# Patient Record
Sex: Female | Born: 1955 | Race: White | Hispanic: No | Marital: Single | Smoking: Current every day smoker
Health system: Southern US, Community
[De-identification: ages and names within clinical notes are randomized; demographics above are authoritative.]

## PROBLEM LIST (undated history)

## (undated) DIAGNOSIS — E079 Disorder of thyroid, unspecified: Secondary | ICD-10-CM

## (undated) DIAGNOSIS — R42 Dizziness and giddiness: Secondary | ICD-10-CM

## (undated) DIAGNOSIS — N200 Calculus of kidney: Secondary | ICD-10-CM

## (undated) DIAGNOSIS — Z789 Other specified health status: Secondary | ICD-10-CM

## (undated) DIAGNOSIS — F419 Anxiety disorder, unspecified: Secondary | ICD-10-CM

## (undated) HISTORY — DX: Disorder of thyroid, unspecified: E07.9

---

## 1979-03-07 HISTORY — PX: AUGMENTATION MAMMAPLASTY: SUR837

## 2004-03-06 HISTORY — PX: LAPAROSCOPIC TOTAL HYSTERECTOMY: SUR800

## 2005-03-06 HISTORY — PX: BREAST IMPLANT REMOVAL: SUR1101

## 2008-03-06 HISTORY — PX: CHOLECYSTECTOMY: SHX55

## 2017-10-29 ENCOUNTER — Other Ambulatory Visit: Payer: Self-pay | Admitting: Family

## 2017-10-29 DIAGNOSIS — Z1231 Encounter for screening mammogram for malignant neoplasm of breast: Secondary | ICD-10-CM

## 2017-11-15 ENCOUNTER — Ambulatory Visit
Admission: RE | Admit: 2017-11-15 | Discharge: 2017-11-15 | Disposition: A | Discharge status: Home / Self Care | Payer: BLUE CROSS/BLUE SHIELD | Source: Ambulatory Visit | Attending: Family | Admitting: Family

## 2017-11-15 DIAGNOSIS — Z1231 Encounter for screening mammogram for malignant neoplasm of breast: Secondary | ICD-10-CM

## 2017-11-22 ENCOUNTER — Other Ambulatory Visit: Payer: Self-pay | Admitting: Family

## 2017-11-22 DIAGNOSIS — R928 Other abnormal and inconclusive findings on diagnostic imaging of breast: Secondary | ICD-10-CM

## 2017-12-06 ENCOUNTER — Ambulatory Visit
Admission: RE | Admit: 2017-12-06 | Discharge: 2017-12-06 | Disposition: A | Discharge status: Home / Self Care | Payer: BLUE CROSS/BLUE SHIELD | Source: Ambulatory Visit | Attending: Family | Admitting: Family

## 2017-12-06 DIAGNOSIS — R928 Other abnormal and inconclusive findings on diagnostic imaging of breast: Secondary | ICD-10-CM

## 2018-11-03 IMAGING — US US BREAST*L* LIMITED INC AXILLA
1 series · 2 of 2 positions shown · non-contrast
Comparison: Previous exam(s).

CLINICAL DATA: Patient recalled from screening for bilateral
distortion. Note patient had remote history of bilateral silicone
implants which ruptured and were subsequently removed. Patient does
not have any outside prior images for comparison.

EXAM:
DIGITAL DIAGNOSTIC BILATERAL MAMMOGRAM WITH CAD
ULTRASOUND BILATERAL BREAST

[Series 1: us breast*left* limited inc axilla · 0.07mm/px · 2 of 2 slices shown]
[im 1/2]
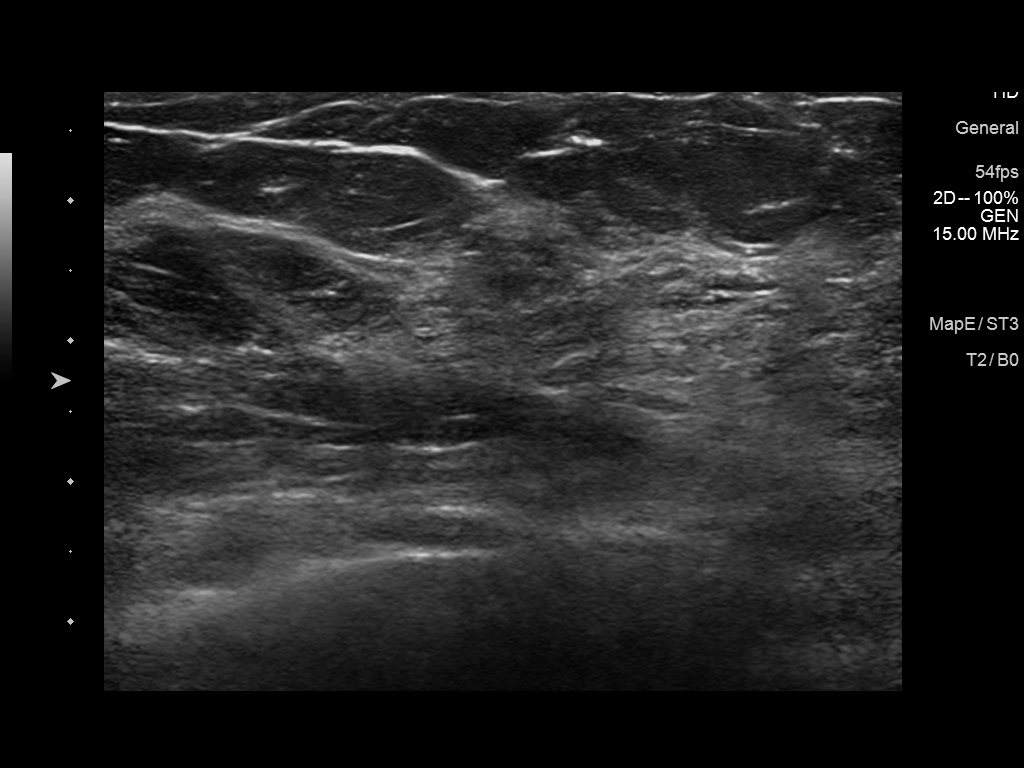
[im 2/2]
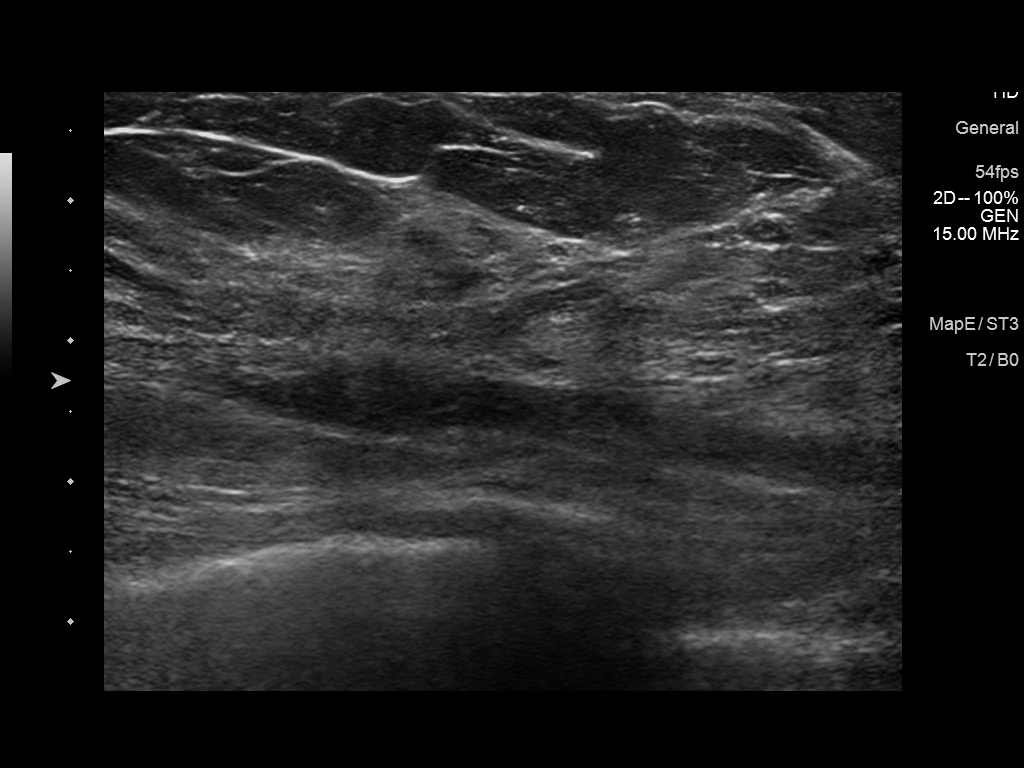

[2 of 2 positions shown; findings below may reference images not displayed]

ACR Breast Density Category c: The breast tissue is heterogeneously
dense, which may obscure small masses.
FINDINGS: Additional imaging demonstrates persistent distortion within the
central posterior right breast. Multiple high-density circumscribed
masses are demonstrated within this focal area of distortion.

Within the central posterior left breast there is persistent
distortion. There are multiple tubular high density masses within
the posterior aspect of the left breast.

Mammographic images were processed with CAD.

Targeted ultrasound is performed, showing distortion within the
right breast 12 o'clock position 2 cm from nipple. There are oval
circumscribed hypoechoic masses located centrally within the area of
distortion. Additionally, there are scattered areas of increased
echogenicity compatible with silicone.

Within the left breast 2 o'clock position there is small amount of
sonographically identified distortion. Throughout the area there is
increased echogenicity compatible with silicone.
IMPRESSION: Complex findings within the right and left breast favored to
represent a combination of postsurgical changes and residual
extracapsular silicone.

RECOMMENDATION:
Given the complexity of the imaging appearance of both the right and
left breast, history of silicone rupture and postsurgical changes,
recommend bilateral diagnostic mammography in 6 months to ensure
stability of these findings. Today's exam will serve as a baseline
and if there are any concerning changes, these can be acted on at
the time the six-month follow-up.

I have discussed the findings and recommendations with the patient.
Results were also provided in writing at the conclusion of the
visit. If applicable, a reminder letter will be sent to the patient
regarding the next appointment.

BI-RADS CATEGORY  3: Probably benign.

## 2018-11-03 IMAGING — US US BREAST*R* LIMITED INC AXILLA
1 series · 2 of 2 positions shown · non-contrast
Comparison: Previous exam(s).

CLINICAL DATA: Patient recalled from screening for bilateral
distortion. Note patient had remote history of bilateral silicone
implants which ruptured and were subsequently removed. Patient does
not have any outside prior images for comparison.

EXAM:
DIGITAL DIAGNOSTIC BILATERAL MAMMOGRAM WITH CAD
ULTRASOUND BILATERAL BREAST

[Series 1: us breast*right* limited inc axilla · 0.09mm/px · 2 of 2 slices shown]
[im 1/2]
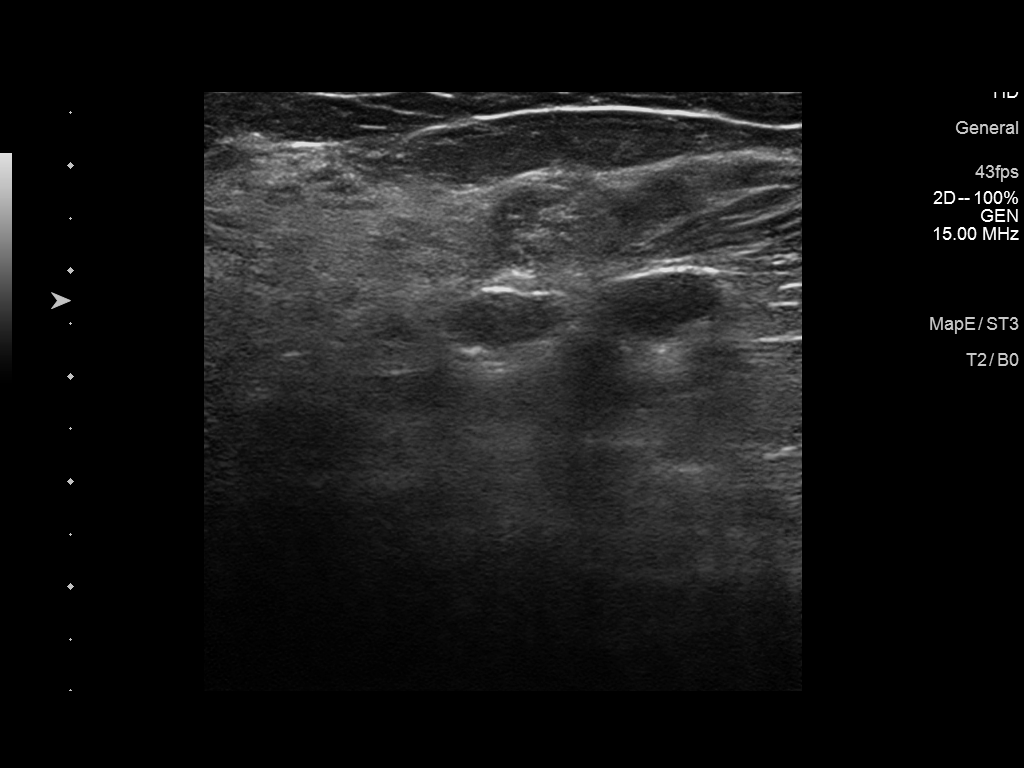
[im 2/2]
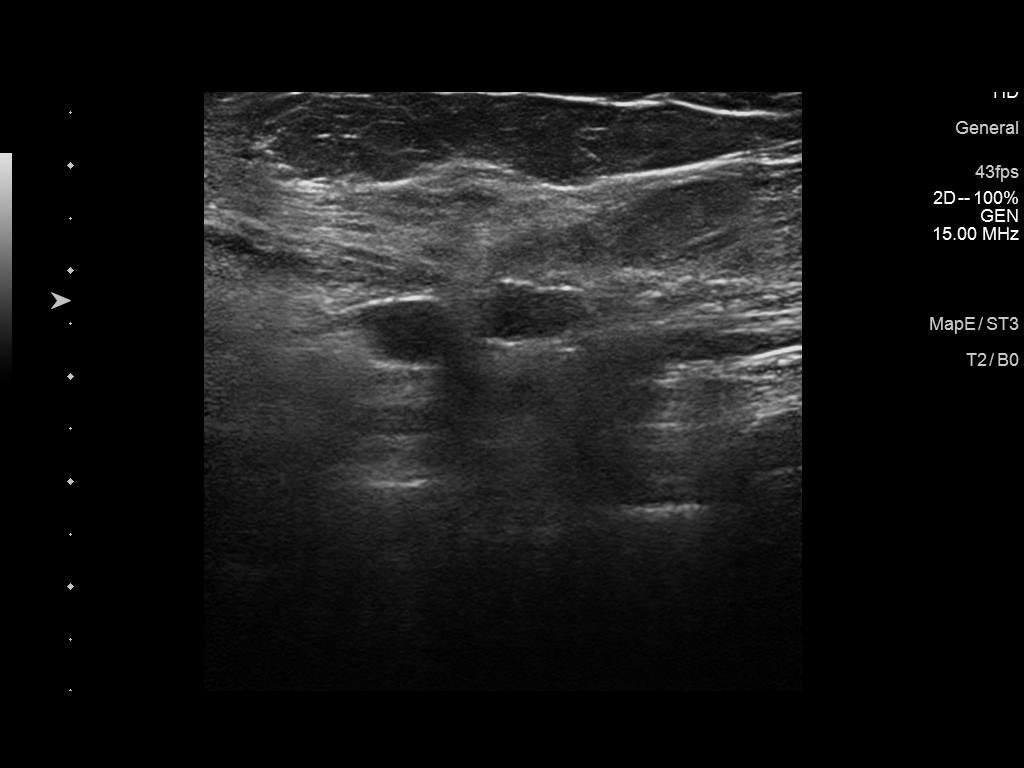

[2 of 2 positions shown; findings below may reference images not displayed]

ACR Breast Density Category c: The breast tissue is heterogeneously
dense, which may obscure small masses.
FINDINGS: Additional imaging demonstrates persistent distortion within the
central posterior right breast. Multiple high-density circumscribed
masses are demonstrated within this focal area of distortion.

Within the central posterior left breast there is persistent
distortion. There are multiple tubular high density masses within
the posterior aspect of the left breast.

Mammographic images were processed with CAD.

Targeted ultrasound is performed, showing distortion within the
right breast 12 o'clock position 2 cm from nipple. There are oval
circumscribed hypoechoic masses located centrally within the area of
distortion. Additionally, there are scattered areas of increased
echogenicity compatible with silicone.

Within the left breast 2 o'clock position there is small amount of
sonographically identified distortion. Throughout the area there is
increased echogenicity compatible with silicone.
IMPRESSION: Complex findings within the right and left breast favored to
represent a combination of postsurgical changes and residual
extracapsular silicone.

RECOMMENDATION:
Given the complexity of the imaging appearance of both the right and
left breast, history of silicone rupture and postsurgical changes,
recommend bilateral diagnostic mammography in 6 months to ensure
stability of these findings. Today's exam will serve as a baseline
and if there are any concerning changes, these can be acted on at
the time the six-month follow-up.

I have discussed the findings and recommendations with the patient.
Results were also provided in writing at the conclusion of the
visit. If applicable, a reminder letter will be sent to the patient
regarding the next appointment.

BI-RADS CATEGORY  3: Probably benign.

## 2019-07-04 ENCOUNTER — Other Ambulatory Visit: Payer: Self-pay | Admitting: Family

## 2019-07-04 DIAGNOSIS — Z1231 Encounter for screening mammogram for malignant neoplasm of breast: Secondary | ICD-10-CM

## 2019-07-04 DIAGNOSIS — R928 Other abnormal and inconclusive findings on diagnostic imaging of breast: Secondary | ICD-10-CM

## 2019-07-04 DIAGNOSIS — N6489 Other specified disorders of breast: Secondary | ICD-10-CM

## 2021-03-11 ENCOUNTER — Ambulatory Visit: Payer: BLUE CROSS/BLUE SHIELD | Admitting: Nurse Practitioner

## 2021-03-17 ENCOUNTER — Telehealth: Payer: Self-pay

## 2021-03-17 NOTE — Telephone Encounter (Signed)
Left vm and sent mychart message to confirm 03/18/21 appointment-Toni

## 2021-03-18 ENCOUNTER — Other Ambulatory Visit: Payer: Self-pay

## 2021-03-18 ENCOUNTER — Ambulatory Visit (INDEPENDENT_AMBULATORY_CARE_PROVIDER_SITE_OTHER): Payer: Medicare Other | Admitting: Physician Assistant

## 2021-03-18 ENCOUNTER — Encounter: Payer: Self-pay | Admitting: Physician Assistant

## 2021-03-18 DIAGNOSIS — G4700 Insomnia, unspecified: Secondary | ICD-10-CM | POA: Diagnosis not present

## 2021-03-18 DIAGNOSIS — R5383 Other fatigue: Secondary | ICD-10-CM

## 2021-03-18 DIAGNOSIS — Z7689 Persons encountering health services in other specified circumstances: Secondary | ICD-10-CM

## 2021-03-18 DIAGNOSIS — F411 Generalized anxiety disorder: Secondary | ICD-10-CM

## 2021-03-18 DIAGNOSIS — Z1231 Encounter for screening mammogram for malignant neoplasm of breast: Secondary | ICD-10-CM

## 2021-03-18 DIAGNOSIS — E2839 Other primary ovarian failure: Secondary | ICD-10-CM

## 2021-03-18 DIAGNOSIS — R7989 Other specified abnormal findings of blood chemistry: Secondary | ICD-10-CM

## 2021-03-18 DIAGNOSIS — E559 Vitamin D deficiency, unspecified: Secondary | ICD-10-CM

## 2021-03-18 NOTE — Progress Notes (Signed)
[facility] [place], [place] [address]  Internal MEDICINE  Office Visit Note  Patient Name: Tracy Baker  [id number]  [id number]  Date of Service: 03/22/2021   Complaints/HPI Pt is here for establishment of PCP. Chief Complaint  Patient presents with   New Patient (Initial Visit)   Thyroid Problem    Is concerned that thyroid is causing weight gain   HPI Pt is here to establish care -Last PCP about 2 yrs ago -Hx of thyroid being "off" -Has been gaining weight, fatigued, and has some anxiety. Has been on klonopin previously and only would take as needed. Took effexor previously and stopepd around 2008. Once per month has these anxiety spells and feels she is managing. -Sleep has been poor, sometimes does nyquil or klonopin -Has done melatonin gummies but took right before bed and was groggy next morning--may try taking a few hours prior to bed.  -Works at [employer] and is on her feet, does this parttime. Spends time with grandduaghter on weekends, Has a puppy as well.  -She has 2 daughters and 1 son that check up on her frequently -Never had a colonoscopy, report she does have an uncle that had colon cancer, she is not interested in a referral for this at this time and will discuss again in future -Had a total hysterectomy, and her last mammogram was 2 yrs ago -She does smoke 1ppd  Current Medication: No outpatient encounter medications on file as of 03/18/2021.   No facility-administered encounter medications on file as of 03/18/2021.    Surgical History: Past Surgical History:  Procedure Laterality Date   AUGMENTATION MAMMAPLASTY Bilateral 1981   removed in 2006   BREAST IMPLANT REMOVAL Bilateral 2007   CHOLECYSTECTOMY  2010   LAPAROSCOPIC TOTAL HYSTERECTOMY  2006    Medical History: Past Medical History:  Diagnosis Date   Thyroid disease     Family History: Family History  Problem Relation Age of Onset   Hypertension Mother     Diabetes Father    Breast cancer Neg Hx     Social History   Socioeconomic History   Marital status: Single    Spouse name: Not on file   Number of children: Not on file   Years of education: Not on file   Highest education level: Not on file  Occupational History   Not on file  Tobacco Use   Smoking status: Every Day    Packs/day: 1.00    Types: Cigarettes   Smokeless tobacco: Not on file   Tobacco comments:    About 15-20 cigarettes a day  Substance and Sexual Activity   Alcohol use: Yes   Drug use: Never   Sexual activity: Not Currently  Other Topics Concern   Not on file  Social History Narrative   Not on file   Social Determinants of Health   Financial Resource Strain: Not on file  Food Insecurity: Not on file  Transportation Needs: Not on file  Physical Activity: Not on file  Stress: Not on file  Social Connections: Not on file  Intimate Partner Violence: Not on file     Review of Systems  Constitutional:  Positive for fatigue and unexpected weight change. Negative for chills.  HENT:  Negative for congestion, postnasal drip, rhinorrhea, sneezing and sore throat.   Eyes:  Negative for redness.  Respiratory:  Negative for cough, chest tightness and shortness of breath.   Cardiovascular:  Negative for chest pain and palpitations.  Gastrointestinal:  Negative for abdominal pain, constipation, diarrhea, nausea and vomiting.  Genitourinary:  Negative for dysuria and frequency.  Musculoskeletal:  Negative for arthralgias, back pain, joint swelling and neck pain.  Skin:  Negative for rash.  Neurological: Negative.  Negative for tremors and numbness.  Hematological:  Negative for adenopathy. Does not bruise/bleed easily.  Psychiatric/Behavioral:  Positive for sleep disturbance. Negative for behavioral problems (Depression) and suicidal ideas. The patient is nervous/anxious.    Vital Signs: BP 128/64 Comment: 168/78   Pulse 68    Temp 98 F (36.7 C)    Resp 16     Ht 5\' 4"  (1.626 m)    Wt 166 lb (75.3 kg)    SpO2 98%    BMI 28.49 kg/m    Physical Exam Vitals and nursing note reviewed.  Constitutional:      General: She is not in acute distress.    Appearance: She is well-developed. She is not diaphoretic.  HENT:     Head: Normocephalic and atraumatic.     Mouth/Throat:     Pharynx: No oropharyngeal exudate.  Eyes:     Pupils: Pupils are equal, round, and reactive to light.  Neck:     Thyroid: No thyromegaly.     Vascular: No JVD.     Trachea: No tracheal deviation.  Cardiovascular:     Rate and Rhythm: Normal rate and regular rhythm.     Heart sounds: Normal heart sounds. No murmur heard.   No friction rub. No gallop.  Pulmonary:     Effort: Pulmonary effort is normal. No respiratory distress.     Breath sounds: No wheezing or rales.  Chest:     Chest wall: No tenderness.  Abdominal:     General: Bowel sounds are normal.     Palpations: Abdomen is soft.  Musculoskeletal:        General: Normal range of motion.     Cervical back: Normal range of motion and neck supple.  Lymphadenopathy:     Cervical: No cervical adenopathy.  Skin:    General: Skin is warm and dry.  Neurological:     Mental Status: She is alert and oriented to person, place, and time.     Cranial Nerves: No cranial nerve deficit.  Psychiatric:        Behavior: Behavior normal.        Thought Content: Thought content normal.        Judgment: Judgment normal.      Assessment/Plan: 1. GAD (generalized anxiety disorder) Previously took Brewing technologist and klonopin but states she is doing ok without medication now. Does have left over klonopin to take if really needed, but is managing for now.  2. Insomnia, unspecified type Will try melatonin a few hours prior to bed  3. Primary ovarian failure - DG Bone Density; Future  4. Encounter for screening mammogram for breast cancer - MM Digital Screening; Future  5. Abnormal thyroid blood test - TSH + free T4  6.  Vitamin D deficiency - VITAMIN D 25 Hydroxy (Vit-D Deficiency, Fractures)  7. Other fatigue - CBC w/Diff/Platelet - Comprehensive metabolic panel - Lipid Panel With LDL/HDL Ratio - TSH + free T4  8. Encounter to establish care with new doctor - CBC w/Diff/Platelet - Comprehensive metabolic panel - Lipid Panel With LDL/HDL Ratio - TSH + free T4 - VITAMIN D 25 Hydroxy (Vit-D Deficiency, Fractures)  General Counseling: Levon Hedger understanding of the findings of todays visit and agrees with plan of treatment. I  have discussed any further diagnostic evaluation that may be needed or ordered today. We also reviewed her medications today. she has been encouraged to call the office with any questions or concerns that should arise related to todays visit.    Counseling:    Orders Placed This Encounter  Procedures   MM Digital Screening   DG Bone Density   CBC w/Diff/Platelet   Comprehensive metabolic panel   Lipid Panel With LDL/HDL Ratio   TSH + free T4   VITAMIN D 25 Hydroxy (Vit-D Deficiency, Fractures)    No orders of the defined types were placed in this encounter.    This patient was seen by Drema Dallas, PA-C in collaboration with Dr. Clayborn Bigness as a part of collaborative care agreement.   Time spent:35 Minutes

## 2021-03-19 LAB — CBC WITH DIFFERENTIAL/PLATELET
Basophils Absolute: 0 10*3/uL (ref 0.0–0.2)
Basos: 0 %
EOS (ABSOLUTE): 0.1 10*3/uL (ref 0.0–0.4)
Eos: 1 %
Hematocrit: 46.5 % (ref 34.0–46.6)
Hemoglobin: 15.9 g/dL (ref 11.1–15.9)
Immature Grans (Abs): 0 10*3/uL (ref 0.0–0.1)
Immature Granulocytes: 0 %
Lymphocytes Absolute: 2.4 10*3/uL (ref 0.7–3.1)
Lymphs: 35 %
MCH: 31.8 pg (ref 26.6–33.0)
MCHC: 34.2 g/dL (ref 31.5–35.7)
MCV: 93 fL (ref 79–97)
Monocytes Absolute: 0.4 10*3/uL (ref 0.1–0.9)
Monocytes: 6 %
Neutrophils Absolute: 4 10*3/uL (ref 1.4–7.0)
Neutrophils: 58 %
Platelets: 210 10*3/uL (ref 150–450)
RBC: 5 x10E6/uL (ref 3.77–5.28)
RDW: 12.5 % (ref 11.7–15.4)
WBC: 7 10*3/uL (ref 3.4–10.8)

## 2021-03-19 LAB — COMPREHENSIVE METABOLIC PANEL
ALT: 48 IU/L — ABNORMAL HIGH (ref 0–32)
AST: 24 IU/L (ref 0–40)
Albumin/Globulin Ratio: 1.8 (ref 1.2–2.2)
Albumin: 4.6 g/dL (ref 3.8–4.8)
Alkaline Phosphatase: 148 IU/L — ABNORMAL HIGH (ref 44–121)
BUN/Creatinine Ratio: 35 — ABNORMAL HIGH (ref 12–28)
BUN: 21 mg/dL (ref 8–27)
Bilirubin Total: 0.5 mg/dL (ref 0.0–1.2)
CO2: 19 mmol/L — ABNORMAL LOW (ref 20–29)
Calcium: 9.3 mg/dL (ref 8.7–10.3)
Chloride: 102 mmol/L (ref 96–106)
Creatinine, Ser: 0.6 mg/dL (ref 0.57–1.00)
Globulin, Total: 2.5 g/dL (ref 1.5–4.5)
Glucose: 110 mg/dL — ABNORMAL HIGH (ref 70–99)
Potassium: 4.5 mmol/L (ref 3.5–5.2)
Sodium: 138 mmol/L (ref 134–144)
Total Protein: 7.1 g/dL (ref 6.0–8.5)
eGFR: 100 mL/min/{1.73_m2} (ref >59)

## 2021-03-19 LAB — LIPID PANEL WITH LDL/HDL RATIO
Cholesterol, Total: 254 mg/dL — ABNORMAL HIGH (ref 100–199)
HDL: 58 mg/dL (ref >39)
LDL Chol Calc (NIH): 172 mg/dL — ABNORMAL HIGH (ref 0–99)
LDL/HDL Ratio: 3 ratio (ref 0.0–3.2)
Triglycerides: 133 mg/dL (ref 0–149)
VLDL Cholesterol Cal: 24 mg/dL (ref 5–40)

## 2021-03-19 LAB — TSH+FREE T4
Free T4: 1.34 ng/dL (ref 0.82–1.77)
TSH: 1.79 u[IU]/mL (ref 0.450–4.500)

## 2021-03-19 LAB — VITAMIN D 25 HYDROXY (VIT D DEFICIENCY, FRACTURES): Vit D, 25-Hydroxy: 13 ng/mL — ABNORMAL LOW (ref 30.0–100.0)

## 2021-03-29 ENCOUNTER — Other Ambulatory Visit: Payer: Self-pay | Admitting: Internal Medicine

## 2021-03-29 DIAGNOSIS — R7989 Other specified abnormal findings of blood chemistry: Secondary | ICD-10-CM

## 2021-03-29 NOTE — Progress Notes (Signed)
Us/

## 2021-03-30 ENCOUNTER — Other Ambulatory Visit: Payer: Self-pay

## 2021-03-30 ENCOUNTER — Ambulatory Visit (INDEPENDENT_AMBULATORY_CARE_PROVIDER_SITE_OTHER): Payer: Medicare Other

## 2021-03-30 DIAGNOSIS — R7989 Other specified abnormal findings of blood chemistry: Secondary | ICD-10-CM

## 2021-03-31 ENCOUNTER — Telehealth: Payer: Self-pay

## 2021-03-31 ENCOUNTER — Other Ambulatory Visit: Payer: Self-pay

## 2021-03-31 MED ORDER — ERGOCALCIFEROL 1.25 MG (50000 UT) PO CAPS: 50000.0000 [IU] | ORAL_CAPSULE | ORAL | 5 refills | Status: DC

## 2021-03-31 NOTE — Telephone Encounter (Signed)
Pt notified for result and advised vitamin D was lod send pres for drisdol once a week for 6 month and follow diet for chol and we will check A1c next visit and pt was fasting when she did labs

## 2021-03-31 NOTE — Telephone Encounter (Signed)
-----   Message from Mylinda Latina, PA-C sent at 03/31/2021  1:31 PM EST ----- Please send drisdol, advise her cholesterol is high and she has elevated LFTs--will check abdominal US to evaluate and then may need med for cholesterol that we can discuss next visit. Work on diet/exercise for now

## 2021-03-31 NOTE — Telephone Encounter (Signed)
Lmom that vitamin D is low send drisdol please call us back like to discuss in detail

## 2021-04-27 ENCOUNTER — Telehealth: Payer: Self-pay

## 2021-04-27 NOTE — Telephone Encounter (Signed)
LMOM that its a little too soon to recheck and Lauren can reorder blood work in 1 to 2 months

## 2021-05-05 ENCOUNTER — Other Ambulatory Visit: Payer: Self-pay | Admitting: Physician Assistant

## 2021-05-05 DIAGNOSIS — N6489 Other specified disorders of breast: Secondary | ICD-10-CM

## 2021-05-16 ENCOUNTER — Encounter: Payer: Self-pay | Admitting: Physician Assistant

## 2021-05-16 ENCOUNTER — Other Ambulatory Visit: Payer: Self-pay

## 2021-05-16 ENCOUNTER — Ambulatory Visit (INDEPENDENT_AMBULATORY_CARE_PROVIDER_SITE_OTHER): Payer: Medicare Other | Admitting: Physician Assistant

## 2021-05-16 DIAGNOSIS — Z0001 Encounter for general adult medical examination with abnormal findings: Secondary | ICD-10-CM

## 2021-05-16 DIAGNOSIS — H6123 Impacted cerumen, bilateral: Secondary | ICD-10-CM

## 2021-05-16 DIAGNOSIS — K760 Fatty (change of) liver, not elsewhere classified: Secondary | ICD-10-CM

## 2021-05-16 DIAGNOSIS — R0989 Other specified symptoms and signs involving the circulatory and respiratory systems: Secondary | ICD-10-CM | POA: Diagnosis not present

## 2021-05-16 DIAGNOSIS — E782 Mixed hyperlipidemia: Secondary | ICD-10-CM | POA: Diagnosis not present

## 2021-05-16 DIAGNOSIS — R7309 Other abnormal glucose: Secondary | ICD-10-CM | POA: Diagnosis not present

## 2021-05-16 DIAGNOSIS — D229 Melanocytic nevi, unspecified: Secondary | ICD-10-CM

## 2021-05-16 DIAGNOSIS — F411 Generalized anxiety disorder: Secondary | ICD-10-CM

## 2021-05-16 DIAGNOSIS — Z1212 Encounter for screening for malignant neoplasm of rectum: Secondary | ICD-10-CM

## 2021-05-16 DIAGNOSIS — Z1283 Encounter for screening for malignant neoplasm of skin: Secondary | ICD-10-CM

## 2021-05-16 DIAGNOSIS — R300 Dysuria: Secondary | ICD-10-CM

## 2021-05-16 DIAGNOSIS — E559 Vitamin D deficiency, unspecified: Secondary | ICD-10-CM

## 2021-05-16 DIAGNOSIS — Z1211 Encounter for screening for malignant neoplasm of colon: Secondary | ICD-10-CM

## 2021-05-16 LAB — POCT GLYCOSYLATED HEMOGLOBIN (HGB A1C): Hemoglobin A1C: 5.8 % — AB (ref 4.0–5.6)

## 2021-05-16 NOTE — Patient Instructions (Signed)

## 2021-05-16 NOTE — Progress Notes (Cosign Needed)
[facility] [place], [place] [address]  Internal MEDICINE  Office Visit Note  Patient Name: Tracy Baker  [id number]  [id number]  Date of Service: 05/16/2021  Chief Complaint  Patient presents with   Medicare Wellness   Ear Problem    Needs right ear flushed     HPI Pt is here for routine health maintenance examination -Labs previously reviewed over the phone--low Vit D, elevated cholesterol, elevated LFTs, and glucose -Has been working on eating healthier and has lost some weight. Has not started fish oil, but is eating salmon. Increasing water intake -she does not like to take medication, but is willing ot take supplement to hlep potentially avoid script. Understands it may be necessary though. Will check carotid US for further evaluation given elevated choelsterol -Reviewed abdomen US which showed simple cyst of right kidney and mild fatty liver disease. Again discussed diet changes -A1c checked in office due to elevated fasting glucose and did put her in prediabetic range therefore further diet recommendations given and will monitor -She is taking vitamin D supplement as prescribed -She has a few moles that need to be checked and will be referred to dermatology for full skin check -Mammogram will be scheduled, also due for colonoscopy -ear wax in both ears, muffled hearing, requests irrigation -continues to have some stress and anxiety but is managing  Current Medication: Outpatient Encounter Medications as of 05/16/2021  Medication Sig   ergocalciferol (VITAMIN D2) 1.25 MG (50000 UT) capsule Take 1 capsule (50,000 Units total) by mouth once a week.   No facility-administered encounter medications on file as of 05/16/2021.    Surgical History: Past Surgical History:  Procedure Laterality Date   AUGMENTATION MAMMAPLASTY Bilateral 1981   removed in 2006   BREAST IMPLANT REMOVAL Bilateral 2007   CHOLECYSTECTOMY  2010   LAPAROSCOPIC TOTAL  HYSTERECTOMY  2006    Medical History: Past Medical History:  Diagnosis Date   Thyroid disease     Family History: Family History  Problem Relation Age of Onset   Hypertension Mother    Diabetes Father    Breast cancer Neg Hx       Review of Systems  Constitutional:  Negative for chills, fatigue and unexpected weight change.  HENT:  Negative for congestion, postnasal drip, rhinorrhea, sneezing and sore throat.   Eyes:  Negative for redness.  Respiratory:  Negative for cough, chest tightness and shortness of breath.   Cardiovascular:  Negative for chest pain and palpitations.  Gastrointestinal:  Negative for abdominal pain, constipation, diarrhea, nausea and vomiting.  Genitourinary:  Negative for dysuria and frequency.  Musculoskeletal:  Negative for arthralgias, back pain, joint swelling and neck pain.  Skin:  Negative for rash.       Moles/skin tags  Neurological: Negative.  Negative for tremors and numbness.  Hematological:  Negative for adenopathy. Does not bruise/bleed easily.  Psychiatric/Behavioral:  Negative for behavioral problems (Depression), sleep disturbance and suicidal ideas. The patient is nervous/anxious.     Vital Signs: BP 132/86    Pulse 67    Temp 98.3 F (36.8 C)    Resp 16    Ht 5' 4" (1.626 m)    Wt 160 lb 9.6 oz (72.8 kg)    SpO2 98%    BMI 27.57 kg/m    Physical Exam Vitals and nursing note reviewed.  Constitutional:      General: She is not in acute distress.    Appearance: She is well-developed. She  is not diaphoretic.  HENT:     Head: Normocephalic and atraumatic.     Right Ear: There is impacted cerumen.     Left Ear: There is impacted cerumen.     Mouth/Throat:     Pharynx: No oropharyngeal exudate.  Eyes:     Pupils: Pupils are equal, round, and reactive to light.  Neck:     Thyroid: No thyromegaly.     Vascular: No JVD.     Trachea: No tracheal deviation.  Cardiovascular:     Rate and Rhythm: Normal rate and regular rhythm.      Heart sounds: Normal heart sounds. No murmur heard.   No friction rub. No gallop.  Pulmonary:     Effort: Pulmonary effort is normal. No respiratory distress.     Breath sounds: No wheezing or rales.  Chest:     Chest wall: No tenderness.  Breasts:    Right: Normal. No mass.     Left: Normal. No mass.  Abdominal:     General: Bowel sounds are normal.     Palpations: Abdomen is soft.     Tenderness: There is no abdominal tenderness.  Musculoskeletal:        General: Normal range of motion.     Cervical back: Normal range of motion and neck supple.  Lymphadenopathy:     Cervical: No cervical adenopathy.  Skin:    General: Skin is warm and dry.     Findings: Lesion present.     Comments: Multiple dark nevi and skin tags  Neurological:     Mental Status: She is alert and oriented to person, place, and time.     Cranial Nerves: No cranial nerve deficit.  Psychiatric:        Behavior: Behavior normal.        Thought Content: Thought content normal.        Judgment: Judgment normal.     LABS: Recent Results (from the past 2160 hour(s))  CBC w/Diff/Platelet     Status: None   Collection Time: 03/18/21 10:40 AM  Result Value Ref Range   WBC 7.0 3.4 - 10.8 x10E3/uL   RBC 5.00 3.77 - 5.28 x10E6/uL   Hemoglobin 15.9 11.1 - 15.9 g/dL   Hematocrit 46.5 34.0 - 46.6 %   MCV 93 79 - 97 fL   MCH 31.8 26.6 - 33.0 pg   MCHC 34.2 31.5 - 35.7 g/dL   RDW 12.5 11.7 - 15.4 %   Platelets 210 150 - 450 x10E3/uL   Neutrophils 58 Not Estab. %   Lymphs 35 Not Estab. %   Monocytes 6 Not Estab. %   Eos 1 Not Estab. %   Basos 0 Not Estab. %   Neutrophils Absolute 4.0 1.4 - 7.0 x10E3/uL   Lymphocytes Absolute 2.4 0.7 - 3.1 x10E3/uL   Monocytes Absolute 0.4 0.1 - 0.9 x10E3/uL   EOS (ABSOLUTE) 0.1 0.0 - 0.4 x10E3/uL   Basophils Absolute 0.0 0.0 - 0.2 x10E3/uL   Immature Granulocytes 0 Not Estab. %   Immature Grans (Abs) 0.0 0.0 - 0.1 x10E3/uL  Comprehensive metabolic panel     Status:  Abnormal   Collection Time: 03/18/21 10:40 AM  Result Value Ref Range   Glucose 110 (H) 70 - 99 mg/dL   BUN 21 8 - 27 mg/dL   Creatinine, Ser 0.60 0.57 - 1.00 mg/dL   eGFR 100 >59 mL/min/1.73   BUN/Creatinine Ratio 35 (H) 12 - 28   Sodium 138 134 -  144 mmol/L   Potassium 4.5 3.5 - 5.2 mmol/L   Chloride 102 96 - 106 mmol/L   CO2 19 (L) 20 - 29 mmol/L   Calcium 9.3 8.7 - 10.3 mg/dL   Total Protein 7.1 6.0 - 8.5 g/dL   Albumin 4.6 3.8 - 4.8 g/dL   Globulin, Total 2.5 1.5 - 4.5 g/dL   Albumin/Globulin Ratio 1.8 1.2 - 2.2   Bilirubin Total 0.5 0.0 - 1.2 mg/dL   Alkaline Phosphatase 148 (H) 44 - 121 IU/L   AST 24 0 - 40 IU/L   ALT 48 (H) 0 - 32 IU/L  Lipid Panel With LDL/HDL Ratio     Status: Abnormal   Collection Time: 03/18/21 10:40 AM  Result Value Ref Range   Cholesterol, Total 254 (H) 100 - 199 mg/dL   Triglycerides 133 0 - 149 mg/dL   HDL 58 >39 mg/dL   VLDL Cholesterol Cal 24 5 - 40 mg/dL   LDL Chol Calc (NIH) 172 (H) 0 - 99 mg/dL   LDL/HDL Ratio 3.0 0.0 - 3.2 ratio    Comment:                                     LDL/HDL Ratio                                             Men  Women                               1/2 Avg.Risk  1.0    1.5                                   Avg.Risk  3.6    3.2                                2X Avg.Risk  6.2    5.0                                3X Avg.Risk  8.0    6.1   TSH + free T4     Status: None   Collection Time: 03/18/21 10:40 AM  Result Value Ref Range   TSH 1.790 0.450 - 4.500 uIU/mL   Free T4 1.34 0.82 - 1.77 ng/dL  VITAMIN D 25 Hydroxy (Vit-D Deficiency, Fractures)     Status: Abnormal   Collection Time: 03/18/21 10:40 AM  Result Value Ref Range   Vit D, 25-Hydroxy 13.0 (L) 30.0 - 100.0 ng/mL    Comment: Vitamin D deficiency has been defined by the [place] practice guideline as a level of serum 25-OH vitamin D less than 20 ng/mL (1,2). The Endocrine Society went on to further define  vitamin D insufficiency as a level between 21 and 29 ng/mL (2). 1. IOM (Institute of Medicine). 2010. Dietary reference    intakes for calcium and D. [place]: The    [employer]. 2. Holick MF, Binkley [place], Bischoff-Ferrari HA, et al.  Evaluation, treatment, and prevention of vitamin D    deficiency: an Endocrine Society clinical practice    guideline. JCEM. 2011 Jul; 96(7):1911-30.   POCT HgB A1C     Status: Abnormal   Collection Time: 05/16/21 10:06 AM  Result Value Ref Range   Hemoglobin A1C 5.8 (A) 4.0 - 5.6 %   HbA1c POC (<> result, manual entry)     HbA1c, POC (prediabetic range)     HbA1c, POC (controlled diabetic range)          Assessment/Plan: 1. Encounter for general adult medical examination with abnormal findings CPE performed, routine fasting labs reviewed, will schedule mammogram and colonoscopy as well as bone mineral density  2. Elevated glucose - POCT HgB A1C is 5.8 which is in prediabetic range.  Discussed working on diet and exercise and will continue to monitor   3. Mixed hyperlipidemia patient has been working on improving diet and exercise and will continue to do so.  Also advised to start on fish oil supplement.  Patient is against starting medication at this time.  We will go ahead and check carotid ultrasound for further evaluation and may consider medication in the future.  We will plan for rechecking lipid panel at next visit  4. Fatty liver Seen on ultrasound ordered due to elevated LFTs.  Advised to work on diet and exercise and start fish oil supplement to help with lowering cholesterol  5. Bilateral carotid bruits We will check carotid ultrasound - US Carotid Duplex Bilateral; Future  6. GAD (generalized anxiety disorder) Continues to have some increase stress and anxiety but is managing.  Contact office if any worsening  7. Atypical mole Refer to dermatology for full skin check especially for evaluation of multiple dark  moles - Ambulatory referral to Dermatology  8. Vitamin D deficiency Continue Drisdol  9. Skin exam, screening for cancer Refer to dermatology for full skin check specially for evaluation of multiple dark moles - Ambulatory referral to Dermatology  10. Screening for colorectal cancer - Ambulatory referral to Gastroenterology  11. Impacted cerumen of both ears - Ear Lavage  12. Dysuria - UA/M w/rflx Culture, Routine   General Counseling: lorry furber understanding of the findings of todays visit and agrees with plan of treatment. I have discussed any further diagnostic evaluation that may be needed or ordered today. We also reviewed her medications today. she has been encouraged to call the office with any questions or concerns that should arise related to todays visit.    Counseling:    Orders Placed This Encounter  Procedures   US Carotid Duplex Bilateral   UA/M w/rflx Culture, Routine   Ambulatory referral to Dermatology   Ambulatory referral to Gastroenterology   POCT HgB A1C   Ear Lavage    No orders of the defined types were placed in this encounter.   This patient was seen by Drema Dallas, PA-C in collaboration with Dr. Clayborn Bigness as a part of collaborative care agreement.  Total time spent:40 Minutes  Time spent includes review of chart, medications, test results, and follow up plan with the patient.     Lavera Guise, MD  Internal Medicine

## 2021-05-17 ENCOUNTER — Other Ambulatory Visit: Payer: Self-pay

## 2021-05-17 DIAGNOSIS — Z1211 Encounter for screening for malignant neoplasm of colon: Secondary | ICD-10-CM

## 2021-05-17 MED ORDER — NA SULFATE-K SULFATE-MG SULF 17.5-3.13-1.6 GM/177ML PO SOLN: 1.0000 | Freq: Once | ORAL | 0 refills | Status: AC

## 2021-05-17 NOTE — Progress Notes (Signed)
Gastroenterology Pre-Procedure Review ? ?Request Date: 06/30/2021 ?Requesting Physician: Dr. Allen Norris ? ?PATIENT REVIEW QUESTIONS: The patient responded to the following health history questions as indicated:   ? ?1. Are you having any GI issues? no ?2. Do you have a personal history of Polyps? no ?3. Do you have a family history of Colon Cancer or Polyps? yes (Maternal uncle- colon cancer) ?4. Diabetes Mellitus? no ?5. Joint replacements in the past 12 months?no ?6. Major health problems in the past 3 months?no ?7. Any artificial heart valves, MVP, or defibrillator?no ?   ?MEDICATIONS & ALLERGIES:    ?Patient reports the following regarding taking any anticoagulation/antiplatelet therapy:   ?Plavix, Coumadin, Eliquis, Xarelto, Lovenox, Pradaxa, Brilinta, or Effient? no ?Aspirin? no ? ?Patient confirms/reports the following medications:  ?Current Outpatient Medications  ?Medication Sig Dispense Refill  ? ergocalciferol (VITAMIN D2) 1.25 MG (50000 UT) capsule Take 1 capsule (50,000 Units total) by mouth once a week. 4 capsule 5  ? ?No current facility-administered medications for this visit.  ? ? ?Patient confirms/reports the following allergies:  ?Allergies  ?Allergen Reactions  ? Codeine Nausea And Vomiting  ? Red Dye Nausea Only  ? ? ?No orders of the defined types were placed in this encounter. ? ? ?AUTHORIZATION INFORMATION ?Primary Insurance: ?1D#: ?Group #: ? ?Secondary Insurance: ?1D#: ?Group #: ? ?SCHEDULE INFORMATION: ?Date: 06/30/2021  ?Time: ?Location: [facility] ? ?

## 2021-05-18 LAB — UA/M W/RFLX CULTURE, ROUTINE
Bilirubin, UA: NEGATIVE
Glucose, UA: NEGATIVE
Ketones, UA: NEGATIVE
Nitrite, UA: NEGATIVE
Protein,UA: NEGATIVE
Specific Gravity, UA: 1.02 (ref 1.005–1.030)
Urobilinogen, Ur: 0.2 mg/dL (ref 0.2–1.0)
pH, UA: 5.5 (ref 5.0–7.5)

## 2021-05-18 LAB — MICROSCOPIC EXAMINATION
Casts: NONE SEEN /lpf
Epithelial Cells (non renal): >10 /hpf — AB (ref 0–10)

## 2021-05-18 LAB — URINE CULTURE, REFLEX: Organism ID, Bacteria: NO GROWTH

## 2021-05-24 ENCOUNTER — Telehealth: Payer: Self-pay

## 2021-05-24 NOTE — Telephone Encounter (Signed)
Patient had requested to reschedule but realized her procedure is scheduled for April. Advised patient to go to pharmacy to pick up prep ahead of time. Pt verbalized understanding. ?

## 2021-06-07 ENCOUNTER — Telehealth: Payer: Self-pay

## 2021-06-07 ENCOUNTER — Other Ambulatory Visit: Payer: Self-pay | Admitting: Physician Assistant

## 2021-06-07 DIAGNOSIS — F411 Generalized anxiety disorder: Secondary | ICD-10-CM

## 2021-06-07 MED ORDER — CLONAZEPAM 0.5 MG PO TABS: ORAL_TABLET | ORAL | 0 refills | Status: DC

## 2021-06-07 NOTE — Telephone Encounter (Signed)
LMOM that lauren sent prescription to pharmacy ?

## 2021-06-07 NOTE — Telephone Encounter (Signed)
Called LMOM that Tracy Baker sent the rx for clonazepam to her pharmacy ?

## 2021-06-22 ENCOUNTER — Other Ambulatory Visit: Payer: Medicare Other

## 2021-06-27 ENCOUNTER — Ambulatory Visit: Payer: Medicare Other | Admitting: Physician Assistant

## 2021-06-27 ENCOUNTER — Ambulatory Visit: Payer: Medicare Other

## 2021-06-27 DIAGNOSIS — R0989 Other specified symptoms and signs involving the circulatory and respiratory systems: Secondary | ICD-10-CM

## 2021-06-30 ENCOUNTER — Encounter: Payer: Self-pay | Admitting: Gastroenterology

## 2021-06-30 ENCOUNTER — Ambulatory Visit: Payer: Medicare Other | Admitting: Anesthesiology

## 2021-06-30 ENCOUNTER — Ambulatory Visit
Admission: RE | Admit: 2021-06-30 | Discharge: 2021-06-30 | Disposition: A | Discharge status: Home / Self Care | Payer: Medicare Other | Source: Ambulatory Visit | Attending: Gastroenterology | Admitting: Gastroenterology

## 2021-06-30 ENCOUNTER — Encounter
Admission: RE | Disposition: A | Discharge status: Home / Self Care | Payer: Self-pay | Source: Ambulatory Visit | Attending: Gastroenterology

## 2021-06-30 DIAGNOSIS — K635 Polyp of colon: Secondary | ICD-10-CM

## 2021-06-30 DIAGNOSIS — F17210 Nicotine dependence, cigarettes, uncomplicated: Secondary | ICD-10-CM | POA: Insufficient documentation

## 2021-06-30 DIAGNOSIS — Z1211 Encounter for screening for malignant neoplasm of colon: Secondary | ICD-10-CM | POA: Diagnosis present

## 2021-06-30 DIAGNOSIS — E079 Disorder of thyroid, unspecified: Secondary | ICD-10-CM | POA: Diagnosis not present

## 2021-06-30 DIAGNOSIS — J449 Chronic obstructive pulmonary disease, unspecified: Secondary | ICD-10-CM | POA: Diagnosis not present

## 2021-06-30 HISTORY — PX: COLONOSCOPY WITH PROPOFOL: SHX5780

## 2021-06-30 SURGERY — COLONOSCOPY WITH PROPOFOL
Anesthesia: General

## 2021-06-30 MED ORDER — PROPOFOL 500 MG/50ML IV EMUL
INTRAVENOUS | Status: DC | PRN
  Administered 2021-06-30: 50 ug/kg/min via INTRAVENOUS

## 2021-06-30 MED ORDER — LIDOCAINE HCL (CARDIAC) PF 100 MG/5ML IV SOSY
PREFILLED_SYRINGE | INTRAVENOUS | Status: DC | PRN
  Administered 2021-06-30: 50 mg via INTRAVENOUS

## 2021-06-30 MED ORDER — MIDAZOLAM HCL 2 MG/2ML IJ SOLN
INTRAMUSCULAR | Status: AC
  Filled 2021-06-30: qty 2

## 2021-06-30 MED ORDER — MIDAZOLAM HCL 2 MG/2ML IJ SOLN
INTRAMUSCULAR | Status: DC | PRN
  Administered 2021-06-30: 2 mg via INTRAVENOUS

## 2021-06-30 MED ORDER — PROPOFOL 10 MG/ML IV BOLUS
INTRAVENOUS | Status: DC | PRN
  Administered 2021-06-30: 50 mg via INTRAVENOUS

## 2021-06-30 MED ORDER — SODIUM CHLORIDE 0.9 % IV SOLN
INTRAVENOUS | Status: DC
  Administered 2021-06-30: 1000 mL via INTRAVENOUS

## 2021-06-30 MED ORDER — FENTANYL CITRATE (PF) 100 MCG/2ML IJ SOLN
INTRAMUSCULAR | Status: DC | PRN
  Administered 2021-06-30 (×2): 50 ug via INTRAVENOUS

## 2021-06-30 MED ORDER — FENTANYL CITRATE (PF) 100 MCG/2ML IJ SOLN
INTRAMUSCULAR | Status: AC
  Filled 2021-06-30: qty 2

## 2021-06-30 NOTE — Anesthesia Preprocedure Evaluation (Addendum)
Anesthesia Evaluation  ?Patient identified by MRN, date of birth, ID band ?Patient awake ? ? ? ?Reviewed: ?Allergy & Precautions, NPO status , Patient's Chart, lab work & pertinent test results ? ?Airway ?Mallampati: III ? ?TM Distance: >3 FB ?Neck ROM: full ? ? ? Dental ? ?(+) Partial Upper, Dental Advisory Given ?  ?Pulmonary ?neg pulmonary ROS, COPD, Current Smoker and Patient abstained from smoking.,  ?  ?Pulmonary exam normal ? ?+ decreased breath sounds ? ? ? ? ? Cardiovascular ?Exercise Tolerance: Good ?negative cardio ROS ?Normal cardiovascular exam ?Rhythm:Regular Rate:Normal ? ? ?  ?Neuro/Psych ?negative neurological ROS ? negative psych ROS  ? GI/Hepatic ?negative GI ROS, Neg liver ROS,   ?Endo/Other  ?negative endocrine ROS ? Renal/GU ?negative Renal ROS  ?negative genitourinary ?  ?Musculoskeletal ?negative musculoskeletal ROS ?(+)  ? Abdominal ?Normal abdominal exam  (+)   ?Peds ?negative pediatric ROS ?(+)  Hematology ?negative hematology ROS ?(+)   ?Anesthesia Other Findings ?Past Medical History: ?No date: Thyroid disease ? ?Past Surgical History: ?1981: AUGMENTATION MAMMAPLASTY; Bilateral ?    Comment:  removed in 2006 ?2007: [facility]unez; Bilateral ?2010: CHOLECYSTECTOMY ?2006: LAPAROSCOPIC TOTAL HYSTERECTOMY ? ?BMI   ? Body Mass Index: 27.12 kg/m?  ?  ? ? Reproductive/Obstetrics ?negative OB ROS ? ?  ? ? ? ? ? ? ? ? ? ? ? ? ? ?  ?  ? ? ? ? ? ? ? ?Anesthesia Physical ?Anesthesia Plan ? ?ASA: 2 ? ?Anesthesia Plan: General  ? ?Post-op Pain Management:   ? ?Induction: Intravenous ? ?PONV Risk Score and Plan: Propofol infusion and TIVA ? ?Airway Management Planned: Natural Airway and Nasal Cannula ? ?Additional Equipment:  ? ?Intra-op Plan:  ? ?Post-operative Plan:  ? ?Informed Consent: I have reviewed the patients History and Physical, chart, labs and discussed the procedure including the risks, benefits and alternatives for the proposed anesthesia with the  patient or authorized representative who has indicated his/her understanding and acceptance.  ? ? ? ?Dental Advisory Given ? ?Plan Discussed with: CRNA and Surgeon ? ?Anesthesia Plan Comments:   ? ? ? ? ? ? ?Anesthesia Quick Evaluation ? ?

## 2021-06-30 NOTE — Transfer of Care (Signed)
Immediate Anesthesia Transfer of Care Note ? ?Patient: Tracy Baker ? ?Procedure(s) Performed: COLONOSCOPY WITH PROPOFOL ? ?Patient Location: PACU ? ?Anesthesia Type:General ? ?Level of Consciousness: sedated ? ?Airway & Oxygen Therapy: Patient Spontanous Breathing and Patient connected to nasal cannula oxygen ? ?Post-op Assessment: Report given to RN and Post -op Vital signs reviewed and stable ? ?Post vital signs: Reviewed and stable ? ?Last Vitals:  ?Vitals Value Taken Time  ?BP 110/74 06/30/21 0906  ?Temp 35.7 ?C 06/30/21 0905  ?Pulse 66 06/30/21 0905  ?Resp 12 06/30/21 0906  ?SpO2 99 % 06/30/21 0905  ?Vitals shown include unvalidated device data. ? ?Last Pain:  ?Vitals:  ? 06/30/21 0905  ?TempSrc: Temporal  ?PainSc: Asleep  ?   ? ?  ? ?Complications: No notable events documented. ?

## 2021-06-30 NOTE — Anesthesia Postprocedure Evaluation (Signed)
Anesthesia Post Note ? ?Patient: Tracy Baker ? ?Procedure(s) Performed: COLONOSCOPY WITH PROPOFOL ? ?Patient location during evaluation: PACU ?Anesthesia Type: General ?Level of consciousness: awake and oriented ?Pain management: pain level controlled ?Vital Signs Assessment: post-procedure vital signs reviewed and stable ?Respiratory status: spontaneous breathing and respiratory function stable ?Cardiovascular status: stable ?Anesthetic complications: no ? ? ?No notable events documented. ? ? ?Last Vitals:  ?Vitals:  ? 06/30/21 0915 06/30/21 0925  ?BP: 134/69 124/90  ?Pulse:    ?Resp:    ?Temp:    ?SpO2:    ?  ?Last Pain:  ?Vitals:  ? 06/30/21 0925  ?TempSrc:   ?PainSc: 0-No pain  ? ? ?  ?  ?  ?  ?  ?  ? ?VAN STAVEREN,Devika Dragovich ? ? ? ? ?

## 2021-06-30 NOTE — H&P (Signed)
? ?  Lucilla Lame, MD [facility] ?[address] ?[place], [place] [address] ?Phone: [phone] ?Fax : [phone] ? ?Primary Care Physician:  Mylinda Latina, PA-C ?Primary Gastroenterologist:  Dr. Allen Norris ? ?Pre-Procedure History & Physical: ?HPI:  Tracy Baker is a 66 y.o. female is here for a screening colonoscopy.  ? ?Past Medical History:  ?Diagnosis Date  ? Thyroid disease   ? ? ?Past Surgical History:  ?Procedure Laterality Date  ? AUGMENTATION MAMMAPLASTY Bilateral 1981  ? removed in 2006  ? BREAST IMPLANT REMOVAL Bilateral 2007  ? CHOLECYSTECTOMY  2010  ? LAPAROSCOPIC TOTAL HYSTERECTOMY  2006  ? ? ?Prior to Admission medications   ?Medication Sig Start Date End Date Taking? Authorizing Provider  ?clonazePAM (KLONOPIN) 0.5 MG tablet Take 1/2 to 1 tablet by mouth daily as needed for anxiety 06/07/21  Yes McDonough, Lauren K, PA-C  ?ergocalciferol (VITAMIN D2) 1.25 MG (50000 UT) capsule Take 1 capsule (50,000 Units total) by mouth once a week. 03/31/21  Yes McDonough, Si Gaul, PA-C  ? ? ?Allergies as of 05/17/2021 - Review Complete 05/16/2021  ?Allergen Reaction Noted  ? Codeine Nausea And Vomiting 03/18/2021  ? Red dye Nausea Only 03/18/2021  ? ? ?Family History  ?Problem Relation Age of Onset  ? Hypertension Mother   ? Diabetes Father   ? Breast cancer Neg Hx   ? ? ?Social History  ? ?Socioeconomic History  ? Marital status: Single  ?  Spouse name: Not on file  ? Number of children: Not on file  ? Years of education: Not on file  ? Highest education level: Not on file  ?Occupational History  ? Not on file  ?Tobacco Use  ? Smoking status: Every Day  ?  Packs/day: 1.00  ?  Types: Cigarettes  ? Smokeless tobacco: Never  ? Tobacco comments:  ?  About 15-20 cigarettes a day  ?Substance and Sexual Activity  ? Alcohol use: Yes  ? Drug use: Never  ? Sexual activity: Not Currently  ?Other Topics Concern  ? Not on file  ?Social History Narrative  ? Not on file  ? ?Social Determinants of Health  ? ?Financial  Resource Strain: Not on file  ?Food Insecurity: Not on file  ?Transportation Needs: Not on file  ?Physical Activity: Not on file  ?Stress: Not on file  ?Social Connections: Not on file  ?Intimate Partner Violence: Not on file  ? ? ?Review of Systems: ?See HPI, otherwise negative ROS ? ?Physical Exam: ?BP 132/78   Pulse 73   Temp (!) 96.7 ?F (35.9 ?C) (Temporal)   Resp 18   Ht '5\' 4"'$  (1.626 m)   Wt 71.7 kg   SpO2 98%   BMI 27.12 kg/m?  ?General:   Alert,  pleasant and cooperative in NAD ?Head:  Normocephalic and atraumatic. ?Neck:  Supple; no masses or thyromegaly. ?Lungs:  Clear throughout to auscultation.    ?Heart:  Regular rate and rhythm. ?Abdomen:  Soft, nontender and nondistended. Normal bowel sounds, without guarding, and without rebound.   ?Neurologic:  Alert and  oriented x4;  grossly normal neurologically. ? ?Impression/Plan: ?Tracy Baker is now here to undergo a screening colonoscopy. ? ?Risks, benefits, and alternatives regarding colonoscopy have been reviewed with the patient.  Questions have been answered.  All parties agreeable. ?

## 2021-06-30 NOTE — Op Note (Signed)
[facility] ?Gastroenterology ?Patient Name: Tracy Baker ?Procedure Date: 06/30/2021 8:33 AM ?MRN: [id number] ?Account #: [id number] ?Date of Birth: [birth year] ?Admit Type: Outpatient ?Age: 66 ?Room: [facility] ENDO ROOM 4 ?Gender: Female ?Note Status: Finalized ?Instrument Name: Colonoscope [id number] ?Procedure:             Colonoscopy ?Indications:           Screening for colorectal malignant neoplasm ?Providers:             Lucilla Lame MD, MD ?Referring MD:          Forest Gleason Md, MD (Referring MD) ?Medicines:             Propofol per Anesthesia ?Complications:         No immediate complications. ?Procedure:             Pre-Anesthesia Assessment: ?                       - Prior to the procedure, a History and Physical was  ?                       performed, and patient medications and allergies were  ?                       reviewed. The patient's tolerance of previous  ?                       anesthesia was also reviewed. The risks and benefits  ?                       of the procedure and the sedation options and risks  ?                       were discussed with the patient. All questions were  ?                       answered, and informed consent was obtained. Prior  ?                       Anticoagulants: The patient has taken no previous  ?                       anticoagulant or antiplatelet agents. ASA Grade  ?                       Assessment: II - A patient with mild systemic disease.  ?                       After reviewing the risks and benefits, the patient  ?                       was deemed in satisfactory condition to undergo the  ?                       procedure. ?                       After obtaining informed consent, the colonoscope was  ?  passed under direct vision. Throughout the procedure,  ?                       the patient's blood pressure, pulse, and oxygen  ?                       saturations were monitored continuously. The  ?                        Colonoscope was introduced through the anus and  ?                       advanced to the the cecum, identified by appendiceal  ?                       orifice and ileocecal valve. The colonoscopy was  ?                       performed without difficulty. The patient tolerated  ?                       the procedure well. The quality of the bowel  ?                       preparation was excellent. ?Findings: ?     The perianal and digital rectal examinations were normal. ?     A 5 mm polyp was found in the transverse colon. The polyp was sessile.  ?     The polyp was removed with a cold snare. Resection and retrieval were  ?     complete. ?Impression:            - One 5 mm polyp in the transverse colon, removed with  ?                       a cold snare. Resected and retrieved. ?Recommendation:        - Discharge patient to home. ?                       - Resume previous diet. ?                       - Continue present medications. ?                       - Await pathology results. ?                       - If the pathology report reveals adenomatous tissue,  ?                       then repeat the colonoscopy for surveillance in 7  ?                       years otherwise 10 years. ?Procedure Code(s):     --- Professional --- ?                       [phone], Colonoscopy, flexible; with removal of  ?  tumor(s), polyp(s), or other lesion(s) by snare  ?                       technique ?Diagnosis Code(s):     --- Professional --- ?                       Z12.11, Encounter for screening for malignant neoplasm  ?                       of colon ?                       K63.5, Polyp of colon ?CPT copyright 2019 American Medical Association. All rights reserved. ?The codes documented in this report are preliminary and upon coder review may  ?be revised to meet current compliance requirements. ?Lucilla Lame MD, MD ?06/30/2021 9:04:09 AM ?This report has been signed electronically. ?Number of Addenda: 0 ?Note  Initiated On: 06/30/2021 8:33 AM ?Scope Withdrawal Time: 0 hours 7 minutes 35 seconds  ?Total Procedure Duration: 0 hours 12 minutes 33 seconds  ?Estimated Blood Loss:  Estimated blood loss: none. ?     [facility] ?

## 2021-07-01 ENCOUNTER — Encounter: Payer: Self-pay | Admitting: Gastroenterology

## 2021-07-01 LAB — SURGICAL PATHOLOGY

## 2021-07-04 ENCOUNTER — Encounter: Payer: Self-pay | Admitting: Physician Assistant

## 2021-07-04 ENCOUNTER — Ambulatory Visit (INDEPENDENT_AMBULATORY_CARE_PROVIDER_SITE_OTHER): Payer: Medicare Other | Admitting: Physician Assistant

## 2021-07-04 ENCOUNTER — Encounter: Payer: Self-pay | Admitting: Gastroenterology

## 2021-07-04 DIAGNOSIS — E782 Mixed hyperlipidemia: Secondary | ICD-10-CM | POA: Diagnosis not present

## 2021-07-04 DIAGNOSIS — G4700 Insomnia, unspecified: Secondary | ICD-10-CM

## 2021-07-04 DIAGNOSIS — F411 Generalized anxiety disorder: Secondary | ICD-10-CM

## 2021-07-04 MED ORDER — LOVASTATIN 20 MG PO TABS: 20.0000 mg | ORAL_TABLET | Freq: Every day | ORAL | 3 refills | Status: DC

## 2021-07-04 MED ORDER — TRAZODONE HCL 50 MG PO TABS: 50.0000 mg | ORAL_TABLET | Freq: Every day | ORAL | 2 refills | Status: DC

## 2021-07-04 NOTE — Progress Notes (Signed)
[facility] ?[address] ?[place], [place] [address] ? ?Internal MEDICINE  ?Office Visit Note ? ?Patient Name: Tracy Baker ? [id number]  ?[id number] ? ?Date of Service: 07/04/2021 ? ?Chief Complaint  ?Patient presents with  ? Follow-up  ? Dizziness  ?  Having some symptoms of onset vertigo - happens a couple times a year  ? ? ?HPI ?Pt is here for routine follow up ?-Sometimes gets some vertigo and has this today after bending over to pick sometimes up the there day. She has previously seen ENT and did positional therapy in the past. She is doing ok currently and just tries not to move head too quickly. ?-Had colonoscopy and was told 7-10 year follow up, will schedule mammogram ?-Had her carotid US, showing minimal plaque, but no significant stenosis ?-increased stress and anxiety. Has only used 2 tabs of klonopin as she only takes this if needed. ?-Does have some difficulty sleeping still and wants to avoid using klonopin nightly, may try trazodone and will start with 1/2 2tab. Says she also used to drink tea that would help and may retry this as well. ?-smoking close to 1ppd most days, but not everyday and that is her next goal to reduce this ?-Will need to recheck A1c next visit and consider repeat labs for LFTs ?-has lost 9lbs since jan ? ?Current Medication: ?Outpatient Encounter Medications as of 07/04/2021  ?Medication Sig  ? clonazePAM (KLONOPIN) 0.5 MG tablet Take 1/2 to 1 tablet by mouth daily as needed for anxiety  ? ergocalciferol (VITAMIN D2) 1.25 MG (50000 UT) capsule Take 1 capsule (50,000 Units total) by mouth once a week.  ? lovastatin (MEVACOR) 20 MG tablet Take 1 tablet (20 mg total) by mouth at bedtime.  ? traZODone (DESYREL) 50 MG tablet Take 1 tablet (50 mg total) by mouth at bedtime.  ? ?No facility-administered encounter medications on file as of 07/04/2021.  ? ? ?Surgical History: ?Past Surgical History:  ?Procedure Laterality Date  ? AUGMENTATION MAMMAPLASTY Bilateral 1981  ? removed  in 2006  ? BREAST IMPLANT REMOVAL Bilateral 2007  ? CHOLECYSTECTOMY  2010  ? COLONOSCOPY WITH PROPOFOL N/A 06/30/2021  ? Procedure: COLONOSCOPY WITH PROPOFOL;  Surgeon: Lucilla Lame, MD;  Location: [facility] ENDOSCOPY;  Service: Endoscopy;  Laterality: N/A;  ? LAPAROSCOPIC TOTAL HYSTERECTOMY  2006  ? ? ?Medical History: ?Past Medical History:  ?Diagnosis Date  ? Thyroid disease   ? ? ?Family History: ?Family History  ?Problem Relation Age of Onset  ? Hypertension Mother   ? Diabetes Father   ? Breast cancer Neg Hx   ? ? ?Social History  ? ?Socioeconomic History  ? Marital status: Single  ?  Spouse name: Not on file  ? Number of children: Not on file  ? Years of education: Not on file  ? Highest education level: Not on file  ?Occupational History  ? Not on file  ?Tobacco Use  ? Smoking status: Every Day  ?  Packs/day: 1.00  ?  Types: Cigarettes  ? Smokeless tobacco: Never  ? Tobacco comments:  ?  About 15-20 cigarettes a day  ?Substance and Sexual Activity  ? Alcohol use: Yes  ? Drug use: Never  ? Sexual activity: Not Currently  ?Other Topics Concern  ? Not on file  ?Social History Narrative  ? Not on file  ? ?Social Determinants of Health  ? ?Financial Resource Strain: Not on file  ?Food Insecurity: Not on file  ?Transportation Needs: Not on file  ?  Physical Activity: Not on file  ?Stress: Not on file  ?Social Connections: Not on file  ?Intimate Partner Violence: Not on file  ? ? ? ? ?Review of Systems  ?Constitutional:  Negative for chills, fatigue and unexpected weight change.  ?HENT:  Negative for congestion, rhinorrhea, sneezing and sore throat.   ?Eyes:  Negative for redness.  ?Respiratory:  Negative for cough, chest tightness and shortness of breath.   ?Cardiovascular:  Negative for chest pain and palpitations.  ?Gastrointestinal:  Negative for abdominal pain, constipation, diarrhea, nausea and vomiting.  ?Genitourinary:  Negative for dysuria and frequency.  ?Musculoskeletal:  Negative for arthralgias, back pain,  joint swelling and neck pain.  ?Skin:  Negative for rash.  ?Neurological: Negative.  Negative for tremors and numbness.  ?Hematological:  Negative for adenopathy. Does not bruise/bleed easily.  ?Psychiatric/Behavioral:  Positive for sleep disturbance. Negative for behavioral problems (Depression) and suicidal ideas. The patient is nervous/anxious.   ? ?Vital Signs: ?BP 133/71   Pulse 80   Temp 98.4 ?F (36.9 ?C)   Resp 16   Ht '5\' 4"'$  (1.626 m)   Wt 157 lb 3.2 oz (71.3 kg)   SpO2 97%   BMI 26.98 kg/m?  ? ? ?Physical Exam ?Vitals and nursing note reviewed.  ?Constitutional:   ?   General: She is not in acute distress. ?   Appearance: She is well-developed. She is not diaphoretic.  ?HENT:  ?   Head: Normocephalic and atraumatic.  ?   Mouth/Throat:  ?   Pharynx: No oropharyngeal exudate.  ?Eyes:  ?   Pupils: Pupils are equal, round, and reactive to light.  ?Neck:  ?   Thyroid: No thyromegaly.  ?   Vascular: No JVD.  ?   Trachea: No tracheal deviation.  ?Cardiovascular:  ?   Rate and Rhythm: Normal rate and regular rhythm.  ?   Heart sounds: Normal heart sounds. No murmur heard. ?  No friction rub. No gallop.  ?Pulmonary:  ?   Effort: Pulmonary effort is normal. No respiratory distress.  ?   Breath sounds: No wheezing or rales.  ?Chest:  ?   Chest wall: No tenderness.  ?Abdominal:  ?   General: Bowel sounds are normal.  ?   Palpations: Abdomen is soft.  ?Musculoskeletal:     ?   General: Normal range of motion.  ?   Cervical back: Normal range of motion and neck supple.  ?Lymphadenopathy:  ?   Cervical: No cervical adenopathy.  ?Skin: ?   General: Skin is warm and dry.  ?Neurological:  ?   Mental Status: She is alert and oriented to person, place, and time.  ?   Cranial Nerves: No cranial nerve deficit.  ?Psychiatric:     ?   Behavior: Behavior normal.     ?   Thought Content: Thought content normal.     ?   Judgment: Judgment normal.  ? ? ? ? ? ?Assessment/Plan: ?1. GAD (generalized anxiety disorder) ?May continue  klonopin only as needed ? ?2. Insomnia, unspecified type ?May start with 1/2 tab trazodone before bed ?- traZODone (DESYREL) 50 MG tablet; Take 1 tablet (50 mg total) by mouth at bedtime.  Dispense: 30 tablet; Refill: 2 ? ?3. Mixed hyperlipidemia ?Will start lovastatin and continue to monitor. Also working on [employer] and may start fish oil supplement ?- lovastatin (MEVACOR) 20 MG tablet; Take 1 tablet (20 mg total) by mouth at bedtime.  Dispense: 90 tablet; Refill: 3 ? ? ?  General Counseling: gwenith tschida understanding of the findings of todays visit and agrees with plan of treatment. I have discussed any further diagnostic evaluation that may be needed or ordered today. We also reviewed her medications today. she has been encouraged to call the office with any questions or concerns that should arise related to todays visit. ? ? ? ?No orders of the defined types were placed in this encounter. ? ? ?Meds ordered this encounter  ?Medications  ? lovastatin (MEVACOR) 20 MG tablet  ?  Sig: Take 1 tablet (20 mg total) by mouth at bedtime.  ?  Dispense:  90 tablet  ?  Refill:  3  ? traZODone (DESYREL) 50 MG tablet  ?  Sig: Take 1 tablet (50 mg total) by mouth at bedtime.  ?  Dispense:  30 tablet  ?  Refill:  2  ? ? ?This patient was seen by Drema Dallas, PA-C in collaboration with Dr. Clayborn Bigness as a part of collaborative care agreement. ? ? ?Total time spent:30 Minutes ?Time spent includes review of chart, medications, test results, and follow up plan with the patient.  ? ? ? ? ?Dr Lavera Guise ?Internal medicine  ?

## 2021-07-06 NOTE — Procedures (Signed)
[facility] ?[id number] ?[place], [place] [address] ? ? ? ?CAROTID DOPPLER INTERPRETATION: ? ?Bilateral Carotid Ultrsasound and Color Doppler Examination was performed. The RIGHT CCA shows minimal plaque in the vessel. The LEFT CCA shows minimal plaque in the vessel. There was no intimal thickening noted in the RIGHT carotid artery. There was no intimal thickening in the LEFT carotid artery. ? ?The RIGHT CCA shows peak systolic velocity of 52 cm per second. The end diastolic velocity is 13 cm per second on the RIGHT side. The RIGHT ICA shows peak systolic velocity of 78 per second. RIGHT sided ICA end diastolic velocity is 29 cm per second. The RIGHT ECA shows a peak systolic velocity of 73 cm per second. The ICA/CCA ratio is calculated to be 1.5. This suggests less than 50% stenosis. The Vertebral Artery shows antegrade flow. ? ?The LEFT CCA shows peak systolic velocity of 80 cm per second. The end diastolic velocity is 23 cm per second on the LEFT side. The LEFT ICA shows peak systolic velocity of 84 per second. LEFT sided ICA end diastolic velocity is 26 cm per second. The LEFT ECA shows a peak systolic velocity of 75 cm per second. The ICA/CCA ratio is calculated to be 1.1. This suggests less than 50% stenosis. The Vertebral Artery shows antegrade flow. ? ? ?Impression:   ? ?The RIGHT CAROTID shows less than 50% stenosis. The LEFT CAROTID shows less than 50% stenosis.  There is minimal plaque formation noted on the LEFT and minimal plaque on the RIGHT  side. Consider a repeat Carotid doppler if clinical situation and symptoms warrant in 6-12 months. Patient should be encouraged to change lifestyles such as smoking cessation, regular exercise and dietary modification. Use of statins in the right clinical setting and ASA is encouraged. ? ?Allyne Gee, MD FCCP ?Pulmonary Critical Care Medicine ? ?

## 2021-09-05 ENCOUNTER — Ambulatory Visit (INDEPENDENT_AMBULATORY_CARE_PROVIDER_SITE_OTHER): Payer: Medicare Other | Admitting: Physician Assistant

## 2021-09-05 ENCOUNTER — Encounter: Payer: Self-pay | Admitting: Physician Assistant

## 2021-09-05 DIAGNOSIS — E782 Mixed hyperlipidemia: Secondary | ICD-10-CM | POA: Diagnosis not present

## 2021-09-05 DIAGNOSIS — D229 Melanocytic nevi, unspecified: Secondary | ICD-10-CM

## 2021-09-05 DIAGNOSIS — R7303 Prediabetes: Secondary | ICD-10-CM | POA: Diagnosis not present

## 2021-09-05 DIAGNOSIS — R7989 Other specified abnormal findings of blood chemistry: Secondary | ICD-10-CM | POA: Diagnosis not present

## 2021-09-05 DIAGNOSIS — F411 Generalized anxiety disorder: Secondary | ICD-10-CM | POA: Diagnosis not present

## 2021-09-05 LAB — POCT GLYCOSYLATED HEMOGLOBIN (HGB A1C): Hemoglobin A1C: 5.6 % (ref 4.0–5.6)

## 2021-09-05 NOTE — Progress Notes (Signed)
[facility] [place], [place] [address]  Internal MEDICINE  Office Visit Note  Patient Name: Tracy Baker  [id number]  [id number]  Date of Service: 09/05/2021  Chief Complaint  Patient presents with   Follow-up    Wants skin checked, pt wants left ear cleaned out   Hypothyroidism    HPI Pt is here for routine follow up -In the process of moving on the 14th and has been helping her daughter after hip surgery -Still needs to do mammogram and bone density -Taking cholesterol med 1-2 x per week and will work on remembering this as she forgets it -Takes klonopin only as needed -Sleep still poor but hasnt tried trazodone still -smoking less than 1ppd and is continuing to work on this -Will recheck LFTs and lipids as well as thyroid since it has been abnormal in the past -Has derm appt in Sept, she does have several places she would like to have checked. Several areas consistent with SK, but does have a darker mole as well on leg that she says has been looked at in the past and told was ok but thinks it is more noticeable now and would like it rechecked.  -Would like ears checked to see if they need to be cleaned out however upon check look pretty clear  Current Medication: Outpatient Encounter Medications as of 09/05/2021  Medication Sig   clonazePAM (KLONOPIN) 0.5 MG tablet Take 1/2 to 1 tablet by mouth daily as needed for anxiety   ergocalciferol (VITAMIN D2) 1.25 MG (50000 UT) capsule Take 1 capsule (50,000 Units total) by mouth once a week.   lovastatin (MEVACOR) 20 MG tablet Take 1 tablet (20 mg total) by mouth at bedtime.   traZODone (DESYREL) 50 MG tablet Take 1 tablet (50 mg total) by mouth at bedtime.   No facility-administered encounter medications on file as of 09/05/2021.    Surgical History: Past Surgical History:  Procedure Laterality Date   AUGMENTATION MAMMAPLASTY Bilateral 1981   removed in 2006   BREAST IMPLANT REMOVAL Bilateral 2007    CHOLECYSTECTOMY  2010   COLONOSCOPY WITH PROPOFOL N/A 06/30/2021   Procedure: COLONOSCOPY WITH PROPOFOL;  Surgeon: Lucilla Lame, MD;  Location: [facility] ENDOSCOPY;  Service: Endoscopy;  Laterality: N/A;   LAPAROSCOPIC TOTAL HYSTERECTOMY  2006    Medical History: Past Medical History:  Diagnosis Date   Thyroid disease     Family History: Family History  Problem Relation Age of Onset   Hypertension Mother    Diabetes Father    Breast cancer Neg Hx     Social History   Socioeconomic History   Marital status: Single    Spouse name: Not on file   Number of children: Not on file   Years of education: Not on file   Highest education level: Not on file  Occupational History   Not on file  Tobacco Use   Smoking status: Every Day    Packs/day: 1.00    Types: Cigarettes   Smokeless tobacco: Never   Tobacco comments:    About 15-20 cigarettes a day  Substance and Sexual Activity   Alcohol use: Yes   Drug use: Never   Sexual activity: Not Currently  Other Topics Concern   Not on file  Social History Narrative   Not on file   Social Determinants of Health   Financial Resource Strain: Not on file  Food Insecurity: Not on file  Transportation Needs: Not on file  Physical Activity: Not  on file  Stress: Not on file  Social Connections: Not on file  Intimate Partner Violence: Not on file      Review of Systems  Constitutional:  Negative for chills, fatigue and unexpected weight change.  HENT:  Negative for congestion, rhinorrhea, sneezing and sore throat.   Eyes:  Negative for redness.  Respiratory:  Negative for cough, chest tightness and shortness of breath.   Cardiovascular:  Negative for chest pain and palpitations.  Gastrointestinal:  Negative for abdominal pain, constipation, diarrhea, nausea and vomiting.  Genitourinary:  Negative for dysuria and frequency.  Musculoskeletal:  Negative for arthralgias, back pain, joint swelling and neck pain.  Skin:  Negative for  rash.       Skin lesions  Neurological: Negative.  Negative for tremors and numbness.  Hematological:  Negative for adenopathy. Does not bruise/bleed easily.  Psychiatric/Behavioral:  Positive for sleep disturbance. Negative for behavioral problems (Depression) and suicidal ideas. The patient is nervous/anxious.     Vital Signs: BP 120/78   Pulse 68   Temp 98.4 F (36.9 C)   Resp 16   Ht '5\' 4"'$  (1.626 m)   Wt 161 lb (73 kg)   SpO2 98%   BMI 27.64 kg/m    Physical Exam Vitals and nursing note reviewed.  Constitutional:      General: She is not in acute distress.    Appearance: She is well-developed. She is not diaphoretic.  HENT:     Head: Normocephalic and atraumatic.     Mouth/Throat:     Pharynx: No oropharyngeal exudate.  Eyes:     Pupils: Pupils are equal, round, and reactive to light.  Neck:     Thyroid: No thyromegaly.     Vascular: No JVD.     Trachea: No tracheal deviation.  Cardiovascular:     Rate and Rhythm: Normal rate and regular rhythm.     Heart sounds: Normal heart sounds. No murmur heard.    No friction rub. No gallop.  Pulmonary:     Effort: Pulmonary effort is normal. No respiratory distress.     Breath sounds: No wheezing or rales.  Chest:     Chest wall: No tenderness.  Abdominal:     General: Bowel sounds are normal.     Palpations: Abdomen is soft.  Musculoskeletal:        General: Normal range of motion.     Cervical back: Normal range of motion and neck supple.  Lymphadenopathy:     Cervical: No cervical adenopathy.  Skin:    General: Skin is warm and dry.     Findings: Lesion present.     Comments: Light brown lesion with SK appearance on left side of clavicle, also on left side of forehead. Darker well circumscribed lesion on left lower leg  Neurological:     Mental Status: She is alert and oriented to person, place, and time.     Cranial Nerves: No cranial nerve deficit.  Psychiatric:        Behavior: Behavior normal.         Thought Content: Thought content normal.        Judgment: Judgment normal.        Assessment/Plan: 1. Prediabetes - POCT HgB A1C is 5.6 which is back in normal range. Will continue to monitor  2. Mixed hyperlipidemia Will do better about remembering lovastatin and will recheck labs - Lipid Panel With LDL/HDL Ratio  3. GAD (generalized anxiety disorder) May continue klonopin as needed  4. Elevated LFTs Will recheck labs - Comprehensive metabolic panel  5. Abnormal thyroid blood test - TSH + free T4  6. Atypical mole Has appt with dermatology in Sept to evaluate multiple skin lesions that she finds bothersome   General Counseling: delonda coley understanding of the findings of todays visit and agrees with plan of treatment. I have discussed any further diagnostic evaluation that may be needed or ordered today. We also reviewed her medications today. she has been encouraged to call the office with any questions or concerns that should arise related to todays visit.    Orders Placed This Encounter  Procedures   Comprehensive metabolic panel   Lipid Panel With LDL/HDL Ratio   TSH + free T4   POCT HgB A1C    No orders of the defined types were placed in this encounter.   This patient was seen by Drema Dallas, PA-C in collaboration with Dr. Clayborn Bigness as a part of collaborative care agreement.   Total time spent:30 Minutes Time spent includes review of chart, medications, test results, and follow up plan with the patient.      Dr Lavera Guise Internal medicine

## 2021-09-08 ENCOUNTER — Telehealth: Payer: Self-pay

## 2021-09-08 NOTE — Telephone Encounter (Signed)
Patient called to cancel ear wash for 09/09/2021 due to being busy moving. Pt will call to reschedule when she is ready.

## 2021-09-09 ENCOUNTER — Ambulatory Visit: Payer: Medicare Other

## 2021-11-09 ENCOUNTER — Ambulatory Visit: Payer: Medicare Other | Admitting: Dermatology

## 2021-11-09 DIAGNOSIS — L820 Inflamed seborrheic keratosis: Secondary | ICD-10-CM | POA: Diagnosis not present

## 2021-11-09 DIAGNOSIS — L720 Epidermal cyst: Secondary | ICD-10-CM

## 2021-11-09 DIAGNOSIS — D1800 Hemangioma unspecified site: Secondary | ICD-10-CM

## 2021-11-09 DIAGNOSIS — L918 Other hypertrophic disorders of the skin: Secondary | ICD-10-CM

## 2021-11-09 DIAGNOSIS — D2372 Other benign neoplasm of skin of left lower limb, including hip: Secondary | ICD-10-CM

## 2021-11-09 DIAGNOSIS — D1722 Benign lipomatous neoplasm of skin and subcutaneous tissue of left arm: Secondary | ICD-10-CM

## 2021-11-09 DIAGNOSIS — L821 Other seborrheic keratosis: Secondary | ICD-10-CM

## 2021-11-09 DIAGNOSIS — B070 Plantar wart: Secondary | ICD-10-CM | POA: Diagnosis not present

## 2021-11-09 DIAGNOSIS — D229 Melanocytic nevi, unspecified: Secondary | ICD-10-CM

## 2021-11-09 DIAGNOSIS — Z1283 Encounter for screening for malignant neoplasm of skin: Secondary | ICD-10-CM

## 2021-11-09 DIAGNOSIS — L578 Other skin changes due to chronic exposure to nonionizing radiation: Secondary | ICD-10-CM

## 2021-11-09 DIAGNOSIS — L814 Other melanin hyperpigmentation: Secondary | ICD-10-CM | POA: Diagnosis not present

## 2021-11-09 DIAGNOSIS — D485 Neoplasm of uncertain behavior of skin: Secondary | ICD-10-CM

## 2021-11-09 MED ORDER — TRETINOIN 0.05 % EX CREA: TOPICAL_CREAM | Freq: Every day | CUTANEOUS | 6 refills | Status: AC

## 2021-11-09 NOTE — Progress Notes (Signed)
New Patient Visit  Subjective  Tracy Baker is a 66 y.o. female who presents for the following: Other (New patient - No history of skin cancer or abnormal moles - The patient presents for Total-Body Skin Exam (TBSE) for skin cancer screening and mole check.  The patient has spots, moles and lesions to be evaluated, some may be new or changing and the patient has concerns that these could be cancer./).  The following portions of the chart were reviewed this encounter and updated as appropriate:   Tobacco  Allergies  Meds  Problems  Med Hx  Surg Hx  Fam Hx     Review of Systems:  No other skin or systemic complaints except as noted in HPI or Assessment and Plan.  Objective  Well appearing patient in no apparent distress; mood and affect are within normal limits.  A full examination was performed including scalp, head, eyes, ears, nose, lips, neck, chest, axillae, abdomen, back, buttocks, bilateral upper extremities, bilateral lower extremities, hands, feet, fingers, toes, fingernails, and toenails. All findings within normal limits unless otherwise noted below.  Face Smooth white papule(s).   Left thigh, right arm Rubbery nodules  Left pretibial Dark papule 0.8 cm     Right Abdomen x 1, left scalp x 1, left neck x 1 (3) Erythematous stuck-on, waxy papule or plaque  Right Foot - Posterior Verrucous papules -- Discussed viral etiology and contagion.    Assessment & Plan   Acrochordons (Skin Tags) - Fleshy, skin-colored pedunculated papules - Benign appearing.  - Observe. - If desired, they can be removed with an in office procedure that is not covered by insurance. - Please call the clinic if you notice any new or changing lesions.  Lentigines - Scattered tan macules - Due to sun exposure - Benign-appearing, observe - Recommend daily broad spectrum sunscreen SPF 30+ to sun-exposed areas, reapply every 2 hours as needed. - Call for any changes  Seborrheic  Keratoses - Stuck-on, waxy, tan-brown papules and/or plaques  - Benign-appearing - Discussed benign etiology and prognosis. - Observe - Call for any changes  Melanocytic Nevi - Tan-brown and/or pink-flesh-colored symmetric macules and papules - Benign appearing on exam today - Observation - Call clinic for new or changing moles - Recommend daily use of broad spectrum spf 30+ sunscreen to sun-exposed areas.   Hemangiomas - Red papules - Discussed benign nature - Observe - Call for any changes  Actinic Damage - Chronic condition, secondary to cumulative UV/sun exposure - diffuse scaly erythematous macules with underlying dyspigmentation - Recommend daily broad spectrum sunscreen SPF 30+ to sun-exposed areas, reapply every 2 hours as needed.  - Staying in the shade or wearing long sleeves, sun glasses (UVA+UVB protection) and wide brim hats (4-inch brim around the entire circumference of the hat) are also recommended for sun protection.  - Call for new or changing lesions.  Skin cancer screening performed today.  Milia Face Chronic and persistent condition with duration or expected duration over one year. Condition is symptomatic / bothersome to patient. Not to goal. tretinoin (RETIN-A) 0.05 % cream - Face Apply topically at bedtime.  Lipoma of left upper extremity Left thigh, right arm Benign-appearing.  Observation.  Call clinic for new or changing lesions.  Recommend daily use of broad spectrum spf 30+ sunscreen to sun-exposed areas.    Neoplasm of uncertain behavior of skin Left pretibial Epidermal / dermal shaving  Informed consent: discussed and consent obtained   Timeout: patient name, date of birth,  surgical site, and procedure verified   Procedure prep:  Patient was prepped and draped in usual sterile fashion Prep type:  Isopropyl alcohol Anesthesia: the lesion was anesthetized in a standard fashion   Anesthetic:  1% lidocaine w/ epinephrine 1-100,000 buffered w/  8.4% NaHCO3 Instrument used: flexible razor blade   Hemostasis achieved with: pressure, aluminum chloride and electrodesiccation   Outcome: patient tolerated procedure well   Post-procedure details: sterile dressing applied and wound care instructions given   Dressing type: bandage and petrolatum    Specimen 1 - Surgical pathology Differential Diagnosis: Dermatofibroma R/O Ca Check Margins: No  Inflamed seborrheic keratosis (3) Right Abdomen x 1, left scalp x 1, left neck x 1  Destruction of lesion - Right Abdomen x 1, left scalp x 1, left neck x 1 Complexity: simple   Destruction method: cryotherapy   Informed consent: discussed and consent obtained   Timeout:  patient name, date of birth, surgical site, and procedure verified Lesion destroyed using liquid nitrogen: Yes   Region frozen until ice ball extended beyond lesion: Yes   Outcome: patient tolerated procedure well with no complications   Post-procedure details: wound care instructions given    Plantar wart Right Foot - Posterior Destruction of lesion - Right Foot - Posterior Complexity: simple   Destruction method: cryotherapy   Informed consent: discussed and consent obtained   Timeout:  patient name, date of birth, surgical site, and procedure verified Lesion destroyed using liquid nitrogen: Yes   Region frozen until ice ball extended beyond lesion: Yes   Outcome: patient tolerated procedure well with no complications   Post-procedure details: wound care instructions given    Return in about 4 months (around 03/11/2022) for Follow up.  I, Ashok Cordia, CMA, am acting as scribe for Sarina Ser, MD . Documentation: I have reviewed the above documentation for accuracy and completeness, and I agree with the above.  Sarina Ser, MD

## 2021-11-09 NOTE — Patient Instructions (Addendum)
Cryotherapy Aftercare  Wash gently with soap and water everyday.   Apply Vaseline and Band-Aid daily until healed.     Wound Care Instructions  Cleanse wound gently with soap and water once a day then pat dry with clean gauze. Apply a thin coat of Petrolatum (petroleum jelly, "Vaseline") over the wound (unless you have an allergy to this). We recommend that you use a new, sterile tube of Vaseline. Do not pick or remove scabs. Do not remove the yellow or white "healing tissue" from the base of the wound.  Cover the wound with fresh, clean, nonstick gauze and secure with paper tape. You may use Band-Aids in place of gauze and tape if the wound is small enough, but would recommend trimming much of the tape off as there is often too much. Sometimes Band-Aids can irritate the skin.  You should call the office for your biopsy report after 1 week if you have not already been contacted.  If you experience any problems, such as abnormal amounts of bleeding, swelling, significant bruising, significant pain, or evidence of infection, please call the office immediately.  FOR ADULT SURGERY PATIENTS: If you need something for pain relief you may take 1 extra strength Tylenol (acetaminophen) AND 2 Ibuprofen ('200mg'$  each) together every 4 hours as needed for pain. (do not take these if you are allergic to them or if you have a reason you should not take them.) Typically, you may only need pain medication for 1 to 3 days.   Viral Warts & Molluscum Contagiosum  Viral warts and molluscum contagiosum are growths of the skin caused by viral infection of the skin. If you have been given the diagnosis of viral warts or molluscum contagiosum there are a few things that you must understand about your condition:  There is no guaranteed treatment method available for this condition. Multiple treatments may be required, The treatments may be time consuming and require multiple visits to the dermatology office. The  treatment may be expensive. You will be charged each time you come into the office to have the spots treated. The treated areas may develop new lesions further complicating treatment. The treated areas may leave a scar. There is no guarantee that even after multiple treatments that the spots will be successfully treated. These are caused by a viral infection and can be spread to other areas of the skin and to other people by direct contact. Therefore, new spots may occur.   Due to recent changes in healthcare laws, you may see results of your pathology and/or laboratory studies on MyChart before the doctors have had a chance to review them. We understand that in some cases there may be results that are confusing or concerning to you. Please understand that not all results are received at the same time and often the doctors may need to interpret multiple results in order to provide you with the best plan of care or course of treatment. Therefore, we ask that you please give Tracy Baker 2 business days to thoroughly review all your results before contacting the office for clarification. Should we see a critical lab result, you will be contacted sooner.   If You Need Anything After Your Visit  If you have any questions or concerns for your doctor, please call our main line at [phone] and press option 4 to reach your doctor's medical assistant. If no one answers, please leave a voicemail as directed and we will return your call as soon as possible. Messages left  after 4 pm will be answered the following business day.   You may also send Tracy Baker a message via [place]. We typically respond to MyChart messages within 1-2 business days.  For prescription refills, please ask your pharmacy to contact our office. Our fax number is [phone].  If you have an urgent issue when the clinic is closed that cannot wait until the next business day, you can page your doctor at the number below.    Please note that while  we do our best to be available for urgent issues outside of office hours, we are not available 24/7.   If you have an urgent issue and are unable to reach Tracy Baker, you may choose to seek medical care at your doctor's office, retail clinic, urgent care center, or emergency room.  If you have a medical emergency, please immediately call 911 or go to the emergency department.  Pager Numbers  - Dr. Nehemiah Massed: [phone]  - Dr. Laurence Ferrari: [phone]  - Dr. Nicole Kindred: [phone]  In the event of inclement weather, please call our main line at [phone] for an update on the status of any delays or closures.  Dermatology Medication Tips: Please keep the boxes that topical medications come in in order to help keep track of the instructions about where and how to use these. Pharmacies typically print the medication instructions only on the boxes and not directly on the medication tubes.   If your medication is too expensive, please contact our office at [phone] option 4 or send Tracy Baker a message through [place].   We are unable to tell what your co-pay for medications will be in advance as this is different depending on your insurance coverage. However, we may be able to find a substitute medication at lower cost or fill out paperwork to get insurance to cover a needed medication.   If a prior authorization is required to get your medication covered by your insurance company, please allow Tracy Baker 1-2 business days to complete this process.  Drug prices often vary depending on where the prescription is filled and some pharmacies may offer cheaper prices.  The website www.goodrx.com contains coupons for medications through different pharmacies. The prices here do not account for what the cost may be with help from insurance (it may be cheaper with your insurance), but the website can give you the price if you did not use any insurance.  - You can print the associated coupon and take it with your prescription  to the pharmacy.  - You may also stop by our office during regular business hours and pick up a GoodRx coupon card.  - If you need your prescription sent electronically to a different pharmacy, notify our office through [facility] or by phone at [phone] option 4.     Si Usted Necesita Algo Despus de Su Visita  Tambin puede enviarnos un mensaje a travs de Pharmacist, community. Por lo general respondemos a los mensajes de MyChart en el transcurso de 1 a 2 das hbiles.  Para renovar recetas, por favor pida a su farmacia que se ponga en contacto con nuestra oficina. Harland Dingwall de fax es Nellie [phone].  Si tiene un asunto urgente cuando la clnica est cerrada y que no puede esperar hasta el siguiente da hbil, puede llamar/localizar a su doctor(a) al nmero que aparece a continuacin.   Por favor, tenga en cuenta que aunque hacemos todo lo posible para estar disponibles para asuntos urgentes fuera del horario de oficina, no estamos disponibles  las 24 horas del da, los 7 das de la [place].   Si tiene un problema urgente y no puede comunicarse con nosotros, puede optar por buscar atencin mdica  en el consultorio de su doctor(a), en una clnica privada, en un centro de atencin urgente o en una sala de emergencias.  Si tiene Engineering geologist, por favor llame inmediatamente al 911 o vaya a la sala de emergencias.  Nmeros de bper  - Dr. Nehemiah Massed: [phone]  - Dra. Moye: [phone]  - Dra. Nicole Kindred: [phone]  En caso de inclemencias del [place], por favor llame a Johnsie Kindred principal al [phone] para una actualizacin sobre el [place] de cualquier retraso o cierre.  Consejos para la medicacin en dermatologa: Por favor, guarde las cajas en las que vienen los medicamentos de uso tpico para ayudarle a seguir las instrucciones sobre dnde y cmo usarlos. Las farmacias generalmente imprimen las instrucciones del medicamento slo en las cajas y no directamente en  los tubos del [place].   Si su medicamento es muy caro, por favor, pngase en contacto con Zigmund Daniel llamando al [phone] y presione la opcin 4 o envenos un mensaje a travs de Pharmacist, community.   No podemos decirle cul ser su copago por los medicamentos por adelantado ya que esto es diferente dependiendo de la cobertura de su seguro. Sin embargo, es posible que podamos encontrar un medicamento sustituto a Electrical engineer un formulario para que el seguro cubra el medicamento que se considera necesario.   Si se requiere una autorizacin previa para que su compaa de seguros [place] su medicamento, por favor permtanos de 1 a 2 das hbiles para completar este proceso.  Los precios de los medicamentos varan con frecuencia dependiendo del Environmental consultant de dnde se surte la receta y alguna farmacias pueden ofrecer precios ms baratos.  El sitio web www.goodrx.com tiene cupones para medicamentos de Airline pilot. Los precios aqu no tienen en cuenta lo que podra costar con la ayuda del seguro (puede ser ms barato con su seguro), pero el sitio web puede darle el precio si no utiliz Research scientist (physical sciences).  - Puede imprimir el cupn correspondiente y llevarlo con su receta a la farmacia.  - Tambin puede pasar por nuestra oficina durante el horario de atencin regular y Charity fundraiser una tarjeta de cupones de GoodRx.  - Si necesita que su receta se enve electrnicamente a una farmacia diferente, informe a nuestra oficina a travs de MyChart de [place] o por telfono llamando al [phone] y presione la opcin 4.

## 2021-11-14 ENCOUNTER — Other Ambulatory Visit: Payer: Self-pay

## 2021-11-14 ENCOUNTER — Encounter: Payer: Self-pay | Admitting: Dermatology

## 2021-11-14 ENCOUNTER — Telehealth: Payer: Self-pay

## 2021-11-14 MED ORDER — AMOXICILLIN 500 MG PO TABS: 500.0000 mg | ORAL_TABLET | Freq: Two times a day (BID) | ORAL | 0 refills | Status: DC

## 2021-11-14 NOTE — Telephone Encounter (Signed)
Pt called that had cold symptoms ,and start with sore throat unable to come in advised her to do covid test  and call us back

## 2021-11-14 NOTE — Telephone Encounter (Signed)
As per Tracy Baker  pt advised that we send amoxicillin bid for 7 days  and also advised that take with  food and she is not feeling better need appt

## 2021-11-15 ENCOUNTER — Telehealth: Payer: Self-pay

## 2021-11-15 NOTE — Telephone Encounter (Signed)
-----   Message from Ralene Bathe, MD sent at 11/10/2021  5:54 PM EDT ----- Diagnosis Skin , left pretibial ANGIOKERATOMA  Benign blood vessel growth No further treatment needed Recheck next visit

## 2021-11-15 NOTE — Telephone Encounter (Signed)
Left message on voicemail to return my call.  

## 2021-11-23 ENCOUNTER — Telehealth: Payer: Self-pay

## 2021-11-23 NOTE — Telephone Encounter (Signed)
-----   Message from Ralene Bathe, MD sent at 11/10/2021  5:54 PM EDT ----- Diagnosis Skin , left pretibial ANGIOKERATOMA  Benign blood vessel growth No further treatment needed Recheck next visit

## 2021-11-23 NOTE — Telephone Encounter (Signed)
Advised pt of bx result/sh ?

## 2021-12-05 ENCOUNTER — Ambulatory Visit: Payer: Medicare Other | Admitting: Physician Assistant

## 2022-03-13 ENCOUNTER — Ambulatory Visit: Payer: Medicare Other | Admitting: Dermatology

## 2022-05-18 ENCOUNTER — Ambulatory Visit: Payer: Medicare Other | Admitting: Physician Assistant

## 2022-06-08 ENCOUNTER — Ambulatory Visit (INDEPENDENT_AMBULATORY_CARE_PROVIDER_SITE_OTHER): Payer: Medicare Other | Admitting: Physician Assistant

## 2022-06-08 ENCOUNTER — Encounter: Payer: Self-pay | Admitting: Physician Assistant

## 2022-06-08 VITALS — BP 128/70 | HR 72 | Temp 98.5°F | Resp 16 | Ht 64.0 in | Wt 158.6 lb

## 2022-06-08 DIAGNOSIS — Z0001 Encounter for general adult medical examination with abnormal findings: Secondary | ICD-10-CM

## 2022-06-08 DIAGNOSIS — R300 Dysuria: Secondary | ICD-10-CM

## 2022-06-08 DIAGNOSIS — R5383 Other fatigue: Secondary | ICD-10-CM | POA: Diagnosis not present

## 2022-06-08 DIAGNOSIS — R7303 Prediabetes: Secondary | ICD-10-CM | POA: Diagnosis not present

## 2022-06-08 DIAGNOSIS — E2839 Other primary ovarian failure: Secondary | ICD-10-CM

## 2022-06-08 DIAGNOSIS — F411 Generalized anxiety disorder: Secondary | ICD-10-CM | POA: Diagnosis not present

## 2022-06-08 DIAGNOSIS — E559 Vitamin D deficiency, unspecified: Secondary | ICD-10-CM | POA: Diagnosis not present

## 2022-06-08 DIAGNOSIS — E782 Mixed hyperlipidemia: Secondary | ICD-10-CM | POA: Diagnosis not present

## 2022-06-08 DIAGNOSIS — R7989 Other specified abnormal findings of blood chemistry: Secondary | ICD-10-CM

## 2022-06-08 DIAGNOSIS — Z1231 Encounter for screening mammogram for malignant neoplasm of breast: Secondary | ICD-10-CM | POA: Diagnosis not present

## 2022-06-08 NOTE — Progress Notes (Signed)
[facility] [place], [place] [address]  Internal MEDICINE  Office Visit Note  Patient Name: Tracy Baker  [id number]  [id number]  Date of Service: 06/08/2022  Chief Complaint  Patient presents with   Medicare Wellness   Quality Metric Gaps    Mammogram and Bone Density     HPI Pt is here for routine health maintenance examination -Did have colonoscopy done, told everything went well and has 10 year follow up -Does have toenail fungus and has tried OTC. She is going to follow up with dermatology about this along with skin. Had several places removed last visit and is due for a follow up. -taking klonopin as needed, not really taking trazodone. May call for klonopin refill when needed as she uses it infrequently -Does get nausea sometimes when she has vertigo spells, she has used a nausea medication from a friend that has worked well and helped the vertigo as well. Discussed possible meclizine or zofran, but she is unsure. She may call with name of medication for possible script if needed -Due for mammogram and bone density and is will to schedule these now -Also due for labs  Current Medication: Outpatient Encounter Medications as of 06/08/2022  Medication Sig   amoxicillin (AMOXIL) 500 MG tablet Take 1 tablet (500 mg total) by mouth 2 (two) times daily.   clonazePAM (KLONOPIN) 0.5 MG tablet Take 1/2 to 1 tablet by mouth daily as needed for anxiety   ergocalciferol (VITAMIN D2) 1.25 MG (50000 UT) capsule Take 1 capsule (50,000 Units total) by mouth once a week.   lovastatin (MEVACOR) 20 MG tablet Take 1 tablet (20 mg total) by mouth at bedtime.   traZODone (DESYREL) 50 MG tablet Take 1 tablet (50 mg total) by mouth at bedtime.   tretinoin (RETIN-A) 0.05 % cream Apply topically at bedtime.   No facility-administered encounter medications on file as of 06/08/2022.    Surgical History: Past Surgical History:  Procedure Laterality Date   AUGMENTATION  MAMMAPLASTY Bilateral 1981   removed in 2006   BREAST IMPLANT REMOVAL Bilateral 2007   CHOLECYSTECTOMY  2010   COLONOSCOPY WITH PROPOFOL N/A 06/30/2021   Procedure: COLONOSCOPY WITH PROPOFOL;  Surgeon: Lucilla Lame, MD;  Location[facility] ENDOSCOPY;  Service: Endoscopy;  Laterality: N/A;   LAPAROSCOPIC TOTAL HYSTERECTOMY  2006    Medical History: Past Medical History:  Diagnosis Date   Thyroid disease     Family History: Family History  Problem Relation Age of Onset   Hypertension Mother    Diabetes Father    Breast cancer Neg Hx       Review of Systems  Constitutional:  Negative for chills, fatigue and unexpected weight change.  HENT:  Negative for congestion, rhinorrhea, sneezing and sore throat.   Eyes:  Negative for redness.  Respiratory:  Negative for cough, chest tightness and shortness of breath.   Cardiovascular:  Negative for chest pain and palpitations.  Gastrointestinal:  Negative for abdominal pain, constipation, diarrhea, nausea and vomiting.  Genitourinary:  Negative for dysuria and frequency.  Musculoskeletal:  Negative for arthralgias, back pain, joint swelling and neck pain.  Skin:  Negative for rash.       Skin lesions, toenail fungus  Neurological: Negative.  Negative for tremors and numbness.  Hematological:  Negative for adenopathy. Does not bruise/bleed easily.  Psychiatric/Behavioral:  Positive for sleep disturbance. Negative for behavioral problems (Depression) and suicidal ideas. The patient is nervous/anxious.      Vital Signs: BP 128/70  Comment: 138/103  Pulse 72   Temp 98.5 F (36.9 C)   Resp 16   Ht 5\' 4"  (1.626 m)   Wt 158 lb 9.6 oz (71.9 kg)   SpO2 97%   BMI 27.22 kg/m    Physical Exam Vitals and nursing note reviewed.  Constitutional:      General: She is not in acute distress.    Appearance: Normal appearance. She is well-developed. She is not diaphoretic.  HENT:     Head: Normocephalic and atraumatic.     Mouth/Throat:      Pharynx: No oropharyngeal exudate.  Eyes:     Pupils: Pupils are equal, round, and reactive to light.  Neck:     Thyroid: No thyromegaly.     Vascular: No JVD.     Trachea: No tracheal deviation.  Cardiovascular:     Rate and Rhythm: Normal rate and regular rhythm.     Heart sounds: Normal heart sounds. No murmur heard.    No friction rub. No gallop.  Pulmonary:     Effort: Pulmonary effort is normal. No respiratory distress.     Breath sounds: No wheezing or rales.  Chest:     Chest wall: No tenderness.  Breasts:    Right: Normal. No mass.     Left: Normal. No mass.  Abdominal:     General: Bowel sounds are normal.     Palpations: Abdomen is soft.     Tenderness: There is no abdominal tenderness.  Musculoskeletal:        General: Normal range of motion.     Cervical back: Normal range of motion and neck supple.  Lymphadenopathy:     Cervical: No cervical adenopathy.  Skin:    General: Skin is warm and dry.     Findings: Lesion present.     Comments: Several well healed areas from previous lesion removal including lower leg and side of forehead. Does still have a few lesions including one on stomach.  Neurological:     Mental Status: She is alert and oriented to person, place, and time.     Cranial Nerves: No cranial nerve deficit.  Psychiatric:        Behavior: Behavior normal.        Thought Content: Thought content normal.        Judgment: Judgment normal.      LABS: No results found for this or any previous visit (from the past 2160 hour(s)).      Assessment/Plan: 1. Encounter for general adult medical examination with abnormal findings CPE performed, routine labs ordered, mammogram and BMD due  2. GAD (generalized anxiety disorder) May continue klonopin as needed and may call when script needed  3. Mixed hyperlipidemia - Lipid Panel With LDL/HDL Ratio  4. Elevated LFTs - Comprehensive metabolic panel  5. Abnormal thyroid blood test - TSH + free  T4  6. Prediabetes - Hgb A1C w/o eAG  7. Vitamin D deficiency - VITAMIN D 25 Hydroxy (Vit-D Deficiency, Fractures)  8. Other fatigue - TSH + free T4 - Lipid Panel With LDL/HDL Ratio - CBC w/Diff/Platelet - Comprehensive metabolic panel - VITAMIN D 25 Hydroxy (Vit-D Deficiency, Fractures)  9. Primary ovarian failure - DG Bone Density; Future  10. Visit for screening mammogram - MM 3D SCREENING MAMMOGRAM BILATERAL BREAST; Future  11. Dysuria - UA/M w/rflx Culture, Routine   General Counseling: Tracy Baker understanding of the findings of todays visit and agrees with plan of treatment. I have discussed any  further diagnostic evaluation that may be needed or ordered today. We also reviewed her medications today. she has been encouraged to call the office with any questions or concerns that should arise related to todays visit.    Counseling:    Orders Placed This Encounter  Procedures   MM 3D SCREENING MAMMOGRAM BILATERAL BREAST   DG Bone Density   UA/M w/rflx Culture, Routine   TSH + free T4   Lipid Panel With LDL/HDL Ratio   CBC w/Diff/Platelet   Comprehensive metabolic panel   VITAMIN D 25 Hydroxy (Vit-D Deficiency, Fractures)   Hgb A1C w/o eAG    No orders of the defined types were placed in this encounter.   This patient was seen by Drema Dallas, PA-C in collaboration with Dr. Clayborn Bigness as a part of collaborative care agreement.  Total time spent:35 Minutes  Time spent includes review of chart, medications, test results, and follow up plan with the patient.     Lavera Guise, MD  Internal Medicine

## 2022-06-09 LAB — MICROSCOPIC EXAMINATION
Bacteria, UA: NONE SEEN
Casts: NONE SEEN /lpf

## 2022-06-11 LAB — URINE CULTURE, REFLEX: Organism ID, Bacteria: NO GROWTH

## 2022-06-11 LAB — UA/M W/RFLX CULTURE, ROUTINE
Bilirubin, UA: NEGATIVE
Glucose, UA: NEGATIVE
Ketones, UA: NEGATIVE
Nitrite, UA: NEGATIVE
Protein,UA: NEGATIVE
RBC, UA: NEGATIVE
Specific Gravity, UA: 1.017 (ref 1.005–1.030)
Urobilinogen, Ur: 0.2 mg/dL (ref 0.2–1.0)
pH, UA: 5.5 (ref 5.0–7.5)

## 2022-06-11 LAB — MICROSCOPIC EXAMINATION

## 2022-06-12 DIAGNOSIS — R7303 Prediabetes: Secondary | ICD-10-CM | POA: Diagnosis not present

## 2022-06-12 DIAGNOSIS — E782 Mixed hyperlipidemia: Secondary | ICD-10-CM | POA: Diagnosis not present

## 2022-06-12 DIAGNOSIS — E559 Vitamin D deficiency, unspecified: Secondary | ICD-10-CM | POA: Diagnosis not present

## 2022-06-12 DIAGNOSIS — R5383 Other fatigue: Secondary | ICD-10-CM | POA: Diagnosis not present

## 2022-06-12 DIAGNOSIS — R7989 Other specified abnormal findings of blood chemistry: Secondary | ICD-10-CM | POA: Diagnosis not present

## 2022-06-13 ENCOUNTER — Telehealth: Payer: Self-pay

## 2022-06-13 LAB — CBC WITH DIFFERENTIAL/PLATELET
Basophils Absolute: 0 10*3/uL (ref 0.0–0.2)
Basos: 1 %
EOS (ABSOLUTE): 0.1 10*3/uL (ref 0.0–0.4)
Eos: 2 %
Hematocrit: 46.9 % — ABNORMAL HIGH (ref 34.0–46.6)
Hemoglobin: 15.9 g/dL (ref 11.1–15.9)
Immature Grans (Abs): 0 10*3/uL (ref 0.0–0.1)
Immature Granulocytes: 0 %
Lymphocytes Absolute: 2.2 10*3/uL (ref 0.7–3.1)
Lymphs: 41 %
MCH: 32.4 pg (ref 26.6–33.0)
MCHC: 33.9 g/dL (ref 31.5–35.7)
MCV: 96 fL (ref 79–97)
Monocytes Absolute: 0.4 10*3/uL (ref 0.1–0.9)
Monocytes: 7 %
Neutrophils Absolute: 2.7 10*3/uL (ref 1.4–7.0)
Neutrophils: 49 %
Platelets: 211 10*3/uL (ref 150–450)
RBC: 4.91 x10E6/uL (ref 3.77–5.28)
RDW: 12.4 % (ref 11.7–15.4)
WBC: 5.4 10*3/uL (ref 3.4–10.8)

## 2022-06-13 LAB — COMPREHENSIVE METABOLIC PANEL
ALT: 29 IU/L (ref 0–32)
AST: 20 IU/L (ref 0–40)
Albumin/Globulin Ratio: 1.9 (ref 1.2–2.2)
Albumin: 4.2 g/dL (ref 3.9–4.9)
Alkaline Phosphatase: 150 IU/L — ABNORMAL HIGH (ref 44–121)
BUN/Creatinine Ratio: 33 — ABNORMAL HIGH (ref 12–28)
BUN: 22 mg/dL (ref 8–27)
Bilirubin Total: 0.4 mg/dL (ref 0.0–1.2)
CO2: 21 mmol/L (ref 20–29)
Calcium: 9.2 mg/dL (ref 8.7–10.3)
Chloride: 104 mmol/L (ref 96–106)
Creatinine, Ser: 0.67 mg/dL (ref 0.57–1.00)
Globulin, Total: 2.2 g/dL (ref 1.5–4.5)
Glucose: 111 mg/dL — ABNORMAL HIGH (ref 70–99)
Potassium: 4.4 mmol/L (ref 3.5–5.2)
Sodium: 140 mmol/L (ref 134–144)
Total Protein: 6.4 g/dL (ref 6.0–8.5)
eGFR: 96 mL/min/{1.73_m2} (ref >59)

## 2022-06-13 LAB — LIPID PANEL WITH LDL/HDL RATIO
Cholesterol, Total: 230 mg/dL — ABNORMAL HIGH (ref 100–199)
HDL: 52 mg/dL (ref >39)
LDL Chol Calc (NIH): 158 mg/dL — ABNORMAL HIGH (ref 0–99)
LDL/HDL Ratio: 3 ratio (ref 0.0–3.2)
Triglycerides: 110 mg/dL (ref 0–149)
VLDL Cholesterol Cal: 20 mg/dL (ref 5–40)

## 2022-06-13 LAB — TSH+FREE T4
Free T4: 1.44 ng/dL (ref 0.82–1.77)
TSH: 2.45 u[IU]/mL (ref 0.450–4.500)

## 2022-06-13 LAB — HGB A1C W/O EAG: Hgb A1c MFr Bld: 6 % — ABNORMAL HIGH (ref 4.8–5.6)

## 2022-06-13 LAB — VITAMIN D 25 HYDROXY (VIT D DEFICIENCY, FRACTURES): Vit D, 25-Hydroxy: 22.9 ng/mL — ABNORMAL LOW (ref 30.0–100.0)

## 2022-06-13 NOTE — Telephone Encounter (Signed)
-----   Message from Lauren K McDonough, PA-C sent at 06/13/2022 12:23 PM EDT ----- Please let her know that her cholesterol is still elevated but improving, vit d low but improved--continue vit D supplement, A1c did go up to 6 (still prediabetic) and should continue to improve diet ad exercise. 

## 2022-06-13 NOTE — Telephone Encounter (Signed)
Left message for patient regarding lab results. 

## 2022-06-14 ENCOUNTER — Other Ambulatory Visit: Payer: Self-pay

## 2022-06-14 ENCOUNTER — Telehealth: Payer: Self-pay

## 2022-06-14 MED ORDER — ONDANSETRON HCL 4 MG PO TABS: 4.0000 mg | ORAL_TABLET | Freq: Three times a day (TID) | ORAL | 0 refills | Status: DC | PRN

## 2022-06-14 NOTE — Telephone Encounter (Signed)
Pt advised as per lauren send ondansetron 4 mg to her phar

## 2022-06-14 NOTE — Telephone Encounter (Signed)
-----   Message from Carlean Jews, PA-C sent at 06/13/2022 12:23 PM EDT ----- Please let her know that her cholesterol is still elevated but improving, vit d low but improved--continue vit D supplement, A1c did go up to 6 (still prediabetic) and should continue to improve diet ad exercise.

## 2022-06-14 NOTE — Telephone Encounter (Signed)
Pt advised for labs and also she was not taking lovastatin but she started and advised her to follow diet and exercise or glucose and take OTC vitamin D

## 2022-06-30 ENCOUNTER — Telehealth: Payer: Self-pay

## 2022-06-30 DIAGNOSIS — B029 Zoster without complications: Secondary | ICD-10-CM | POA: Diagnosis not present

## 2022-06-30 NOTE — Telephone Encounter (Signed)
Patient called back letting us know she went to urgent care and she has shingles.

## 2022-06-30 NOTE — Telephone Encounter (Signed)
Pt called that she is having since Monday  headache back of her head and she is stress due to she  hydrated and take ibuprofen moving and not hydrated as per alyssa advised that she need get hydrated first and take ibuprofen also if its not feeling better need go to urgent care

## 2022-07-03 ENCOUNTER — Telehealth: Payer: Self-pay

## 2022-07-03 ENCOUNTER — Other Ambulatory Visit: Payer: Self-pay | Admitting: Internal Medicine

## 2022-07-03 ENCOUNTER — Telehealth: Payer: Self-pay | Admitting: Internal Medicine

## 2022-07-03 ENCOUNTER — Encounter: Payer: Self-pay | Admitting: Physician Assistant

## 2022-07-03 MED ORDER — PREGABALIN 25 MG PO CAPS: ORAL_CAPSULE | ORAL | 1 refills | Status: DC

## 2022-07-03 NOTE — Telephone Encounter (Signed)
Patient lvm @ 4:49 on after-hour line regarding lyrica rx. I returned call to patient. Left her vm stating the rx was sent in to pharmacy at [facility] [address]

## 2022-07-03 NOTE — Telephone Encounter (Signed)
Patient called to report that Shingles have not improved since taking Prednisone/Valacyclovir and has spread to forehead and is causing pain. Per DFK, these medications are correct for Shingles and take some time to kick in. DFK will send Lyrica to patient's pharmacy for pain. Offered patient appointment with [facility], but patient says she will try Lyrica first and see if there is any improvement.

## 2022-07-04 ENCOUNTER — Other Ambulatory Visit: Payer: Self-pay | Admitting: Nurse Practitioner

## 2022-07-04 MED ORDER — PREGABALIN 25 MG PO CAPS: 25.0000 mg | ORAL_CAPSULE | Freq: Three times a day (TID) | ORAL | 1 refills | Status: DC | PRN

## 2022-07-04 NOTE — Telephone Encounter (Signed)
Spoke with pt and [facility]  have it

## 2022-07-26 ENCOUNTER — Other Ambulatory Visit: Payer: Self-pay | Admitting: Physician Assistant

## 2022-07-26 DIAGNOSIS — N6489 Other specified disorders of breast: Secondary | ICD-10-CM

## 2022-07-27 ENCOUNTER — Encounter: Payer: Self-pay | Admitting: Physician Assistant

## 2022-08-23 ENCOUNTER — Other Ambulatory Visit: Payer: Medicare Other

## 2022-09-15 ENCOUNTER — Other Ambulatory Visit: Payer: Medicare Other

## 2022-11-27 ENCOUNTER — Other Ambulatory Visit: Payer: Self-pay

## 2022-11-27 ENCOUNTER — Telehealth: Payer: Self-pay

## 2022-11-27 MED ORDER — ONDANSETRON HCL 4 MG PO TABS: 4.0000 mg | ORAL_TABLET | Freq: Three times a day (TID) | ORAL | 0 refills | Status: DC | PRN

## 2022-11-27 NOTE — Telephone Encounter (Signed)
Pt called that she need refills for Zofran lmom for pt that as per alyssa med sent

## 2023-02-26 ENCOUNTER — Ambulatory Visit: Payer: Medicare Other | Admitting: Nurse Practitioner

## 2023-03-28 ENCOUNTER — Other Ambulatory Visit: Payer: Self-pay

## 2023-03-28 MED ORDER — ONDANSETRON HCL 4 MG PO TABS: 4.0000 mg | ORAL_TABLET | Freq: Three times a day (TID) | ORAL | 0 refills | Status: DC | PRN

## 2023-06-11 ENCOUNTER — Telehealth: Payer: Self-pay | Admitting: Physician Assistant

## 2023-06-11 ENCOUNTER — Encounter: Payer: Self-pay | Admitting: Physician Assistant

## 2023-06-11 ENCOUNTER — Ambulatory Visit (INDEPENDENT_AMBULATORY_CARE_PROVIDER_SITE_OTHER): Payer: Medicare Other | Admitting: Physician Assistant

## 2023-06-11 VITALS — BP 138/80 | HR 77 | Temp 97.8°F | Resp 16 | Ht 64.0 in | Wt 158.0 lb

## 2023-06-11 DIAGNOSIS — D229 Melanocytic nevi, unspecified: Secondary | ICD-10-CM

## 2023-06-11 DIAGNOSIS — E2839 Other primary ovarian failure: Secondary | ICD-10-CM

## 2023-06-11 DIAGNOSIS — Z1283 Encounter for screening for malignant neoplasm of skin: Secondary | ICD-10-CM | POA: Diagnosis not present

## 2023-06-11 DIAGNOSIS — Z0000 Encounter for general adult medical examination without abnormal findings: Secondary | ICD-10-CM

## 2023-06-11 DIAGNOSIS — N6323 Unspecified lump in the left breast, lower outer quadrant: Secondary | ICD-10-CM | POA: Diagnosis not present

## 2023-06-11 DIAGNOSIS — N6489 Other specified disorders of breast: Secondary | ICD-10-CM

## 2023-06-11 MED ORDER — ONDANSETRON HCL 4 MG PO TABS: 4.0000 mg | ORAL_TABLET | Freq: Three times a day (TID) | ORAL | 0 refills | Status: DC | PRN

## 2023-06-11 NOTE — Progress Notes (Signed)
 [facility] [address] [place], [place] [address]  Internal MEDICINE  Office Visit Note  Patient Name: Tracy Baker  [id number]  [id number]  Date of Service: 06/11/2023  Chief Complaint  Patient presents with   Medicare Wellness   Hypothyroidism   Quality Metric Gaps    Mammogram and bone density and zoster     HPI Janny presents for an annual well visit Well-appearing 68 y.o. female Routine CRC screening: UTD, done in 2023 Routine mammogram: still needs mammogram--diagnostic and US  prev ordered, does have possible cystic lesions of left breast at 5 o'clock and will still need diagnostic/US  done DEXA scan: Will reorder bone density Labs: will be ordered -would like referral to dermatology to evaluate skin lesions/full skin check     06/11/2023    9:21 AM 06/08/2022   10:54 AM 05/16/2021    9:56 AM  MMSE - Mini Mental State Exam  Orientation to time 5 5 5   Orientation to Place 5 5 5   Registration 3 3 3   Attention/ Calculation 5 5 5   Recall 3 3 3   Language- name 2 objects 2 2 2   Language- repeat 1 1 1   Language- follow 3 step command 3 3 3   Language- read & follow direction 1 1 1   Write a sentence 1 1 1   Copy design 1 1 1   Total score 30 30 30     Functional Status Survey: Is the patient deaf or have difficulty hearing?: No Does the patient have difficulty seeing, even when wearing glasses/contacts?: No Does the patient have difficulty concentrating, remembering, or making decisions?: No Does the patient have difficulty walking or climbing stairs?: No Does the patient have difficulty dressing or bathing?: No Does the patient have difficulty doing errands alone such as visiting a doctor's office or shopping?: No     06/30/2021    7:44 AM 07/04/2021    9:42 AM 09/05/2021    9:25 AM 06/08/2022   10:53 AM 06/11/2023    9:20 AM  Fall Risk  Falls in the past year?  0 0 0 0  (RETIRED) Patient Fall Risk Level Low fall risk  Low fall risk    Patient at Risk for  Falls Due to   No Fall Risks  No Fall Risks  Fall risk Follow up   Falls evaluation completed  Falls evaluation completed       06/11/2023    9:21 AM  Depression screen PHQ 2/9  Decreased Interest 0  Down, Depressed, Hopeless 0  PHQ - 2 Score 0        No data to display            Current Medication: Outpatient Encounter Medications as of 06/11/2023  Medication Sig   clonazePAM  (KLONOPIN ) 0.5 MG tablet Take 1/2 to 1 tablet by mouth daily as needed for anxiety   [DISCONTINUED] amoxicillin  (AMOXIL ) 500 MG tablet Take 1 tablet (500 mg total) by mouth 2 (two) times daily.   ondansetron  (ZOFRAN ) 4 MG tablet Take 1 tablet (4 mg total) by mouth every 8 (eight) hours as needed for nausea or vomiting.   [DISCONTINUED] ergocalciferol  (VITAMIN D2) 1.25 MG (50000 UT) capsule Take 1 capsule (50,000 Units total) by mouth once a week. (Patient not taking: Reported on 06/11/2023)   [DISCONTINUED] lovastatin  (MEVACOR ) 20 MG tablet Take 1 tablet (20 mg total) by mouth at bedtime. (Patient not taking: Reported on 06/11/2023)   [DISCONTINUED] ondansetron  (ZOFRAN ) 4 MG tablet Take 1 tablet (4 mg total)  by mouth every 8 (eight) hours as needed for nausea or vomiting. (Patient not taking: Reported on 06/11/2023)   [DISCONTINUED] pregabalin  (LYRICA ) 25 MG capsule Take 1 capsule (25 mg total) by mouth 3 (three) times daily as needed (nerve pain). (Patient not taking: Reported on 06/11/2023)   [DISCONTINUED] traZODone  (DESYREL ) 50 MG tablet Take 1 tablet (50 mg total) by mouth at bedtime. (Patient not taking: Reported on 06/11/2023)   No facility-administered encounter medications on file as of 06/11/2023.    Surgical History: Past Surgical History:  Procedure Laterality Date   AUGMENTATION MAMMAPLASTY Bilateral 1981   removed in 2006   BREAST IMPLANT REMOVAL Bilateral 2007   CHOLECYSTECTOMY  2010   COLONOSCOPY WITH PROPOFOL  N/A 06/30/2021   Procedure: COLONOSCOPY WITH PROPOFOL ;  Surgeon: Marnee Sink, MD;   Location: [facility] ENDOSCOPY;  Service: Endoscopy;  Laterality: N/A;   LAPAROSCOPIC TOTAL HYSTERECTOMY  2006    Medical History: Past Medical History:  Diagnosis Date   Thyroid  disease     Family History: Family History  Problem Relation Age of Onset   Hypertension Mother    Diabetes Father    Breast cancer Neg Hx     Social History   Socioeconomic History   Marital status: Single    Spouse name: Not on file   Number of children: Not on file   Years of education: Not on file   Highest education level: Not on file  Occupational History   Not on file  Tobacco Use   Smoking status: Every Day    Current packs/day: 1.00    Types: Cigarettes   Smokeless tobacco: Never   Tobacco comments:    About 15-20 cigarettes a day  Substance and Sexual Activity   Alcohol use: Yes   Drug use: Never   Sexual activity: Not Currently  Other Topics Concern   Not on file  Social History Narrative   Not on file   Social Drivers of Health   Financial Resource Strain: Not on file  Food Insecurity: Not on file  Transportation Needs: Not on file  Physical Activity: Not on file  Stress: Not on file  Social Connections: Not on file  Intimate Partner Violence: Not on file      Review of Systems  Constitutional:  Negative for chills, fatigue and unexpected weight change.  HENT:  Negative for congestion, rhinorrhea, sneezing and sore throat.   Eyes:  Negative for redness.  Respiratory:  Negative for cough, chest tightness and shortness of breath.   Cardiovascular:  Negative for chest pain and palpitations.  Gastrointestinal:  Negative for abdominal pain, constipation, diarrhea, nausea and vomiting.  Genitourinary:  Negative for dysuria and frequency.  Musculoskeletal:  Negative for arthralgias, back pain, joint swelling and neck pain.  Skin:  Negative for rash.       Skin lesions  Neurological: Negative.  Negative for tremors and numbness.  Hematological:  Negative for adenopathy. Does  not bruise/bleed easily.  Psychiatric/Behavioral:  Positive for sleep disturbance. Negative for behavioral problems (Depression) and suicidal ideas. The patient is nervous/anxious.     Vital Signs: BP 138/80   Pulse 77   Temp 97.8 F (36.6 C)   Resp 16   Ht 5\' 4"  (1.626 m)   Wt 158 lb (71.7 kg)   SpO2 99%   BMI 27.12 kg/m    Physical Exam Vitals and nursing note reviewed.  Constitutional:      Appearance: Normal appearance.  HENT:     Head: Normocephalic and  atraumatic.  Eyes:     Extraocular Movements: Extraocular movements intact.  Cardiovascular:     Rate and Rhythm: Normal rate and regular rhythm.  Pulmonary:     Effort: Pulmonary effort is normal.     Breath sounds: Normal breath sounds.  Chest:     Chest wall: No tenderness.  Breasts:    Right: No mass or tenderness.     Left: Mass present. No tenderness.    Skin:    Findings: Lesion present.  Neurological:     Mental Status: She is alert.  Psychiatric:        Mood and Affect: Mood normal.        Behavior: Behavior normal.        Assessment/Plan: 1. Encounter for annual wellness exam in Medicare patient (Primary) AWV performed, lab slip given, due for mammogram and bone density  2. Primary ovarian failure - DG Bone Density; Future  3. Atypical mole Will refer to further evaluation - Ambulatory referral to Dermatology  4. Skin cancer screening - Ambulatory referral to Dermatology  5. Mass of lower outer quadrant of left breast - MM 3D DIAGNOSTIC MAMMOGRAM BILATERAL BREAST; Future - US  LIMITED ULTRASOUND INCLUDING AXILLA RIGHT BREAST; Future - US  LIMITED ULTRASOUND INCLUDING AXILLA LEFT BREAST ; Future  6. Distortion of contour of breast - MM 3D DIAGNOSTIC MAMMOGRAM BILATERAL BREAST; Future - US  LIMITED ULTRASOUND INCLUDING AXILLA RIGHT BREAST; Future - US  LIMITED ULTRASOUND INCLUDING AXILLA LEFT BREAST ; Future     General Counseling: kidada ging understanding of the findings of  todays visit and agrees with plan of treatment. I have discussed any further diagnostic evaluation that may be needed or ordered today. We also reviewed her medications today. she has been encouraged to call the office with any questions or concerns that should arise related to todays visit.    Orders Placed This Encounter  Procedures   DG Bone Density   MM 3D DIAGNOSTIC MAMMOGRAM BILATERAL BREAST   US  LIMITED ULTRASOUND INCLUDING AXILLA RIGHT BREAST   US  LIMITED ULTRASOUND INCLUDING AXILLA LEFT BREAST    Ambulatory referral to Dermatology    Meds ordered this encounter  Medications   ondansetron  (ZOFRAN ) 4 MG tablet    Sig: Take 1 tablet (4 mg total) by mouth every 8 (eight) hours as needed for nausea or vomiting.    Dispense:  20 tablet    Refill:  0    Return in about 4 weeks (around 07/09/2023) for lab review, mammogram and bone density scheduled.   Total time spent:35 Minutes Time spent includes review of chart, medications, test results, and follow up plan with the patient.   [place] Controlled Substance Database was reviewed by me.  This patient was seen by Taylor Favia, PA-C in collaboration with Dr. Verneta Gone as a part of collaborative care agreement.  Taylor Favia, PA-C Internal medicine

## 2023-06-11 NOTE — Telephone Encounter (Signed)
 Dermatology referral sent via Epic to [facility]. Highlighted for patient on [facility]

## 2023-06-13 ENCOUNTER — Other Ambulatory Visit: Payer: Self-pay | Admitting: Physician Assistant

## 2023-06-13 DIAGNOSIS — Z0001 Encounter for general adult medical examination with abnormal findings: Secondary | ICD-10-CM | POA: Diagnosis not present

## 2023-06-13 DIAGNOSIS — E559 Vitamin D deficiency, unspecified: Secondary | ICD-10-CM | POA: Diagnosis not present

## 2023-06-13 DIAGNOSIS — E782 Mixed hyperlipidemia: Secondary | ICD-10-CM | POA: Diagnosis not present

## 2023-06-13 DIAGNOSIS — R7303 Prediabetes: Secondary | ICD-10-CM | POA: Diagnosis not present

## 2023-06-13 DIAGNOSIS — Z1329 Encounter for screening for other suspected endocrine disorder: Secondary | ICD-10-CM | POA: Diagnosis not present

## 2023-06-14 LAB — CBC WITH DIFFERENTIAL/PLATELET
Basophils Absolute: 0 10*3/uL (ref 0.0–0.2)
Basos: 1 %
EOS (ABSOLUTE): 0.2 10*3/uL (ref 0.0–0.4)
Eos: 2 %
Hematocrit: 46.5 % (ref 34.0–46.6)
Hemoglobin: 15.9 g/dL (ref 11.1–15.9)
Immature Grans (Abs): 0 10*3/uL (ref 0.0–0.1)
Immature Granulocytes: 0 %
Lymphocytes Absolute: 2.4 10*3/uL (ref 0.7–3.1)
Lymphs: 34 %
MCH: 32.9 pg (ref 26.6–33.0)
MCHC: 34.2 g/dL (ref 31.5–35.7)
MCV: 96 fL (ref 79–97)
Monocytes Absolute: 0.4 10*3/uL (ref 0.1–0.9)
Monocytes: 6 %
Neutrophils Absolute: 4.1 10*3/uL (ref 1.4–7.0)
Neutrophils: 57 %
Platelets: 274 10*3/uL (ref 150–450)
RBC: 4.83 x10E6/uL (ref 3.77–5.28)
RDW: 12.6 % (ref 11.7–15.4)
WBC: 7.1 10*3/uL (ref 3.4–10.8)

## 2023-06-14 LAB — LIPID PANEL WITH LDL/HDL RATIO
Cholesterol, Total: 240 mg/dL — ABNORMAL HIGH (ref 100–199)
HDL: 51 mg/dL (ref >39)
LDL Chol Calc (NIH): 170 mg/dL — ABNORMAL HIGH (ref 0–99)
LDL/HDL Ratio: 3.3 ratio — ABNORMAL HIGH (ref 0.0–3.2)
Triglycerides: 109 mg/dL (ref 0–149)
VLDL Cholesterol Cal: 19 mg/dL (ref 5–40)

## 2023-06-14 LAB — COMPREHENSIVE METABOLIC PANEL WITH GFR
ALT: 31 IU/L (ref 0–32)
AST: 23 IU/L (ref 0–40)
Albumin: 4.4 g/dL (ref 3.9–4.9)
Alkaline Phosphatase: 152 IU/L — ABNORMAL HIGH (ref 44–121)
BUN/Creatinine Ratio: 35 — ABNORMAL HIGH (ref 12–28)
BUN: 22 mg/dL (ref 8–27)
Bilirubin Total: 0.5 mg/dL (ref 0.0–1.2)
CO2: 23 mmol/L (ref 20–29)
Calcium: 9.6 mg/dL (ref 8.7–10.3)
Chloride: 104 mmol/L (ref 96–106)
Creatinine, Ser: 0.63 mg/dL (ref 0.57–1.00)
Globulin, Total: 2.1 g/dL (ref 1.5–4.5)
Glucose: 115 mg/dL — ABNORMAL HIGH (ref 70–99)
Potassium: 4.5 mmol/L (ref 3.5–5.2)
Sodium: 141 mmol/L (ref 134–144)
Total Protein: 6.5 g/dL (ref 6.0–8.5)
eGFR: 97 mL/min/{1.73_m2} (ref >59)

## 2023-06-14 LAB — TSH: TSH: 3.19 u[IU]/mL (ref 0.450–4.500)

## 2023-06-14 LAB — VITAMIN D 25 HYDROXY (VIT D DEFICIENCY, FRACTURES): Vit D, 25-Hydroxy: 22.8 ng/mL — ABNORMAL LOW (ref 30.0–100.0)

## 2023-06-14 LAB — HGB A1C W/O EAG: Hgb A1c MFr Bld: 5.8 % — ABNORMAL HIGH (ref 4.8–5.6)

## 2023-06-14 LAB — T4, FREE: Free T4: 1.28 ng/dL (ref 0.82–1.77)

## 2023-07-03 ENCOUNTER — Encounter: Payer: Self-pay | Admitting: Dermatology

## 2023-07-03 ENCOUNTER — Ambulatory Visit: Admitting: Dermatology

## 2023-07-03 DIAGNOSIS — D229 Melanocytic nevi, unspecified: Secondary | ICD-10-CM

## 2023-07-03 DIAGNOSIS — B353 Tinea pedis: Secondary | ICD-10-CM | POA: Diagnosis not present

## 2023-07-03 DIAGNOSIS — D1801 Hemangioma of skin and subcutaneous tissue: Secondary | ICD-10-CM

## 2023-07-03 DIAGNOSIS — I781 Nevus, non-neoplastic: Secondary | ICD-10-CM

## 2023-07-03 DIAGNOSIS — D485 Neoplasm of uncertain behavior of skin: Secondary | ICD-10-CM

## 2023-07-03 DIAGNOSIS — L578 Other skin changes due to chronic exposure to nonionizing radiation: Secondary | ICD-10-CM | POA: Diagnosis not present

## 2023-07-03 DIAGNOSIS — L821 Other seborrheic keratosis: Secondary | ICD-10-CM | POA: Diagnosis not present

## 2023-07-03 DIAGNOSIS — D1739 Benign lipomatous neoplasm of skin and subcutaneous tissue of other sites: Secondary | ICD-10-CM | POA: Diagnosis not present

## 2023-07-03 DIAGNOSIS — B070 Plantar wart: Secondary | ICD-10-CM

## 2023-07-03 DIAGNOSIS — W908XXA Exposure to other nonionizing radiation, initial encounter: Secondary | ICD-10-CM

## 2023-07-03 DIAGNOSIS — L820 Inflamed seborrheic keratosis: Secondary | ICD-10-CM

## 2023-07-03 DIAGNOSIS — B351 Tinea unguium: Secondary | ICD-10-CM

## 2023-07-03 DIAGNOSIS — L814 Other melanin hyperpigmentation: Secondary | ICD-10-CM

## 2023-07-03 DIAGNOSIS — D492 Neoplasm of unspecified behavior of bone, soft tissue, and skin: Secondary | ICD-10-CM

## 2023-07-03 DIAGNOSIS — Z1283 Encounter for screening for malignant neoplasm of skin: Secondary | ICD-10-CM | POA: Diagnosis not present

## 2023-07-03 NOTE — Progress Notes (Signed)
 Follow-Up Visit   Subjective  Tracy Baker is a 68 y.o. female who presents for the following: Skin Cancer Screening and Full Body Skin Exam  The patient presents for Total-Body Skin Exam (TBSE) for skin cancer screening and mole check. The patient has spots, moles and lesions to be evaluated, some may be new or changing and the patient may have concern these could be cancer.  Pt concerned about lesion on the R low back, L ant thigh, L temple, and R abdomen that she would like checked today.  The following portions of the chart were reviewed this encounter and updated as appropriate: medications, allergies, medical history  Review of Systems:  No other skin or systemic complaints except as noted in HPI or Assessment and Plan.  Objective  Well appearing patient in no apparent distress; mood and affect are within normal limits.  A full examination was performed including scalp, head, eyes, ears, nose, lips, neck, chest, axillae, abdomen, back, buttocks, bilateral upper extremities, bilateral lower extremities, hands, feet, fingers, toes, fingernails, and toenails. All findings within normal limits unless otherwise noted below.   Relevant physical exam findings are noted in the Assessment and Plan.  R abdomen 0.8 cm hyperkeratotic pink papule  L temple/scalp x 1 Erythematous stuck-on, waxy papule or plaque R plantar foot Verrucous papules -- Discussed viral etiology and contagion.   Assessment & Plan   SKIN CANCER SCREENING PERFORMED TODAY.  ACTINIC DAMAGE - Chronic condition, secondary to cumulative UV/sun exposure - diffuse scaly erythematous macules with underlying dyspigmentation - Recommend daily broad spectrum sunscreen SPF 30+ to sun-exposed areas, reapply every 2 hours as needed.  - Staying in the shade or wearing long sleeves, sun glasses (UVA+UVB protection) and wide brim hats (4-inch brim around the entire circumference of the hat) are also recommended for sun  protection.  - Call for new or changing lesions.  LENTIGINES, SEBORRHEIC KERATOSES, HEMANGIOMAS - Benign normal skin lesions - Benign-appearing - Call for any changes  MELANOCYTIC NEVI - Tan-brown and/or pink-flesh-colored symmetric macules and papules - Benign appearing on exam today - Observation - Call clinic for new or changing moles - Recommend daily use of broad spectrum spf 30+ sunscreen to sun-exposed areas.   Lipoma  Exam: Subcutaneous rubbery nodule(s) Location: L ant thigh, R upper arm, R lower back  Benign-appearing. Exam most consistent with a lipoma. Discussed that a lipoma is a benign fatty growth that can grow over time and sometimes get irritated. Recommend observation if it is not bothersome or changing. Discussed option of ILK injections or surgical excision to remove it if it is growing, symptomatic, or other changes noted. Please call for new or changing lesions so they can be evaluated.  NEOPLASM OF UNCERTAIN BEHAVIOR OF SKIN R abdomen Skin / nail biopsy Type of biopsy: tangential   Informed consent: discussed and consent obtained   Timeout: patient name, date of birth, surgical site, and procedure verified   Procedure prep:  Patient was prepped and draped in usual sterile fashion Prep type:  Isopropyl alcohol Anesthesia: the lesion was anesthetized in a standard fashion   Anesthetic:  1% lidocaine  w/ epinephrine 1-100,000 buffered w/ 8.4% NaHCO3 Instrument used: flexible razor blade   Hemostasis achieved with: pressure, aluminum chloride and electrodesiccation   Outcome: patient tolerated procedure well   Post-procedure details: sterile dressing applied and wound care instructions given   Dressing type: bandage (Mupirocin 2% ointment)   Specimen 1 - Surgical pathology Differential Diagnosis: D48.5 r/o SK vs verruca  vs SCC Check Margins: No INFLAMED SEBORRHEIC KERATOSIS L temple/scalp x 1 Symptomatic, irritating, patient would like treated.  Destruction  of lesion - L temple/scalp x 1 Complexity: simple   Destruction method: cryotherapy   Informed consent: discussed and consent obtained   Timeout:  patient name, date of birth, surgical site, and procedure verified Lesion destroyed using liquid nitrogen: Yes   Region frozen until ice ball extended beyond lesion: Yes   Outcome: patient tolerated procedure well with no complications   Post-procedure details: wound care instructions given   PLANTAR WART R plantar foot Counseling Discussed viral / HPV (Human Papilloma Virus) etiology and risk of spread /infectivity to other areas of body as well as to other people.  Multiple treatments and methods may be required to clear warts and it is possible treatment may not be successful.  Treatment risks include discoloration; scarring and there is still potential for wart recurrence.  May use OTC wart remover to thin out dead layer of skin on top.  Discussed that regular treatments are needed every 4-6 weeks until wart is destroyed. May take 6-12 months. Patient wants to proceed with treatment Destruction of lesion - R plantar foot Complexity: simple   Destruction method: cryotherapy   Informed consent: discussed and consent obtained   Timeout:  patient name, date of birth, surgical site, and procedure verified Lesion destroyed using liquid nitrogen: Yes   Region frozen until ice ball extended beyond lesion: Yes   Outcome: patient tolerated procedure well with no complications   Post-procedure details: wound care instructions given   MULTIPLE BENIGN NEVI   LENTIGINES   ACTINIC ELASTOSIS   SEBORRHEIC KERATOSES   CHERRY ANGIOMA   TINEA PEDIS OF BOTH FEET   ONYCHOMYCOSIS   SPIDER VEINS    Varicose Veins/Spider Veins - Dilated blue, purple or red veins at the lower extremities - Reassured - Smaller vessels can be treated by sclerotherapy (a procedure to inject a medicine into the veins to make them disappear) if desired, but the  treatment is not covered by insurance. Larger vessels may be covered if symptomatic and we would refer to vascular surgeon if treatment desired.  TINEA PEDIS with tinea unguium Exam: Red scaly patches of b/l plantar feet and subungual debris  Plan: Discussed topical vs oral treatment. Patient defers treatment  Return for wart follow up in 4-6 weeks.  Arlinda Lais, CMA, am acting as scribe for Harris Liming, MD .   Documentation: I have reviewed the above documentation for accuracy and completeness, and I agree with the above.  Harris Liming, MD

## 2023-07-03 NOTE — Patient Instructions (Signed)

## 2023-07-06 ENCOUNTER — Ambulatory Visit
Admission: RE | Admit: 2023-07-06 | Discharge: 2023-07-06 | Disposition: A | Discharge status: Home / Self Care | Source: Ambulatory Visit | Attending: Physician Assistant | Admitting: Physician Assistant

## 2023-07-06 ENCOUNTER — Other Ambulatory Visit

## 2023-07-06 ENCOUNTER — Ambulatory Visit
Admission: RE | Admit: 2023-07-06 | Discharge: 2023-07-06 | Disposition: A | Discharge status: Home / Self Care | Source: Ambulatory Visit | Attending: Physician Assistant

## 2023-07-06 DIAGNOSIS — N6489 Other specified disorders of breast: Secondary | ICD-10-CM | POA: Diagnosis not present

## 2023-07-06 DIAGNOSIS — N6323 Unspecified lump in the left breast, lower outer quadrant: Secondary | ICD-10-CM | POA: Diagnosis not present

## 2023-07-06 DIAGNOSIS — R92323 Mammographic fibroglandular density, bilateral breasts: Secondary | ICD-10-CM | POA: Diagnosis not present

## 2023-07-06 DIAGNOSIS — N6320 Unspecified lump in the left breast, unspecified quadrant: Secondary | ICD-10-CM | POA: Diagnosis not present

## 2023-07-09 ENCOUNTER — Ambulatory Visit: Admitting: Physician Assistant

## 2023-07-09 ENCOUNTER — Telehealth: Payer: Self-pay

## 2023-07-09 VITALS — BP 130/70 | HR 74 | Temp 98.0°F | Resp 16 | Ht 64.0 in | Wt 160.0 lb

## 2023-07-09 DIAGNOSIS — R319 Hematuria, unspecified: Secondary | ICD-10-CM

## 2023-07-09 DIAGNOSIS — E782 Mixed hyperlipidemia: Secondary | ICD-10-CM

## 2023-07-09 DIAGNOSIS — E559 Vitamin D deficiency, unspecified: Secondary | ICD-10-CM

## 2023-07-09 DIAGNOSIS — R300 Dysuria: Secondary | ICD-10-CM | POA: Diagnosis not present

## 2023-07-09 DIAGNOSIS — N390 Urinary tract infection, site not specified: Secondary | ICD-10-CM

## 2023-07-09 LAB — POCT URINALYSIS DIPSTICK
Bilirubin, UA: NEGATIVE
Glucose, UA: NEGATIVE
Ketones, UA: NEGATIVE
Nitrite, UA: NEGATIVE
Protein, UA: NEGATIVE
Spec Grav, UA: 1.01 (ref 1.010–1.025)
Urobilinogen, UA: 2 U/dL — AB
pH, UA: 5 (ref 5.0–8.0)

## 2023-07-09 MED ORDER — CIPROFLOXACIN HCL 500 MG PO TABS: 500.0000 mg | ORAL_TABLET | Freq: Two times a day (BID) | ORAL | 0 refills | Status: DC

## 2023-07-09 MED ORDER — LOVASTATIN 20 MG PO TABS: 20.0000 mg | ORAL_TABLET | Freq: Every day | ORAL | 3 refills | Status: DC

## 2023-07-09 NOTE — Progress Notes (Signed)
 [facility] [address] [place], [place] [address]  Internal MEDICINE  Office Visit Note  Patient Name: Tracy Baker  [id number]  [id number]  Date of Service: 07/25/2023  Chief Complaint  Patient presents with   Follow-up    Labs     HPI Pt is here for routine follow up -Labs reviewed: Vit D low--OTC supplement, LDL elevated (has not been taking lovastatin  and will restart), A1c in prediabetic -Derm removed lesion from abdomen that is tender still. -dull ache on right flank for the last month, but then was gone past 2 days.  -may be MSK related, but will check urine as well. May use tylenol prn. Call if new or worsening symptoms. Could consider renal US . Discussed staying well hydrated - uses zofran  as needed, gets nauseous with vertigo occasionally with weather changes -mammogram done--left showing stable extracapsular silicone but was told no concerns with this and to continue annual screening. -will plan for bone density  Current Medication: Outpatient Encounter Medications as of 07/09/2023  Medication Sig   clonazePAM  (KLONOPIN ) 0.5 MG tablet Take 1/2 to 1 tablet by mouth daily as needed for anxiety   ondansetron  (ZOFRAN ) 4 MG tablet Take 1 tablet (4 mg total) by mouth every 8 (eight) hours as needed for nausea or vomiting.   [DISCONTINUED] ciprofloxacin  (CIPRO ) 500 MG tablet Take 500 mg by mouth 2 (two) times daily.   [DISCONTINUED] lovastatin  (MEVACOR ) 20 MG tablet Take 20 mg by mouth at bedtime.   [DISCONTINUED] nitrofurantoin, macrocrystal-monohydrate, (MACROBID) 100 MG capsule Take 100 mg by mouth 2 (two) times daily.   ciprofloxacin  (CIPRO ) 500 MG tablet Take 1 tablet (500 mg total) by mouth 2 (two) times daily. (Patient not taking: Reported on 07/16/2023)   lovastatin  (MEVACOR ) 20 MG tablet Take 1 tablet (20 mg total) by mouth at bedtime.   No facility-administered encounter medications on file as of 07/09/2023.    Surgical History: Past Surgical History:   Procedure Laterality Date   AUGMENTATION MAMMAPLASTY Bilateral 1981   removed in 2006   BREAST IMPLANT REMOVAL Bilateral 2007   CHOLECYSTECTOMY  2010   COLONOSCOPY WITH PROPOFOL  N/A 06/30/2021   Procedure: COLONOSCOPY WITH PROPOFOL ;  Surgeon: Marnee Sink, MD;  Location: [facility] ENDOSCOPY;  Service: Endoscopy;  Laterality: N/A;   LAPAROSCOPIC TOTAL HYSTERECTOMY  2006    Medical History: Past Medical History:  Diagnosis Date   Thyroid  disease     Family History: Family History  Problem Relation Age of Onset   Hypertension Mother    Diabetes Father    Breast cancer Neg Hx     Social History   Socioeconomic History   Marital status: Single    Spouse name: Not on file   Number of children: Not on file   Years of education: Not on file   Highest education level: Not on file  Occupational History   Not on file  Tobacco Use   Smoking status: Every Day    Current packs/day: 1.00    Types: Cigarettes   Smokeless tobacco: Never   Tobacco comments:    About 15-20 cigarettes a day  Substance and Sexual Activity   Alcohol use: Yes   Drug use: Never   Sexual activity: Not Currently  Other Topics Concern   Not on file  Social History Narrative   Not on file   Social Drivers of Health   Financial Resource Strain: Not on file  Food Insecurity: Not on file  Transportation Needs: Not on file  Physical  Activity: Not on file  Stress: Not on file  Social Connections: Not on file  Intimate Partner Violence: Not on file      Review of Systems  Constitutional:  Negative for chills, fatigue and unexpected weight change.  HENT:  Negative for congestion, rhinorrhea, sneezing and sore throat.   Eyes:  Negative for redness.  Respiratory:  Negative for cough, chest tightness and shortness of breath.   Cardiovascular:  Negative for chest pain and palpitations.  Gastrointestinal:  Negative for abdominal pain, constipation, diarrhea, nausea and vomiting.  Genitourinary:  Positive  for flank pain. Negative for dysuria and frequency.  Musculoskeletal:  Negative for arthralgias, back pain, joint swelling and neck pain.  Skin:  Negative for rash.       Skin lesions  Neurological: Negative.  Negative for tremors and numbness.  Hematological:  Negative for adenopathy. Does not bruise/bleed easily.  Psychiatric/Behavioral:  Positive for sleep disturbance. Negative for behavioral problems (Depression) and suicidal ideas. The patient is nervous/anxious.     Vital Signs: BP 130/70   Pulse 74   Temp 98 F (36.7 C)   Resp 16   Ht 5\' 4"  (1.626 m)   Wt 160 lb (72.6 kg)   SpO2 97%   BMI 27.46 kg/m    Physical Exam Vitals and nursing note reviewed.  Constitutional:      Appearance: Normal appearance.  HENT:     Head: Normocephalic and atraumatic.  Eyes:     Extraocular Movements: Extraocular movements intact.  Cardiovascular:     Rate and Rhythm: Normal rate and regular rhythm.  Pulmonary:     Effort: Pulmonary effort is normal.     Breath sounds: Normal breath sounds.  Abdominal:     Tenderness: There is no right CVA tenderness or left CVA tenderness.  Skin:    General: Skin is warm and dry.  Neurological:     General: No focal deficit present.     Mental Status: She is alert.  Psychiatric:        Mood and Affect: Mood normal.        Behavior: Behavior normal.        Assessment/Plan: 1. Mixed hyperlipidemia (Primary) Restart lovastatin  - lovastatin  (MEVACOR ) 20 MG tablet; Take 1 tablet (20 mg total) by mouth at bedtime.  Dispense: 90 tablet; Refill: 3  2. Vitamin D  deficiency Start Vit D supplement  3. Urinary tract infection with hematuria, site unspecified Will start cipro  and adjust based on C/S. Drink plenty of fluids - CULTURE, URINE COMPREHENSIVE  4. Dysuria - POCT Urinalysis Dipstick   General Counseling: anola mcgough understanding of the findings of todays visit and agrees with plan of treatment. I have discussed any further  diagnostic evaluation that may be needed or ordered today. We also reviewed her medications today. she has been encouraged to call the office with any questions or concerns that should arise related to todays visit.    Orders Placed This Encounter  Procedures   CULTURE, URINE COMPREHENSIVE   POCT Urinalysis Dipstick    Meds ordered this encounter  Medications   lovastatin  (MEVACOR ) 20 MG tablet    Sig: Take 1 tablet (20 mg total) by mouth at bedtime.    Dispense:  90 tablet    Refill:  3   ciprofloxacin  (CIPRO ) 500 MG tablet    Sig: Take 1 tablet (500 mg total) by mouth 2 (two) times daily.    Dispense:  10 tablet    Refill:  0  This patient was seen by Taylor Favia, PA-C in collaboration with Dr. Verneta Gone as a part of collaborative care agreement.   Total time spent:30 Minutes Time spent includes review of chart, medications, test results, and follow up plan with the patient.      Dr Fozia M Khan Internal medicine

## 2023-07-09 NOTE — Telephone Encounter (Signed)
 Pt advised we sent Cipro for UTI and we still waiting for culture

## 2023-07-10 LAB — SURGICAL PATHOLOGY

## 2023-07-11 ENCOUNTER — Telehealth: Payer: Self-pay

## 2023-07-11 ENCOUNTER — Encounter: Payer: Self-pay | Admitting: Dermatology

## 2023-07-11 ENCOUNTER — Other Ambulatory Visit: Payer: Self-pay | Admitting: Physician Assistant

## 2023-07-11 MED ORDER — MECLIZINE HCL 12.5 MG PO TABS: 12.5000 mg | ORAL_TABLET | Freq: Three times a day (TID) | ORAL | 0 refills | Status: DC | PRN

## 2023-07-11 NOTE — Telephone Encounter (Signed)
 Patient has been notified

## 2023-07-12 LAB — CULTURE, URINE COMPREHENSIVE

## 2023-07-16 ENCOUNTER — Encounter: Payer: Self-pay | Admitting: Dermatology

## 2023-07-16 ENCOUNTER — Ambulatory Visit: Admitting: Dermatology

## 2023-07-16 DIAGNOSIS — L820 Inflamed seborrheic keratosis: Secondary | ICD-10-CM | POA: Diagnosis not present

## 2023-07-16 MED ORDER — DOXYCYCLINE HYCLATE 100 MG PO CAPS: ORAL_CAPSULE | ORAL | 0 refills | Status: DC

## 2023-07-16 NOTE — Progress Notes (Signed)
   Follow-Up Visit   Subjective  Tracy Baker is a 68 y.o. female who presents for the following: Patient concerned that biopsy site may be infected. She was prescribed Cipro  500 mg po BID x 5 days for UTI by PCP, but did not notice an improvement in appearance of wound, and area is tender. Patient is applying Vaseline to aa daily and Neosporin.   The following portions of the chart were reviewed this encounter and updated as appropriate: medications, allergies, medical history  Review of Systems:  No other skin or systemic complaints except as noted in HPI or Assessment and Plan.  Objective  Well appearing patient in no apparent distress; mood and affect are within normal limits.   A focused examination was performed of the following areas: the abdomen   Relevant exam findings are noted in the Assessment and Plan.  R abdomen Biopsy wound with eschar at base and erythematous inflamed border with collarette of scale   Assessment & Plan     INFLAMED SEBORRHEIC KERATOSIS R abdomen Bx proven ISK 07/03/23 - may be irritated by Neosporin vs bacterial infection  Start Doxycycline (Vibramycin) 100 mg po BID x 7 days. Doxycycline should be taken with food to prevent nausea. Do not lay down for 30 minutes after taking. Be cautious with sun exposure and use good sun protection while on this medication. Pregnant women should not take this medication.   Stop neosporin  Aerobic culture Anaerobic and Aerobic Culture - R abdomen  Return for appointment as scheduled.  Arlinda Lais, CMA, am acting as scribe for Harris Liming, MD .  Documentation: I have reviewed the above documentation for accuracy and completeness, and I agree with the above.  Harris Liming, MD

## 2023-07-16 NOTE — Patient Instructions (Addendum)

## 2023-07-17 ENCOUNTER — Telehealth: Payer: Self-pay

## 2023-07-17 NOTE — Telephone Encounter (Signed)
 Pt  called that she finished antibiotic still having back pain as per advised her that urine culture is showed mixed flora advised to drink plenty for water and cranberry juice also she said that she cannot take meclizine  due to its had dye advised next option ENT she will call us  back need referral

## 2023-07-21 LAB — ANAEROBIC AND AEROBIC CULTURE

## 2023-07-22 ENCOUNTER — Ambulatory Visit: Payer: Self-pay | Admitting: Dermatology

## 2023-07-23 NOTE — Telephone Encounter (Signed)
 Patient advised culture did not show any bacteria growth. She has 2 days left of doxycycline  and wants to know if she should finish it. Patient also said that bx site is still red but may be a little smaller. Sue Em., RMA

## 2023-07-23 NOTE — Telephone Encounter (Signed)
-----   Message from Atkins sent at 07/22/2023  4:09 PM EDT ----- Anaerobic Culture Final report Result 1 Comment Comment: No anaerobic growth in 72 hours. Aerobic Culture Final report Result 1 Comment Comment: No growth in 36 - 48 hours.  Plan: please call to share biopsy wound did not grow any bacteria

## 2023-07-31 ENCOUNTER — Ambulatory Visit: Admitting: Dermatology

## 2023-11-08 ENCOUNTER — Telehealth: Payer: Self-pay | Admitting: Physician Assistant

## 2023-11-08 ENCOUNTER — Ambulatory Visit (INDEPENDENT_AMBULATORY_CARE_PROVIDER_SITE_OTHER): Admitting: Physician Assistant

## 2023-11-08 ENCOUNTER — Encounter: Payer: Self-pay | Admitting: Physician Assistant

## 2023-11-08 VITALS — BP 130/80 | HR 74 | Temp 97.8°F | Resp 16 | Ht 64.0 in | Wt 159.0 lb

## 2023-11-08 DIAGNOSIS — G8929 Other chronic pain: Secondary | ICD-10-CM | POA: Diagnosis not present

## 2023-11-08 DIAGNOSIS — R300 Dysuria: Secondary | ICD-10-CM | POA: Diagnosis not present

## 2023-11-08 DIAGNOSIS — R109 Unspecified abdominal pain: Secondary | ICD-10-CM | POA: Diagnosis not present

## 2023-11-08 DIAGNOSIS — R1031 Right lower quadrant pain: Secondary | ICD-10-CM | POA: Diagnosis not present

## 2023-11-08 DIAGNOSIS — K219 Gastro-esophageal reflux disease without esophagitis: Secondary | ICD-10-CM

## 2023-11-08 DIAGNOSIS — R319 Hematuria, unspecified: Secondary | ICD-10-CM | POA: Diagnosis not present

## 2023-11-08 LAB — POCT URINALYSIS DIPSTICK
Bilirubin, UA: NEGATIVE
Glucose, UA: NEGATIVE
Ketones, UA: NEGATIVE
Nitrite, UA: NEGATIVE
Protein, UA: NEGATIVE
Spec Grav, UA: 1.01 (ref 1.010–1.025)
Urobilinogen, UA: 0.2 U/dL
pH, UA: 5 (ref 5.0–8.0)

## 2023-11-08 MED ORDER — ONDANSETRON HCL 4 MG PO TABS: 4.0000 mg | ORAL_TABLET | Freq: Three times a day (TID) | ORAL | 0 refills | Status: DC | PRN

## 2023-11-08 MED ORDER — PANTOPRAZOLE SODIUM 20 MG PO TBEC: 20.0000 mg | DELAYED_RELEASE_TABLET | Freq: Every day | ORAL | 2 refills | Status: DC

## 2023-11-08 NOTE — Progress Notes (Addendum)
 [facility] [address] [place], [place] [address]  Internal MEDICINE  Office Visit Note  Patient Name: Tracy Baker  [id number]  [id number]  Date of Service: 11/08/2023  Chief Complaint  Patient presents with   Follow-up   Urinary Tract Infection   Back Pain    HPI Pt is here for routine follow up -cramping in lower right quadrant, feels like menstrual cramps intermittently. Hx of right flank pain intermittently as well. Has been going on for months. Last urine culture negative -no burning or discomfort with urinating, but thought it looked a little pink urine last few days and urine today does show blood -hx of kidney stones on the right side -hx of appendectomy and cholecystectomy -does have poor sleep, working another 1/2 day now that she isnt watching grandson -only takes 1/2 klonopin  as needed -will get nauseous frequently, will get that with vertigo, but often will have this first thing in AM with first meal. She does have some reflux and had possible concern for hiatal hernia previously which could contribute -will get CT to evaluate further  Current Medication: Outpatient Encounter Medications as of 11/08/2023  Medication Sig   ciprofloxacin  (CIPRO ) 500 MG tablet Take 1 tablet (500 mg total) by mouth 2 (two) times daily.   clonazePAM  (KLONOPIN ) 0.5 MG tablet Take 1/2 to 1 tablet by mouth daily as needed for anxiety   lovastatin  (MEVACOR ) 20 MG tablet Take 1 tablet (20 mg total) by mouth at bedtime.   meclizine  (ANTIVERT ) 12.5 MG tablet Take 1 tablet (12.5 mg total) by mouth 3 (three) times daily as needed for dizziness.   pantoprazole  (PROTONIX ) 20 MG tablet Take 1 tablet (20 mg total) by mouth daily.   [DISCONTINUED] doxycycline  (VIBRAMYCIN ) 100 MG capsule Take one cap po BID with 7 days. Take with food, not with dairy.   [DISCONTINUED] ondansetron  (ZOFRAN ) 4 MG tablet Take 1 tablet (4 mg total) by mouth every 8 (eight) hours as needed for nausea or  vomiting.   ondansetron  (ZOFRAN ) 4 MG tablet Take 1 tablet (4 mg total) by mouth every 8 (eight) hours as needed for nausea or vomiting.   No facility-administered encounter medications on file as of 11/08/2023.    Surgical History: Past Surgical History:  Procedure Laterality Date   AUGMENTATION MAMMAPLASTY Bilateral 1981   removed in 2006   BREAST IMPLANT REMOVAL Bilateral 2007   CHOLECYSTECTOMY  2010   COLONOSCOPY WITH PROPOFOL  N/A 06/30/2021   Procedure: COLONOSCOPY WITH PROPOFOL ;  Surgeon: Jinny Carmine, MD;  Location: [facility] ENDOSCOPY;  Service: Endoscopy;  Laterality: N/A;   LAPAROSCOPIC TOTAL HYSTERECTOMY  2006    Medical History: Past Medical History:  Diagnosis Date   Thyroid  disease     Family History: Family History  Problem Relation Age of Onset   Hypertension Mother    Diabetes Father    Breast cancer Neg Hx     Social History   Socioeconomic History   Marital status: Single    Spouse name: Not on file   Number of children: Not on file   Years of education: Not on file   Highest education level: Not on file  Occupational History   Not on file  Tobacco Use   Smoking status: Every Day    Current packs/day: 1.00    Types: Cigarettes   Smokeless tobacco: Never   Tobacco comments:    About 15-20 cigarettes a day  Substance and Sexual Activity   Alcohol use: Yes   Drug use:  Never   Sexual activity: Not Currently  Other Topics Concern   Not on file  Social History Narrative   Not on file   Social Drivers of Health   Financial Resource Strain: Not on file  Food Insecurity: Not on file  Transportation Needs: Not on file  Physical Activity: Not on file  Stress: Not on file  Social Connections: Not on file  Intimate Partner Violence: Not on file      Review of Systems  Constitutional:  Negative for chills, fatigue and unexpected weight change.  HENT:  Negative for congestion, rhinorrhea, sneezing and sore throat.   Eyes:  Negative for redness.   Respiratory:  Negative for cough, chest tightness and shortness of breath.   Cardiovascular:  Negative for chest pain and palpitations.  Gastrointestinal:  Positive for abdominal pain and nausea. Negative for constipation and vomiting.  Genitourinary:  Positive for flank pain, frequency and hematuria. Negative for dysuria.  Musculoskeletal:  Negative for arthralgias, back pain, joint swelling and neck pain.  Skin:  Negative for rash.       Skin lesions  Neurological: Negative.  Negative for tremors and numbness.  Hematological:  Negative for adenopathy. Does not bruise/bleed easily.  Psychiatric/Behavioral:  Positive for sleep disturbance. Negative for behavioral problems (Depression) and suicidal ideas. The patient is nervous/anxious.     Vital Signs: BP 130/80   Pulse 74   Temp 97.8 F (36.6 C)   Resp 16   Ht 5' 4 (1.626 m)   Wt 159 lb (72.1 kg)   SpO2 96%   BMI 27.29 kg/m    Physical Exam Vitals and nursing note reviewed.  Constitutional:      Appearance: Normal appearance.  HENT:     Head: Normocephalic and atraumatic.  Eyes:     Extraocular Movements: Extraocular movements intact.  Cardiovascular:     Rate and Rhythm: Normal rate and regular rhythm.  Pulmonary:     Effort: Pulmonary effort is normal.     Breath sounds: Normal breath sounds.  Abdominal:     Tenderness: There is no abdominal tenderness.  Skin:    General: Skin is warm and dry.  Neurological:     General: No focal deficit present.     Mental Status: She is alert.  Psychiatric:        Mood and Affect: Mood normal.        Behavior: Behavior normal.        Assessment/Plan: 1. Chronic flank pain (Primary) Will order Ct for further evaluation, may have stone - CT ABDOMEN PELVIS WO CONTRAST; Future  2. Right lower quadrant abdominal pain May have stone, will get CT for further evaluation - CT ABDOMEN PELVIS WO CONTRAST; Future  3. Hematuria, unspecified type - CT ABDOMEN PELVIS WO  CONTRAST; Future  4. Dysuria - POCT Urinalysis Dipstick - CULTURE, URINE COMPREHENSIVE  5. Gastroesophageal reflux disease, unspecified whether esophagitis present Will start with pantoprazole  daily, may need to increase dose if not helping. Will order CT which may also show if hiatal hernia contributing - pantoprazole  (PROTONIX ) 20 MG tablet; Take 1 tablet (20 mg total) by mouth daily.  Dispense: 30 tablet; Refill: 2 - CT ABDOMEN PELVIS WO CONTRAST; Future   General Counseling: olayinka gathers understanding of the findings of todays visit and agrees with plan of treatment. I have discussed any further diagnostic evaluation that may be needed or ordered today. We also reviewed her medications today. she has been encouraged to call the office with  any questions or concerns that should arise related to todays visit.    Orders Placed This Encounter  Procedures   CULTURE, URINE COMPREHENSIVE   CT ABDOMEN PELVIS WO CONTRAST   POCT Urinalysis Dipstick    Meds ordered this encounter  Medications   ondansetron  (ZOFRAN ) 4 MG tablet    Sig: Take 1 tablet (4 mg total) by mouth every 8 (eight) hours as needed for nausea or vomiting.    Dispense:  20 tablet    Refill:  0   pantoprazole  (PROTONIX ) 20 MG tablet    Sig: Take 1 tablet (20 mg total) by mouth daily.    Dispense:  30 tablet    Refill:  2    This patient was seen by Tinnie Pro, PA-C in collaboration with Dr. Sigrid Bathe as a part of collaborative care agreement.   Total time spent:30 Minutes Time spent includes review of chart, medications, test results, and follow up plan with the patient.      Dr Fozia M Khan Internal medicine

## 2023-11-08 NOTE — Telephone Encounter (Signed)
 Lvm notifying patient of CT appointment date, arrival time, location-[place]

## 2023-11-08 NOTE — Telephone Encounter (Signed)
 Pt advised  that we fix it

## 2023-11-13 ENCOUNTER — Ambulatory Visit: Payer: Self-pay | Admitting: Physician Assistant

## 2023-11-13 DIAGNOSIS — N2889 Other specified disorders of kidney and ureter: Secondary | ICD-10-CM

## 2023-11-13 DIAGNOSIS — N200 Calculus of kidney: Secondary | ICD-10-CM

## 2023-11-13 DIAGNOSIS — G8929 Other chronic pain: Secondary | ICD-10-CM

## 2023-11-13 LAB — CULTURE, URINE COMPREHENSIVE

## 2023-11-14 NOTE — Telephone Encounter (Signed)
 Spoke with patient regarding urine culture.

## 2023-11-14 NOTE — Telephone Encounter (Signed)
-----   Message from Tinnie MARLA Pro sent at 11/13/2023  3:44 PM EDT ----- Please let her know that urine culture showed normal flora. Will move forward with CT as planned ----- Message ----- From: Tobie Height Sent: 11/08/2023   8:49 AM EDT To: Tinnie MARLA Pro, PA-C

## 2023-11-16 ENCOUNTER — Ambulatory Visit
Admission: RE | Admit: 2023-11-16 | Discharge: 2023-11-16 | Disposition: A | Discharge status: Home / Self Care | Source: Ambulatory Visit | Attending: Physician Assistant | Admitting: Physician Assistant

## 2023-11-16 DIAGNOSIS — R1031 Right lower quadrant pain: Secondary | ICD-10-CM | POA: Diagnosis not present

## 2023-11-16 DIAGNOSIS — R109 Unspecified abdominal pain: Secondary | ICD-10-CM | POA: Diagnosis not present

## 2023-11-16 DIAGNOSIS — G8929 Other chronic pain: Secondary | ICD-10-CM | POA: Insufficient documentation

## 2023-11-16 DIAGNOSIS — R319 Hematuria, unspecified: Secondary | ICD-10-CM | POA: Diagnosis not present

## 2023-11-16 DIAGNOSIS — K5730 Diverticulosis of large intestine without perforation or abscess without bleeding: Secondary | ICD-10-CM | POA: Diagnosis not present

## 2023-11-16 DIAGNOSIS — K219 Gastro-esophageal reflux disease without esophagitis: Secondary | ICD-10-CM | POA: Insufficient documentation

## 2023-11-16 DIAGNOSIS — N1330 Unspecified hydronephrosis: Secondary | ICD-10-CM | POA: Diagnosis not present

## 2023-11-16 NOTE — Telephone Encounter (Signed)
-----   Message from Tinnie MARLA Pro sent at 11/16/2023 12:58 PM EDT ----- Please let her know she does have several kidney stones and one is large. I am placing a urology referral. If sudden worsening symptoms go to ED ----- Message ----- From: Interface, Rad Results In Sent: 11/16/2023  11:18 AM EDT To: Tinnie MARLA Pro, PA-C

## 2023-11-16 NOTE — Telephone Encounter (Signed)
 LVM for patient regarding urgent urology referral and to go to ED if symptoms worsen over the weekend.

## 2023-11-16 NOTE — Addendum Note (Signed)
 Addended by: Zarianna Dicarlo on: 11/16/2023 12:58 PM   Modules accepted: Orders

## 2023-11-19 ENCOUNTER — Telehealth: Payer: Self-pay | Admitting: Physician Assistant

## 2023-11-19 NOTE — Telephone Encounter (Signed)
 Per patient request, Urology referral faxed to [facility]; [phone]. Patient aware. Gave her telephone # [phone]

## 2023-11-22 ENCOUNTER — Telehealth: Payer: Self-pay | Admitting: Physician Assistant

## 2023-11-22 NOTE — Telephone Encounter (Addendum)
 S/w patient again regarding urology referral that was faxed to [facility]. Patient stated she had not heard anything from them and requested referral be sent to [facility] since she needs appointment before she goes out of town. I asked her if she had called [facility] for appointment like I had advised her to due when I spoke with her 11/19/23. She stated she forgot that I had told her. I gave her their telephone # again-Toni   Per patient's request, urology referral faxed to [facility]; [phone]

## 2023-11-28 ENCOUNTER — Telehealth: Payer: Self-pay | Admitting: Physician Assistant

## 2023-11-28 NOTE — Telephone Encounter (Signed)
 Patient called regarding urology referral again. Stated she has not heard from [facility] and she needs her appointment before she goes out of town. I asked her if she had called them like I had instructed her to. She stated she had not. I told her to go ahead and call them to see if they can get her scheduled. She then asked me if I new who could get her in asap. I told her since these are specialists, it may take a while before they can see her, and that I am unable to call around to all the urologist in the area to see who can get her in asap. She will call [facility], then let me know-Toni

## 2023-12-05 DIAGNOSIS — N200 Calculus of kidney: Secondary | ICD-10-CM | POA: Diagnosis not present

## 2023-12-05 DIAGNOSIS — N12 Tubulo-interstitial nephritis, not specified as acute or chronic: Secondary | ICD-10-CM

## 2023-12-05 DIAGNOSIS — B377 Candidal sepsis: Secondary | ICD-10-CM

## 2023-12-05 HISTORY — DX: Candidal sepsis: B37.7

## 2023-12-05 HISTORY — DX: Tubulo-interstitial nephritis, not specified as acute or chronic: N12

## 2023-12-14 DIAGNOSIS — Z79899 Other long term (current) drug therapy: Secondary | ICD-10-CM | POA: Diagnosis not present

## 2023-12-14 DIAGNOSIS — K5790 Diverticulosis of intestine, part unspecified, without perforation or abscess without bleeding: Secondary | ICD-10-CM | POA: Diagnosis not present

## 2023-12-14 DIAGNOSIS — Z885 Allergy status to narcotic agent status: Secondary | ICD-10-CM | POA: Diagnosis not present

## 2023-12-14 DIAGNOSIS — N209 Urinary calculus, unspecified: Secondary | ICD-10-CM | POA: Diagnosis not present

## 2023-12-14 DIAGNOSIS — I700 Atherosclerosis of aorta: Secondary | ICD-10-CM | POA: Diagnosis not present

## 2023-12-14 DIAGNOSIS — N200 Calculus of kidney: Secondary | ICD-10-CM | POA: Diagnosis not present

## 2023-12-14 DIAGNOSIS — F17210 Nicotine dependence, cigarettes, uncomplicated: Secondary | ICD-10-CM | POA: Diagnosis not present

## 2023-12-14 DIAGNOSIS — K219 Gastro-esophageal reflux disease without esophagitis: Secondary | ICD-10-CM | POA: Diagnosis not present

## 2023-12-18 DIAGNOSIS — N200 Calculus of kidney: Secondary | ICD-10-CM | POA: Diagnosis not present

## 2023-12-22 DIAGNOSIS — N12 Tubulo-interstitial nephritis, not specified as acute or chronic: Secondary | ICD-10-CM | POA: Diagnosis not present

## 2023-12-22 DIAGNOSIS — R1084 Generalized abdominal pain: Secondary | ICD-10-CM | POA: Diagnosis not present

## 2023-12-22 DIAGNOSIS — R509 Fever, unspecified: Secondary | ICD-10-CM | POA: Diagnosis not present

## 2023-12-23 DIAGNOSIS — R7401 Elevation of levels of liver transaminase levels: Secondary | ICD-10-CM | POA: Diagnosis not present

## 2023-12-23 DIAGNOSIS — N179 Acute kidney failure, unspecified: Secondary | ICD-10-CM | POA: Diagnosis not present

## 2023-12-23 DIAGNOSIS — N12 Tubulo-interstitial nephritis, not specified as acute or chronic: Secondary | ICD-10-CM | POA: Diagnosis not present

## 2023-12-24 DIAGNOSIS — R7401 Elevation of levels of liver transaminase levels: Secondary | ICD-10-CM | POA: Diagnosis not present

## 2023-12-24 DIAGNOSIS — N12 Tubulo-interstitial nephritis, not specified as acute or chronic: Secondary | ICD-10-CM | POA: Diagnosis not present

## 2023-12-24 DIAGNOSIS — I499 Cardiac arrhythmia, unspecified: Secondary | ICD-10-CM | POA: Diagnosis not present

## 2023-12-24 DIAGNOSIS — J984 Other disorders of lung: Secondary | ICD-10-CM | POA: Diagnosis not present

## 2023-12-24 DIAGNOSIS — N179 Acute kidney failure, unspecified: Secondary | ICD-10-CM | POA: Diagnosis not present

## 2023-12-24 NOTE — Discharge Summary (Addendum)
 Discharge Summary General Internal Medicine [facility]    Patient Name: Tracy Baker Age: 68 y.o. Sex: female MRN: [id number] Admission Date: 12/22/2023 Discharge Date: 12/28/2023  Attending Physician at discharge: Dr. GEANNIE Kidney Service team: GEN MED: [id number]]   Chief Complaint Chief Complaint  Patient presents with  . Flank Pain    Pt had a lithotripsy on 12/14/2023. Has had right flank pain since the procedure with nausea and vomiting with hematuria. Has been having chills, fevers, sweating. Ill appearing     Admitting Diagnosis  Pyelonephritis [N12] Secondary Diagnoses:  Complicated pyelonephritis w/ candidemia Residual stone burden Acute anemia AKI 2/2 poor oral intake Right lower lobe consolidation Anxiety  Summary Statement Tracy Baker is a 68 year old female with PMHx of nephrolithiasis, current smoker, recent lithotripsy (10/10) and ureteral stent removal (10/14) at [facility] who presents to [facility] while visiting in [place] with n/v found to have pyleonephritis on CTAP. Urine cultures and blood cultures were positive for candida, now on PO fluconazole 400mg  daily. Patient's repeat blood ctx from 10/20 were positive for fungus (1 of 2 bottles). TTE neg for endocarditis. Repeat ctx from 10/22 NGTD. Cultures drawn on day of discharge (10/24). Patient req to leave AMA as she prefers to have care at a hospital facility in [facility].   HPI Tracy Baker is a 68yo female hx of recent kidney stone s/p lithotripsy 10/10 and stent removal 10/14 at [facility] admitted for concerns for pyelonephritis.  Per chart review and patient/family patient has severe right sided flank pain and dysuria.  Had large kidney stone seen on outside hospital imaging.  On 10/10 had lithotripsy done successfully and stent removed on 10/14.  Following stent removal patient had extreme right lower quadrant and right flank pain.  She has also had hematuria, lethargy, nausea, and decreased appetite  since the surgery.  Patient and family were in town for her son's wedding this weekend when yesterday she had extreme fatigue and was not overall not feeling well.  Today she stood up and almost passed out per her son.  Per family she has not eaten or drink much in the last 3 to 4 days due to nausea and general malaise.  She had a temperature to 99.7 in the ED but has not checked temperature at home.  Patient denies any vomiting, constipation, diarrhea, dysuria, increased urinary frequency, urinary retention.  States she has been urinating as normal but has had some blood in her urine.    Labs in ED significant for sodium of 132, bicarb of 19, anion gap of 15, creatinine of 1.3.  ALT of 92 AST of 28, and alk phos of 308.  WBC 9.86, HGB 14.4.  UA with large RBC, 20 ketones, 500 leuk esterase, negative nitrites, many white blood cells, many bacteria.  Urine culture pending.  CTAP with contrast significant for right pyelonephritis with diffuse inflammatory changes of the right ureter and multifocal areas of increased intraluminal density concerning for pyelonephrosis.  Remaining 2 mm stone in the dependent portion of the bladder.  Recommended urology consult.  Patient was treated in the ED with one-time dose ceftriaxone and 1 L of LR.    Medical history: No known medical conditions per patient and family Surgical history: Previous endometriosis surgery, no significant hospitalizations Social history: Denies alcohol use, smokes 10 to 20 cigarettes/day-has not smoked 1-2 days prior to admission, did not request nicotine supplements  Hospital Course by Problem Complicated pyelonephritis w/ Candidemia Residual stone burden Candida albicans fungemia  History of nephrolithiasis, recent lithotripsy on 10/10 and ureteral stent removal on 10/14 at [facility], now presenting with clinical concern for pyelonephritis. UA on admission reveals significant pyuria and hematuria, but normal peripheral WBC and renal function.  Started on ceftriaxone in the ED. CTAP demonstrated right pyelonephritis, diffuse inflammatory changes of right ureter, multifocal areas of increased intraluminal density concerning for pyonephrosis, and a 2 mm bladder stone likely related to recent lithotripsy. Pyonephrosis on CTAP represents infection and obstruction. Urine culture revealing budding yeast and blood culture revealing Candida albicans. Urology recommended broadening from ceftriaxone to Zosyn, starting IV diflucan for candidemia. , and to place foley for bladder decompression.   Microbiology and Pathology: 10/22 Blood culture: Pending 10/20 Blood culture: Budding yeast 10/19 Urine culture: 0-25k Yeast 10/18 Blood culture: Candida albicans 1/2  10/18 Urine culture: Budding yeast   AKI 2/2 poor oral intake Patient presented w/ nausea for several days and decreased PO intake indicating a likely hypovolemic state. Received 1L in ED and continuous fluids. PO intake improved throughout admission. Was on zofran  PRN for nausea.   Right lower lobe consolidation Revealed on CT. Patient has a cough but otherwise asymptomatic. Empirically treated for CAP with CTX and azithromycin  Acute anemia Significant hemoglobin drop (13 -> 8.8 -> 11.3) in the context of infection and recent procedures; possible hemorrhagic pyelonephritis, procedural complication, or sepsis-related hemolysis, related to candidemia.   Consultations During Hospitalizations IP CONSULT TO UROLOGY IP CONSULT TO [facility]H AOD IP CONSULT TO ADULT VASCULAR NURSING IP CONSULT TO INFECTIOUS DISEASES  Significant Diagnostic Studies:  Radiology:  1 XR Chest 2 Views Result Date: 12/24/2023 IMPRESSION:   Right lower lung airspace opacities concerning for developing pneumonia.Clinical correlation recommended.  Dictated by: Vola Solomons, MD. 12/24/2023 10:10 AM  I, Donovan Rule, MD, have reviewed the study and agree with the findings in this report.  12/24/2023 11:36 AM   1 CT  Abdomen Pelvis Wo Contrast Result Date: 12/24/2023 IMPRESSION:  Delayed right-sided nephrogram with persistent perinephric/periureteral stranding consistent with pyelonephritis and renal dysfunction. No evidence of hydronephrosis.  0.2 cm bladder stone.  Increasing right lower lobe consolidation, likely related to atelectasis, correlate with signs and symptoms of pneumonia.  Dictated by: Deven Champaneri, DO. 12/23/2023 7:44 PM  I, Waddell Reus, MD, have reviewed the study and agree with the findings in this report.  12/24/2023 10:28 AM   1 CT ABDOMEN PELVIS W CONTRAST Result Date: 12/22/2023 IMPRESSION:  Right pyelonephritis with diffuse inflammatory changes of the right ureter and multifocal areas of increased intraluminal density concerning for pyonephrosis. Urologic consultation is recommended. There is a 2 mm stone in the dependent portion of the bladder possibly related to recent lithotripsy.   Findings were discussed with Dr. Welton by Lind Presser, MD on 12/22/2023 3:03 PM.  Dictated by: Madalyn Snoddy, MD. 12/22/2023 3:03 PM  I, Fairy JONELLE Pinal, DO, have reviewed the study and agree with the findings in this report.  12/22/2023 5:11 PM    Micro:  Microbiology Results - 7 days (sorted by collection time)     Blood Culture Set 1 [[id number]]  (Abnormal) Collected: 12/22/23 1850   Lab Status: Preliminary result Specimen: Blood, Venous Updated: 12/24/23 0125    Blood Culture No bacteria or yeast isolated at 24 hours     Candida albicans*    Comment: Identified by multiplex PCR.  No resistance markers detected by multiplex PCR.       Gram Stain Aerobic Bottle Budding yeast*    Comment: This is  an appended report. These results have been appended to a previously preliminary verified report.     Narrative:     Site of Collection: left   Urine Culture [[id number]]  (Abnormal) Collected: 12/22/23 1530   Lab Status: Final result Specimen: Urine,  Clean Catch  Updated: 12/23/23 1637    Urine Culture Colony Count: >=100,000 cfu/mL Budding Yeast*    Comment: No further work up.         Colony Count: 10,000 - 24,999 cfu/mL Gram Negative Rods, Not Pseudomonas*    Comment: No further work up.      Discharge Medications   Medication List     START taking these medications      Details  fluconazole 200 mg tablet Commonly known as: Diflucan  Take 2 tablets by mouth daily for 3 days. Please present to hospital in [place]  for continued treatment. In the meantime, take 2 tablets by mouth daily for 3 days. For diagnoses: Candidemia, Fungemia Quantity: 6 tablet Refills: 0   ondansetron  4 mg tablet Commonly known as: Zofran   Take 1 tablet by mouth every 8 hours as needed. For diagnoses: Pyelonephritis Quantity: 30 tablet Refills: 0       Day of Discharge Subjective NAEON, patient denies any acute complaints, demonstrates understanding of the need to follow-up at an ED today. Pt discharged with necessary medications.    Discharge Physical Exam: Physical Exam Constitutional:      General: She is not in acute distress.    Appearance: Normal appearance. She is normal weight. She is not ill-appearing or toxic-appearing.  Eyes:     Extraocular Movements: Extraocular movements intact.  Cardiovascular:     Rate and Rhythm: Normal rate and regular rhythm.     Pulses: Normal pulses.     Heart sounds: Normal heart sounds. No murmur heard.    No friction rub. No gallop.  Pulmonary:     Effort: Pulmonary effort is normal.     Breath sounds: No stridor. No wheezing, rhonchi or rales.  Musculoskeletal:        General: Normal range of motion.  Skin:    General: Skin is warm and dry.  Neurological:     General: No focal deficit present.     Mental Status: She is alert and oriented to person, place, and time. Mental status is at baseline.  Psychiatric:        Mood and Affect: Mood normal.        Behavior: Behavior  normal.        Thought Content: Thought content normal.        Judgment: Judgment normal.      Disposition Left Against Medical Advice (AMA) primary driving factor inpatient leaving early was the desire to be close to a home/family and resources additionally, the increased financial strain that being further away from home placed on family.  Patient plans to follow-up at a hospital in the emergency department. Attending attempted transfer to [facility] hospitals on multiple occasions but was unsuccessful given patient does not require higher level of care than what can be provided at [facility].  Condition on Discharge stable  Activity Level As tolerated Complete in IP DC order set  Diet Regular diet as tolerated Complete in IP DC order set  Upcoming Appointments  Labs Pending on Discharge 10/22 Blood culture: Pending 10/20 Blood culture: Budding yeast 10/19 Urine culture: 0-25k Yeast 10/18 Blood culture: Candida albicans 1/2  10/18 Urine culture: Budding yeast  Ambulatory referrals placed:  Patient leaving AMA but referrals placed Ophthalmology Abdominal u/s LFT labs in 1 week  Transition of Care Plan / Instructions for Outpatient Providers Continue oral Diflucan 400 mg daily - follow-up at [facility] hospital as soon as possible for continued therapy and treatment Follow-up repeat blood cultures to ensure clearance; will need to follow-up for 5 days to allow for adequate fungal growth Recommend liver US  due to rising LFTs on therapy If LFT continues to rise on therapy, recommend discontinuing Fluconazole and starting Micafungin Recommend Ophthalmology evaluation due to persistent fungemia to evaluation for endophthalmitis    Patient Instructions at Discharge  Diagnosis and Treatment . You were admitted for a kidney infection (pyelonephritis) caused by a yeast called Candida, which also entered your bloodstream (fungemia) . Urology recommended a urinary catheter, but this was deferred due to  your preference . Infectious Disease started treatment with fluconazole 400 mg by mouth once daily, an antifungal medication . You require monitoring in a hospital, which will take place in [place] .  . You completed a course of ceftriaxone and azithromycin for community acquired pneumonia  Important Follow-Up: . You will need to remain in a hospital for monitoring with infectious disease, ophthalmology, and potentially cardiology; per patient/family request, this will continue at a [facility]  hospital . You were given 3 days of fungal treatment (400mg  oral fluconazole daily) while you go to your next hospital in [place]  . 1 of 2 blood cultures collected on 10/20 were positive for fungus. Your repeat blood cultures taken on 10/22 have shown no growth (as of 10/24). You had another set of cultures drawn the morning of 10/24. [facility] Infectious Disease team recommends evaluation by ophthalmology given your cultures from 10/20 grew fungus (this evaluation will be performed at your [facility]) . Your [facility] will monitor your labs and liver function while you are on fluconazole  Code Status at Time of Discharge Full Code  [facility] Referring Physician:  Primary Care Physician: No Pcp   Ozell Ancona Date: 12/24/2023 Pager: [phone]   Resident Attestation: The above note, originally authored by medical student Ozell Ancona, includes elements collected by the student in the presence of myself.  The student's contributions have been validated or modified as necessary in my note, reflecting additional personal data collection.  The analysis of this data is my own, resulting in the assessment and plan detailed above.  Elspeth CHARM Louder, M.D. Physical Medicine & Rehabilitation PGY-1 Pager # [phone] December 28, 2023  ATTENDING ATTESTATION  [facility]) I have seen, examined, and discussed this patient with the general medicine team.  I agree with the findings,  assessment, and plan in the note by the resident with the following exceptions and/or additions  The primary encounter diagnosis was Candidemia (CMS/HCC,HHS/HCC) (HHS/HCC) (CMS/HCC,HHS/HCC). Diagnoses of Pyelonephritis, Fungemia, Candiduria, Transaminitis, and AKI (acute kidney injury) were also pertinent to this visit.  Patient with recent urologic procedure at [facility] presented to [facility] on 10/18 with n/v found to have pyelonephritis and blood ctx/urine ctx positive for candida. ID was consulted and patient was placed on PO fluconazole 400mg  daily. Urology was consulted who recommended foley catheter for decompression and improvement in source control, however, patient declined due to discomfort with catheter. Patient's surveillance cultures from 10/20 were positive for fungus (1 of 2 culture bottles), with repeat cultures from 10/22 NGTD. TTE negative for endocarditis. Patient also found to have lobar pneumonia and is s/p rocephin and azithromycin for CAP coverage. Patient encouraged to remain hospitalized  while awaiting culture clearance; however, patient has significant family support in [place] and wishes to discharge to go to an ED in [place]. I attempted transfer to [place], and [facility] multiple times during the admission; however, given patient did not require escalation of care her transfer request was denied. I investigated hospitals in [place] and [place], [place], as well, however, I could not confirm they had the necessary resources for the patient (ID, ophthalmology, and cardiology if TEE needed later in treatment course). Patient discharging AMA with plans to present to ED in [place] (she wishes to remain hospitalized for treatment in a [place] facility). Our ID team recommends ophthalmology consultation to evaluate for endophthalmitis given persistently positive cultures. Of note, blood ctx were drawn on day of discharge (10/24). Patient encouraged to present to OSH ED in [place] without delay. She was provided 3 day  supply of fluconazole in case there were delays with travel (ie car breaking down, etc).  I spent 70 mins on discharge.  Laneta Manda Kidney, MD Internal Medicine Chief Resident Pager: [phone] December 28, 2023 11:38 AM     *Some images could not be shown.

## 2023-12-25 DIAGNOSIS — N179 Acute kidney failure, unspecified: Secondary | ICD-10-CM | POA: Diagnosis not present

## 2023-12-25 DIAGNOSIS — B3749 Other urogenital candidiasis: Secondary | ICD-10-CM | POA: Diagnosis not present

## 2023-12-25 DIAGNOSIS — N12 Tubulo-interstitial nephritis, not specified as acute or chronic: Secondary | ICD-10-CM | POA: Diagnosis not present

## 2023-12-25 DIAGNOSIS — R7401 Elevation of levels of liver transaminase levels: Secondary | ICD-10-CM | POA: Diagnosis not present

## 2023-12-25 DIAGNOSIS — B377 Candidal sepsis: Secondary | ICD-10-CM | POA: Diagnosis not present

## 2023-12-25 DIAGNOSIS — I499 Cardiac arrhythmia, unspecified: Secondary | ICD-10-CM | POA: Diagnosis not present

## 2023-12-26 DIAGNOSIS — B377 Candidal sepsis: Secondary | ICD-10-CM | POA: Diagnosis not present

## 2023-12-26 DIAGNOSIS — R7881 Bacteremia: Secondary | ICD-10-CM | POA: Diagnosis not present

## 2023-12-26 DIAGNOSIS — N179 Acute kidney failure, unspecified: Secondary | ICD-10-CM | POA: Diagnosis not present

## 2023-12-26 DIAGNOSIS — R7401 Elevation of levels of liver transaminase levels: Secondary | ICD-10-CM | POA: Diagnosis not present

## 2023-12-26 DIAGNOSIS — N12 Tubulo-interstitial nephritis, not specified as acute or chronic: Secondary | ICD-10-CM | POA: Diagnosis not present

## 2023-12-26 DIAGNOSIS — B3749 Other urogenital candidiasis: Secondary | ICD-10-CM | POA: Diagnosis not present

## 2023-12-27 ENCOUNTER — Ambulatory Visit: Admitting: Physician Assistant

## 2023-12-27 DIAGNOSIS — N179 Acute kidney failure, unspecified: Secondary | ICD-10-CM | POA: Diagnosis not present

## 2023-12-27 DIAGNOSIS — B3749 Other urogenital candidiasis: Secondary | ICD-10-CM | POA: Diagnosis not present

## 2023-12-27 DIAGNOSIS — R7401 Elevation of levels of liver transaminase levels: Secondary | ICD-10-CM | POA: Diagnosis not present

## 2023-12-27 DIAGNOSIS — B49 Unspecified mycosis: Secondary | ICD-10-CM | POA: Diagnosis not present

## 2023-12-27 DIAGNOSIS — B377 Candidal sepsis: Secondary | ICD-10-CM | POA: Diagnosis not present

## 2023-12-27 DIAGNOSIS — N12 Tubulo-interstitial nephritis, not specified as acute or chronic: Secondary | ICD-10-CM | POA: Diagnosis not present

## 2023-12-28 ENCOUNTER — Emergency Department (HOSPITAL_COMMUNITY)

## 2023-12-28 ENCOUNTER — Encounter (HOSPITAL_COMMUNITY): Payer: Self-pay

## 2023-12-28 ENCOUNTER — Other Ambulatory Visit: Payer: Self-pay

## 2023-12-28 ENCOUNTER — Inpatient Hospital Stay (HOSPITAL_COMMUNITY): Admission: EM | Admit: 2023-12-28 | Source: Home / Self Care | Attending: Student | Admitting: Student

## 2023-12-28 DIAGNOSIS — N1411 Contrast-induced nephropathy: Secondary | ICD-10-CM | POA: Diagnosis not present

## 2023-12-28 DIAGNOSIS — K761 Chronic passive congestion of liver: Secondary | ICD-10-CM | POA: Diagnosis present

## 2023-12-28 DIAGNOSIS — R112 Nausea with vomiting, unspecified: Secondary | ICD-10-CM | POA: Diagnosis not present

## 2023-12-28 DIAGNOSIS — R682 Dry mouth, unspecified: Secondary | ICD-10-CM | POA: Diagnosis not present

## 2023-12-28 DIAGNOSIS — R059 Cough, unspecified: Secondary | ICD-10-CM | POA: Diagnosis not present

## 2023-12-28 DIAGNOSIS — I455 Other specified heart block: Secondary | ICD-10-CM | POA: Diagnosis present

## 2023-12-28 DIAGNOSIS — Z992 Dependence on renal dialysis: Secondary | ICD-10-CM

## 2023-12-28 DIAGNOSIS — R7303 Prediabetes: Secondary | ICD-10-CM | POA: Diagnosis present

## 2023-12-28 DIAGNOSIS — I70208 Unspecified atherosclerosis of native arteries of extremities, other extremity: Secondary | ICD-10-CM | POA: Diagnosis not present

## 2023-12-28 DIAGNOSIS — Y838 Other surgical procedures as the cause of abnormal reaction of the patient, or of later complication, without mention of misadventure at the time of the procedure: Secondary | ICD-10-CM | POA: Diagnosis not present

## 2023-12-28 DIAGNOSIS — Y95 Nosocomial condition: Secondary | ICD-10-CM | POA: Diagnosis not present

## 2023-12-28 DIAGNOSIS — D696 Thrombocytopenia, unspecified: Secondary | ICD-10-CM | POA: Diagnosis present

## 2023-12-28 DIAGNOSIS — Z91041 Radiographic dye allergy status: Secondary | ICD-10-CM

## 2023-12-28 DIAGNOSIS — N179 Acute kidney failure, unspecified: Secondary | ICD-10-CM | POA: Diagnosis not present

## 2023-12-28 DIAGNOSIS — E872 Acidosis, unspecified: Secondary | ICD-10-CM | POA: Diagnosis present

## 2023-12-28 DIAGNOSIS — E43 Unspecified severe protein-calorie malnutrition: Secondary | ICD-10-CM | POA: Insufficient documentation

## 2023-12-28 DIAGNOSIS — R739 Hyperglycemia, unspecified: Secondary | ICD-10-CM | POA: Diagnosis not present

## 2023-12-28 DIAGNOSIS — R0602 Shortness of breath: Secondary | ICD-10-CM | POA: Diagnosis not present

## 2023-12-28 DIAGNOSIS — D689 Coagulation defect, unspecified: Secondary | ICD-10-CM | POA: Diagnosis not present

## 2023-12-28 DIAGNOSIS — Z716 Tobacco abuse counseling: Secondary | ICD-10-CM

## 2023-12-28 DIAGNOSIS — N12 Tubulo-interstitial nephritis, not specified as acute or chronic: Secondary | ICD-10-CM | POA: Diagnosis not present

## 2023-12-28 DIAGNOSIS — Z91148 Patient's other noncompliance with medication regimen for other reason: Secondary | ICD-10-CM

## 2023-12-28 DIAGNOSIS — I130 Hypertensive heart and chronic kidney disease with heart failure and stage 1 through stage 4 chronic kidney disease, or unspecified chronic kidney disease: Secondary | ICD-10-CM | POA: Diagnosis present

## 2023-12-28 DIAGNOSIS — L89313 Pressure ulcer of right buttock, stage 3: Secondary | ICD-10-CM | POA: Diagnosis not present

## 2023-12-28 DIAGNOSIS — B376 Candidal endocarditis: Secondary | ICD-10-CM | POA: Diagnosis present

## 2023-12-28 DIAGNOSIS — Z833 Family history of diabetes mellitus: Secondary | ICD-10-CM

## 2023-12-28 DIAGNOSIS — F419 Anxiety disorder, unspecified: Secondary | ICD-10-CM | POA: Diagnosis present

## 2023-12-28 DIAGNOSIS — Z888 Allergy status to other drugs, medicaments and biological substances status: Secondary | ICD-10-CM

## 2023-12-28 DIAGNOSIS — D631 Anemia in chronic kidney disease: Secondary | ICD-10-CM | POA: Diagnosis present

## 2023-12-28 DIAGNOSIS — R54 Age-related physical debility: Secondary | ICD-10-CM | POA: Diagnosis not present

## 2023-12-28 DIAGNOSIS — I440 Atrioventricular block, first degree: Secondary | ICD-10-CM | POA: Diagnosis present

## 2023-12-28 DIAGNOSIS — N170 Acute kidney failure with tubular necrosis: Secondary | ICD-10-CM | POA: Diagnosis not present

## 2023-12-28 DIAGNOSIS — E875 Hyperkalemia: Secondary | ICD-10-CM | POA: Diagnosis not present

## 2023-12-28 DIAGNOSIS — Z8249 Family history of ischemic heart disease and other diseases of the circulatory system: Secondary | ICD-10-CM

## 2023-12-28 DIAGNOSIS — J90 Pleural effusion, not elsewhere classified: Secondary | ICD-10-CM | POA: Diagnosis not present

## 2023-12-28 DIAGNOSIS — T8111XA Postprocedural  cardiogenic shock, initial encounter: Secondary | ICD-10-CM | POA: Diagnosis not present

## 2023-12-28 DIAGNOSIS — R040 Epistaxis: Secondary | ICD-10-CM

## 2023-12-28 DIAGNOSIS — R7401 Elevation of levels of liver transaminase levels: Secondary | ICD-10-CM | POA: Insufficient documentation

## 2023-12-28 DIAGNOSIS — N1832 Chronic kidney disease, stage 3b: Secondary | ICD-10-CM | POA: Diagnosis present

## 2023-12-28 DIAGNOSIS — Z79899 Other long term (current) drug therapy: Secondary | ICD-10-CM

## 2023-12-28 DIAGNOSIS — Z885 Allergy status to narcotic agent status: Secondary | ICD-10-CM

## 2023-12-28 DIAGNOSIS — F4321 Adjustment disorder with depressed mood: Secondary | ICD-10-CM | POA: Diagnosis not present

## 2023-12-28 DIAGNOSIS — Z9281 Personal history of extracorporeal membrane oxygenation (ECMO): Secondary | ICD-10-CM

## 2023-12-28 DIAGNOSIS — Z9882 Breast implant status: Secondary | ICD-10-CM

## 2023-12-28 DIAGNOSIS — B377 Candidal sepsis: Secondary | ICD-10-CM | POA: Diagnosis not present

## 2023-12-28 DIAGNOSIS — Z90710 Acquired absence of both cervix and uterus: Secondary | ICD-10-CM

## 2023-12-28 DIAGNOSIS — Z9889 Other specified postprocedural states: Secondary | ICD-10-CM

## 2023-12-28 DIAGNOSIS — J9811 Atelectasis: Secondary | ICD-10-CM | POA: Diagnosis not present

## 2023-12-28 DIAGNOSIS — B488 Other specified mycoses: Secondary | ICD-10-CM | POA: Diagnosis present

## 2023-12-28 DIAGNOSIS — R748 Abnormal levels of other serum enzymes: Secondary | ICD-10-CM | POA: Diagnosis present

## 2023-12-28 DIAGNOSIS — G9349 Other encephalopathy: Secondary | ICD-10-CM | POA: Diagnosis not present

## 2023-12-28 DIAGNOSIS — I4819 Other persistent atrial fibrillation: Secondary | ICD-10-CM | POA: Diagnosis present

## 2023-12-28 DIAGNOSIS — D6959 Other secondary thrombocytopenia: Secondary | ICD-10-CM | POA: Diagnosis not present

## 2023-12-28 DIAGNOSIS — K9049 Malabsorption due to intolerance, not elsewhere classified: Secondary | ICD-10-CM | POA: Diagnosis not present

## 2023-12-28 DIAGNOSIS — E162 Hypoglycemia, unspecified: Secondary | ICD-10-CM | POA: Diagnosis not present

## 2023-12-28 DIAGNOSIS — E876 Hypokalemia: Secondary | ICD-10-CM | POA: Diagnosis present

## 2023-12-28 DIAGNOSIS — R5383 Other fatigue: Secondary | ICD-10-CM | POA: Diagnosis not present

## 2023-12-28 DIAGNOSIS — I059 Rheumatic mitral valve disease, unspecified: Secondary | ICD-10-CM

## 2023-12-28 DIAGNOSIS — I50810 Right heart failure, unspecified: Secondary | ICD-10-CM | POA: Diagnosis present

## 2023-12-28 DIAGNOSIS — Z7901 Long term (current) use of anticoagulants: Secondary | ICD-10-CM

## 2023-12-28 DIAGNOSIS — J690 Pneumonitis due to inhalation of food and vomit: Secondary | ICD-10-CM | POA: Diagnosis not present

## 2023-12-28 DIAGNOSIS — J9601 Acute respiratory failure with hypoxia: Secondary | ICD-10-CM

## 2023-12-28 DIAGNOSIS — Z6823 Body mass index (BMI) 23.0-23.9, adult: Secondary | ICD-10-CM

## 2023-12-28 DIAGNOSIS — Z7982 Long term (current) use of aspirin: Secondary | ICD-10-CM

## 2023-12-28 DIAGNOSIS — K029 Dental caries, unspecified: Secondary | ICD-10-CM | POA: Diagnosis present

## 2023-12-28 DIAGNOSIS — K056 Periodontal disease, unspecified: Secondary | ICD-10-CM | POA: Diagnosis present

## 2023-12-28 DIAGNOSIS — D62 Acute posthemorrhagic anemia: Secondary | ICD-10-CM | POA: Diagnosis not present

## 2023-12-28 DIAGNOSIS — K567 Ileus, unspecified: Secondary | ICD-10-CM | POA: Diagnosis not present

## 2023-12-28 DIAGNOSIS — N200 Calculus of kidney: Secondary | ICD-10-CM | POA: Diagnosis present

## 2023-12-28 DIAGNOSIS — T17890A Other foreign object in other parts of respiratory tract causing asphyxiation, initial encounter: Secondary | ICD-10-CM | POA: Diagnosis not present

## 2023-12-28 DIAGNOSIS — I340 Nonrheumatic mitral (valve) insufficiency: Secondary | ICD-10-CM | POA: Diagnosis present

## 2023-12-28 DIAGNOSIS — R042 Hemoptysis: Secondary | ICD-10-CM | POA: Diagnosis not present

## 2023-12-28 DIAGNOSIS — R34 Anuria and oliguria: Secondary | ICD-10-CM | POA: Diagnosis not present

## 2023-12-28 DIAGNOSIS — I493 Ventricular premature depolarization: Secondary | ICD-10-CM | POA: Diagnosis present

## 2023-12-28 DIAGNOSIS — I97190 Other postprocedural cardiac functional disturbances following cardiac surgery: Secondary | ICD-10-CM | POA: Diagnosis not present

## 2023-12-28 DIAGNOSIS — R0689 Other abnormalities of breathing: Secondary | ICD-10-CM | POA: Diagnosis not present

## 2023-12-28 DIAGNOSIS — I495 Sick sinus syndrome: Secondary | ICD-10-CM | POA: Diagnosis present

## 2023-12-28 DIAGNOSIS — T508X5A Adverse effect of diagnostic agents, initial encounter: Secondary | ICD-10-CM | POA: Diagnosis not present

## 2023-12-28 DIAGNOSIS — I454 Nonspecific intraventricular block: Secondary | ICD-10-CM | POA: Diagnosis not present

## 2023-12-28 DIAGNOSIS — E871 Hypo-osmolality and hyponatremia: Secondary | ICD-10-CM | POA: Diagnosis not present

## 2023-12-28 DIAGNOSIS — Z952 Presence of prosthetic heart valve: Secondary | ICD-10-CM

## 2023-12-28 DIAGNOSIS — B3749 Other urogenital candidiasis: Secondary | ICD-10-CM | POA: Diagnosis not present

## 2023-12-28 DIAGNOSIS — K7200 Acute and subacute hepatic failure without coma: Secondary | ICD-10-CM | POA: Diagnosis present

## 2023-12-28 DIAGNOSIS — R579 Shock, unspecified: Secondary | ICD-10-CM

## 2023-12-28 DIAGNOSIS — Z9911 Dependence on respirator [ventilator] status: Secondary | ICD-10-CM

## 2023-12-28 DIAGNOSIS — Z87442 Personal history of urinary calculi: Secondary | ICD-10-CM

## 2023-12-28 DIAGNOSIS — I330 Acute and subacute infective endocarditis: Secondary | ICD-10-CM | POA: Diagnosis present

## 2023-12-28 DIAGNOSIS — Z6831 Body mass index (BMI) 31.0-31.9, adult: Secondary | ICD-10-CM

## 2023-12-28 DIAGNOSIS — E8809 Other disorders of plasma-protein metabolism, not elsewhere classified: Secondary | ICD-10-CM | POA: Diagnosis present

## 2023-12-28 DIAGNOSIS — F17210 Nicotine dependence, cigarettes, uncomplicated: Secondary | ICD-10-CM | POA: Diagnosis present

## 2023-12-28 DIAGNOSIS — J1569 Pneumonia due to other gram-negative bacteria: Secondary | ICD-10-CM | POA: Diagnosis not present

## 2023-12-28 DIAGNOSIS — Y828 Other medical devices associated with adverse incidents: Secondary | ICD-10-CM | POA: Diagnosis present

## 2023-12-28 HISTORY — DX: Dizziness and giddiness: R42

## 2023-12-28 HISTORY — DX: Anxiety disorder, unspecified: F41.9

## 2023-12-28 HISTORY — DX: Other specified health status: Z78.9

## 2023-12-28 HISTORY — DX: Calculus of kidney: N20.0

## 2023-12-28 LAB — CBC WITH DIFFERENTIAL/PLATELET
Abs Immature Granulocytes: 0.1 K/uL — ABNORMAL HIGH (ref 0.00–0.07)
Basophils Absolute: 0 K/uL (ref 0.0–0.1)
Basophils Relative: 1 %
Eosinophils Absolute: 0.1 K/uL (ref 0.0–0.5)
Eosinophils Relative: 1 %
HCT: 36.5 % (ref 36.0–46.0)
Hemoglobin: 11.9 g/dL — ABNORMAL LOW (ref 12.0–15.0)
Immature Granulocytes: 1 %
Lymphocytes Relative: 24 %
Lymphs Abs: 2.1 K/uL (ref 0.7–4.0)
MCH: 32 pg (ref 26.0–34.0)
MCHC: 32.6 g/dL (ref 30.0–36.0)
MCV: 98.1 fL (ref 80.0–100.0)
Monocytes Absolute: 0.4 K/uL (ref 0.1–1.0)
Monocytes Relative: 5 %
Neutro Abs: 5.9 K/uL (ref 1.7–7.7)
Neutrophils Relative %: 68 %
Platelets: 338 K/uL (ref 150–400)
RBC: 3.72 MIL/uL — ABNORMAL LOW (ref 3.87–5.11)
RDW: 13.1 % (ref 11.5–15.5)
WBC: 8.6 K/uL (ref 4.0–10.5)
nRBC: 0 % (ref 0.0–0.2)

## 2023-12-28 LAB — URINALYSIS, W/ REFLEX TO CULTURE (INFECTION SUSPECTED)
Bacteria, UA: NONE SEEN
Bilirubin Urine: NEGATIVE
Glucose, UA: NEGATIVE mg/dL
Ketones, ur: NEGATIVE mg/dL
Nitrite: NEGATIVE
Protein, ur: NEGATIVE mg/dL
Specific Gravity, Urine: 1.012 (ref 1.005–1.030)
pH: 5 (ref 5.0–8.0)

## 2023-12-28 LAB — I-STAT CHEM 8, ED
BUN: 13 mg/dL (ref 8–23)
Calcium, Ion: 1.03 mmol/L — ABNORMAL LOW (ref 1.15–1.40)
Chloride: 102 mmol/L (ref 98–111)
Creatinine, Ser: 0.6 mg/dL (ref 0.44–1.00)
Glucose, Bld: 99 mg/dL (ref 70–99)
HCT: 50 % — ABNORMAL HIGH (ref 36.0–46.0)
Hemoglobin: 17 g/dL — ABNORMAL HIGH (ref 12.0–15.0)
Potassium: 3.5 mmol/L (ref 3.5–5.1)
Sodium: 138 mmol/L (ref 135–145)
TCO2: 25 mmol/L (ref 22–32)

## 2023-12-28 LAB — COMPREHENSIVE METABOLIC PANEL WITH GFR
ALT: 81 U/L — ABNORMAL HIGH (ref 0–44)
AST: 47 U/L — ABNORMAL HIGH (ref 15–41)
Albumin: 2.6 g/dL — ABNORMAL LOW (ref 3.5–5.0)
Alkaline Phosphatase: 366 U/L — ABNORMAL HIGH (ref 38–126)
Anion gap: 13 (ref 5–15)
BUN: 11 mg/dL (ref 8–23)
CO2: 23 mmol/L (ref 22–32)
Calcium: 8.6 mg/dL — ABNORMAL LOW (ref 8.9–10.3)
Chloride: 100 mmol/L (ref 98–111)
Creatinine, Ser: 0.66 mg/dL (ref 0.44–1.00)
GFR, Estimated: >60 mL/min (ref >60)
Glucose, Bld: 100 mg/dL — ABNORMAL HIGH (ref 70–99)
Potassium: 3.3 mmol/L — ABNORMAL LOW (ref 3.5–5.1)
Sodium: 136 mmol/L (ref 135–145)
Total Bilirubin: 1.1 mg/dL (ref 0.0–1.2)
Total Protein: 7.2 g/dL (ref 6.5–8.1)

## 2023-12-28 NOTE — ED Triage Notes (Signed)
 First Nurse Note: Pt was admitted for 5 days out of town at a hospital in [place], [name] for a fungal infection in her blood stream. They left AMA to come home after they were unable to have her formally transferred. Pt states she needs to continue with IV therapy.

## 2023-12-28 NOTE — ED Provider Triage Note (Signed)
 Emergency Medicine Provider Triage Evaluation Note  Tracy Baker , a 68 y.o. female  was evaluated in triage.  Pt presents to the emergency department with a chief complaint of possible fungal blood infection.  Patient recently left AMA from a hospital in [place]  and came straight here for evaluation.  At the hospital in [place]  patient was noted to have pneumonia, fungal blood infection, pyelonephritis, as well as other findings.  Patient's only current complaint is overall lethargy and fatigue.  Denies abdominal pain or flank pain.  Denies urinary symptoms.  Patient states that she ran episodic fevers while in the hospital.  Denies chest pain or shortness of breath.  Review of Systems  Positive: Possible fungal blood infection, cough, lethargy, fatigue, prostatic fever and chills Negative: Chest pain, shortness of breath, abdominal pain, urinary symptoms, nausea, vomiting  Physical Exam  BP (!) 152/64 (BP Location: Right Arm)   Pulse 70   Temp 98.4 F (36.9 C)   Resp 16   Ht 5' 4 (1.626 m)   SpO2 95%   BMI 27.29 kg/m  Gen:   Awake, no distress   Resp:  Normal effort  MSK:   Moves extremities without difficulty  Other:  Abdomen is soft and non-tender  Medical Decision Making  Medically screening exam initiated at 6:41 PM.  Appropriate orders placed.  Tracy Baker was informed that the remainder of the evaluation will be completed by another provider, this initial triage assessment does not replace that evaluation, and the importance of remaining in the ED until their evaluation is complete.  Orders: CBC, CMP, i-STAT lactic acid, urinalysis, blood cultures x 2, chest x-ray   Tracy Baker, [place] 12/28/23 1845

## 2023-12-29 ENCOUNTER — Encounter (HOSPITAL_COMMUNITY): Payer: Self-pay | Admitting: Family Medicine

## 2023-12-29 DIAGNOSIS — N390 Urinary tract infection, site not specified: Secondary | ICD-10-CM

## 2023-12-29 DIAGNOSIS — Z960 Presence of urogenital implants: Secondary | ICD-10-CM

## 2023-12-29 DIAGNOSIS — R7401 Elevation of levels of liver transaminase levels: Secondary | ICD-10-CM | POA: Insufficient documentation

## 2023-12-29 DIAGNOSIS — Z9889 Other specified postprocedural states: Secondary | ICD-10-CM | POA: Diagnosis not present

## 2023-12-29 DIAGNOSIS — R748 Abnormal levels of other serum enzymes: Secondary | ICD-10-CM

## 2023-12-29 DIAGNOSIS — B377 Candidal sepsis: Secondary | ICD-10-CM | POA: Diagnosis not present

## 2023-12-29 DIAGNOSIS — E876 Hypokalemia: Secondary | ICD-10-CM | POA: Insufficient documentation

## 2023-12-29 MED ORDER — ENOXAPARIN SODIUM 40 MG/0.4ML IJ SOSY
40.0000 mg | PREFILLED_SYRINGE | INTRAMUSCULAR | Status: DC
  Administered 2023-12-29 – 2023-12-30 (×2): 40 mg via SUBCUTANEOUS
  Filled 2023-12-29 (×3): qty 0.4

## 2023-12-29 MED ORDER — POTASSIUM CHLORIDE CRYS ER 20 MEQ PO TBCR
40.0000 meq | EXTENDED_RELEASE_TABLET | Freq: Once | ORAL | Status: DC
Start: 2023-12-29 — End: 2023-12-29

## 2023-12-29 MED ORDER — CALCIUM GLUCONATE-NACL 1-0.675 GM/50ML-% IV SOLN
1.0000 g | Freq: Once | INTRAVENOUS | Status: AC
Start: 2023-12-29 — End: 2023-12-29
  Administered 2023-12-29: 1000 mg via INTRAVENOUS
  Filled 2023-12-29: qty 50

## 2023-12-29 MED ORDER — ACETAMINOPHEN 650 MG RE SUPP: 650.0000 mg | Freq: Four times a day (QID) | RECTAL | Status: DC | PRN

## 2023-12-29 MED ORDER — ACETAMINOPHEN 325 MG PO TABS
650.0000 mg | ORAL_TABLET | Freq: Four times a day (QID) | ORAL | Status: DC | PRN
  Administered 2023-12-30 – 2024-01-09 (×4): 650 mg via ORAL
  Filled 2023-12-29 (×5): qty 2

## 2023-12-29 MED ORDER — POTASSIUM CHLORIDE 20 MEQ PO PACK
40.0000 meq | PACK | Freq: Once | ORAL | Status: DC
  Filled 2023-12-29: qty 2

## 2023-12-29 MED ORDER — CLONAZEPAM 0.5 MG PO TABS
0.5000 mg | ORAL_TABLET | Freq: Once | ORAL | Status: AC
  Administered 2023-12-29: 0.5 mg via ORAL
  Filled 2023-12-29: qty 1

## 2023-12-29 MED ORDER — MICAFUNGIN SODIUM 100 MG IV SOLR
150.0000 mg | INTRAVENOUS | Status: DC
  Administered 2023-12-29 – 2024-01-09 (×12): 150 mg via INTRAVENOUS
  Filled 2023-12-29 (×13): qty 7.5

## 2023-12-29 MED ORDER — FLUCONAZOLE IN SODIUM CHLORIDE 400-0.9 MG/200ML-% IV SOLN
400.0000 mg | INTRAVENOUS | Status: DC
  Filled 2023-12-29: qty 200

## 2023-12-29 NOTE — Consult Note (Addendum)
 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  Candida albicans is typically sensitive to fluconazole I agree with getting transesophageal echocardiogram and we will reach out to cardiology on Monday and have the patient n.p.o. Sunday night Will also need to reach out to ophthalmology on Monday for formal dilated funduscopic exam.  I do not see an urgent indication for asking for consult on the weekend. Standard universal precautions   Principal Problem:   C. albicans candidemia (HCC) Active Problems:   Hypokalemia   Hypocalcemia   Transaminitis   Scheduled Meds:  enoxaparin (LOVENOX) injection  40 mg Subcutaneous Q24H   potassium chloride  40 mEq Oral Once   Continuous Infusions:  micafungin (MYCAMINE) 150 mg in sodium chloride  0.9 % 100 mL IVPB 150 mg (12/29/23 1132)   PRN Meds:.acetaminophen **OR** acetaminophen  HPI: Tracy Baker is a 68 y.o. female who had been otherwise healthy who then developed kidney stones requiring ureteral stent placement and lithotripsy at [facility] on October 10.  Stent was then removed on the 14th per patient's request because she wanted to attend her grand sons wedding in [place] .  While in [place] she became acutely ill and presented to the ER and was admitted to medical [facility]  CT on admission showed some stranding around the right kidney and  ureter urine cultures and blood cultures were taken and urine cultures yielded rater than 100,000 colony-forming units of budding yeast which has identified been advised to ask as Candida albicans as well as 10-25,000 colony forming is a gram-negative rods.  Blood cultures from the same date were also positive for Candida albicans  Patient was placed on fluconazole.  Blood cultures on the 20th also which still growing Candida albicans.  Patient was seen by infectious disease in [place]. She had repeat blood cultures taken on the 22nd which are pending.  LFTs began risingwith AST, ALT and alkaline phosophatase.   There was consideration of switching her to micafungin.  I do not believe that sensitivities are back on the Candida albicans though it is typically sensitive to fluconazole  TTE negative for endocarditis at [facility].  Patient very much wanted to get back to [facility] and apparently efforts were made to get in touch with me yesterday though I never received phone call nor did my clinic until later in the day. Finally my staff were called and connected me to the  hospitalist at [facility]   I confirmed that  [facility] had the capacity to do a TEE and for ophthalmology consultation to rule out fungal endophthalmitis.   I counseled that they get in touch with [facility] who could facilitate transfer within their hospital to [facility].  I do not know if  efforts were made to transfer the patient or not but regardless she decided to leave and drive to [facility] and came to the emergency department and now been admitted to the hospitalist  service.  Repeat blood cultures have been taken here.  She was started on fluconazole.   She has no ocular symptoms and no pain at present is hemodynamically stable.  Her persistently positive blood cultures I think a TEE is warranted to rule out fungal endocarditis.  The cultures were only taken on the 19th and 20th so she really only had about 24 hours at most  between the blood cultures that were taken we have repeat cultures on the 22nd and now also here at [facility].  I still think however that we should get a TEE.  While she has no visual symptoms she also needs to have an funduscopic dilated eye exam by ophthalmology.  I am going to switch her over to micafungin for multiple reasons.  First of all if she really has endocarditis this is the drug of choice.  Secondly if she is having transaminitis due to fluconazole she should be switched  Thirdly but much less likely C albicans could be resistant to fluconazole.  I will order RUQ if not yet done. Dr. Jonel noted that patient has had AST/ALT fluctuations and chrnically elevated Alk phosphatase but will make above maneuvers to be thorough.  I have made attempts to reach the microbiology lab at [facility] but have been unsuccessful so far  I personally spent a total of 84 minutes in the care of the patient today including preparing to see the patient, getting/reviewing separately obtained history, performing a medically appropriate exam/evaluation, counseling and educating, placing orders, referring and communicating with other health care professionals, documenting clinical information in the EHR, independently interpreting results, communicating results, and attempting to get data on c albicans from [facility].   Evaluation of the patient requires complex antimicrobial therapy evaluation, counseling , isolation needs to reduce disease transmission and risk assessment and mitigation.    Review of Systems: Review of Systems  Constitutional:  Positive for fever. Negative for chills, malaise/fatigue and weight loss.  HENT:  Negative for congestion and sore throat.   Eyes:  Negative for blurred vision and photophobia.  Respiratory:  Negative for cough, shortness of breath and wheezing.   Cardiovascular:  Negative for chest pain, palpitations and leg swelling.  Gastrointestinal:  Negative for abdominal pain, blood  in stool, constipation, diarrhea, heartburn, melena, nausea and vomiting.  Genitourinary:  Positive for dysuria and flank pain. Negative for hematuria.  Musculoskeletal:  Negative for back pain, falls, joint pain and myalgias.  Skin:  Negative for itching and rash.  Neurological:  Negative for dizziness, focal weakness, loss of consciousness, weakness and headaches.  Endo/Heme/Allergies:  Does not bruise/bleed easily.  Psychiatric/Behavioral:  Negative for depression and suicidal ideas. The patient does not have insomnia.     Past Medical History:  Diagnosis Date   Thyroid  disease     Social History   Tobacco Use   Smoking status: Every Day    Current packs/day: 1.00    Types: Cigarettes   Smokeless tobacco: Never   Tobacco comments:    About 15-20 cigarettes a day  Substance Use Topics   Alcohol use: Yes   Drug use: Never    Family History  Problem Relation Age of Onset   Hypertension Mother    Diabetes Father    Breast cancer Neg Hx    Allergies  Allergen Reactions   Codeine Anaphylaxis and Nausea And Vomiting   Red Dye #40 (Allura Red) Nausea Only    OBJECTIVE: Blood pressure 136/73, pulse 65, temperature 98.5 F (36.9 C), temperature  source Oral, resp. rate 17, height 5' 4 (1.626 m), weight 69.9 kg, SpO2 93%.  Physical Exam Constitutional:      General: She is not in acute distress.    Appearance: Normal appearance. She is well-developed. She is not ill-appearing or diaphoretic.  HENT:     Head: Normocephalic and atraumatic.     Right Ear: Hearing and external ear normal.     Left Ear: Hearing and external ear normal.     Nose: No nasal deformity or rhinorrhea.  Eyes:     General: No scleral icterus.    Conjunctiva/sclera: Conjunctivae normal.     Right eye: Right conjunctiva is not injected.     Left eye: Left conjunctiva is not injected.     Pupils: Pupils are equal, round, and reactive to light.  Neck:     Vascular: No JVD.  Cardiovascular:      Rate and Rhythm: Normal rate and regular rhythm.     Heart sounds: Normal heart sounds, S1 normal and S2 normal. No murmur heard.    No friction rub. No gallop.  Pulmonary:     Effort: Pulmonary effort is normal. No respiratory distress.     Breath sounds: No stridor. No wheezing.  Abdominal:     General: Bowel sounds are normal. There is no distension.     Palpations: Abdomen is soft.     Tenderness: There is no abdominal tenderness.     Hernia: No hernia is present.  Musculoskeletal:        General: Normal range of motion.     Right shoulder: Normal.     Left shoulder: Normal.     Cervical back: Normal range of motion and neck supple.     Right hip: Normal.     Left hip: Normal.     Right knee: Normal.     Left knee: Normal.  Lymphadenopathy:     Head:     Right side of head: No submandibular, preauricular or posterior auricular adenopathy.     Left side of head: No submandibular, preauricular or posterior auricular adenopathy.     Cervical: No cervical adenopathy.     Right cervical: No superficial or deep cervical adenopathy.    Left cervical: No superficial or deep cervical adenopathy.  Skin:    General: Skin is warm and dry.     Coloration: Skin is not pale.     Findings: No abrasion, bruising, ecchymosis, erythema, lesion or rash.     Nails: There is no clubbing.  Neurological:     General: No focal deficit present.     Mental Status: She is alert and oriented to person, place, and time.     Sensory: No sensory deficit.     Gait: Gait normal.  Psychiatric:        Attention and Perception: She is attentive.        Mood and Affect: Mood normal.        Speech: Speech normal.        Behavior: Behavior normal. Behavior is cooperative.        Thought Content: Thought content normal.        Judgment: Judgment normal.     Lab Results Lab Results  Component Value Date   WBC 8.6 12/28/2023   HGB 17.0 (H) 12/28/2023   HCT 50.0 (H) 12/28/2023   MCV 98.1 12/28/2023    PLT 338 12/28/2023    Lab Results  Component Value Date   CREATININE 0.60  12/28/2023   BUN 13 12/28/2023   NA 138 12/28/2023   K 3.5 12/28/2023   CL 102 12/28/2023   CO2 23 12/28/2023    Lab Results  Component Value Date   ALT 81 (H) 12/28/2023   AST 47 (H) 12/28/2023   ALKPHOS 366 (H) 12/28/2023   BILITOT 1.1 12/28/2023     Microbiology: Recent Results (from the past 240 hours)  Blood culture (routine x 2)     Status: None (Preliminary result)   Collection Time: 12/28/23  6:57 PM   Specimen: BLOOD  Result Value Ref Range Status   Specimen Description BLOOD RIGHT ANTECUBITAL  Final   Special Requests   Final    BOTTLES DRAWN AEROBIC AND ANAEROBIC Blood Culture adequate volume   Culture   Final    NO GROWTH < 12 HOURS Performed at [facility], [address], [place], [place] [address]    Report Status PENDING  Incomplete  Blood culture (routine x 2)     Status: None (Preliminary result)   Collection Time: 12/28/23  7:06 PM   Specimen: BLOOD RIGHT HAND  Result Value Ref Range Status   Specimen Description BLOOD RIGHT HAND  Final   Special Requests   Final    BOTTLES DRAWN AEROBIC AND ANAEROBIC Blood Culture adequate volume   Culture   Final    NO GROWTH < 12 HOURS Performed at [facility], [address] [place], [place] [address]    Report Status PENDING  Incomplete    Jomarie Fleeta Rothman, MD [facility] [phone] pager  12/29/2023, 12:25 PM

## 2023-12-29 NOTE — H&P (Signed)
 History and Physical    Patient: Tracy Baker FMW:[id number] DOB: [birth year] DOA: 12/28/2023 DOS: the patient was seen and examined on 12/29/2023 PCP: Kristina Tinnie POUR, PA-C  Patient coming from: Left AMA from another hospital  Chief Complaint:  Chief Complaint  Patient presents with   Candidemia       HPI:  68 y.o. F with history nephrolithiasis otherwise healthy, presents by private vehicle after leaving [facility] in [facility] where she was being treated for C. albicans candidemia.  First underwent ureteral stent placement and lithotripsy at [facility]: [facility] Rhonda W 10/10, then stent removal 10/14.  After stent removal, she developed fever, chills, malaise.  She went to [place] on vacation, where she got worse and presented to the ER at [facility].  There, CT showed stranding around the right kidney and ureter.  Urology was consulted and she was admitted on Rocephin but urine and blood cultures subsequently turned positive for candida albicans. ID were consulted.   Treated with fluconazole.  TTE normal.    Per d/c summary (results not in careEveywhere that I can see): 10/18 urine culture: Yeast 10/18 blood culture: Candida albicans in 1/2 10/20 blood culture: Budding yeast 10/22 blood culture: results unavailable  On hospital day 7, the patient requested to leave AMA and drive herself to [facility], as she didn't understand the medical plan or plans for discharge, she wished to be close to home, and efforts to transfer directly from [facility] were unsuccessful.  In the ER here, vitals WNL.  Blood cultures obtained and she was given Diflucan and the hospitalist service was asked to evaluate.      Review of Systems  Constitutional:  Positive for malaise/fatigue.  Genitourinary:  Positive for flank pain.  All other systems reviewed and are negative.    Past Medical History:  Diagnosis Date   Thyroid  disease    Past Surgical History:  Procedure Laterality Date   APPENDECTOMY     AUGMENTATION  MAMMAPLASTY Bilateral 1981   removed in 2006   BREAST IMPLANT REMOVAL Bilateral 2007   CHOLECYSTECTOMY  2010   COLONOSCOPY WITH PROPOFOL  N/A 06/30/2021   Procedure: COLONOSCOPY WITH PROPOFOL ;  Surgeon: Jinny Carmine, MD;  Location: [facility] ENDOSCOPY;  Service: Endoscopy;  Laterality: N/A;   LAPAROSCOPIC TOTAL HYSTERECTOMY  2006   Social History:  reports that she has been smoking cigarettes. She has never used smokeless tobacco. She reports current alcohol use. She reports that she does not use drugs.  Allergies  Allergen Reactions   Codeine Anaphylaxis and Nausea And Vomiting   Red Dye #40 (Allura Red) Nausea Only    Family History  Problem Relation Age of Onset   Hypertension Mother    Diabetes Father    Breast cancer Neg Hx     Prior to Admission medications   Medication Sig Start Date End Date Taking? Authorizing Provider  clonazePAM  (KLONOPIN ) 0.5 MG tablet Take 0.25-0.5 mg by mouth at bedtime as needed for anxiety. 06/07/21  Yes [provider]  fluconazole (DIFLUCAN) 200 MG tablet Take 2 tablets by mouth daily for 3 days. Please present to hospital in [place]  for continued treatment. In the meantime, take 2 tablets by mouth daily for 3 days. 12/28/23 12/31/23 Yes [provider]  ondansetron  (ZOFRAN ) 4 MG tablet Take 1 tablet (4 mg total) by mouth every 8 (eight) hours as needed for nausea or vomiting. 11/08/23  Yes McDonough, Lauren K, PA-C  pantoprazole  (PROTONIX ) 20 MG tablet Take 1 tablet (20 mg total) by mouth daily. 11/08/23  Yes McDonough, Lauren K, PA-C  lovastatin  (MEVACOR ) 20 MG tablet Take 1 tablet (20 mg total) by mouth at bedtime. Patient not taking: Reported on 12/29/2023 07/09/23   Kristina Tinnie POUR, PA-C    Physical Exam: Vitals:   12/28/23 2358 12/29/23 0300 12/29/23 0315 12/29/23 0400  BP: (!) 129/55 (!) 144/70 (!) 157/69   Pulse: 66 63 (!) 57   Resp: 18  18   Temp: 98.2 F (36.8 C)     SpO2: 96% 97% 90%   Weight:    69.9 kg  Height:        General appearance:   alert and in no distress.  Responds appropriately to questions.  Eye contact appropriate. HEENT:  Anicteric, conjunctivae and sclerae normal without injection or icterus, lids and lashes normal.  Visual tracking smooth.  OP moist without lesions, normal auditory acuity   Cor:  RRR, without murmurs, rubs.  JVP normal, no LE edema Resp:  Normal respiratory rate and rhythm.  CTAB without rales or wheezes. Abd:  No TTP or rebound all quadrants.  No masses or organomegaly.   No ascites, distension.  Neuro:  Speech is fluent.  Naming is grossly intact, patient's recall, both recent and remote, seem within normal limits.  Muscle tone normal, face symmetric  Psych:  Attention span and concentration are within normal limits.  Affect normal.  appropriate thought content and normal rate of speech, thought process linear           Data Reviewed: BMP shows mild hypokalemia, hypocalcemia Normal renal function LFTs slightly up but stable CBC shows elevated HGB, no leukocytosis Outside records reviewed and summarized above CXR report reviewed, no infiltrates     Assessment and Plan: * C. albicans candidemia (HCC) Treatment already initiated in [place].  Repeat cultures on 10/20 still had candida present.  Repeat from 10/22 not available.  TTE reportedly negative. - Obtain repeat cultures - IV fluconazole - Consult ID tomorrow - Defer TEE to ID    Transaminitis Alk phos seems chronically elevated and ALT has been up in the past.  AST/ALT up slightly at the time of admission, so doubt fluconazole.  Presumably EtOH or MASLD. - Trend LFTs - Follow up with PCP  Hypocalcemia - Supp Ca  Hypokalemia - Supp K         Advance Care Planning: FULL  Consults: None overnight, will need AM consult to ID  Family Communication:  None  Severity of Illness: The appropriate patient status for this patient is INPATIENT. Inpatient status is judged to be reasonable  and necessary in order to provide the required intensity of service to ensure the patient's safety. The patient's presenting symptoms, physical exam findings, and initial radiographic and laboratory data in the context of their chronic comorbidities is felt to place them at high risk for further clinical deterioration. Furthermore, it is not anticipated that the patient will be medically stable for discharge from the hospital within 2 midnights of admission.   * I certify that at the point of admission it is my clinical judgment that the patient will require inpatient hospital care spanning beyond 2 midnights from the point of admission due to high intensity of service, high risk for further deterioration and high frequency of surveillance required.*  Author: Lonni SHAUNNA Dalton, MD 12/29/2023 4:52 AM  For on call review [email or web address].

## 2023-12-29 NOTE — Progress Notes (Signed)
 Courtesy visit- No billing.  Patient is seen and examined today morning.  She is admitted to the hospitalist service for evaluation of candidemia.  Patient was at [facility], [place] left AMA to return to ED for C albicans candidemia.  Patient is evaluated by ID physician, antifungal switched to micafungin.  ID advised transesophageal echocardiogram and ophthalmology evaluation.  Continue to supplement electrolytes, trend LFTs, follow culture results.  Further management as per clinical course.

## 2023-12-29 NOTE — Assessment & Plan Note (Signed)
 -  Supp K

## 2023-12-29 NOTE — Progress Notes (Addendum)
 Pharmacy Antibiotic Note  Tracy Baker is a 68 y.o. female admitted on 12/28/2023 with candidemia. Pharmacy has been consulted for fluconazole dosing. Patient left AMA on 10/24 from [facility] where she was found to have candida albicans in 1/2 blood cultures and >100k budding yeasts in urine on 10/20. PMH also includes recent kidney stone s/p lithotripsy 10/10 and stent removal 10/14 at [facility] and recent CTA showing R-sided pyelonephritis.   -Last dose of oral fluconazole taken 10/24 AM; also on zosyn at outside hospital starting 10/19 > descalated to CRO/azithro for PNA -Started on PO fluconazole 10/19-10/20 > switched to IV fluconazole 10/21-10/22> back to Po fluconazole on 10/23 -repeat blood cultures ordered  -TTE showed no evidence of vegetation -WBC WNL, afebrile   Plan: -Start fluconazole 400mg  IV daily  -Monitor renal function -Will need to make sure cultures clear can switch back to PO therapy -Monitor LFTs given slightly elevated and fluconazole use -Follow up signs of clinical improvement, LOT (typically 2 weeks given clearance of Bcx), de-escalation of antibiotics  -F/u ID consult   Height: 5' 4 (162.6 cm) IBW/kg (Calculated) : 54.7  Temp (24hrs), Avg:98.2 F (36.8 C), Min:98.1 F (36.7 C), Max:98.4 F (36.9 C)  Recent Labs  Lab 12/28/23 1831 12/28/23 1950  WBC 8.6  --   CREATININE 0.66 0.60    CrCl cannot be calculated (Unknown ideal weight.).    Allergies  Allergen Reactions   Codeine Nausea And Vomiting, Anaphylaxis and Other (See Comments)   Red Dye #40 (Allura Red) Nausea Only    Antimicrobials this admission: Zosyn 10/19>10/20 CRO 10/20 >>10/22 Azithro 10/22 >> 10/24 Fluconazole 10/19 >>   Microbiology results: 10/18 Bcx: 1/2 Candida albicans  10/20 BCx: 1/2 budding yeasts  10/22 Bcx: NGTD 10/24 Bcx: drawn at [facility] 10/25 Bcx: ordered 10/18 UCx: budding yeasts   Thank you for allowing pharmacy to be a part of this patient's care.  Lynwood Poplar,  PharmD, BCPS Clinical Pharmacist 12/29/2023 4:19 AM

## 2023-12-29 NOTE — ED Provider Notes (Signed)
 [place] EMERGENCY DEPARTMENT AT [facility] Provider Note   CSN: [id number] Arrival date & time: 12/28/23  1639     Patient presents with: Candidemia   Tracy Baker is a 68 y.o. female.   HPI Patient with complicated history.  Patient with recent lithotripsy earlier this month, and stent removal around October 14.  Patient was then seen in outside hospital in [place] and found to have candidemia as well as Candida in her urine cultures Patient wanted to be transferred back to [place] , with but they were unable to accommodate her request.  Therefore they drove back to [place]. She has no fevers or vomiting.  No new flank or abdominal pain Patient is now on oral Diflucan, next dose is due later this morning Prior to Admission medications   Medication Sig Start Date End Date Taking? Authorizing Provider  ciprofloxacin  (CIPRO ) 500 MG tablet Take 1 tablet (500 mg total) by mouth 2 (two) times daily. 07/09/23   McDonough, Lauren K, PA-C  clonazePAM  (KLONOPIN ) 0.5 MG tablet Take 1/2 to 1 tablet by mouth daily as needed for anxiety 06/07/21   McDonough, Lauren K, PA-C  lovastatin  (MEVACOR ) 20 MG tablet Take 1 tablet (20 mg total) by mouth at bedtime. 07/09/23   McDonough, Lauren K, PA-C  meclizine  (ANTIVERT ) 12.5 MG tablet Take 1 tablet (12.5 mg total) by mouth 3 (three) times daily as needed for dizziness. 07/11/23   McDonough, Lauren K, PA-C  ondansetron  (ZOFRAN ) 4 MG tablet Take 1 tablet (4 mg total) by mouth every 8 (eight) hours as needed for nausea or vomiting. 11/08/23   McDonough, Tinnie POUR, PA-C  pantoprazole  (PROTONIX ) 20 MG tablet Take 1 tablet (20 mg total) by mouth daily. 11/08/23   McDonough, Tinnie POUR, PA-C    Allergies: Codeine and Red dye #40 (allura red)    Review of Systems  Constitutional:  Negative for fever.  Gastrointestinal:  Negative for vomiting.    Updated Vital Signs BP (!) 157/69   Pulse (!) 57   Temp 98.2 F (36.8 C)   Resp 18   Ht 1.626  m (5' 4)   Wt 69.9 kg Comment: Wt from 12/22/2023  SpO2 90%   BMI 26.45 kg/m   Physical Exam CONSTITUTIONAL elderly, no acute distress HEAD: Normocephalic/atraumatic ENMT: Mucous membranes moist NECK: supple no meningeal signs CV: S1/S2 noted, no murmurs/rubs/gallops noted LUNGS: Lungs are clear to auscultation bilaterally, no apparent distress ABDOMEN: soft, nontender, no rebound or guarding, bowel sounds noted throughout abdomen GU:no cva tenderness NEURO: Pt is awake/alert/appropriate, moves all extremitiesx4.  No facial droop.   EXTREMITIES: pulses normal/equal, full ROM SKIN: warm, color normal  (all labs ordered are listed, but only abnormal results are displayed) Labs Reviewed  CBC WITH DIFFERENTIAL/PLATELET - Abnormal; Notable for the following components:      Result Value   RBC 3.72 (*)    Hemoglobin 11.9 (*)    Abs Immature Granulocytes 0.10 (*)    All other components within normal limits  COMPREHENSIVE METABOLIC PANEL WITH GFR - Abnormal; Notable for the following components:   Potassium 3.3 (*)    Glucose, Bld 100 (*)    Calcium 8.6 (*)    Albumin 2.6 (*)    AST 47 (*)    ALT 81 (*)    Alkaline Phosphatase 366 (*)    All other components within normal limits  URINALYSIS, W/ REFLEX TO CULTURE (INFECTION SUSPECTED) - Abnormal; Notable for the following components:   APPearance HAZY (*)  Hgb urine dipstick MODERATE (*)    Leukocytes,Ua TRACE (*)    All other components within normal limits  I-STAT CHEM 8, ED - Abnormal; Notable for the following components:   Calcium, Ion 1.03 (*)    Hemoglobin 17.0 (*)    HCT 50.0 (*)    All other components within normal limits  CULTURE, BLOOD (ROUTINE X 2)  CULTURE, BLOOD (ROUTINE X 2)  URINE CULTURE  I-STAT CG4 LACTIC ACID, ED    EKG: None  Radiology: DG Chest 2 View Result Date: 12/28/2023 EXAM: 2 VIEW(S) XRAY OF THE CHEST 12/28/2023 07:21:00 PM COMPARISON: None available. CLINICAL HISTORY: shortness of  breath, cough, previously diagnosed pneumonia. Per note: Pt presents to the emergency department with a chief complaint of possible fungal blood infection. Patient recently left AMA from a hospital in [place]  and came straight here for evaluation. At the hospital in [place]  patient was noted to have pneumonia, fungal blood infection, pyelonephritis, as well as other findings. Patient's only current complaint is overall lethargy and fatigue. Denies abdominal pain or flank pain. Denies urinary symptoms. Patient states that she ran episodic fevers while in the hospital. Denies chest pain or shortness of breath. FINDINGS: LUNGS AND PLEURA: Streaky bibasilar opacities, favor atelectasis/scarring. No pulmonary edema. No pleural effusion. No pneumothorax. HEART AND MEDIASTINUM: No acute abnormality of the cardiac and mediastinal silhouettes. BONES AND SOFT TISSUES: No acute osseous abnormality. IMPRESSION: 1. No acute findings. Electronically signed by: Pinkie Pebbles MD 12/28/2023 07:30 PM EDT RP Workstation: [id number]     Procedures   Medications Ordered in the ED  fluconazole (DIFLUCAN) IVPB 400 mg (has no administration in time range)  clonazePAM  (KLONOPIN ) tablet 0.5 mg (0.5 mg Oral Given 12/29/23 0323)    Clinical Course as of 12/29/23 0431  Sat Dec 29, 2023  0430 Discussed with Dr. Jonel for admission.  Will continue IV fluconazole. It appears her pneumonia has resolved per x-ray Patient is overall well-appearing, no acute distress [DW]    Clinical Course User Index [DW] Tracy Golas, MD                                 Medical Decision Making Amount and/or Complexity of Data Reviewed Labs: ordered.  Risk Prescription drug management. Decision regarding hospitalization.   Patient with recent admission and found to have candidemia.  She required further hospital stay, but elected to be discharged and come back to [place]  She was also found to have pneumonia  which has improved. Overall patient is well-appearing.  Labs were reviewed which reveal anemia and transaminitis.  Chest x-ray is been reviewed and is unremarkable. I have consulted pharmacy who will order her Diflucan IV. Will admit to the medical service, with infectious disease consultation later today     Final diagnoses:  Candidemia [facility])    ED Discharge Orders     None          Tracy Golas, MD 12/29/23 [phone]

## 2023-12-29 NOTE — Hospital Course (Addendum)
 68 y.o. F with history nephrolithiasis otherwise healthy, presents by private vehicle after leaving [facility] in [facility] where she was being treated for C. albicans candidemia.  First underwent ureteral stent placement and lithotripsy at [facility] 10/10, then stent removal 10/14.  After stent removal, she developed fever, chills, malaise.  She went to [place] on vacation, where she got worse and presented to the ER at [facility].  There, CT showed stranding around the right kidney and ureter.  Urology was consulted and she was admitted on Rocephin but urine and blood cultures subsequently turned positive for candida albicans. ID were consulted.   Treated with fluconazole.  TTE normal.    Per d/c summary (results not in careEveywhere that I can see): 10/18 urine culture: Yeast 10/18 blood culture: Candida albicans in 1/2 10/20 blood culture: Budding yeast 10/22 blood culture: results unavailable  On hospital day 7, the patient requested to leave AMA and drive herself to [facility], as she didn't understand the medical plan or plans for discharge, she wished to be close to home, and efforts to transfer directly from [facility] were unsuccessful.  In the ER here, vitals WNL.  Blood cultures obtained and she was given Diflucan and the hospitalist service was asked to evaluate.

## 2023-12-29 NOTE — Assessment & Plan Note (Addendum)
 Treatment already initiated in [place].  Repeat cultures on 10/20 still had candida present.  Repeat from 10/22 not available.  TTE reportedly negative. - Obtain repeat cultures - IV fluconazole - Consult ID tomorrow - Defer TEE to ID

## 2023-12-29 NOTE — H&P (View-Only) (Signed)
 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  Candida albicans is typically sensitive to fluconazole I agree with getting transesophageal echocardiogram and we will reach out to cardiology on Monday and have the patient n.p.o. Sunday night Will also need to reach out to ophthalmology on Monday for formal dilated funduscopic exam.  I do not see an urgent indication for asking for consult on the weekend. Standard universal precautions   Principal Problem:   C. albicans candidemia (HCC) Active Problems:   Hypokalemia   Hypocalcemia   Transaminitis   Scheduled Meds:  enoxaparin (LOVENOX) injection  40 mg Subcutaneous Q24H   potassium chloride  40 mEq Oral Once   Continuous Infusions:  micafungin (MYCAMINE) 150 mg in sodium chloride  0.9 % 100 mL IVPB 150 mg (12/29/23 1132)   PRN Meds:.acetaminophen **OR** acetaminophen  HPI: Tracy Baker is a 68 y.o. female who had been otherwise healthy who then developed kidney stones requiring ureteral stent placement and lithotripsy at [facility] on October 10.  Stent was then removed on the 14th per patient's request because she wanted to attend her grand sons wedding in [place] .  While in [place] she became acutely ill and presented to the ER and was admitted to medical [facility]  CT on admission showed some stranding around the right kidney and  ureter urine cultures and blood cultures were taken and urine cultures yielded rater than 100,000 colony-forming units of budding yeast which has identified been advised to ask as Candida albicans as well as 10-25,000 colony forming is a gram-negative rods.  Blood cultures from the same date were also positive for Candida albicans  Patient was placed on fluconazole.  Blood cultures on the 20th also which still growing Candida albicans.  Patient was seen by infectious disease in [place]. She had repeat blood cultures taken on the 22nd which are pending.  LFTs began risingwith AST, ALT and alkaline phosophatase.   There was consideration of switching her to micafungin.  I do not believe that sensitivities are back on the Candida albicans though it is typically sensitive to fluconazole  TTE negative for endocarditis at [facility].  Patient very much wanted to get back to [facility] and apparently efforts were made to get in touch with me yesterday though I never received phone call nor did my clinic until later in the day. Finally my staff were called and connected me to the  hospitalist at [facility]   I confirmed that  [facility] had the capacity to do a TEE and for ophthalmology consultation to rule out fungal endophthalmitis.   I counseled that they get in touch with [facility] who could facilitate transfer within their hospital to [facility].  I do not know if  efforts were made to transfer the patient or not but regardless she decided to leave and drive to [facility] and came to the emergency department and now been admitted to the hospitalist  service.  Repeat blood cultures have been taken here.  She was started on fluconazole.   She has no ocular symptoms and no pain at present is hemodynamically stable.  Her persistently positive blood cultures I think a TEE is warranted to rule out fungal endocarditis.  The cultures were only taken on the 19th and 20th so she really only had about 24 hours at most  between the blood cultures that were taken we have repeat cultures on the 22nd and now also here at [facility].  I still think however that we should get a TEE.  While she has no visual symptoms she also needs to have an funduscopic dilated eye exam by ophthalmology.  I am going to switch her over to micafungin for multiple reasons.  First of all if she really has endocarditis this is the drug of choice.  Secondly if she is having transaminitis due to fluconazole she should be switched  Thirdly but much less likely C albicans could be resistant to fluconazole.  I will order RUQ if not yet done. Dr. Jonel noted that patient has had AST/ALT fluctuations and chrnically elevated Alk phosphatase but will make above maneuvers to be thorough.  I have made attempts to reach the microbiology lab at [facility] but have been unsuccessful so far  I personally spent a total of 84 minutes in the care of the patient today including preparing to see the patient, getting/reviewing separately obtained history, performing a medically appropriate exam/evaluation, counseling and educating, placing orders, referring and communicating with other health care professionals, documenting clinical information in the EHR, independently interpreting results, communicating results, and attempting to get data on c albicans from [facility].   Evaluation of the patient requires complex antimicrobial therapy evaluation, counseling , isolation needs to reduce disease transmission and risk assessment and mitigation.    Review of Systems: Review of Systems  Constitutional:  Positive for fever. Negative for chills, malaise/fatigue and weight loss.  HENT:  Negative for congestion and sore throat.   Eyes:  Negative for blurred vision and photophobia.  Respiratory:  Negative for cough, shortness of breath and wheezing.   Cardiovascular:  Negative for chest pain, palpitations and leg swelling.  Gastrointestinal:  Negative for abdominal pain, blood  in stool, constipation, diarrhea, heartburn, melena, nausea and vomiting.  Genitourinary:  Positive for dysuria and flank pain. Negative for hematuria.  Musculoskeletal:  Negative for back pain, falls, joint pain and myalgias.  Skin:  Negative for itching and rash.  Neurological:  Negative for dizziness, focal weakness, loss of consciousness, weakness and headaches.  Endo/Heme/Allergies:  Does not bruise/bleed easily.  Psychiatric/Behavioral:  Negative for depression and suicidal ideas. The patient does not have insomnia.     Past Medical History:  Diagnosis Date   Thyroid  disease     Social History   Tobacco Use   Smoking status: Every Day    Current packs/day: 1.00    Types: Cigarettes   Smokeless tobacco: Never   Tobacco comments:    About 15-20 cigarettes a day  Substance Use Topics   Alcohol use: Yes   Drug use: Never    Family History  Problem Relation Age of Onset   Hypertension Mother    Diabetes Father    Breast cancer Neg Hx    Allergies  Allergen Reactions   Codeine Anaphylaxis and Nausea And Vomiting   Red Dye #40 (Allura Red) Nausea Only    OBJECTIVE: Blood pressure 136/73, pulse 65, temperature 98.5 F (36.9 C), temperature  source Oral, resp. rate 17, height 5' 4 (1.626 m), weight 69.9 kg, SpO2 93%.  Physical Exam Constitutional:      General: She is not in acute distress.    Appearance: Normal appearance. She is well-developed. She is not ill-appearing or diaphoretic.  HENT:     Head: Normocephalic and atraumatic.     Right Ear: Hearing and external ear normal.     Left Ear: Hearing and external ear normal.     Nose: No nasal deformity or rhinorrhea.  Eyes:     General: No scleral icterus.    Conjunctiva/sclera: Conjunctivae normal.     Right eye: Right conjunctiva is not injected.     Left eye: Left conjunctiva is not injected.     Pupils: Pupils are equal, round, and reactive to light.  Neck:     Vascular: No JVD.  Cardiovascular:      Rate and Rhythm: Normal rate and regular rhythm.     Heart sounds: Normal heart sounds, S1 normal and S2 normal. No murmur heard.    No friction rub. No gallop.  Pulmonary:     Effort: Pulmonary effort is normal. No respiratory distress.     Breath sounds: No stridor. No wheezing.  Abdominal:     General: Bowel sounds are normal. There is no distension.     Palpations: Abdomen is soft.     Tenderness: There is no abdominal tenderness.     Hernia: No hernia is present.  Musculoskeletal:        General: Normal range of motion.     Right shoulder: Normal.     Left shoulder: Normal.     Cervical back: Normal range of motion and neck supple.     Right hip: Normal.     Left hip: Normal.     Right knee: Normal.     Left knee: Normal.  Lymphadenopathy:     Head:     Right side of head: No submandibular, preauricular or posterior auricular adenopathy.     Left side of head: No submandibular, preauricular or posterior auricular adenopathy.     Cervical: No cervical adenopathy.     Right cervical: No superficial or deep cervical adenopathy.    Left cervical: No superficial or deep cervical adenopathy.  Skin:    General: Skin is warm and dry.     Coloration: Skin is not pale.     Findings: No abrasion, bruising, ecchymosis, erythema, lesion or rash.     Nails: There is no clubbing.  Neurological:     General: No focal deficit present.     Mental Status: She is alert and oriented to person, place, and time.     Sensory: No sensory deficit.     Gait: Gait normal.  Psychiatric:        Attention and Perception: She is attentive.        Mood and Affect: Mood normal.        Speech: Speech normal.        Behavior: Behavior normal. Behavior is cooperative.        Thought Content: Thought content normal.        Judgment: Judgment normal.     Lab Results Lab Results  Component Value Date   WBC 8.6 12/28/2023   HGB 17.0 (H) 12/28/2023   HCT 50.0 (H) 12/28/2023   MCV 98.1 12/28/2023    PLT 338 12/28/2023    Lab Results  Component Value Date   CREATININE 0.60  12/28/2023   BUN 13 12/28/2023   NA 138 12/28/2023   K 3.5 12/28/2023   CL 102 12/28/2023   CO2 23 12/28/2023    Lab Results  Component Value Date   ALT 81 (H) 12/28/2023   AST 47 (H) 12/28/2023   ALKPHOS 366 (H) 12/28/2023   BILITOT 1.1 12/28/2023     Microbiology: Recent Results (from the past 240 hours)  Blood culture (routine x 2)     Status: None (Preliminary result)   Collection Time: 12/28/23  6:57 PM   Specimen: BLOOD  Result Value Ref Range Status   Specimen Description BLOOD RIGHT ANTECUBITAL  Final   Special Requests   Final    BOTTLES DRAWN AEROBIC AND ANAEROBIC Blood Culture adequate volume   Culture   Final    NO GROWTH < 12 HOURS Performed at [facility], [address], [place], [place] [address]    Report Status PENDING  Incomplete  Blood culture (routine x 2)     Status: None (Preliminary result)   Collection Time: 12/28/23  7:06 PM   Specimen: BLOOD RIGHT HAND  Result Value Ref Range Status   Specimen Description BLOOD RIGHT HAND  Final   Special Requests   Final    BOTTLES DRAWN AEROBIC AND ANAEROBIC Blood Culture adequate volume   Culture   Final    NO GROWTH < 12 HOURS Performed at [facility], [address] [place], [place] [address]    Report Status PENDING  Incomplete    Jomarie Fleeta Rothman, MD [facility] [phone] pager  12/29/2023, 12:25 PM

## 2023-12-29 NOTE — Assessment & Plan Note (Signed)
 Alk phos seems chronically elevated and ALT has been up in the past.  AST/ALT up slightly at the time of admission, so doubt fluconazole.  Presumably EtOH or MASLD. - Trend LFTs - Follow up with PCP

## 2023-12-29 NOTE — ED Notes (Signed)
 Pt unable to tolerate taste of potassium drink. Admitting MD made aware

## 2023-12-29 NOTE — Assessment & Plan Note (Signed)
-   Supp Ca

## 2023-12-30 ENCOUNTER — Inpatient Hospital Stay (HOSPITAL_COMMUNITY)

## 2023-12-30 DIAGNOSIS — E876 Hypokalemia: Secondary | ICD-10-CM | POA: Diagnosis not present

## 2023-12-30 DIAGNOSIS — R7401 Elevation of levels of liver transaminase levels: Secondary | ICD-10-CM | POA: Diagnosis not present

## 2023-12-30 DIAGNOSIS — R7989 Other specified abnormal findings of blood chemistry: Secondary | ICD-10-CM | POA: Diagnosis not present

## 2023-12-30 DIAGNOSIS — B377 Candidal sepsis: Secondary | ICD-10-CM | POA: Diagnosis not present

## 2023-12-30 DIAGNOSIS — I330 Acute and subacute infective endocarditis: Secondary | ICD-10-CM | POA: Diagnosis not present

## 2023-12-30 LAB — COMPREHENSIVE METABOLIC PANEL WITH GFR
ALT: 44 U/L (ref 0–44)
AST: 16 U/L (ref 15–41)
Albumin: 2.1 g/dL — ABNORMAL LOW (ref 3.5–5.0)
Alkaline Phosphatase: 258 U/L — ABNORMAL HIGH (ref 38–126)
Anion gap: 16 — ABNORMAL HIGH (ref 5–15)
BUN: 10 mg/dL (ref 8–23)
CO2: 23 mmol/L (ref 22–32)
Calcium: 8.1 mg/dL — ABNORMAL LOW (ref 8.9–10.3)
Chloride: 100 mmol/L (ref 98–111)
Creatinine, Ser: 0.58 mg/dL (ref 0.44–1.00)
GFR, Estimated: >60 mL/min (ref >60)
Glucose, Bld: 111 mg/dL — ABNORMAL HIGH (ref 70–99)
Potassium: 3.2 mmol/L — ABNORMAL LOW (ref 3.5–5.1)
Sodium: 139 mmol/L (ref 135–145)
Total Bilirubin: 1 mg/dL (ref 0.0–1.2)
Total Protein: 6.4 g/dL — ABNORMAL LOW (ref 6.5–8.1)

## 2023-12-30 LAB — CBC
HCT: 31.2 % — ABNORMAL LOW (ref 36.0–46.0)
Hemoglobin: 10.4 g/dL — ABNORMAL LOW (ref 12.0–15.0)
MCH: 31.9 pg (ref 26.0–34.0)
MCHC: 33.3 g/dL (ref 30.0–36.0)
MCV: 95.7 fL (ref 80.0–100.0)
Platelets: 306 K/uL (ref 150–400)
RBC: 3.26 MIL/uL — ABNORMAL LOW (ref 3.87–5.11)
RDW: 12.8 % (ref 11.5–15.5)
WBC: 6.8 K/uL (ref 4.0–10.5)
nRBC: 0 % (ref 0.0–0.2)

## 2023-12-30 LAB — URINE CULTURE: Culture: NO GROWTH

## 2023-12-30 LAB — HIV ANTIBODY (ROUTINE TESTING W REFLEX): HIV Screen 4th Generation wRfx: NONREACTIVE

## 2023-12-30 MED ORDER — ONDANSETRON HCL 4 MG PO TABS: 4.0000 mg | ORAL_TABLET | Freq: Three times a day (TID) | ORAL | Status: DC | PRN

## 2023-12-30 MED ORDER — CALCIUM CARBONATE 1250 (500 CA) MG PO TABS
1.0000 | ORAL_TABLET | Freq: Every day | ORAL | Status: DC
  Administered 2023-12-31 – 2024-01-09 (×8): 1250 mg via ORAL
  Filled 2023-12-30 (×9): qty 1

## 2023-12-30 MED ORDER — POTASSIUM CHLORIDE CRYS ER 20 MEQ PO TBCR
40.0000 meq | EXTENDED_RELEASE_TABLET | Freq: Every day | ORAL | Status: DC
  Administered 2023-12-30 – 2024-01-06 (×6): 40 meq via ORAL
  Filled 2023-12-30 (×7): qty 2

## 2023-12-30 MED ORDER — PANTOPRAZOLE SODIUM 20 MG PO TBEC
20.0000 mg | DELAYED_RELEASE_TABLET | Freq: Every day | ORAL | Status: DC
  Administered 2023-12-30 – 2024-01-09 (×8): 20 mg via ORAL
  Filled 2023-12-30 (×10): qty 1

## 2023-12-30 NOTE — Care Management Obs Status (Signed)
 MEDICARE OBSERVATION STATUS NOTIFICATION   Patient Details  Name: Tracy Baker MRN: [id number] Date of Birth: 06-16-1955   Medicare Observation Status Notification Given:  Yes    Marval Gell, RN 12/30/2023, 2:48 PM

## 2023-12-30 NOTE — Care Management CC44 (Signed)
 Condition Code 44 Documentation Completed  Patient Details  Name: Cloria Ciresi MRN: [id number] Date of Birth: 1955-08-20   Condition Code 44 given:  Yes Patient signature on Condition Code 44 notice:  Yes Documentation of 2 MD's agreement:  Yes Code 44 added to claim:  Yes    Marval Gell, RN 12/30/2023, 2:48 PM

## 2023-12-30 NOTE — Progress Notes (Signed)
 Progress Note   Patient: Tracy Baker FMW:[id number] DOB: 1955-05-01 DOA: 12/28/2023     1 DOS: the patient was seen and examined on 12/30/2023   Brief hospital course: 68 y.o. F with history nephrolithiasis otherwise healthy, presents by private vehicle after leaving [facility] in [facility] where she was being treated for C. albicans candidemia.  First underwent ureteral stent placement and lithotripsy at [facility] 10/10, then stent removal 10/14.  After stent removal, she developed fever, chills, malaise.  She went to [place] on vacation, where she got worse and presented to the ER at [facility].  There, CT showed stranding around the right kidney and ureter.  Urology was consulted and she was admitted on Rocephin but urine and blood cultures subsequently turned positive for candida albicans. ID were consulted.   Treated with fluconazole.  TTE normal.    Per d/c summary (results not in careEveywhere that I can see): 10/18 urine culture: Yeast 10/18 blood culture: Candida albicans in 1/2 10/20 blood culture: Budding yeast 10/22 blood culture: results unavailable  On hospital day 7, the patient requested to leave AMA and drive herself to [facility], as she didn't understand the medical plan or plans for discharge, she wished to be close to home, and efforts to transfer directly from [facility] were unsuccessful.  In the ER here, vitals WNL.  Blood cultures obtained and she was given Diflucan and the hospitalist service was asked to evaluate.  Assessment and Plan: * C. albicans candidemia (HCC) Treatment already initiated in [place].  Repeat cultures on 10/20 still had candida present.  Repeat from 10/22 not available.  TTE reportedly negative. ID team evaluated her, advised Micafungin. Awaiting fungal sensitivities from [facility]. TEE to be scheduled per cardiology, NPO past midnight. Ophthalmology will be consulted for fundoscopy exam. Repeat cultures so far negative, urine cultures no  growth.  Transaminitis Alk phos seems chronically elevated, today 258, AST/ ALT improved. Abnormal LFT presumably EtOH or ASLD. Trend LFTs. Avoid nephrotoxic drugs. Follow up with PCP  Hypocalcemia Will give daily oscal.  Hypokalemia She wishes to avoid kcl supplements. Discussed about low K, wants to eat banana. Follow daily electrolytes.    Out of bed to chair. Incentive spirometry. Nursing supportive care. Fall, aspiration precautions. Diet:  Diet Orders (From admission, onward)     Start     Ordered   12/31/23 0001  Diet NPO time specified  Diet effective midnight        12/30/23 1313   12/29/23 0454  Diet regular Room service appropriate? Yes; Fluid consistency: Thin  Diet effective now       Question Answer Comment  Room service appropriate? Yes   Fluid consistency: Thin      12/29/23 0453           DVT prophylaxis: enoxaparin (LOVENOX) injection 40 mg Start: 12/29/23 1600  Level of care: Med-Surg   Code Status: Full Code  Subjective: Patient is seen and examined today morning. She is feeling better, eating fair. Encouraged out of bed to chair.  Physical Exam: Vitals:   12/29/23 1742 12/29/23 2200 12/30/23 0600 12/30/23 0929  BP: (!) 144/72 124/67 110/69 136/70  Pulse:   62 (!) 59  Resp: 18 18 16 16   Temp: 98 F (36.7 C) 99 F (37.2 C) 98.3 F (36.8 C) 97.7 F (36.5 C)  TempSrc: Oral Oral Oral Oral  SpO2: 98% 93%  95%  Weight:      Height: 5' 4 (1.626 m)  General - Elderly Caucasian female, no apparent distress HEENT - PERRLA, EOMI, atraumatic head, non tender sinuses. Lung - Clear, diffuse rhonchi, no wheezes. Heart - S1, S2 heard, no murmurs, rubs, trace pedal edema. Abdomen - Soft, non tender, bowel sounds good Neuro - Alert, awake and oriented x 3, non focal exam. Skin - Warm and dry.  Data Reviewed:      Latest Ref Rng & Units 12/30/2023    3:26 AM 12/28/2023    7:50 PM 12/28/2023    6:31 PM  CBC  WBC 4.0 - 10.5 K/uL 6.8    8.6   Hemoglobin 12.0 - 15.0 g/dL 89.5  82.9  88.0   Hematocrit 36.0 - 46.0 % 31.2  50.0  36.5   Platelets 150 - 400 K/uL 306   338       Latest Ref Rng & Units 12/30/2023    3:26 AM 12/28/2023    7:50 PM 12/28/2023    6:31 PM  BMP  Glucose 70 - 99 mg/dL 888  99  899   BUN 8 - 23 mg/dL 10  13  11    Creatinine 0.44 - 1.00 mg/dL 9.41  9.39  9.33   Sodium 135 - 145 mmol/L 139  138  136   Potassium 3.5 - 5.1 mmol/L 3.2  3.5  3.3   Chloride 98 - 111 mmol/L 100  102  100   CO2 22 - 32 mmol/L 23   23   Calcium 8.9 - 10.3 mg/dL 8.1   8.6    US  Abdomen Limited RUQ (LIVER/GB) Result Date: 12/30/2023 CLINICAL DATA:  68 year old female with history of elevated liver function test. EXAM: ULTRASOUND ABDOMEN LIMITED RIGHT UPPER QUADRANT COMPARISON:  CT abdomen pelvis from 11/16/2023. FINDINGS: Gallbladder: Surgically absent. Common bile duct: Diameter: 5.2 mm Liver: No focal lesion identified. Within normal limits in parenchymal echogenicity and echotexture. Portal vein is patent on color Doppler imaging with normal direction of blood flow towards the liver. Other: No perihepatic ascites. IMPRESSION: Normal sonographic appearance of the liver without evidence of intra- or extra-hepatic biliary ductal dilation. Ester Sides, MD [facility] Radiology Electronically Signed   By: Ester Sides M.D.   On: 12/30/2023 06:53   DG Chest 2 View Result Date: 12/28/2023 EXAM: 2 VIEW(S) XRAY OF THE CHEST 12/28/2023 07:21:00 PM COMPARISON: None available. CLINICAL HISTORY: shortness of breath, cough, previously diagnosed pneumonia. Per note: Pt presents to the emergency department with a chief complaint of possible fungal blood infection. Patient recently left AMA from a hospital in [place]  and came straight here for evaluation. At the hospital in [place]  patient was noted to have pneumonia, fungal blood infection, pyelonephritis, as well as other findings.  Patient's only current complaint is overall lethargy and fatigue. Denies abdominal pain or flank pain. Denies urinary symptoms. Patient states that she ran episodic fevers while in the hospital. Denies chest pain or shortness of breath. FINDINGS: LUNGS AND PLEURA: Streaky bibasilar opacities, favor atelectasis/scarring. No pulmonary edema. No pleural effusion. No pneumothorax. HEART AND MEDIASTINUM: No acute abnormality of the cardiac and mediastinal silhouettes. BONES AND SOFT TISSUES: No acute osseous abnormality. IMPRESSION: 1. No acute findings. Electronically signed by: Pinkie Pebbles MD 12/28/2023 07:30 PM EDT RP Workstation: [id number]    Family Communication: Discussed with patient, understand and agree. All questions answered.  Disposition: Status is: Inpatient Remains inpatient appropriate because: IV antifungal, waiting repeat cultures, TEE, eye exam  Planned Discharge Destination: Home with Home Health  Time spent: 44 minutes  Author: Concepcion Riser, MD 12/30/2023 1:13 PM Secure chat 7am to 7pm For on call review [email or web address].

## 2023-12-31 DIAGNOSIS — B377 Candidal sepsis: Secondary | ICD-10-CM | POA: Diagnosis not present

## 2023-12-31 DIAGNOSIS — I330 Acute and subacute infective endocarditis: Secondary | ICD-10-CM | POA: Diagnosis not present

## 2023-12-31 DIAGNOSIS — R7401 Elevation of levels of liver transaminase levels: Secondary | ICD-10-CM | POA: Diagnosis not present

## 2023-12-31 DIAGNOSIS — E876 Hypokalemia: Secondary | ICD-10-CM | POA: Diagnosis not present

## 2023-12-31 DIAGNOSIS — N200 Calculus of kidney: Secondary | ICD-10-CM

## 2023-12-31 LAB — CBC
HCT: 31.7 % — ABNORMAL LOW (ref 36.0–46.0)
Hemoglobin: 10.3 g/dL — ABNORMAL LOW (ref 12.0–15.0)
MCH: 31.6 pg (ref 26.0–34.0)
MCHC: 32.5 g/dL (ref 30.0–36.0)
MCV: 97.2 fL (ref 80.0–100.0)
Platelets: 323 K/uL (ref 150–400)
RBC: 3.26 MIL/uL — ABNORMAL LOW (ref 3.87–5.11)
RDW: 12.5 % (ref 11.5–15.5)
WBC: 6.1 K/uL (ref 4.0–10.5)
nRBC: 0 % (ref 0.0–0.2)

## 2023-12-31 LAB — COMPREHENSIVE METABOLIC PANEL WITH GFR
ALT: 35 U/L (ref 0–44)
AST: 14 U/L — ABNORMAL LOW (ref 15–41)
Albumin: 2.2 g/dL — ABNORMAL LOW (ref 3.5–5.0)
Alkaline Phosphatase: 305 U/L — ABNORMAL HIGH (ref 38–126)
Anion gap: 12 (ref 5–15)
BUN: 9 mg/dL (ref 8–23)
CO2: 23 mmol/L (ref 22–32)
Calcium: 8.3 mg/dL — ABNORMAL LOW (ref 8.9–10.3)
Chloride: 103 mmol/L (ref 98–111)
Creatinine, Ser: 0.64 mg/dL (ref 0.44–1.00)
GFR, Estimated: >60 mL/min (ref >60)
Glucose, Bld: 100 mg/dL — ABNORMAL HIGH (ref 70–99)
Potassium: 3.4 mmol/L — ABNORMAL LOW (ref 3.5–5.1)
Sodium: 138 mmol/L (ref 135–145)
Total Bilirubin: 0.8 mg/dL (ref 0.0–1.2)
Total Protein: 6.5 g/dL (ref 6.5–8.1)

## 2023-12-31 NOTE — Progress Notes (Signed)
 Progress Note   Patient: Tracy Baker FMW:[id number] DOB: 06-11-1955 DOA: 12/28/2023     1 DOS: the patient was seen and examined on 12/31/2023   Brief hospital course: 68 y.o. F with history nephrolithiasis otherwise healthy, presents by private vehicle after leaving [facility] in [facility] where she was being treated for C. albicans candidemia.  First underwent ureteral stent placement and lithotripsy at [facility] 10/10, then stent removal 10/14.  After stent removal, she developed fever, chills, malaise.  She went to [place] [employer] on vacation, where she got worse and presented to the ER at [facility].  There, CT showed stranding around the right kidney and ureter.  Urology was consulted and she was admitted on Rocephin but urine and blood cultures subsequently turned positive for candida albicans. ID were consulted.   Treated with fluconazole.  TTE normal.    Per d/c summary (results not in careEveywhere that I can see): 10/18 urine culture: Yeast 10/18 blood culture: Candida albicans in 1/2 10/20 blood culture: Budding yeast 10/22 blood culture: results unavailable  On hospital day 7, the patient requested to leave AMA and drive herself to [facility], as she didn't understand the medical plan or plans for discharge, she wished to be close to home, and efforts to transfer directly from [facility] were unsuccessful.  In the ER here, vitals WNL.  Blood cultures obtained and she was given Diflucan and the hospitalist service was asked to evaluate.  Assessment and Plan: * C. albicans candidemia (HCC) Treatment already initiated in [place].  Repeat cultures on 10/20 still had candida present.  Repeat from 10/22 not available.  TTE reportedly negative. ID team evaluated her, advised Micafungin. Awaiting fungal sensitivities from [facility]. Possible change to oral if TEE negative. TEE to be scheduled tomorrow, NPO past midnight. Ophthalmology performed fundoscopy exam, no fungal ophthalmitis. Repeat cultures so  far negative, urine cultures no growth.  Transaminitis Alk phos seems chronically elevated, today 258, AST/ ALT improved. Abnormal LFT presumably EtOH or ASLD. Trend LFTs. Avoid nephrotoxic drugs. Follow up with PCP  Hypocalcemia Continue oscal.  Hypokalemia Discussed about low K, wants to eat banana. Wants to avoid supplements, Follow daily electrolytes.    Out of bed to chair. Incentive spirometry. Nursing supportive care. Fall, aspiration precautions. Diet:  Diet Orders (From admission, onward)     Start     Ordered   01/01/24 0001  Diet NPO time specified  Diet effective midnight        12/31/23 1011   12/31/23 1012  Diet regular Room service appropriate? Yes; Fluid consistency: Thin  Diet effective now       Question Answer Comment  Room service appropriate? Yes   Fluid consistency: Thin      12/31/23 1011           DVT prophylaxis: enoxaparin (LOVENOX) injection 40 mg Start: 12/29/23 1600  Level of care: Med-Surg   Code Status: Full Code  Subjective: Patient is seen and examined today morning. She denies any complaints, eating fair.  Physical Exam: Vitals:   12/30/23 1654 12/30/23 2000 12/31/23 0900 12/31/23 1625  BP: (!) 165/69 (!) 151/72 (!) 161/67 (!) 138/53  Pulse: 64 63 61 69  Resp: 19 18 19 18   Temp: (!) 97.5 F (36.4 C) 98 F (36.7 C) 98.1 F (36.7 C) 98.2 F (36.8 C)  TempSrc: Oral Oral Oral Oral  SpO2: 97% 94% 94% 96%  Weight:      Height:        General - Elderly Caucasian  female, no apparent distress HEENT - PERRLA, EOMI, atraumatic head, non tender sinuses. Lung - Clear, diffuse rhonchi, no wheezes. Heart - S1, S2 heard, no murmurs, rubs, trace pedal edema. Abdomen - Soft, non tender, bowel sounds good Neuro - Alert, awake and oriented x 3, non focal exam. Skin - Warm and dry.  Data Reviewed:      Latest Ref Rng & Units 12/31/2023    5:01 AM 12/30/2023    3:26 AM 12/28/2023    7:50 PM  CBC  WBC 4.0 - 10.5 K/uL 6.1  6.8     Hemoglobin 12.0 - 15.0 g/dL 89.6  89.5  82.9   Hematocrit 36.0 - 46.0 % 31.7  31.2  50.0   Platelets 150 - 400 K/uL 323  306        Latest Ref Rng & Units 12/31/2023    5:01 AM 12/30/2023    3:26 AM 12/28/2023    7:50 PM  BMP  Glucose 70 - 99 mg/dL 899  888  99   BUN 8 - 23 mg/dL 9  10  13    Creatinine 0.44 - 1.00 mg/dL 9.35  9.41  9.39   Sodium 135 - 145 mmol/L 138  139  138   Potassium 3.5 - 5.1 mmol/L 3.4  3.2  3.5   Chloride 98 - 111 mmol/L 103  100  102   CO2 22 - 32 mmol/L 23  23    Calcium 8.9 - 10.3 mg/dL 8.3  8.1     US  Abdomen Limited RUQ (LIVER/GB) Result Date: 12/30/2023 CLINICAL DATA:  69 year old female with history of elevated liver function test. EXAM: ULTRASOUND ABDOMEN LIMITED RIGHT UPPER QUADRANT COMPARISON:  CT abdomen pelvis from 11/16/2023. FINDINGS: Gallbladder: Surgically absent. Common bile duct: Diameter: 5.2 mm Liver: No focal lesion identified. Within normal limits in parenchymal echogenicity and echotexture. Portal vein is patent on color Doppler imaging with normal direction of blood flow towards the liver. Other: No perihepatic ascites. IMPRESSION: Normal sonographic appearance of the liver without evidence of intra- or extra-hepatic biliary ductal dilation. Ester Sides, MD [facility] Radiology Electronically Signed   By: Ester Sides M.D.   On: 12/30/2023 06:53    Family Communication: Discussed with patient, understand and agree. All questions answered.  Disposition: Status is: Inpatient Remains inpatient appropriate because: IV antifungal, waiting repeat cultures, TEE, eye exam  Planned Discharge Destination: Home with Home Health     Time spent: 43 minutes  Author: Concepcion Riser, MD 12/31/2023 6:53 PM Secure chat 7am to 7pm For on call review [email or web address].

## 2023-12-31 NOTE — Plan of Care (Signed)
  Problem: Nutrition: Goal: Adequate nutrition will be maintained Outcome: Progressing   Problem: Coping: Goal: Level of anxiety will decrease Outcome: Progressing   Problem: Elimination: Goal: Will not experience complications related to bowel motility Outcome: Progressing   Problem: Pain Managment: Goal: General experience of comfort will improve and/or be controlled Outcome: Progressing   Problem: Safety: Goal: Ability to remain free from injury will improve Outcome: Progressing   Problem: Skin Integrity: Goal: Risk for impaired skin integrity will decrease Outcome: Progressing

## 2023-12-31 NOTE — TOC CM/SW Note (Signed)
 Transition of Care [facility]) - Inpatient Brief Assessment   Patient Details  Name: Tracy Baker MRN: [id number] Date of Birth: 1955/06/26  Transition of Care [facility]) CM/SW Contact:    Lauraine FORBES Saa, LCSWA Phone Number: 12/31/2023, 3:23 PM   Clinical Narrative:  3:23 PM Per chart review, patient resides at home alone. Patient has a PCP and insurance. Patient does not have SNF/HH/DME history. Patient's preferred pharmacy is [facility] [address]. Patient declined CSW offer of SDOH (social connections) resources. No other TOC needs identified at this time. TOC will continue to follow.  Transition of Care Asessment: Insurance and Status: Insurance coverage has been reviewed Patient has primary care physician: Yes Home environment has been reviewed: Private Residence Prior level of function:: N/A Prior/Current Home Services: No current home services Social Drivers of Health Review: SDOH reviewed no interventions necessary Readmission risk has been reviewed: Yes (Currently Green 10%) Transition of care needs: no transition of care needs at this time

## 2023-12-31 NOTE — Plan of Care (Signed)
?  Problem: Education: ?Goal: Knowledge of General Education information will improve ?Description: Including pain rating scale, medication(s)/side effects and non-pharmacologic comfort measures ?Outcome: Progressing ?  ?Problem: Clinical Measurements: ?Goal: Will remain free from infection ?Outcome: Progressing ?  ?Problem: Activity: ?Goal: Risk for activity intolerance will decrease ?Outcome: Adequate for Discharge ?  ?

## 2023-12-31 NOTE — Progress Notes (Signed)
 [facility]  Date of Admission:  12/28/2023      Total days of antibiotics 3  Micafungin 10/25 >>         ASSESSMENT: Tracy Baker is a 68 y.o. female admitted with candida albicans bloodstream infection (recent hospitalization for the same at [facility]).   Candidemia -  Cystoscopy done on 10/10 for ureteral stent and lithotripsy with hospitalization 10/18 following procedure with flank pain ever since, N/V, hematuria chills and fevers. Stent was removed 5d after procedure.  We are able to see micro data from the blood cultures on 10/18 revealing fluconazole sensitive candida albicans. 10/20 only updated to 'budding yeast' but should cross over for final cultures soon. Her blood cultures on fluconazole during current hospital stay 1024 remain negative preliminarily.  She has planned TEE on 10/28 to ensure no endocardial infection. We have also asked ophthalmology for fundoscopic eye exam to help rule out endophthalmitis though low concern given no acute visual symptoms.  - Will continue micafungin until TEE results are available with hopes to switch back to fluconazole  - appreciate Dr. Fleeta seeing her today to rule out endophthalmitis   Recent Cystoscopy with Stent placement and lithotripsy before acute illness - The source of bacteremia event so it seems - waiting on w/u to rule out other sequelae.    PLAN: - Will continue micafungin until TEE results are available with hopes to switch back to fluconazole  - appreciate Dr. Fleeta seeing her today to rule out endophthalmitis   Principal Problem:   C. albicans candidemia (HCC) Active Problems:   Hypokalemia   Hypocalcemia   Transaminitis    calcium carbonate  1 tablet Oral Q breakfast   enoxaparin (LOVENOX) injection  40 mg Subcutaneous Q24H   pantoprazole   20 mg Oral Daily   potassium chloride  40 mEq Oral Daily    SUBJECTIVE: She is doing OK. She has no visual symptoms to remark on. Planning  TEE tomorrow 10/28 Discussed the results of the outside hospital susceptibilities.   Review of Systems: Review of Systems  Constitutional:  Negative for chills and fever.  HENT:  Negative for tinnitus.   Eyes:  Negative for blurred vision and photophobia.  Respiratory:  Negative for cough and sputum production.   Cardiovascular:  Negative for chest pain.  Gastrointestinal:  Negative for diarrhea, nausea and vomiting.  Genitourinary:  Negative for dysuria.  Skin:  Negative for rash.  Neurological:  Negative for headaches.     Allergies  Allergen Reactions   Codeine Anaphylaxis and Nausea And Vomiting   Red Dye #40 (Allura Red) Nausea Only    OBJECTIVE: Vitals:   12/30/23 0929 12/30/23 1654 12/30/23 2000 12/31/23 0900  BP: 136/70 (!) 165/69 (!) 151/72 (!) 161/67  Pulse: (!) 59 64 63 61  Resp: 16 19 18 19   Temp: 97.7 F (36.5 C) (!) 97.5 F (36.4 C) 98 F (36.7 C) 98.1 F (36.7 C)  TempSrc: Oral Oral Oral Oral  SpO2: 95% 97% 94% 94%  Weight:      Height:       Body mass index is 26.45 kg/m.  Physical Exam Vitals reviewed.  Constitutional:      Appearance: Normal appearance. She is not ill-appearing.  HENT:     Mouth/Throat:     Mouth: Mucous membranes are moist.     Pharynx: Oropharynx is clear.  Eyes:     General: No scleral icterus. Cardiovascular:  Rate and Rhythm: Normal rate and regular rhythm.  Pulmonary:     Effort: Pulmonary effort is normal.  Neurological:     Mental Status: She is oriented to person, place, and time.  Psychiatric:        Mood and Affect: Mood normal.        Thought Content: Thought content normal.     Lab Results Lab Results  Component Value Date   WBC 6.1 12/31/2023   HGB 10.3 (L) 12/31/2023   HCT 31.7 (L) 12/31/2023   MCV 97.2 12/31/2023   PLT 323 12/31/2023    Lab Results  Component Value Date   CREATININE 0.64 12/31/2023   BUN 9 12/31/2023   NA 138 12/31/2023   K 3.4 (L) 12/31/2023   CL 103 12/31/2023   CO2  23 12/31/2023    Lab Results  Component Value Date   ALT 35 12/31/2023   AST 14 (L) 12/31/2023   ALKPHOS 305 (H) 12/31/2023   BILITOT 0.8 12/31/2023     Microbiology: Recent Results (from the past 240 hours)  Blood culture (routine x 2)     Status: None (Preliminary result)   Collection Time: 12/28/23  6:57 PM   Specimen: BLOOD  Result Value Ref Range Status   Specimen Description BLOOD RIGHT ANTECUBITAL  Final   Special Requests   Final    BOTTLES DRAWN AEROBIC AND ANAEROBIC Blood Culture adequate volume   Culture   Final    NO GROWTH 3 DAYS Performed at [facility], [address] [place], [place] [address]    Report Status PENDING  Incomplete  Blood culture (routine x 2)     Status: None (Preliminary result)   Collection Time: 12/28/23  7:06 PM   Specimen: BLOOD RIGHT HAND  Result Value Ref Range Status   Specimen Description BLOOD RIGHT HAND  Final   Special Requests   Final    BOTTLES DRAWN AEROBIC AND ANAEROBIC Blood Culture adequate volume   Culture   Final    NO GROWTH 3 DAYS Performed at [facility], [address], [place], [place] [address]    Report Status PENDING  Incomplete  Urine Culture     Status: None   Collection Time: 12/29/23  8:37 AM   Specimen: Urine, Clean Catch  Result Value Ref Range Status   Specimen Description URINE, CLEAN CATCH  Final   Special Requests NONE  Final   Culture   Final    NO GROWTH Performed at [facility], [address], [place], [place] [address]    Report Status 12/30/2023 FINAL  Final     Corean Fireman, MSN, NP-C [facility]  [email or web address] Pager: [phone] Office: [phone] [facility] Main Line: [phone] *Secure Chat Communication Welcome  Total Encounter Time: 10 min

## 2023-12-31 NOTE — Consult Note (Signed)
 Ophthalmology Consult Note   Subjective:  68 y.o. F with history nephrolithiasis s/p lithotripsy and ureteral stent placement and removal complicated by kidney infection with candida albicans. She was treated with fluconazole at [facility]. After 7 days at [facility], patient wanted to leave AMA to be treated in [place]. She went to [facility] ED after leaving AMA and was admitted for treatment of candidemia. Currently on micafungin. Patient denies any changes in vision, floaters, or eye pain.   Objective: Vital signs in last 24 hours: Temp:  [97.5 F (36.4 C)-98.1 F (36.7 C)] 98.1 F (36.7 C) (10/27 0900) Pulse Rate:  [61-64] 61 (10/27 0900) Resp:  [18-19] 19 (10/27 0900) BP: (151-165)/(67-72) 161/67 (10/27 0900) SpO2:  [94 %-97 %] 94 % (10/27 0900) Weight change:  Last BM Date :  (PTA)  Intake/Output from previous day: 10/26 0701 - 10/27 0700 In: 480 [P.O.:480] Out: -  Intake/Output this shift: No intake/output data recorded.  Base Eye Exam  Visual Acuity (ETDRS)   Right Left  Near CC 20/25 20/30  Dist ph [place]     Tonometry (Tonopen)   Right Left  Pressure 12 13   Pupils   APD  Right None  Left None   Visual Fields   Right Left   Full Full   Extraocular Movement   Right Left   Full Full   Neuro/Psych  Oriented x3: Yes  Mood/Affect: Normal         Slit Lamp and Fundus Exam   External Exam   Right Left  External Normal Normal still with 2+ lid edema   Slit Lamp Exam   Right Left  Lids/Lashes Normal Normal  Conjunctiva/Sclera White and quiet White and quiet  Cornea Clear Clear  Anterior Chamber Deep and quiet Deep and quiet  Iris Grossly normal Grossly normal  Lens         Fundus Exam   Right Clear  Posterior Vitreous Clear, no vitritis Clear, no vitritis  Disc Pink, sharp margins Pink, sharp margins  C/D Ratio 0.35 0.35  Macula Flat Flat  Vessels Normal course and caliber Normal course and caliber  Periphery Attached, no fluffy CR  infiltrates, no hemorrhages Attached, no fluffy CR infiltrates, no hemorrhages     Recent Labs    12/30/23 0326 12/31/23 0501  WBC 6.8 6.1  HGB 10.4* 10.3*  HCT 31.2* 31.7*  NA 139 138  K 3.2* 3.4*  CL 100 103  CO2 23 23  BUN 10 9  CREATININE 0.58 0.64    Studies/Results: US  Abdomen Limited RUQ (LIVER/GB) Result Date: 12/30/2023 CLINICAL DATA:  68 year old female with history of elevated liver function test. EXAM: ULTRASOUND ABDOMEN LIMITED RIGHT UPPER QUADRANT COMPARISON:  CT abdomen pelvis from 11/16/2023. FINDINGS: Gallbladder: Surgically absent. Common bile duct: Diameter: 5.2 mm Liver: No focal lesion identified. Within normal limits in parenchymal echogenicity and echotexture. Portal vein is patent on color Doppler imaging with normal direction of blood flow towards the liver. Other: No perihepatic ascites. IMPRESSION: Normal sonographic appearance of the liver without evidence of intra- or extra-hepatic biliary ductal dilation. Ester Sides, MD [facility] Radiology Electronically Signed   By: Ester Sides M.D.   On: 12/30/2023 06:53    Medications: I have reviewed the patient's current medications.  Assessment/Plan:  Candidemia -No fungal endophthalmitis on dilated fundus exam. If patient develops any eye symptoms, please reconsult ophthalmology.   LOS: 1 day   Alanee Ting T Loris Winrow 12/31/2023

## 2024-01-01 ENCOUNTER — Encounter (HOSPITAL_COMMUNITY): Payer: Self-pay | Admitting: Family Medicine

## 2024-01-01 ENCOUNTER — Ambulatory Visit: Payer: Self-pay | Admitting: *Deleted

## 2024-01-01 ENCOUNTER — Observation Stay (HOSPITAL_COMMUNITY)

## 2024-01-01 ENCOUNTER — Encounter (HOSPITAL_COMMUNITY)
Admission: EM | Disposition: A | Discharge status: Rehabilitation Facility | Payer: Self-pay | Source: Home / Self Care | Attending: Thoracic Surgery (Cardiothoracic Vascular Surgery)

## 2024-01-01 DIAGNOSIS — N200 Calculus of kidney: Secondary | ICD-10-CM | POA: Diagnosis not present

## 2024-01-01 DIAGNOSIS — I059 Rheumatic mitral valve disease, unspecified: Secondary | ICD-10-CM

## 2024-01-01 DIAGNOSIS — I38 Endocarditis, valve unspecified: Secondary | ICD-10-CM | POA: Diagnosis not present

## 2024-01-01 DIAGNOSIS — R9439 Abnormal result of other cardiovascular function study: Secondary | ICD-10-CM | POA: Diagnosis not present

## 2024-01-01 DIAGNOSIS — B49 Unspecified mycosis: Secondary | ICD-10-CM | POA: Diagnosis not present

## 2024-01-01 DIAGNOSIS — R7401 Elevation of levels of liver transaminase levels: Secondary | ICD-10-CM | POA: Diagnosis not present

## 2024-01-01 DIAGNOSIS — E876 Hypokalemia: Secondary | ICD-10-CM | POA: Diagnosis not present

## 2024-01-01 DIAGNOSIS — B377 Candidal sepsis: Secondary | ICD-10-CM | POA: Diagnosis not present

## 2024-01-01 DIAGNOSIS — I330 Acute and subacute infective endocarditis: Secondary | ICD-10-CM | POA: Diagnosis not present

## 2024-01-01 DIAGNOSIS — B3789 Other sites of candidiasis: Secondary | ICD-10-CM | POA: Diagnosis not present

## 2024-01-01 HISTORY — PX: TRANSESOPHAGEAL ECHOCARDIOGRAM (CATH LAB): EP1270

## 2024-01-01 LAB — CBC
HCT: 31.4 % — ABNORMAL LOW (ref 36.0–46.0)
Hemoglobin: 10.7 g/dL — ABNORMAL LOW (ref 12.0–15.0)
MCH: 31.8 pg (ref 26.0–34.0)
MCHC: 34.1 g/dL (ref 30.0–36.0)
MCV: 93.2 fL (ref 80.0–100.0)
Platelets: 335 K/uL (ref 150–400)
RBC: 3.37 MIL/uL — ABNORMAL LOW (ref 3.87–5.11)
RDW: 12.3 % (ref 11.5–15.5)
WBC: 7.4 K/uL (ref 4.0–10.5)
nRBC: 0 % (ref 0.0–0.2)

## 2024-01-01 LAB — ECHO TEE

## 2024-01-01 LAB — COMPREHENSIVE METABOLIC PANEL WITH GFR
ALT: 29 U/L (ref 0–44)
AST: 12 U/L — ABNORMAL LOW (ref 15–41)
Albumin: 2.2 g/dL — ABNORMAL LOW (ref 3.5–5.0)
Alkaline Phosphatase: 303 U/L — ABNORMAL HIGH (ref 38–126)
Anion gap: 9 (ref 5–15)
BUN: 10 mg/dL (ref 8–23)
CO2: 25 mmol/L (ref 22–32)
Calcium: 8.4 mg/dL — ABNORMAL LOW (ref 8.9–10.3)
Chloride: 103 mmol/L (ref 98–111)
Creatinine, Ser: 0.64 mg/dL (ref 0.44–1.00)
GFR, Estimated: >60 mL/min (ref >60)
Glucose, Bld: 115 mg/dL — ABNORMAL HIGH (ref 70–99)
Potassium: 3.3 mmol/L — ABNORMAL LOW (ref 3.5–5.1)
Sodium: 137 mmol/L (ref 135–145)
Total Bilirubin: 0.8 mg/dL (ref 0.0–1.2)
Total Protein: 6.7 g/dL (ref 6.5–8.1)

## 2024-01-01 SURGERY — TRANSESOPHAGEAL ECHOCARDIOGRAM (TEE) (CATHLAB)
Anesthesia: Monitor Anesthesia Care

## 2024-01-01 MED ORDER — LIDOCAINE 2% (20 MG/ML) 5 ML SYRINGE
INTRAMUSCULAR | Status: DC | PRN
  Administered 2024-01-01: 80 mg via INTRAVENOUS

## 2024-01-01 MED ORDER — PROPOFOL 10 MG/ML IV BOLUS
INTRAVENOUS | Status: DC | PRN
  Administered 2024-01-01: 80 mg via INTRAVENOUS
  Administered 2024-01-01: 20 mg via INTRAVENOUS
  Administered 2024-01-01 (×2): 30 mg via INTRAVENOUS
  Administered 2024-01-01: 20 mg via INTRAVENOUS
  Administered 2024-01-01 (×3): 30 mg via INTRAVENOUS

## 2024-01-01 MED ORDER — SODIUM CHLORIDE 0.9 % IV SOLN: INTRAVENOUS | Status: DC

## 2024-01-01 NOTE — Progress Notes (Signed)
   [facility] has been requested to perform a transesophageal echocardiogram on Richardson Acosta for Bacteremia.     The patient does NOT have any absolute or relative contraindications to a Transesophageal Echocardiogram (TEE).  The patient has: No other conditions that may impact this procedure.   After careful review of history and examination, the risks and benefits of transesophageal echocardiogram have been explained including risks of esophageal damage, perforation (1:10,000 risk), bleeding, pharyngeal hematoma as well as other potential complications associated with conscious sedation including aspiration, arrhythmia, respiratory failure and death. Alternatives to treatment were discussed, questions were answered. Patient is willing to proceed.   Bonney Morse Clause, PA-C  01/01/2024 12:24 PM

## 2024-01-01 NOTE — Anesthesia Preprocedure Evaluation (Signed)
 Anesthesia Evaluation  Patient identified by MRN, date of birth, ID band Patient awake    Reviewed: Allergy & Precautions, H&P , NPO status , Patient's Chart, lab work & pertinent test results  Airway Mallampati: III  TM Distance: >3 FB Neck ROM: Full    Dental no notable dental hx. (+) Teeth Intact, Dental Advisory Given   Pulmonary neg pulmonary ROS, Current Smoker and Patient abstained from smoking.   Pulmonary exam normal breath sounds clear to auscultation       Cardiovascular negative cardio ROS  Rhythm:Regular Rate:Normal     Neuro/Psych negative neurological ROS  negative psych ROS   GI/Hepatic negative GI ROS, Neg liver ROS,,,  Endo/Other  negative endocrine ROS    Renal/GU negative Renal ROS  negative genitourinary   Musculoskeletal   Abdominal   Peds  Hematology negative hematology ROS (+)   Anesthesia Other Findings   Reproductive/Obstetrics negative OB ROS                              Anesthesia Physical Anesthesia Plan  ASA: 2  Anesthesia Plan: MAC   Post-op Pain Management: Minimal or no pain anticipated   Induction: Intravenous  PONV Risk Score and Plan: 1 and Propofol  infusion  Airway Management Planned: Natural Airway and Simple Face Mask  Additional Equipment:   Intra-op Plan:   Post-operative Plan:   Informed Consent: I have reviewed the patients History and Physical, chart, labs and discussed the procedure including the risks, benefits and alternatives for the proposed anesthesia with the patient or authorized representative who has indicated his/her understanding and acceptance.     Dental advisory given  Plan Discussed with: CRNA  Anesthesia Plan Comments:         Anesthesia Quick Evaluation

## 2024-01-01 NOTE — Anesthesia Postprocedure Evaluation (Signed)
 Anesthesia Post Note  Patient: Tracy Baker  Procedure(s) Performed: TRANSESOPHAGEAL ECHOCARDIOGRAM     Patient location during evaluation: Cath Lab Anesthesia Type: MAC Level of consciousness: awake and alert Pain management: pain level controlled Vital Signs Assessment: post-procedure vital signs reviewed and stable Respiratory status: spontaneous breathing, nonlabored ventilation and respiratory function stable Cardiovascular status: stable and blood pressure returned to baseline Postop Assessment: no apparent nausea or vomiting Anesthetic complications: no   There were no known notable events for this encounter.  Last Vitals:  Vitals:   01/01/24 1355 01/01/24 1400  BP: (!) 150/62 (!) 144/65  Pulse: (!) 59 62  Resp: 20 16  Temp:    SpO2: 93% 92%    Last Pain:  Vitals:   01/01/24 1340  TempSrc:   PainSc: 0-No pain                 Mansa Willers,W. EDMOND

## 2024-01-01 NOTE — Transfer of Care (Signed)
 Immediate Anesthesia Transfer of Care Note  Patient: Tracy Baker  Procedure(s) Performed: TRANSESOPHAGEAL ECHOCARDIOGRAM  Patient Location: Cath Lab  Anesthesia Type:MAC  Level of Consciousness: drowsy  Airway & Oxygen Therapy: Patient Spontanous Breathing and Patient connected to nasal cannula oxygen  Post-op Assessment: Report given to RN and Post -op Vital signs reviewed and stable  Post vital signs: Reviewed and stable  Last Vitals:  Vitals Value Taken Time  BP 123/66  81 01/01/24  1330  Temp    Pulse 67 01/01/24  1330  Resp 19 01/01/24  1330  SpO2 98 01/01/24  1330    Last Pain:  Vitals:   01/01/24 1249  TempSrc: Temporal  PainSc:       Patients Stated Pain Goal: 0 (12/31/23 0538)  Complications: There were no known notable events for this encounter.

## 2024-01-01 NOTE — Plan of Care (Signed)
   Problem: Education: Goal: Knowledge of General Education information will improve Description: Including pain rating scale, medication(s)/side effects and non-pharmacologic comfort measures Outcome: Not Progressing   Problem: Health Behavior/Discharge Planning: Goal: Ability to manage health-related needs will improve Outcome: Not Progressing   Problem: Clinical Measurements: Goal: Will remain free from infection Outcome: Not Progressing

## 2024-01-01 NOTE — Plan of Care (Signed)

## 2024-01-01 NOTE — Progress Notes (Addendum)
 Subjective: No new complaints   Antibiotics:  Anti-infectives (From admission, onward)    Start     Dose/Rate Route Frequency Ordered Stop   12/29/23 1100  micafungin (MYCAMINE) 150 mg in sodium chloride  0.9 % 100 mL IVPB        150 mg 107.5 mL/hr over 1 Hours Intravenous Every 24 hours 12/29/23 1005     12/29/23 1000  fluconazole (DIFLUCAN) IVPB 400 mg  Status:  Discontinued        400 mg 100 mL/hr over 120 Minutes Intravenous Every 24 hours 12/29/23 0424 12/29/23 1005       Medications: Scheduled Meds:  calcium carbonate  1 tablet Oral Q breakfast   enoxaparin (LOVENOX) injection  40 mg Subcutaneous Q24H   pantoprazole   20 mg Oral Daily   potassium chloride  40 mEq Oral Daily   Continuous Infusions:  micafungin (MYCAMINE) 150 mg in sodium chloride  0.9 % 100 mL IVPB Stopped (01/01/24 1242)   PRN Meds:.acetaminophen **OR** acetaminophen, ondansetron     Objective: Weight change:   Intake/Output Summary (Last 24 hours) at 01/01/2024 1730 Last data filed at 01/01/2024 1327 Gross per 24 hour  Intake 125 ml  Output --  Net 125 ml   Blood pressure (!) 148/73, pulse 69, temperature 98 F (36.7 C), temperature source Oral, resp. rate 18, height 5' 4 (1.626 m), weight 69.9 kg, SpO2 97%. Temp:  [97.4 F (36.3 C)-98.9 F (37.2 C)] 98 F (36.7 C) (10/28 1637) Pulse Rate:  [58-72] 69 (10/28 1637) Resp:  [16-22] 18 (10/28 1637) BP: (122-172)/(59-73) 148/73 (10/28 1637) SpO2:  [92 %-98 %] 97 % (10/28 1637)  Physical Exam: Physical Exam Constitutional:      General: She is not in acute distress.    Appearance: She is well-developed. She is not diaphoretic.  HENT:     Head: Normocephalic and atraumatic.     Right Ear: External ear normal.     Left Ear: External ear normal.     Mouth/Throat:     Pharynx: No oropharyngeal exudate.  Eyes:     General: No scleral icterus.    Conjunctiva/sclera: Conjunctivae normal.     Pupils: Pupils are equal, round, and  reactive to light.  Cardiovascular:     Rate and Rhythm: Normal rate and regular rhythm.  Pulmonary:     Effort: Pulmonary effort is normal. No respiratory distress.     Breath sounds: No wheezing.  Abdominal:     General: There is no distension.     Palpations: Abdomen is soft.  Musculoskeletal:        General: No tenderness. Normal range of motion.  Lymphadenopathy:     Cervical: No cervical adenopathy.  Skin:    General: Skin is warm and dry.     Coloration: Skin is not pale.     Findings: No erythema or rash.  Neurological:     General: No focal deficit present.     Mental Status: She is alert and oriented to person, place, and time.     Motor: No abnormal muscle tone.     Coordination: Coordination normal.  Psychiatric:        Mood and Affect: Mood normal.        Behavior: Behavior normal.        Thought Content: Thought content normal.        Judgment: Judgment normal.      CBC:    BMET Recent Labs  12/31/23 0501 01/01/24 0446  NA 138 137  K 3.4* 3.3*  CL 103 103  CO2 23 25  GLUCOSE 100* 115*  BUN 9 10  CREATININE 0.64 0.64  CALCIUM 8.3* 8.4*     Liver Panel  Recent Labs    12/31/23 0501 01/01/24 0446  PROT 6.5 6.7  ALBUMIN 2.2* 2.2*  AST 14* 12*  ALT 35 29  ALKPHOS 305* 303*  BILITOT 0.8 0.8       Sedimentation Rate No results for input(s): ESRSEDRATE in the last 72 hours. C-Reactive Protein No results for input(s): CRP in the last 72 hours.  Micro Results: Recent Results (from the past 720 hours)  Blood culture (routine x 2)     Status: None (Preliminary result)   Collection Time: 12/28/23  6:57 PM   Specimen: BLOOD  Result Value Ref Range Status   Specimen Description BLOOD RIGHT ANTECUBITAL  Final   Special Requests   Final    BOTTLES DRAWN AEROBIC AND ANAEROBIC Blood Culture adequate volume   Culture   Final    NO GROWTH 4 DAYS Performed at [facility], [address], [place], [place] [address]    Report  Status PENDING  Incomplete  Blood culture (routine x 2)     Status: None (Preliminary result)   Collection Time: 12/28/23  7:06 PM   Specimen: BLOOD RIGHT HAND  Result Value Ref Range Status   Specimen Description BLOOD RIGHT HAND  Final   Special Requests   Final    BOTTLES DRAWN AEROBIC AND ANAEROBIC Blood Culture adequate volume   Culture   Final    NO GROWTH 4 DAYS Performed at [facility], [address] [place], [place] [address]    Report Status PENDING  Incomplete  Urine Culture     Status: None   Collection Time: 12/29/23  8:37 AM   Specimen: Urine, Clean Catch  Result Value Ref Range Status   Specimen Description URINE, CLEAN CATCH  Final   Special Requests NONE  Final   Culture   Final    NO GROWTH Performed at [facility], [address], [place], [place] [address]    Report Status 12/30/2023 FINAL  Final    Studies/Results: ECHO TEE Result Date: 01/01/2024    TRANSESOPHOGEAL ECHO REPORT   Patient Name:   Tracy Baker Date of Exam: 01/01/2024 Medical Rec #:  [id number]      Height:       64.0 in Accession #:    [id number]     Weight:       154.1 lb Date of Birth:  [birth year]      BSA:          1.751 m Patient Age:    68 years       BP:           168/64 mmHg Patient Gender: F              HR:           80 bpm. Exam Location:  Inpatient Procedure: Transesophageal Echo, Cardiac Doppler, Color Doppler, Saline Contrast            Bubble Study and 3D Echo (Both Spectral and Color Flow Doppler were            utilized during procedure). Indications:     Endocarditis  History:         Patient has no prior history of Echocardiogram  examinations.                  Risk Factors:Current Smoker.  Sonographer:     Koleen Popper RDCS Referring Phys:  [id number] [facility] A SANTO Diagnosing Phys: Shelda Bruckner MD  Sonographer Comments: Image acquisition challenging due to respiratory motion. PROCEDURE: After discussion of the risks and benefits of a TEE, an informed  consent was obtained from the patient. The transesophogeal probe was passed without difficulty through the esophogus of the patient. Imaged were obtained with the patient in a left lateral decubitus position. Sedation performed by different physician. The patient was monitored while under deep sedation. Anesthestetic sedation was provided intravenously by Anesthesiology: 270mg  of Propofol , 80mg  of Lidocaine . The patient's vital signs; including heart rate, blood pressure, and oxygen saturation; remained stable throughout the procedure. The patient developed no complications during the procedure.  IMPRESSIONS  1. Left ventricular ejection fraction, by estimation, is 60 to 65%. The left ventricle has normal function.  2. Right ventricular systolic function is normal. The right ventricular size is normal. Mildly increased right ventricular wall thickness.  3. No left atrial/left atrial appendage thrombus was detected. The LAA emptying velocity was 53 cm/s.  4. There is a very small, mobile echodensity on the anterior leaflet of the mitral valve consistent with vegetation (see images 10, 29, 35). The mitral valve is abnormal. Trivial mitral valve regurgitation. No evidence of mitral stenosis.  5. The aortic valve is tricuspid. Aortic valve regurgitation is not visualized. No aortic stenosis is present.  6. Agitated saline contrast bubble study was positive with shunting observed within 3-6 cardiac cycles suggestive of interatrial shunt.  7. 3D performed of the mitral valve and demonstrates Small mobile echodensity on A2. Conclusion(s)/Recommendation(s): Small mobile echodensity on A2 segment of anterior mitral valve. Given clinical scenario, this is consistent with endocarditis. Findings communicated to primary and ID teams. FINDINGS  Left Ventricle: Left ventricular ejection fraction, by estimation, is 60 to 65%. The left ventricle has normal function. The left ventricular internal cavity size was normal in size. Right  Ventricle: The right ventricular size is normal. Mildly increased right ventricular wall thickness. Right ventricular systolic function is normal. Left Atrium: Left atrial size was normal in size. No left atrial/left atrial appendage thrombus was detected. The LAA emptying velocity was 53 cm/s. Right Atrium: Right atrial size was normal in size. Pericardium: There is no evidence of pericardial effusion. Mitral Valve: There is a very small, mobile echodensity on the anterior leaflet of the mitral valve consistent with vegetation (see images 10, 29, 35). The mitral valve is abnormal. Trivial mitral valve regurgitation. No evidence of mitral valve stenosis. Tricuspid Valve: The tricuspid valve is normal in structure. Tricuspid valve regurgitation is trivial. No evidence of tricuspid stenosis. There is no evidence of tricuspid valve vegetation. Aortic Valve: The aortic valve is tricuspid. Aortic valve regurgitation is not visualized. No aortic stenosis is present. There is no evidence of aortic valve vegetation. Pulmonic Valve: The pulmonic valve was grossly normal. Pulmonic valve regurgitation is trivial. No evidence of pulmonic stenosis. There is no evidence of pulmonic valve vegetation. Aorta: The aortic root and ascending aorta are structurally normal, with no evidence of dilitation. IAS/Shunts: No atrial level shunt detected by color flow Doppler. Agitated saline contrast was given intravenously to evaluate for intracardiac shunting. Agitated saline contrast bubble study was positive with shunting observed within 3-6 cardiac cycles suggestive of interatrial shunt. Additional Comments: Spectral Doppler performed. LEFT VENTRICLE PLAX 2D LVOT diam:  2.10 cm LVOT Area:     3.46 cm   AORTA Ao Root diam: 2.70 cm  SHUNTS Systemic Diam: 2.10 cm Shelda Bruckner MD Electronically signed by Shelda Bruckner MD Signature Date/Time: 01/01/2024/3:27:23 PM    Final    EP STUDY Result Date: 01/01/2024 See  surgical note for result.     Assessment/Plan:  INTERVAL HISTORY: TEE with vegetation on mitral valve   Principal Problem:   C. albicans candidemia (HCC) Active Problems:   Hypokalemia   Hypocalcemia   Transaminitis   Kidney stone   Endocarditis of mitral valve   Fungal endocarditis    Tracy Baker is a 68 y.o. female with candidemia after urologic procedure with a trip see placement of stent and removal of stent hospitalized in [place]  [facility].  Candida failed to clear with some blood cultures despite being on appropriate antifungal therapy prompting concern for fungal endocarditis.  She ended up leaving hospital in [place]  and drove to [facility] and is been admitted to our healthcare system.   Due to micafungin due to concerns for fungal endocarditis a couple of other concerns  Fortunately she did not have candidal endophthalmitis  Fortunately she has now been fine to have mitral valve endocarditis fungal endocarditis is in itself an indication for valve replacement as antifungal therapy alone is not curative in the situations.  Certainly if she cannot undergo surgery we would plan on giving her a 6-week course of micafungin followed by indefinite suppressive fluconazole.  She would seem to me to be an excellent surgical candidate as she was in quite a good state of health prior to her having had this procedure and become candidemic  I agree that she will need dental patient if she does indeed have CT surgery Dr. Kerrin is going to formally see the patient later tonight.  Exam and-itis: Not clear to me that this is related to her fluconazole.  Close had noticed fluctuation in this range before her right upper quadrant ultrasound was normal.  I personally spent a total of 52 minutes in the care of the patient today including preparing to see the patient, getting/reviewing separately obtained history, performing a medically appropriate  exam/evaluation, counseling and educating, placing orders, referring and communicating with other health care professionals, documenting clinical information in the EHR, communicating results, and coordinating care.  Evaluation of the patient requires complex antimicrobial therapy evaluation, counseling , isolation needs to reduce disease transmission and risk assessment and mitigation.   LOS: 1 day   Jomarie Fleeta Rothman 01/01/2024, 5:30 PM

## 2024-01-01 NOTE — Interval H&P Note (Signed)
 History and Physical Interval Note:  01/01/2024 1:01 PM  Tracy Baker  has presented today for surgery, with the diagnosis of fungemia.  The various methods of treatment have been discussed with the patient and family. After consideration of risks, benefits and other options for treatment, the patient has consented to  Procedure(s): TRANSESOPHAGEAL ECHOCARDIOGRAM (N/A) as a surgical intervention.  The patient's history has been reviewed, patient examined, no change in status, stable for surgery.  I have reviewed the patient's chart and labs.  Questions were answered to the patient's satisfaction.     Nickson Middlesworth Lonni

## 2024-01-01 NOTE — CV Procedure (Signed)
    TRANSESOPHAGEAL ECHOCARDIOGRAM   NAME:  Tracy Baker   MRN: [id number] DOB:  Aug 30, 1955   ADMIT DATE: 12/28/2023  INDICATIONS: Candidemia  PROCEDURE:   Informed consent was obtained prior to the procedure. The risks, benefits and alternatives for the procedure were discussed and the patient comprehended these risks.  Risks include, but are not limited to, cough, sore throat, vomiting, nausea, somnolence, esophageal and stomach trauma or perforation, bleeding, low blood pressure, aspiration, pneumonia, infection, trauma to the teeth and death.    Procedural time out performed. Patient received monitored anesthesia care under the supervision of Dr. Epifanio. Patient received a total of 270 mg propofol  during the procedure.  The transesophageal probe was inserted in the esophagus and stomach without difficulty and multiple views were obtained.    COMPLICATIONS:    There were no immediate complications.  FINDINGS:  LEFT VENTRICLE: EF = 60-65%. No regional wall motion abnormalities.  RIGHT VENTRICLE: Normal size and function.   LEFT ATRIUM: No thrombus/mass.  LEFT ATRIAL APPENDAGE: No thrombus/mass.   RIGHT ATRIUM: No thrombus/mass.  AORTIC VALVE:  Trileaflet. No regurgitation. No vegetation.  MITRAL VALVE:    There is a very small, mobile echodensity on the anterior leaflet of the mitral valve consistent with vegetation (see images 10, 29, 35). Trivial MR  TRICUSPID VALVE: Normal structure. Trivial regurgitation. No vegetation.  PULMONIC VALVE: Grossly normal structure. Trivial regurgitation. No apparent vegetation.  INTERATRIAL SEPTUM: No PFO or ASD seen by color Doppler. Agitated saline study shows very small amount of bubbles in LA after 5 cardiac cycles, consistent with small intra-atrial shunt  PERICARDIUM: No effusion noted.  DESCENDING AORTA: Minimal plaque seen   CONCLUSION: Small vegetation on anterior leaflet of mitral valve, consistent with endocarditis.  Findings communicated to primary and ID teams.   Shelda Bruckner, MD, PhD, [facility] [place]  [facility]  [place]  [facility] at [facility] at [facility] [address] [place], [place] [address] [phone]   1:27 PM

## 2024-01-01 NOTE — Progress Notes (Addendum)
 Progress Note   Patient: Tracy Baker FMW:[id number] DOB: [birth year] DOA: 12/28/2023     1 DOS: the patient was seen and examined on 01/01/2024   Brief hospital course: 68 y.o. F with history nephrolithiasis otherwise healthy, presents by private vehicle after leaving [facility] in [facility] where she was being treated for C. albicans candidemia.  First underwent ureteral stent placement and lithotripsy at [facility] 10/10, then stent removal 10/14.  After stent removal, she developed fever, chills, malaise.  She went to [place] on vacation, where she got worse and presented to the ER at [facility].  There, CT showed stranding around the right kidney and ureter.  Urology was consulted and she was admitted on Rocephin but urine and blood cultures subsequently turned positive for candida albicans. ID were consulted.   Treated with fluconazole.  TTE normal.    Per d/c summary (results not in careEveywhere that I can see): 10/18 urine culture: Yeast 10/18 blood culture: Candida albicans in 1/2 10/20 blood culture: Budding yeast 10/22 blood culture: results unavailable  On hospital day 7, the patient requested to leave AMA and drive herself to [facility], as she didn't understand the medical plan or plans for discharge, she wished to be close to home, and efforts to transfer directly from [facility] were unsuccessful.  In the ER here, vitals WNL.  Blood cultures obtained and she was given Diflucan and the hospitalist service was asked to evaluate.  Assessment and Plan: * C. albicans candidemia (HCC) Fungal endocarditis Treatment already initiated in [place].  Repeat cultures on 10/20 still had candida present.  Repeat from 10/22 not available.  TTE reportedly negative. TEE positive for small vegetation on anterior leaflet of mitral valve, consistent with endocarditis. Ophthalmology performed fundoscopy exam, no fungal ophthalmitis. Repeat cultures so far negative, urine cultures no growth. Follow ID  recommendations. Continue Micafungin.  Transaminitis Alk phos seems chronically elevated, around 300, AST/ ALT improved. Abnormal LFT presumably EtOH or ASLD. Trend LFTs. Avoid nephrotoxic drugs. Follow up with PCP  Hypocalcemia Continue oscal.  Hypokalemia Discussed about low K, wants to avoid supplements, states she consumes bananas. Follow daily electrolytes.    Out of bed to chair. Incentive spirometry. Nursing supportive care. Fall, aspiration precautions. Diet:  Diet Orders (From admission, onward)     Start     Ordered   01/01/24 1227  Diet NPO time specified  Diet effective now       Comments: Patient to remain NPO until fully awake following the TEE.   01/01/24 1226           DVT prophylaxis: enoxaparin (LOVENOX) injection 40 mg Start: 12/29/23 1600  Level of care: Med-Surg   Code Status: Full Code  Subjective: Patient is seen and examined today morning. She denies any complaints, awaiting TEE scheduled for today.  Physical Exam: Vitals:   01/01/24 1249 01/01/24 1333 01/01/24 1335 01/01/24 1340  BP: (!) 172/69 122/66 133/62 (!) 141/69  Pulse: 65 63 66 61  Resp: (!) 21 20 16  (!) 22  Temp: 97.8 F (36.6 C) (!) 97.4 F (36.3 C)    TempSrc: Temporal Temporal    SpO2: 96% 95% 93% 97%  Weight:      Height:        General - Elderly Caucasian female, no apparent distress HEENT - PERRLA, EOMI, atraumatic head, non tender sinuses. Lung - Clear, diffuse rhonchi, no wheezes. Heart - S1, S2 heard, no murmurs, rubs, trace pedal edema. Abdomen - Soft, non tender, bowel sounds good Neuro -  Alert, awake and oriented x 3, non focal exam. Skin - Warm and dry.  Data Reviewed:      Latest Ref Rng & Units 01/01/2024    4:46 AM 12/31/2023    5:01 AM 12/30/2023    3:26 AM  CBC  WBC 4.0 - 10.5 K/uL 7.4  6.1  6.8   Hemoglobin 12.0 - 15.0 g/dL 89.2  89.6  89.5   Hematocrit 36.0 - 46.0 % 31.4  31.7  31.2   Platelets 150 - 400 K/uL 335  323  306       Latest  Ref Rng & Units 01/01/2024    4:46 AM 12/31/2023    5:01 AM 12/30/2023    3:26 AM  BMP  Glucose 70 - 99 mg/dL 884  899  888   BUN 8 - 23 mg/dL 10  9  10    Creatinine 0.44 - 1.00 mg/dL 9.35  9.35  9.41   Sodium 135 - 145 mmol/L 137  138  139   Potassium 3.5 - 5.1 mmol/L 3.3  3.4  3.2   Chloride 98 - 111 mmol/L 103  103  100   CO2 22 - 32 mmol/L 25  23  23    Calcium 8.9 - 10.3 mg/dL 8.4  8.3  8.1    EP STUDY Result Date: 01/01/2024 See surgical note for result.   Family Communication: Discussed with patient, understand and agree. All questions answered.  Disposition: Status is: Inpatient Remains inpatient appropriate because: IV antifungal, positive TEE, ID follow up.  Planned Discharge Destination: Home with Home Health     Time spent: 45 minutes  Author: Concepcion Riser, MD 01/01/2024 1:46 PM Secure chat 7am to 7pm For on call review [email or web address].

## 2024-01-01 NOTE — Consult Note (Addendum)
 [address]       [place] [address]             [phone]        [name] [facility] Medical Record #[id number] Date of Birth: 02/07/1956  Referring: Tracy Pore, MD  Primary Care: Tracy Tinnie POUR, PA-C Primary Cardiologist:None  Reason for Consult: Evaluation for potential surgical management of suspected mitral valve endocarditis   History of Present Illness: [name] is a 68 year old female with no significant past medical history prior to events this month.  She underwent lithotripsy and placement of a right ureteral stent on 12/14/2023 at [facility].  She subsequently had the stent removed 4 days later.  Shortly after that, she developed fever, chills, and malaise but went on to awaiting a fit in [place].  While there, her symptoms worsened prompting visit to the emergency room at [facility]  on 12/22/2023.  A CT scan obtained at that facility demonstrated tissue stranding around the right kidney and right ureter.  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  She was started on Diflucan and treated there for several days.  She decided she wanted to complete treatment in [facility] so left the [facility] AGAINST MEDICAL ADVICE on 12/29/2023 and drove herself to [place].  She was seen in the emergency room here at [facility] and admitted by the hospitalist service. IV Diflucan was resumed.  Repeat blood and urine cultures obtained here are negative to date.  She may have by the infectious disease service and switched from fluconazole to micafungin.  An ophthalmology consult was provided by Dr. Zerita Baker who conducted a funduscopic exam with dilation showing no evidence of fungal endophthalmitis. Transesophageal echo obtained today showing normal biventricular function.  The report states there was a very small, mobile echodensity on the anterior leaflet of the  mitral valve consistent with vegetation  with trivial MR.  There are no dimensions of the lesion given in the report but it appears to be about 4 mm in diameter.   [name] is currently resting in bed with her daughter at the bedside.  She is awake and alert and denies having any discomfort although she admitted to feeling anxious.  She continues to work a couple days a week.  She thinks her last dental visit was over a year ago and said she did not have any current dental concerns.   Current Activity/ Functional Status: Patient is independent with mobility/ambulation, transfers, ADL's, IADL's.   Zubrod Score: At the time of surgery this patient's most appropriate activity status/level should be described as: [x]     0    Normal activity, no symptoms []     1    Restricted in physical strenuous activity but ambulatory, able to do out light work []     2    Ambulatory and capable of self care, unable to do work activities, up and about                 more than 50%  Of the time                            []     3    Only limited self care, in bed greater than 50% of waking hours []     4    Completely disabled, no self care, confined to bed or  chair []     5    Moribund  Past Medical History:  Diagnosis Date   Thyroid  disease     Past Surgical History:  Procedure Laterality Date   APPENDECTOMY     AUGMENTATION MAMMAPLASTY Bilateral 1981   removed in 2006   BREAST IMPLANT REMOVAL Bilateral 2007   CHOLECYSTECTOMY  2010   COLONOSCOPY WITH PROPOFOL  N/A 06/30/2021   Procedure: COLONOSCOPY WITH PROPOFOL ;  Surgeon: Tracy Carmine, MD;  Location: [facility] ENDOSCOPY;  Service: Endoscopy;  Laterality: N/A;   LAPAROSCOPIC TOTAL HYSTERECTOMY  2006    Social History   Tobacco Use  Smoking Status Every Day   Current packs/day: 1.00   Types: Cigarettes  Smokeless Tobacco Never  Tobacco Comments   About 15-20 cigarettes a day    Social History   Substance and Sexual Activity  Alcohol Use Yes      Allergies  Allergen Reactions   Codeine Anaphylaxis and Nausea And Vomiting   Red Dye #40 (Allura Red) Nausea Only    Current Facility-Administered Medications  Medication Dose Route Frequency Provider Last Rate Last Admin   acetaminophen (TYLENOL) tablet 650 mg  650 mg Oral Q6H PRN Tracy Slain, MD   650 mg at 12/30/23 0542   Or   acetaminophen (TYLENOL) suppository 650 mg  650 mg Rectal Q6H PRN Tracy Slain, MD       calcium carbonate (OS-CAL - dosed in mg of elemental calcium) tablet 1,250 mg  1 tablet Oral Q breakfast Tracy Slain, MD   1,250 mg at 12/31/23 1016   enoxaparin (LOVENOX) injection 40 mg  40 mg Subcutaneous Q24H Tracy Slain, MD   40 mg at 12/30/23 1536   micafungin (MYCAMINE) 150 mg in sodium chloride  0.9 % 100 mL IVPB  150 mg Intravenous Q24H Tracy Slain, MD   Stopped at 01/01/24 1242   ondansetron  (ZOFRAN ) tablet 4 mg  4 mg Oral Q8H PRN Tracy Slain, MD       pantoprazole  (PROTONIX ) EC tablet 20 mg  20 mg Oral Daily Tracy Slain, MD   20 mg at 12/31/23 1019   potassium chloride SA (KLOR-CON M) CR tablet 40 mEq  40 mEq Oral Daily Tracy Slain, MD   40 mEq at 12/30/23 1024    Medications Prior to Admission  Medication Sig Dispense Refill Last Dose/Taking   acetaminophen (TYLENOL) 325 MG tablet Take 650 mg by mouth daily as needed for fever.   12/27/2023   clonazePAM  (KLONOPIN ) 0.5 MG tablet Take 0.25-0.5 mg by mouth at bedtime as needed for anxiety.   12/28/2023 Bedtime   [EXPIRED] fluconazole (DIFLUCAN) 200 MG tablet Take 2 tablets by mouth daily for 3 days. Please present to hospital in [place]  for continued treatment. In the meantime, take 2 tablets by mouth daily for 3 days.   12/28/2023 Morning   ondansetron  (ZOFRAN ) 4 MG tablet Take 1 tablet (4 mg total) by mouth every 8 (eight) hours as needed for nausea or vomiting. 20 tablet 0 Past Week   pantoprazole  (PROTONIX ) 20 MG  tablet Take 1 tablet (20 mg total) by mouth daily. 30 tablet 2 Past Month   lovastatin  (MEVACOR ) 20 MG tablet Take 1 tablet (20 mg total) by mouth at bedtime. (Patient not taking: Reported on 12/29/2023) 90 tablet 3 Not Taking    Family History  Problem Relation Age of Onset   Hypertension Mother    Diabetes Father    Breast cancer Neg Hx      Review of Systems:  Cardiac Review of Systems: Y or  [    ]= no  Chest Pain [    ]  Resting SOB [   ] Exertional SOB  [  ]  Orthopnea [  ]   Pedal Edema [   ]    Palpitations [  ] Syncope  [  ]   Presyncope [   ]  General Review of Systems: [Y] = yes [  ]=no Constitional: recent weight change [  ]; anorexia [  ]; fatigue [  ]; nausea [  ]; night sweats [  ]; fever [  ]; or chills [  ]                                                               Dental: Last Dentist visit: Over 1 year ago  Eye : blurred vision [  ]; diplopia [   ]; vision changes [  ];  Amaurosis fugax[  ]; Resp: cough [  ];  wheezing[  ];  hemoptysis[  ]; shortness of breath[  ]; paroxysmal nocturnal dyspnea[  ]; dyspnea on exertion[  ]; or orthopnea[  ];  GI:  gallstones[  ], vomiting[  ];  dysphagia[  ]; melena[  ];  hematochezia [  ]; heartburn[  ];   Hx of  Colonoscopy[  ]; GU: kidney stones [  ]; hematuria[  ];   dysuria [  ];  nocturia[  ];  history of     obstruction [  ]; urinary frequency [  ]             Skin: rash, swelling[  ];, hair loss[  ];  peripheral edema[  ];  or itching[  ]; Musculosketetal: myalgias[  ];  joint swelling[  ];  joint erythema[  ];  joint pain[  ];  back pain[  ];  Heme/Lymph: bruising[  ];  bleeding[  ];  anemia[  ];  Neuro: TIA[  ];  headaches[  ];  stroke[  ];  vertigo[  ];  seizures[  ];   paresthesias[  ];  difficulty walking[  ];  Psych:depression[  ]; anxiety[  ];  Endocrine: diabetes[  ];  thyroid  dysfunction[  ];           Physical Exam: BP (!) 144/65   Pulse 62   Temp (!) 97.4 F (36.3 C) (Temporal)   Resp 16   Ht 5' 4  (1.626 m)   Wt 69.9 kg Comment: Wt from 12/22/2023  SpO2 92%   BMI 26.45 kg/m    General appearance: alert, cooperative, appears stated age, and no distress.  Teeth appear to be in generally poor repair. Head: Normocephalic, without obvious abnormality, atraumatic Neck: no adenopathy, no carotid bruit, no JVD, and supple, symmetrical, trachea midline Lymph nodes: No cervical or clavicular adenopathy Resp: Normal respiratory effort at rest.  Breath sounds are full, equal, and clear to auscultation Cardio: Regular rate and rhythm, no murmur GI: Active bowel sounds, no mass, no tenderness Extremities: No deformities.  Has some bruising in the upper extremities from old IV sites.  No inflammation.  All distal extremity pulses are palpable Neurologic: Grossly normal  Diagnostic Studies & Laboratory data:     Recent Radiology Findings:   TRANSESOPHAGEAL ECHOCARDIOGRAM  NAME:  Tracy Baker                                    MRN:   [id number] DOB:  [birth year]                                 ADMIT DATE: 12/28/2023   INDICATIONS: Candidemia   PROCEDURE:    Informed consent was obtained prior to the procedure. The risks, benefits and alternatives for the procedure were discussed and the patient comprehended these risks.  Risks include, but are not limited to, cough, sore throat, vomiting, nausea, somnolence, esophageal and stomach trauma or perforation, bleeding, low blood pressure, aspiration, pneumonia, infection, trauma to the teeth and death.     Procedural time out performed. Patient received monitored anesthesia care under the supervision of Dr. Epifanio. Patient received a total of 270 mg propofol  during the procedure.   The transesophageal probe was inserted in the esophagus and stomach without difficulty and multiple views were obtained.      COMPLICATIONS:     There were no immediate complications.   FINDINGS:   LEFT VENTRICLE: EF = 60-65%. No regional wall motion  abnormalities.  RIGHT VENTRICLE: Normal size and function.    LEFT ATRIUM: No thrombus/mass.   LEFT ATRIAL APPENDAGE: No thrombus/mass.    RIGHT ATRIUM: No thrombus/mass.   AORTIC VALVE:  Trileaflet. No regurgitation. No vegetation.   MITRAL VALVE:    There is a very small, mobile echodensity on the anterior leaflet of the mitral valve consistent with vegetation (see images 10, 29, 35). Trivial MR   TRICUSPID VALVE: Normal structure. Trivial regurgitation. No vegetation.   PULMONIC VALVE: Grossly normal structure. Trivial regurgitation. No apparent vegetation.   INTERATRIAL SEPTUM: No PFO or ASD seen by color Doppler. Agitated saline study shows very small amount of bubbles in LA after 5 cardiac cycles, consistent with small intra-atrial shunt   PERICARDIUM: No effusion noted.   DESCENDING AORTA: Minimal plaque seen     CONCLUSION: Small vegetation on anterior leaflet of mitral valve, consistent with endocarditis. Findings communicated to primary and ID teams.     Shelda Bruckner, MD, PhD, [facility]  [facility]  [place]  Heart & Vascular at [facility] at [facility] [address], [address] [place], [place] [address] [phone]     1:27 PM    I have independently reviewed the above radiologic studies and discussed with the patient   Recent Lab Findings: Lab Results  Component Value Date   WBC 7.4 01/01/2024   HGB 10.7 (L) 01/01/2024   HCT 31.4 (L) 01/01/2024   PLT 335 01/01/2024   GLUCOSE 115 (H) 01/01/2024   CHOL 240 (H) 06/13/2023   TRIG 109 06/13/2023   HDL 51 06/13/2023   LDLCALC 170 (H) 06/13/2023   ALT 29 01/01/2024   AST 12 (L) 01/01/2024   NA 137 01/01/2024   K 3.3 (L) 01/01/2024   CL 103 01/01/2024   CREATININE 0.64 01/01/2024   BUN 10 01/01/2024   CO2 25 01/01/2024   TSH 3.190 06/13/2023   HGBA1C 5.8 (H) 06/13/2023      Assessment / Plan:      - 68 year old female with no significant past  medical history who had lithotripsy and right ureteral stent placement on 12/14/2023 at [facility].  The ureteral stent was removed about 4 days later then about 4 days after that, she developed fever, chills, and malaise.  She was in [place]  at a wedding at the time so she was seen at the [facility] emergency department and eventually admitted.  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  Treatment with Diflucan was initiated.  She improved symptomatically.  She chose to leave [facility] and return to [facility] for further treatment.  Blood and urine cultures obtained here at [facility] 4 days ago have remained negative and she has remained afebrile.  Additional workup has included a funduscopic exam by an ophthalmologist that was negative for any ophthalmic Candida infection.  She had a TEE today showing a very small mobile vegetation on the anterior leaflet concerning for endocarditis.  There is trace mitral insufficiency and all other valves are normal in structure and function.  She is hemodynamically stable. There is no indication for immediate surgical intervention.  Dr. Kerrin will see Ms. Strzelecki and determine if there is any role for surgery in the future.   - Transaminitis-noted on admission labs: AST and ALT have improved since admission.  This is being followed by the primary team.  - Poor dentition: If surgery is anticipated, she will need a full dental evaluation and likely require extractions.   I  spent 25 minutes counseling the patient face to face.   Laurel JUDITHANN Becket, PA-C  01/01/2024 3:55 PM  Patient seen and examined, records and echo images reviewed.  68 yo woman with no prior cardiac history. presented with fever and chills 2 weeks after undergoing lithotripsy and right ureteral stent.  Was admitted at [facility] with blood and urine cultures growing Candida albicans.  Treated with Diflucan.  She left AMA and came here to be closer  to home.  Grew yeast form 1 of 2 culture bottles on 2 separate occasions.   TEE today showed a very small echo density on anterior leaflet of mitral valve (about 4 mm) concerning for a vegetation.  Trivial MR.  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  This case is very unusual in that there is only a minimal vegetation whereas usually there is significant tissue destruction.    Needs Cardiology consult and will need coronaries assessed.  Elspeth MOTE Kerrin, MD [facility] [phone]

## 2024-01-01 NOTE — H&P (View-Only) (Signed)
 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 is a 68 year old female with no significant past medical history prior to events this month.  She underwent lithotripsy and placement of a right ureteral stent on 12/14/2023 at [facility].  She subsequently had the stent removed 4 days later.  Shortly after that, she developed fever, chills, and malaise but went on to awaiting a fit in [place].  While there, her symptoms worsened prompting visit to the emergency room at [facility]  on 12/22/2023.  A CT scan obtained at that facility demonstrated tissue stranding around the right kidney and right ureter.  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  She was started on Diflucan and treated there for several days.  She decided she wanted to complete treatment in [facility] so left the [facility] AGAINST MEDICAL ADVICE on 12/29/2023 and drove herself to [place].  She was seen in the emergency room here at [facility] and admitted by the hospitalist service. IV Diflucan was resumed.  Repeat blood and urine cultures obtained here are negative to date.  She may have by the infectious disease service and switched from fluconazole to micafungin.  An ophthalmology consult was provided by Dr. Zerita Salinas who conducted a funduscopic exam with dilation showing no evidence of fungal endophthalmitis. Transesophageal echo obtained today showing normal biventricular function.  The report states there was a very small, mobile echodensity on the anterior leaflet of the  mitral valve consistent with vegetation  with trivial MR.  There are no dimensions of the lesion given in the report but it appears to be about 4 mm in diameter.   [name] is currently resting in bed with her daughter at the bedside.  She is awake and alert and denies having any discomfort although she admitted to feeling anxious.  She continues to work a couple days a week.  She thinks her last dental visit was over a year ago and said she did not have any current dental concerns.   Current Activity/ Functional Status: Patient is independent with mobility/ambulation, transfers, ADL's, IADL's.   Zubrod Score: At the time of surgery this patient's most appropriate activity status/level should be described as: [x]     0    Normal activity, no symptoms []     1    Restricted in physical strenuous activity but ambulatory, able to do out light work []     2    Ambulatory and capable of self care, unable to do work activities, up and about                 more than 50%  Of the time                            []     3    Only limited self care, in bed greater than 50% of waking hours []     4    Completely disabled, no self care, confined to bed or  chair []     5    Moribund  Past Medical History:  Diagnosis Date   Thyroid  disease     Past Surgical History:  Procedure Laterality Date   APPENDECTOMY     AUGMENTATION MAMMAPLASTY Bilateral 1981   removed in 2006   BREAST IMPLANT REMOVAL Bilateral 2007   CHOLECYSTECTOMY  2010   COLONOSCOPY WITH PROPOFOL  N/A 06/30/2021   Procedure: COLONOSCOPY WITH PROPOFOL ;  Surgeon: Jinny Carmine, MD;  Location: [facility] ENDOSCOPY;  Service: Endoscopy;  Laterality: N/A;   LAPAROSCOPIC TOTAL HYSTERECTOMY  2006    Social History   Tobacco Use  Smoking Status Every Day   Current packs/day: 1.00   Types: Cigarettes  Smokeless Tobacco Never  Tobacco Comments   About 15-20 cigarettes a day    Social History   Substance and Sexual Activity  Alcohol Use Yes      Allergies  Allergen Reactions   Codeine Anaphylaxis and Nausea And Vomiting   Red Dye #40 (Allura Red) Nausea Only    Current Facility-Administered Medications  Medication Dose Route Frequency Provider Last Rate Last Admin   acetaminophen (TYLENOL) tablet 650 mg  650 mg Oral Q6H PRN Lonni Slain, MD   650 mg at 12/30/23 0542   Or   acetaminophen (TYLENOL) suppository 650 mg  650 mg Rectal Q6H PRN Lonni Slain, MD       calcium carbonate (OS-CAL - dosed in mg of elemental calcium) tablet 1,250 mg  1 tablet Oral Q breakfast Lonni Slain, MD   1,250 mg at 12/31/23 1016   enoxaparin (LOVENOX) injection 40 mg  40 mg Subcutaneous Q24H Lonni Slain, MD   40 mg at 12/30/23 1536   micafungin (MYCAMINE) 150 mg in sodium chloride  0.9 % 100 mL IVPB  150 mg Intravenous Q24H Lonni Slain, MD   Stopped at 01/01/24 1242   ondansetron  (ZOFRAN ) tablet 4 mg  4 mg Oral Q8H PRN Lonni Slain, MD       pantoprazole  (PROTONIX ) EC tablet 20 mg  20 mg Oral Daily Lonni Slain, MD   20 mg at 12/31/23 1019   potassium chloride SA (KLOR-CON M) CR tablet 40 mEq  40 mEq Oral Daily Lonni Slain, MD   40 mEq at 12/30/23 1024    Medications Prior to Admission  Medication Sig Dispense Refill Last Dose/Taking   acetaminophen (TYLENOL) 325 MG tablet Take 650 mg by mouth daily as needed for fever.   12/27/2023   clonazePAM  (KLONOPIN ) 0.5 MG tablet Take 0.25-0.5 mg by mouth at bedtime as needed for anxiety.   12/28/2023 Bedtime   [EXPIRED] fluconazole (DIFLUCAN) 200 MG tablet Take 2 tablets by mouth daily for 3 days. Please present to hospital in [place]  for continued treatment. In the meantime, take 2 tablets by mouth daily for 3 days.   12/28/2023 Morning   ondansetron  (ZOFRAN ) 4 MG tablet Take 1 tablet (4 mg total) by mouth every 8 (eight) hours as needed for nausea or vomiting. 20 tablet 0 Past Week   pantoprazole  (PROTONIX ) 20 MG  tablet Take 1 tablet (20 mg total) by mouth daily. 30 tablet 2 Past Month   lovastatin  (MEVACOR ) 20 MG tablet Take 1 tablet (20 mg total) by mouth at bedtime. (Patient not taking: Reported on 12/29/2023) 90 tablet 3 Not Taking    Family History  Problem Relation Age of Onset   Hypertension Mother    Diabetes Father    Breast cancer Neg Hx      Review of Systems:  Cardiac Review of Systems: Y or  [    ]= no  Chest Pain [    ]  Resting SOB [   ] Exertional SOB  [  ]  Orthopnea [  ]   Pedal Edema [   ]    Palpitations [  ] Syncope  [  ]   Presyncope [   ]  General Review of Systems: [Y] = yes [  ]=no Constitional: recent weight change [  ]; anorexia [  ]; fatigue [  ]; nausea [  ]; night sweats [  ]; fever [  ]; or chills [  ]                                                               Dental: Last Dentist visit: Over 1 year ago  Eye : blurred vision [  ]; diplopia [   ]; vision changes [  ];  Amaurosis fugax[  ]; Resp: cough [  ];  wheezing[  ];  hemoptysis[  ]; shortness of breath[  ]; paroxysmal nocturnal dyspnea[  ]; dyspnea on exertion[  ]; or orthopnea[  ];  GI:  gallstones[  ], vomiting[  ];  dysphagia[  ]; melena[  ];  hematochezia [  ]; heartburn[  ];   Hx of  Colonoscopy[  ]; GU: kidney stones [  ]; hematuria[  ];   dysuria [  ];  nocturia[  ];  history of     obstruction [  ]; urinary frequency [  ]             Skin: rash, swelling[  ];, hair loss[  ];  peripheral edema[  ];  or itching[  ]; Musculosketetal: myalgias[  ];  joint swelling[  ];  joint erythema[  ];  joint pain[  ];  back pain[  ];  Heme/Lymph: bruising[  ];  bleeding[  ];  anemia[  ];  Neuro: TIA[  ];  headaches[  ];  stroke[  ];  vertigo[  ];  seizures[  ];   paresthesias[  ];  difficulty walking[  ];  Psych:depression[  ]; anxiety[  ];  Endocrine: diabetes[  ];  thyroid  dysfunction[  ];           Physical Exam: BP (!) 144/65   Pulse 62   Temp (!) 97.4 F (36.3 C) (Temporal)   Resp 16   Ht 5' 4  (1.626 m)   Wt 69.9 kg Comment: Wt from 12/22/2023  SpO2 92%   BMI 26.45 kg/m    General appearance: alert, cooperative, appears stated age, and no distress.  Teeth appear to be in generally poor repair. Head: Normocephalic, without obvious abnormality, atraumatic Neck: no adenopathy, no carotid bruit, no JVD, and supple, symmetrical, trachea midline Lymph nodes: No cervical or clavicular adenopathy Resp: Normal respiratory effort at rest.  Breath sounds are full, equal, and clear to auscultation Cardio: Regular rate and rhythm, no murmur GI: Active bowel sounds, no mass, no tenderness Extremities: No deformities.  Has some bruising in the upper extremities from old IV sites.  No inflammation.  All distal extremity pulses are palpable Neurologic: Grossly normal  Diagnostic Studies & Laboratory data:     Recent Radiology Findings:   TRANSESOPHAGEAL ECHOCARDIOGRAM  NAME:  Tracy Baker                                    MRN:   [id number] DOB:  [birth year]                                 ADMIT DATE: 12/28/2023   INDICATIONS: Candidemia   PROCEDURE:    Informed consent was obtained prior to the procedure. The risks, benefits and alternatives for the procedure were discussed and the patient comprehended these risks.  Risks include, but are not limited to, cough, sore throat, vomiting, nausea, somnolence, esophageal and stomach trauma or perforation, bleeding, low blood pressure, aspiration, pneumonia, infection, trauma to the teeth and death.     Procedural time out performed. Patient received monitored anesthesia care under the supervision of Dr. Epifanio. Patient received a total of 270 mg propofol  during the procedure.   The transesophageal probe was inserted in the esophagus and stomach without difficulty and multiple views were obtained.      COMPLICATIONS:     There were no immediate complications.   FINDINGS:   LEFT VENTRICLE: EF = 60-65%. No regional wall motion  abnormalities.  RIGHT VENTRICLE: Normal size and function.    LEFT ATRIUM: No thrombus/mass.   LEFT ATRIAL APPENDAGE: No thrombus/mass.    RIGHT ATRIUM: No thrombus/mass.   AORTIC VALVE:  Trileaflet. No regurgitation. No vegetation.   MITRAL VALVE:    There is a very small, mobile echodensity on the anterior leaflet of the mitral valve consistent with vegetation (see images 10, 29, 35). Trivial MR   TRICUSPID VALVE: Normal structure. Trivial regurgitation. No vegetation.   PULMONIC VALVE: Grossly normal structure. Trivial regurgitation. No apparent vegetation.   INTERATRIAL SEPTUM: No PFO or ASD seen by color Doppler. Agitated saline study shows very small amount of bubbles in LA after 5 cardiac cycles, consistent with small intra-atrial shunt   PERICARDIUM: No effusion noted.   DESCENDING AORTA: Minimal plaque seen     CONCLUSION: Small vegetation on anterior leaflet of mitral valve, consistent with endocarditis. Findings communicated to primary and ID teams.     Shelda Bruckner, MD, PhD, [facility]  [facility]  [place]  [facility] & Vascular at [facility] at [facility] [address], [address] [place], [place] [address] [phone]     1:27 PM    I have independently reviewed the above radiologic studies and discussed with the patient   Recent Lab Findings: Lab Results  Component Value Date   WBC 7.4 01/01/2024   HGB 10.7 (L) 01/01/2024   HCT 31.4 (L) 01/01/2024   PLT 335 01/01/2024   GLUCOSE 115 (H) 01/01/2024   CHOL 240 (H) 06/13/2023   TRIG 109 06/13/2023   HDL 51 06/13/2023   LDLCALC 170 (H) 06/13/2023   ALT 29 01/01/2024   AST 12 (L) 01/01/2024   NA 137 01/01/2024   K 3.3 (L) 01/01/2024   CL 103 01/01/2024   CREATININE 0.64 01/01/2024   BUN 10 01/01/2024   CO2 25 01/01/2024   TSH 3.190 06/13/2023   HGBA1C 5.8 (H) 06/13/2023      Assessment / Plan:      - 68 year old female with no significant past  medical history who had lithotripsy and right ureteral stent placement on 12/14/2023 at [facility].  The ureteral stent was removed about 4 days later then about 4 days after that, she developed fever, chills, and malaise.  She was in [place]  at a wedding at the time so she was seen at the [facility] emergency department and eventually admitted.  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  Treatment with Diflucan was initiated.  She improved symptomatically.  She chose to leave [facility] and return to [facility] for further treatment.  Blood and urine cultures obtained here at [facility] 4 days ago have remained negative and she has remained afebrile.  Additional workup has included a funduscopic exam by an ophthalmologist that was negative for any ophthalmic Candida infection.  She had a TEE today showing a very small mobile vegetation on the anterior leaflet concerning for endocarditis.  There is trace mitral insufficiency and all other valves are normal in structure and function.  She is hemodynamically stable. There is no indication for immediate surgical intervention.  Dr. Kerrin will see Ms. Strzelecki and determine if there is any role for surgery in the future.   - Transaminitis-noted on admission labs: AST and ALT have improved since admission.  This is being followed by the primary team.  - Poor dentition: If surgery is anticipated, she will need a full dental evaluation and likely require extractions.   I  spent 25 minutes counseling the patient face to face.   Laurel JUDITHANN Becket, PA-C  01/01/2024 3:55 PM  Patient seen and examined, records and echo images reviewed.  68 yo woman with no prior cardiac history. presented with fever and chills 2 weeks after undergoing lithotripsy and right ureteral stent.  Was admitted at [facility] with blood and urine cultures growing Candida albicans.  Treated with Diflucan.  She left [facility] and came here to be closer  to home.  Grew yeast form 1 of 2 culture bottles on 2 separate occasions.   TEE today showed a very small echo density on anterior leaflet of mitral valve (about 4 mm) concerning for a vegetation.  Trivial MR.  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  This case is very unusual in that there is only a minimal vegetation whereas usually there is significant tissue destruction.    Needs Cardiology consult and will need coronaries assessed.  Elspeth MOTE Kerrin, MD [facility] [phone]

## 2024-01-01 NOTE — Progress Notes (Signed)
  Echocardiogram Echocardiogram Transesophageal has been performed.  Tracy Baker, RDCS 01/01/2024, 1:37 PM

## 2024-01-02 ENCOUNTER — Observation Stay (HOSPITAL_COMMUNITY)

## 2024-01-02 ENCOUNTER — Inpatient Hospital Stay (HOSPITAL_COMMUNITY)

## 2024-01-02 ENCOUNTER — Observation Stay (HOSPITAL_COMMUNITY)
Admit: 2024-01-02 | Discharge: 2024-01-02 | Disposition: A | Attending: Thoracic Surgery (Cardiothoracic Vascular Surgery)

## 2024-01-02 DIAGNOSIS — I330 Acute and subacute infective endocarditis: Secondary | ICD-10-CM | POA: Diagnosis not present

## 2024-01-02 DIAGNOSIS — J1569 Pneumonia due to other gram-negative bacteria: Secondary | ICD-10-CM | POA: Diagnosis not present

## 2024-01-02 DIAGNOSIS — Z98818 Other dental procedure status: Secondary | ICD-10-CM | POA: Diagnosis not present

## 2024-01-02 DIAGNOSIS — I059 Rheumatic mitral valve disease, unspecified: Secondary | ICD-10-CM | POA: Diagnosis not present

## 2024-01-02 DIAGNOSIS — I340 Nonrheumatic mitral (valve) insufficiency: Secondary | ICD-10-CM | POA: Diagnosis not present

## 2024-01-02 DIAGNOSIS — R7303 Prediabetes: Secondary | ICD-10-CM | POA: Diagnosis not present

## 2024-01-02 DIAGNOSIS — R040 Epistaxis: Secondary | ICD-10-CM | POA: Diagnosis not present

## 2024-01-02 DIAGNOSIS — I130 Hypertensive heart and chronic kidney disease with heart failure and stage 1 through stage 4 chronic kidney disease, or unspecified chronic kidney disease: Secondary | ICD-10-CM | POA: Diagnosis present

## 2024-01-02 DIAGNOSIS — I339 Acute and subacute endocarditis, unspecified: Secondary | ICD-10-CM | POA: Diagnosis not present

## 2024-01-02 DIAGNOSIS — Y838 Other surgical procedures as the cause of abnormal reaction of the patient, or of later complication, without mention of misadventure at the time of the procedure: Secondary | ICD-10-CM | POA: Diagnosis not present

## 2024-01-02 DIAGNOSIS — K567 Ileus, unspecified: Secondary | ICD-10-CM | POA: Diagnosis not present

## 2024-01-02 DIAGNOSIS — M79642 Pain in left hand: Secondary | ICD-10-CM | POA: Diagnosis not present

## 2024-01-02 DIAGNOSIS — E43 Unspecified severe protein-calorie malnutrition: Secondary | ICD-10-CM | POA: Diagnosis not present

## 2024-01-02 DIAGNOSIS — I3489 Other nonrheumatic mitral valve disorders: Secondary | ICD-10-CM | POA: Diagnosis not present

## 2024-01-02 DIAGNOSIS — R5383 Other fatigue: Secondary | ICD-10-CM | POA: Diagnosis not present

## 2024-01-02 DIAGNOSIS — L89329 Pressure ulcer of left buttock, unspecified stage: Secondary | ICD-10-CM | POA: Diagnosis not present

## 2024-01-02 DIAGNOSIS — F17210 Nicotine dependence, cigarettes, uncomplicated: Secondary | ICD-10-CM | POA: Diagnosis not present

## 2024-01-02 DIAGNOSIS — Z7901 Long term (current) use of anticoagulants: Secondary | ICD-10-CM | POA: Diagnosis not present

## 2024-01-02 DIAGNOSIS — I50811 Acute right heart failure: Secondary | ICD-10-CM | POA: Diagnosis not present

## 2024-01-02 DIAGNOSIS — J90 Pleural effusion, not elsewhere classified: Secondary | ICD-10-CM | POA: Diagnosis not present

## 2024-01-02 DIAGNOSIS — B3741 Candidal cystitis and urethritis: Secondary | ICD-10-CM | POA: Diagnosis not present

## 2024-01-02 DIAGNOSIS — N183 Chronic kidney disease, stage 3 unspecified: Secondary | ICD-10-CM | POA: Diagnosis not present

## 2024-01-02 DIAGNOSIS — J9601 Acute respiratory failure with hypoxia: Secondary | ICD-10-CM | POA: Diagnosis not present

## 2024-01-02 DIAGNOSIS — L89310 Pressure ulcer of right buttock, unstageable: Secondary | ICD-10-CM | POA: Diagnosis not present

## 2024-01-02 DIAGNOSIS — B488 Other specified mycoses: Secondary | ICD-10-CM | POA: Diagnosis present

## 2024-01-02 DIAGNOSIS — Z792 Long term (current) use of antibiotics: Secondary | ICD-10-CM | POA: Diagnosis not present

## 2024-01-02 DIAGNOSIS — I4891 Unspecified atrial fibrillation: Secondary | ICD-10-CM | POA: Diagnosis not present

## 2024-01-02 DIAGNOSIS — G9349 Other encephalopathy: Secondary | ICD-10-CM | POA: Diagnosis not present

## 2024-01-02 DIAGNOSIS — N170 Acute kidney failure with tubular necrosis: Secondary | ICD-10-CM | POA: Diagnosis not present

## 2024-01-02 DIAGNOSIS — N390 Urinary tract infection, site not specified: Secondary | ICD-10-CM | POA: Diagnosis not present

## 2024-01-02 DIAGNOSIS — I50810 Right heart failure, unspecified: Secondary | ICD-10-CM | POA: Diagnosis not present

## 2024-01-02 DIAGNOSIS — B3789 Other sites of candidiasis: Secondary | ICD-10-CM | POA: Diagnosis not present

## 2024-01-02 DIAGNOSIS — Y95 Nosocomial condition: Secondary | ICD-10-CM | POA: Diagnosis not present

## 2024-01-02 DIAGNOSIS — A499 Bacterial infection, unspecified: Secondary | ICD-10-CM | POA: Diagnosis not present

## 2024-01-02 DIAGNOSIS — L89313 Pressure ulcer of right buttock, stage 3: Secondary | ICD-10-CM | POA: Diagnosis not present

## 2024-01-02 DIAGNOSIS — I4819 Other persistent atrial fibrillation: Secondary | ICD-10-CM | POA: Diagnosis not present

## 2024-01-02 DIAGNOSIS — Z952 Presence of prosthetic heart valve: Secondary | ICD-10-CM | POA: Diagnosis not present

## 2024-01-02 DIAGNOSIS — Z7982 Long term (current) use of aspirin: Secondary | ICD-10-CM | POA: Diagnosis not present

## 2024-01-02 DIAGNOSIS — K0889 Other specified disorders of teeth and supporting structures: Secondary | ICD-10-CM | POA: Diagnosis not present

## 2024-01-02 DIAGNOSIS — R609 Edema, unspecified: Secondary | ICD-10-CM | POA: Diagnosis not present

## 2024-01-02 DIAGNOSIS — R739 Hyperglycemia, unspecified: Secondary | ICD-10-CM | POA: Diagnosis not present

## 2024-01-02 DIAGNOSIS — I5021 Acute systolic (congestive) heart failure: Secondary | ICD-10-CM | POA: Diagnosis not present

## 2024-01-02 DIAGNOSIS — D638 Anemia in other chronic diseases classified elsewhere: Secondary | ICD-10-CM | POA: Diagnosis not present

## 2024-01-02 DIAGNOSIS — R7401 Elevation of levels of liver transaminase levels: Secondary | ICD-10-CM | POA: Diagnosis not present

## 2024-01-02 DIAGNOSIS — Z6831 Body mass index (BMI) 31.0-31.9, adult: Secondary | ICD-10-CM | POA: Diagnosis not present

## 2024-01-02 DIAGNOSIS — D631 Anemia in chronic kidney disease: Secondary | ICD-10-CM | POA: Diagnosis present

## 2024-01-02 DIAGNOSIS — K7200 Acute and subacute hepatic failure without coma: Secondary | ICD-10-CM | POA: Diagnosis present

## 2024-01-02 DIAGNOSIS — J690 Pneumonitis due to inhalation of food and vomit: Secondary | ICD-10-CM | POA: Diagnosis not present

## 2024-01-02 DIAGNOSIS — I509 Heart failure, unspecified: Secondary | ICD-10-CM | POA: Diagnosis not present

## 2024-01-02 DIAGNOSIS — I480 Paroxysmal atrial fibrillation: Secondary | ICD-10-CM | POA: Diagnosis not present

## 2024-01-02 DIAGNOSIS — E162 Hypoglycemia, unspecified: Secondary | ICD-10-CM | POA: Diagnosis not present

## 2024-01-02 DIAGNOSIS — B49 Unspecified mycosis: Secondary | ICD-10-CM | POA: Diagnosis not present

## 2024-01-02 DIAGNOSIS — N3000 Acute cystitis without hematuria: Secondary | ICD-10-CM | POA: Diagnosis not present

## 2024-01-02 DIAGNOSIS — B376 Candidal endocarditis: Secondary | ICD-10-CM | POA: Diagnosis present

## 2024-01-02 DIAGNOSIS — R5381 Other malaise: Secondary | ICD-10-CM | POA: Diagnosis not present

## 2024-01-02 DIAGNOSIS — D689 Coagulation defect, unspecified: Secondary | ICD-10-CM | POA: Diagnosis not present

## 2024-01-02 DIAGNOSIS — Y828 Other medical devices associated with adverse incidents: Secondary | ICD-10-CM | POA: Diagnosis present

## 2024-01-02 DIAGNOSIS — N1832 Chronic kidney disease, stage 3b: Secondary | ICD-10-CM | POA: Diagnosis present

## 2024-01-02 DIAGNOSIS — N1831 Chronic kidney disease, stage 3a: Secondary | ICD-10-CM | POA: Diagnosis not present

## 2024-01-02 DIAGNOSIS — N179 Acute kidney failure, unspecified: Secondary | ICD-10-CM | POA: Diagnosis not present

## 2024-01-02 DIAGNOSIS — I10 Essential (primary) hypertension: Secondary | ICD-10-CM | POA: Diagnosis not present

## 2024-01-02 DIAGNOSIS — Z9911 Dependence on respirator [ventilator] status: Secondary | ICD-10-CM | POA: Diagnosis not present

## 2024-01-02 DIAGNOSIS — J189 Pneumonia, unspecified organism: Secondary | ICD-10-CM | POA: Diagnosis not present

## 2024-01-02 DIAGNOSIS — L89319 Pressure ulcer of right buttock, unspecified stage: Secondary | ICD-10-CM | POA: Diagnosis not present

## 2024-01-02 DIAGNOSIS — E871 Hypo-osmolality and hyponatremia: Secondary | ICD-10-CM | POA: Diagnosis not present

## 2024-01-02 DIAGNOSIS — I38 Endocarditis, valve unspecified: Secondary | ICD-10-CM | POA: Diagnosis not present

## 2024-01-02 DIAGNOSIS — T8111XA Postprocedural  cardiogenic shock, initial encounter: Secondary | ICD-10-CM | POA: Diagnosis not present

## 2024-01-02 DIAGNOSIS — R578 Other shock: Secondary | ICD-10-CM | POA: Diagnosis not present

## 2024-01-02 DIAGNOSIS — N200 Calculus of kidney: Secondary | ICD-10-CM | POA: Diagnosis not present

## 2024-01-02 DIAGNOSIS — D696 Thrombocytopenia, unspecified: Secondary | ICD-10-CM | POA: Diagnosis present

## 2024-01-02 DIAGNOSIS — R570 Cardiogenic shock: Secondary | ICD-10-CM | POA: Diagnosis not present

## 2024-01-02 DIAGNOSIS — D62 Acute posthemorrhagic anemia: Secondary | ICD-10-CM | POA: Diagnosis not present

## 2024-01-02 DIAGNOSIS — R630 Anorexia: Secondary | ICD-10-CM | POA: Diagnosis not present

## 2024-01-02 DIAGNOSIS — Z9889 Other specified postprocedural states: Secondary | ICD-10-CM | POA: Diagnosis not present

## 2024-01-02 DIAGNOSIS — B377 Candidal sepsis: Secondary | ICD-10-CM | POA: Diagnosis not present

## 2024-01-02 DIAGNOSIS — D6959 Other secondary thrombocytopenia: Secondary | ICD-10-CM | POA: Diagnosis not present

## 2024-01-02 DIAGNOSIS — I959 Hypotension, unspecified: Secondary | ICD-10-CM | POA: Diagnosis not present

## 2024-01-02 DIAGNOSIS — E876 Hypokalemia: Secondary | ICD-10-CM | POA: Diagnosis not present

## 2024-01-02 DIAGNOSIS — Z992 Dependence on renal dialysis: Secondary | ICD-10-CM | POA: Diagnosis not present

## 2024-01-02 LAB — CULTURE, BLOOD (ROUTINE X 2)
Culture: NO GROWTH
Culture: NO GROWTH
Special Requests: ADEQUATE
Special Requests: ADEQUATE

## 2024-01-02 LAB — PULMONARY FUNCTION TEST
FEF 25-75 Pre: 2.05 L/s
FEF2575-%Pred-Pre: 102 %
FEV1-%Pred-Pre: 71 %
FEV1-Pre: 1.67 L
FEV1FVC-%Pred-Pre: 111 %
FEV6-%Pred-Pre: 66 %
FEV6-Pre: 1.96 L
FEV6FVC-%Pred-Pre: 104 %
FVC-%Pred-Pre: 63 %
FVC-Pre: 1.96 L
Pre FEV1/FVC ratio: 85 %
Pre FEV6/FVC Ratio: 100 %

## 2024-01-02 MED ORDER — CLONAZEPAM 0.5 MG PO TABS
0.5000 mg | ORAL_TABLET | Freq: Every evening | ORAL | Status: DC | PRN
  Administered 2024-01-02 – 2024-01-09 (×7): 0.5 mg via ORAL
  Filled 2024-01-02 (×7): qty 1

## 2024-01-02 NOTE — Progress Notes (Addendum)
 Subjective: No new complaints  Antibiotics:  Anti-infectives (From admission, onward)    Start     Dose/Rate Route Frequency Ordered Stop   12/29/23 1100  micafungin (MYCAMINE) 150 mg in sodium chloride  0.9 % 100 mL IVPB        150 mg 107.5 mL/hr over 1 Hours Intravenous Every 24 hours 12/29/23 1005     12/29/23 1000  fluconazole (DIFLUCAN) IVPB 400 mg  Status:  Discontinued        400 mg 100 mL/hr over 120 Minutes Intravenous Every 24 hours 12/29/23 0424 12/29/23 1005       Medications: Scheduled Meds:  calcium carbonate  1 tablet Oral Q breakfast   enoxaparin (LOVENOX) injection  40 mg Subcutaneous Q24H   pantoprazole   20 mg Oral Daily   potassium chloride  40 mEq Oral Daily   Continuous Infusions:  micafungin (MYCAMINE) 150 mg in sodium chloride  0.9 % 100 mL IVPB 150 mg (01/02/24 1203)   PRN Meds:.acetaminophen **OR** acetaminophen, clonazePAM , ondansetron     Objective: Weight change:  No intake or output data in the 24 hours ending 01/02/24 1408  Blood pressure 131/76, pulse 65, temperature 98.4 F (36.9 C), temperature source Oral, resp. rate 18, height 5' 4 (1.626 m), weight 69.9 kg, SpO2 95%. Temp:  [98 F (36.7 C)-98.6 F (37 C)] 98.4 F (36.9 C) (10/29 0800) Pulse Rate:  [65-72] 65 (10/29 0514) Resp:  [17-18] 18 (10/29 0800) BP: (131-148)/(64-76) 131/76 (10/29 0800) SpO2:  [93 %-97 %] 95 % (10/29 0800)  Physical Exam: Physical Exam Constitutional:      General: She is not in acute distress.    Appearance: She is well-developed. She is not diaphoretic.  HENT:     Head: Normocephalic and atraumatic.     Right Ear: External ear normal.     Left Ear: External ear normal.     Mouth/Throat:     Pharynx: No oropharyngeal exudate.  Eyes:     General: No scleral icterus.    Conjunctiva/sclera: Conjunctivae normal.     Pupils: Pupils are equal, round, and reactive to light.  Cardiovascular:     Rate and Rhythm: Normal rate and regular  rhythm.  Pulmonary:     Effort: Pulmonary effort is normal. No respiratory distress.     Breath sounds: No wheezing.  Abdominal:     General: There is no distension.     Palpations: Abdomen is soft.  Musculoskeletal:        General: No tenderness. Normal range of motion.  Lymphadenopathy:     Cervical: No cervical adenopathy.  Skin:    General: Skin is warm and dry.     Coloration: Skin is not pale.     Findings: No erythema or rash.  Neurological:     General: No focal deficit present.     Mental Status: She is alert and oriented to person, place, and time.     Motor: No abnormal muscle tone.     Coordination: Coordination normal.  Psychiatric:        Mood and Affect: Mood normal.        Behavior: Behavior normal.        Thought Content: Thought content normal.        Judgment: Judgment normal.      CBC:    BMET Recent Labs    12/31/23 0501 01/01/24 0446  NA 138 137  K 3.4* 3.3*  CL 103 103  CO2 23  25  GLUCOSE 100* 115*  BUN 9 10  CREATININE 0.64 0.64  CALCIUM 8.3* 8.4*     Liver Panel  Recent Labs    12/31/23 0501 01/01/24 0446  PROT 6.5 6.7  ALBUMIN 2.2* 2.2*  AST 14* 12*  ALT 35 29  ALKPHOS 305* 303*  BILITOT 0.8 0.8       Sedimentation Rate No results for input(s): ESRSEDRATE in the last 72 hours. C-Reactive Protein No results for input(s): CRP in the last 72 hours.  Micro Results: Recent Results (from the past 720 hours)  Blood culture (routine x 2)     Status: None   Collection Time: 12/28/23  6:57 PM   Specimen: BLOOD  Result Value Ref Range Status   Specimen Description BLOOD RIGHT ANTECUBITAL  Final   Special Requests   Final    BOTTLES DRAWN AEROBIC AND ANAEROBIC Blood Culture adequate volume   Culture   Final    NO GROWTH 5 DAYS Performed at [facility], [address], [place], [place] [address]    Report Status 01/02/2024 FINAL  Final  Blood culture (routine x 2)     Status: None   Collection Time: 12/28/23   7:06 PM   Specimen: BLOOD RIGHT HAND  Result Value Ref Range Status   Specimen Description BLOOD RIGHT HAND  Final   Special Requests   Final    BOTTLES DRAWN AEROBIC AND ANAEROBIC Blood Culture adequate volume   Culture   Final    NO GROWTH 5 DAYS Performed at [facility], [address] [place], [place] [address]    Report Status 01/02/2024 FINAL  Final  Urine Culture     Status: None   Collection Time: 12/29/23  8:37 AM   Specimen: Urine, Clean Catch  Result Value Ref Range Status   Specimen Description URINE, CLEAN CATCH  Final   Special Requests NONE  Final   Culture   Final    NO GROWTH Performed at [facility], [address], [place], [place] [address]    Report Status 12/30/2023 FINAL  Final    Studies/Results: DG Orthopantogram Result Date: 01/02/2024 EXAM: Tracy Baker FLORENCE VIEW 01/02/2024 09:38:00 AM TECHNIQUE: Panorex view of the mandible. COMPARISON: None available. CLINICAL HISTORY: At risk for dental caries [id number]. Pre cardiac surgery work-up. Poor dentition; Mitral valve. FINDINGS: DENTAL: Multiple missing mandibular and maxillary teeth. Multiple dental caries. BONES: No acute fracture or focal osseous lesion. TMJ: No dislocation. SOFT TISSUES: The soft tissues are unremarkable. IMPRESSION: 1. Multiple missing mandibular and maxillary teeth. 2. Multiple dental caries. Electronically signed by: Tracy Crease MD 01/02/2024 01:41 PM EDT RP Workstation: [id number]   ECHO TEE Result Date: 01/01/2024    TRANSESOPHOGEAL ECHO REPORT   Patient Name:   Tracy Baker Date of Exam: 01/01/2024 Medical Rec #:  [id number]      Height:       64.0 in Accession #:    [id number]     Weight:       154.1 lb Date of Birth:  Jul 10, 1955      BSA:          1.751 m Patient Age:    68 years       BP:           168/64 mmHg Patient Gender: F              HR:           80 bpm.  Exam Location:  Inpatient Procedure: Transesophageal Echo, Cardiac Doppler, Color Doppler,  Saline Contrast            Bubble Study and 3D Echo (Both Spectral and Color Flow Doppler were            utilized during procedure). Indications:     Endocarditis  History:         Patient has no prior history of Echocardiogram examinations.                  Risk Factors:Current Smoker.  Sonographer:     Koleen Popper RDCS Referring Phys:  [id number] [facility] A SANTO Diagnosing Phys: Shelda Bruckner MD  Sonographer Comments: Image acquisition challenging due to respiratory motion. PROCEDURE: After discussion of the risks and benefits of a TEE, an informed consent was obtained from the patient. The transesophogeal probe was passed without difficulty through the esophogus of the patient. Imaged were obtained with the patient in a left lateral decubitus position. Sedation performed by different physician. The patient was monitored while under deep sedation. Anesthestetic sedation was provided intravenously by Anesthesiology: 270mg  of Propofol , 80mg  of Lidocaine . The patient's vital signs; including heart rate, blood pressure, and oxygen saturation; remained stable throughout the procedure. The patient developed no complications during the procedure.  IMPRESSIONS  1. Left ventricular ejection fraction, by estimation, is 60 to 65%. The left ventricle has normal function.  2. Right ventricular systolic function is normal. The right ventricular size is normal. Mildly increased right ventricular wall thickness.  3. No left atrial/left atrial appendage thrombus was detected. The LAA emptying velocity was 53 cm/s.  4. There is a very small, mobile echodensity on the anterior leaflet of the mitral valve consistent with vegetation (see images 10, 29, 35). The mitral valve is abnormal. Trivial mitral valve regurgitation. No evidence of mitral stenosis.  5. The aortic valve is tricuspid. Aortic valve regurgitation is not visualized. No aortic stenosis is present.  6. Agitated saline contrast bubble study was positive  with shunting observed within 3-6 cardiac cycles suggestive of interatrial shunt.  7. 3D performed of the mitral valve and demonstrates Small mobile echodensity on A2. Conclusion(s)/Recommendation(s): Small mobile echodensity on A2 segment of anterior mitral valve. Given clinical scenario, this is consistent with endocarditis. Findings communicated to primary and ID teams. FINDINGS  Left Ventricle: Left ventricular ejection fraction, by estimation, is 60 to 65%. The left ventricle has normal function. The left ventricular internal cavity size was normal in size. Right Ventricle: The right ventricular size is normal. Mildly increased right ventricular wall thickness. Right ventricular systolic function is normal. Left Atrium: Left atrial size was normal in size. No left atrial/left atrial appendage thrombus was detected. The LAA emptying velocity was 53 cm/s. Right Atrium: Right atrial size was normal in size. Pericardium: There is no evidence of pericardial effusion. Mitral Valve: There is a very small, mobile echodensity on the anterior leaflet of the mitral valve consistent with vegetation (see images 10, 29, 35). The mitral valve is abnormal. Trivial mitral valve regurgitation. No evidence of mitral valve stenosis. Tricuspid Valve: The tricuspid valve is normal in structure. Tricuspid valve regurgitation is trivial. No evidence of tricuspid stenosis. There is no evidence of tricuspid valve vegetation. Aortic Valve: The aortic valve is tricuspid. Aortic valve regurgitation is not visualized. No aortic stenosis is present. There is no evidence of aortic valve vegetation. Pulmonic Valve: The pulmonic valve was grossly normal. Pulmonic valve regurgitation is trivial. No evidence of pulmonic stenosis. There  is no evidence of pulmonic valve vegetation. Aorta: The aortic root and ascending aorta are structurally normal, with no evidence of dilitation. IAS/Shunts: No atrial level shunt detected by color flow Doppler.  Agitated saline contrast was given intravenously to evaluate for intracardiac shunting. Agitated saline contrast bubble study was positive with shunting observed within 3-6 cardiac cycles suggestive of interatrial shunt. Additional Comments: Spectral Doppler performed. LEFT VENTRICLE PLAX 2D LVOT diam:     2.10 cm LVOT Area:     3.46 cm   AORTA Ao Root diam: 2.70 cm  SHUNTS Systemic Diam: 2.10 cm Shelda Bruckner MD Electronically signed by Shelda Bruckner MD Signature Date/Time: 01/01/2024/3:27:23 PM    Final    EP STUDY Result Date: 01/01/2024 See surgical note for result.     Assessment/Plan:  INTERVAL HISTORY: pt being seen by Cardiology   Principal Problem:   C. albicans candidemia (HCC) Active Problems:   Hypokalemia   Hypocalcemia   Transaminitis   Kidney stone   Endocarditis of mitral valve   Fungal endocarditis    Tracy Baker is a 68 y.o. female with candidemia after urologic procedure with a trip see placement of stent and removal of stent hospitalized in [facility] [address]  [facility].  Candida failed to clear with some blood cultures despite being on appropriate antifungal therapy prompting concern for fungal endocarditis.  She ended up leaving hospital in [place]  and drove to [facility] and is been admitted to our healthcare system.   Due to micafungin due to concerns for fungal endocarditis a couple of other concerns  Fortunately she did not have candidal endophthalmitis  UNFORrtunately she has now been fine to have mitral valve endocarditis fungal endocarditis is in itself an indication for valve replacement as antifungal therapy alone is not curative in the situations.  Certainly if she cannot undergo surgery we would plan on giving her a 6-week course of micafungin followed by indefinite suppressive fluconazole.  She would seem to me to be an excellent surgical candidate as she was in quite a good state of health prior to her having had  this procedure and become candidemic  She has been seen by Dr. Kerrin who is considering MVR  He needs cardiac catheterization first and also clearance from a dental standpoint prior to proceeding with cardiothoracic surgery.  Exam and I does not clear that this was due to the fluconazole  I personally spent a total of 50 minutes in the care of the patient today including preparing to see the patient, getting/reviewing separately obtained history, performing a medically appropriate exam/evaluation, counseling and educating, referring and communicating with other health care professionals, documenting clinical information in the EHR, independently interpreting results, and coordinating care.  Evaluation of the patient requires complex antimicrobial therapy evaluation, counseling , isolation needs to reduce disease transmission and risk assessment and mitigation.     LOS: 1 day   Tracy Baker 01/02/2024, 2:08 PM

## 2024-01-02 NOTE — Progress Notes (Signed)
 Progress Note  Patient Name: Tracy Baker Date of Encounter: 01/02/2024 [facility] Cardiologist: None   Interval Summary    The patient is a 68 year old woman with no significant past medical history prior to this month, who developed Candida albicans fungemia and mitral valve vegetation following a recent urologic procedure. She underwent lithotripsy with right ureteral stent placement on 12/14/2023 at [facility], followed by stent removal four days later. Soon after, she experienced fever, chills, and malaise that progressively worsened. She presented to [facility] where blood and urine cultures grew Candida albicans and was started on fluconazole. After partial treatment, she came to [facility] for further care. Here, IV fluconazole was resumed initially, then transitioned to micafungin after ID consultation. Repeat blood and urine cultures have remained negative, and the patient today reports feeling better with no fever, chills, chest pain, or shortness of breath. She appears comfortable and cooperative, though somewhat anxious about her diagnosis.   Vital Signs Vitals:   01/01/24 1637 01/01/24 1935 01/02/24 0514 01/02/24 0800  BP: (!) 148/73 137/64 136/67 131/76  Pulse: 69 72 65   Resp: 18 18 17 18   Temp: 98 F (36.7 C) 98.6 F (37 C) 98.3 F (36.8 C) 98.4 F (36.9 C)  TempSrc: Oral Oral Oral Oral  SpO2: 97% 94% 93% 95%  Weight:      Height:        Intake/Output Summary (Last 24 hours) at 01/02/2024 1201 Last data filed at 01/01/2024 1327 Gross per 24 hour  Intake 755 ml  Output --  Net 755 ml      12/29/2023    4:00 AM 11/08/2023    8:37 AM 07/09/2023    8:36 AM  Last 3 Weights  Weight (lbs) 154 lb 1.6 oz 159 lb 160 lb  Weight (kg) 69.9 kg 72.122 kg 72.576 kg      Telemetry/ECG   - Personally Reviewed  Physical Exam  GEN: No acute distress.   Neck: No JVD Cardiac: RRR, no murmurs, rubs, or gallops.  Respiratory: Clear to auscultation bilaterally. GI: Soft,  nontender, non-distended  MS: No edema  Assessment & Plan   # Fungal Endocarditis (Candida albicans) 68 year old woman with recent lithotripsy and ureteral stent placement developed candidemia after her procedure. Initial blood and urine cultures at [facility] grew Candida albicans while on fluconazole. She left AMA and presented to [facility], where repeat cultures are now negative on micafungin. TEE on 10/28 showed a small, mobile (~4 mm) vegetation on the anterior mitral leaflet with trivial MR and normal biventricular function. Ophthalmology ruled out endophthalmitis. She remains afebrile and hemodynamically stable. Although fungal endocarditis is typically an indication for valve replacement, her vegetation is very small and there is no evidence of valvular destruction or heart failure at this time.  Plan: Continue micafungin 150 mg IV daily per ID (day 4). Repeat TEE in ~1-2 weeks or sooner if clinical change. Continue multidisciplinary follow-up with ID, cardiology, and CT surgery.  # Candidemia secondary to urologic procedure Likely source from recent ureteral manipulation. Currently resolved; repeat blood and urine cultures negative. Plan: Continue antifungal per ID. Maintain good line care; avoid unnecessary catheters. Trend CBC and CMP for micafungin monitoring.  # Transaminitis (improving) Mild AST/ALT elevation noted on admission, likely multifactorial (infection, prior fluconazole use). RUQ ultrasound normal. LFTs improving. Plan: Monitor LFTs every 2-3 days while on antifungal therapy. Avoid hepatotoxic medications.  # Hypokalemia / Hypocalcemia Mild, asymptomati Continue oral KCl 40 mEq daily. Recheck BMP tomorrow.  # Poor Dentition If  surgical intervention is pursued, dental clearance will be required due to potential source of bacteremia/fungemia. Plan: Arrange dental evaluation if CT surgery proceeds.  # Tobacco Use Smokes ~1 pack per day; discussed cessation, especially  given potential surgical need. Plan Provide smoking cessation counseling and offer nicotine replacement if agreeable.  # Anxiety Mild situational anxiety regarding diagnosis; previously prescribed clonazepam  PRN. Plan: Continue clonazepam  0.25-0.5 mg PRN for anxiety.    For questions or updates, please contact [facility] Please consult [email or web address] for contact info under       Signed, Maryam Azadegan, MD   Tracy Baker was seen by me today along with Dr. Bernadine, MD, resident physician. I have personally performed an evaluation on this patient.  My findings are as follows: 68 y.o. female with history of tobacco abuse and recent finding of MV endocarditis who we are asked to see today to discuss planning for MV surgery. She had a recent kidney stone treated with lithotripsy and right ureteral stent followed by stent removal at [facility] 3 weeks ago. She became febrile several days later and was admitted to [facility] in [place], [name] where she grew Candida from blood and urine cultures. 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 She is being followed by CT surgery and the ID team and planning is underway for mitral valve replacement.  We are asked to see her today to arrange a cardiac catheterization prior to her planned MV surgery.  She has no complaints today.  She denies chest pain, dyspnea or fevers.   Data: EKG(s) and pertinent labs, studies, etc were personally reviewed and interpreted by me:  No EKG on file Tele: sinus Labs reviewed Otherwise, I agree with data as outlined by the advanced practice provider.  Exam performed by me: Gen: NAD Neck: No JVD Cardiac: RRR no murmurs Lungs: clear bilaterally Extremities: no LE edema  My Assessment and Plan:   Mitral valve endocarditis: Mass on MV with positive blood cultures at outside hospital for Candida. She has been seen by CT surgery, Dr. Kerrin, and will most likely need  mitral valve replacement. Anti-fungal regimen per ID team.   Right and left heart cath on Friday 01/04/24.    I have reviewed the risks, indications, and alternatives to cardiac catheterization, possible angioplasty, and stenting with the patient. Risks include but are not limited to bleeding, infection, vascular injury, stroke, myocardial infection, arrhythmia, kidney injury, radiation-related injury in the case of prolonged fluoroscopy use, emergency cardiac surgery, and death. The patient understands the risks of serious complication is 1-2 in 1000 with diagnostic cardiac cath and 1-2% or less with angioplasty/stenting.   Signed,  Lonni Cash, MD  01/02/2024 2:34 PM

## 2024-01-02 NOTE — Progress Notes (Signed)
 PROGRESS NOTE  Tracy Baker FMW:[id number] DOB: 1955-04-22   PCP: Kristina Tinnie POUR, PA-C  Patient is from: Home  DOA: 12/28/2023 LOS: 1  Chief complaints Chief Complaint  Patient presents with   Candidemia     Brief Narrative / Interim history: 68 year old F with PMH of nephrolithiasis s/p ureteral stent and lithotripsy at [facility] on 10/10 with stent removal on 10/14 presented to ED after she left AMA from [facility] in [place].  Patient was hospitalized there with Candida albicans candidemia.  CT showed stranding around the right kidney and ureter.  Urology was consulted and she was started on Rocephin.  However, urine and blood cultures grew Candida albicans.  ID was consulted and she was started on Diflucan.  However, subsequent cultures continue to grow Candida albicans despite appropriate antibiotics raising concern for endocarditis.  TTE without significant finding.  On arrival here, hemodynamically stable.  Hgb 17 with HCT of 50.  Mildly elevated LFT.  Blood cultures obtained.  ID consulted.  She was started on micafungin.  TEE on 10/28 with small mobile echodensity on A2 segment of anterior mitral valve raising concern for mitral valve vegetation.  Cardiothoracic surgery consulted, and recommended dental evaluation and cardiac catheterization before MVR next week   Subjective: Seen and examined earlier this morning.  No major events overnight or this morning.  No complaints.   Assessment and plan: C. 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  TTE reportedly negative.  Blood cultures NGTD.  TEE raises concern for mitral valve vegetation/endocarditis. -Plan for MVR after dental evaluation and heart catheterization per CVTS. -Continue micafungin per ID   History of nephrolithiasis s/p ureteral stent and lithotripsy on 10/10 and stent removal on 10/14 at [facility]  Transaminitis: Likely due to the above.  He was also on Diflucan.   Resolved.   Hypocalcemia corrects to normal for hypoalbuminemia.  Poor dentition/dental caries -Dental surgery consulted   Hypokalemia -Monitor replenish K and Mg as appropriate  Body mass index is 26.45 kg/m.           DVT prophylaxis:  enoxaparin (LOVENOX) injection 40 mg Start: 12/29/23 1600  Code Status: Full code Family Communication: None at bedside Level of care: Med-Surg Status is: Inpatient Remains inpatient appropriate because: Fungal endocarditis   Final disposition: Home   35 minutes with more than 50% spent in reviewing records, counseling patient/family and coordinating care.  Consultants:  Infectious disease Cardiothoracic surgery Cardiology Dental surgery  Procedures: 10/28-TEE  Microbiology summarized: 10/24-blood cultures NGTD  Objective: Vitals:   01/01/24 1637 01/01/24 1935 01/02/24 0514 01/02/24 0800  BP: (!) 148/73 137/64 136/67 131/76  Pulse: 69 72 65   Resp: 18 18 17 18   Temp: 98 F (36.7 C) 98.6 F (37 C) 98.3 F (36.8 C) 98.4 F (36.9 C)  TempSrc: Oral Oral Oral Oral  SpO2: 97% 94% 93% 95%  Weight:      Height:        Examination:  GENERAL: No apparent distress.  Nontoxic. HEENT: MMM.  Vision and hearing grossly intact.  NECK: Supple.  No apparent JVD.  RESP:  No IWOB.  Fair aeration bilaterally. CVS:  RRR. Heart sounds like mechanical valve ABD/GI/GU: BS+. Abd soft, NTND.  MSK/EXT:  Moves extremities. No apparent deformity. No edema.  SKIN: no apparent skin lesion or wound NEURO: AA.  Oriented appropriately.  No apparent focal neuro deficit. PSYCH: Calm. Normal affect.   Sch Meds:  Scheduled Meds:  calcium carbonate  1 tablet Oral  Q breakfast   enoxaparin (LOVENOX) injection  40 mg Subcutaneous Q24H   pantoprazole   20 mg Oral Daily   potassium chloride  40 mEq Oral Daily   Continuous Infusions:  micafungin (MYCAMINE) 150 mg in sodium chloride  0.9 % 100 mL IVPB 150 mg (01/02/24 1203)   PRN  Meds:.acetaminophen **OR** acetaminophen, clonazePAM , ondansetron   Antimicrobials: Anti-infectives (From admission, onward)    Start     Dose/Rate Route Frequency Ordered Stop   12/29/23 1100  micafungin (MYCAMINE) 150 mg in sodium chloride  0.9 % 100 mL IVPB        150 mg 107.5 mL/hr over 1 Hours Intravenous Every 24 hours 12/29/23 1005     12/29/23 1000  fluconazole (DIFLUCAN) IVPB 400 mg  Status:  Discontinued        400 mg 100 mL/hr over 120 Minutes Intravenous Every 24 hours 12/29/23 0424 12/29/23 1005        I have personally reviewed the following labs and images: CBC: Recent Labs  Lab 12/28/23 1831 12/28/23 1950 12/30/23 0326 12/31/23 0501 01/01/24 0446  WBC 8.6  --  6.8 6.1 7.4  NEUTROABS 5.9  --   --   --   --   HGB 11.9* 17.0* 10.4* 10.3* 10.7*  HCT 36.5 50.0* 31.2* 31.7* 31.4*  MCV 98.1  --  95.7 97.2 93.2  PLT 338  --  306 323 335   BMP &GFR Recent Labs  Lab 12/28/23 1831 12/28/23 1950 12/30/23 0326 12/31/23 0501 01/01/24 0446  NA 136 138 139 138 137  K 3.3* 3.5 3.2* 3.4* 3.3*  CL 100 102 100 103 103  CO2 23  --  23 23 25   GLUCOSE 100* 99 111* 100* 115*  BUN 11 13 10 9 10   CREATININE 0.66 0.60 0.58 0.64 0.64  CALCIUM 8.6*  --  8.1* 8.3* 8.4*   Estimated Creatinine Clearance: 64.6 mL/min (by C-G formula based on SCr of 0.64 mg/dL). Liver & Pancreas: Recent Labs  Lab 12/28/23 1831 12/30/23 0326 12/31/23 0501 01/01/24 0446  AST 47* 16 14* 12*  ALT 81* 44 35 29  ALKPHOS 366* 258* 305* 303*  BILITOT 1.1 1.0 0.8 0.8  PROT 7.2 6.4* 6.5 6.7  ALBUMIN 2.6* 2.1* 2.2* 2.2*   No results for input(s): LIPASE, AMYLASE in the last 168 hours. No results for input(s): AMMONIA in the last 168 hours. Diabetic: No results for input(s): HGBA1C in the last 72 hours. No results for input(s): GLUCAP in the last 168 hours. Cardiac Enzymes: No results for input(s): CKTOTAL, CKMB, CKMBINDEX, TROPONINI in the last 168 hours. No results for  input(s): PROBNP in the last 8760 hours. Coagulation Profile: No results for input(s): INR, PROTIME in the last 168 hours. Thyroid  Function Tests: No results for input(s): TSH, T4TOTAL, FREET4, T3FREE, THYROIDAB in the last 72 hours. Lipid Profile: No results for input(s): CHOL, HDL, LDLCALC, TRIG, CHOLHDL, LDLDIRECT in the last 72 hours. Anemia Panel: No results for input(s): VITAMINB12, FOLATE, FERRITIN, TIBC, IRON, RETICCTPCT in the last 72 hours. Urine analysis:    Component Value Date/Time   COLORURINE YELLOW 12/28/2023 1920   APPEARANCEUR HAZY (A) 12/28/2023 1920   APPEARANCEUR Clear 06/08/2022 1530   LABSPEC 1.012 12/28/2023 1920   PHURINE 5.0 12/28/2023 1920   GLUCOSEU NEGATIVE 12/28/2023 1920   HGBUR MODERATE (A) 12/28/2023 1920   BILIRUBINUR NEGATIVE 12/28/2023 1920   BILIRUBINUR Negative 11/08/2023 0848   BILIRUBINUR Negative 06/08/2022 1530   KETONESUR NEGATIVE 12/28/2023 1920   PROTEINUR NEGATIVE 12/28/2023  1920   UROBILINOGEN 0.2 11/08/2023 0848   NITRITE NEGATIVE 12/28/2023 1920   LEUKOCYTESUR TRACE (A) 12/28/2023 1920   Sepsis Labs: Invalid input(s): PROCALCITONIN, LACTICIDVEN  Microbiology: Recent Results (from the past 240 hours)  Blood culture (routine x 2)     Status: None   Collection Time: 12/28/23  6:57 PM   Specimen: BLOOD  Result Value Ref Range Status   Specimen Description BLOOD RIGHT ANTECUBITAL  Final   Special Requests   Final    BOTTLES DRAWN AEROBIC AND ANAEROBIC Blood Culture adequate volume   Culture   Final    NO GROWTH 5 DAYS Performed at [facility], [address], [place], [place] [address]    Report Status 01/02/2024 FINAL  Final  Blood culture (routine x 2)     Status: None   Collection Time: 12/28/23  7:06 PM   Specimen: BLOOD RIGHT HAND  Result Value Ref Range Status   Specimen Description BLOOD RIGHT HAND  Final   Special Requests   Final    BOTTLES DRAWN AEROBIC AND  ANAEROBIC Blood Culture adequate volume   Culture   Final    NO GROWTH 5 DAYS Performed at [facility], [address], [place], [place] [address]    Report Status 01/02/2024 FINAL  Final  Urine Culture     Status: None   Collection Time: 12/29/23  8:37 AM   Specimen: Urine, Clean Catch  Result Value Ref Range Status   Specimen Description URINE, CLEAN CATCH  Final   Special Requests NONE  Final   Culture   Final    NO GROWTH Performed at [facility], [address], [place], [place] [address]    Report Status 12/30/2023 FINAL  Final    Radiology Studies: DG Orthopantogram Result Date: 01/02/2024 EXAM: [name] FLORENCE VIEW 01/02/2024 09:38:00 AM TECHNIQUE: Panorex view of the mandible. COMPARISON: None available. CLINICAL HISTORY: At risk for dental caries [id number]. Pre cardiac surgery work-up. Poor dentition; Mitral valve. FINDINGS: DENTAL: Multiple missing mandibular and maxillary teeth. Multiple dental caries. BONES: No acute fracture or focal osseous lesion. TMJ: No dislocation. SOFT TISSUES: The soft tissues are unremarkable. IMPRESSION: 1. Multiple missing mandibular and maxillary teeth. 2. Multiple dental caries. Electronically signed by: Franky Crease MD 01/02/2024 01:41 PM EDT RP Workstation: [id number]      Bennetta Rudden T. Brace Welte [facility]  If 7PM-7AM, please contact night-coverage [email or web address] 01/02/2024, 3:30 PM

## 2024-01-02 NOTE — Progress Notes (Signed)
 VASCULAR LAB    Carotid duplex has been performed.  See CV proc for preliminary results.   Aliany Fiorenza, RVT 01/02/2024, 12:33 PM

## 2024-01-02 NOTE — Consult Note (Signed)
 Reason for Consult: Dental consult pre MVR Referring Physician: Zyann Mabry is an 68 y.o. female.  CC: No oral pain, sensitivity, loose teeth.    Past Medical History:  Diagnosis Date   Thyroid  disease     Past Surgical History:  Procedure Laterality Date   APPENDECTOMY     AUGMENTATION MAMMAPLASTY Bilateral 1981   removed in 2006   BREAST IMPLANT REMOVAL Bilateral 2007   CHOLECYSTECTOMY  2010   COLONOSCOPY WITH PROPOFOL  N/A 06/30/2021   Procedure: COLONOSCOPY WITH PROPOFOL ;  Surgeon: Jinny Carmine, MD;  Location: [facility] ENDOSCOPY;  Service: Endoscopy;  Laterality: N/A;   LAPAROSCOPIC TOTAL HYSTERECTOMY  2006   TRANSESOPHAGEAL ECHOCARDIOGRAM (CATH LAB) N/A 01/01/2024   Procedure: TRANSESOPHAGEAL ECHOCARDIOGRAM;  Surgeon: Lonni Slain, MD;  Location: [facility] INVASIVE CV LAB;  Service: Cardiovascular;  Laterality: N/A;    Family History  Problem Relation Age of Onset   Hypertension Mother    Diabetes Father    Breast cancer Neg Hx     Social History:  reports that she has been smoking cigarettes. She has never used smokeless tobacco. She reports current alcohol use. She reports that she does not use drugs.  Allergies:  Allergies  Allergen Reactions   Codeine Anaphylaxis and Nausea And Vomiting   Red Dye #40 (Allura Red) Nausea Only    Medications: I have reviewed the patient's current medications.  Results for orders placed or performed during the hospital encounter of 12/28/23 (from the past 48 hours)  CBC     Status: Abnormal   Collection Time: 01/01/24  4:46 AM  Result Value Ref Range   WBC 7.4 4.0 - 10.5 K/uL   RBC 3.37 (L) 3.87 - 5.11 MIL/uL   Hemoglobin 10.7 (L) 12.0 - 15.0 g/dL   HCT 68.5 (L) 63.9 - 53.9 %   MCV 93.2 80.0 - 100.0 fL   MCH 31.8 26.0 - 34.0 pg   MCHC 34.1 30.0 - 36.0 g/dL   RDW 87.6 88.4 - 84.4 %   Platelets 335 150 - 400 K/uL   nRBC 0.0 0.0 - 0.2 %    Comment: Performed at [facility], [address]  [place], [place] [address]  Comprehensive metabolic panel     Status: Abnormal   Collection Time: 01/01/24  4:46 AM  Result Value Ref Range   Sodium 137 135 - 145 mmol/L   Potassium 3.3 (L) 3.5 - 5.1 mmol/L   Chloride 103 98 - 111 mmol/L   CO2 25 22 - 32 mmol/L   Glucose, Bld 115 (H) 70 - 99 mg/dL    Comment: Glucose reference range applies only to samples taken after fasting for at least 8 hours.   BUN 10 8 - 23 mg/dL   Creatinine, Ser 9.35 0.44 - 1.00 mg/dL   Calcium 8.4 (L) 8.9 - 10.3 mg/dL   Total Protein 6.7 6.5 - 8.1 g/dL   Albumin 2.2 (L) 3.5 - 5.0 g/dL   AST 12 (L) 15 - 41 U/L   ALT 29 0 - 44 U/L   Alkaline Phosphatase 303 (H) 38 - 126 U/L   Total Bilirubin 0.8 0.0 - 1.2 mg/dL   GFR, Estimated >39 >39 mL/min    Comment: (NOTE) Calculated using the CKD-EPI Creatinine Equation (2021)    Anion gap 9 5 - 15    Comment: Performed at [facility], [address], [place], [place] [address]    DG Orthopantogram Result Date: 01/02/2024 EXAM: PAMALEE SPRINKLE, PANOREX  VIEW 01/02/2024 09:38:00 AM TECHNIQUE: Panorex view of the mandible. COMPARISON: None available. CLINICAL HISTORY: At risk for dental caries [id number]. Pre cardiac surgery work-up. Poor dentition; Mitral valve. FINDINGS: DENTAL: Multiple missing mandibular and maxillary teeth. Multiple dental caries. BONES: No acute fracture or focal osseous lesion. TMJ: No dislocation. SOFT TISSUES: The soft tissues are unremarkable. IMPRESSION: 1. Multiple missing mandibular and maxillary teeth. 2. Multiple dental caries. Electronically signed by: Franky Crease MD 01/02/2024 01:41 PM EDT RP Workstation: [id number]   ECHO TEE Result Date: 01/01/2024    TRANSESOPHOGEAL ECHO REPORT   Patient Name:   Tracy Baker Date of Exam: 01/01/2024 Medical Rec #:  [id number]      Height:       64.0 in Accession #:    [id number]     Weight:       154.1 lb Date of Birth:  [birth year]      BSA:          1.751 m Patient Age:    68 years       BP:            168/64 mmHg Patient Gender: F              HR:           80 bpm. Exam Location:  Inpatient Procedure: Transesophageal Echo, Cardiac Doppler, Color Doppler, Saline Contrast            Bubble Study and 3D Echo (Both Spectral and Color Flow Doppler were            utilized during procedure). Indications:     Endocarditis  History:         Patient has no prior history of Echocardiogram examinations.                  Risk Factors:Current Smoker.  Sonographer:     Koleen Popper RDCS Referring Phys:  [id number] [facility] A SANTO Diagnosing Phys: Shelda Bruckner MD  Sonographer Comments: Image acquisition challenging due to respiratory motion. PROCEDURE: After discussion of the risks and benefits of a TEE, an informed consent was obtained from the patient. The transesophogeal probe was passed without difficulty through the esophogus of the patient. Imaged were obtained with the patient in a left lateral decubitus position. Sedation performed by different physician. The patient was monitored while under deep sedation. Anesthestetic sedation was provided intravenously by Anesthesiology: 270mg  of Propofol , 80mg  of Lidocaine . The patient's vital signs; including heart rate, blood pressure, and oxygen saturation; remained stable throughout the procedure. The patient developed no complications during the procedure.  IMPRESSIONS  1. Left ventricular ejection fraction, by estimation, is 60 to 65%. The left ventricle has normal function.  2. Right ventricular systolic function is normal. The right ventricular size is normal. Mildly increased right ventricular wall thickness.  3. No left atrial/left atrial appendage thrombus was detected. The LAA emptying velocity was 53 cm/s.  4. There is a very small, mobile echodensity on the anterior leaflet of the mitral valve consistent with vegetation (see images 10, 29, 35). The mitral valve is abnormal. Trivial mitral valve regurgitation. No evidence of mitral stenosis.  5. The  aortic valve is tricuspid. Aortic valve regurgitation is not visualized. No aortic stenosis is present.  6. Agitated saline contrast bubble study was positive with shunting observed within 3-6 cardiac cycles suggestive of interatrial shunt.  7. 3D performed of the mitral valve and demonstrates Small mobile echodensity on A2. Conclusion(s)/Recommendation(s): Small mobile echodensity on A2  segment of anterior mitral valve. Given clinical scenario, this is consistent with endocarditis. Findings communicated to primary and ID teams. FINDINGS  Left Ventricle: Left ventricular ejection fraction, by estimation, is 60 to 65%. The left ventricle has normal function. The left ventricular internal cavity size was normal in size. Right Ventricle: The right ventricular size is normal. Mildly increased right ventricular wall thickness. Right ventricular systolic function is normal. Left Atrium: Left atrial size was normal in size. No left atrial/left atrial appendage thrombus was detected. The LAA emptying velocity was 53 cm/s. Right Atrium: Right atrial size was normal in size. Pericardium: There is no evidence of pericardial effusion. Mitral Valve: There is a very small, mobile echodensity on the anterior leaflet of the mitral valve consistent with vegetation (see images 10, 29, 35). The mitral valve is abnormal. Trivial mitral valve regurgitation. No evidence of mitral valve stenosis. Tricuspid Valve: The tricuspid valve is normal in structure. Tricuspid valve regurgitation is trivial. No evidence of tricuspid stenosis. There is no evidence of tricuspid valve vegetation. Aortic Valve: The aortic valve is tricuspid. Aortic valve regurgitation is not visualized. No aortic stenosis is present. There is no evidence of aortic valve vegetation. Pulmonic Valve: The pulmonic valve was grossly normal. Pulmonic valve regurgitation is trivial. No evidence of pulmonic stenosis. There is no evidence of pulmonic valve vegetation. Aorta: The  aortic root and ascending aorta are structurally normal, with no evidence of dilitation. IAS/Shunts: No atrial level shunt detected by color flow Doppler. Agitated saline contrast was given intravenously to evaluate for intracardiac shunting. Agitated saline contrast bubble study was positive with shunting observed within 3-6 cardiac cycles suggestive of interatrial shunt. Additional Comments: Spectral Doppler performed. LEFT VENTRICLE PLAX 2D LVOT diam:     2.10 cm LVOT Area:     3.46 cm   AORTA Ao Root diam: 2.70 cm  SHUNTS Systemic Diam: 2.10 cm Shelda Bruckner MD Electronically signed by Shelda Bruckner MD Signature Date/Time: 01/01/2024/3:27:23 PM    Final    EP STUDY Result Date: 01/01/2024 See surgical note for result.   ROS Blood pressure 131/76, pulse 65, temperature 98.4 F (36.9 C), temperature source Oral, resp. rate 18, height 5' 4 (1.626 m), weight 69.9 kg, SpO2 95%. General appearance: alert, cooperative, and no distress Head: Normocephalic, without obvious abnormality, atraumatic Eyes: negative Nose: Nares normal. Septum midline. Mucosa normal. No drainage or sinus tenderness. Throat: Multiple dental caries and missing teeth. Gross lingual calculus with gingival edema lower anterior teeth. No mobility or percussion tenderness of  teeth. Pharynx clear.  Neck: no adenopathy and thyroid  not enlarged, symmetric, no tenderness/mass/nodules  Assessment/Plan: Multiple non-restorable teeth secondary to dental caries and periodontal disease. Ideally would remove all upper teeth, scale and root plane lower teeth with extraction of tooth # 29. Patient doesn't want all her upper teeth removed because she can no longer wear her upper partial denture. Discussed removing the worst 5 or 6 teeth with scaling and root planing the lowers, leaving enough teeth so she can wear her partial denture, but no guarantee she wouldn't have dental issues over the next 6 months. Pt will discuss with  her daughter and decide.  Glendia Primrose 01/02/2024, 2:54 PM

## 2024-01-02 NOTE — Plan of Care (Signed)
  Problem: Education: Goal: Knowledge of General Education information will improve Description: Including pain rating scale, medication(s)/side effects and non-pharmacologic comfort measures Outcome: Progressing   Problem: Health Behavior/Discharge Planning: Goal: Ability to manage health-related needs will improve Outcome: Progressing   Problem: Clinical Measurements: Goal: Ability to maintain clinical measurements within normal limits will improve Outcome: Progressing Goal: Will remain free from infection Outcome: Progressing   Problem: Activity: Goal: Risk for activity intolerance will decrease Outcome: Progressing   Problem: Nutrition: Goal: Adequate nutrition will be maintained Outcome: Progressing   Problem: Elimination: Goal: Will not experience complications related to bowel motility Outcome: Progressing   Problem: Pain Managment: Goal: General experience of comfort will improve and/or be controlled Outcome: Progressing   Problem: Safety: Goal: Ability to remain free from injury will improve Outcome: Progressing   Problem: Skin Integrity: Goal: Risk for impaired skin integrity will decrease Outcome: Progressing   Problem: Coping: Goal: Level of anxiety will decrease Outcome: Not Progressing

## 2024-01-02 NOTE — Progress Notes (Signed)
 1 Day Post-Op Procedure(s) (LRB): TRANSESOPHAGEAL ECHOCARDIOGRAM (N/A) Subjective: She is upset that she will require dental extractions  Objective: Vital signs in last 24 hours: Temp:  [98 F (36.7 C)-98.6 F (37 C)] 98.4 F (36.9 C) (10/29 0800) Pulse Rate:  [65-72] 65 (10/29 0514) Resp:  [17-18] 18 (10/29 0800) BP: (131-148)/(64-76) 131/76 (10/29 0800) SpO2:  [93 %-97 %] 95 % (10/29 0800)  Hemodynamic parameters for last 24 hours:    Intake/Output from previous day: 10/28 0701 - 10/29 0700 In: 755 [P.O.:200; I.V.:125; IV Piggyback:430] Out: -  Intake/Output this shift: No intake/output data recorded.  General appearance: alert, cooperative, and no distress Neurologic: intact Heart: regular rate and rhythm Lungs: clear to auscultation bilaterally  Lab Results: Recent Labs    12/31/23 0501 01/01/24 0446  WBC 6.1 7.4  HGB 10.3* 10.7*  HCT 31.7* 31.4*  PLT 323 335   BMET:  Recent Labs    12/31/23 0501 01/01/24 0446  NA 138 137  K 3.4* 3.3*  CL 103 103  CO2 23 25  GLUCOSE 100* 115*  BUN 9 10  CREATININE 0.64 0.64  CALCIUM 8.3* 8.4*    PT/INR: No results for input(s): LABPROT, INR in the last 72 hours. ABG    Component Value Date/Time   TCO2 25 12/28/2023 1950   CBG (last 3)  No results for input(s): GLUCAP in the last 72 hours.  Assessment/Plan: S/P Procedure(s) (LRB): TRANSESOPHAGEAL ECHOCARDIOGRAM (N/A) Candida endocarditis Although vegetation is small and no MR, there is no reliable data to suggest treating with antifungals alone. Best option in my opinion is surgical resection of vegetation.  Given small size of vegetation, we may be able to preserve her native valve.  However we won't know that until time of surgery and she may end up needing MVR.  She has been seen by Cardiology, cath Friday  She has been seen Oral surgery- will need extractions, extent under discussion with patient and Dr. Sheryle, that is planned for Monday  11/3  Tentatively plan for Mitral surgery Thursday 11/6  .    LOS: 1 day    Tracy Baker 01/02/2024

## 2024-01-03 DIAGNOSIS — I059 Rheumatic mitral valve disease, unspecified: Secondary | ICD-10-CM | POA: Diagnosis not present

## 2024-01-03 DIAGNOSIS — B377 Candidal sepsis: Secondary | ICD-10-CM | POA: Diagnosis not present

## 2024-01-03 DIAGNOSIS — I330 Acute and subacute infective endocarditis: Secondary | ICD-10-CM | POA: Diagnosis not present

## 2024-01-03 LAB — CBC
HCT: 33.4 % — ABNORMAL LOW (ref 36.0–46.0)
Hemoglobin: 11.3 g/dL — ABNORMAL LOW (ref 12.0–15.0)
MCH: 31.9 pg (ref 26.0–34.0)
MCHC: 33.8 g/dL (ref 30.0–36.0)
MCV: 94.4 fL (ref 80.0–100.0)
Platelets: 309 K/uL (ref 150–400)
RBC: 3.54 MIL/uL — ABNORMAL LOW (ref 3.87–5.11)
RDW: 12 % (ref 11.5–15.5)
WBC: 5.5 K/uL (ref 4.0–10.5)
nRBC: 0 % (ref 0.0–0.2)

## 2024-01-03 LAB — RENAL FUNCTION PANEL
Albumin: 2.3 g/dL — ABNORMAL LOW (ref 3.5–5.0)
Anion gap: 14 (ref 5–15)
BUN: 13 mg/dL (ref 8–23)
CO2: 22 mmol/L (ref 22–32)
Calcium: 8.7 mg/dL — ABNORMAL LOW (ref 8.9–10.3)
Chloride: 103 mmol/L (ref 98–111)
Creatinine, Ser: 0.71 mg/dL (ref 0.44–1.00)
GFR, Estimated: >60 mL/min (ref >60)
Glucose, Bld: 109 mg/dL — ABNORMAL HIGH (ref 70–99)
Phosphorus: 3.6 mg/dL (ref 2.5–4.6)
Potassium: 4 mmol/L (ref 3.5–5.1)
Sodium: 139 mmol/L (ref 135–145)

## 2024-01-03 LAB — MAGNESIUM: Magnesium: 1.8 mg/dL (ref 1.7–2.4)

## 2024-01-03 NOTE — Progress Notes (Signed)
 Rounding Note   Patient Name: Tracy Baker Date of Encounter: 01/03/2024  [facility] Cardiologist: None   Subjective  No complaints. NO events overnight.  No chest pain or dyspnea  Scheduled Meds:  calcium carbonate  1 tablet Oral Q breakfast   enoxaparin (LOVENOX) injection  40 mg Subcutaneous Q24H   pantoprazole   20 mg Oral Daily   potassium chloride  40 mEq Oral Daily   Continuous Infusions:  micafungin (MYCAMINE) 150 mg in sodium chloride  0.9 % 100 mL IVPB 107.5 mL/hr at 01/03/24 0459   PRN Meds: acetaminophen **OR** acetaminophen, clonazePAM , ondansetron    Vital Signs  Vitals:   01/02/24 0800 01/02/24 1617 01/02/24 1954 01/03/24 0446  BP: 131/76 (!) 144/67 (!) 116/51 (!) 141/70  Pulse:   65 64  Resp: 18 18 18 18   Temp: 98.4 F (36.9 C) 98.1 F (36.7 C) 98.5 F (36.9 C) 98.3 F (36.8 C)  TempSrc: Oral Oral Oral Oral  SpO2: 95% 93% 96% 92%  Weight:      Height:        Intake/Output Summary (Last 24 hours) at 01/03/2024 0755 Last data filed at 01/03/2024 0459 Gross per 24 hour  Intake 107.5 ml  Output --  Net 107.5 ml      12/29/2023    4:00 AM 11/08/2023    8:37 AM 07/09/2023    8:36 AM  Last 3 Weights  Weight (lbs) 154 lb 1.6 oz 159 lb 160 lb  Weight (kg) 69.9 kg 72.122 kg 72.576 kg      Telemetry  ECG   No am ekg  Physical Exam  GEN: No acute distress.   Neck: No JVD Cardiac: RRR, no murmurs, rubs, or gallops.  Respiratory: Clear to auscultation bilaterally. GI: Soft, nontender, non-distended  MS: No edema; No deformity. Neuro:  Nonfocal  Psych: Normal affect   Labs High Sensitivity Troponin:  No results for input(s): TROPONINIHS in the last 720 hours.   Chemistry Recent Labs  Lab 12/30/23 0326 12/31/23 0501 01/01/24 0446 01/03/24 0342  NA 139 138 137 139  K 3.2* 3.4* 3.3* 4.0  CL 100 103 103 103  CO2 23 23 25 22   GLUCOSE 111* 100* 115* 109*  BUN 10 9 10 13   CREATININE 0.58 0.64 0.64 0.71  CALCIUM 8.1* 8.3*  8.4* 8.7*  MG  --   --   --  1.8  PROT 6.4* 6.5 6.7  --   ALBUMIN 2.1* 2.2* 2.2* 2.3*  AST 16 14* 12*  --   ALT 44 35 29  --   ALKPHOS 258* 305* 303*  --   BILITOT 1.0 0.8 0.8  --   GFRNONAA >60 >60 >60 >60  ANIONGAP 16* 12 9 14     Lipids No results for input(s): CHOL, TRIG, HDL, LABVLDL, LDLCALC, CHOLHDL in the last 168 hours.  Hematology Recent Labs  Lab 12/31/23 0501 01/01/24 0446 01/03/24 0342  WBC 6.1 7.4 5.5  RBC 3.26* 3.37* 3.54*  HGB 10.3* 10.7* 11.3*  HCT 31.7* 31.4* 33.4*  MCV 97.2 93.2 94.4  MCH 31.6 31.8 31.9  MCHC 32.5 34.1 33.8  RDW 12.5 12.3 12.0  PLT 323 335 309   Thyroid  No results for input(s): TSH, FREET4 in the last 168 hours.  BNPNo results for input(s): BNP, PROBNP in the last 168 hours.  DDimer No results for input(s): DDIMER in the last 168 hours.   Radiology  VAS US  CAROTID Result Date: 01/02/2024 Carotid Arterial Duplex Study Patient Name:  Tracy  Baker  Date of Exam:   01/02/2024 Medical Rec #: [id number]       Accession #:    [id number] Date of Birth: Dec 01, 1955       Patient Gender: F Patient Age:   81 years Exam Location:  [facility] Procedure:      VAS US  CAROTID Referring Phys: ELSPETH HENDRICKSON --------------------------------------------------------------------------------  Indications:       Pre operative: mitral valve fungal endocarditis. Comparison Study:  Prior near normal carotid duplex done at [facility] 07/08/21 Performing Technologist: Alberta Lis RVS  Examination Guidelines: A complete evaluation includes B-mode imaging, spectral Doppler, color Doppler, and power Doppler as needed of all accessible portions of each vessel. Bilateral testing is considered an integral part of a complete examination. Limited examinations for reoccurring indications may be performed as noted.  Right Carotid Findings: +----------+--------+--------+--------+---------------------+------------------+           PSV cm/sEDV  cm/sStenosisPlaque Description   Comments           +----------+--------+--------+--------+---------------------+------------------+ CCA Prox  96      20                                                      +----------+--------+--------+--------+---------------------+------------------+ CCA Distal72      22                                   intimal thickening +----------+--------+--------+--------+---------------------+------------------+ ICA Prox  117     37      1-39%   irregular and                                                             calcific                                +----------+--------+--------+--------+---------------------+------------------+ ICA Mid   108     36                                                      +----------+--------+--------+--------+---------------------+------------------+ ICA Distal94      26                                                      +----------+--------+--------+--------+---------------------+------------------+ ECA       95      11                                                      +----------+--------+--------+--------+---------------------+------------------+ +----------+--------+-------+--------+-------------------+  PSV cm/sEDV cmsDescribeArm Pressure (mmHG) +----------+--------+-------+--------+-------------------+ Subclavian109                                        +----------+--------+-------+--------+-------------------+ +---------+--------+--+--------+--+ VertebralPSV cm/s51EDV cm/s15 +---------+--------+--+--------+--+  Left Carotid Findings: +----------+--------+--------+--------+------------------+------------------+           PSV cm/sEDV cm/sStenosisPlaque DescriptionComments           +----------+--------+--------+--------+------------------+------------------+ CCA Prox  113     23                                intimal thickening  +----------+--------+--------+--------+------------------+------------------+ CCA Distal98      28                                intimal thickening +----------+--------+--------+--------+------------------+------------------+ ICA Prox  108     29      1-39%   heterogenous                         +----------+--------+--------+--------+------------------+------------------+ ICA Distal128     33                                                   +----------+--------+--------+--------+------------------+------------------+ ECA       91      12                                                   +----------+--------+--------+--------+------------------+------------------+ +----------+--------+--------+--------+-------------------+           PSV cm/sEDV cm/sDescribeArm Pressure (mmHG) +----------+--------+--------+--------+-------------------+ [id number]                                         +----------+--------+--------+--------+-------------------+ +---------+--------+--+--------+--+ VertebralPSV cm/s56EDV cm/s17 +---------+--------+--+--------+--+   Summary: Right Carotid: Velocities in the right ICA are consistent with a 1-39% stenosis. Left Carotid: Velocities in the left ICA are consistent with a 1-39% stenosis. Vertebrals:  Bilateral vertebral arteries demonstrate antegrade flow. Subclavians: Normal flow hemodynamics were seen in bilateral subclavian              arteries. *See table(s) above for measurements and observations.     Preliminary    DG Orthopantogram Result Date: 01/02/2024 EXAM: [name] FLORENCE VIEW 01/02/2024 09:38:00 AM TECHNIQUE: Panorex view of the mandible. COMPARISON: None available. CLINICAL HISTORY: At risk for dental caries [id number]. Pre cardiac surgery work-up. Poor dentition; Mitral valve. FINDINGS: DENTAL: Multiple missing mandibular and maxillary teeth. Multiple dental caries. BONES: No acute fracture or focal osseous  lesion. TMJ: No dislocation. SOFT TISSUES: The soft tissues are unremarkable. IMPRESSION: 1. Multiple missing mandibular and maxillary teeth. 2. Multiple dental caries. Electronically signed by: Franky Crease MD 01/02/2024 01:41 PM EDT RP Workstation: [id number]   ECHO TEE Result Date: 01/01/2024    TRANSESOPHOGEAL ECHO REPORT   Patient Name:   Ronnita Sara Date of Exam: 01/01/2024 Medical Rec #:  [id number]  Height:       64.0 in Accession #:    [id number]     Weight:       154.1 lb Date of Birth:  30-Aug-1955      BSA:          1.751 m Patient Age:    68 years       BP:           168/64 mmHg Patient Gender: F              HR:           80 bpm. Exam Location:  Inpatient Procedure: Transesophageal Echo, Cardiac Doppler, Color Doppler, Saline Contrast            Bubble Study and 3D Echo (Both Spectral and Color Flow Doppler were            utilized during procedure). Indications:     Endocarditis  History:         Patient has no prior history of Echocardiogram examinations.                  Risk Factors:Current Smoker.  Sonographer:     Koleen Popper RDCS Referring Phys:  [id number] [facility] A SANTO Diagnosing Phys: Shelda Bruckner MD  Sonographer Comments: Image acquisition challenging due to respiratory motion. PROCEDURE: After discussion of the risks and benefits of a TEE, an informed consent was obtained from the patient. The transesophogeal probe was passed without difficulty through the esophogus of the patient. Imaged were obtained with the patient in a left lateral decubitus position. Sedation performed by different physician. The patient was monitored while under deep sedation. Anesthestetic sedation was provided intravenously by Anesthesiology: 270mg  of Propofol , 80mg  of Lidocaine . The patient's vital signs; including heart rate, blood pressure, and oxygen saturation; remained stable throughout the procedure. The patient developed no complications during the procedure.  IMPRESSIONS  1. Left  ventricular ejection fraction, by estimation, is 60 to 65%. The left ventricle has normal function.  2. Right ventricular systolic function is normal. The right ventricular size is normal. Mildly increased right ventricular wall thickness.  3. No left atrial/left atrial appendage thrombus was detected. The LAA emptying velocity was 53 cm/s.  4. There is a very small, mobile echodensity on the anterior leaflet of the mitral valve consistent with vegetation (see images 10, 29, 35). The mitral valve is abnormal. Trivial mitral valve regurgitation. No evidence of mitral stenosis.  5. The aortic valve is tricuspid. Aortic valve regurgitation is not visualized. No aortic stenosis is present.  6. Agitated saline contrast bubble study was positive with shunting observed within 3-6 cardiac cycles suggestive of interatrial shunt.  7. 3D performed of the mitral valve and demonstrates Small mobile echodensity on A2. Conclusion(s)/Recommendation(s): Small mobile echodensity on A2 segment of anterior mitral valve. Given clinical scenario, this is consistent with endocarditis. Findings communicated to primary and ID teams. FINDINGS  Left Ventricle: Left ventricular ejection fraction, by estimation, is 60 to 65%. The left ventricle has normal function. The left ventricular internal cavity size was normal in size. Right Ventricle: The right ventricular size is normal. Mildly increased right ventricular wall thickness. Right ventricular systolic function is normal. Left Atrium: Left atrial size was normal in size. No left atrial/left atrial appendage thrombus was detected. The LAA emptying velocity was 53 cm/s. Right Atrium: Right atrial size was normal in size. Pericardium: There is no evidence of pericardial effusion. Mitral Valve: There is a very small, mobile  echodensity on the anterior leaflet of the mitral valve consistent with vegetation (see images 10, 29, 35). The mitral valve is abnormal. Trivial mitral valve regurgitation.  No evidence of mitral valve stenosis. Tricuspid Valve: The tricuspid valve is normal in structure. Tricuspid valve regurgitation is trivial. No evidence of tricuspid stenosis. There is no evidence of tricuspid valve vegetation. Aortic Valve: The aortic valve is tricuspid. Aortic valve regurgitation is not visualized. No aortic stenosis is present. There is no evidence of aortic valve vegetation. Pulmonic Valve: The pulmonic valve was grossly normal. Pulmonic valve regurgitation is trivial. No evidence of pulmonic stenosis. There is no evidence of pulmonic valve vegetation. Aorta: The aortic root and ascending aorta are structurally normal, with no evidence of dilitation. IAS/Shunts: No atrial level shunt detected by color flow Doppler. Agitated saline contrast was given intravenously to evaluate for intracardiac shunting. Agitated saline contrast bubble study was positive with shunting observed within 3-6 cardiac cycles suggestive of interatrial shunt. Additional Comments: Spectral Doppler performed. LEFT VENTRICLE PLAX 2D LVOT diam:     2.10 cm LVOT Area:     3.46 cm   AORTA Ao Root diam: 2.70 cm  SHUNTS Systemic Diam: 2.10 cm Shelda Bruckner MD Electronically signed by Shelda Bruckner MD Signature Date/Time: 01/01/2024/3:27:23 PM    Final    EP STUDY Result Date: 01/01/2024 See surgical note for result.  Patient Profile    68 y.o. female with history of tobacco abuse and recent finding of MV endocarditis who we are asked to see today to discuss planning for MV surgery. She had a recent kidney stone treated with lithotripsy and right ureteral stent followed by stent removal at [facility] 3 weeks ago. She became febrile several days later and was admitted to [facility] in [place], [name] where she grew Candida from blood and urine cultures. 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 She is being followed by CT surgery and the ID team and planning is underway for mitral  valve replacement.  We are asked to see her to arrange a cardiac catheterization prior to her planned MV surgery.   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 Right and left heart cath tomorrow to exclude CAD and assess pressures prior to valve surgery.  Cath orders in place All questions answered   For questions or updates, please contact [facility] Please consult [email or web address] for contact info under     Signed, Bruckner Cash, MD  01/03/2024, 7:55 AM

## 2024-01-03 NOTE — Progress Notes (Addendum)
 PROGRESS NOTE  Tracy Baker FMW:[id number] DOB: 02/23/1956   PCP: Kristina Tinnie POUR, PA-C  Patient is from: Home  DOA: 12/28/2023 LOS: 2  Chief complaints Chief Complaint  Patient presents with   Candidemia     Brief Narrative / Interim history: 68 year old F with PMH of nephrolithiasis s/p ureteral stent and lithotripsy at [facility] on 10/10 with stent removal on 10/14 presented to ED after she left AMA from [facility] in [place].  Patient was hospitalized there with Candida albicans candidemia.  CT showed stranding around the right kidney and ureter.  Urology was consulted and she was started on Rocephin.  However, urine and blood cultures grew Candida albicans.  ID was consulted and she was started on Diflucan.  However, subsequent cultures continue to grow Candida albicans despite appropriate antibiotics raising concern for endocarditis.  TTE without significant finding.  On arrival here, hemodynamically stable.  Hgb 17 with HCT of 50.  Mildly elevated LFT.  Blood cultures obtained.  ID consulted.  She was started on micafungin.  TEE on 10/28 with small mobile echodensity on A2 segment of anterior mitral valve raising concern for mitral valve vegetation.  Cardiothoracic surgery consulted, and recommended dental evaluation and cardiac catheterization before MVR next week   Subjective: Seen and examined earlier this morning.  No major events overnight or this morning.  No complaints.   Assessment and plan: C. 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  TTE reportedly negative.  Blood cultures NGTD.  No eye involvement per ophthalmology.  TEE raises concern for mitral valve vegetation/endocarditis.  Orthopantogram with multiple dental caries and multiple missing mandibular maxillary teeth -Plan for MVR after dental evaluation and heart catheterization per CVTS. -Plan for LHC on 10/31. -Continue micafungin per ID   History of  nephrolithiasis s/p ureteral stent and lithotripsy on 10/10 and stent removal on 10/14 at [facility]  Transaminitis: Likely due to the above.  He was also on Diflucan.  Resolved.   Hypocalcemia corrects to normal for hypoalbuminemia.  Poor dentition/dental caries-orthopantogram as above. -Dental surgery consulted  Normocytic anemia: Stable -Continue monitoring   Hypokalemia -Monitor replenish K and Mg as appropriate  Body mass index is 26.45 kg/m.           DVT prophylaxis:  enoxaparin (LOVENOX) injection 40 mg Start: 12/29/23 1600  Code Status: Full code Family Communication: None at bedside Level of care: Med-Surg Status is: Inpatient Remains inpatient appropriate because: Fungal endocarditis   Final disposition: Home   35 minutes with more than 50% spent in reviewing records, counseling patient/family and coordinating care.  Consultants:  Infectious disease Cardiothoracic surgery Ophthalmology Cardiology Dental surgery  Procedures: 10/28-TEE  Microbiology summarized: 10/24-blood cultures NGTD  Objective: Vitals:   01/02/24 1617 01/02/24 1954 01/03/24 0446 01/03/24 0756  BP: (!) 144/67 (!) 116/51 (!) 141/70 135/66  Pulse:  65 64 65  Resp: 18 18 18 16   Temp: 98.1 F (36.7 C) 98.5 F (36.9 C) 98.3 F (36.8 C) 97.7 F (36.5 C)  TempSrc: Oral Oral Oral Oral  SpO2: 93% 96% 92% 94%  Weight:      Height:        Examination:  GENERAL: No apparent distress.  Nontoxic. HEENT: MMM.  Vision and hearing grossly intact.  NECK: Supple.  No apparent JVD.  RESP:  No IWOB.  Fair aeration bilaterally. CVS:  RRR. Heart sounds like mechanical valve ABD/GI/GU: BS+. Abd soft, NTND.  MSK/EXT:  Moves extremities. No apparent deformity. No edema.  SKIN: no  apparent skin lesion or wound NEURO: AA.  Oriented appropriately.  No apparent focal neuro deficit. PSYCH: Calm. Normal affect.   Sch Meds:  Scheduled Meds:  calcium carbonate  1 tablet Oral Q breakfast    enoxaparin (LOVENOX) injection  40 mg Subcutaneous Q24H   pantoprazole   20 mg Oral Daily   potassium chloride  40 mEq Oral Daily   Continuous Infusions:  micafungin (MYCAMINE) 150 mg in sodium chloride  0.9 % 100 mL IVPB 150 mg (01/03/24 1008)   PRN Meds:.acetaminophen **OR** acetaminophen, clonazePAM , ondansetron   Antimicrobials: Anti-infectives (From admission, onward)    Start     Dose/Rate Route Frequency Ordered Stop   12/29/23 1100  micafungin (MYCAMINE) 150 mg in sodium chloride  0.9 % 100 mL IVPB        150 mg 107.5 mL/hr over 1 Hours Intravenous Every 24 hours 12/29/23 1005     12/29/23 1000  fluconazole (DIFLUCAN) IVPB 400 mg  Status:  Discontinued        400 mg 100 mL/hr over 120 Minutes Intravenous Every 24 hours 12/29/23 0424 12/29/23 1005        I have personally reviewed the following labs and images: CBC: Recent Labs  Lab 12/28/23 1831 12/28/23 1950 12/30/23 0326 12/31/23 0501 01/01/24 0446 01/03/24 0342  WBC 8.6  --  6.8 6.1 7.4 5.5  NEUTROABS 5.9  --   --   --   --   --   HGB 11.9* 17.0* 10.4* 10.3* 10.7* 11.3*  HCT 36.5 50.0* 31.2* 31.7* 31.4* 33.4*  MCV 98.1  --  95.7 97.2 93.2 94.4  PLT 338  --  306 323 335 309   BMP &GFR Recent Labs  Lab 12/28/23 1831 12/28/23 1950 12/30/23 0326 12/31/23 0501 01/01/24 0446 01/03/24 0342  NA 136 138 139 138 137 139  K 3.3* 3.5 3.2* 3.4* 3.3* 4.0  CL 100 102 100 103 103 103  CO2 23  --  23 23 25 22   GLUCOSE 100* 99 111* 100* 115* 109*  BUN 11 13 10 9 10 13   CREATININE 0.66 0.60 0.58 0.64 0.64 0.71  CALCIUM 8.6*  --  8.1* 8.3* 8.4* 8.7*  MG  --   --   --   --   --  1.8  PHOS  --   --   --   --   --  3.6   Estimated Creatinine Clearance: 64.6 mL/min (by C-G formula based on SCr of 0.71 mg/dL). Liver & Pancreas: Recent Labs  Lab 12/28/23 1831 12/30/23 0326 12/31/23 0501 01/01/24 0446 01/03/24 0342  AST 47* 16 14* 12*  --   ALT 81* 44 35 29  --   ALKPHOS 366* 258* 305* 303*  --   BILITOT 1.1 1.0  0.8 0.8  --   PROT 7.2 6.4* 6.5 6.7  --   ALBUMIN 2.6* 2.1* 2.2* 2.2* 2.3*   No results for input(s): LIPASE, AMYLASE in the last 168 hours. No results for input(s): AMMONIA in the last 168 hours. Diabetic: No results for input(s): HGBA1C in the last 72 hours. No results for input(s): GLUCAP in the last 168 hours. Cardiac Enzymes: No results for input(s): CKTOTAL, CKMB, CKMBINDEX, TROPONINI in the last 168 hours. No results for input(s): PROBNP in the last 8760 hours. Coagulation Profile: No results for input(s): INR, PROTIME in the last 168 hours. Thyroid  Function Tests: No results for input(s): TSH, T4TOTAL, FREET4, T3FREE, THYROIDAB in the last 72 hours. Lipid Profile: No results for input(s): CHOL,  HDL, LDLCALC, TRIG, CHOLHDL, LDLDIRECT in the last 72 hours. Anemia Panel: No results for input(s): VITAMINB12, FOLATE, FERRITIN, TIBC, IRON, RETICCTPCT in the last 72 hours. Urine analysis:    Component Value Date/Time   COLORURINE YELLOW 12/28/2023 1920   APPEARANCEUR HAZY (A) 12/28/2023 1920   APPEARANCEUR Clear 06/08/2022 1530   LABSPEC 1.012 12/28/2023 1920   PHURINE 5.0 12/28/2023 1920   GLUCOSEU NEGATIVE 12/28/2023 1920   HGBUR MODERATE (A) 12/28/2023 1920   BILIRUBINUR NEGATIVE 12/28/2023 1920   BILIRUBINUR Negative 11/08/2023 0848   BILIRUBINUR Negative 06/08/2022 1530   KETONESUR NEGATIVE 12/28/2023 1920   PROTEINUR NEGATIVE 12/28/2023 1920   UROBILINOGEN 0.2 11/08/2023 0848   NITRITE NEGATIVE 12/28/2023 1920   LEUKOCYTESUR TRACE (A) 12/28/2023 1920   Sepsis Labs: Invalid input(s): PROCALCITONIN, LACTICIDVEN  Microbiology: Recent Results (from the past 240 hours)  Blood culture (routine x 2)     Status: None   Collection Time: 12/28/23  6:57 PM   Specimen: BLOOD  Result Value Ref Range Status   Specimen Description BLOOD RIGHT ANTECUBITAL  Final   Special Requests   Final    BOTTLES DRAWN AEROBIC  AND ANAEROBIC Blood Culture adequate volume   Culture   Final    NO GROWTH 5 DAYS Performed at [facility], [address] [place], [place] [address]    Report Status 01/02/2024 FINAL  Final  Blood culture (routine x 2)     Status: None   Collection Time: 12/28/23  7:06 PM   Specimen: BLOOD RIGHT HAND  Result Value Ref Range Status   Specimen Description BLOOD RIGHT HAND  Final   Special Requests   Final    BOTTLES DRAWN AEROBIC AND ANAEROBIC Blood Culture adequate volume   Culture   Final    NO GROWTH 5 DAYS Performed at [facility], [address] [place], [place] [address]    Report Status 01/02/2024 FINAL  Final  Urine Culture     Status: None   Collection Time: 12/29/23  8:37 AM   Specimen: Urine, Clean Catch  Result Value Ref Range Status   Specimen Description URINE, CLEAN CATCH  Final   Special Requests NONE  Final   Culture   Final    NO GROWTH Performed at [facility], [address], [place], [place] [address]    Report Status 12/30/2023 FINAL  Final    Radiology Studies: No results found.     Bria Portales T. Jonte Wollam [facility]  If 7PM-7AM, please contact night-coverage [email or web address] 01/03/2024, 1:56 PM

## 2024-01-03 NOTE — Plan of Care (Signed)

## 2024-01-04 ENCOUNTER — Encounter (HOSPITAL_COMMUNITY)
Admission: EM | Disposition: A | Discharge status: Rehabilitation Facility | Payer: Self-pay | Source: Home / Self Care | Attending: Thoracic Surgery (Cardiothoracic Vascular Surgery)

## 2024-01-04 ENCOUNTER — Telehealth: Payer: Self-pay | Admitting: Physician Assistant

## 2024-01-04 DIAGNOSIS — I330 Acute and subacute infective endocarditis: Secondary | ICD-10-CM | POA: Diagnosis not present

## 2024-01-04 DIAGNOSIS — I059 Rheumatic mitral valve disease, unspecified: Secondary | ICD-10-CM | POA: Diagnosis not present

## 2024-01-04 DIAGNOSIS — B377 Candidal sepsis: Secondary | ICD-10-CM | POA: Diagnosis not present

## 2024-01-04 HISTORY — PX: RIGHT/LEFT HEART CATH AND CORONARY ANGIOGRAPHY: CATH118266

## 2024-01-04 LAB — POCT I-STAT 7, (LYTES, BLD GAS, ICA,H+H)
Acid-Base Excess: 0 mmol/L (ref 0.0–2.0)
Acid-Base Excess: 2 mmol/L (ref 0.0–2.0)
Bicarbonate: 23.7 mmol/L (ref 20.0–28.0)
Bicarbonate: 27 mmol/L (ref 20.0–28.0)
Calcium, Ion: 1.12 mmol/L — ABNORMAL LOW (ref 1.15–1.40)
Calcium, Ion: 1.26 mmol/L (ref 1.15–1.40)
HCT: 30 % — ABNORMAL LOW (ref 36.0–46.0)
HCT: 32 % — ABNORMAL LOW (ref 36.0–46.0)
Hemoglobin: 10.2 g/dL — ABNORMAL LOW (ref 12.0–15.0)
Hemoglobin: 10.9 g/dL — ABNORMAL LOW (ref 12.0–15.0)
O2 Saturation: 97 %
O2 Saturation: 68 %
Potassium: 4 mmol/L (ref 3.5–5.1)
Potassium: 4.3 mmol/L (ref 3.5–5.1)
Sodium: 143 mmol/L (ref 135–145)
Sodium: 140 mmol/L (ref 135–145)
TCO2: 25 mmol/L (ref 22–32)
TCO2: 28 mmol/L (ref 22–32)
pCO2 arterial: 34.9 mmHg (ref 32–48)
pCO2 arterial: 43 mmHg (ref 32–48)
pH, Arterial: 7.44 (ref 7.35–7.45)
pH, Arterial: 7.407 (ref 7.35–7.45)
pO2, Arterial: 85 mmHg (ref 83–108)
pO2, Arterial: 36 mmHg — CL (ref 83–108)

## 2024-01-04 LAB — RENAL FUNCTION PANEL
Albumin: 2.4 g/dL — ABNORMAL LOW (ref 3.5–5.0)
Anion gap: 13 (ref 5–15)
BUN: 18 mg/dL (ref 8–23)
CO2: 23 mmol/L (ref 22–32)
Calcium: 9 mg/dL (ref 8.9–10.3)
Chloride: 103 mmol/L (ref 98–111)
Creatinine, Ser: 0.63 mg/dL (ref 0.44–1.00)
GFR, Estimated: >60 mL/min (ref >60)
Glucose, Bld: 127 mg/dL — ABNORMAL HIGH (ref 70–99)
Phosphorus: 3.9 mg/dL (ref 2.5–4.6)
Potassium: 3.9 mmol/L (ref 3.5–5.1)
Sodium: 139 mmol/L (ref 135–145)

## 2024-01-04 LAB — POCT I-STAT EG7
Acid-Base Excess: 3 mmol/L — ABNORMAL HIGH (ref 0.0–2.0)
Bicarbonate: 27.7 mmol/L (ref 20.0–28.0)
Calcium, Ion: 1.2 mmol/L (ref 1.15–1.40)
HCT: 32 % — ABNORMAL LOW (ref 36.0–46.0)
Hemoglobin: 10.9 g/dL — ABNORMAL LOW (ref 12.0–15.0)
O2 Saturation: 67 %
Potassium: 4.2 mmol/L (ref 3.5–5.1)
Sodium: 141 mmol/L (ref 135–145)
TCO2: 29 mmol/L (ref 22–32)
pCO2, Ven: 42.4 mmHg — ABNORMAL LOW (ref 44–60)
pH, Ven: 7.423 (ref 7.25–7.43)
pO2, Ven: 34 mmHg (ref 32–45)

## 2024-01-04 LAB — CBC
HCT: 33.4 % — ABNORMAL LOW (ref 36.0–46.0)
Hemoglobin: 11.2 g/dL — ABNORMAL LOW (ref 12.0–15.0)
MCH: 31.7 pg (ref 26.0–34.0)
MCHC: 33.5 g/dL (ref 30.0–36.0)
MCV: 94.6 fL (ref 80.0–100.0)
Platelets: 310 K/uL (ref 150–400)
RBC: 3.53 MIL/uL — ABNORMAL LOW (ref 3.87–5.11)
RDW: 12 % (ref 11.5–15.5)
WBC: 5.8 K/uL (ref 4.0–10.5)
nRBC: 0 % (ref 0.0–0.2)

## 2024-01-04 LAB — MAGNESIUM: Magnesium: 1.8 mg/dL (ref 1.7–2.4)

## 2024-01-04 SURGERY — RIGHT/LEFT HEART CATH AND CORONARY ANGIOGRAPHY
Anesthesia: LOCAL

## 2024-01-04 MED ORDER — FENTANYL CITRATE (PF) 100 MCG/2ML IJ SOLN
INTRAMUSCULAR | Status: DC | PRN
  Administered 2024-01-04: 50 ug via INTRAVENOUS

## 2024-01-04 MED ORDER — FENTANYL CITRATE (PF) 100 MCG/2ML IJ SOLN
INTRAMUSCULAR | Status: AC
  Filled 2024-01-04: qty 2

## 2024-01-04 MED ORDER — FREE WATER
500.0000 mL | Freq: Once | Status: AC
  Administered 2024-01-04: 500 mL via ORAL

## 2024-01-04 MED ORDER — FREE WATER: 500.0000 mL | Freq: Once | Status: DC

## 2024-01-04 MED ORDER — HEPARIN SODIUM (PORCINE) 1000 UNIT/ML IJ SOLN
INTRAMUSCULAR | Status: AC
  Filled 2024-01-04: qty 10

## 2024-01-04 MED ORDER — HEPARIN (PORCINE) IN NACL 1000-0.9 UT/500ML-% IV SOLN
INTRAVENOUS | Status: DC | PRN
  Administered 2024-01-04 (×2): 500 mL

## 2024-01-04 MED ORDER — VERAPAMIL HCL 2.5 MG/ML IV SOLN
INTRAVENOUS | Status: DC | PRN
  Administered 2024-01-04: 10 mL via INTRA_ARTERIAL

## 2024-01-04 MED ORDER — SODIUM CHLORIDE 0.9% FLUSH: 3.0000 mL | INTRAVENOUS | Status: DC | PRN

## 2024-01-04 MED ORDER — ASPIRIN 81 MG PO CHEW: 81.0000 mg | CHEWABLE_TABLET | ORAL | Status: DC

## 2024-01-04 MED ORDER — LIDOCAINE HCL (PF) 1 % IJ SOLN
INTRAMUSCULAR | Status: AC
  Filled 2024-01-04: qty 30

## 2024-01-04 MED ORDER — VERAPAMIL HCL 2.5 MG/ML IV SOLN
INTRAVENOUS | Status: AC
  Filled 2024-01-04: qty 2

## 2024-01-04 MED ORDER — MIDAZOLAM HCL (PF) 2 MG/2ML IJ SOLN
INTRAMUSCULAR | Status: DC | PRN
Start: 2024-01-04 — End: 2024-01-04
  Administered 2024-01-04: 2 mg via INTRAVENOUS

## 2024-01-04 MED ORDER — ASPIRIN 81 MG PO CHEW
81.0000 mg | CHEWABLE_TABLET | ORAL | Status: AC
  Administered 2024-01-04: 81 mg via ORAL
  Filled 2024-01-04: qty 1

## 2024-01-04 MED ORDER — LABETALOL HCL 5 MG/ML IV SOLN: 10.0000 mg | INTRAVENOUS | Status: AC | PRN

## 2024-01-04 MED ORDER — LIDOCAINE HCL (PF) 1 % IJ SOLN
INTRAMUSCULAR | Status: DC | PRN
  Administered 2024-01-04: 2 mL via INTRADERMAL

## 2024-01-04 MED ORDER — HYDRALAZINE HCL 20 MG/ML IJ SOLN: 10.0000 mg | INTRAMUSCULAR | Status: AC | PRN

## 2024-01-04 MED ORDER — MIDAZOLAM HCL 2 MG/2ML IJ SOLN
INTRAMUSCULAR | Status: AC
Start: 2024-01-04 — End: 2024-01-04
  Filled 2024-01-04: qty 2

## 2024-01-04 MED ORDER — IOHEXOL 350 MG/ML SOLN
INTRAVENOUS | Status: DC | PRN
  Administered 2024-01-04: 30 mL

## 2024-01-04 MED ORDER — SODIUM CHLORIDE 0.9 % IV SOLN: 250.0000 mL | INTRAVENOUS | Status: AC | PRN

## 2024-01-04 MED ORDER — SODIUM CHLORIDE 0.9% FLUSH
3.0000 mL | Freq: Two times a day (BID) | INTRAVENOUS | Status: DC
  Administered 2024-01-04 – 2024-01-09 (×11): 3 mL via INTRAVENOUS

## 2024-01-04 SURGICAL SUPPLY — 8 items
CATH 5FR JL3.5 JR4 ANG PIG MP (CATHETERS) IMPLANT
CATH BALLN WEDGE 5F 110CM (CATHETERS) IMPLANT
DEVICE RAD TR BAND REGULAR (VASCULAR PRODUCTS) IMPLANT
GLIDESHEATH SLEND SS 6F .021 (SHEATH) IMPLANT
GUIDEWIRE INQWIRE 1.5J.035X260 (WIRE) IMPLANT
KIT HEART LEFT (KITS) IMPLANT
PACK CARDIAC CATHETERIZATION (CUSTOM PROCEDURE TRAY) ×1 IMPLANT
SHEATH GLIDE SLENDER 4/5FR (SHEATH) IMPLANT

## 2024-01-04 NOTE — Progress Notes (Signed)
   01/04/24 1200  Spiritual Encounters  Type of Visit Initial  Care provided to: Patient  Referral source Patient request  Reason for visit Trauma  OnCall Visit No   Patient requested prayer, I offered prayer and comfort and informed patient we remain available upon request.

## 2024-01-04 NOTE — H&P (View-Only) (Signed)
 Rounding Note   Patient Name: Tracy Baker Date of Encounter: 01/04/2024  [facility] Cardiologist: None   Subjective  No events overnight.  No chest pain or dyspnea  Scheduled Meds:  calcium carbonate  1 tablet Oral Q breakfast   enoxaparin (LOVENOX) injection  40 mg Subcutaneous Q24H   free water  500 mL Oral Once   pantoprazole   20 mg Oral Daily   potassium chloride  40 mEq Oral Daily   Continuous Infusions:  micafungin (MYCAMINE) 150 mg in sodium chloride  0.9 % 100 mL IVPB 150 mg (01/03/24 1008)   PRN Meds: acetaminophen **OR** acetaminophen, clonazePAM , ondansetron    Vital Signs  Vitals:   01/03/24 2030 01/04/24 0500 01/04/24 0600 01/04/24 0822  BP: 131/70 (!) 152/65 (!) 152/65 129/62  Pulse: 67 72 72 64  Resp: 16 16 16 16   Temp: 98.2 F (36.8 C) 98.3 F (36.8 C)  97.9 F (36.6 C)  TempSrc: Oral Oral  Oral  SpO2: 96% 95% 94% 97%  Weight:      Height:       No intake or output data in the 24 hours ending 01/04/24 0855     12/29/2023    4:00 AM 11/08/2023    8:37 AM 07/09/2023    8:36 AM  Last 3 Weights  Weight (lbs) 154 lb 1.6 oz 159 lb 160 lb  Weight (kg) 69.9 kg 72.122 kg 72.576 kg      Telemetry  ECG  No EKG- I have ordered one for today  Physical Exam  General: Well developed, well nourished, NAD  HEENT: OP clear, mucus membranes moist  SKIN: warm, dry. No rashes. Neuro: No focal deficits  Psychiatric: Mood and affect normal  Neck: No JVD Lungs:Clear bilaterally Cardiovascular: Regular rate and rhythm. No murmurs Extremities: No lower extremity edema.   Labs High Sensitivity Troponin:  No results for input(s): TROPONINIHS in the last 720 hours.   Chemistry Recent Labs  Lab 12/30/23 0326 12/31/23 0501 01/01/24 0446 01/03/24 0342 01/04/24 0354  NA 139 138 137 139 139  K 3.2* 3.4* 3.3* 4.0 3.9  CL 100 103 103 103 103  CO2 23 23 25 22 23   GLUCOSE 111* 100* 115* 109* 127*  BUN 10 9 10 13 18   CREATININE 0.58 0.64  0.64 0.71 0.63  CALCIUM 8.1* 8.3* 8.4* 8.7* 9.0  MG  --   --   --  1.8 1.8  PROT 6.4* 6.5 6.7  --   --   ALBUMIN 2.1* 2.2* 2.2* 2.3* 2.4*  AST 16 14* 12*  --   --   ALT 44 35 29  --   --   ALKPHOS 258* 305* 303*  --   --   BILITOT 1.0 0.8 0.8  --   --   GFRNONAA >60 >60 >60 >60 >60  ANIONGAP 16* 12 9 14 13     Lipids No results for input(s): CHOL, TRIG, HDL, LABVLDL, LDLCALC, CHOLHDL in the last 168 hours.  Hematology Recent Labs  Lab 01/01/24 0446 01/03/24 0342 01/04/24 0354  WBC 7.4 5.5 5.8  RBC 3.37* 3.54* 3.53*  HGB 10.7* 11.3* 11.2*  HCT 31.4* 33.4* 33.4*  MCV 93.2 94.4 94.6  MCH 31.8 31.9 31.7  MCHC 34.1 33.8 33.5  RDW 12.3 12.0 12.0  PLT 335 309 310   Thyroid  No results for input(s): TSH, FREET4 in the last 168 hours.  BNPNo results for input(s): BNP, PROBNP in the last 168 hours.  DDimer No results for input(s):  DDIMER in the last 168 hours.   Radiology  VAS US  CAROTID Result Date: 01/03/2024 Carotid Arterial Duplex Study Patient Name:  [name]  Date of Exam:   01/02/2024 Medical Rec #: [id number]       Accession #:    [id number] Date of Birth: April 20, 1955       Patient Gender: F Patient Age:   68 years Exam Location:  [facility] Procedure:      VAS US  CAROTID Referring Phys: ELSPETH HENDRICKSON --------------------------------------------------------------------------------  Indications:       Pre operative: mitral valve fungal endocarditis. Comparison Study:  Prior near normal carotid duplex done at [facility] 07/08/21 Performing Technologist: Alberta Lis RVS  Examination Guidelines: A complete evaluation includes B-mode imaging, spectral Doppler, color Doppler, and power Doppler as needed of all accessible portions of each vessel. Bilateral testing is considered an integral part of a complete examination. Limited examinations for reoccurring indications may be performed as noted.  Right Carotid Findings:  +----------+--------+--------+--------+---------------------+------------------+           PSV cm/sEDV cm/sStenosisPlaque Description   Comments           +----------+--------+--------+--------+---------------------+------------------+ CCA Prox  96      20                                                      +----------+--------+--------+--------+---------------------+------------------+ CCA Distal72      22                                   intimal thickening +----------+--------+--------+--------+---------------------+------------------+ ICA Prox  117     37      1-39%   irregular and                                                             calcific                                +----------+--------+--------+--------+---------------------+------------------+ ICA Mid   108     36                                                      +----------+--------+--------+--------+---------------------+------------------+ ICA Distal94      26                                                      +----------+--------+--------+--------+---------------------+------------------+ ECA       95      11                                                      +----------+--------+--------+--------+---------------------+------------------+ +----------+--------+-------+--------+-------------------+  PSV cm/sEDV cmsDescribeArm Pressure (mmHG) +----------+--------+-------+--------+-------------------+ Subclavian109                                        +----------+--------+-------+--------+-------------------+ +---------+--------+--+--------+--+ VertebralPSV cm/s51EDV cm/s15 +---------+--------+--+--------+--+  Left Carotid Findings: +----------+--------+--------+--------+------------------+------------------+           PSV cm/sEDV cm/sStenosisPlaque DescriptionComments            +----------+--------+--------+--------+------------------+------------------+ CCA Prox  113     23                                intimal thickening +----------+--------+--------+--------+------------------+------------------+ CCA Distal98      28                                intimal thickening +----------+--------+--------+--------+------------------+------------------+ ICA Prox  108     29      1-39%   heterogenous                         +----------+--------+--------+--------+------------------+------------------+ ICA Distal128     33                                                   +----------+--------+--------+--------+------------------+------------------+ ECA       91      12                                                   +----------+--------+--------+--------+------------------+------------------+ +----------+--------+--------+--------+-------------------+           PSV cm/sEDV cm/sDescribeArm Pressure (mmHG) +----------+--------+--------+--------+-------------------+ Dlarojcpjw830                                         +----------+--------+--------+--------+-------------------+ +---------+--------+--+--------+--+ VertebralPSV cm/s56EDV cm/s17 +---------+--------+--+--------+--+   Summary: Right Carotid: Velocities in the right ICA are consistent with a 1-39% stenosis. Left Carotid: Velocities in the left ICA are consistent with a 1-39% stenosis. Vertebrals:  Bilateral vertebral arteries demonstrate antegrade flow. Subclavians: Normal flow hemodynamics were seen in bilateral subclavian              arteries. *See table(s) above for measurements and observations.  Electronically signed by Debby Robertson on 01/03/2024 at 7:52:43 PM.    Final    DG Orthopantogram Result Date: 01/02/2024 EXAM: PAMALEE SPRINKLE, PANOREX VIEW 01/02/2024 09:38:00 AM TECHNIQUE: Panorex view of the mandible. COMPARISON: None available. CLINICAL HISTORY: At risk for  dental caries 8364307. Pre cardiac surgery work-up. Poor dentition; Mitral valve. FINDINGS: DENTAL: Multiple missing mandibular and maxillary teeth. Multiple dental caries. BONES: No acute fracture or focal osseous lesion. TMJ: No dislocation. SOFT TISSUES: The soft tissues are unremarkable. IMPRESSION: 1. Multiple missing mandibular and maxillary teeth. 2. Multiple dental caries. Electronically signed by: Franky Crease MD 01/02/2024 01:41 PM EDT RP Workstation: [id number]   Patient Profile    68 y.o. female with history of tobacco abuse and recent finding of MV endocarditis who we were  asked to see to discuss planning for MV surgery. She had a recent kidney stone treated with lithotripsy and right ureteral stent followed by stent removal at [facility] 3 weeks ago. She became febrile several days later and was admitted to [facility] in [place], [name] where she grew Candida from blood and urine cultures. 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 She is being followed by CT surgery and the ID team and planning is underway for mitral valve replacement.   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 Right and left heart cath today to exclude CAD and assess pressures prior to valve surgery.   I have reviewed the risks, indications, and alternatives to cardiac catheterization, possible angioplasty, and stenting with the patient. Risks include but are not limited to bleeding, infection, vascular injury, stroke, myocardial infection, arrhythmia, kidney injury, radiation-related injury in the case of prolonged fluoroscopy use, emergency cardiac surgery, and death. The patient understands the risks of serious complication is 1-2 in 1000 with diagnostic cardiac cath and 1-2% or less with angioplasty/stenting.    For questions or updates, please contact [facility] Please consult [email or web address] for contact info under     Signed, Lonni Cash, MD  01/04/2024, 8:55 AM

## 2024-01-04 NOTE — Plan of Care (Signed)
  Problem: Education: Goal: Knowledge of General Education information will improve Description: Including pain rating scale, medication(s)/side effects and non-pharmacologic comfort measures Outcome: Progressing   Problem: Clinical Measurements: Goal: Diagnostic test results will improve Outcome: Progressing   Problem: Clinical Measurements: Goal: Cardiovascular complication will be avoided Outcome: Progressing   Problem: Activity: Goal: Risk for activity intolerance will decrease Outcome: Progressing   Problem: Nutrition: Goal: Adequate nutrition will be maintained Outcome: Progressing   Problem: Coping: Goal: Level of anxiety will decrease Outcome: Progressing   Problem: Elimination: Goal: Will not experience complications related to bowel motility Outcome: Progressing   Problem: Elimination: Goal: Will not experience complications related to urinary retention Outcome: Progressing   Problem: Safety: Goal: Ability to remain free from injury will improve Outcome: Progressing

## 2024-01-04 NOTE — Progress Notes (Signed)
 Rounding Note   Patient Name: Tracy Baker Date of Encounter: 01/04/2024  [facility] Cardiologist: None   Subjective  No events overnight.  No chest pain or dyspnea  Scheduled Meds:  calcium carbonate  1 tablet Oral Q breakfast   enoxaparin (LOVENOX) injection  40 mg Subcutaneous Q24H   free water  500 mL Oral Once   pantoprazole   20 mg Oral Daily   potassium chloride  40 mEq Oral Daily   Continuous Infusions:  micafungin (MYCAMINE) 150 mg in sodium chloride  0.9 % 100 mL IVPB 150 mg (01/03/24 1008)   PRN Meds: acetaminophen **OR** acetaminophen, clonazePAM , ondansetron    Vital Signs  Vitals:   01/03/24 2030 01/04/24 0500 01/04/24 0600 01/04/24 0822  BP: 131/70 (!) 152/65 (!) 152/65 129/62  Pulse: 67 72 72 64  Resp: 16 16 16 16   Temp: 98.2 F (36.8 C) 98.3 F (36.8 C)  97.9 F (36.6 C)  TempSrc: Oral Oral  Oral  SpO2: 96% 95% 94% 97%  Weight:      Height:       No intake or output data in the 24 hours ending 01/04/24 0855     12/29/2023    4:00 AM 11/08/2023    8:37 AM 07/09/2023    8:36 AM  Last 3 Weights  Weight (lbs) 154 lb 1.6 oz 159 lb 160 lb  Weight (kg) 69.9 kg 72.122 kg 72.576 kg      Telemetry  ECG  No EKG- I have ordered one for today  Physical Exam  General: Well developed, well nourished, NAD  HEENT: OP clear, mucus membranes moist  SKIN: warm, dry. No rashes. Neuro: No focal deficits  Psychiatric: Mood and affect normal  Neck: No JVD Lungs:Clear bilaterally Cardiovascular: Regular rate and rhythm. No murmurs Extremities: No lower extremity edema.   Labs High Sensitivity Troponin:  No results for input(s): TROPONINIHS in the last 720 hours.   Chemistry Recent Labs  Lab 12/30/23 0326 12/31/23 0501 01/01/24 0446 01/03/24 0342 01/04/24 0354  NA 139 138 137 139 139  K 3.2* 3.4* 3.3* 4.0 3.9  CL 100 103 103 103 103  CO2 23 23 25 22 23   GLUCOSE 111* 100* 115* 109* 127*  BUN 10 9 10 13 18   CREATININE 0.58 0.64  0.64 0.71 0.63  CALCIUM 8.1* 8.3* 8.4* 8.7* 9.0  MG  --   --   --  1.8 1.8  PROT 6.4* 6.5 6.7  --   --   ALBUMIN 2.1* 2.2* 2.2* 2.3* 2.4*  AST 16 14* 12*  --   --   ALT 44 35 29  --   --   ALKPHOS 258* 305* 303*  --   --   BILITOT 1.0 0.8 0.8  --   --   GFRNONAA >60 >60 >60 >60 >60  ANIONGAP 16* 12 9 14 13     Lipids No results for input(s): CHOL, TRIG, HDL, LABVLDL, LDLCALC, CHOLHDL in the last 168 hours.  Hematology Recent Labs  Lab 01/01/24 0446 01/03/24 0342 01/04/24 0354  WBC 7.4 5.5 5.8  RBC 3.37* 3.54* 3.53*  HGB 10.7* 11.3* 11.2*  HCT 31.4* 33.4* 33.4*  MCV 93.2 94.4 94.6  MCH 31.8 31.9 31.7  MCHC 34.1 33.8 33.5  RDW 12.3 12.0 12.0  PLT 335 309 310   Thyroid  No results for input(s): TSH, FREET4 in the last 168 hours.  BNPNo results for input(s): BNP, PROBNP in the last 168 hours.  DDimer No results for input(s):  DDIMER in the last 168 hours.   Radiology  VAS US  CAROTID Result Date: 01/03/2024 Carotid Arterial Duplex Study Patient Name:  Tracy Baker  Date of Exam:   01/02/2024 Medical Rec #: [id number]       Accession #:    [id number] Date of Birth: April 20, 1955       Patient Gender: F Patient Age:   68 years Exam Location:  [facility] Procedure:      VAS US  CAROTID Referring Phys: ELSPETH HENDRICKSON --------------------------------------------------------------------------------  Indications:       Pre operative: mitral valve fungal endocarditis. Comparison Study:  Prior near normal carotid duplex done at [facility] 07/08/21 Performing Technologist: Alberta Lis RVS  Examination Guidelines: A complete evaluation includes B-mode imaging, spectral Doppler, color Doppler, and power Doppler as needed of all accessible portions of each vessel. Bilateral testing is considered an integral part of a complete examination. Limited examinations for reoccurring indications may be performed as noted.  Right Carotid Findings:  +----------+--------+--------+--------+---------------------+------------------+           PSV cm/sEDV cm/sStenosisPlaque Description   Comments           +----------+--------+--------+--------+---------------------+------------------+ CCA Prox  96      20                                                      +----------+--------+--------+--------+---------------------+------------------+ CCA Distal72      22                                   intimal thickening +----------+--------+--------+--------+---------------------+------------------+ ICA Prox  117     37      1-39%   irregular and                                                             calcific                                +----------+--------+--------+--------+---------------------+------------------+ ICA Mid   108     36                                                      +----------+--------+--------+--------+---------------------+------------------+ ICA Distal94      26                                                      +----------+--------+--------+--------+---------------------+------------------+ ECA       95      11                                                      +----------+--------+--------+--------+---------------------+------------------+ +----------+--------+-------+--------+-------------------+  PSV cm/sEDV cmsDescribeArm Pressure (mmHG) +----------+--------+-------+--------+-------------------+ Subclavian109                                        +----------+--------+-------+--------+-------------------+ +---------+--------+--+--------+--+ VertebralPSV cm/s51EDV cm/s15 +---------+--------+--+--------+--+  Left Carotid Findings: +----------+--------+--------+--------+------------------+------------------+           PSV cm/sEDV cm/sStenosisPlaque DescriptionComments            +----------+--------+--------+--------+------------------+------------------+ CCA Prox  113     23                                intimal thickening +----------+--------+--------+--------+------------------+------------------+ CCA Distal98      28                                intimal thickening +----------+--------+--------+--------+------------------+------------------+ ICA Prox  108     29      1-39%   heterogenous                         +----------+--------+--------+--------+------------------+------------------+ ICA Distal128     33                                                   +----------+--------+--------+--------+------------------+------------------+ ECA       91      12                                                   +----------+--------+--------+--------+------------------+------------------+ +----------+--------+--------+--------+-------------------+           PSV cm/sEDV cm/sDescribeArm Pressure (mmHG) +----------+--------+--------+--------+-------------------+ Dlarojcpjw830                                         +----------+--------+--------+--------+-------------------+ +---------+--------+--+--------+--+ VertebralPSV cm/s56EDV cm/s17 +---------+--------+--+--------+--+   Summary: Right Carotid: Velocities in the right ICA are consistent with a 1-39% stenosis. Left Carotid: Velocities in the left ICA are consistent with a 1-39% stenosis. Vertebrals:  Bilateral vertebral arteries demonstrate antegrade flow. Subclavians: Normal flow hemodynamics were seen in bilateral subclavian              arteries. *See table(s) above for measurements and observations.  Electronically signed by Debby Robertson on 01/03/2024 at 7:52:43 PM.    Final    DG Orthopantogram Result Date: 01/02/2024 EXAM: PAMALEE SPRINKLE, PANOREX VIEW 01/02/2024 09:38:00 AM TECHNIQUE: Panorex view of the mandible. COMPARISON: None available. CLINICAL HISTORY: At risk for  dental caries 8364307. Pre cardiac surgery work-up. Poor dentition; Mitral valve. FINDINGS: DENTAL: Multiple missing mandibular and maxillary teeth. Multiple dental caries. BONES: No acute fracture or focal osseous lesion. TMJ: No dislocation. SOFT TISSUES: The soft tissues are unremarkable. IMPRESSION: 1. Multiple missing mandibular and maxillary teeth. 2. Multiple dental caries. Electronically signed by: Franky Crease MD 01/02/2024 01:41 PM EDT RP Workstation: [id number]   Patient Profile    68 y.o. female with history of tobacco abuse and recent finding of MV endocarditis who we were  asked to see to discuss planning for MV surgery. She had a recent kidney stone treated with lithotripsy and right ureteral stent followed by stent removal at [facility] 3 weeks ago. She became febrile several days later and was admitted to [facility] in [place], [name] where she grew Candida from blood and urine cultures. 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 She is being followed by CT surgery and the ID team and planning is underway for mitral valve replacement.   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 Right and left heart cath today to exclude CAD and assess pressures prior to valve surgery.   I have reviewed the risks, indications, and alternatives to cardiac catheterization, possible angioplasty, and stenting with the patient. Risks include but are not limited to bleeding, infection, vascular injury, stroke, myocardial infection, arrhythmia, kidney injury, radiation-related injury in the case of prolonged fluoroscopy use, emergency cardiac surgery, and death. The patient understands the risks of serious complication is 1-2 in 1000 with diagnostic cardiac cath and 1-2% or less with angioplasty/stenting.    For questions or updates, please contact [facility] Please consult [email or web address] for contact info under     Signed, Lonni Cash, MD  01/04/2024, 8:55 AM

## 2024-01-04 NOTE — Care Management Important Message (Signed)
 Important Message  Patient Details  Name: Tracy Baker MRN: [id number] Date of Birth: [birth year]   Important Message Given:  Yes - Medicare IM     Claretta Deed 01/04/2024, 1:40 PM

## 2024-01-04 NOTE — Telephone Encounter (Signed)
 Contacted patient to schedule HF for candidal sepsis. Stated she is still in hospital, even though ping shows her discharged. She also stated she is having surgery 01/10/24-Toni

## 2024-01-04 NOTE — Interval H&P Note (Signed)
 History and Physical Interval Note:  01/04/2024 1:43 PM  Tracy Baker  has presented today for surgery, with the diagnosis of endocarditis.  The various methods of treatment have been discussed with the patient and family. After consideration of risks, benefits and other options for treatment, the patient has consented to  Procedure(s): LEFT HEART CATH AND CORONARY ANGIOGRAPHY (N/A) as a surgical intervention.  The patient's history has been reviewed, patient examined, no change in status, stable for surgery.  I have reviewed the patient's chart and labs.  Questions were answered to the patient's satisfaction.    Cath Lab Visit (complete for each Cath Lab visit)  Clinical Evaluation Leading to the Procedure:   ACS: No.  Non-ACS:    Anginal Classification: No Symptoms  Anti-ischemic medical therapy: No Therapy  Non-Invasive Test Results: No non-invasive testing performed  Prior CABG: No previous CABG        Lonni Cash

## 2024-01-04 NOTE — Plan of Care (Signed)

## 2024-01-04 NOTE — Progress Notes (Signed)
 Tracy Baker PROGRESS NOTE:   SUBJECTIVE: No oral pain, swelling, hot or cold sensitivity.  OBJECTIVE:  Vitals: Blood pressure 129/62, pulse 64, temperature 97.9 F (36.6 C), temperature source Oral, resp. rate 16, height 5' 4 (1.626 m), weight 69.9 kg, SpO2 97%. Lab results: Results for orders placed or performed during the hospital encounter of 12/28/23 (from the past 24 hours)  Renal function panel     Status: Abnormal   Collection Time: 01/04/24  3:54 AM  Result Value Ref Range   Sodium 139 135 - 145 mmol/L   Potassium 3.9 3.5 - 5.1 mmol/L   Chloride 103 98 - 111 mmol/L   CO2 23 22 - 32 mmol/L   Glucose, Bld 127 (H) 70 - 99 mg/dL   BUN 18 8 - 23 mg/dL   Creatinine, Ser 9.36 0.44 - 1.00 mg/dL   Calcium 9.0 8.9 - 89.6 mg/dL   Phosphorus 3.9 2.5 - 4.6 mg/dL   Albumin 2.4 (L) 3.5 - 5.0 g/dL   GFR, Estimated >39 >39 mL/min   Anion gap 13 5 - 15  Magnesium     Status: None   Collection Time: 01/04/24  3:54 AM  Result Value Ref Range   Magnesium 1.8 1.7 - 2.4 mg/dL  CBC     Status: Abnormal   Collection Time: 01/04/24  3:54 AM  Result Value Ref Range   WBC 5.8 4.0 - 10.5 K/uL   RBC 3.53 (L) 3.87 - 5.11 MIL/uL   Hemoglobin 11.2 (L) 12.0 - 15.0 g/dL   HCT 66.5 (L) 63.9 - 53.9 %   MCV 94.6 80.0 - 100.0 fL   MCH 31.7 26.0 - 34.0 pg   MCHC 33.5 30.0 - 36.0 g/dL   RDW 87.9 88.4 - 84.4 %   Platelets 310 150 - 400 K/uL   nRBC 0.0 0.0 - 0.2 %   Radiology Results: VAS US  CAROTID Result Date: 01/03/2024 Carotid Arterial Duplex Study Patient Name:  Tracy Baker  Date of Exam:   01/02/2024 Medical Rec #: [id number]       Accession #:    [id number] Date of Birth: [birth year]       Patient Gender: F Patient Age:   68 years Exam Location:  [facility] Procedure:      VAS US  CAROTID Referring Phys: ELSPETH HENDRICKSON --------------------------------------------------------------------------------  Indications:       Pre operative: mitral valve fungal endocarditis. Comparison  Study:  Prior near normal carotid duplex done at [facility] 07/08/21 Performing Technologist: Alberta Lis RVS  Examination Guidelines: A complete evaluation includes B-mode imaging, spectral Doppler, color Doppler, and power Doppler as needed of all accessible portions of each vessel. Bilateral testing is considered an integral part of a complete examination. Limited examinations for reoccurring indications may be performed as noted.  Right Carotid Findings: +----------+--------+--------+--------+---------------------+------------------+           PSV cm/sEDV cm/sStenosisPlaque Description   Comments           +----------+--------+--------+--------+---------------------+------------------+ CCA Prox  96      20                                                      +----------+--------+--------+--------+---------------------+------------------+ CCA Distal72      22  intimal thickening +----------+--------+--------+--------+---------------------+------------------+ ICA Prox  117     37      1-39%   irregular and                                                             calcific                                +----------+--------+--------+--------+---------------------+------------------+ ICA Mid   108     36                                                      +----------+--------+--------+--------+---------------------+------------------+ ICA Distal94      26                                                      +----------+--------+--------+--------+---------------------+------------------+ ECA       95      11                                                      +----------+--------+--------+--------+---------------------+------------------+ +----------+--------+-------+--------+-------------------+           PSV cm/sEDV cmsDescribeArm Pressure (mmHG) +----------+--------+-------+--------+-------------------+ Subclavian109                                         +----------+--------+-------+--------+-------------------+ +---------+--------+--+--------+--+ VertebralPSV cm/s51EDV cm/s15 +---------+--------+--+--------+--+  Left Carotid Findings: +----------+--------+--------+--------+------------------+------------------+           PSV cm/sEDV cm/sStenosisPlaque DescriptionComments           +----------+--------+--------+--------+------------------+------------------+ CCA Prox  113     23                                intimal thickening +----------+--------+--------+--------+------------------+------------------+ CCA Distal98      28                                intimal thickening +----------+--------+--------+--------+------------------+------------------+ ICA Prox  108     29      1-39%   heterogenous                         +----------+--------+--------+--------+------------------+------------------+ ICA Distal128     33                                                   +----------+--------+--------+--------+------------------+------------------+ ECA       91  12                                                   +----------+--------+--------+--------+------------------+------------------+ +----------+--------+--------+--------+-------------------+           PSV cm/sEDV cm/sDescribeArm Pressure (mmHG) +----------+--------+--------+--------+-------------------+ [id number]                                         +----------+--------+--------+--------+-------------------+ +---------+--------+--+--------+--+ VertebralPSV cm/s56EDV cm/s17 +---------+--------+--+--------+--+   Summary: Right Carotid: Velocities in the right ICA are consistent with a 1-39% stenosis. Left Carotid: Velocities in the left ICA are consistent with a 1-39% stenosis. Vertebrals:  Bilateral vertebral arteries demonstrate antegrade flow. Subclavians: Normal flow hemodynamics were seen in  bilateral subclavian              arteries. *See table(s) above for measurements and observations.  Electronically signed by Tracy Baker on 01/03/2024 at 7:52:43 PM.    Final    General appearance: alert, cooperative, and no distress Head: Normocephalic, without obvious abnormality, atraumatic Eyes: negative Nose: Nares normal. Septum midline. Mucosa normal. No drainage or sinus tenderness. Throat: Multiple carious teeth. No purulence, edema, fluctuance., Pharynx clear.  Neck: no adenopathy and supple, symmetrical, trachea midline  ASSESSMENT: Discussed extractions with patient. Patient agrees with treatment plan to remove 5 teeth.   PLAN: Extraction teeth #'s 2, 6, 7, 16, 29. GA   Ridley Schewe 01/04/2024

## 2024-01-04 NOTE — Care Management Note (Signed)
 When I went to start micafungin, I noticed IV was infiltrated; so, did not start.  Contacted pharmacy who had no recommendations.  IV team consulted and started new IV.  Micafungin was started in the new IV and RFA IV was removed.

## 2024-01-04 NOTE — Progress Notes (Signed)
 PROGRESS NOTE  Tracy Baker FMW:[id number] DOB: [birth year]   PCP: Kristina Tinnie POUR, PA-C  Patient is from: Home  DOA: 12/28/2023 LOS: 3  Chief complaints Chief Complaint  Patient presents with   Candidemia     Brief Narrative / Interim history: 68 year old F with PMH of nephrolithiasis s/p ureteral stent and lithotripsy at [facility] on 10/10 with stent removal on 10/14 presented to ED after she left AMA from [facility] in [place].  Patient was hospitalized there with Candida albicans candidemia.  CT showed stranding around the right kidney and ureter.  Urology was consulted and she was started on Rocephin.  However, urine and blood cultures grew Candida albicans.  ID was consulted and she was started on Diflucan.  However, subsequent cultures continue to grow Candida albicans despite appropriate antibiotics raising concern for endocarditis.  TTE without significant finding.  On arrival here, hemodynamically stable.  Hgb 17 with HCT of 50.  Mildly elevated LFT.  Blood cultures obtained.  ID consulted.  She was started on micafungin.  TEE on 10/28 with small mobile echodensity on A2 segment of anterior mitral valve raising concern for mitral valve vegetation.  Cardiothoracic surgery consulted, and recommended dental evaluation and cardiac catheterization before MVR next week   Subjective: Seen and examined earlier this morning.  No major events overnight or this morning.  No complaints.   Assessment and plan: C. 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  TTE reportedly negative.  Blood cultures NGTD.  No eye involvement per ophthalmology.  TEE raises concern for mitral valve vegetation/endocarditis.  Orthopantogram with multiple dental caries and multiple missing mandibular maxillary teeth -Plan for MVR after dental evaluation and heart catheterization per CVTS. -LHC on 10/31. -Dental extraction on 11/3. -MVR on 11/6. -Continue micafungin  per ID   Poor dentition/dental caries-orthopantogram as above. -Dental extraction on 11/3.  History of nephrolithiasis s/p ureteral stent and lithotripsy on 10/10 and stent removal on 10/14 at [facility]  Transaminitis: Likely due to the above.  He was also on Diflucan.  Resolved.   Hypocalcemia corrects to normal for hypoalbuminemia.  Normocytic anemia: Stable -Continue monitoring   Hypokalemia -Monitor replenish K and Mg as appropriate  Body mass index is 26.45 kg/m.           DVT prophylaxis:  enoxaparin (LOVENOX) injection 40 mg Start: 12/29/23 1600  Code Status: Full code Family Communication: None at bedside Level of care: Med-Surg Status is: Inpatient Remains inpatient appropriate because: Fungal endocarditis   Final disposition: Home   35 minutes with more than 50% spent in reviewing records, counseling patient/family and coordinating care.  Consultants:  Infectious disease Cardiothoracic surgery Ophthalmology Cardiology Dental surgery  Procedures: 10/28-TEE  Microbiology summarized: 10/24-blood cultures NGTD  Objective: Vitals:   01/03/24 2030 01/04/24 0500 01/04/24 0600 01/04/24 0822  BP: 131/70 (!) 152/65 (!) 152/65 129/62  Pulse: 67 72 72 64  Resp: 16 16 16 16   Temp: 98.2 F (36.8 C) 98.3 F (36.8 C)  97.9 F (36.6 C)  TempSrc: Oral Oral  Oral  SpO2: 96% 95% 94% 97%  Weight:      Height:        Examination:  GENERAL: No apparent distress.  Nontoxic. HEENT: MMM.  Vision and hearing grossly intact.  NECK: Supple.  No apparent JVD.  RESP:  No IWOB.  Fair aeration bilaterally. CVS:  RRR. Heart sounds like mechanical valve ABD/GI/GU: BS+. Abd soft, NTND.  MSK/EXT:  Moves extremities. No apparent deformity. No edema.  SKIN: no apparent skin lesion or wound NEURO: AA.  Oriented appropriately.  No apparent focal neuro deficit. PSYCH: Calm. Normal affect.   Sch Meds:  Scheduled Meds:  calcium carbonate  1 tablet Oral Q breakfast    enoxaparin (LOVENOX) injection  40 mg Subcutaneous Q24H   free water  500 mL Oral Once   pantoprazole   20 mg Oral Daily   potassium chloride  40 mEq Oral Daily   Continuous Infusions:  micafungin (MYCAMINE) 150 mg in sodium chloride  0.9 % 100 mL IVPB 150 mg (01/04/24 0952)   PRN Meds:.acetaminophen **OR** acetaminophen, clonazePAM , ondansetron   Antimicrobials: Anti-infectives (From admission, onward)    Start     Dose/Rate Route Frequency Ordered Stop   12/29/23 1100  micafungin (MYCAMINE) 150 mg in sodium chloride  0.9 % 100 mL IVPB        150 mg 107.5 mL/hr over 1 Hours Intravenous Every 24 hours 12/29/23 1005     12/29/23 1000  fluconazole (DIFLUCAN) IVPB 400 mg  Status:  Discontinued        400 mg 100 mL/hr over 120 Minutes Intravenous Every 24 hours 12/29/23 0424 12/29/23 1005        I have personally reviewed the following labs and images: CBC: Recent Labs  Lab 12/28/23 1831 12/28/23 1950 12/30/23 0326 12/31/23 0501 01/01/24 0446 01/03/24 0342 01/04/24 0354  WBC 8.6  --  6.8 6.1 7.4 5.5 5.8  NEUTROABS 5.9  --   --   --   --   --   --   HGB 11.9*   < > 10.4* 10.3* 10.7* 11.3* 11.2*  HCT 36.5   < > 31.2* 31.7* 31.4* 33.4* 33.4*  MCV 98.1  --  95.7 97.2 93.2 94.4 94.6  PLT 338  --  306 323 335 309 310   < > = values in this interval not displayed.   BMP &GFR Recent Labs  Lab 12/30/23 0326 12/31/23 0501 01/01/24 0446 01/03/24 0342 01/04/24 0354  NA 139 138 137 139 139  K 3.2* 3.4* 3.3* 4.0 3.9  CL 100 103 103 103 103  CO2 23 23 25 22 23   GLUCOSE 111* 100* 115* 109* 127*  BUN 10 9 10 13 18   CREATININE 0.58 0.64 0.64 0.71 0.63  CALCIUM 8.1* 8.3* 8.4* 8.7* 9.0  MG  --   --   --  1.8 1.8  PHOS  --   --   --  3.6 3.9   Estimated Creatinine Clearance: 64.6 mL/min (by C-G formula based on SCr of 0.63 mg/dL). Liver & Pancreas: Recent Labs  Lab 12/28/23 1831 12/30/23 0326 12/31/23 0501 01/01/24 0446 01/03/24 0342 01/04/24 0354  AST 47* 16 14* 12*  --    --   ALT 81* 44 35 29  --   --   ALKPHOS 366* 258* 305* 303*  --   --   BILITOT 1.1 1.0 0.8 0.8  --   --   PROT 7.2 6.4* 6.5 6.7  --   --   ALBUMIN 2.6* 2.1* 2.2* 2.2* 2.3* 2.4*   No results for input(s): LIPASE, AMYLASE in the last 168 hours. No results for input(s): AMMONIA in the last 168 hours. Diabetic: No results for input(s): HGBA1C in the last 72 hours. No results for input(s): GLUCAP in the last 168 hours. Cardiac Enzymes: No results for input(s): CKTOTAL, CKMB, CKMBINDEX, TROPONINI in the last 168 hours. No results for input(s): PROBNP in the last 8760 hours. Coagulation Profile: No results for  input(s): INR, PROTIME in the last 168 hours. Thyroid  Function Tests: No results for input(s): TSH, T4TOTAL, FREET4, T3FREE, THYROIDAB in the last 72 hours. Lipid Profile: No results for input(s): CHOL, HDL, LDLCALC, TRIG, CHOLHDL, LDLDIRECT in the last 72 hours. Anemia Panel: No results for input(s): VITAMINB12, FOLATE, FERRITIN, TIBC, IRON, RETICCTPCT in the last 72 hours. Urine analysis:    Component Value Date/Time   COLORURINE YELLOW 12/28/2023 1920   APPEARANCEUR HAZY (A) 12/28/2023 1920   APPEARANCEUR Clear 06/08/2022 1530   LABSPEC 1.012 12/28/2023 1920   PHURINE 5.0 12/28/2023 1920   GLUCOSEU NEGATIVE 12/28/2023 1920   HGBUR MODERATE (A) 12/28/2023 1920   BILIRUBINUR NEGATIVE 12/28/2023 1920   BILIRUBINUR Negative 11/08/2023 0848   BILIRUBINUR Negative 06/08/2022 1530   KETONESUR NEGATIVE 12/28/2023 1920   PROTEINUR NEGATIVE 12/28/2023 1920   UROBILINOGEN 0.2 11/08/2023 0848   NITRITE NEGATIVE 12/28/2023 1920   LEUKOCYTESUR TRACE (A) 12/28/2023 1920   Sepsis Labs: Invalid input(s): PROCALCITONIN, LACTICIDVEN  Microbiology: Recent Results (from the past 240 hours)  Blood culture (routine x 2)     Status: None   Collection Time: 12/28/23  6:57 PM   Specimen: BLOOD  Result Value Ref Range Status    Specimen Description BLOOD RIGHT ANTECUBITAL  Final   Special Requests   Final    BOTTLES DRAWN AEROBIC AND ANAEROBIC Blood Culture adequate volume   Culture   Final    NO GROWTH 5 DAYS Performed at [facility], [address], [place], [place] [address]    Report Status 01/02/2024 FINAL  Final  Blood culture (routine x 2)     Status: None   Collection Time: 12/28/23  7:06 PM   Specimen: BLOOD RIGHT HAND  Result Value Ref Range Status   Specimen Description BLOOD RIGHT HAND  Final   Special Requests   Final    BOTTLES DRAWN AEROBIC AND ANAEROBIC Blood Culture adequate volume   Culture   Final    NO GROWTH 5 DAYS Performed at [facility], [address], [place], [place] [address]    Report Status 01/02/2024 FINAL  Final  Urine Culture     Status: None   Collection Time: 12/29/23  8:37 AM   Specimen: Urine, Clean Catch  Result Value Ref Range Status   Specimen Description URINE, CLEAN CATCH  Final   Special Requests NONE  Final   Culture   Final    NO GROWTH Performed at [facility], [address] [place], [place] [address]    Report Status 12/30/2023 FINAL  Final    Radiology Studies: No results found.     Bhavya Eschete T. Intisar Claudio [facility]  If 7PM-7AM, please contact night-coverage [email or web address] 01/04/2024, 1:41 PM

## 2024-01-04 NOTE — Care Management Important Message (Signed)
 Important Message  Patient Details  Name: Natalia Wittmeyer MRN: [id number] Date of Birth: 1956/02/04   Important Message Given:  Yes - Medicare IM     Claretta Deed 01/04/2024, 4:31 PM

## 2024-01-05 ENCOUNTER — Encounter (HOSPITAL_COMMUNITY): Payer: Self-pay | Admitting: Cardiovascular Disease

## 2024-01-05 DIAGNOSIS — B377 Candidal sepsis: Secondary | ICD-10-CM | POA: Diagnosis not present

## 2024-01-05 DIAGNOSIS — I059 Rheumatic mitral valve disease, unspecified: Secondary | ICD-10-CM | POA: Diagnosis not present

## 2024-01-05 DIAGNOSIS — I3489 Other nonrheumatic mitral valve disorders: Secondary | ICD-10-CM | POA: Diagnosis not present

## 2024-01-05 DIAGNOSIS — I330 Acute and subacute infective endocarditis: Secondary | ICD-10-CM | POA: Diagnosis not present

## 2024-01-05 LAB — BASIC METABOLIC PANEL WITH GFR
Anion gap: 13 (ref 5–15)
BUN: 21 mg/dL (ref 8–23)
CO2: 23 mmol/L (ref 22–32)
Calcium: 9 mg/dL (ref 8.9–10.3)
Chloride: 101 mmol/L (ref 98–111)
Creatinine, Ser: 0.65 mg/dL (ref 0.44–1.00)
GFR, Estimated: >60 mL/min (ref >60)
Glucose, Bld: 99 mg/dL (ref 70–99)
Potassium: 4.2 mmol/L (ref 3.5–5.1)
Sodium: 137 mmol/L (ref 135–145)

## 2024-01-05 LAB — SURGICAL PCR SCREEN
MRSA, PCR: NEGATIVE
Staphylococcus aureus: NEGATIVE

## 2024-01-05 MED ORDER — ENOXAPARIN SODIUM 40 MG/0.4ML IJ SOSY: 40.0000 mg | PREFILLED_SYRINGE | INTRAMUSCULAR | Status: DC

## 2024-01-05 NOTE — Progress Notes (Signed)
  Progress Note  Patient Name: Tracy Baker Date of Encounter: 01/05/2024 [facility] Cardiologist: None   Interval Summary   Resting comfortably, several questions answered  Vital Signs Vitals:   01/04/24 1946 01/04/24 2328 01/05/24 0602 01/05/24 0747  BP: 127/72 121/62 138/69 120/81  Pulse: 66 66 60 66  Resp: 18 18 18 15   Temp: (!) 97.3 F (36.3 C) 98 F (36.7 C) 98.4 F (36.9 C) 98.1 F (36.7 C)  TempSrc: Oral Oral Oral Oral  SpO2: 94% 96% 94% 96%  Weight:      Height:        Intake/Output Summary (Last 24 hours) at 01/05/2024 1107 Last data filed at 01/04/2024 1609 Gross per 24 hour  Intake 500 ml  Output --  Net 500 ml      01/04/2024    3:02 PM 12/29/2023    4:00 AM 11/08/2023    8:37 AM  Last 3 Weights  Weight (lbs) 148 lb 5.9 oz 154 lb 1.6 oz 159 lb  Weight (kg) 67.3 kg 69.9 kg 72.122 kg      Telemetry/ECG  No adverse arrhythmias- Personally Reviewed  Physical Exam  GEN: No acute distress.   Neck: No JVD Cardiac: RRR, no murmurs, rubs, or gallops.  Respiratory: Clear to auscultation bilaterally. 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 -Right and left heart catheterization performed on 01/04/2024  No angiographic evidence of CAD Normal right and left heart pressures   Recommendations: Continue planning for mitral valve surgery.  Candida albicans candidemia/fungemia/fungal endocarditis - Prior serial blood cultures at [facility] with Candida.  No eye involvement per ophthalmology -MVR planned on 11/6. -Continue with micafungin per ID -Dental extraction 11/3  Recent lithotripsy with right ureteral stent placement on 12/14/2023 followed by stent removal 4 days later at [facility].  For questions or updates, please contact [facility] Please consult [email or web address] for contact info under         Signed, Oneil Parchment, MD

## 2024-01-05 NOTE — Progress Notes (Signed)
 PROGRESS NOTE  Tracy Baker FMW:[id number] DOB: [birth year]   PCP: Kristina Tinnie POUR, PA-C  Patient is from: Home  DOA: 12/28/2023 LOS: 4  Chief complaints Chief Complaint  Patient presents with   Candidemia     Brief Narrative / Interim history: 68 year old F with PMH of nephrolithiasis s/p ureteral stent and lithotripsy at [facility] on 10/10 with stent removal on 10/14 presented to ED after she left AMA from [facility] in [place].  Patient was hospitalized there with Candida albicans candidemia.  CT showed stranding around the right kidney and ureter.  Urology was consulted and she was started on Rocephin.  However, urine and blood cultures grew Candida albicans.  ID was consulted and she was started on Diflucan.  However, subsequent cultures continue to grow Candida albicans despite appropriate antibiotics raising concern for endocarditis.  TTE without significant finding.  On arrival here, hemodynamically stable.  Hgb 17 with HCT of 50.  Mildly elevated LFT.  Blood cultures obtained.  ID consulted.  She was started on micafungin.  TEE on 10/28 with small mobile echodensity on A2 segment of anterior mitral valve raising concern for mitral valve vegetation.  Cardiothoracic surgery consulted, and recommended dental evaluation and cardiac catheterization before MVR next week.  R/LHC on 10/31 negative.  Plan for dental extraction on 11/3 and MVR on 11/6.   Subjective: Seen and examined earlier this morning.  No major events overnight or this morning.  No complaints.   Assessment and plan: C. 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  TTE reportedly negative.  Blood cultures NGTD.  No eye involvement per ophthalmology.  TEE raises concern for mitral valve vegetation/endocarditis.  Orthopantogram with multiple dental caries and multiple missing mandibular maxillary teeth.  R/LHC on 10/31 negative. -Plan for dental extraction on 11/3 and MVR on  11/6. -Continue micafungin per ID -Cardiology and CVTS on board   Poor dentition/dental caries-orthopantogram as above. -Dental extraction on 11/3.  History of nephrolithiasis s/p ureteral stent and lithotripsy on 10/10 and stent removal on 10/14 at [facility]  Transaminitis: Likely due to the above.  He was also on Diflucan.  Resolved.   Hypocalcemia corrects to normal for hypoalbuminemia.  Normocytic anemia: Stable -Continue monitoring   Hypokalemia -Monitor replenish K and Mg as appropriate  Body mass index is 25.47 kg/m.           DVT prophylaxis:  enoxaparin (LOVENOX) injection 40 mg Start: 01/06/24 1000 patient refuses Lovenox despite risk and benefit discussion.  She is ambulatory.  Code Status: Full code Family Communication: None at bedside Level of care: Telemetry Cardiac Status is: Inpatient Remains inpatient appropriate because: Fungal endocarditis   Final disposition: Home   35 minutes with more than 50% spent in reviewing records, counseling patient/family and coordinating care.  Consultants:  Infectious disease Cardiothoracic surgery Ophthalmology Cardiology Dental surgery  Procedures: 10/28-TEE  Microbiology summarized: 10/24-blood cultures NGTD  Objective: Vitals:   01/04/24 1946 01/04/24 2328 01/05/24 0602 01/05/24 0747  BP: 127/72 121/62 138/69 120/81  Pulse: 66 66 60 66  Resp: 18 18 18 15   Temp: (!) 97.3 F (36.3 C) 98 F (36.7 C) 98.4 F (36.9 C) 98.1 F (36.7 C)  TempSrc: Oral Oral Oral Oral  SpO2: 94% 96% 94% 96%  Weight:      Height:        Examination:  GENERAL: No apparent distress.  Nontoxic. HEENT: MMM.  Vision and hearing grossly intact.  NECK: Supple.  No apparent JVD.  RESP:  No IWOB.  Fair aeration bilaterally. CVS:  RRR. Heart sounds like mechanical valve ABD/GI/GU: BS+. Abd soft, NTND.  MSK/EXT:  Moves extremities. No apparent deformity. No edema.  SKIN: no apparent skin lesion or wound NEURO: AA.  Oriented  appropriately.  No apparent focal neuro deficit. PSYCH: Calm. Normal affect.   Sch Meds:  Scheduled Meds:  calcium carbonate  1 tablet Oral Q breakfast   [START ON 01/06/2024] enoxaparin (LOVENOX) injection  40 mg Subcutaneous Q24H   pantoprazole   20 mg Oral Daily   potassium chloride  40 mEq Oral Daily   sodium chloride  flush  3 mL Intravenous Q12H   Continuous Infusions:  sodium chloride      micafungin (MYCAMINE) 150 mg in sodium chloride  0.9 % 100 mL IVPB 150 mg (01/05/24 0921)   PRN Meds:.sodium chloride , acetaminophen **OR** acetaminophen, clonazePAM , ondansetron , sodium chloride  flush  Antimicrobials: Anti-infectives (From admission, onward)    Start     Dose/Rate Route Frequency Ordered Stop   12/29/23 1100  micafungin (MYCAMINE) 150 mg in sodium chloride  0.9 % 100 mL IVPB        150 mg 107.5 mL/hr over 1 Hours Intravenous Every 24 hours 12/29/23 1005     12/29/23 1000  fluconazole (DIFLUCAN) IVPB 400 mg  Status:  Discontinued        400 mg 100 mL/hr over 120 Minutes Intravenous Every 24 hours 12/29/23 0424 12/29/23 1005        I have personally reviewed the following labs and images: CBC: Recent Labs  Lab 12/30/23 0326 12/31/23 0501 01/01/24 0446 01/03/24 0342 01/04/24 0354 01/04/24 1426 01/04/24 1431 01/04/24 1432  WBC 6.8 6.1 7.4 5.5 5.8  --   --   --   HGB 10.4* 10.3* 10.7* 11.3* 11.2* 10.2* 10.9* 10.9*  HCT 31.2* 31.7* 31.4* 33.4* 33.4* 30.0* 32.0* 32.0*  MCV 95.7 97.2 93.2 94.4 94.6  --   --   --   PLT 306 323 335 309 310  --   --   --    BMP &GFR Recent Labs  Lab 12/31/23 0501 01/01/24 0446 01/03/24 0342 01/04/24 0354 01/04/24 1426 01/04/24 1431 01/04/24 1432 01/05/24 0417  NA 138 137 139 139 143 141 140 137  K 3.4* 3.3* 4.0 3.9 4.0 4.2 4.3 4.2  CL 103 103 103 103  --   --   --  101  CO2 23 25 22 23   --   --   --  23  GLUCOSE 100* 115* 109* 127*  --   --   --  99  BUN 9 10 13 18   --   --   --  21  CREATININE 0.64 0.64 0.71 0.63  --   --    --  0.65  CALCIUM 8.3* 8.4* 8.7* 9.0  --   --   --  9.0  MG  --   --  1.8 1.8  --   --   --   --   PHOS  --   --  3.6 3.9  --   --   --   --    Estimated Creatinine Clearance: 63.4 mL/min (by C-G formula based on SCr of 0.65 mg/dL). Liver & Pancreas: Recent Labs  Lab 12/30/23 0326 12/31/23 0501 01/01/24 0446 01/03/24 0342 01/04/24 0354  AST 16 14* 12*  --   --   ALT 44 35 29  --   --   ALKPHOS 258* 305* 303*  --   --  BILITOT 1.0 0.8 0.8  --   --   PROT 6.4* 6.5 6.7  --   --   ALBUMIN 2.1* 2.2* 2.2* 2.3* 2.4*   No results for input(s): LIPASE, AMYLASE in the last 168 hours. No results for input(s): AMMONIA in the last 168 hours. Diabetic: No results for input(s): HGBA1C in the last 72 hours. No results for input(s): GLUCAP in the last 168 hours. Cardiac Enzymes: No results for input(s): CKTOTAL, CKMB, CKMBINDEX, TROPONINI in the last 168 hours. No results for input(s): PROBNP in the last 8760 hours. Coagulation Profile: No results for input(s): INR, PROTIME in the last 168 hours. Thyroid  Function Tests: No results for input(s): TSH, T4TOTAL, FREET4, T3FREE, THYROIDAB in the last 72 hours. Lipid Profile: No results for input(s): CHOL, HDL, LDLCALC, TRIG, CHOLHDL, LDLDIRECT in the last 72 hours. Anemia Panel: No results for input(s): VITAMINB12, FOLATE, FERRITIN, TIBC, IRON, RETICCTPCT in the last 72 hours. Urine analysis:    Component Value Date/Time   COLORURINE YELLOW 12/28/2023 1920   APPEARANCEUR HAZY (A) 12/28/2023 1920   APPEARANCEUR Clear 06/08/2022 1530   LABSPEC 1.012 12/28/2023 1920   PHURINE 5.0 12/28/2023 1920   GLUCOSEU NEGATIVE 12/28/2023 1920   HGBUR MODERATE (A) 12/28/2023 1920   BILIRUBINUR NEGATIVE 12/28/2023 1920   BILIRUBINUR Negative 11/08/2023 0848   BILIRUBINUR Negative 06/08/2022 1530   KETONESUR NEGATIVE 12/28/2023 1920   PROTEINUR NEGATIVE 12/28/2023 1920   UROBILINOGEN 0.2  11/08/2023 0848   NITRITE NEGATIVE 12/28/2023 1920   LEUKOCYTESUR TRACE (A) 12/28/2023 1920   Sepsis Labs: Invalid input(s): PROCALCITONIN, LACTICIDVEN  Microbiology: Recent Results (from the past 240 hours)  Blood culture (routine x 2)     Status: None   Collection Time: 12/28/23  6:57 PM   Specimen: BLOOD  Result Value Ref Range Status   Specimen Description BLOOD RIGHT ANTECUBITAL  Final   Special Requests   Final    BOTTLES DRAWN AEROBIC AND ANAEROBIC Blood Culture adequate volume   Culture   Final    NO GROWTH 5 DAYS Performed at [facility], [address], [place], [place] [address]    Report Status 01/02/2024 FINAL  Final  Blood culture (routine x 2)     Status: None   Collection Time: 12/28/23  7:06 PM   Specimen: BLOOD RIGHT HAND  Result Value Ref Range Status   Specimen Description BLOOD RIGHT HAND  Final   Special Requests   Final    BOTTLES DRAWN AEROBIC AND ANAEROBIC Blood Culture adequate volume   Culture   Final    NO GROWTH 5 DAYS Performed at [facility], [address] [place], [place] [address]    Report Status 01/02/2024 FINAL  Final  Urine Culture     Status: None   Collection Time: 12/29/23  8:37 AM   Specimen: Urine, Clean Catch  Result Value Ref Range Status   Specimen Description URINE, CLEAN CATCH  Final   Special Requests NONE  Final   Culture   Final    NO GROWTH Performed at [facility], [address] [place], [place] [address]    Report Status 12/30/2023 FINAL  Final    Radiology Studies: CARDIAC CATHETERIZATION Result Date: 01/04/2024 No angiographic evidence of CAD Normal right and left heart pressures Recommendations: Continue planning for mitral valve surgery.       Kailena Lubas T. Emiline Mancebo [facility]  If 7PM-7AM, please contact night-coverage [email or web address] 01/05/2024, 12:27 PM

## 2024-01-06 DIAGNOSIS — I059 Rheumatic mitral valve disease, unspecified: Secondary | ICD-10-CM | POA: Diagnosis not present

## 2024-01-06 DIAGNOSIS — B377 Candidal sepsis: Secondary | ICD-10-CM | POA: Diagnosis not present

## 2024-01-06 DIAGNOSIS — I330 Acute and subacute infective endocarditis: Secondary | ICD-10-CM | POA: Diagnosis not present

## 2024-01-06 NOTE — Progress Notes (Signed)
 No change in plans.  See prior progress note.

## 2024-01-06 NOTE — Progress Notes (Signed)
 PROGRESS NOTE  Tracy Baker FMW:[id number] DOB: February 04, 1956   PCP: Kristina Tinnie POUR, PA-C  Patient is from: Home  DOA: 12/28/2023 LOS: 5  Chief complaints Chief Complaint  Patient presents with   Candidemia     Brief Narrative / Interim history: 68 year old F with PMH of nephrolithiasis s/p ureteral stent and lithotripsy at [facility] on 10/10 with stent removal on 10/14 presented to ED after she left AMA from [facility] in [place].  Patient was hospitalized there with Candida albicans candidemia.  CT showed stranding around the right kidney and ureter.  Urology was consulted and she was started on Rocephin.  However, urine and blood cultures grew Candida albicans.  ID was consulted and she was started on Diflucan.  However, subsequent cultures continue to grow Candida albicans despite appropriate antibiotics raising concern for endocarditis.  TTE without significant finding.  On arrival here, hemodynamically stable.  Hgb 17 with HCT of 50.  Mildly elevated LFT.  Blood cultures obtained.  ID consulted.  She was started on micafungin.  TEE on 10/28 with small mobile echodensity on A2 segment of anterior mitral valve raising concern for mitral valve vegetation.  Cardiothoracic surgery consulted, and recommended dental evaluation and cardiac catheterization before MVR next week.  R/LHC on 10/31 negative.  Plan for dental extraction on 11/3 and MVR on 11/6.   Subjective: Seen and examined earlier this morning.  No major events overnight or this morning.  No complaints.   Assessment and plan: C. 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  TTE reportedly negative.  Blood cultures NGTD.  No eye involvement per ophthalmology.  TEE raises concern for mitral valve vegetation/endocarditis.  Orthopantogram with multiple dental caries and multiple missing mandibular maxillary teeth.  R/LHC on 10/31 negative. -Plan for dental extraction on 11/3 and MVR on  11/6. -Continue micafungin per ID -Cardiology and CVTS on board   Poor dentition/dental caries-orthopantogram as above. -Dental extraction on 11/3.  History of nephrolithiasis s/p ureteral stent and lithotripsy on 10/10 and stent removal on 10/14 at [facility]  Transaminitis: Likely due to the above.  He was also on Diflucan.  Resolved.   Hypocalcemia corrects to normal for hypoalbuminemia.  Normocytic anemia: Stable -Continue monitoring   Hypokalemia -Monitor replenish K and Mg as appropriate  Body mass index is 25.47 kg/m.           DVT prophylaxis:  enoxaparin (LOVENOX) injection 40 mg Start: 01/06/24 1000 patient refuses Lovenox despite risk and benefit discussion.  She is ambulatory.  Code Status: Full code Family Communication: None at bedside Level of care: Telemetry Cardiac Status is: Inpatient Remains inpatient appropriate because: Fungal endocarditis   Final disposition: Home   35 minutes with more than 50% spent in reviewing records, counseling patient/family and coordinating care.  Consultants:  Infectious disease Cardiothoracic surgery Ophthalmology Cardiology Dental surgery  Procedures: 10/28-TEE  Microbiology summarized: 10/24-blood cultures NGTD  Objective: Vitals:   01/05/24 2006 01/06/24 0007 01/06/24 0416 01/06/24 0821  BP: 130/77 119/66 129/69 123/67  Pulse: 75 (!) 58 60 63  Resp: 20 20 20 17   Temp: (!) 97.5 F (36.4 C) 98.3 F (36.8 C) 98.1 F (36.7 C) 97.9 F (36.6 C)  TempSrc: Oral Oral Oral Oral  SpO2: 96% 98% 100% 93%  Weight:      Height:        Examination:  GENERAL: No apparent distress.  Nontoxic. HEENT: MMM.  Vision and hearing grossly intact.  NECK: Supple.  No apparent JVD.  RESP:  No IWOB.  Fair aeration bilaterally. CVS:  RRR. Heart sounds like mechanical valve ABD/GI/GU: BS+. Abd soft, NTND.  MSK/EXT:  Moves extremities. No apparent deformity. No edema.  SKIN: no apparent skin lesion or wound NEURO: AA.   Oriented appropriately.  No apparent focal neuro deficit. PSYCH: Calm. Normal affect.   Sch Meds:  Scheduled Meds:  calcium carbonate  1 tablet Oral Q breakfast   enoxaparin (LOVENOX) injection  40 mg Subcutaneous Q24H   pantoprazole   20 mg Oral Daily   potassium chloride  40 mEq Oral Daily   sodium chloride  flush  3 mL Intravenous Q12H   Continuous Infusions:  micafungin (MYCAMINE) 150 mg in sodium chloride  0.9 % 100 mL IVPB 150 mg (01/06/24 0951)   PRN Meds:.acetaminophen **OR** acetaminophen, clonazePAM , ondansetron , sodium chloride  flush  Antimicrobials: Anti-infectives (From admission, onward)    Start     Dose/Rate Route Frequency Ordered Stop   12/29/23 1100  micafungin (MYCAMINE) 150 mg in sodium chloride  0.9 % 100 mL IVPB        150 mg 107.5 mL/hr over 1 Hours Intravenous Every 24 hours 12/29/23 1005     12/29/23 1000  fluconazole (DIFLUCAN) IVPB 400 mg  Status:  Discontinued        400 mg 100 mL/hr over 120 Minutes Intravenous Every 24 hours 12/29/23 0424 12/29/23 1005        I have personally reviewed the following labs and images: CBC: Recent Labs  Lab 12/31/23 0501 01/01/24 0446 01/03/24 0342 01/04/24 0354 01/04/24 1426 01/04/24 1431 01/04/24 1432  WBC 6.1 7.4 5.5 5.8  --   --   --   HGB 10.3* 10.7* 11.3* 11.2* 10.2* 10.9* 10.9*  HCT 31.7* 31.4* 33.4* 33.4* 30.0* 32.0* 32.0*  MCV 97.2 93.2 94.4 94.6  --   --   --   PLT 323 335 309 310  --   --   --    BMP &GFR Recent Labs  Lab 12/31/23 0501 01/01/24 0446 01/03/24 0342 01/04/24 0354 01/04/24 1426 01/04/24 1431 01/04/24 1432 01/05/24 0417  NA 138 137 139 139 143 141 140 137  K 3.4* 3.3* 4.0 3.9 4.0 4.2 4.3 4.2  CL 103 103 103 103  --   --   --  101  CO2 23 25 22 23   --   --   --  23  GLUCOSE 100* 115* 109* 127*  --   --   --  99  BUN 9 10 13 18   --   --   --  21  CREATININE 0.64 0.64 0.71 0.63  --   --   --  0.65  CALCIUM 8.3* 8.4* 8.7* 9.0  --   --   --  9.0  MG  --   --  1.8 1.8  --   --    --   --   PHOS  --   --  3.6 3.9  --   --   --   --    Estimated Creatinine Clearance: 63.4 mL/min (by C-G formula based on SCr of 0.65 mg/dL). Liver & Pancreas: Recent Labs  Lab 12/31/23 0501 01/01/24 0446 01/03/24 0342 01/04/24 0354  AST 14* 12*  --   --   ALT 35 29  --   --   ALKPHOS 305* 303*  --   --   BILITOT 0.8 0.8  --   --   PROT 6.5 6.7  --   --   ALBUMIN 2.2* 2.2*  2.3* 2.4*   No results for input(s): LIPASE, AMYLASE in the last 168 hours. No results for input(s): AMMONIA in the last 168 hours. Diabetic: No results for input(s): HGBA1C in the last 72 hours. No results for input(s): GLUCAP in the last 168 hours. Cardiac Enzymes: No results for input(s): CKTOTAL, CKMB, CKMBINDEX, TROPONINI in the last 168 hours. No results for input(s): PROBNP in the last 8760 hours. Coagulation Profile: No results for input(s): INR, PROTIME in the last 168 hours. Thyroid  Function Tests: No results for input(s): TSH, T4TOTAL, FREET4, T3FREE, THYROIDAB in the last 72 hours. Lipid Profile: No results for input(s): CHOL, HDL, LDLCALC, TRIG, CHOLHDL, LDLDIRECT in the last 72 hours. Anemia Panel: No results for input(s): VITAMINB12, FOLATE, FERRITIN, TIBC, IRON, RETICCTPCT in the last 72 hours. Urine analysis:    Component Value Date/Time   COLORURINE YELLOW 12/28/2023 1920   APPEARANCEUR HAZY (A) 12/28/2023 1920   APPEARANCEUR Clear 06/08/2022 1530   LABSPEC 1.012 12/28/2023 1920   PHURINE 5.0 12/28/2023 1920   GLUCOSEU NEGATIVE 12/28/2023 1920   HGBUR MODERATE (A) 12/28/2023 1920   BILIRUBINUR NEGATIVE 12/28/2023 1920   BILIRUBINUR Negative 11/08/2023 0848   BILIRUBINUR Negative 06/08/2022 1530   KETONESUR NEGATIVE 12/28/2023 1920   PROTEINUR NEGATIVE 12/28/2023 1920   UROBILINOGEN 0.2 11/08/2023 0848   NITRITE NEGATIVE 12/28/2023 1920   LEUKOCYTESUR TRACE (A) 12/28/2023 1920   Sepsis Labs: Invalid input(s):  PROCALCITONIN, LACTICIDVEN  Microbiology: Recent Results (from the past 240 hours)  Blood culture (routine x 2)     Status: None   Collection Time: 12/28/23  6:57 PM   Specimen: BLOOD  Result Value Ref Range Status   Specimen Description BLOOD RIGHT ANTECUBITAL  Final   Special Requests   Final    BOTTLES DRAWN AEROBIC AND ANAEROBIC Blood Culture adequate volume   Culture   Final    NO GROWTH 5 DAYS Performed at [facility], [address] [place], [place] [address]    Report Status 01/02/2024 FINAL  Final  Blood culture (routine x 2)     Status: None   Collection Time: 12/28/23  7:06 PM   Specimen: BLOOD RIGHT HAND  Result Value Ref Range Status   Specimen Description BLOOD RIGHT HAND  Final   Special Requests   Final    BOTTLES DRAWN AEROBIC AND ANAEROBIC Blood Culture adequate volume   Culture   Final    NO GROWTH 5 DAYS Performed at [facility], [address] [place], [place] [address]    Report Status 01/02/2024 FINAL  Final  Urine Culture     Status: None   Collection Time: 12/29/23  8:37 AM   Specimen: Urine, Clean Catch  Result Value Ref Range Status   Specimen Description URINE, CLEAN CATCH  Final   Special Requests NONE  Final   Culture   Final    NO GROWTH Performed at [facility], [address] [place], [place] [address]    Report Status 12/30/2023 FINAL  Final  Surgical PCR screen     Status: None   Collection Time: 01/05/24 12:22 PM   Specimen: Nasal Mucosa; Nasal Swab  Result Value Ref Range Status   MRSA, PCR NEGATIVE NEGATIVE Final   Staphylococcus aureus NEGATIVE NEGATIVE Final    Comment: (NOTE) The Xpert SA Assay (FDA approved for NASAL specimens in patients 41 years of age and older), is one component of a comprehensive surveillance program. It is not intended to diagnose  infection nor to guide or monitor treatment. Performed at [facility], [address] [place], [place] [address]     Radiology  Studies: No results found.      Jaloni Sorber T. Shawnna Pancake [facility]  If 7PM-7AM, please contact night-coverage [email or web address] 01/06/2024, 11:29 AM

## 2024-01-06 NOTE — Plan of Care (Signed)
  Problem: Education: Goal: Knowledge of General Education information will improve Description: Including pain rating scale, medication(s)/side effects and non-pharmacologic comfort measures Outcome: Progressing   Problem: Health Behavior/Discharge Planning: Goal: Ability to manage health-related needs will improve Outcome: Progressing   Problem: Clinical Measurements: Goal: Ability to maintain clinical measurements within normal limits will improve Outcome: Progressing Goal: Will remain free from infection Outcome: Progressing Goal: Diagnostic test results will improve Outcome: Progressing Goal: Respiratory complications will improve Outcome: Progressing Goal: Cardiovascular complication will be avoided Outcome: Progressing   Problem: Activity: Goal: Risk for activity intolerance will decrease Outcome: Progressing   Problem: Nutrition: Goal: Adequate nutrition will be maintained Outcome: Progressing   Problem: Elimination: Goal: Will not experience complications related to bowel motility Outcome: Progressing Goal: Will not experience complications related to urinary retention Outcome: Progressing   Problem: Pain Managment: Goal: General experience of comfort will improve and/or be controlled Outcome: Progressing   Problem: Skin Integrity: Goal: Risk for impaired skin integrity will decrease Outcome: Progressing   Problem: Education: Goal: Understanding of CV disease, CV risk reduction, and recovery process will improve Outcome: Progressing Goal: Individualized Educational Video(s) Outcome: Progressing   Problem: Activity: Goal: Ability to return to baseline activity level will improve Outcome: Progressing   Problem: Cardiovascular: Goal: Ability to achieve and maintain adequate cardiovascular perfusion will improve Outcome: Progressing Goal: Vascular access site(s) Level 0-1 will be maintained Outcome: Progressing   Problem: Health Behavior/Discharge  Planning: Goal: Ability to safely manage health-related needs after discharge will improve Outcome: Progressing

## 2024-01-07 ENCOUNTER — Inpatient Hospital Stay (HOSPITAL_COMMUNITY): Admitting: Anesthesiology

## 2024-01-07 ENCOUNTER — Inpatient Hospital Stay (HOSPITAL_COMMUNITY)
Admission: EM | Disposition: A | Discharge status: Rehabilitation Facility | Payer: Self-pay | Source: Home / Self Care | Attending: Thoracic Surgery (Cardiothoracic Vascular Surgery)

## 2024-01-07 ENCOUNTER — Encounter (HOSPITAL_COMMUNITY): Payer: Self-pay | Admitting: Family Medicine

## 2024-01-07 DIAGNOSIS — K0889 Other specified disorders of teeth and supporting structures: Secondary | ICD-10-CM | POA: Diagnosis not present

## 2024-01-07 DIAGNOSIS — F17210 Nicotine dependence, cigarettes, uncomplicated: Secondary | ICD-10-CM | POA: Diagnosis not present

## 2024-01-07 DIAGNOSIS — I10 Essential (primary) hypertension: Secondary | ICD-10-CM | POA: Diagnosis not present

## 2024-01-07 DIAGNOSIS — B377 Candidal sepsis: Secondary | ICD-10-CM | POA: Diagnosis not present

## 2024-01-07 DIAGNOSIS — I059 Rheumatic mitral valve disease, unspecified: Secondary | ICD-10-CM | POA: Diagnosis not present

## 2024-01-07 DIAGNOSIS — I330 Acute and subacute infective endocarditis: Secondary | ICD-10-CM | POA: Diagnosis not present

## 2024-01-07 HISTORY — PX: TOOTH EXTRACTION: SHX859

## 2024-01-07 SURGERY — DENTAL RESTORATION/EXTRACTIONS
Anesthesia: General | Site: Mouth

## 2024-01-07 MED ORDER — OXYCODONE-ACETAMINOPHEN 5-325 MG PO TABS
1.0000 | ORAL_TABLET | ORAL | Status: DC | PRN
Start: 2024-01-07 — End: 2024-01-10
  Filled 2024-01-07: qty 2

## 2024-01-07 MED ORDER — PROPOFOL 10 MG/ML IV BOLUS
INTRAVENOUS | Status: DC | PRN
  Administered 2024-01-07: 150 mg via INTRAVENOUS

## 2024-01-07 MED ORDER — LIDOCAINE 2% (20 MG/ML) 5 ML SYRINGE
INTRAMUSCULAR | Status: AC
  Filled 2024-01-07: qty 5

## 2024-01-07 MED ORDER — LACTATED RINGERS IV SOLN: INTRAVENOUS | Status: DC

## 2024-01-07 MED ORDER — SODIUM CHLORIDE 0.9 % IR SOLN
Status: DC | PRN
  Administered 2024-01-07: 500 mL

## 2024-01-07 MED ORDER — FENTANYL CITRATE (PF) 250 MCG/5ML IJ SOLN
INTRAMUSCULAR | Status: DC | PRN
Start: 2024-01-07 — End: 2024-01-07
  Administered 2024-01-07: 50 ug via INTRAVENOUS

## 2024-01-07 MED ORDER — PHENYLEPHRINE 80 MCG/ML (10ML) SYRINGE FOR IV PUSH (FOR BLOOD PRESSURE SUPPORT)
PREFILLED_SYRINGE | INTRAVENOUS | Status: AC
Start: 2024-01-07 — End: 2024-01-07
  Filled 2024-01-07: qty 10

## 2024-01-07 MED ORDER — LIDOCAINE 2% (20 MG/ML) 5 ML SYRINGE
INTRAMUSCULAR | Status: DC | PRN
  Administered 2024-01-07: 60 mg via INTRAVENOUS

## 2024-01-07 MED ORDER — DEXAMETHASONE SOD PHOSPHATE PF 10 MG/ML IJ SOLN
INTRAMUSCULAR | Status: DC | PRN
  Administered 2024-01-07: 10 mg via INTRAVENOUS

## 2024-01-07 MED ORDER — ACETAMINOPHEN 10 MG/ML IV SOLN
INTRAVENOUS | Status: AC
  Filled 2024-01-07: qty 100

## 2024-01-07 MED ORDER — SUGAMMADEX SODIUM 200 MG/2ML IV SOLN
INTRAVENOUS | Status: DC | PRN
  Administered 2024-01-07: 200 mg via INTRAVENOUS

## 2024-01-07 MED ORDER — MIDAZOLAM HCL (PF) 2 MG/2ML IJ SOLN
INTRAMUSCULAR | Status: DC | PRN
  Administered 2024-01-07 (×2): 1 mg via INTRAVENOUS

## 2024-01-07 MED ORDER — OXYMETAZOLINE HCL 0.05 % NA SOLN
NASAL | Status: AC
  Filled 2024-01-07: qty 30

## 2024-01-07 MED ORDER — CEFAZOLIN SODIUM-DEXTROSE 2-4 GM/100ML-% IV SOLN
INTRAVENOUS | Status: AC
  Filled 2024-01-07: qty 100

## 2024-01-07 MED ORDER — ROCURONIUM BROMIDE 10 MG/ML (PF) SYRINGE
PREFILLED_SYRINGE | INTRAVENOUS | Status: AC
  Filled 2024-01-07: qty 10

## 2024-01-07 MED ORDER — CEFAZOLIN SODIUM-DEXTROSE 2-4 GM/100ML-% IV SOLN
2.0000 g | INTRAVENOUS | Status: AC
  Administered 2024-01-07: 2 g via INTRAVENOUS
  Filled 2024-01-07: qty 100

## 2024-01-07 MED ORDER — CHLORHEXIDINE GLUCONATE 0.12 % MT SOLN
15.0000 mL | Freq: Once | OROMUCOSAL | Status: AC
  Filled 2024-01-07: qty 15

## 2024-01-07 MED ORDER — HYDROMORPHONE HCL 1 MG/ML IJ SOLN: 0.5000 mg | INTRAMUSCULAR | Status: DC | PRN

## 2024-01-07 MED ORDER — PROPOFOL 10 MG/ML IV BOLUS
INTRAVENOUS | Status: AC
Start: 2024-01-07 — End: 2024-01-07
  Filled 2024-01-07: qty 20

## 2024-01-07 MED ORDER — ONDANSETRON HCL 4 MG/2ML IJ SOLN: 4.0000 mg | Freq: Once | INTRAMUSCULAR | Status: DC | PRN

## 2024-01-07 MED ORDER — ONDANSETRON HCL 4 MG/2ML IJ SOLN
INTRAMUSCULAR | Status: DC | PRN
Start: 2024-01-07 — End: 2024-01-07
  Administered 2024-01-07: 4 mg via INTRAVENOUS

## 2024-01-07 MED ORDER — HYDROMORPHONE HCL 1 MG/ML IJ SOLN
0.5000 mg | INTRAMUSCULAR | Status: DC | PRN
Start: 2024-01-07 — End: 2024-01-10

## 2024-01-07 MED ORDER — ROCURONIUM BROMIDE 10 MG/ML (PF) SYRINGE
PREFILLED_SYRINGE | INTRAVENOUS | Status: DC | PRN
  Administered 2024-01-07: 50 mg via INTRAVENOUS

## 2024-01-07 MED ORDER — FENTANYL CITRATE (PF) 100 MCG/2ML IJ SOLN
INTRAMUSCULAR | Status: AC
  Filled 2024-01-07: qty 2

## 2024-01-07 MED ORDER — ONDANSETRON HCL 4 MG/2ML IJ SOLN
INTRAMUSCULAR | Status: AC
  Filled 2024-01-07: qty 2

## 2024-01-07 MED ORDER — 0.9 % SODIUM CHLORIDE (POUR BTL) OPTIME
TOPICAL | Status: DC | PRN
Start: 2024-01-07 — End: 2024-01-07
  Administered 2024-01-07: 1000 mL

## 2024-01-07 MED ORDER — ORAL CARE MOUTH RINSE
15.0000 mL | Freq: Once | OROMUCOSAL | Status: AC
  Administered 2024-01-07: 15 mL via OROMUCOSAL

## 2024-01-07 MED ORDER — ACETAMINOPHEN 500 MG PO TABS
1000.0000 mg | ORAL_TABLET | Freq: Once | ORAL | Status: AC
  Administered 2024-01-07: 1000 mg via ORAL
  Filled 2024-01-07: qty 2

## 2024-01-07 MED ORDER — HEMOSTATIC AGENTS (NO CHARGE) OPTIME
TOPICAL | Status: DC | PRN
  Administered 2024-01-07: 1 via TOPICAL

## 2024-01-07 MED ORDER — LIDOCAINE-EPINEPHRINE 2 %-1:100000 IJ SOLN
INTRAMUSCULAR | Status: DC | PRN
Start: 2024-01-07 — End: 2024-01-07
  Administered 2024-01-07: 13 mL

## 2024-01-07 MED ORDER — KETOROLAC TROMETHAMINE 15 MG/ML IJ SOLN: 15.0000 mg | Freq: Four times a day (QID) | INTRAMUSCULAR | Status: DC | PRN

## 2024-01-07 MED ORDER — AMISULPRIDE (ANTIEMETIC) 5 MG/2ML IV SOLN: 10.0000 mg | Freq: Once | INTRAVENOUS | Status: DC | PRN

## 2024-01-07 MED ORDER — PROPOFOL 10 MG/ML IV BOLUS
INTRAVENOUS | Status: AC
  Filled 2024-01-07: qty 20

## 2024-01-07 MED ORDER — MIDAZOLAM HCL 2 MG/2ML IJ SOLN
INTRAMUSCULAR | Status: AC
  Filled 2024-01-07: qty 2

## 2024-01-07 MED ORDER — LIDOCAINE-EPINEPHRINE 2 %-1:100000 IJ SOLN
INTRAMUSCULAR | Status: AC
  Filled 2024-01-07: qty 1

## 2024-01-07 MED ORDER — PHENYLEPHRINE 80 MCG/ML (10ML) SYRINGE FOR IV PUSH (FOR BLOOD PRESSURE SUPPORT)
PREFILLED_SYRINGE | INTRAVENOUS | Status: DC | PRN
  Administered 2024-01-07 (×3): 80 ug via INTRAVENOUS

## 2024-01-07 MED ORDER — HYDROMORPHONE HCL 1 MG/ML IJ SOLN: 0.2500 mg | INTRAMUSCULAR | Status: DC | PRN

## 2024-01-07 SURGICAL SUPPLY — 29 items
BAG COUNTER SPONGE SURGICOUNT (BAG) IMPLANT
BLADE SURG 15 STRL LF DISP TIS (BLADE) ×1 IMPLANT
BUR CROSS CUT FISSURE 1.6 (BURR) ×1 IMPLANT
BUR EGG ELITE 4.0 (BURR) IMPLANT
CANISTER SUCTION 3000ML PPV (SUCTIONS) ×1 IMPLANT
COVER SURGICAL LIGHT HANDLE (MISCELLANEOUS) ×1 IMPLANT
GAUZE PACKING FOLDED 2 STR (GAUZE/BANDAGES/DRESSINGS) ×1 IMPLANT
GLOVE BIO SURGEON STRL SZ8 (GLOVE) ×1 IMPLANT
GOWN STRL REUS W/ TWL LRG LVL3 (GOWN DISPOSABLE) ×1 IMPLANT
GOWN STRL REUS W/ TWL XL LVL3 (GOWN DISPOSABLE) ×1 IMPLANT
IV 0.9% NACL 1000 ML (IV SOLUTION) ×1 IMPLANT
KIT BASIN OR (CUSTOM PROCEDURE TRAY) ×1 IMPLANT
KIT TURNOVER KIT B (KITS) ×1 IMPLANT
NDL HYPO 22X1.5 SAFETY MO (MISCELLANEOUS) IMPLANT
NDL HYPO 25GX1X1/2 BEV (NEEDLE) ×2 IMPLANT
NEEDLE HYPO 22X1.5 SAFETY MO (MISCELLANEOUS) IMPLANT
NEEDLE HYPO 25GX1X1/2 BEV (NEEDLE) ×2 IMPLANT
PAD ARMBOARD POSITIONER FOAM (MISCELLANEOUS) ×1 IMPLANT
SLEEVE IRRIGATION ELITE 7 (MISCELLANEOUS) ×1 IMPLANT
SOLN 0.9% NACL POUR BTL 1000ML (IV SOLUTION) ×1 IMPLANT
SPIKE FLUID TRANSFER (MISCELLANEOUS) IMPLANT
SPONGE SURGIFOAM ABS GEL SZ50 (HEMOSTASIS) IMPLANT
SUT CHROMIC 3 0 SH 27 (SUTURE) IMPLANT
SUT CHROMIC 4 0 RB 1X27 (SUTURE) ×1 IMPLANT
SYR BULB IRRIG 60ML STRL (SYRINGE) ×1 IMPLANT
SYR CONTROL 10ML LL (SYRINGE) ×1 IMPLANT
TRAY ENT MC OR (CUSTOM PROCEDURE TRAY) ×1 IMPLANT
TUBING IRRIGATION (MISCELLANEOUS) ×1 IMPLANT
YANKAUER SUCT BULB TIP NO VENT (SUCTIONS) ×1 IMPLANT

## 2024-01-07 NOTE — Anesthesia Procedure Notes (Signed)
 Procedure Name: Intubation Date/Time: 01/07/2024 7:46 AM  Performed by: Jolynn Mage, CRNAPre-anesthesia Checklist: Patient identified, Patient being monitored, Timeout performed, Emergency Drugs available and Suction available Patient Re-evaluated:Patient Re-evaluated prior to induction Oxygen Delivery Method: Circle System Utilized Preoxygenation: Pre-oxygenation with 100% oxygen Induction Type: IV induction Ventilation: Mask ventilation without difficulty Laryngoscope Size: Glidescope, Mac and 3 Grade View: Grade I Tube type: Oral Tube size: 7.0 mm Number of attempts: 1 Airway Equipment and Method: Video-laryngoscopy and Rigid stylet Placement Confirmation: ETT inserted through vocal cords under direct vision, positive ETCO2 and breath sounds checked- equal and bilateral Secured at: 21 cm Tube secured with: Tape Dental Injury: Teeth and Oropharynx as per pre-operative assessment  Difficulty Due To: Difficult Airway- due to dentition and Difficult Airway- due to limited oral opening

## 2024-01-07 NOTE — Progress Notes (Signed)
 PROGRESS NOTE  Tracy Baker FMW:[id number] DOB: October 15, 1955   PCP: Kristina Tinnie POUR, PA-C  Patient is from: Home  DOA: 12/28/2023 LOS: 5  Chief complaints Chief Complaint  Patient presents with   Candidemia     Brief Narrative / Interim history: 68 year old F with PMH of nephrolithiasis s/p ureteral stent and lithotripsy at [facility] on 10/10 with stent removal on 10/14 presented to ED after she left AMA from [facility] in [place].  Patient was hospitalized there with Candida albicans candidemia.  CT showed stranding around the right kidney and ureter.  Urology was consulted and she was started on Rocephin.  However, urine and blood cultures grew Candida albicans.  ID was consulted and she was started on Diflucan.  However, subsequent cultures continue to grow Candida albicans despite appropriate antibiotics raising concern for endocarditis.  TTE without significant finding.  On arrival here, hemodynamically stable.  Hgb 17 with HCT of 50.  Mildly elevated LFT.  Blood cultures obtained.  ID consulted.  She was started on micafungin.  TEE on 10/28 with small mobile echodensity on A2 segment of anterior mitral valve raising concern for mitral valve vegetation.  Cardiothoracic surgery consulted, and recommended dental evaluation and cardiac catheterization before MVR next week.  R/LHC on 10/31 negative.  Underwent dental extraction of teeth numbers 2, 6, 7, 16 and 29 by Dr. Sheryle on 11/3.  Plan for MVR on 11/6.   Subjective: Seen and examined earlier this morning she returned from dental extraction.No major complaints. Some pain after surgery   Assessment and plan: C. 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  TTE reportedly negative.  Blood cultures NGTD.  No eye involvement per ophthalmology.  TEE raises concern for mitral valve vegetation/endocarditis.  Orthopantogram with multiple dental caries and multiple missing mandibular maxillary  teeth.   -R/LHC on 10/31 negative. -Dental extraction of teeth numbers 2, 6, 7, 16 and 29 by Dr. Sheryle on 11/3 -Plan for MVR on 11/06 -Tylenol, Toradol and Dilaudid for pain control -Continue micafungin per ID -ID, Cardiology and CTS on board   Poor dentition/dental caries-orthopantogram as above. -S/p dental extraction on 11/3.  History of nephrolithiasis s/p ureteral stent and lithotripsy on 10/10 and stent removal on 10/14 at [facility]  Transaminitis: Likely due to the above.  He was also on Diflucan.  Resolved.   Hypocalcemia corrects to normal for hypoalbuminemia.  Normocytic anemia: Stable -Continue monitoring   Hypokalemia -Monitor replenish K and Mg as appropriate  Body mass index is 25.47 kg/m.           DVT prophylaxis:   patient refuses Lovenox despite risk and benefit discussion.  She is ambulatory.  Code Status: Full code Family Communication: None at bedside Level of care: Telemetry Cardiac Status is: Inpatient Remains inpatient appropriate because: Fungal endocarditis   Final disposition: Home   35 minutes with more than 50% spent in reviewing records, counseling patient/family and coordinating care.  Consultants:  Infectious disease Cardiothoracic surgery Ophthalmology Cardiology Dental surgery  Procedures: 10/28-TEE 10/31-R/LHC on negative. 11/03-Dental extraction of teeth numbers 2, 6, 7, 16 and 29 by Dr. Sheryle   Microbiology summarized: 10/24-blood cultures NGTD  Objective: Vitals:   01/07/24 0925 01/07/24 1040 01/07/24 1055 01/07/24 1110  BP: 122/67 126/69 126/73 122/67  Pulse: 70 70 69 71  Resp:      Temp: 98.2 F (36.8 C)     TempSrc: Axillary     SpO2: 96% 94% 94% 93%  Weight:  Height:        Examination:  GENERAL: No apparent distress.  Nontoxic. HEENT: MMM.  Vision and hearing grossly intact.  NECK: Supple.  No apparent JVD.  RESP:  No IWOB.  Fair aeration bilaterally. CVS:  RRR. Heart sounds like mechanical  valve ABD/GI/GU: BS+. Abd soft, NTND.  MSK/EXT:  Moves extremities. No apparent deformity. No edema.  SKIN: no apparent skin lesion or wound NEURO: AA.  Oriented appropriately.  No apparent focal neuro deficit. PSYCH: Calm. Normal affect.   Sch Meds:  Scheduled Meds:  calcium carbonate  1 tablet Oral Q breakfast   pantoprazole   20 mg Oral Daily   sodium chloride  flush  3 mL Intravenous Q12H   Continuous Infusions:  acetaminophen     micafungin (MYCAMINE) 150 mg in sodium chloride  0.9 % 100 mL IVPB 150 mg (01/07/24 1106)   PRN Meds:.acetaminophen, acetaminophen **OR** acetaminophen, clonazePAM , HYDROmorphone (DILAUDID) injection, ondansetron , oxyCODONE-acetaminophen, sodium chloride  flush  Antimicrobials: Anti-infectives (From admission, onward)    Start     Dose/Rate Route Frequency Ordered Stop   01/07/24 0715  ceFAZolin (ANCEF) IVPB 2g/100 mL premix        2 g 200 mL/hr over 30 Minutes Intravenous On call to O.R. 01/07/24 0701 01/07/24 0816   01/07/24 0713  ceFAZolin (ANCEF) 2-4 GM/100ML-% IVPB  Status:  Discontinued       Note to Pharmacy: Effie Rily O: cabinet override      01/07/24 0713 01/07/24 0717   12/29/23 1100  micafungin (MYCAMINE) 150 mg in sodium chloride  0.9 % 100 mL IVPB        150 mg 107.5 mL/hr over 1 Hours Intravenous Every 24 hours 12/29/23 1005     12/29/23 1000  fluconazole (DIFLUCAN) IVPB 400 mg  Status:  Discontinued        400 mg 100 mL/hr over 120 Minutes Intravenous Every 24 hours 12/29/23 0424 12/29/23 1005        I have personally reviewed the following labs and images: CBC: Recent Labs  Lab 01/01/24 0446 01/03/24 0342 01/04/24 0354 01/04/24 1426 01/04/24 1431 01/04/24 1432  WBC 7.4 5.5 5.8  --   --   --   HGB 10.7* 11.3* 11.2* 10.2* 10.9* 10.9*  HCT 31.4* 33.4* 33.4* 30.0* 32.0* 32.0*  MCV 93.2 94.4 94.6  --   --   --   PLT 335 309 310  --   --   --    BMP &GFR Recent Labs  Lab 01/01/24 0446 01/03/24 0342 01/04/24 0354  01/04/24 1426 01/04/24 1431 01/04/24 1432 01/05/24 0417  NA 137 139 139 143 141 140 137  K 3.3* 4.0 3.9 4.0 4.2 4.3 4.2  CL 103 103 103  --   --   --  101  CO2 25 22 23   --   --   --  23  GLUCOSE 115* 109* 127*  --   --   --  99  BUN 10 13 18   --   --   --  21  CREATININE 0.64 0.71 0.63  --   --   --  0.65  CALCIUM 8.4* 8.7* 9.0  --   --   --  9.0  MG  --  1.8 1.8  --   --   --   --   PHOS  --  3.6 3.9  --   --   --   --    Estimated Creatinine Clearance: 63.4 mL/min (by C-G formula based on  SCr of 0.65 mg/dL). Liver & Pancreas: Recent Labs  Lab 01/01/24 0446 01/03/24 0342 01/04/24 0354  AST 12*  --   --   ALT 29  --   --   ALKPHOS 303*  --   --   BILITOT 0.8  --   --   PROT 6.7  --   --   ALBUMIN 2.2* 2.3* 2.4*   No results for input(s): LIPASE, AMYLASE in the last 168 hours. No results for input(s): AMMONIA in the last 168 hours. Diabetic: No results for input(s): HGBA1C in the last 72 hours. No results for input(s): GLUCAP in the last 168 hours. Cardiac Enzymes: No results for input(s): CKTOTAL, CKMB, CKMBINDEX, TROPONINI in the last 168 hours. No results for input(s): PROBNP in the last 8760 hours. Coagulation Profile: No results for input(s): INR, PROTIME in the last 168 hours. Thyroid  Function Tests: No results for input(s): TSH, T4TOTAL, FREET4, T3FREE, THYROIDAB in the last 72 hours. Lipid Profile: No results for input(s): CHOL, HDL, LDLCALC, TRIG, CHOLHDL, LDLDIRECT in the last 72 hours. Anemia Panel: No results for input(s): VITAMINB12, FOLATE, FERRITIN, TIBC, IRON, RETICCTPCT in the last 72 hours. Urine analysis:    Component Value Date/Time   COLORURINE YELLOW 12/28/2023 1920   APPEARANCEUR HAZY (A) 12/28/2023 1920   APPEARANCEUR Clear 06/08/2022 1530   LABSPEC 1.012 12/28/2023 1920   PHURINE 5.0 12/28/2023 1920   GLUCOSEU NEGATIVE 12/28/2023 1920   HGBUR MODERATE (A) 12/28/2023 1920    BILIRUBINUR NEGATIVE 12/28/2023 1920   BILIRUBINUR Negative 11/08/2023 0848   BILIRUBINUR Negative 06/08/2022 1530   KETONESUR NEGATIVE 12/28/2023 1920   PROTEINUR NEGATIVE 12/28/2023 1920   UROBILINOGEN 0.2 11/08/2023 0848   NITRITE NEGATIVE 12/28/2023 1920   LEUKOCYTESUR TRACE (A) 12/28/2023 1920   Sepsis Labs: Invalid input(s): PROCALCITONIN, LACTICIDVEN  Microbiology: Recent Results (from the past 240 hours)  Blood culture (routine x 2)     Status: None   Collection Time: 12/28/23  6:57 PM   Specimen: BLOOD  Result Value Ref Range Status   Specimen Description BLOOD RIGHT ANTECUBITAL  Final   Special Requests   Final    BOTTLES DRAWN AEROBIC AND ANAEROBIC Blood Culture adequate volume   Culture   Final    NO GROWTH 5 DAYS Performed at [facility], [address], [place], [place] [address]    Report Status 01/02/2024 FINAL  Final  Blood culture (routine x 2)     Status: None   Collection Time: 12/28/23  7:06 PM   Specimen: BLOOD RIGHT HAND  Result Value Ref Range Status   Specimen Description BLOOD RIGHT HAND  Final   Special Requests   Final    BOTTLES DRAWN AEROBIC AND ANAEROBIC Blood Culture adequate volume   Culture   Final    NO GROWTH 5 DAYS Performed at [facility], [address] [place], [place] [address]    Report Status 01/02/2024 FINAL  Final  Urine Culture     Status: None   Collection Time: 12/29/23  8:37 AM   Specimen: Urine, Clean Catch  Result Value Ref Range Status   Specimen Description URINE, CLEAN CATCH  Final   Special Requests NONE  Final   Culture   Final    NO GROWTH Performed at [facility], [address], [place], [place] [address]    Report Status 12/30/2023 FINAL  Final  Surgical PCR screen     Status: None   Collection Time: 01/05/24  12:22 PM   Specimen: Nasal Mucosa; Nasal Swab  Result Value Ref Range Status   MRSA, PCR NEGATIVE NEGATIVE Final   Staphylococcus aureus NEGATIVE NEGATIVE Final     Comment: (NOTE) The Xpert SA Assay (FDA approved for NASAL specimens in patients 53 years of age and older), is one component of a comprehensive surveillance program. It is not intended to diagnose infection nor to guide or monitor treatment. Performed at [facility], [address] [place], [place] [address]     Radiology Studies: No results found.      Larita Deremer T. Suhani Stillion [facility]  If 7PM-7AM, please contact night-coverage [email or web address] 01/07/2024, 11:46 AM

## 2024-01-07 NOTE — Transfer of Care (Signed)
 Immediate Anesthesia Transfer of Care Note  Patient: Tracy Baker  Procedure(s) Performed: DENTAL RESTORATION/EXTRACTIONS (Mouth)  Patient Location: PACU  Anesthesia Type:General  Level of Consciousness: drowsy and responds to stimulation  Airway & Oxygen Therapy: Patient Spontanous Breathing  Post-op Assessment: Report given to RN and Post -op Vital signs reviewed and stable  Post vital signs: Reviewed and stable  Last Vitals:  Vitals Value Taken Time  BP 143/58 01/07/24 08:30  Temp    Pulse 84 01/07/24 08:31  Resp 20 01/07/24 08:31  SpO2 92 % 01/07/24 08:31  Vitals shown include unfiled device data.  Last Pain:  Vitals:   01/07/24 0727  TempSrc: Oral  PainSc:       Patients Stated Pain Goal: 0 (01/04/24 1500)  Complications: No notable events documented.

## 2024-01-07 NOTE — H&P (Signed)
 H&P documentation  -History and Physical Reviewed  -Patient has been re-examined  -No change in the plan of care  Tracy Baker

## 2024-01-07 NOTE — Progress Notes (Signed)
  Progress Note  Patient Name: Tracy Baker Date of Encounter: 01/07/2024 [facility] Cardiologist: None   Interval Summary   The patient is just back to her room after dental extractions in the OR.  No chest pain or shortness of breath.  Vital Signs Vitals:   01/07/24 0830 01/07/24 0845 01/07/24 0900 01/07/24 0925  BP: (!) 143/58 134/63 124/64 122/67  Pulse: 86 78 87 70  Resp: 20 17 16    Temp: 98.2 F (36.8 C)  98.2 F (36.8 C) 98.2 F (36.8 C)  TempSrc:    Axillary  SpO2: 92% 93% 95% 96%  Weight:      Height:        Intake/Output Summary (Last 24 hours) at 01/07/2024 0935 Last data filed at 01/07/2024 0816 Gross per 24 hour  Intake 500 ml  Output 5 ml  Net 495 ml      01/04/2024    3:02 PM 12/29/2023    4:00 AM 11/08/2023    8:37 AM  Last 3 Weights  Weight (lbs) 148 lb 5.9 oz 154 lb 1.6 oz 159 lb  Weight (kg) 67.3 kg 69.9 kg 72.122 kg      Telemetry/ECG  Sinus rhythm with no arrhythmia- Personally Reviewed  Physical Exam  GEN: No acute distress.   Neck: No JVD Cardiac: RRR, no murmurs, rubs, or gallops.  Respiratory: Clear to auscultation bilaterally. 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  Small vegetation demonstrated on the anterior leaflet of the mitral valve with no significant mitral regurgitation.  Cardiac catheterization study reviewed with widely patent coronary arteries and no CAD.  Patient now status post dental extraction this morning.  Tentative plans for surgical mitral valve repair or replacement on 11/6.  Heart rhythm stable.  No other acute cardiac issues noted.    For questions or updates, please contact [facility] Please consult [email or web address] for contact info under         Signed, Ozell Fell, MD

## 2024-01-07 NOTE — Addendum Note (Signed)
 Addendum  created 01/07/24 0938 by Jolynn Mage, CRNA   Intraprocedure Event edited

## 2024-01-07 NOTE — Plan of Care (Signed)
  Problem: Pain Managment: Goal: General experience of comfort will improve and/or be controlled Outcome: Progressing

## 2024-01-07 NOTE — Anesthesia Postprocedure Evaluation (Signed)
 Anesthesia Post Note  Patient: Tracy Baker  Procedure(s) Performed: DENTAL RESTORATION/EXTRACTIONS (Mouth)     Patient location during evaluation: PACU Anesthesia Type: General Level of consciousness: awake and alert, oriented and patient cooperative Pain management: pain level controlled Vital Signs Assessment: post-procedure vital signs reviewed and stable Respiratory status: spontaneous breathing, nonlabored ventilation and respiratory function stable Cardiovascular status: blood pressure returned to baseline and stable Postop Assessment: no apparent nausea or vomiting Anesthetic complications: no   No notable events documented.  Last Vitals:  Vitals:   01/07/24 0727 01/07/24 0830  BP: 133/72 (!) 143/58  Pulse: 67 86  Resp:  20  Temp: 36.5 C 36.8 C  SpO2: 95% 92%    Last Pain:  Vitals:   01/07/24 0830  TempSrc:   PainSc: Asleep                 [name]

## 2024-01-07 NOTE — Op Note (Signed)
 01/07/2024  8:14 AM  PATIENT:  Tracy Baker  68 y.o. female  PRE-OPERATIVE DIAGNOSIS:  NON RESTORABLE TEETH # 2, 6, 7, 16, 29.  POST-OPERATIVE DIAGNOSIS:  SAME  PROCEDURE:  Procedure(s): EXTRACTION TEETH # 2, 6, 7, 16, 29.  SURGEON:  Surgeon(s): Sheryle Hamilton, DMD  ANESTHESIA:   local and general  EBL:  minimal  DRAINS: none   SPECIMEN:  No Specimen  COUNTS:  YES  PLAN OF CARE: To FLOOR after PACU  PATIENT DISPOSITION:  PACU - hemodynamically stable.   PROCEDURE DETAILS: Dictation #  Hamilton EMERSON Sheryle, DMD 01/07/2024 8:14 AM

## 2024-01-07 NOTE — Anesthesia Preprocedure Evaluation (Addendum)
 Anesthesia Evaluation  Patient identified by MRN, date of birth, ID band Patient awake    Reviewed: Allergy & Precautions, H&P , NPO status , Patient's Chart, lab work & pertinent test results  Airway Mallampati: IV  TM Distance: >3 FB Neck ROM: Full    Dental  (+) Dental Advisory Given, Poor Dentition   Pulmonary Current Smoker and Patient abstained from smoking. 1ppd x many years No inhalers Snores at night, no sleep study    Pulmonary exam normal breath sounds clear to auscultation       Cardiovascular hypertension (139/77 preop, no home meds), Normal cardiovascular exam Rhythm:Regular Rate:Normal  Echo 01/01/24:  1. Left ventricular ejection fraction, by estimation, is 60 to 65%. The  left ventricle has normal function.   2. Right ventricular systolic function is normal. The right ventricular  size is normal. Mildly increased right ventricular wall thickness.   3. No left atrial/left atrial appendage thrombus was detected. The LAA  emptying velocity was 53 cm/s.   4. There is a very small, mobile echodensity on the anterior leaflet of  the mitral valve consistent with vegetation (see images 10, 29, 35). The  mitral valve is abnormal. Trivial mitral valve regurgitation. No evidence  of mitral stenosis.   5. The aortic valve is tricuspid. Aortic valve regurgitation is not  visualized. No aortic stenosis is present.   6. Agitated saline contrast bubble study was positive with shunting  observed within 3-6 cardiac cycles suggestive of interatrial shunt.   7. 3D performed of the mitral valve and demonstrates Small mobile  echodensity on A2.   Cath 01/04/24: No angiographic evidence of CAD Normal right and left heart pressures    Neuro/Psych negative neurological ROS  negative psych ROS   GI/Hepatic Neg liver ROS,GERD  Medicated and Controlled,,  Endo/Other  negative endocrine ROS    Renal/GU negative Renal ROS   negative genitourinary   Musculoskeletal negative musculoskeletal ROS (+)    Abdominal   Peds negative pediatric ROS (+)  Hematology  (+) Blood dyscrasia, anemia Hb 10.9, plt 310   Anesthesia Other Findings Candida endocarditis (MV)  Reproductive/Obstetrics negative OB ROS                              Anesthesia Physical Anesthesia Plan  ASA: 3  Anesthesia Plan: General   Post-op Pain Management: Tylenol PO (pre-op)*   Induction: Intravenous  PONV Risk Score and Plan: 2 and Ondansetron , Dexamethasone, Midazolam  and Treatment may vary due to age or medical condition  Airway Management Planned: Nasal ETT and Video Laryngoscope Planned  Additional Equipment: None  Intra-op Plan:   Post-operative Plan: Extubation in OR  Informed Consent: I have reviewed the patients History and Physical, chart, labs and discussed the procedure including the risks, benefits and alternatives for the proposed anesthesia with the patient or authorized representative who has indicated his/her understanding and acceptance.     Dental advisory given  Plan Discussed with: CRNA  Anesthesia Plan Comments:          Anesthesia Quick Evaluation

## 2024-01-08 ENCOUNTER — Encounter (HOSPITAL_COMMUNITY): Payer: Self-pay | Admitting: Oral Surgery

## 2024-01-08 DIAGNOSIS — Z98818 Other dental procedure status: Secondary | ICD-10-CM | POA: Diagnosis not present

## 2024-01-08 DIAGNOSIS — B377 Candidal sepsis: Secondary | ICD-10-CM | POA: Diagnosis not present

## 2024-01-08 DIAGNOSIS — I330 Acute and subacute infective endocarditis: Secondary | ICD-10-CM | POA: Diagnosis not present

## 2024-01-08 DIAGNOSIS — B49 Unspecified mycosis: Secondary | ICD-10-CM | POA: Diagnosis not present

## 2024-01-08 DIAGNOSIS — B3741 Candidal cystitis and urethritis: Secondary | ICD-10-CM | POA: Diagnosis not present

## 2024-01-08 DIAGNOSIS — I059 Rheumatic mitral valve disease, unspecified: Secondary | ICD-10-CM | POA: Diagnosis not present

## 2024-01-08 LAB — CBC
HCT: 37.7 % (ref 36.0–46.0)
Hemoglobin: 12.7 g/dL (ref 12.0–15.0)
MCH: 31.8 pg (ref 26.0–34.0)
MCHC: 33.7 g/dL (ref 30.0–36.0)
MCV: 94.3 fL (ref 80.0–100.0)
Platelets: 278 K/uL (ref 150–400)
RBC: 4 MIL/uL (ref 3.87–5.11)
RDW: 11.7 % (ref 11.5–15.5)
WBC: 7.5 K/uL (ref 4.0–10.5)
nRBC: 0 % (ref 0.0–0.2)

## 2024-01-08 LAB — RENAL FUNCTION PANEL
Albumin: 2.8 g/dL — ABNORMAL LOW (ref 3.5–5.0)
Anion gap: 15 (ref 5–15)
BUN: 23 mg/dL (ref 8–23)
CO2: 19 mmol/L — ABNORMAL LOW (ref 22–32)
Calcium: 9.3 mg/dL (ref 8.9–10.3)
Chloride: 104 mmol/L (ref 98–111)
Creatinine, Ser: 0.65 mg/dL (ref 0.44–1.00)
GFR, Estimated: >60 mL/min (ref >60)
Glucose, Bld: 108 mg/dL — ABNORMAL HIGH (ref 70–99)
Phosphorus: 3.5 mg/dL (ref 2.5–4.6)
Potassium: 4.1 mmol/L (ref 3.5–5.1)
Sodium: 138 mmol/L (ref 135–145)

## 2024-01-08 LAB — ABO/RH: ABO/RH(D): A POS

## 2024-01-08 LAB — MAGNESIUM: Magnesium: 1.9 mg/dL (ref 1.7–2.4)

## 2024-01-08 MED ORDER — METOPROLOL TARTRATE 12.5 MG HALF TABLET
12.5000 mg | ORAL_TABLET | Freq: Once | ORAL | Status: AC
Start: 2024-01-09 — End: 2024-01-10
  Administered 2024-01-10: 12.5 mg via ORAL
  Filled 2024-01-08 (×2): qty 1

## 2024-01-08 MED ORDER — BISACODYL 5 MG PO TBEC
5.0000 mg | DELAYED_RELEASE_TABLET | Freq: Once | ORAL | Status: DC
  Filled 2024-01-08: qty 1

## 2024-01-08 NOTE — Op Note (Unsigned)
 NAME: Pondexter, Brittnye MEDICAL RECORD NO: [id number] ACCOUNT NO: [id number] DATE OF BIRTH: [birth year] FACILITY: [facility] LOCATION: [facility] PHYSICIAN: Glendia EMERSON Primrose, DDS  Operative Report   DATE OF PROCEDURE: 01/07/2024  PREOPERATIVE DIAGNOSIS:  Nonrestorable teeth #2, #6, #7, #16, and #29 secondary to dental caries.  POSTOPERATIVE DIAGNOSIS:  Nonrestorable teeth #2, #6, #7, #16, and #29 secondary to dental caries.  PROCEDURE:  Extraction of teeth #2, #6, #7, #16, and #29.  SURGEON:  Glendia EMERSON Primrose, DDS  ANESTHESIA:  General, oral intubation.  Dr. Merla was attending.  DESCRIPTION OF PROCEDURE:  The patient was taken to the operating room and placed on the table in the supine position.  General anesthesia was administered.  An oral endotracheal tube was placed and secured.  The patient was draped for surgery.  Timeout  was performed.  The posterior pharynx was suctioned and a throat pack was placed.  2% lidocaine , 1:100,000 epinephrine was infiltrated in an inferior alveolar block on the right side of the mandible and in buccal and palatal infiltration around the  maxillary teeth to be removed.  The left side was operated first.  A 15 blade was used to make an incision around tooth #16 with a 15 blade.  The periosteum was reflected with the periosteal elevator.  The tooth was elevated with a dental elevator and  then removed with the dental forceps.  The socket was curetted and irrigated.  A Gelfoam sponge was placed and a 3-0 chromic gut suture was placed.  Then, attention was turned to the right side.  A 15 blade was used to make an incision on teeth #2, #6,  and #7.  The periosteum was reflected.  The teeth were elevated and removed from the mouth with the dental forceps.  The sockets were curetted, irrigated, packed with Gelfoam, and sutured with 3-0 chromic.  Then, tooth #29 was removed in simple fashion  using a 15 blade, periosteal elevator, 301 elevator, and dental forceps.  The socket  was curetted and irrigated.  Gelfoam sponge was placed and sutured with 3-0 chromic.  The oral cavity was irrigated and suctioned.  The throat pack was removed.  The  patient was left in care of anesthesia for extubation and transport to the recovery room and back to the floor.  ESTIMATED BLOOD LOSS:  Minimum.  COMPLICATIONS:  None.  SPECIMENS:  None.   SHW D: 01/07/2024 8:17:21 am T: 01/07/2024 8:31:00 am  JOB: [id number]/ [id number]

## 2024-01-08 NOTE — Progress Notes (Signed)
 PROGRESS NOTE  Tracy Baker FMW:[id number] DOB: 12-10-1955   PCP: Kristina Tinnie POUR, PA-C  Patient is from: Home  DOA: 12/28/2023 LOS: 6  Chief complaints Chief Complaint  Patient presents with   Candidemia     Brief Narrative / Interim history: 68 year old F with PMH of nephrolithiasis s/p ureteral stent and lithotripsy at [facility] on 10/10 with stent removal on 10/14 presented to ED after she left AMA from [facility] in [place].  Patient was hospitalized there with Candida albicans candidemia.  CT showed stranding around the right kidney and ureter.  Urology was consulted and she was started on Rocephin.  However, urine and blood cultures grew Candida albicans.  ID was consulted and she was started on Diflucan.  However, subsequent cultures continue to grow Candida albicans despite appropriate antibiotics raising concern for endocarditis.  TTE without significant finding.  On arrival here, hemodynamically stable.  Hgb 17 with HCT of 50.  Mildly elevated LFT.  Blood cultures obtained.  ID consulted.  She was started on micafungin.  TEE on 10/28 with small mobile echodensity on A2 segment of anterior mitral valve raising concern for mitral valve vegetation.  Cardiothoracic surgery consulted, and recommended dental evaluation and cardiac catheterization before MVR next week.  R/LHC on 10/31 negative.  Underwent dental extraction of teeth numbers 2, 6, 7, 16 and 29 by Dr. Sheryle on 11/3.  Plan for MVR on 11/6.   Subjective: Seen and examined earlier this morning.  No major events overnight of this morning.  No complaints.  Pain fairly controlled.   Assessment and plan: C. 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  TTE reportedly negative.  Blood cultures NGTD.  No eye involvement per ophthalmology.  TEE raises concern for mitral valve vegetation/endocarditis.  Orthopantogram with multiple dental caries and multiple missing mandibular  maxillary teeth.   -R/LHC on 10/31 negative. -Dental extraction of teeth numbers 2, 6, 7, 16 and 29 by Dr. Sheryle on 11/3 -Plan for MVR on 11/06 -Tylenol, Toradol and Dilaudid for pain control -Continue micafungin per ID -ID, Cardiology and CTS on board   Poor dentition/dental caries-orthopantogram as above. -S/p dental extraction on 11/3. - On full liquid diet.  Will clarify with dental surgery when we can advance  History of nephrolithiasis s/p ureteral stent and lithotripsy on 10/10 and stent removal on 10/14 at [facility]  Transaminitis: Likely due to the above.  He was also on Diflucan.  Resolved.   Hypocalcemia corrects to normal for hypoalbuminemia.  Normocytic anemia: Stable -Continue monitoring   Hypokalemia -Monitor replenish K and Mg as appropriate  Body mass index is 25.47 kg/m.           DVT prophylaxis:  Patient refuses Lovenox despite risk and benefit discussion.  She is ambulatory.  Code Status: Full code Family Communication: None at bedside Level of care: Telemetry Cardiac Status is: Inpatient Remains inpatient appropriate because: Fungal endocarditis   Final disposition: Home   35 minutes with more than 50% spent in reviewing records, counseling patient/family and coordinating care.  Consultants:  Infectious disease Cardiothoracic surgery Ophthalmology Cardiology Dental surgery  Procedures: 10/28-TEE 10/31-R/LHC on negative. 11/03-Dental extraction of teeth numbers 2, 6, 7, 16 and 29 by Dr. Sheryle   Microbiology summarized: 10/24-blood cultures NGTD  Objective: Vitals:   01/08/24 0032 01/08/24 0423 01/08/24 0915 01/08/24 1304  BP: (!) 115/57 134/61 121/72 (!) 147/86  Pulse: 65 81  61  Resp: 16 16 14 18   Temp: 97.8 F (36.6 C) 98.1  F (36.7 C) 97.8 F (36.6 C) 97.8 F (36.6 C)  TempSrc: Oral Oral Oral Oral  SpO2:   97%   Weight:      Height:        Examination:  GENERAL: No apparent distress.  Nontoxic. HEENT: MMM.  Vision  and hearing grossly intact.  NECK: Supple.  No apparent JVD.  RESP:  No IWOB.  Fair aeration bilaterally. CVS:  RRR. Heart sounds like mechanical valve ABD/GI/GU: BS+. Abd soft, NTND.  MSK/EXT:  Moves extremities. No apparent deformity. No edema.  SKIN: no apparent skin lesion or wound NEURO: AA.  Oriented appropriately.  No apparent focal neuro deficit. PSYCH: Calm. Normal affect.   Sch Meds:  Scheduled Meds:  bisacodyl  5 mg Oral Once   calcium carbonate  1 tablet Oral Q breakfast   [START ON 01/09/2024] metoprolol tartrate  12.5 mg Oral Once   pantoprazole   20 mg Oral Daily   sodium chloride  flush  3 mL Intravenous Q12H   Continuous Infusions:  micafungin (MYCAMINE) 150 mg in sodium chloride  0.9 % 100 mL IVPB 150 mg (01/08/24 1041)   PRN Meds:.acetaminophen **OR** acetaminophen, clonazePAM , HYDROmorphone (DILAUDID) injection, ketorolac, ondansetron , oxyCODONE-acetaminophen, sodium chloride  flush  Antimicrobials: Anti-infectives (From admission, onward)    Start     Dose/Rate Route Frequency Ordered Stop   01/07/24 0715  ceFAZolin (ANCEF) IVPB 2g/100 mL premix        2 g 200 mL/hr over 30 Minutes Intravenous On call to O.R. 01/07/24 0701 01/07/24 0816   01/07/24 0713  ceFAZolin (ANCEF) 2-4 GM/100ML-% IVPB  Status:  Discontinued       Note to Pharmacy: Effie Rily O: cabinet override      01/07/24 0713 01/07/24 0717   12/29/23 1100  micafungin (MYCAMINE) 150 mg in sodium chloride  0.9 % 100 mL IVPB        150 mg 107.5 mL/hr over 1 Hours Intravenous Every 24 hours 12/29/23 1005     12/29/23 1000  fluconazole (DIFLUCAN) IVPB 400 mg  Status:  Discontinued        400 mg 100 mL/hr over 120 Minutes Intravenous Every 24 hours 12/29/23 0424 12/29/23 1005        I have personally reviewed the following labs and images: CBC: Recent Labs  Lab 01/03/24 0342 01/04/24 0354 01/04/24 1426 01/04/24 1431 01/04/24 1432 01/08/24 0427  WBC 5.5 5.8  --   --   --  7.5  HGB 11.3*  11.2* 10.2* 10.9* 10.9* 12.7  HCT 33.4* 33.4* 30.0* 32.0* 32.0* 37.7  MCV 94.4 94.6  --   --   --  94.3  PLT 309 310  --   --   --  278   BMP &GFR Recent Labs  Lab 01/03/24 0342 01/04/24 0354 01/04/24 1426 01/04/24 1431 01/04/24 1432 01/05/24 0417 01/08/24 0427  NA 139 139 143 141 140 137 138  K 4.0 3.9 4.0 4.2 4.3 4.2 4.1  CL 103 103  --   --   --  101 104  CO2 22 23  --   --   --  23 19*  GLUCOSE 109* 127*  --   --   --  99 108*  BUN 13 18  --   --   --  21 23  CREATININE 0.71 0.63  --   --   --  0.65 0.65  CALCIUM 8.7* 9.0  --   --   --  9.0 9.3  MG 1.8 1.8  --   --   --   --  1.9  PHOS 3.6 3.9  --   --   --   --  3.5   Estimated Creatinine Clearance: 63.4 mL/min (by C-G formula based on SCr of 0.65 mg/dL). Liver & Pancreas: Recent Labs  Lab 01/03/24 0342 01/04/24 0354 01/08/24 0427  ALBUMIN 2.3* 2.4* 2.8*   No results for input(s): LIPASE, AMYLASE in the last 168 hours. No results for input(s): AMMONIA in the last 168 hours. Diabetic: No results for input(s): HGBA1C in the last 72 hours. No results for input(s): GLUCAP in the last 168 hours. Cardiac Enzymes: No results for input(s): CKTOTAL, CKMB, CKMBINDEX, TROPONINI in the last 168 hours. No results for input(s): PROBNP in the last 8760 hours. Coagulation Profile: No results for input(s): INR, PROTIME in the last 168 hours. Thyroid  Function Tests: No results for input(s): TSH, T4TOTAL, FREET4, T3FREE, THYROIDAB in the last 72 hours. Lipid Profile: No results for input(s): CHOL, HDL, LDLCALC, TRIG, CHOLHDL, LDLDIRECT in the last 72 hours. Anemia Panel: No results for input(s): VITAMINB12, FOLATE, FERRITIN, TIBC, IRON, RETICCTPCT in the last 72 hours. Urine analysis:    Component Value Date/Time   COLORURINE YELLOW 12/28/2023 1920   APPEARANCEUR HAZY (A) 12/28/2023 1920   APPEARANCEUR Clear 06/08/2022 1530   LABSPEC 1.012 12/28/2023 1920   PHURINE  5.0 12/28/2023 1920   GLUCOSEU NEGATIVE 12/28/2023 1920   HGBUR MODERATE (A) 12/28/2023 1920   BILIRUBINUR NEGATIVE 12/28/2023 1920   BILIRUBINUR Negative 11/08/2023 0848   BILIRUBINUR Negative 06/08/2022 1530   KETONESUR NEGATIVE 12/28/2023 1920   PROTEINUR NEGATIVE 12/28/2023 1920   UROBILINOGEN 0.2 11/08/2023 0848   NITRITE NEGATIVE 12/28/2023 1920   LEUKOCYTESUR TRACE (A) 12/28/2023 1920   Sepsis Labs: Invalid input(s): PROCALCITONIN, LACTICIDVEN  Microbiology: Recent Results (from the past 240 hours)  Surgical PCR screen     Status: None   Collection Time: 01/05/24 12:22 PM   Specimen: Nasal Mucosa; Nasal Swab  Result Value Ref Range Status   MRSA, PCR NEGATIVE NEGATIVE Final   Staphylococcus aureus NEGATIVE NEGATIVE Final    Comment: (NOTE) The Xpert SA Assay (FDA approved for NASAL specimens in patients 68 years of age and older), is one component of a comprehensive surveillance program. It is not intended to diagnose infection nor to guide or monitor treatment. Performed at [facility], [address] [place], [place] [address]     Radiology Studies: No results found.      Augusta Hilbert T. Karyna Bessler [facility]  If 7PM-7AM, please contact night-coverage [email or web address] 01/08/2024, 1:41 PM

## 2024-01-08 NOTE — Progress Notes (Signed)
 Pt received OHS book, Move in the Tube sheet, and OHS careguide Pt was educated on approx length of surgery and stay, importance of ambulation and using IS, restrictions, home needs, and CRPII.  Garen FORBES Candy MS, ACSM-CEP  01/08/2024 11:40 AM

## 2024-01-08 NOTE — Progress Notes (Signed)
 1 Day Post-Op Procedure(s) (LRB): DENTAL RESTORATION/EXTRACTIONS (N/A) Subjective: Complaints of pain left forearm at IV infiltration site Not much pain from dental extractions  Objective: Vital signs in last 24 hours: Temp:  [97.8 F (36.6 C)-98.1 F (36.7 C)] 97.8 F (36.6 C) (11/04 0915) Pulse Rate:  [65-87] 81 (11/04 0423) Cardiac Rhythm: Normal sinus rhythm (11/04 1015) Resp:  [14-19] 14 (11/04 0915) BP: (109-134)/(57-72) 121/72 (11/04 0915) SpO2:  [97 %] 97 % (11/04 0915)  Hemodynamic parameters for last 24 hours:    Intake/Output from previous day: 11/03 0701 - 11/04 0700 In: 716 [I.V.:500; IV Piggyback:216] Out: 5 [Blood:5] Intake/Output this shift: Total I/O In: 240 [P.O.:240] Out: -   General appearance: alert, cooperative, and no distress Heart: regular rate and rhythm Extremities: Erythema and induration IV infiltration left forearm  Lab Results: Recent Labs    01/08/24 0427  WBC 7.5  HGB 12.7  HCT 37.7  PLT 278   BMET:  Recent Labs    01/08/24 0427  NA 138  K 4.1  CL 104  CO2 19*  GLUCOSE 108*  BUN 23  CREATININE 0.65  CALCIUM 9.3    PT/INR: No results for input(s): LABPROT, INR in the last 72 hours. ABG    Component Value Date/Time   PHART 7.407 01/04/2024 1432   HCO3 27.0 01/04/2024 1432   TCO2 28 01/04/2024 1432   O2SAT 68 01/04/2024 1432   CBG (last 3)  No results for input(s): GLUCAP in the last 72 hours.  Assessment/Plan: S/P Procedure(s) (LRB): DENTAL RESTORATION/EXTRACTIONS (N/A) Candida UTI leading to fungemia and vegetation of anterior leaflet of mitral valve. Completed dental extractions yesterday without issue. Remains on IV antifungals Plan for mitral valve repair or replacement on Thursday, 01/10/2024 We discussed the procedure again.  I again informed her of the general nature of the procedure including the incisions to be used, use of cardiopulmonary bypass, the need for general anesthesia, the use of drains  tubes and temporary pacemaker wires postoperatively, the expected hospital stay, and the overall recovery.  I informed her of the indications, risks, benefits, and alternatives.  She understands the risks include, but not limited to death, MI, DVT, PE, stroke, bleeding, possible need for transfusion, infection, cardiac arrhythmias, complete heart block requiring pacemaker placement, as well as other organ system dysfunction such as renal, respiratory, or gastrointestinal complications.  She accepts the risks and agrees to proceed.  LOS: 6 days    [name] 01/08/2024

## 2024-01-08 NOTE — Plan of Care (Signed)

## 2024-01-09 DIAGNOSIS — I059 Rheumatic mitral valve disease, unspecified: Secondary | ICD-10-CM | POA: Diagnosis not present

## 2024-01-09 DIAGNOSIS — Z98811 Dental restoration status: Secondary | ICD-10-CM

## 2024-01-09 DIAGNOSIS — N390 Urinary tract infection, site not specified: Secondary | ICD-10-CM

## 2024-01-09 DIAGNOSIS — I330 Acute and subacute infective endocarditis: Secondary | ICD-10-CM

## 2024-01-09 DIAGNOSIS — B377 Candidal sepsis: Secondary | ICD-10-CM

## 2024-01-09 LAB — CBC
HCT: 36.2 % (ref 36.0–46.0)
Hemoglobin: 12.4 g/dL (ref 12.0–15.0)
MCH: 31.7 pg (ref 26.0–34.0)
MCHC: 34.3 g/dL (ref 30.0–36.0)
MCV: 92.6 fL (ref 80.0–100.0)
Platelets: 261 K/uL (ref 150–400)
RBC: 3.91 MIL/uL (ref 3.87–5.11)
RDW: 11.9 % (ref 11.5–15.5)
WBC: 5.7 K/uL (ref 4.0–10.5)
nRBC: 0 % (ref 0.0–0.2)

## 2024-01-09 LAB — BASIC METABOLIC PANEL WITH GFR
Anion gap: 11 (ref 5–15)
BUN: 22 mg/dL (ref 8–23)
CO2: 25 mmol/L (ref 22–32)
Calcium: 9 mg/dL (ref 8.9–10.3)
Chloride: 104 mmol/L (ref 98–111)
Creatinine, Ser: 0.67 mg/dL (ref 0.44–1.00)
GFR, Estimated: >60 mL/min (ref >60)
Glucose, Bld: 110 mg/dL — ABNORMAL HIGH (ref 70–99)
Potassium: 3.9 mmol/L (ref 3.5–5.1)
Sodium: 140 mmol/L (ref 135–145)

## 2024-01-09 MED ORDER — CEFAZOLIN SODIUM-DEXTROSE 2-4 GM/100ML-% IV SOLN
2.0000 g | INTRAVENOUS | Status: AC
  Administered 2024-01-10 (×2): 2 g via INTRAVENOUS
  Filled 2024-01-09: qty 100

## 2024-01-09 MED ORDER — VANCOMYCIN HCL 1250 MG/250ML IV SOLN
1250.0000 mg | INTRAVENOUS | Status: AC
  Administered 2024-01-10: 1250 mg via INTRAVENOUS
  Filled 2024-01-09: qty 250

## 2024-01-09 MED ORDER — CEFAZOLIN SODIUM-DEXTROSE 2-4 GM/100ML-% IV SOLN
2.0000 g | INTRAVENOUS | Status: DC
Start: 2024-01-10 — End: 2024-01-11
  Filled 2024-01-09: qty 100

## 2024-01-09 MED ORDER — PHENYLEPHRINE HCL-NACL 20-0.9 MG/250ML-% IV SOLN
30.0000 ug/min | INTRAVENOUS | Status: DC
Start: 2024-01-10 — End: 2024-01-11
  Filled 2024-01-09: qty 250

## 2024-01-09 MED ORDER — EPINEPHRINE HCL 5 MG/250ML IV SOLN IN NS
0.0000 ug/min | INTRAVENOUS | Status: AC
Start: 2024-01-10 — End: 2024-01-11
  Administered 2024-01-10: 3 ug/min via INTRAVENOUS
  Filled 2024-01-09: qty 250

## 2024-01-09 MED ORDER — DEXMEDETOMIDINE HCL IN NACL 400 MCG/100ML IV SOLN
0.1000 ug/kg/h | INTRAVENOUS | Status: AC
Start: 2024-01-10 — End: 2024-01-11
  Administered 2024-01-10: .5 ug/kg/h via INTRAVENOUS
  Filled 2024-01-09: qty 100

## 2024-01-09 MED ORDER — NITROGLYCERIN IN D5W 200-5 MCG/ML-% IV SOLN
2.0000 ug/min | INTRAVENOUS | Status: DC
  Filled 2024-01-09: qty 250

## 2024-01-09 MED ORDER — HEPARIN 30,000 UNITS/1000 ML (OHS) CELLSAVER SOLUTION
Status: DC
  Filled 2024-01-09: qty 1000

## 2024-01-09 MED ORDER — INSULIN REGULAR(HUMAN) IN NACL 100-0.9 UT/100ML-% IV SOLN
INTRAVENOUS | Status: AC
  Administered 2024-01-10: 1.9 [IU]/h via INTRAVENOUS
  Filled 2024-01-09: qty 100

## 2024-01-09 MED ORDER — NOREPINEPHRINE 4 MG/250ML-% IV SOLN
0.0000 ug/min | INTRAVENOUS | Status: DC
Start: 2024-01-10 — End: 2024-01-11
  Filled 2024-01-09: qty 250

## 2024-01-09 MED ORDER — POTASSIUM CHLORIDE 2 MEQ/ML IV SOLN
80.0000 meq | INTRAVENOUS | Status: DC
Start: 2024-01-10 — End: 2024-01-11
  Filled 2024-01-09: qty 40

## 2024-01-09 MED ORDER — MANNITOL 20 % IV SOLN
INTRAVENOUS | Status: DC
  Filled 2024-01-09 (×2): qty 13

## 2024-01-09 MED ORDER — MILRINONE LACTATE IN DEXTROSE 20-5 MG/100ML-% IV SOLN
0.3000 ug/kg/min | INTRAVENOUS | Status: AC
  Administered 2024-01-10: .25 ug/kg/min via INTRAVENOUS
  Filled 2024-01-09: qty 100

## 2024-01-09 MED ORDER — TRANEXAMIC ACID (OHS) BOLUS VIA INFUSION
15.0000 mg/kg | INTRAVENOUS | Status: AC
  Administered 2024-01-10: 1009.5 mg via INTRAVENOUS
  Filled 2024-01-09: qty 1010

## 2024-01-09 MED ORDER — PLASMA-LYTE A IV SOLN
INTRAVENOUS | Status: DC
  Filled 2024-01-09: qty 2.5

## 2024-01-09 MED ORDER — TRANEXAMIC ACID (OHS) PUMP PRIME SOLUTION
2.0000 mg/kg | INTRAVENOUS | Status: DC
  Filled 2024-01-09: qty 1.35

## 2024-01-09 MED ORDER — TRANEXAMIC ACID 1000 MG/10ML IV SOLN
1.5000 mg/kg/h | INTRAVENOUS | Status: AC
  Administered 2024-01-10: 1.5 mg/kg/h via INTRAVENOUS
  Filled 2024-01-09: qty 25

## 2024-01-09 NOTE — Progress Notes (Signed)
 2 Days Post-Op Procedure(s) (LRB): DENTAL RESTORATION/EXTRACTIONS (N/A) Subjective: anxious  Objective: Vital signs in last 24 hours: Temp:  [97.7 F (36.5 C)-98.2 F (36.8 C)] 98.2 F (36.8 C) (11/05 1157) Pulse Rate:  [63-64] 64 (11/05 1157) Cardiac Rhythm: Normal sinus rhythm (11/05 0700) Resp:  [17-18] 18 (11/05 1157) BP: (102-152)/(49-66) 152/66 (11/05 1157) SpO2:  [93 %-97 %] 95 % (11/05 1157)  Hemodynamic parameters for last 24 hours:    Intake/Output from previous day: 11/04 0701 - 11/05 0700 In: 240 [P.O.:240] Out: -  Intake/Output this shift: Total I/O In: 240 [P.O.:240] Out: -   General appearance: alert, cooperative, and no distress Neurologic: intact Heart: regular rate and rhythm Lungs: clear to auscultation bilaterally  Lab Results: Recent Labs    01/08/24 0427 01/09/24 0523  WBC 7.5 5.7  HGB 12.7 12.4  HCT 37.7 36.2  PLT 278 261   BMET:  Recent Labs    01/08/24 0427 01/09/24 0523  NA 138 140  K 4.1 3.9  CL 104 104  CO2 19* 25  GLUCOSE 108* 110*  BUN 23 22  CREATININE 0.65 0.67  CALCIUM 9.3 9.0    PT/INR: No results for input(s): LABPROT, INR in the last 72 hours. ABG    Component Value Date/Time   PHART 7.407 01/04/2024 1432   HCO3 27.0 01/04/2024 1432   TCO2 28 01/04/2024 1432   O2SAT 68 01/04/2024 1432   CBG (last 3)  No results for input(s): GLUCAP in the last 72 hours.  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

## 2024-01-09 NOTE — Progress Notes (Signed)
 [facility] PROGRESS NOTE    Progress Note  Tracy Baker  FMW:[id number] DOB: 03/20/1955 DOA: 12/28/2023 PCP: Kristina Tinnie POUR, PA-C     Brief Narrative:   Tracy Baker is an 68 y.o. female past medical history of nephrolithiasis status post ureteral stent and lithotripsy at [facility] on 12/14/2023 stent removal on 12/18/2023 comes into the ED after she left AGAINST MEDICAL ADVICE from [facility] as he was there for Candida on the bike and candidemia there was a concern for endocarditis 2D echo showed no significant findings, despite treatment she continued to grow Candida TEE on 01/01/2024 showed small mobile mass on the anterior mitral leaflet cardiothoracic surgeon was consulted and recommended dental evaluation with cardiac catheterization before MVR .  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 Left heart cath was unremarkable. Dental extraction done on 01/07/2024. Plan for MVR on 01/10/2024. Continue Tylenol Toradol and Dilaudid. ID was consulted she is currently on micafungin. CT surgery on board for mitral valve replacement.  Poor oral hygiene:  Status post dental extractions of multiple teeth on 01/07/2024. Continue for liquid diet.  History of nephrolithiasis: Status post stenting with lithotripsy and stent removal.  Transaminitis: Likely due to above. Now resolved.  Normocytic anemia: Noted.  Hypokalemia: Checking potassium greater than 4 magnesium greater than 2.   DVT prophylaxis: lovenox Family Communication:none Status is: Inpatient Remains inpatient appropriate because: Mitral valve replacement    Code Status:     Code Status Orders  (From admission, onward)           Start     Ordered   12/29/23 0451  Full code  Continuous        Question:  By:  Answer:  Consent: discussion documented in EHR   12/29/23 0452           Code Status History     This patient has a current code status but no historical code status.         IV Access:   Peripheral IV   Procedures and diagnostic studies:   No results found.   Medical Consultants:   None.   Subjective:    Tracy Baker little bit nervous but feels better  Objective:    Vitals:   01/08/24 1802 01/08/24 2034 01/09/24 0005 01/09/24 0447  BP: (!) 130/49 (!) 118/59 102/63 136/65  Pulse: 63   63  Resp: 17   18  Temp: 97.8 F (36.6 C) 97.9 F (36.6 C) 97.7 F (36.5 C) 98 F (36.7 C)  TempSrc: Oral Oral Oral Oral  SpO2:  97%  93%  Weight:      Height:       SpO2: 93 % O2 Flow Rate (L/min): 2 L/min   Intake/Output Summary (Last 24 hours) at 01/09/2024 0819 Last data filed at 01/08/2024 1000 Gross per 24 hour  Intake 240 ml  Output --  Net 240 ml   Filed Weights   12/29/23 0400 01/04/24 1502  Weight: 69.9 kg 67.3 kg    Exam: General exam: In no acute distress. Respiratory system: Good air movement and clear to auscultation. Cardiovascular system: S1 & S2 heard, RRR. No JVD. Gastrointestinal system: Abdomen is nondistended, soft and nontender.  Extremities: No pedal edema. Skin: No rashes, lesions or ulcers Psychiatry: Judgement and insight appear normal. Mood &  affect appropriate.    Data Reviewed:    Labs: Basic Metabolic Panel: Recent Labs  Lab 01/03/24 0342 01/04/24 0354 01/04/24 1426 01/04/24 1431 01/04/24 1432 01/05/24 0417 01/08/24 0427 01/09/24 0523  NA 139 139   < > 141 140 137 138 140  K 4.0 3.9   < > 4.2 4.3 4.2 4.1 3.9  CL 103 103  --   --   --  101 104 104  CO2 22 23  --   --   --  23 19* 25  GLUCOSE 109* 127*  --   --   --  99 108* 110*  BUN 13 18  --   --   --  21 23 22   CREATININE 0.71 0.63  --   --   --  0.65 0.65 0.67  CALCIUM 8.7* 9.0  --   --   --  9.0 9.3 9.0  MG 1.8 1.8  --   --   --    --  1.9  --   PHOS 3.6 3.9  --   --   --   --  3.5  --    < > = values in this interval not displayed.   GFR Estimated Creatinine Clearance: 63.4 mL/min (by C-G formula based on SCr of 0.67 mg/dL). Liver Function Tests: Recent Labs  Lab 01/03/24 0342 01/04/24 0354 01/08/24 0427  ALBUMIN 2.3* 2.4* 2.8*   No results for input(s): LIPASE, AMYLASE in the last 168 hours. No results for input(s): AMMONIA in the last 168 hours. Coagulation profile No results for input(s): INR, PROTIME in the last 168 hours. COVID-19 Labs  No results for input(s): DDIMER, FERRITIN, LDH, CRP in the last 72 hours.  No results found for: SARSCOV2NAA  CBC: Recent Labs  Lab 01/03/24 0342 01/04/24 0354 01/04/24 1426 01/04/24 1431 01/04/24 1432 01/08/24 0427 01/09/24 0523  WBC 5.5 5.8  --   --   --  7.5 5.7  HGB 11.3* 11.2* 10.2* 10.9* 10.9* 12.7 12.4  HCT 33.4* 33.4* 30.0* 32.0* 32.0* 37.7 36.2  MCV 94.4 94.6  --   --   --  94.3 92.6  PLT 309 310  --   --   --  278 261   Cardiac Enzymes: No results for input(s): CKTOTAL, CKMB, CKMBINDEX, TROPONINI in the last 168 hours. BNP (last 3 results) No results for input(s): PROBNP in the last 8760 hours. CBG: No results for input(s): GLUCAP in the last 168 hours. D-Dimer: No results for input(s): DDIMER in the last 72 hours. Hgb A1c: No results for input(s): HGBA1C in the last 72 hours. Lipid Profile: No results for input(s): CHOL, HDL, LDLCALC, TRIG, CHOLHDL, LDLDIRECT in the last 72 hours. Thyroid  function studies: No results for input(s): TSH, T4TOTAL, T3FREE, THYROIDAB in the last 72 hours.  Invalid input(s): FREET3 Anemia work up: No results for input(s): VITAMINB12, FOLATE, FERRITIN, TIBC, IRON, RETICCTPCT in the last 72 hours. Sepsis Labs: Recent Labs  Lab 01/03/24 0342 01/04/24 0354 01/08/24 0427 01/09/24 0523  WBC 5.5 5.8 7.5 5.7   Microbiology Recent Results  (from the past 240 hours)  Surgical PCR screen     Status: None   Collection Time: 01/05/24 12:22 PM   Specimen: Nasal Mucosa; Nasal Swab  Result Value Ref Range Status   MRSA, PCR NEGATIVE NEGATIVE Final   Staphylococcus aureus NEGATIVE NEGATIVE Final    Comment: (NOTE) The Xpert SA Assay (FDA approved for NASAL specimens in patients 70 years of age and  older), is one component of a comprehensive surveillance program. It is not intended to diagnose infection nor to guide or monitor treatment. Performed at [facility], [address] [place], [place] [address]      Medications:    bisacodyl  5 mg Oral Once   calcium carbonate  1 tablet Oral Q breakfast   metoprolol tartrate  12.5 mg Oral Once   pantoprazole   20 mg Oral Daily   sodium chloride  flush  3 mL Intravenous Q12H   Continuous Infusions:  micafungin (MYCAMINE) 150 mg in sodium chloride  0.9 % 100 mL IVPB 150 mg (01/08/24 1041)      LOS: 7 days   Tracy Baker  [facility]  01/09/2024, 8:19 AM

## 2024-01-10 ENCOUNTER — Inpatient Hospital Stay (HOSPITAL_COMMUNITY)

## 2024-01-10 ENCOUNTER — Encounter (HOSPITAL_COMMUNITY)
Admission: EM | Disposition: A | Discharge status: Rehabilitation Facility | Payer: Self-pay | Source: Home / Self Care | Attending: Thoracic Surgery (Cardiothoracic Vascular Surgery)

## 2024-01-10 ENCOUNTER — Other Ambulatory Visit: Payer: Self-pay

## 2024-01-10 ENCOUNTER — Inpatient Hospital Stay (HOSPITAL_COMMUNITY): Admitting: Anesthesiology

## 2024-01-10 ENCOUNTER — Encounter (HOSPITAL_COMMUNITY): Payer: Self-pay | Admitting: Family Medicine

## 2024-01-10 DIAGNOSIS — I059 Rheumatic mitral valve disease, unspecified: Secondary | ICD-10-CM

## 2024-01-10 DIAGNOSIS — Z952 Presence of prosthetic heart valve: Secondary | ICD-10-CM

## 2024-01-10 DIAGNOSIS — Z9889 Other specified postprocedural states: Secondary | ICD-10-CM

## 2024-01-10 HISTORY — PX: MITRAL VALVE REPAIR: SHX2039

## 2024-01-10 HISTORY — PX: INTRAOPERATIVE TRANSESOPHAGEAL ECHOCARDIOGRAM: SHX5062

## 2024-01-10 LAB — POCT I-STAT EG7
Acid-Base Excess: 2 mmol/L (ref 0.0–2.0)
Bicarbonate: 25.3 mmol/L (ref 20.0–28.0)
Calcium, Ion: 0.98 mmol/L — ABNORMAL LOW (ref 1.15–1.40)
HCT: 22 % — ABNORMAL LOW (ref 36.0–46.0)
Hemoglobin: 7.5 g/dL — ABNORMAL LOW (ref 12.0–15.0)
O2 Saturation: 83 %
Potassium: 3.6 mmol/L (ref 3.5–5.1)
Sodium: 142 mmol/L (ref 135–145)
TCO2: 26 mmol/L (ref 22–32)
pCO2, Ven: 31.4 mmHg — ABNORMAL LOW (ref 44–60)
pH, Ven: 7.514 — ABNORMAL HIGH (ref 7.25–7.43)
pO2, Ven: 42 mmHg (ref 32–45)

## 2024-01-10 LAB — POCT I-STAT, CHEM 8
BUN: 19 mg/dL (ref 8–23)
BUN: 19 mg/dL (ref 8–23)
BUN: 17 mg/dL (ref 8–23)
BUN: 15 mg/dL (ref 8–23)
BUN: 14 mg/dL (ref 8–23)
BUN: 14 mg/dL (ref 8–23)
BUN: 11 mg/dL (ref 8–23)
BUN: 12 mg/dL (ref 8–23)
Calcium, Ion: 1.25 mmol/L (ref 1.15–1.40)
Calcium, Ion: 1.14 mmol/L — ABNORMAL LOW (ref 1.15–1.40)
Calcium, Ion: 0.88 mmol/L — CL (ref 1.15–1.40)
Calcium, Ion: 0.91 mmol/L — ABNORMAL LOW (ref 1.15–1.40)
Calcium, Ion: 0.92 mmol/L — ABNORMAL LOW (ref 1.15–1.40)
Calcium, Ion: 0.89 mmol/L — CL (ref 1.15–1.40)
Calcium, Ion: 0.85 mmol/L — CL (ref 1.15–1.40)
Calcium, Ion: 1.08 mmol/L — ABNORMAL LOW (ref 1.15–1.40)
Chloride: 102 mmol/L (ref 98–111)
Chloride: 104 mmol/L (ref 98–111)
Chloride: 102 mmol/L (ref 98–111)
Chloride: 108 mmol/L (ref 98–111)
Chloride: 108 mmol/L (ref 98–111)
Chloride: 109 mmol/L (ref 98–111)
Chloride: 109 mmol/L (ref 98–111)
Chloride: 109 mmol/L (ref 98–111)
Creatinine, Ser: 0.5 mg/dL (ref 0.44–1.00)
Creatinine, Ser: 0.5 mg/dL (ref 0.44–1.00)
Creatinine, Ser: 0.4 mg/dL — ABNORMAL LOW (ref 0.44–1.00)
Creatinine, Ser: 0.4 mg/dL — ABNORMAL LOW (ref 0.44–1.00)
Creatinine, Ser: 0.4 mg/dL — ABNORMAL LOW (ref 0.44–1.00)
Creatinine, Ser: 0.4 mg/dL — ABNORMAL LOW (ref 0.44–1.00)
Creatinine, Ser: 0.3 mg/dL — ABNORMAL LOW (ref 0.44–1.00)
Creatinine, Ser: 0.4 mg/dL — ABNORMAL LOW (ref 0.44–1.00)
Glucose, Bld: 147 mg/dL — ABNORMAL HIGH (ref 70–99)
Glucose, Bld: 164 mg/dL — ABNORMAL HIGH (ref 70–99)
Glucose, Bld: 172 mg/dL — ABNORMAL HIGH (ref 70–99)
Glucose, Bld: 141 mg/dL — ABNORMAL HIGH (ref 70–99)
Glucose, Bld: 145 mg/dL — ABNORMAL HIGH (ref 70–99)
Glucose, Bld: 145 mg/dL — ABNORMAL HIGH (ref 70–99)
Glucose, Bld: 126 mg/dL — ABNORMAL HIGH (ref 70–99)
Glucose, Bld: 218 mg/dL — ABNORMAL HIGH (ref 70–99)
HCT: 31 % — ABNORMAL LOW (ref 36.0–46.0)
HCT: 27 % — ABNORMAL LOW (ref 36.0–46.0)
HCT: 26 % — ABNORMAL LOW (ref 36.0–46.0)
HCT: 24 % — ABNORMAL LOW (ref 36.0–46.0)
HCT: 21 % — ABNORMAL LOW (ref 36.0–46.0)
HCT: 23 % — ABNORMAL LOW (ref 36.0–46.0)
HCT: 22 % — ABNORMAL LOW (ref 36.0–46.0)
HCT: 26 % — ABNORMAL LOW (ref 36.0–46.0)
Hemoglobin: 10.5 g/dL — ABNORMAL LOW (ref 12.0–15.0)
Hemoglobin: 9.2 g/dL — ABNORMAL LOW (ref 12.0–15.0)
Hemoglobin: 8.8 g/dL — ABNORMAL LOW (ref 12.0–15.0)
Hemoglobin: 8.2 g/dL — ABNORMAL LOW (ref 12.0–15.0)
Hemoglobin: 7.1 g/dL — ABNORMAL LOW (ref 12.0–15.0)
Hemoglobin: 7.8 g/dL — ABNORMAL LOW (ref 12.0–15.0)
Hemoglobin: 7.5 g/dL — ABNORMAL LOW (ref 12.0–15.0)
Hemoglobin: 8.8 g/dL — ABNORMAL LOW (ref 12.0–15.0)
Potassium: 3.8 mmol/L (ref 3.5–5.1)
Potassium: 3.4 mmol/L — ABNORMAL LOW (ref 3.5–5.1)
Potassium: 5.1 mmol/L (ref 3.5–5.1)
Potassium: 4 mmol/L (ref 3.5–5.1)
Potassium: 4.2 mmol/L (ref 3.5–5.1)
Potassium: 4.2 mmol/L (ref 3.5–5.1)
Potassium: 4.1 mmol/L (ref 3.5–5.1)
Potassium: 4.3 mmol/L (ref 3.5–5.1)
Sodium: 141 mmol/L (ref 135–145)
Sodium: 140 mmol/L (ref 135–145)
Sodium: 136 mmol/L (ref 135–145)
Sodium: 141 mmol/L (ref 135–145)
Sodium: 143 mmol/L (ref 135–145)
Sodium: 144 mmol/L (ref 135–145)
Sodium: 146 mmol/L — ABNORMAL HIGH (ref 135–145)
Sodium: 145 mmol/L (ref 135–145)
TCO2: 28 mmol/L (ref 22–32)
TCO2: 25 mmol/L (ref 22–32)
TCO2: 26 mmol/L (ref 22–32)
TCO2: 26 mmol/L (ref 22–32)
TCO2: 24 mmol/L (ref 22–32)
TCO2: 26 mmol/L (ref 22–32)
TCO2: 23 mmol/L (ref 22–32)
TCO2: 22 mmol/L (ref 22–32)

## 2024-01-10 LAB — ECHO INTRAOPERATIVE TEE
Height: 64 in
Weight: 2345.69 [oz_av]

## 2024-01-10 LAB — CBC
HCT: 37.7 % (ref 36.0–46.0)
HCT: 36 % (ref 36.0–46.0)
Hemoglobin: 13.2 g/dL (ref 12.0–15.0)
Hemoglobin: 12.4 g/dL (ref 12.0–15.0)
MCH: 30.3 pg (ref 26.0–34.0)
MCH: 30.1 pg (ref 26.0–34.0)
MCHC: 35 g/dL (ref 30.0–36.0)
MCHC: 34.4 g/dL (ref 30.0–36.0)
MCV: 86.7 fL (ref 80.0–100.0)
MCV: 87.4 fL (ref 80.0–100.0)
Platelets: 144 K/uL — ABNORMAL LOW (ref 150–400)
Platelets: 149 K/uL — ABNORMAL LOW (ref 150–400)
RBC: 4.35 MIL/uL (ref 3.87–5.11)
RBC: 4.12 MIL/uL (ref 3.87–5.11)
RDW: 16 % — ABNORMAL HIGH (ref 11.5–15.5)
RDW: 17.1 % — ABNORMAL HIGH (ref 11.5–15.5)
WBC: 10.2 K/uL (ref 4.0–10.5)
WBC: 11.8 K/uL — ABNORMAL HIGH (ref 4.0–10.5)
nRBC: 0 % (ref 0.0–0.2)
nRBC: 0 % (ref 0.0–0.2)

## 2024-01-10 LAB — POCT I-STAT 7, (LYTES, BLD GAS, ICA,H+H)
Acid-Base Excess: 2 mmol/L (ref 0.0–2.0)
Acid-base deficit: 2 mmol/L (ref 0.0–2.0)
Acid-base deficit: 4 mmol/L — ABNORMAL HIGH (ref 0.0–2.0)
Acid-base deficit: 8 mmol/L — ABNORMAL HIGH (ref 0.0–2.0)
Acid-base deficit: 4 mmol/L — ABNORMAL HIGH (ref 0.0–2.0)
Acid-base deficit: 6 mmol/L — ABNORMAL HIGH (ref 0.0–2.0)
Acid-base deficit: 4 mmol/L — ABNORMAL HIGH (ref 0.0–2.0)
Acid-base deficit: 5 mmol/L — ABNORMAL HIGH (ref 0.0–2.0)
Bicarbonate: 24.9 mmol/L (ref 20.0–28.0)
Bicarbonate: 21.8 mmol/L (ref 20.0–28.0)
Bicarbonate: 20.1 mmol/L (ref 20.0–28.0)
Bicarbonate: 19 mmol/L — ABNORMAL LOW (ref 20.0–28.0)
Bicarbonate: 21.7 mmol/L (ref 20.0–28.0)
Bicarbonate: 21.1 mmol/L (ref 20.0–28.0)
Bicarbonate: 22.5 mmol/L (ref 20.0–28.0)
Bicarbonate: 20.9 mmol/L (ref 20.0–28.0)
Calcium, Ion: 0.94 mmol/L — ABNORMAL LOW (ref 1.15–1.40)
Calcium, Ion: 0.92 mmol/L — ABNORMAL LOW (ref 1.15–1.40)
Calcium, Ion: 1.07 mmol/L — ABNORMAL LOW (ref 1.15–1.40)
Calcium, Ion: 1.06 mmol/L — ABNORMAL LOW (ref 1.15–1.40)
Calcium, Ion: 0.96 mmol/L — ABNORMAL LOW (ref 1.15–1.40)
Calcium, Ion: 1.23 mmol/L (ref 1.15–1.40)
Calcium, Ion: 1.19 mmol/L (ref 1.15–1.40)
Calcium, Ion: 1.17 mmol/L (ref 1.15–1.40)
HCT: 20 % — ABNORMAL LOW (ref 36.0–46.0)
HCT: 21 % — ABNORMAL LOW (ref 36.0–46.0)
HCT: 25 % — ABNORMAL LOW (ref 36.0–46.0)
HCT: 35 % — ABNORMAL LOW (ref 36.0–46.0)
HCT: 29 % — ABNORMAL LOW (ref 36.0–46.0)
HCT: 32 % — ABNORMAL LOW (ref 36.0–46.0)
HCT: 31 % — ABNORMAL LOW (ref 36.0–46.0)
HCT: 34 % — ABNORMAL LOW (ref 36.0–46.0)
Hemoglobin: 6.8 g/dL — CL (ref 12.0–15.0)
Hemoglobin: 7.1 g/dL — ABNORMAL LOW (ref 12.0–15.0)
Hemoglobin: 8.5 g/dL — ABNORMAL LOW (ref 12.0–15.0)
Hemoglobin: 11.9 g/dL — ABNORMAL LOW (ref 12.0–15.0)
Hemoglobin: 9.9 g/dL — ABNORMAL LOW (ref 12.0–15.0)
Hemoglobin: 10.9 g/dL — ABNORMAL LOW (ref 12.0–15.0)
Hemoglobin: 10.5 g/dL — ABNORMAL LOW (ref 12.0–15.0)
Hemoglobin: 11.6 g/dL — ABNORMAL LOW (ref 12.0–15.0)
O2 Saturation: 100 %
O2 Saturation: 100 %
O2 Saturation: 100 %
O2 Saturation: 96 %
O2 Saturation: 96 %
O2 Saturation: 98 %
O2 Saturation: 97 %
O2 Saturation: 99 %
Patient temperature: 36.1
Patient temperature: 36.4
Patient temperature: 36.1
Patient temperature: 36.6
Patient temperature: 37
Potassium: 3.7 mmol/L (ref 3.5–5.1)
Potassium: 5.7 mmol/L — ABNORMAL HIGH (ref 3.5–5.1)
Potassium: 4.2 mmol/L (ref 3.5–5.1)
Potassium: 3.7 mmol/L (ref 3.5–5.1)
Potassium: 3.5 mmol/L (ref 3.5–5.1)
Potassium: 4 mmol/L (ref 3.5–5.1)
Potassium: 3.5 mmol/L (ref 3.5–5.1)
Potassium: 3.1 mmol/L — ABNORMAL LOW (ref 3.5–5.1)
Sodium: 141 mmol/L (ref 135–145)
Sodium: 140 mmol/L (ref 135–145)
Sodium: 145 mmol/L (ref 135–145)
Sodium: 147 mmol/L — ABNORMAL HIGH (ref 135–145)
Sodium: 152 mmol/L — ABNORMAL HIGH (ref 135–145)
Sodium: 151 mmol/L — ABNORMAL HIGH (ref 135–145)
Sodium: 151 mmol/L — ABNORMAL HIGH (ref 135–145)
Sodium: 150 mmol/L — ABNORMAL HIGH (ref 135–145)
TCO2: 26 mmol/L (ref 22–32)
TCO2: 23 mmol/L (ref 22–32)
TCO2: 21 mmol/L — ABNORMAL LOW (ref 22–32)
TCO2: 20 mmol/L — ABNORMAL LOW (ref 22–32)
TCO2: 23 mmol/L (ref 22–32)
TCO2: 23 mmol/L (ref 22–32)
TCO2: 24 mmol/L (ref 22–32)
TCO2: 22 mmol/L (ref 22–32)
pCO2 arterial: 28.1 mmHg — ABNORMAL LOW (ref 32–48)
pCO2 arterial: 32 mmHg (ref 32–48)
pCO2 arterial: 30.6 mmHg — ABNORMAL LOW (ref 32–48)
pCO2 arterial: 41.4 mmHg (ref 32–48)
pCO2 arterial: 41.8 mmHg (ref 32–48)
pCO2 arterial: 48.3 mmHg — ABNORMAL HIGH (ref 32–48)
pCO2 arterial: 47.4 mmHg (ref 32–48)
pCO2 arterial: 43 mmHg (ref 32–48)
pH, Arterial: 7.555 — ABNORMAL HIGH (ref 7.35–7.45)
pH, Arterial: 7.442 (ref 7.35–7.45)
pH, Arterial: 7.425 (ref 7.35–7.45)
pH, Arterial: 7.266 — ABNORMAL LOW (ref 7.35–7.45)
pH, Arterial: 7.32 — ABNORMAL LOW (ref 7.35–7.45)
pH, Arterial: 7.244 — ABNORMAL LOW (ref 7.35–7.45)
pH, Arterial: 7.282 — ABNORMAL LOW (ref 7.35–7.45)
pH, Arterial: 7.294 — ABNORMAL LOW (ref 7.35–7.45)
pO2, Arterial: 343 mmHg — ABNORMAL HIGH (ref 83–108)
pO2, Arterial: 391 mmHg — ABNORMAL HIGH (ref 83–108)
pO2, Arterial: 240 mmHg — ABNORMAL HIGH (ref 83–108)
pO2, Arterial: 93 mmHg (ref 83–108)
pO2, Arterial: 84 mmHg (ref 83–108)
pO2, Arterial: 117 mmHg — ABNORMAL HIGH (ref 83–108)
pO2, Arterial: 107 mmHg (ref 83–108)
pO2, Arterial: 128 mmHg — ABNORMAL HIGH (ref 83–108)

## 2024-01-10 LAB — GLUCOSE, CAPILLARY
Glucose-Capillary: 197 mg/dL — ABNORMAL HIGH (ref 70–99)
Glucose-Capillary: 202 mg/dL — ABNORMAL HIGH (ref 70–99)
Glucose-Capillary: 207 mg/dL — ABNORMAL HIGH (ref 70–99)
Glucose-Capillary: 240 mg/dL — ABNORMAL HIGH (ref 70–99)
Glucose-Capillary: 265 mg/dL — ABNORMAL HIGH (ref 70–99)
Glucose-Capillary: 300 mg/dL — ABNORMAL HIGH (ref 70–99)
Glucose-Capillary: 279 mg/dL — ABNORMAL HIGH (ref 70–99)

## 2024-01-10 LAB — APTT: aPTT: 34 s (ref 24–36)

## 2024-01-10 LAB — FIBRINOGEN: Fibrinogen: 239 mg/dL (ref 210–475)

## 2024-01-10 LAB — COOXEMETRY PANEL
Carboxyhemoglobin: 2 % — ABNORMAL HIGH (ref 0.5–1.5)
Methemoglobin: <0.7 % (ref 0.0–1.5)
O2 Saturation: 56.7 %
Total hemoglobin: 12 g/dL (ref 12.0–16.0)

## 2024-01-10 LAB — HEMOGLOBIN AND HEMATOCRIT, BLOOD
HCT: 24 % — ABNORMAL LOW (ref 36.0–46.0)
HCT: 24.7 % — ABNORMAL LOW (ref 36.0–46.0)
Hemoglobin: 8.3 g/dL — ABNORMAL LOW (ref 12.0–15.0)
Hemoglobin: 8.5 g/dL — ABNORMAL LOW (ref 12.0–15.0)

## 2024-01-10 LAB — MAGNESIUM: Magnesium: 3.4 mg/dL — ABNORMAL HIGH (ref 1.7–2.4)

## 2024-01-10 LAB — PROTIME-INR
INR: 1.7 — ABNORMAL HIGH (ref 0.8–1.2)
Prothrombin Time: 20.4 s — ABNORMAL HIGH (ref 11.4–15.2)

## 2024-01-10 LAB — BASIC METABOLIC PANEL WITH GFR
Anion gap: 12 (ref 5–15)
BUN: 13 mg/dL (ref 8–23)
CO2: 21 mmol/L — ABNORMAL LOW (ref 22–32)
Calcium: 7.7 mg/dL — ABNORMAL LOW (ref 8.9–10.3)
Chloride: 115 mmol/L — ABNORMAL HIGH (ref 98–111)
Creatinine, Ser: 0.88 mg/dL (ref 0.44–1.00)
GFR, Estimated: >60 mL/min (ref >60)
Glucose, Bld: 291 mg/dL — ABNORMAL HIGH (ref 70–99)
Potassium: 3.1 mmol/L — ABNORMAL LOW (ref 3.5–5.1)
Sodium: 148 mmol/L — ABNORMAL HIGH (ref 135–145)

## 2024-01-10 LAB — PREPARE RBC (CROSSMATCH)

## 2024-01-10 LAB — PLATELET COUNT
Platelets: 134 K/uL — ABNORMAL LOW (ref 150–400)
Platelets: 100 K/uL — ABNORMAL LOW (ref 150–400)

## 2024-01-10 SURGERY — REPAIR, MITRAL VALVE
Anesthesia: General

## 2024-01-10 MED ORDER — VANCOMYCIN HCL IN DEXTROSE 1-5 GM/200ML-% IV SOLN
1000.0000 mg | Freq: Once | INTRAVENOUS | Status: AC
  Administered 2024-01-10: 1000 mg via INTRAVENOUS
  Filled 2024-01-10: qty 200

## 2024-01-10 MED ORDER — SODIUM CHLORIDE 0.45 % IV SOLN: INTRAVENOUS | Status: AC | PRN

## 2024-01-10 MED ORDER — PROPOFOL 10 MG/ML IV BOLUS
INTRAVENOUS | Status: AC
  Filled 2024-01-10: qty 20

## 2024-01-10 MED ORDER — PANTOPRAZOLE SODIUM 40 MG PO TBEC
40.0000 mg | DELAYED_RELEASE_TABLET | Freq: Every day | ORAL | Status: DC
  Filled 2024-01-10: qty 1

## 2024-01-10 MED ORDER — ASPIRIN 325 MG PO TBEC
325.0000 mg | DELAYED_RELEASE_TABLET | Freq: Every day | ORAL | Status: DC
  Filled 2024-01-10: qty 1

## 2024-01-10 MED ORDER — PHENYLEPHRINE 80 MCG/ML (10ML) SYRINGE FOR IV PUSH (FOR BLOOD PRESSURE SUPPORT)
PREFILLED_SYRINGE | INTRAVENOUS | Status: AC
  Filled 2024-01-10: qty 10

## 2024-01-10 MED ORDER — ASPIRIN 81 MG PO CHEW
324.0000 mg | CHEWABLE_TABLET | Freq: Every day | ORAL | Status: DC
  Administered 2024-01-11: 324 mg
  Filled 2024-01-10: qty 4

## 2024-01-10 MED ORDER — SODIUM BICARBONATE 8.4 % IV SOLN
50.0000 meq | Freq: Once | INTRAVENOUS | Status: AC
  Administered 2024-01-10: 50 meq via INTRAVENOUS

## 2024-01-10 MED ORDER — BISACODYL 10 MG RE SUPP: 10.0000 mg | Freq: Every day | RECTAL | Status: DC

## 2024-01-10 MED ORDER — PANTOPRAZOLE SODIUM 40 MG IV SOLR
40.0000 mg | Freq: Every day | INTRAVENOUS | Status: AC
  Administered 2024-01-10 – 2024-01-11 (×2): 40 mg via INTRAVENOUS
  Filled 2024-01-10 (×2): qty 10

## 2024-01-10 MED ORDER — LACTATED RINGERS IV SOLN: INTRAVENOUS | Status: AC

## 2024-01-10 MED ORDER — CHLORHEXIDINE GLUCONATE 0.12 % MT SOLN: 15.0000 mL | Freq: Once | OROMUCOSAL | Status: AC

## 2024-01-10 MED ORDER — ROCURONIUM BROMIDE 10 MG/ML (PF) SYRINGE
PREFILLED_SYRINGE | INTRAVENOUS | Status: AC
  Filled 2024-01-10: qty 20

## 2024-01-10 MED ORDER — ASPIRIN 81 MG PO CHEW
324.0000 mg | CHEWABLE_TABLET | Freq: Once | ORAL | Status: AC
  Administered 2024-01-10: 324 mg via ORAL
  Filled 2024-01-10: qty 4

## 2024-01-10 MED ORDER — PROTAMINE SULFATE 10 MG/ML IV SOLN
INTRAVENOUS | Status: DC | PRN
  Administered 2024-01-10: 220 mg via INTRAVENOUS
  Administered 2024-01-10: 20 mg via INTRAVENOUS

## 2024-01-10 MED ORDER — PROPOFOL 10 MG/ML IV BOLUS
INTRAVENOUS | Status: DC | PRN
  Administered 2024-01-10: 50 mg via INTRAVENOUS
  Administered 2024-01-10: 30 mg via INTRAVENOUS
  Administered 2024-01-10: 40 mg via INTRAVENOUS

## 2024-01-10 MED ORDER — SODIUM CHLORIDE 0.9% FLUSH
3.0000 mL | Freq: Two times a day (BID) | INTRAVENOUS | Status: DC
  Administered 2024-01-11 – 2024-01-15 (×9): 3 mL via INTRAVENOUS

## 2024-01-10 MED ORDER — SODIUM CHLORIDE 0.9 % IV SOLN
150.0000 mg | INTRAVENOUS | Status: DC
  Filled 2024-01-10: qty 7.5

## 2024-01-10 MED ORDER — ONDANSETRON HCL 4 MG/2ML IJ SOLN
INTRAMUSCULAR | Status: DC | PRN
  Administered 2024-01-10: 4 mg via INTRAVENOUS

## 2024-01-10 MED ORDER — SODIUM CHLORIDE 0.9% IV SOLUTION: Freq: Once | INTRAVENOUS | Status: DC

## 2024-01-10 MED ORDER — LIDOCAINE 2% (20 MG/ML) 5 ML SYRINGE
INTRAMUSCULAR | Status: DC | PRN
Start: 2024-01-10 — End: 2024-01-10
  Administered 2024-01-10: 80 mg via INTRAVENOUS

## 2024-01-10 MED ORDER — DEXTROSE 50 % IV SOLN: 0.0000 mL | INTRAVENOUS | Status: DC | PRN

## 2024-01-10 MED ORDER — PROTAMINE SULFATE 10 MG/ML IV SOLN
INTRAVENOUS | Status: AC
  Filled 2024-01-10: qty 25

## 2024-01-10 MED ORDER — NOREPINEPHRINE 4 MG/250ML-% IV SOLN: 0.0000 ug/min | INTRAVENOUS | Status: DC

## 2024-01-10 MED ORDER — HEPARIN SODIUM (PORCINE) 1000 UNIT/ML IJ SOLN
INTRAMUSCULAR | Status: DC | PRN
Start: 2024-01-10 — End: 2024-01-10
  Administered 2024-01-10: 24000 [IU] via INTRAVENOUS

## 2024-01-10 MED ORDER — PAPAVERINE HCL 30 MG/ML IJ SOLN
INTRAMUSCULAR | Status: DC | PRN
  Administered 2024-01-10: 300 mL via INTRAVASCULAR

## 2024-01-10 MED ORDER — HEPARIN SODIUM (PORCINE) 1000 UNIT/ML IJ SOLN
INTRAMUSCULAR | Status: AC
  Filled 2024-01-10: qty 1

## 2024-01-10 MED ORDER — MAGNESIUM SULFATE 4 GM/100ML IV SOLN
4.0000 g | Freq: Once | INTRAVENOUS | Status: AC
  Administered 2024-01-10: 4 g via INTRAVENOUS
  Filled 2024-01-10: qty 100

## 2024-01-10 MED ORDER — FENTANYL CITRATE (PF) 250 MCG/5ML IJ SOLN
INTRAMUSCULAR | Status: AC
  Filled 2024-01-10: qty 5

## 2024-01-10 MED ORDER — SODIUM BICARBONATE 8.4 % IV SOLN
INTRAVENOUS | Status: AC
  Filled 2024-01-10: qty 50

## 2024-01-10 MED ORDER — LACTATED RINGERS IV BOLUS
500.0000 mL | Freq: Once | INTRAVENOUS | Status: AC
  Administered 2024-01-10: 500 mL via INTRAVENOUS

## 2024-01-10 MED ORDER — CHLORHEXIDINE GLUCONATE 0.12 % MT SOLN
15.0000 mL | OROMUCOSAL | Status: AC
  Administered 2024-01-10: 15 mL via OROMUCOSAL
  Filled 2024-01-10: qty 15

## 2024-01-10 MED ORDER — INSULIN REGULAR(HUMAN) IN NACL 100-0.9 UT/100ML-% IV SOLN
INTRAVENOUS | Status: DC
  Administered 2024-01-10: 23 [IU]/h via INTRAVENOUS
  Administered 2024-01-11: 32 [IU]/h via INTRAVENOUS
  Administered 2024-01-11: 20 [IU]/h via INTRAVENOUS
  Administered 2024-01-11: 26 [IU]/h via INTRAVENOUS
  Filled 2024-01-10 (×3): qty 100

## 2024-01-10 MED ORDER — LACTATED RINGERS IV SOLN: INTRAVENOUS | Status: DC | PRN

## 2024-01-10 MED ORDER — HEMOSTATIC AGENTS (NO CHARGE) OPTIME
TOPICAL | Status: DC | PRN
  Administered 2024-01-10 (×2): 1 via TOPICAL

## 2024-01-10 MED ORDER — SODIUM BICARBONATE 8.4 % IV SOLN: 50.0000 meq | Freq: Once | INTRAVENOUS | Status: AC

## 2024-01-10 MED ORDER — CHLORHEXIDINE GLUCONATE CLOTH 2 % EX PADS
6.0000 | MEDICATED_PAD | Freq: Every day | CUTANEOUS | Status: DC
  Administered 2024-01-11 – 2024-02-07 (×28): 6 via TOPICAL

## 2024-01-10 MED ORDER — PHENYLEPHRINE HCL-NACL 20-0.9 MG/250ML-% IV SOLN: 0.0000 ug/min | INTRAVENOUS | Status: DC

## 2024-01-10 MED ORDER — POTASSIUM CHLORIDE 10 MEQ/50ML IV SOLN
10.0000 meq | INTRAVENOUS | Status: AC
  Administered 2024-01-11 (×6): 10 meq via INTRAVENOUS
  Filled 2024-01-10 (×6): qty 50

## 2024-01-10 MED ORDER — MIDAZOLAM HCL (PF) 10 MG/2ML IJ SOLN
INTRAMUSCULAR | Status: AC
  Filled 2024-01-10: qty 2

## 2024-01-10 MED ORDER — CLEVIDIPINE BUTYRATE 0.5 MG/ML IV EMUL
INTRAVENOUS | Status: DC | PRN
  Administered 2024-01-10: 1 mg/h via INTRAVENOUS

## 2024-01-10 MED ORDER — FENTANYL CITRATE (PF) 50 MCG/ML IJ SOSY
50.0000 ug | PREFILLED_SYRINGE | INTRAMUSCULAR | Status: DC | PRN
  Administered 2024-01-10 – 2024-01-12 (×5): 50 ug via INTRAVENOUS
  Filled 2024-01-10 (×5): qty 1

## 2024-01-10 MED ORDER — PHENYLEPHRINE HCL-NACL 20-0.9 MG/250ML-% IV SOLN
INTRAVENOUS | Status: DC | PRN
  Administered 2024-01-10: 25 ug/min via INTRAVENOUS

## 2024-01-10 MED ORDER — ORAL CARE MOUTH RINSE
15.0000 mL | Freq: Once | OROMUCOSAL | Status: AC
Start: 2024-01-10 — End: 2024-01-10
  Administered 2024-01-10: 15 mL via OROMUCOSAL

## 2024-01-10 MED ORDER — ACETAMINOPHEN 500 MG PO TABS
1000.0000 mg | ORAL_TABLET | Freq: Four times a day (QID) | ORAL | Status: AC
  Administered 2024-01-13 – 2024-01-15 (×7): 1000 mg via ORAL
  Filled 2024-01-10 (×10): qty 2

## 2024-01-10 MED ORDER — SODIUM CHLORIDE 0.9 % IV SOLN
250.0000 mL | INTRAVENOUS | Status: AC
  Administered 2024-01-11: 250 mL via INTRAVENOUS

## 2024-01-10 MED ORDER — METOPROLOL TARTRATE 25 MG/10 ML ORAL SUSPENSION
12.5000 mg | Freq: Two times a day (BID) | ORAL | Status: DC
  Administered 2024-01-10: 12.5 mg
  Filled 2024-01-10: qty 10

## 2024-01-10 MED ORDER — EPINEPHRINE HCL 5 MG/250ML IV SOLN IN NS
2.0000 ug/min | INTRAVENOUS | Status: DC
  Administered 2024-01-11: 4 ug/min via INTRAVENOUS
  Administered 2024-01-12: 2 ug/min via INTRAVENOUS
  Filled 2024-01-10 (×2): qty 250

## 2024-01-10 MED ORDER — MILRINONE LACTATE IN DEXTROSE 20-5 MG/100ML-% IV SOLN
0.2500 ug/kg/min | INTRAVENOUS | Status: DC
  Administered 2024-01-10: 0.25 ug/kg/min via INTRAVENOUS

## 2024-01-10 MED ORDER — ONDANSETRON HCL 4 MG/2ML IJ SOLN
4.0000 mg | Freq: Four times a day (QID) | INTRAMUSCULAR | Status: DC | PRN
  Administered 2024-01-11: 4 mg via INTRAVENOUS
  Filled 2024-01-10: qty 2

## 2024-01-10 MED ORDER — CEFAZOLIN SODIUM-DEXTROSE 2-4 GM/100ML-% IV SOLN
2.0000 g | Freq: Three times a day (TID) | INTRAVENOUS | Status: AC
  Administered 2024-01-10 – 2024-01-12 (×6): 2 g via INTRAVENOUS
  Filled 2024-01-10 (×6): qty 100

## 2024-01-10 MED ORDER — SODIUM CHLORIDE 0.9 % IV SOLN: INTRAVENOUS | Status: AC

## 2024-01-10 MED ORDER — SODIUM CHLORIDE (PF) 0.9 % IJ SOLN
OROMUCOSAL | Status: DC | PRN
  Administered 2024-01-10 (×2): 4 mL
  Administered 2024-01-10: 4 mL via TOPICAL

## 2024-01-10 MED ORDER — OXYCODONE HCL 5 MG PO TABS
5.0000 mg | ORAL_TABLET | ORAL | Status: DC | PRN
  Administered 2024-01-11: 10 mg via ORAL
  Administered 2024-01-11 (×2): 5 mg via ORAL
  Filled 2024-01-10: qty 1
  Filled 2024-01-10: qty 2
  Filled 2024-01-10: qty 1

## 2024-01-10 MED ORDER — ALBUMIN HUMAN 5 % IV SOLN: INTRAVENOUS | Status: DC | PRN

## 2024-01-10 MED ORDER — MIDAZOLAM HCL (PF) 2 MG/2ML IJ SOLN
2.0000 mg | INTRAMUSCULAR | Status: DC | PRN
Start: 2024-01-10 — End: 2024-01-12
  Administered 2024-01-11: 2 mg via INTRAVENOUS
  Filled 2024-01-10: qty 2

## 2024-01-10 MED ORDER — MIDAZOLAM HCL 5 MG/5ML IJ SOLN
INTRAMUSCULAR | Status: DC | PRN
  Administered 2024-01-10: 2 mg via INTRAVENOUS
  Administered 2024-01-10: 1 mg via INTRAVENOUS
  Administered 2024-01-10 (×2): 2 mg via INTRAVENOUS

## 2024-01-10 MED ORDER — LIDOCAINE 2% (20 MG/ML) 5 ML SYRINGE
INTRAMUSCULAR | Status: AC
  Filled 2024-01-10: qty 5

## 2024-01-10 MED ORDER — METOCLOPRAMIDE HCL 5 MG/ML IJ SOLN
10.0000 mg | Freq: Four times a day (QID) | INTRAMUSCULAR | Status: AC
  Administered 2024-01-10 – 2024-01-11 (×6): 10 mg via INTRAVENOUS
  Filled 2024-01-10 (×6): qty 2

## 2024-01-10 MED ORDER — NITROGLYCERIN IN D5W 200-5 MCG/ML-% IV SOLN: 0.0000 ug/min | INTRAVENOUS | Status: DC

## 2024-01-10 MED ORDER — ALBUMIN HUMAN 5 % IV SOLN
250.0000 mL | INTRAVENOUS | Status: DC | PRN
  Administered 2024-01-10 (×4): 12.5 g via INTRAVENOUS
  Filled 2024-01-10 (×3): qty 250

## 2024-01-10 MED ORDER — NITROGLYCERIN IN D5W 200-5 MCG/ML-% IV SOLN
0.0000 ug/min | INTRAVENOUS | Status: DC
  Administered 2024-01-10 – 2024-01-11 (×2): 10 ug/min via INTRAVENOUS
  Administered 2024-01-11: 50 ug/min via INTRAVENOUS
  Filled 2024-01-10 (×3): qty 250

## 2024-01-10 MED ORDER — POTASSIUM CHLORIDE 10 MEQ/50ML IV SOLN
10.0000 meq | INTRAVENOUS | Status: AC
  Administered 2024-01-10 (×3): 10 meq via INTRAVENOUS

## 2024-01-10 MED ORDER — ACETAMINOPHEN 160 MG/5ML PO SOLN
650.0000 mg | Freq: Once | ORAL | Status: AC
  Administered 2024-01-10: 650 mg
  Filled 2024-01-10: qty 20.3

## 2024-01-10 MED ORDER — ONDANSETRON HCL 4 MG/2ML IJ SOLN
INTRAMUSCULAR | Status: AC
Start: 2024-01-10 — End: 2024-01-10
  Filled 2024-01-10: qty 2

## 2024-01-10 MED ORDER — CALCIUM CHLORIDE 10 % IV SOLN
1.0000 g | Freq: Once | INTRAVENOUS | Status: AC
  Administered 2024-01-10: 1 g via INTRAVENOUS

## 2024-01-10 MED ORDER — SODIUM CHLORIDE 0.9% FLUSH
3.0000 mL | INTRAVENOUS | Status: DC | PRN
Start: 2024-01-11 — End: 2024-01-16

## 2024-01-10 MED ORDER — LACTATED RINGERS IV SOLN: INTRAVENOUS | Status: DC

## 2024-01-10 MED ORDER — FENTANYL CITRATE (PF) 100 MCG/2ML IJ SOLN
INTRAMUSCULAR | Status: DC | PRN
  Administered 2024-01-10 (×2): 100 ug via INTRAVENOUS
  Administered 2024-01-10: 50 ug via INTRAVENOUS
  Administered 2024-01-10: 25 ug via INTRAVENOUS
  Administered 2024-01-10 (×2): 100 ug via INTRAVENOUS
  Administered 2024-01-10: 150 ug via INTRAVENOUS
  Administered 2024-01-10: 175 ug via INTRAVENOUS
  Administered 2024-01-10 (×3): 100 ug via INTRAVENOUS
  Administered 2024-01-10: 150 ug via INTRAVENOUS

## 2024-01-10 MED ORDER — ACETAMINOPHEN 160 MG/5ML PO SOLN
1000.0000 mg | Freq: Four times a day (QID) | ORAL | Status: AC
  Administered 2024-01-11 (×2): 1000 mg
  Filled 2024-01-10 (×2): qty 40.6

## 2024-01-10 MED ORDER — SODIUM BICARBONATE 8.4 % IV SOLN
100.0000 meq | Freq: Once | INTRAVENOUS | Status: AC
  Administered 2024-01-10: 100 meq via INTRAVENOUS
  Filled 2024-01-10: qty 50

## 2024-01-10 MED ORDER — BISACODYL 5 MG PO TBEC
10.0000 mg | DELAYED_RELEASE_TABLET | Freq: Every day | ORAL | Status: DC
  Administered 2024-01-11 – 2024-01-13 (×2): 10 mg via ORAL
  Filled 2024-01-10 (×3): qty 2

## 2024-01-10 MED ORDER — METOPROLOL TARTRATE 5 MG/5ML IV SOLN
2.5000 mg | INTRAVENOUS | Status: DC | PRN
  Administered 2024-01-12: 5 mg via INTRAVENOUS
  Filled 2024-01-10: qty 5

## 2024-01-10 MED ORDER — DOCUSATE SODIUM 100 MG PO CAPS
200.0000 mg | ORAL_CAPSULE | Freq: Every day | ORAL | Status: DC
  Administered 2024-01-11 – 2024-02-06 (×21): 200 mg via ORAL
  Administered 2024-02-07: 100 mg via ORAL
  Filled 2024-01-10 (×25): qty 2

## 2024-01-10 MED ORDER — TRAMADOL HCL 50 MG PO TABS: 50.0000 mg | ORAL_TABLET | ORAL | Status: DC | PRN

## 2024-01-10 MED ORDER — DEXMEDETOMIDINE HCL IN NACL 400 MCG/100ML IV SOLN
0.0000 ug/kg/h | INTRAVENOUS | Status: DC
  Administered 2024-01-10: 0.7 ug/kg/h via INTRAVENOUS
  Administered 2024-01-11: 0.6 ug/kg/h via INTRAVENOUS
  Filled 2024-01-10 (×2): qty 100

## 2024-01-10 MED ORDER — METOPROLOL TARTRATE 12.5 MG HALF TABLET: 12.5000 mg | ORAL_TABLET | Freq: Two times a day (BID) | ORAL | Status: DC

## 2024-01-10 MED ORDER — 0.9 % SODIUM CHLORIDE (POUR BTL) OPTIME
TOPICAL | Status: DC | PRN
  Administered 2024-01-10: 4000 mL
  Administered 2024-01-10: 1000 mL
  Administered 2024-01-10: 2000 mL

## 2024-01-10 MED ORDER — MILRINONE LACTATE IN DEXTROSE 20-5 MG/100ML-% IV SOLN
0.3750 ug/kg/min | INTRAVENOUS | Status: DC
  Administered 2024-01-10 – 2024-01-11 (×2): 0.375 ug/kg/min via INTRAVENOUS
  Filled 2024-01-10: qty 100

## 2024-01-10 MED ORDER — PRAVASTATIN SODIUM 10 MG PO TABS
20.0000 mg | ORAL_TABLET | Freq: Every day | ORAL | Status: DC
  Administered 2024-01-10: 20 mg via ORAL
  Filled 2024-01-10 (×2): qty 2

## 2024-01-10 MED ORDER — ROCURONIUM BROMIDE 10 MG/ML (PF) SYRINGE
PREFILLED_SYRINGE | INTRAVENOUS | Status: DC | PRN
  Administered 2024-01-10: 30 mg via INTRAVENOUS
  Administered 2024-01-10 (×2): 20 mg via INTRAVENOUS
  Administered 2024-01-10: 50 mg via INTRAVENOUS
  Administered 2024-01-10: 30 mg via INTRAVENOUS
  Administered 2024-01-10 (×2): 50 mg via INTRAVENOUS

## 2024-01-10 SURGICAL SUPPLY — 79 items
ADAPTER CARDIO PERF ANTE/RETRO (ADAPTER) ×1 IMPLANT
BAG DECANTER FOR FLEXI CONT (MISCELLANEOUS) ×1 IMPLANT
BLADE CLIPPER SURG (BLADE) ×1 IMPLANT
BLADE STERNUM SYSTEM 6 (BLADE) ×1 IMPLANT
BLADE SURG 15 STRL LF DISP TIS (BLADE) ×1 IMPLANT
CANISTER SUCTION 3000ML PPV (SUCTIONS) ×1 IMPLANT
CANNULA AORTIC ROOT 9FR (CANNULA) ×1 IMPLANT
CANNULA ARTERIAL NVNT 3/8 22FR (MISCELLANEOUS) IMPLANT
CANNULA EZ GLIDE AORTIC 21FR (CANNULA) IMPLANT
CANNULA GUNDRY RCSP 15FR (MISCELLANEOUS) IMPLANT
CANNULA SUMP PERICARDIAL (CANNULA) ×1 IMPLANT
CANNULA VRC MALB SNGL STG 28FR (MISCELLANEOUS) IMPLANT
CANNULA VRC MALB SNGL STG 36FR (MISCELLANEOUS) IMPLANT
CATH ROBINSON RED A/P 18FR (CATHETERS) IMPLANT
CATH THORACIC 28FR (CATHETERS) IMPLANT
CLIP FOGARTY SPRING 6M (CLIP) IMPLANT
CLIP TI WIDE RED SMALL 24 (CLIP) IMPLANT
CNTNR URN SCR LID CUP LEK RST (MISCELLANEOUS) IMPLANT
CONN 1/2X1/2X1/2 BEN (MISCELLANEOUS) ×1 IMPLANT
CONN ST 1/2X1/2 BEN (MISCELLANEOUS) IMPLANT
CONN ST 1/4X3/8 BEN (MISCELLANEOUS) IMPLANT
CONN ST 3/8 X 1/2 (MISCELLANEOUS) ×2 IMPLANT
CONN Y 3/8X3/8X3/8 BEN (MISCELLANEOUS) IMPLANT
CONTAINER PROTECT SURGISLUSH (MISCELLANEOUS) ×2 IMPLANT
DRAPE SRG 135X102X78XABS (DRAPES) ×1 IMPLANT
DRAPE WARM FLUID 44X44 (DRAPES) ×1 IMPLANT
DRSG COVADERM 4X14 (GAUZE/BANDAGES/DRESSINGS) ×1 IMPLANT
ELECT CAUTERY BLADE 6.4 (BLADE) IMPLANT
ELECTRODE REM PT RTRN 9FT ADLT (ELECTROSURGICAL) ×2 IMPLANT
FELT TEFLON 1X6 (MISCELLANEOUS) ×2 IMPLANT
GAUZE 4X4 16PLY ~~LOC~~+RFID DBL (SPONGE) ×1 IMPLANT
GAUZE SPONGE 4X4 12PLY STRL (GAUZE/BANDAGES/DRESSINGS) ×2 IMPLANT
GLOVE SS BIOGEL STRL SZ 7.5 (GLOVE) ×1 IMPLANT
GLOVE SURG SIGNA 7.5 PF LTX (GLOVE) IMPLANT
GOWN STRL REUS W/ TWL LRG LVL3 (GOWN DISPOSABLE) ×4 IMPLANT
HEMOSTAT POWDER SURGIFOAM 1G (HEMOSTASIS) ×3 IMPLANT
HEMOSTAT SURGICEL 2X14 (HEMOSTASIS) IMPLANT
KIT BASIN OR (CUSTOM PROCEDURE TRAY) ×1 IMPLANT
KIT SUCTION CATH 14FR (SUCTIONS) ×1 IMPLANT
KIT SUT CK MINI COMBO 4X17 (Prosthesis & Implant Heart) IMPLANT
KIT TURNOVER KIT B (KITS) ×1 IMPLANT
LINE VENT (MISCELLANEOUS) IMPLANT
LOOP VASCLR EXTRA MAXI WHITE (MISCELLANEOUS) ×1 IMPLANT
PACK OPEN HEART (CUSTOM PROCEDURE TRAY) ×1 IMPLANT
PAD ARMBOARD POSITIONER FOAM (MISCELLANEOUS) ×2 IMPLANT
POSITIONER HEAD DONUT 9IN (MISCELLANEOUS) ×1 IMPLANT
SET MPS 3-ND DEL (MISCELLANEOUS) IMPLANT
SOLN 0.9% NACL POUR BTL 1000ML (IV SOLUTION) ×4 IMPLANT
SOLN STERILE WATER BTL 1000 ML (IV SOLUTION) ×2 IMPLANT
SOLUTION ANTFG W/FOAM PAD STRL (MISCELLANEOUS) IMPLANT
SUT ETHIBOND 2 0 SH (SUTURE) IMPLANT
SUT ETHIBOND 2 0 SH 36X2 (SUTURE) ×2 IMPLANT
SUT ETHIBOND 2 0 V4 (SUTURE) IMPLANT
SUT ETHIBOND 2 0V4 GREEN (SUTURE) IMPLANT
SUT ETHIBOND 4 0 RB 1 (SUTURE) IMPLANT
SUT GORETEX CV-5 36 IN (SUTURE) IMPLANT
SUT PROLENE 3 0 SH1 36 (SUTURE) ×1 IMPLANT
SUT PROLENE 4 0 SH DA (SUTURE) IMPLANT
SUT PROLENE 4-0 RB1 .5 CRCL 36 (SUTURE) ×2 IMPLANT
SUT PROLENE 5 0 C 1 36 (SUTURE) ×2 IMPLANT
SUT SILK 1 MH (SUTURE) ×2 IMPLANT
SUT SILK 1 TIES 10X30 (SUTURE) ×1 IMPLANT
SUT SILK 2 0 SH CR/8 (SUTURE) ×2 IMPLANT
SUT SILK 2-0 18XBRD TIE 12 (SUTURE) ×1 IMPLANT
SUT SILK 3 0 SH CR/8 (SUTURE) ×1 IMPLANT
SUT STEEL 6MS V (SUTURE) IMPLANT
SUT TEM PAC WIRE 2 0 SH (SUTURE) ×4 IMPLANT
SUT VIC AB 1 CTX36XBRD ANBCTR (SUTURE) IMPLANT
SUT VIC AB 2-0 CTX 27 (SUTURE) IMPLANT
SUT VIC AB 3-0 X1 27 (SUTURE) IMPLANT
SUTURE EB EXC GRN/WHT 2-0 D/A (SUTURE) ×1 IMPLANT
SYSTEM SAHARA CHEST DRAIN ATS (WOUND CARE) ×1 IMPLANT
TAPE CLOTH SOFT 2X10 (GAUZE/BANDAGES/DRESSINGS) IMPLANT
TAPE CLOTH SURG 4X10 WHT LF (GAUZE/BANDAGES/DRESSINGS) IMPLANT
TOWEL GREEN STERILE (TOWEL DISPOSABLE) ×1 IMPLANT
TOWEL GREEN STERILE FF (TOWEL DISPOSABLE) ×1 IMPLANT
TRAY FOLEY SLVR 16FR TEMP STAT (SET/KITS/TRAYS/PACK) ×1 IMPLANT
TUBE SUCT INTRACARD DLP 20F (MISCELLANEOUS) ×1 IMPLANT
UNDERPAD 30X36 HEAVY ABSORB (UNDERPADS AND DIAPERS) ×1 IMPLANT

## 2024-01-10 NOTE — Anesthesia Procedure Notes (Signed)
 Arterial Line Insertion Start/End11/08/2023 7:15 AM, 01/10/2024 7:25 AM Performed by: Epifanio Charleston, MD, Maleeyah Mccaughey Laren, CRNA, CRNA  Patient location: Pre-op. Preanesthetic checklist: patient identified, IV checked, site marked, risks and benefits discussed, surgical consent, monitors and equipment checked, pre-op evaluation, timeout performed and anesthesia consent Lidocaine  1% used for infiltration and patient sedated Left, radial was placed Catheter size: 20 G Hand hygiene performed  and maximum sterile barriers used  Allen's test indicative of satisfactory collateral circulation Attempts: 2 Procedure performed using ultrasound to evaluate access site. Ultrasound Notes:relevant anatomy identified, ultrasound used to visualize needle entry and vessel patent under ultrasound. Following insertion, dressing applied and Biopatch. Post procedure assessment: normal and unchanged  Patient tolerated the procedure well with no immediate complications.

## 2024-01-10 NOTE — Anesthesia Preprocedure Evaluation (Signed)
 Anesthesia Evaluation  Patient identified by MRN, date of birth, ID band Patient awake    Reviewed: Allergy & Precautions, NPO status , Patient's Chart, lab work & pertinent test results  Airway Mallampati: II  TM Distance: >3 FB Neck ROM: Full    Dental   Pulmonary Current Smoker and Patient abstained from smoking.   breath sounds clear to auscultation       Cardiovascular  Rhythm:Regular Rate:Normal  Infective endocarditis   Neuro/Psych negative neurological ROS     GI/Hepatic Neg liver ROS,GERD  Medicated,,  Endo/Other  negative endocrine ROS    Renal/GU negative Renal ROS     Musculoskeletal   Abdominal   Peds  Hematology negative hematology ROS (+)   Anesthesia Other Findings   Reproductive/Obstetrics                              Anesthesia Physical Anesthesia Plan  ASA: 4  Anesthesia Plan: General   Post-op Pain Management:    Induction: Intravenous  PONV Risk Score and Plan: 2 and Treatment may vary due to age or medical condition  Airway Management Planned: Oral ETT and Video Laryngoscope Planned  Additional Equipment: Arterial line, CVP, PA Cath, TEE and Ultrasound Guidance Line Placement  Intra-op Plan:   Post-operative Plan: Post-operative intubation/ventilation  Informed Consent: I have reviewed the patients History and Physical, chart, labs and discussed the procedure including the risks, benefits and alternatives for the proposed anesthesia with the patient or authorized representative who has indicated his/her understanding and acceptance.     Dental advisory given  Plan Discussed with: CRNA  Anesthesia Plan Comments:         Anesthesia Quick Evaluation

## 2024-01-10 NOTE — Progress Notes (Signed)
 TCTS PM Rounding Progress Note  CI 1.8 with epi at 4, milrinone 0.25 and +/- nitroglycerin.  BP has been labile, has gotten 3 bottles of albumin and 3 amps of bicarb.  Most recent gas: 7.23/50/123/21/-6.  Making good urine, chest tube output low.  Vitals:   01/10/24 1907 01/10/24 1908  BP:    Pulse: 89 89  Resp: 16 16  Temp: (!) 97.2 F (36.2 C) (!) 97.2 F (36.2 C)  SpO2: 97% 97%    Plan: - Increased RR for hypercarbia - Will give 1 albumin and another amp of bicarb - Recheck gas at 8:30 pm  Con Clunes, MD Cardiothoracic Surgery Pager: [phone]

## 2024-01-10 NOTE — Progress Notes (Signed)
 Verified for previous shift to reflect most accurate I&O

## 2024-01-10 NOTE — Anesthesia Postprocedure Evaluation (Signed)
 Anesthesia Post Note  Patient: Nashea Chumney  Procedure(s) Performed: REPAIR, MITRAL VALVE ECHOCARDIOGRAM, TRANSESOPHAGEAL, INTRAOPERATIVE     Patient location during evaluation: SICU Anesthesia Type: General Level of consciousness: sedated Pain management: pain level controlled Vital Signs Assessment: post-procedure vital signs reviewed and stable Respiratory status: patient remains intubated per anesthesia plan Cardiovascular status: stable Postop Assessment: no apparent nausea or vomiting Anesthetic complications: no   No notable events documented.  Last Vitals:  Vitals:   01/10/24 1546 01/10/24 1600  BP: 114/63 96/70  Pulse: 80 80  Resp: 16 16  Temp:    SpO2: 93% 95%    Last Pain:  Vitals:   01/10/24 0706  TempSrc:   PainSc: 0-No pain                 Epifanio Lamar BRAVO

## 2024-01-10 NOTE — Anesthesia Procedure Notes (Signed)
 Central Venous Catheter Insertion Performed by: Epifanio Charleston, MD, anesthesiologist Start/End11/08/2023 7:25 AM, 01/10/2024 7:35 AM Patient location: Pre-op. Preanesthetic checklist: patient identified, IV checked, site marked, risks and benefits discussed, surgical consent, monitors and equipment checked, pre-op evaluation, timeout performed and anesthesia consent Hand hygiene performed  and maximum sterile barriers used  PA cath was placed.Swan type:thermodilution PA Cath depth:45 Attempts: 1 Patient tolerated the procedure well with no immediate complications.

## 2024-01-10 NOTE — Hospital Course (Addendum)
 History of Present Illness:  [name] is a 68 year old female with no significant past medical history prior to events this month.  She underwent lithotripsy and placement of a right ureteral stent on 12/14/2023 at [facility].  She subsequently had the stent removed 4 days later.  Shortly after that, she developed fever, chills, and malaise but went on to awaiting a fit in [place].  While there, her symptoms worsened prompting visit to the emergency room at [facility]  on 12/22/2023.  A CT scan obtained at that facility demonstrated tissue stranding around the right kidney and right ureter.  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  She was started on Diflucan  and treated there for several days.  She decided she wanted to complete treatment in [facility] so left the [facility] AGAINST MEDICAL ADVICE on 12/29/2023 and drove herself to [place].  She was seen in the emergency room here at [facility] and admitted by the hospitalist service. IV Diflucan  was resumed.  Repeat blood and urine cultures obtained here are negative to date.  She may have by the infectious disease service and switched from fluconazole  to micafungin .  An ophthalmology consult was provided by Dr. Zerita Salinas who conducted a funduscopic exam with dilation showing no evidence of fungal endophthalmitis. Transesophageal echo obtained 11/06 showing normal biventricular function. The report states there was a very small, mobile echodensity on the anterior leaflet of the mitral valve consistent with vegetation  with trivial MR.  There are no dimensions of the lesion given in the report but it appears to be about 4 mm in diameter.    [name] is currently resting in bed with her daughter at the bedside. She is awake and alert and denies having any discomfort although she admitted to feeling anxious.  She continues to work a couple days a week.  She thinks her last  dental visit was over a year ago and said she did not have any current dental concerns.  Dr. Kerrin reviewed the patient's diagnostic studies and determined she would benefit from surgical intervention. He reviewed the treatment options as well as the risks and benefits of surgery with the patient. [name] was agreeable to proceed with surgery.  Hospital Course: The patient remained stable while inpatient at [facility]. She underwent dental consult and extraction was recommended. Dental extraction took place on 11/03 by Dr. Sheryle without complication.   She was then brought to the operating room and underwent mitral valve vegetation removal and mitral valve repair on 11/07. She tolerated the procedure well and was transferred to the SICU in stable condition. She was extubated early POD1 without complication. 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 She had a junctional rhythm and remained DDD paced at 80. She had expected acute postop blood loss anemia and was transfused. Drips were weaned as hemodynamics tolerated. She developed pulmonary edema and was diuresed. She developed thrombocytopenia HITT ab was negative. She developed elevated LFTs likely due to hepatic congestion, this improved with time. She was started on Cefepime  and Linezolid  on 11/12 for possible aspiration PNA/HAP.  She required milrinone  for hemodynamic support. Milrinone  was weaned as hemodynamics tolerated. Advanced heart failure determined diuretic regimen. PT/OT evaluations recommended CIR, CIR began following. She developed atrial fibrillation with RVR and was started on IV Amiodarone . Thrombocytopenia improved and she was started on Lovenox . SHe remained in atrial fibrillation on 11/15 and was started on Apixaban . She developed recurrent epistaxis.  Aspirin  was discontinued, Afrin was ordered and ENT was consulted. They recommended Afrin for active bleeding and applied surgiflow to the left  nare. She was brought for DCCV but this was not done due to severe mitral regurgitation on echocardiogram. She would require mitral valve replacement surgery, Dr. Kerrin was hopeful that she could go to CIR and go home and return in 3 weeks for redo surgery. She was in rate controlled atrial fibrillation and IV Amiodarone  was transitioned to PO Amiodarone . Her thrombocytopenia again worsened without clear source but improved with time. She remained in rate controlled atrial fibrillation on PO Amiodarone . She had poor oral intake, oral diet was encouraged and she was started on boost supplement shakes. She continued to complain of dry mouth, magic mouthwash was started, scopolamine  patch was discontinued and Torsemide  was held for 2 days. She was felt stable for transfer to 2C progressive unit on 11/24. Dry mouth and appetite improved after being started on magic mouthwash and biotene. Her ambulation was progressing well and she was saturating well on room air. Her bowels were moving appropriately. Her incisions are healing well without sign of infection. Unfortunately the patient's insurance denied placement in CIR. She developed epistaxis again, Eliquis  was held and ENT packed both nares with surgicel packing. She was started on Ancef  for empiric toxic shock prophylaxis She underwent cardiac catheterization on 12/02 which showed normal filling pressures, significant mitral regurgitation, and moderately reduced output. She was restarted on Milrinone  0.125, nausea/vomiting and AKI improved. It was felt she would require redo surgery while in the hospitalized since she was well optimized and requiring milrinone . Dr. Kerrin reviewed the surgery as well and treatment options and the risks and benefits of surgery extensively with the patient and family. The patient was agreeable to proceed with surgery.  The patient was brought to the operating room on 12/05 and underwent redo sternotomy and mitral valve  replacement utilizing a 27mm Mitris Resilia Mitral Valve.  She was transfused with 4 units of packed red blood cells, 2 units of platelets, 2 fresh frozen plasma, and 1 unit of cryoprecipitate perioperatively due to significant coagulopathy.  Following the procedure, she was transferred to the surgical ICU on milrinone , epinephrine , and norepinephrine .  Overnight after surgery, she required escalation of the inotropic and vasopressor support.  She was evaluated by the advanced heart failure team on postop day 1.  She was felt to have worsening heart failure consistent with predominant RV dysfunction.  Nitric oxide  was added.  Later on postop day 1, decision was made to cannulate for V-PA ECMO. vasopressor support to be weaned following initiation of ECMO. She became oliguric, continuous Lasix  infusion was initiated at 20 mg/h.  On postop day 2, her INR was elevated 5.9.  She was transfused with additional fresh frozen plasma.  DIC panel was negative.  She was noted to have asymmetrical opacity on the chest x-ray.  Critical care team evaluated this with bronchoscopy.  She continued to require full ventilator support.  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 ECMO was weaned and decannulated on 12/08. She developed probable RUL pneumonia and was started on vancomycin  and meropenem . She developed likely consumptive thrombocytopenia which was monitored closely. She developed a junctional rhythm and remained paced DDD at 80. Sedation was weaned and she began following commands. Ventilator was weaned per PCCM and she was extubated on 12/15. iNO was weaned off on 02/13/24. She developed thrombocytopenia which was closely monitored. Pleural tubes were removed  on 12/10 without complication. She developed afib with controlled rate, pacer was transitioned to VVI 50. She developed an ileus but was tolerated tube feeds, reglan  and miralax  were started. Low dose anticoagulation was  started on 12/14 for her prosthetic mitral valve and afib. She was started on CRRT 12/13 for uremia. 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 CRRT was stopped on 12/16 and she was started on HD. She was tolerating tube feeds, she passed her swallow evaluation and was started on PO diet.  She has been given IV Lasix  with Metolazone  with marginal urinary output and dialysis was continued.  She is extremely weak requiring max assist to get out of bed.  PT/OT continue to work with patient and again recommended inpatient rehab. She was started on a Dobutamine  drip on 12/17. Intermittent HD was done on 12/20 and will be done on 12/22. She had tunneled HD catheter placed on 12/22 by IR. She remained on dobutamine  which was weaned off on 12/29. Epicardial pacing wires were removed without complication on 12/22. PO intake was increased as tolerated and she remained on cycled tube feeds. Foley catheter was removed on 12/23. She was hyperkalemic and given Lokelma . The patient reported she did not want PEG placement, dietician was contacted for retrial of nocturnal feeds and calorie count.  She is showing some improvement in this regard with more of an appetite and less food intolerance. ID recommended IV fluconazole  until discharge and then recommended PO Fluconazole  400mg  daily at discharge with an end date of 03/20/24. They arranged follow up appointment. She was transitioned from heparin  to Eliquis  for stroke prevention with atrial fibrillation. She reached her dry weight with HD so nephrology monitored for renal recovery. Tube feed rate was decreased and she was left on nocturnal tube feeds to hopefully increase PO intake since she did not want PEG tube placed. Her coox remained stable with discontinuation of dobutamine . Her ambulation was progressing with PT/OT and she was saturating well on room air. Her incisions were  healing well without sign of infection and her bowels were moving appropriately. She was felt stable for discharge to CIR and CIR insurance authorization was pending.  The patient's insurance required peer to peer authorization.  This resulted in her stay being approved.  She is stable for discharge to CIR today.

## 2024-01-10 NOTE — Transfer of Care (Signed)
 Immediate Anesthesia Transfer of Care Note  Patient: Tracy Baker  Procedure(s) Performed: REPAIR, MITRAL VALVE ECHOCARDIOGRAM, TRANSESOPHAGEAL, INTRAOPERATIVE  Patient Location: ICU  Anesthesia Type:General  Level of Consciousness: Patient remains intubated per anesthesia plan  Airway & Oxygen Therapy: Patient remains intubated per anesthesia plan and Patient placed on Ventilator (see vital sign flow sheet for setting)  Post-op Assessment: Report given to RN and Post -op Vital signs reviewed and stable  Post vital signs: Reviewed and stable  Last Vitals:  Vitals Value Taken Time  BP 114/63 01/10/24 15:46  Temp 36.2 C 01/10/24 15:53  Pulse 80 01/10/24 15:53  Resp 18 01/10/24 15:53  SpO2 94 % 01/10/24 15:53  Vitals shown include unfiled device data.  Last Pain:  Vitals:   01/10/24 0706  TempSrc:   PainSc: 0-No pain      Patients Stated Pain Goal: 0 (01/04/24 1500)  Complications: No notable events documented.

## 2024-01-10 NOTE — Plan of Care (Signed)

## 2024-01-10 NOTE — Anesthesia Procedure Notes (Signed)
 Procedure Name: Intubation Date/Time: 01/10/2024 8:19 AM  Performed by: Lettie Derrek Dacosta, CRNAPre-anesthesia Checklist: Patient identified, Patient being monitored, Timeout performed, Emergency Drugs available and Suction available Patient Re-evaluated:Patient Re-evaluated prior to induction Oxygen Delivery Method: Circle System Utilized Preoxygenation: Pre-oxygenation with 100% oxygen Induction Type: IV induction Ventilation: Mask ventilation without difficulty Laryngoscope Size: 3 and Glidescope Grade View: Grade I Tube type: Oral Tube size: 8.0 mm Number of attempts: 1 Airway Equipment and Method: Stylet Placement Confirmation: ETT inserted through vocal cords under direct vision, positive ETCO2 and breath sounds checked- equal and bilateral Secured at: 21 cm Tube secured with: Tape Dental Injury: Teeth and Oropharynx as per pre-operative assessment

## 2024-01-10 NOTE — Brief Op Note (Signed)
 12/28/2023 - 01/10/2024  2:40 PM  PATIENT:  Tracy Baker  68 y.o. female  PRE-OPERATIVE DIAGNOSIS:  MITRAL VALVE ENDOCARDITIS  POST-OPERATIVE DIAGNOSIS:  MITRAL VALVE ENDOCARDITIS  PROCEDURE:  Procedure(s): REPAIR, MITRAL VALVE (N/A) ECHOCARDIOGRAM, TRANSESOPHAGEAL, INTRAOPERATIVE (N/A)  SURGEON:  Surgeons and Role:    DEWAINE Kerrin Elspeth JAYSON, MD - Primary  PHYSICIAN ASSISTANT: Con Bend PA-C  ASSISTANTS: Luke Stacks RNFA   ANESTHESIA:   general  EBL:  {None/Minimal: 21241}   BLOOD ADMINISTERED:{BLOOD GIVEN TYPES AND AMOUNTS:20467}  DRAINS: Mediastinal drains   LOCAL MEDICATIONS USED:  NONE  SPECIMEN:  Source of Specimen:  Mitral valve vegetation  DISPOSITION OF SPECIMEN:  Pathology, culture  COUNTS:  YES  TOURNIQUET:  * No tourniquets in log *  DICTATION: .Dragon Dictation  PLAN OF CARE: Admit to inpatient   PATIENT DISPOSITION:  ICU - intubated and hemodynamically stable.   Delay start of Pharmacological VTE agent (>24hrs) due to surgical blood loss or risk of bleeding: yes

## 2024-01-10 NOTE — Anesthesia Procedure Notes (Signed)
 Central Venous Catheter Insertion Performed by: Epifanio Charleston, MD, anesthesiologist Start/End11/08/2023 7:25 AM, 01/10/2024 7:35 AM Patient location: Pre-op. Preanesthetic checklist: patient identified, IV checked, site marked, risks and benefits discussed, surgical consent, monitors and equipment checked, pre-op evaluation, timeout performed and anesthesia consent Position: Trendelenburg Lidocaine  1% used for infiltration and patient sedated Hand hygiene performed , maximum sterile barriers used  and Seldinger technique used Catheter size: 9 Fr Total catheter length 10. Central line and PA cath was placed.MAC introducer Swan type:thermodilution PA Cath depth:45 Procedure performed using ultrasound to evaluate access site. Ultrasound Notes:relevant anatomy identified, ultrasound used to visualize needle entry, vessel patent under ultrasound and image(s) printed for medical record. Attempts: 1 Following insertion, line sutured and dressing applied. Post procedure assessment: blood return through all ports, free fluid flow and no air  Patient tolerated the procedure well with no immediate complications.

## 2024-01-10 NOTE — Progress Notes (Signed)
 Patient transferred from OR on 2H10 monitor. Art line entered as AO - art line documentation did not cross over to electronic chart. Documents containing AO pressure (aka artline) printed from central monitor system and in patient's paper chart.

## 2024-01-10 NOTE — Interval H&P Note (Signed)
 History and Physical Interval Note:  01/10/2024 7:45 AM  Tracy Baker  has presented today for surgery, with the diagnosis of MITRAL VALVE ENDOCARDITIS.  The various methods of treatment have been discussed with the patient and family. After consideration of risks, benefits and other options for treatment, the patient has consented to  Procedure(s): REPAIR, MITRAL VALVE (N/A) REPLACEMENT, MITRAL VALVE (N/A) ECHOCARDIOGRAM, TRANSESOPHAGEAL, INTRAOPERATIVE (N/A) as a surgical intervention.  The patient's history has been reviewed, patient examined, no change in status, stable for surgery.  I have reviewed the patient's chart and labs.  Questions were answered to the patient's satisfaction.     [name]

## 2024-01-11 ENCOUNTER — Inpatient Hospital Stay (HOSPITAL_COMMUNITY)

## 2024-01-11 ENCOUNTER — Encounter (HOSPITAL_COMMUNITY): Payer: Self-pay | Admitting: Thoracic Surgery (Cardiothoracic Vascular Surgery)

## 2024-01-11 ENCOUNTER — Other Ambulatory Visit: Payer: Self-pay | Admitting: Cardiology

## 2024-01-11 DIAGNOSIS — B376 Candidal endocarditis: Secondary | ICD-10-CM

## 2024-01-11 DIAGNOSIS — Z9889 Other specified postprocedural states: Secondary | ICD-10-CM

## 2024-01-11 DIAGNOSIS — Z792 Long term (current) use of antibiotics: Secondary | ICD-10-CM

## 2024-01-11 DIAGNOSIS — B377 Candidal sepsis: Secondary | ICD-10-CM | POA: Diagnosis not present

## 2024-01-11 DIAGNOSIS — Z9911 Dependence on respirator [ventilator] status: Secondary | ICD-10-CM

## 2024-01-11 LAB — GLUCOSE, CAPILLARY
Glucose-Capillary: 111 mg/dL — ABNORMAL HIGH (ref 70–99)
Glucose-Capillary: 112 mg/dL — ABNORMAL HIGH (ref 70–99)
Glucose-Capillary: 123 mg/dL — ABNORMAL HIGH (ref 70–99)
Glucose-Capillary: 139 mg/dL — ABNORMAL HIGH (ref 70–99)
Glucose-Capillary: 143 mg/dL — ABNORMAL HIGH (ref 70–99)
Glucose-Capillary: 147 mg/dL — ABNORMAL HIGH (ref 70–99)
Glucose-Capillary: 154 mg/dL — ABNORMAL HIGH (ref 70–99)
Glucose-Capillary: 176 mg/dL — ABNORMAL HIGH (ref 70–99)
Glucose-Capillary: 179 mg/dL — ABNORMAL HIGH (ref 70–99)
Glucose-Capillary: 197 mg/dL — ABNORMAL HIGH (ref 70–99)
Glucose-Capillary: 226 mg/dL — ABNORMAL HIGH (ref 70–99)
Glucose-Capillary: 229 mg/dL — ABNORMAL HIGH (ref 70–99)
Glucose-Capillary: 238 mg/dL — ABNORMAL HIGH (ref 70–99)
Glucose-Capillary: 257 mg/dL — ABNORMAL HIGH (ref 70–99)
Glucose-Capillary: 92 mg/dL (ref 70–99)
Glucose-Capillary: 92 mg/dL (ref 70–99)
Glucose-Capillary: 98 mg/dL (ref 70–99)

## 2024-01-11 LAB — BASIC METABOLIC PANEL WITH GFR
Anion gap: 12 (ref 5–15)
Anion gap: 12 (ref 5–15)
BUN: 15 mg/dL (ref 8–23)
BUN: 16 mg/dL (ref 8–23)
CO2: 18 mmol/L — ABNORMAL LOW (ref 22–32)
CO2: 20 mmol/L — ABNORMAL LOW (ref 22–32)
Calcium: 7.8 mg/dL — ABNORMAL LOW (ref 8.9–10.3)
Calcium: 7.5 mg/dL — ABNORMAL LOW (ref 8.9–10.3)
Chloride: 116 mmol/L — ABNORMAL HIGH (ref 98–111)
Chloride: 113 mmol/L — ABNORMAL HIGH (ref 98–111)
Creatinine, Ser: 1.04 mg/dL — ABNORMAL HIGH (ref 0.44–1.00)
Creatinine, Ser: 0.93 mg/dL (ref 0.44–1.00)
GFR, Estimated: 59 mL/min — ABNORMAL LOW (ref >60)
GFR, Estimated: >60 mL/min (ref >60)
Glucose, Bld: 207 mg/dL — ABNORMAL HIGH (ref 70–99)
Glucose, Bld: 87 mg/dL (ref 70–99)
Potassium: 3.3 mmol/L — ABNORMAL LOW (ref 3.5–5.1)
Potassium: 4.4 mmol/L (ref 3.5–5.1)
Sodium: 146 mmol/L — ABNORMAL HIGH (ref 135–145)
Sodium: 145 mmol/L (ref 135–145)

## 2024-01-11 LAB — CBC
HCT: 35.6 % — ABNORMAL LOW (ref 36.0–46.0)
HCT: 30.8 % — ABNORMAL LOW (ref 36.0–46.0)
Hemoglobin: 12.5 g/dL (ref 12.0–15.0)
Hemoglobin: 10.8 g/dL — ABNORMAL LOW (ref 12.0–15.0)
MCH: 30.4 pg (ref 26.0–34.0)
MCH: 30.1 pg (ref 26.0–34.0)
MCHC: 35.1 g/dL (ref 30.0–36.0)
MCHC: 35.1 g/dL (ref 30.0–36.0)
MCV: 86.6 fL (ref 80.0–100.0)
MCV: 85.8 fL (ref 80.0–100.0)
Platelets: 144 K/uL — ABNORMAL LOW (ref 150–400)
Platelets: 90 K/uL — ABNORMAL LOW (ref 150–400)
RBC: 4.11 MIL/uL (ref 3.87–5.11)
RBC: 3.59 MIL/uL — ABNORMAL LOW (ref 3.87–5.11)
RDW: 17.5 % — ABNORMAL HIGH (ref 11.5–15.5)
RDW: 17.3 % — ABNORMAL HIGH (ref 11.5–15.5)
WBC: 11.3 K/uL — ABNORMAL HIGH (ref 4.0–10.5)
WBC: 11.5 K/uL — ABNORMAL HIGH (ref 4.0–10.5)
nRBC: 0 % (ref 0.0–0.2)
nRBC: 0 % (ref 0.0–0.2)

## 2024-01-11 LAB — POCT I-STAT 7, (LYTES, BLD GAS, ICA,H+H)
Acid-base deficit: 6 mmol/L — ABNORMAL HIGH (ref 0.0–2.0)
Acid-base deficit: 7 mmol/L — ABNORMAL HIGH (ref 0.0–2.0)
Acid-base deficit: 5 mmol/L — ABNORMAL HIGH (ref 0.0–2.0)
Acid-base deficit: 5 mmol/L — ABNORMAL HIGH (ref 0.0–2.0)
Bicarbonate: 21.3 mmol/L (ref 20.0–28.0)
Bicarbonate: 18.5 mmol/L — ABNORMAL LOW (ref 20.0–28.0)
Bicarbonate: 20 mmol/L (ref 20.0–28.0)
Bicarbonate: 19.3 mmol/L — ABNORMAL LOW (ref 20.0–28.0)
Calcium, Ion: 1.19 mmol/L (ref 1.15–1.40)
Calcium, Ion: 1.19 mmol/L (ref 1.15–1.40)
Calcium, Ion: 1.18 mmol/L (ref 1.15–1.40)
Calcium, Ion: 1.16 mmol/L (ref 1.15–1.40)
HCT: 30 % — ABNORMAL LOW (ref 36.0–46.0)
HCT: 34 % — ABNORMAL LOW (ref 36.0–46.0)
HCT: 32 % — ABNORMAL LOW (ref 36.0–46.0)
HCT: 32 % — ABNORMAL LOW (ref 36.0–46.0)
Hemoglobin: 10.2 g/dL — ABNORMAL LOW (ref 12.0–15.0)
Hemoglobin: 11.6 g/dL — ABNORMAL LOW (ref 12.0–15.0)
Hemoglobin: 10.9 g/dL — ABNORMAL LOW (ref 12.0–15.0)
Hemoglobin: 10.9 g/dL — ABNORMAL LOW (ref 12.0–15.0)
O2 Saturation: 95 %
O2 Saturation: 97 %
O2 Saturation: 96 %
O2 Saturation: 92 %
Patient temperature: 36.9
Patient temperature: 37.2
Patient temperature: 37.3
Patient temperature: 37.1
Potassium: 3.3 mmol/L — ABNORMAL LOW (ref 3.5–5.1)
Potassium: 3.1 mmol/L — ABNORMAL LOW (ref 3.5–5.1)
Potassium: 3.2 mmol/L — ABNORMAL LOW (ref 3.5–5.1)
Potassium: 3.1 mmol/L — ABNORMAL LOW (ref 3.5–5.1)
Sodium: 151 mmol/L — ABNORMAL HIGH (ref 135–145)
Sodium: 151 mmol/L — ABNORMAL HIGH (ref 135–145)
Sodium: 151 mmol/L — ABNORMAL HIGH (ref 135–145)
Sodium: 150 mmol/L — ABNORMAL HIGH (ref 135–145)
TCO2: 23 mmol/L (ref 22–32)
TCO2: 20 mmol/L — ABNORMAL LOW (ref 22–32)
TCO2: 21 mmol/L — ABNORMAL LOW (ref 22–32)
TCO2: 20 mmol/L — ABNORMAL LOW (ref 22–32)
pCO2 arterial: 48.1 mmHg — ABNORMAL HIGH (ref 32–48)
pCO2 arterial: 37 mmHg (ref 32–48)
pCO2 arterial: 36.3 mmHg (ref 32–48)
pCO2 arterial: 32.1 mmHg (ref 32–48)
pH, Arterial: 7.254 — ABNORMAL LOW (ref 7.35–7.45)
pH, Arterial: 7.308 — ABNORMAL LOW (ref 7.35–7.45)
pH, Arterial: 7.352 (ref 7.35–7.45)
pH, Arterial: 7.387 (ref 7.35–7.45)
pO2, Arterial: 89 mmHg (ref 83–108)
pO2, Arterial: 99 mmHg (ref 83–108)
pO2, Arterial: 89 mmHg (ref 83–108)
pO2, Arterial: 64 mmHg — ABNORMAL LOW (ref 83–108)

## 2024-01-11 LAB — COOXEMETRY PANEL
Carboxyhemoglobin: 2.2 % — ABNORMAL HIGH (ref 0.5–1.5)
Carboxyhemoglobin: 2 % — ABNORMAL HIGH (ref 0.5–1.5)
Methemoglobin: <0.7 % (ref 0.0–1.5)
Methemoglobin: 1.3 % (ref 0.0–1.5)
O2 Saturation: 63.7 %
O2 Saturation: 60.6 %
Total hemoglobin: 11.6 g/dL — ABNORMAL LOW (ref 12.0–16.0)
Total hemoglobin: 12.5 g/dL (ref 12.0–16.0)

## 2024-01-11 LAB — COMPREHENSIVE METABOLIC PANEL WITH GFR
ALT: 29 U/L (ref 0–44)
AST: 107 U/L — ABNORMAL HIGH (ref 15–41)
Albumin: 3 g/dL — ABNORMAL LOW (ref 3.5–5.0)
Alkaline Phosphatase: 160 U/L — ABNORMAL HIGH (ref 38–126)
Anion gap: 14 (ref 5–15)
BUN: 16 mg/dL (ref 8–23)
CO2: 21 mmol/L — ABNORMAL LOW (ref 22–32)
Calcium: 7.9 mg/dL — ABNORMAL LOW (ref 8.9–10.3)
Chloride: 111 mmol/L (ref 98–111)
Creatinine, Ser: 0.98 mg/dL (ref 0.44–1.00)
GFR, Estimated: >60 mL/min (ref >60)
Glucose, Bld: 120 mg/dL — ABNORMAL HIGH (ref 70–99)
Potassium: 4.2 mmol/L (ref 3.5–5.1)
Sodium: 146 mmol/L — ABNORMAL HIGH (ref 135–145)
Total Bilirubin: 2.2 mg/dL — ABNORMAL HIGH (ref 0.0–1.2)
Total Protein: 4.6 g/dL — ABNORMAL LOW (ref 6.5–8.1)

## 2024-01-11 LAB — BPAM PLATELET PHERESIS
Blood Product Expiration Date: 202511072359
ISSUE DATE / TIME: 202511061430
Unit Type and Rh: 7300

## 2024-01-11 LAB — HEMOGLOBIN A1C
Hgb A1c MFr Bld: 5.4 % (ref 4.8–5.6)
Mean Plasma Glucose: 108.28 mg/dL

## 2024-01-11 LAB — PREPARE PLATELET PHERESIS: Unit division: 0

## 2024-01-11 LAB — MAGNESIUM
Magnesium: 2.7 mg/dL — ABNORMAL HIGH (ref 1.7–2.4)
Magnesium: 2.3 mg/dL (ref 1.7–2.4)

## 2024-01-11 MED ORDER — FUROSEMIDE 10 MG/ML IJ SOLN
40.0000 mg | Freq: Once | INTRAMUSCULAR | Status: AC
  Administered 2024-01-11: 40 mg via INTRAVENOUS
  Filled 2024-01-11: qty 4

## 2024-01-11 MED ORDER — HYDROMORPHONE HCL 2 MG PO TABS
1.0000 mg | ORAL_TABLET | ORAL | Status: DC | PRN
  Administered 2024-01-11: 1 mg via ORAL
  Filled 2024-01-11: qty 1

## 2024-01-11 MED ORDER — NICARDIPINE HCL IN NACL 20-0.86 MG/200ML-% IV SOLN: 3.0000 mg/h | INTRAVENOUS | Status: DC

## 2024-01-11 MED ORDER — NICARDIPINE HCL IN NACL 20-0.86 MG/200ML-% IV SOLN
INTRAVENOUS | Status: AC
Start: 2024-01-11 — End: 2024-01-11
  Administered 2024-01-11: 5 mg/h via INTRAVENOUS
  Filled 2024-01-11: qty 200

## 2024-01-11 MED ORDER — MILRINONE LACTATE IN DEXTROSE 20-5 MG/100ML-% IV SOLN
0.1250 ug/kg/min | INTRAVENOUS | Status: DC
  Administered 2024-01-11 – 2024-01-14 (×6): 0.25 ug/kg/min via INTRAVENOUS
  Administered 2024-01-15: 0.125 ug/kg/min via INTRAVENOUS
  Administered 2024-01-15: 0.25 ug/kg/min via INTRAVENOUS
  Filled 2024-01-11 (×7): qty 100

## 2024-01-11 MED ORDER — PROMETHAZINE (PHENERGAN) 6.25MG IN NS 50ML IVPB
6.2500 mg | Freq: Four times a day (QID) | INTRAVENOUS | Status: DC | PRN
  Administered 2024-01-11 – 2024-01-13 (×5): 6.25 mg via INTRAVENOUS
  Filled 2024-01-11: qty 6.25
  Filled 2024-01-11: qty 50
  Filled 2024-01-11 (×3): qty 6.25
  Filled 2024-01-11: qty 50

## 2024-01-11 MED ORDER — POTASSIUM CHLORIDE 20 MEQ PO PACK
40.0000 meq | PACK | Freq: Once | ORAL | Status: AC
  Administered 2024-01-11: 40 meq
  Filled 2024-01-11: qty 2

## 2024-01-11 MED ORDER — SODIUM CHLORIDE 0.9 % IV SOLN
150.0000 mg | Freq: Every day | INTRAVENOUS | Status: DC
  Administered 2024-01-11 – 2024-01-14 (×4): 150 mg via INTRAVENOUS
  Filled 2024-01-11 (×5): qty 7.5

## 2024-01-11 MED ORDER — LORAZEPAM 2 MG/ML IJ SOLN
1.0000 mg | Freq: Four times a day (QID) | INTRAMUSCULAR | Status: DC | PRN
  Administered 2024-01-11: 1 mg via INTRAVENOUS
  Filled 2024-01-11: qty 1

## 2024-01-11 MED ORDER — SCOPOLAMINE 1 MG/3DAYS TD PT72
1.0000 | MEDICATED_PATCH | TRANSDERMAL | Status: DC
  Administered 2024-01-11: 1 mg via TRANSDERMAL
  Filled 2024-01-11: qty 1

## 2024-01-11 MED ORDER — POTASSIUM CHLORIDE 10 MEQ/50ML IV SOLN
10.0000 meq | INTRAVENOUS | Status: AC
  Administered 2024-01-11 (×4): 10 meq via INTRAVENOUS
  Filled 2024-01-11 (×4): qty 50

## 2024-01-11 MED ORDER — CLONAZEPAM 0.5 MG PO TABS: 0.5000 mg | ORAL_TABLET | Freq: Every day | ORAL | Status: DC

## 2024-01-11 MED ORDER — SODIUM BICARBONATE 8.4 % IV SOLN
50.0000 meq | Freq: Once | INTRAVENOUS | Status: AC
  Administered 2024-01-11: 50 meq via INTRAVENOUS
  Filled 2024-01-11: qty 50

## 2024-01-11 MED ORDER — SCOPOLAMINE 1 MG/3DAYS TD PT72
1.0000 | MEDICATED_PATCH | TRANSDERMAL | Status: AC
Start: 2024-01-11 — End: 2024-01-14
  Administered 2024-01-11: 1 mg via TRANSDERMAL
  Filled 2024-01-11: qty 1

## 2024-01-11 MED ORDER — LORAZEPAM 2 MG/ML IJ SOLN: 1.0000 mg | Freq: Four times a day (QID) | INTRAMUSCULAR | Status: DC

## 2024-01-11 MED ORDER — LORAZEPAM 2 MG/ML IJ SOLN
1.0000 mg | Freq: Once | INTRAMUSCULAR | Status: AC
  Administered 2024-01-11: 1 mg via INTRAVENOUS
  Filled 2024-01-11: qty 1

## 2024-01-11 MED ORDER — SCOPOLAMINE 1 MG/3DAYS TD PT72
2.0000 | MEDICATED_PATCH | TRANSDERMAL | Status: DC
  Administered 2024-01-14 – 2024-01-23 (×4): 2 mg via TRANSDERMAL
  Filled 2024-01-11 (×4): qty 2

## 2024-01-11 MED ORDER — INSULIN ASPART 100 UNIT/ML IJ SOLN
0.0000 [IU] | INTRAMUSCULAR | Status: DC
  Administered 2024-01-11 (×2): 2 [IU] via SUBCUTANEOUS
  Administered 2024-01-12: 3 [IU] via SUBCUTANEOUS
  Administered 2024-01-12: 2 [IU] via SUBCUTANEOUS

## 2024-01-11 MED ORDER — ENOXAPARIN SODIUM 40 MG/0.4ML IJ SOSY
40.0000 mg | PREFILLED_SYRINGE | Freq: Every day | INTRAMUSCULAR | Status: DC
  Administered 2024-01-11: 40 mg via SUBCUTANEOUS
  Filled 2024-01-11: qty 0.4

## 2024-01-11 MED ORDER — LACTATED RINGERS IV BOLUS
500.0000 mL | Freq: Once | INTRAVENOUS | Status: AC
  Administered 2024-01-11: 500 mL via INTRAVENOUS

## 2024-01-11 MED FILL — Heparin Sodium (Porcine) Inj 1000 Unit/ML: Qty: 1000 | Status: AC

## 2024-01-11 MED FILL — Potassium Chloride Inj 2 mEq/ML: INTRAVENOUS | Qty: 40 | Status: AC

## 2024-01-11 MED FILL — Lidocaine HCl Local Preservative Free (PF) Inj 2%: INTRAMUSCULAR | Qty: 14 | Status: AC

## 2024-01-11 NOTE — Progress Notes (Signed)
   [address], [place] [employer] [address]             [phone]   POD # 1  Significant nausea and some vomiting   BP (!) 141/78   Pulse 89   Temp 98.6 F (37 C)   Resp (!) 22   Ht 5' 4 (1.626 m)   Wt 79.5 kg   SpO2 90%   BMI 30.08 kg/m  33/17 CI 1.9 CVP 11 Milrinone 0.25, epi down to 2 due to hypertension   Intake/Output Summary (Last 24 hours) at 01/11/2024 1658 Last data filed at 01/11/2024 1600 Gross per 24 hour  Intake 5278.36 ml  Output 1225 ml  Net 4053.36 ml   12 PM BMET- K 4.4, creatinine 0.93  PM labs pending  Will give Lasix IV NTG for BP, did not tolerate nicardipine drip due to shunting/ low sats Off insulin drip  Elspeth C. Kerrin, MD [facility] [phone]

## 2024-01-11 NOTE — Progress Notes (Signed)
 Ordering 6 week echo post MVR. Results to Dr. Kerrin and Dr. Verlin

## 2024-01-11 NOTE — Plan of Care (Signed)
  Problem: Pain Managment: Goal: General experience of comfort will improve and/or be controlled Outcome: Progressing   Problem: Cardiovascular: Goal: Ability to achieve and maintain adequate cardiovascular perfusion will improve Outcome: Progressing Note: Frequent consults with Dr. Daniel regarding hemodynamics, orders followed and reassessed.   Problem: Education: Goal: Knowledge of disease or condition will improve Outcome: Progressing   Problem: Activity: Goal: Risk for activity intolerance will decrease Outcome: Progressing   Problem: Cardiac: Goal: Will achieve and/or maintain hemodynamic stability Outcome: Progressing Note: Continue to monitor hemodynamics, consult MD    Problem: Respiratory: Goal: Respiratory status will improve Outcome: Progressing   Problem: Skin Integrity: Goal: Wound healing without signs and symptoms of infection Outcome: Progressing

## 2024-01-11 NOTE — Inpatient Diabetes Management (Signed)
 Inpatient Diabetes Program Recommendations  AACE/ADA: New Consensus Statement on Inpatient Glycemic Control (2015)  Target Ranges:  Prepandial:   less than 140 mg/dL      Peak postprandial:   less than 180 mg/dL (1-2 hours)      Critically ill patients:  140 - 180 mg/dL   Lab Results  Component Value Date   GLUCAP 123 (H) 01/11/2024   HGBA1C 5.8 (H) 06/13/2023    Review of Glycemic Control  Latest Reference Range & Units 01/11/24 02:45 01/11/24 04:21 01/11/24 05:16 01/11/24 06:18 01/11/24 07:54 01/11/24 08:53 01/11/24 10:01  Glucose-Capillary 70 - 99 mg/dL 770 (H) 802 (H) 823 (H) 179 (H) 154 (H) 147 (H) 123 (H)  (H): Data is abnormally high Diabetes history: PreDM Outpatient Diabetes medications: none Current orders for Inpatient glycemic control: IV insulin  Inpatient Diabetes Program Recommendations:    Consider adding A1C?? Nothing noted since 06/2023.   Continue IV insulin at this time given significant drip rates.   Thanks, Tinnie Minus, MSN, RNC-OB Diabetes Coordinator [phone] (8a-5p)

## 2024-01-11 NOTE — Progress Notes (Signed)
 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 Nitroglycerin gtt at 125  Resting in bed. Extubated earlier today.   Objective:   Weight Range: 79.5 kg Body mass index is 30.08 kg/m.   Vital Signs:   Temp:  [95 F (35 C)-99.1 F (37.3 C)] 98.6 F (37 C) (11/07 1145) Pulse Rate:  [68-93] 89 (11/07 1145) Resp:  [11-37] 22 (11/07 1145) BP: (83-148)/(55-86) 141/78 (11/07 1100) SpO2:  [90 %-100 %] 90 % (11/07 1145) Arterial Line BP: (62-244)/(37-232) 157/71 (11/07 1145) FiO2 (%):  [40 %-50 %] 40 % (11/07 0800) Weight:  [79.5 kg] 79.5 kg (11/07 0355) Last BM Date : 01/09/24  Weight change: Filed Weights   01/10/24 0526 01/10/24 0651 01/11/24 0355  Weight: 66.5 kg 66.5 kg 79.5 kg    Intake/Output:   Intake/Output Summary (Last 24 hours) at 01/11/2024 1317 Last data filed at 01/11/2024 1200 Gross per 24 hour  Intake 8980.95 ml  Output 3770 ml  Net 5210.95 ml    Physical Exam  General:  elderly appearing.   Neck: JVD difficult to see. Swann R IJ Cor: Regular rate & rhythm. No murmurs. + chest tubes Lungs: clear, diminished bases Extremities: no edema  GU: + foley Neuro: alert & oriented x 3. Affect flat.   Telemetry   Paced 90 (Personally reviewed)    Labs    CBC Recent Labs    01/10/24 2144 01/10/24 2323 01/11/24 0417 01/11/24 0423 01/11/24 0757 01/11/24 0856  WBC 11.8*  --  11.3*  --   --   --   HGB 12.4   < > 12.5   < > 10.9* 10.9*  HCT 36.0   < > 35.6*   < > 32.0* 32.0*  MCV 87.4  --  86.6  --   --   --   PLT 149*  --  144*  --   --   --    < > = values in this interval not displayed.   Basic Metabolic Panel Recent Labs     01/10/24 2144 01/10/24 2323 01/11/24 0417 01/11/24 0423 01/11/24 0757 01/11/24 0856  NA 148*   < > 146*   < > 151* 150*  K 3.1*   < > 3.3*   < > 3.2* 3.1*  CL 115*  --  116*  --   --   --   CO2 21*  --  18*  --   --   --   GLUCOSE 291*  --  207*  --   --   --   BUN 13  --  15  --   --   --   CREATININE 0.88  --  1.04*  --   --   --   CALCIUM 7.7*  --  7.8*  --   --   --   MG 3.4*  --  2.7*  --   --   --    < > = values in this interval not displayed.   Liver Function Tests No results for input(s): AST, ALT, ALKPHOS, BILITOT, PROT, ALBUMIN in the last 72 hours. No results for input(s): LIPASE, AMYLASE  in the last 72 hours. Cardiac Enzymes No results for input(s): CKTOTAL, CKMB, CKMBINDEX, TROPONINI in the last 72 hours.  BNP: BNP (last 3 results) No results for input(s): BNP in the last 8760 hours.  ProBNP (last 3 results) No results for input(s): PROBNP in the last 8760 hours.   D-Dimer No results for input(s): DDIMER in the last 72 hours. Hemoglobin A1C No results for input(s): HGBA1C in the last 72 hours. Fasting Lipid Panel No results for input(s): CHOL, HDL, LDLCALC, TRIG, CHOLHDL, LDLDIRECT in the last 72 hours. Thyroid  Function Tests No results for input(s): TSH, T4TOTAL, T3FREE, THYROIDAB in the last 72 hours.  Invalid input(s): FREET3  Other results:   Imaging    DG Chest Port 1 View Result Date: 01/11/2024 EXAM: 1 VIEW(S) XRAY OF THE CHEST 01/11/2024 05:52:00 AM COMPARISON: 01/10/2024 CLINICAL HISTORY: S/P MVR (mitral valve repair) FINDINGS: LINES, TUBES AND DEVICES: Endotracheal tube in place with tip 3.3 cm above the carina. Gastric tube in place, coursing below the diaphragm with tip outside field of view. Right IJ Swan-Ganz catheter in place with tip projecting over main pulmonary artery. LUNGS AND PLEURA: Small bilateral pleural effusions with associated bibasilar opacities. Mild pulmonary edema. No  pneumothorax. HEART AND MEDIASTINUM: No acute abnormality of the cardiac and mediastinal silhouettes. BONES AND SOFT TISSUES: Nondisplaced median sternotomy wires. IMPRESSION: 1. Small bilateral pleural effusions with associated bibasilar opacities, likely atelectasis versus aspiration or infection. 2. Mild pulmonary edema. Electronically signed by: Katheleen Faes MD 01/11/2024 09:51 AM EST RP Workstation: [id number]   DG Chest Port 1 View Result Date: 01/10/2024 EXAM: 1 VIEW(S) XRAY OF THE CHEST 01/10/2024 04:08:00 PM COMPARISON: 12/28/2023 CLINICAL HISTORY: S/P MVR (mitral valve repair) FINDINGS: LINES, TUBES AND DEVICES: Endotracheal tube in place with tip 2.2 cm above carina. Enteric tube in place with tip extending below the diaphragm and terminating below field of view. Right internal jugular Swan-Ganz catheter in place with tip overlying expected area of main pulmonary artery. Left basilar chest tube in place. Mediastinal drain in place. LUNGS AND PLEURA: Low lung volumes. Mild left basilar atelectasis. No pulmonary edema. No pleural effusion. No pneumothorax. HEART AND MEDIASTINUM: Post-CABG changes. No acute abnormality of the cardiac and mediastinal silhouettes. BONES AND SOFT TISSUES: Intact sternotomy wires. No acute osseous abnormality. IMPRESSION: 1. Low lung volumes with mild left basilar atelectasis. 2. Postoperative support devices and lines in expected positions. Electronically signed by: Taylor Stroud MD 01/10/2024 04:25 PM EST RP Workstation: [id number]     Medications:     Scheduled Medications:  sodium chloride    Intravenous Once   acetaminophen  1,000 mg Oral Q6H   Or   acetaminophen (TYLENOL) oral liquid 160 mg/5 mL  1,000 mg Per Tube Q6H   aspirin EC  325 mg Oral Daily   Or   aspirin  324 mg Per Tube Daily   bisacodyl  10 mg Oral Daily   Or   bisacodyl  10 mg Rectal Daily   Chlorhexidine Gluconate Cloth  6 each Topical Daily   docusate sodium  200 mg Oral Daily    enoxaparin (LOVENOX) injection  40 mg Subcutaneous QHS   metoCLOPramide (REGLAN) injection  10 mg Intravenous Q6H   metoprolol tartrate  12.5 mg Oral BID   Or   metoprolol tartrate  12.5 mg Per Tube BID   [START ON 01/12/2024] pantoprazole   40 mg Oral Daily   pantoprazole  (PROTONIX ) IV  40 mg Intravenous QHS   pravastatin  20 mg Oral q1800  sodium chloride  flush  3 mL Intravenous Q12H    Infusions:  sodium chloride      sodium chloride  1 mL/hr at 01/11/24 1100   sodium chloride  10 mL/hr at 01/11/24 1100   albumin human 999 mL/hr at 01/11/24 1000    ceFAZolin (ANCEF) IV Stopped (01/11/24 0834)   dexmedetomidine (PRECEDEX) IV infusion Stopped (01/11/24 0726)   epinephrine 4 mcg/min (01/11/24 1100)   insulin 7 Units/hr (01/11/24 1100)   lactated ringers     lactated ringers 20 mL/hr at 01/11/24 1100   micafungin (MYCAMINE) 150 mg in sodium chloride  0.9 % 100 mL IVPB     milrinone 0.25 mcg/kg/min (01/11/24 1100)   niCARDipine 5 mg/hr (01/11/24 1207)   nitroGLYCERIN 80 mcg/min (01/11/24 1100)   norepinephrine (LEVOPHED) Adult infusion Stopped (01/11/24 0654)   potassium chloride 10 mEq (01/11/24 1247)    PRN Medications: sodium chloride , albumin human, clonazePAM , dextrose, fentaNYL  (SUBLIMAZE ) injection, metoprolol tartrate, midazolam  PF, ondansetron  (ZOFRAN ) IV, oxyCODONE, sodium chloride  flush, traMADol    Patient Profile   Fe Okubo is a 68 y.o. female with hx of recent nephrolithiasis s/p ureteral stent and lithotripsy at [facility] 10/10 with stent removal.  Admitted with persistent candidemia.   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 Kerrin - [facility] 10/31/5 with no CAD and normal L/R heart pressures.  - Intra-op TEE LVEF 60-65%, RV mildly reduced, small immobile mass on anterior valve leaflet, mod MR, trivial TR - s/p dental extraction by Dr. Sheryle 11/3 - Continue milrinone and Epi  -  Continue nitroglycerin gtt per CTS - ID following. Continue Micafungin  Junctional HR - now paced at 90  Hx of nephrolithiasis  - s/p ureteral stent and lithotripsy   CRITICAL CARE Performed by: Beckey LITTIE Coe   Total critical care time: 16 minutes  Critical care time was exclusive of separately billable procedures and treating other patients.  Critical care was necessary to treat or prevent imminent or life-threatening deterioration.  Critical care was time spent personally by me on the following activities: development of treatment plan with patient and/or surrogate as well as nursing, discussions with consultants, evaluation of patient's response to treatment, examination of patient, obtaining history from patient or surrogate, ordering and performing treatments and interventions, ordering and review of laboratory studies, ordering and review of radiographic studies, pulse oximetry and re-evaluation of patient's condition.   Length of Stay: 9  Beckey LITTIE Coe, NP  01/11/2024, 1:17 PM  Advanced Heart Failure Team Pager [phone] (M-F; 7a - 5p)  Please contact [facility] for night-coverage after hours (5p -7a ) and weekends on [email or web address]

## 2024-01-11 NOTE — Discharge Instructions (Addendum)
Discharge Instructions:  1. You may shower, please wash incisions daily with soap and water and keep dry.  If you wish to cover wounds with dressing you may do so but please keep clean and change daily.  No tub baths or swimming until incisions have completely healed.  If your incisions become red or develop any drainage please call our office at [phone]  2. No Driving until cleared by Dr. Hendrickson's office and you are no longer using narcotic pain medications  3. Monitor your weight daily.. Please use the same scale and weigh at same time... If you gain 5-10 lbs in 48 hours with associated lower extremity swelling, please contact our office at [phone]  4. Fever of 101.5 for at least 24 hours with no source, please contact our office at [phone]  5. Activity- up as tolerated, please walk at least 3 times per day.  Avoid strenuous activity, no lifting, pushing, or pulling with your arms over 8-10 lbs for a minimum of 6 weeks  6. If any questions or concerns arise, please do not hesitate to contact our office at [phone]   Information on my medicine - ELIQUIS (apixaban)  Why was Eliquis prescribed for you? Eliquis was prescribed for you to reduce the risk of a blood clot forming that can cause a stroke if you have a medical condition called atrial fibrillation (a type of irregular heartbeat).  What do You need to know about Eliquis ? Take your Eliquis TWICE DAILY - one tablet in the morning and one tablet in the evening with or without food. If you have difficulty swallowing the tablet whole please discuss with your pharmacist how to take the medication safely.  Take Eliquis exactly as prescribed by your doctor and DO NOT stop taking Eliquis without talking to the doctor who prescribed the medication.  Stopping may increase your risk of developing a stroke.  Refill your prescription before you run out.  After discharge, you should have regular check-up appointments  with your healthcare provider that is prescribing your Eliquis.  In the future your dose may need to be changed if your kidney function or weight changes by a significant amount or as you get older.  What do you do if you miss a dose? If you miss a dose, take it as soon as you remember on the same day and resume taking twice daily.  Do not take more than one dose of ELIQUIS at the same time to make up a missed dose.  Important Safety Information A possible side effect of Eliquis is bleeding. You should call your healthcare provider right away if you experience any of the following: Bleeding from an injury or your nose that does not stop. Unusual colored urine (red or dark brown) or unusual colored stools (red or black). Unusual bruising for unknown reasons. A serious fall or if you hit your head (even if there is no bleeding).  Some medicines may interact with Eliquis and might increase your risk of bleeding or clotting while on Eliquis. To help avoid this, consult your healthcare provider or pharmacist prior to using any new prescription or non-prescription medications, including herbals, vitamins, non-steroidal anti-inflammatory drugs (NSAIDs) and supplements.  This website has more information on Eliquis (apixaban): http://www.eliquis.com/eliquis/home   

## 2024-01-11 NOTE — Op Note (Signed)
 NAME: Tracy Baker MEDICAL RECORD NO: [id number] ACCOUNT NO: [id number] DATE OF BIRTH: [birth year] FACILITY: [facility] LOCATION: [facility] PHYSICIAN: Elspeth BROCKS. 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  PROCEDURE: Median sternotomy, extracorporeal circulation, complex mitral valve repair with triangular resection of anterior leaflet vegetation, placement of Gore-Tex pseudo chords, and edge-to-edge repair.  SURGEON: Elspeth BROCKS. Kerrin, MD  ASSISTANT: Con Bend, PA. Experienced assistance was necessary for this case due to the surgical complexity. Con Bend assisted with exposure, retraction of delicate tissues, suture management, and decannulation from bypass.  ANESTHESIA: General.  FINDINGS: Transesophageal echocardiography showed a nodular area on the anterior leaflet of the mitral valve with preserved systolic function. The valve itself had an area of calcification in the A2 leaflet involving cords with friable tissue overlying  the nodular area. Moderate mitral regurgitation post repair.  CLINICAL NOTE: Tracy Baker is a 68 year old woman who was previously healthy, who had undergone lithotripsy and placement of a ureteral stent that was complicated by UTI and candida endocarditis. She was transferred to [facility]. Transesophageal  echocardiography revealed an area on the anterior leaflet consistent with a vegetation. She was advised to undergo surgical resection of the vegetation. The indications, risks, benefits, and alternatives were discussed in detail with the patient. She  understood and accepted the risks and agreed to proceed.  OPERATIVE NOTE: Tracy Baker was brought to the operating room on 01/10/2024. She had induction of general anesthesia and was intubated. Intravenous antibiotics were administered. A  Foley catheter was placed. Transesophageal echocardiography was performed by Dr. Lamar Needle. Please see the separately dictated note for full details of the procedure. Findings were similar to the preoperative TEE.  A timeout was performed. A median sternotomy was performed. Initial hemostasis was obtained. The sternal retractor was placed and was gradually opened over time. The patient was fully heparinized. After confirming adequate anticoagulation with ACT  measurement, the aorta was cannulated via concentric 2-0 Ethibond pledgeted pursestring sutures. A 28-French malleable cannula was placed via pursestring suture in the superior vena cava. Cardiopulmonary bypass was commenced. A 36-French malleable  cannula was placed in the inferior vena cava via a pursestring suture in the inferior aspect of the right atrium. Caval tapes were placed, but were only tightened during the open portion of the procedure. A retrograde cardioplegia cannula was placed via  a pursestring suture in the right atrium and directed into the coronary sinus. An antegrade cardioplegia cannula was placed in the ascending aorta via a pledgeted 4-0 Prolene suture. The patient was cooled to 32 degrees Celsius.  The aorta was cross-clamped. The left ventricle was emptied via the aortic root vent. Cardiac arrest then was achieved with a combination of cold antegrade and retrograde KBC blood cardioplegia, and topical ice. 1 liter of cardioplegia was administered.  An initial 500 mL was given antegrade and then 500 mL was given retrograde. A transseptal approach was used. A right atriotomy was performed. The left atrium was entered via the fossa ovalis. Even with that, the left atrial size was very small and  exposure of the valve was challenging, but we were able to achieve adequate exposure. Inspection of the valve revealed no evidence of annular dilatation. There was normal anatomy of the posterior leaflet with no signs of  vegetation. On the anterior leaflet in the central portion of A2, there was a nodular area with shaggy appearance over the  surface consistent with the nodule seen on echocardiogram. A triangular resection was performed of this area. This did include a cord that attached in that area, which was resected along with the nodule. The defect in the anterior leaflet then was closed with 4-0 Ethibond sutures. A Gore-Tex Neochord then was fashioned. A 4-0 Gore-Tex suture was placed through the papillary muscle and then brought through the edge of the mitral leaflet. The valve was distended and a clip was placed on the Gore-Tex suture and then it was tied over top of the clip. A second Gore-Tex suture was placed through the posterolateral papillary muscle and likewise placed through the tip of the leaflet. Filling the heart with iced saline revealed only minimal residual leakage. The left atriotomy then was closed. The interatrial septum then was closed with two layers of running 4-0 Prolene suture. De-airing was performed before tying the first layer, which was a horizontal mattress suture. The second layer was a simple suture. The right atrial side of the septum likewise was closed. The right atriotomy then was closed with running 4-0 Prolene suture. Again, a two-layer closure was used. The patient was placed in Trendelenburg position. A reanimation dose of cardioplegia was administered. It should be noted that additional cardioplegia was administered at 30 minutes after the initial dose. The second dose of cardioplegia was given retrograde. By the time the reanimation dose was given, the retrograde cannula had become dislodged and so that was given antegrade. The patient was placed in Trendelenburg position and the aortic cross-clamp was removed. The total cross-clamp time was 117 minutes.   While rewarming was completed, the atriotomies were inspected for hemostasis. Epicardial pacing wires were placed on the right  ventricle and right atrium. When the patient had rewarmed to a core temperature of 37 degrees Celsius, the retrograde cardioplegia cannula was removed. The ascending cardioplegia cannula was left in place as a vent and the patient underwent a trial wean from bypass. The initial bypass time was 150 minutes. Significant air was noted on the TEE. De-airing was performed with the aortic root vent and by placing a needle through the apex of the left ventricle. Inspection of the valve showed significant moderate to severe mitral regurgitation with a failure of coaptation. Decision was made to readdress the mitral valve. The patient was placed back on cardiopulmonary bypass. No protamine  had been given. The aorta was again cross-clamped. The full dose of KBC blood cardioplegia was given via the antegrade catheter at this point. The interatrial groove was dissected out and the left atriotomy was performed. The retractor was placed. The valve was inspected. It had been noted on echo and it was clear on re-examination of the valve that one of the Gore-Tex sutures was tethering the anterior leaflet down. Both Gore-Tex Neochords were removed and a new Neochord was placed. With this, there still was residual regurgitation. Additional attempts were made to achieve good coaptation, but ultimately the decision was made to do an edge-to-edge suture. This was done with two 4-0 Prolene figure-of-eight sutures. Afterwards, there was minimal residual mitral regurgitation. The left atriotomy then was closed in 2 layers. Again, a running 4-0 horizontal mattress suture followed by de-airing and then finally a running 4-0 Prolene simple suture. The atrial pacemaker wires were placed back onto the right atrium. The ventricular wires were still in place. When the patient had again rewarmed to 37 degrees Celsius, she again underwent a wean from bypass. The lungs were inflated. The heart was gradually filled.  After weaning from bypass, there  was significant improvement in the mitral regurgitation, although there still was a very narrow central jet with moderate mitral regurgitation. Pulmonary artery pressures were normal. Ventricular systolic function was preserved on both the right and left ventricle. The inferior vena caval cannula was removed. There was good hemostasis at that site. The superior vena caval cannula then was removed. A test dose of protamine  was administered prior to removing the superior vena cava cannula. The remainder of the protamine  was administered without incident. The aortic root vent was removed. Of note, there was minimal to no air on the TEE after the second bypass run. The atrial cannula was removed. The remainder of the protamine  was administered without incident. The chest was irrigated with warm saline. Hemostasis was achieved. A 28-French Blake drain was placed along the diaphragmatic surface of the pericardium and a 36-French chest tube was placed into the anterior mediastinum. The sternum was closed with a combination of single and double heavy-gauge stainless steel wires. Pectoralis fascia and subcutaneous tissue and skin were closed in standard fashion. All sponge, needle, and instrument counts were correct at the end of the procedure. The patient was taken from the operating room to the surgical intensive care unit, intubated and in critical condition.    SUJ D: 01/11/2024 3:16:51 pm T: 01/11/2024 11:36:00 pm  JOB: [id number]/ [id number]

## 2024-01-11 NOTE — TOC Initial Note (Addendum)
 Transition of Care [facility]) - Initial/Assessment Note    Patient Details  Name: Tracy Baker MRN: [id number] Date of Birth: Jan 31, 1956  Transition of Care [facility]) CM/SW Contact:    Justina Delcia Czar, RN Phone Number: [phone] 01/11/2024, 12:34 PM  Clinical Narrative:                 Spoke to pt at bedside. Gave permission to speak to dtrs. Contacted dtr, Brandi and states pt lives alone. Family wants SNF rehab because pt does live alone.  Gave permission to create FL2 and fax referral to SNF.   Will need PT/OT evaluation and recommendations.  Adorations following per surgical protocol for Home Health.  Expected Discharge Plan: IP Rehab Facility Barriers to Discharge: Continued Medical Work up   Patient Goals and CMS Choice Patient states their goals for this hospitalization and ongoing recovery are:: wants to get better CMS Medicare.gov Compare Post Acute Care list provided to:: Patient Represenative (must comment) Choice offered to / list presented to : Adult Children      Expected Discharge Plan and Services   Discharge Planning Services: CM Consult Post Acute Care Choice: Skilled Nursing Facility Living arrangements for the past 2 months: Apartment                                      Prior Living Arrangements/Services Living arrangements for the past 2 months: Apartment Lives with:: Self Patient language and need for interpreter reviewed:: Yes Do you feel safe going back to the place where you live?: Yes      Need for Family Participation in Patient Care: Yes (Comment) Care giver support system in place?: Yes (comment)   Criminal Activity/Legal Involvement Pertinent to Current Situation/Hospitalization: No - Comment as needed  Activities of Daily Living   ADL Screening (condition at time of admission) Independently performs ADLs?: Yes (appropriate for developmental age) Is the patient deaf or have difficulty hearing?: No Does the patient have  difficulty seeing, even when wearing glasses/contacts?: No Does the patient have difficulty concentrating, remembering, or making decisions?: No  Permission Sought/Granted Permission sought to share information with : Case Manager, Family Supports, PCP Permission granted to share information with : Yes, Verbal Permission Granted  Share Information with NAME: Daphne Skates  Permission granted to share info w AGENCY: SNF, Home Health, PCP, DME  Permission granted to share info w Relationship: daughter  Permission granted to share info w Contact Information: [phone]  Emotional Assessment Appearance:: Appears stated age Attitude/Demeanor/Rapport: Engaged Affect (typically observed): Accepting Orientation: : Oriented to Self, Oriented to Place, Oriented to  Time, Oriented to Situation   Psych Involvement: No (comment)  Admission diagnosis:  Candidemia (HCC) [B37.7] S/P MVR (mitral valve repair) [Z98.890] Patient Active Problem List   Diagnosis Date Noted   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 albicans candidemia (HCC) 12/29/2023   Hypokalemia 12/29/2023   Hypocalcemia 12/29/2023   Transaminitis 12/29/2023   Colon cancer screening    Polyp of transverse colon    PCP:  Kristina Tinnie POUR, PA-C Pharmacy:   [id number] [place], [place] - [address] AT [facility] OF [address] [facility] [address] [place] [address] Phone: [phone] Fax: [phone]     Social Drivers of Health (  SDOH) Social History: SDOH Screenings   Food Insecurity: No Food Insecurity (12/29/2023)  Housing: Low Risk  (12/29/2023)  Transportation Needs: No Transportation Needs (12/29/2023)  Utilities: Not At Risk (12/29/2023)  Alcohol Screen: Low Risk  (06/08/2022)  Depression (PHQ2-9): Low Risk  (11/08/2023)  Social Connections: Socially Isolated  (12/29/2023)  Tobacco Use: High Risk (01/10/2024)   SDOH Interventions: Social Connections Interventions: Patient Declined   Readmission Risk Interventions     No data to display

## 2024-01-11 NOTE — Progress Notes (Signed)
 [facility]    Date of Admission:  12/28/2023   Total days of antibiotics 8        Day 8 micafungin        Day 2 vanco           ID: Tracy Baker is a 68 y.o. female with   Principal Problem:   C. albicans candidemia (HCC) Active Problems:   Hypokalemia   Hypocalcemia   Transaminitis   Kidney stone   Endocarditis of mitral valve   Fungal endocarditis   S/P MVR (mitral valve repair)    Subjective: Awakens and responds to questions  Medications:   sodium chloride    Intravenous Once   acetaminophen  1,000 mg Oral Q6H   Or   acetaminophen (TYLENOL) oral liquid 160 mg/5 mL  1,000 mg Per Tube Q6H   aspirin EC  325 mg Oral Daily   Or   aspirin  324 mg Per Tube Daily   bisacodyl  10 mg Oral Daily   Or   bisacodyl  10 mg Rectal Daily   Chlorhexidine Gluconate Cloth  6 each Topical Daily   docusate sodium  200 mg Oral Daily   enoxaparin (LOVENOX) injection  40 mg Subcutaneous QHS   metoCLOPramide (REGLAN) injection  10 mg Intravenous Q6H   metoprolol tartrate  12.5 mg Oral BID   Or   metoprolol tartrate  12.5 mg Per Tube BID   [START ON 01/12/2024] pantoprazole   40 mg Oral Daily   pantoprazole  (PROTONIX ) IV  40 mg Intravenous QHS   pravastatin  20 mg Oral q1800   sodium chloride  flush  3 mL Intravenous Q12H    Objective: Vital signs in last 24 hours: Temp:  [95 F (35 C)-99.1 F (37.3 C)] 98.6 F (37 C) (11/07 1145) Pulse Rate:  [68-93] 89 (11/07 1145) Resp:  [11-37] 22 (11/07 1145) BP: (83-148)/(55-86) 141/78 (11/07 1100) SpO2:  [90 %-100 %] 90 % (11/07 1145) Arterial Line BP: (62-244)/(37-232) 157/71 (11/07 1145) FiO2 (%):  [40 %-50 %] 40 % (11/07 0800) Weight:  [79.5 kg] 79.5 kg (11/07 0355)   General appearance: fatigued Resp: clear to auscultation bilaterally Cardio: regular rate and rhythm GI: normal findings: bowel sounds normal and soft, non-tender Extremities: edema none  Lab Results Recent Labs    01/10/24 2144  01/10/24 2323 01/11/24 0417 01/11/24 0423 01/11/24 0757 01/11/24 0856  WBC 11.8*  --  11.3*  --   --   --   HGB 12.4   < > 12.5   < > 10.9* 10.9*  HCT 36.0   < > 35.6*   < > 32.0* 32.0*  NA 148*   < > 146*   < > 151* 150*  K 3.1*   < > 3.3*   < > 3.2* 3.1*  CL 115*  --  116*  --   --   --   CO2 21*  --  18*  --   --   --   BUN 13  --  15  --   --   --   CREATININE 0.88  --  1.04*  --   --   --    < > = values in this interval not displayed.   Liver Panel No results for input(s): PROT, ALBUMIN, AST, ALT, ALKPHOS, BILITOT, BILIDIR, IBILI in the last 72 hours. Sedimentation Rate No results for input(s): ESRSEDRATE in the last 72 hours. C-Reactive Protein No results for input(s): CRP  in the last 72 hours.  Microbiology:  Studies/Results: DG Chest Port 1 View Result Date: 01/11/2024 EXAM: 1 VIEW(S) XRAY OF THE CHEST 01/11/2024 05:52:00 AM COMPARISON: 01/10/2024 CLINICAL HISTORY: S/P MVR (mitral valve repair) FINDINGS: LINES, TUBES AND DEVICES: Endotracheal tube in place with tip 3.3 cm above the carina. Gastric tube in place, coursing below the diaphragm with tip outside field of view. Right IJ Swan-Ganz catheter in place with tip projecting over main pulmonary artery. LUNGS AND PLEURA: Small bilateral pleural effusions with associated bibasilar opacities. Mild pulmonary edema. No pneumothorax. HEART AND MEDIASTINUM: No acute abnormality of the cardiac and mediastinal silhouettes. BONES AND SOFT TISSUES: Nondisplaced median sternotomy wires. IMPRESSION: 1. Small bilateral pleural effusions with associated bibasilar opacities, likely atelectasis versus aspiration or infection. 2. Mild pulmonary edema. Electronically signed by: Katheleen Faes MD 01/11/2024 09:51 AM EST RP Workstation: [id number]   ECHO INTRAOPERATIVE TEE Result Date: 01/10/2024  *INTRAOPERATIVE TRANSESOPHAGEAL REPORT *  Patient Name:   [name] Date of Exam: 01/10/2024 Medical Rec #:  [id number]       Height:       64.0 in Accession #:    [id number]     Weight:       146.6 lb Date of Birth:  12-04-1955      BSA:          1.71 m Patient Age:    68 years       BP:           121/68 mmHg Patient Gender: F              HR:           59 bpm. Exam Location:  Inpatient Transesophogeal exam was perform intraoperatively during surgical procedure. Patient was closely monitored under general anesthesia during the entirety of examination. Indications:     Mitral Valve Repair Performing Phys: [id number] Diagnosing Phys: Lamar Needle MD Complications: No known complications during this procedure. POST-OP IMPRESSIONS _ Left Ventricle: The left ventricle is unchanged from pre-bypass. _ Right Ventricle: mildly reduced function. _ Aorta: The aorta appears unchanged from pre-bypass. _ Left Atrium: The left atrium appears unchanged from pre-bypass. _ Left Atrial Appendage: The left atrial appendage appears unchanged from pre-bypass. _ Aortic Valve: The aortic valve appears unchanged from pre-bypass. _ Mitral Valve: S/p Mitral valve repair. Now with moderate MR by ERO (0.35cm^2), VC (0.41cm), Eccentric jet. Post op gradient of . _ Tricuspid Valve: The tricuspid valve appears unchanged from pre-bypass. _ Pulmonic Valve: The pulmonic valve appears unchanged from pre-bypass. _ Interatrial Septum: The interatrial septum appears unchanged from pre-bypass. _ Interventricular Septum: The interventricular septum appears unchanged from pre-bypass. _ Pericardium: The pericardium appears unchanged from pre-bypass. PRE-OP FINDINGS  Left Ventricle: The left ventricle has normal systolic function, with an ejection fraction of 60-65%. The cavity size was normal. There is no left ventricular hypertrophy. Right Ventricle: The right ventricle has normal systolic function. The cavity was normal. There is no increase in right ventricular wall thickness. Left Atrium: Left atrial size was normal in size. No left atrial/left atrial  appendage thrombus was detected. Right Atrium: Right atrial size was normal in size. Interatrial Septum: No atrial level shunt detected by color flow Doppler. Pericardium: There is no evidence of pericardial effusion. Mitral Valve: Small immobile mass on anterior mitral valve leaflet (A2). Suspicious for vegetation.. Mitral valve regurgitation is trivial by color flow Doppler. There is No evidence of mitral stenosis. Tricuspid Valve: The tricuspid valve was  normal in structure. Tricuspid valve regurgitation is trivial by color flow Doppler. No evidence of tricuspid stenosis is present. Aortic Valve: The aortic valve is tricuspid Aortic valve regurgitation was not visualized by color flow Doppler. There is no stenosis of the aortic valve. Pulmonic Valve: The pulmonic valve was normal in structure, with normal. Pulmonic valve regurgitation is trivial by color flow Doppler. Aorta: The aortic root, ascending aorta and aortic arch are normal in size and structure.  Lamar Needle MD Electronically signed by Lamar Needle MD Signature Date/Time: 01/10/2024/5:06:30 PM    Final    DG Chest Port 1 View Result Date: 01/10/2024 EXAM: 1 VIEW(S) XRAY OF THE CHEST 01/10/2024 04:08:00 PM COMPARISON: 12/28/2023 CLINICAL HISTORY: S/P MVR (mitral valve repair) FINDINGS: LINES, TUBES AND DEVICES: Endotracheal tube in place with tip 2.2 cm above carina. Enteric tube in place with tip extending below the diaphragm and terminating below field of view. Right internal jugular Swan-Ganz catheter in place with tip overlying expected area of main pulmonary artery. Left basilar chest tube in place. Mediastinal drain in place. LUNGS AND PLEURA: Low lung volumes. Mild left basilar atelectasis. No pulmonary edema. No pleural effusion. No pneumothorax. HEART AND MEDIASTINUM: Post-CABG changes. No acute abnormality of the cardiac and mediastinal silhouettes. BONES AND SOFT TISSUES: Intact sternotomy wires. No acute osseous abnormality.  IMPRESSION: 1. Low lung volumes with mild left basilar atelectasis. 2. Postoperative support devices and lines in expected positions. 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  Will continue her micafungin Her LFTs have not been drawn in some time. Will recheck.    [facility] Pager: [phone]  01/11/2024, 1:19 PM

## 2024-01-11 NOTE — Progress Notes (Addendum)
 1 Day Post-Op Procedure(s) (LRB): REPAIR, MITRAL VALVE (N/A) ECHOCARDIOGRAM, TRANSESOPHAGEAL, INTRAOPERATIVE (N/A) Subjective: Intubated, alert and following commands  Objective: Vital signs in last 24 hours: Temp:  [95 F (35 C)-99.1 F (37.3 C)] 99.1 F (37.3 C) (11/07 0655) Pulse Rate:  [59-93] 89 (11/07 0655) Cardiac Rhythm: A-V Sequential paced (11/06 2017) Resp:  [10-27] 22 (11/07 0655) BP: (83-148)/(55-86) 109/68 (11/07 0500) SpO2:  [93 %-100 %] 97 % (11/07 0655) Arterial Line BP: (62-244)/(37-232) 130/56 (11/07 0655) FiO2 (%):  [40 %-50 %] 40 % (11/07 0433) Weight:  [79.5 kg] 79.5 kg (11/07 0355)  Hemodynamic parameters for last 24 hours: PAP: (20-49)/(2-29) 25/15 CVP:  [0 mmHg-16 mmHg] 9 mmHg PCWP:  [15 mmHg-23 mmHg] 15 mmHg CO:  [2.3 L/min-3.2 L/min] 2.3 L/min CI:  [1.34 L/min/m2-1.84 L/min/m2] 1.34 L/min/m2  Intake/Output from previous day: 11/06 0701 - 11/07 0700 In: 9662.9 [I.V.:4644.6; Blood:1218; NG/GT:240; IV Piggyback:3560.2] Out: 6145 [Urine:4145; Blood:1825; Chest Tube:175] Intake/Output this shift: No intake/output data recorded.  General appearance: alert and cooperative Neurologic: no focal deficit Heart: regular rate and rhythm and + rub Lungs: clear to auscultation bilaterally Abdomen: normal findings: soft, non-tender  Lab Results: Recent Labs    01/10/24 2144 01/10/24 2323 01/11/24 0417 01/11/24 0423  WBC 11.8*  --  11.3*  --   HGB 12.4   < > 12.5 11.6*  HCT 36.0   < > 35.6* 34.0*  PLT 149*  --  144*  --    < > = values in this interval not displayed.   BMET:  Recent Labs    01/10/24 2144 01/10/24 2323 01/11/24 0417 01/11/24 0423  NA 148*   < > 146* 151*  K 3.1*   < > 3.3* 3.1*  CL 115*  --  116*  --   CO2 21*  --  18*  --   GLUCOSE 291*  --  207*  --   BUN 13  --  15  --   CREATININE 0.88  --  1.04*  --   CALCIUM 7.7*  --  7.8*  --    < > = values in this interval not displayed.    PT/INR:  Recent Labs     01/10/24 1555  LABPROT 20.4*  INR 1.7*   ABG    Component Value Date/Time   PHART 7.308 (L) 01/11/2024 0423   HCO3 18.5 (L) 01/11/2024 0423   TCO2 20 (L) 01/11/2024 0423   ACIDBASEDEF 7.0 (H) 01/11/2024 0423   O2SAT 60.6 01/11/2024 0519   CBG (last 3)  Recent Labs    01/11/24 0421 01/11/24 0516 01/11/24 0618  GLUCAP 197* 176* 179*    Assessment/Plan: S/P Procedure(s) (LRB): REPAIR, MITRAL VALVE (N/A) ECHOCARDIOGRAM, TRANSESOPHAGEAL, INTRAOPERATIVE (N/A) POD # 1 NEURO- intact CV- in junctional rhythm, paced at 90  PA pressures normal  Ci persistently low but co-ox 61  ASA, hold beta blocker for now ID- on micafungin for Candida RESP- still on vent  Will extubate this AM RENAL- creatinine normal  Hypokalemia- suppelented  Recheck K after runs ENDO- CBG elevated on high doses of insulin  Continue drip for now GI- advance diet once extubated Anemia- mild post transfusion DC Chest tubes SCD + enoxaparin  LOS: 9 days    Tracy Baker 01/11/2024

## 2024-01-11 NOTE — Discharge Summary (Addendum)
 "      [address] Tracy Baker             [phone]        Physician Discharge Summary  Patient ID: Tracy Baker MRN: [id number] DOB/AGE: 07-10-1955 68 y.o.  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 albicans candidemia (HCC) 12/29/2023   Hypokalemia 12/29/2023   Hypocalcemia 12/29/2023   Transaminitis 12/29/2023   Colon cancer screening    Polyp of transverse colon     Discharged Condition: fair  History of Present Illness:  [name] is a 68 year old female with no significant past medical history prior to events this month.  She underwent lithotripsy and placement of a right ureteral stent on 12/14/2023 at [facility].  She subsequently had the stent removed 4 days later.  Shortly after that, she developed fever, chills, and malaise but went on to awaiting a fit in [place].  While there, her symptoms worsened prompting visit to the emergency room at [facility]  on 12/22/2023.  A CT  scan obtained at that facility demonstrated tissue stranding around the right kidney and right ureter.  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  She was started on Diflucan  and treated there for several days.  She decided she wanted to complete treatment in [facility] so left the [facility] AGAINST MEDICAL ADVICE on 12/29/2023 and drove herself to [place].  She was seen in the emergency room here at [facility] and admitted by the hospitalist service. IV Diflucan  was resumed.  Repeat blood and urine cultures obtained here are negative to date.  She may have by the infectious disease service and switched from fluconazole  to micafungin .  An ophthalmology consult was provided by Dr. Zerita Salinas who conducted a funduscopic exam with dilation showing no evidence of fungal endophthalmitis. Transesophageal echo obtained 11/06 showing normal biventricular function. The report states there was a very small, mobile echodensity on the anterior leaflet of the mitral valve consistent with vegetation  with trivial MR.  There are no dimensions of the lesion given in the report but it appears to be about 4 mm in diameter.    [name] is currently resting in bed with her daughter at the bedside. She is awake and alert and denies having any discomfort although she admitted to feeling anxious.  She continues to work a couple days a week.  She thinks her last dental visit was over a year ago and said she did not have any current dental concerns.  Dr.  Hendrickson reviewed the patient's diagnostic studies and determined she would benefit from surgical intervention. He reviewed the treatment options as well as the risks and benefits of surgery with the patient. [name] was agreeable to proceed with surgery.  Hospital Course: The patient remained stable while inpatient at [facility]. She underwent dental consult and extraction was recommended. Dental extraction took place on 11/03 by Dr.  Sheryle without complication.   She was then brought to the operating room and underwent mitral valve vegetation removal and mitral valve repair on 11/07. She tolerated the procedure well and was transferred to the SICU in stable condition. She was extubated early POD1 without complication. 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 She had a junctional rhythm and remained DDD paced at 80. She had expected acute postop blood loss anemia and was transfused. Drips were weaned as hemodynamics tolerated. She developed pulmonary edema and was diuresed. She developed thrombocytopenia HITT ab was negative. She developed elevated LFTs likely due to hepatic congestion, this improved with time. She was started on Cefepime  and Linezolid  on 11/12 for possible aspiration PNA/HAP.  She required milrinone  for hemodynamic support. Milrinone  was weaned as hemodynamics tolerated. Advanced heart failure determined diuretic regimen. PT/OT evaluations recommended CIR, CIR began following. She developed atrial fibrillation with RVR and was started on IV Amiodarone . Thrombocytopenia improved and she was started on Lovenox . SHe remained in atrial fibrillation on 11/15 and was started on Apixaban . She developed recurrent epistaxis. Aspirin  was discontinued, Afrin was ordered and ENT was consulted. They recommended Afrin for active bleeding and applied surgiflow to the left nare. She was brought for DCCV but this was not done due to severe mitral regurgitation on echocardiogram. She would require mitral valve replacement surgery, Dr. Kerrin was hopeful that she could go to CIR and go home and return in 3 weeks for redo surgery. She was in rate controlled atrial fibrillation and IV Amiodarone  was transitioned to PO Amiodarone . Her thrombocytopenia again worsened without clear source but improved with time. She remained in rate controlled atrial fibrillation on PO Amiodarone . She had poor oral intake, oral diet was  encouraged and she was started on boost supplement shakes. She continued to complain of dry mouth, magic mouthwash was started, scopolamine  patch was discontinued and Torsemide  was held for 2 days. She was felt stable for transfer to 2C progressive unit on 11/24. Dry mouth and appetite improved after being started on magic mouthwash and biotene. Her ambulation was progressing well and she was saturating well on room air. Her bowels were moving appropriately. Her incisions are healing well without sign of infection. Unfortunately the patient's insurance denied placement in CIR. She developed epistaxis again, Eliquis  was held and ENT packed both nares with surgicel packing. She was started on Ancef  for empiric toxic shock prophylaxis She underwent cardiac catheterization on 12/02 which showed normal filling pressures, significant mitral regurgitation, and moderately reduced output. She was restarted on Milrinone  0.125, nausea/vomiting and AKI improved. It was felt she would require redo surgery while in the hospitalized since she was well optimized and requiring milrinone . Dr. Kerrin reviewed the surgery as well and treatment options and the risks and benefits of surgery extensively with the patient and family. The patient was agreeable to proceed with surgery.  The patient was brought to the operating room on 12/05 and underwent redo sternotomy and mitral valve replacement utilizing a 27mm Mitris Resilia Mitral Valve.  She was transfused with 4 units of packed red blood cells, 2 units  of platelets, 2 fresh frozen plasma, and 1 unit of cryoprecipitate perioperatively due to significant coagulopathy.  Following the procedure, she was transferred to the surgical ICU on milrinone , epinephrine , and norepinephrine .  Overnight after surgery, she required escalation of the inotropic and vasopressor support.  She was evaluated by the advanced heart failure team on postop day 1.  She was felt to have worsening heart  failure consistent with predominant RV dysfunction.  Nitric oxide  was added.  Later on postop day 1, decision was made to cannulate for V-PA ECMO. vasopressor support to be weaned following initiation of ECMO. She became oliguric, continuous Lasix  infusion was initiated at 20 mg/h.  On postop day 2, her INR was elevated 5.9.  She was transfused with additional fresh frozen plasma.  DIC panel was negative.  She was noted to have asymmetrical opacity on the chest x-ray.  Critical care team evaluated this with bronchoscopy.  She continued to require full ventilator support.  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 ECMO was weaned and decannulated on 12/08. She developed probable RUL pneumonia and was started on vancomycin  and meropenem . She developed likely consumptive thrombocytopenia which was monitored closely. She developed a junctional rhythm and remained paced DDD at 80. Sedation was weaned and she began following commands. Ventilator was weaned per PCCM and she was extubated on 12/15. iNO was weaned off on 02/13/24. She developed thrombocytopenia which was closely monitored. Pleural tubes were removed on 12/10 without complication. She developed afib with controlled rate, pacer was transitioned to VVI 50. She developed an ileus but was tolerated tube feeds, reglan  and miralax  were started. Low dose anticoagulation was started on 12/14 for her prosthetic mitral valve and afib. She was started on CRRT 12/13 for uremia. 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 CRRT was stopped on 12/16 and she was started on HD. She was tolerating tube feeds, she passed her swallow evaluation and was started on PO diet.  She has been given IV Lasix  with Metolazone  with marginal urinary output and dialysis was continued.  She is extremely weak requiring max assist to get out of bed.  PT/OT  continue to work with patient and again recommended inpatient rehab. She was started on a Dobutamine  drip on 12/17. Intermittent HD was done on 12/20 and will be done on 12/22. She had tunneled HD catheter placed on 12/22 by IR. She remained on dobutamine  which was weaned off on 12/29. Epicardial pacing wires were removed without complication on 12/22. PO intake was increased as tolerated and she remained on cycled tube feeds. Foley catheter was removed on 12/23. She was hyperkalemic and given Lokelma . The patient reported she did not want PEG placement, dietician was contacted for retrial of nocturnal feeds and calorie count.  She is showing some improvement in this regard with more of an appetite and less food intolerance. ID recommended IV fluconazole  until discharge and then recommended PO Fluconazole  400mg  daily at discharge with an end date of 03/20/24. They arranged follow up appointment. She was transitioned from heparin  to Eliquis  for stroke prevention with atrial fibrillation. She reached her dry weight with HD so nephrology monitored for renal recovery. Tube feed rate was decreased and she was left on nocturnal tube feeds to hopefully increase PO intake since she did not want PEG tube placed. Her coox remained stable with discontinuation of dobutamine . Her ambulation was progressing  with PT/OT and she was saturating well on room air. Her incisions were healing well without sign of infection and her bowels were moving appropriately. She was felt stable for discharge to CIR and CIR insurance authorization was pending.  The patient's insurance required peer to peer authorization.  This resulted in her stay being approved.  She is stable for discharge to CIR today.  Consults: cardiology, pulmonary/intensive care, ID, nephrology, and wound care  Significant Diagnostic Studies:  DG Chest Port 1 View Result Date: 03/04/2024 CLINICAL DATA:  Status post mitral valve replacement. Pleural effusion. EXAM:  PORTABLE CHEST 1 VIEW COMPARISON:  02/22/2024 FINDINGS: Right upper extremity PICC tip overlies the upper SVC. Left-sided dialysis catheter tip overlies the lower SVC. Weighted enteric tube tip below the diaphragm not included in the field of view. Post median sternotomy with prosthetic cardiac valve. The heart is upper normal in size. Stable mild pulmonary edema. Suspected small pleural effusions. Slight increase in atelectasis in the lung bases. No pneumothorax. Overlying skin staples have been removed. IMPRESSION: 1. Stable mild pulmonary edema. Suspected small pleural effusions. 2. Slight increase in atelectasis in the lung bases. Electronically Signed   By: Andrea Gasman M.D.   On: 03/04/2024 16:00   IR TUNNELED CENTRAL VENOUS CATH [name] W IMG Result Date: 02/25/2024 INDICATION: Renal failure requiring prolonged hemodialysis. EXAM: TUNNELED hemodialysis catheter placement ULTRASOUND AND FLUOROSCOPIC GUIDANCE MEDICATIONS: 2 g Ancef  IV. The antibiotic was given in an appropriate time interval prior to skin puncture. ANESTHESIA/SEDATION: Moderate (conscious) sedation was employed during this procedure. A total of Versed  1.5 mg and Fentanyl  25 mcg was administered intravenously by the radiology nurse. Total intra-service moderate Sedation Time: 17 minutes. The patient's level of consciousness and vital signs were monitored continuously by radiology nursing throughout the procedure under my direct supervision. FLUOROSCOPY: Radiation Exposure Index (as provided by the fluoroscopic device): 10 mGy Kerma COMPLICATIONS: None immediate. PROCEDURE: Informed written consent was obtained from the patient after a discussion of the risks, benefits, and alternatives to treatment. Questions regarding the procedure were encouraged and answered. The left neck and chest were prepped with chlorhexidine  in a sterile fashion, and a sterile drape was applied covering the operative field. Maximum barrier sterile technique with  sterile gowns and gloves were used for the procedure. A timeout was performed prior to the initiation of the procedure. After creating a small venotomy incision, a micropuncture kit was utilized to access the left internal jugular vein under direct, real-time ultrasound guidance after the overlying soft tissues were anesthetized with 1% lidocaine  with epinephrine . Ultrasound image documentation was performed. The microwire was kinked to measure appropriate catheter length. The micropuncture sheath was exchanged for a peel-away sheath over a guidewire. A 28 cm tunneled hemodialysis palindrome catheter was tunneled in a retrograde fashion from the anterior chest wall to the venotomy incision. The catheter was then placed through the peel-away sheath with tip ultimately positioned at the superior caval-atrial junction. Final catheter positioning was confirmed and documented with a spot radiographic image. The catheter aspirates and flushes normally. The catheter was flushed with appropriate volume heparin  dwells. The catheter exit site was secured with a 0-Prolene retention suture. The venotomy incision was closed withDermabond and Steri-strips. Dressings were applied. The patient tolerated the procedure well without immediate post procedural complication. FINDINGS: After catheter placement, the tip lies within the superior cavoatrial junction. The catheter aspirates and flushes normally and is ready for immediate use. IMPRESSION: Successful placement of 28 cm tunneled hemodialysis palindrome catheter via the  left internal jugular vein with tip terminating at the superior caval atrial junction. The catheter is ready for immediate use. Electronically Signed   By: Cordella Banner   On: 02/25/2024 14:58   DG Chest Port 1 View Result Date: 02/22/2024 CLINICAL DATA:  Status post mitral valve replacement. EXAM: PORTABLE CHEST 1 VIEW COMPARISON:  02/20/2024 FINDINGS: Feeding tube extending into the stomach with its tip  not included. Stable median sternotomy wires, skin clips and prosthetic mitral valve. Right PICC and left jugular catheter tips in the upper right atrium, proximally 2 cm inferior to the superior cavoatrial junction. Stable mildly enlarged cardiac silhouette with decreased prominence of the pulmonary vasculature and interstitial markings and decreased size of a small right pleural effusion. Probable small left pleural effusion. Decreased airspace opacities at both lung bases with minimal bibasilar linear atelectasis. No acute bony abnormality. IMPRESSION: 1. Decreased changes of congestive heart failure with minimal residual interstitial and bibasilar alveolar edema. 2. Decreased small right pleural effusion. 3. Probable small left pleural effusion. Electronically Signed   By: Elspeth Bathe M.D.   On: 02/22/2024 14:01   ECHOCARDIOGRAM LIMITED Result Date: 02/22/2024    ECHOCARDIOGRAM LIMITED REPORT   Patient Name:   Tracy Baker Date of Exam: 02/22/2024 Medical Rec #:  [id number]      Height:       64.0 in Accession #:    [id number]     Weight:       173.7 lb Date of Birth:  1955-04-01      BSA:          1.843 m Patient Age:    68 years       BP:           106/64 mmHg Patient Gender: F              HR:           77 bpm. Exam Location:  Inpatient Procedure: Cardiac Doppler, Color Doppler, Intracardiac Opacification Agent and            Limited Echo (Both Spectral and Color Flow Doppler were utilized            during procedure). Indications:    CHF-acute systolic 150.21  History:        Patient has prior history of Echocardiogram examinations, most                 recent 02/11/2024. Risk Factors:Current Smoker.  Sonographer:    Merlynn Argyle Referring Phys: [phone] [facility]  Sonographer Comments: No subcostal window and Technically difficult study due to poor echo windows. Image acquisition challenging due to respiratory motion. IMPRESSIONS  1. Technically difficult study with limited views  2. Left  ventricular ejection fraction, by estimation, is 60 to 65%. The left ventricle has normal function. Apical septal hypokinesis  3. Right ventricular systolic function was not well visualized. FINDINGS  Left Ventricle: Left ventricular ejection fraction, by estimation, is 60 to 65%. The left ventricle has normal function. Definity  contrast agent was given IV to delineate the left ventricular endocardial borders. Right Ventricle: Right ventricular systolic function was not well visualized. Mitral Valve: There is a bioprosthetic valve present in the mitral position. MV peak gradient, 7.3 mmHg. The mean mitral valve gradient is 3.0 mmHg. MITRAL VALVE MV Peak grad: 7.3 mmHg MV Mean grad: 3.0 mmHg MV Vmax:      1.35 m/s MV Vmean:     86.2 cm/s Lonni Nanas MD Electronically signed  by Lonni Nanas MD Signature Date/Time: 02/22/2024/1:21:31 PM    Final    DG CHEST PORT 1 VIEW Result Date: 02/20/2024 CLINICAL DATA:  Cough. EXAM: PORTABLE CHEST 1 VIEW COMPARISON:  02/19/2024 FINDINGS: Postsurgical changes in the heart. Left jugular dialysis catheter tip at the superior cavoatrial junction. Right arm PICC line tip near the superior cavoatrial junction. Heart size is grossly stable. Increased densities in the right lower lung. Blunting at the right costophrenic angle and cannot exclude right pleural fluid. Left lung is stable without focal disease. Negative for a pneumothorax. Feeding tube extends down the esophagus and the tip is beyond the image. IMPRESSION: 1. Increased densities in right lower lung. Findings are concerning for airspace disease or infection. Possible small right pleural effusion. 2. Support apparatuses as described. Negative for pneumothorax. Electronically Signed   By: Juliene Balder M.D.   On: 02/20/2024 12:14   DG Chest Port 1 View Result Date: 02/19/2024 EXAM: 1 VIEW(S) XRAY OF THE CHEST 02/19/2024 05:34:00 AM COMPARISON: 02/16/2024 CLINICAL HISTORY: S/P MVR (mitral valve replacement)  FINDINGS: LINES, TUBES AND DEVICES: Endotracheal tube removed. Feeding tube in place extending beyond the inferior aspect of the film. Left internal jugular line stable in place with tip at low SVC. Stable right PICC line. LUNGS AND PLEURA: Small right pleural effusion with minimal layering left pleural fluid. Improved right base aeration. Pulmonary interstitial thickening accentuated by portable technique. Persistent bibasilar atelectasis. No pneumothorax. HEART AND MEDIASTINUM: Median sternotomy and prosthetic valve noted. BONES AND SOFT TISSUES: No acute osseous abnormality. IMPRESSION: 1. Small right pleural effusion with minimal layering left pleural fluid. 2. Persistent bibasilar atelectasis with improved right base aeration. 3. Pulmonary interstitial thickening, likely accentuated by portable technique. Electronically signed by: Waddell Calk MD 02/19/2024 07:28 AM EST RP Workstation: [id number]   VAS US  LOWER EXTREMITY VENOUS (DVT) Result Date: 02/18/2024  Lower Venous DVT Study Patient Name:  Tracy Baker  Date of Exam:   02/18/2024 Medical Rec #: [id number]       Accession #:    [id number] Date of Birth: 1955/08/24       Patient Gender: F Patient Age:   38 years Exam Location:  [facility] Procedure:      VAS US  LOWER EXTREMITY VENOUS (DVT) Referring Phys: LEITA EINSTEIN --------------------------------------------------------------------------------  Indications: Edema.  Limitations: Poor ultrasound/tissue interface, body habitus and immobility. Comparison Study: No previous exams Performing Technologist: Jody Hill RVT, RDMS  Examination Guidelines: A complete evaluation includes B-mode imaging, spectral Doppler, color Doppler, and power Doppler as needed of all accessible portions of each vessel. Bilateral testing is considered an integral part of a complete examination. Limited examinations for reoccurring indications may be performed as noted. The reflux portion of the exam is performed with  the patient in reverse Trendelenburg.  +---------+---------------+---------+-----------+----------+-------------------+ RIGHT    CompressibilityPhasicitySpontaneityPropertiesThrombus Aging      +---------+---------------+---------+-----------+----------+-------------------+ CFV      Full           Yes      Yes                                      +---------+---------------+---------+-----------+----------+-------------------+ SFJ      Full                                                             +---------+---------------+---------+-----------+----------+-------------------+  FV Prox                 Yes      Yes                  Not well visualized +---------+---------------+---------+-----------+----------+-------------------+ FV Mid   Full           Yes      Yes                                      +---------+---------------+---------+-----------+----------+-------------------+ FV DistalFull           Yes      Yes                                      +---------+---------------+---------+-----------+----------+-------------------+ PFV                     Yes      Yes                                      +---------+---------------+---------+-----------+----------+-------------------+ POP      Full           Yes      Yes                                      +---------+---------------+---------+-----------+----------+-------------------+ PTV      Full                                                             +---------+---------------+---------+-----------+----------+-------------------+ PERO     Full                                         Not well visualized +---------+---------------+---------+-----------+----------+-------------------+ Prox FV and PFV not well visualized due to bandage to medial proximal thigh. Patent by color/doppler only.  +---------+---------------+---------+-----------+----------+-------------------+ LEFT      CompressibilityPhasicitySpontaneityPropertiesThrombus Aging      +---------+---------------+---------+-----------+----------+-------------------+ CFV      Full           Yes      Yes                                      +---------+---------------+---------+-----------+----------+-------------------+ SFJ      Full                                                             +---------+---------------+---------+-----------+----------+-------------------+ FV Prox  Full           Yes      Yes                                      +---------+---------------+---------+-----------+----------+-------------------+  FV Mid   Full           Yes      Yes                                      +---------+---------------+---------+-----------+----------+-------------------+ FV DistalFull           Yes      Yes                                      +---------+---------------+---------+-----------+----------+-------------------+ PFV      Full                                                             +---------+---------------+---------+-----------+----------+-------------------+ POP      Full           Yes      Yes                                      +---------+---------------+---------+-----------+----------+-------------------+ PTV                                                   unable to visualize +---------+---------------+---------+-----------+----------+-------------------+ PERO                                                  unable to visualize +---------+---------------+---------+-----------+----------+-------------------+     Summary: BILATERAL: - No evidence of deep vein thrombosis seen in the lower extremities, bilaterally. -No evidence of popliteal cyst, bilaterally.   *See table(s) above for measurements and observations. Electronically signed by Fonda Rim on 02/18/2024 at 4:09:52 PM.    Final    US  RENAL Result Date: 02/16/2024 EXAM: US   Retroperitoneum Complete, Renal. 02/16/2024 04:59:00 PM TECHNIQUE: Real-time ultrasonography of the retroperitoneum renal was performed. COMPARISON: None available CLINICAL HISTORY: AKI (acute kidney injury) FINDINGS: FINDINGS: Trace perihepatic ascites. Small right pleural effusion. RIGHT KIDNEY/URETER: Right kidney measures 11.5 x 5.9 x 5.7 cm with a volume of 204 cm. Normal cortical echogenicity. No hydronephrosis. No calculus. No mass. 8 x 9 x 9 mm simple right upper pole renal cyst, benign. LEFT KIDNEY/URETER: Left kidney measures 13.0 x 5.4 x 6.3 cm with a volume of 231 cm. Normal cortical echogenicity. No hydronephrosis. No calculus. No mass. BLADDER: Bladder decompressed by an indwelling foley catheter. IMPRESSION: 1. No acute findings. 2. 9 mm simple right renal cyst, benign. 3. Bladder decompressed by indwelling Foley catheter. Electronically signed by: Pinkie Pebbles MD 02/16/2024 05:34 PM EST RP Workstation: [id number]   DG CHEST PORT 1 VIEW Result Date: 02/16/2024 EXAM: 1 VIEW(S) XRAY OF THE CHEST 02/16/2024 05:10:48 PM COMPARISON: 02/15/2024 CLINICAL HISTORY: S/P hemodialysis catheter insertion FINDINGS: LINES, TUBES AND DEVICES: New left IJ catheter with tip over right atrium. New enteric tube with  tip coursing into the mid stomach. Endotracheal tube with tip 4.7 cm above carina. Stable right upper extremity PICC with tip in mid SVC. LUNGS AND PLEURA: Stable patchy bilateral lower lung opacities, likely atelectasis. Stable trace bilateral pleural effusions. No pneumothorax. HEART AND MEDIASTINUM: Prosthetic cardiac valve noted. Epicardial pacing wires noted. BONES AND SOFT TISSUES: Right paramidline skin staples noted. No acute osseous abnormality. IMPRESSION: 1. Left internal jugular catheter with tip over the right atrium, appropriate in position. 2. Additional support apparatus as aobve. 3. Stable patchy bilateral lower lung opacities, likely atelectasis. 4. Stable trace bilateral pleural  effusions. Electronically signed by: Pinkie Pebbles MD 02/16/2024 05:29 PM EST RP Workstation: [id number]   DG Chest Port 1 View Result Date: 02/15/2024 CLINICAL DATA:  CHF. EXAM: PORTABLE CHEST 1 VIEW COMPARISON:  02/14/2024 FINDINGS: Endotracheal tube tip is approximately 7.4 cm above the base of the carina. The NG tube passes into the stomach although the distal tip position is not included on the film. Lungs appear hyperexpanded. Interstitial markings are diffusely coarsened with chronic features. Basilar patchy airspace disease with tiny effusions evident. Right PICC line tip overlies the mid SVC level. Telemetry leads overlie the chest. IMPRESSION: 1. Endotracheal tube tip is approximately 7.4 cm above the base of the carina. 2. Basilar patchy airspace disease with tiny effusions. Electronically Signed   By: Camellia Candle M.D.   On: 02/15/2024 06:20   VAS US  UPPER EXTREMITY ARTERIAL DUPLEX Result Date: 02/14/2024  UPPER EXTREMITY DUPLEX STUDY Patient Name:  Tracy Baker  Date of Exam:   02/14/2024 Medical Rec #: [id number]       Accession #:    [id number] Date of Birth: [birth year]       Patient Gender: F Patient Age:   21 years Exam Location:  [facility] Procedure:      VAS US  UPPER EXTREMITY ARTERIAL DUPLEX Referring Phys: LEITA GASKINS --------------------------------------------------------------------------------  Indications: Left hand cool, previous radial not pulsatile.  Comparison Study: No prior exam. Performing Technologist: Edilia Elden Appl  Examination Guidelines: A complete evaluation includes B-mode imaging, spectral Doppler, color Doppler, and power Doppler as needed of all accessible portions of each vessel. Bilateral testing is considered an integral part of a complete examination. Limited examinations for reoccurring indications may be performed as noted.  Left Doppler Findings: +--------------+----------+---------+--------+------------------------+ Site          PSV  (cm/s)Waveform StenosisComments                 +--------------+----------+---------+--------+------------------------+ Subclavian Mid106       triphasic                                 +--------------+----------+---------+--------+------------------------+ Axillary      78        triphasic                                 +--------------+----------+---------+--------+------------------------+ Brachial Prox 72                                                  +--------------+----------+---------+--------+------------------------+ Brachial Mid  Not visualized, bandage. +--------------+----------+---------+--------+------------------------+ Brachial Dist 86        triphasic                                 +--------------+----------+---------+--------+------------------------+ Radial Prox   47        biphasic                                  +--------------+----------+---------+--------+------------------------+ Radial Mid    16        biphasic                                  +--------------+----------+---------+--------+------------------------+ Radial Dist                      occluded                         +--------------+----------+---------+--------+------------------------+ Ulnar Prox    74        triphasic                                 +--------------+----------+---------+--------+------------------------+ Ulnar Mid     50        triphasic                                 +--------------+----------+---------+--------+------------------------+ Ulnar Dist    41        triphasic                                 +--------------+----------+---------+--------+------------------------+ Left distal radial artery demonstrates no flow, appears occluded. Dampened flow noted in the left middle radial artery.  Summary:  Left: Obstruction noted in the distal radial artery. *See table(s) above for measurements and observations.  Electronically signed by Debby Robertson on 02/14/2024 at 10:23:04 PM.    Final    DG CHEST PORT 1 VIEW Result Date: 02/14/2024 CLINICAL DATA:  Hypoxia. EXAM: PORTABLE CHEST 1 VIEW COMPARISON:  02/13/2024. FINDINGS: Endotracheal tube tip is 6.3 cm above the base of the carina. The NG tube passes into the stomach although the distal tip position is not included on the film. The cardio pericardial silhouette is enlarged. Diffuse interstitial opacity evident with persistent bibasilar atelectasis/infiltrate. Bilateral pleural drain seen previously are not evident. No pneumothorax although right apex is somewhat obscured by overlying clothing/bed sheets. Right PICC line tip overlies the mid SVC level. Left IJ sheath remains in place. IMPRESSION: 1. Endotracheal tube tip is 6.3 cm above the base of the carina. 2. Persistent bibasilar atelectasis/infiltrate. 3. Diffuse interstitial opacity suggests edema. Electronically Signed   By: Camellia Candle M.D.   On: 02/14/2024 09:13   DG Abd 1 View Result Date: 02/14/2024 CLINICAL DATA:  NG tube placement. EXAM: ABDOMEN - 1 VIEW COMPARISON:  02/10/2024 FINDINGS: NG tube tip is positioned at the level of the GE junction. Tube could be advanced approximately 10 cm to place the side port below the GE junction. Nonspecific bowel gas pattern within the visualized upper abdomen. IMPRESSION: NG tube tip is positioned at the level of the GE junction.  Tube could be advanced approximately 10 cm to place the side port below the GE junction. Repeat x-ray after repositioning recommended. Electronically Signed   By: Camellia Candle M.D.   On: 02/14/2024 09:11   DG CHEST PORT 1 VIEW Result Date: 02/13/2024 CLINICAL DATA:  Hypoxemia. EXAM: PORTABLE CHEST 1 VIEW COMPARISON:  02/12/2024 FINDINGS: Stable borderline enlarged cardiac silhouette, median sternotomy wires and prosthetic mitral valve. Endotracheal tube in satisfactory position. Nasogastric tube extending into the stomach with its  side hole in the proximal stomach and tip not included. Left jugular Swan-Ganz catheter sheath with its tip in the left innominate vein. Right PICC tip in the inferior aspect of the superior vena cava approximately 2.5 cm above the superior cavoatrial junction. Bilateral chest tubes are unchanged with no pneumothorax seen. Decreased bibasilar and right mid lung zone atelectasis. Skin clips. No acute bony abnormality. IMPRESSION: 1. Decreased bibasilar and right mid lung zone atelectasis. 2. Stable borderline cardiomegaly. 3. Support tubes and lines in satisfactory position. Electronically Signed   By: Elspeth Bathe M.D.   On: 02/13/2024 13:38   ECHOCARDIOGRAM COMPLETE Result Date: 02/12/2024    ECHOCARDIOGRAM REPORT   Patient Name:   Tracy Baker Date of Exam: 02/11/2024 Medical Rec #:  [id number]      Height:       64.0 in Accession #:    [id number]     Weight:       181.4 lb Date of Birth:  [birth year]      BSA:          1.877 m Patient Age:    68 years       BP:           102/67 mmHg Patient Gender: F              HR:           89 bpm. Exam Location:  Inpatient Procedure: 2D Echo (Both Spectral and Color Flow Doppler were utilized during            procedure). Indications:    CHF  History:        Patient has prior history of Echocardiogram examinations, most                 recent 01/22/2024. Endocarditis and Mitral Valve Disease.                  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 Procedure Date: 02/08/2024.  Sonographer:    Jayson Gaskins Referring Phys: [phone] AMY D CLEGG IMPRESSIONS  1. Left ventricular ejection fraction, by estimation, is 50 to 55%. The left ventricle has low normal function. The left ventricle demonstrates regional wall motion abnormalities due to septal pacing. Left ventricular diastolic function could not be evaluated.  2. Right ventricular systolic function is mildly reduced. The right ventricular size is mildly enlarged.  3. The  mitral valve has been repaired/replaced. No evidence of mitral valve regurgitation. No evidence of mitral stenosis. There is a bioprosthetic valve present in the mitral position. Procedure Date: 02/08/2024. Echo findings are consistent with normal  structure and function of the mitral valve prosthesis.  4. The aortic valve is normal in structure. Aortic valve regurgitation is not visualized. No aortic stenosis is present.  5. The inferior vena cava is normal in size with greater than 50% respiratory variability, suggesting right  atrial pressure of 3 mmHg. FINDINGS  Left Ventricle: Left ventricular ejection fraction, by estimation, is 50 to 55%. The left ventricle has low normal function. The left ventricle demonstrates regional wall motion abnormalities. The left ventricular internal cavity size was normal in size. There is no left ventricular hypertrophy. Abnormal (paradoxical) septal motion, consistent with RV pacemaker. Left ventricular diastolic function could not be evaluated due to paced rhythm. Left ventricular diastolic function could not be evaluated. Right Ventricle: The right ventricular size is mildly enlarged. No increase in right ventricular wall thickness. Right ventricular systolic function is mildly reduced. Left Atrium: Left atrial size was normal in size. Right Atrium: Right atrial size was normal in size. Pericardium: There is no evidence of pericardial effusion. Mitral Valve: The mitral valve has been repaired/replaced. No evidence of mitral valve regurgitation. There is a bioprosthetic valve present in the mitral position. Procedure Date: 02/08/2024. Echo findings are consistent with normal structure and function of the mitral valve prosthesis. No evidence of mitral valve stenosis. MV peak gradient, 8.2 mmHg. The mean mitral valve gradient is 2.0 mmHg. Tricuspid Valve: The tricuspid valve is grossly normal. Tricuspid valve regurgitation is mild . No evidence of tricuspid stenosis. Aortic Valve:  The aortic valve is normal in structure. Aortic valve regurgitation is not visualized. No aortic stenosis is present. Aortic valve mean gradient measures 2.0 mmHg. Aortic valve peak gradient measures 2.8 mmHg. Pulmonic Valve: The pulmonic valve was normal in structure. Pulmonic valve regurgitation is not visualized. No evidence of pulmonic stenosis. Aorta: The aortic root is normal in size and structure. Venous: The inferior vena cava is normal in size with greater than 50% respiratory variability, suggesting right atrial pressure of 3 mmHg. IAS/Shunts: The interatrial septum was not well visualized.   Diastology LV e' medial:    5.33 cm/s LV E/e' medial:  22.3 LV e' lateral:   4.90 cm/s LV E/e' lateral: 24.3  LEFT ATRIUM           Index LA Vol (A4C): 38.2 ml 20.35 ml/m  AORTIC VALVE AV Vmax:           84.10 cm/s AV Vmean:          66.900 cm/s AV VTI:            0.098 m AV Peak Grad:      2.8 mmHg AV Mean Grad:      2.0 mmHg LVOT Vmax:         87.00 cm/s LVOT Vmean:        63.600 cm/s LVOT VTI:          0.111 m LVOT/AV VTI ratio: 1.14 MITRAL VALVE MV Area (PHT): 4.10 cm     SHUNTS MV Peak grad:  8.2 mmHg     Systemic VTI: 0.11 m MV Mean grad:  2.0 mmHg MV Vmax:       1.43 m/s MV Vmean:      66.1 cm/s MV Decel Time: 185 msec MV E velocity: 119.00 cm/s MV A velocity: 56.10 cm/s MV E/A ratio:  2.12 Morene Brownie Electronically signed by Morene Brownie Signature Date/Time: 02/12/2024/1:45:18 PM    Final    DG Chest Port 1 View Result Date: 02/12/2024 CLINICAL DATA:  Mitral valve replacement. EXAM: PORTABLE CHEST 1 VIEW COMPARISON:  02/11/2024 FINDINGS: Endotracheal tube tip is 2.9 cm above the base of the carina. The NG tube passes into the stomach although the distal tip position is not included on the film. Left IJ pulmonary artery  catheter tip is in the main pulmonary outflow tract. ECMO cannula has been removed in the interval.Right PICC line and bilateral chest tubes remain in place. Patchy airspace disease  in the right lung and left base is similar with probable layering effusions. IMPRESSION: Interval removal of ECMO cannula. Otherwise no substantial change. Electronically Signed   By: Camellia Candle M.D.   On: 02/12/2024 06:34   DG CHEST PORT 1 VIEW Result Date: 02/11/2024 CLINICAL DATA:  r ECMO. EXAM: PORTABLE CHEST 1 VIEW COMPARISON:  02/10/2024 FINDINGS: Endotracheal tube tip is 3.7 cm above the base of the carina. The NG tube passes into the stomach although the distal tip position is not included on the film. ECMO cannula remains in place. Right PICC line tip is obscured over the mediastinum by support apparatus. Left IJ pulmonary artery catheter tip overlies the main pulmonary outflow tract. Bilateral chest tubes again noted with midline mediastinal/pericardial drain evident. No pneumothorax. Similar diffuse airspace opacity in the right mid and upper lung with streaky density in both lung bases as before. IMPRESSION: 1. No substantial interval change. 2. Support apparatus as described. 3. Similar diffuse airspace opacity in the right mid and upper lung. Electronically Signed   By: Camellia Candle M.D.   On: 02/11/2024 05:44   DG Chest Port 1 View in am Result Date: 02/10/2024 EXAM: 1 VIEW(S) XRAY OF THE CHEST 02/10/2024 07:10:00 AM COMPARISON: 02/09/2024 CLINICAL HISTORY: Encounter for orogastric (OG) tube placement FINDINGS: LINES, TUBES AND DEVICES: Endotracheal tube in place with tip 3.8 cm above the carina. Nasogastric tube in place with tip and side port projecting over the stomach. Stable left IJ central venous catheter, bilateral chest tubes, mediastinal drain, ECMO catheter and right upper extremity PICC. LUNGS AND PLEURA: Slightly increased diffuse interstitial opacities and right upper lung airspace opacity. Moderate right pleural effusion. No pneumothorax. HEART AND MEDIASTINUM: Sternotomy wires and prosthetic cardiac valve noted. BONES AND SOFT TISSUES: No acute osseous abnormality. IMPRESSION:  1. Slightly increased diffuse interstitial and right upper lung airspace opacities Electronically signed by: Waddell Calk MD 02/10/2024 08:10 AM EST RP Workstation: [id number]   DG Abd 1 View Result Date: 02/10/2024 EXAM: 1 VIEW XRAY OF THE ABDOMEN 02/10/2024 07:09:00 AM COMPARISON: Chest radiograph 02/09/2024 CLINICAL HISTORY: Encounter for orogastric (OG) tube placement. FINDINGS: LINES, TUBES AND DEVICES: Enteric tube in place with tip and side port projecting over the stomach. Swan-Ganz catheter in place with tip visualized overlying the main pulmonary artery. Mediastinal drain noted. Bilateral chest tubes noted. Midline skin staples noted. ECMO catheter noted. BOWEL: Nonobstructive bowel gas pattern. SOFT TISSUES: No abnormal calcifications. BONES: No acute fracture. IMPRESSION: 1. Enteric tube in appropriate position with tip and side port projecting over the stomach. 2. Swan-Ganz catheter tip overlying the main pulmonary artery in appropriate position. 3. Mediastinal drain and bilateral chest tubes in appropriate positions. Electronically signed by: Waddell Calk MD 02/10/2024 08:06 AM EST RP Workstation: [id number]   DG Chest Port 1 View Result Date: 02/09/2024 CLINICAL DATA:  Endotracheal intubation. EXAM: PORTABLE CHEST 1 VIEW COMPARISON:  02/09/2024. FINDINGS: The heart size and mediastinal contours are stable. A prosthetic cardiac valve is noted. Increased interstitial and airspace opacities are noted on the right. The left lung is stable. No effusion or pneumothorax is seen. The endotracheal tube terminates 3.4 cm above the carina. An enteric tube courses over the left upper quadrant and out of the field of view. Sternotomy wires and surgical clips course over the midline. A right PICC line, left  Swan-Ganz catheter, bilateral chest tubes and mediastinal drain are stable. A new right tubular device is noted terminating in the hilar region on the left. IMPRESSION: 1. Interstitial and airspace  opacities in the lungs bilaterally, increased on the right from the prior exam. 2. Postoperative lines and tubes as described above. Electronically Signed   By: Leita Birmingham M.D.   On: 02/09/2024 16:33     Results for orders placed or performed during the hospital encounter of 12/28/23 (from the past 48 hours)  Glucose, capillary     Status: Abnormal   Collection Time: 03/08/24 12:27 PM  Result Value Ref Range   Glucose-Capillary 143 (H) 70 - 99 mg/dL    Comment: Glucose reference range applies only to samples taken after fasting for at least 8 hours.  Glucose, capillary     Status: Abnormal   Collection Time: 03/08/24  4:23 PM  Result Value Ref Range   Glucose-Capillary 129 (H) 70 - 99 mg/dL    Comment: Glucose reference range applies only to samples taken after fasting for at least 8 hours.  Glucose, capillary     Status: Abnormal   Collection Time: 03/08/24  7:36 PM  Result Value Ref Range   Glucose-Capillary 120 (H) 70 - 99 mg/dL    Comment: Glucose reference range applies only to samples taken after fasting for at least 8 hours.  Glucose, capillary     Status: Abnormal   Collection Time: 03/09/24 12:00 AM  Result Value Ref Range   Glucose-Capillary 123 (H) 70 - 99 mg/dL    Comment: Glucose reference range applies only to samples taken after fasting for at least 8 hours.  Glucose, capillary     Status: Abnormal   Collection Time: 03/09/24  3:26 AM  Result Value Ref Range   Glucose-Capillary 159 (H) 70 - 99 mg/dL    Comment: Glucose reference range applies only to samples taken after fasting for at least 8 hours.  Renal function panel (daily at 0500)     Status: Abnormal   Collection Time: 03/09/24  4:35 AM  Result Value Ref Range   Sodium 133 (L) 135 - 145 mmol/L   Potassium 4.2 3.5 - 5.1 mmol/L   Chloride 92 (L) 98 - 111 mmol/L   CO2 23 22 - 32 mmol/L   Glucose, Bld 162 (H) 70 - 99 mg/dL    Comment: Glucose reference range applies only to samples taken after fasting for at  least 8 hours.   BUN 108 (H) 8 - 23 mg/dL   Creatinine, Ser 6.48 (H) 0.44 - 1.00 mg/dL   Calcium  9.7 8.9 - 10.3 mg/dL   Phosphorus 6.6 (H) 2.5 - 4.6 mg/dL   Albumin  3.9 3.5 - 5.0 g/dL   GFR, Estimated 14 (L) >60 mL/min    Comment: (NOTE) Calculated using the CKD-EPI Creatinine Equation (2021)    Anion gap 18 (H) 5 - 15    Comment: Performed at [facility], [address], [place], [place] [address]  Magnesium      Status: Abnormal   Collection Time: 03/09/24  4:35 AM  Result Value Ref Range   Magnesium  2.5 (H) 1.7 - 2.4 mg/dL    Comment: Performed at [facility], [address] [place], [place] [address]  Glucose, capillary     Status: Abnormal   Collection Time: 03/09/24  7:50 AM  Result Value Ref Range   Glucose-Capillary 125 (H) 70 - 99 mg/dL    Comment: Glucose reference  range applies only to samples taken after fasting for at least 8 hours.  Glucose, capillary     Status: Abnormal   Collection Time: 03/09/24 12:29 PM  Result Value Ref Range   Glucose-Capillary 134 (H) 70 - 99 mg/dL    Comment: Glucose reference range applies only to samples taken after fasting for at least 8 hours.  Glucose, capillary     Status: Abnormal   Collection Time: 03/09/24  4:27 PM  Result Value Ref Range   Glucose-Capillary 103 (H) 70 - 99 mg/dL    Comment: Glucose reference range applies only to samples taken after fasting for at least 8 hours.  Glucose, capillary     Status: Abnormal   Collection Time: 03/09/24  7:39 PM  Result Value Ref Range   Glucose-Capillary 163 (H) 70 - 99 mg/dL    Comment: Glucose reference range applies only to samples taken after fasting for at least 8 hours.   Comment 1 Notify RN   Glucose, capillary     Status: Abnormal   Collection Time: 03/09/24 11:40 PM  Result Value Ref Range   Glucose-Capillary 142 (H) 70 - 99 mg/dL    Comment: Glucose reference range applies only to samples taken after fasting for at least 8 hours.   Comment 1 Notify RN    Glucose, capillary     Status: Abnormal   Collection Time: 03/10/24  4:58 AM  Result Value Ref Range   Glucose-Capillary 175 (H) 70 - 99 mg/dL    Comment: Glucose reference range applies only to samples taken after fasting for at least 8 hours.   Comment 1 Notify RN   Renal function panel (daily at 0500)     Status: Abnormal   Collection Time: 03/10/24  6:58 AM  Result Value Ref Range   Sodium 133 (L) 135 - 145 mmol/L   Potassium 4.3 3.5 - 5.1 mmol/L   Chloride 93 (L) 98 - 111 mmol/L   CO2 23 22 - 32 mmol/L   Glucose, Bld 166 (H) 70 - 99 mg/dL    Comment: Glucose reference range applies only to samples taken after fasting for at least 8 hours.   BUN 139 (H) 8 - 23 mg/dL   Creatinine, Ser 6.31 (H) 0.44 - 1.00 mg/dL   Calcium  9.6 8.9 - 10.3 mg/dL   Phosphorus 6.5 (H) 2.5 - 4.6 mg/dL   Albumin  3.8 3.5 - 5.0 g/dL   GFR, Estimated 13 (L) >60 mL/min    Comment: (NOTE) Calculated using the CKD-EPI Creatinine Equation (2021)    Anion gap 18 (H) 5 - 15    Comment: Performed at [facility], [address] [place], [place] [address]  Magnesium      Status: Abnormal   Collection Time: 03/10/24  6:58 AM  Result Value Ref Range   Magnesium  2.5 (H) 1.7 - 2.4 mg/dL    Comment: Performed at [facility], [address] [place], [place] [address]  Glucose, capillary     Status: Abnormal   Collection Time: 03/10/24  8:58 AM  Result Value Ref Range   Glucose-Capillary 110 (H) 70 - 99 mg/dL    Comment: Glucose reference range applies only to samples taken after fasting for at least 8 hours.       TRANSESOPHOGEAL ECHO REPORT       Patient Name:   Tracy Baker Date of Exam: 01/01/2024  Medical Rec #:  [id number]      Height:  64.0 in  Accession #:    [id number]     Weight:       154.1 lb  Date of Birth:  12-21-1955      BSA:          1.751 m  Patient Age:    68 years       BP:           168/64 mmHg  Patient Gender: F              HR:           80 bpm.  Exam Location:   Inpatient   Procedure: Transesophageal Echo, Cardiac Doppler, Color Doppler, Saline  Contrast            Bubble Study and 3D Echo (Both Spectral and Color Flow Doppler  were            utilized during procedure).   Indications:     Endocarditis    History:         Patient has no prior history of Echocardiogram  examinations.                  Risk Factors:Current Smoker.    Sonographer:     Koleen Popper RDCS  Referring Phys:  [id number] [facility] A [facility]  Diagnosing Phys: Shelda Bruckner MD     Sonographer Comments: Image acquisition challenging due to respiratory  motion.    PROCEDURE: After discussion of the risks and benefits of a TEE, an  informed consent was obtained from the patient. The transesophogeal probe  was passed without difficulty through the esophogus of the patient. Imaged  were obtained with the patient in a  left lateral decubitus position. Sedation performed by different  physician. The patient was monitored while under deep sedation.  Anesthestetic sedation was provided intravenously by Anesthesiology: 270mg   of Propofol , 80mg  of Lidocaine . The patient's  vital signs; including heart rate, blood pressure, and oxygen saturation;  remained stable throughout the procedure. The patient developed no  complications during the procedure.    IMPRESSIONS     1. Left ventricular ejection fraction, by estimation, is 60 to 65%. The  left ventricle has normal function.   2. Right ventricular systolic function is normal. The right ventricular  size is normal. Mildly increased right ventricular wall thickness.   3. No left atrial/left atrial appendage thrombus was detected. The LAA  emptying velocity was 53 cm/s.   4. There is a very small, mobile echodensity on the anterior leaflet of  the mitral valve consistent with vegetation (see images 10, 29, 35). The  mitral valve is abnormal. Trivial mitral valve regurgitation. No evidence  of mitral stenosis.    5. The aortic valve is tricuspid. Aortic valve regurgitation is not  visualized. No aortic stenosis is present.   6. Agitated saline contrast bubble study was positive with shunting  observed within 3-6 cardiac cycles suggestive of interatrial shunt.   7. 3D performed of the mitral valve and demonstrates Small mobile  echodensity on A2.   Conclusion(s)/Recommendation(s): Small mobile echodensity on A2 segment of  anterior mitral valve. Given clinical scenario, this is consistent with  endocarditis. Findings communicated to primary and ID teams.   FINDINGS   Left Ventricle: Left ventricular ejection fraction, by estimation, is 60  to 65%. The left ventricle has normal function. The left ventricular  internal cavity size was normal in size.   Right Ventricle: The right ventricular size is normal. Mildly  increased  right ventricular wall thickness. Right ventricular systolic function is  normal.   Left Atrium: Left atrial size was normal in size. No left atrial/left  atrial appendage thrombus was detected. The LAA emptying velocity was 53  cm/s.   Right Atrium: Right atrial size was normal in size.   Pericardium: There is no evidence of pericardial effusion.   Mitral Valve: There is a very small, mobile echodensity on the anterior  leaflet of the mitral valve consistent with vegetation (see images 10, 29,  35). The mitral valve is abnormal. Trivial mitral valve regurgitation. No  evidence of mitral valve  stenosis.   Tricuspid Valve: The tricuspid valve is normal in structure. Tricuspid  valve regurgitation is trivial. No evidence of tricuspid stenosis. There  is no evidence of tricuspid valve vegetation.   Aortic Valve: The aortic valve is tricuspid. Aortic valve regurgitation is  not visualized. No aortic stenosis is present. There is no evidence of  aortic valve vegetation.   Pulmonic Valve: The pulmonic valve was grossly normal. Pulmonic valve  regurgitation is trivial. No  evidence of pulmonic stenosis. There is no  evidence of pulmonic valve vegetation.   Aorta: The aortic root and ascending aorta are structurally normal, with  no evidence of dilitation.   IAS/Shunts: No atrial level shunt detected by color flow Doppler. Agitated  saline contrast was given intravenously to evaluate for intracardiac  shunting. Agitated saline contrast bubble study was positive with shunting  observed within 3-6 cardiac cycles  suggestive of interatrial shunt.   Additional Comments: Spectral Doppler performed.   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   PROCEDURE: Median sternotomy, extracorporeal circulation, complex mitral valve repair with triangular resection of anterior leaflet, vegetation, placement of Gore-Tex pseudo chord, and edge-to-edge repair.   SURGEON: Elspeth BROCKS. Kerrin, MD   Discharge Exam: Blood pressure 101/68, pulse 71, temperature 98 F (36.7 C), temperature source Oral, resp. rate 15, height 5' 4 (1.626 m), weight 62.7 kg, SpO2 96%.   General appearance: alert, cooperative, and no distress Heart: regular rate and rhythm Lungs: clear to auscultation bilaterally Abdomen: benign Extremities: LE's wrapped Wound: incis healing well   Discharge Medications:  The patient has been discharged on:   1.Beta Blocker:  Yes [   ]                               No   [ X  ]                              If No, reason: Hypotension, borderline coox  2.Ace Inhibitor/ARB: Yes [   ]  No  [ X   ]                                     If No, reason: Hypotension  3.Statin:   Yes [   ]                  No  [  X]                  If No, reason: No CAD  4.ArleeneBETHA [place]  [   ]                  No   [  X ]                  If No, reason: Recurrent epistaxis  Patient had ACS upon admission: No  Plavix/P2Y12 inhibitor: Yes [   ]                                      No  [ X  ]     Discharge Instructions     Amb Referral to Cardiac Rehabilitation   Complete by: As directed    Diagnosis: Valve Repair   Valve: Mitral   After initial evaluation and assessments completed: Virtual Based Care may be provided alone or in conjunction with Phase 2 Cardiac Rehab based on patient barriers.: Yes   Intensive Cardiac Rehabilitation (ICR) [facility] location only OR Traditional Cardiac Rehabilitation (TCR) *If criteria for ICR are not met will enroll in TCR [facility] only): Yes   Home infusion instructions   Complete by: As directed    Instructions: Flushing of vascular access device: 0.9% NaCl pre/post medication administration and prn patency; Heparin  100 u/ml, 5ml for implanted ports and Heparin  10u/ml, 5ml for all other central venous catheters.      Allergies as of 03/10/2024       Reactions   Codeine Anaphylaxis, Nausea And Vomiting   Bactoshield Chg [chlorhexidine  Gluconate] Dermatitis   Chlorhexidine  Dermatitis, Rash   Red Dye #40 (allura Red) Nausea Only         ASK your doctor about these medications    Allergies as of 03/10/2024       Reactions   Codeine Anaphylaxis, Nausea And Vomiting   Bactoshield Chg [chlorhexidine  Gluconate] Dermatitis   Chlorhexidine  Dermatitis, Rash   Red Dye #40 (allura Red) Nausea Only        Medication List     STOP taking these medications    lovastatin  20 MG tablet Commonly known as:  MEVACOR        TAKE these medications    acetaminophen  325 MG tablet Commonly known as: TYLENOL  Take 2 tablets (650 mg total) by mouth every 6 (six) hours as needed for mild pain (pain score 1-3). What changed:  when to take this reasons to take this   amiodarone  200 MG tablet Commonly known as: PACERONE  Take 1 tablet (200 mg total) by mouth daily.   apixaban  5 MG Tabs tablet Commonly known as: ELIQUIS  Take 1 tablet (5 mg total) by mouth 2 (two) times daily.   clonazePAM  0.5 MG tablet Commonly known as: KLONOPIN  Take 0.25-0.5 mg by mouth at bedtime as needed for anxiety.   feeding supplement (PROSource TF20) liquid Place 60 mLs into feeding  tube 2 (two) times daily.   feeding supplement Liqd Take 237 mLs by mouth 3 (three) times daily between meals.   feeding supplement (VITAL 1.5 CAL) Liqd Place 1,000 mLs into feeding tube daily. Please run at 35 ml per hourly nightly for 12 hours, usually 6p-6a   fluconazole  200 MG tablet Commonly known as: Diflucan  Take 2 tablets (400 mg total) by mouth daily for 10 days. What changed:  how much to take how to take this when to take this additional instructions   megestrol  40 MG tablet Commonly known as: MEGACE  Take 2 tablets (80 mg total) by mouth 2 (two) times daily.   mirtazapine  30 MG tablet Commonly known as: REMERON  Take 1 tablet (30 mg total) by mouth at bedtime.   ondansetron  4 MG tablet Commonly known as: ZOFRAN  Take 1 tablet (4 mg total) by mouth every 8 (eight) hours as needed for nausea or vomiting.   pantoprazole  20 MG tablet Commonly known as: Protonix  Take 1 tablet (20 mg total) by mouth daily.   QUEtiapine  25 MG tablet Commonly known as: SEROQUEL  Take 1 tablet (25 mg total) by mouth at bedtime.   sertraline  25 MG tablet Commonly known as: ZOLOFT  Take 1 tablet (25 mg total) by mouth at bedtime.               Home Infusion Instuctions  (From admission, onward)               fluconazole  oral through 03/20/2024 Commonly known as: DIFLUCAN  400 mg daily  Candida endocarditis  First Dose: Yes Last Day of Therapy:  Labs - Once weekly:  CBC/D and BMP, Labs - Once weekly: ESR and CRP   Ask about: Should I take this medication?               Home Infusion Instuctions  (From admission, onward)             Follow-up Information     Rutha Manuelita HERO, PA-C Follow up on 03/25/2024.   Specialties: Physician Assistant, Thoracic Surgery Why: Appointment is at 11:30AM. Please get a chest xray at 10:30AM prior to your appointment on the 2nd floor of our building. Contact information: [address], [place] [address] [phone]         Kristina Tinnie POUR, PA-C. Schedule an appointment as soon as possible for a visit.   Specialty: Physician Assistant Why: Schedule appointment within 2 weeks for hospital follow up Contact information: [address] [place] [address] [phone]                 Signed:  Rocky Shad, PA-C  03/10/2024, 11:04 AM    "

## 2024-01-11 NOTE — Procedures (Signed)
 Extubation Procedure Note  Patient Details:   Name: Tracy Baker DOB: 12/16/1955 MRN: [id number]   Airway Documentation:    Vent end date: 01/11/24 Vent end time: 0815   Evaluation  O2 sats: stable throughout Complications: No apparent complications Patient did tolerate procedure well. Bilateral Breath Sounds: Clear, Diminished   Yes,  Per rapid wean protocol. Pt was extubated to [name]. Prior to extubation pts NIF= -30 VC= 1L Pt did have audible cuff leak. Pt tolerated well with SVS, no stridor noted and pt was able to state her name.  [name] 01/11/2024, 8:18 AM

## 2024-01-12 ENCOUNTER — Inpatient Hospital Stay (HOSPITAL_COMMUNITY)

## 2024-01-12 DIAGNOSIS — I340 Nonrheumatic mitral (valve) insufficiency: Secondary | ICD-10-CM | POA: Diagnosis not present

## 2024-01-12 DIAGNOSIS — B377 Candidal sepsis: Secondary | ICD-10-CM | POA: Diagnosis not present

## 2024-01-12 LAB — BPAM FFP
Blood Product Expiration Date: 202511102359
Blood Product Expiration Date: 202511102359
ISSUE DATE / TIME: 202511061536
ISSUE DATE / TIME: 202511061536
Unit Type and Rh: 600
Unit Type and Rh: 6200

## 2024-01-12 LAB — CBC
HCT: 30.6 % — ABNORMAL LOW (ref 36.0–46.0)
Hemoglobin: 10.7 g/dL — ABNORMAL LOW (ref 12.0–15.0)
MCH: 30.2 pg (ref 26.0–34.0)
MCHC: 35 g/dL (ref 30.0–36.0)
MCV: 86.4 fL (ref 80.0–100.0)
Platelets: 76 K/uL — ABNORMAL LOW (ref 150–400)
RBC: 3.54 MIL/uL — ABNORMAL LOW (ref 3.87–5.11)
RDW: 17.9 % — ABNORMAL HIGH (ref 11.5–15.5)
WBC: 9.5 K/uL (ref 4.0–10.5)
nRBC: 0.5 % — ABNORMAL HIGH (ref 0.0–0.2)

## 2024-01-12 LAB — COMPREHENSIVE METABOLIC PANEL WITH GFR
ALT: 1510 U/L — ABNORMAL HIGH (ref 0–44)
ALT: 1254 U/L — ABNORMAL HIGH (ref 0–44)
AST: 4697 U/L — ABNORMAL HIGH (ref 15–41)
AST: 4090 U/L — ABNORMAL HIGH (ref 15–41)
Albumin: 2.9 g/dL — ABNORMAL LOW (ref 3.5–5.0)
Albumin: 3 g/dL — ABNORMAL LOW (ref 3.5–5.0)
Alkaline Phosphatase: 154 U/L — ABNORMAL HIGH (ref 38–126)
Alkaline Phosphatase: 157 U/L — ABNORMAL HIGH (ref 38–126)
Anion gap: 12 (ref 5–15)
Anion gap: 12 (ref 5–15)
BUN: 26 mg/dL — ABNORMAL HIGH (ref 8–23)
BUN: 29 mg/dL — ABNORMAL HIGH (ref 8–23)
CO2: 19 mmol/L — ABNORMAL LOW (ref 22–32)
CO2: 19 mmol/L — ABNORMAL LOW (ref 22–32)
Calcium: 7.9 mg/dL — ABNORMAL LOW (ref 8.9–10.3)
Calcium: 8 mg/dL — ABNORMAL LOW (ref 8.9–10.3)
Chloride: 111 mmol/L (ref 98–111)
Chloride: 112 mmol/L — ABNORMAL HIGH (ref 98–111)
Creatinine, Ser: 1.41 mg/dL — ABNORMAL HIGH (ref 0.44–1.00)
Creatinine, Ser: 1.4 mg/dL — ABNORMAL HIGH (ref 0.44–1.00)
GFR, Estimated: 41 mL/min — ABNORMAL LOW (ref >60)
GFR, Estimated: 41 mL/min — ABNORMAL LOW (ref >60)
Glucose, Bld: 176 mg/dL — ABNORMAL HIGH (ref 70–99)
Glucose, Bld: 174 mg/dL — ABNORMAL HIGH (ref 70–99)
Potassium: 4.3 mmol/L (ref 3.5–5.1)
Potassium: 4 mmol/L (ref 3.5–5.1)
Sodium: 142 mmol/L (ref 135–145)
Sodium: 143 mmol/L (ref 135–145)
Total Bilirubin: 2.4 mg/dL — ABNORMAL HIGH (ref 0.0–1.2)
Total Bilirubin: 2.2 mg/dL — ABNORMAL HIGH (ref 0.0–1.2)
Total Protein: 4.7 g/dL — ABNORMAL LOW (ref 6.5–8.1)
Total Protein: 4.9 g/dL — ABNORMAL LOW (ref 6.5–8.1)

## 2024-01-12 LAB — PREPARE FRESH FROZEN PLASMA

## 2024-01-12 LAB — COOXEMETRY PANEL
Carboxyhemoglobin: 2.5 % — ABNORMAL HIGH (ref 0.5–1.5)
Methemoglobin: <0.7 % (ref 0.0–1.5)
O2 Saturation: 66.6 %
Total hemoglobin: 10.5 g/dL — ABNORMAL LOW (ref 12.0–16.0)

## 2024-01-12 LAB — ECHOCARDIOGRAM COMPLETE
AR max vel: 2.9 cm2
AV Peak grad: 5.3 mmHg
Ao pk vel: 1.15 m/s
Area-P 1/2: 3.99 cm2
Calc EF: 69.3 %
Height: 64 in
S' Lateral: 2.6 cm
Single Plane A2C EF: 71.1 %
Single Plane A4C EF: 70.2 %
Weight: 2832.47 [oz_av]

## 2024-01-12 LAB — BASIC METABOLIC PANEL WITH GFR
Anion gap: 13 (ref 5–15)
BUN: 24 mg/dL — ABNORMAL HIGH (ref 8–23)
CO2: 20 mmol/L — ABNORMAL LOW (ref 22–32)
Calcium: 8 mg/dL — ABNORMAL LOW (ref 8.9–10.3)
Chloride: 112 mmol/L — ABNORMAL HIGH (ref 98–111)
Creatinine, Ser: 1.28 mg/dL — ABNORMAL HIGH (ref 0.44–1.00)
GFR, Estimated: 46 mL/min — ABNORMAL LOW (ref >60)
Glucose, Bld: 161 mg/dL — ABNORMAL HIGH (ref 70–99)
Potassium: 4.3 mmol/L (ref 3.5–5.1)
Sodium: 145 mmol/L (ref 135–145)

## 2024-01-12 LAB — GLUCOSE, CAPILLARY
Glucose-Capillary: 148 mg/dL — ABNORMAL HIGH (ref 70–99)
Glucose-Capillary: 171 mg/dL — ABNORMAL HIGH (ref 70–99)
Glucose-Capillary: 164 mg/dL — ABNORMAL HIGH (ref 70–99)
Glucose-Capillary: 139 mg/dL — ABNORMAL HIGH (ref 70–99)
Glucose-Capillary: 114 mg/dL — ABNORMAL HIGH (ref 70–99)

## 2024-01-12 MED ORDER — INSULIN ASPART 100 UNIT/ML IJ SOLN
0.0000 [IU] | Freq: Three times a day (TID) | INTRAMUSCULAR | Status: DC
  Administered 2024-01-12: 3 [IU] via SUBCUTANEOUS
  Administered 2024-01-12: 2 [IU] via SUBCUTANEOUS
  Administered 2024-01-13: 3 [IU] via SUBCUTANEOUS
  Administered 2024-01-13 (×2): 2 [IU] via SUBCUTANEOUS
  Administered 2024-01-14: 3 [IU] via SUBCUTANEOUS
  Administered 2024-01-14 – 2024-01-17 (×8): 2 [IU] via SUBCUTANEOUS
  Administered 2024-01-17: 5 [IU] via SUBCUTANEOUS
  Administered 2024-01-18: 2 [IU] via SUBCUTANEOUS
  Administered 2024-01-18: 3 [IU] via SUBCUTANEOUS
  Administered 2024-01-18 – 2024-01-19 (×3): 2 [IU] via SUBCUTANEOUS
  Administered 2024-01-19 – 2024-01-20 (×2): 5 [IU] via SUBCUTANEOUS
  Administered 2024-01-20 – 2024-01-21 (×3): 2 [IU] via SUBCUTANEOUS
  Administered 2024-01-22: 3 [IU] via SUBCUTANEOUS
  Administered 2024-01-23 – 2024-01-25 (×6): 2 [IU] via SUBCUTANEOUS
  Administered 2024-01-26: 3 [IU] via SUBCUTANEOUS
  Administered 2024-01-27: 2 [IU] via SUBCUTANEOUS
  Administered 2024-01-27: 3 [IU] via SUBCUTANEOUS
  Administered 2024-01-28: 1 [IU] via SUBCUTANEOUS
  Administered 2024-01-28 – 2024-01-29 (×2): 2 [IU] via SUBCUTANEOUS
  Filled 2024-01-12 (×3): qty 2
  Filled 2024-01-12: qty 3
  Filled 2024-01-12 (×2): qty 2
  Filled 2024-01-12: qty 3
  Filled 2024-01-12: qty 2
  Filled 2024-01-12: qty 3
  Filled 2024-01-12 (×4): qty 2
  Filled 2024-01-12: qty 3
  Filled 2024-01-12 (×3): qty 2
  Filled 2024-01-12: qty 3
  Filled 2024-01-12: qty 2
  Filled 2024-01-12: qty 3
  Filled 2024-01-12 (×2): qty 2
  Filled 2024-01-12 (×2): qty 3
  Filled 2024-01-12: qty 5
  Filled 2024-01-12: qty 2
  Filled 2024-01-12: qty 3
  Filled 2024-01-12 (×5): qty 2
  Filled 2024-01-12: qty 5
  Filled 2024-01-12: qty 2
  Filled 2024-01-12: qty 3
  Filled 2024-01-12: qty 1

## 2024-01-12 MED ORDER — PERFLUTREN LIPID MICROSPHERE
1.0000 mL | INTRAVENOUS | Status: AC | PRN
  Administered 2024-01-12: 5 mL via INTRAVENOUS

## 2024-01-12 MED ORDER — FUROSEMIDE 10 MG/ML IJ SOLN
40.0000 mg | Freq: Once | INTRAMUSCULAR | Status: AC
  Administered 2024-01-12: 40 mg via INTRAVENOUS
  Filled 2024-01-12: qty 4

## 2024-01-12 MED ORDER — INSULIN ASPART 100 UNIT/ML IJ SOLN: 0.0000 [IU] | Freq: Every day | INTRAMUSCULAR | Status: DC

## 2024-01-12 MED ORDER — FENTANYL CITRATE (PF) 50 MCG/ML IJ SOSY
25.0000 ug | PREFILLED_SYRINGE | INTRAMUSCULAR | Status: DC | PRN
  Administered 2024-01-12: 25 ug via INTRAVENOUS
  Filled 2024-01-12 (×2): qty 1

## 2024-01-12 MED ORDER — ACETAMINOPHEN 10 MG/ML IV SOLN
1000.0000 mg | Freq: Four times a day (QID) | INTRAVENOUS | Status: AC
  Administered 2024-01-12 (×4): 1000 mg via INTRAVENOUS
  Filled 2024-01-12 (×4): qty 100

## 2024-01-12 MED ORDER — PANTOPRAZOLE SODIUM 40 MG IV SOLR
40.0000 mg | Freq: Every day | INTRAVENOUS | Status: DC
  Administered 2024-01-12 – 2024-01-26 (×15): 40 mg via INTRAVENOUS
  Filled 2024-01-12 (×15): qty 10

## 2024-01-12 MED ORDER — FUROSEMIDE 10 MG/ML IJ SOLN
60.0000 mg | Freq: Two times a day (BID) | INTRAMUSCULAR | Status: DC
  Administered 2024-01-12 – 2024-01-13 (×2): 60 mg via INTRAVENOUS
  Filled 2024-01-12 (×2): qty 6

## 2024-01-12 MED ORDER — METOCLOPRAMIDE HCL 5 MG/ML IJ SOLN
10.0000 mg | Freq: Four times a day (QID) | INTRAMUSCULAR | Status: AC
  Administered 2024-01-12 – 2024-01-13 (×4): 10 mg via INTRAVENOUS
  Filled 2024-01-12 (×4): qty 2

## 2024-01-12 NOTE — Progress Notes (Signed)
 Echocardiogram 2D Echocardiogram has been performed.  Alieu Finnigan N Makahla Kiser,RDCS 01/12/2024, 2:38 PM

## 2024-01-12 NOTE — Plan of Care (Signed)
  Problem: Education: Goal: Knowledge of General Education information will improve Description: Including pain rating scale, medication(s)/side effects and non-pharmacologic comfort measures Outcome: Progressing   Problem: Clinical Measurements: Goal: Ability to maintain clinical measurements within normal limits will improve Outcome: Progressing Goal: Will remain free from infection Outcome: Progressing Goal: Diagnostic test results will improve Outcome: Progressing Goal: Respiratory complications will improve Outcome: Progressing Goal: Cardiovascular complication will be avoided Outcome: Progressing   Problem: Safety: Goal: Ability to remain free from injury will improve Outcome: Progressing   Problem: Education: Goal: Understanding of CV disease, CV risk reduction, and recovery process will improve Outcome: Progressing   Problem: Cardiovascular: Goal: Ability to achieve and maintain adequate cardiovascular perfusion will improve Outcome: Progressing Goal: Vascular access site(s) Level 0-1 will be maintained Outcome: Progressing

## 2024-01-12 NOTE — Progress Notes (Signed)
   [address], [place] [place] [address]             [phone]   POD # 2  Sleeping  BP 95/60   Pulse 80   Temp 99.5 F (37.5 C)   Resp 17   Ht 5' 4 (1.626 m)   Wt 80.3 kg   SpO2 92%   BMI 30.39 kg/m  CVP 15 Milrinone 0.25, epi off   Intake/Output Summary (Last 24 hours) at 01/12/2024 1824 Last data filed at 01/12/2024 1800 Gross per 24 hour  Intake 1511.95 ml  Output 1060 ml  Net 451.95 ml   Continue diuresis Monitor LFTs  Elspeth C. Kerrin, MD [facility] [phone]

## 2024-01-12 NOTE — Progress Notes (Signed)
 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 Kerrin.   Continue on micafungin. Plan to restart antifungal treatment clock of 6 wk. Will continue to monitor CMP for any worsening transaminitis  Montie B. Luiz MD MPH [facility] [phone]

## 2024-01-12 NOTE — Progress Notes (Signed)
 2 Days Post-Op Procedure(s) (LRB): REPAIR, MITRAL VALVE (N/A) ECHOCARDIOGRAM, TRANSESOPHAGEAL, INTRAOPERATIVE (N/A) Subjective: Very sleepy this AM- received fentanyl  and Ativan Appropriate when aroused Denies pain and nausea   Objective: Vital signs in last 24 hours: Temp:  [98.4 F (36.9 C)-100 F (37.8 C)] 99 F (37.2 C) (11/08 0715) Pulse Rate:  [89-90] 89 (11/08 0715) Cardiac Rhythm: A-V Sequential paced (11/07 2000) Resp:  [16-38] 22 (11/08 0715) BP: (91-143)/(59-78) 117/69 (11/08 0700) SpO2:  [87 %-96 %] 92 % (11/08 0715) Arterial Line BP: (89-185)/(45-74) 114/52 (11/08 0715) Weight:  [80.3 kg] 80.3 kg (11/08 0500)  Hemodynamic parameters for last 24 hours: PAP: (25-51)/(10-27) 28/12 CVP:  [7 mmHg-32 mmHg] 8 mmHg CO:  [2.7 L/min-3.3 L/min] 3.2 L/min CI:  [1.6 L/min/m2-2 L/min/m2] 1.9 L/min/m2  Intake/Output from previous day: 11/07 0701 - 11/08 0700 In: 1585.8 [P.O.:90; I.V.:784.3; IV Piggyback:711.5] Out: 1160 [Urine:1090; Chest Tube:70] Intake/Output this shift: No intake/output data recorded.  General appearance: cooperative and no distress Neurologic: no focal deficit Heart: regular rate and rhythm Lungs: diminished breath sounds bibasilar Abdomen: normal findings: soft, non-tender  Lab Results: Recent Labs    01/11/24 1655 01/12/24 0450  WBC 11.5* 9.5  HGB 10.8* 10.7*  HCT 30.8* 30.6*  PLT 90* 76*   BMET:  Recent Labs    01/11/24 1655 01/12/24 0450  NA 146* 145  K 4.2 4.3  CL 111 112*  CO2 21* 20*  GLUCOSE 120* 161*  BUN 16 24*  CREATININE 0.98 1.28*  CALCIUM 7.9* 8.0*    PT/INR:  Recent Labs    01/10/24 1555  LABPROT 20.4*  INR 1.7*   ABG    Component Value Date/Time   PHART 7.387 01/11/2024 0856   HCO3 19.3 (L) 01/11/2024 0856   TCO2 20 (L) 01/11/2024 0856   ACIDBASEDEF 5.0 (H) 01/11/2024 0856   O2SAT 66.6 01/12/2024 0450   CBG (last 3)  Recent Labs    01/11/24 2042 01/11/24 2306 01/12/24 0310  GLUCAP 143* 139* 148*     Assessment/Plan: S/P Procedure(s) (LRB): REPAIR, MITRAL VALVE (N/A) ECHOCARDIOGRAM, TRANSESOPHAGEAL, INTRAOPERATIVE (N/A) POD # 2 NEURO- intact  Over sedated this AM CV- in SR with stable hemodynamics co-ox 66  Dc swan, monitor co-ox = CVP  Milrinone 0.25, epi 2- continue for now to assist with diuresis  ASA, no plan for warfarin s/p repair ID- on micafungin for Candida sepsis/ endocarditis  WBC normal and afebrile RESP- has some effusion bilaterally on CXR  Diurese, IS RENAL- creatinine up slightly   Above preop weight  Continue diuresis ENDO- CBG well controlled  Change SSI to AC and HS GI- nausea improvd, minimize sedating meds  Continue Reglan today  Bili slightly elevated- monitor  Albumin 3.0 - mild protein calorie malnutrition Anemia secondary to ABL- Hgb 10 follow Thrombocytopenia- PLT 76 K  Stop enoxaparin SCD for DVT prophylaxis Cardiac rehab, PT consult   LOS: 10 days    Tracy Baker 01/12/2024

## 2024-01-12 NOTE — Progress Notes (Signed)
 Patient ID: Tracy Baker, female   DOB: [birth year], 68 y.o.   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   Swan: CVP 8-9 PA 35/19 CI 1.8  Co-ox 67%  Currently on milrinone 0.25, Epi at 2. SBP up to 160s.   LFTs markedly elevated today with AST 4697, ALT 1510, tbili 2.4.  Near normal on 11/7.   Fatigued, resting in bed.   Objective:   Weight Range: 80.3 kg Body mass index is 30.39 kg/m.   Vital Signs:   Temp:  [98.6 F (37 C)-100 F (37.8 C)] 99 F (37.2 C) (11/08 0900) Pulse Rate:  [89-90] 89 (11/08 0900) Resp:  [16-38] 20 (11/08 0900) BP: (91-143)/(59-77) 114/65 (11/08 0900) SpO2:  [87 %-94 %] 90 % (11/08 0900) Arterial Line BP: (89-185)/(45-74) 134/62 (11/08 0900) Weight:  [80.3 kg] 80.3 kg (11/08 0500) Last BM Date :  (Pre-Op)  Weight change: Filed Weights   01/10/24 0651 01/11/24 0355 01/12/24 0500  Weight: 66.5 kg 79.5 kg 80.3 kg    Intake/Output:   Intake/Output Summary (Last 24 hours) at 01/12/2024 1141 Last data filed at 01/12/2024 1100 Gross per 24 hour  Intake 1401.74 ml  Output 1215 ml  Net 186.74 ml    Physical Exam  General: NAD Neck: JVP 8 cm, no thyromegaly or thyroid  nodule.  Lungs: Crackles dependently CV: Nondisplaced PMI.  Heart regular S1/S2, no S3/S4, I do not hear a significant murmur.  1+ ankle edema.   Abdomen: Soft, nontender, no hepatosplenomegaly, no distention.  Skin: Intact without lesions or rashes.  Neurologic: Alert and oriented x 3.  Psych: Normal affect. Extremities: No clubbing or cyanosis.  HEENT: Normal.    Telemetry   A-V sequential pacing (Personally reviewed)    Labs    CBC Recent Labs    01/11/24 1655 01/12/24 0450  WBC 11.5* 9.5  HGB 10.8* 10.7*  HCT 30.8* 30.6*  MCV 85.8 86.4  PLT 90* 76*   Basic Metabolic Panel Recent  Labs    01/11/24 0417 01/11/24 0423 01/11/24 1655 01/12/24 0450 01/12/24 0812  NA 146*   < > 146* 145 142  K 3.3*   < > 4.2 4.3 4.3  CL 116*   < > 111 112* 111  CO2 18*   < > 21* 20* 19*  GLUCOSE 207*   < > 120* 161* 176*  BUN 15   < > 16 24* 26*  CREATININE 1.04*   < > 0.98 1.28* 1.41*  CALCIUM 7.8*   < > 7.9* 8.0* 7.9*  MG 2.7*  --  2.3  --   --    < > = values in this interval not displayed.   Liver Function Tests Recent Labs    01/11/24 1655 01/12/24 0812  AST 107* 4,697*  ALT 29 1,510*  ALKPHOS 160* 154*  BILITOT 2.2* 2.4*  PROT 4.6* 4.7*  ALBUMIN 3.0* 2.9*   No results for input(s): LIPASE, AMYLASE in the last 72 hours. Cardiac Enzymes No results for input(s): CKTOTAL, CKMB, CKMBINDEX, TROPONINI in the last 72 hours.  BNP: BNP (last 3 results) No results for input(s): BNP in the last 8760 hours.  ProBNP (last 3 results) No results for input(s): PROBNP in the last 8760 hours.   D-Dimer No results for input(s): DDIMER in the last 72 hours.  Hemoglobin A1C Recent Labs    01/11/24 1200  HGBA1C 5.4   Fasting Lipid Panel No results for input(s): CHOL, HDL, LDLCALC, TRIG, CHOLHDL, LDLDIRECT in the last 72 hours. Thyroid  Function Tests No results for input(s): TSH, T4TOTAL, T3FREE, THYROIDAB in the last 72 hours.  Invalid input(s): FREET3  Other results:   Imaging    DG Chest Port 1 View Result Date: 01/12/2024 EXAM: 1 VIEW(S) XRAY OF THE CHEST 01/12/2024 06:20:00 AM COMPARISON: 01/11/2024 CLINICAL HISTORY: S/P MVR (mitral valve repair) FINDINGS: LINES, TUBES AND DEVICES: Interval extubation and removal of enteric tube. Stable right IJ Swan-Ganz catheter with tip in main pulmonary artery. Interval removal of mediastinal drain. LUNGS AND PLEURA: Increased bibasilar airspace disease. Moderate interstitial edema is similar to minimally progressive. Small bilateral pleural effusions are increased and persistent. No  pneumothorax. HEART AND MEDIASTINUM: Mild cardiomegaly. BONES AND SOFT TISSUES: No acute osseous abnormality. IMPRESSION: 1. Removal of support apparatus, including endotracheal tube. 2. Slightly worsened aeration with congestive heart failure, increased bibasilar atelectasis, and persistent small bilateral pleural effusions. 3. No pneumothorax or other acute complication. Electronically signed by: Rockey Kilts MD 01/12/2024 10:26 AM EST RP Workstation: [id number]     Medications:     Scheduled Medications:  sodium chloride    Intravenous Once   acetaminophen  1,000 mg Oral Q6H   Or   acetaminophen (TYLENOL) oral liquid 160 mg/5 mL  1,000 mg Per Tube Q6H   aspirin EC  325 mg Oral Daily   Or   aspirin  324 mg Per Tube Daily   bisacodyl  10 mg Oral Daily   Or   bisacodyl  10 mg Rectal Daily   Chlorhexidine Gluconate Cloth  6 each Topical Daily   docusate sodium  200 mg Oral Daily   insulin aspart  0-15 Units Subcutaneous TID WC   insulin aspart  0-5 Units Subcutaneous QHS   metoCLOPramide (REGLAN) injection  10 mg Intravenous Q6H   pantoprazole   40 mg Oral Daily   scopolamine  1 patch Transdermal Q72H   [START ON 01/14/2024] scopolamine  2 patch Transdermal Q72H   sodium chloride  flush  3 mL Intravenous Q12H    Infusions:  acetaminophen Stopped (01/12/24 0822)   albumin human 999 mL/hr at 01/11/24 1000   epinephrine 2 mcg/min (01/12/24 1100)   micafungin (MYCAMINE) 150 mg in sodium chloride  0.9 % 100 mL IVPB Stopped (01/12/24 1046)   milrinone 0.25 mcg/kg/min (01/12/24 1106)   niCARDipine Stopped (01/11/24 1225)   nitroGLYCERIN Stopped (01/11/24 2146)   norepinephrine (LEVOPHED) Adult infusion Stopped (01/12/24 0519)   promethazine (PHENERGAN) injection (IM or IVPB) Stopped (01/12/24 0505)    PRN Medications: albumin human, fentaNYL  (SUBLIMAZE ) injection, HYDROmorphone, LORazepam, metoprolol tartrate, midazolam  PF, promethazine (PHENERGAN) injection (IM or IVPB), sodium chloride   flush    Patient Profile   Tracy Baker is a 68 y.o. female with hx of recent nephrolithiasis s/p ureteral stent and lithotripsy at [facility] 10/10 with stent removal.  Admitted with persistent candidemia.   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 Kerrin - [facility] 10/31/5 with no CAD and normal L/R heart pressures.  - Intra-op TEE LVEF 60-65%, RV mildly reduced, small immobile mass on anterior valve leaflet, mod MR, trivial TR - On Milrinone 0.25, epinephrine 2.  Co-ox 67% today though thermo CI consistently reading low.  Now Norva is out.  SBP in 160s.  Wean off epinephrine today, continue milrinone for now.  - CVP  8, Lasix 60 mg IV bid today.   Junctional HR - now A-V sequentially paced at 90  Hx of nephrolithiasis  - s/p ureteral stent and lithotripsy   4. ID - On micafungin for candidal bacteremia/MV endocarditis.   5. Elevated LFTs - Marked jump in LFTs today.  She has been hypertensive, no hypotension.  Co-ox 67% today.  She is on micafungin but has been on this for a week and do not think this caused it. No other clear med to trigger.  - Repeat LFTs now.  - Will get echo today to assess mitral valve, LV/RV function.  - RUQ US   CRITICAL CARE Performed by: Ezra Shuck  Total critical care time: 40 minutes  Critical care time was exclusive of separately billable procedures and treating other patients.  Critical care was necessary to treat or prevent imminent or life-threatening deterioration.  Critical care was time spent personally by me on the following activities: development of treatment plan with patient and/or surrogate as well as nursing, discussions with consultants, evaluation of patient's response to treatment, examination of patient, obtaining history from patient or surrogate, ordering and performing treatments and interventions, ordering and review of laboratory studies, ordering and  review of radiographic studies, pulse oximetry and re-evaluation of patient's condition.   Length of Stay: 10  Ezra Shuck, MD  01/12/2024, 11:41 AM  Advanced Heart Failure Team Pager [phone] (M-F; 7a - 5p)  Please contact [facility] for night-coverage after hours (5p -7a ) and weekends on [email or web address]

## 2024-01-13 ENCOUNTER — Inpatient Hospital Stay (HOSPITAL_COMMUNITY)

## 2024-01-13 DIAGNOSIS — B377 Candidal sepsis: Secondary | ICD-10-CM | POA: Diagnosis not present

## 2024-01-13 LAB — CBC
HCT: 32.1 % — ABNORMAL LOW (ref 36.0–46.0)
Hemoglobin: 11 g/dL — ABNORMAL LOW (ref 12.0–15.0)
MCH: 30.4 pg (ref 26.0–34.0)
MCHC: 34.3 g/dL (ref 30.0–36.0)
MCV: 88.7 fL (ref 80.0–100.0)
Platelets: 57 K/uL — ABNORMAL LOW (ref 150–400)
RBC: 3.62 MIL/uL — ABNORMAL LOW (ref 3.87–5.11)
RDW: 17.5 % — ABNORMAL HIGH (ref 11.5–15.5)
WBC: 8.7 K/uL (ref 4.0–10.5)
nRBC: 1.3 % — ABNORMAL HIGH (ref 0.0–0.2)

## 2024-01-13 LAB — TYPE AND SCREEN
ABO/RH(D): A POS
Antibody Screen: NEGATIVE
Unit division: 0
Unit division: 0
Unit division: 0
Unit division: 0
Unit division: 0
Unit division: 0
Unit division: 0
Unit division: 0

## 2024-01-13 LAB — BPAM RBC
Blood Product Expiration Date: 202511132359
Blood Product Expiration Date: 202511202359
Blood Product Expiration Date: 202511242359
Blood Product Expiration Date: 202511242359
Blood Product Expiration Date: 202511242359
Blood Product Expiration Date: 202511252359
Blood Product Expiration Date: 202511282359
Blood Product Expiration Date: 202511282359
ISSUE DATE / TIME: 202511060915
ISSUE DATE / TIME: 202511060915
ISSUE DATE / TIME: 202511060943
ISSUE DATE / TIME: 202511060943
ISSUE DATE / TIME: 202511061358
ISSUE DATE / TIME: 202511061358
Unit Type and Rh: 6200
Unit Type and Rh: 6200
Unit Type and Rh: 6200
Unit Type and Rh: 6200
Unit Type and Rh: 6200
Unit Type and Rh: 6200
Unit Type and Rh: 6200
Unit Type and Rh: 6200

## 2024-01-13 LAB — COMPREHENSIVE METABOLIC PANEL WITH GFR
ALT: 548 U/L — ABNORMAL HIGH (ref 0–44)
AST: 1205 U/L — ABNORMAL HIGH (ref 15–41)
Albumin: 2.9 g/dL — ABNORMAL LOW (ref 3.5–5.0)
Alkaline Phosphatase: 157 U/L — ABNORMAL HIGH (ref 38–126)
Anion gap: 12 (ref 5–15)
BUN: 34 mg/dL — ABNORMAL HIGH (ref 8–23)
CO2: 22 mmol/L (ref 22–32)
Calcium: 8.1 mg/dL — ABNORMAL LOW (ref 8.9–10.3)
Chloride: 112 mmol/L — ABNORMAL HIGH (ref 98–111)
Creatinine, Ser: 1.45 mg/dL — ABNORMAL HIGH (ref 0.44–1.00)
GFR, Estimated: 39 mL/min — ABNORMAL LOW (ref >60)
Glucose, Bld: 129 mg/dL — ABNORMAL HIGH (ref 70–99)
Potassium: 3.7 mmol/L (ref 3.5–5.1)
Sodium: 146 mmol/L — ABNORMAL HIGH (ref 135–145)
Total Bilirubin: 2.5 mg/dL — ABNORMAL HIGH (ref 0.0–1.2)
Total Protein: 5.2 g/dL — ABNORMAL LOW (ref 6.5–8.1)

## 2024-01-13 LAB — GLUCOSE, CAPILLARY
Glucose-Capillary: 135 mg/dL — ABNORMAL HIGH (ref 70–99)
Glucose-Capillary: 145 mg/dL — ABNORMAL HIGH (ref 70–99)
Glucose-Capillary: 175 mg/dL — ABNORMAL HIGH (ref 70–99)
Glucose-Capillary: 144 mg/dL — ABNORMAL HIGH (ref 70–99)

## 2024-01-13 LAB — COOXEMETRY PANEL
Carboxyhemoglobin: 1.6 % — ABNORMAL HIGH (ref 0.5–1.5)
Methemoglobin: <0.7 % (ref 0.0–1.5)
O2 Saturation: 57.5 %
Total hemoglobin: 11.4 g/dL — ABNORMAL LOW (ref 12.0–16.0)

## 2024-01-13 MED ORDER — FUROSEMIDE 10 MG/ML IJ SOLN: 80.0000 mg | Freq: Two times a day (BID) | INTRAMUSCULAR | Status: DC

## 2024-01-13 MED ORDER — ASPIRIN 325 MG PO TBEC
325.0000 mg | DELAYED_RELEASE_TABLET | Freq: Every day | ORAL | Status: DC
  Administered 2024-01-13 – 2024-01-20 (×8): 325 mg via ORAL
  Filled 2024-01-13 (×8): qty 1

## 2024-01-13 MED ORDER — FUROSEMIDE 10 MG/ML IJ SOLN
80.0000 mg | Freq: Four times a day (QID) | INTRAMUSCULAR | Status: AC
  Administered 2024-01-13 (×2): 80 mg via INTRAVENOUS
  Filled 2024-01-13 (×2): qty 8

## 2024-01-13 MED ORDER — POTASSIUM CHLORIDE 10 MEQ/50ML IV SOLN
10.0000 meq | INTRAVENOUS | Status: AC
  Administered 2024-01-13 (×3): 10 meq via INTRAVENOUS
  Filled 2024-01-13 (×3): qty 50

## 2024-01-13 MED ORDER — POTASSIUM CHLORIDE CRYS ER 20 MEQ PO TBCR
40.0000 meq | EXTENDED_RELEASE_TABLET | Freq: Once | ORAL | Status: AC
  Administered 2024-01-13: 40 meq via ORAL
  Filled 2024-01-13: qty 2

## 2024-01-13 NOTE — Plan of Care (Signed)
  Problem: Activity: Goal: Ability to return to baseline activity level will improve Outcome: Progressing   Problem: Respiratory: Goal: Respiratory status will improve Outcome: Progressing

## 2024-01-13 NOTE — Evaluation (Signed)
 Physical Therapy Evaluation Patient Details Name: Tracy Baker MRN: [id number] DOB: [birth year] Today's Date: 01/13/2024  History of Present Illness  68 y.o. female presents to [facility] hospital on 12/29/2023 with after leaving AMA from [facility] where she was being treated for C. Albicans candidemia. TEE on 10/28 positive for vegetation of mitral valve. Extraction of multiple teeth on 11/3. Mitral valve repair on 11/6. PMH includes thyroid  disease.  Clinical Impression  Pt presents to PT with deficits in functional mobility, gait, balance, strength, power, endurance. Pt is limited by weakness and poor endurance at this time. Pt requires 2 person assist to transfer, one for physical assistance and a 2nd for management of multiple lines/leads. Pt will benefit from frequent mobilization in an effort to reduce risk for falls and to restore independence. Patient will benefit from intensive inpatient follow-up therapy, >3 hours/day.        If plan is discharge home, recommend the following: A lot of help with walking and/or transfers;A lot of help with bathing/dressing/bathroom;Assistance with cooking/housework;Assist for transportation;Help with stairs or ramp for entrance   Can travel by private vehicle        Equipment Recommendations Rolling walker (2 wheels);BSC/3in1 (hopeful for progression to these DME pieces)  Recommendations for Other Services  Rehab consult    Functional Status Assessment       Precautions / Restrictions Precautions Precautions: Fall;Other (comment);Sternal (temporary pacer) Precaution Booklet Issued: No (verbally reviewed sternal precautions) Recall of Precautions/Restrictions: Impaired Restrictions Weight Bearing Restrictions Per Provider Order: No Other Position/Activity Restrictions: sternal precautions      Mobility  Bed Mobility Overal bed mobility: Needs Assistance Bed Mobility: Sit to Sidelying, Rolling Rolling: Mod assist       Sit to sidelying: Max  assist      Transfers Overall transfer level: Needs assistance Equipment used: 1 person hand held assist Transfers: Sit to/from Stand, Bed to chair/wheelchair/BSC Sit to Stand: Mod assist Stand pivot transfers: Max assist              Ambulation/Gait                  Stairs            Wheelchair Mobility     Tilt Bed    Modified Rankin (Stroke Patients Only)       Balance Overall balance assessment: Needs assistance Sitting-balance support: No upper extremity supported, Feet supported Sitting balance-Leahy Scale: Fair     Standing balance support: Single extremity supported, Reliant on assistive device for balance Standing balance-Leahy Scale: Poor                               Pertinent Vitals/Pain Pain Assessment Pain Assessment: Faces Faces Pain Scale: Hurts little more Pain Location: generalized Pain Descriptors / Indicators: Sore Pain Intervention(s): Monitored during session    Home Living Family/patient expects to be discharged to:: Private residence Living Arrangements: Alone Available Help at Discharge: Family;Available PRN/intermittently Type of Home: House Home Access: Stairs to enter Entrance Stairs-Rails: Left Entrance Stairs-Number of Steps: 5   Home Layout: One level Home Equipment: None      Prior Function Prior Level of Function : Independent/Modified Independent;Working/employed;Driving             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) Extremity Assessment: Generalized weakness    Lower Extremity Assessment Lower Extremity Assessment:  Generalized weakness    Cervical / Trunk Assessment Cervical / Trunk Assessment: Kyphotic  Communication   Communication Communication: Impaired Factors Affecting Communication: Reduced clarity of speech    Cognition Arousal: Alert Behavior During Therapy: Flat affect   PT -  Cognitive impairments: Difficult to assess Difficult to assess due to: Impaired communication                     PT - Cognition Comments: pt with soft volume of speech currently, slowed processing time, follows commands with increased time Following commands: Impaired Following commands impaired: Follows one step commands with increased time     Cueing Cueing Techniques: Verbal cues     General Comments General comments (skin integrity, edema, etc.): pt on 45L 85% FiO2 HHFNC    Exercises     Assessment/Plan    PT Assessment Patient needs continued PT services  PT Problem List Decreased strength;Decreased activity tolerance;Decreased balance;Decreased mobility;Decreased knowledge of use of DME;Pain       PT Treatment Interventions      PT Goals (Current goals can be found in the Care Plan section)  Acute Rehab PT Goals Patient Stated Goal: to return to work PT Goal Formulation: With patient Time For Goal Achievement: 01/27/24 Potential to Achieve Goals: Fair    Frequency Min 2X/week     Co-evaluation               AM-PAC PT 6 Clicks Mobility  Outcome Measure Help needed turning from your back to your side while in a flat bed without using bedrails?: A Lot Help needed moving from lying on your back to sitting on the side of a flat bed without using bedrails?: A Lot Help needed moving to and from a bed to a chair (including a wheelchair)?: A Lot Help needed standing up from a chair using your arms (e.g., wheelchair or bedside chair)?: A Lot Help needed to walk in hospital room?: Total Help needed climbing 3-5 steps with a railing? : Total 6 Click Score: 10    End of Session Equipment Utilized During Treatment: Oxygen Activity Tolerance: Patient tolerated treatment well Patient left: in bed;with call bell/phone within reach;with bed alarm set;with nursing/sitter in room Nurse Communication: Mobility status PT Visit Diagnosis: Other abnormalities of  gait and mobility (R26.89);Muscle weakness (generalized) (M62.81)    Time: 9089-9075 PT Time Calculation (min) (ACUTE ONLY): 14 min   Charges:   PT Evaluation $PT Eval Moderate Complexity: 1 Mod   PT General Charges $$ ACUTE PT VISIT: 1 Visit         [name], PT, DPT Acute Rehabilitation Office [phone]   [name] 01/13/2024, 9:37 AM

## 2024-01-13 NOTE — Progress Notes (Signed)

## 2024-01-13 NOTE — Progress Notes (Signed)
   [address], [address] [place] [address]             [phone]   Resting comfortably  BP (!) 129/93   Pulse 80   Temp 99 F (37.2 C)   Resp (!) 22   Ht 5' 4 (1.626 m)   Wt 78.4 kg   SpO2 95%   BMI 29.66 kg/m  Still on HFNC 45L- 95% sat   Intake/Output Summary (Last 24 hours) at 01/13/2024 1819 Last data filed at 01/13/2024 1700 Gross per 24 hour  Intake 977.94 ml  Output 2440 ml  Net -1462.06 ml   Modest response to 1st dose of Lasix- received again at 3 and another dose at 8 pM  Aurora C. Kerrin, MD [facility] [phone]

## 2024-01-13 NOTE — Progress Notes (Signed)
 3 Days Post-Op Procedure(s) (LRB): REPAIR, MITRAL VALVE (N/A) ECHOCARDIOGRAM, TRANSESOPHAGEAL, INTRAOPERATIVE (N/A) Subjective: Up in chair, more alert, denies pain/ nausea at present  Objective: Vital signs in last 24 hours: Temp:  [98 F (36.7 C)-99.9 F (37.7 C)] 98 F (36.7 C) (11/09 0844) Pulse Rate:  [78-89] 80 (11/09 0756) Cardiac Rhythm: A-V Sequential paced (11/09 0200) Resp:  [13-28] 25 (11/09 0756) BP: (95-140)/(56-105) 136/78 (11/09 0600) SpO2:  [85 %-99 %] 95 % (11/09 0756) Arterial Line BP: (117-168)/(51-72) 132/55 (11/08 1545) FiO2 (%):  [85 %-90 %] 85 % (11/09 0756) Weight:  [78.4 kg] 78.4 kg (11/09 0500)  Hemodynamic parameters for last 24 hours: CVP:  [4 mmHg-40 mmHg] 13 mmHg  Intake/Output from previous day: 11/08 0701 - 11/09 0700 In: 725.2 [I.V.:167.1; IV Piggyback:558.2] Out: 1835 [Urine:1835] Intake/Output this shift: Total I/O In: 11.9 [I.V.:11.9] Out: 125 [Urine:125]  General appearance: alert, cooperative, and no distress Neurologic: intact Heart: regular rate and rhythm Lungs: diminished breath sounds bibasilar Abdomen: normal findings: soft, non-tender  Lab Results: Recent Labs    01/12/24 0450 01/13/24 0500  WBC 9.5 8.7  HGB 10.7* 11.0*  HCT 30.6* 32.1*  PLT 76* 57*   BMET:  Recent Labs    01/12/24 1217 01/13/24 0500  NA 143 146*  K 4.0 3.7  CL 112* 112*  CO2 19* 22  GLUCOSE 174* 129*  BUN 29* 34*  CREATININE 1.40* 1.45*  CALCIUM 8.0* 8.1*    PT/INR:  Recent Labs    01/10/24 1555  LABPROT 20.4*  INR 1.7*   ABG    Component Value Date/Time   PHART 7.387 01/11/2024 0856   HCO3 19.3 (L) 01/11/2024 0856   TCO2 20 (L) 01/11/2024 0856   ACIDBASEDEF 5.0 (H) 01/11/2024 0856   O2SAT 57.5 01/13/2024 0458   CBG (last 3)  Recent Labs    01/12/24 1628 01/12/24 2103 01/13/24 0619  GLUCAP 139* 114* 135*    Assessment/Plan: S/P Procedure(s) (LRB): REPAIR, MITRAL VALVE (N/A) ECHOCARDIOGRAM, TRANSESOPHAGEAL,  INTRAOPERATIVE (N/A) POD # 3 NEURO- more alert this Am, no focal deficit CV- in junctional rhythm in 60s, paced DDD at 80  Does A pace but with prolonged PR  co-ox 57, CVP 8- On milrinone 0.25  ASA, no beta blocker at present  Mildly hypertensive  Echo showed normal LV EF, mild RV dysfunction  Mild MR and MS post repair RESP- acute respiratory insufficiency with increased O2 demand overnight  CXR pulmonary edema + effusions  Diurese, pulmonary hygiene RENAL- creatinine stable mildly elevated  Diurese aggressively today ENDO- CBG well controlled Gi- diet as toelrated Hgb 11 Thrombocytopenia- PLT down to 57K,  no evidence of bleeding or thrombosis  Check HITT Ab  Micafungin has been reported to cause thrombocytopenia as well ID - on micafungin for Candida Deconditioning- OOB, PT consult    LOS: 11 days    [name] 01/13/2024

## 2024-01-13 NOTE — Progress Notes (Signed)
 Patient ID: Tracy Baker, female   DOB: 1955/10/09, 68 y.o.   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   CVP:  [4 mmHg-15 mmHg] 9 mmHg   Moderately volume overloaded, good initial response to IV diuresis. Lfts significantly improved. Discussed case with Dr. Kerrin.    Objective:   Weight Range: 78.4 kg Body mass index is 29.66 kg/m.   Vital Signs:   Temp:  [98 F (36.7 C)-99.9 F (37.7 C)] 99.3 F (37.4 C) (11/09 1500) Pulse Rate:  [78-81] 80 (11/09 1500) Resp:  [13-25] 17 (11/09 1500) BP: (95-140)/(56-105) 133/67 (11/09 1500) SpO2:  [85 %-99 %] 96 % (11/09 1500) Arterial Line BP: (132)/(55) 132/55 (11/08 1545) FiO2 (%):  [80 %-90 %] 80 % (11/09 1329) Weight:  [78.4 kg] 78.4 kg (11/09 0500) Last BM Date :  (PTA)  Weight change: Filed Weights   01/11/24 0355 01/12/24 0500 01/13/24 0500  Weight: 79.5 kg 80.3 kg 78.4 kg    Intake/Output:   Intake/Output Summary (Last 24 hours) at 01/13/2024 1541 Last data filed at 01/13/2024 1500 Gross per 24 hour  Intake 946.01 ml  Output 2140 ml  Net -1193.99 ml    Physical Exam  GENERAL: NAD, fair appearing PULM:  Normal work of breathing, CTAB CARDIAC:  JVP: mildly elevated         Normal rate with regular rhythm. Systolic murmur.  1+ edema. Warm and well perfused extremities. ABDOMEN: Soft, non-tender, non-distended. NEUROLOGIC: Patient is oriented x3 with no focal or lateralizing neurologic deficits.     Telemetry   A-V sequential pacing (Personally reviewed)    Labs    CBC Recent Labs    01/12/24 0450 01/13/24 0500  WBC 9.5 8.7  HGB 10.7* 11.0*  HCT 30.6* 32.1*  MCV 86.4 88.7  PLT 76* 57*   Basic Metabolic Panel Recent Labs    88/92/74 0417 01/11/24 0423 01/11/24 1655 01/12/24 0450 01/12/24 1217 01/13/24 0500  NA 146*    < > 146*   < > 143 146*  K 3.3*   < > 4.2   < > 4.0 3.7  CL 116*   < > 111   < > 112* 112*  CO2 18*   < > 21*   < > 19* 22  GLUCOSE 207*   < > 120*   < > 174* 129*  BUN 15   < > 16   < > 29* 34*  CREATININE 1.04*   < > 0.98   < > 1.40* 1.45*  CALCIUM 7.8*   < > 7.9*   < > 8.0* 8.1*  MG 2.7*  --  2.3  --   --   --    < > = values in this interval not displayed.   Liver Function Tests Recent Labs    01/12/24 1217 01/13/24 0500  AST 4,090* 1,205*  ALT 1,254* 548*  ALKPHOS 157* 157*  BILITOT 2.2* 2.5*  PROT 4.9* 5.2*  ALBUMIN 3.0* 2.9*     Medications:     Scheduled Medications:  sodium chloride    Intravenous Once   acetaminophen  1,000 mg Oral Q6H   Or   acetaminophen (TYLENOL) oral liquid 160 mg/5 mL  1,000 mg Per Tube Q6H   aspirin EC  325 mg Oral Daily   bisacodyl  10  mg Oral Daily   Or   bisacodyl  10 mg Rectal Daily   Chlorhexidine Gluconate Cloth  6 each Topical Daily   docusate sodium  200 mg Oral Daily   furosemide  80 mg Intravenous Q6H   insulin aspart  0-15 Units Subcutaneous TID WC   insulin aspart  0-5 Units Subcutaneous QHS   pantoprazole  (PROTONIX ) IV  40 mg Intravenous Daily   scopolamine  1 patch Transdermal Q72H   [START ON 01/14/2024] scopolamine  2 patch Transdermal Q72H   sodium chloride  flush  3 mL Intravenous Q12H    Infusions:  albumin human 999 mL/hr at 01/11/24 1000   epinephrine Stopped (01/12/24 1346)   micafungin (MYCAMINE) 150 mg in sodium chloride  0.9 % 100 mL IVPB Stopped (01/13/24 1051)   milrinone 0.25 mcg/kg/min (01/13/24 1500)   niCARDipine Stopped (01/11/24 1225)   nitroGLYCERIN Stopped (01/11/24 2146)   norepinephrine (LEVOPHED) Adult infusion Stopped (01/12/24 0519)   promethazine (PHENERGAN) injection (IM or IVPB) Stopped (01/13/24 1227)    PRN Medications: albumin human, fentaNYL  (SUBLIMAZE ) injection, HYDROmorphone, LORazepam, metoprolol tartrate, promethazine (PHENERGAN) injection (IM or IVPB), sodium chloride   flush    Patient Profile   Tracy Baker is a 68 y.o. female with hx of recent nephrolithiasis s/p ureteral stent and lithotripsy at [facility] 10/10 with stent removal.  Admitted with persistent candidemia.   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 Kerrin - [facility] 10/31/5 with no CAD and normal L/R heart pressures.  - Intra-op TEE LVEF 60-65%, RV mildly reduced, small immobile mass on anterior valve leaflet, mod MR, trivial TR - On Milrinone 0.25, epi off. Would recommend milrinone today to assist with diuresis, can wean to 0.125 tomorrow - IV lasix 80mg  BID - Can start gentle afterload reduction tomorrwo  - CVP 12, lasix as above  Junctional HR - now A-V sequentially paced at 90  Hx of nephrolithiasis  - s/p ureteral stent and lithotripsy   4. ID - On micafungin for candidal bacteremia/MV endocarditis.   5. Elevated LFTs - Marked jump in LFTs yesterday. Significant improvement today, potentially related to bypass. - Diuresis today  CRITICAL CARE Performed by: [name]   Total critical care time: 35 minutes  Critical care time was exclusive of separately billable procedures and treating other patients.  Critical care was necessary to treat or prevent imminent or life-threatening deterioration.  Critical care was time spent personally by me on the following activities: development of treatment plan with patient and/or surrogate as well as nursing, discussions with consultants, evaluation of patient's response to treatment, examination of patient, obtaining history from patient or surrogate, ordering and performing treatments and interventions, ordering and review of laboratory studies, ordering and review of radiographic studies, pulse oximetry and re-evaluation of patient's condition.

## 2024-01-14 ENCOUNTER — Inpatient Hospital Stay (HOSPITAL_COMMUNITY)

## 2024-01-14 DIAGNOSIS — R5381 Other malaise: Secondary | ICD-10-CM

## 2024-01-14 DIAGNOSIS — I059 Rheumatic mitral valve disease, unspecified: Secondary | ICD-10-CM | POA: Diagnosis not present

## 2024-01-14 DIAGNOSIS — B377 Candidal sepsis: Secondary | ICD-10-CM | POA: Diagnosis not present

## 2024-01-14 LAB — CBC
HCT: 32.4 % — ABNORMAL LOW (ref 36.0–46.0)
Hemoglobin: 10.9 g/dL — ABNORMAL LOW (ref 12.0–15.0)
MCH: 30.4 pg (ref 26.0–34.0)
MCHC: 33.6 g/dL (ref 30.0–36.0)
MCV: 90.3 fL (ref 80.0–100.0)
Platelets: 66 K/uL — ABNORMAL LOW (ref 150–400)
RBC: 3.59 MIL/uL — ABNORMAL LOW (ref 3.87–5.11)
RDW: 17.1 % — ABNORMAL HIGH (ref 11.5–15.5)
WBC: 8.6 K/uL (ref 4.0–10.5)
nRBC: 2 % — ABNORMAL HIGH (ref 0.0–0.2)

## 2024-01-14 LAB — COMPREHENSIVE METABOLIC PANEL WITH GFR
ALT: 286 U/L — ABNORMAL HIGH (ref 0–44)
AST: 398 U/L — ABNORMAL HIGH (ref 15–41)
Albumin: 2.8 g/dL — ABNORMAL LOW (ref 3.5–5.0)
Alkaline Phosphatase: 180 U/L — ABNORMAL HIGH (ref 38–126)
Anion gap: 9 (ref 5–15)
BUN: 35 mg/dL — ABNORMAL HIGH (ref 8–23)
CO2: 27 mmol/L (ref 22–32)
Calcium: 8.1 mg/dL — ABNORMAL LOW (ref 8.9–10.3)
Chloride: 108 mmol/L (ref 98–111)
Creatinine, Ser: 1.22 mg/dL — ABNORMAL HIGH (ref 0.44–1.00)
GFR, Estimated: 48 mL/min — ABNORMAL LOW (ref >60)
Glucose, Bld: 146 mg/dL — ABNORMAL HIGH (ref 70–99)
Potassium: 3.1 mmol/L — ABNORMAL LOW (ref 3.5–5.1)
Sodium: 144 mmol/L (ref 135–145)
Total Bilirubin: 2.4 mg/dL — ABNORMAL HIGH (ref 0.0–1.2)
Total Protein: 5.3 g/dL — ABNORMAL LOW (ref 6.5–8.1)

## 2024-01-14 LAB — GLUCOSE, CAPILLARY
Glucose-Capillary: 147 mg/dL — ABNORMAL HIGH (ref 70–99)
Glucose-Capillary: 170 mg/dL — ABNORMAL HIGH (ref 70–99)
Glucose-Capillary: 129 mg/dL — ABNORMAL HIGH (ref 70–99)
Glucose-Capillary: 114 mg/dL — ABNORMAL HIGH (ref 70–99)

## 2024-01-14 LAB — COOXEMETRY PANEL
Carboxyhemoglobin: 0.5 % (ref 0.5–1.5)
Carboxyhemoglobin: 2 % — ABNORMAL HIGH (ref 0.5–1.5)
Methemoglobin: <0.7 % (ref 0.0–1.5)
Methemoglobin: <0.7 % (ref 0.0–1.5)
O2 Saturation: 43.6 %
O2 Saturation: 51.9 %
Total hemoglobin: 11.4 g/dL — ABNORMAL LOW (ref 12.0–16.0)
Total hemoglobin: 11.5 g/dL — ABNORMAL LOW (ref 12.0–16.0)

## 2024-01-14 LAB — BASIC METABOLIC PANEL WITH GFR
Anion gap: 15 (ref 5–15)
BUN: 36 mg/dL — ABNORMAL HIGH (ref 8–23)
CO2: 24 mmol/L (ref 22–32)
Calcium: 8.4 mg/dL — ABNORMAL LOW (ref 8.9–10.3)
Chloride: 106 mmol/L (ref 98–111)
Creatinine, Ser: 1.11 mg/dL — ABNORMAL HIGH (ref 0.44–1.00)
GFR, Estimated: 54 mL/min — ABNORMAL LOW (ref >60)
Glucose, Bld: 163 mg/dL — ABNORMAL HIGH (ref 70–99)
Potassium: 3.9 mmol/L (ref 3.5–5.1)
Sodium: 145 mmol/L (ref 135–145)

## 2024-01-14 LAB — MAGNESIUM: Magnesium: 1.9 mg/dL (ref 1.7–2.4)

## 2024-01-14 MED ORDER — PROCHLORPERAZINE EDISYLATE 10 MG/2ML IJ SOLN
10.0000 mg | Freq: Four times a day (QID) | INTRAMUSCULAR | Status: DC | PRN
  Administered 2024-01-18: 10 mg via INTRAVENOUS
  Filled 2024-01-14: qty 2

## 2024-01-14 MED ORDER — LORAZEPAM 2 MG/ML IJ SOLN: 0.5000 mg | Freq: Four times a day (QID) | INTRAMUSCULAR | Status: DC | PRN

## 2024-01-14 MED ORDER — FLUCONAZOLE IN SODIUM CHLORIDE 400-0.9 MG/200ML-% IV SOLN
400.0000 mg | INTRAVENOUS | Status: DC
  Administered 2024-01-15 – 2024-03-02 (×47): 400 mg via INTRAVENOUS
  Filled 2024-01-14 (×51): qty 200

## 2024-01-14 MED ORDER — BISACODYL 5 MG PO TBEC
10.0000 mg | DELAYED_RELEASE_TABLET | Freq: Every day | ORAL | Status: DC | PRN
  Filled 2024-01-14: qty 2

## 2024-01-14 MED ORDER — ACETAZOLAMIDE 250 MG PO TABS
500.0000 mg | ORAL_TABLET | Freq: Two times a day (BID) | ORAL | Status: AC
  Administered 2024-01-14 (×2): 500 mg via ORAL
  Filled 2024-01-14 (×2): qty 2

## 2024-01-14 MED ORDER — POTASSIUM CHLORIDE 10 MEQ/50ML IV SOLN
10.0000 meq | INTRAVENOUS | Status: AC
  Administered 2024-01-14 (×5): 10 meq via INTRAVENOUS
  Filled 2024-01-14 (×5): qty 50

## 2024-01-14 MED ORDER — ENSURE PLUS HIGH PROTEIN PO LIQD
237.0000 mL | Freq: Two times a day (BID) | ORAL | Status: DC
  Administered 2024-01-14 – 2024-02-07 (×17): 237 mL via ORAL

## 2024-01-14 MED ORDER — MAGNESIUM SULFATE 2 GM/50ML IV SOLN
2.0000 g | Freq: Once | INTRAVENOUS | Status: AC
  Administered 2024-01-14: 2 g via INTRAVENOUS
  Filled 2024-01-14: qty 50

## 2024-01-14 MED ORDER — FUROSEMIDE 10 MG/ML IJ SOLN
80.0000 mg | Freq: Four times a day (QID) | INTRAMUSCULAR | Status: AC
  Administered 2024-01-14: 80 mg via INTRAVENOUS
  Filled 2024-01-14: qty 8

## 2024-01-14 NOTE — Progress Notes (Signed)
 4 Days Post-Op Procedure(s) (LRB): REPAIR, MITRAL VALVE (N/A) ECHOCARDIOGRAM, TRANSESOPHAGEAL, INTRAOPERATIVE (N/A) Subjective: Nausea and dry heaves have returned Pain well controlled Objective: Vital signs in last 24 hours: Temp:  [98 F (36.7 C)-99.3 F (37.4 C)] 98.6 F (37 C) (11/10 0630) Pulse Rate:  [79-81] 80 (11/10 0715) Cardiac Rhythm: A-V Sequential paced (11/10 0400) Resp:  [12-28] 17 (11/10 0715) BP: (102-146)/(63-102) 127/66 (11/10 0715) SpO2:  [91 %-98 %] 95 % (11/10 0715) FiO2 (%):  [60 %-85 %] 60 % (11/10 0715) Weight:  [76.7 kg] 76.7 kg (11/10 0500)  Hemodynamic parameters for last 24 hours: CVP:  [8 mmHg-14 mmHg] 12 mmHg  Intake/Output from previous day: 11/09 0701 - 11/10 0700 In: 1135.5 [P.O.:718; I.V.:146.2; IV Piggyback:271.3] Out: 3290 [Urine:3290] Intake/Output this shift: No intake/output data recorded.  General appearance: alert, cooperative, and no distress Neurologic: intact Heart: regular rate and rhythm and paced Lungs: diminished breath sounds bibasilar  Lab Results: Recent Labs    01/13/24 0500 01/14/24 0424  WBC 8.7 8.6  HGB 11.0* 10.9*  HCT 32.1* 32.4*  PLT 57* 66*   BMET:  Recent Labs    01/13/24 0500 01/14/24 0424  NA 146* 144  K 3.7 3.1*  CL 112* 108  CO2 22 27  GLUCOSE 129* 146*  BUN 34* 35*  CREATININE 1.45* 1.22*  CALCIUM 8.1* 8.1*    PT/INR: No results for input(s): LABPROT, INR in the last 72 hours. ABG    Component Value Date/Time   PHART 7.387 01/11/2024 0856   HCO3 19.3 (L) 01/11/2024 0856   TCO2 20 (L) 01/11/2024 0856   ACIDBASEDEF 5.0 (H) 01/11/2024 0856   O2SAT 43.6 01/14/2024 0523   CBG (last 3)  Recent Labs    01/13/24 1557 01/13/24 2107 01/14/24 0649  GLUCAP 175* 144* 147*    Assessment/Plan: S/P Procedure(s) (LRB): REPAIR, MITRAL VALVE (N/A) ECHOCARDIOGRAM, TRANSESOPHAGEAL, INTRAOPERATIVE (N/A) Overall looks better but nausea has recurred NEURO- intact CV- in junctional rhythm  in 40s  Milrinone 0.25, co-ox low this AM but everything else better  A pace at 80, continue milrinone RESP- still requiring HFNC   CXR still pulmonary edema and bilateral effusions but improved  Wean O2, IS RENAL- creatinine down to 1.22 from 1.45   Diuresing well- continue IV Lasix today  Hypokalemia- K 3.1- replete IV due to nausea ENDO- CBG mildly elevated  Continue SSI GI- nausea- due for new scopolamine patch this afternoon  Had multiple BM overnight, so GI functioning  LFTs trending down- c/w resolving hepatic congestion Anemia- hgb stable at 10.9 Thrombocytopenia improved- PLT 66K (up from 57K)  No heparin Deconditioning - mobilize, PT  LOS: 12 days    Tracy Baker 01/14/2024

## 2024-01-14 NOTE — Progress Notes (Signed)
 Patient ID: Tracy Baker, female   DOB: 1956-01-10, 68 y.o.   MRN: [id number]     Advanced Heart Failure Rounding Note  Cardiologist: Lonni Cash, MD  Chief Complaint: S/p MV Repair / MV endocarditis  Subjective:    POD #4 s/p MV repair for candidal MV endocarditis.   CVP 10. Diuresed well yesterday, feeling better today. Sitting up in chair. Denies CP. Breathing improved post diuresis, on HHFNC.   Objective:   Weight Range: 76.7 kg Body mass index is 29.02 kg/m.   Vital Signs:   Temp:  [98.4 F (36.9 C)-99.3 F (37.4 C)] 98.6 F (37 C) (11/10 0630) Pulse Rate:  [79-81] 79 (11/10 1000) Resp:  [12-28] 19 (11/10 1000) BP: (102-146)/(63-95) 129/67 (11/10 1000) SpO2:  [91 %-98 %] 94 % (11/10 1000) FiO2 (%):  [50 %-85 %] 50 % (11/10 0948) Weight:  [76.7 kg] 76.7 kg (11/10 0500) Last BM Date : 01/14/24  Weight change: Filed Weights   01/12/24 0500 01/13/24 0500 01/14/24 0500  Weight: 80.3 kg 78.4 kg 76.7 kg    Intake/Output:   Intake/Output Summary (Last 24 hours) at 01/14/2024 1023 Last data filed at 01/14/2024 1000 Gross per 24 hour  Intake 1100.89 ml  Output 3020 ml  Net -1919.11 ml    Physical Exam  General:  frail appearing. + HHFNC Neck: JVD ~10 cm.  Cor: Regular rate & rhythm. 1/6 systolic murmur. Lungs: diminished Extremities: +1 BLE edema  Neuro: alert & oriented x 3. Affect flat.   Telemetry   Paced 80 (Personally reviewed)    Labs    CBC Recent Labs    01/13/24 0500 01/14/24 0424  WBC 8.7 8.6  HGB 11.0* 10.9*  HCT 32.1* 32.4*  MCV 88.7 90.3  PLT 57* 66*   Basic Metabolic Panel Recent Labs    88/92/74 1655 01/12/24 0450 01/13/24 0500 01/14/24 0424  NA 146*   < > 146* 144  K 4.2   < > 3.7 3.1*  CL 111   < > 112* 108  CO2 21*   < > 22 27  GLUCOSE 120*   < > 129* 146*  BUN 16   < > 34* 35*  CREATININE 0.98   < > 1.45* 1.22*  CALCIUM 7.9*   < > 8.1* 8.1*  MG 2.3  --   --  1.9   < > = values in this interval not  displayed.   Liver Function Tests Recent Labs    01/13/24 0500 01/14/24 0424  AST 1,205* 398*  ALT 548* 286*  ALKPHOS 157* 180*  BILITOT 2.5* 2.4*  PROT 5.2* 5.3*  ALBUMIN 2.9* 2.8*     Medications:     Scheduled Medications:  sodium chloride    Intravenous Once   acetaminophen  1,000 mg Oral Q6H   Or   acetaminophen (TYLENOL) oral liquid 160 mg/5 mL  1,000 mg Per Tube Q6H   acetaZOLAMIDE  500 mg Oral BID   aspirin EC  325 mg Oral Daily   Chlorhexidine Gluconate Cloth  6 each Topical Daily   docusate sodium  200 mg Oral Daily   furosemide  80 mg Intravenous Q6H   insulin aspart  0-15 Units Subcutaneous TID WC   insulin aspart  0-5 Units Subcutaneous QHS   pantoprazole  (PROTONIX ) IV  40 mg Intravenous Daily   scopolamine  1 patch Transdermal Q72H   scopolamine  2 patch Transdermal Q72H   sodium chloride  flush  3 mL Intravenous Q12H    Infusions:  albumin human 999 mL/hr at 01/11/24 1000   magnesium sulfate bolus IVPB 50 mL/hr at 01/14/24 1000   micafungin (MYCAMINE) 150 mg in sodium chloride  0.9 % 100 mL IVPB 108 mL/hr at 01/14/24 1000   milrinone 0.25 mcg/kg/min (01/14/24 1000)   potassium chloride 50 mL/hr at 01/14/24 1000    PRN Medications: albumin human, bisacodyl **OR** [DISCONTINUED] bisacodyl, fentaNYL  (SUBLIMAZE ) injection, HYDROmorphone, LORazepam, prochlorperazine, sodium chloride  flush    Patient Profile   Tracy Baker is a 68 y.o. female with hx of recent nephrolithiasis s/p ureteral stent and lithotripsy at [facility] 10/10 with stent removal.  Admitted with persistent candidemia.   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 Kerrin - [facility] 10/31/5 with no CAD and normal L/R heart pressures.  - Intra-op TEE LVEF 60-65%, RV mildly reduced, small immobile mass on anterior valve leaflet, mod MR, trivial TR - On Milrinone 0.25, epi off. Plan to wean tomorrow vs later today.  -  Volume still elevated. CVP 10. Will do 80 IV lasix BID today + diamox 500 mg BID - Can start gentle afterload reduction tomorrow   Junctional HR - now A-V sequentially paced at 90  Hx of nephrolithiasis  - s/p ureteral stent and lithotripsy   4. ID - On micafungin for candidal bacteremia/MV endocarditis.   5. Elevated LFTs - Marked jump in LFTs 11/8. Significant improvement today, potentially related to bypass. - Diuresis today  CRITICAL CARE Performed by: Beckey LITTIE Coe   Total critical care time: 15 minutes  Critical care time was exclusive of separately billable procedures and treating other patients.  Critical care was necessary to treat or prevent imminent or life-threatening deterioration.  Critical care was time spent personally by me on the following activities: development of treatment plan with patient and/or surrogate as well as nursing, discussions with consultants, evaluation of patient's response to treatment, examination of patient, obtaining history from patient or surrogate, ordering and performing treatments and interventions, ordering and review of laboratory studies, ordering and review of radiographic studies, pulse oximetry and re-evaluation of patient's condition.

## 2024-01-14 NOTE — Consult Note (Signed)
 Physical Medicine and Rehabilitation Consult Reason for Consult:debility after MVR Referring Physician: Kerrin   HPI: Tracy Baker is a 68 y.o. female who was recently hospitalized for Candida albicans septicemia.  Apparently she left AMA from [facility] and presented to [facility] on 1025.  TEE on 1028 showed vegetations on the mitral valve.  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  Patient is on micafungin per infectious disease with the treatment window of 6 weeks in total.  Patient being followed closely by the heart failure team with aggressive diuresis.  Increased LFTs with improvement on 01/13/2024.   Patient was up with therapies yesterday and was mod assist for sit to stand transfers and max assist for stand pivot.  Gait was not attempted.  Patient was living alone but independence and working prior to this admit.  She has a 1 level house with 5 steps to enter.  Family are available intermittently.    Home: Home Living Family/patient expects to be discharged to:: Private residence Living Arrangements: Alone Available Help at Discharge: Family, Available PRN/intermittently Type of Home: House Home Access: Stairs to enter Secretary/administrator of Steps: 5 Entrance Stairs-Rails: Left Home Layout: One level Bathroom Shower/Tub: Health Visitor: Standard Home Equipment: None  Functional History: Prior Function Prior Level of Function : Independent/Modified Independent, Working/employed, Driving Mobility Comments: working as a Engineering Geologist Status:  Mobility: Bed Mobility Overal bed mobility: Needs Assistance Bed Mobility: Sit to Sidelying, Rolling Rolling: Mod assist Sit to sidelying: Max assist Transfers Overall transfer level: Needs assistance Equipment used: 1 person hand held assist Transfers: Sit to/from Stand, Bed to chair/wheelchair/BSC Sit to Stand:  Mod assist Bed to/from chair/wheelchair/BSC transfer type:: Stand pivot Stand pivot transfers: Max assist      ADL:    Cognition: Cognition Orientation Level: Oriented X4 Cognition Arousal: Alert Behavior During Therapy: Flat affect   Review of Systems  Constitutional:  Positive for malaise/fatigue.  HENT: Negative.    Eyes: Negative.   Respiratory:  Positive for cough and shortness of breath.   Cardiovascular:  Positive for chest pain.  Gastrointestinal: Negative.   Genitourinary: Negative.   Skin: Negative.   Neurological:  Positive for weakness.  Psychiatric/Behavioral: Negative.     Past Medical History:  Diagnosis Date   Thyroid  disease    Past Surgical History:  Procedure Laterality Date   APPENDECTOMY     AUGMENTATION MAMMAPLASTY Bilateral 1981   removed in 2006   BREAST IMPLANT REMOVAL Bilateral 2007   CHOLECYSTECTOMY  2010   COLONOSCOPY WITH PROPOFOL  N/A 06/30/2021   Procedure: COLONOSCOPY WITH PROPOFOL ;  Surgeon: Jinny Carmine, MD;  Location: [facility] ENDOSCOPY;  Service: Endoscopy;  Laterality: N/A;   INTRAOPERATIVE TRANSESOPHAGEAL ECHOCARDIOGRAM N/A 01/10/2024   Procedure: ECHOCARDIOGRAM, TRANSESOPHAGEAL, INTRAOPERATIVE;  Surgeon: Kerrin Elspeth BROCKS, MD;  Location: [facility] OR;  Service: Open Heart Surgery;  Laterality: N/A;   LAPAROSCOPIC TOTAL HYSTERECTOMY  2006   MITRAL VALVE REPAIR N/A 01/10/2024   Procedure: REPAIR, MITRAL VALVE;  Surgeon: Kerrin Elspeth BROCKS, MD;  Location: [facility] OR;  Service: Open Heart Surgery;  Laterality: N/A;   RIGHT/LEFT HEART CATH AND CORONARY ANGIOGRAPHY N/A 01/04/2024   Procedure: RIGHT/LEFT HEART CATH AND CORONARY ANGIOGRAPHY;  Surgeon: Verlin Lonni BIRCH, MD;  Location: [facility] INVASIVE CV LAB;  Service: Cardiovascular;  Laterality: N/A;   TOOTH EXTRACTION N/A 01/07/2024   Procedure: DENTAL RESTORATION/EXTRACTIONS;  Surgeon: Sheryle Hamilton, DMD;  Location: [facility] OR;  Service:  Oral Surgery;  Laterality: N/A;  EXTRACTION OF TEETH #2, #6,  #7, #16, #29   TRANSESOPHAGEAL ECHOCARDIOGRAM (CATH LAB) N/A 01/01/2024   Procedure: TRANSESOPHAGEAL ECHOCARDIOGRAM;  Surgeon: Lonni Slain, MD;  Location: [facility] INVASIVE CV LAB;  Service: Cardiovascular;  Laterality: N/A;   Family History  Problem Relation Age of Onset   Hypertension Mother    Diabetes Father    Breast cancer Neg Hx    Social History:  reports that she has been smoking cigarettes. She has never used smokeless tobacco. She reports current alcohol use. She reports that she does not use drugs. Allergies:  Allergies  Allergen Reactions   Codeine Anaphylaxis and Nausea And Vomiting   Red Dye #40 (Allura Red) Nausea Only   Medications Prior to Admission  Medication Sig Dispense Refill   acetaminophen (TYLENOL) 325 MG tablet Take 650 mg by mouth daily as needed for fever.     clonazePAM  (KLONOPIN ) 0.5 MG tablet Take 0.25-0.5 mg by mouth at bedtime as needed for anxiety.     [EXPIRED] fluconazole (DIFLUCAN) 200 MG tablet Take 2 tablets by mouth daily for 3 days. Please present to hospital in [place]  for continued treatment. In the meantime, take 2 tablets by mouth daily for 3 days.     ondansetron  (ZOFRAN ) 4 MG tablet Take 1 tablet (4 mg total) by mouth every 8 (eight) hours as needed for nausea or vomiting. 20 tablet 0   pantoprazole  (PROTONIX ) 20 MG tablet Take 1 tablet (20 mg total) by mouth daily. 30 tablet 2   lovastatin  (MEVACOR ) 20 MG tablet Take 1 tablet (20 mg total) by mouth at bedtime. (Patient not taking: Reported on 12/29/2023) 90 tablet 3     Blood pressure 129/67, pulse 80, temperature 98.9 F (37.2 C), temperature source Oral, resp. rate (!) 22, height 5' 4 (1.626 m), weight 76.7 kg, SpO2 95%. Physical Exam Constitutional:      General: She is not in acute distress.    Appearance: She is obese.  HENT:     Head: Normocephalic.     Nose: Nose normal.     Comments: O2 via HFNC Eyes:     Pupils: Pupils are equal, round, and reactive to  light.  Cardiovascular:     Rate and Rhythm: Normal rate.  Pulmonary:     Effort: Pulmonary effort is normal.  Musculoskeletal:        General: Normal range of motion.     Cervical back: Normal range of motion.  Skin:    General: Skin is warm.  Neurological:     Mental Status: She is alert.     Comments: Pt is alert and oriented x 3. Voice/phonation solid. No focal CN findings. Good insight and awareness. Functional memory. MMT: BUE 4-/5 pecs, deltoids and 4/5 biceps, triceps and 4+ HI. BLE: 2+HF, 3- KE and 4/5 ADF/PF. Sensory exam normal for light touch and pain in all 4 limbs. No limb ataxia or cerebellar signs. No abnormal tone appreciated.    Psychiatric:        Mood and Affect: Mood normal.        Behavior: Behavior normal.     Results for orders placed or performed during the hospital encounter of 12/28/23 (from the past 24 hours)  Glucose, capillary     Status: Abnormal   Collection Time: 01/13/24  3:57 PM  Result Value Ref Range   Glucose-Capillary 175 (H) 70 - 99 mg/dL  Glucose, capillary     Status: Abnormal  Collection Time: 01/13/24  9:07 PM  Result Value Ref Range   Glucose-Capillary 144 (H) 70 - 99 mg/dL  Comprehensive metabolic panel     Status: Abnormal   Collection Time: 01/14/24  4:24 AM  Result Value Ref Range   Sodium 144 135 - 145 mmol/L   Potassium 3.1 (L) 3.5 - 5.1 mmol/L   Chloride 108 98 - 111 mmol/L   CO2 27 22 - 32 mmol/L   Glucose, Bld 146 (H) 70 - 99 mg/dL   BUN 35 (H) 8 - 23 mg/dL   Creatinine, Ser 8.77 (H) 0.44 - 1.00 mg/dL   Calcium 8.1 (L) 8.9 - 10.3 mg/dL   Total Protein 5.3 (L) 6.5 - 8.1 g/dL   Albumin 2.8 (L) 3.5 - 5.0 g/dL   AST 601 (H) 15 - 41 U/L   ALT 286 (H) 0 - 44 U/L   Alkaline Phosphatase 180 (H) 38 - 126 U/L   Total Bilirubin 2.4 (H) 0.0 - 1.2 mg/dL   GFR, Estimated 48 (L) >60 mL/min   Anion gap 9 5 - 15  CBC     Status: Abnormal   Collection Time: 01/14/24  4:24 AM  Result Value Ref Range   WBC 8.6 4.0 - 10.5 K/uL   RBC  3.59 (L) 3.87 - 5.11 MIL/uL   Hemoglobin 10.9 (L) 12.0 - 15.0 g/dL   HCT 67.5 (L) 63.9 - 53.9 %   MCV 90.3 80.0 - 100.0 fL   MCH 30.4 26.0 - 34.0 pg   MCHC 33.6 30.0 - 36.0 g/dL   RDW 82.8 (H) 88.4 - 84.4 %   Platelets 66 (L) 150 - 400 K/uL   nRBC 2.0 (H) 0.0 - 0.2 %  Magnesium     Status: None   Collection Time: 01/14/24  4:24 AM  Result Value Ref Range   Magnesium 1.9 1.7 - 2.4 mg/dL  Cooxemetry Panel (carboxy, met, total hgb, O2 sat)     Status: Abnormal   Collection Time: 01/14/24  5:23 AM  Result Value Ref Range   Total hemoglobin 11.4 (L) 12.0 - 16.0 g/dL   O2 Saturation 56.3 %   Carboxyhemoglobin 0.5 0.5 - 1.5 %   Methemoglobin <0.7 0.0 - 1.5 %  Glucose, capillary     Status: Abnormal   Collection Time: 01/14/24  6:49 AM  Result Value Ref Range   Glucose-Capillary 147 (H) 70 - 99 mg/dL  Cooxemetry Panel (carboxy, met, total hgb, O2 sat)     Status: Abnormal   Collection Time: 01/14/24  9:38 AM  Result Value Ref Range   Total hemoglobin 11.5 (L) 12.0 - 16.0 g/dL   O2 Saturation 48.0 %   Carboxyhemoglobin 2.0 (H) 0.5 - 1.5 %   Methemoglobin <0.7 0.0 - 1.5 %  Glucose, capillary     Status: Abnormal   Collection Time: 01/14/24 11:11 AM  Result Value Ref Range   Glucose-Capillary 170 (H) 70 - 99 mg/dL   DG Chest Port 1 View Result Date: 01/14/2024 CLINICAL DATA:  Pulmonary edema. EXAM: PORTABLE CHEST 1 VIEW COMPARISON:  01/13/2024 FINDINGS: The cardio pericardial silhouette is enlarged. Diffuse interstitial and basilar airspace disease has improved slightly in the interval. Small bilateral pleural effusions again noted. No acute bony abnormality. Right IJ sheath remains in place. Telemetry leads overlie the chest. IMPRESSION: 1. Interval improvement in diffuse interstitial and basilar airspace disease. 2. Small bilateral pleural effusions. Electronically Signed   By: Camellia Candle M.D.   On: 01/14/2024 06:42  DG Chest Port 1 View Result Date: 01/13/2024 EXAM: 1 VIEW(S)  XRAY OF THE CHEST 01/13/2024 05:40:00 AM COMPARISON: 01/12/2024. CLINICAL HISTORY: 393515 S/P MVR (mitral valve repair) FINDINGS: LINES, TUBES AND DEVICES: Interval removal of Swan-Ganz catheter. Right IJ sheath remains in place with tip at the mid SVC. LUNGS AND PLEURA: Increased moderate pulmonary edema, most pronounced at the mid to lower lung zones. Increased size of small bilateral pleural effusions with bibasilar opacities, favor to reflect atelectasis. No pneumothorax. HEART AND MEDIASTINUM: Mild cardiomegaly. Median sternotomy. BONES AND SOFT TISSUES: No acute abnormality. IMPRESSION: 1. Interval removal of Swan-Ganz catheter. Right IJ sheath remains in place with tip at the mid SVC. 2. Increased moderate pulmonary edema, most pronounced at the mid to lower lung zones. 3. Increased size of small bilateral pleural effusions with bibasilar opacities, favor to reflect atelectasis. Electronically signed by: Shahmeer Marcelino MD 01/13/2024 09:58 AM EST RP Workstation: [id number]   ECHOCARDIOGRAM COMPLETE Result Date: 01/12/2024    ECHOCARDIOGRAM REPORT   Patient Name:   Jaleea Aggarwal Date of Exam: 01/12/2024 Medical Rec #:  [id number]      Height:       64.0 in Accession #:    [id number]     Weight:       177.0 lb Date of Birth:  1955/10/21      BSA:          1.858 m Patient Age:    68 years       BP:           121/73 mmHg Patient Gender: F              HR:           83 bpm. Exam Location:  Inpatient Procedure: 2D Echo, Color Doppler, Cardiac Doppler and Intracardiac            Opacification Agent (Both Spectral and Color Flow Doppler were            utilized during procedure). Indications:    Mitral Valve Disorder I05.9  History:        Patient has prior history of Echocardiogram examinations, most                 recent 01/10/2024. Mitral Valve Disease; Risk Factors:Thyroid                  Disease.  Sonographer:    Logan Shove RDCS Referring Phys: [phone] EZRA RAMAN [facility]  Sonographer Comments: Image acquisition  challenging due to breast implants. IMPRESSIONS  1. Left ventricular ejection fraction, by estimation, is 65 to 70%. Left ventricular ejection fraction by PLAX is 69 %. The left ventricle has normal function. The left ventricle has no regional wall motion abnormalities. Left ventricular diastolic parameters are indeterminate.  2. Right ventricular systolic function is mildly reduced. The right ventricular size is normal. Tricuspid regurgitation signal is inadequate for assessing PA pressure.  3. Left atrial size was mildly dilated.  4. The mitral valve has been repaired/replaced. Mild mitral valve regurgitation. Mild mitral stenosis. The mean mitral valve gradient is 4.0 mmHg. Procedure Date: 01/10/2024.  5. The aortic valve is tricuspid. Aortic valve regurgitation is not visualized. Aortic valve sclerosis is present, with no evidence of aortic valve stenosis.  6. The inferior vena cava is dilated in size with <50% respiratory variability, suggesting right atrial pressure of 15 mmHg. Comparison(s): A prior study was performed on 01/01/2024. TEE which showed a small mobile echodensity on the anterior mitral  leaflet consistent with a vegetation. Agitated saline was positive for an interatrial shunt. FINDINGS  Left Ventricle: Left ventricular ejection fraction, by estimation, is 65 to 70%. Left ventricular ejection fraction by PLAX is 69 %. The left ventricle has normal function. The left ventricle has no regional wall motion abnormalities. Definity contrast agent was given IV to delineate the left ventricular endocardial borders. The left ventricular internal cavity size was normal in size. There is no left ventricular hypertrophy. Left ventricular diastolic function could not be evaluated due to mitral valve repair. Left ventricular diastolic parameters are indeterminate. Right Ventricle: The right ventricular size is normal. No increase in right ventricular wall thickness. Right ventricular systolic function is  mildly reduced. Tricuspid regurgitation signal is inadequate for assessing PA pressure. Left Atrium: Left atrial size was mildly dilated. Right Atrium: Right atrial size was normal in size. Pericardium: There is no evidence of pericardial effusion. Mitral Valve: The mitral valve has been repaired/replaced. Mild mitral valve regurgitation. Mild mitral valve stenosis. MV peak gradient, 9.9 mmHg. The mean mitral valve gradient is 4.0 mmHg with average heart rate of 85 bpm. Tricuspid Valve: The tricuspid valve is not well visualized. Tricuspid valve regurgitation is trivial. Aortic Valve: The aortic valve is tricuspid. Aortic valve regurgitation is not visualized. Aortic valve sclerosis is present, with no evidence of aortic valve stenosis. Aortic valve peak gradient measures 5.3 mmHg. Pulmonic Valve: The pulmonic valve was not well visualized. Pulmonic valve regurgitation is not visualized. Aorta: The aortic root and ascending aorta are structurally normal, with no evidence of dilitation. Venous: The inferior vena cava is dilated in size with less than 50% respiratory variability, suggesting right atrial pressure of 15 mmHg. IAS/Shunts: The interatrial septum was not well visualized.  LEFT VENTRICLE PLAX 2D LV EF:         Left            Diastology                ventricular     LV e' medial:    4.03 cm/s                ejection        LV E/e' medial:  35.7                fraction by     LV e' lateral:   5.66 cm/s                PLAX is 69      LV E/e' lateral: 25.4                %. LVIDd:         4.20 cm LVIDs:         2.60 cm LV PW:         0.90 cm LV IVS:        0.80 cm LVOT diam:     2.00 cm LVOT Area:     3.14 cm LV IVRT:       222 msec  LV Volumes (MOD) LV vol d, MOD    91.5 ml A2C: LV vol d, MOD    73.2 ml A4C: LV vol s, MOD    26.4 ml A2C: LV vol s, MOD    21.8 ml A4C: LV SV MOD A2C:   65.1 ml LV SV MOD A4C:   73.2 ml LV SV MOD BP:    57.4 ml RIGHT VENTRICLE  IVC RV Basal diam:  2.90 cm    IVC diam:  2.10 cm RV S prime:     7.83 cm/s TAPSE (M-mode): 1.0 cm     PULMONARY VEINS                            Diastolic Velocity: 57.10 cm/s                            S/D Velocity:       1.10                            Systolic Velocity:  63.90 cm/s LEFT ATRIUM             Index        RIGHT ATRIUM           Index LA diam:        3.00 cm 1.62 cm/m   RA Area:     12.60 cm LA Vol (A2C):   35.5 ml 19.11 ml/m  RA Volume:   30.20 ml  16.26 ml/m LA Vol (A4C):   49.6 ml 26.70 ml/m LA Biplane Vol: 44.0 ml 23.69 ml/m  AORTIC VALVE AV Area (Vmax): 2.90 cm AV Vmax:        115.00 cm/s AV Peak Grad:   5.3 mmHg LVOT Vmax:      106.00 cm/s  AORTA Ao Root diam: 2.60 cm Ao Asc diam:  2.40 cm MITRAL VALVE MV Area (PHT): 3.99 cm     SHUNTS MV Peak grad:  9.9 mmHg     Systemic Diam: 2.00 cm MV Mean grad:  4.0 mmHg MV Vmax:       1.58 m/s MV Vmean:      89.6 cm/s MV Decel Time: 190 msec MV E velocity: 144.00 cm/s MV A velocity: 99.30 cm/s MV E/A ratio:  1.45 Emeline Calender Electronically signed by Emeline Calender Signature Date/Time: 01/12/2024/3:33:39 PM    Final    US  Abdomen Limited RUQ (LIVER/GB) Result Date: 01/12/2024 EXAM: Right Upper Quadrant Abdominal Ultrasound 01/12/2024 12:57:00 PM TECHNIQUE: Real-time ultrasonography of the right upper quadrant of the abdomen was performed. The exam is limited due to bandages from recent heart surgery and rib shadowing. COMPARISON: Ultrasound 10 / 26 / 25. CLINICAL HISTORY: [id number] Elevated LFTs 221910 Elevated LFTs FINDINGS: LIVER: The liver demonstrates normal echogenicity. Patent portal vein with antegrade flow. No intrahepatic biliary ductal dilatation. No evidence of mass. BILIARY SYSTEM: Status post cholecystectomy. Common bile duct measures 5 mm. No intrahepatic biliary ductal dilatation. OTHER: No right upper quadrant ascites. IMPRESSION: 1. Cholecystectomy. No biliary dilation. 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 Yes Co-Morbidities requiring supervision/potential complications:  - ID considerations -Transaminitis -Volume management Due to bladder management, bowel management, safety, skin/wound care, disease management, medication administration, pain management, and patient education, does the patient require 24 hr/day rehab nursing? Yes Does the patient require coordinated care of a physician, rehab nurse, therapy disciplines of PT, OT to address physical and functional deficits in the  context of the above medical diagnosis(es)? Yes Addressing deficits in the following areas: balance, endurance, locomotion, strength, transferring, bowel/bladder control, bathing, dressing, feeding, grooming, toileting, and psychosocial support Can the patient actively participate in an intensive therapy program of at least 3 hrs of therapy per day at least 5 days per week? Yes The potential for patient to make measurable gains while on inpatient rehab is excellent Anticipated functional outcomes upon discharge from inpatient rehab are modified independent and supervision  with PT, modified independent and supervision with OT, n/a with SLP. Estimated rehab length of stay to reach the above functional goals is: 7-9 days Anticipated discharge destination: Home Overall Rehab/Functional Prognosis: excellent  POST ACUTE RECOMMENDATIONS: This patient's condition is appropriate for continued rehabilitative care in the following setting: CIR Patient has agreed to participate in recommended program. Yes Note that insurance prior authorization may be required for reimbursement for recommended  care.  Comment: Rehab Admissions Coordinator to follow up.  Pt was independent prior to this health set-back. She is anxious to regain her independence and do what she needs to do to get home. There are  family, it appears, who can provide some assistance.       I have personally performed a face to face diagnostic evaluation of this patient. Additionally, I have examined the patient's medical record including any pertinent labs and radiographic images.    Thanks,  Arthea ONEIDA Gunther, MD 01/14/2024

## 2024-01-14 NOTE — Plan of Care (Signed)
  Problem: Clinical Measurements: Goal: Respiratory complications will improve Outcome: Progressing   Problem: Skin Integrity: Goal: Risk for impaired skin integrity will decrease Outcome: Progressing   

## 2024-01-14 NOTE — Progress Notes (Signed)
 Inpatient Rehab Coordinator Note:  I met with patient, son and daughter at bedside to discuss CIR recommendations and goals/expectations of CIR stay.  We reviewed 3 hrs/day of therapy, physician follow up, and average length of stay 2 weeks (dependent upon progress) with goals of mod I/supervision. Family is interested in pursuing CIR admission for patient. They are able to provide some supervision and patient's sister may be able to come and assist as well. Will continue to follow for potential admission.   Rehab Admissons Coordinator Emmabelle Fear, [place], [place] [phone]

## 2024-01-14 NOTE — Progress Notes (Addendum)
 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 The Candidemia is likely from a urological procedure consisting of lithotripsy and ureteral stent placement. Stent was removed 5 days after placement. Patient ended up with persistent Candida bacteremia. Work up has shown that the patient does not have candidal endophthalmitis but she did have a mitral valve vegetation. The patient is now POD #4 of a mitral valve repair. The patient is currently on Micafungin  - Recommend changing Micafungin to IV Fluconazole 400mg  daily (given her N/V) - The patient will need 6 weeks of anti fungal therapy followed by suppression - At the time of discharge, her IV Fluconazole can be changed to PO - If the patient remains in the hospital and completes therapy here, please call ID prior to discharge  - Stop date for her antibiotics will be February 21, 2024 - She will need follow up with ID outpatient prior to completion of her 6 weeks    ID will sign off at this time. 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 albicans candidemia (HCC) 12/29/2023   Hypokalemia 12/29/2023   Hypocalcemia 12/29/2023   Transaminitis 12/29/2023   Colon cancer screening    Polyp of transverse colon     Current Discharge Medication List     CONTINUE these medications which have NOT CHANGED   Details  acetaminophen (TYLENOL) 325 MG tablet Take 650 mg by mouth daily as needed for fever.    clonazePAM  (KLONOPIN ) 0.5 MG tablet Take 0.25-0.5 mg by mouth at bedtime as needed for anxiety.    ondansetron   (ZOFRAN ) 4 MG tablet Take 1 tablet (4 mg total) by mouth every 8 (eight) hours as needed for nausea or vomiting. Qty: 20 tablet, Refills: 0    pantoprazole  (PROTONIX ) 20 MG tablet Take 1 tablet (20 mg total) by mouth daily. Qty: 30 tablet, Refills: 2   Associated Diagnoses: Gastroesophageal reflux disease, unspecified whether esophagitis present    lovastatin  (MEVACOR ) 20 MG tablet Take 1 tablet (20 mg total) by mouth at bedtime. Qty: 90 tablet, Refills: 3   Associated Diagnoses: Mixed hyperlipidemia       STOP taking these medications     fluconazole (DIFLUCAN) 200 MG tablet Comments:  Reason for Stopping:         Subjective: The patient is sitting up in bed in no apparent distress. She remains on HFNC and appears tachypneic. On a Milrinone drip. Complaining of nausea and some vomiting but was recently able to keep some food down for lunch. No other complaints. Afebrile. No acute events overnight  Review of Systems: Review of Systems  Constitutional:  Negative for chills and fever.  HENT:  Negative for sore throat.   Eyes:  Negative for double vision.  Respiratory:  Positive for shortness of breath. Negative for cough.   Cardiovascular:  Negative for chest pain.  Gastrointestinal:  Negative for abdominal pain, constipation, diarrhea, nausea and vomiting.  Genitourinary:  Negative for dysuria.  Musculoskeletal:  Negative for back pain.  Skin:  Negative for rash.  Neurological:  Negative for focal weakness  and headaches.   Past Medical History:  Diagnosis Date   Thyroid  disease    Social History   Tobacco Use   Smoking status: Every Day    Current packs/day: 1.00    Types: Cigarettes   Smokeless tobacco: Never   Tobacco comments:    About 15-20 cigarettes a day  Substance Use Topics   Alcohol use: Yes   Drug use: Never   Family History  Problem Relation Age of Onset   Hypertension Mother    Diabetes Father    Breast cancer Neg Hx    Allergies  Allergen  Reactions   Codeine Anaphylaxis and Nausea And Vomiting   Red Dye #40 (Allura Red) Nausea Only   Objective: Vitals:   01/14/24 1300 01/14/24 1302 01/14/24 1400 01/14/24 1500  BP:  131/68 (!) 109/57 112/68  Pulse: 80 80 80 80  Resp: 20 (!) 21 19 13   Temp:      TempSrc:      SpO2: 93% 93% 93% 93%  Weight:      Height:       Body mass index is 29.02 kg/m.  General: Acutely ill appearing female in no apparent distress HENT: Moist mucous membranes, normal nose, normal external ears, and normocephalic Neck: Supple, trachea midline, and normal cervical range of motion Eyes: PERRL, EOMI, non-icteric, and normal conjunctivae and lids Lungs: Diminished breath sounds bilaterally  Cardiac: Regular rate and rhythm. Paced. Abdomen: Soft, Non distended, Non tender, active bowel sounds Skin: Intact, no focal erythema or rash, and warm and dry GU: Foley  Musculoskeletal: No obvious skeletal abnormalities Neuro: Alert, no focal neurologic deficits, moves all extremities Psych: Oriented x 3, cooperative  Problem List Items Addressed This Visit       Other   * (Principal) C. albicans candidemia (HCC) - Primary   Treatment already initiated in [place].  Repeat cultures on 10/20 still had candida present.  Repeat from 10/22 not available.  TTE reportedly negative. - Obtain repeat cultures - IV fluconazole - Consult ID tomorrow - Defer TEE to ID        Relevant Medications   fluconazole (DIFLUCAN) IVPB 400 mg (Start on 01/15/2024 10:00 AM)   Evalene CHRISTELLA Munch, MD [facility] 01/14/2024, 3:50 PM

## 2024-01-14 NOTE — PMR Pre-admission (Shared)
 PMR Admission Coordinator Pre-Admission Assessment  Patient: Tracy Baker is an 68 y.o., female MRN: [id number] DOB: 11-17-1955 Height: 5' 4 (162.6 cm) Weight: 76.7 kg  Insurance Information HMO: ***    PPO: ***     PCP: ***     IPA: ***     80/20: ***     OTHER: *** PRIMARY: U[employer] Medicare      Policy#: [id number]      Subscriber: patient CM Name: ***      Phone#: ***     Fax#: *** Pre-Cert#: ***      Employer: *** Benefits:  Phone #: [phone]     Name: *** Eff. Date: ***     Deduct: ***      Out of Pocket Max: ***      Life Max: *** CIR: ***      SNF: *** Outpatient: ***     Co-Pay: *** Home Health: ***      Co-Pay: *** DME: ***     Co-Pay: *** Providers: *** SECONDARY: ***      Policy#: ***     Phone#: ***  Financial Counselor: ***      Phone#: ***  The "Data Collection Information Summary" for patients in Inpatient Rehabilitation Facilities with attached "Privacy Act Statement-Health Care Records" was provided and verbally reviewed with: Patient and Family  Emergency Contact Information Contact Information     Name Relation Home Work Mobile   Austin,Brandi Daughter   [phone]   Conly,[name] Daughter [phone]  [phone]      Other Contacts     Name Relation Home Work Mobile   [place] Son   [phone]       Current Medical History  Patient Admitting Diagnosis: Candidema History of Present Illness: 68 y.o. F with history nephrolithiasis otherwise healthy, presents to [facility] on 10/25 after leaving [facility] in [place] AMA where she was being treated for C. albicans candidemia. First underwent ureteral stent placement and lithotripsy at [facility] 10/10, then stent removal 10/14.  After stent removal, she developed fever, chills, malaise.  [place], where she got worse and presented to the ER at [facility]. Treated with fluconazole. TEE positive for small vegetation on anterior leaflet of mitral valve, consistent with  endocarditis. Ophthalmology performed fundoscopy exam, no fungal ophthalmitis. Patient had L/R heart cath 10/31, teeth extraction 11/3, and mitral valve repair 01/11/24 by Dr. Kerrin for Candida mitral valve endocarditis.  Patient is on micafungin per infectious disease with the treatment window of 6 weeks in total.  Patient being followed closely by the heart failure team with aggressive diuresis.  Increased LFTs with improvement on 01/13/2024. [facility] are recommending intensive rehab for patient to return to independence.     Patient's medical record from [facility] has been reviewed by the rehabilitation admission coordinator and physician.  Past Medical History  Past Medical History:  Diagnosis Date   Thyroid  disease     Has the patient had major surgery during 100 days prior to admission? Yes  Family History   family history includes Diabetes in her father; Hypertension in her mother.  Current Medications  Current Facility-Administered Medications:    0.9 %  sodium chloride  infusion (Manually program via Guardrails IV Fluids), , Intravenous, Once, Holtzman, Ariel Leffew, CRNA   acetaminophen (TYLENOL) tablet 1,000 mg, 1,000 mg, Oral, Q6H, 1,000 mg at 01/14/24 0009 **OR** acetaminophen (TYLENOL) 160 MG/5ML solution 1,000 mg, 1,000 mg, Per Tube, Q6H, Raguel Con RAMAN, PA-C, 1,000  mg at 01/11/24 0603   acetaZOLAMIDE (DIAMOX) tablet 500 mg, 500 mg, Oral, BID, Stoner, Benjamin J, MD, 500 mg at 01/14/24 0944   albumin human 5 % solution 12.5 g, 250 mL, Intravenous, Q15 min PRN, Raguel [name] DEVONNA, Last Rate: 999 mL/hr at 01/11/24 1000, Infusion Verify at 01/11/24 1000   aspirin EC tablet 325 mg, 325 mg, Oral, Daily, Kerrin Elspeth BROCKS, MD, 325 mg at 01/14/24 0944   bisacodyl (DULCOLAX) EC tablet 10 mg, 10 mg, Oral, Daily PRN **OR** [DISCONTINUED] bisacodyl (DULCOLAX) suppository 10 mg, 10 mg, Rectal, Daily, Chambers, Bailey S, PA-C   Chlorhexidine Gluconate Cloth 2 % PADS 6  each, 6 each, Topical, Daily, Hendrickson, Steven C, MD, 6 each at 01/14/24 0946   docusate sodium (COLACE) capsule 200 mg, 200 mg, Oral, Daily, Raguel Con S, PA-C, 200 mg at 01/13/24 1134   feeding supplement (ENSURE PLUS HIGH PROTEIN) liquid 237 mL, 237 mL, Oral, BID BM, Kerrin Elspeth BROCKS, MD, 237 mL at 01/14/24 1050   fentaNYL  (SUBLIMAZE ) injection 25 mcg, 25 mcg, Intravenous, Q2H PRN, Kerrin Elspeth BROCKS, MD, 25 mcg at 01/12/24 2115   furosemide (LASIX) injection 80 mg, 80 mg, Intravenous, Q6H, Kerrin Elspeth BROCKS, MD   HYDROmorphone (DILAUDID) tablet 1-2 mg, 1-2 mg, Oral, Q4H PRN, Hendrickson, Steven C, MD, 1 mg at 01/11/24 1732   insulin aspart (novoLOG) injection 0-15 Units, 0-15 Units, Subcutaneous, TID WC, Hendrickson, Steven C, MD, 2 Units at 01/14/24 0804   insulin aspart (novoLOG) injection 0-5 Units, 0-5 Units, Subcutaneous, QHS, Kerrin Elspeth BROCKS, MD   LORazepam (ATIVAN) injection 0.5 mg, 0.5 mg, Intravenous, Q6H PRN, Hendrickson, Steven C, MD   micafungin (MYCAMINE) 150 mg in sodium chloride  0.9 % 100 mL IVPB, 150 mg, Intravenous, Daily, Kerrin Elspeth BROCKS, MD, Last Rate: 108 mL/hr at 01/14/24 1000, Infusion Verify at 01/14/24 1000   milrinone (PRIMACOR) 20 MG/100 ML (0.2 mg/mL) infusion, 0.25 mcg/kg/min, Intravenous, Continuous, Su, Bailey S, MD, Last Rate: 5.96 mL/hr at 01/14/24 1000, 0.25 mcg/kg/min at 01/14/24 1000   pantoprazole  (PROTONIX ) injection 40 mg, 40 mg, Intravenous, Daily, Kerrin Elspeth BROCKS, MD, 40 mg at 01/14/24 0944   potassium chloride 10 mEq in 50 mL *CENTRAL LINE* IVPB, 10 mEq, Intravenous, Q1 Hr x 5, Kerrin Elspeth BROCKS, MD, Last Rate: 50 mL/hr at 01/14/24 1044, 10 mEq at 01/14/24 1044   prochlorperazine (COMPAZINE) injection 10 mg, 10 mg, Intravenous, Q6H PRN, Hendrickson, Steven C, MD   scopolamine (TRANSDERM-SCOP) 1 MG/3DAYS 1 mg, 1 patch, Transdermal, Q72H, Hendrickson, Steven C, MD, 1 mg at 01/11/24 2130   scopolamine (TRANSDERM-SCOP)  1 MG/3DAYS 2 mg, 2 patch, Transdermal, Q72H, Kerrin Elspeth BROCKS, MD   sodium chloride  flush (NS) 0.9 % injection 3 mL, 3 mL, Intravenous, Q12H, Raguel Con S, PA-C, 3 mL at 01/14/24 0947   sodium chloride  flush (NS) 0.9 % injection 3 mL, 3 mL, Intravenous, PRN, Raguel [name], PA-C  Patients Current Diet:  Diet Order             Diet Carb Modified Fluid consistency: Thin; Room service appropriate? Yes  Diet effective now                   Precautions / Restrictions Precautions Precautions: Fall, Other (comment), Sternal (temporary pacer) Precaution Booklet Issued: No (verbally reviewed sternal precautions) Restrictions Weight Bearing Restrictions Per Provider Order: Yes (sternal precautions) RUE Weight Bearing Per Provider Order: Non weight bearing LUE Weight Bearing Per Provider Order: Non weight bearing Other Position/Activity  Restrictions: sternal precautions   Has the patient had 2 or more falls or a fall with injury in the past year? No  Prior Activity Level    Prior Functional Level Self Care: Did the patient need help bathing, dressing, using the toilet or eating? Independent  Indoor Mobility: Did the patient need assistance with walking from room to room (with or without device)? Independent  Stairs: Did the patient need assistance with internal or external stairs (with or without device)? Independent  Functional Cognition: Did the patient need help planning regular tasks such as shopping or remembering to take medications? Independent  Patient Information Are you of Hispanic, Latino/a,or Spanish origin?: A. No, not of Hispanic, Latino/a, or Spanish origin What is your race?: A. White Do you need or want an interpreter to communicate with a doctor or health care staff?: 0. No  Patient's Response To:  Health Literacy and Transportation Is the patient able to respond to health literacy and transportation needs?: No  Home Assistive Devices /  Equipment Home Equipment: None  Prior Device Use: Indicate devices/aids used by the patient prior to current illness, exacerbation or injury? None of the above  Current Functional Level Cognition  Orientation Level: Oriented X4    Extremity Assessment (includes Sensation/Coordination)  Upper Extremity Assessment: Generalized weakness  Lower Extremity Assessment: Generalized weakness    ADLs       Mobility  Overal bed mobility: Needs Assistance Bed Mobility: Sit to Sidelying, Rolling Rolling: Mod assist Sit to sidelying: Max assist    Transfers  Overall transfer level: Needs assistance Equipment used: 1 person hand held assist Transfers: Sit to/from Stand, Bed to chair/wheelchair/BSC Sit to Stand: Mod assist Bed to/from chair/wheelchair/BSC transfer type:: Stand pivot Stand pivot transfers: Max assist    Ambulation / Gait / Stairs / Wheelchair Mobility       Posture / Balance Balance Overall balance assessment: Needs assistance Sitting-balance support: No upper extremity supported, Feet supported Sitting balance-Leahy Scale: Fair Standing balance support: Single extremity supported, Reliant on assistive device for balance Standing balance-Leahy Scale: Poor    Special considerations/life events  Skin ***   Previous Home Environment (from acute therapy documentation) Living Arrangements: Alone Available Help at Discharge: Family, Available PRN/intermittently Type of Home: House Home Layout: One level Home Access: Stairs to enter Entrance Stairs-Rails: Left Entrance Stairs-Number of Steps: 5 Bathroom Shower/Tub: Health Visitor: Standard Home Care Services: No  Discharge Living Setting Plans for Discharge Living Setting: Patient's home Type of Home at Discharge: House Discharge Home Layout: One level Discharge Home Access: Stairs to enter Entrance Stairs-Rails: Left Entrance Stairs-Number of Steps: 5 Discharge Bathroom Shower/Tub: Walk-in  shower Discharge Bathroom Toilet: Standard Does the patient have any problems obtaining your medications?: No  Social/Family/Support Systems Anticipated Caregiver: [name], daughter Anticipated Caregiver's Contact Information: [phone] Caregiver Availability: Intermittent Discharge Plan Discussed with Primary Caregiver: Yes Is Caregiver In Agreement with Plan?: Yes Does Caregiver/Family have Issues with Lodging/Transportation while Pt is in Rehab?: No  Goals Patient/Family Goal for Rehab: Mod I/supervision PT/OT, independent SLP Pt/Family Agrees to Admission and willing to participate: Yes Program Orientation Provided & Reviewed with Pt/Caregiver Including Roles  & Responsibilities: Yes  Barriers to Discharge: Insurance for SNF coverage  Decrease burden of Care through IP rehab admission: OtherN/A  Possible need for SNF placement upon discharge: not anticipated  Patient Condition: I have reviewed medical records from [facility], spoken with [facility], and patient, son, and daughter. I met with patient at the bedside for  inpatient rehabilitation assessment.  Patient will benefit from ongoing PT, OT, and SLP, can actively participate in 3 hours of therapy a day 5 days of the week, and can make measurable gains during the admission.  Patient will also benefit from the coordinated team approach during an Inpatient Acute Rehabilitation admission.  The patient will receive intensive therapy as well as Rehabilitation physician, nursing, social worker, and care management interventions.  Due to safety, skin/wound care, disease management, medication administration, pain management, and patient education the patient requires 24 hour a day rehabilitation nursing.  The patient is currently *** with mobility and basic ADLs.  Discharge setting and therapy post discharge at [facility] IP discharge location:[id number]} is anticipated.  Patient has agreed to participate in the Acute Inpatient Rehabilitation Program  and will admit {Time; today/tomorrow:10263}.  Preadmission Screen Completed By:  COSMO VEAR PLATTER, 01/14/2024 1:14 PM ______________________________________________________________________   Discussed status with Dr. PIERRETTE on *** at *** and received approval for admission today.  Admission Coordinator:  COSMO VEAR PLATTER, time ***/Date ***   Assessment/Plan: Diagnosis: *** Does the need for close, 24 hr/day Medical supervision in concert with the patient's rehab needs make it unreasonable for this patient to be served in a less intensive setting? {yes_no_potentially:3041433} Co-Morbidities requiring supervision/potential complications: *** Due to {due un:6958565}, does the patient require 24 hr/day rehab nursing? {yes_no_potentially:3041433} Does the patient require coordinated care of a physician, rehab nurse, PT, OT, and SLP to address physical and functional deficits in the context of the above medical diagnosis(es)? {yes_no_potentially:3041433} Addressing deficits in the following areas: {deficits:3041436} Can the patient actively participate in an intensive therapy program of at least 3 hrs of therapy 5 days a week? {yes_no_potentially:3041433} The potential for patient to make measurable gains while on inpatient rehab is {potential:3041437} Anticipated functional outcomes upon discharge from inpatient rehab: {functional outcomes:304600100} PT, {functional outcomes:304600100} OT, {functional outcomes:304600100} SLP Estimated rehab length of stay to reach the above functional goals is: *** Anticipated discharge destination: {anticipated dc setting:21604} 10. Overall Rehab/Functional Prognosis: {potential:3041437}   MD Signature: ***

## 2024-01-14 NOTE — Progress Notes (Signed)
 EVENING ROUNDS NOTE :     [address] [place] [employer] [address]             [phone]               4 Days Post-Op Procedure(s) (LRB): REPAIR, MITRAL VALVE (N/A) ECHOCARDIOGRAM, TRANSESOPHAGEAL, INTRAOPERATIVE (N/A)   Total Length of Stay:  LOS: 12 days  Events:   No events resting    BP 101/63   Pulse 80   Temp 98.7 F (37.1 C) (Axillary)   Resp 12   Ht 5' 4 (1.626 m)   Wt 76.7 kg   SpO2 94%   BMI 29.02 kg/m   CVP:  [9 mmHg-14 mmHg] 14 mmHg PCWP:  [10 mmHg] 10 mmHg  FiO2 (%):  [40 %-75 %] 40 %   albumin human 999 mL/hr at 01/11/24 1000   [START ON 01/15/2024] fluconazole (DIFLUCAN) IV     milrinone 0.25 mcg/kg/min (01/14/24 1700)    I/O last 3 completed shifts: In: 1345 [P.O.:718; I.V.:205.5; IV Piggyback:421.5] Out: 4515 [Urine:4515]      Latest Ref Rng & Units 01/14/2024    4:24 AM 01/13/2024    5:00 AM 01/12/2024    4:50 AM  CBC  WBC 4.0 - 10.5 K/uL 8.6  8.7  9.5   Hemoglobin 12.0 - 15.0 g/dL 89.0  88.9  89.2   Hematocrit 36.0 - 46.0 % 32.4  32.1  30.6   Platelets 150 - 400 K/uL 66  57  76        Latest Ref Rng & Units 01/14/2024    4:24 AM 01/13/2024    5:00 AM 01/12/2024   12:17 PM  BMP  Glucose 70 - 99 mg/dL 853  870  825   BUN 8 - 23 mg/dL 35  34  29   Creatinine 0.44 - 1.00 mg/dL 8.77  8.54  8.59   Sodium 135 - 145 mmol/L 144  146  143   Potassium 3.5 - 5.1 mmol/L 3.1  3.7  4.0   Chloride 98 - 111 mmol/L 108  112  112   CO2 22 - 32 mmol/L 27  22  19    Calcium 8.9 - 10.3 mg/dL 8.1  8.1  8.0     ABG    Component Value Date/Time   PHART 7.387 01/11/2024 0856   PCO2ART 32.1 01/11/2024 0856   PO2ART 64 (L) 01/11/2024 0856   HCO3 19.3 (L) 01/11/2024 0856   TCO2 20 (L) 01/11/2024 0856   ACIDBASEDEF 5.0 (H) 01/11/2024 0856   O2SAT 51.9 01/14/2024 9061       Tracy Rayas, MD 01/14/2024 5:54 PM

## 2024-01-15 ENCOUNTER — Inpatient Hospital Stay (HOSPITAL_COMMUNITY)

## 2024-01-15 ENCOUNTER — Other Ambulatory Visit: Payer: Self-pay

## 2024-01-15 DIAGNOSIS — B377 Candidal sepsis: Secondary | ICD-10-CM | POA: Diagnosis not present

## 2024-01-15 LAB — COMPREHENSIVE METABOLIC PANEL WITH GFR
ALT: 282 U/L — ABNORMAL HIGH (ref 0–44)
AST: 298 U/L — ABNORMAL HIGH (ref 15–41)
Albumin: 2.7 g/dL — ABNORMAL LOW (ref 3.5–5.0)
Alkaline Phosphatase: 206 U/L — ABNORMAL HIGH (ref 38–126)
Anion gap: 11 (ref 5–15)
BUN: 34 mg/dL — ABNORMAL HIGH (ref 8–23)
CO2: 27 mmol/L (ref 22–32)
Calcium: 8.3 mg/dL — ABNORMAL LOW (ref 8.9–10.3)
Chloride: 105 mmol/L (ref 98–111)
Creatinine, Ser: 1.04 mg/dL — ABNORMAL HIGH (ref 0.44–1.00)
GFR, Estimated: 59 mL/min — ABNORMAL LOW (ref >60)
Glucose, Bld: 118 mg/dL — ABNORMAL HIGH (ref 70–99)
Potassium: 3.3 mmol/L — ABNORMAL LOW (ref 3.5–5.1)
Sodium: 143 mmol/L (ref 135–145)
Total Bilirubin: 2.3 mg/dL — ABNORMAL HIGH (ref 0.0–1.2)
Total Protein: 5.3 g/dL — ABNORMAL LOW (ref 6.5–8.1)

## 2024-01-15 LAB — BASIC METABOLIC PANEL WITH GFR
Anion gap: 11 (ref 5–15)
BUN: 31 mg/dL — ABNORMAL HIGH (ref 8–23)
CO2: 25 mmol/L (ref 22–32)
Calcium: 8.3 mg/dL — ABNORMAL LOW (ref 8.9–10.3)
Chloride: 104 mmol/L (ref 98–111)
Creatinine, Ser: 1.02 mg/dL — ABNORMAL HIGH (ref 0.44–1.00)
GFR, Estimated: 60 mL/min — ABNORMAL LOW (ref >60)
Glucose, Bld: 159 mg/dL — ABNORMAL HIGH (ref 70–99)
Potassium: 3.4 mmol/L — ABNORMAL LOW (ref 3.5–5.1)
Sodium: 140 mmol/L (ref 135–145)

## 2024-01-15 LAB — CBC
HCT: 31 % — ABNORMAL LOW (ref 36.0–46.0)
Hemoglobin: 10.3 g/dL — ABNORMAL LOW (ref 12.0–15.0)
MCH: 30.6 pg (ref 26.0–34.0)
MCHC: 33.2 g/dL (ref 30.0–36.0)
MCV: 92 fL (ref 80.0–100.0)
Platelets: 62 K/uL — ABNORMAL LOW (ref 150–400)
RBC: 3.37 MIL/uL — ABNORMAL LOW (ref 3.87–5.11)
RDW: 17.1 % — ABNORMAL HIGH (ref 11.5–15.5)
WBC: 9.1 K/uL (ref 4.0–10.5)
nRBC: 1.5 % — ABNORMAL HIGH (ref 0.0–0.2)

## 2024-01-15 LAB — COOXEMETRY PANEL
Carboxyhemoglobin: 0.6 % (ref 0.5–1.5)
Carboxyhemoglobin: 2.2 % — ABNORMAL HIGH (ref 0.5–1.5)
Methemoglobin: 0.9 % (ref 0.0–1.5)
Methemoglobin: 0.9 % (ref 0.0–1.5)
O2 Saturation: 40.1 %
O2 Saturation: 61.2 %
Total hemoglobin: 11.1 g/dL — ABNORMAL LOW (ref 12.0–16.0)
Total hemoglobin: 8 g/dL — ABNORMAL LOW (ref 12.0–16.0)

## 2024-01-15 LAB — GLUCOSE, CAPILLARY
Glucose-Capillary: 138 mg/dL — ABNORMAL HIGH (ref 70–99)
Glucose-Capillary: 142 mg/dL — ABNORMAL HIGH (ref 70–99)
Glucose-Capillary: 143 mg/dL — ABNORMAL HIGH (ref 70–99)
Glucose-Capillary: 136 mg/dL — ABNORMAL HIGH (ref 70–99)
Glucose-Capillary: 139 mg/dL — ABNORMAL HIGH (ref 70–99)

## 2024-01-15 LAB — SURGICAL PATHOLOGY

## 2024-01-15 LAB — HEPARIN INDUCED PLATELET AB (HIT ANTIBODY): Heparin Induced Plt Ab: 0.055 {OD_unit} (ref 0.000–0.400)

## 2024-01-15 LAB — MAGNESIUM: Magnesium: 2.2 mg/dL (ref 1.7–2.4)

## 2024-01-15 MED ORDER — POTASSIUM CHLORIDE 10 MEQ/50ML IV SOLN
10.0000 meq | INTRAVENOUS | Status: AC
  Administered 2024-01-15 (×5): 10 meq via INTRAVENOUS
  Filled 2024-01-15 (×5): qty 50

## 2024-01-15 MED ORDER — POTASSIUM CHLORIDE CRYS ER 20 MEQ PO TBCR
20.0000 meq | EXTENDED_RELEASE_TABLET | ORAL | Status: DC
  Administered 2024-01-15: 20 meq via ORAL
  Filled 2024-01-15: qty 1

## 2024-01-15 MED ORDER — ACETAZOLAMIDE 250 MG PO TABS
500.0000 mg | ORAL_TABLET | Freq: Once | ORAL | Status: AC
  Administered 2024-01-15: 500 mg via ORAL
  Filled 2024-01-15: qty 2

## 2024-01-15 MED ORDER — POTASSIUM CHLORIDE 10 MEQ/50ML IV SOLN
10.0000 meq | INTRAVENOUS | Status: AC
  Administered 2024-01-15 (×3): 10 meq via INTRAVENOUS
  Filled 2024-01-15 (×3): qty 50

## 2024-01-15 MED ORDER — SODIUM CHLORIDE 0.9% FLUSH: 10.0000 mL | INTRAVENOUS | Status: DC | PRN

## 2024-01-15 MED ORDER — SODIUM CHLORIDE 0.9 % IV SOLN: INTRAVENOUS | Status: DC | PRN

## 2024-01-15 MED ORDER — SPIRONOLACTONE 25 MG PO TABS
25.0000 mg | ORAL_TABLET | Freq: Every day | ORAL | Status: DC
  Administered 2024-01-15 – 2024-01-31 (×17): 25 mg via ORAL
  Filled 2024-01-15 (×17): qty 1

## 2024-01-15 MED ORDER — POTASSIUM CHLORIDE CRYS ER 20 MEQ PO TBCR: 40.0000 meq | EXTENDED_RELEASE_TABLET | Freq: Once | ORAL | Status: DC

## 2024-01-15 MED ORDER — FUROSEMIDE 10 MG/ML IJ SOLN
120.0000 mg | Freq: Once | INTRAVENOUS | Status: AC
  Administered 2024-01-15: 120 mg via INTRAVENOUS
  Filled 2024-01-15: qty 10

## 2024-01-15 MED ORDER — SODIUM CHLORIDE 0.9% FLUSH
10.0000 mL | Freq: Two times a day (BID) | INTRAVENOUS | Status: DC
  Administered 2024-01-15 – 2024-01-16 (×2): 20 mL
  Administered 2024-01-16 – 2024-01-19 (×6): 10 mL
  Administered 2024-01-19: 40 mL
  Administered 2024-01-20: 20 mL
  Administered 2024-01-20: 10 mL
  Administered 2024-01-21 – 2024-01-22 (×2): 20 mL
  Administered 2024-01-23 – 2024-01-25 (×5): 10 mL
  Administered 2024-01-25: 20 mL
  Administered 2024-01-26 – 2024-02-07 (×24): 10 mL

## 2024-01-15 MED FILL — Potassium Chloride Inj 2 mEq/ML: INTRAVENOUS | Qty: 20 | Status: AC

## 2024-01-15 MED FILL — Heparin Sodium (Porcine) Inj 1000 Unit/ML: INTRAMUSCULAR | Qty: 40 | Status: AC

## 2024-01-15 MED FILL — Albumin, Human Inj 5%: INTRAVENOUS | Qty: 500 | Status: AC

## 2024-01-15 MED FILL — Electrolyte-R (PH 7.4) Solution: INTRAVENOUS | Qty: 6000 | Status: AC

## 2024-01-15 MED FILL — Sodium Chloride IV Soln 0.9%: INTRAVENOUS | Qty: 2000 | Status: AC

## 2024-01-15 MED FILL — Mannitol IV Soln 20%: INTRAVENOUS | Qty: 500 | Status: AC

## 2024-01-15 MED FILL — Lidocaine HCl Local Preservative Free (PF) Inj 2%: INTRAMUSCULAR | Qty: 14 | Status: AC

## 2024-01-15 MED FILL — Sodium Bicarbonate IV Soln 8.4%: INTRAVENOUS | Qty: 100 | Status: AC

## 2024-01-15 MED FILL — Calcium Chloride Inj 10%: INTRAVENOUS | Qty: 10 | Status: AC

## 2024-01-15 NOTE — TOC Progression Note (Addendum)
 Transition of Care [facility]) - Progression Note    Patient Details  Name: Tracy Baker MRN: [id number] Date of Birth: 01/13/1956  Transition of [facility]) CM/SW Contact  Justina Delcia Czar, RN Phone Number: [phone] 01/15/2024, 2:12 PM  Clinical Narrative:    Spoke to pt and gave permission to speak to dtr's and she will let them assist her with making decision about dc needs.    Patient on Milrinone with wean in process. CIR is following for IP rehab.   Chart reviewed for discharge readiness, patient not medically stable for d/c. Inpatient CM/CSW will continue to monitor pt's advancement through interdisciplinary progression rounds.   If new pt transition needs arise, MD please place a TOC consult.    Contacted dtr, Tracy Baker and left message for return call.   Spoke to pt's dtr Tracy Baker, explained recommendations is for IP rehab. States she wants to speak to her sibling about IP rehab vs SNF rehab. Updated CIR rep, Heron.    Expected Discharge Plan: IP Rehab Facility Barriers to Discharge: Continued Medical Work up    Expected Discharge Plan and Services   Discharge Planning Services: CM Consult Post Acute Care Choice: Skilled Nursing Facility Living arrangements for the past 2 months: Apartment                                       Social Drivers of Health (SDOH) Interventions SDOH Screenings   Food Insecurity: No Food Insecurity (12/29/2023)  Housing: Low Risk  (12/29/2023)  Transportation Needs: No Transportation Needs (12/29/2023)  Utilities: Not At Risk (12/29/2023)  Alcohol Screen: Low Risk  (06/08/2022)  Depression (PHQ2-9): Low Risk  (11/08/2023)  Social Connections: Socially Isolated (12/29/2023)  Tobacco Use: High Risk (01/10/2024)    Readmission Risk Interventions     No data to display

## 2024-01-15 NOTE — Progress Notes (Addendum)
   Inpatient Rehabilitation Admissions Coordinator   I have left dtr, Brandi , a voicemail to clarify caregiver supports and rehab venue preference. Patient undergoing procedure. I will follow up tomorrow.  Heron Leavell, RN, MSN Rehab Admissions Coordinator [phone] 01/15/2024 4:00 PM

## 2024-01-15 NOTE — Progress Notes (Signed)
   01/15/24 0330  Therapy Vitals  Pulse Rate 82  Resp 17  Patient Position (if appropriate) Lying  MEWS Score/Color  MEWS Score 1  MEWS Score Color Green  Respiratory Assessment  Assessment Type Assess only  Respiratory Pattern Regular;Unlabored  Chest Assessment Chest expansion symmetrical  Cough None  Bilateral Breath Sounds Clear;Diminished  Oxygen Therapy/Pulse Ox  O2 Device (S)  HFNC  SpO2 98 %  O2 Therapy Oxygen humidified  O2 Flow Rate (L/min) (S)  8 L/min   Patient found with HHFNC out of nose and tolerating well. Salter placed on patient. RT will continue to wean O2 as patient tolerates.

## 2024-01-15 NOTE — Evaluation (Signed)
 Occupational Therapy Evaluation Patient Details Name: Tracy Baker MRN: [id number] DOB: 1955-04-10 Today's Date: 01/15/2024   History of Present Illness   68 y.o. female presents to [facility] on 12/29/2023 with after leaving AMA from [facility] where she was being treated for C. Albicans candidemia. TEE on 10/28 positive for vegetation of mitral valve. Extraction of multiple teeth on 11/3. Mitral valve repair on 11/6. PMH includes thyroid  disease.     Clinical Impressions Patient admitted for the diagnosis and subsequent procedure.  PTA she was independent with all aspects of ADL,iADL and mobility.  Deficits are listed below.  Patient currently needing up to Max A for lower body ADL and now closer to Min A for sit to stand and step pivot transfers.  She did walk with RN this morning.  OT will follow in the acute setting to address deficits, and Patient will benefit from intensive inpatient follow-up therapy, >3 hours/day.     If plan is discharge home, recommend the following:   A lot of help with bathing/dressing/bathroom;A lot of help with walking and/or transfers;Assist for transportation;Assistance with cooking/housework     Functional Status Assessment   Patient has had a recent decline in their functional status and demonstrates the ability to make significant improvements in function in a reasonable and predictable amount of time.     Equipment Recommendations   Tub/shower seat     Recommendations for Other Services         Precautions/Restrictions   Precautions Precautions: Fall;Other (comment);Sternal Precaution Booklet Issued: No Recall of Precautions/Restrictions: Impaired Precaution/Restrictions Comments: recalled lifting restrictions and understood in the tube Restrictions Weight Bearing Restrictions Per Provider Order: Yes Other Position/Activity Restrictions: sternal precautions     Mobility Bed Mobility Overal bed mobility: Needs Assistance Bed  Mobility: Sit to Sidelying         Sit to sidelying: Mod assist   Patient Response: Cooperative  Transfers Overall transfer level: Needs assistance Equipment used: 1 person hand held assist Transfers: Sit to/from Stand, Bed to chair/wheelchair/BSC Sit to Stand: Min assist     Step pivot transfers: Min assist, Mod assist            Balance Overall balance assessment: Needs assistance Sitting-balance support: No upper extremity supported, Feet supported Sitting balance-Leahy Scale: Fair     Standing balance support: Bilateral upper extremity supported Standing balance-Leahy Scale: Poor                             ADL either performed or assessed with clinical judgement   ADL Overall ADL's : Needs assistance/impaired Eating/Feeding: Set up;Sitting   Grooming: Set up;Sitting   Upper Body Bathing: Minimal assistance;Sitting   Lower Body Bathing: Maximal assistance;Sit to/from stand   Upper Body Dressing : Moderate assistance;Sitting   Lower Body Dressing: Maximal assistance;Sit to/from stand   Toilet Transfer: Minimal assistance;Stand-pivot;BSC/3in1                   Vision Patient Visual Report: No change from baseline       Perception Perception: Not tested       Praxis Praxis: Not tested       Pertinent Vitals/Pain Pain Assessment Pain Assessment: Faces Faces Pain Scale: Hurts little more Pain Location: generalized Pain Descriptors / Indicators: Aching, Sore Pain Intervention(s): Monitored during session     Extremity/Trunk Assessment Upper Extremity Assessment Upper Extremity Assessment: Generalized weakness   Lower Extremity Assessment Lower Extremity Assessment: Defer  to PT evaluation   Cervical / Trunk Assessment Cervical / Trunk Assessment: Normal   Communication Communication Communication: No apparent difficulties   Cognition Arousal: Alert Behavior During Therapy: WFL for tasks assessed/performed Cognition:  No apparent impairments                               Following commands: Intact       Cueing  General Comments   Cueing Techniques: Verbal cues;Gestural cues   VSS on O2   Exercises     Shoulder Instructions      Home Living Family/patient expects to be discharged to:: Private residence Living Arrangements: Alone Available Help at Discharge: Family;Available PRN/intermittently Type of Home: House Home Access: Stairs to enter [employer] of Steps: 5 Entrance Stairs-Rails: Left Home Layout: One level     Bathroom Shower/Tub: Producer, Television/film/video: Standard     Home Equipment: None          Prior Functioning/Environment Prior Level of Function : Independent/Modified Independent;Working/employed;Driving             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 List: Decreased strength;Decreased activity tolerance;Impaired balance (sitting and/or standing);Pain;Decreased knowledge of precautions   OT Treatment/Interventions: Self-care/ADL training;Therapeutic activities;Patient/family education;Balance training;DME and/or AE instruction      OT Goals(Current goals can be found in the care plan section)   Acute Rehab OT Goals Patient Stated Goal: Return home OT Goal Formulation: With patient Time For Goal Achievement: 01/29/24 Potential to Achieve Goals: Good ADL Goals Pt Will Perform Grooming: with supervision;standing Pt Will Perform Upper Body Bathing: with supervision;sitting Pt Will Perform Lower Body Bathing: with min assist;sit to/from stand Pt Will Perform Upper Body Dressing: with supervision;sitting Pt Will Perform Lower Body Dressing: with min assist;sit to/from stand Pt Will Transfer to Toilet: with supervision;ambulating;regular height toilet   OT Frequency:  Min 2X/week    Co-evaluation              AM-PAC OT 6 Clicks Daily Activity     Outcome Measure Help from another person  eating meals?: None Help from another person taking care of personal grooming?: None Help from another person toileting, which includes using toliet, bedpan, or urinal?: A Lot Help from another person bathing (including washing, rinsing, drying)?: A Lot Help from another person to put on and taking off regular upper body clothing?: A Lot Help from another person to put on and taking off regular lower body clothing?: A Lot 6 Click Score: 16   End of Session Equipment Utilized During Treatment: Gait belt Nurse Communication: Mobility status  Activity Tolerance: Patient tolerated treatment well Patient left: in bed;with call bell/phone within reach;with nursing/sitter in room  OT Visit Diagnosis: Unsteadiness on feet (R26.81);Muscle weakness (generalized) (M62.81)                Time: 8799-8779 OT Time Calculation (min): 20 min Charges:  OT General Charges $OT Visit: 1 Visit OT Evaluation $OT Eval Moderate Complexity: 1 Mod  01/15/2024  RP, OTR/L  Acute Rehabilitation Services  Office:  [phone]   Tracy Baker 01/15/2024, 12:43 PM

## 2024-01-15 NOTE — Progress Notes (Signed)
 Patient ID: Tracy Baker, female   DOB: 10-14-1955, 68 y.o.   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   CVP 11. Diuresed yesterday with lasix bolus + diamox. Now off HHFNC, on 4-5L [place]. Sitting up in bed. Denies CP/SOB.   Objective:   Weight Range: 77.6 kg Body mass index is 29.35 kg/m.   Vital Signs:   Temp:  [97.4 F (36.3 C)-98.9 F (37.2 C)] 97.4 F (36.3 C) (11/11 0700) Pulse Rate:  [79-82] 80 (11/11 0700) Resp:  [10-22] 15 (11/11 0700) BP: (100-145)/(57-95) 117/95 (11/11 0700) SpO2:  [92 %-100 %] 100 % (11/11 0700) FiO2 (%):  [30 %-50 %] 30 % (11/11 0151) Weight:  [77.6 kg] 77.6 kg (11/11 0500) Last BM Date : 01/14/24  Weight change: Filed Weights   01/13/24 0500 01/14/24 0500 01/15/24 0500  Weight: 78.4 kg 76.7 kg 77.6 kg    Intake/Output:   Intake/Output Summary (Last 24 hours) at 01/15/2024 0732 Last data filed at 01/15/2024 0540 Gross per 24 hour  Intake 622.93 ml  Output 1450 ml  Net -827.07 ml    Physical Exam  General:  frail appearing. + [place] Neck: JVD ~10 cm.  Cor: Regular rate & rhythm. 1/6 systolic murmur. Lungs: diminished Extremities: trace BLE edema  Neuro: alert & oriented x 3. Affect flat.   Telemetry   Paced 80 (Personally reviewed)    Labs    CBC Recent Labs    01/13/24 0500 01/14/24 0424  WBC 8.7 8.6  HGB 11.0* 10.9*  HCT 32.1* 32.4*  MCV 88.7 90.3  PLT 57* 66*   Basic Metabolic Panel Recent Labs    88/89/74 0424 01/14/24 1430 01/15/24 0317  NA 144 145 143  K 3.1* 3.9 3.3*  CL 108 106 105  CO2 27 24 27   GLUCOSE 146* 163* 118*  BUN 35* 36* 34*  CREATININE 1.22* 1.11* 1.04*  CALCIUM 8.1* 8.4* 8.3*  MG 1.9  --  2.2   Liver Function Tests Recent Labs    01/14/24 0424 01/15/24 0317  AST 398* 298*  ALT 286* 282*  ALKPHOS 180* 206*   BILITOT 2.4* 2.3*  PROT 5.3* 5.3*  ALBUMIN 2.8* 2.7*     Medications:     Scheduled Medications:  sodium chloride    Intravenous Once   acetaminophen  1,000 mg Oral Q6H   Or   acetaminophen (TYLENOL) oral liquid 160 mg/5 mL  1,000 mg Per Tube Q6H   aspirin EC  325 mg Oral Daily   Chlorhexidine Gluconate Cloth  6 each Topical Daily   docusate sodium  200 mg Oral Daily   feeding supplement  237 mL Oral BID BM   insulin aspart  0-15 Units Subcutaneous TID WC   insulin aspart  0-5 Units Subcutaneous QHS   pantoprazole  (PROTONIX ) IV  40 mg Intravenous Daily   potassium chloride  20 mEq Oral Q4H   scopolamine  2 patch Transdermal Q72H   sodium chloride  flush  3 mL Intravenous Q12H    Infusions:  albumin human 999 mL/hr at 01/11/24 1000   fluconazole (DIFLUCAN) IV     milrinone 0.25 mcg/kg/min (01/15/24 0500)    PRN Medications: albumin human, bisacodyl **OR** [DISCONTINUED] bisacodyl, fentaNYL  (SUBLIMAZE ) injection, HYDROmorphone, LORazepam, prochlorperazine, sodium chloride  flush    Patient Profile   Tracy Baker is  a 68 y.o. female with hx of recent nephrolithiasis s/p ureteral stent and lithotripsy at [facility] 10/10 with stent removal.  Admitted with persistent candidemia.   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 Kerrin - [facility] 10/31/5 with no CAD and normal L/R heart pressures.  - Intra-op TEE LVEF 60-65%, RV mildly reduced, small immobile mass on anterior valve leaflet, mod MR, trivial TR - On Milrinone 0.25, epi off. Wean milrinone to 0.125.  - Volume still elevated. CVP 11. Will do 120 mg IV lasix x1 and diamox 500 x1. Follow response. Suspect PO today.  - Start spiro 25 mg daily  Junctional HR - now A-V sequentially paced at 90  Hx of nephrolithiasis  - s/p ureteral stent and lithotripsy   4. ID - On micafungin for candidal bacteremia/MV endocarditis.   5. Elevated LFTs -  Marked jump in LFTs 11/8. Significant improvement today, potentially related to bypass. - Diuresis today  CRITICAL CARE Performed by: Beckey LITTIE Coe   Total critical care time: 14 minutes  Critical care time was exclusive of separately billable procedures and treating other patients.  Critical care was necessary to treat or prevent imminent or life-threatening deterioration.  Critical care was time spent personally by me on the following activities: development of treatment plan with patient and/or surrogate as well as nursing, discussions with consultants, evaluation of patient's response to treatment, examination of patient, obtaining history from patient or surrogate, ordering and performing treatments and interventions, ordering and review of laboratory studies, ordering and review of radiographic studies, pulse oximetry and re-evaluation of patient's condition.

## 2024-01-15 NOTE — Progress Notes (Signed)
 PT Cancellation Note  Patient Details Name: Tracy Baker MRN: [id number] DOB: 08-04-1955   Cancelled Treatment:    Reason Eval/Treat Not Completed: Patient at procedure or test/unavailable  Spoke with RN, pt about to have PICC line placed. Will attempt again tomorrow.  [name], PT, DPT [facility]  Rehabilitation Services Physical Therapist Office: [phone] Website: [email or web address]   [name] 01/15/2024, 3:21 PM

## 2024-01-15 NOTE — Progress Notes (Signed)
 5 Days Post-Op Procedure(s) (LRB): REPAIR, MITRAL VALVE (N/A) ECHOCARDIOGRAM, TRANSESOPHAGEAL, INTRAOPERATIVE (N/A) Subjective: Feels better, eating, no nausea  Objective: Vital signs in last 24 hours: Temp:  [97.4 F (36.3 C)-98.9 F (37.2 C)] 97.4 F (36.3 C) (11/11 0700) Pulse Rate:  [79-82] 80 (11/11 0700) Cardiac Rhythm: Atrial paced (11/11 0000) Resp:  [10-22] 15 (11/11 0700) BP: (100-145)/(57-95) 117/95 (11/11 0700) SpO2:  [92 %-100 %] 100 % (11/11 0700) FiO2 (%):  [30 %-50 %] 30 % (11/11 0151) Weight:  [77.6 kg] 77.6 kg (11/11 0500)  Hemodynamic parameters for last 24 hours: CVP:  [4 mmHg-17 mmHg] 14 mmHg PCWP:  [10 mmHg] 10 mmHg  Intake/Output from previous day: 11/10 0701 - 11/11 0700 In: 622.9 [P.O.:120; I.V.:137; IV Piggyback:365.9] Out: 1450 [Urine:1450] Intake/Output this shift: No intake/output data recorded.  General appearance: alert, cooperative, and no distress Neurologic: intact Heart: regular rate and rhythm Lungs: diminished breath sounds bibasilar Abdomen: normal findings: soft, non-tender  Lab Results: Recent Labs    01/13/24 0500 01/14/24 0424  WBC 8.7 8.6  HGB 11.0* 10.9*  HCT 32.1* 32.4*  PLT 57* 66*   BMET:  Recent Labs    01/14/24 1430 01/15/24 0317  NA 145 143  K 3.9 3.3*  CL 106 105  CO2 24 27  GLUCOSE 163* 118*  BUN 36* 34*  CREATININE 1.11* 1.04*  CALCIUM 8.4* 8.3*    PT/INR: No results for input(s): LABPROT, INR in the last 72 hours. ABG    Component Value Date/Time   PHART 7.387 01/11/2024 0856   HCO3 19.3 (L) 01/11/2024 0856   TCO2 20 (L) 01/11/2024 0856   ACIDBASEDEF 5.0 (H) 01/11/2024 0856   O2SAT 40.1 01/15/2024 0459   CBG (last 3)  Recent Labs    01/14/24 1628 01/14/24 2132 01/15/24 0719  GLUCAP 129* 114* 138*    Assessment/Plan: S/P Procedure(s) (LRB): REPAIR, MITRAL VALVE (N/A) ECHOCARDIOGRAM, TRANSESOPHAGEAL, INTRAOPERATIVE (N/A) POD 5 NEURO- intact CV- in junctional rhythm in 30-40  range, paced at 80  Not on beta blocker, no annuloplasty ring at surgery   Co-ox 40 on milrinone, but all other parameters improved ID- on micafungin RESP- Significant improvement in O2 demand  Effusions resolving, still vascular congestion RENAL- creatinine continues to improve  Hypokalemia- supplement  Continue diuresis ENDO- CBG well controlled GI- nausea improved Anemia/ thrombocytopenia- recheck CBC tomorrow Deconditioning- mobilize   LOS: 13 days    Tracy Baker 01/15/2024

## 2024-01-15 NOTE — Progress Notes (Signed)
 Peripherally Inserted Central Catheter Placement  The IV Nurse has discussed with the patient and/or persons authorized to consent for the patient, the purpose of this procedure and the potential benefits and risks involved with this procedure.  The benefits include less needle sticks, lab draws from the catheter, and the patient may be discharged home with the catheter. Risks include, but not limited to, infection, bleeding, blood clot (thrombus formation), and puncture of an artery; nerve damage and irregular heartbeat and possibility to perform a PICC exchange if needed/ordered by physician.  Alternatives to this procedure were also discussed.  Bard Power PICC patient education guide, fact sheet on infection prevention and patient information card has been provided to patient /or left at bedside.    PICC Placement Documentation  PICC Double Lumen 01/15/24 Right Basilic 35 cm 0 cm (Active)  Indication for Insertion or Continuance of Line Chronic illness with exacerbations (CF, Sickle Cell, etc.) 01/15/24 1615  Exposed Catheter (cm) 0 cm 01/15/24 1615  Site Assessment Clean, Dry, Intact 01/15/24 1615  Lumen #1 Status Flushed;Saline locked;Blood return noted 01/15/24 1615  Lumen #2 Status Flushed;Saline locked;Blood return noted 01/15/24 1615  Dressing Type Transparent;Securing device 01/15/24 1615  Dressing Status Antimicrobial disc/dressing in place;Clean, Dry, Intact 01/15/24 1615  Line Care Connections checked and tightened 01/15/24 1615  Line Adjustment (NICU/IV Team Only) No 01/15/24 1615  Dressing Intervention New dressing;Adhesive placed at insertion site (IV team only) 01/15/24 1615  Dressing Change Due 01/22/24 01/15/24 1615       Hobert Benders 01/15/2024, 4:17 PM

## 2024-01-15 NOTE — Progress Notes (Signed)
 TCTS PM Rounding Progress Note  S/p PICC placement today.  Walked to the door today which is the farthest she's gone.  Diuresing well, still paced at 80.  Vitals:   01/15/24 1430 01/15/24 1500  BP:    Pulse: 79   Resp: 17   Temp:  (!) 97.3 F (36.3 C)  SpO2: 99%     Plan: - Continue current care.  Con Clunes, MD Cardiothoracic Surgery Pager: [phone]

## 2024-01-15 NOTE — Plan of Care (Signed)
 Problem: Education: Goal: Knowledge of General Education information will improve Description: Including pain rating scale, medication(s)/side effects and non-pharmacologic comfort measures Outcome: Progressing   Problem: Health Behavior/Discharge Planning: Goal: Ability to manage health-related needs will improve Outcome: Progressing   Problem: Clinical Measurements: Goal: Ability to maintain clinical measurements within normal limits will improve Outcome: Progressing Goal: Will remain free from infection Outcome: Progressing Goal: Diagnostic test results will improve Outcome: Progressing Goal: Respiratory complications will improve Outcome: Progressing Goal: Cardiovascular complication will be avoided Outcome: Progressing   Problem: Activity: Goal: Risk for activity intolerance will decrease Outcome: Progressing   Problem: Nutrition: Goal: Adequate nutrition will be maintained Outcome: Progressing   Problem: Coping: Goal: Level of anxiety will decrease Outcome: Progressing   Problem: Elimination: Goal: Will not experience complications related to bowel motility Outcome: Progressing Goal: Will not experience complications related to urinary retention Outcome: Progressing   Problem: Pain Managment: Goal: General experience of comfort will improve and/or be controlled Outcome: Progressing   Problem: Safety: Goal: Ability to remain free from injury will improve Outcome: Progressing   Problem: Skin Integrity: Goal: Risk for impaired skin integrity will decrease Outcome: Progressing   Problem: Education: Goal: Understanding of CV disease, CV risk reduction, and recovery process will improve Outcome: Progressing Goal: Individualized Educational Video(s) Outcome: Progressing   Problem: Activity: Goal: Ability to return to baseline activity level will improve Outcome: Progressing   Problem: Cardiovascular: Goal: Ability to achieve and maintain adequate  cardiovascular perfusion will improve Outcome: Progressing Goal: Vascular access site(s) Level 0-1 will be maintained Outcome: Progressing   Problem: Health Behavior/Discharge Planning: Goal: Ability to safely manage health-related needs after discharge will improve Outcome: Progressing   Problem: Education: Goal: Will demonstrate proper wound care and an understanding of methods to prevent future damage Outcome: Progressing Goal: Knowledge of disease or condition will improve Outcome: Progressing Goal: Knowledge of the prescribed therapeutic regimen will improve Outcome: Progressing Goal: Individualized Educational Video(s) Outcome: Progressing   Problem: Activity: Goal: Risk for activity intolerance will decrease Outcome: Progressing   Problem: Cardiac: Goal: Will achieve and/or maintain hemodynamic stability Outcome: Progressing   Problem: Clinical Measurements: Goal: Postoperative complications will be avoided or minimized Outcome: Progressing   Problem: Respiratory: Goal: Respiratory status will improve Outcome: Progressing   Problem: Skin Integrity: Goal: Wound healing without signs and symptoms of infection Outcome: Progressing Goal: Risk for impaired skin integrity will decrease Outcome: Progressing   Problem: Urinary Elimination: Goal: Ability to achieve and maintain adequate renal perfusion and functioning will improve Outcome: Progressing   Problem: Education: Goal: Ability to describe self-care measures that may prevent or decrease complications (Diabetes Survival Skills Education) will improve Outcome: Progressing Goal: Individualized Educational Video(s) Outcome: Progressing   Problem: Coping: Goal: Ability to adjust to condition or change in health will improve Outcome: Progressing   Problem: Fluid Volume: Goal: Ability to maintain a balanced intake and output will improve Outcome: Progressing   Problem: Health Behavior/Discharge  Planning: Goal: Ability to identify and utilize available resources and services will improve Outcome: Progressing Goal: Ability to manage health-related needs will improve Outcome: Progressing   Problem: Metabolic: Goal: Ability to maintain appropriate glucose levels will improve Outcome: Progressing   Problem: Nutritional: Goal: Maintenance of adequate nutrition will improve Outcome: Progressing Goal: Progress toward achieving an optimal weight will improve Outcome: Progressing   Problem: Skin Integrity: Goal: Risk for impaired skin integrity will decrease Outcome: Progressing   Problem: Tissue Perfusion: Goal: Adequacy of tissue perfusion will improve Outcome: Progressing

## 2024-01-16 ENCOUNTER — Inpatient Hospital Stay (HOSPITAL_COMMUNITY)

## 2024-01-16 DIAGNOSIS — B377 Candidal sepsis: Secondary | ICD-10-CM | POA: Diagnosis not present

## 2024-01-16 LAB — MAGNESIUM: Magnesium: 2 mg/dL (ref 1.7–2.4)

## 2024-01-16 LAB — CBC
HCT: 31 % — ABNORMAL LOW (ref 36.0–46.0)
Hemoglobin: 10.2 g/dL — ABNORMAL LOW (ref 12.0–15.0)
MCH: 30.8 pg (ref 26.0–34.0)
MCHC: 32.9 g/dL (ref 30.0–36.0)
MCV: 93.7 fL (ref 80.0–100.0)
Platelets: 61 K/uL — ABNORMAL LOW (ref 150–400)
RBC: 3.31 MIL/uL — ABNORMAL LOW (ref 3.87–5.11)
RDW: 16.8 % — ABNORMAL HIGH (ref 11.5–15.5)
WBC: 8.6 K/uL (ref 4.0–10.5)
nRBC: 0.8 % — ABNORMAL HIGH (ref 0.0–0.2)

## 2024-01-16 LAB — COMPREHENSIVE METABOLIC PANEL WITH GFR
ALT: 275 U/L — ABNORMAL HIGH (ref 0–44)
AST: 236 U/L — ABNORMAL HIGH (ref 15–41)
Albumin: 2.7 g/dL — ABNORMAL LOW (ref 3.5–5.0)
Alkaline Phosphatase: 182 U/L — ABNORMAL HIGH (ref 38–126)
Anion gap: 13 (ref 5–15)
BUN: 29 mg/dL — ABNORMAL HIGH (ref 8–23)
CO2: 24 mmol/L (ref 22–32)
Calcium: 8.2 mg/dL — ABNORMAL LOW (ref 8.9–10.3)
Chloride: 106 mmol/L (ref 98–111)
Creatinine, Ser: 0.98 mg/dL (ref 0.44–1.00)
GFR, Estimated: >60 mL/min (ref >60)
Glucose, Bld: 110 mg/dL — ABNORMAL HIGH (ref 70–99)
Potassium: 3.7 mmol/L (ref 3.5–5.1)
Sodium: 143 mmol/L (ref 135–145)
Total Bilirubin: 1.8 mg/dL — ABNORMAL HIGH (ref 0.0–1.2)
Total Protein: 5.4 g/dL — ABNORMAL LOW (ref 6.5–8.1)

## 2024-01-16 LAB — COOXEMETRY PANEL
Carboxyhemoglobin: 1.6 % — ABNORMAL HIGH (ref 0.5–1.5)
Methemoglobin: <0.7 % (ref 0.0–1.5)
O2 Saturation: 69.3 %
Total hemoglobin: 10.4 g/dL — ABNORMAL LOW (ref 12.0–16.0)

## 2024-01-16 LAB — GLUCOSE, CAPILLARY
Glucose-Capillary: 116 mg/dL — ABNORMAL HIGH (ref 70–99)
Glucose-Capillary: 127 mg/dL — ABNORMAL HIGH (ref 70–99)
Glucose-Capillary: 138 mg/dL — ABNORMAL HIGH (ref 70–99)
Glucose-Capillary: 104 mg/dL — ABNORMAL HIGH (ref 70–99)

## 2024-01-16 MED ORDER — FUROSEMIDE 40 MG PO TABS
60.0000 mg | ORAL_TABLET | Freq: Every day | ORAL | Status: DC
  Administered 2024-01-16: 60 mg via ORAL
  Filled 2024-01-16: qty 1

## 2024-01-16 MED ORDER — POTASSIUM CHLORIDE 20 MEQ PO PACK
40.0000 meq | PACK | Freq: Once | ORAL | Status: DC
  Filled 2024-01-16: qty 2

## 2024-01-16 MED ORDER — POTASSIUM CHLORIDE 10 MEQ/50ML IV SOLN
10.0000 meq | INTRAVENOUS | Status: AC
  Administered 2024-01-16 (×3): 10 meq via INTRAVENOUS
  Filled 2024-01-16 (×3): qty 50

## 2024-01-16 MED ORDER — POTASSIUM CHLORIDE CRYS ER 20 MEQ PO TBCR: 40.0000 meq | EXTENDED_RELEASE_TABLET | Freq: Once | ORAL | Status: DC

## 2024-01-16 MED ORDER — POTASSIUM CHLORIDE 10 MEQ/100ML IV SOLN
10.0000 meq | INTRAVENOUS | Status: DC
Start: 2024-01-16 — End: 2024-01-16

## 2024-01-16 NOTE — Progress Notes (Addendum)
 Patient ID: Tracy Baker, female   DOB: [birth year], 68 y.o.   MRN: [id number]     Advanced Heart Failure Rounding Note  Cardiologist: Lonni Cash, MD  Chief Complaint: S/p MV Repair / MV endocarditis  Subjective:    POD #6 s/p MV repair for candidal MV endocarditis.   CVP 5/6. Diuresed yesterday with lasix bolus + diamox. Sitting up in chair. Denies CP/SOB.   Objective:   Weight Range: 76.5 kg Body mass index is 28.95 kg/m.   Vital Signs:   Temp:  [97 F (36.1 C)-97.9 F (36.6 C)] 97.9 F (36.6 C) (11/12 0342) Pulse Rate:  [79-82] 80 (11/12 0815) Resp:  [10-31] 14 (11/12 0815) BP: (97-131)/(55-87) 117/72 (11/12 0800) SpO2:  [96 %-100 %] 98 % (11/12 0815) Weight:  [76.5 kg] 76.5 kg (11/12 0512) Last BM Date : 01/15/24  Weight change: Filed Weights   01/14/24 0500 01/15/24 0500 01/16/24 0512  Weight: 76.7 kg 77.6 kg 76.5 kg    Intake/Output:   Intake/Output Summary (Last 24 hours) at 01/16/2024 0821 Last data filed at 01/16/2024 0750 Gross per 24 hour  Intake 1060.55 ml  Output 1961 ml  Net -900.45 ml    Physical Exam  General:  Elderly appearing. + [place]: JVD ~6 cm.  Cor: Regular rate & rhythm. 1/6 systolic murmur. Lungs: diminished Extremities: No edema  Neuro: alert & oriented x 3. Affect flat.   Telemetry   Paced 80 (Personally reviewed)    Labs    CBC Recent Labs    01/15/24 0810 01/16/24 0436  WBC 9.1 8.6  HGB 10.3* 10.2*  HCT 31.0* 31.0*  MCV 92.0 93.7  PLT 62* 61*   Basic Metabolic Panel Recent Labs    88/88/74 0317 01/15/24 1814 01/16/24 0436  NA 143 140 143  K 3.3* 3.4* 3.7  CL 105 104 106  CO2 27 25 24   GLUCOSE 118* 159* 110*  BUN 34* 31* 29*  CREATININE 1.04* 1.02* 0.98  CALCIUM 8.3* 8.3* 8.2*  MG 2.2  --  2.0   Liver Function Tests Recent Labs    01/15/24 0317 01/16/24 0436  AST 298* 236*  ALT 282* 275*  ALKPHOS 206* 182*  BILITOT 2.3* 1.8*  PROT 5.3* 5.4*  ALBUMIN 2.7* 2.7*   Medications:    Scheduled Medications:  aspirin EC  325 mg Oral Daily   Chlorhexidine Gluconate Cloth  6 each Topical Daily   docusate sodium  200 mg Oral Daily   feeding supplement  237 mL Oral BID BM   insulin aspart  0-15 Units Subcutaneous TID WC   insulin aspart  0-5 Units Subcutaneous QHS   pantoprazole  (PROTONIX ) IV  40 mg Intravenous Daily   potassium chloride  40 mEq Oral Once   scopolamine  2 patch Transdermal Q72H   sodium chloride  flush  10-40 mL Intracatheter Q12H   sodium chloride  flush  3 mL Intravenous Q12H   spironolactone  25 mg Oral Daily    Infusions:  albumin human 999 mL/hr at 01/11/24 1000   fluconazole (DIFLUCAN) IV Stopped (01/15/24 1152)   milrinone 0.125 mcg/kg/min (01/16/24 0700)    PRN Medications: albumin human, bisacodyl **OR** [DISCONTINUED] bisacodyl, fentaNYL  (SUBLIMAZE ) injection, HYDROmorphone, LORazepam, prochlorperazine, sodium chloride  flush, sodium chloride  flush  Patient Profile   Tracy Baker is a 68 y.o. female with hx of recent nephrolithiasis s/p ureteral stent and lithotripsy at [facility] 10/10 with stent removal.  Admitted with persistent candidemia.   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 Kerrin - [facility] 10/31/5 with no CAD and normal L/R heart pressures.  - Intra-op TEE LVEF 60-65%, RV mildly reduced, small immobile mass on anterior valve leaflet, mod MR, trivial TR - On Milrinone 0.125, co-ox 69%. Stop milrinone today  - Volume much better today. CVP 5/6. Transition to PO diuretics. Start lasix 60 mg daily - Continue spiro 25 mg daily  Junctional HR - now A-V sequentially paced at 90 - May need to reach out to EP - Underlying rhythm brady today  Hx of nephrolithiasis  - s/p ureteral stent and lithotripsy   4. ID - On fluconazole for candidal bacteremia/MV endocarditis. Plan for fluconazole PO at discharge  5. Elevated LFTs - Marked jump in LFTs 11/8.  Significant improvement, potentially related to bypass.  Remove foley and introducer today. Stop milrinone as above.   CRITICAL CARE Performed by: Beckey LITTIE Coe   Total critical care time: 14 minutes  Critical care time was exclusive of separately billable procedures and treating other patients.  Critical care was necessary to treat or prevent imminent or life-threatening deterioration.  Critical care was time spent personally by me on the following activities: development of treatment plan with patient and/or surrogate as well as nursing, discussions with consultants, evaluation of patient's response to treatment, examination of patient, obtaining history from patient or surrogate, ordering and performing treatments and interventions, ordering and review of laboratory studies, ordering and review of radiographic studies, pulse oximetry and re-evaluation of patient's condition.   Patient seen with NP, I formulated the plan and agree with the above note.   CVP 6 today, co-ox 69% on milrinone 0.125.    No complaints, has walked.   General: NAD Neck: No JVD, no thyromegaly or thyroid  nodule.  Lungs: Clear to auscultation bilaterally with normal respiratory effort. CV: Nondisplaced PMI.  Heart regular S1/S2, no S3/S4, no murmur.  No peripheral edema.   Abdomen: Soft, nontender, no hepatosplenomegaly, no distention.  Skin: Intact without lesions or rashes.  Neurologic: Alert and oriented x 3.  Psych: Normal affect. Extremities: No clubbing or cyanosis.  HEENT: Normal.   Stop milrinone today.  Echo from 11/8 reviewed, EF 65-70% with mild RV dysfunction, s/p MV repair with mild MR and mean gradient 4 mmHg.  With CVP 6, start Lasix 60 mg po daily (stop IV).   I turned down her pacing rate to 30 bpm.  She appears to have a stable underlying sinus rhythm in 50s, SBP > 100 with this change.  Will leave at back-up pacing 30 bpm for now.   CRITICAL CARE Performed by: Ezra Shuck  Total  critical care time: 35 minutes  Critical care time was exclusive of separately billable procedures and treating other patients.  Critical care was necessary to treat or prevent imminent or life-threatening deterioration.  Critical care was time spent personally by me on the following activities: development of treatment plan with patient and/or surrogate as well as nursing, discussions with consultants, evaluation of patient's response to treatment, examination of patient, obtaining history from patient or surrogate, ordering and performing treatments and interventions, ordering and review of laboratory studies, ordering and review of radiographic studies, pulse oximetry and re-evaluation of patient's condition.  Ezra Shuck 01/16/2024 9:44 AM

## 2024-01-16 NOTE — Plan of Care (Signed)
   Problem: Education: Goal: Knowledge of General Education information will improve Description: Including pain rating scale, medication(s)/side effects and non-pharmacologic comfort measures Outcome: Progressing   Problem: Clinical Measurements: Goal: Respiratory complications will improve Outcome: Progressing   Problem: Activity: Goal: Risk for activity intolerance will decrease Outcome: Progressing

## 2024-01-16 NOTE — Progress Notes (Signed)
 TCTS Evening Rounds:  Hemodynamically stable in sinus brady rhythm 55 with 1st degree AV block. Milrinone is off.   Sats 96% 2L.  UO ok Had BM. Ambulated a little.

## 2024-01-16 NOTE — Progress Notes (Signed)
 Physical Therapy Treatment Patient Details Name: Tracy Baker MRN: [id number] DOB: Jul 09, 1955 Today's Date: 01/16/2024   History of Present Illness 68 y.o. female presents to [facility] hospital on 12/29/2023 with after leaving AMA from [facility] where she was being treated for C. Albicans candidemia. TEE on 10/28 positive for vegetation of mitral valve. Extraction of multiple teeth on 11/3. Mitral valve repair on 11/6. PMH includes thyroid  disease.    PT Comments  Pt is slowly progressing towards goals. Min A for bed mobility, sit to stand and CGA for short non-functional distance gait with dyspnea on 4L o2 via [place]. Pt requires chair follow due to fatigue. Pt family present and supportive. Requires cues for sternal precautions throughout. Due to pt current functional status, home set up and available assistance at home recommending skilled physical therapy services > 3 hours/day in order to address strength, balance and functional mobility to decrease risk for falls, injury, immobility, skin break down and re-hospitalization.      If plan is discharge home, recommend the following: A lot of help with bathing/dressing/bathroom;Assistance with cooking/housework;Assist for transportation;Help with stairs or ramp for entrance;A little help with walking and/or transfers     Equipment Recommendations  Rolling walker (2 wheels);BSC/3in1       Precautions / Restrictions Precautions Precautions: Fall;Other (comment);Sternal Precaution Booklet Issued: No Recall of Precautions/Restrictions: Impaired Precaution/Restrictions Comments: recalled lifting restrictions and understood in the tube Restrictions Weight Bearing Restrictions Per Provider Order: Yes RUE Weight Bearing Per Provider Order: Partial weight bearing RUE Partial Weight Bearing Percentage or Pounds: 5 LUE Weight Bearing Per Provider Order: Partial weight bearing LUE Partial Weight Bearing Percentage or Pounds: 5 Other Position/Activity  Restrictions: sternal precautions     Mobility  Bed Mobility Overal bed mobility: Needs Assistance Bed Mobility: Sit to Sidelying Rolling: Min assist       Sit to sidelying: Min assist General bed mobility comments: Min A with cues for manitaining sternal precautions and Min A to get trunk to midline    Transfers Overall transfer level: Needs assistance Equipment used: Rolling walker (2 wheels) Transfers: Sit to/from Stand Sit to Stand: Contact guard assist, From elevated surface, Min assist           General transfer comment: CGA for safety from EOB Min A and then from recliner for 2x at [facility] with verbal cues for sequencing/body mechanics to decrease level of assist while maintaining sternal precautions    Ambulation/Gait Ambulation/Gait assistance: Contact guard assist, +2 safety/equipment Gait Distance (Feet): 40 Feet Assistive device: Rolling walker (2 wheels) Gait Pattern/deviations: Step-through pattern, Decreased stride length, Decreased step length - right, Decreased step length - left, Drifts right/left, Narrow base of support Gait velocity: decreased Gait velocity interpretation: <1.31 ft/sec, indicative of household ambulator   General Gait Details: Drifting L, very short step bil, pt with dyspnea after ambulating 25 ft, short seated rest break; ambulated 15 more feet. O2 4L O2 sats in 90s; poor pleth line while ambulating.      Balance Overall balance assessment: Needs assistance Sitting-balance support: No upper extremity supported, Feet supported Sitting balance-Leahy Scale: Fair     Standing balance support: Bilateral upper extremity supported, During functional activity Standing balance-Leahy Scale: Poor      Communication Communication Communication: No apparent difficulties Factors Affecting Communication: Reduced clarity of speech  Cognition Arousal: Alert Behavior During Therapy: WFL for tasks assessed/performed   PT - Cognitive impairments:  Problem solving, Safety/Judgement Difficult to assess due to: Impaired communication  PT - Cognition Comments: hypophonic with verbalizations Following commands: Intact Following commands impaired: Follows one step commands with increased time, Only follows one step commands consistently    Cueing Cueing Techniques: Verbal cues, Gestural cues     General Comments General comments (skin integrity, edema, etc.): Pt on 4L during ambulation with O2 sats in 90's with good pleth line. HR slightly elevated in 70's but WNL      Pertinent Vitals/Pain Pain Assessment Pain Assessment: 0-10 Pain Score: 2  Pain Location: sternum Pain Descriptors / Indicators: Aching, Sore Pain Intervention(s): Monitored during session    Home Living     Available Help at Discharge: Family;Available 24 hours/day (dtr, [name] Jarvis, and son along with Aunt from [place] can provide 24/7)         PT Goals (current goals can now be found in the care plan section) Acute Rehab PT Goals Patient Stated Goal: to return to work PT Goal Formulation: With patient Time For Goal Achievement: 01/27/24 Potential to Achieve Goals: Fair Progress towards PT goals: Progressing toward goals    Frequency    Min 2X/week      PT Plan  Continue with current POC     AM-PAC PT 6 Clicks Mobility   Outcome Measure  Help needed turning from your back to your side while in a flat bed without using bedrails?: A Little Help needed moving from lying on your back to sitting on the side of a flat bed without using bedrails?: A Little Help needed moving to and from a bed to a chair (including a wheelchair)?: A Little Help needed standing up from a chair using your arms (e.g., wheelchair or bedside chair)?: A Little Help needed to walk in hospital room?: Total Help needed climbing 3-5 steps with a railing? : Total 6 Click Score: 14    End of Session Equipment Utilized During Treatment: Oxygen;Gait belt Activity  Tolerance: Patient tolerated treatment well Patient left: with call bell/phone within reach;with nursing/sitter in room;in chair;with chair alarm set;with family/visitor present Nurse Communication: Mobility status PT Visit Diagnosis: Other abnormalities of gait and mobility (R26.89);Muscle weakness (generalized) (M62.81)     Time: 8641-8577 PT Time Calculation (min) (ACUTE ONLY): 24 min  Charges:    $Gait Training: 8-22 mins $Therapeutic Activity: 8-22 mins PT General Charges $$ ACUTE PT VISIT: 1 Visit                     Dorothyann Maier, DPT, CLT  Acute Rehabilitation Services Office: [phone] (Secure chat preferred)    Dorothyann VEAR Maier 01/16/2024, 4:34 PM

## 2024-01-16 NOTE — Progress Notes (Signed)
 6 Days Post-Op Procedure(s) (LRB): REPAIR, MITRAL VALVE (N/A) ECHOCARDIOGRAM, TRANSESOPHAGEAL, INTRAOPERATIVE (N/A) Subjective: Feels a little better, denies pain, nausea, appetite still marginal  Objective: Vital signs in last 24 hours: Temp:  [97 F (36.1 C)-97.9 F (36.6 C)] 97.9 F (36.6 C) (11/12 0342) Pulse Rate:  [79-82] 80 (11/12 0715) Cardiac Rhythm: Atrial paced (11/12 0418) Resp:  [10-31] 18 (11/12 0715) BP: (97-131)/(55-87) 119/77 (11/12 0700) SpO2:  [96 %-100 %] 98 % (11/12 0715) Weight:  [76.5 kg] 76.5 kg (11/12 0512)  Hemodynamic parameters for last 24 hours: CVP:  [4 mmHg-12 mmHg] 4 mmHg  Intake/Output from previous day: 11/11 0701 - 11/12 0700 In: 1066.5 [P.O.:480; I.V.:88.5; IV Piggyback:498] Out: 1986 [Urine:1985; Stool:1] Intake/Output this shift: No intake/output data recorded.  General appearance: alert, cooperative, and no distress Neurologic: intact Heart: regular rate and rhythm Lungs: diminished breath sounds bibasilar Abdomen: normal findings: soft, non-tender Wound: clean and intact  Lab Results: Recent Labs    01/15/24 0810 01/16/24 0436  WBC 9.1 8.6  HGB 10.3* 10.2*  HCT 31.0* 31.0*  PLT 62* 61*   BMET:  Recent Labs    01/15/24 1814 01/16/24 0436  NA 140 143  K 3.4* 3.7  CL 104 106  CO2 25 24  GLUCOSE 159* 110*  BUN 31* 29*  CREATININE 1.02* 0.98  CALCIUM 8.3* 8.2*    PT/INR: No results for input(s): LABPROT, INR in the last 72 hours. ABG    Component Value Date/Time   PHART 7.387 01/11/2024 0856   HCO3 19.3 (L) 01/11/2024 0856   TCO2 20 (L) 01/11/2024 0856   ACIDBASEDEF 5.0 (H) 01/11/2024 0856   O2SAT 69.3 01/16/2024 0436   CBG (last 3)  Recent Labs    01/15/24 1824 01/15/24 2128 01/16/24 0607  GLUCAP 136* 139* 116*    Assessment/Plan: S/P Procedure(s) (LRB): REPAIR, MITRAL VALVE (N/A) ECHOCARDIOGRAM, TRANSESOPHAGEAL, INTRAOPERATIVE (N/A) POD # 6 NEURO_ intract CV- in sinus brady at 50, A paced at  80  Co-ox 69 on milrinone 0.125  Hopefully can wean off milrinone today  Dc central line, use PICC  CVP down to 6 ID- on micafungin RESP_ continue IS, diuresis  Repeat CXR tomorrow RENAL- diuresed with Diamox and Lasix yesterday  Continue diuresis ENDO- CBG well controlled GI- appetite fair, hopefully Po intake will improve  Mild protein calorie malnutrition- Ensure + supplements Thrombocytopenia- PLT stable at about 60K SCD, no enoxaparin due to thrombocytopenia Deconditioning- severe   LOS: 14 days    Tracy Baker 01/16/2024

## 2024-01-16 NOTE — Progress Notes (Addendum)
  Inpatient Rehabilitation Admissions Coordinator   I met with patient at bedside. I discussed the need for some type of assistance/supervision after any rehab once home. She states her landlord who lives next to her has offered assistance, her sister in [place], may come down to assist. I left dtr, Brandi a voicemail yesterday and await family discussions further to determine if to pursue Cir admit vs SNF.  Heron Leavell, RN, MSN Rehab Admissions Coordinator [phone] 01/16/2024 11:12 AM   I met with pt's daughter, [name] and son at bedside. They willa arrange 24/7 assist after a CIR stay and prefer Cir, not SNF. I await further medical progress before beginning Auth.  Heron Leavell, RN, MSN Rehab Admissions Coordinator [phone] 01/16/2024 2:42 PM

## 2024-01-16 NOTE — PMR Pre-admission (Shared)
 PMR Admission Coordinator Pre-Admission Assessment  Patient: Tracy Baker is an 68 y.o., female MRN: [id number] DOB: 08/25/1955 Height: 5' 4 (162.6 cm) Weight: 61.2 kg  Insurance Information HMO:     PPO: yes     PCP:      IPA:      80/20:      OTHER:  PRIMARY: U[employer] Medicare      Policy#: [id number]   Medicare: [id number]   Subscriber: pt CM Name: ***      Phone#: ***     Fax#: [phone] Pre-Cert#: [id number]  *** auth for CIR from *** with *** for admit *** with next review date ***.  Updates due to *** at fax listed above.      Employer: *** Benefits:  Phone #: Administrator, [email or web address]/ [phone]     Name:  Eff. Date: 09/04/23- still active     Deduct: Does not have      Out of Pocket Max: $3,800 ( $2,475 met)  CIR: $355/day for days 1-5, $0 co pay for days 6+       SNF: Full 20 days Outpatient: $20 copay/visit    Home Health: 100 % coverage    DME: 80% coverage Providers: patient choice  SECONDARY: none      Policy#:      Phone#:   Financial Counselor:       Phone#:   The Engineer, [employer] Information Summary" for patients in Inpatient Rehabilitation Facilities with attached "Privacy Act Statement-Health Care Records" was provided and verbally reviewed with: Patient and Family  Emergency Contact Information Contact Information     Name Relation Home Work Mobile   Baker,Tracy Daughter [phone]  [phone]   Tracy, Baker   [phone]   Baker,Tracy Daughter   [phone]      Other Contacts   None on File    Current Medical History  Patient Admitting Diagnosis: Debility after MVR  History of Present Illness: 68 yo female with recent history of nephrolithiasis. Presented on 12/28/23 by private vehicle from [facility] in [facility] where she was being treated for C. Albicans candidemia. Recent ureteral stent placement and lithotripsy at [facility] 12/14/23 and then stent removal 10/14. Developed fevers, chills and Malaise. She went to [place] for a wedding,  where she got worse and presented to [facility] 12/22/23.   At [facility] CT showed stranding around the right kidney and ureter. Urology consulted and placed on Rocephin . Urine and blood cultures showed candida albicans and ID consulted. Treated with fluconazole  and TTE normal. On hospital day 7 patient requested to leave AMA and was driven to [facility] for medical care for attempt at direct transfer had been unsuccessful.  TTE on 10/28 showed vegetation on the mitral valve. 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 ID recommends micafungin  for 6 weeks total. Plan for po fluconazole  at discharge. Heart failure team with close follow up with aggressive diuresis. Postoperative junctional HR with underlying brady rhythm.   She is going to need eventual redo mitral valve surgery for mitral valve replacement, timing is uncertain. Discussed with Dr. Kerrin, would like more time to recover from her recent MV repair (PT, nutrition). From HF standpoint, she could go to progressive unit, perhaps with a stay in CIR prior to surgery if she remains stable. OR to be 3 weeks after return home. Remains stable despite residual MR. Holding diuretics for 2 days and can resume torsemide  20 daily .    *** Medical updates  Patient's  medical record from [facility] has been reviewed by the rehabilitation admission coordinator and physician.  Past Medical History  Past Medical History:  Diagnosis Date   Thyroid  disease    Has the patient had major surgery during 100 days prior to admission? Yes  Family History   family history includes Diabetes in her father; Hypertension in her mother.  Current Medications  Current Facility-Administered Medications:    amiodarone  (PACERONE ) tablet 200 mg, 200 mg, Oral, BID, Hendrickson, Steven C, MD, 200 mg at 02/01/24 1102   apixaban  (ELIQUIS ) tablet 5 mg, 5 mg, Oral, BID, Hendrickson, Steven C, MD, 5 mg at 02/01/24 1102    bisacodyl  (DULCOLAX) EC tablet 10 mg, 10 mg, Oral, Daily PRN **OR** [DISCONTINUED] bisacodyl  (DULCOLAX) suppository 10 mg, 10 mg, Rectal, Daily, Raguel, Bailey S, PA-C   Chlorhexidine  Gluconate Cloth 2 % PADS 6 each, 6 each, Topical, Daily, Hendrickson, Steven C, MD, 6 each at 02/01/24 1007   clonazePAM  (KLONOPIN ) tablet 0.5 mg, 0.5 mg, Oral, TID PRN, Hendrickson, Steven C, MD, 0.5 mg at 01/22/24 1714   digoxin  (LANOXIN ) tablet 0.0625 mg, 0.0625 mg, Oral, QODAY, Colletta Manuelita Garre, PA-C, 0.0625 mg at 01/31/24 0941   docusate sodium  (COLACE) capsule 200 mg, 200 mg, Oral, Daily, Kerrin Elspeth BROCKS, MD, 200 mg at 01/31/24 0941   feeding supplement (ENSURE PLUS HIGH PROTEIN) liquid 237 mL, 237 mL, Oral, BID BM, Kerrin Elspeth BROCKS, MD, 237 mL at 01/31/24 9056   fluconazole  (DIFLUCAN ) IVPB 400 mg, 400 mg, Intravenous, Q24H, Kerrin Elspeth BROCKS, MD, Last Rate: 100 mL/hr at 02/01/24 1113, 400 mg at 02/01/24 1113   hydrALAZINE  (APRESOLINE ) tablet 20 mg, 20 mg, Oral, TID, Hendrickson, Steven C, MD, 20 mg at 02/01/24 1102   magic mouthwash, 5 mL, Oral, TID PRN, Kerrin Elspeth BROCKS, MD, 5 mL at 01/29/24 2131   menthol  (CEPACOL) lozenge 3 mg, 1 lozenge, Oral, PRN, Kerrin Elspeth BROCKS, MD   mirtazapine  (REMERON ) tablet 15 mg, 15 mg, Oral, QHS, Hendrickson, Steven C, MD, 15 mg at 01/31/24 2143   ondansetron  (ZOFRAN ) injection 4 mg, 4 mg, Intravenous, Q8H PRN, Kerrin Elspeth BROCKS, MD, 4 mg at 01/25/24 1335   Oral care mouth rinse, 15 mL, Mouth Rinse, PRN, Kerrin Elspeth BROCKS, MD   pantoprazole  (PROTONIX ) EC tablet 40 mg, 40 mg, Oral, Daily, Kerrin Elspeth BROCKS, MD, 40 mg at 02/01/24 1103   potassium chloride  SA (KLOR-CON  M) CR tablet 40 mEq, 40 mEq, Oral, Daily, Marcine Catalan M, PA-C, 40 mEq at 02/01/24 1103   sodium chloride  (OCEAN) 0.65 % nasal spray 1 spray, 1 spray, Each Nare, PRN, Kerrin Elspeth BROCKS, MD   sodium chloride  flush (NS) 0.9 % injection 10-40 mL, 10-40 mL,  Intracatheter, Q12H, Kerrin Elspeth BROCKS, MD, 10 mL at 02/01/24 1107   sodium chloride  flush (NS) 0.9 % injection 10-40 mL, 10-40 mL, Intracatheter, PRN, Kerrin Elspeth BROCKS, MD   thiamine  (VITAMIN B1) tablet 100 mg, 100 mg, Oral, Daily, Hendrickson, Steven C, MD, 100 mg at 02/01/24 1102  Patients Current Diet:  Diet Order             DIET DYS 3 Room service appropriate? Yes with Assist; Fluid consistency: Thin  Diet effective now                  Precautions / Restrictions Precautions Precautions: Fall, Sternal Precaution Booklet Issued: No Precaution/Restrictions Comments: Reviewed precautions. Restrictions Weight Bearing Restrictions Per Provider Order: No RUE Weight Bearing Per Provider Order: Non weight bearing  RUE Partial Weight Bearing Percentage or Pounds: 5 LUE Weight Bearing Per Provider Order: Non weight bearing LUE Partial Weight Bearing Percentage or Pounds: 5 Other Position/Activity Restrictions: Cardiac sternal precautions   Has the patient had 2 or more falls or a fall with injury in the past year? No  Prior Activity Level Community (5-7x/wk): Independent, working as conservation officer, nature, driving  Prior Functional Level Self Care: Did the patient need help bathing, dressing, using the toilet or eating? Independent  Indoor Mobility: Did the patient need assistance with walking from room to room (with or without device)? Independent  Stairs: Did the patient need assistance with internal or external stairs (with or without device)? Independent  Functional Cognition: Did the patient need help planning regular tasks such as shopping or remembering to take medications? Independent  Patient Information Are you of Hispanic, Latino/a,or Spanish origin?: A. No, not of Hispanic, Latino/a, or Spanish origin What is your race?: A. White Do you need or want an interpreter to communicate with a doctor or health care staff?: 0. No  Patient's Response To:  Health Literacy and  Transportation Is the patient able to respond to health literacy and transportation needs?: Yes Health Literacy - How often do you need to have someone help you when you read instructions, pamphlets, or other written material from your doctor or pharmacy?: Never In the past 12 months, has lack of transportation kept you from medical appointments or from getting medications?: No In the past 12 months, has lack of transportation kept you from meetings, work, or from getting things needed for daily living?: No  Home Assistive Devices / Equipment Home Equipment: None  Prior Device Use: Indicate devices/aids used by the patient prior to current illness, exacerbation or injury? None of the above  Current Functional Level Cognition  Orientation Level: Oriented X4    Extremity Assessment (includes Sensation/Coordination)  Upper Extremity Assessment: Generalized weakness  Lower Extremity Assessment: Defer to PT evaluation    ADLs  Overall ADL's : Needs assistance/impaired Eating/Feeding: Set up, Sitting Grooming: Wash/dry hands, Contact guard assist, Standing Upper Body Bathing: Minimal assistance, Standing Upper Body Bathing Details (indicate cue type and reason): for back Lower Body Bathing: Minimal assistance, Sit to/from stand Lower Body Bathing Details (indicate cue type and reason): Cues for adhering to precautions Upper Body Dressing : Minimal assistance, Sitting Upper Body Dressing Details (indicate cue type and reason): verbally reviewed technique for UB dressing Lower Body Dressing: Contact guard assist Lower Body Dressing Details (indicate cue type and reason): donning/doffing R sock seated in chair Toilet Transfer: Contact guard assist, Rolling walker (2 wheels), Ambulation, Regular Toilet Toileting- Clothing Manipulation and Hygiene: Contact guard assist Toileting - Clothing Manipulation Details (indicate cue type and reason): Cues for adhering to precautions Functional  mobility during ADLs: Contact guard assist, Rolling walker (2 wheels) General ADL Comments: Increased difficulty adhering to sternal precautions and following cues    Mobility  Overal bed mobility: Needs Assistance Bed Mobility: Rolling, Sidelying to Sit Rolling: Supervision Sidelying to sit: Min assist Sit to sidelying: Contact guard assist General bed mobility comments: cues for sequence and precautions with assist to elevate trunk from surface    Transfers  Overall transfer level: Needs assistance Equipment used: Rolling walker (2 wheels), None Transfers: Sit to/from Stand Sit to Stand: Contact guard assist, Min assist Bed to/from chair/wheelchair/BSC transfer type:: Step pivot Stand pivot transfers: Max assist Step pivot transfers: Contact guard assist General transfer comment: initial rise from bed with min assist, cues for  hand placement. Pt performed additional 4 trials from chair with CGA and hands on thighs    Ambulation / Gait / Stairs / Wheelchair Mobility  Ambulation/Gait Ambulation/Gait assistance: Contact guard assist Gait Distance (Feet): 80 Feet Assistive device: Rolling walker (2 wheels) Gait Pattern/deviations: Step-through pattern, Decreased stride length, Trunk flexed General Gait Details: cues for proximity to RW and safety. pt fatigued after 80' needing seated rest prior to additional 65' with VSS Gait velocity: decreased Gait velocity interpretation: 1.31 - 2.62 ft/sec, indicative of limited community ambulator    Posture / Balance Balance Overall balance assessment: Needs assistance Sitting-balance support: No upper extremity supported, Feet supported Sitting balance-Leahy Scale: Fair Standing balance support: Bilateral upper extremity supported, During functional activity, Reliant on assistive device for balance Standing balance-Leahy Scale: Poor Standing balance comment: Rw in standing    Special considerations/life events     Previous Home  Environment  Living Arrangements: Alone  Lives With: Alone Available Help at Discharge: Family, Available 24 hours/day (dtr, [name] Jarvis, and son along with Aunt from [place] can provide 24/7) Type of Home: House Home Layout: One level Home Access: Stairs to enter Entrance Stairs-Rails: Left Entrance Stairs-Number of Steps: 5 Bathroom Shower/Tub: Health Visitor: Standard Bathroom Accessibility: Yes How Accessible: Accessible via walker Home Care Services: No  Discharge Living Setting Plans for Discharge Living Setting: Patient's home, House Type of Home at Discharge: House Discharge Home Layout: One level Discharge Home Access: Stairs to enter Entrance Stairs-Rails: Left Entrance Stairs-Number of Steps: 5 Discharge Bathroom Shower/Tub: Walk-in shower Discharge Bathroom Toilet: Standard Discharge Bathroom Accessibility: Yes How Accessible: Accessible via walker Does the patient have any problems obtaining your medications?: No  Social/Family/Support Systems Patient Roles: Parent Contact Information: [name], Daughter , main contact Anticipated Caregiver: [name] Jarvis and son, pt's Sister from [place] to come asisst at discharge Anticipated Caregiver's Contact Information: see contacts Ability/Limitations of Caregiver: children work but will arrange schedules Caregiver Availability: 24/7 Discharge Plan Discussed with Primary Caregiver: Yes Is Caregiver In Agreement with Plan?: Yes Does Caregiver/Family have Issues with Lodging/Transportation while Pt is in Rehab?: No  Goals Patient/Family Goal for Rehab: Mod I to supervision with PT and OT Expected length of stay: ELOS 7 to 10 days Additional Information: Treatment for fungemia to be po fluconazole  at discharge Pt/Family Agrees to Admission and willing to participate: Yes Program Orientation Provided & Reviewed with Pt/Caregiver Including Roles  & Responsibilities: Yes  Barriers to Discharge: Insurance for  SNF coverage  Decrease burden of Care through IP rehab admission: n/a  Possible need for SNF placement upon discharge: not anticipated  Patient Condition: This patient's condition remains as documented in the consult dated 01/14/24, in which the Rehabilitation Physician determined and documented that the patient's condition is appropriate for intensive rehabilitative care in an inpatient rehabilitation facility. Will admit to inpatient rehab today.  Preadmission Screen Completed By:  Alison Heron Lot, RN MSN 02/01/2024 4:19 PM ______________________________________________________________________   Discussed status with Dr. PIERRETTE on *** at *** and received approval for admission today.  Admission Coordinator:  Alison Heron Lot, RN MSN time PIERRETTEPattricia ***   Assessment/Plan: Diagnosis: *** Does the need for close, 24 hr/day Medical supervision in concert with the patient's rehab needs make it unreasonable for this patient to be served in a less intensive setting? {yes_no_potentially:3041433} Co-Morbidities requiring supervision/potential complications: *** Due to {due un:6958565}, does the patient require 24 hr/day rehab nursing? {yes_no_potentially:3041433} Does the patient require coordinated care of a physician, rehab nurse, PT, OT,  and SLP to address physical and functional deficits in the context of the above medical diagnosis(es)? {yes_no_potentially:3041433} Addressing deficits in the following areas: {deficits:3041436} Can the patient actively participate in an intensive therapy program of at least 3 hrs of therapy 5 days a week? {yes_no_potentially:3041433} The potential for patient to make measurable gains while on inpatient rehab is {potential:3041437} Anticipated functional outcomes upon discharge from inpatient rehab: {functional outcomes:304600100} PT, {functional outcomes:304600100} OT, {functional outcomes:304600100} SLP Estimated rehab length of stay to reach the  above functional goals is: *** Anticipated discharge destination: {anticipated dc setting:21604} 10. Overall Rehab/Functional Prognosis: {potential:3041437}   MD Signature: ***

## 2024-01-17 ENCOUNTER — Inpatient Hospital Stay (HOSPITAL_COMMUNITY)

## 2024-01-17 DIAGNOSIS — B377 Candidal sepsis: Secondary | ICD-10-CM | POA: Diagnosis not present

## 2024-01-17 DIAGNOSIS — I5021 Acute systolic (congestive) heart failure: Secondary | ICD-10-CM | POA: Diagnosis not present

## 2024-01-17 LAB — URINALYSIS, ROUTINE W REFLEX MICROSCOPIC
Bacteria, UA: NONE SEEN
Bilirubin Urine: NEGATIVE
Glucose, UA: NEGATIVE mg/dL
Ketones, ur: NEGATIVE mg/dL
Leukocytes,Ua: NEGATIVE
Nitrite: NEGATIVE
Protein, ur: NEGATIVE mg/dL
Specific Gravity, Urine: 1.005 (ref 1.005–1.030)
pH: 5 (ref 5.0–8.0)

## 2024-01-17 LAB — MAGNESIUM: Magnesium: 1.9 mg/dL (ref 1.7–2.4)

## 2024-01-17 LAB — CBC
HCT: 34.4 % — ABNORMAL LOW (ref 36.0–46.0)
Hemoglobin: 11.2 g/dL — ABNORMAL LOW (ref 12.0–15.0)
MCH: 30.6 pg (ref 26.0–34.0)
MCHC: 32.6 g/dL (ref 30.0–36.0)
MCV: 94 fL (ref 80.0–100.0)
Platelets: 120 K/uL — ABNORMAL LOW (ref 150–400)
RBC: 3.66 MIL/uL — ABNORMAL LOW (ref 3.87–5.11)
RDW: 18.6 % — ABNORMAL HIGH (ref 11.5–15.5)
WBC: 14.3 K/uL — ABNORMAL HIGH (ref 4.0–10.5)
nRBC: 0.8 % — ABNORMAL HIGH (ref 0.0–0.2)

## 2024-01-17 LAB — CG4 I-STAT (LACTIC ACID): Lactic Acid, Venous: 2.4 mmol/L (ref 0.5–1.9)

## 2024-01-17 LAB — ECHOCARDIOGRAM LIMITED
Area-P 1/2: 4.21 cm2
Height: 64 in
S' Lateral: 2.8 cm
Weight: 2684.32 [oz_av]

## 2024-01-17 LAB — COMPREHENSIVE METABOLIC PANEL WITH GFR
ALT: 337 U/L — ABNORMAL HIGH (ref 0–44)
AST: 341 U/L — ABNORMAL HIGH (ref 15–41)
Albumin: 2.9 g/dL — ABNORMAL LOW (ref 3.5–5.0)
Alkaline Phosphatase: 212 U/L — ABNORMAL HIGH (ref 38–126)
Anion gap: 13 (ref 5–15)
BUN: 30 mg/dL — ABNORMAL HIGH (ref 8–23)
CO2: 18 mmol/L — ABNORMAL LOW (ref 22–32)
Calcium: 8.8 mg/dL — ABNORMAL LOW (ref 8.9–10.3)
Chloride: 106 mmol/L (ref 98–111)
Creatinine, Ser: 1 mg/dL (ref 0.44–1.00)
GFR, Estimated: >60 mL/min (ref >60)
Glucose, Bld: 138 mg/dL — ABNORMAL HIGH (ref 70–99)
Potassium: 3.9 mmol/L (ref 3.5–5.1)
Sodium: 137 mmol/L (ref 135–145)
Total Bilirubin: 2.1 mg/dL — ABNORMAL HIGH (ref 0.0–1.2)
Total Protein: 6.1 g/dL — ABNORMAL LOW (ref 6.5–8.1)

## 2024-01-17 LAB — GLUCOSE, CAPILLARY
Glucose-Capillary: 118 mg/dL — ABNORMAL HIGH (ref 70–99)
Glucose-Capillary: 240 mg/dL — ABNORMAL HIGH (ref 70–99)
Glucose-Capillary: 145 mg/dL — ABNORMAL HIGH (ref 70–99)
Glucose-Capillary: 158 mg/dL — ABNORMAL HIGH (ref 70–99)

## 2024-01-17 LAB — COOXEMETRY PANEL
Carboxyhemoglobin: 1.3 % (ref 0.5–1.5)
Carboxyhemoglobin: 1.5 % (ref 0.5–1.5)
Carboxyhemoglobin: 1.7 % — ABNORMAL HIGH (ref 0.5–1.5)
Methemoglobin: <0.7 % (ref 0.0–1.5)
Methemoglobin: <0.7 % (ref 0.0–1.5)
Methemoglobin: 1.1 % (ref 0.0–1.5)
O2 Saturation: 44.9 %
O2 Saturation: 39.4 %
O2 Saturation: 52.8 %
Total hemoglobin: 11.6 g/dL — ABNORMAL LOW (ref 12.0–16.0)
Total hemoglobin: 10.7 g/dL — ABNORMAL LOW (ref 12.0–16.0)
Total hemoglobin: 11.9 g/dL — ABNORMAL LOW (ref 12.0–16.0)

## 2024-01-17 LAB — PROCALCITONIN: Procalcitonin: 0.23 ng/mL

## 2024-01-17 LAB — BASIC METABOLIC PANEL WITH GFR
Anion gap: 16 — ABNORMAL HIGH (ref 5–15)
BUN: 27 mg/dL — ABNORMAL HIGH (ref 8–23)
CO2: 21 mmol/L — ABNORMAL LOW (ref 22–32)
Calcium: 8.3 mg/dL — ABNORMAL LOW (ref 8.9–10.3)
Chloride: 100 mmol/L (ref 98–111)
Creatinine, Ser: 0.98 mg/dL (ref 0.44–1.00)
GFR, Estimated: >60 mL/min (ref >60)
Glucose, Bld: 239 mg/dL — ABNORMAL HIGH (ref 70–99)
Potassium: 3.8 mmol/L (ref 3.5–5.1)
Sodium: 137 mmol/L (ref 135–145)

## 2024-01-17 MED ORDER — MILRINONE LACTATE IN DEXTROSE 20-5 MG/100ML-% IV SOLN
0.1250 ug/kg/min | INTRAVENOUS | Status: DC
  Administered 2024-01-17 (×2): 0.25 ug/kg/min via INTRAVENOUS
  Administered 2024-01-18 – 2024-01-20 (×2): 0.125 ug/kg/min via INTRAVENOUS
  Administered 2024-01-21 (×2): 0.25 ug/kg/min via INTRAVENOUS
  Administered 2024-01-23: 0.125 ug/kg/min via INTRAVENOUS
  Filled 2024-01-17 (×6): qty 100
  Filled 2024-01-17: qty 200
  Filled 2024-01-17: qty 100

## 2024-01-17 MED ORDER — POTASSIUM CHLORIDE 10 MEQ/50ML IV SOLN
10.0000 meq | INTRAVENOUS | Status: AC
  Administered 2024-01-17 (×4): 10 meq via INTRAVENOUS
  Filled 2024-01-17 (×2): qty 50

## 2024-01-17 MED ORDER — AMIODARONE LOAD VIA INFUSION
150.0000 mg | Freq: Once | INTRAVENOUS | Status: AC
  Administered 2024-01-17: 150 mg via INTRAVENOUS
  Filled 2024-01-17: qty 83.34

## 2024-01-17 MED ORDER — FUROSEMIDE 10 MG/ML IJ SOLN
120.0000 mg | Freq: Once | INTRAVENOUS | Status: AC
  Administered 2024-01-17: 120 mg via INTRAVENOUS
  Filled 2024-01-17: qty 10

## 2024-01-17 MED ORDER — ORAL CARE MOUTH RINSE: 15.0000 mL | OROMUCOSAL | Status: DC | PRN

## 2024-01-17 MED ORDER — FUROSEMIDE 10 MG/ML IJ SOLN
15.0000 mg/h | INTRAVENOUS | Status: DC
  Administered 2024-01-17 (×2): 15 mg/h via INTRAVENOUS
  Filled 2024-01-17 (×4): qty 20

## 2024-01-17 MED ORDER — SODIUM CHLORIDE 0.9 % IV SOLN
2.0000 g | Freq: Two times a day (BID) | INTRAVENOUS | Status: AC
  Administered 2024-01-17 – 2024-01-21 (×10): 2 g via INTRAVENOUS
  Filled 2024-01-17 (×10): qty 12.5

## 2024-01-17 MED ORDER — LINEZOLID 600 MG/300ML IV SOLN
600.0000 mg | Freq: Two times a day (BID) | INTRAVENOUS | Status: AC
  Administered 2024-01-17 – 2024-01-21 (×10): 600 mg via INTRAVENOUS
  Filled 2024-01-17 (×10): qty 300

## 2024-01-17 MED ORDER — POTASSIUM CHLORIDE CRYS ER 20 MEQ PO TBCR: 40.0000 meq | EXTENDED_RELEASE_TABLET | Freq: Once | ORAL | Status: DC

## 2024-01-17 MED ORDER — AMIODARONE HCL IN DEXTROSE 360-4.14 MG/200ML-% IV SOLN
30.0000 mg/h | INTRAVENOUS | Status: AC
  Administered 2024-01-17 – 2024-01-24 (×15): 30 mg/h via INTRAVENOUS
  Filled 2024-01-17 (×6): qty 200
  Filled 2024-01-17: qty 400
  Filled 2024-01-17 (×7): qty 200

## 2024-01-17 MED ORDER — AMIODARONE HCL IN DEXTROSE 360-4.14 MG/200ML-% IV SOLN
60.0000 mg/h | INTRAVENOUS | Status: AC
  Administered 2024-01-17 (×2): 60 mg/h via INTRAVENOUS
  Filled 2024-01-17 (×2): qty 200

## 2024-01-17 MED ORDER — POTASSIUM CHLORIDE 10 MEQ/50ML IV SOLN
10.0000 meq | INTRAVENOUS | Status: AC
  Administered 2024-01-17 (×4): 10 meq via INTRAVENOUS
  Filled 2024-01-17 (×4): qty 50

## 2024-01-17 NOTE — Progress Notes (Signed)
 7 Days Post-Op Procedure(s) (LRB): REPAIR, MITRAL VALVE (N/A) ECHOCARDIOGRAM, TRANSESOPHAGEAL, INTRAOPERATIVE (N/A) Subjective: Does not feel as well today, a little more short of breath  Objective: Vital signs in last 24 hours: Temp:  [97.7 F (36.5 C)-98.3 F (36.8 C)] 97.7 F (36.5 C) (11/13 0756) Pulse Rate:  [48-82] 56 (11/13 0815) Cardiac Rhythm: Sinus bradycardia;Heart block (11/13 0400) Resp:  [12-26] 20 (11/13 0815) BP: (93-135)/(52-103) 135/73 (11/13 0815) SpO2:  [89 %-100 %] 94 % (11/13 0815) Weight:  [76.1 kg] 76.1 kg (11/13 0445)  Hemodynamic parameters for last 24 hours: CVP:  [9 mmHg-14 mmHg] 9 mmHg  Intake/Output from previous day: 11/12 0701 - 11/13 0700 In: 613.8 [P.O.:237; I.V.:26.9; IV Piggyback:349.9] Out: 805 [Urine:805] Intake/Output this shift: No intake/output data recorded.  General appearance: alert, cooperative, and no distress Neurologic: intact Heart: brady, regular Lungs: diminished breath sounds bilaterally Wound: intact  Lab Results: Recent Labs    01/16/24 0436 01/17/24 0453  WBC 8.6 14.3*  HGB 10.2* 11.2*  HCT 31.0* 34.4*  PLT 61* 120*   BMET:  Recent Labs    01/16/24 0436 01/17/24 0453  NA 143 137  K 3.7 3.9  CL 106 106  CO2 24 18*  GLUCOSE 110* 138*  BUN 29* 30*  CREATININE 0.98 1.00  CALCIUM 8.2* 8.8*    PT/INR: No results for input(s): LABPROT, INR in the last 72 hours. ABG    Component Value Date/Time   PHART 7.387 01/11/2024 0856   HCO3 19.3 (L) 01/11/2024 0856   TCO2 20 (L) 01/11/2024 0856   ACIDBASEDEF 5.0 (H) 01/11/2024 0856   O2SAT 39.4 01/17/2024 0756   CBG (last 3)  Recent Labs    01/16/24 1600 01/16/24 2105 01/17/24 0649  GLUCAP 138* 104* 118*    Assessment/Plan: S/P Procedure(s) (LRB): REPAIR, MITRAL VALVE (N/A) ECHOCARDIOGRAM, TRANSESOPHAGEAL, INTRAOPERATIVE (N/A) POD # 7 Doesn't look as good today NEURO - intact CV- sinus brady, co-ox low, mild lactic acidosis  Probably a  component of RV dysfunction, but I suspect low HR is a major contributor, not on any blocking agents  Options include A pacing and resuming milrinone- will /w AHF RESP- O2 requirement up slightly from 3L to 5 L  Diuresis, IS RENAL- creatinine normal, still above preop weight  Diurese ENDO- CBG well controlled GI- tolerating PO Thrombocytopenia- significantly better with PLT up to 120K  Consider enoxaparin if continues to improve Deconditioning- mobilize   LOS: 15 days    [name] 01/17/2024

## 2024-01-17 NOTE — Progress Notes (Signed)
 VAST consult received to obtain IV access in addition to DL PICC d/t multiple medications. Pt's left arm assessed utilizing ultrasound after visual assessment revealed many bruises and no appropriate vessels; no appropriate vessels in lower arm for cannulation. BP in lower arm not correlating with upper arm BP; decision made to hold off on IV placement for now.

## 2024-01-17 NOTE — Plan of Care (Signed)
  Problem: Education: Goal: Knowledge of General Education information will improve Description: Including pain rating scale, medication(s)/side effects and non-pharmacologic comfort measures Outcome: Progressing   Problem: Health Behavior/Discharge Planning: Goal: Ability to manage health-related needs will improve Outcome: Progressing   Problem: Elimination: Goal: Will not experience complications related to bowel motility Outcome: Progressing Goal: Will not experience complications related to urinary retention Outcome: Progressing   Problem: Pain Managment: Goal: General experience of comfort will improve and/or be controlled Outcome: Progressing   Problem: Education: Goal: Understanding of CV disease, CV risk reduction, and recovery process will improve Outcome: Progressing   Problem: Cardiovascular: Goal: Vascular access site(s) Level 0-1 will be maintained Outcome: Progressing   Problem: Clinical Measurements: Goal: Postoperative complications will be avoided or minimized Outcome: Progressing   Problem: Respiratory: Goal: Respiratory status will improve Outcome: Progressing

## 2024-01-17 NOTE — Progress Notes (Signed)
 Was called to bedside after pt went into Afib w/ RVR w/ variable rates from the 110s- 140s after starting back on milrinone.   Pt also noted to have several pauses captured on tele, longest 2.9 sec.   She still has surgical EPWs in place, pacing current turned off.   Will turn pacing back on w/ back up rate set to 40 bpm and give amiodarone bolus 150 mg x 1 followed by gtt at 60/hr.   D/w Dr. Rolan.   Updated patient and RN at bedside.   She is starting to diurese w/ IV Lasix.   Monitor closely   Caffie Shed, PA-C

## 2024-01-17 NOTE — Progress Notes (Signed)
 Physical Therapy Treatment Patient Details Name: Tracy Baker MRN: [id number] DOB: 14-Aug-1955 Today's Date: 01/17/2024   History of Present Illness 68 y.o. female presents to [facility] hospital on 12/29/2023 with after leaving AMA from [facility] where she was being treated for C. Albicans candidemia. TEE on 10/28 positive for vegetation of mitral valve. Extraction of multiple teeth on 11/3. Mitral valve repair on 11/6. PMH includes thyroid  disease.    PT Comments  Making progress towards acute functional goals. Min assist for bed mobility, CGA to transfer out of bed, and min assist to ambulate short distance in room with RW for support. Pt fatigues quickly, moderate dyspnea with SpO2 in 90s on 6L today (on 6L when PT entered room.) Reviewed precautions, minor reminders throughout, no overt buckling. A little anxious but tolerated well overall. Patient will continue to benefit from skilled physical therapy services to further improve independence with functional mobility.     If plan is discharge home, recommend the following: Assistance with cooking/housework;Assist for transportation;Help with stairs or ramp for entrance;A little help with walking and/or transfers;A little help with bathing/dressing/bathroom   Can travel by private vehicle        Equipment Recommendations  Rolling walker (2 wheels);BSC/3in1    Recommendations for Other Services Rehab consult     Precautions / Restrictions Precautions Precautions: Fall;Other (comment);Sternal Precaution Booklet Issued: No Recall of Precautions/Restrictions: Impaired Precaution/Restrictions Comments: Reviewed precautions. Restrictions Weight Bearing Restrictions Per Provider Order: Yes (sternal precautions) Other Position/Activity Restrictions: Cardiac sternal precautions     Mobility  Bed Mobility Overal bed mobility: Needs Assistance Bed Mobility: Rolling, Sidelying to Sit Rolling: Contact guard assist Sidelying to sit: Min assist, HOB  elevated       General bed mobility comments: CGA to roll partially and min assist to rise. Using heart pillow to maintain precautions and comfort. Cues for technique.    Transfers Overall transfer level: Needs assistance Equipment used: Rolling walker (2 wheels) Transfers: Sit to/from Stand Sit to Stand: Contact guard assist, From elevated surface           General transfer comment: CGA for safety to rise from edge of bed. Good LE power up, holding pillow for support, transitioned to RW without issues.    Ambulation/Gait Ambulation/Gait assistance: Min assist Gait Distance (Feet): 30 Feet Assistive device: Rolling walker (2 wheels) Gait Pattern/deviations: Step-through pattern, Decreased stride length, Drifts right/left, Narrow base of support Gait velocity: decreased Gait velocity interpretation: <1.31 ft/sec, indicative of household ambulator   General Gait Details: Min assist for RW control but otherwise stable with device. Cues for proximity, awareness, and upright posture which was well maintained throughout bout to adhear to precautions. Moderate dyspnea towards end of distance. SpO2 90s on 6L supplemental O2. HR in 80s-90s. Denies dizziness. Pretty fatigued with this distance.   Stairs             Wheelchair Mobility     Tilt Bed    Modified Rankin (Stroke Patients Only)       Balance Overall balance assessment: Needs assistance Sitting-balance support: No upper extremity supported, Feet supported Sitting balance-Leahy Scale: Fair     Standing balance support: Bilateral upper extremity supported, During functional activity, Reliant on assistive device for balance Standing balance-Leahy Scale: Poor                              Communication Communication Communication: No apparent difficulties  Cognition Arousal: Alert Behavior During Therapy:  WFL for tasks assessed/performed   PT - Cognitive impairments: Problem solving,  Safety/Judgement                         Following commands: Intact Following commands impaired: Only follows one step commands consistently    Cueing Cueing Techniques: Verbal cues, Gestural cues  Exercises General Exercises - Lower Extremity Ankle Circles/Pumps: AROM, Both, 10 reps, Seated Quad Sets: Strengthening, Both, 10 reps, Seated Gluteal Sets: Strengthening, Both, 10 reps, Seated    General Comments General comments (skin integrity, edema, etc.): VSS throughout visit on 6L      Pertinent Vitals/Pain Pain Assessment Pain Assessment: Faces Faces Pain Scale: Hurts a little bit Pain Location: sternum Pain Descriptors / Indicators: Aching, Sore Pain Intervention(s): Limited activity within patient's tolerance, Monitored during session, Repositioned    Home Living                          Prior Function            PT Goals (current goals can now be found in the care plan section) Acute Rehab PT Goals Patient Stated Goal: to return to work PT Goal Formulation: With patient Time For Goal Achievement: 01/27/24 Potential to Achieve Goals: Fair Progress towards PT goals: Progressing toward goals    Frequency    Min 2X/week      PT Plan      Co-evaluation              AM-PAC PT 6 Clicks Mobility   Outcome Measure  Help needed turning from your back to your side while in a flat bed without using bedrails?: A Little Help needed moving from lying on your back to sitting on the side of a flat bed without using bedrails?: A Little Help needed moving to and from a bed to a chair (including a wheelchair)?: A Little Help needed standing up from a chair using your arms (e.g., wheelchair or bedside chair)?: A Little Help needed to walk in hospital room?: A Little Help needed climbing 3-5 steps with a railing? : Total 6 Click Score: 16    End of Session Equipment Utilized During Treatment: Oxygen Activity Tolerance: Patient tolerated  treatment well Patient left: with call bell/phone within reach;with nursing/sitter in room;in chair;with chair alarm set;with family/visitor present Nurse Communication: Mobility status PT Visit Diagnosis: Other abnormalities of gait and mobility (R26.89);Muscle weakness (generalized) (M62.81);Difficulty in walking, not elsewhere classified (R26.2);Pain Pain - part of body:  (sternum)     Time: 8441-8367 PT Time Calculation (min) (ACUTE ONLY): 34 min  Charges:    $Gait Training: 8-22 mins $Therapeutic Activity: 8-22 mins PT General Charges $$ ACUTE PT VISIT: 1 Visit                     [name], PT, DPT [facility]  Rehabilitation Services Physical Therapist Office: [phone] Website: [email or web address]    [name] 01/17/2024, 5:33 PM

## 2024-01-17 NOTE — Progress Notes (Addendum)
 Patient ID: Tracy Baker, female   DOB: 10/30/1955, 68 y.o.   MRN: [id number]     Advanced Heart Failure Rounding Note  Cardiologist: Lonni Cash, MD  Chief Complaint: S/p MV Repair / MV endocarditis   Patient Profile   Tracy Baker is a 68 y.o. female with hx of recent nephrolithiasis s/p ureteral stent and lithotripsy at [facility] 10/10 with stent removal. 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 Post-op course c/w junctional bradycardia.   Subjective:    POD #7 s/p MV repair for candidal MV endocarditis.   Milrinone discontinued yesterday.  Early AM co-ox 49% (repeat pending).   WBC 8.6>>14.3 but afebrile.   She feels worse this morning. Breathing has worsened w/ increased supp O2 requirements, now on 6L/min (up from 2L/ min). CVP 12. She's up 20 lb above pre-op wt.   SCr stable 1.0. LFTs slightly elevated. CO2 18.   Objective:   Weight Range: 76.1 kg Body mass index is 28.8 kg/m.   Vital Signs:   Temp:  [97.7 F (36.5 C)-98.3 F (36.8 C)] 97.9 F (36.6 C) (11/13 0000) Pulse Rate:  [48-82] 54 (11/13 0700) Resp:  [12-23] 21 (11/13 0700) BP: (93-132)/(58-103) 112/72 (11/13 0700) SpO2:  [90 %-100 %] 92 % (11/13 0700) Weight:  [76.1 kg] 76.1 kg (11/13 0445) Last BM Date : 01/16/24  Weight change: Filed Weights   01/15/24 0500 01/16/24 0512 01/17/24 0445  Weight: 77.6 kg 76.5 kg 76.1 kg    Intake/Output:   Intake/Output Summary (Last 24 hours) at 01/17/2024 0737 Last data filed at 01/17/2024 0120 Gross per 24 hour  Intake 613.81 ml  Output 805 ml  Net -191.19 ml    Physical Exam   CVP 12  GENERAL: fatigued appearing. NAD Lungs- diminished at the bases  CARDIAC:  JVP 12 cm         Normal rate with regular rhythm. No MRG. No pitting edema ABDOMEN: Soft, non-tender, non-distended.  EXTREMITIES: LEs cool to touch  NEUROLOGIC: No obvious FND   Telemetry   SB 50s, (Personally reviewed)    Labs    CBC Recent  Labs    01/16/24 0436 01/17/24 0453  WBC 8.6 14.3*  HGB 10.2* 11.2*  HCT 31.0* 34.4*  MCV 93.7 94.0  PLT 61* 120*   Basic Metabolic Panel Recent Labs    88/87/74 0436 01/17/24 0453  NA 143 137  K 3.7 3.9  CL 106 106  CO2 24 18*  GLUCOSE 110* 138*  BUN 29* 30*  CREATININE 0.98 1.00  CALCIUM 8.2* 8.8*  MG 2.0 1.9   Liver Function Tests Recent Labs    01/16/24 0436 01/17/24 0453  AST 236* 341*  ALT 275* 337*  ALKPHOS 182* 212*  BILITOT 1.8* 2.1*  PROT 5.4* 6.1*  ALBUMIN 2.7* 2.9*   Medications:   Scheduled Medications:  aspirin EC  325 mg Oral Daily   Chlorhexidine Gluconate Cloth  6 each Topical Daily   docusate sodium  200 mg Oral Daily   feeding supplement  237 mL Oral BID BM   furosemide  60 mg Oral Daily   insulin aspart  0-15 Units Subcutaneous TID WC   insulin aspart  0-5 Units Subcutaneous QHS   pantoprazole  (PROTONIX ) IV  40 mg Intravenous Daily   scopolamine  2 patch Transdermal Q72H   sodium chloride  flush  10-40 mL Intracatheter Q12H   spironolactone  25 mg Oral Daily    Infusions:  fluconazole (DIFLUCAN) IV Stopped (01/16/24  1127)    PRN Medications: bisacodyl **OR** [DISCONTINUED] bisacodyl, fentaNYL  (SUBLIMAZE ) injection, HYDROmorphone, LORazepam, prochlorperazine, sodium chloride  flush   Assessment/Plan  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 Kerrin - Pre-op [facility] 01/04/24 with no CAD and normal L/R heart pressures.  - Intra-op TEE LVEF 60-65%, RV mildly reduced, small immobile mass on anterior valve leaflet, mod MR, trivial TR - Co-ox 49% off Milrinone. CVP 12, wt still well above pre-op wt and now w/ increased O2 requirements at 6L/min. - Repeat Co-ox and send LA. Suspect will need to restart milrinone - restart IV Lasix 120 mg bid for diuresis  - Continue spiro 25 mg daily - if repeat co-ox low, will need repeat limited echo to reassess LV/RV   Junctional HR - EPW  rate turned down to 30s - stable underlying rhythm, SB in the 50s   Hx of nephrolithiasis  - s/p ureteral stent and lithotripsy   4. ID - On fluconazole for candidal bacteremia/MV endocarditis. Plan for fluconazole PO at discharge  5. Elevated LFTs - Marked jump in LFTs 11/8. Significant improvement but trending back up today  - concern for low output. Repeat co-ox and anticipate adding milrinone back   6. Leukocytosis  - WBC 8>>14, increased O2 requirements overnight (suspect 2/2 CHF but will obtain PCT to r/o PNA)  - CXR ordered    CRITICAL CARE Performed by: Caffie Shed   Total critical care time: 15 minutes  Critical care time was exclusive of separately billable procedures and treating other patients.  Critical care was necessary to treat or prevent imminent or life-threatening deterioration.  Critical care was time spent personally by me on the following activities: development of treatment plan with patient and/or surrogate as well as nursing, discussions with consultants, evaluation of patient's response to treatment, examination of patient, obtaining history from patient or surrogate, ordering and performing treatments and interventions, ordering and review of laboratory studies, ordering and review of radiographic studies, pulse oximetry and re-evaluation of patient's condition.  Caffie Shed, PA-C   Patient seen with PA, I formulated the plan and agree with the above note.   Patient feels worse today, more short of breath.  Worsening orthopnea.   Co-ox down to 39% off milrinone.  Lactate 2.4. HR 55, NSR.  BP stable. Afebrile but WBCs up to 14.  CVP 12-13, CXR with worsening pulmonary edema. Weight still up 20 lbs from pre-op.   General: Sitting upright, mildly tachypneic.  Neck: JVP 14 cm, no thyromegaly or thyroid  nodule.  Lungs: Crackles at bases.  CV: Nondisplaced PMI.  Heart regular S1/S2, no S3/S4, no murmur.  1+ edema 1/2 to knees.  Abdomen: Soft,  nontender, no hepatosplenomegaly, no distention.  Skin: Intact without lesions or rashes.  Neurologic: Alert and oriented x 3.  Psych: Normal affect. Extremities: No clubbing or cyanosis.  HEENT: Normal.   Echo from 11/8 showed EF 65-70% with mild RV dysfunction, s/p MV repair with mild MR and mean gradient 4 mmHg.  Milrinone was weaned off yesterday.  However, this morning, she is tachypneic/orthopneic with co-ox down to 39% and CVP 12-13.  Weight still up significantly from pre-op. CXR shows pulmonary edema.  - Restart milrinone at 0.25 and repeat co-ox in pm. - Lasix 120 mg IV x 1 and start gtt at 15 mg/hr. Replace K, recheck BMET in pm.  - I am going to repeat echo to reassess LV/RV/mitral valve.  - If she does not make  significant improvement with these measures, will take for RHC.   Stable NSR in 50s.  Currently not pacing.   With elevated WBCs and worsening CXR, will cover empirically for HAP with cefepime/linezolid.  PCT not particularly elevated at 0.23.  Will send sputum culture.   She continues on fluconazole for fungal PNA.   CRITICAL CARE Performed by: Ezra Shuck  Total critical care time: 45 minutes  Critical care time was exclusive of separately billable procedures and treating other patients.  Critical care was necessary to treat or prevent imminent or life-threatening deterioration.  Critical care was time spent personally by me on the following activities: development of treatment plan with patient and/or surrogate as well as nursing, discussions with consultants, evaluation of patient's response to treatment, examination of patient, obtaining history from patient or surrogate, ordering and performing treatments and interventions, ordering and review of laboratory studies, ordering and review of radiographic studies, pulse oximetry and re-evaluation of patient's condition.  Ezra Shuck 01/17/2024 9:20 AM

## 2024-01-17 NOTE — Progress Notes (Signed)
   [address], [place] [address]             [phone]   Feels better this evening  Went into Atrial fib with controlled VR in 90s Started on amiodarone  BP 130/77   Pulse 91   Temp (!) 96.8 F (36 C) (Axillary)   Resp 19   Ht 5' 4 (1.626 m)   Wt 76.1 kg   SpO2 96%   BMI 28.80 kg/m  CVP 10 5L [place] 93% sat   Intake/Output Summary (Last 24 hours) at 01/17/2024 1747 Last data filed at 01/17/2024 1600 Gross per 24 hour  Intake 1077.21 ml  Output 3140 ml  Net -2062.79 ml   Responding well to diuretics today Back on milrinone 0.25  Will need to anticoagulate if atrial fib persists  Elspeth C. Kerrin, MD [facility] [phone]

## 2024-01-17 NOTE — Progress Notes (Signed)
 OT Cancellation Note  Patient Details Name: Tracy Baker MRN: [id number] DOB: January 02, 1956   Cancelled Treatment:    Reason Eval/Treat Not Completed: Medical issues which prohibited therapy. Per RN, pt c/o of SOB and chest pain, awaiting chest x-ray, suggesting hold tx session. Will follow up in PM as able to.   Tomorrow Dehaas C, OT  Acute Rehabilitation Services Office [phone] Secure chat preferred   Adrianne GORMAN Savers 01/17/2024, 7:35 AM

## 2024-01-18 DIAGNOSIS — B377 Candidal sepsis: Secondary | ICD-10-CM | POA: Diagnosis not present

## 2024-01-18 LAB — COMPREHENSIVE METABOLIC PANEL WITH GFR
ALT: 244 U/L — ABNORMAL HIGH (ref 0–44)
AST: 155 U/L — ABNORMAL HIGH (ref 15–41)
Albumin: 2.7 g/dL — ABNORMAL LOW (ref 3.5–5.0)
Alkaline Phosphatase: 194 U/L — ABNORMAL HIGH (ref 38–126)
Anion gap: 15 (ref 5–15)
BUN: 23 mg/dL (ref 8–23)
CO2: 26 mmol/L (ref 22–32)
Calcium: 8.5 mg/dL — ABNORMAL LOW (ref 8.9–10.3)
Chloride: 97 mmol/L — ABNORMAL LOW (ref 98–111)
Creatinine, Ser: 0.96 mg/dL (ref 0.44–1.00)
GFR, Estimated: >60 mL/min (ref >60)
Glucose, Bld: 209 mg/dL — ABNORMAL HIGH (ref 70–99)
Potassium: 2.9 mmol/L — ABNORMAL LOW (ref 3.5–5.1)
Sodium: 138 mmol/L (ref 135–145)
Total Bilirubin: 2.7 mg/dL — ABNORMAL HIGH (ref 0.0–1.2)
Total Protein: 5.7 g/dL — ABNORMAL LOW (ref 6.5–8.1)

## 2024-01-18 LAB — COOXEMETRY PANEL
Carboxyhemoglobin: 2.6 % — ABNORMAL HIGH (ref 0.5–1.5)
Methemoglobin: 0.8 % (ref 0.0–1.5)
O2 Saturation: 62.3 %
Total hemoglobin: 12.1 g/dL (ref 12.0–16.0)

## 2024-01-18 LAB — BASIC METABOLIC PANEL WITH GFR
Anion gap: 13 (ref 5–15)
BUN: 21 mg/dL (ref 8–23)
CO2: 24 mmol/L (ref 22–32)
Calcium: 8.3 mg/dL — ABNORMAL LOW (ref 8.9–10.3)
Chloride: 96 mmol/L — ABNORMAL LOW (ref 98–111)
Creatinine, Ser: 1.02 mg/dL — ABNORMAL HIGH (ref 0.44–1.00)
GFR, Estimated: 60 mL/min — ABNORMAL LOW (ref >60)
Glucose, Bld: 214 mg/dL — ABNORMAL HIGH (ref 70–99)
Potassium: 3.3 mmol/L — ABNORMAL LOW (ref 3.5–5.1)
Sodium: 133 mmol/L — ABNORMAL LOW (ref 135–145)

## 2024-01-18 LAB — MRSA CULTURE: Culture: NOT DETECTED

## 2024-01-18 LAB — CBC
HCT: 35.1 % — ABNORMAL LOW (ref 36.0–46.0)
Hemoglobin: 11.9 g/dL — ABNORMAL LOW (ref 12.0–15.0)
MCH: 31.3 pg (ref 26.0–34.0)
MCHC: 33.9 g/dL (ref 30.0–36.0)
MCV: 92.4 fL (ref 80.0–100.0)
Platelets: 133 K/uL — ABNORMAL LOW (ref 150–400)
RBC: 3.8 MIL/uL — ABNORMAL LOW (ref 3.87–5.11)
RDW: 19.5 % — ABNORMAL HIGH (ref 11.5–15.5)
WBC: 11.5 K/uL — ABNORMAL HIGH (ref 4.0–10.5)
nRBC: 0.5 % — ABNORMAL HIGH (ref 0.0–0.2)

## 2024-01-18 LAB — GLUCOSE, CAPILLARY
Glucose-Capillary: 161 mg/dL — ABNORMAL HIGH (ref 70–99)
Glucose-Capillary: 148 mg/dL — ABNORMAL HIGH (ref 70–99)
Glucose-Capillary: 144 mg/dL — ABNORMAL HIGH (ref 70–99)
Glucose-Capillary: 101 mg/dL — ABNORMAL HIGH (ref 70–99)

## 2024-01-18 LAB — MAGNESIUM: Magnesium: 1.5 mg/dL — ABNORMAL LOW (ref 1.7–2.4)

## 2024-01-18 LAB — MRSA NEXT GEN BY PCR, NASAL: MRSA by PCR Next Gen: NOT DETECTED

## 2024-01-18 MED ORDER — POTASSIUM CHLORIDE 10 MEQ/50ML IV SOLN
10.0000 meq | INTRAVENOUS | Status: AC
  Administered 2024-01-18 – 2024-01-19 (×6): 10 meq via INTRAVENOUS
  Filled 2024-01-18 (×6): qty 50

## 2024-01-18 MED ORDER — ENOXAPARIN SODIUM 40 MG/0.4ML IJ SOSY
40.0000 mg | PREFILLED_SYRINGE | INTRAMUSCULAR | Status: DC
  Administered 2024-01-18 – 2024-01-19 (×2): 40 mg via SUBCUTANEOUS
  Filled 2024-01-18 (×2): qty 0.4

## 2024-01-18 MED ORDER — POTASSIUM CHLORIDE 10 MEQ/50ML IV SOLN: 10.0000 meq | INTRAVENOUS | Status: DC

## 2024-01-18 MED ORDER — POTASSIUM CHLORIDE 10 MEQ/50ML IV SOLN
10.0000 meq | INTRAVENOUS | Status: DC
  Administered 2024-01-18 (×2): 10 meq via INTRAVENOUS
  Filled 2024-01-18 (×3): qty 50

## 2024-01-18 MED ORDER — CLONAZEPAM 0.5 MG PO TABS
0.5000 mg | ORAL_TABLET | Freq: Three times a day (TID) | ORAL | Status: DC | PRN
  Administered 2024-01-20 – 2024-02-07 (×5): 0.5 mg via ORAL
  Filled 2024-01-18 (×6): qty 1

## 2024-01-18 MED ORDER — TORSEMIDE 20 MG PO TABS
60.0000 mg | ORAL_TABLET | Freq: Every day | ORAL | Status: DC
  Administered 2024-01-18 – 2024-01-19 (×2): 60 mg via ORAL
  Filled 2024-01-18 (×2): qty 3

## 2024-01-18 MED ORDER — FUROSEMIDE 40 MG PO TABS: 60.0000 mg | ORAL_TABLET | Freq: Every day | ORAL | Status: DC

## 2024-01-18 MED ORDER — MAGNESIUM SULFATE 4 GM/100ML IV SOLN
4.0000 g | Freq: Once | INTRAVENOUS | Status: AC
  Administered 2024-01-18: 4 g via INTRAVENOUS
  Filled 2024-01-18: qty 100

## 2024-01-18 MED ORDER — POTASSIUM CHLORIDE 10 MEQ/50ML IV SOLN
10.0000 meq | INTRAVENOUS | Status: AC
  Administered 2024-01-18 (×4): 10 meq via INTRAVENOUS
  Filled 2024-01-18 (×2): qty 50

## 2024-01-18 NOTE — Plan of Care (Signed)
  Problem: Education: Goal: Knowledge of General Education information will improve Description: Including pain rating scale, medication(s)/side effects and non-pharmacologic comfort measures Outcome: Progressing   Problem: Health Behavior/Discharge Planning: Goal: Ability to manage health-related needs will improve Outcome: Progressing   Problem: Clinical Measurements: Goal: Ability to maintain clinical measurements within normal limits will improve Outcome: Progressing Goal: Diagnostic test results will improve Outcome: Progressing   Problem: Activity: Goal: Risk for activity intolerance will decrease Outcome: Progressing   Problem: Nutrition: Goal: Adequate nutrition will be maintained Outcome: Not Progressing   Problem: Elimination: Goal: Will not experience complications related to bowel motility Outcome: Progressing Goal: Will not experience complications related to urinary retention Outcome: Progressing   Problem: Pain Managment: Goal: General experience of comfort will improve and/or be controlled Outcome: Progressing   Problem: Education: Goal: Understanding of CV disease, CV risk reduction, and recovery process will improve Outcome: Progressing   Problem: Respiratory: Goal: Respiratory status will improve Outcome: Progressing

## 2024-01-18 NOTE — Progress Notes (Signed)
   Inpatient Rehabilitation Admissions Coordinator   I await medical progress before beginning Auth for possible Cir admit. We will follow up next week.  Heron Leavell, RN, MSN Rehab Admissions Coordinator [phone] 01/18/2024 12:23 PM

## 2024-01-18 NOTE — Progress Notes (Signed)
 Occupational Therapy Treatment Patient Details Name: Tracy Baker MRN: [id number] DOB: [birth year] Today's Date: 01/18/2024   History of present illness 68 y.o. female presents to [facility] hospital on 12/29/2023 with after leaving AMA from [facility] where she was being treated for C. Albicans candidemia. TEE on 10/28 positive for vegetation of mitral valve. Extraction of multiple teeth on 11/3. Mitral valve repair on 11/6. PMH includes thyroid  disease.   OT comments  Pt progressing well towards goals. Progressed to complete toilet hygiene and grooming tasks standing at sink with CGA. Provided and reviewed sternal precautions handout with pt. Demo'd compensatory strategies for UB/LB dressing, but pt would benefit from further practice. Pt continues to be limited by decreased activity tolerance requiring x3 seated rest breaks during session. Continue to recommend >3 hours of skilled rehab daily to optimize independence levels. Will continue to follow acutely.      If plan is discharge home, recommend the following:  A lot of help with bathing/dressing/bathroom;A lot of help with walking and/or transfers;Assist for transportation;Assistance with cooking/housework   Equipment Recommendations  Tub/shower seat       Precautions / Restrictions Precautions Precautions: Fall;Other (comment);Sternal Precaution Booklet Issued: Yes (comment) Recall of Precautions/Restrictions: Intact Restrictions Weight Bearing Restrictions Per Provider Order: No Other Position/Activity Restrictions: Cardiac sternal precautions       Mobility Bed Mobility   General bed mobility comments: Received on BSC    Transfers Overall transfer level: Needs assistance Equipment used: Rolling walker (2 wheels) Transfers: Sit to/from Stand Sit to Stand: Contact guard assist, From elevated surface           General transfer comment: CGA cues for hand placement, good use of momentum to stand     Balance Overall balance  assessment: Needs assistance Sitting-balance support: No upper extremity supported, Feet supported Sitting balance-Leahy Scale: Fair     Standing balance support: Bilateral upper extremity supported, During functional activity, Reliant on assistive device for balance Standing balance-Leahy Scale: Poor Standing balance comment: Reliant on RW     ADL either performed or assessed with clinical judgement   ADL Overall ADL's : Needs assistance/impaired     Grooming: Wash/dry hands;Contact guard assist;Standing     Toilet Transfer: Contact guard assist;Rolling walker (2 wheels);BSC/3in1;Ambulation   Toileting- Clothing Manipulation and Hygiene: Contact guard assist;Sit to/from stand;Cueing for compensatory techniques       Functional mobility during ADLs: Contact guard assist;Rolling walker (2 wheels)      Extremity/Trunk Assessment Upper Extremity Assessment Upper Extremity Assessment: Generalized weakness   Lower Extremity Assessment Lower Extremity Assessment: Defer to PT evaluation        Vision   Vision Assessment?: No apparent visual deficits         Communication Communication Communication: No apparent difficulties Factors Affecting Communication: Reduced clarity of speech   Cognition Arousal: Alert Behavior During Therapy: WFL for tasks assessed/performed Cognition: No apparent impairments     Following commands: Intact        Cueing   Cueing Techniques: Verbal cues, Gestural cues        General Comments HD stable on RA, sustaining 94% or greater    Pertinent Vitals/ Pain       Pain Assessment Pain Assessment: Faces Faces Pain Scale: Hurts a little bit Pain Location: sternum Pain Descriptors / Indicators: Aching, Sore Pain Intervention(s): Limited activity within patient's tolerance   Frequency  Min 2X/week        Progress Toward Goals  OT Goals(current goals can now be found  in the care plan section)  Progress towards OT goals:  Progressing toward goals  Acute Rehab OT Goals Patient Stated Goal: To go home OT Goal Formulation: With patient Time For Goal Achievement: 01/29/24 Potential to Achieve Goals: Good ADL Goals Pt Will Perform Grooming: with supervision;standing Pt Will Perform Upper Body Bathing: with supervision;sitting Pt Will Perform Lower Body Bathing: with min assist;sit to/from stand Pt Will Perform Upper Body Dressing: with supervision;sitting Pt Will Perform Lower Body Dressing: with min assist;sit to/from stand Pt Will Transfer to Toilet: with supervision;ambulating;regular height toilet  Plan         AM-PAC OT 6 Clicks Daily Activity     Outcome Measure   Help from another person eating meals?: None Help from another person taking care of personal grooming?: None Help from another person toileting, which includes using toliet, bedpan, or urinal?: A Little Help from another person bathing (including washing, rinsing, drying)?: A Little Help from another person to put on and taking off regular upper body clothing?: A Little Help from another person to put on and taking off regular lower body clothing?: A Little 6 Click Score: 20    End of Session Equipment Utilized During Treatment: Rolling walker (2 wheels)  OT Visit Diagnosis: Unsteadiness on feet (R26.81);Muscle weakness (generalized) (M62.81)   Activity Tolerance Patient tolerated treatment well   Patient Left in chair;with call bell/phone within reach;with nursing/sitter in room   Nurse Communication Mobility status        Time: 9251-9176 OT Time Calculation (min): 35 min  Charges: OT General Charges $OT Visit: 1 Visit OT Treatments $Self Care/Home Management : 23-37 mins  Adrianne BROCKS, OT  Acute Rehabilitation Services Office [phone] Secure chat preferred   Adrianne GORMAN Savers 01/18/2024, 11:02 AM

## 2024-01-18 NOTE — Progress Notes (Addendum)
 [address] [employer] [address]             [phone]      8 Days Post-Op Procedure(s) (LRB): REPAIR, MITRAL VALVE (N/A) ECHOCARDIOGRAM, TRANSESOPHAGEAL, INTRAOPERATIVE (N/A) Subjective: Patient sitting on the bedside commode reports she feels better today than she did yesterday.   Objective: Vital signs in last 24 hours: Temp:  [96.8 F (36 C)-99.3 F (37.4 C)] 98.8 F (37.1 C) (11/14 0700) Pulse Rate:  [54-116] 92 (11/14 0700) Cardiac Rhythm: Normal sinus rhythm;Atrial fibrillation (11/14 0400) Resp:  [10-30] 13 (11/14 0700) BP: (77-164)/(52-107) 111/61 (11/14 0700) SpO2:  [91 %-98 %] 97 % (11/14 0700) Weight:  [72.3 kg] 72.3 kg (11/14 0500)  Hemodynamic parameters for last 24 hours: CVP:  [2 mmHg-14 mmHg] 2 mmHg  Intake/Output from previous day: 11/13 0701 - 11/14 0700 In: 2199.3 [I.V.:708.7; IV Piggyback:1490.6] Out: 6245 [Urine:6245] Intake/Output this shift: No intake/output data recorded.  General appearance: alert, cooperative, and no distress Neurologic: intact Heart: sinus tachycardia, no murmur Lungs: diminished bibasilar breath sounds Abdomen: soft, non-tender; bowel sounds normal; no masses,  no organomegaly Extremities: edema trace BLE Wound: Clean and dry without sign of infection  Lab Results: Recent Labs    01/17/24 0453 01/18/24 0454  WBC 14.3* 11.5*  HGB 11.2* 11.9*  HCT 34.4* 35.1*  PLT 120* 133*   BMET:  Recent Labs    01/17/24 1447 01/18/24 0454  NA 137 138  K 3.8 2.9*  CL 100 97*  CO2 21* 26  GLUCOSE 239* 209*  BUN 27* 23  CREATININE 0.98 0.96  CALCIUM 8.3* 8.5*    PT/INR: No results for input(s): LABPROT, INR in the last 72 hours. ABG    Component Value Date/Time   PHART 7.387 01/11/2024 0856   HCO3 19.3 (L) 01/11/2024 0856   TCO2 20 (L) 01/11/2024 0856   ACIDBASEDEF 5.0 (H) 01/11/2024 0856   O2SAT 62.3 01/18/2024 0454   CBG (last 3)  Recent Labs    01/17/24 1139 01/17/24 1635  01/17/24 2105  GLUCAP 240* 145* 158*    Assessment/Plan: S/P Procedure(s) (LRB): REPAIR, MITRAL VALVE (N/A) ECHOCARDIOGRAM, TRANSESOPHAGEAL, INTRAOPERATIVE (N/A)  Neuro: Pain controlled, continue current pain regimen.   CV: BP stable, SBP 110s. Postop junctional rhythm and afib with RVR. On IV Amiodarone. NSR-ST this AM, HR 90-110s with PVCs. Pacer set at backup VVI 40. Coox 62 this AM. On milrinone 0.25. Wean per AHF team. On Spironolactone 25mg  daily. May need DOAC if she goes back into afib.   Pulm: Saturating well on 2L [place] but on RA while on bedside commode. CXR yesterday with bibasilar atelectasis, small bilateral pleural effusions and mild worsening interstitial prominence indicating edema vs pneumonia. Started on Cefepime and Linezolid yesterday for HCAP. Encourage IS and ambulation  GI: Little appetite, advance diet as tolerated. +BM. No nausea or vomiting.   Endo: Preop A1C 5.4, looks like history of prediabetes, no home meds. 1 episode of elevated CBG yesterday but otherwise controlled. On SSI, may need to add meal coverage if becomes more elevated as she increases diet.   Renal: Cr 0.96, stable. UO 6245cc/24hrs. +5lbs from preop weight. On Lasix drip per AHF team. K 2.9, supplement.   ID: Leukocytosis, 11.5 improved from 14 yesterday. Tmax 99.3. On fluconazole for Candidal bactermia/MV endocarditis. On Cefepime and Linezolid x 7d for HCAP.  Expected postop ABLA: H/H 11.9/35.1. Thrombocytopenia, plt 133,000. Much improved.    DVT Prophylaxis:  No lovenox due to thrombocytopenia.  Dispo: Continue ICU care   LOS: 16 days    [name] Bend, PA-C 01/18/2024  Patient seen and examined, agree with above Looks much better today- best she has since prior to surgery Diuresed extensively- will hold this Am to replenish K and Mg In sinus tach- on amiodarone for A fib yesterday Low threshold to start DOAC if A fib recurs but for now will start Lovenox  Covey Baller C. Kerrin,  MD [facility] [phone]

## 2024-01-18 NOTE — Progress Notes (Signed)
 Physical Therapy Treatment Patient Details Name: Tracy Baker MRN: [id number] DOB: 07-04-1955 Today's Date: 01/18/2024   History of Present Illness 68 y.o. female presents to [facility] hospital on 12/29/2023 with after leaving AMA from [facility] where she was being treated for C. Albicans candidemia. TEE on 10/28 positive for vegetation of mitral valve. Extraction of multiple teeth on 11/3. Mitral valve repair on 11/6. PMH includes thyroid  disease.    PT Comments  Progressing towards acute functional goals, able to transfer with min assist from recliner, RW to steady. Min assist to ambulate up to 65 feet with single standing rest break and support for balance and RW with intermittent instability due to scissoring and drifting. VSS with HR into 90s while ambulating. SpO2 mid to upper 90s on RA. Required min assist for LE support and cues for technique with bed mobility. Patient will continue to benefit from skilled physical therapy services to further improve independence with functional mobility. Reviewed sternal precautions with good recall and carryover throughout session today.     If plan is discharge home, recommend the following: Assistance with cooking/housework;Assist for transportation;Help with stairs or ramp for entrance;A little help with walking and/or transfers;A little help with bathing/dressing/bathroom   Can travel by private vehicle        Equipment Recommendations  Rolling walker (2 wheels);BSC/3in1    Recommendations for Other Services       Precautions / Restrictions Precautions Precautions: Fall;Other (comment);Sternal Precaution Booklet Issued: Yes (comment) Recall of Precautions/Restrictions: Intact Precaution/Restrictions Comments: Reviewed precautions. Restrictions Other Position/Activity Restrictions: Cardiac sternal precautions     Mobility  Bed Mobility Overal bed mobility: Needs Assistance Bed Mobility: Rolling, Sit to Sidelying Rolling: Contact guard assist        Sit to sidelying: Min assist, HOB elevated General bed mobility comments: Min assist for LE support into bed. Cues for technique, holding hear pillow for precautions and comfort. able to roll on her own.    Transfers Overall transfer level: Needs assistance Equipment used: Rolling walker (2 wheels) Transfers: Sit to/from Stand Sit to Stand: Min assist           General transfer comment: Min assist for boost to stand from recliner. Holding heart pillow. Transitions to RW with light support for balance.    Ambulation/Gait Ambulation/Gait assistance: Min assist Gait Distance (Feet): 65 Feet Assistive device: Rolling walker (2 wheels) Gait Pattern/deviations: Step-through pattern, Decreased stride length, Drifts right/left, Narrow base of support, Scissoring Gait velocity: decreased Gait velocity interpretation: <1.31 ft/sec, indicative of household ambulator   General Gait Details: Intermittent min assist for balance and RW control. Occasional scissoring and LOB but easily corrected with light support from therapist. Cues for awareness, to widen BOS, and maintain close proximity to device for maximum support. No buckling. Fairly fatigued by end of distance. One brief standing rest break at midpoint. Maintains sternal precautions safely   Stairs             Wheelchair Mobility     Tilt Bed    Modified Rankin (Stroke Patients Only)       Balance Overall balance assessment: Needs assistance Sitting-balance support: No upper extremity supported, Feet supported Sitting balance-Leahy Scale: Fair     Standing balance support: Bilateral upper extremity supported, During functional activity, Reliant on assistive device for balance Standing balance-Leahy Scale: Poor Standing balance comment: Reliant on RW  Communication Communication Communication: No apparent difficulties Factors Affecting Communication: Reduced clarity of  speech  Cognition Arousal: Alert Behavior During Therapy: WFL for tasks assessed/performed   PT - Cognitive impairments: Problem solving, Safety/Judgement                         Following commands: Intact      Cueing Cueing Techniques: Verbal cues, Gestural cues  Exercises      General Comments General comments (skin integrity, edema, etc.): HR 88, SpO2 96% on RA, BP 104/75. HR into 90s with exertion      Pertinent Vitals/Pain Pain Assessment Pain Assessment: No/denies pain Pain Intervention(s): Monitored during session    Home Living                          Prior Function            PT Goals (current goals can now be found in the care plan section) Acute Rehab PT Goals Patient Stated Goal: to return to work PT Goal Formulation: With patient Time For Goal Achievement: 01/27/24 Potential to Achieve Goals: Fair Progress towards PT goals: Progressing toward goals    Frequency    Min 2X/week      PT Plan      Co-evaluation              AM-PAC PT 6 Clicks Mobility   Outcome Measure  Help needed turning from your back to your side while in a flat bed without using bedrails?: A Little Help needed moving from lying on your back to sitting on the side of a flat bed without using bedrails?: A Little Help needed moving to and from a bed to a chair (including a wheelchair)?: A Little Help needed standing up from a chair using your arms (e.g., wheelchair or bedside chair)?: A Little Help needed to walk in hospital room?: A Little Help needed climbing 3-5 steps with a railing? : Total 6 Click Score: 16    End of Session   Activity Tolerance: Patient tolerated treatment well Patient left: with call bell/phone within reach;in bed;with bed alarm set;with SCD's reapplied Nurse Communication: Mobility status;Precautions PT Visit Diagnosis: Other abnormalities of gait and mobility (R26.89);Muscle weakness (generalized) (M62.81);Difficulty in  walking, not elsewhere classified (R26.2);Pain     Time: 1202-1223 PT Time Calculation (min) (ACUTE ONLY): 21 min  Charges:    $Gait Training: 8-22 mins PT General Charges $$ ACUTE PT VISIT: 1 Visit                     [name], PT, DPT [facility]  Rehabilitation Services Physical Therapist Office: [phone] Website: [email or web address]    [name] 01/18/2024, 1:48 PM

## 2024-01-18 NOTE — Progress Notes (Addendum)
 Patient ID: Tracy Baker, female   DOB: 1955/12/28, 67 y.o.   MRN: [id number]     Advanced Heart Failure Rounding Note  Cardiologist: Lonni Cash, MD  Chief Complaint: S/p MV Repair / MV endocarditis   Patient Profile   Tracy Baker is a 68 y.o. female with hx of recent nephrolithiasis s/p ureteral stent and lithotripsy at [facility] 10/10 with stent removal. 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 Post-op course c/b post cardiotomy shock, junctional bradycardia, afib w/ RVR and HAP.   Subjective:    POD #8 s/p MV repair for candidal MV endocarditis.   Failed milrinone wean yesterday, restarted. Continues on 0.25 mcg/kg/min. Co-ox improved 62%.   Diuresing on lasix gtt, 15mg /hr. 6.2L in UOP yesterday, net negative 4L. CVP down to 4 today.  SCr stable 0.96, K 2.9, Mg 1.5.  On cefepime+ linezolid for PNA. WBC 14>>11K. AF  Sitting up in chair. Feels tired. Just ambulated w/ PT. Breathing improved since yesterday. Feels nauseated this morning.    Objective:   Weight Range: 72.3 kg Body mass index is 27.36 kg/m.   Vital Signs:   Temp:  [96.8 F (36 C)-99.3 F (37.4 C)] 98.8 F (37.1 C) (11/14 0700) Pulse Rate:  [54-116] 92 (11/14 0700) Resp:  [10-30] 13 (11/14 0700) BP: (77-164)/(52-107) 111/61 (11/14 0700) SpO2:  [91 %-98 %] 97 % (11/14 0700) Weight:  [72.3 kg] 72.3 kg (11/14 0500) Last BM Date : 01/16/24  Weight change: Filed Weights   01/16/24 0512 01/17/24 0445 01/18/24 0500  Weight: 76.5 kg 76.1 kg 72.3 kg    Intake/Output:   Intake/Output Summary (Last 24 hours) at 01/18/2024 0743 Last data filed at 01/18/2024 0700 Gross per 24 hour  Intake 2199.29 ml  Output 6245 ml  Net -4045.71 ml    Physical Exam   Vitals:   01/18/24 0645 01/18/24 0700  BP: 118/67 111/61  Pulse: 96 92  Resp: 16 13  Temp:  98.8 F (37.1 C)  SpO2: 98% 97%   GENERAL: fatigued appearing, NAD Lungs- diminished at bases  CARDIAC:  JVP not  elevated         Tachy rate, regular rhythm. No MRG. No LEE  ABDOMEN: Soft, non-tender, non-distended.  EXTREMITIES: Warm and well perfused. + RUE PICC  NEUROLOGIC: No obvious FND GU: + foley   Telemetry   Sinus tach 110s, no further bradycardia (Personally reviewed)    Labs    CBC Recent Labs    01/17/24 0453 01/18/24 0454  WBC 14.3* 11.5*  HGB 11.2* 11.9*  HCT 34.4* 35.1*  MCV 94.0 92.4  PLT 120* 133*   Basic Metabolic Panel Recent Labs    88/86/74 0453 01/17/24 1447 01/18/24 0454  NA 137 137 138  K 3.9 3.8 2.9*  CL 106 100 97*  CO2 18* 21* 26  GLUCOSE 138* 239* 209*  BUN 30* 27* 23  CREATININE 1.00 0.98 0.96  CALCIUM 8.8* 8.3* 8.5*  MG 1.9  --  1.5*   Liver Function Tests Recent Labs    01/17/24 0453 01/18/24 0454  AST 341* 155*  ALT 337* 244*  ALKPHOS 212* 194*  BILITOT 2.1* 2.7*  PROT 6.1* 5.7*  ALBUMIN 2.9* 2.7*   Medications:   Scheduled Medications:  aspirin EC  325 mg Oral Daily   Chlorhexidine Gluconate Cloth  6 each Topical Daily   docusate sodium  200 mg Oral Daily   feeding supplement  237 mL Oral BID BM   insulin aspart  0-15 Units Subcutaneous TID WC   insulin aspart  0-5 Units Subcutaneous QHS   pantoprazole  (PROTONIX ) IV  40 mg Intravenous Daily   scopolamine  2 patch Transdermal Q72H   sodium chloride  flush  10-40 mL Intracatheter Q12H   spironolactone  25 mg Oral Daily    Infusions:  amiodarone 30 mg/hr (01/18/24 0700)   ceFEPime (MAXIPIME) IV Stopped (01/17/24 2143)   fluconazole (DIFLUCAN) IV Stopped (01/17/24 1314)   furosemide (LASIX) 200 mg in dextrose 5 % 100 mL (2 mg/mL) infusion 15 mg/hr (01/18/24 0700)   linezolid (ZYVOX) IV Stopped (01/17/24 2247)   magnesium sulfate bolus IVPB     milrinone 0.25 mcg/kg/min (01/18/24 0700)   potassium chloride 10 mEq (01/18/24 0731)    PRN Medications: bisacodyl **OR** [DISCONTINUED] bisacodyl, fentaNYL  (SUBLIMAZE ) injection, HYDROmorphone, LORazepam, mouth rinse,  prochlorperazine, sodium chloride  flush   Assessment/Plan   1. MV Endocarditis d/t Candida Albicans candidemia/fungemia, S/p MV repair w/ Post-cardiotomy Shock  - s/p MV repair 01/10/24 by Dr. Kerrin - Pre-op [facility] 01/04/24 with no CAD and normal L/R heart pressures.  - Intra-op TEE LVEF 60-65%, RV mildly reduced, small immobile mass on anterior valve leaflet, mod MR, trivial TR - Failed initial Milrinone wean. Repeat Limited Echo 11/13 showed mild-moderately reduced RV. LVEF nl  - Back on Milrinone 0.25 mcg/kg/min. Co-ox 62% today - Diuresed well w/ Lasix gtt yesterday. CVP down to 4 today - Stop Lasix gtt. Give aggressive K and Mg supp.  - Monitor CVP closely throughout the day. If increases can give additional Lasix in PM  - Continue spiro 25 mg daily - slow milrinone wean. Would continue current rate through today, can reduce to 0.125 tomorrow if stable.   2. Jess Cleaves, (Afib w/ RVR + Transient Junctional Cleaves)  - continues on amio gtt w/ EPW back up rate set to 40 bmp - currently ST, 110s. No further bradycardia - supp K and Mg  - monitor on tele  3. HAP  - covering w/ cefepime + linezolid  - WBC improving   4. Hx of nephrolithiasis  - s/p ureteral stent and lithotripsy   5. ID - On fluconazole for candidal bacteremia/MV endocarditis. Plan for fluconazole PO at discharge  6. Elevated LFTs - Marked jump in LFTs 11/8. Significant improvement. Continues to trend down    7. Hypokalemia/ Hypomagnesemia  - K 2.9, Mg 1.5 - give aggressive IV K and Mg supp, d/w pharmD    CRITICAL CARE Performed by: Caffie Shed   Total critical care time: 15 minutes  Critical care time was exclusive of separately billable procedures and treating other patients.  Critical care was necessary to treat or prevent imminent or life-threatening deterioration.  Critical care was time spent personally by me on the following activities: development of treatment plan with patient  and/or surrogate as well as nursing, discussions with consultants, evaluation of patient's response to treatment, examination of patient, obtaining history from patient or surrogate, ordering and performing treatments and interventions, ordering and review of laboratory studies, ordering and review of radiographic studies, pulse oximetry and re-evaluation of patient's condition.   Caffie Shed, PA-C 01/18/2024  Patient seen with PA, I formulated the plan and agree with the above note.   She diuresed well with I/Os net negative 4046, weight down 8 lbs and CVP < 5 today.  She remains on milrinone 0.25.  She looks better today.   I reviewed yesterday's echo, by my evaluation she has EF 60-65%, moderate RV dysfunction, s/p  MV repair with probably moderate mitral regurgitation.    She is back in atrial fibrillation this morning, on amiodarone gtt 30 mg/hr.   She continues on linezolid/cefepime for possible HAP, on fluconazole for candidal endocarditis.   General: NAD Neck: No JVD, no thyromegaly or thyroid  nodule.  Lungs: Clear to auscultation bilaterally with normal respiratory effort. CV: Nondisplaced PMI.  Heart irregular S1/S2, no S3/S4,1/6 HSM apex.  1+ ankle edema.  Abdomen: Soft, nontender, no hepatosplenomegaly, no distention.  Skin: Intact without lesions or rashes.  Neurologic: Alert and oriented x 3.  Psych: Normal affect. Extremities: No clubbing or cyanosis.  HEENT: Normal.   RV failure post-op with residual moderate mitral regurgitation.  She diuresed well yesterday, now with CVP < 5 and breathing better.  Milrinone 0.25 with co-ox 62%.  - Stop Lasix gtt, start torsemide 60 mg daily.  - Slow wean of milrinone, will decrease to 0.125 mcg/kg/min today.   She remains in AF with HR 90s-100s this morning.  Continue amiodarone gtt.  Will discuss timing of initiation of Eliquis with Dr. Kerrin.   Empiric coverage for HAP as above, though suspect main issue was pulmonary  edema.  Complete 5 days.   Continue fluconazole for candidemia.   CRITICAL CARE Performed by: Ezra Shuck  Total critical care time: 40 minutes  Critical care time was exclusive of separately billable procedures and treating other patients.  Critical care was necessary to treat or prevent imminent or life-threatening deterioration.  Critical care was time spent personally by me on the following activities: development of treatment plan with patient and/or surrogate as well as nursing, discussions with consultants, evaluation of patient's response to treatment, examination of patient, obtaining history from patient or surrogate, ordering and performing treatments and interventions, ordering and review of laboratory studies, ordering and review of radiographic studies, pulse oximetry and re-evaluation of patient's condition.  Ezra Shuck 01/18/2024 10:48 AM

## 2024-01-19 ENCOUNTER — Inpatient Hospital Stay (HOSPITAL_COMMUNITY)

## 2024-01-19 DIAGNOSIS — I4891 Unspecified atrial fibrillation: Secondary | ICD-10-CM

## 2024-01-19 DIAGNOSIS — B377 Candidal sepsis: Secondary | ICD-10-CM | POA: Diagnosis not present

## 2024-01-19 LAB — COMPREHENSIVE METABOLIC PANEL WITH GFR
ALT: 169 U/L — ABNORMAL HIGH (ref 0–44)
AST: 93 U/L — ABNORMAL HIGH (ref 15–41)
Albumin: 2.6 g/dL — ABNORMAL LOW (ref 3.5–5.0)
Alkaline Phosphatase: 162 U/L — ABNORMAL HIGH (ref 38–126)
Anion gap: 15 (ref 5–15)
BUN: 16 mg/dL (ref 8–23)
CO2: 25 mmol/L (ref 22–32)
Calcium: 8.2 mg/dL — ABNORMAL LOW (ref 8.9–10.3)
Chloride: 93 mmol/L — ABNORMAL LOW (ref 98–111)
Creatinine, Ser: 1.14 mg/dL — ABNORMAL HIGH (ref 0.44–1.00)
GFR, Estimated: 52 mL/min — ABNORMAL LOW (ref >60)
Glucose, Bld: 234 mg/dL — ABNORMAL HIGH (ref 70–99)
Potassium: 3.7 mmol/L (ref 3.5–5.1)
Sodium: 133 mmol/L — ABNORMAL LOW (ref 135–145)
Total Bilirubin: 1.9 mg/dL — ABNORMAL HIGH (ref 0.0–1.2)
Total Protein: 5.6 g/dL — ABNORMAL LOW (ref 6.5–8.1)

## 2024-01-19 LAB — CBC
HCT: 31.9 % — ABNORMAL LOW (ref 36.0–46.0)
Hemoglobin: 10.7 g/dL — ABNORMAL LOW (ref 12.0–15.0)
MCH: 31 pg (ref 26.0–34.0)
MCHC: 33.5 g/dL (ref 30.0–36.0)
MCV: 92.5 fL (ref 80.0–100.0)
Platelets: 131 K/uL — ABNORMAL LOW (ref 150–400)
RBC: 3.45 MIL/uL — ABNORMAL LOW (ref 3.87–5.11)
RDW: 19.9 % — ABNORMAL HIGH (ref 11.5–15.5)
WBC: 10 K/uL (ref 4.0–10.5)
nRBC: 0.4 % — ABNORMAL HIGH (ref 0.0–0.2)

## 2024-01-19 LAB — GLUCOSE, CAPILLARY
Glucose-Capillary: 126 mg/dL — ABNORMAL HIGH (ref 70–99)
Glucose-Capillary: 208 mg/dL — ABNORMAL HIGH (ref 70–99)
Glucose-Capillary: 135 mg/dL — ABNORMAL HIGH (ref 70–99)
Glucose-Capillary: 122 mg/dL — ABNORMAL HIGH (ref 70–99)

## 2024-01-19 LAB — COOXEMETRY PANEL
Carboxyhemoglobin: 0.9 % (ref 0.5–1.5)
Methemoglobin: 1.4 % (ref 0.0–1.5)
O2 Saturation: 55.7 %
Total hemoglobin: 9.5 g/dL — ABNORMAL LOW (ref 12.0–16.0)

## 2024-01-19 LAB — MAGNESIUM: Magnesium: 1.8 mg/dL (ref 1.7–2.4)

## 2024-01-19 MED ORDER — POTASSIUM CHLORIDE 10 MEQ/50ML IV SOLN: 10.0000 meq | INTRAVENOUS | Status: DC

## 2024-01-19 MED ORDER — APIXABAN 5 MG PO TABS
5.0000 mg | ORAL_TABLET | Freq: Two times a day (BID) | ORAL | Status: DC
  Administered 2024-01-19 – 2024-02-03 (×30): 5 mg via ORAL
  Filled 2024-01-19 (×30): qty 1

## 2024-01-19 MED ORDER — MAGNESIUM SULFATE 2 GM/50ML IV SOLN
2.0000 g | Freq: Once | INTRAVENOUS | Status: AC
  Administered 2024-01-19: 2 g via INTRAVENOUS
  Filled 2024-01-19: qty 50

## 2024-01-19 MED ORDER — POTASSIUM CHLORIDE 10 MEQ/50ML IV SOLN
10.0000 meq | INTRAVENOUS | Status: AC
  Administered 2024-01-19 (×6): 10 meq via INTRAVENOUS
  Filled 2024-01-19 (×6): qty 50

## 2024-01-19 MED ORDER — DIGOXIN 125 MCG PO TABS
0.1250 mg | ORAL_TABLET | Freq: Every day | ORAL | Status: DC
  Administered 2024-01-19 – 2024-01-24 (×6): 0.125 mg via ORAL
  Filled 2024-01-19 (×6): qty 1

## 2024-01-19 NOTE — Progress Notes (Signed)
 [address] [employer] [address]             [phone]                9 Days Post-Op Procedure(s) (LRB): REPAIR, MITRAL VALVE (N/A) ECHOCARDIOGRAM, TRANSESOPHAGEAL, INTRAOPERATIVE (N/A)   Events: Co-ox low Remains in afib _______________________________________________________________ Vitals: BP (!) 116/58 (BP Location: Left Arm)   Pulse 83   Temp 97.8 F (36.6 C) (Axillary)   Resp 15   Ht 5' 4 (1.626 m)   Wt 71.5 kg   SpO2 97%   BMI 27.06 kg/m  Filed Weights   01/17/24 0445 01/18/24 0500 01/19/24 0500  Weight: 76.1 kg 72.3 kg 71.5 kg     - Neuro: alert NAD  - Cardiovascular: afib  Drips: amio 30, milr 0.125.   CVP:  [3 mmHg-5 mmHg] 4 mmHg  - Pulm: EWOB    ABG    Component Value Date/Time   PHART 7.387 01/11/2024 0856   PCO2ART 32.1 01/11/2024 0856   PO2ART 64 (L) 01/11/2024 0856   HCO3 19.3 (L) 01/11/2024 0856   TCO2 20 (L) 01/11/2024 0856   ACIDBASEDEF 5.0 (H) 01/11/2024 0856   O2SAT 55.7 01/19/2024 0454    - Abd: ND - Extremity: trace edema  .Intake/Output      11/14 0701 11/15 0700 11/15 0701 11/16 0700   I.V. (mL/kg) 486.6 (6.8) 19.5 (0.3)   IV Piggyback 1599.9    Total Intake(mL/kg) 2086.5 (29.2) 19.5 (0.3)   Urine (mL/kg/hr) 2962 (1.7) 255 (1.4)   Stool     Total Output 2962 255   Net -875.5 -235.5           _______________________________________________________________ Labs:    Latest Ref Rng & Units 01/19/2024    4:56 AM 01/18/2024    4:54 AM 01/17/2024    4:53 AM  CBC  WBC 4.0 - 10.5 K/uL 10.0  11.5  14.3   Hemoglobin 12.0 - 15.0 g/dL 89.2  88.0  88.7   Hematocrit 36.0 - 46.0 % 31.9  35.1  34.4   Platelets 150 - 400 K/uL 131  133  120       Latest Ref Rng & Units 01/19/2024    4:56 AM 01/18/2024    3:50 PM 01/18/2024    4:54 AM  CMP  Glucose 70 - 99 mg/dL 765  785  790   BUN 8 - 23 mg/dL 16  21  23    Creatinine 0.44 - 1.00 mg/dL 8.85  8.97  9.03   Sodium 135 - 145 mmol/L 133  133   138   Potassium 3.5 - 5.1 mmol/L 3.7  3.3  2.9   Chloride 98 - 111 mmol/L 93  96  97   CO2 22 - 32 mmol/L 25  24  26    Calcium 8.9 - 10.3 mg/dL 8.2  8.3  8.5   Total Protein 6.5 - 8.1 g/dL 5.6   5.7   Total Bilirubin 0.0 - 1.2 mg/dL 1.9   2.7   Alkaline Phos 38 - 126 U/L 162   194   AST 15 - 41 U/L 93   155   ALT 0 - 44 U/L 169   244     CXR: stable  _______________________________________________________________  Assessment and Plan: POD 9 s/p MVR  Neuro: pain controlled CV: appreciate AHF.  Remains on milr.  Will start anticoagulation for afib today. Pulm: IS, ambulation Renal: creat  stable GI: on diet Heme: stabl ID: afebrile Endo: SSI Dispo: continue ICU care.   Tracy Baker 01/19/2024 9:36 AM

## 2024-01-19 NOTE — Plan of Care (Signed)
  Problem: Education: Goal: Knowledge of General Education information will improve Description: Including pain rating scale, medication(s)/side effects and non-pharmacologic comfort measures Outcome: Progressing   Problem: Clinical Measurements: Goal: Diagnostic test results will improve Outcome: Progressing Goal: Respiratory complications will improve Outcome: Progressing   Problem: Nutrition: Goal: Adequate nutrition will be maintained Outcome: Not Progressing   Problem: Elimination: Goal: Will not experience complications related to bowel motility Outcome: Progressing Goal: Will not experience complications related to urinary retention Outcome: Progressing   Problem: Pain Managment: Goal: General experience of comfort will improve and/or be controlled Outcome: Progressing   Problem: Education: Goal: Understanding of CV disease, CV risk reduction, and recovery process will improve Outcome: Progressing   Problem: Cardiovascular: Goal: Ability to achieve and maintain adequate cardiovascular perfusion will improve Outcome: Progressing Goal: Vascular access site(s) Level 0-1 will be maintained Outcome: Progressing

## 2024-01-19 NOTE — Progress Notes (Signed)
 Patient ID: Tracy Baker, female   DOB: 1955-03-22, 68 y.o.   MRN: [id number]     Advanced Heart Failure Rounding Note  Cardiologist: Lonni Cash, MD  Chief Complaint: S/p MV Repair / MV endocarditis   Patient Profile   Tracy Baker is a 68 y.o. female with hx of recent nephrolithiasis s/p ureteral stent and lithotripsy at [facility] 10/10 with stent removal. 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 Post-op course c/b post cardiotomy shock, junctional bradycardia, afib w/ RVR and HAP.   Subjective:    POD #9 s/p MV repair for candidal MV endocarditis.   She remains on milrinone 0.125, co-ox 56%.  CVP 6, now on oral torsemide.  Weight down another 2 lbs.    On cefepime+ linezolid empirically for aspiration PNA. WBC 14>>11>>10K. Afebrile.   She remains in AF in 80s, on amiodarone gtt 30 mg/hr.   Sitting up in chair. Feels tired. Walked to door today.    Objective:   Weight Range: 71.5 kg Body mass index is 27.06 kg/m.   Vital Signs:   Temp:  [96.6 F (35.9 C)-97.8 F (36.6 C)] 97.8 F (36.6 C) (11/15 0829) Pulse Rate:  [76-88] 83 (11/15 0800) Resp:  [10-20] 15 (11/15 0800) BP: (87-121)/(50-102) 116/58 (11/15 0800) SpO2:  [91 %-98 %] 97 % (11/15 0800) Weight:  [71.5 kg] 71.5 kg (11/15 0500) Last BM Date : 01/18/24  Weight change: Filed Weights   01/17/24 0445 01/18/24 0500 01/19/24 0500  Weight: 76.1 kg 72.3 kg 71.5 kg    Intake/Output:   Intake/Output Summary (Last 24 hours) at 01/19/2024 0935 Last data filed at 01/19/2024 0800 Gross per 24 hour  Intake 2007.61 ml  Output 2917 ml  Net -909.39 ml    Physical Exam   Vitals:   01/19/24 0800 01/19/24 0829  BP: (!) 116/58   Pulse: 83   Resp: 15   Temp:  97.8 F (36.6 C)  SpO2: 97%    General: NAD Neck: No JVD, no thyromegaly or thyroid  nodule.  Lungs: Clear to auscultation bilaterally with normal respiratory effort. CV: Nondisplaced PMI.  Heart irregular S1/S2, no  S3/S4, no murmur. Trace ankle edema.   Abdomen: Soft, nontender, no hepatosplenomegaly, no distention.  Skin: Intact without lesions or rashes.  Neurologic: Alert and oriented x 3.  Psych: Normal affect. Extremities: No clubbing or cyanosis.  HEENT: Normal.   Telemetry   Atrial fibrillation 80s (Personally reviewed)    Labs    CBC Recent Labs    01/18/24 0454 01/19/24 0456  WBC 11.5* 10.0  HGB 11.9* 10.7*  HCT 35.1* 31.9*  MCV 92.4 92.5  PLT 133* 131*   Basic Metabolic Panel Recent Labs    88/85/74 0454 01/18/24 1550 01/19/24 0456  NA 138 133* 133*  K 2.9* 3.3* 3.7  CL 97* 96* 93*  CO2 26 24 25   GLUCOSE 209* 214* 234*  BUN 23 21 16   CREATININE 0.96 1.02* 1.14*  CALCIUM 8.5* 8.3* 8.2*  MG 1.5*  --  1.8   Liver Function Tests Recent Labs    01/18/24 0454 01/19/24 0456  AST 155* 93*  ALT 244* 169*  ALKPHOS 194* 162*  BILITOT 2.7* 1.9*  PROT 5.7* 5.6*  ALBUMIN 2.7* 2.6*   Medications:   Scheduled Medications:  aspirin EC  325 mg Oral Daily   Chlorhexidine Gluconate Cloth  6 each Topical Daily   digoxin  0.125 mg Oral Daily   docusate sodium  200 mg Oral Daily  enoxaparin (LOVENOX) injection  40 mg Subcutaneous Q24H   feeding supplement  237 mL Oral BID BM   insulin aspart  0-15 Units Subcutaneous TID WC   insulin aspart  0-5 Units Subcutaneous QHS   pantoprazole  (PROTONIX ) IV  40 mg Intravenous Daily   scopolamine  2 patch Transdermal Q72H   sodium chloride  flush  10-40 mL Intracatheter Q12H   spironolactone  25 mg Oral Daily   torsemide  60 mg Oral Daily    Infusions:  amiodarone 30 mg/hr (01/19/24 0800)   ceFEPime (MAXIPIME) IV 2 g (01/19/24 0922)   fluconazole (DIFLUCAN) IV Stopped (01/18/24 1134)   linezolid (ZYVOX) IV 600 mg (01/19/24 0923)   magnesium sulfate bolus IVPB     milrinone 0.125 mcg/kg/min (01/19/24 0800)   potassium chloride      PRN Medications: bisacodyl **OR** [DISCONTINUED] bisacodyl, clonazePAM , fentaNYL  (SUBLIMAZE )  injection, HYDROmorphone, mouth rinse, sodium chloride  flush   Assessment/Plan   1. MV Endocarditis d/t Candida Albicans candidemia/fungemia, S/p MV repair w/ Post-cardiotomy Shock  - s/p MV repair 01/10/24 by Dr. Kerrin - Pre-op [facility] 01/04/24 with no CAD and normal L/R heart pressures.  - Intra-op TEE LVEF 60-65%, RV mildly reduced, small immobile mass on anterior valve leaflet, mod MR, trivial TR - Failed initial Milrinone wean. I reviewed 11/13 echo, by my evaluation she has EF 60-65%, moderate RV dysfunction, s/p MV repair with probably moderate mitral regurgitation.  Milrinone restarted.  - Milrinone now down to 0.125 with co-ox 56% today.  Plan slow wean, will start digoxin 0.125 today for RV and will tentatively plan to stop milrinone tomorrow.  - CVP 6 today.  Continue torsemide 60 mg daily.  - Continue spiro 25 mg daily  2. Transient Junctional Brady => NSR 50s - Now in atrial fibrillation, on amiodarone 30 mg/hr.  - Can remove pacing wires at this point.   3. Atrial fibrillation - Currently in AF rate 80s => on amiodarone gtt 30 mg/hr.  - Discussed with TCTS, will start anticoagulation today.  - If she remains in AF after weaning off milrinone, would aim for TEE-guided DCCV.   4. ?HAP/aspiration  - covering w/ cefepime + linezolid, now day 3/5.  - WBC improving   5. 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   7. Elevated LFTs - Marked jump in LFTs 11/8. Now trending down.    CRITICAL CARE Performed by: Ezra Shuck  Total critical care time: 40 minutes  Critical care time was exclusive of separately billable procedures and treating other patients.  Critical care was necessary to treat or prevent imminent or life-threatening deterioration.  Critical care was time spent personally by me on the following activities: development of treatment plan with patient and/or surrogate as  well as nursing, discussions with consultants, evaluation of patient's response to treatment, examination of patient, obtaining history from patient or surrogate, ordering and performing treatments and interventions, ordering and review of laboratory studies, ordering and review of radiographic studies, pulse oximetry and re-evaluation of patient's condition.  Ezra Shuck 01/19/2024 9:35 AM

## 2024-01-19 NOTE — Plan of Care (Signed)
  Problem: Education: Goal: Knowledge of General Education information will improve Description: Including pain rating scale, medication(s)/side effects and non-pharmacologic comfort measures Outcome: Progressing   Problem: Clinical Measurements: Goal: Ability to maintain clinical measurements within normal limits will improve Outcome: Progressing Goal: Will remain free from infection Outcome: Progressing Goal: Diagnostic test results will improve Outcome: Progressing Goal: Respiratory complications will improve Outcome: Progressing   Problem: Activity: Goal: Risk for activity intolerance will decrease Outcome: Progressing   Problem: Coping: Goal: Level of anxiety will decrease Outcome: Not Progressing

## 2024-01-19 NOTE — Progress Notes (Signed)
 Epicardial pacing wires removed per order, Bedrest begins @ 1300 x 1 hour

## 2024-01-20 DIAGNOSIS — B377 Candidal sepsis: Secondary | ICD-10-CM | POA: Diagnosis not present

## 2024-01-20 LAB — COMPREHENSIVE METABOLIC PANEL WITH GFR
ALT: 131 U/L — ABNORMAL HIGH (ref 0–44)
AST: 70 U/L — ABNORMAL HIGH (ref 15–41)
Albumin: 2.7 g/dL — ABNORMAL LOW (ref 3.5–5.0)
Alkaline Phosphatase: 147 U/L — ABNORMAL HIGH (ref 38–126)
Anion gap: 15 (ref 5–15)
BUN: 16 mg/dL (ref 8–23)
CO2: 27 mmol/L (ref 22–32)
Calcium: 8.2 mg/dL — ABNORMAL LOW (ref 8.9–10.3)
Chloride: 92 mmol/L — ABNORMAL LOW (ref 98–111)
Creatinine, Ser: 1.09 mg/dL — ABNORMAL HIGH (ref 0.44–1.00)
GFR, Estimated: 55 mL/min — ABNORMAL LOW (ref >60)
Glucose, Bld: 159 mg/dL — ABNORMAL HIGH (ref 70–99)
Potassium: 3.5 mmol/L (ref 3.5–5.1)
Sodium: 134 mmol/L — ABNORMAL LOW (ref 135–145)
Total Bilirubin: 2 mg/dL — ABNORMAL HIGH (ref 0.0–1.2)
Total Protein: 5.7 g/dL — ABNORMAL LOW (ref 6.5–8.1)

## 2024-01-20 LAB — GLUCOSE, CAPILLARY
Glucose-Capillary: 165 mg/dL — ABNORMAL HIGH (ref 70–99)
Glucose-Capillary: 139 mg/dL — ABNORMAL HIGH (ref 70–99)
Glucose-Capillary: 108 mg/dL — ABNORMAL HIGH (ref 70–99)
Glucose-Capillary: 124 mg/dL — ABNORMAL HIGH (ref 70–99)

## 2024-01-20 LAB — COOXEMETRY PANEL
Carboxyhemoglobin: 0.6 % (ref 0.5–1.5)
Carboxyhemoglobin: 1.5 % (ref 0.5–1.5)
Carboxyhemoglobin: 1.8 % — ABNORMAL HIGH (ref 0.5–1.5)
Methemoglobin: 1.3 % (ref 0.0–1.5)
Methemoglobin: <0.7 % (ref 0.0–1.5)
Methemoglobin: 1.6 % — ABNORMAL HIGH (ref 0.0–1.5)
O2 Saturation: 45.2 %
O2 Saturation: 46.6 %
O2 Saturation: 40.3 %
Total hemoglobin: 11.2 g/dL — ABNORMAL LOW (ref 12.0–16.0)
Total hemoglobin: 11.6 g/dL — ABNORMAL LOW (ref 12.0–16.0)
Total hemoglobin: 11.3 g/dL — ABNORMAL LOW (ref 12.0–16.0)

## 2024-01-20 LAB — CBC
HCT: 31.7 % — ABNORMAL LOW (ref 36.0–46.0)
Hemoglobin: 10.3 g/dL — ABNORMAL LOW (ref 12.0–15.0)
MCH: 30.7 pg (ref 26.0–34.0)
MCHC: 32.5 g/dL (ref 30.0–36.0)
MCV: 94.6 fL (ref 80.0–100.0)
Platelets: 124 K/uL — ABNORMAL LOW (ref 150–400)
RBC: 3.35 MIL/uL — ABNORMAL LOW (ref 3.87–5.11)
RDW: 20.7 % — ABNORMAL HIGH (ref 11.5–15.5)
WBC: 7.5 K/uL (ref 4.0–10.5)
nRBC: 0.3 % — ABNORMAL HIGH (ref 0.0–0.2)

## 2024-01-20 LAB — BASIC METABOLIC PANEL WITH GFR
Anion gap: 14 (ref 5–15)
BUN: 17 mg/dL (ref 8–23)
CO2: 28 mmol/L (ref 22–32)
Calcium: 8.2 mg/dL — ABNORMAL LOW (ref 8.9–10.3)
Chloride: 90 mmol/L — ABNORMAL LOW (ref 98–111)
Creatinine, Ser: 1.24 mg/dL — ABNORMAL HIGH (ref 0.44–1.00)
GFR, Estimated: 47 mL/min — ABNORMAL LOW (ref >60)
Glucose, Bld: 170 mg/dL — ABNORMAL HIGH (ref 70–99)
Potassium: 3.9 mmol/L (ref 3.5–5.1)
Sodium: 132 mmol/L — ABNORMAL LOW (ref 135–145)

## 2024-01-20 LAB — MAGNESIUM: Magnesium: 1.9 mg/dL (ref 1.7–2.4)

## 2024-01-20 MED ORDER — POTASSIUM CHLORIDE CRYS ER 20 MEQ PO TBCR
30.0000 meq | EXTENDED_RELEASE_TABLET | ORAL | Status: AC
  Administered 2024-01-20 (×2): 30 meq via ORAL
  Filled 2024-01-20 (×2): qty 1

## 2024-01-20 MED ORDER — POTASSIUM CHLORIDE 20 MEQ PO PACK
20.0000 meq | PACK | ORAL | Status: DC
  Administered 2024-01-20 (×2): 20 meq
  Filled 2024-01-20 (×2): qty 1

## 2024-01-20 MED ORDER — MAGNESIUM SULFATE 2 GM/50ML IV SOLN
2.0000 g | Freq: Once | INTRAVENOUS | Status: AC
  Administered 2024-01-20: 2 g via INTRAVENOUS
  Filled 2024-01-20: qty 50

## 2024-01-20 MED ORDER — POTASSIUM CHLORIDE 10 MEQ/50ML IV SOLN
10.0000 meq | INTRAVENOUS | Status: DC
  Filled 2024-01-20 (×4): qty 50

## 2024-01-20 MED ORDER — FUROSEMIDE 10 MG/ML IJ SOLN
80.0000 mg | Freq: Two times a day (BID) | INTRAMUSCULAR | Status: AC
  Administered 2024-01-20 (×2): 80 mg via INTRAVENOUS
  Filled 2024-01-20 (×2): qty 8

## 2024-01-20 NOTE — Progress Notes (Signed)
 EVENING ROUNDS NOTE :     [address] [employer] [address]             [phone]               10 Days Post-Op Procedure(s) (LRB): REPAIR, MITRAL VALVE (N/A) ECHOCARDIOGRAM, TRANSESOPHAGEAL, INTRAOPERATIVE (N/A)   Total Length of Stay:  LOS: 18 days  Events:   No events     BP (!) 106/56   Pulse 78   Temp 97.9 F (36.6 C) (Oral)   Resp 14   Ht 5' 4 (1.626 m)   Wt 69.4 kg   SpO2 91%   BMI 26.26 kg/m   CVP:  [7 mmHg-10 mmHg] 7 mmHg      amiodarone 30 mg/hr (01/20/24 1900)   ceFEPime (MAXIPIME) IV Stopped (01/20/24 0944)   fluconazole (DIFLUCAN) IV Stopped (01/20/24 1204)   linezolid (ZYVOX) IV Stopped (01/20/24 1018)   milrinone 0.25 mcg/kg/min (01/20/24 1900)    I/O last 3 completed shifts: In: 2473.5 [I.V.:773.6; IV Piggyback:1699.9] Out: 4920 [Urine:4920]      Latest Ref Rng & Units 01/20/2024    4:36 AM 01/19/2024    4:56 AM 01/18/2024    4:54 AM  CBC  WBC 4.0 - 10.5 K/uL 7.5  10.0  11.5   Hemoglobin 12.0 - 15.0 g/dL 89.6  89.2  88.0   Hematocrit 36.0 - 46.0 % 31.7  31.9  35.1   Platelets 150 - 400 K/uL 124  131  133        Latest Ref Rng & Units 01/20/2024   12:34 PM 01/20/2024    4:36 AM 01/19/2024    4:56 AM  BMP  Glucose 70 - 99 mg/dL 829  840  765   BUN 8 - 23 mg/dL 17  16  16    Creatinine 0.44 - 1.00 mg/dL 8.75  8.90  8.85   Sodium 135 - 145 mmol/L 132  134  133   Potassium 3.5 - 5.1 mmol/L 3.9  3.5  3.7   Chloride 98 - 111 mmol/L 90  92  93   CO2 22 - 32 mmol/L 28  27  25    Calcium 8.9 - 10.3 mg/dL 8.2  8.2  8.2     ABG    Component Value Date/Time   PHART 7.387 01/11/2024 0856   PCO2ART 32.1 01/11/2024 0856   PO2ART 64 (L) 01/11/2024 0856   HCO3 19.3 (L) 01/11/2024 0856   TCO2 20 (L) 01/11/2024 0856   ACIDBASEDEF 5.0 (H) 01/11/2024 0856   O2SAT 40.3 01/20/2024 1234       Linnie Rayas, MD 01/20/2024 7:15 PM

## 2024-01-20 NOTE — Plan of Care (Signed)
 Problem: Education: Goal: Knowledge of General Education information will improve Description: Including pain rating scale, medication(s)/side effects and non-pharmacologic comfort measures Outcome: Progressing   Problem: Health Behavior/Discharge Planning: Goal: Ability to manage health-related needs will improve Outcome: Progressing   Problem: Clinical Measurements: Goal: Ability to maintain clinical measurements within normal limits will improve Outcome: Progressing Goal: Will remain free from infection Outcome: Progressing Goal: Diagnostic test results will improve Outcome: Progressing Goal: Respiratory complications will improve Outcome: Progressing Goal: Cardiovascular complication will be avoided Outcome: Progressing   Problem: Activity: Goal: Risk for activity intolerance will decrease Outcome: Progressing   Problem: Nutrition: Goal: Adequate nutrition will be maintained Outcome: Progressing   Problem: Coping: Goal: Level of anxiety will decrease Outcome: Progressing   Problem: Elimination: Goal: Will not experience complications related to bowel motility Outcome: Progressing Goal: Will not experience complications related to urinary retention Outcome: Progressing   Problem: Pain Managment: Goal: General experience of comfort will improve and/or be controlled Outcome: Progressing   Problem: Safety: Goal: Ability to remain free from injury will improve Outcome: Progressing   Problem: Skin Integrity: Goal: Risk for impaired skin integrity will decrease Outcome: Progressing   Problem: Education: Goal: Understanding of CV disease, CV risk reduction, and recovery process will improve Outcome: Progressing Goal: Individualized Educational Video(s) Outcome: Progressing   Problem: Activity: Goal: Ability to return to baseline activity level will improve Outcome: Progressing   Problem: Cardiovascular: Goal: Ability to achieve and maintain adequate  cardiovascular perfusion will improve Outcome: Progressing Goal: Vascular access site(s) Level 0-1 will be maintained Outcome: Progressing   Problem: Health Behavior/Discharge Planning: Goal: Ability to safely manage health-related needs after discharge will improve Outcome: Progressing   Problem: Education: Goal: Will demonstrate proper wound care and an understanding of methods to prevent future damage Outcome: Progressing Goal: Knowledge of disease or condition will improve Outcome: Progressing Goal: Knowledge of the prescribed therapeutic regimen will improve Outcome: Progressing Goal: Individualized Educational Video(s) Outcome: Progressing   Problem: Activity: Goal: Risk for activity intolerance will decrease Outcome: Progressing   Problem: Cardiac: Goal: Will achieve and/or maintain hemodynamic stability Outcome: Progressing   Problem: Clinical Measurements: Goal: Postoperative complications will be avoided or minimized Outcome: Progressing   Problem: Respiratory: Goal: Respiratory status will improve Outcome: Progressing   Problem: Skin Integrity: Goal: Wound healing without signs and symptoms of infection Outcome: Progressing Goal: Risk for impaired skin integrity will decrease Outcome: Progressing   Problem: Urinary Elimination: Goal: Ability to achieve and maintain adequate renal perfusion and functioning will improve Outcome: Progressing   Problem: Education: Goal: Ability to describe self-care measures that may prevent or decrease complications (Diabetes Survival Skills Education) will improve Outcome: Progressing Goal: Individualized Educational Video(s) Outcome: Progressing   Problem: Coping: Goal: Ability to adjust to condition or change in health will improve Outcome: Progressing   Problem: Fluid Volume: Goal: Ability to maintain a balanced intake and output will improve Outcome: Progressing   Problem: Health Behavior/Discharge  Planning: Goal: Ability to identify and utilize available resources and services will improve Outcome: Progressing Goal: Ability to manage health-related needs will improve Outcome: Progressing   Problem: Metabolic: Goal: Ability to maintain appropriate glucose levels will improve Outcome: Progressing   Problem: Nutritional: Goal: Maintenance of adequate nutrition will improve Outcome: Progressing Goal: Progress toward achieving an optimal weight will improve Outcome: Progressing   Problem: Skin Integrity: Goal: Risk for impaired skin integrity will decrease Outcome: Progressing   Problem: Tissue Perfusion: Goal: Adequacy of tissue perfusion will improve Outcome: Progressing

## 2024-01-20 NOTE — Progress Notes (Signed)
 [address] [employer] [address]             [phone]                10 Days Post-Op Procedure(s) (LRB): REPAIR, MITRAL VALVE (N/A) ECHOCARDIOGRAM, TRANSESOPHAGEAL, INTRAOPERATIVE (N/A)   Events: Co-ox low Remains in afib _______________________________________________________________ Vitals: BP 110/78   Pulse 79   Temp 98.8 F (37.1 C) (Oral)   Resp 11   Ht 5' 4 (1.626 m)   Wt 69.4 kg   SpO2 90%   BMI 26.26 kg/m  Filed Weights   01/18/24 0500 01/19/24 0500 01/20/24 0500  Weight: 72.3 kg 71.5 kg 69.4 kg     - Neuro: alert NAD  - Cardiovascular: afib  Drips: amio 30, milr 0.125.   CVP:  [5 mmHg-10 mmHg] 8 mmHg  - Pulm: EWOB    ABG    Component Value Date/Time   PHART 7.387 01/11/2024 0856   PCO2ART 32.1 01/11/2024 0856   PO2ART 64 (L) 01/11/2024 0856   HCO3 19.3 (L) 01/11/2024 0856   TCO2 20 (L) 01/11/2024 0856   ACIDBASEDEF 5.0 (H) 01/11/2024 0856   O2SAT 46.6 01/20/2024 0702    - Abd: ND - Extremity: trace edema  .Intake/Output      11/15 0701 11/16 0700 11/16 0701 11/17 0700   I.V. (mL/kg) 525.4 (7.6) 38.4 (0.6)   IV Piggyback 1049.9    Total Intake(mL/kg) 1575.2 (22.7) 38.4 (0.6)   Urine (mL/kg/hr) 2540 (1.5) 170 (0.8)   Total Output 2540 170   Net -964.8 -131.6           _______________________________________________________________ Labs:    Latest Ref Rng & Units 01/20/2024    4:36 AM 01/19/2024    4:56 AM 01/18/2024    4:54 AM  CBC  WBC 4.0 - 10.5 K/uL 7.5  10.0  11.5   Hemoglobin 12.0 - 15.0 g/dL 89.6  89.2  88.0   Hematocrit 36.0 - 46.0 % 31.7  31.9  35.1   Platelets 150 - 400 K/uL 124  131  133       Latest Ref Rng & Units 01/20/2024    4:36 AM 01/19/2024    4:56 AM 01/18/2024    3:50 PM  CMP  Glucose 70 - 99 mg/dL 840  765  785   BUN 8 - 23 mg/dL 16  16  21    Creatinine 0.44 - 1.00 mg/dL 8.90  8.85  8.97   Sodium 135 - 145 mmol/L 134  133  133   Potassium 3.5 - 5.1 mmol/L 3.5  3.7   3.3   Chloride 98 - 111 mmol/L 92  93  96   CO2 22 - 32 mmol/L 27  25  24    Calcium 8.9 - 10.3 mg/dL 8.2  8.2  8.3   Total Protein 6.5 - 8.1 g/dL 5.7  5.6    Total Bilirubin 0.0 - 1.2 mg/dL 2.0  1.9    Alkaline Phos 38 - 126 U/L 147  162    AST 15 - 41 U/L 70  93    ALT 0 - 44 U/L 131  169      CXR: stable  _______________________________________________________________  Assessment and Plan: POD 10 s/p MVR  Neuro: pain controlled CV: appreciate AHF.  Remains on milr.  On eliquis. Pulm: IS, ambulation Renal: creat stable, diuresing today GI: on diet Heme: stabl ID: [email or web address] Endo: SSI Dispo: continue  ICU care.   Tracy Baker 01/20/2024 10:12 AM

## 2024-01-20 NOTE — Progress Notes (Signed)
 Patient ID: Tracy Baker, female   DOB: February 17, 1956, 68 y.o.   MRN: [id number]     Advanced Heart Failure Rounding Note  Cardiologist: Lonni Cash, MD  Chief Complaint: S/p MV Repair / MV endocarditis   Patient Profile   Tracy Baker is a 68 y.o. female with hx of recent nephrolithiasis s/p ureteral stent and lithotripsy at [facility] 10/10 with stent removal. 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 Post-op course c/b post cardiotomy shock, junctional bradycardia, afib w/ RVR and HAP.   Subjective:    POD #9 s/p MV repair for candidal MV endocarditis.   She remains on milrinone 0.125, co-ox lower at 47%.  CVP 8, now on oral torsemide.  Weight down 4 lbs.  Creatinine stable at 1.09.   On cefepime+ linezolid empirically for aspiration PNA. WBC 14>>11>>10>>7.5K. Afebrile.   She remains in AF in 80s, on amiodarone gtt 30 mg/hr.   Walked to desk, was fatigued.    Objective:   Weight Range: 69.4 kg Body mass index is 26.26 kg/m.   Vital Signs:   Temp:  [96.3 F (35.7 C)-98.8 F (37.1 C)] 98.8 F (37.1 C) (11/16 0826) Pulse Rate:  [59-90] 83 (11/16 0700) Resp:  [10-22] 17 (11/16 0700) BP: (68-120)/(52-73) 107/57 (11/16 0700) SpO2:  [92 %-98 %] 95 % (11/16 0700) Weight:  [69.4 kg] 69.4 kg (11/16 0500) Last BM Date : 01/18/24  Weight change: Filed Weights   01/18/24 0500 01/19/24 0500 01/20/24 0500  Weight: 72.3 kg 71.5 kg 69.4 kg    Intake/Output:   Intake/Output Summary (Last 24 hours) at 01/20/2024 0845 Last data filed at 01/20/2024 0800 Gross per 24 hour  Intake 1555.75 ml  Output 2455 ml  Net -899.25 ml    Physical Exam   Vitals:   01/20/24 0700 01/20/24 0826  BP: (!) 107/57   Pulse: 83   Resp: 17   Temp:  98.8 F (37.1 C)  SpO2: 95%    General: NAD Neck: JVP 10 cm, no thyromegaly or thyroid  nodule.  Lungs: Clear to auscultation bilaterally with normal respiratory effort. CV: Nondisplaced PMI.  Heart irregular  S1/S2, no S3/S4, 2/6 HSM apex.  No peripheral edema.   Abdomen: Soft, nontender, no hepatosplenomegaly, no distention.  Skin: Intact without lesions or rashes.  Neurologic: Alert and oriented x 3.  Psych: Normal affect. Extremities: No clubbing or cyanosis.  HEENT: Normal.    Telemetry   Atrial fibrillation 80s (Personally reviewed)    Labs    CBC Recent Labs    01/19/24 0456 01/20/24 0436  WBC 10.0 7.5  HGB 10.7* 10.3*  HCT 31.9* 31.7*  MCV 92.5 94.6  PLT 131* 124*   Basic Metabolic Panel Recent Labs    88/84/74 0456 01/20/24 0436  NA 133* 134*  K 3.7 3.5  CL 93* 92*  CO2 25 27  GLUCOSE 234* 159*  BUN 16 16  CREATININE 1.14* 1.09*  CALCIUM 8.2* 8.2*  MG 1.8 1.9   Liver Function Tests Recent Labs    01/19/24 0456 01/20/24 0436  AST 93* 70*  ALT 169* 131*  ALKPHOS 162* 147*  BILITOT 1.9* 2.0*  PROT 5.6* 5.7*  ALBUMIN 2.6* 2.7*   Medications:   Scheduled Medications:  apixaban  5 mg Oral BID   aspirin EC  325 mg Oral Daily   Chlorhexidine Gluconate Cloth  6 each Topical Daily   digoxin  0.125 mg Oral Daily   docusate sodium  200 mg Oral Daily  feeding supplement  237 mL Oral BID BM   furosemide  80 mg Intravenous BID   insulin aspart  0-15 Units Subcutaneous TID WC   insulin aspart  0-5 Units Subcutaneous QHS   pantoprazole  (PROTONIX ) IV  40 mg Intravenous Daily   potassium chloride  20 mEq Per Tube Q4H   scopolamine  2 patch Transdermal Q72H   sodium chloride  flush  10-40 mL Intracatheter Q12H   spironolactone  25 mg Oral Daily    Infusions:  amiodarone 30 mg/hr (01/20/24 0700)   ceFEPime (MAXIPIME) IV Stopped (01/19/24 2246)   fluconazole (DIFLUCAN) IV Stopped (01/19/24 1201)   linezolid (ZYVOX) IV 600 mg (01/19/24 2200)   magnesium sulfate bolus IVPB     milrinone 0.125 mcg/kg/min (01/20/24 0843)   potassium chloride      PRN Medications: bisacodyl **OR** [DISCONTINUED] bisacodyl, clonazePAM , fentaNYL  (SUBLIMAZE ) injection,  HYDROmorphone, mouth rinse, sodium chloride  flush   Assessment/Plan   1. MV Endocarditis d/t Candida Albicans candidemia/fungemia, S/p MV repair w/ Post-cardiotomy Shock  - s/p MV repair 01/10/24 by Dr. Kerrin - Pre-op [facility] 01/04/24 with no CAD and normal L/R heart pressures.  - Intra-op TEE LVEF 60-65%, RV mildly reduced, small immobile mass on anterior valve leaflet, mod MR, trivial TR - Failed initial Milrinone wean. I reviewed 11/13 echo, by my evaluation she has EF 60-65%, moderate RV dysfunction, s/p MV repair with probably moderate mitral regurgitation.  Milrinone restarted.  - Milrinone now down to 0.125 but co-ox 47% today.  I am not going to stop milrinone yet.  Repeat co-ox in afternoon.  Slow wean in setting of RV dysfunction.  - Continue digoxin.  - CVP 8 today, weight still above baseline.  Will give Lasix 80 mg IV bid today to get more fluid off and hopefully resume torsemide torsemide tomorrow. . - Continue spiro 25 mg daily  2. Transient Junctional Nola  - Now in atrial fibrillation, on amiodarone 30 mg/hr.    3. Atrial fibrillation - Currently in AF rate 80s => on amiodarone gtt 30 mg/hr.  - Continue apixaban.  - Would aim for TEE-guided DCCV Monday or Tuesday if she remains in AF.   4. ?HAP/aspiration  - covering w/ cefepime + linezolid, now day 4/5.  - WBC improving   5. 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   7. Elevated LFTs - Marked jump in LFTs 11/8. Now trending down.    CRITICAL CARE Performed by: Ezra Shuck  Total critical care time: 40 minutes  Critical care time was exclusive of separately billable procedures and treating other patients.  Critical care was necessary to treat or prevent imminent or life-threatening deterioration.  Critical care was time spent personally by me on the following activities: development of treatment plan with patient  and/or surrogate as well as nursing, discussions with consultants, evaluation of patient's response to treatment, examination of patient, obtaining history from patient or surrogate, ordering and performing treatments and interventions, ordering and review of laboratory studies, ordering and review of radiographic studies, pulse oximetry and re-evaluation of patient's condition.  Ezra Shuck 01/20/2024 8:45 AM

## 2024-01-20 NOTE — Plan of Care (Signed)
   Problem: Education: Goal: Knowledge of General Education information will improve Description: Including pain rating scale, medication(s)/side effects and non-pharmacologic comfort measures Outcome: Progressing   Problem: Clinical Measurements: Goal: Will remain free from infection Outcome: Progressing Goal: Respiratory complications will improve Outcome: Progressing

## 2024-01-21 DIAGNOSIS — R040 Epistaxis: Secondary | ICD-10-CM | POA: Diagnosis not present

## 2024-01-21 DIAGNOSIS — I4891 Unspecified atrial fibrillation: Secondary | ICD-10-CM | POA: Diagnosis not present

## 2024-01-21 DIAGNOSIS — R570 Cardiogenic shock: Secondary | ICD-10-CM

## 2024-01-21 DIAGNOSIS — Z7982 Long term (current) use of aspirin: Secondary | ICD-10-CM | POA: Diagnosis not present

## 2024-01-21 DIAGNOSIS — B377 Candidal sepsis: Secondary | ICD-10-CM | POA: Diagnosis not present

## 2024-01-21 LAB — MAGNESIUM: Magnesium: 2.1 mg/dL (ref 1.7–2.4)

## 2024-01-21 LAB — CBC
HCT: 31.7 % — ABNORMAL LOW (ref 36.0–46.0)
Hemoglobin: 10.3 g/dL — ABNORMAL LOW (ref 12.0–15.0)
MCH: 31 pg (ref 26.0–34.0)
MCHC: 32.5 g/dL (ref 30.0–36.0)
MCV: 95.5 fL (ref 80.0–100.0)
Platelets: 118 K/uL — ABNORMAL LOW (ref 150–400)
RBC: 3.32 MIL/uL — ABNORMAL LOW (ref 3.87–5.11)
RDW: 21 % — ABNORMAL HIGH (ref 11.5–15.5)
WBC: 7.2 K/uL (ref 4.0–10.5)
nRBC: 0 % (ref 0.0–0.2)

## 2024-01-21 LAB — COMPREHENSIVE METABOLIC PANEL WITH GFR
ALT: 98 U/L — ABNORMAL HIGH (ref 0–44)
AST: 54 U/L — ABNORMAL HIGH (ref 15–41)
Albumin: 2.6 g/dL — ABNORMAL LOW (ref 3.5–5.0)
Alkaline Phosphatase: 142 U/L — ABNORMAL HIGH (ref 38–126)
Anion gap: 13 (ref 5–15)
BUN: 18 mg/dL (ref 8–23)
CO2: 27 mmol/L (ref 22–32)
Calcium: 8.4 mg/dL — ABNORMAL LOW (ref 8.9–10.3)
Chloride: 93 mmol/L — ABNORMAL LOW (ref 98–111)
Creatinine, Ser: 1.39 mg/dL — ABNORMAL HIGH (ref 0.44–1.00)
GFR, Estimated: 41 mL/min — ABNORMAL LOW (ref >60)
Glucose, Bld: 117 mg/dL — ABNORMAL HIGH (ref 70–99)
Potassium: 3.8 mmol/L (ref 3.5–5.1)
Sodium: 133 mmol/L — ABNORMAL LOW (ref 135–145)
Total Bilirubin: 2.1 mg/dL — ABNORMAL HIGH (ref 0.0–1.2)
Total Protein: 5.9 g/dL — ABNORMAL LOW (ref 6.5–8.1)

## 2024-01-21 LAB — COOXEMETRY PANEL
Carboxyhemoglobin: 2 % — ABNORMAL HIGH (ref 0.5–1.5)
Methemoglobin: <0.7 % (ref 0.0–1.5)
O2 Saturation: 60.8 %
Total hemoglobin: 10.8 g/dL — ABNORMAL LOW (ref 12.0–16.0)

## 2024-01-21 LAB — GLUCOSE, CAPILLARY
Glucose-Capillary: 122 mg/dL — ABNORMAL HIGH (ref 70–99)
Glucose-Capillary: 147 mg/dL — ABNORMAL HIGH (ref 70–99)
Glucose-Capillary: 102 mg/dL — ABNORMAL HIGH (ref 70–99)
Glucose-Capillary: 135 mg/dL — ABNORMAL HIGH (ref 70–99)

## 2024-01-21 MED ORDER — POTASSIUM CHLORIDE CRYS ER 20 MEQ PO TBCR
40.0000 meq | EXTENDED_RELEASE_TABLET | Freq: Once | ORAL | Status: AC
  Administered 2024-01-21: 40 meq via ORAL
  Filled 2024-01-21: qty 2

## 2024-01-21 MED ORDER — ASPIRIN 81 MG PO TBEC: 81.0000 mg | DELAYED_RELEASE_TABLET | Freq: Every day | ORAL | Status: DC

## 2024-01-21 MED ORDER — SODIUM CHLORIDE 0.9% FLUSH
3.0000 mL | Freq: Two times a day (BID) | INTRAVENOUS | Status: DC
  Administered 2024-01-21: 3 mL via INTRAVENOUS

## 2024-01-21 MED ORDER — HYDROCORTISONE 1 % EX CREA
1.0000 | TOPICAL_CREAM | Freq: Three times a day (TID) | CUTANEOUS | Status: DC | PRN
Start: 2024-01-21 — End: 2024-01-28
  Filled 2024-01-21: qty 28

## 2024-01-21 MED ORDER — ONDANSETRON HCL 4 MG/2ML IJ SOLN
INTRAMUSCULAR | Status: AC
  Administered 2024-01-21: 4 mg via INTRAVENOUS
  Filled 2024-01-21: qty 2

## 2024-01-21 MED ORDER — SODIUM CHLORIDE 0.9% FLUSH: 3.0000 mL | INTRAVENOUS | Status: DC | PRN

## 2024-01-21 MED ORDER — SALINE SPRAY 0.65 % NA SOLN
1.0000 | NASAL | Status: DC | PRN
  Administered 2024-02-07: 1 via NASAL

## 2024-01-21 MED ORDER — ONDANSETRON HCL 4 MG/2ML IJ SOLN
4.0000 mg | Freq: Three times a day (TID) | INTRAMUSCULAR | Status: DC | PRN
  Administered 2024-01-25 – 2024-02-06 (×4): 4 mg via INTRAVENOUS
  Filled 2024-01-21 (×4): qty 2

## 2024-01-21 MED ORDER — TORSEMIDE 20 MG PO TABS
40.0000 mg | ORAL_TABLET | Freq: Two times a day (BID) | ORAL | Status: DC
  Administered 2024-01-21 (×2): 40 mg via ORAL
  Filled 2024-01-21 (×2): qty 2

## 2024-01-21 MED ORDER — SODIUM CHLORIDE 0.9 % IV SOLN: 250.0000 mL | INTRAVENOUS | Status: DC | PRN

## 2024-01-21 MED ORDER — OXYMETAZOLINE HCL 0.05 % NA SOLN
1.0000 | Freq: Two times a day (BID) | NASAL | Status: AC
  Administered 2024-01-21 – 2024-01-23 (×3): 1 via NASAL
  Filled 2024-01-21: qty 30

## 2024-01-21 NOTE — Progress Notes (Addendum)
 Patient ID: Tracy Baker, female   DOB: [birth year], 68 y.o.   MRN: [id number]     Advanced Heart Failure Rounding Note  Cardiologist: Lonni Cash, MD  Chief Complaint: S/p MV Repair / MV endocarditis   Patient Profile   Tracy Baker is a 68 y.o. female with hx of recent nephrolithiasis s/p ureteral stent and lithotripsy at [facility] 10/10 with stent removal. 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 Post-op course c/b post cardiotomy shock, junctional bradycardia, afib w/ RVR and HAP.   Subjective:    POD #11 s/p MV repair for candidal MV endocarditis.   On milrinone 0.25 with co-ox 60%. CVP 5 sitting up in chair.  Remains in AF with controlled rate on amiodarone gtt at 30 mg/hr. Recurrent epistaxis the last 3 days.   On cefepime + linezolid empirically for aspiration PNA.   Ambulated twice yesterday. Gets fatigued and short of breath with exertion. Hasn't had much appetite.   Objective:   Weight Range: 68.7 kg Body mass index is 26 kg/m.   Vital Signs:   Temp:  [97.8 F (36.6 C)-98.8 F (37.1 C)] 97.8 F (36.6 C) (11/16 2310) Pulse Rate:  [56-86] 86 (11/17 0600) Resp:  [11-24] 19 (11/17 0600) BP: (79-122)/(43-108) 108/57 (11/17 0500) SpO2:  [90 %-94 %] 93 % (11/17 0600) Weight:  [68.7 kg] 68.7 kg (11/17 0500) Last BM Date : 01/18/24  Weight change: Filed Weights   01/19/24 0500 01/20/24 0500 01/21/24 0500  Weight: 71.5 kg 69.4 kg 68.7 kg    Intake/Output:   Intake/Output Summary (Last 24 hours) at 01/21/2024 0800 Last data filed at 01/21/2024 0600 Gross per 24 hour  Intake 1978.76 ml  Output 3630 ml  Net -1651.24 ml    Physical Exam   Vitals:   01/21/24 0500 01/21/24 0600  BP: (!) 108/57   Pulse: 78 86  Resp: 14 19  Temp:    SpO2: 91% 93%   General:  Sitting up in chair. Appears fatigued HEENT: holding pressure to nares Cor: Irregular rhythm. NO murmur. Lungs: breathing nonlabored Abdomen: soft, nontender,  nondistended. Extremities: trace edema Neuro: alert & orientedx3. Affect pleasant    Telemetry   Atrial fibrillation 80s (personally reviewed)  Labs    CBC Recent Labs    01/19/24 0456 01/20/24 0436  WBC 10.0 7.5  HGB 10.7* 10.3*  HCT 31.9* 31.7*  MCV 92.5 94.6  PLT 131* 124*   Basic Metabolic Panel Recent Labs    88/83/74 0436 01/20/24 1234 01/21/24 0419  NA 134* 132* 133*  K 3.5 3.9 3.8  CL 92* 90* 93*  CO2 27 28 27   GLUCOSE 159* 170* 117*  BUN 16 17 18   CREATININE 1.09* 1.24* 1.39*  CALCIUM 8.2* 8.2* 8.4*  MG 1.9  --  2.1   Liver Function Tests Recent Labs    01/20/24 0436 01/21/24 0419  AST 70* 54*  ALT 131* 98*  ALKPHOS 147* 142*  BILITOT 2.0* 2.1*  PROT 5.7* 5.9*  ALBUMIN 2.7* 2.6*   Medications:   Scheduled Medications:  apixaban  5 mg Oral BID   aspirin EC  81 mg Oral Daily   Chlorhexidine Gluconate Cloth  6 each Topical Daily   digoxin  0.125 mg Oral Daily   docusate sodium  200 mg Oral Daily   feeding supplement  237 mL Oral BID BM   insulin aspart  0-15 Units Subcutaneous TID WC   insulin aspart  0-5 Units Subcutaneous QHS   ondansetron   pantoprazole  (PROTONIX ) IV  40 mg Intravenous Daily   scopolamine  2 patch Transdermal Q72H   sodium chloride  flush  10-40 mL Intracatheter Q12H   spironolactone  25 mg Oral Daily    Infusions:  amiodarone 30 mg/hr (01/21/24 0600)   ceFEPime (MAXIPIME) IV Stopped (01/20/24 2151)   fluconazole (DIFLUCAN) IV Stopped (01/20/24 1204)   linezolid (ZYVOX) IV 300 mL/hr at 01/21/24 0600   milrinone 0.25 mcg/kg/min (01/21/24 0600)    PRN Medications: bisacodyl **OR** [DISCONTINUED] bisacodyl, clonazePAM , fentaNYL  (SUBLIMAZE ) injection, HYDROmorphone, ondansetron , mouth rinse, sodium chloride  flush   Assessment/Plan   1. MV Endocarditis d/t Candida Albicans candidemia/fungemia, S/p MV repair w/ Post-cardiotomy Shock  - s/p MV repair 01/10/24 by Dr. Kerrin - Pre-op [facility] 01/04/24 with no CAD  and normal L/R heart pressures.  - Intra-op TEE LVEF 60-65%, RV mildly reduced, small immobile mass on anterior valve leaflet, mod MR, trivial TR - Failed initial Milrinone wean. Echo 11/13 per Dr. Rolan review: EF 60-65%, moderate RV dysfunction, s/p MV repair with probably moderate mitral regurgitation.  Milrinone restarted.  - Unable to wean milrinone, milrinone increased from 0.125 to 0.25 on 11/16 d/t CO-OX of 40%.  - CO-OX 60% this am on 0.25 milrinone. Slow wean in setting of RV dysfunction. Would benefit from restoring SR. - Continue digoxin.  - CVP 5 sitting up in chair. Down over 20 lb but still up 5 lb from pre-op. Need to use caution not to overshoot diuresis with RV dysfunction. Has been getting IV lasix. Will start 40 mg torsemide BID. - Continue spiro 25 mg daily  2. Transient Junctional Nola  - Now in atrial fibrillation, on amiodarone 30 mg/hr.    3. Atrial fibrillation - Currently in AF rate 80s => on amiodarone gtt 30 mg/hr.  - Continue apixaban.  - Plan for TEE/DCCV tomorrow, 11/18, due to epistaxis this am. See below.  4. ?HAP/aspiration  - covering w/ cefepime + linezolid, end date 11/19 - afebrile  5. 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   7. Elevated LFTs - Marked jump in LFTs 11/8. Now trending down.   8. Episatxis - Recurrent X 3 days. Continues on eliquis + 325 mg aspirin. TCTS stopped ASA. - Will try to avoid stopping eliquis so that she can get cardioversion - Check CBC - TCTS planning ENT consult  CRITICAL CARE Performed by: COLLETTA MANUELITA SAILOR   Total critical care time: 14 minutes  Critical care time was exclusive of separately billable procedures and treating other patients.  Critical care was necessary to treat or prevent imminent or life-threatening deterioration.  Critical care was time spent personally by me on the following activities: development  of treatment plan with patient and/or surrogate as well as nursing, discussions with consultants, evaluation of patient's response to treatment, examination of patient, obtaining history from patient or surrogate, ordering and performing treatments and interventions, ordering and review of laboratory studies, ordering and review of radiographic studies, pulse oximetry and re-evaluation of patient's condition.    FINCH, LINDSAY N 01/21/2024 8:00 AM  Patient seen with PA, I formulated the plan and agree with the above note.    She is still in rate-controlled atrial fibrillation on amiodarone gtt 30 mg/hr and apixaban.  She has been having significant epistaxis the last 3 days.    Co-ox up to 61% but back on milrinone 0.25.  She diuresed well yesterday, I/Os net negative 1882. Creatinine  mildly higher at 1.39.  CVP now 5.   General: NAD Neck: No JVD, no thyromegaly or thyroid  nodule.  Lungs: Clear to auscultation bilaterally with normal respiratory effort. CV: Nondisplaced PMI.  Heart irregular S1/S2, no S3/S4, no murmur.  No peripheral edema.    Abdomen: Soft, nontender, no hepatosplenomegaly, no distention.  Skin: Intact without lesions or rashes.  Neurologic: Alert and oriented x 3.  Psych: Normal affect. Extremities: No clubbing or cyanosis.  HEENT: Normal.   Good diuresis yesterday, transition to torsemide 40 mg po bid as above.   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  I hope that cardioversion to restore NSR will help cardiac output.  We had planned on TEE-guided DCCV today, but with significant epistaxis will hold off until tomorrow to make sure we can get this under control and will not have to stop apixaban.  She may need nasal packing, ENT to be consulted by TCTS.  Will stop aspirin.  Tentative lan for TEE-guided DCCV tomorrow, will continue amiodarone 30 mg/hr.    Mobilize.   CRITICAL CARE Performed by: Ezra Shuck  Total critical care time: 40 minutes  Critical care time was exclusive of separately billable procedures and treating other patients.  Critical care was necessary to treat or prevent imminent or life-threatening deterioration.  Critical care was time spent personally by me on the following activities: development of treatment plan with patient and/or surrogate as well as nursing, discussions with consultants, evaluation of patient's response to treatment, examination of patient, obtaining history from patient or surrogate, ordering and performing treatments and interventions, ordering and review of laboratory studies, ordering and review of radiographic studies, pulse oximetry and re-evaluation of patient's condition.  Ezra Shuck 01/21/2024 9:52 AM

## 2024-01-21 NOTE — Plan of Care (Signed)
 Problem: Education: Goal: Knowledge of General Education information will improve Description: Including pain rating scale, medication(s)/side effects and non-pharmacologic comfort measures Outcome: Progressing   Problem: Health Behavior/Discharge Planning: Goal: Ability to manage health-related needs will improve Outcome: Progressing   Problem: Clinical Measurements: Goal: Ability to maintain clinical measurements within normal limits will improve Outcome: Progressing Goal: Will remain free from infection Outcome: Progressing Goal: Diagnostic test results will improve Outcome: Progressing Goal: Respiratory complications will improve Outcome: Progressing Goal: Cardiovascular complication will be avoided Outcome: Progressing   Problem: Activity: Goal: Risk for activity intolerance will decrease Outcome: Progressing   Problem: Nutrition: Goal: Adequate nutrition will be maintained Outcome: Progressing   Problem: Coping: Goal: Level of anxiety will decrease Outcome: Progressing   Problem: Elimination: Goal: Will not experience complications related to bowel motility Outcome: Progressing Goal: Will not experience complications related to urinary retention Outcome: Progressing   Problem: Pain Managment: Goal: General experience of comfort will improve and/or be controlled Outcome: Progressing   Problem: Safety: Goal: Ability to remain free from injury will improve Outcome: Progressing   Problem: Skin Integrity: Goal: Risk for impaired skin integrity will decrease Outcome: Progressing   Problem: Education: Goal: Understanding of CV disease, CV risk reduction, and recovery process will improve Outcome: Progressing Goal: Individualized Educational Video(s) Outcome: Progressing   Problem: Activity: Goal: Ability to return to baseline activity level will improve Outcome: Progressing   Problem: Cardiovascular: Goal: Ability to achieve and maintain adequate  cardiovascular perfusion will improve Outcome: Progressing Goal: Vascular access site(s) Level 0-1 will be maintained Outcome: Progressing   Problem: Health Behavior/Discharge Planning: Goal: Ability to safely manage health-related needs after discharge will improve Outcome: Progressing   Problem: Education: Goal: Will demonstrate proper wound care and an understanding of methods to prevent future damage Outcome: Progressing Goal: Knowledge of disease or condition will improve Outcome: Progressing Goal: Knowledge of the prescribed therapeutic regimen will improve Outcome: Progressing Goal: Individualized Educational Video(s) Outcome: Progressing   Problem: Activity: Goal: Risk for activity intolerance will decrease Outcome: Progressing   Problem: Cardiac: Goal: Will achieve and/or maintain hemodynamic stability Outcome: Progressing   Problem: Clinical Measurements: Goal: Postoperative complications will be avoided or minimized Outcome: Progressing   Problem: Respiratory: Goal: Respiratory status will improve Outcome: Progressing   Problem: Skin Integrity: Goal: Wound healing without signs and symptoms of infection Outcome: Progressing Goal: Risk for impaired skin integrity will decrease Outcome: Progressing   Problem: Urinary Elimination: Goal: Ability to achieve and maintain adequate renal perfusion and functioning will improve Outcome: Progressing   Problem: Education: Goal: Ability to describe self-care measures that may prevent or decrease complications (Diabetes Survival Skills Education) will improve Outcome: Progressing Goal: Individualized Educational Video(s) Outcome: Progressing   Problem: Coping: Goal: Ability to adjust to condition or change in health will improve Outcome: Progressing   Problem: Fluid Volume: Goal: Ability to maintain a balanced intake and output will improve Outcome: Progressing   Problem: Health Behavior/Discharge  Planning: Goal: Ability to identify and utilize available resources and services will improve Outcome: Progressing Goal: Ability to manage health-related needs will improve Outcome: Progressing   Problem: Metabolic: Goal: Ability to maintain appropriate glucose levels will improve Outcome: Progressing   Problem: Nutritional: Goal: Maintenance of adequate nutrition will improve Outcome: Progressing Goal: Progress toward achieving an optimal weight will improve Outcome: Progressing   Problem: Skin Integrity: Goal: Risk for impaired skin integrity will decrease Outcome: Progressing   Problem: Tissue Perfusion: Goal: Adequacy of tissue perfusion will improve Outcome: Progressing

## 2024-01-21 NOTE — Progress Notes (Addendum)
 [address] [place] [employer] [address]             [phone]      11 Days Post-Op Procedure(s) (LRB): REPAIR, MITRAL VALVE (N/A) ECHOCARDIOGRAM, TRANSESOPHAGEAL, INTRAOPERATIVE (N/A) Subjective: Patient sitting in the chair with active epistaxis, reports she has had a nosebleed the last 2 days as well. Coughing up blood clots.   Objective: Vital signs in last 24 hours: Temp:  [97.8 F (36.6 C)-98.8 F (37.1 C)] 97.8 F (36.6 C) (11/16 2310) Pulse Rate:  [56-86] 86 (11/17 0600) Cardiac Rhythm: Atrial fibrillation (11/16 1915) Resp:  [11-24] 19 (11/17 0600) BP: (79-122)/(43-108) 108/57 (11/17 0500) SpO2:  [90 %-94 %] 93 % (11/17 0600) Weight:  [68.7 kg] 68.7 kg (11/17 0500)  Hemodynamic parameters for last 24 hours: CVP:  [2 mmHg-21 mmHg] 2 mmHg  Intake/Output from previous day: 11/16 0701 - 11/17 0700 In: 1997.6 [I.V.:492.6; IV Piggyback:1505] Out: 3800 [Urine:3800] Intake/Output this shift: No intake/output data recorded.  General appearance: alert, cooperative, and no distress Neurologic: intact Heart: irregularly irregular rhythm Lungs: slightly diminished bibasilar breath sounds Abdomen: soft, non-tender; bowel sounds normal; no masses,  no organomegaly Extremities: edema trace BLE Wound: Clean and dry incision sites without sign of infection, active epistaxis from left nare  Lab Results: Recent Labs    01/19/24 0456 01/20/24 0436  WBC 10.0 7.5  HGB 10.7* 10.3*  HCT 31.9* 31.7*  PLT 131* 124*   BMET:  Recent Labs    01/20/24 1234 01/21/24 0419  NA 132* 133*  K 3.9 3.8  CL 90* 93*  CO2 28 27  GLUCOSE 170* 117*  BUN 17 18  CREATININE 1.24* 1.39*  CALCIUM 8.2* 8.4*    PT/INR: No results for input(s): LABPROT, INR in the last 72 hours. ABG    Component Value Date/Time   PHART 7.387 01/11/2024 0856   HCO3 19.3 (L) 01/11/2024 0856   TCO2 20 (L) 01/11/2024 0856   ACIDBASEDEF 5.0 (H) 01/11/2024 0856   O2SAT 60.8  01/21/2024 0419   CBG (last 3)  Recent Labs    01/20/24 1555 01/20/24 2115 01/21/24 0623  GLUCAP 108* 124* 122*    Assessment/Plan: S/P Procedure(s) (LRB): REPAIR, MITRAL VALVE (N/A) ECHOCARDIOGRAM, TRANSESOPHAGEAL, INTRAOPERATIVE (N/A)  Neuro: Pain controlled, continue current pain regimen.   ENT: Epistaxis from left nare for about 10 minutes with blood clots, have not been able to stop the bleeding. Patient reports she has had 1 every day for the last 3 days. Will order ocean nasal spray and afrin. Will likely need ENT consult.    CV: BP overall stable but soft at times, BP 125/78 this AM. Postop junctional rhythm and afib with RVR. On IV Amiodarone. In afib HR controlled 80s this AM. On Eliquis and ASA 325mg  daily, will hold ASA for now. Coox 60 this AM, improved from 40 yesterday. On milrinone 0.25 and Digoxin 0.125mg  daily. On Spironolactone 25mg  daily. AHF team planned to DCCV today but will wait until tomorrow with epistaxis.   Pulm: Saturating well on RA this AM. CXR yesterday with bibasilar atelectasis and small bilateral pleural effusions. Started on Cefepime and Linezolid last week for possible HCAP. Encourage IS and ambulation   GI: Little appetite, advance diet as tolerated. Last BM 11/14. Nausea with dry heaving, continue zofran .   Endo: Preop A1C 5.4, looks like history of prediabetes, no home meds. CBGs controlled on SSI AC/HS.  Renal: Cr 1.39, slightly increased from  1.24 yesterday. UO 3800cc/24hrs. About +5lbs from preop weight. K 3.8, supplement. Received Lasix IV 80mg  BID yesterday, can likely transition to PO soon.     ID: Leukocytosis, resolved. Tmax 98.8. On fluconazole for Candidal bacteremia/MV endocarditis. On Cefepime and Linezolid x 5d for possible HCAP.   Expected postop ABLA: Last H/H 11.9/35.1, stable. Will get STAT CBC with epistaxis. Thrombocytopenia improved 124,000 on 11/16  DVT Prophylaxis: No lovenox, on Eliquis.    Dispo: Continue ICU care    LOS: 19 days    [name] Bend, PA-C 01/21/2024  Patient seen and examined, agree with above Plan d/w AHF team  Elspeth C. Kerrin, MD [facility] [phone]

## 2024-01-21 NOTE — Progress Notes (Signed)
 Physical Therapy Treatment Patient Details Name: Tracy Baker MRN: [id number] DOB: 10/03/1955 Today's Date: 01/21/2024   History of Present Illness 68 y.o. female presents to [facility] hospital on 12/29/2023 with after leaving AMA from [facility] where she was being treated for C. Albicans candidemia. TEE on 10/28 positive for vegetation of mitral valve. Extraction of multiple teeth on 11/3. Mitral valve repair on 11/6. PMH includes thyroid  disease.    PT Comments  Progressing towards goals, not feeling as well today but did tolerate gradual increase in ambulatory distance. She needs up to min assist for intermittent LOB and staggering. Cues for awareness, techniques to maximize stability and safety. Min assist for bed mobility to maintain sternal precautions and rise to EOB. VSS throughout visit on RA. Patient will continue to benefit from skilled physical therapy services to further improve independence with functional mobility.     If plan is discharge home, recommend the following: Assistance with cooking/housework;Assist for transportation;Help with stairs or ramp for entrance;A little help with walking and/or transfers;A little help with bathing/dressing/bathroom   Can travel by private vehicle        Equipment Recommendations  Rolling walker (2 wheels);BSC/3in1    Recommendations for Other Services       Precautions / Restrictions Precautions Precautions: Fall;Sternal Precaution Booklet Issued: Yes (comment) Recall of Precautions/Restrictions: Intact Precaution/Restrictions Comments: Reviewed precautions. Restrictions Weight Bearing Restrictions Per Provider Order: Yes RUE Weight Bearing Per Provider Order: Non weight bearing LUE Weight Bearing Per Provider Order: Non weight bearing Other Position/Activity Restrictions: Cardiac sternal precautions     Mobility  Bed Mobility Overal bed mobility: Needs Assistance Bed Mobility: Rolling, Sit to Sidelying Rolling: Contact guard  assist Sidelying to sit: Min assist, HOB elevated       General bed mobility comments: Min assist for trunk to rise from sidelying position, holding heart pillow, cues for technique.    Transfers Overall transfer level: Needs assistance Equipment used: Rolling walker (2 wheels) Transfers: Sit to/from Stand Sit to Stand: Contact guard assist           General transfer comment: CGA for safety, slow to rise but steady with RW for support.    Ambulation/Gait Ambulation/Gait assistance: Min assist Gait Distance (Feet): 80 Feet Assistive device: Rolling walker (2 wheels) Gait Pattern/deviations: Step-through pattern, Decreased stride length, Drifts right/left, Narrow base of support, Scissoring Gait velocity: decreased Gait velocity interpretation: <1.31 ft/sec, indicative of household ambulator   General Gait Details: Intermitent min assist for RW control and instability. No overt buckling does demonstrates staggering at times, typically towards Rt side. Cues for sequencing and awareness. Bumps into items unless cued and corrected in congested areas. VSS on RA throughout distance.   Stairs             Wheelchair Mobility     Tilt Bed    Modified Rankin (Stroke Patients Only)       Balance Overall balance assessment: Needs assistance Sitting-balance support: No upper extremity supported, Feet supported Sitting balance-Leahy Scale: Good     Standing balance support: Bilateral upper extremity supported, During functional activity, Reliant on assistive device for balance Standing balance-Leahy Scale: Poor Standing balance comment: Reliant on RW                            Communication Communication Communication: No apparent difficulties  Cognition Arousal: Alert Behavior During Therapy: WFL for tasks assessed/performed   PT - Cognitive impairments: Problem solving, Safety/Judgement  Following commands:  Intact Following commands impaired: Only follows one step commands consistently    Cueing Cueing Techniques: Verbal cues, Gestural cues  Exercises General Exercises - Lower Extremity Ankle Circles/Pumps: AROM, Both, 10 reps, Seated Quad Sets: Strengthening, Both, 10 reps, Seated Long Arc Quad: Strengthening, Both, 10 reps, Seated    General Comments General comments (skin integrity, edema, etc.): VSS      Pertinent Vitals/Pain Pain Assessment Pain Assessment: Faces Faces Pain Scale: Hurts a little bit Pain Location: sternum Pain Descriptors / Indicators: Discomfort Pain Intervention(s): Limited activity within patient's tolerance, Monitored during session, Repositioned    Home Living                          Prior Function            PT Goals (current goals can now be found in the care plan section) Acute Rehab PT Goals Patient Stated Goal: to return to work PT Goal Formulation: With patient Time For Goal Achievement: 01/27/24 Potential to Achieve Goals: Fair Progress towards PT goals: Progressing toward goals    Frequency    Min 2X/week      PT Plan      Co-evaluation              AM-PAC PT 6 Clicks Mobility   Outcome Measure  Help needed turning from your back to your side while in a flat bed without using bedrails?: A Little Help needed moving from lying on your back to sitting on the side of a flat bed without using bedrails?: A Little Help needed moving to and from a bed to a chair (including a wheelchair)?: A Little Help needed standing up from a chair using your arms (e.g., wheelchair or bedside chair)?: A Little Help needed to walk in hospital room?: A Little Help needed climbing 3-5 steps with a railing? : Total 6 Click Score: 16    End of Session   Activity Tolerance: Patient tolerated treatment well Patient left: with call bell/phone within reach;in chair;with chair alarm set;with nursing/sitter in room Nurse Communication:  Mobility status PT Visit Diagnosis: Other abnormalities of gait and mobility (R26.89);Muscle weakness (generalized) (M62.81);Difficulty in walking, not elsewhere classified (R26.2);Pain Pain - part of body:  (sternum)     Time: 8790-8769 PT Time Calculation (min) (ACUTE ONLY): 21 min  Charges:    $Gait Training: 8-22 mins PT General Charges $$ ACUTE PT VISIT: 1 Visit                     [name], PT, DPT [facility]  Rehabilitation Services Physical Therapist Office: [phone] Website: [email or web address]    [name] 01/21/2024, 1:56 PM

## 2024-01-21 NOTE — Progress Notes (Addendum)
 Occupational Therapy Treatment Patient Details Name: Tracy Baker MRN: [id number] DOB: 1955-07-25 Today's Date: 01/21/2024   History of present illness 68 y.o. female presents to [facility] hospital on 12/29/2023 with after leaving AMA from [facility] where she was being treated for C. Albicans candidemia. TEE on 10/28 positive for vegetation of mitral valve. Extraction of multiple teeth on 11/3. Mitral valve repair on 11/6. PMH includes thyroid  disease.   OT comments  Pt progressing toward goals, discouraged due to frequent nose bleeds. Pt able to verbalize sternal precautions and overall needs up to CGA for grooming task at sink. Pt min A for bed mobility and min A for transfers with RW. Pt overall with poor activity tolerance, educated on importance of continued mobility during hospital admission. Pt presenting with impairments listed below, will follow acutely. Patient will benefit from intensive inpatient follow-up therapy, >3 hours/day to maximize safety/ind with ADL/funcitonal mobility.       If plan is discharge home, recommend the following:  A lot of help with bathing/dressing/bathroom;A lot of help with walking and/or transfers;Assist for transportation;Assistance with cooking/housework   Equipment Recommendations  Tub/shower seat    Recommendations for Other Services      Precautions / Restrictions Precautions Precautions: Fall;Sternal Precaution Booklet Issued: Yes (comment) Precaution/Restrictions Comments: Reviewed precautions.       Mobility Bed Mobility Overal bed mobility: Needs Assistance           Sit to sidelying: Min assist General bed mobility comments: Min assist for LE support into bed. Cues for technique, holding hear pillow for precautions and comfort. able to roll on her own.    Transfers Overall transfer level: Needs assistance Equipment used: Rolling walker (2 wheels) Transfers: Sit to/from Stand Sit to Stand: Min assist                 Balance  Overall balance assessment: Needs assistance Sitting-balance support: No upper extremity supported, Feet supported Sitting balance-Leahy Scale: Good     Standing balance support: Bilateral upper extremity supported, During functional activity, Reliant on assistive device for balance Standing balance-Leahy Scale: Poor Standing balance comment: Reliant on RW                           ADL either performed or assessed with clinical judgement   ADL Overall ADL's : Needs assistance/impaired     Grooming: Oral care;Wash/dry face;Set up;Standing                   Toilet Transfer: Contact guard assist;Ambulation;Rolling walker (2 wheels)           Functional mobility during ADLs: Contact guard assist;Rolling walker (2 wheels)      Extremity/Trunk Assessment Upper Extremity Assessment Upper Extremity Assessment: Generalized weakness   Lower Extremity Assessment Lower Extremity Assessment: Defer to PT evaluation        Vision   Vision Assessment?: No apparent visual deficits   Perception Perception Perception: Not tested   Praxis Praxis Praxis: Not tested   Communication Communication Communication: No apparent difficulties   Cognition Arousal: Alert Behavior During Therapy: WFL for tasks assessed/performed Cognition: No apparent impairments                               Following commands: Intact        Cueing   Cueing Techniques: Verbal cues, Gestural cues  Exercises      Shoulder  Instructions       General Comments VSS on rA    Pertinent Vitals/ Pain       Pain Assessment Pain Assessment: Faces Pain Score: 2  Faces Pain Scale: Hurts a little bit Pain Location: generalized Pain Descriptors / Indicators: Discomfort Pain Intervention(s): Limited activity within patient's tolerance, Monitored during session, Repositioned  Home Living                                          Prior  Functioning/Environment              Frequency  Min 2X/week        Progress Toward Goals  OT Goals(current goals can now be found in the care plan section)  Progress towards OT goals: Progressing toward goals  Acute Rehab OT Goals Patient Stated Goal: to lay down OT Goal Formulation: With patient Time For Goal Achievement: 01/29/24 Potential to Achieve Goals: Good ADL Goals Pt Will Perform Grooming: with supervision;standing Pt Will Perform Upper Body Bathing: with supervision;sitting Pt Will Perform Lower Body Bathing: with min assist;sit to/from stand Pt Will Perform Upper Body Dressing: with supervision;sitting Pt Will Perform Lower Body Dressing: with min assist;sit to/from stand Pt Will Transfer to Toilet: with supervision;ambulating;regular height toilet  Plan      Co-evaluation                 AM-PAC OT 6 Clicks Daily Activity     Outcome Measure   Help from another person eating meals?: None Help from another person taking care of personal grooming?: None Help from another person toileting, which includes using toliet, bedpan, or urinal?: A Little Help from another person bathing (including washing, rinsing, drying)?: A Little Help from another person to put on and taking off regular upper body clothing?: A Little Help from another person to put on and taking off regular lower body clothing?: A Little 6 Click Score: 20    End of Session Equipment Utilized During Treatment: Rolling walker (2 wheels)  OT Visit Diagnosis: Unsteadiness on feet (R26.81);Muscle weakness (generalized) (M62.81)   Activity Tolerance Patient tolerated treatment well   Patient Left in bed;with call bell/phone within reach   Nurse Communication Mobility status        Time: 9096-9072 OT Time Calculation (min): 24 min  Charges: OT General Charges $OT Visit: 1 Visit OT Treatments $Self Care/Home Management : 8-22 mins $Therapeutic Activity: 8-22 mins  Davonne Baby K,  OTD, OTR/L SecureChat Preferred Acute Rehab [phone]   [name] 01/21/2024, 10:53 AM

## 2024-01-21 NOTE — Progress Notes (Signed)
 TCTS Evening Rounds:  Hemodynamically stable in atrial fib with controlled rate on IV amio. Milrinone 0.25.  Good UO ENT put surgiflow in left nasal cavity today. No further bleeding today. Planning DCCV tomorrow.

## 2024-01-21 NOTE — Progress Notes (Signed)
 Significant episode of epistaxis from left nare. Pressure held for total of 15 minutes before hemostasis achieved. PA's at bedside. See EMR for orders received.

## 2024-01-21 NOTE — Consult Note (Signed)
 ENT CONSULT:  Reason for Consult: epistaxis  Referring Physician:  Elspeth Millers, MD  HPI: Tracy Baker is an 68 y.o. female  who is POD11 after mitral valve repair on aspirin and eliquis who has had recurrent epistaxis on the floor for which ENT is consulted.   Aspirin has been since discontinued. Pt hemostatic at bedside. Has had 2 episodes, first was not bad but second episode lasted about an hour. Resolved with afrin and pressure.   Bleeding was from left side.   Past Medical History:  Diagnosis Date   Thyroid  disease     Past Surgical History:  Procedure Laterality Date   APPENDECTOMY     AUGMENTATION MAMMAPLASTY Bilateral 1981   removed in 2006   BREAST IMPLANT REMOVAL Bilateral 2007   CHOLECYSTECTOMY  2010   COLONOSCOPY WITH PROPOFOL  N/A 06/30/2021   Procedure: COLONOSCOPY WITH PROPOFOL ;  Surgeon: Jinny Carmine, MD;  Location: [facility] ENDOSCOPY;  Service: Endoscopy;  Laterality: N/A;   INTRAOPERATIVE TRANSESOPHAGEAL ECHOCARDIOGRAM N/A 01/10/2024   Procedure: ECHOCARDIOGRAM, TRANSESOPHAGEAL, INTRAOPERATIVE;  Surgeon: Millers Elspeth BROCKS, MD;  Location: [facility] OR;  Service: Open Heart Surgery;  Laterality: N/A;   LAPAROSCOPIC TOTAL HYSTERECTOMY  2006   MITRAL VALVE REPAIR N/A 01/10/2024   Procedure: REPAIR, MITRAL VALVE;  Surgeon: Millers Elspeth BROCKS, MD;  Location: [facility] OR;  Service: Open Heart Surgery;  Laterality: N/A;   RIGHT/LEFT HEART CATH AND CORONARY ANGIOGRAPHY N/A 01/04/2024   Procedure: RIGHT/LEFT HEART CATH AND CORONARY ANGIOGRAPHY;  Surgeon: Verlin Lonni BIRCH, MD;  Location: [facility] INVASIVE CV LAB;  Service: Cardiovascular;  Laterality: N/A;   TOOTH EXTRACTION N/A 01/07/2024   Procedure: DENTAL RESTORATION/EXTRACTIONS;  Surgeon: Sheryle Hamilton, DMD;  Location: [facility] OR;  Service: Oral Surgery;  Laterality: N/A;  EXTRACTION OF TEETH #2, #6, #7, #16, #29   TRANSESOPHAGEAL ECHOCARDIOGRAM (CATH LAB) N/A 01/01/2024   Procedure: TRANSESOPHAGEAL ECHOCARDIOGRAM;  Surgeon:  Lonni Slain, MD;  Location: [facility] INVASIVE CV LAB;  Service: Cardiovascular;  Laterality: N/A;    Family History  Problem Relation Age of Onset   Hypertension Mother    Diabetes Father    Breast cancer Neg Hx     Social History:  reports that she has been smoking cigarettes. She has never used smokeless tobacco. She reports current alcohol use. She reports that she does not use drugs.  Allergies:  Allergies  Allergen Reactions   Codeine Anaphylaxis and Nausea And Vomiting   Red Dye #40 (Allura Red) Nausea Only    Medications: I have reviewed the patient's current medications.  Results for orders placed or performed during the hospital encounter of 12/28/23 (from the past 48 hours)  Glucose, capillary     Status: Abnormal   Collection Time: 01/19/24  4:21 PM  Result Value Ref Range   Glucose-Capillary 135 (H) 70 - 99 mg/dL    Comment: Glucose reference range applies only to samples taken after fasting for at least 8 hours.  Glucose, capillary     Status: Abnormal   Collection Time: 01/19/24  9:59 PM  Result Value Ref Range   Glucose-Capillary 122 (H) 70 - 99 mg/dL    Comment: Glucose reference range applies only to samples taken after fasting for at least 8 hours.  Cooxemetry Panel (carboxy, met, total hgb, O2 sat)     Status: Abnormal   Collection Time: 01/20/24  4:36 AM  Result Value Ref Range   Total hemoglobin 11.2 (L) 12.0 - 16.0 g/dL   O2 Saturation 54.7 %   Carboxyhemoglobin 0.6 0.5 -  1.5 %   Methemoglobin 1.3 0.0 - 1.5 %    Comment: Performed at [facility] Lab, [address], [place], [place] [address]  Magnesium     Status: None   Collection Time: 01/20/24  4:36 AM  Result Value Ref Range   Magnesium 1.9 1.7 - 2.4 mg/dL    Comment: Performed at [facility] Lab, [address] [place], [place] [address]  CBC     Status: Abnormal   Collection Time: 01/20/24  4:36 AM  Result Value Ref Range   WBC 7.5 4.0 - 10.5 K/uL   RBC 3.35 (L) 3.87 - 5.11  MIL/uL   Hemoglobin 10.3 (L) 12.0 - 15.0 g/dL   HCT 68.2 (L) 63.9 - 53.9 %   MCV 94.6 80.0 - 100.0 fL   MCH 30.7 26.0 - 34.0 pg   MCHC 32.5 30.0 - 36.0 g/dL   RDW 79.2 (H) 88.4 - 84.4 %   Platelets 124 (L) 150 - 400 K/uL   nRBC 0.3 (H) 0.0 - 0.2 %    Comment: Performed at [facility] Lab, [address], [place], [place] [address]  Comprehensive metabolic panel with GFR     Status: Abnormal   Collection Time: 01/20/24  4:36 AM  Result Value Ref Range   Sodium 134 (L) 135 - 145 mmol/L   Potassium 3.5 3.5 - 5.1 mmol/L   Chloride 92 (L) 98 - 111 mmol/L   CO2 27 22 - 32 mmol/L   Glucose, Bld 159 (H) 70 - 99 mg/dL    Comment: Glucose reference range applies only to samples taken after fasting for at least 8 hours.   BUN 16 8 - 23 mg/dL   Creatinine, Ser 8.90 (H) 0.44 - 1.00 mg/dL   Calcium 8.2 (L) 8.9 - 10.3 mg/dL   Total Protein 5.7 (L) 6.5 - 8.1 g/dL   Albumin 2.7 (L) 3.5 - 5.0 g/dL   AST 70 (H) 15 - 41 U/L   ALT 131 (H) 0 - 44 U/L   Alkaline Phosphatase 147 (H) 38 - 126 U/L   Total Bilirubin 2.0 (H) 0.0 - 1.2 mg/dL   GFR, Estimated 55 (L) >60 mL/min    Comment: (NOTE) Calculated using the CKD-EPI Creatinine Equation (2021)    Anion gap 15 5 - 15    Comment: Performed at [facility], [address] [place], [place] [address]  Glucose, capillary     Status: Abnormal   Collection Time: 01/20/24  6:07 AM  Result Value Ref Range   Glucose-Capillary 165 (H) 70 - 99 mg/dL    Comment: Glucose reference range applies only to samples taken after fasting for at least 8 hours.  Cooxemetry Panel (carboxy, met, total hgb, O2 sat)     Status: Abnormal   Collection Time: 01/20/24  7:02 AM  Result Value Ref Range   Total hemoglobin 11.6 (L) 12.0 - 16.0 g/dL   O2 Saturation 53.3 %   Carboxyhemoglobin 1.5 0.5 - 1.5 %   Methemoglobin <0.7 0.0 - 1.5 %    Comment: Performed at [facility] Lab, [address] [place], [place] [address]  Glucose, capillary     Status: Abnormal    Collection Time: 01/20/24 11:06 AM  Result Value Ref Range   Glucose-Capillary 139 (H) 70 - 99 mg/dL    Comment: Glucose reference range applies only to samples taken after fasting for at least 8 hours.  Basic metabolic panel  Status: Abnormal   Collection Time: 01/20/24 12:34 PM  Result Value Ref Range   Sodium 132 (L) 135 - 145 mmol/L   Potassium 3.9 3.5 - 5.1 mmol/L   Chloride 90 (L) 98 - 111 mmol/L   CO2 28 22 - 32 mmol/L   Glucose, Bld 170 (H) 70 - 99 mg/dL    Comment: Glucose reference range applies only to samples taken after fasting for at least 8 hours.   BUN 17 8 - 23 mg/dL   Creatinine, Ser 8.75 (H) 0.44 - 1.00 mg/dL   Calcium 8.2 (L) 8.9 - 10.3 mg/dL   GFR, Estimated 47 (L) >60 mL/min    Comment: (NOTE) Calculated using the CKD-EPI Creatinine Equation (2021)    Anion gap 14 5 - 15    Comment: Performed at [facility] Lab, [address] [place], [place] [address]  Cooxemetry Panel (carboxy, met, total hgb, O2 sat)     Status: Abnormal   Collection Time: 01/20/24 12:34 PM  Result Value Ref Range   Total hemoglobin 11.3 (L) 12.0 - 16.0 g/dL   O2 Saturation 59.6 %   Carboxyhemoglobin 1.8 (H) 0.5 - 1.5 %   Methemoglobin 1.6 (H) 0.0 - 1.5 %    Comment: Performed at [facility] Lab, [address], [place], [place] [address]  Glucose, capillary     Status: Abnormal   Collection Time: 01/20/24  3:55 PM  Result Value Ref Range   Glucose-Capillary 108 (H) 70 - 99 mg/dL    Comment: Glucose reference range applies only to samples taken after fasting for at least 8 hours.  Glucose, capillary     Status: Abnormal   Collection Time: 01/20/24  9:15 PM  Result Value Ref Range   Glucose-Capillary 124 (H) 70 - 99 mg/dL    Comment: Glucose reference range applies only to samples taken after fasting for at least 8 hours.  Cooxemetry Panel (carboxy, met, total hgb, O2 sat)     Status: Abnormal   Collection Time: 01/21/24  4:19 AM  Result Value Ref Range   Total hemoglobin  10.8 (L) 12.0 - 16.0 g/dL   O2 Saturation 39.1 %   Carboxyhemoglobin 2.0 (H) 0.5 - 1.5 %   Methemoglobin <0.7 0.0 - 1.5 %    Comment: Performed at [facility] Lab, [address], [place], [place] [address]  Magnesium     Status: None   Collection Time: 01/21/24  4:19 AM  Result Value Ref Range   Magnesium 2.1 1.7 - 2.4 mg/dL    Comment: Performed at [facility], [address] [place], [place] [address]  Comprehensive metabolic panel with GFR     Status: Abnormal   Collection Time: 01/21/24  4:19 AM  Result Value Ref Range   Sodium 133 (L) 135 - 145 mmol/L   Potassium 3.8 3.5 - 5.1 mmol/L   Chloride 93 (L) 98 - 111 mmol/L   CO2 27 22 - 32 mmol/L   Glucose, Bld 117 (H) 70 - 99 mg/dL    Comment: Glucose reference range applies only to samples taken after fasting for at least 8 hours.   BUN 18 8 - 23 mg/dL   Creatinine, Ser 8.60 (H) 0.44 - 1.00 mg/dL   Calcium 8.4 (L) 8.9 - 10.3 mg/dL   Total Protein 5.9 (L) 6.5 - 8.1 g/dL   Albumin 2.6 (L) 3.5 - 5.0 g/dL   AST 54 (H) 15 - 41 U/L   ALT 98 (  H) 0 - 44 U/L   Alkaline Phosphatase 142 (H) 38 - 126 U/L   Total Bilirubin 2.1 (H) 0.0 - 1.2 mg/dL   GFR, Estimated 41 (L) >60 mL/min    Comment: (NOTE) Calculated using the CKD-EPI Creatinine Equation (2021)    Anion gap 13 5 - 15    Comment: Performed at [facility] Lab, [address] [place], [place] [address]  Glucose, capillary     Status: Abnormal   Collection Time: 01/21/24  6:23 AM  Result Value Ref Range   Glucose-Capillary 122 (H) 70 - 99 mg/dL    Comment: Glucose reference range applies only to samples taken after fasting for at least 8 hours.  CBC     Status: Abnormal   Collection Time: 01/21/24  9:13 AM  Result Value Ref Range   WBC 7.2 4.0 - 10.5 K/uL   RBC 3.32 (L) 3.87 - 5.11 MIL/uL   Hemoglobin 10.3 (L) 12.0 - 15.0 g/dL   HCT 68.2 (L) 63.9 - 53.9 %   MCV 95.5 80.0 - 100.0 fL   MCH 31.0 26.0 - 34.0 pg   MCHC 32.5 30.0 - 36.0 g/dL   RDW 78.9 (H) 88.4 -  15.5 %   Platelets 118 (L) 150 - 400 K/uL   nRBC 0.0 0.0 - 0.2 %    Comment: Performed at [facility] Lab, [address], [place], [place] [address]  Glucose, capillary     Status: Abnormal   Collection Time: 01/21/24 11:33 AM  Result Value Ref Range   Glucose-Capillary 147 (H) 70 - 99 mg/dL    Comment: Glucose reference range applies only to samples taken after fasting for at least 8 hours.    No results found.  ROS:ROS  Blood pressure (!) 112/55, pulse 78, temperature 97.6 F (36.4 C), temperature source Oral, resp. rate 12, height 5' 4 (1.626 m), weight 68.7 kg, SpO2 (!) 88%.  PHYSICAL EXAM:  CONSTITUTIONAL: well developed, nourished, no distress and alert and oriented x 3 CARDIOVASCULAR: normal rate PULMONARY/CHEST WALL: effort normal HENT: Head : normocephalic and atraumatic Nose: clot in left nasal cavity, small, no active bleeding.  Mouth/Throat:  no bleeding  NECK: supple  Studies Reviewed: CBC 01/21/24 hgb 10.3, platelet 118  Assessment/Plan: Tracy Baker is an 68 y.o. female  who is POD11 after mitral valve repair on aspirin and eliquis who has had recurrent epistaxis on the floor for which ENT is consulted. Aspirin has since been discontinued, still on eliquis. Hemostatic at bedside.   Discussed the nature of epistaxis in setting of anticoagulation and she may continue to bleed and this is OK as long as we are able to stop the episodes. Reassuring that last episode stopped with conservative measures. Discussed continued observation and afrin/pressure to treat bleeding versus treating with surgiflow. Discussed that surgiflow will cause nasal obstruction which pt does not want bilaterally. Since bleeding from left side opted for surgiflow to the left nasal cavity.   Discussed surgiflow will dissolve on its own and does not require removal.    Instructions to help prevent future episodes of nose bleeds and management: - Reduce nasal manipulation - no nose  picking, no tissues in the nose other than for dabbing.  - Use Afrin nasal spray for ACTIVE bleeding on the bleeding side and hold firm, continuous nasal pressure (on nostril - soft part of the nose, not the bone)  for 15 mins continuously for ACTIVE bleeding.  - Maintain tight BP control, as  high blood pressure will certainly increase epistaxis occurrences.   Call ENT with any additional questions or concerns    01/21/2024, 1:07 PM

## 2024-01-21 NOTE — Progress Notes (Signed)
 Inpatient Rehab Admissions Coordinator:    CIR following. She is not yet medically ready for transfer. I did speak with her briefly and she states her landlady and daughter can assist her PRN when she goes home.   Leita Kleine, MS, CCC-SLP Rehab Admissions Coordinator  [phone] (celll) [phone] (office)

## 2024-01-22 ENCOUNTER — Inpatient Hospital Stay (HOSPITAL_COMMUNITY)

## 2024-01-22 ENCOUNTER — Inpatient Hospital Stay

## 2024-01-22 ENCOUNTER — Encounter (HOSPITAL_COMMUNITY)
Admission: EM | Disposition: A | Discharge status: Rehabilitation Facility | Payer: Self-pay | Source: Home / Self Care | Attending: Thoracic Surgery (Cardiothoracic Vascular Surgery)

## 2024-01-22 DIAGNOSIS — R570 Cardiogenic shock: Secondary | ICD-10-CM | POA: Diagnosis not present

## 2024-01-22 DIAGNOSIS — I340 Nonrheumatic mitral (valve) insufficiency: Secondary | ICD-10-CM

## 2024-01-22 DIAGNOSIS — I4891 Unspecified atrial fibrillation: Secondary | ICD-10-CM | POA: Diagnosis not present

## 2024-01-22 DIAGNOSIS — R040 Epistaxis: Secondary | ICD-10-CM

## 2024-01-22 DIAGNOSIS — B377 Candidal sepsis: Secondary | ICD-10-CM | POA: Diagnosis not present

## 2024-01-22 HISTORY — PX: TRANSESOPHAGEAL ECHOCARDIOGRAM (CATH LAB): EP1270

## 2024-01-22 HISTORY — PX: CARDIOVERSION: EP1203

## 2024-01-22 LAB — MAGNESIUM: Magnesium: 1.7 mg/dL (ref 1.7–2.4)

## 2024-01-22 LAB — COMPREHENSIVE METABOLIC PANEL WITH GFR
ALT: 77 U/L — ABNORMAL HIGH (ref 0–44)
AST: 47 U/L — ABNORMAL HIGH (ref 15–41)
Albumin: 2.8 g/dL — ABNORMAL LOW (ref 3.5–5.0)
Alkaline Phosphatase: 137 U/L — ABNORMAL HIGH (ref 38–126)
Anion gap: 15 (ref 5–15)
BUN: 19 mg/dL (ref 8–23)
CO2: 28 mmol/L (ref 22–32)
Calcium: 8.6 mg/dL — ABNORMAL LOW (ref 8.9–10.3)
Chloride: 88 mmol/L — ABNORMAL LOW (ref 98–111)
Creatinine, Ser: 1.5 mg/dL — ABNORMAL HIGH (ref 0.44–1.00)
GFR, Estimated: 38 mL/min — ABNORMAL LOW (ref >60)
Glucose, Bld: 173 mg/dL — ABNORMAL HIGH (ref 70–99)
Potassium: 3.5 mmol/L (ref 3.5–5.1)
Sodium: 131 mmol/L — ABNORMAL LOW (ref 135–145)
Total Bilirubin: 2 mg/dL — ABNORMAL HIGH (ref 0.0–1.2)
Total Protein: 5.9 g/dL — ABNORMAL LOW (ref 6.5–8.1)

## 2024-01-22 LAB — CBC
HCT: 32.7 % — ABNORMAL LOW (ref 36.0–46.0)
Hemoglobin: 10.7 g/dL — ABNORMAL LOW (ref 12.0–15.0)
MCH: 31 pg (ref 26.0–34.0)
MCHC: 32.7 g/dL (ref 30.0–36.0)
MCV: 94.8 fL (ref 80.0–100.0)
Platelets: 123 K/uL — ABNORMAL LOW (ref 150–400)
RBC: 3.45 MIL/uL — ABNORMAL LOW (ref 3.87–5.11)
RDW: 20.5 % — ABNORMAL HIGH (ref 11.5–15.5)
WBC: 8.2 K/uL (ref 4.0–10.5)
nRBC: 0 % (ref 0.0–0.2)

## 2024-01-22 LAB — ECHO TEE

## 2024-01-22 LAB — GLUCOSE, CAPILLARY
Glucose-Capillary: 121 mg/dL — ABNORMAL HIGH (ref 70–99)
Glucose-Capillary: 121 mg/dL — ABNORMAL HIGH (ref 70–99)
Glucose-Capillary: 122 mg/dL — ABNORMAL HIGH (ref 70–99)
Glucose-Capillary: 100 mg/dL — ABNORMAL HIGH (ref 70–99)

## 2024-01-22 LAB — COOXEMETRY PANEL
Carboxyhemoglobin: 2.1 % — ABNORMAL HIGH (ref 0.5–1.5)
Methemoglobin: <0.7 % (ref 0.0–1.5)
O2 Saturation: 65.4 %
Total hemoglobin: 10.6 g/dL — ABNORMAL LOW (ref 12.0–16.0)

## 2024-01-22 SURGERY — TRANSESOPHAGEAL ECHOCARDIOGRAM (TEE) (CATHLAB)
Anesthesia: Monitor Anesthesia Care

## 2024-01-22 MED ORDER — PROPOFOL 10 MG/ML IV BOLUS
INTRAVENOUS | Status: DC | PRN
  Administered 2024-01-22: 30 mg via INTRAVENOUS

## 2024-01-22 MED ORDER — POTASSIUM CHLORIDE 10 MEQ/50ML IV SOLN
10.0000 meq | INTRAVENOUS | Status: AC
  Administered 2024-01-22 (×3): 10 meq via INTRAVENOUS
  Filled 2024-01-22 (×3): qty 50

## 2024-01-22 MED ORDER — KETAMINE HCL 50 MG/5ML IJ SOSY
PREFILLED_SYRINGE | INTRAMUSCULAR | Status: AC
  Filled 2024-01-22: qty 5

## 2024-01-22 MED ORDER — PHENYLEPHRINE HCL-NACL 20-0.9 MG/250ML-% IV SOLN
INTRAVENOUS | Status: DC | PRN
  Administered 2024-01-22: 25 ug/min via INTRAVENOUS

## 2024-01-22 MED ORDER — POTASSIUM CHLORIDE CRYS ER 20 MEQ PO TBCR
30.0000 meq | EXTENDED_RELEASE_TABLET | Freq: Once | ORAL | Status: AC
  Administered 2024-01-22: 30 meq via ORAL
  Filled 2024-01-22: qty 1

## 2024-01-22 MED ORDER — PROPOFOL 500 MG/50ML IV EMUL
INTRAVENOUS | Status: DC | PRN
  Administered 2024-01-22: 100 ug/kg/min via INTRAVENOUS

## 2024-01-22 SURGICAL SUPPLY — 1 items: PAD DEFIB RADIO PHYSIO CONN (PAD) ×1 IMPLANT

## 2024-01-22 NOTE — Progress Notes (Signed)
 12 Days Post-Op Procedure(s) (LRB): REPAIR, MITRAL VALVE (N/A) ECHOCARDIOGRAM, TRANSESOPHAGEAL, INTRAOPERATIVE (N/A) Subjective: Nervous about cardioversion C/o dry mouth and skin  Objective: Vital signs in last 24 hours: Temp:  [97.5 F (36.4 C)-97.9 F (36.6 C)] 97.9 F (36.6 C) (11/18 0300) Pulse Rate:  [68-93] 73 (11/18 0600) Cardiac Rhythm: Atrial fibrillation (11/17 1932) Resp:  [10-32] 12 (11/18 0600) BP: (91-131)/(51-82) 98/60 (11/18 0600) SpO2:  [86 %-100 %] 86 % (11/18 0600) Weight:  [66.9 kg] 66.9 kg (11/18 0500)  Hemodynamic parameters for last 24 hours: CVP:  [0 mmHg-16 mmHg] 1 mmHg  Intake/Output from previous day: 11/17 0701 - 11/18 0700 In: 1734.4 [P.O.:500; I.V.:514.4; IV Piggyback:720] Out: 3725 [Urine:3725] Intake/Output this shift: No intake/output data recorded.  General appearance: alert, cooperative, and no distress Neurologic: intact Heart: irregularly irregular rhythm Lungs: clear to auscultation bilaterally Wound: clean  Lab Results: Recent Labs    01/20/24 0436 01/21/24 0913  WBC 7.5 7.2  HGB 10.3* 10.3*  HCT 31.7* 31.7*  PLT 124* 118*   BMET:  Recent Labs    01/21/24 0419 01/22/24 0447  NA 133* 131*  K 3.8 3.5  CL 93* 88*  CO2 27 28  GLUCOSE 117* 173*  BUN 18 19  CREATININE 1.39* 1.50*  CALCIUM 8.4* 8.6*    PT/INR: No results for input(s): LABPROT, INR in the last 72 hours. ABG    Component Value Date/Time   PHART 7.387 01/11/2024 0856   HCO3 19.3 (L) 01/11/2024 0856   TCO2 20 (L) 01/11/2024 0856   ACIDBASEDEF 5.0 (H) 01/11/2024 0856   O2SAT 65.4 01/22/2024 0505   CBG (last 3)  Recent Labs    01/21/24 1549 01/21/24 2112 01/22/24 0624  GLUCAP 102* 135* 121*    Assessment/Plan: S/P Procedure(s) (LRB): REPAIR, MITRAL VALVE (N/A) ECHOCARDIOGRAM, TRANSESOPHAGEAL, INTRAOPERATIVE (N/A) - NEURO- intact CV- co-ox 64 on milrinone  CVP 0-3  A fib with controlled rate- on amiodarone  For DCCV today  On  Eliquis No further nose bleeds RESP- no issues RENAL- she has been overdiuresed  Creatinine up to 1.5  10L negative over past 2 weeks and weight below admission ENDO- CBG mildly elevted GI- tolerating diet Deconditioning- continue to mobilize ID Fluconazole for Candida  LOS: 20 days    [name] 01/22/2024

## 2024-01-22 NOTE — Progress Notes (Signed)
 TCTS PM Rounding Progress Note  Underwent TEE today, no cardioversion Remains hemodynamically stable  Vitals:   01/22/24 1410 01/22/24 1500  BP: 119/72 119/79  Pulse: 68 73  Resp: 15 13  Temp:    SpO2: 92% 93%    Plan: - Cont amio, milrinone  Con Clunes, MD Cardiothoracic Surgery Pager: [phone]

## 2024-01-22 NOTE — Transfer of Care (Signed)
 Immediate Anesthesia Transfer of Care Note  Patient: Zamani Crocker  Procedure(s) Performed: TRANSESOPHAGEAL ECHOCARDIOGRAM CARDIOVERSION  Patient Location: PACU  Anesthesia Type:MAC  Level of Consciousness: drowsy  Airway & Oxygen Therapy: Patient Spontanous Breathing and Patient connected to face mask oxygen  Post-op Assessment: Report given to RN and Post -op Vital signs reviewed and stable  Post vital signs: Reviewed and stable  Last Vitals:  Vitals Value Taken Time  BP 115/62 01/22/24 12:45  Temp 36.7 C 01/22/24 11:45  Pulse 75 01/22/24 13:40  Resp 16 01/22/24 13:40  SpO2 97 % 01/22/24 13:40  Vitals shown include unfiled device data.  Last Pain:  Vitals:   01/22/24 1145  TempSrc: Temporal  PainSc: 0-No pain      Patients Stated Pain Goal: 0 (01/21/24 1932)  Complications: No notable events documented.

## 2024-01-22 NOTE — Progress Notes (Signed)
  Inpatient Rehabilitation Admissions Coordinator   As per my assessment 11/12. Daughter, [name] and family to arrange 24/7 supervision when patient discharges home, not the landlady. I await medical readiness before beginning Auth with [facility] Medicare for a possible [facility] admit.  Heron Leavell, RN, MSN Rehab Admissions Coordinator [phone] 01/22/2024 8:39 AM

## 2024-01-22 NOTE — Progress Notes (Addendum)
 Patient ID: Tracy Baker, female   DOB: 1955/06/10, 68 y.o.   MRN: [id number]     Advanced Heart Failure Rounding Note  Cardiologist: Lonni Cash, MD  Chief Complaint: S/p MV Repair / MV endocarditis   Patient Profile   Tracy Baker is a 68 y.o. female with hx of recent nephrolithiasis s/p ureteral stent and lithotripsy at [facility] 10/10 with stent removal. 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 Post-op course c/b post cardiotomy shock, junctional bradycardia, afib w/ RVR and HAP.   Subjective:    POD #112 s/p MV repair for candidal MV endocarditis.   Co-ox 65% on 0.25 milrinone.   CVP 5. -2L last 24 hrs, on 40 mg torsemide BID.   No recurrent epistaxis. Continues on eliquis. Remains in AF with controlled rate on amiodarone gtt.  On cefepime + linezolid empirically for aspiration PNA. No fever.  Feels okay. Ambulated three times yesterday. Appetite still isn't great, eats a little bit of what family brings her.   Objective:   Weight Range: 66.9 kg Body mass index is 25.32 kg/m.   Vital Signs:   Temp:  [97.5 F (36.4 C)-97.9 F (36.6 C)] 97.9 F (36.6 C) (11/18 0742) Pulse Rate:  [68-93] 73 (11/18 0600) Resp:  [10-32] 12 (11/18 0600) BP: (91-131)/(51-82) 98/60 (11/18 0600) SpO2:  [86 %-100 %] 86 % (11/18 0600) Weight:  [66.9 kg] 66.9 kg (11/18 0500) Last BM Date : 01/20/24  Weight change: Filed Weights   01/20/24 0500 01/21/24 0500 01/22/24 0500  Weight: 69.4 kg 68.7 kg 66.9 kg    Intake/Output:   Intake/Output Summary (Last 24 hours) at 01/22/2024 0818 Last data filed at 01/22/2024 0644 Gross per 24 hour  Intake 1689.56 ml  Output 3725 ml  Net -2035.44 ml    Physical Exam   Vitals:   01/22/24 0600 01/22/24 0742  BP: 98/60   Pulse: 73   Resp: 12   Temp:  97.9 F (36.6 C)  SpO2: (!) 86%    General:  Sitting up in chair. Fatigued appearing. Cor: JVP not elevated. Irregular rhythm. No murmur. Lungs:  clear Abdomen: soft, nontender, nondistended. Extremities: trace edema Neuro: alert & orientedx3. Affect pleasant   Telemetry   Afib 70s  Labs    CBC Recent Labs    01/20/24 0436 01/21/24 0913  WBC 7.5 7.2  HGB 10.3* 10.3*  HCT 31.7* 31.7*  MCV 94.6 95.5  PLT 124* 118*   Basic Metabolic Panel Recent Labs    88/82/74 0419 01/22/24 0447  NA 133* 131*  K 3.8 3.5  CL 93* 88*  CO2 27 28  GLUCOSE 117* 173*  BUN 18 19  CREATININE 1.39* 1.50*  CALCIUM 8.4* 8.6*  MG 2.1 1.7   Liver Function Tests Recent Labs    01/21/24 0419 01/22/24 0447  AST 54* 47*  ALT 98* 77*  ALKPHOS 142* 137*  BILITOT 2.1* 2.0*  PROT 5.9* 5.9*  ALBUMIN 2.6* 2.8*   Medications:   Scheduled Medications:  apixaban  5 mg Oral BID   Chlorhexidine Gluconate Cloth  6 each Topical Daily   digoxin  0.125 mg Oral Daily   docusate sodium  200 mg Oral Daily   feeding supplement  237 mL Oral BID BM   insulin aspart  0-15 Units Subcutaneous TID WC   insulin aspart  0-5 Units Subcutaneous QHS   oxymetazoline  1 spray Each Nare BID   pantoprazole  (PROTONIX ) IV  40 mg Intravenous Daily  scopolamine  2 patch Transdermal Q72H   sodium chloride  flush  10-40 mL Intracatheter Q12H   sodium chloride  flush  3 mL Intravenous Q12H   spironolactone  25 mg Oral Daily    Infusions:  sodium chloride      amiodarone 30 mg/hr (01/22/24 0600)   fluconazole (DIFLUCAN) IV Stopped (01/21/24 1707)   milrinone 0.25 mcg/kg/min (01/22/24 0600)   potassium chloride 10 mEq (01/22/24 0646)    PRN Medications: sodium chloride , bisacodyl **OR** [DISCONTINUED] bisacodyl, clonazePAM , fentaNYL  (SUBLIMAZE ) injection, hydrocortisone cream, HYDROmorphone, ondansetron  (ZOFRAN ) IV, mouth rinse, sodium chloride , sodium chloride  flush, sodium chloride  flush   Assessment/Plan   1. MV Endocarditis d/t Candida Albicans candidemia/fungemia, S/p MV repair w/ Post-cardiotomy Shock  - s/p MV repair 01/10/24 by Dr. Kerrin -  Pre-op [facility] 01/04/24 with no CAD and normal L/R heart pressures.  - Intra-op TEE LVEF 60-65%, RV mildly reduced, small immobile mass on anterior valve leaflet, mod MR, trivial TR - Failed initial Milrinone wean. Echo 11/13 per Dr. Rolan review: EF 60-65%, moderate RV dysfunction, s/p MV repair with probably moderate mitral regurgitation.  Milrinone restarted.  - Unable to wean milrinone, milrinone increased from 0.125 to 0.25 on 11/16 d/t CO-OX of 40%.  - Co-ox 65% this am on 0.25 milrinone. Will attempt to wean again. Decrease milrinone to 0.125 mcg/kg/min. Hopefully able to get off milrinone once we restore SR. - Continue digoxin.  - CVP 5. Diuresed well with po Torsemide yesterday. She feels dry. Now at pre-op weight. Hold diuretics today.  - Continue spiro 25 mg daily  2. Transient Junctional Nola  - Now in atrial fibrillation, on amiodarone 30 mg/hr.    3. Atrial fibrillation - Currently in AF rate 80s => on amiodarone gtt 30 mg/hr.  - Continue apixaban.  - Plan for TEE/DCCV today  4. ?HAP/aspiration  - covering w/ cefepime + linezolid, end date 11/19 - afebrile  5. 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   7. Elevated LFTs - Marked jump in LFTs 11/8. Now improving.  8. Episatxis - Recurrent. Now off aspirin, remains on eliquis. - Seen by ENT  Continue aggressive PT/OT. Possible CIR once ready for discharge.   FINCH, LINDSAY N 01/22/2024 8:18 AM  Patient seen with PA, I formulated the plan and agree with the above note.   Good diuresis today, she is down to pre-op weight.  CVP 5.  Co-ox 65%.  Creatinine 1.39 => 1.5. Still on milrinone 0.25.   Walked yesterday, breathing better.   General: NAD Neck: No JVD, no thyromegaly or thyroid  nodule.  Lungs: Clear to auscultation bilaterally with normal respiratory effort. CV: Nondisplaced PMI.  Heart irregular S1/S2, no S3/S4, no  murmur.  No peripheral edema.    Abdomen: Soft, nontender, no hepatosplenomegaly, no distention.  Skin: Intact without lesions or rashes.  Neurologic: Alert and oriented x 3.  Psych: Normal affect. Extremities: No clubbing or cyanosis.  HEENT: Normal.   Good diuresis yesterday, CVP 5.  Weight down to baseline.  Hold diuretics today, will likely need to resume tomorrow.    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  I hope that cardioversion to restore NSR will help cardiac output.   - Decreased milrinone to 0.125 today.  - Continue amiodarone gtt + apixaban.  - TEE-guided DCCV today, discussed risks/benefits with patient and she agrees to procedure.  Mobilize.   CRITICAL CARE Performed by: Ezra Shuck  Total critical care time: 35 minutes  Critical care time was exclusive of separately billable procedures and treating other patients.  Critical care was necessary to treat or prevent imminent or life-threatening deterioration.  Critical care was time spent personally by me on the following activities: development of treatment plan with patient and/or surrogate as well as nursing, discussions with consultants, evaluation of patient's response to treatment, examination of patient, obtaining history from patient or surrogate, ordering and performing treatments and interventions, ordering and review of laboratory studies, ordering and review of radiographic studies, pulse oximetry and re-evaluation of patient's condition.   Ezra Shuck 01/22/2024 8:42 AM

## 2024-01-22 NOTE — Anesthesia Postprocedure Evaluation (Signed)
 Anesthesia Post Note  Patient: Tracy Baker  Procedure(s) Performed: TRANSESOPHAGEAL ECHOCARDIOGRAM CARDIOVERSION     Patient location during evaluation: PACU Anesthesia Type: MAC Level of consciousness: awake and alert Pain management: pain level controlled Vital Signs Assessment: post-procedure vital signs reviewed and stable Respiratory status: spontaneous breathing, nonlabored ventilation, respiratory function stable and patient connected to nasal cannula oxygen Cardiovascular status: blood pressure returned to baseline and stable Postop Assessment: no apparent nausea or vomiting Anesthetic complications: no   No notable events documented.  Last Vitals:  Vitals:   01/22/24 1400 01/22/24 1410  BP: 117/61 119/72  Pulse: 76 68  Resp: 15 15  Temp:    SpO2: 93% 92%    Last Pain:  Vitals:   01/22/24 1410  TempSrc:   PainSc: 0-No pain                 Thom JONELLE Peoples

## 2024-01-22 NOTE — CV Procedure (Signed)
 Procedure: TEE  Sedation: Per anesthesiology  Indication: Mitral regurgitation  Findings: Please see echo section for full report.  There was still severe mitral regurgitation.  I did not cardiovert her given severity of MR and she is still on some milrinone.  I do not think that she would hold sinus rhythm.  Ezra Shuck 01/22/2024 2:08 PM

## 2024-01-22 NOTE — Anesthesia Preprocedure Evaluation (Addendum)
 Anesthesia Evaluation  Patient identified by MRN, date of birth, ID band Patient awake    Reviewed: Allergy & Precautions, NPO status , Patient's Chart, lab work & pertinent test results  History of Anesthesia Complications Negative for: history of anesthetic complications  Airway Mallampati: II  TM Distance: >3 FB Neck ROM: Full    Dental no notable dental hx.    Pulmonary Current Smoker and Patient abstained from smoking.   Pulmonary exam normal breath sounds clear to auscultation       Cardiovascular (-) angina (-) Past MI Normal cardiovascular exam Rhythm:Regular Rate:Normal  Hx of candidemia, candidal endocarditis s/p surgical resection of mitral veg  TTE: IMPRESSIONS     1. Left ventricular ejection fraction, by estimation, is 60 to 65%. The  left ventricle has normal function. The left ventricle has no regional  wall motion abnormalities. Left ventricular diastolic function could not  be evaluated.   2. Right ventricular systolic function is normal. The right ventricular  size is normal.   3. The mitral valve has been repaired/replaced. No evidence of mitral  valve regurgitation. No evidence of mitral stenosis. There is a  bioprosthetic valve present in the mitral position. Procedure Date:  01/11/24.   4. The aortic valve is normal in structure. Aortic valve regurgitation is  trivial. No aortic stenosis is present.   5. The inferior vena cava is normal in size with greater than 50%  respiratory variability, suggesting right atrial pressure of 3 mmHg.     Neuro/Psych neg Seizures    GI/Hepatic Neg liver ROS,GERD  Medicated,,  Endo/Other  negative endocrine ROS    Renal/GU negative Renal ROS     Musculoskeletal   Abdominal   Peds  Hematology negative hematology ROS (+)   Anesthesia Other Findings   Reproductive/Obstetrics                              Anesthesia  Physical Anesthesia Plan  ASA: 3  Anesthesia Plan: MAC   Post-op Pain Management:    Induction: Intravenous  PONV Risk Score and Plan: 2 and Treatment may vary due to age or medical condition  Airway Management Planned: Natural Airway and Simple Face Mask  Additional Equipment: None  Intra-op Plan:   Post-operative Plan:   Informed Consent: I have reviewed the patients History and Physical, chart, labs and discussed the procedure including the risks, benefits and alternatives for the proposed anesthesia with the patient or authorized representative who has indicated his/her understanding and acceptance.     Dental advisory given  Plan Discussed with: CRNA  Anesthesia Plan Comments:          Anesthesia Quick Evaluation

## 2024-01-22 NOTE — Plan of Care (Signed)
 Problem: Education: Goal: Knowledge of General Education information will improve Description: Including pain rating scale, medication(s)/side effects and non-pharmacologic comfort measures Outcome: Progressing   Problem: Health Behavior/Discharge Planning: Goal: Ability to manage health-related needs will improve Outcome: Progressing   Problem: Clinical Measurements: Goal: Ability to maintain clinical measurements within normal limits will improve Outcome: Progressing Goal: Will remain free from infection Outcome: Progressing Goal: Diagnostic test results will improve Outcome: Progressing Goal: Respiratory complications will improve Outcome: Progressing Goal: Cardiovascular complication will be avoided Outcome: Progressing   Problem: Activity: Goal: Risk for activity intolerance will decrease Outcome: Progressing   Problem: Nutrition: Goal: Adequate nutrition will be maintained Outcome: Progressing   Problem: Coping: Goal: Level of anxiety will decrease Outcome: Progressing   Problem: Elimination: Goal: Will not experience complications related to bowel motility Outcome: Progressing Goal: Will not experience complications related to urinary retention Outcome: Progressing   Problem: Pain Managment: Goal: General experience of comfort will improve and/or be controlled Outcome: Progressing   Problem: Safety: Goal: Ability to remain free from injury will improve Outcome: Progressing   Problem: Skin Integrity: Goal: Risk for impaired skin integrity will decrease Outcome: Progressing   Problem: Education: Goal: Understanding of CV disease, CV risk reduction, and recovery process will improve Outcome: Progressing Goal: Individualized Educational Video(s) Outcome: Progressing   Problem: Activity: Goal: Ability to return to baseline activity level will improve Outcome: Progressing   Problem: Cardiovascular: Goal: Ability to achieve and maintain adequate  cardiovascular perfusion will improve Outcome: Progressing Goal: Vascular access site(s) Level 0-1 will be maintained Outcome: Progressing   Problem: Health Behavior/Discharge Planning: Goal: Ability to safely manage health-related needs after discharge will improve Outcome: Progressing   Problem: Education: Goal: Will demonstrate proper wound care and an understanding of methods to prevent future damage Outcome: Progressing Goal: Knowledge of disease or condition will improve Outcome: Progressing Goal: Knowledge of the prescribed therapeutic regimen will improve Outcome: Progressing Goal: Individualized Educational Video(s) Outcome: Progressing   Problem: Activity: Goal: Risk for activity intolerance will decrease Outcome: Progressing   Problem: Cardiac: Goal: Will achieve and/or maintain hemodynamic stability Outcome: Progressing   Problem: Clinical Measurements: Goal: Postoperative complications will be avoided or minimized Outcome: Progressing   Problem: Respiratory: Goal: Respiratory status will improve Outcome: Progressing   Problem: Skin Integrity: Goal: Wound healing without signs and symptoms of infection Outcome: Progressing Goal: Risk for impaired skin integrity will decrease Outcome: Progressing   Problem: Urinary Elimination: Goal: Ability to achieve and maintain adequate renal perfusion and functioning will improve Outcome: Progressing   Problem: Education: Goal: Ability to describe self-care measures that may prevent or decrease complications (Diabetes Survival Skills Education) will improve Outcome: Progressing Goal: Individualized Educational Video(s) Outcome: Progressing   Problem: Coping: Goal: Ability to adjust to condition or change in health will improve Outcome: Progressing   Problem: Fluid Volume: Goal: Ability to maintain a balanced intake and output will improve Outcome: Progressing   Problem: Health Behavior/Discharge  Planning: Goal: Ability to identify and utilize available resources and services will improve Outcome: Progressing Goal: Ability to manage health-related needs will improve Outcome: Progressing   Problem: Metabolic: Goal: Ability to maintain appropriate glucose levels will improve Outcome: Progressing   Problem: Nutritional: Goal: Maintenance of adequate nutrition will improve Outcome: Progressing Goal: Progress toward achieving an optimal weight will improve Outcome: Progressing   Problem: Skin Integrity: Goal: Risk for impaired skin integrity will decrease Outcome: Progressing   Problem: Tissue Perfusion: Goal: Adequacy of tissue perfusion will improve Outcome: Progressing

## 2024-01-22 NOTE — Interval H&P Note (Signed)
 History and Physical Interval Note:  01/22/2024 12:37 PM  Tracy Baker  has presented today for surgery, with the diagnosis of afib.  The various methods of treatment have been discussed with the patient and family. After consideration of risks, benefits and other options for treatment, the patient has consented to  Procedure(s): TRANSESOPHAGEAL ECHOCARDIOGRAM (N/A) CARDIOVERSION (N/A) as a surgical intervention.  The patient's history has been reviewed, patient examined, no change in status, stable for surgery.  I have reviewed the patient's chart and labs.  Questions were answered to the patient's satisfaction.     Avinash Maltos [employer]

## 2024-01-22 NOTE — Progress Notes (Signed)
  Echocardiogram Echocardiogram Transesophageal has been performed.  Tracy Baker, RDCS 01/22/2024, 1:58 PM

## 2024-01-22 NOTE — H&P (View-Only) (Signed)
 Patient ID: Tracy Baker, female   DOB: 1955/06/10, 68 y.o.   MRN: [id number]     Advanced Heart Failure Rounding Note  Cardiologist: Lonni Cash, MD  Chief Complaint: S/p MV Repair / MV endocarditis   Patient Profile   Tracy Baker is a 68 y.o. female with hx of recent nephrolithiasis s/p ureteral stent and lithotripsy at [facility] 10/10 with stent removal. 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 Post-op course c/b post cardiotomy shock, junctional bradycardia, afib w/ RVR and HAP.   Subjective:    POD #112 s/p MV repair for candidal MV endocarditis.   Co-ox 65% on 0.25 milrinone.   CVP 5. -2L last 24 hrs, on 40 mg torsemide BID.   No recurrent epistaxis. Continues on eliquis. Remains in AF with controlled rate on amiodarone gtt.  On cefepime + linezolid empirically for aspiration PNA. No fever.  Feels okay. Ambulated three times yesterday. Appetite still isn't great, eats a little bit of what family brings her.   Objective:   Weight Range: 66.9 kg Body mass index is 25.32 kg/m.   Vital Signs:   Temp:  [97.5 F (36.4 C)-97.9 F (36.6 C)] 97.9 F (36.6 C) (11/18 0742) Pulse Rate:  [68-93] 73 (11/18 0600) Resp:  [10-32] 12 (11/18 0600) BP: (91-131)/(51-82) 98/60 (11/18 0600) SpO2:  [86 %-100 %] 86 % (11/18 0600) Weight:  [66.9 kg] 66.9 kg (11/18 0500) Last BM Date : 01/20/24  Weight change: Filed Weights   01/20/24 0500 01/21/24 0500 01/22/24 0500  Weight: 69.4 kg 68.7 kg 66.9 kg    Intake/Output:   Intake/Output Summary (Last 24 hours) at 01/22/2024 0818 Last data filed at 01/22/2024 0644 Gross per 24 hour  Intake 1689.56 ml  Output 3725 ml  Net -2035.44 ml    Physical Exam   Vitals:   01/22/24 0600 01/22/24 0742  BP: 98/60   Pulse: 73   Resp: 12   Temp:  97.9 F (36.6 C)  SpO2: (!) 86%    General:  Sitting up in chair. Fatigued appearing. Cor: JVP not elevated. Irregular rhythm. No murmur. Lungs:  clear Abdomen: soft, nontender, nondistended. Extremities: trace edema Neuro: alert & orientedx3. Affect pleasant   Telemetry   Afib 70s  Labs    CBC Recent Labs    01/20/24 0436 01/21/24 0913  WBC 7.5 7.2  HGB 10.3* 10.3*  HCT 31.7* 31.7*  MCV 94.6 95.5  PLT 124* 118*   Basic Metabolic Panel Recent Labs    88/82/74 0419 01/22/24 0447  NA 133* 131*  K 3.8 3.5  CL 93* 88*  CO2 27 28  GLUCOSE 117* 173*  BUN 18 19  CREATININE 1.39* 1.50*  CALCIUM 8.4* 8.6*  MG 2.1 1.7   Liver Function Tests Recent Labs    01/21/24 0419 01/22/24 0447  AST 54* 47*  ALT 98* 77*  ALKPHOS 142* 137*  BILITOT 2.1* 2.0*  PROT 5.9* 5.9*  ALBUMIN 2.6* 2.8*   Medications:   Scheduled Medications:  apixaban  5 mg Oral BID   Chlorhexidine Gluconate Cloth  6 each Topical Daily   digoxin  0.125 mg Oral Daily   docusate sodium  200 mg Oral Daily   feeding supplement  237 mL Oral BID BM   insulin aspart  0-15 Units Subcutaneous TID WC   insulin aspart  0-5 Units Subcutaneous QHS   oxymetazoline  1 spray Each Nare BID   pantoprazole  (PROTONIX ) IV  40 mg Intravenous Daily  scopolamine  2 patch Transdermal Q72H   sodium chloride  flush  10-40 mL Intracatheter Q12H   sodium chloride  flush  3 mL Intravenous Q12H   spironolactone  25 mg Oral Daily    Infusions:  sodium chloride      amiodarone 30 mg/hr (01/22/24 0600)   fluconazole (DIFLUCAN) IV Stopped (01/21/24 1707)   milrinone 0.25 mcg/kg/min (01/22/24 0600)   potassium chloride 10 mEq (01/22/24 0646)    PRN Medications: sodium chloride , bisacodyl **OR** [DISCONTINUED] bisacodyl, clonazePAM , fentaNYL  (SUBLIMAZE ) injection, hydrocortisone cream, HYDROmorphone, ondansetron  (ZOFRAN ) IV, mouth rinse, sodium chloride , sodium chloride  flush, sodium chloride  flush   Assessment/Plan   1. MV Endocarditis d/t Candida Albicans candidemia/fungemia, S/p MV repair w/ Post-cardiotomy Shock  - s/p MV repair 01/10/24 by Dr. Kerrin -  Pre-op [facility] 01/04/24 with no CAD and normal L/R heart pressures.  - Intra-op TEE LVEF 60-65%, RV mildly reduced, small immobile mass on anterior valve leaflet, mod MR, trivial TR - Failed initial Milrinone wean. Echo 11/13 per Dr. Rolan review: EF 60-65%, moderate RV dysfunction, s/p MV repair with probably moderate mitral regurgitation.  Milrinone restarted.  - Unable to wean milrinone, milrinone increased from 0.125 to 0.25 on 11/16 d/t CO-OX of 40%.  - Co-ox 65% this am on 0.25 milrinone. Will attempt to wean again. Decrease milrinone to 0.125 mcg/kg/min. Hopefully able to get off milrinone once we restore SR. - Continue digoxin.  - CVP 5. Diuresed well with po Torsemide yesterday. She feels dry. Now at pre-op weight. Hold diuretics today.  - Continue spiro 25 mg daily  2. Transient Junctional Nola  - Now in atrial fibrillation, on amiodarone 30 mg/hr.    3. Atrial fibrillation - Currently in AF rate 80s => on amiodarone gtt 30 mg/hr.  - Continue apixaban.  - Plan for TEE/DCCV today  4. ?HAP/aspiration  - covering w/ cefepime + linezolid, end date 11/19 - afebrile  5. 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   7. Elevated LFTs - Marked jump in LFTs 11/8. Now improving.  8. Episatxis - Recurrent. Now off aspirin, remains on eliquis. - Seen by ENT  Continue aggressive PT/OT. Possible CIR once ready for discharge.   FINCH, LINDSAY N 01/22/2024 8:18 AM  Patient seen with PA, I formulated the plan and agree with the above note.   Good diuresis today, she is down to pre-op weight.  CVP 5.  Co-ox 65%.  Creatinine 1.39 => 1.5. Still on milrinone 0.25.   Walked yesterday, breathing better.   General: NAD Neck: No JVD, no thyromegaly or thyroid  nodule.  Lungs: Clear to auscultation bilaterally with normal respiratory effort. CV: Nondisplaced PMI.  Heart irregular S1/S2, no S3/S4, no  murmur.  No peripheral edema.    Abdomen: Soft, nontender, no hepatosplenomegaly, no distention.  Skin: Intact without lesions or rashes.  Neurologic: Alert and oriented x 3.  Psych: Normal affect. Extremities: No clubbing or cyanosis.  HEENT: Normal.   Good diuresis yesterday, CVP 5.  Weight down to baseline.  Hold diuretics today, will likely need to resume tomorrow.    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  I hope that cardioversion to restore NSR will help cardiac output.   - Decreased milrinone to 0.125 today.  - Continue amiodarone gtt + apixaban.  - TEE-guided DCCV today, discussed risks/benefits with patient and she agrees to procedure.  Mobilize.   CRITICAL CARE Performed by: Ezra Shuck  Total critical care time: 35 minutes  Critical care time was exclusive of separately billable procedures and treating other patients.  Critical care was necessary to treat or prevent imminent or life-threatening deterioration.  Critical care was time spent personally by me on the following activities: development of treatment plan with patient and/or surrogate as well as nursing, discussions with consultants, evaluation of patient's response to treatment, examination of patient, obtaining history from patient or surrogate, ordering and performing treatments and interventions, ordering and review of laboratory studies, ordering and review of radiographic studies, pulse oximetry and re-evaluation of patient's condition.   Ezra Shuck 01/22/2024 8:42 AM

## 2024-01-22 NOTE — Plan of Care (Signed)
  Problem: Education: Goal: Knowledge of General Education information will improve Description: Including pain rating scale, medication(s)/side effects and non-pharmacologic comfort measures Outcome: Progressing   Problem: Health Behavior/Discharge Planning: Goal: Ability to manage health-related needs will improve Outcome: Progressing   Problem: Clinical Measurements: Goal: Ability to maintain clinical measurements within normal limits will improve Outcome: Progressing Goal: Will remain free from infection Outcome: Progressing Goal: Diagnostic test results will improve Outcome: Progressing   Problem: Nutrition: Goal: Adequate nutrition will be maintained Outcome: Progressing   Problem: Coping: Goal: Level of anxiety will decrease Outcome: Progressing   Problem: Elimination: Goal: Will not experience complications related to bowel motility Outcome: Progressing Goal: Will not experience complications related to urinary retention Outcome: Progressing

## 2024-01-22 NOTE — TOC Progression Note (Signed)
 Transition of Care [facility]) - Progression Note    Patient Details  Name: Tracy Baker MRN: [id number] Date of Birth: [birth year]  Transition [facility] CM/SW Contact  Justina Delcia Czar, RN Phone Number: [phone] 01/22/2024, 2:57 PM  Clinical Narrative:     Patient for TEE/DCCV 01/22/2024. Currently on Milrinone and amiodarone gtts. PT/OT recommended IP rehab. CIR is following for IP rehab. Will start auth once pt is medically stable.   Chart reviewed for discharge readiness, patient not medically stable for d/c. Inpatient CM/CSW will continue to monitor pt's advancement through interdisciplinary progression rounds.   If new pt transition needs arise, MD please place a TOC consult.    Expected Discharge Plan: IP Rehab Facility Barriers to Discharge: Continued Medical Work up    Expected Discharge Plan and Services   Discharge Planning Services: CM Consult Post Acute Care Choice: Skilled Nursing Facility Living arrangements for the past 2 months: Apartment                   Social Drivers of Health (SDOH) Interventions SDOH Screenings   Food Insecurity: No Food Insecurity (12/29/2023)  Housing: Low Risk  (12/29/2023)  Transportation Needs: No Transportation Needs (12/29/2023)  Utilities: Not At Risk (12/29/2023)  Alcohol Screen: Low Risk  (06/08/2022)  Depression (PHQ2-9): Low Risk  (11/08/2023)  Social Connections: Socially Isolated (12/29/2023)  Tobacco Use: High Risk (01/10/2024)    Readmission Risk Interventions     No data to display

## 2024-01-23 ENCOUNTER — Encounter (HOSPITAL_COMMUNITY): Payer: Self-pay | Admitting: Cardiology

## 2024-01-23 ENCOUNTER — Other Ambulatory Visit (HOSPITAL_COMMUNITY): Payer: Self-pay

## 2024-01-23 ENCOUNTER — Telehealth (HOSPITAL_COMMUNITY): Payer: Self-pay

## 2024-01-23 DIAGNOSIS — R570 Cardiogenic shock: Secondary | ICD-10-CM | POA: Diagnosis not present

## 2024-01-23 DIAGNOSIS — B377 Candidal sepsis: Secondary | ICD-10-CM | POA: Diagnosis not present

## 2024-01-23 LAB — BASIC METABOLIC PANEL WITH GFR
Anion gap: 13 (ref 5–15)
Anion gap: 12 (ref 5–15)
Anion gap: 16 — ABNORMAL HIGH (ref 5–15)
BUN: 22 mg/dL (ref 8–23)
BUN: 26 mg/dL — ABNORMAL HIGH (ref 8–23)
BUN: 27 mg/dL — ABNORMAL HIGH (ref 8–23)
CO2: 28 mmol/L (ref 22–32)
CO2: 29 mmol/L (ref 22–32)
CO2: 29 mmol/L (ref 22–32)
Calcium: 8.4 mg/dL — ABNORMAL LOW (ref 8.9–10.3)
Calcium: 8.7 mg/dL — ABNORMAL LOW (ref 8.9–10.3)
Calcium: 8.8 mg/dL — ABNORMAL LOW (ref 8.9–10.3)
Chloride: 93 mmol/L — ABNORMAL LOW (ref 98–111)
Chloride: 91 mmol/L — ABNORMAL LOW (ref 98–111)
Chloride: 89 mmol/L — ABNORMAL LOW (ref 98–111)
Creatinine, Ser: 1.6 mg/dL — ABNORMAL HIGH (ref 0.44–1.00)
Creatinine, Ser: 1.69 mg/dL — ABNORMAL HIGH (ref 0.44–1.00)
Creatinine, Ser: 1.74 mg/dL — ABNORMAL HIGH (ref 0.44–1.00)
GFR, Estimated: 35 mL/min — ABNORMAL LOW (ref >60)
GFR, Estimated: 33 mL/min — ABNORMAL LOW (ref >60)
GFR, Estimated: 32 mL/min — ABNORMAL LOW (ref >60)
Glucose, Bld: 169 mg/dL — ABNORMAL HIGH (ref 70–99)
Glucose, Bld: 166 mg/dL — ABNORMAL HIGH (ref 70–99)
Glucose, Bld: 166 mg/dL — ABNORMAL HIGH (ref 70–99)
Potassium: 3.9 mmol/L (ref 3.5–5.1)
Potassium: 5.4 mmol/L — ABNORMAL HIGH (ref 3.5–5.1)
Potassium: 4 mmol/L (ref 3.5–5.1)
Sodium: 134 mmol/L — ABNORMAL LOW (ref 135–145)
Sodium: 132 mmol/L — ABNORMAL LOW (ref 135–145)
Sodium: 134 mmol/L — ABNORMAL LOW (ref 135–145)

## 2024-01-23 LAB — CBC
HCT: 30.7 % — ABNORMAL LOW (ref 36.0–46.0)
Hemoglobin: 10 g/dL — ABNORMAL LOW (ref 12.0–15.0)
MCH: 31.3 pg (ref 26.0–34.0)
MCHC: 32.6 g/dL (ref 30.0–36.0)
MCV: 95.9 fL (ref 80.0–100.0)
Platelets: 101 K/uL — ABNORMAL LOW (ref 150–400)
RBC: 3.2 MIL/uL — ABNORMAL LOW (ref 3.87–5.11)
RDW: 20.6 % — ABNORMAL HIGH (ref 11.5–15.5)
WBC: 6 K/uL (ref 4.0–10.5)
nRBC: 0 % (ref 0.0–0.2)

## 2024-01-23 LAB — COOXEMETRY PANEL
Carboxyhemoglobin: 2.5 % — ABNORMAL HIGH (ref 0.5–1.5)
Carboxyhemoglobin: 2.5 % — ABNORMAL HIGH (ref 0.5–1.5)
Methemoglobin: 0.8 % (ref 0.0–1.5)
Methemoglobin: <0.7 % (ref 0.0–1.5)
O2 Saturation: 58 %
O2 Saturation: 55.4 %
Total hemoglobin: 10.3 g/dL — ABNORMAL LOW (ref 12.0–16.0)
Total hemoglobin: 10.3 g/dL — ABNORMAL LOW (ref 12.0–16.0)

## 2024-01-23 LAB — MAGNESIUM: Magnesium: 1.7 mg/dL (ref 1.7–2.4)

## 2024-01-23 LAB — GLUCOSE, CAPILLARY
Glucose-Capillary: 120 mg/dL — ABNORMAL HIGH (ref 70–99)
Glucose-Capillary: 141 mg/dL — ABNORMAL HIGH (ref 70–99)
Glucose-Capillary: 127 mg/dL — ABNORMAL HIGH (ref 70–99)
Glucose-Capillary: 139 mg/dL — ABNORMAL HIGH (ref 70–99)

## 2024-01-23 MED ORDER — TORSEMIDE 20 MG PO TABS
60.0000 mg | ORAL_TABLET | Freq: Every day | ORAL | Status: DC
  Administered 2024-01-23 – 2024-01-25 (×3): 60 mg via ORAL
  Filled 2024-01-23 (×4): qty 3

## 2024-01-23 MED ORDER — POTASSIUM CHLORIDE CRYS ER 20 MEQ PO TBCR: 20.0000 meq | EXTENDED_RELEASE_TABLET | Freq: Once | ORAL | Status: DC

## 2024-01-23 MED ORDER — POTASSIUM CHLORIDE 10 MEQ/50ML IV SOLN
10.0000 meq | INTRAVENOUS | Status: AC
  Administered 2024-01-23 (×2): 10 meq via INTRAVENOUS
  Filled 2024-01-23 (×2): qty 50

## 2024-01-23 MED ORDER — MAGNESIUM SULFATE 2 GM/50ML IV SOLN
2.0000 g | Freq: Once | INTRAVENOUS | Status: AC
  Administered 2024-01-23: 2 g via INTRAVENOUS
  Filled 2024-01-23: qty 50

## 2024-01-23 MED ORDER — HYDRALAZINE HCL 10 MG PO TABS
10.0000 mg | ORAL_TABLET | Freq: Three times a day (TID) | ORAL | Status: DC
  Administered 2024-01-23 – 2024-01-24 (×6): 10 mg via ORAL
  Filled 2024-01-23 (×6): qty 1

## 2024-01-23 MED ORDER — SERTRALINE HCL 50 MG PO TABS
25.0000 mg | ORAL_TABLET | Freq: Every day | ORAL | Status: DC
  Administered 2024-01-23 – 2024-01-25 (×3): 25 mg via ORAL
  Filled 2024-01-23 (×3): qty 1

## 2024-01-23 NOTE — Progress Notes (Signed)
 Occupational Therapy Treatment Patient Details Name: Tracy Baker MRN: [id number] DOB: 1955/12/29 Today's Date: 01/23/2024   History of present illness 68 y.o. female presents to [facility] hospital on 12/29/2023 with after leaving AMA from [facility] where she was being treated for C. Albicans candidemia. TEE on 10/28 positive for vegetation of mitral valve. Extraction of multiple teeth on 11/3. Mitral valve repair on 11/6. PMH includes thyroid  disease.   OT comments  Pt progressing well towards goals. Progressed to tolerate ~8 minutes of activity with x3 seated rest breaks. Progressed to complete LB dressing with supervision for cues to adhere to sternal precautions. Pt continues to be limited by decreased strength, balance, and activity tolerance. Noted pt replaced on 3L nasal cannula this AM, so session conducted on 3L, hemodynamics stable. Continue to recommend >3 hours of skilled rehab daily to optimize independence levels. Will continue to follow acutely.        If plan is discharge home, recommend the following:  A lot of help with bathing/dressing/bathroom;A lot of help with walking and/or transfers;Assist for transportation;Assistance with cooking/housework   Equipment Recommendations  Tub/shower seat       Precautions / Restrictions Precautions Precautions: Fall;Sternal Precaution Booklet Issued: Yes (comment) Recall of Precautions/Restrictions: Intact Precaution/Restrictions Comments: Reviewed precautions. Restrictions Weight Bearing Restrictions Per Provider Order: No Other Position/Activity Restrictions: Cardiac sternal precautions       Mobility Bed Mobility Overal bed mobility: Needs Assistance     General bed mobility comments: Received in recliner    Transfers Overall transfer level: Needs assistance Equipment used: Rolling walker (2 wheels) Transfers: Sit to/from Stand Sit to Stand: Contact guard assist           General transfer comment: CGA for safety, as  fatigue sets in pt's balance decreases     Balance Overall balance assessment: Needs assistance Sitting-balance support: No upper extremity supported, Feet supported Sitting balance-Leahy Scale: Good     Standing balance support: Bilateral upper extremity supported, During functional activity, Reliant on assistive device for balance Standing balance-Leahy Scale: Poor Standing balance comment: Reliant on RW       ADL either performed or assessed with clinical judgement   ADL Overall ADL's : Needs assistance/impaired Eating/Feeding: Set up;Sitting       Lower Body Dressing: Supervision/safety;Sit to/from stand;Cueing for compensatory techniques Lower Body Dressing Details (indicate cue type and reason): x3 trials, intermittent cues for technique             Functional mobility during ADLs: Contact guard assist;Rolling walker (2 wheels) General ADL Comments: Session focused on improving activity tolerance    Extremity/Trunk Assessment Upper Extremity Assessment Upper Extremity Assessment: Generalized weakness   Lower Extremity Assessment Lower Extremity Assessment: Defer to PT evaluation        Vision   Vision Assessment?: No apparent visual deficits         Communication Communication Communication: No apparent difficulties Factors Affecting Communication: Reduced clarity of speech   Cognition Arousal: Alert Behavior During Therapy: WFL for tasks assessed/performed Cognition: No apparent impairments       Following commands: Intact Following commands impaired: Only follows one step commands consistently      Cueing   Cueing Techniques: Verbal cues, Gestural cues        General Comments hemodynamics stable on 3L    Pertinent Vitals/ Pain       Pain Assessment Pain Assessment: Faces Faces Pain Scale: Hurts a little bit Pain Location: sternum Pain Descriptors / Indicators: Discomfort Pain Intervention(s): Monitored  during session   Frequency   Min 2X/week        Progress Toward Goals  OT Goals(current goals can now be found in the care plan section)  Progress towards OT goals: Progressing toward goals  Acute Rehab OT Goals Patient Stated Goal: To get better OT Goal Formulation: With patient Time For Goal Achievement: 01/29/24 Potential to Achieve Goals: Good ADL Goals Pt Will Perform Grooming: with supervision;standing Pt Will Perform Upper Body Bathing: with supervision;sitting Pt Will Perform Lower Body Bathing: with min assist;sit to/from stand Pt Will Perform Upper Body Dressing: with supervision;sitting Pt Will Perform Lower Body Dressing: with min assist;sit to/from stand Pt Will Transfer to Toilet: with supervision;ambulating;regular height toilet  Plan         AM-PAC OT 6 Clicks Daily Activity     Outcome Measure   Help from another person eating meals?: None Help from another person taking care of personal grooming?: None Help from another person toileting, which includes using toliet, bedpan, or urinal?: A Little Help from another person bathing (including washing, rinsing, drying)?: A Little Help from another person to put on and taking off regular upper body clothing?: A Little Help from another person to put on and taking off regular lower body clothing?: A Little 6 Click Score: 20    End of Session Equipment Utilized During Treatment: Rolling walker (2 wheels)  OT Visit Diagnosis: Unsteadiness on feet (R26.81);Muscle weakness (generalized) (M62.81)   Activity Tolerance Patient tolerated treatment well   Patient Left in chair;with call bell/phone within reach   Nurse Communication Mobility status        Time: 8855-8782 OT Time Calculation (min): 33 min  Charges: OT General Charges $OT Visit: 1 Visit OT Treatments $Self Care/Home Management : 23-37 mins  Adrianne BROCKS, OT  Acute Rehabilitation Services Office [phone] Secure chat preferred   Adrianne GORMAN Savers 01/23/2024, 1:18  PM

## 2024-01-23 NOTE — Progress Notes (Signed)
 Physical Therapy Treatment Patient Details Name: Tracy Baker MRN: [id number] DOB: December 12, 1955 Today's Date: 01/23/2024   History of Present Illness 68 y.o. female presents to [facility] hospital on 12/29/2023 with after leaving AMA from [facility] where she was being treated for C. Albicans candidemia. TEE on 10/28 positive for vegetation of mitral valve. Extraction of multiple teeth on 11/3. Mitral valve repair on 11/6. PMH includes thyroid  disease.    PT Comments  Currently pt is presenting at [facility] for bed mobility, Min A for sit to stand with frequent cues to maintain sternal precautions and CGA/Min A for gait with RW due to poor awareness. Pt has significant difficulty remembering sternal precautions as demonstrated by pt went to restroom and was reminded of sternal precautions then reports she wiped by reaching behind her back when this physical therapist returned to rest room. Pt has good family support. Very active and ind prior to hospitalization. Due to pt current functional status, home set up and available assistance at home recommending skilled physical therapy services > 3 hours/day in order to address strength, balance and functional mobility to decrease risk for falls, injury, immobility, skin break down and re-hospitalization.      If plan is discharge home, recommend the following: Assistance with cooking/housework;Assist for transportation;Help with stairs or ramp for entrance;A little help with walking and/or transfers;A little help with bathing/dressing/bathroom     Equipment Recommendations  Rolling walker (2 wheels);BSC/3in1       Precautions / Restrictions Precautions Precautions: Fall;Sternal Recall of Precautions/Restrictions: Intact Precaution/Restrictions Comments: Reviewed precautions. Restrictions Weight Bearing Restrictions Per Provider Order: No RUE Weight Bearing Per Provider Order: Non weight bearing RUE Partial Weight Bearing Percentage or Pounds: 5 LUE Weight Bearing  Per Provider Order: Non weight bearing LUE Partial Weight Bearing Percentage or Pounds: 5 Other Position/Activity Restrictions: Cardiac sternal precautions     Mobility  Bed Mobility Overal bed mobility: Needs Assistance           Sit to sidelying: Contact guard assist General bed mobility comments: CGA for LE; good technique.    Transfers Overall transfer level: Needs assistance Equipment used: Rolling walker (2 wheels) Transfers: Sit to/from Stand Sit to Stand: Min assist   Step pivot transfers: Min assist, Contact guard assist       General transfer comment: Min A for sit to stand from recliner and toilet with verbal cues to maintain sternal precautions. Pt was CGA with occasional need for Min A to navigate RW for step pivot transfers .    Ambulation/Gait Ambulation/Gait assistance: Min assist, Contact guard assist Gait Distance (Feet): 40 Feet Assistive device: Rolling walker (2 wheels) Gait Pattern/deviations: Step-through pattern, Decreased stride length, Drifts right/left, Scissoring Gait velocity: decreased Gait velocity interpretation: <1.31 ft/sec, indicative of household ambulator   General Gait Details: Intermitent min assist for RW control and instability. No overt buckling does demonstrates drifting at times. Cues for sequencing and awareness.     Balance Overall balance assessment: Needs assistance Sitting-balance support: No upper extremity supported, Feet supported Sitting balance-Leahy Scale: Good     Standing balance support: Bilateral upper extremity supported, During functional activity, Reliant on assistive device for balance Standing balance-Leahy Scale: Poor Standing balance comment: Reliant on RW      Communication Communication Communication: No apparent difficulties Factors Affecting Communication: Reduced clarity of speech  Cognition Arousal: Alert Behavior During Therapy: Flat affect, WFL for tasks assessed/performed   PT -  Cognitive impairments: Problem solving, Safety/Judgement       Following commands:  Intact Following commands impaired: Only follows one step commands consistently    Cueing Cueing Techniques: Verbal cues, Gestural cues     General Comments General comments (skin integrity, edema, etc.): vital signs stable on 2 L O2 via [place].      Pertinent Vitals/Pain Pain Assessment Pain Assessment: Faces Faces Pain Scale: Hurts a little bit Facial Expression: Relaxed, neutral Body Movements: Absence of movements Muscle Tension: Relaxed Compliance with ventilator (intubated pts.): N/A Vocalization (extubated pts.): Talking in normal tone or no sound CPOT Total: 0 Pain Location: sternum Pain Descriptors / Indicators: Discomfort Pain Intervention(s): Monitored during session     PT Goals (current goals can now be found in the care plan section) Acute Rehab PT Goals Patient Stated Goal: to return to work PT Goal Formulation: With patient Time For Goal Achievement: 01/27/24 Potential to Achieve Goals: Fair Progress towards PT goals: Progressing toward goals    Frequency    (P) Min 2X/week      PT Plan  Continue with current POC        AM-PAC PT 6 Clicks Mobility   Outcome Measure  Help needed turning from your back to your side while in a flat bed without using bedrails?: A Little Help needed moving from lying on your back to sitting on the side of a flat bed without using bedrails?: A Little Help needed moving to and from a bed to a chair (including a wheelchair)?: A Little Help needed standing up from a chair using your arms (e.g., wheelchair or bedside chair)?: A Little Help needed to walk in hospital room?: A Little Help needed climbing 3-5 steps with a railing? : A Lot 6 Click Score: 17    End of Session Equipment Utilized During Treatment: Oxygen Activity Tolerance: Patient tolerated treatment well Patient left: with call bell/phone within reach;in bed;with bed alarm  set Nurse Communication: Mobility status PT Visit Diagnosis: Other abnormalities of gait and mobility (R26.89);Muscle weakness (generalized) (M62.81);Difficulty in walking, not elsewhere classified (R26.2);Pain Pain - part of body:  (sternum)     Time: 8661-8598 PT Time Calculation (min) (ACUTE ONLY): 23 min  Charges:    $Therapeutic Activity: 23-37 mins PT General Charges $$ ACUTE PT VISIT: 1 Visit                    Dorothyann Maier, DPT, CLT  Acute Rehabilitation Services Office: [phone] (Secure chat preferred)    Dorothyann VEAR Maier 01/23/2024, 2:37 PM

## 2024-01-23 NOTE — Progress Notes (Signed)
 Patient ID: Tracy Baker, female   DOB: 07-30-1955, 68 y.o.   MRN: [id number]     Advanced Heart Failure Rounding Note  Cardiologist: Lonni Cash, MD  Chief Complaint: S/p MV Repair / MV endocarditis   Patient Profile   Tracy Baker is a 68 y.o. female with hx of recent nephrolithiasis s/p ureteral stent and lithotripsy at [facility] 10/10 with stent removal. 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 Post-op course c/b post cardiotomy shock, junctional bradycardia, afib w/ RVR and HAP.   Subjective:    POD #13 s/p MV repair for candidal MV endocarditis.   Co-ox 58% on 0.125 milrinone, CI 2.6.   CVP 5.  Weight down 1 lb.   Finished empiric cefepime + linezolid empirically for aspiration PNA on 11/18.   TEE (11/18) showed EF 60-65%, mild RV dysfunction, severe MR.  Cardioversion not done due to severity of MR (and still on milrinone).   Walked this morning.   Objective:   Weight Range: 66.6 kg Body mass index is 25.2 kg/m.   Vital Signs:   Temp:  [97.7 F (36.5 C)-98.1 F (36.7 C)] 97.7 F (36.5 C) (11/19 0351) Pulse Rate:  [59-78] 70 (11/19 0600) Resp:  [10-24] 24 (11/19 0600) BP: (88-124)/(54-80) 88/69 (11/19 0600) SpO2:  [88 %-98 %] 96 % (11/19 0600) Weight:  [66.6 kg] 66.6 kg (11/19 0500) Last BM Date : 01/22/24  Weight change: Filed Weights   01/21/24 0500 01/22/24 0500 01/23/24 0500  Weight: 68.7 kg 66.9 kg 66.6 kg    Intake/Output:   Intake/Output Summary (Last 24 hours) at 01/23/2024 0743 Last data filed at 01/23/2024 0600 Gross per 24 hour  Intake 1372.55 ml  Output 2005 ml  Net -632.45 ml    Physical Exam   Vitals:   01/23/24 0500 01/23/24 0600  BP:  (!) 88/69  Pulse: 72 70  Resp: 13 (!) 24  Temp:    SpO2: 92% 96%   General: NAD Neck: No JVD, no thyromegaly or thyroid  nodule.  Lungs: Clear to auscultation bilaterally with normal respiratory effort. CV: Nondisplaced PMI.  Heart irregular S1/S2, no  S3/S4, 2/6 HSM apex.  No peripheral edema.   Abdomen: Soft, nontender, no hepatosplenomegaly, no distention.  Skin: Intact without lesions or rashes.  Neurologic: Alert and oriented x 3.  Psych: Normal affect. Extremities: No clubbing or cyanosis.  HEENT: Normal.   Telemetry   ?Atrial flutter rate 70s (personally reviewed)  Labs    CBC Recent Labs    01/22/24 1000 01/23/24 0507  WBC 8.2 6.0  HGB 10.7* 10.0*  HCT 32.7* 30.7*  MCV 94.8 95.9  PLT 123* 101*   Basic Metabolic Panel Recent Labs    88/81/74 0447 01/23/24 0507  NA 131* 134*  K 3.5 3.9  CL 88* 93*  CO2 28 28  GLUCOSE 173* 169*  BUN 19 22  CREATININE 1.50* 1.60*  CALCIUM 8.6* 8.4*  MG 1.7 1.7   Liver Function Tests Recent Labs    01/21/24 0419 01/22/24 0447  AST 54* 47*  ALT 98* 77*  ALKPHOS 142* 137*  BILITOT 2.1* 2.0*  PROT 5.9* 5.9*  ALBUMIN 2.6* 2.8*   Medications:   Scheduled Medications:  apixaban  5 mg Oral BID   Chlorhexidine Gluconate Cloth  6 each Topical Daily   digoxin  0.125 mg Oral Daily   docusate sodium  200 mg Oral Daily   feeding supplement  237 mL Oral BID BM   insulin aspart  0-15 Units Subcutaneous TID WC   insulin  aspart  0-5 Units Subcutaneous QHS   oxymetazoline   1 spray Each Nare BID   pantoprazole  (PROTONIX ) IV  40 mg Intravenous Daily   scopolamine   2 patch Transdermal Q72H   sodium chloride  flush  10-40 mL Intracatheter Q12H   spironolactone   25 mg Oral Daily    Infusions:  amiodarone  30 mg/hr (01/23/24 0600)   fluconazole  (DIFLUCAN ) IV Stopped (01/22/24 1746)   milrinone  0.125 mcg/kg/min (01/23/24 0600)    PRN Medications: bisacodyl  **OR** [DISCONTINUED] bisacodyl , clonazePAM , fentaNYL  (SUBLIMAZE ) injection, hydrocortisone  cream, HYDROmorphone , ondansetron  (ZOFRAN ) IV, mouth rinse, sodium chloride , sodium chloride  flush   Assessment/Plan   1. MV Endocarditis d/t Candida Albicans candidemia/fungemia, S/p MV repair w/ Post-cardiotomy Shock  - s/p MV  repair 01/10/24 by Dr. Kerrin for Candida endocarditis.  - Pre-op L/RHC 01/04/24 with no CAD and normal L/R heart pressures.  - Intra-op TEE LVEF 60-65%, RV mildly reduced, small immobile mass on anterior valve leaflet, mod MR, trivial TR - Failed initial Milrinone  wean. Echo 11/13 per Dr. Rolan review: EF 60-65%, moderate RV dysfunction, s/p MV repair with probably moderate mitral regurgitation.  Milrinone  restarted.  - Difficulty weaning milrinone .  TEE done 11/18 showing LV EF 60-65%, mild RV dysfunction, severe mitral regurgitation of repaired valve.   - Co-ox 58% this am on 0.125 milrinone , this correlates with CI 2.6. Will try stopping milrinone  today and then get repeat co-ox.  - Continue digoxin .  - Add low dose hydralazine  10 tid for afterload reduction.  - CVP 5. With severe MR, will resume torsemide  at 60 mg daily.  - Continue spiro 25 mg daily - Discussed TEE with structural heart service (Dr. Wonda), does not appear to be a good mTEER candidate.  - Will talk with Dr. Kerrin about further steps for severe MR. Would be high risk for redo surgery this close to prior operation.   2. Transient Junctional Nola  - Now in atrial fibrillation, on amiodarone  30 mg/hr.    3. Atrial fibrillation - Regular rhythm this morning, ?AFL vs maybe NSR.  Needs ECG.  - Continue amiodarone  30 mg/hr.   - Continue apixaban .  - I have not cardioverted yet due to severe MR and ongoing milrinone .  I worry that she would not stay out of AF if we cardioverted.  Will consider if no mitral valve re-intervention and we can get her off milrinone .   4. ?HAP/aspiration  - covering w/ cefepime  + linezolid , completed 11/19.  - afebrile  5. 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   7. Elevated LFTs - Marked jump in LFTs 11/8. Now improving.  8. Epistaxis - Recurrent. Now off aspirin , remains on eliquis . - Seen  by ENT  Continue aggressive PT/OT. Possible CIR once ready for discharge.  CRITICAL CARE Performed by: Ezra Rolan  Total critical care time: 40 minutes  Critical care time was exclusive of separately billable procedures and treating other patients.  Critical care was necessary to treat or prevent imminent or life-threatening deterioration.  Critical care was time spent personally by me on the following activities: development of treatment plan with patient and/or surrogate as well as nursing, discussions with consultants, evaluation of patient's response to treatment, examination of patient, obtaining history from patient or surrogate, ordering and performing treatments and interventions, ordering and review of laboratory studies, ordering and review of radiographic studies, pulse oximetry and re-evaluation of patient's condition.  Ezra Shuck 01/23/2024 7:43 AM

## 2024-01-23 NOTE — Progress Notes (Signed)
 1 Day Post-Op Procedure(s) (LRB): TRANSESOPHAGEAL ECHOCARDIOGRAM (N/A) CARDIOVERSION (N/A) Subjective: Denies shortness of breath  Objective: Vital signs in last 24 hours: Temp:  [97.5 F (36.4 C)-98 F (36.7 C)] 97.5 F (36.4 C) (11/19 1706) Pulse Rate:  [53-88] 53 (11/19 1700) Cardiac Rhythm: Atrial fibrillation (11/19 0800) Resp:  [8-24] 21 (11/19 1700) BP: (88-135)/(55-80) 107/65 (11/19 1700) SpO2:  [92 %-100 %] 98 % (11/19 1700) Weight:  [66.6 kg] 66.6 kg (11/19 0500)  Hemodynamic parameters for last 24 hours: CVP:  [1 mmHg-13 mmHg] 8 mmHg  Intake/Output from previous day: 11/18 0701 - 11/19 0700 In: 1372.6 [P.O.:250; I.V.:772.5; IV Piggyback:350] Out: 2005 [Urine:2005] Intake/Output this shift: Total I/O In: 457.1 [I.V.:121.7; IV Piggyback:335.5] Out: 240 [Urine:240]  General appearance: alert, cooperative, and no distress Neurologic: intact Heart: irregularly irregular rhythm Lungs: diminished breath sounds bilaterally Wound: clean and dry  Lab Results: Recent Labs    01/22/24 1000 01/23/24 0507  WBC 8.2 6.0  HGB 10.7* 10.0*  HCT 32.7* 30.7*  PLT 123* 101*   BMET:  Recent Labs    01/22/24 0447 01/23/24 0507  NA 131* 134*  K 3.5 3.9  CL 88* 93*  CO2 28 28  GLUCOSE 173* 169*  BUN 19 22  CREATININE 1.50* 1.60*  CALCIUM 8.6* 8.4*    PT/INR: No results for input(s): LABPROT, INR in the last 72 hours. ABG    Component Value Date/Time   PHART 7.387 01/11/2024 0856   HCO3 19.3 (L) 01/11/2024 0856   TCO2 20 (L) 01/11/2024 0856   ACIDBASEDEF 5.0 (H) 01/11/2024 0856   O2SAT 55.4 01/23/2024 1144   CBG (last 3)  Recent Labs    01/23/24 0633 01/23/24 1126 01/23/24 1703  GLUCAP 120* 141* 127*    Assessment/Plan: S/P Procedure(s) (LRB): TRANSESOPHAGEAL ECHOCARDIOGRAM (N/A) CARDIOVERSION (N/A) POD # 13 ' Reviewed TEE from yesterday, now has severe MR, dramatically different from intra-op TEE.  Suspect a torn suture or neochord  She is  tolerating well at this point with no dyspnea, but remains in atrial fib.  I had discussions with Mrs. Patton, her son and her daughter [name]  She will need an MVR.  The only question is timing.  Hopefully can find a balance with medical therapy and avoid decompensation.  If so, I think she would benefit from time to recover from current illness before undergoing another procedure.  Might be difficult to find that balance and if she fails to progress will need to proceed more urgently.  All signs indicate fungal infection is under control  Co-ox this afternoon 55 of milrinone   LOS: 21 days    [name] 01/23/2024

## 2024-01-23 NOTE — Telephone Encounter (Signed)
 Pharmacy Patient Advocate Encounter  Insurance verification completed.    The patient is insured through [facility] [employer]. Patient has Medicare and is not eligible for a copay card, but may be able to apply for patient assistance or Medicare RX Payment Plan (Patient Must reach out to their plan, if eligible for payment plan), if available.    Ran test claim for Eliquis 5mg  and the current 30 day co-pay is $453.64- will be $47 once deductible is met.   This test claim was processed through [facility]- copay amounts may vary at other pharmacies due to pharmacy/plan contracts, or as the patient moves through the different stages of their insurance plan.

## 2024-01-24 DIAGNOSIS — R570 Cardiogenic shock: Secondary | ICD-10-CM | POA: Diagnosis not present

## 2024-01-24 DIAGNOSIS — B377 Candidal sepsis: Secondary | ICD-10-CM | POA: Diagnosis not present

## 2024-01-24 DIAGNOSIS — Z9889 Other specified postprocedural states: Secondary | ICD-10-CM

## 2024-01-24 LAB — CBC
HCT: 30.3 % — ABNORMAL LOW (ref 36.0–46.0)
Hemoglobin: 10 g/dL — ABNORMAL LOW (ref 12.0–15.0)
MCH: 31.4 pg (ref 26.0–34.0)
MCHC: 33 g/dL (ref 30.0–36.0)
MCV: 95.3 fL (ref 80.0–100.0)
Platelets: 81 K/uL — ABNORMAL LOW (ref 150–400)
RBC: 3.18 MIL/uL — ABNORMAL LOW (ref 3.87–5.11)
RDW: 19.9 % — ABNORMAL HIGH (ref 11.5–15.5)
WBC: 8 K/uL (ref 4.0–10.5)
nRBC: 0 % (ref 0.0–0.2)

## 2024-01-24 LAB — GLUCOSE, CAPILLARY
Glucose-Capillary: 140 mg/dL — ABNORMAL HIGH (ref 70–99)
Glucose-Capillary: 126 mg/dL — ABNORMAL HIGH (ref 70–99)
Glucose-Capillary: 121 mg/dL — ABNORMAL HIGH (ref 70–99)
Glucose-Capillary: 114 mg/dL — ABNORMAL HIGH (ref 70–99)

## 2024-01-24 LAB — COOXEMETRY PANEL
Carboxyhemoglobin: 1.5 % (ref 0.5–1.5)
Methemoglobin: <0.7 % (ref 0.0–1.5)
O2 Saturation: 56 %
Total hemoglobin: 9.7 g/dL — ABNORMAL LOW (ref 12.0–16.0)

## 2024-01-24 LAB — BASIC METABOLIC PANEL WITH GFR
Anion gap: 13 (ref 5–15)
BUN: 25 mg/dL — ABNORMAL HIGH (ref 8–23)
CO2: 31 mmol/L (ref 22–32)
Calcium: 8.2 mg/dL — ABNORMAL LOW (ref 8.9–10.3)
Chloride: 86 mmol/L — ABNORMAL LOW (ref 98–111)
Creatinine, Ser: 1.36 mg/dL — ABNORMAL HIGH (ref 0.44–1.00)
GFR, Estimated: 42 mL/min — ABNORMAL LOW (ref >60)
Glucose, Bld: 239 mg/dL — ABNORMAL HIGH (ref 70–99)
Potassium: 3.6 mmol/L (ref 3.5–5.1)
Sodium: 130 mmol/L — ABNORMAL LOW (ref 135–145)

## 2024-01-24 LAB — DIGOXIN LEVEL: Digoxin Level: 1.1 ng/mL (ref 0.8–2.0)

## 2024-01-24 LAB — MAGNESIUM: Magnesium: 1.9 mg/dL (ref 1.7–2.4)

## 2024-01-24 MED ORDER — MAGNESIUM SULFATE 2 GM/50ML IV SOLN
2.0000 g | Freq: Once | INTRAVENOUS | Status: AC
  Administered 2024-01-24: 2 g via INTRAVENOUS
  Filled 2024-01-24: qty 50

## 2024-01-24 MED ORDER — POTASSIUM CHLORIDE 10 MEQ/50ML IV SOLN
10.0000 meq | INTRAVENOUS | Status: AC
  Administered 2024-01-24 (×4): 10 meq via INTRAVENOUS
  Filled 2024-01-24 (×4): qty 50

## 2024-01-24 MED ORDER — AMIODARONE HCL 200 MG PO TABS: 400.0000 mg | ORAL_TABLET | Freq: Two times a day (BID) | ORAL | Status: DC

## 2024-01-24 MED ORDER — AMIODARONE HCL 100 MG PO TABS
200.0000 mg | ORAL_TABLET | Freq: Two times a day (BID) | ORAL | Status: DC
  Administered 2024-01-24 – 2024-01-25 (×3): 200 mg via ORAL
  Filled 2024-01-24 (×4): qty 2

## 2024-01-24 MED ORDER — DIGOXIN 125 MCG PO TABS
0.0625 mg | ORAL_TABLET | Freq: Every day | ORAL | Status: DC
  Administered 2024-01-26 – 2024-01-29 (×4): 0.0625 mg via ORAL
  Filled 2024-01-24 (×5): qty 1

## 2024-01-24 NOTE — Progress Notes (Signed)
 Patient ID: Tracy Baker, female   DOB: [birth year], 68 y.o.   MRN: [id number]     Advanced Heart Failure Rounding Note  Cardiologist: Lonni Cash, MD  Chief Complaint: S/p MV Repair / MV endocarditis   Patient Profile   Tracy Baker is a 68 y.o. female with hx of recent nephrolithiasis s/p ureteral stent and lithotripsy at [facility] 10/10 with stent removal. 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 Post-op course c/b post cardiotomy shock, junctional bradycardia, afib w/ RVR and HAP.   Subjective:    POD #14 s/p MV repair for candidal MV endocarditis.   Co-ox 56% off milrinone.   CVP 7.  Weight down 1 lb.   Finished empiric cefepime + linezolid empirically for aspiration PNA on 11/18.   She remains in AF on amiodarone gtt and apixaban.   TEE (11/18) showed EF 60-65%, mild RV dysfunction, severe MR.  Cardioversion not done due to severity of MR (and still on milrinone).   No dyspnea, walked yesterday.   Objective:   Weight Range: 66.2 kg Body mass index is 25.05 kg/m.   Vital Signs:   Temp:  [96.7 F (35.9 C)-97.6 F (36.4 C)] 96.7 F (35.9 C) (11/20 0700) Pulse Rate:  [53-72] 53 (11/20 0800) Resp:  [8-21] 10 (11/20 0800) BP: (69-135)/(39-78) 100/58 (11/20 0800) SpO2:  [93 %-100 %] 95 % (11/20 0800) Weight:  [66.2 kg] 66.2 kg (11/20 0500) Last BM Date : 01/22/24  Weight change: Filed Weights   01/22/24 0500 01/23/24 0500 01/24/24 0500  Weight: 66.9 kg 66.6 kg 66.2 kg    Intake/Output:   Intake/Output Summary (Last 24 hours) at 01/24/2024 0908 Last data filed at 01/24/2024 0800 Gross per 24 hour  Intake 670.86 ml  Output 1755 ml  Net -1084.14 ml    Physical Exam   Vitals:   01/24/24 0700 01/24/24 0800  BP: 112/61 (!) 100/58  Pulse: 72 (!) 53  Resp: 15 10  Temp: (!) 96.7 F (35.9 C)   SpO2: 95% 95%   General: NAD Neck: No JVD, no thyromegaly or thyroid  nodule.  Lungs: Clear to auscultation bilaterally with  normal respiratory effort. CV: Nondisplaced PMI.  Heart regular S1/S2, no S3/S4, 1/6 HSM apex.  No peripheral edema.   Abdomen: Soft, nontender, no hepatosplenomegaly, no distention.  Skin: Intact without lesions or rashes.  Neurologic: Alert and oriented x 3.  Psych: Normal affect. Extremities: No clubbing or cyanosis.  HEENT: Normal.   Telemetry   Atrial fibrillation rate 70s (personally reviewed)  Labs    CBC Recent Labs    01/23/24 0507 01/24/24 0624  WBC 6.0 8.0  HGB 10.0* 10.0*  HCT 30.7* 30.3*  MCV 95.9 95.3  PLT 101* 81*   Basic Metabolic Panel Recent Labs    88/80/74 0507 01/23/24 1656 01/23/24 1933 01/24/24 0624  NA 134*   < > 134* 130*  K 3.9   < > 4.0 3.6  CL 93*   < > 89* 86*  CO2 28   < > 29 31  GLUCOSE 169*   < > 166* 239*  BUN 22   < > 27* 25*  CREATININE 1.60*   < > 1.74* 1.36*  CALCIUM 8.4*   < > 8.8* 8.2*  MG 1.7  --   --  1.9   < > = values in this interval not displayed.   Liver Function Tests Recent Labs    01/22/24 0447  AST 47*  ALT 77*  ALKPHOS  137*  BILITOT 2.0*  PROT 5.9*  ALBUMIN 2.8*   Medications:   Scheduled Medications:  amiodarone  200 mg Oral BID   apixaban  5 mg Oral BID   Chlorhexidine Gluconate Cloth  6 each Topical Daily   [START ON 01/26/2024] digoxin  0.0625 mg Oral Daily   docusate sodium  200 mg Oral Daily   feeding supplement  237 mL Oral BID BM   hydrALAZINE  10 mg Oral TID   insulin aspart  0-15 Units Subcutaneous TID WC   insulin aspart  0-5 Units Subcutaneous QHS   oxymetazoline  1 spray Each Nare BID   pantoprazole  (PROTONIX ) IV  40 mg Intravenous Daily   scopolamine  2 patch Transdermal Q72H   sertraline  25 mg Oral Daily   sodium chloride  flush  10-40 mL Intracatheter Q12H   spironolactone  25 mg Oral Daily   torsemide  60 mg Oral Daily    Infusions:  amiodarone 30 mg/hr (01/24/24 0800)   fluconazole (DIFLUCAN) IV 400 mg (01/24/24 0846)    PRN Medications: bisacodyl **OR** [DISCONTINUED]  bisacodyl, clonazePAM , fentaNYL  (SUBLIMAZE ) injection, hydrocortisone cream, HYDROmorphone, ondansetron  (ZOFRAN ) IV, mouth rinse, sodium chloride , sodium chloride  flush   Assessment/Plan   1. MV Endocarditis d/t Candida Albicans candidemia/fungemia, S/p MV repair w/ Post-cardiotomy Shock  - s/p MV repair 01/10/24 by Dr. Kerrin for Candida endocarditis.  - Pre-op L/RHC 01/04/24 with no CAD and normal L/R heart pressures.  - Intra-op TEE LVEF 60-65%, RV mildly reduced, small immobile mass on anterior valve leaflet, mod MR, trivial TR - Failed initial Milrinone wean. Echo 11/13 per Dr. Rolan review: EF 60-65%, moderate RV dysfunction, s/p MV repair with probably moderate mitral regurgitation.  Milrinone restarted.  - Difficulty weaning milrinone.  TEE done 11/18 showing LV EF 60-65%, mild RV dysfunction, severe mitral regurgitation of repaired valve.   - Co-ox 56% this am off milrinone.  - Continue digoxin but with level 1.1, need to decrease dose to 0.0625.  - Continue low dose hydralazine 10 tid for afterload reduction, not able to increase due to low BP.  - CVP 7. With severe MR, have resumed torsemide at 60 mg daily.  - Continue spiro 25 mg daily - Discussed TEE with structural heart service (Dr. Wonda), does not appear to be a good mTEER candidate.  - Discussed with Dr. Kerrin, she will need to go back for MV replacement with severe MR but timing will need to be determined.  Would like to see further recovery from initial operation.   2. Transient Junctional Nola  - Now in atrial fibrillation, on amiodarone 30 mg/hr.  3. Atrial fibrillation - Remains in AF.  - Can transition amiodarone to po.   - Continue apixaban.  - I have not cardioverted yet due to severe MR and ongoing milrinone.  I worry that she would not stay out of AF if we cardioverted and rate is controlled.   4. ?HAP/aspiration  - covering w/ cefepime + linezolid, completed 11/19.  - afebrile  5. 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   7. Elevated LFTs - Marked jump in LFTs 11/8. Now improving.  8. Epistaxis - Recurrent. Now off aspirin, remains on eliquis. - Seen by ENT  Continue aggressive PT/OT.   CRITICAL CARE Performed by: Ezra Rolan  Total critical care time: 35 minutes  Critical care time was exclusive of separately billable procedures and treating other  patients.  Critical care was necessary to treat or prevent imminent or life-threatening deterioration.  Critical care was time spent personally by me on the following activities: development of treatment plan with patient and/or surrogate as well as nursing, discussions with consultants, evaluation of patient's response to treatment, examination of patient, obtaining history from patient or surrogate, ordering and performing treatments and interventions, ordering and review of laboratory studies, ordering and review of radiographic studies, pulse oximetry and re-evaluation of patient's condition.   Ezra Shuck 01/24/2024 9:08 AM

## 2024-01-24 NOTE — Plan of Care (Signed)
 Problem: Education: Goal: Knowledge of General Education information will improve Description: Including pain rating scale, medication(s)/side effects and non-pharmacologic comfort measures Outcome: Progressing   Problem: Health Behavior/Discharge Planning: Goal: Ability to manage health-related needs will improve Outcome: Progressing   Problem: Clinical Measurements: Goal: Ability to maintain clinical measurements within normal limits will improve Outcome: Progressing Goal: Will remain free from infection Outcome: Progressing Goal: Diagnostic test results will improve Outcome: Progressing Goal: Respiratory complications will improve Outcome: Progressing Goal: Cardiovascular complication will be avoided Outcome: Progressing   Problem: Activity: Goal: Risk for activity intolerance will decrease Outcome: Progressing   Problem: Nutrition: Goal: Adequate nutrition will be maintained Outcome: Progressing   Problem: Coping: Goal: Level of anxiety will decrease Outcome: Progressing   Problem: Elimination: Goal: Will not experience complications related to bowel motility Outcome: Progressing Goal: Will not experience complications related to urinary retention Outcome: Progressing   Problem: Pain Managment: Goal: General experience of comfort will improve and/or be controlled Outcome: Progressing   Problem: Safety: Goal: Ability to remain free from injury will improve Outcome: Progressing   Problem: Skin Integrity: Goal: Risk for impaired skin integrity will decrease Outcome: Progressing   Problem: Education: Goal: Understanding of CV disease, CV risk reduction, and recovery process will improve Outcome: Progressing Goal: Individualized Educational Video(s) Outcome: Progressing   Problem: Activity: Goal: Ability to return to baseline activity level will improve Outcome: Progressing   Problem: Cardiovascular: Goal: Ability to achieve and maintain adequate  cardiovascular perfusion will improve Outcome: Progressing Goal: Vascular access site(s) Level 0-1 will be maintained Outcome: Progressing   Problem: Health Behavior/Discharge Planning: Goal: Ability to safely manage health-related needs after discharge will improve Outcome: Progressing   Problem: Education: Goal: Will demonstrate proper wound care and an understanding of methods to prevent future damage Outcome: Progressing Goal: Knowledge of disease or condition will improve Outcome: Progressing Goal: Knowledge of the prescribed therapeutic regimen will improve Outcome: Progressing Goal: Individualized Educational Video(s) Outcome: Progressing   Problem: Activity: Goal: Risk for activity intolerance will decrease Outcome: Progressing   Problem: Cardiac: Goal: Will achieve and/or maintain hemodynamic stability Outcome: Progressing   Problem: Clinical Measurements: Goal: Postoperative complications will be avoided or minimized Outcome: Progressing   Problem: Respiratory: Goal: Respiratory status will improve Outcome: Progressing   Problem: Skin Integrity: Goal: Wound healing without signs and symptoms of infection Outcome: Progressing Goal: Risk for impaired skin integrity will decrease Outcome: Progressing   Problem: Urinary Elimination: Goal: Ability to achieve and maintain adequate renal perfusion and functioning will improve Outcome: Progressing   Problem: Education: Goal: Ability to describe self-care measures that may prevent or decrease complications (Diabetes Survival Skills Education) will improve Outcome: Progressing Goal: Individualized Educational Video(s) Outcome: Progressing   Problem: Coping: Goal: Ability to adjust to condition or change in health will improve Outcome: Progressing   Problem: Fluid Volume: Goal: Ability to maintain a balanced intake and output will improve Outcome: Progressing   Problem: Health Behavior/Discharge  Planning: Goal: Ability to identify and utilize available resources and services will improve Outcome: Progressing Goal: Ability to manage health-related needs will improve Outcome: Progressing   Problem: Metabolic: Goal: Ability to maintain appropriate glucose levels will improve Outcome: Progressing   Problem: Nutritional: Goal: Maintenance of adequate nutrition will improve Outcome: Progressing Goal: Progress toward achieving an optimal weight will improve Outcome: Progressing   Problem: Skin Integrity: Goal: Risk for impaired skin integrity will decrease Outcome: Progressing   Problem: Tissue Perfusion: Goal: Adequacy of tissue perfusion will improve Outcome: Progressing

## 2024-01-24 NOTE — Progress Notes (Signed)
 2 Days Post-Op Procedure(s) (LRB): TRANSESOPHAGEAL ECHOCARDIOGRAM (N/A) CARDIOVERSION (N/A) Subjective: Resting comfortably  Objective: Vital signs in last 24 hours: Temp:  [96.7 F (35.9 C)-97.6 F (36.4 C)] 96.7 F (35.9 C) (11/20 0700) Pulse Rate:  [53-81] 72 (11/20 0700) Cardiac Rhythm: Atrial fibrillation (11/20 0400) Resp:  [8-21] 15 (11/20 0700) BP: (69-135)/(39-78) 112/61 (11/20 0700) SpO2:  [93 %-100 %] 95 % (11/20 0700) Weight:  [66.2 kg] 66.2 kg (11/20 0500)  Hemodynamic parameters for last 24 hours: CVP:  [6 mmHg-13 mmHg] 8 mmHg  Intake/Output from previous day: 11/19 0701 - 11/20 0700 In: 757 [I.V.:421.5; IV Piggyback:335.5] Out: 1755 [Urine:1755] Intake/Output this shift: No intake/output data recorded.  General appearance: alert, cooperative, and no distress Neurologic: intact Heart: irregularly irregular rhythm Lungs: clear to auscultation bilaterally Abdomen: normal findings: soft, non-tender Wound: intact  Lab Results: Recent Labs    01/23/24 0507 01/24/24 0624  WBC 6.0 8.0  HGB 10.0* 10.0*  HCT 30.7* 30.3*  PLT 101* 81*   BMET:  Recent Labs    01/23/24 1933 01/24/24 0624  NA 134* 130*  K 4.0 3.6  CL 89* 86*  CO2 29 31  GLUCOSE 166* 239*  BUN 27* 25*  CREATININE 1.74* 1.36*  CALCIUM 8.8* 8.2*    PT/INR: No results for input(s): LABPROT, INR in the last 72 hours. ABG    Component Value Date/Time   PHART 7.387 01/11/2024 0856   HCO3 19.3 (L) 01/11/2024 0856   TCO2 20 (L) 01/11/2024 0856   ACIDBASEDEF 5.0 (H) 01/11/2024 0856   O2SAT 56 01/24/2024 0624   CBG (last 3)  Recent Labs    01/23/24 1703 01/23/24 2123 01/24/24 0727  GLUCAP 127* 139* 140*    Assessment/Plan: S/P Procedure(s) (LRB): TRANSESOPHAGEAL ECHOCARDIOGRAM (N/A) CARDIOVERSION (N/A) - Remains in rate controlled atrial fib on IV amiodarone- will change to PO, concern for possible nausea but have to try at some point. On Eliquis  Severe MR post MV  repair- well compensated at present Co-ox 56, stable off milrinone On Digoxin, hydralazine started for afterload reduction Creatinine improved at 1.36 1L negative yesterday Diflucan for Candida sepsis Deconditioning- severe, continue PT/OT Thrombocytopenia- PLT down to 81K from 101K- no clear source, monitor   LOS: 22 days    Tracy Baker 01/24/2024

## 2024-01-25 DIAGNOSIS — B377 Candidal sepsis: Secondary | ICD-10-CM | POA: Diagnosis not present

## 2024-01-25 LAB — BASIC METABOLIC PANEL WITH GFR
Anion gap: 13 (ref 5–15)
BUN: 27 mg/dL — ABNORMAL HIGH (ref 8–23)
CO2: 31 mmol/L (ref 22–32)
Calcium: 8.7 mg/dL — ABNORMAL LOW (ref 8.9–10.3)
Chloride: 89 mmol/L — ABNORMAL LOW (ref 98–111)
Creatinine, Ser: 1.5 mg/dL — ABNORMAL HIGH (ref 0.44–1.00)
GFR, Estimated: 38 mL/min — ABNORMAL LOW (ref >60)
Glucose, Bld: 99 mg/dL (ref 70–99)
Potassium: 3.9 mmol/L (ref 3.5–5.1)
Sodium: 133 mmol/L — ABNORMAL LOW (ref 135–145)

## 2024-01-25 LAB — CBC
HCT: 30.1 % — ABNORMAL LOW (ref 36.0–46.0)
Hemoglobin: 9.9 g/dL — ABNORMAL LOW (ref 12.0–15.0)
MCH: 31.3 pg (ref 26.0–34.0)
MCHC: 32.9 g/dL (ref 30.0–36.0)
MCV: 95.3 fL (ref 80.0–100.0)
Platelets: 87 K/uL — ABNORMAL LOW (ref 150–400)
RBC: 3.16 MIL/uL — ABNORMAL LOW (ref 3.87–5.11)
RDW: 20 % — ABNORMAL HIGH (ref 11.5–15.5)
WBC: 7.6 K/uL (ref 4.0–10.5)
nRBC: 0 % (ref 0.0–0.2)

## 2024-01-25 LAB — MAGNESIUM: Magnesium: 2 mg/dL (ref 1.7–2.4)

## 2024-01-25 LAB — COOXEMETRY PANEL
Carboxyhemoglobin: 2.5 % — ABNORMAL HIGH (ref 0.5–1.5)
Methemoglobin: <0.7 % (ref 0.0–1.5)
O2 Saturation: 58.1 %
Total hemoglobin: 10.4 g/dL — ABNORMAL LOW (ref 12.0–16.0)

## 2024-01-25 LAB — GLUCOSE, CAPILLARY
Glucose-Capillary: 122 mg/dL — ABNORMAL HIGH (ref 70–99)
Glucose-Capillary: 117 mg/dL — ABNORMAL HIGH (ref 70–99)
Glucose-Capillary: 113 mg/dL — ABNORMAL HIGH (ref 70–99)
Glucose-Capillary: 117 mg/dL — ABNORMAL HIGH (ref 70–99)

## 2024-01-25 MED ORDER — MENTHOL 3 MG MT LOZG: 1.0000 | LOZENGE | OROMUCOSAL | Status: DC | PRN

## 2024-01-25 MED ORDER — TORSEMIDE 20 MG PO TABS
40.0000 mg | ORAL_TABLET | Freq: Every day | ORAL | Status: DC
  Administered 2024-01-26 – 2024-01-27 (×2): 40 mg via ORAL
  Filled 2024-01-25 (×2): qty 2

## 2024-01-25 MED ORDER — MIRTAZAPINE 15 MG PO TABS
15.0000 mg | ORAL_TABLET | Freq: Every day | ORAL | Status: DC
  Administered 2024-01-25 – 2024-02-07 (×12): 15 mg via ORAL
  Filled 2024-01-25 (×3): qty 2
  Filled 2024-01-25: qty 1
  Filled 2024-01-25 (×2): qty 2
  Filled 2024-01-25 (×2): qty 1
  Filled 2024-01-25 (×5): qty 2

## 2024-01-25 MED ORDER — THIAMINE MONONITRATE 100 MG PO TABS
100.0000 mg | ORAL_TABLET | Freq: Every day | ORAL | Status: DC
  Administered 2024-01-25 – 2024-02-07 (×13): 100 mg via ORAL
  Filled 2024-01-25 (×14): qty 1

## 2024-01-25 MED ORDER — BIOTENE DRY MOUTH MT LIQD: 15.0000 mL | OROMUCOSAL | Status: DC | PRN

## 2024-01-25 MED ORDER — HYDRALAZINE HCL 10 MG PO TABS
20.0000 mg | ORAL_TABLET | Freq: Three times a day (TID) | ORAL | Status: DC
  Administered 2024-01-25 – 2024-02-07 (×36): 20 mg via ORAL
  Filled 2024-01-25 (×39): qty 2

## 2024-01-25 MED ORDER — POTASSIUM CHLORIDE 10 MEQ/50ML IV SOLN
10.0000 meq | INTRAVENOUS | Status: AC
  Administered 2024-01-25 (×3): 10 meq via INTRAVENOUS
  Filled 2024-01-25 (×3): qty 50

## 2024-01-25 MED ORDER — AMIODARONE HCL 200 MG PO TABS
200.0000 mg | ORAL_TABLET | Freq: Two times a day (BID) | ORAL | Status: DC
  Administered 2024-01-25 – 2024-02-03 (×18): 200 mg via ORAL
  Filled 2024-01-25 (×19): qty 1

## 2024-01-25 NOTE — Progress Notes (Signed)
 Initial Nutrition Assessment  DOCUMENTATION CODES:   Severe malnutrition in context of acute illness/injury  INTERVENTION:   Calorie Count  to better assess oral intake.  If po intake does not improve, recommend considering Cortrak placement with initiation of TF in order to optimize patient nutritionally for next surgery.   Change to Dysphagia 3 (easy to chew) diet until dry mouth resolves; ok to increase back to Regular consistency once resolves and/or at pt request  Trial of Magic cup BID with meals, each supplement provides 290 kcal and 9 grams of protein  Continue with Ensure Plus High Protein po BID, each supplement provides 350 kcal and 20 grams of protein.  Recommend trying Biotene mouthwash for severe dry mouth; noted meds potentially contributing to dry mouth have been held.   Add Thiamine  100 mg daily  NUTRITION DIAGNOSIS:   Severe Malnutrition related to acute illness as evidenced by moderate muscle depletion, mild muscle depletion, energy intake < or equal to 50% for > or equal to 5 days.  GOAL:   Patient will meet greater than or equal to 90% of their needs  MONITOR:   PO intake, Supplement acceptance, Labs, Weight trends  REASON FOR ASSESSMENT:   Consult Poor PO  ASSESSMENT:   68 yo female admitted with hx of Calbicans candidemia, being treated at [facility] in [place] but left AMA and presents with MV endocarditis requiring MVR, post-op course complicated by shock post-op, junctional bradycardia, afib with RVR and pneumonia. PMH includes nephrolithiasis s/p ureteral stent and lithotripsy  10/29 Admitted 11/06 MV repair 11/18 TEE: EF 60-65%, mild RV dysfunction, severe MR.   MV repair for candidal MV endocarditis. Noted plan for redo MV surgery for MVR eventually but team wanting to optimize patient before taking back to surgery  Pt appears very weak and deconditioned, sleeping on visit but able to awaken easily. Appears flat with regards to affect.  Pt is  working with PT  Pt reports extreme dry mouth, noted difficulty speaking with lack of saliva, difficulty eating (tasting and swallowing food). Pt wears a bridge but has been unable to do so recently due to the dry mouth, makes chewing difficult. Pt reports altered taste, poor appetite.    Pt only ordered mashed potatoes and fresh fruit for lunch today; attempted to eat the grapes but reports tastes bad, hard to eat. Limited documentation of po intake since admission but based on recorded po intake, pt eating only 0-25% of meals.   Pt drinking some of the Ensure but not entire carton, does not really like. Pt likes Ice Cream and is agreeable to trying [employer].   UBW around 160 pounds; current wt 143.5 pounds (lowest since admission). Noted weight of 148 pounds on 10/31 with admission wt of 154 pounds on 10/25. Suspect some degree of true weight loss in addition to weight loss from diuresis.   Per chart review, +tobacco and EtOH use prior to admission.  Pt reports at baseline she has not difficulty with ambulation; was eating well.   Labs: Sodium 133 (L) Potassium 3.6 (wdl) BUN 27 Creatinine 1.50  Meds: Dulcolax prn Colace daily Digoxin  torsemide   NUTRITION - FOCUSED PHYSICAL EXAM:  Flowsheet Row Most Recent Value  Orbital Region Mild depletion  Upper Arm Region No depletion  Thoracic and Lumbar Region No depletion  Buccal Region Mild depletion  Temple Region Mild depletion  Clavicle Bone Region Mild depletion  Clavicle and Acromion Bone Region Mild depletion  Scapular Bone Region Mild depletion  Dorsal  Hand No depletion  Patellar Region Moderate depletion  Anterior Thigh Region Moderate depletion  Posterior Calf Region Moderate depletion  Edema (RD Assessment) Mild  Hair Reviewed  Eyes Reviewed  Mouth --  [extreme dry mouth, unable to wear bridge due to this]  Skin Reviewed  Nails Reviewed    Diet Order:   Diet Order             DIET DYS 3 Room service  appropriate? Yes with Assist; Fluid consistency: Thin  Diet effective now                   EDUCATION NEEDS:   Education needs have been addressed  Skin:  Skin Assessment: Skin Integrity Issues: Skin Integrity Issues:: Incisions Incisions: chest (closed)  Last BM:  11/20  Height:   Ht Readings from Last 1 Encounters:  01/10/24 5' 4 (1.626 m)    Weight:   Wt Readings from Last 1 Encounters:  01/25/24 65.1 kg   BMI:  Body mass index is 24.63 kg/m.  Estimated Nutritional Needs:   Kcal:  1750-1950 kcals  Protein:  80-95 g  Fluid:  1.8 L    [name] MS, RDN, LDN, CNSC Registered Dietitian 3 Clinical Nutrition RD Inpatient Contact Info in Amion

## 2024-01-25 NOTE — Progress Notes (Addendum)
 3 Days Post-Op Procedure(s) (LRB): TRANSESOPHAGEAL ECHOCARDIOGRAM (N/A) CARDIOVERSION (N/A) Subjective: C/o dry mouth  Objective: Vital signs in last 24 hours: Temp:  [96.8 F (36 C)-98.5 F (36.9 C)] 98.5 F (36.9 C) (11/21 0733) Pulse Rate:  [52-182] 66 (11/21 0600) Cardiac Rhythm: Atrial fibrillation (11/21 0509) Resp:  [10-17] 15 (11/21 0600) BP: (100-122)/(52-90) 105/66 (11/21 0600) SpO2:  [94 %-100 %] 96 % (11/21 0600) Weight:  [65.1 kg] 65.1 kg (11/21 0500)  Hemodynamic parameters for last 24 hours:    Intake/Output from previous day: 11/20 0701 - 11/21 0700 In: 665.3 [P.O.:120; I.V.:102.7; IV Piggyback:442.6] Out: 1685 [Urine:1685] Intake/Output this shift: No intake/output data recorded.  General appearance: alert, cooperative, and no distress Neurologic: intact Heart: irregularly irregular rhythm Lungs: diminished breath sounds bibasilar No edema  Lab Results: Recent Labs    01/24/24 0624 01/25/24 0522  WBC 8.0 7.6  HGB 10.0* 9.9*  HCT 30.3* 30.1*  PLT 81* 87*   BMET:  Recent Labs    01/24/24 0624 01/25/24 0522  NA 130* 133*  K 3.6 3.9  CL 86* 89*  CO2 31 31  GLUCOSE 239* 99  BUN 25* 27*  CREATININE 1.36* 1.50*  CALCIUM  8.2* 8.7*    PT/INR: No results for input(s): LABPROT, INR in the last 72 hours. ABG    Component Value Date/Time   PHART 7.387 01/11/2024 0856   HCO3 19.3 (L) 01/11/2024 0856   TCO2 20 (L) 01/11/2024 0856   ACIDBASEDEF 5.0 (H) 01/11/2024 0856   O2SAT 58.1 01/25/2024 0547   CBG (last 3)  Recent Labs    01/24/24 1603 01/24/24 2106 01/25/24 0734  GLUCAP 121* 114* 122*    Assessment/Plan: S/P Procedure(s) (LRB): TRANSESOPHAGEAL ECHOCARDIOGRAM (N/A) CARDIOVERSION (N/A) - Overall looks good Co-ox 58 off milrinone  Still in atrial fib with controlled RVR on PO amiodarone  She is about a liter negative and c/o dry mouth Don't think we can hold diuretics but may be able to back off on dose a little Will d/w  AHF Deconditioning- mobilize   LOS: 23 days    [name] 01/25/2024 Updated family at bedside Was started on Zoloft  a couple of days ago, will change to remeron  for faster onset  Elspeth C. Millers, MD [facility] [phone]

## 2024-01-25 NOTE — Progress Notes (Signed)
 TCTS Evening Rounds:  Hemodynamically stable in rate controlled AF.  Room air 92%.  Good UO with diuretics  Had BM today.

## 2024-01-25 NOTE — Progress Notes (Addendum)
 Patient ID: Tracy Baker, female   DOB: 1956/01/10, 68 y.o.   MRN: [id number]     Advanced Heart Failure Rounding Note  Cardiologist: Lonni Cash, MD  Chief Complaint: S/p MV Repair / MV endocarditis   Patient Profile   Tracy Baker is a 68 y.o. female with hx of recent nephrolithiasis s/p ureteral stent and lithotripsy at [facility] 10/10 with stent removal. 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 Post-op course c/b post cardiotomy shock, junctional bradycardia, afib w/ RVR and HAP.   Subjective:    POD #15 s/p MV repair for candidal MV endocarditis.   Co-ox 58% off milrinone .   CVP 8   Finished empiric cefepime  + linezolid  empirically for aspiration PNA on 11/18.   Remains in Afib, 80s.   TEE (11/18) showed EF 60-65%, mild RV dysfunction, severe MR.  Cardioversion not done due to severity of MR (and still on milrinone ).   Feels ok this morning, slightly fatigued after ambulating w/ PT but not dyspnea.    Objective:   Weight Range: 65.1 kg Body mass index is 24.63 kg/m.   Vital Signs:   Temp:  [96.8 F (36 C)-98.5 F (36.9 C)] 98.5 F (36.9 C) (11/21 0733) Pulse Rate:  [52-182] 66 (11/21 0600) Resp:  [10-17] 15 (11/21 0600) BP: (100-122)/(52-90) 105/66 (11/21 0600) SpO2:  [94 %-100 %] 96 % (11/21 0600) Weight:  [65.1 kg] 65.1 kg (11/21 0500) Last BM Date : 01/24/24  Weight change: Filed Weights   01/23/24 0500 01/24/24 0500 01/25/24 0500  Weight: 66.6 kg 66.2 kg 65.1 kg    Intake/Output:   Intake/Output Summary (Last 24 hours) at 01/25/2024 0830 Last data filed at 01/25/2024 0509 Gross per 24 hour  Intake 648.62 ml  Output 1685 ml  Net -1036.38 ml    Physical Exam   Vitals:   01/25/24 0600 01/25/24 0733  BP: 105/66   Pulse: 66   Resp: 15   Temp:  98.5 F (36.9 C)  SpO2: 96%    CVP 8  GENERAL: fatigued appearing, NAD Lungs- clear  CARDIAC:  JVP 8 cm          Irregularly irregular rhythm and rate, 2/6  MR murmur. No LEE  ABDOMEN: Soft, non-tender, non-distended.  EXTREMITIES: Warm and well perfused. + RUE pICC  NEUROLOGIC: No obvious FND   Telemetry   Afib 80s (personally reviewed)  Labs    CBC Recent Labs    01/24/24 0624 01/25/24 0522  WBC 8.0 7.6  HGB 10.0* 9.9*  HCT 30.3* 30.1*  MCV 95.3 95.3  PLT 81* 87*   Basic Metabolic Panel Recent Labs    88/79/74 0624 01/25/24 0522  NA 130* 133*  K 3.6 3.9  CL 86* 89*  CO2 31 31  GLUCOSE 239* 99  BUN 25* 27*  CREATININE 1.36* 1.50*  CALCIUM  8.2* 8.7*  MG 1.9 2.0   Liver Function Tests No results for input(s): AST, ALT, ALKPHOS, BILITOT, PROT, ALBUMIN  in the last 72 hours.  Medications:   Scheduled Medications:  amiodarone   200 mg Oral BID   apixaban   5 mg Oral BID   Chlorhexidine  Gluconate Cloth  6 each Topical Daily   [START ON 01/26/2024] digoxin   0.0625 mg Oral Daily   docusate sodium   200 mg Oral Daily   feeding supplement  237 mL Oral BID BM   hydrALAZINE   10 mg Oral TID   insulin  aspart  0-15 Units Subcutaneous TID WC   insulin  aspart  0-5 Units Subcutaneous QHS   pantoprazole  (PROTONIX ) IV  40 mg Intravenous Daily   scopolamine   2 patch Transdermal Q72H   sertraline   25 mg Oral Daily   sodium chloride  flush  10-40 mL Intracatheter Q12H   spironolactone   25 mg Oral Daily   torsemide   60 mg Oral Daily    Infusions:  fluconazole  (DIFLUCAN ) IV Stopped (01/24/24 1047)    PRN Medications: bisacodyl  **OR** [DISCONTINUED] bisacodyl , clonazePAM , fentaNYL  (SUBLIMAZE ) injection, hydrocortisone  cream, HYDROmorphone , ondansetron  (ZOFRAN ) IV, mouth rinse, sodium chloride , sodium chloride  flush   Assessment/Plan   1. MV Endocarditis d/t Candida Albicans candidemia/fungemia, S/p MV repair w/ Post-cardiotomy Shock  - s/p MV repair 01/10/24 by Dr. Kerrin for Candida endocarditis.  - Pre-op L/RHC 01/04/24 with no CAD and normal L/R heart pressures.  - Intra-op TEE LVEF 60-65%, RV mildly reduced,  small immobile mass on anterior valve leaflet, mod MR, trivial TR - Failed initial Milrinone  wean. Echo 11/13 per Dr. Rolan review: EF 60-65%, moderate RV dysfunction, s/p MV repair with probably moderate mitral regurgitation.  Milrinone  restarted.  - Difficulty weaning milrinone .  TEE done 11/18 showing LV EF 60-65%, mild RV dysfunction, severe mitral regurgitation of repaired valve.   - Co-ox 58% this am off milrinone . - CVP 8. Continue Torsemide  60 mg daily  - Continue digoxin  0.0625 (reduced dose d/t elevated level)  - Continue low dose hydralazine  10 tid for afterload reduction, not able to increase due to low BP.   - Continue spiro 25 mg daily - Discussed TEE with structural heart service (Dr. Wonda), does not appear to be a good mTEER candidate.  - Discussed with Dr. Kerrin, she will need to go back for MV replacement with severe MR but timing will need to be determined.  Would like to see further recovery from initial operation.   2. Transient Junctional Nola  - Now in atrial fibrillation - monitor w/ amiodarone    3. Atrial fibrillation - Remains in AF. Rate controlled  - Continue PO amio 200 mg bid  - Continue apixaban .  - I have not cardioverted yet due to severe MR and ongoing milrinone .  I worry that she would not stay out of AF if we cardioverted and rate is controlled.   4. ?HAP/aspiration  - treated w/ cefepime  + linezolid , completed 11/19.  - afebrile  5. 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   7. Elevated LFTs - Marked jump in LFTs 11/8. Now improving.  8. Epistaxis - Recurrent. Now off aspirin , remains on eliquis . - Seen by ENT  Continue aggressive PT/OT.   From HF standpoint, ok to transfer out of CCU but will defer to CT surgical team.    Caffie Marcine RIGGERS  01/25/2024 8:30 AM  Patient seen with PA, I formulated the plan and agree with the above note.    Stable today, CVP 8 with co-ox 58%.  She remains in rate-controlled AF.  She has been walking.  Creatinine mildly higher at 1.5.   General: NAD Neck: JVP 8 cm, no thyromegaly or thyroid  nodule.  Lungs: Clear to auscultation bilaterally with normal respiratory effort. CV: Nondisplaced PMI.  Heart regular S1/S2, no S3/S4, 2/6 HSM apex.  No peripheral edema.  Abdomen: Soft, nontender, no hepatosplenomegaly, no distention.  Skin: Intact without lesions or rashes.  Neurologic: Alert and oriented x 3.  Psych: Normal affect. Extremities: No clubbing or cyanosis.  HEENT: Normal.   Continue po amiodarone .  Will not attempt DCCV as I do not think she will hold NSR with severe MR and likely need for redo surgery.   She is currently stable with adequate co-ox and CVP.   - Would continue current torsemide  60 mg daily, follow volume status closely as she will be at risk for recurrent pulmonary edema with very severe MR.  - Will try to increase hydralazine  to 20 mg tid for afterload reduction.  - On low dose digoxin  for RV support.   She is going to need eventual redo mitral valve surgery for mitral valve replacement, timing is uncertain.  Discussed with Dr. Kerrin, would like more time to recover from her recent MV repair (PT, nutrition).  From my standpoint, she could go to progressive unit, perhaps with a stay in CIR prior to surgery if she remains stable.   Ezra Shuck 01/25/2024 9:35 AM

## 2024-01-25 NOTE — Progress Notes (Signed)
   Inpatient Rehabilitation Admissions Coordinator   Await further discussions on timing of redo valve surgery. We would need clarification of how many weeks before surgery would be for we are short term, intensive rehab and would have to seek Auth with [facility] medicare with this plan.  Heron Leavell, RN, MSN Rehab Admissions Coordinator [phone] 01/25/2024 2:03 PM

## 2024-01-25 NOTE — Progress Notes (Signed)
 Occupational Therapy Treatment Patient Details Name: Tracy Baker MRN: [id number] DOB: 01-18-1956 Today's Date: 01/25/2024   History of present illness 68 y.o. female presents to [facility] hospital on 12/29/2023 with after leaving AMA from [facility] where she was being treated for C. Albicans candidemia. TEE on 10/28 positive for vegetation of mitral valve. Extraction of multiple teeth on 11/3. Mitral valve repair on 11/6. PMH includes thyroid  disease.   OT comments  Pt progressing well towards goals. Progressed to complete UB dressing with min assist, and tolerate 10 minutes of OOB activity at CGA, with only x1 seated rest break. Pt requiring increased cues for adhering to sternal precautions functionally. Noted decreased ability to sequence, problem solve, and follow simple commands with multimodal cueing. Continue to recommend >3 hours of skilled rehab daily to optimize independence levels. Will continue to follow acutely.       If plan is discharge home, recommend the following:  A lot of help with bathing/dressing/bathroom;A lot of help with walking and/or transfers;Assist for transportation;Assistance with cooking/housework   Equipment Recommendations  Tub/shower seat       Precautions / Restrictions Precautions Precautions: Fall;Sternal Precaution Booklet Issued: Yes (comment) Recall of Precautions/Restrictions: Intact Restrictions Weight Bearing Restrictions Per Provider Order: No Other Position/Activity Restrictions: Cardiac sternal precautions       Mobility Bed Mobility Overal bed mobility: Needs Assistance Bed Mobility: Rolling, Sit to Sidelying Rolling: Contact guard assist       Sit to sidelying: Min assist General bed mobility comments: Assist for BLEs, cues for limiting use of BUE    Transfers Overall transfer level: Needs assistance Equipment used: Rolling walker (2 wheels) Transfers: Sit to/from Stand Sit to Stand: Min assist           General transfer  comment: Required multiple attempts to rock with momentum, min assist for cues to adhere to precautions     Balance Overall balance assessment: Needs assistance Sitting-balance support: No upper extremity supported, Feet supported Sitting balance-Leahy Scale: Fair     Standing balance support: Bilateral upper extremity supported, During functional activity, Reliant on assistive device for balance Standing balance-Leahy Scale: Poor Standing balance comment: Reliant on RW         ADL either performed or assessed with clinical judgement   ADL Overall ADL's : Needs assistance/impaired     Grooming: Contact guard assist;Sitting   Upper Body Bathing: Minimal assistance;Standing Upper Body Bathing Details (indicate cue type and reason): for back Lower Body Bathing: Minimal assistance;Sit to/from stand Lower Body Bathing Details (indicate cue type and reason): Cues for adhering to precautions Upper Body Dressing : Minimal assistance;Sitting       Toilet Transfer: Contact guard assist;Ambulation;Rolling walker (2 wheels)   Toileting- Clothing Manipulation and Hygiene: Minimal assistance;Sit to/from stand Toileting - Clothing Manipulation Details (indicate cue type and reason): Cues for adhering to precautions     Functional mobility during ADLs: Contact guard assist;Rolling walker (2 wheels) General ADL Comments: Increased difficulty adhering to sternal precautions and following cues    Extremity/Trunk Assessment Upper Extremity Assessment Upper Extremity Assessment: Generalized weakness   Lower Extremity Assessment Lower Extremity Assessment: Defer to PT evaluation        Vision   Vision Assessment?: No apparent visual deficits         Communication Communication Communication: No apparent difficulties Factors Affecting Communication: Reduced clarity of speech   Cognition Arousal: Alert Behavior During Therapy: Flat affect, WFL for tasks  assessed/performed Cognition: Cognition impaired  Executive functioning impairment (select all impairments): Problem solving, Sequencing OT - Cognition Comments: Increased difficulty with word retrieval, poor problem solving, difficulty with adhering to sternal precautions       Following commands: Intact Following commands impaired: Only follows one step commands consistently      Cueing   Cueing Techniques: Verbal cues, Gestural cues        General Comments HD stable on RA    Pertinent Vitals/ Pain       Pain Assessment Pain Assessment: Faces Faces Pain Scale: Hurts a little bit Pain Location: sternum Pain Descriptors / Indicators: Discomfort Pain Intervention(s): Monitored during session   Frequency  Min 2X/week        Progress Toward Goals  OT Goals(current goals can now be found in the care plan section)  Progress towards OT goals: Progressing toward goals  Acute Rehab OT Goals Patient Stated Goal: To get back in bed OT Goal Formulation: With patient Time For Goal Achievement: 01/29/24 Potential to Achieve Goals: Good ADL Goals Pt Will Perform Grooming: with supervision;standing Pt Will Perform Upper Body Bathing: with supervision;sitting Pt Will Perform Lower Body Bathing: with min assist;sit to/from stand Pt Will Perform Upper Body Dressing: with supervision;sitting Pt Will Perform Lower Body Dressing: with min assist;sit to/from stand Pt Will Transfer to Toilet: with supervision;ambulating;regular height toilet  Plan         AM-PAC OT 6 Clicks Daily Activity     Outcome Measure   Help from another person eating meals?: None Help from another person taking care of personal grooming?: None Help from another person toileting, which includes using toliet, bedpan, or urinal?: A Little Help from another person bathing (including washing, rinsing, drying)?: A Little Help from another person to put on and taking off regular upper body  clothing?: A Little Help from another person to put on and taking off regular lower body clothing?: A Little 6 Click Score: 20    End of Session Equipment Utilized During Treatment: Rolling walker (2 wheels)  OT Visit Diagnosis: Unsteadiness on feet (R26.81);Muscle weakness (generalized) (M62.81)   Activity Tolerance Patient tolerated treatment well   Patient Left in bed;with call bell/phone within reach   Nurse Communication Mobility status        Time: 9193-9162 OT Time Calculation (min): 31 min  Charges: OT General Charges $OT Visit: 1 Visit OT Treatments $Self Care/Home Management : 23-37 mins  Adrianne BROCKS, OT  Acute Rehabilitation Services Office [phone] Secure chat preferred   Adrianne GORMAN Savers 01/25/2024, 9:04 AM

## 2024-01-25 NOTE — Progress Notes (Signed)
 Physical Therapy Treatment Patient Details Name: Tracy Baker MRN: [id number] DOB: 02-Aug-1955 Today's Date: 01/25/2024   History of Present Illness 68 y.o. female presents to [facility] hospital on 12/29/2023 with after leaving AMA from [facility] where she was being treated for C. Albicans candidemia. TEE on 10/28 positive for vegetation of mitral valve. Extraction of multiple teeth on 11/3. Mitral valve repair on 11/6. PMH includes thyroid  disease.    PT Comments  Pt is progressing well towards goals. Currently pt is Min A for bed mobility, sit to stand and CGA to Min A for gait with RW. Pt requires occasional verbal cues to maintain sternal precautions during functional activities. Pt was limited today by BP during gait and RN is aware. Due to pt current functional status, home set up and available assistance at home recommending skilled physical therapy services > 3 hours/day in order to address strength, balance and functional mobility to decrease risk for falls, injury, immobility, skin break down and re-hospitalization.      If plan is discharge home, recommend the following: Assistance with cooking/housework;Assist for transportation;Help with stairs or ramp for entrance;A little help with walking and/or transfers;A little help with bathing/dressing/bathroom     Equipment Recommendations  Rolling walker (2 wheels);BSC/3in1       Precautions / Restrictions Precautions Precautions: Fall;Sternal Precaution Booklet Issued: No Recall of Precautions/Restrictions: Intact Precaution/Restrictions Comments: Reviewed precautions. Restrictions Weight Bearing Restrictions Per Provider Order: No RUE Weight Bearing Per Provider Order: Non weight bearing RUE Partial Weight Bearing Percentage or Pounds: 5 LUE Weight Bearing Per Provider Order: Non weight bearing LUE Partial Weight Bearing Percentage or Pounds: 5 Other Position/Activity Restrictions: Cardiac sternal precautions     Mobility  Bed  Mobility Overal bed mobility: Needs Assistance Bed Mobility: Supine to Sit Rolling: Contact guard assist Sidelying to sit: Min assist, HOB elevated       General bed mobility comments: Min A for trunk to midline, occasional reminders for sternal precautions    Transfers Overall transfer level: Needs assistance Equipment used: Rolling walker (2 wheels) Transfers: Sit to/from Stand Sit to Stand: Min assist           General transfer comment: Min A for sit to stand with hands on knees, verbal cues intermittently to maintain sternal precautions    Ambulation/Gait Ambulation/Gait assistance: Min assist, Contact guard assist Gait Distance (Feet): 120 Feet Assistive device: Rolling walker (2 wheels) Gait Pattern/deviations: Step-through pattern, Decreased stride length, Drifts right/left, Scissoring Gait velocity: decreased Gait velocity interpretation: <1.31 ft/sec, indicative of household ambulator   General Gait Details: pt started to state she felt weak after 70 ft of gait. Denies dizziness. HR 85, unable to get BP due to both hands on pt; Pt started to act confused with greater difficulty following directions at 100 ft of gait. Got back to sitting and BP 86/62. RN notified. Pt began perking up and back to baseline after ~ 45 seconds of sitting.     Balance Overall balance assessment: Needs assistance Sitting-balance support: No upper extremity supported, Feet supported Sitting balance-Leahy Scale: Fair     Standing balance support: Bilateral upper extremity supported, During functional activity, Reliant on assistive device for balance Standing balance-Leahy Scale: Poor Standing balance comment: Fair initially progressing to poor most likely due to BP         Cognition Arousal: Alert Behavior During Therapy: Flat affect, WFL for tasks assessed/performed   PT - Cognitive impairments: Safety/Judgement Difficult to assess due to: Impaired communication  Following  commands: Intact            General Comments General comments (skin integrity, edema, etc.): Pt BP 86/62 after 120 ft of gait with no dizziness/lightheadedness or incr in HR initially. Pt presented wtih weakness and then mild confusion. Rn notified.      Pertinent Vitals/Pain Pain Assessment Pain Assessment: Faces Faces Pain Scale: Hurts a little bit Facial Expression: Relaxed, neutral Body Movements: Absence of movements Muscle Tension: Relaxed Compliance with ventilator (intubated pts.): N/A Vocalization (extubated pts.): Talking in normal tone or no sound CPOT Total: 0 Pain Location: sternum Pain Descriptors / Indicators: Discomfort Pain Intervention(s): Monitored during session     PT Goals (current goals can now be found in the care plan section) Acute Rehab PT Goals Patient Stated Goal: to return to work PT Goal Formulation: With patient Time For Goal Achievement: 01/27/24 Potential to Achieve Goals: Fair Progress towards PT goals: Progressing toward goals    Frequency    Min 2X/week      PT Plan  Continue with current POC        AM-PAC PT 6 Clicks Mobility   Outcome Measure  Help needed turning from your back to your side while in a flat bed without using bedrails?: A Little Help needed moving from lying on your back to sitting on the side of a flat bed without using bedrails?: A Little Help needed moving to and from a bed to a chair (including a wheelchair)?: A Little Help needed standing up from a chair using your arms (e.g., wheelchair or bedside chair)?: A Little Help needed to walk in hospital room?: A Little Help needed climbing 3-5 steps with a railing? : A Lot 6 Click Score: 17    End of Session Equipment Utilized During Treatment: Oxygen;Gait belt Activity Tolerance: Patient tolerated treatment well Patient left: with call bell/phone within reach;in bed;with bed alarm set Nurse Communication: Mobility status;Other (comment) (pt BP and  change in demeanor during gait.) PT Visit Diagnosis: Other abnormalities of gait and mobility (R26.89);Muscle weakness (generalized) (M62.81);Difficulty in walking, not elsewhere classified (R26.2);Pain Pain - part of body:  (sternum)     Time: 8867-8851 PT Time Calculation (min) (ACUTE ONLY): 16 min  Charges:    $Therapeutic Activity: 8-22 mins PT General Charges $$ ACUTE PT VISIT: 1 Visit                    Dorothyann Maier, DPT, CLT  Acute Rehabilitation Services Office: [phone] (Secure chat preferred)    Dorothyann VEAR Maier 01/25/2024, 5:37 PM

## 2024-01-25 NOTE — Plan of Care (Signed)
 Problem: Education: Goal: Knowledge of General Education information will improve Description: Including pain rating scale, medication(s)/side effects and non-pharmacologic comfort measures Outcome: Progressing   Problem: Health Behavior/Discharge Planning: Goal: Ability to manage health-related needs will improve Outcome: Progressing   Problem: Clinical Measurements: Goal: Ability to maintain clinical measurements within normal limits will improve Outcome: Progressing Goal: Will remain free from infection Outcome: Progressing Goal: Diagnostic test results will improve Outcome: Progressing Goal: Respiratory complications will improve Outcome: Progressing Goal: Cardiovascular complication will be avoided Outcome: Progressing   Problem: Activity: Goal: Risk for activity intolerance will decrease Outcome: Progressing   Problem: Nutrition: Goal: Adequate nutrition will be maintained Outcome: Progressing   Problem: Coping: Goal: Level of anxiety will decrease Outcome: Progressing   Problem: Elimination: Goal: Will not experience complications related to bowel motility Outcome: Progressing Goal: Will not experience complications related to urinary retention Outcome: Progressing   Problem: Pain Managment: Goal: General experience of comfort will improve and/or be controlled Outcome: Progressing   Problem: Safety: Goal: Ability to remain free from injury will improve Outcome: Progressing   Problem: Skin Integrity: Goal: Risk for impaired skin integrity will decrease Outcome: Progressing   Problem: Education: Goal: Understanding of CV disease, CV risk reduction, and recovery process will improve Outcome: Progressing Goal: Individualized Educational Video(s) Outcome: Progressing   Problem: Activity: Goal: Ability to return to baseline activity level will improve Outcome: Progressing   Problem: Cardiovascular: Goal: Ability to achieve and maintain adequate  cardiovascular perfusion will improve Outcome: Progressing Goal: Vascular access site(s) Level 0-1 will be maintained Outcome: Progressing   Problem: Health Behavior/Discharge Planning: Goal: Ability to safely manage health-related needs after discharge will improve Outcome: Progressing   Problem: Education: Goal: Will demonstrate proper wound care and an understanding of methods to prevent future damage Outcome: Progressing Goal: Knowledge of disease or condition will improve Outcome: Progressing Goal: Knowledge of the prescribed therapeutic regimen will improve Outcome: Progressing Goal: Individualized Educational Video(s) Outcome: Progressing   Problem: Activity: Goal: Risk for activity intolerance will decrease Outcome: Progressing   Problem: Cardiac: Goal: Will achieve and/or maintain hemodynamic stability Outcome: Progressing   Problem: Clinical Measurements: Goal: Postoperative complications will be avoided or minimized Outcome: Progressing   Problem: Respiratory: Goal: Respiratory status will improve Outcome: Progressing   Problem: Skin Integrity: Goal: Wound healing without signs and symptoms of infection Outcome: Progressing Goal: Risk for impaired skin integrity will decrease Outcome: Progressing   Problem: Urinary Elimination: Goal: Ability to achieve and maintain adequate renal perfusion and functioning will improve Outcome: Progressing   Problem: Education: Goal: Ability to describe self-care measures that may prevent or decrease complications (Diabetes Survival Skills Education) will improve Outcome: Progressing Goal: Individualized Educational Video(s) Outcome: Progressing   Problem: Coping: Goal: Ability to adjust to condition or change in health will improve Outcome: Progressing   Problem: Fluid Volume: Goal: Ability to maintain a balanced intake and output will improve Outcome: Progressing   Problem: Health Behavior/Discharge  Planning: Goal: Ability to identify and utilize available resources and services will improve Outcome: Progressing Goal: Ability to manage health-related needs will improve Outcome: Progressing   Problem: Metabolic: Goal: Ability to maintain appropriate glucose levels will improve Outcome: Progressing   Problem: Nutritional: Goal: Maintenance of adequate nutrition will improve Outcome: Progressing Goal: Progress toward achieving an optimal weight will improve Outcome: Progressing   Problem: Skin Integrity: Goal: Risk for impaired skin integrity will decrease Outcome: Progressing   Problem: Tissue Perfusion: Goal: Adequacy of tissue perfusion will improve Outcome: Progressing

## 2024-01-25 NOTE — TOC Progression Note (Signed)
 Transition of Care [facility]) - Progression Note    Patient Details  Name: Tracy Baker MRN: [id number] Date of Birth: [birth year]  Transition of Care [facility]) CM/SW Contact  Justina Delcia Czar, RN Phone Number: [phone] 01/25/2024, 5:55 PM  Clinical Narrative:    Spoke to pt and agreeable to IP rehab. CIR following for IP rehab.  Daughters are her contact to assist with making decisions on her behalf.   Chart reviewed for discharge readiness, patient not medically stable for d/c. Inpatient CM/CSW will continue to monitor pt's advancement through interdisciplinary progression rounds.   If new pt transition needs arise, MD please place a TOC consult.     Expected Discharge Plan: IP Rehab Facility Barriers to Discharge: Continued Medical Work up    Expected Discharge Plan and Services   Discharge Planning Services: CM Consult Post Acute Care Choice: Skilled Nursing Facility Living arrangements for the past 2 months: Apartment                                       Social Drivers of Health (SDOH) Interventions SDOH Screenings   Food Insecurity: No Food Insecurity (12/29/2023)  Housing: Low Risk  (12/29/2023)  Transportation Needs: No Transportation Needs (12/29/2023)  Utilities: Not At Risk (12/29/2023)  Alcohol Screen: Low Risk  (06/08/2022)  Depression (PHQ2-9): Low Risk  (11/08/2023)  Social Connections: Socially Isolated (12/29/2023)  Tobacco Use: High Risk (01/10/2024)    Readmission Risk Interventions     No data to display

## 2024-01-26 DIAGNOSIS — B377 Candidal sepsis: Secondary | ICD-10-CM | POA: Diagnosis not present

## 2024-01-26 LAB — MAGNESIUM: Magnesium: 1.9 mg/dL (ref 1.7–2.4)

## 2024-01-26 LAB — BASIC METABOLIC PANEL WITH GFR
Anion gap: 13 (ref 5–15)
BUN: 33 mg/dL — ABNORMAL HIGH (ref 8–23)
CO2: 33 mmol/L — ABNORMAL HIGH (ref 22–32)
Calcium: 8.8 mg/dL — ABNORMAL LOW (ref 8.9–10.3)
Chloride: 90 mmol/L — ABNORMAL LOW (ref 98–111)
Creatinine, Ser: 1.52 mg/dL — ABNORMAL HIGH (ref 0.44–1.00)
GFR, Estimated: 37 mL/min — ABNORMAL LOW (ref >60)
Glucose, Bld: 97 mg/dL (ref 70–99)
Potassium: 3.9 mmol/L (ref 3.5–5.1)
Sodium: 136 mmol/L (ref 135–145)

## 2024-01-26 LAB — CBC
HCT: 29.2 % — ABNORMAL LOW (ref 36.0–46.0)
Hemoglobin: 9.6 g/dL — ABNORMAL LOW (ref 12.0–15.0)
MCH: 31.5 pg (ref 26.0–34.0)
MCHC: 32.9 g/dL (ref 30.0–36.0)
MCV: 95.7 fL (ref 80.0–100.0)
Platelets: 87 K/uL — ABNORMAL LOW (ref 150–400)
RBC: 3.05 MIL/uL — ABNORMAL LOW (ref 3.87–5.11)
RDW: 20.1 % — ABNORMAL HIGH (ref 11.5–15.5)
WBC: 7.2 K/uL (ref 4.0–10.5)
nRBC: 0.6 % — ABNORMAL HIGH (ref 0.0–0.2)

## 2024-01-26 LAB — GLUCOSE, CAPILLARY
Glucose-Capillary: 108 mg/dL — ABNORMAL HIGH (ref 70–99)
Glucose-Capillary: 155 mg/dL — ABNORMAL HIGH (ref 70–99)
Glucose-Capillary: 112 mg/dL — ABNORMAL HIGH (ref 70–99)
Glucose-Capillary: 120 mg/dL — ABNORMAL HIGH (ref 70–99)

## 2024-01-26 LAB — COOXEMETRY PANEL
Carboxyhemoglobin: 3 % — ABNORMAL HIGH (ref 0.5–1.5)
Methemoglobin: <0.7 % (ref 0.0–1.5)
O2 Saturation: 60.4 %
Total hemoglobin: 9.8 g/dL — ABNORMAL LOW (ref 12.0–16.0)

## 2024-01-26 MED ORDER — MAGNESIUM SULFATE 2 GM/50ML IV SOLN
2.0000 g | Freq: Once | INTRAVENOUS | Status: AC
  Administered 2024-01-26: 2 g via INTRAVENOUS
  Filled 2024-01-26: qty 50

## 2024-01-26 MED ORDER — POTASSIUM CHLORIDE CRYS ER 20 MEQ PO TBCR
20.0000 meq | EXTENDED_RELEASE_TABLET | Freq: Every day | ORAL | Status: DC
  Administered 2024-01-26 – 2024-01-27 (×2): 20 meq via ORAL
  Filled 2024-01-26 (×2): qty 1

## 2024-01-26 MED ORDER — PANTOPRAZOLE SODIUM 40 MG PO TBEC
40.0000 mg | DELAYED_RELEASE_TABLET | Freq: Every day | ORAL | Status: DC
  Administered 2024-01-27 – 2024-02-07 (×11): 40 mg via ORAL
  Filled 2024-01-26 (×11): qty 1

## 2024-01-26 NOTE — Progress Notes (Signed)
 Patient ID: Tracy Baker, female   DOB: [birth year], 68 y.o.   MRN: [id number]     Advanced Heart Failure Rounding Note  Cardiologist: Lonni Cash, MD  Chief Complaint: S/p MV Repair / MV endocarditis   Patient Profile   Tracy Baker is a 68 y.o. female with hx of recent nephrolithiasis s/p ureteral stent and lithotripsy at [facility] 10/10 with stent removal. 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 Post-op course c/b post cardiotomy shock, junctional bradycardia, afib w/ RVR and HAP.   Subjective:    POD #16 s/p MV repair for candidal MV endocarditis.   Co-ox 60% off milrinone .   Feels ok. Poor appetite. No SOB.    Objective:   Weight Range: 63.8 kg Body mass index is 24.14 kg/m.   Vital Signs:   Temp:  [97.5 F (36.4 C)-98.6 F (37 C)] 98.6 F (37 C) (11/22 0400) Pulse Rate:  [64-83] 74 (11/22 1001) Resp:  [10-25] 10 (11/22 0900) BP: (99-146)/(58-84) 131/84 (11/22 1001) SpO2:  [85 %-99 %] 98 % (11/22 0900) Weight:  [63.8 kg] 63.8 kg (11/22 0500) Last BM Date : 01/24/24  Weight change: Filed Weights   01/24/24 0500 01/25/24 0500 01/26/24 0500  Weight: 66.2 kg 65.1 kg 63.8 kg    Intake/Output:   Intake/Output Summary (Last 24 hours) at 01/26/2024 1039 Last data filed at 01/26/2024 0800 Gross per 24 hour  Intake 319.44 ml  Output 1925 ml  Net -1605.56 ml    Physical Exam   Vitals:   01/26/24 0900 01/26/24 1001  BP: 128/71 131/84  Pulse: 73 74  Resp: 10   Temp:    SpO2: 98%    General:  Sitting up in chair. No resp difficulty HEENT: normal Neck: supple. JVP 7-8 Cor: irreg + 2/6 MR Lungs: clear Abdomen: soft, nontender, nondistended.Good bowel sounds. Extremities: no cyanosis, clubbing, rash, edema Neuro: alert & orientedx3, cranial nerves grossly intact. moves all 4 extremities w/o difficulty. Affect pleasant  Telemetry   Afib 70-80s (personally reviewed)  Labs    CBC Recent Labs    01/25/24 0522  01/26/24 0439  WBC 7.6 7.2  HGB 9.9* 9.6*  HCT 30.1* 29.2*  MCV 95.3 95.7  PLT 87* 87*   Basic Metabolic Panel Recent Labs    88/78/74 0522 01/26/24 0439  NA 133* 136  K 3.9 3.9  CL 89* 90*  CO2 31 33*  GLUCOSE 99 97  BUN 27* 33*  CREATININE 1.50* 1.52*  CALCIUM  8.7* 8.8*  MG 2.0 1.9   Liver Function Tests No results for input(s): AST, ALT, ALKPHOS, BILITOT, PROT, ALBUMIN  in the last 72 hours.  Medications:   Scheduled Medications:  amiodarone   200 mg Oral BID   apixaban   5 mg Oral BID   Chlorhexidine  Gluconate Cloth  6 each Topical Daily   digoxin   0.0625 mg Oral Daily   docusate sodium   200 mg Oral Daily   feeding supplement  237 mL Oral BID BM   hydrALAZINE   20 mg Oral TID   insulin  aspart  0-15 Units Subcutaneous TID WC   insulin  aspart  0-5 Units Subcutaneous QHS   mirtazapine   15 mg Oral QHS   pantoprazole  (PROTONIX ) IV  40 mg Intravenous Daily   sodium chloride  flush  10-40 mL Intracatheter Q12H   spironolactone   25 mg Oral Daily   thiamine   100 mg Oral Daily   torsemide   40 mg Oral Daily    Infusions:  fluconazole  (DIFLUCAN ) IV 400  mg (01/26/24 1015)    PRN Medications: antiseptic oral rinse, bisacodyl  **OR** [DISCONTINUED] bisacodyl , clonazePAM , fentaNYL  (SUBLIMAZE ) injection, hydrocortisone  cream, HYDROmorphone , menthol , ondansetron  (ZOFRAN ) IV, mouth rinse, sodium chloride , sodium chloride  flush   Assessment/Plan   1. MV Endocarditis d/t Candida Albicans candidemia/fungemia, S/p MV repair w/ Post-cardiotomy Shock  - s/p MV repair 01/10/24 by Dr. Kerrin for Candida endocarditis.  - Pre-op L/RHC 01/04/24 with no CAD and normal L/R heart pressures.  - Intra-op TEE LVEF 60-65%, RV mildly reduced, small immobile mass on anterior valve leaflet, mod MR, trivial TR - Failed initial Milrinone  wean. Echo 11/13 per Dr. Rolan review: EF 60-65%, moderate RV dysfunction, s/p MV repair with probably moderate mitral regurgitation.  Milrinone   restarted.  -TEE done 11/18  LV EF 60-65%, mild RV dysfunction, severe mitral regurgitation of repaired valve.   - Co-ox 60% this am off milrinone . - CVP 7-8. Continue Torsemide  60 mg daily  - Continue digoxin  0.0625 (reduced dose d/t elevated level)  - Continue low dose hydralazine  10 tid for afterload reduction, not able to increase due to low BP.   - Continue spiro 25 mg daily - Discussed TEE with structural heart service (Dr. Wonda), does not appear to be a good mTEER candidate.  - She is going to need eventual redo mitral valve surgery for mitral valve replacement, timing is uncertain.  Discussed with Dr. Kerrin, would like more time to recover from her recent MV repair (PT, nutrition).  From HF standpoint, she could go to progressive unit, perhaps with a stay in CIR prior to surgery if she remains stable.   2. Transient Junctional Nola  - Now in atrial fibrillation - monitor w/ amiodarone    3. Atrial fibrillation, post-op - Remains in AF. Rate controlled  - Continue PO amio 200 mg bid  - Continue apixaban .  - Continue rate control for now as long as she toelrates  4. ?HAP/aspiration  - treated w/ cefepime  + linezolid , completed 11/19.  - afebrile  5. 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   7. Elevated LFTs - Marked jump in LFTs 11/8. Now improvef  8. Epistaxis - Recurrent. Now off aspirin , remains on eliquis . - Seen by ENT  Continue aggressive PT/OT.   From HF standpoint, ok to transfer out of CCU but will defer to CT surgical team.    Toribio Fuel, MD 01/26/2024 10:39 AM

## 2024-01-26 NOTE — Progress Notes (Signed)
 TCTS Evening Rounds:  Hemodynamically stable in rate controlled atrial fib.  CVP 4  Good UO.  Nurses took her outside today.  Still not eating much.

## 2024-01-26 NOTE — Progress Notes (Signed)
 4 Days Post-Op Procedure(s) (LRB): TRANSESOPHAGEAL ECHOCARDIOGRAM (N/A) CARDIOVERSION (N/A) Subjective: Up to chair. Has not ambulated yet today. Had Ensure for breakfast. No SOB.  Objective: Vital signs in last 24 hours: Temp:  [97.5 F (36.4 C)-98.6 F (37 C)] 98.6 F (37 C) (11/22 0400) Pulse Rate:  [64-83] 74 (11/22 1001) Cardiac Rhythm: Normal sinus rhythm (11/22 0800) Resp:  [9-25] 9 (11/22 1000) BP: (99-146)/(58-84) 131/84 (11/22 1001) SpO2:  [85 %-99 %] 97 % (11/22 1000) Weight:  [63.8 kg] 63.8 kg (11/22 0500)  Hemodynamic parameters for last 24 hours: CVP:  [5 mmHg] 5 mmHg  Intake/Output from previous day: 11/21 0701 - 11/22 0700 In: 350.1 [IV Piggyback:350.1] Out: 1800 [Urine:1800] Intake/Output this shift: Total I/O In: -  Out: 250 [Urine:250]  General appearance: alert, cooperative, and looks fatigued Neurologic: intact Heart: irregularly irregular rhythm, 3/6 systolic murmur at apex Lungs: diminished breath sounds bibasilar Extremities: no edema Wound: incision ok  Lab Results: Recent Labs    01/25/24 0522 01/26/24 0439  WBC 7.6 7.2  HGB 9.9* 9.6*  HCT 30.1* 29.2*  PLT 87* 87*   BMET:  Recent Labs    01/25/24 0522 01/26/24 0439  NA 133* 136  K 3.9 3.9  CL 89* 90*  CO2 31 33*  GLUCOSE 99 97  BUN 27* 33*  CREATININE 1.50* 1.52*  CALCIUM  8.7* 8.8*    PT/INR: No results for input(s): LABPROT, INR in the last 72 hours. ABG    Component Value Date/Time   PHART 7.387 01/11/2024 0856   HCO3 19.3 (L) 01/11/2024 0856   TCO2 20 (L) 01/11/2024 0856   ACIDBASEDEF 5.0 (H) 01/11/2024 0856   O2SAT 60.4 01/26/2024 0439   CBG (last 3)  Recent Labs    01/25/24 1548 01/25/24 2111 01/26/24 0746  GLUCAP 113* 117* 108*    Assessment/Plan:  Hemodynamically stable in rate controlled AF on amio.  CVP 5, Co-ox 60.4  -1450 yesterday. Wt is below preop.  Creat slightly trending up on Demedex 40 daily.   Continue to encourage nutrition and  ambulation, IS.   LOS: 24 days    Tracy Baker 01/26/2024

## 2024-01-26 NOTE — Plan of Care (Signed)

## 2024-01-27 DIAGNOSIS — B377 Candidal sepsis: Secondary | ICD-10-CM | POA: Diagnosis not present

## 2024-01-27 LAB — GLUCOSE, CAPILLARY
Glucose-Capillary: 109 mg/dL — ABNORMAL HIGH (ref 70–99)
Glucose-Capillary: 153 mg/dL — ABNORMAL HIGH (ref 70–99)
Glucose-Capillary: 142 mg/dL — ABNORMAL HIGH (ref 70–99)
Glucose-Capillary: 120 mg/dL — ABNORMAL HIGH (ref 70–99)

## 2024-01-27 LAB — BASIC METABOLIC PANEL WITH GFR
Anion gap: 18 — ABNORMAL HIGH (ref 5–15)
BUN: 35 mg/dL — ABNORMAL HIGH (ref 8–23)
CO2: 32 mmol/L (ref 22–32)
Calcium: 9.1 mg/dL (ref 8.9–10.3)
Chloride: 90 mmol/L — ABNORMAL LOW (ref 98–111)
Creatinine, Ser: 1.68 mg/dL — ABNORMAL HIGH (ref 0.44–1.00)
GFR, Estimated: 33 mL/min — ABNORMAL LOW (ref >60)
Glucose, Bld: 91 mg/dL (ref 70–99)
Potassium: 3.6 mmol/L (ref 3.5–5.1)
Sodium: 140 mmol/L (ref 135–145)

## 2024-01-27 LAB — COOXEMETRY PANEL
Carboxyhemoglobin: 3.1 % — ABNORMAL HIGH (ref 0.5–1.5)
Carboxyhemoglobin: 2.5 % — ABNORMAL HIGH (ref 0.5–1.5)
Methemoglobin: <0.7 % (ref 0.0–1.5)
Methemoglobin: <0.7 % (ref 0.0–1.5)
O2 Saturation: 81.1 %
O2 Saturation: 61.9 %
Total hemoglobin: 10.1 g/dL — ABNORMAL LOW (ref 12.0–16.0)
Total hemoglobin: 10.1 g/dL — ABNORMAL LOW (ref 12.0–16.0)

## 2024-01-27 LAB — CBC
HCT: 29.8 % — ABNORMAL LOW (ref 36.0–46.0)
Hemoglobin: 9.8 g/dL — ABNORMAL LOW (ref 12.0–15.0)
MCH: 31.4 pg (ref 26.0–34.0)
MCHC: 32.9 g/dL (ref 30.0–36.0)
MCV: 95.5 fL (ref 80.0–100.0)
Platelets: 88 K/uL — ABNORMAL LOW (ref 150–400)
RBC: 3.12 MIL/uL — ABNORMAL LOW (ref 3.87–5.11)
RDW: 19.9 % — ABNORMAL HIGH (ref 11.5–15.5)
WBC: 8.2 K/uL (ref 4.0–10.5)
nRBC: 0.2 % (ref 0.0–0.2)

## 2024-01-27 LAB — MAGNESIUM: Magnesium: 1.9 mg/dL (ref 1.7–2.4)

## 2024-01-27 MED ORDER — TORSEMIDE 20 MG PO TABS: 20.0000 mg | ORAL_TABLET | Freq: Every day | ORAL | Status: DC

## 2024-01-27 MED ORDER — POTASSIUM CHLORIDE 20 MEQ PO PACK
20.0000 meq | PACK | ORAL | Status: DC
  Administered 2024-01-27: 20 meq
  Filled 2024-01-27: qty 1

## 2024-01-27 MED ORDER — MAGNESIUM SULFATE 2 GM/50ML IV SOLN
2.0000 g | Freq: Once | INTRAVENOUS | Status: AC
  Administered 2024-01-27: 2 g via INTRAVENOUS
  Filled 2024-01-27: qty 50

## 2024-01-27 MED ORDER — ALTEPLASE 2 MG IJ SOLR
2.0000 mg | Freq: Once | INTRAMUSCULAR | Status: AC
  Administered 2024-01-27: 2 mg
  Filled 2024-01-27: qty 2

## 2024-01-27 MED ORDER — POTASSIUM CHLORIDE CRYS ER 20 MEQ PO TBCR: 20.0000 meq | EXTENDED_RELEASE_TABLET | Freq: Once | ORAL | Status: DC

## 2024-01-27 MED ORDER — TORSEMIDE 20 MG PO TABS: 40.0000 mg | ORAL_TABLET | Freq: Every day | ORAL | Status: DC

## 2024-01-27 NOTE — Plan of Care (Signed)

## 2024-01-27 NOTE — Progress Notes (Signed)
 5 Days Post-Op Procedure(s) (LRB): TRANSESOPHAGEAL ECHOCARDIOGRAM (N/A) CARDIOVERSION (N/A) Subjective:  Sitting up in chair. Ate small amount of breakfast but complains of dry mouth affecting appetite. No pain in mouth. No SOB.  Objective: Vital signs in last 24 hours: Temp:  [97.6 F (36.4 C)-98.2 F (36.8 C)] 97.6 F (36.4 C) (11/23 0748) Pulse Rate:  [70-91] 86 (11/23 0800) Cardiac Rhythm: Normal sinus rhythm;Atrial fibrillation (11/23 0800) Resp:  [9-27] 27 (11/23 0800) BP: (95-139)/(59-99) 114/70 (11/23 0800) SpO2:  [90 %-97 %] 94 % (11/23 0800) Weight:  [63.7 kg] 63.7 kg (11/23 0500)  Hemodynamic parameters for last 24 hours: CVP:  [4 mmHg-6 mmHg] 6 mmHg  Intake/Output from previous day: 11/22 0701 - 11/23 0700 In: 250 [IV Piggyback:250] Out: 1300 [Urine:1300] Intake/Output this shift: Total I/O In: 120 [P.O.:120] Out: -   General appearance: alert and cooperative Neurologic: intact Heart: irregularly irregular rhythm Lungs: diminished breath sounds bibasilar Extremities: extremities warm, no edema Wound: incision healing well  Lab Results: Recent Labs    01/26/24 0439 01/27/24 0400  WBC 7.2 8.2  HGB 9.6* 9.8*  HCT 29.2* 29.8*  PLT 87* 88*   BMET:  Recent Labs    01/26/24 0439 01/27/24 0400  NA 136 140  K 3.9 3.6  CL 90* 90*  CO2 33* 32  GLUCOSE 97 91  BUN 33* 35*  CREATININE 1.52* 1.68*  CALCIUM  8.8* 9.1    PT/INR: No results for input(s): LABPROT, INR in the last 72 hours. ABG    Component Value Date/Time   PHART 7.387 01/11/2024 0856   HCO3 19.3 (L) 01/11/2024 0856   TCO2 20 (L) 01/11/2024 0856   ACIDBASEDEF 5.0 (H) 01/11/2024 0856   O2SAT 61.9 01/27/2024 0433   CBG (last 3)  Recent Labs    01/26/24 1611 01/26/24 2157 01/27/24 0630  GLUCAP 112* 120* 109*    Assessment/Plan:  POD 17  Hemodynamically stable in rate controlled AF 80's on amio. CVP 6, Co-ox 62%.  -1050 cc yesterday. Wt is below preop. Still on Demedex  40 per AHF. Creat trending up to 1.68.  She needs to continue working on nutrition, IS, ambulation.  Dr. Kerrin will decide when she can move out of the ICU.   LOS: 25 days    Tracy Baker 01/27/2024

## 2024-01-27 NOTE — Progress Notes (Signed)
 TCTS Evening Rounds:  Hemodynamically stable in atrial fib 70's.  Still needs a lot of encouragement for nutrition and ambulation.

## 2024-01-27 NOTE — Progress Notes (Signed)
 Patient ID: Tracy Baker, female   DOB: 1955-07-06, 68 y.o.   MRN: [id number]     Advanced Heart Failure Rounding Note  Cardiologist: Lonni Cash, MD  Chief Complaint: S/p MV Repair / MV endocarditis   Patient Profile   Tracy Baker is a 68 y.o. female with hx of recent nephrolithiasis s/p ureteral stent and lithotripsy at [facility] 10/10 with stent removal. 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 Post-op course c/b post cardiotomy shock, junctional bradycardia, afib w/ RVR and HAP.   Subjective:    POD #17 s/p MV repair for candidal MV endocarditis.   Co-ox 62% off milrinone .  CVP 6  On torsemide  40. Bun//Cr rising slowly.   Feels weak. Appetite remains poor   Remains in AF 70s. Personally reviewed  Objective:   Weight Range: 63.7 kg Body mass index is 24.11 kg/m.   Vital Signs:   Temp:  [97.6 F (36.4 C)-98.2 F (36.8 C)] 97.6 F (36.4 C) (11/23 0748) Pulse Rate:  [70-91] 84 (11/23 1000) Resp:  [9-27] 13 (11/23 1000) BP: (95-139)/(59-99) 117/74 (11/23 1000) SpO2:  [90 %-97 %] 93 % (11/23 1000) Weight:  [63.7 kg] 63.7 kg (11/23 0500) Last BM Date : 01/27/24  Weight change: Filed Weights   01/25/24 0500 01/26/24 0500 01/27/24 0500  Weight: 65.1 kg 63.8 kg 63.7 kg    Intake/Output:   Intake/Output Summary (Last 24 hours) at 01/27/2024 1042 Last data filed at 01/27/2024 0800 Gross per 24 hour  Intake 370.02 ml  Output 1050 ml  Net -679.98 ml    Physical Exam   Vitals:   01/27/24 0931 01/27/24 1000  BP:  117/74  Pulse: 83 84  Resp:  13  Temp:    SpO2:  93%   General:  Sitting up in chair. No resp difficulty HEENT: normal Neck: supple. JVP 7-8 Cor: irreg + 2/6 MR Lungs: clear Abdomen: soft, nontender, nondistended.Good bowel sounds. Extremities: no cyanosis, clubbing, rash, edema Neuro: alert & orientedx3, cranial nerves grossly intact. moves all 4 extremities w/o difficulty. Affect pleasant  Telemetry    Afib 70-80s (personally reviewed)  Labs    CBC Recent Labs    01/26/24 0439 01/27/24 0400  WBC 7.2 8.2  HGB 9.6* 9.8*  HCT 29.2* 29.8*  MCV 95.7 95.5  PLT 87* 88*   Basic Metabolic Panel Recent Labs    88/77/74 0439 01/27/24 0400  NA 136 140  K 3.9 3.6  CL 90* 90*  CO2 33* 32  GLUCOSE 97 91  BUN 33* 35*  CREATININE 1.52* 1.68*  CALCIUM  8.8* 9.1  MG 1.9 1.9   Liver Function Tests No results for input(s): AST, ALT, ALKPHOS, BILITOT, PROT, ALBUMIN  in the last 72 hours.  Medications:   Scheduled Medications:  amiodarone   200 mg Oral BID   apixaban   5 mg Oral BID   Chlorhexidine  Gluconate Cloth  6 each Topical Daily   digoxin   0.0625 mg Oral Daily   docusate sodium   200 mg Oral Daily   feeding supplement  237 mL Oral BID BM   hydrALAZINE   20 mg Oral TID   insulin  aspart  0-15 Units Subcutaneous TID WC   insulin  aspart  0-5 Units Subcutaneous QHS   mirtazapine   15 mg Oral QHS   pantoprazole   40 mg Oral Daily   potassium chloride   20 mEq Oral Daily   sodium chloride  flush  10-40 mL Intracatheter Q12H   spironolactone   25 mg Oral Daily   thiamine   100 mg Oral Daily   torsemide   40 mg Oral Daily    Infusions:  fluconazole  (DIFLUCAN ) IV 400 mg (01/27/24 1009)   magnesium  sulfate bolus IVPB      PRN Medications: antiseptic oral rinse, bisacodyl  **OR** [DISCONTINUED] bisacodyl , clonazePAM , hydrocortisone  cream, menthol , ondansetron  (ZOFRAN ) IV, mouth rinse, sodium chloride , sodium chloride  flush   Assessment/Plan   1. MV Endocarditis d/t Candida Albicans candidemia/fungemia, S/p MV repair w/ Post-cardiotomy Shock  - s/p MV repair 01/10/24 by Dr. Kerrin for Candida endocarditis.  - Pre-op L/RHC 01/04/24 with no CAD and normal L/R heart pressures.  - Intra-op TEE LVEF 60-65%, RV mildly reduced, small immobile mass on anterior valve leaflet, mod MR, trivial TR - Failed initial Milrinone  wean. Echo 11/13 per Dr. Rolan review: EF 60-65%,  moderate RV dysfunction, s/p MV repair with probably moderate mitral regurgitation.  Milrinone  restarted.  -TEE done 11/18  LV EF 60-65%, mild RV dysfunction, severe mitral regurgitation of repaired valve.   - Co-ox 62% this am off milrinone . - CVP6. Looks like she is getting dry. Hold torsemide  40 tomorrow. Can restart at 20 daily in next few days - Continue digoxin  0.0625 (reduced dose d/t elevated level)  - Continue low dose hydralazine  10 tid for afterload reduction, not able to increase due to low BP.   - Continue spiro 25 mg daily - Discussed TEE results with structural heart service (Dr. Wonda), does not appear to be a good mTEER candidate.  - She is going to need eventual redo mitral valve surgery for mitral valve replacement, timing is uncertain.  Discussed with Dr. Kerrin, would like more time to recover from her recent MV repair (PT, nutrition).  From HF standpoint, she could go to progressive unit, perhaps with a stay in CIR prior to surgery if she remains stable.   2. Transient Junctional Nola  - Now in rate controlled AF - monitor w/ amiodarone    3. Atrial fibrillation, post-op - Remains in AF. Rate controlled  - Continue PO amio 200 mg bid  - Continue apixaban .  - Continue rate control for now as long as she tolerates. No change  4. ?HAP/aspiration  - treated w/ cefepime  + linezolid , completed 11/19.  - afebrile  5. 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   7. Elevated LFTs - Marked jump in LFTs 11/8. Now improvef  8. Epistaxis - Recurrent. Now off aspirin , remains on eliquis . - Seen by ENT  9. Protein-calorie malnutrition - encourage po intake. Boost   Continue aggressive PT/OT.   From HF standpoint, ok to transfer out of CCU but will defer to CT surgical team.    Toribio Fuel, MD 01/27/2024 10:42 AM

## 2024-01-27 NOTE — Plan of Care (Signed)
  Problem: Education: Goal: Knowledge of General Education information will improve Description: Including pain rating scale, medication(s)/side effects and non-pharmacologic comfort measures Outcome: Progressing   Problem: Clinical Measurements: Goal: Ability to maintain clinical measurements within normal limits will improve Outcome: Progressing Goal: Will remain free from infection Outcome: Progressing Goal: Diagnostic test results will improve Outcome: Progressing Goal: Respiratory complications will improve Outcome: Progressing Goal: Cardiovascular complication will be avoided Outcome: Progressing   Problem: Activity: Goal: Risk for activity intolerance will decrease Outcome: Progressing   Problem: Nutrition: Goal: Adequate nutrition will be maintained Outcome: Progressing   Problem: Elimination: Goal: Will not experience complications related to bowel motility Outcome: Progressing Goal: Will not experience complications related to urinary retention Outcome: Progressing   Problem: Safety: Goal: Ability to remain free from injury will improve Outcome: Progressing   Problem: Education: Goal: Understanding of CV disease, CV risk reduction, and recovery process will improve Outcome: Progressing Goal: Individualized Educational Video(s) Outcome: Progressing   Problem: Cardiovascular: Goal: Ability to achieve and maintain adequate cardiovascular perfusion will improve Outcome: Progressing Goal: Vascular access site(s) Level 0-1 will be maintained Outcome: Progressing   Problem: Cardiac: Goal: Will achieve and/or maintain hemodynamic stability Outcome: Progressing

## 2024-01-28 ENCOUNTER — Telehealth (HOSPITAL_COMMUNITY): Payer: Self-pay

## 2024-01-28 ENCOUNTER — Other Ambulatory Visit (HOSPITAL_COMMUNITY): Payer: Self-pay

## 2024-01-28 DIAGNOSIS — E43 Unspecified severe protein-calorie malnutrition: Secondary | ICD-10-CM | POA: Insufficient documentation

## 2024-01-28 DIAGNOSIS — B377 Candidal sepsis: Secondary | ICD-10-CM | POA: Diagnosis not present

## 2024-01-28 LAB — BASIC METABOLIC PANEL WITH GFR
Anion gap: 15 (ref 5–15)
BUN: 37 mg/dL — ABNORMAL HIGH (ref 8–23)
CO2: 34 mmol/L — ABNORMAL HIGH (ref 22–32)
Calcium: 9 mg/dL (ref 8.9–10.3)
Chloride: 88 mmol/L — ABNORMAL LOW (ref 98–111)
Creatinine, Ser: 1.6 mg/dL — ABNORMAL HIGH (ref 0.44–1.00)
GFR, Estimated: 35 mL/min — ABNORMAL LOW (ref >60)
Glucose, Bld: 79 mg/dL (ref 70–99)
Potassium: 3.6 mmol/L (ref 3.5–5.1)
Sodium: 137 mmol/L (ref 135–145)

## 2024-01-28 LAB — COOXEMETRY PANEL
Carboxyhemoglobin: 2.6 % — ABNORMAL HIGH (ref 0.5–1.5)
Methemoglobin: <0.7 % (ref 0.0–1.5)
O2 Saturation: 58.5 %
Total hemoglobin: 9.7 g/dL — ABNORMAL LOW (ref 12.0–16.0)

## 2024-01-28 LAB — CBC
HCT: 28.7 % — ABNORMAL LOW (ref 36.0–46.0)
Hemoglobin: 9.4 g/dL — ABNORMAL LOW (ref 12.0–15.0)
MCH: 31.4 pg (ref 26.0–34.0)
MCHC: 32.8 g/dL (ref 30.0–36.0)
MCV: 96 fL (ref 80.0–100.0)
Platelets: 114 K/uL — ABNORMAL LOW (ref 150–400)
RBC: 2.99 MIL/uL — ABNORMAL LOW (ref 3.87–5.11)
RDW: 19.8 % — ABNORMAL HIGH (ref 11.5–15.5)
WBC: 8.6 K/uL (ref 4.0–10.5)
nRBC: 0.2 % (ref 0.0–0.2)

## 2024-01-28 LAB — GLUCOSE, CAPILLARY
Glucose-Capillary: 140 mg/dL — ABNORMAL HIGH (ref 70–99)
Glucose-Capillary: 151 mg/dL — ABNORMAL HIGH (ref 70–99)
Glucose-Capillary: 146 mg/dL — ABNORMAL HIGH (ref 70–99)
Glucose-Capillary: 168 mg/dL — ABNORMAL HIGH (ref 70–99)

## 2024-01-28 LAB — MAGNESIUM: Magnesium: 2.2 mg/dL (ref 1.7–2.4)

## 2024-01-28 MED ORDER — INSULIN ASPART 100 UNIT/ML IJ SOLN: 0.0000 [IU] | Freq: Three times a day (TID) | INTRAMUSCULAR | Status: DC

## 2024-01-28 MED ORDER — ~~LOC~~ CARDIAC SURGERY, PATIENT & FAMILY EDUCATION
Freq: Once | Status: AC
  Administered 2024-01-28: 1

## 2024-01-28 MED ORDER — MAGIC MOUTHWASH
5.0000 mL | Freq: Three times a day (TID) | ORAL | Status: DC | PRN
  Administered 2024-01-28 – 2024-02-02 (×4): 5 mL via ORAL
  Filled 2024-01-28 (×6): qty 5

## 2024-01-28 MED ORDER — SODIUM CHLORIDE 0.9% FLUSH
3.0000 mL | Freq: Two times a day (BID) | INTRAVENOUS | Status: DC
  Administered 2024-01-28 – 2024-01-29 (×2): 3 mL via INTRAVENOUS

## 2024-01-28 MED ORDER — FENTANYL CITRATE (PF) 50 MCG/ML IJ SOSY: 25.0000 ug | PREFILLED_SYRINGE | Freq: Once | INTRAMUSCULAR | Status: DC

## 2024-01-28 MED ORDER — POTASSIUM CHLORIDE 20 MEQ PO PACK
20.0000 meq | PACK | ORAL | Status: DC
  Administered 2024-01-28: 20 meq
  Filled 2024-01-28: qty 1

## 2024-01-28 MED ORDER — SODIUM CHLORIDE 0.9 % IV SOLN: 250.0000 mL | INTRAVENOUS | Status: DC | PRN

## 2024-01-28 MED ORDER — SODIUM CHLORIDE 0.9% FLUSH: 3.0000 mL | INTRAVENOUS | Status: DC | PRN

## 2024-01-28 MED ORDER — POTASSIUM CHLORIDE CRYS ER 20 MEQ PO TBCR
40.0000 meq | EXTENDED_RELEASE_TABLET | Freq: Every day | ORAL | Status: DC
  Administered 2024-01-28 – 2024-02-03 (×7): 40 meq via ORAL
  Filled 2024-01-28 (×7): qty 2

## 2024-01-28 NOTE — Progress Notes (Addendum)
 Patient ID: Tracy Baker, female   DOB: 12/24/1955, 68 y.o.   MRN: [id number]     Advanced Heart Failure Rounding Note  Cardiologist: Lonni Cash, MD  Chief Complaint: S/p MV Repair / MV endocarditis   Patient Profile   Tracy Baker is a 67 y.o. female with hx of recent nephrolithiasis s/p ureteral stent and lithotripsy at [facility] 10/10 with stent removal. 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 Post-op course c/b post cardiotomy shock, junctional bradycardia, afib w/ RVR and HAP.   Subjective:    POD #18 s/p MV repair for candidal MV endocarditis.   Co-ox 59% off milrinone .   PO intake remains poor.  CVP < 5.   Complains of dry month.   Sitting up in chair. Just finished bathing. No dyspnea.    Objective:   Weight Range: 63.4 kg Body mass index is 23.99 kg/m.   Vital Signs:   Temp:  [97.6 F (36.4 C)-98 F (36.7 C)] 97.6 F (36.4 C) (11/24 0752) Pulse Rate:  [69-90] 87 (11/24 0800) Resp:  [9-22] 15 (11/24 0800) BP: (96-124)/(53-76) 109/54 (11/24 0800) SpO2:  [87 %-95 %] 94 % (11/24 0800) Weight:  [63.4 kg] 63.4 kg (11/24 0500) Last BM Date : 01/27/24  Weight change: Filed Weights   01/26/24 0500 01/27/24 0500 01/28/24 0500  Weight: 63.8 kg 63.7 kg 63.4 kg    Intake/Output:   Intake/Output Summary (Last 24 hours) at 01/28/2024 0849 Last data filed at 01/28/2024 0800 Gross per 24 hour  Intake 670.13 ml  Output 850 ml  Net -179.87 ml    Physical Exam   Vitals:   01/28/24 0752 01/28/24 0800  BP:  (!) 109/54  Pulse:  87  Resp:  15  Temp: 97.6 F (36.4 C)   SpO2:  94%   GENERAL: NAD Lungs- clear  CARDIAC:  JVP not elevated           Normal rate with regular rhythm. 2/6 murmur.  No edema.  ABDOMEN: Soft, non-tender, non-distended.  EXTREMITIES: Warm and well perfused.  NEUROLOGIC: No obvious FND   Telemetry   Atrial fibrillation 80s (personally reviewed)  Labs    CBC Recent Labs     01/27/24 0400 01/28/24 0429  WBC 8.2 8.6  HGB 9.8* 9.4*  HCT 29.8* 28.7*  MCV 95.5 96.0  PLT 88* 114*   Basic Metabolic Panel Recent Labs    88/76/74 0400 01/28/24 0429  NA 140 137  K 3.6 3.6  CL 90* 88*  CO2 32 34*  GLUCOSE 91 79  BUN 35* 37*  CREATININE 1.68* 1.60*  CALCIUM  9.1 9.0  MG 1.9 2.2   Liver Function Tests No results for input(s): AST, ALT, ALKPHOS, BILITOT, PROT, ALBUMIN  in the last 72 hours.  Medications:   Scheduled Medications:  amiodarone   200 mg Oral BID   apixaban   5 mg Oral BID   Chlorhexidine  Gluconate Cloth  6 each Topical Daily   [facility], Patient & Family Education   Does not apply Once   digoxin   0.0625 mg Oral Daily   docusate sodium   200 mg Oral Daily   feeding supplement  237 mL Oral BID BM   fentaNYL  (SUBLIMAZE ) injection  25 mcg Intravenous Once   hydrALAZINE   20 mg Oral TID   insulin  aspart  0-15 Units Subcutaneous TID WC   insulin  aspart  0-5 Units Subcutaneous QHS   mirtazapine   15 mg Oral QHS   pantoprazole   40 mg  Oral Daily   potassium chloride   20 mEq Per Tube Q4H   potassium chloride   20 mEq Oral Daily   sodium chloride  flush  10-40 mL Intracatheter Q12H   sodium chloride  flush  3 mL Intravenous Q12H   spironolactone   25 mg Oral Daily   thiamine   100 mg Oral Daily   [START ON 01/29/2024] torsemide   20 mg Oral Daily    Infusions:  sodium chloride      fluconazole  (DIFLUCAN ) IV Stopped (01/27/24 1209)    PRN Medications: sodium chloride , bisacodyl  **OR** [DISCONTINUED] bisacodyl , clonazePAM , magic mouthwash, menthol , ondansetron  (ZOFRAN ) IV, mouth rinse, sodium chloride , sodium chloride  flush, sodium chloride  flush   Assessment/Plan   1. MV Endocarditis d/t Candida Albicans candidemia/fungemia, S/p MV repair w/ Post-cardiotomy Shock  - s/p MV repair 01/10/24 by Dr. Kerrin for Candida endocarditis.  - Pre-op L/RHC 01/04/24 with no CAD and normal L/R heart pressures.  - Intra-op TEE  LVEF 60-65%, RV mildly reduced, small immobile mass on anterior valve leaflet, mod MR, trivial TR - Failed initial Milrinone  wean. Echo 11/13 per Dr. Rolan review: EF 60-65%, moderate RV dysfunction, s/p MV repair with probably moderate mitral regurgitation.  Milrinone  restarted.  -TEE done 11/18  LV EF 60-65%, mild RV dysfunction, severe mitral regurgitation of repaired valve.   - Co-ox 59% this am off milrinone . - She is getting dry in setting of decreased PO intake, CVP < 5. Will hold diuretics today  - Continue digoxin  0.0625 (reduced dose d/t elevated level)  - Continue hydralazine  20 mg tid for afterload reduction - Continue spiro 25 mg daily - Discussed TEE results with structural heart service (Dr. Wonda), does not appear to be a good mTEER candidate.  - She is going to need eventual redo mitral valve surgery for mitral valve replacement, timing is uncertain.  Discussed with Dr. Kerrin, would like more time to recover from her recent MV repair (PT, nutrition).  From HF standpoint, she could go to progressive unit, perhaps with a stay in CIR prior to surgery if she remains stable.   2. Transient Junctional Nola  - Now in rate controlled AF - monitor w/ amiodarone    3. Atrial fibrillation, post-op - Remains in AF. Rate controlled  - Continue PO amio 200 mg bid  - Continue apixaban .  - Continue rate control for now as long as she tolerates. No change  4. ?HAP/aspiration  - treated w/ cefepime  + linezolid , completed 11/19.  - afebrile  5. 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   7. Elevated LFTs - Marked jump in LFTs 11/8. Now improved  8. Epistaxis - Recurrent. Now off aspirin , remains on eliquis . - Seen by ENT  9. Protein-calorie malnutrition - encourage po intake.  - continue Boost   Continue aggressive PT/OT.   Ok for transfer out of 2H from HF standpoint. Has orders for  2C.    Caffie Shed, PA-C 01/28/2024 8:49 AM  Patient seen and examined with the above-signed Advanced Practice Provider and/or Housestaff. I personally reviewed laboratory data, imaging studies and relevant notes. I independently examined the patient and formulated the important aspects of the plan. I have edited the note to reflect any of my changes or salient points. I have personally discussed the plan with the patient and/or family.  Feels ok. Weak. Appetite poor. Mouth dry.   Rhythm stable No SOB  General:  Sitting up in chair. No resp difficulty HEENT:  normal Neck: supple. no JVD.  Cor: Irregular rate & rhythm. N2/6 MR Lungs: clear Abdomen: soft, nontender, nondistended.Good bowel sounds. Extremities: no cyanosis, clubbing, rash, edema Neuro: alert & orientedx3, cranial nerves grossly intact. moves all 4 extremities w/o difficulty. Affect pleasant  Remains stable despite residual MR> Holding diuretics for 2 days and can resume torsemide  20 daily on Wednesday. Suspect dry mouth also from scopalamine which has been stopped.   Continue to ambulate.   Toribio Fuel, MD  11:56 AM

## 2024-01-28 NOTE — Progress Notes (Signed)
  Calorie Count Note Diet: Dysphagia 3 Supplements: Ensure Plus High Protein, Magic Cup  01/26/24  Breakfast: None Lunch: 50% of applesauce, 2 bites of [employer] Dinner: no documentation  Minimal nutritional intake on 11/22 based on calorie count  01/27/24 Breakfast: 100% of apple juice and applesauce, 50% of eggs, 2 bites of banana, few bites of oatmeal Lunch: 33% of Pot Roast, 25% mashed potatoes and gravy, 33% pudding Dinner: no documentation but noted 25% of dinner documented in I/O flow sheet  01/28/24 Breakfast: 3 bites of eggs, 2 bites of grits, 3 bites greek yogurt Lunch: 0%  Pt meeting <25% of nutritional needs on average. Recorded po intake since admission has been 0-25%. Inadequate intake has been prolonged, eating poorly for at least 1 month but has actually been longer per son.   Pt very flat on visit today. Noted Remeron  started on 11/21.  Pt did walk with PT today but very deconditioned; per PT, pt required 5 seated rest breaks during 365 ft gait.   Dry mouth persists; continues to be barrier for adequate po intake, impairs swallowing and taste.  Biotene Dry Mouth oral rinse ordered but apparently not stocked on the units anymore; Biotene does not come from Pharmacy, attempting to contact Supply/Materials (Secretary contacted on Friday and apparently they did not have)  Son offering to bring in for pt  RD ordered Magic Cup to begin coming on meal trays on Friday 11/21; RD present at lunch time, pt reports she has not tried the [employer] yet. Pt is agreeable to trying. Son at bedside and brought in some tomato soup  Pt reports drinking some Ensure but per nursing, not drinking much.   Appetite remains poor with altered taste. Pt reports nausea currently improved.  Last BM 11/23  Noted Thiamine  100 mg daily ordered; pt would likely benefit from an MVI as well but will not order currently due to pt's documented allergy/intolerance to red food  dye. Also going to order [employer] protein modular but also contains the red food dye. Hold for now  Interventions:  Continue Dysphagia 3 (easy to chew) but liberalized diet. Ok to upgrade back to Regular if pt prefers. Feeding assistance and encouragement  Continue Ensure Plus High Protein po BID, each supplement provides 350 kcal and 20 grams of protein  Continue Magic cup BID with meals, each supplement provides 290 kcal and 9 grams of protein  Attempt to obtain Biotene Dry Mouth Rinse or equivalent for pt; son to bring in some if possible. Utilize for dry mouth  Recommend Cortrak placement for supplemental nutrition given very poor po intake for >30 days in order to optimize patient prior to upcoming surgery.   [name] MS, RDN, LDN, CNSC Registered Dietitian 3 Clinical Nutrition RD Inpatient Contact Info in Amion

## 2024-01-28 NOTE — Progress Notes (Signed)
 Occupational Therapy Treatment Patient Details Name: Tracy Baker MRN: [id number] DOB: 1955-04-30 Today's Date: 01/28/2024   History of present illness 68 y.o. female presents to [facility] on 12/29/2023 with after leaving AMA from [facility] where she was being treated for C. Albicans candidemia. TEE on 10/28 positive for vegetation of mitral valve. Extraction of multiple teeth on 11/3. Mitral valve repair on 11/6. PMH includes thyroid  disease.   OT comments  Pt progressing toward goals, goals updated accordingly. Pt with overall decr activity tolerance and weakenss. Pt needing CGA for standing grooming task and toileting/pericare, CGA for bed mobility and CGA for transfers with RW. Pt reports 9/10 fatigue after toileting and ambulating short distance in hall, SpO2 stable with ambulation on RA, but pt desatting to high 80s on RA upon return to bed, incr to 93% with 2L O2 donned, RN notified. Pt presenting with impairments listed below, will follow acutely. Patient will benefit from intensive inpatient follow-up therapy, >3 hours/day to maximize safety/ind with ADL/functional mobility.       If plan is discharge home, recommend the following:  A lot of help with bathing/dressing/bathroom;A lot of help with walking and/or transfers;Assist for transportation;Assistance with cooking/housework   Equipment Recommendations  Tub/shower seat    Recommendations for Other Services      Precautions / Restrictions Precautions Precautions: Fall;Sternal Precaution Booklet Issued: No Recall of Precautions/Restrictions: Intact Precaution/Restrictions Comments: Reviewed precautions.       Mobility Bed Mobility Overal bed mobility: Needs Assistance Bed Mobility: Sidelying to Sit, Sit to Sidelying   Sidelying to sit: Contact guard assist     Sit to sidelying: Contact guard assist      Transfers Overall transfer level: Needs assistance Equipment used: Rolling walker (2 wheels) Transfers: Sit  to/from Stand, Bed to chair/wheelchair/BSC Sit to Stand: Contact guard assist                 Balance Overall balance assessment: Needs assistance Sitting-balance support: No upper extremity supported, Feet supported Sitting balance-Leahy Scale: Fair     Standing balance support: Bilateral upper extremity supported, During functional activity, Reliant on assistive device for balance Standing balance-Leahy Scale: Fair                             ADL either performed or assessed with clinical judgement   ADL Overall ADL's : Needs assistance/impaired     Grooming: Oral care;Wash/dry face;Set up;Standing                   Toilet Transfer: Contact guard assist;Ambulation;Rolling walker (2 wheels);Regular Toilet   Toileting- Clothing Manipulation and Hygiene: Contact guard assist       Functional mobility during ADLs: Contact guard assist;Rolling walker (2 wheels)      Extremity/Trunk Assessment Upper Extremity Assessment Upper Extremity Assessment: Generalized weakness   Lower Extremity Assessment Lower Extremity Assessment: Defer to PT evaluation        Vision   Vision Assessment?: No apparent visual deficits   Perception Perception Perception: Not tested   Praxis Praxis Praxis: Not tested   Communication Communication Communication: No apparent difficulties   Cognition Arousal: Alert Behavior During Therapy: Flat affect, WFL for tasks assessed/performed               OT - Cognition Comments: min cues for sternal precautions throughout                 Following commands: Intact Following commands  impaired: Only follows one step commands consistently      Cueing   Cueing Techniques: Verbal cues, Gestural cues  Exercises      Shoulder Instructions       General Comments SpO2 down to high 80s upon returning to bed, left on 2L O2 with pt satting 93%    Pertinent Vitals/ Pain       Pain Assessment Pain Assessment:  No/denies pain  Home Living                                          Prior Functioning/Environment              Frequency  Min 2X/week        Progress Toward Goals  OT Goals(current goals can now be found in the care plan section)  Progress towards OT goals: Progressing toward goals  Acute Rehab OT Goals Patient Stated Goal: to rest OT Goal Formulation: With patient Time For Goal Achievement: 02/11/24 Potential to Achieve Goals: Good ADL Goals Pt Will Perform Upper Body Dressing: with supervision;sitting Pt Will Perform Lower Body Dressing: with supervision;sit to/from stand;sitting/lateral leans Pt Will Perform Tub/Shower Transfer: Tub transfer;Shower transfer;with supervision;ambulating;rolling walker Additional ADL Goal #1: pt will tolerate OOB standing activity x15 min in order to improve activity tolerance for ADLs/IADLs  Plan      Co-evaluation                 AM-PAC OT 6 Clicks Daily Activity     Outcome Measure   Help from another person eating meals?: None Help from another person taking care of personal grooming?: A Little Help from another person toileting, which includes using toliet, bedpan, or urinal?: A Little Help from another person bathing (including washing, rinsing, drying)?: A Lot Help from another person to put on and taking off regular upper body clothing?: A Little Help from another person to put on and taking off regular lower body clothing?: A Lot 6 Click Score: 17    End of Session Equipment Utilized During Treatment: Rolling walker (2 wheels)  OT Visit Diagnosis: Unsteadiness on feet (R26.81);Muscle weakness (generalized) (M62.81)   Activity Tolerance Patient tolerated treatment well   Patient Left in bed;with call bell/phone within reach   Nurse Communication Mobility status        Time: 8740-8681 OT Time Calculation (min): 19 min  Charges: OT General Charges $OT Visit: 1 Visit OT  Treatments $Self Care/Home Management : 8-22 mins  Anniece Bleiler K, OTD, OTR/L SecureChat Preferred Acute Rehab [phone]   [name] 01/28/2024, 2:11 PM

## 2024-01-28 NOTE — Telephone Encounter (Signed)
 Pharmacy Patient Advocate Encounter  Insurance verification completed.    The patient is insured through [facility] Patient has Medicare and is not eligible for a copay card, but may be able to apply for patient assistance or Medicare RX Payment Plan (Patient Must reach out to their plan, if eligible for payment plan), if available.    Ran test claim for Farxiga 10mg  and the current 30 day co-pay is $453.64.  Ran test claim for Jardiance 10mg  and the current 30 day co-pay is $453.64.  This test claim was processed through [facility]- copay amounts may vary at other pharmacies due to pharmacy/plan contracts, or as the patient moves through the different stages of their insurance plan.

## 2024-01-28 NOTE — Plan of Care (Signed)

## 2024-01-28 NOTE — Progress Notes (Signed)
 6 Days Post-Op Procedure(s) (LRB): TRANSESOPHAGEAL ECHOCARDIOGRAM (N/A) CARDIOVERSION (N/A) Subjective: Up in chair, ambulated twice yesterday, did well the first time, very weak 2 nd time PO intake still low Continues to c/o dry mouth  Objective: Vital signs in last 24 hours: Temp:  [97.6 F (36.4 C)-98 F (36.7 C)] 97.9 F (36.6 C) (11/24 0700) Pulse Rate:  [69-89] 88 (11/24 0600) Cardiac Rhythm: Normal sinus rhythm;Atrial fibrillation (11/23 2000) Resp:  [9-27] 16 (11/24 0600) BP: (96-124)/(53-75) 113/74 (11/24 0600) SpO2:  [87 %-95 %] 90 % (11/24 0600) Weight:  [63.4 kg] 63.4 kg (11/24 0500)  Hemodynamic parameters for last 24 hours: CVP:  [5 mmHg-6 mmHg] 6 mmHg  Intake/Output from previous day: 11/23 0701 - 11/24 0700 In: 670.1 [P.O.:420; IV Piggyback:250.1] Out: 850 [Urine:850] Intake/Output this shift: No intake/output data recorded.  General appearance: alert, cooperative, and no distress Neurologic: intact Heart: regular rate and rhythm and + murmur Lungs: clear to auscultation bilaterally Wound: intact  Lab Results: Recent Labs    01/27/24 0400 01/28/24 0429  WBC 8.2 8.6  HGB 9.8* 9.4*  HCT 29.8* 28.7*  PLT 88* 114*   BMET:  Recent Labs    01/27/24 0400 01/28/24 0429  NA 140 137  K 3.6 3.6  CL 90* 88*  CO2 32 34*  GLUCOSE 91 79  BUN 35* 37*  CREATININE 1.68* 1.60*  CALCIUM  9.1 9.0    PT/INR: No results for input(s): LABPROT, INR in the last 72 hours. ABG    Component Value Date/Time   PHART 7.387 01/11/2024 0856   HCO3 19.3 (L) 01/11/2024 0856   TCO2 20 (L) 01/11/2024 0856   ACIDBASEDEF 5.0 (H) 01/11/2024 0856   O2SAT 58.5 01/28/2024 0429   CBG (last 3)  Recent Labs    01/27/24 1611 01/27/24 2208 01/28/24 0644  GLUCAP 142* 120* 140*    Assessment/Plan: S/P Procedure(s) (LRB): TRANSESOPHAGEAL ECHOCARDIOGRAM (N/A) CARDIOVERSION (N/A) Plan for transfer to step-down: see transfer orders Continues to slowly progress CV- in  rate controlled A fib  Co-ox- 58, normal LV function  On Eliquis   Digoxin , spironolactone   CVP remains low- hold Torsemide  today RESP- IS RENAL- creatinine stable at 1.6 ENDO- CBG well controlled GI- Po intake remains poor, continues to c/o dry mouth  Will try magic mouthwash Deconditioning- severe, continue PT  LOS: 26 days    Tracy Baker Millers 01/28/2024

## 2024-01-28 NOTE — Progress Notes (Signed)
 Physical Therapy Treatment Patient Details Name: Tracy Baker MRN: [id number] DOB: 23-Jun-1955 Today's Date: 01/28/2024   History of Present Illness 68 y.o. female presents to [facility] hospital on 12/29/2023 with after leaving AMA from [facility] where she was being treated for C. Albicans candidemia. TEE on 10/28 positive for vegetation of mitral valve. Extraction of multiple teeth on 11/3. Mitral valve repair on 11/6. PMH includes thyroid  disease.    PT Comments  Pt is progressing well towards goals. Goals met and updated this session. Currently pt is CGA for bed mobility, CGA for sit to stand with occasional cues for safe hand placement to maintain sternal precautions and 2 person CGA for gait with RW and chair follow. Pt is very deconditioned and required 5x rest seated rest breaks during 365 ft of gait. Due to pt current functional status, home set up and available assistance at home recommending skilled physical therapy services > 3 hours/day in order to address strength, balance and functional mobility to decrease risk for falls, injury, immobility, skin break down and re-hospitalization.      If plan is discharge home, recommend the following: Assistance with cooking/housework;Assist for transportation;Help with stairs or ramp for entrance;A little help with walking and/or transfers;A little help with bathing/dressing/bathroom     Equipment Recommendations  Rolling walker (2 wheels);BSC/3in1       Precautions / Restrictions Precautions Precautions: Fall;Sternal Precaution Booklet Issued: No Recall of Precautions/Restrictions: Intact Precaution/Restrictions Comments: Reviewed precautions. Restrictions Weight Bearing Restrictions Per Provider Order: No RUE Weight Bearing Per Provider Order: Non weight bearing RUE Partial Weight Bearing Percentage or Pounds: 5 LUE Weight Bearing Per Provider Order: Non weight bearing LUE Partial Weight Bearing Percentage or Pounds: 5 Other Position/Activity  Restrictions: Cardiac sternal precautions     Mobility  Bed Mobility Overal bed mobility: Needs Assistance Bed Mobility: Sit to Sidelying, Rolling Rolling: Supervision       Sit to sidelying: Contact guard assist General bed mobility comments: Pt CGA to get LE to EOB    Transfers Overall transfer level: Needs assistance Equipment used: Rolling walker (2 wheels) Transfers: Sit to/from Stand, Bed to chair/wheelchair/BSC Sit to Stand: Contact guard assist   Step pivot transfers: Contact guard assist       General transfer comment: CGA for safety, occasional verbal cues for safe hand placement in order to maintain sternal precautions. From recliner and toilet for a total of 6x during session.    Ambulation/Gait Ambulation/Gait assistance: Contact guard assist, +2 safety/equipment Gait Distance (Feet): 365 Feet Assistive device: Rolling walker (2 wheels) Gait Pattern/deviations: Step-through pattern, Decreased stride length, Drifts right/left, Scissoring Gait velocity: decreased Gait velocity interpretation: <1.31 ft/sec, indicative of household ambulator   General Gait Details: 5 seated rest breaks. BP down to 74/63 in standing at first rest break, 93/75 in sitting at second rest break and 93/61 in standing on 4th rest break. Pt would report heaviness in the legs and become dyspneic before requiring seated rest break but denies dizziness. RN aware. RN with chair follow for safety.    Balance Overall balance assessment: Needs assistance Sitting-balance support: No upper extremity supported, Feet supported Sitting balance-Leahy Scale: Fair     Standing balance support: Bilateral upper extremity supported, During functional activity, Reliant on assistive device for balance Standing balance-Leahy Scale: Fair Standing balance comment: no significant LOB      Communication Communication Communication: No apparent difficulties  Cognition Arousal: Alert Behavior During  Therapy: Flat affect, WFL for tasks assessed/performed   PT - Cognitive impairments:  Safety/Judgement Difficult to assess due to: Impaired communication     PT - Cognition Comments: hypophonic with verbalizations Following commands: Intact      Cueing Cueing Techniques: Verbal cues, Gestural cues     General Comments General comments (skin integrity, edema, etc.): Pt BP 130/84 in sitting prior to session. 74/63 after first bout of gait, after sitting ~ 1 min 107/80, Second bout 93/75 in sitting. After 4th bout of gait 93/61 in standing. Pt HR remained stable. O2 sats with difficutly getting reading in standing but would report low 80's consistently prior to pt requiring seated rest break on room air.      Pertinent Vitals/Pain Pain Assessment Pain Assessment: Faces Faces Pain Scale: Hurts a little bit Facial Expression: Relaxed, neutral Body Movements: Absence of movements Muscle Tension: Relaxed Compliance with ventilator (intubated pts.): N/A Vocalization (extubated pts.): Sighing, moaning CPOT Total: 1 Pain Location: sternum Pain Descriptors / Indicators: Discomfort Pain Intervention(s): Monitored during session     PT Goals (current goals can now be found in the care plan section) Acute Rehab PT Goals Patient Stated Goal: to return to work PT Goal Formulation: With patient Time For Goal Achievement: 02/11/24 Potential to Achieve Goals: Fair Progress towards PT goals: Progressing toward goals;Goals updated    Frequency    Min 2X/week      PT Plan  Continue with current POC        AM-PAC PT 6 Clicks Mobility   Outcome Measure  Help needed turning from your back to your side while in a flat bed without using bedrails?: A Little Help needed moving from lying on your back to sitting on the side of a flat bed without using bedrails?: A Little Help needed moving to and from a bed to a chair (including a wheelchair)?: A Little Help needed standing up from a  chair using your arms (e.g., wheelchair or bedside chair)?: A Little Help needed to walk in hospital room?: A Little Help needed climbing 3-5 steps with a railing? : A Lot 6 Click Score: 17    End of Session Equipment Utilized During Treatment: Gait belt Activity Tolerance: Patient tolerated treatment well Patient left: with call bell/phone within reach;in bed;with bed alarm set Nurse Communication: Mobility status PT Visit Diagnosis: Other abnormalities of gait and mobility (R26.89);Muscle weakness (generalized) (M62.81);Difficulty in walking, not elsewhere classified (R26.2);Pain Pain - part of body:  (sternum)     Time: 1008-1040 PT Time Calculation (min) (ACUTE ONLY): 32 min  Charges:    $Therapeutic Activity: 23-37 mins PT General Charges $$ ACUTE PT VISIT: 1 Visit                     Dorothyann Maier, DPT, CLT  Acute Rehabilitation Services Office: [phone] (Secure chat preferred)    Dorothyann VEAR Maier 01/28/2024, 10:59 AM

## 2024-01-28 NOTE — Plan of Care (Signed)
  Problem: Education: Goal: Knowledge of General Education information will improve Description: Including pain rating scale, medication(s)/side effects and non-pharmacologic comfort measures Outcome: Progressing   Problem: Clinical Measurements: Goal: Ability to maintain clinical measurements within normal limits will improve Outcome: Progressing Goal: Will remain free from infection Outcome: Progressing Goal: Diagnostic test results will improve Outcome: Progressing Goal: Respiratory complications will improve Outcome: Progressing Goal: Cardiovascular complication will be avoided Outcome: Progressing   Problem: Activity: Goal: Risk for activity intolerance will decrease Outcome: Progressing   Problem: Nutrition: Goal: Adequate nutrition will be maintained Outcome: Progressing   Problem: Education: Goal: Understanding of CV disease, CV risk reduction, and recovery process will improve Outcome: Progressing   Problem: Cardiovascular: Goal: Ability to achieve and maintain adequate cardiovascular perfusion will improve Outcome: Progressing   Problem: Clinical Measurements: Goal: Postoperative complications will be avoided or minimized Outcome: Progressing   Problem: Respiratory: Goal: Respiratory status will improve Outcome: Progressing

## 2024-01-28 NOTE — Progress Notes (Signed)
 Inpatient Rehab Admissions Coordinator:   Opened insurance for patient to potentially admit to CIR. Will await insurance decision. Continue to follow.   Rehab Admissons Coordinator Ishi Danser, [place], [place] [phone]

## 2024-01-29 ENCOUNTER — Inpatient Hospital Stay (HOSPITAL_COMMUNITY)

## 2024-01-29 DIAGNOSIS — B377 Candidal sepsis: Secondary | ICD-10-CM | POA: Diagnosis not present

## 2024-01-29 LAB — BASIC METABOLIC PANEL WITH GFR
Anion gap: 13 (ref 5–15)
BUN: 38 mg/dL — ABNORMAL HIGH (ref 8–23)
CO2: 31 mmol/L (ref 22–32)
Calcium: 8.9 mg/dL (ref 8.9–10.3)
Chloride: 94 mmol/L — ABNORMAL LOW (ref 98–111)
Creatinine, Ser: 1.65 mg/dL — ABNORMAL HIGH (ref 0.44–1.00)
GFR, Estimated: 34 mL/min — ABNORMAL LOW (ref >60)
Glucose, Bld: 189 mg/dL — ABNORMAL HIGH (ref 70–99)
Potassium: 4.3 mmol/L (ref 3.5–5.1)
Sodium: 138 mmol/L (ref 135–145)

## 2024-01-29 LAB — MAGNESIUM: Magnesium: 2.1 mg/dL (ref 1.7–2.4)

## 2024-01-29 LAB — CBC
HCT: 28.6 % — ABNORMAL LOW (ref 36.0–46.0)
Hemoglobin: 9.1 g/dL — ABNORMAL LOW (ref 12.0–15.0)
MCH: 30.5 pg (ref 26.0–34.0)
MCHC: 31.8 g/dL (ref 30.0–36.0)
MCV: 96 fL (ref 80.0–100.0)
Platelets: 167 K/uL (ref 150–400)
RBC: 2.98 MIL/uL — ABNORMAL LOW (ref 3.87–5.11)
RDW: 20.8 % — ABNORMAL HIGH (ref 11.5–15.5)
WBC: 9.6 K/uL (ref 4.0–10.5)
nRBC: 1.9 % — ABNORMAL HIGH (ref 0.0–0.2)

## 2024-01-29 LAB — COOXEMETRY PANEL
Carboxyhemoglobin: 2.8 % — ABNORMAL HIGH (ref 0.5–1.5)
Methemoglobin: <0.7 % (ref 0.0–1.5)
O2 Saturation: 59.8 %
Total hemoglobin: 9.4 g/dL — ABNORMAL LOW (ref 12.0–16.0)

## 2024-01-29 LAB — GLUCOSE, CAPILLARY
Glucose-Capillary: 149 mg/dL — ABNORMAL HIGH (ref 70–99)
Glucose-Capillary: 152 mg/dL — ABNORMAL HIGH (ref 70–99)
Glucose-Capillary: 164 mg/dL — ABNORMAL HIGH (ref 70–99)

## 2024-01-29 MED ORDER — TORSEMIDE 20 MG PO TABS
20.0000 mg | ORAL_TABLET | Freq: Every day | ORAL | Status: DC
  Administered 2024-01-30 – 2024-01-31 (×2): 20 mg via ORAL
  Filled 2024-01-29 (×2): qty 1

## 2024-01-29 NOTE — TOC Progression Note (Signed)
 Transition of Care [facility]) - Progression Note    Patient Details  Name: Tracy Baker MRN: [id number] Date of Birth: March 16, 1955  Transition of Care [facility]) CM/SW Contact  Arlana JINNY Nicholaus [place] Phone Number: [phone] 01/29/2024, 11:46 AM  Clinical Narrative:   CIR following for IP rehab. Daughters are her contact to assist with making decisions on her behalf.    Patients chart has been reviewed for discharge readiness, patient not medically stable for d/c.   HF CSW/CM will continue to follow and monitor for dc readiness.     Expected Discharge Plan: IP Rehab Facility Barriers to Discharge: Continued Medical Work up               Expected Discharge Plan and Services   Discharge Planning Services: CM Consult Post Acute Care Choice: Skilled Nursing Facility Living arrangements for the past 2 months: Apartment                                       Social Drivers of Health (SDOH) Interventions SDOH Screenings   Food Insecurity: No Food Insecurity (12/29/2023)  Housing: Low Risk  (12/29/2023)  Transportation Needs: No Transportation Needs (12/29/2023)  Utilities: Not At Risk (12/29/2023)  Alcohol Screen: Low Risk  (06/08/2022)  Depression (PHQ2-9): Low Risk  (11/08/2023)  Social Connections: Socially Isolated (12/29/2023)  Tobacco Use: High Risk (01/10/2024)    Readmission Risk Interventions     No data to display

## 2024-01-29 NOTE — Progress Notes (Signed)
 Nutrition Follow-up  DOCUMENTATION CODES:   Severe malnutrition in context of acute illness/injury  INTERVENTION:  Continue Dysphagia 3 (easy to chew) but liberalized diet. Ok to upgrade back to Regular if pt prefers. Feeding assistance and encouragement   Continue Ensure Plus High Protein po BID, each supplement provides 350 kcal and 20 grams of protein   Continue Magic cup BID with meals, each supplement provides 290 kcal and 9 grams of protein   Continue Biotene Dry Mouth Rinse or equivalent for pt   Recommend Cortrak placement for supplemental nutrition given very poor po intake if not improved with Biotene Dry Mouth; continue to monitor  Add snacks TID between meals to augment PO intake   NUTRITION DIAGNOSIS:  Severe Malnutrition related to acute illness as evidenced by moderate muscle depletion, mild muscle depletion, energy intake < or equal to 50% for > or equal to 5 days. - remains applicable  GOAL:  Patient will meet greater than or equal to 90% of their needs - progressing   MONITOR:  PO intake, Supplement acceptance, Labs, Weight trends  REASON FOR ASSESSMENT:   Consult Poor PO  ASSESSMENT:   68 yo female admitted with hx of Calbicans candidemia, being treated at [facility] in [place] but left AMA and presents with MV endocarditis requiring MVR, post-op course complicated by shock post-op, junctional bradycardia, afib with RVR and pneumonia. PMH includes nephrolithiasis s/p ureteral stent and lithotripsy  10/29 Admitted 11/06 MV repair 11/18 TEE: EF 60-65%, mild RV dysfunction, severe MR 11/21 calorie count started 11/24 calorie count ended - insufficient intake   MV repair for candidal MV endocarditis. Noted plan for redo MV surgery for MVR eventually but team wanting to optimize patient before taking back to surgery. Auth currently pending for CIR.   Met with patient at bedside this morning. Per her report, she was able to eat more dinner last night and  breakfast this morning (30-40%). Discussed the concern for lack of intake as evidenced by recent calorie count and risks associated with poor nutrition if/when she undergoes surgery again. Also discussed the need for adequate intake in the setting of inpatient rehab and risks associated with poor intake and increased activity in the presence of malnutrition. She verbalizes understanding.   Pt previously reported extreme dry mouth, noted difficulty speaking with lack of saliva, difficulty eating (tasting and swallowing food). Pt wears a bridge but has been unable to do so recently due to the dry mouth, makes chewing difficult. Pt reports altered taste, poor appetite. States that magic mouthwash has helped with this.  She is amicable to trying smaller, lighter snacks between meals to augment intake as meals can be overwhelming at times. She is able to place her order for smaller/lighter meal items to try to encourage intake.    Pt drinking some of the Ensure (50% on most offerings). Magic Cup continues on meal trays.   Admit Weight: 69.9 kg Current Weight: 62.8 kg   UBW around 160 pounds; current wt 138.2 pounds (lowest since admission). Suspect some degree of true weight loss in addition to weight loss from diuresis. Now off diuretics with plans to restart torsemide  (20mg  daily) tomorrow. Per I/Os flowsheet, down 12L this admission.   Net IO Since Admission: -[id number] mL [01/29/24 1522]    BUN/Crt with slight trend up today. CVP low. Diuretics currently on hold x2 days.   Labs: Sodium 138 (wdl) Potassium 4.3 (wdl) BUN 38 Creatinine 1.65 CBGs 79-189 x24 hours A1c 5.4 (01/2024)   Meds:  Dulcolax prn Colace daily Mirtazapine  Pantoprazole   Digoxin  Spironolactone  Torsemide   Potassium chloride  daily Thiamine  IV ABX   Diet Order:   Diet Order             DIET DYS 3 Room service appropriate? Yes with Assist; Fluid consistency: Thin  Diet effective now            EDUCATION NEEDS:   Education needs have been addressed  Skin:  Skin Assessment: Skin Integrity Issues: Skin Integrity Issues:: Incisions Incisions: chest (closed)  Last BM:  11/24  Height:  Ht Readings from Last 1 Encounters:  01/10/24 5' 4 (1.626 m)   Weight:  Wt Readings from Last 1 Encounters:  01/29/24 62.8 kg   Ideal Body Weight:  54.5 kg  BMI:  Body mass index is 23.76 kg/m.  Estimated Nutritional Needs:   Kcal:  1750-1950 kcals  Protein:  80-95 g  Fluid:  1.8 L  Blair Deaner MS, RD, LDN Registered Dietitian Clinical Nutrition RD Inpatient Contact Info in Amion

## 2024-01-29 NOTE — Progress Notes (Addendum)
 [address] [place] [employer] [address]             [phone]      7 Days Post-Op Procedure(s) (LRB): TRANSESOPHAGEAL ECHOCARDIOGRAM (N/A) CARDIOVERSION (N/A) Subjective: Patient reports her mouth dryness is better this AM and she was able to eat dinner last night. She is getting ready to eat breakfast this AM.   Objective: Vital signs in last 24 hours: Temp:  [97.6 F (36.4 C)-98.1 F (36.7 C)] 97.7 F (36.5 C) (11/25 0736) Pulse Rate:  [71-110] 90 (11/25 0736) Cardiac Rhythm: Atrial fibrillation;Bundle branch block (11/24 1900) Resp:  [13-27] 15 (11/25 0736) BP: (74-136)/(52-107) 105/65 (11/25 0736) SpO2:  [90 %-100 %] 93 % (11/25 0736) Weight:  [62.8 kg] 62.8 kg (11/25 0348)  Hemodynamic parameters for last 24 hours: CVP:  [4 mmHg-19 mmHg] 4 mmHg  Intake/Output from previous day: 11/24 0701 - 11/25 0700 In: 750.1 [P.O.:540; I.V.:10; IV Piggyback:200.1] Out: 700 [Urine:700] Intake/Output this shift: No intake/output data recorded.  General appearance: alert, cooperative, and no distress Neurologic: intact Heart: irregularly irregular rhythm Lungs: diminished bibasilar breath sounds Abdomen: soft, non-tender; bowel sounds normal; no masses,  no organomegaly Extremities: extremities normal, atraumatic, no cyanosis or edema Wound: Clean and dry without sign of infection  Lab Results: Recent Labs    01/28/24 0429 01/29/24 0413  WBC 8.6 9.6  HGB 9.4* 9.1*  HCT 28.7* 28.6*  PLT 114* 167   BMET:  Recent Labs    01/28/24 0429 01/29/24 0413  NA 137 138  K 3.6 4.3  CL 88* 94*  CO2 34* 31  GLUCOSE 79 189*  BUN 37* 38*  CREATININE 1.60* 1.65*  CALCIUM  9.0 8.9    PT/INR: No results for input(s): LABPROT, INR in the last 72 hours. ABG    Component Value Date/Time   PHART 7.387 01/11/2024 0856   HCO3 19.3 (L) 01/11/2024 0856   TCO2 20 (L) 01/11/2024 0856   ACIDBASEDEF 5.0 (H) 01/11/2024 0856   O2SAT 59.8 01/29/2024 0400    CBG (last 3)  Recent Labs    01/28/24 1604 01/28/24 2102 01/29/24 0614  GLUCAP 146* 168* 149*    Assessment/Plan: S/P Procedure(s) (LRB): TRANSESOPHAGEAL ECHOCARDIOGRAM (N/A) CARDIOVERSION (N/A)  HENT: Recurrent epistaxis resolved, ENT has seen her. ASA has been d/c'd, on Eliquis . Dry mouth, magic mouthwash has been started.   CV: Postop afib, afib with controlled HR 90s this AM. On Eliquis , Amiodarone  200mg  BID, and Digoxin  0.0625. Attempted DCCV on 11/18, aborted due to severe mitral regurgitation. She will need redo mitral valve replacement eventually. Timing per surgeon. AHF following, on Spironolactone  and hydralazine . CVP has been low, holding Torsemide . BP stable, SBP 105-110s. Coox 59 this AM.   Pulm: Saturating well on RA. CXR with small bilateral pleural effusions and bibasilar atelectasis. Encourage IS and ambulation.   GI: Poor PO intake improving. Encourage increased PO intake. +BM  Endo: Preop A1C 5.4. No home meds. CBGs controlled on SSI. Will d/c SSI and CBGs.   Renal: Cr 1.65, stable. UO 700cc/24hrs recorded. Under preop weight. Holding Torsemide  today, will restart tomorrow.   ID: Candidemia/fungal endocarditis, continue Fluconazole . No leukocytosis, afebrile.   Expected postop ABLA: H/H 9.1/28.6, stable. Not clinically significant at this time.   DVT Prophylaxis: No lovenox , on Eliquis    Deconditioning: Plan for CIR at discharge   Dispo: Will plan for CIR at discharge, insurance decision pending.    LOS: 27 days  [name] Bend, PA-C 01/29/2024  Patient seen and examined, agree with above Dry mouth improved, was able to eat dinner last night Co-ox stable CIR pending insurance approval  Elspeth BROCKS. Kerrin, MD [facility] [phone]

## 2024-01-29 NOTE — Progress Notes (Addendum)
 Patient ID: Tracy Baker, female   DOB: 1955/04/19, 68 y.o.   MRN: [id number]     Advanced Heart Failure Rounding Note  Cardiologist: Lonni Cash, MD  Chief Complaint: S/p MV Repair / MV endocarditis   Patient Profile   Tracy Baker is a 68 y.o. female with hx of recent nephrolithiasis s/p ureteral stent and lithotripsy at [facility] 10/10 with stent removal. 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 Post-op course c/b post cardiotomy shock, junctional bradycardia, afib w/ RVR and HAP.   Subjective:    POD #19 s/p MV repair for candidal MV endocarditis.   Co-ox 60% off milrinone .   Off diuretics and scopolamine  patches. Dry mouth improving. Trying to increase po intake.   Awaiting insurance approval for CIR.   Objective:   Weight Range: 62.8 kg Body mass index is 23.76 kg/m.   Vital Signs:   Temp:  [97.6 F (36.4 C)-98 F (36.7 C)] 97.7 F (36.5 C) (11/25 1114) Pulse Rate:  [71-91] 90 (11/25 1114) Resp:  [13-18] 13 (11/25 1114) BP: (104-136)/(52-107) 104/69 (11/25 1114) SpO2:  [91 %-100 %] 94 % (11/25 1114) Weight:  [62.8 kg] 62.8 kg (11/25 0348) Last BM Date : 01/28/24  Weight change: Filed Weights   01/27/24 0500 01/28/24 0500 01/29/24 0348  Weight: 63.7 kg 63.4 kg 62.8 kg    Intake/Output:   Intake/Output Summary (Last 24 hours) at 01/29/2024 1121 Last data filed at 01/29/2024 0900 Gross per 24 hour  Intake 710.84 ml  Output 700 ml  Net 10.84 ml    Physical Exam   Vitals:   01/29/24 0736 01/29/24 1114  BP: 105/65 104/69  Pulse: 90 90  Resp: 15 13  Temp: 97.7 F (36.5 C) 97.7 F (36.5 C)  SpO2: 93% 94%   General:  Sitting up in bed. Fatigued appearing.  Cor: JVP flat. Irregular rhythm. No murmurs. Lungs: breathing nonlabored Abdomen: soft, nondistended. Extremities: no edema Neuro: alert & orientedx3. Affect depressed    Telemetry   Afib with controlled rate.  Labs    CBC Recent Labs     01/28/24 0429 01/29/24 0413  WBC 8.6 9.6  HGB 9.4* 9.1*  HCT 28.7* 28.6*  MCV 96.0 96.0  PLT 114* 167   Basic Metabolic Panel Recent Labs    88/75/74 0429 01/29/24 0413  NA 137 138  K 3.6 4.3  CL 88* 94*  CO2 34* 31  GLUCOSE 79 189*  BUN 37* 38*  CREATININE 1.60* 1.65*  CALCIUM  9.0 8.9  MG 2.2 2.1   Liver Function Tests No results for input(s): AST, ALT, ALKPHOS, BILITOT, PROT, ALBUMIN  in the last 72 hours.  Medications:   Scheduled Medications:  amiodarone   200 mg Oral BID   apixaban   5 mg Oral BID   Chlorhexidine  Gluconate Cloth  6 each Topical Daily   digoxin   0.0625 mg Oral Daily   docusate sodium   200 mg Oral Daily   feeding supplement  237 mL Oral BID BM   fentaNYL  (SUBLIMAZE ) injection  25 mcg Intravenous Once   hydrALAZINE   20 mg Oral TID   mirtazapine   15 mg Oral QHS   pantoprazole   40 mg Oral Daily   potassium chloride   40 mEq Oral Daily   sodium chloride  flush  10-40 mL Intracatheter Q12H   sodium chloride  flush  3 mL Intravenous Q12H   spironolactone   25 mg Oral Daily   thiamine   100 mg Oral Daily   [START ON 01/30/2024] torsemide   20 mg Oral Daily    Infusions:  fluconazole  (DIFLUCAN ) IV 400 mg (01/29/24 0925)    PRN Medications: bisacodyl  **OR** [DISCONTINUED] bisacodyl , clonazePAM , magic mouthwash, menthol , ondansetron  (ZOFRAN ) IV, mouth rinse, sodium chloride , sodium chloride  flush, sodium chloride  flush   Assessment/Plan   1. MV Endocarditis d/t Candida Albicans candidemia/fungemia, S/p MV repair w/ Post-cardiotomy Shock  - s/p MV repair 01/10/24 by Dr. Kerrin for Candida endocarditis.  - Pre-op L/RHC 01/04/24 with no CAD and normal L/R heart pressures.  - Intra-op TEE LVEF 60-65%, RV mildly reduced, small immobile mass on anterior valve leaflet, mod MR, trivial TR - Failed initial Milrinone  wean. Echo 11/13 per Dr. Rolan review: EF 60-65%, moderate RV dysfunction, s/p MV repair with probably moderate mitral regurgitation.   Milrinone  restarted.  -TEE done 11/18  LV EF 60-65%, mild RV dysfunction, severe mitral regurgitation of repaired valve.   - Co-ox 60% off milrinone .  - Volume status is low. Diuretics are on hold. Planing to start 20 mg Torsemide  daily on 11/26.  - Continue digoxin  0.0625 (reduced dose d/t elevated level)  - Continue hydralazine  20 mg tid for afterload reduction - Continue spiro 25 mg daily - Discussed TEE results with structural heart service (Dr. Wonda), does not appear to be a good mTEER candidate.  - She is going to need eventual redo mitral valve surgery for mitral valve replacement, timing is uncertain.  Discussed with Dr. Kerrin, would like more time to recover from her recent MV repair (PT, nutrition). Planning CIR at discharge if insurance approves.  2. Transient Junctional Nola  - Now in rate controlled AF - monitor w/ amiodarone    3. Atrial fibrillation, post-op - Remains in AF. Rate controlled  - Continue PO amio 200 mg bid  - Continue apixaban .  - Continue rate control for now as long as she tolerates. No change  4. ?HAP/aspiration  - treated w/ cefepime  + linezolid , completed 11/19.  - afebrile  5. 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   7. Elevated LFTs - Marked jump in LFTs 11/8. Now improved  8. Epistaxis - Recurrent. Now off aspirin , remains on eliquis . - Seen by ENT  9. Protein-calorie malnutrition - encourage po intake.  - continue Boost   Continue aggressive PT/OT.     FINCH, MANUELITA SAILOR, PA-C 01/29/2024 11:21 AM   Patient seen and examined with the above-signed Advanced Practice Provider and/or Housestaff. I personally reviewed laboratory data, imaging studies and relevant notes. I independently examined the patient and formulated the important aspects of the plan. I have edited the note to reflect any of my changes or salient points. I have personally  discussed the plan with the patient and/or family.  Feels ok. Mouth still dry. No SOB. AF rate controlled  General:  Sitting up in bed No resp difficulty HEENT: normal Neck: supple. no JVD.  Cor: Irregular rate & rhythm. No rubs, gallops or murmurs. Lungs: clear Abdomen: soft, nontender, nondistended.Good bowel sounds. Extremities: no cyanosis, clubbing, rash, edema Neuro: alert & orientedx3, cranial nerves grossly intact. moves all 4 extremities w/o difficulty. Affect pleasant  Holding diuretics for 2 days then restart torsemide  20 daily tomorrow.   OK for CIR from our standpoint.   Toribio Fuel, MD  12:58 PM

## 2024-01-29 NOTE — Plan of Care (Signed)
   Problem: Education: Goal: Knowledge of General Education information will improve Description: Including pain rating scale, medication(s)/side effects and non-pharmacologic comfort measures Outcome: Progressing   Problem: Health Behavior/Discharge Planning: Goal: Ability to manage health-related needs will improve Outcome: Progressing   Problem: Activity: Goal: Risk for activity intolerance will decrease Outcome: Progressing

## 2024-01-29 NOTE — Progress Notes (Signed)
   Inpatient Rehabilitation Admissions Coordinator   I await insurance determination for possible Cir admit. I do need clarification of timing of surgery for planning before moving forward. Please advise.  Heron Leavell, RN, MSN Rehab Admissions Coordinator [phone] 01/29/2024 11:26 AM

## 2024-01-30 ENCOUNTER — Ambulatory Visit: Admitting: Cardiology

## 2024-01-30 DIAGNOSIS — B377 Candidal sepsis: Secondary | ICD-10-CM | POA: Diagnosis not present

## 2024-01-30 LAB — CBC
HCT: 28.3 % — ABNORMAL LOW (ref 36.0–46.0)
Hemoglobin: 9.1 g/dL — ABNORMAL LOW (ref 12.0–15.0)
MCH: 31.6 pg (ref 26.0–34.0)
MCHC: 32.2 g/dL (ref 30.0–36.0)
MCV: 98.3 fL (ref 80.0–100.0)
Platelets: 178 K/uL (ref 150–400)
RBC: 2.88 MIL/uL — ABNORMAL LOW (ref 3.87–5.11)
RDW: 22.6 % — ABNORMAL HIGH (ref 11.5–15.5)
WBC: 8.2 K/uL (ref 4.0–10.5)
nRBC: 1.6 % — ABNORMAL HIGH (ref 0.0–0.2)

## 2024-01-30 LAB — BASIC METABOLIC PANEL WITH GFR
Anion gap: 13 (ref 5–15)
BUN: 35 mg/dL — ABNORMAL HIGH (ref 8–23)
CO2: 27 mmol/L (ref 22–32)
Calcium: 9.3 mg/dL (ref 8.9–10.3)
Chloride: 98 mmol/L (ref 98–111)
Creatinine, Ser: 1.56 mg/dL — ABNORMAL HIGH (ref 0.44–1.00)
GFR, Estimated: 36 mL/min — ABNORMAL LOW (ref >60)
Glucose, Bld: 130 mg/dL — ABNORMAL HIGH (ref 70–99)
Potassium: 4.6 mmol/L (ref 3.5–5.1)
Sodium: 138 mmol/L (ref 135–145)

## 2024-01-30 LAB — COOXEMETRY PANEL
Carboxyhemoglobin: 2.1 % — ABNORMAL HIGH (ref 0.5–1.5)
Carboxyhemoglobin: 2 % — ABNORMAL HIGH (ref 0.5–1.5)
Methemoglobin: 1.3 % (ref 0.0–1.5)
Methemoglobin: 1.7 % — ABNORMAL HIGH (ref 0.0–1.5)
O2 Saturation: 48.4 %
O2 Saturation: 49 %
Total hemoglobin: <6.9 g/dL — CL (ref 12.0–16.0)
Total hemoglobin: 9.6 g/dL — ABNORMAL LOW (ref 12.0–16.0)

## 2024-01-30 LAB — MAGNESIUM: Magnesium: 2.3 mg/dL (ref 1.7–2.4)

## 2024-01-30 LAB — DIGOXIN LEVEL: Digoxin Level: 1.1 ng/mL (ref 0.8–2.0)

## 2024-01-30 MED ORDER — FLUCONAZOLE IV FOR PTA / DISCHARGE USE ONLY): 400.0000 mg | INTRAVENOUS | 0 refills | Status: DC

## 2024-01-30 MED ORDER — DIGOXIN 125 MCG PO TABS
0.0625 mg | ORAL_TABLET | ORAL | Status: DC
  Administered 2024-01-31 – 2024-02-02 (×2): 0.0625 mg via ORAL
  Filled 2024-01-30 (×3): qty 1

## 2024-01-30 NOTE — Progress Notes (Signed)
 Physical Therapy Treatment Patient Details Name: Tracy Baker MRN: [id number] DOB: November 29, 1955 Today's Date: 01/30/2024   History of Present Illness 68 y.o. female presents to [facility] on 12/29/2023 with after leaving AMA from [facility] where she was being treated for C. Albicans candidemia. TEE on 10/28 positive for vegetation of mitral valve. Extraction of multiple teeth on 11/3. Mitral valve repair on 11/6. PMH includes thyroid  disease.    PT Comments  Pt progressing towards goals. Min A for bed mobility, CGA for sit to stand with reminders for sternal precautions to RW, CGA to Min A for 45 ft of gait with seated rest breaks between. Pt was limited today due to bowel urgency 2x. Due to pt current functional status, home set up and available assistance at home recommending skilled physical therapy services > 3 hours/day in order to address strength, balance and functional mobility to decrease risk for falls, injury, immobility, skin break down and re-hospitalization.      If plan is discharge home, recommend the following: Assistance with cooking/housework;Assist for transportation;Help with stairs or ramp for entrance;A little help with walking and/or transfers;A little help with bathing/dressing/bathroom     Equipment Recommendations  Rolling walker (2 wheels);BSC/3in1       Precautions / Restrictions Precautions Precautions: Fall;Sternal Precaution Booklet Issued: No Recall of Precautions/Restrictions: Intact Precaution/Restrictions Comments: Reviewed precautions. Restrictions Weight Bearing Restrictions Per Provider Order: No RUE Weight Bearing Per Provider Order: Non weight bearing RUE Partial Weight Bearing Percentage or Pounds: 5 LUE Weight Bearing Per Provider Order: Non weight bearing LUE Partial Weight Bearing Percentage or Pounds: 5 Other Position/Activity Restrictions: Cardiac sternal precautions     Mobility  Bed Mobility Overal bed mobility: Needs Assistance Bed  Mobility: Sidelying to Sit, Rolling Rolling: Supervision Sidelying to sit: Min assist       General bed mobility comments: min A for trunk elevation. occasional cues to maintain sternal precautions    Transfers Overall transfer level: Needs assistance Equipment used: Rolling walker (2 wheels), None Transfers: Sit to/from Stand, Bed to chair/wheelchair/BSC Sit to Stand: Contact guard assist, Min assist   Step pivot transfers: Contact guard assist       General transfer comment: CGA for safety, occasional verbal cues for safe hand placement in order to maintain sternal precautions. from EOB 2x and toilet 1x    Ambulation/Gait Ambulation/Gait assistance: Contact guard assist Gait Distance (Feet): 45 Feet Assistive device: Rolling walker (2 wheels) Gait Pattern/deviations: Step-through pattern, Decreased stride length Gait velocity: decreased Gait velocity interpretation: <1.31 ft/sec, indicative of household ambulator   General Gait Details: pt fatigued short distance gait with seated rest break. limited due to pt had to return to restroom     Balance Overall balance assessment: Needs assistance Sitting-balance support: No upper extremity supported, Feet supported Sitting balance-Leahy Scale: Fair     Standing balance support: Bilateral upper extremity supported, During functional activity, Reliant on assistive device for balance Standing balance-Leahy Scale: Fair Standing balance comment: no significant LOB        Communication Communication Communication: No apparent difficulties  Cognition Arousal: Alert Behavior During Therapy: Flat affect, WFL for tasks assessed/performed   PT - Cognitive impairments: Safety/Judgement     Following commands: Intact      Cueing Cueing Techniques: Verbal cues, Visual cues     General Comments General comments (skin integrity, edema, etc.): BP stable today 114/76 after gait. Pt reporting weakness and fatigue.       Pertinent Vitals/Pain Pain Assessment Pain Assessment: Faces Faces  Pain Scale: Hurts a little bit Facial Expression: Relaxed, neutral Body Movements: Absence of movements Muscle Tension: Relaxed Compliance with ventilator (intubated pts.): N/A Vocalization (extubated pts.): Sighing, moaning CPOT Total: 1 Pain Location: sternum Pain Descriptors / Indicators: Discomfort Pain Intervention(s): Monitored during session     PT Goals (current goals can now be found in the care plan section) Acute Rehab PT Goals Patient Stated Goal: to return to work PT Goal Formulation: With patient Time For Goal Achievement: 02/11/24 Potential to Achieve Goals: Fair Progress towards PT goals: Progressing toward goals    Frequency    Min 2X/week      PT Plan  Continue with current POC        AM-PAC PT 6 Clicks Mobility   Outcome Measure  Help needed turning from your back to your side while in a flat bed without using bedrails?: A Little Help needed moving from lying on your back to sitting on the side of a flat bed without using bedrails?: A Little Help needed moving to and from a bed to a chair (including a wheelchair)?: A Little Help needed standing up from a chair using your arms (e.g., wheelchair or bedside chair)?: A Little Help needed to walk in hospital room?: A Little Help needed climbing 3-5 steps with a railing? : A Lot 6 Click Score: 17    End of Session Equipment Utilized During Treatment: Gait belt Activity Tolerance: Patient tolerated treatment well Patient left: with call bell/phone within reach;Other (comment) (on toilet with string in lap, RN notified.) Nurse Communication: Mobility status;Other (comment) (seated on toilet.) PT Visit Diagnosis: Other abnormalities of gait and mobility (R26.89);Muscle weakness (generalized) (M62.81);Difficulty in walking, not elsewhere classified (R26.2);Pain Pain - part of body:  (sternum)     Time: 1040-1103 PT Time  Calculation (min) (ACUTE ONLY): 23 min  Charges:    $Therapeutic Activity: 23-37 mins PT General Charges $$ ACUTE PT VISIT: 1 Visit                     Dorothyann Maier, DPT, CLT  Acute Rehabilitation Services Office: [phone] (Secure chat preferred)    Dorothyann VEAR Maier 01/30/2024, 11:27 AM

## 2024-01-30 NOTE — Progress Notes (Addendum)
   Inpatient Rehabilitation Admissions Coordinator   Dr Lorilee completed peer to peer with MD at [facility]. Based on clinicals and further discussions, patient felt too high functioning to need AIR level rehab., CGA with ADLS and mobilization. I met with patient at bedside and she is aware and I have left a voicemail with daughter, [name], to make her aware. Patient wishes to appeal that decision and I will initiate appeal today.   Heron Leavell, RN, MSN Rehab Admissions Coordinator [phone] 01/30/2024 12:22 PM  I spoke with daughter, [name], by phone and she is aware and in agreement to appeal.  Heron Leavell, RN, MSN Rehab Admissions Coordinator [phone] 01/30/2024 1:00 PM

## 2024-01-30 NOTE — Plan of Care (Signed)
  Problem: Clinical Measurements: Goal: Ability to maintain clinical measurements within normal limits will improve Outcome: Progressing Goal: Will remain free from infection Outcome: Progressing   Problem: Activity: Goal: Risk for activity intolerance will decrease Outcome: Progressing   Problem: Coping: Goal: Level of anxiety will decrease Outcome: Progressing   

## 2024-01-30 NOTE — Progress Notes (Signed)
 PHARMACY CONSULT NOTE FOR:  OUTPATIENT  PARENTERAL ANTIBIOTIC THERAPY (OPAT)  Indication: Candida endocarditis  Regimen: Fluconazole  400 mg IV Q 24 hours  End date: 02/21/24 then will need oral suppression   Notably if patient discharges from CIR , she can be switched to PO fluconazole . Please contact ID at time of discharge from CIR.   Thank you for allowing pharmacy to be a part of this patient's care.  Damien Quiet, PharmD, BCPS, BCIDP Infectious Diseases Clinical Pharmacist Phone: [phone] 01/30/2024, 9:38 AM

## 2024-01-30 NOTE — Progress Notes (Addendum)
 [address] [place] [employer] [address]             [phone]      8 Days Post-Op Procedure(s) (LRB): TRANSESOPHAGEAL ECHOCARDIOGRAM (N/A) CARDIOVERSION (N/A) Subjective: Patient states she felt like her appetite was back to normal yesterday, she was able to eat some breakfast, lunch and dinner.   Objective: Vital signs in last 24 hours: Temp:  [97.4 F (36.3 C)-98.2 F (36.8 C)] 97.4 F (36.3 C) (11/26 0332) Pulse Rate:  [81-105] 105 (11/26 0332) Resp:  [13-19] 18 (11/26 0332) BP: (102-136)/(56-75) 102/56 (11/26 0332) SpO2:  [91 %-96 %] 92 % (11/26 0332) Weight:  [63.3 kg] 63.3 kg (11/26 0332)  Hemodynamic parameters for last 24 hours:    Intake/Output from previous day: 11/25 0701 - 11/26 0700 In: 490 [P.O.:480; I.V.:10] Out: 350 [Urine:350] Intake/Output this shift: No intake/output data recorded.  General appearance: alert, cooperative, and no distress Neurologic: intact Heart: irregularly irregular rhythm Lungs: diminished bibasilar breath sounds Abdomen: soft, non-tender; bowel sounds normal; no masses,  no organomegaly Extremities: edema trace BLE Wound: Clean and dry without sign of infection  Lab Results: Recent Labs    01/29/24 0413 01/30/24 0346  WBC 9.6 8.2  HGB 9.1* 9.1*  HCT 28.6* 28.3*  PLT 167 178   BMET:  Recent Labs    01/29/24 0413 01/30/24 0346  NA 138 138  K 4.3 4.6  CL 94* 98  CO2 31 27  GLUCOSE 189* 130*  BUN 38* 35*  CREATININE 1.65* 1.56*  CALCIUM  8.9 9.3    PT/INR: No results for input(s): LABPROT, INR in the last 72 hours. ABG    Component Value Date/Time   PHART 7.387 01/11/2024 0856   HCO3 19.3 (L) 01/11/2024 0856   TCO2 20 (L) 01/11/2024 0856   ACIDBASEDEF 5.0 (H) 01/11/2024 0856   O2SAT 59.8 01/29/2024 0400   CBG (last 3)  Recent Labs    01/29/24 0614 01/29/24 1113 01/29/24 1647  GLUCAP 149* 152* 164*    Assessment/Plan: S/P Procedure(s) (LRB): TRANSESOPHAGEAL  ECHOCARDIOGRAM (N/A) CARDIOVERSION (N/A)  HENT: Recurrent epistaxis resolved, ENT has seen her. ASA has been d/c'd, on Eliquis . Dry mouth, magic mouthwash and biotene dry mouth rinse and lozenges started.    CV: Postop afib, afib with controlled HR 70s this AM. On Eliquis , Amiodarone  200mg  BID, and Digoxin  0.0625. Attempted DCCV on 11/18, aborted due to severe mitral regurgitation. She will need redo mitral valve replacement eventually. Surgeon's plan is for patient to go to rehab and go home. Will plan to bring back in about 3 weeks for redo surgery. AHF following, on Spironolactone  and hydralazine . BP stable, SBP 102-136.   Pulm: Saturating well on RA. Last CXR with small bilateral pleural effusions and bibasilar atelectasis. Encourage IS and ambulation.    GI: Severe malnutrition, poor PO intake. Patient reports her appetite was back to normal yesterday and she was able to eat all 3 meals. Continue magic mouthwash and biotene. Encourage increased PO intake. +BM yesterday   Renal: Cr 1.56, stable. UO 350cc/24hrs recorded. Under preop weight but +1lb from yesterday. Restart low dose Torsemide  today.    ID: Candidemia/fungal endocarditis, continue Fluconazole . No leukocytosis, afebrile.    Expected postop ABLA: H/H 9.1/28.6, stable. Not clinically significant at this time.    DVT Prophylaxis: No lovenox , on Eliquis     Deconditioning: Plan for CIR at discharge    Dispo: Will plan for CIR at discharge, insurance  authorization pending but CIR needs clarification on redo surgery timing prior to moving forward. As discussed with surgeon plan is for patient to go to CIR and go home following CIR. Will plan for redo surgery in about 3 weeks so she has time to recover from previous surgery.    LOS: 28 days    [name] Bend, PA-C 01/30/2024  Patient seen and examined, agree with above Ready for CIR from our standpoint  Elspeth C. Kerrin, MD [facility] [phone]

## 2024-01-30 NOTE — Progress Notes (Signed)
 Discussed with pt  IS, sternal precautions, smoking cessation and CRPII. Encouraged her to continue working hard. She was receptive to smoking cessation and resources given. Will not refer to CRPII yet due to need for redo MVR. Will continue to follow pt for ambulation progression.  [id number] Tracy Baker BS, ACSM-CEP 01/30/2024 2:58 PM

## 2024-01-30 NOTE — Progress Notes (Addendum)
 Patient ID: Tracy Baker, female   DOB: Mar 29, 1955, 68 y.o.   MRN: [id number]     Advanced Heart Failure Rounding Note  Cardiologist: Lonni Cash, MD  Chief Complaint: S/p MV Repair / MV endocarditis   Patient Profile   Tracy Baker is a 68 y.o. female with hx of recent nephrolithiasis s/p ureteral stent and lithotripsy at [facility] 10/10 with stent removal. 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 Post-op course c/b post cardiotomy shock, junctional bradycardia, afib w/ RVR and HAP.   Subjective:    POD #20 s/p MV repair for candidal MV endocarditis.   Co-ox pending  Dry mouth improving. Able to eat more yesterday.   Awaiting insurance approval for CIR.   Objective:   Weight Range: 63.3 kg Body mass index is 23.96 kg/m.   Vital Signs:   Temp:  [97.4 F (36.3 C)-98.7 F (37.1 C)] 98.7 F (37.1 C) (11/26 0759) Pulse Rate:  [81-106] 106 (11/26 0910) Resp:  [13-19] 14 (11/26 0759) BP: (102-136)/(56-75) 109/67 (11/26 0910) SpO2:  [91 %-96 %] 93 % (11/26 0759) Weight:  [63.3 kg] 63.3 kg (11/26 0332) Last BM Date : 01/28/24  Weight change: Filed Weights   01/28/24 0500 01/29/24 0348 01/30/24 0332  Weight: 63.4 kg 62.8 kg 63.3 kg    Intake/Output:   Intake/Output Summary (Last 24 hours) at 01/30/2024 1041 Last data filed at 01/30/2024 1017 Gross per 24 hour  Intake 370 ml  Output --  Net 370 ml    Physical Exam   Vitals:   01/30/24 0759 01/30/24 0910  BP: 109/67 109/67  Pulse: 90 (!) 106  Resp: 14   Temp: 98.7 F (37.1 C)   SpO2: 93%    General:  Sitting up in chair. Cor: No JVD. Irregular rhythm. No murmurs. Lungs: breathing nonlabored Extremities: trace edema Neuro: alert & orientedx3. Affect flat.   Telemetry   Afib with controlled rate  Labs    CBC Recent Labs    01/29/24 0413 01/30/24 0346  WBC 9.6 8.2  HGB 9.1* 9.1*  HCT 28.6* 28.3*  MCV 96.0 98.3  PLT 167 178   Basic Metabolic  Panel Recent Labs    01/29/24 0413 01/30/24 0346  NA 138 138  K 4.3 4.6  CL 94* 98  CO2 31 27  GLUCOSE 189* 130*  BUN 38* 35*  CREATININE 1.65* 1.56*  CALCIUM  8.9 9.3  MG 2.1 2.3   Liver Function Tests No results for input(s): AST, ALT, ALKPHOS, BILITOT, PROT, ALBUMIN  in the last 72 hours.  Medications:   Scheduled Medications:  amiodarone   200 mg Oral BID   apixaban   5 mg Oral BID   Chlorhexidine  Gluconate Cloth  6 each Topical Daily   digoxin   0.0625 mg Oral Daily   docusate sodium   200 mg Oral Daily   feeding supplement  237 mL Oral BID BM   hydrALAZINE   20 mg Oral TID   mirtazapine   15 mg Oral QHS   pantoprazole   40 mg Oral Daily   potassium chloride   40 mEq Oral Daily   sodium chloride  flush  10-40 mL Intracatheter Q12H   spironolactone   25 mg Oral Daily   thiamine   100 mg Oral Daily   torsemide   20 mg Oral Daily    Infusions:  fluconazole  (DIFLUCAN ) IV 400 mg (01/30/24 0923)    PRN Medications: bisacodyl  **OR** [DISCONTINUED] bisacodyl , clonazePAM , magic mouthwash, menthol , ondansetron  (ZOFRAN ) IV, mouth rinse, sodium chloride , sodium chloride  flush  Assessment/Plan   1. MV Endocarditis d/t Candida Albicans candidemia/fungemia, S/p MV repair w/ Post-cardiotomy Shock  - s/p MV repair 01/10/24 by Dr. Kerrin for Candida endocarditis.  - Pre-op L/RHC 01/04/24 with no CAD and normal L/R heart pressures.  - Intra-op TEE LVEF 60-65%, RV mildly reduced, small immobile mass on anterior valve leaflet, mod MR, trivial TR - Failed initial Milrinone  wean. Echo 11/13 per Dr. Rolan review: EF 60-65%, moderate RV dysfunction, s/p MV repair with probably moderate mitral regurgitation.  Milrinone  restarted.  -TEE done 11/18  LV EF 60-65%, mild RV dysfunction, severe mitral regurgitation of repaired valve.   - Co-ox 60% off milrinone .  - Volume status has been low. Diuretics held X 2 days, starting 20 mg Torsemide  daily today - Reduce digoxin  to 0.0625 mg  every other day d/t elevated level - Continue hydralazine  20 mg tid for afterload reduction - Continue spiro 25 mg daily - Discussed TEE results with structural heart service (Dr. Wonda), does not appear to be a good mTEER candidate.  - She is going to need eventual redo mitral valve surgery for mitral valve replacement, timing is uncertain.  Discussed with Dr. Kerrin, would like more time to recover from her recent MV repair (PT, nutrition). Planning CIR (if approved), then home and return for redo surgery in a few weeks.  2. Transient Junctional Nola  - Now in rate controlled AF - monitor w/ amiodarone    3. Atrial fibrillation, post-op - Remains in AF. Rate controlled  - Continue PO amio 200 mg bid  - Continue apixaban .  - Continue rate control for now as long as she tolerates. No change  4. ?HAP/aspiration  - treated w/ cefepime  + linezolid , completed 11/19.  - afebrile  5. 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   7. Elevated LFTs - Marked jump in LFTs 11/8. Now improved  8. Epistaxis - Recurrent. Now off aspirin , remains on eliquis . - Seen by ENT  9. Protein-calorie malnutrition - encourage po intake. Appetite starting to improve. Keep off scopolamine  patch which caused extreme dry mouth. - continue Ensure  Continue aggressive PT/OT. Awaiting insurance auth for CIR. Ready for CIR from HF standpoint.    FINCH, MANUELITA SAILOR, PA-C 01/30/2024 10:41 AM  Patient seen and examined with the above-signed Advanced Practice Provider and/or Housestaff. I personally reviewed laboratory data, imaging studies and relevant notes. I independently examined the patient and formulated the important aspects of the plan. I have edited the note to reflect any of my changes or salient points. I have personally discussed the plan with the patient and/or family.  Feeling better slowly. Denies SOB or  edema. Appetite improving  General:  Sitting up in chair. No resp difficulty HEENT: normal Neck: supple. no JVD.  Cor: Irregular rate & rhythm. 2/6 MR Lungs: clear Abdomen: soft, nontender, nondistended.Good bowel sounds. Extremities: no cyanosis, clubbing, rash, edema Neuro: alert & orientedx3, cranial nerves grossly intact. moves all 4 extremities w/o difficulty. Affect pleasant  Will restart torsemide  at lower dose 20 daily. Watch fluid status closely. Decrease digoxin  dose.   Ok for d/c to CIR from our standpoint. Will need eventual MVR.   We will see again on Friday.   Toribio Fuel, MD  12:01 PM

## 2024-01-30 NOTE — Plan of Care (Signed)
  Problem: Clinical Measurements: Goal: Diagnostic test results will improve Outcome: Progressing Goal: Respiratory complications will improve Outcome: Progressing Goal: Cardiovascular complication will be avoided Outcome: Progressing   Problem: Activity: Goal: Risk for activity intolerance will decrease Outcome: Progressing   Problem: Nutrition: Goal: Adequate nutrition will be maintained Outcome: Progressing   Problem: Coping: Goal: Level of anxiety will decrease Outcome: Progressing   Problem: Pain Managment: Goal: General experience of comfort will improve and/or be controlled Outcome: Progressing

## 2024-01-30 NOTE — Progress Notes (Addendum)
 Occupational Therapy Treatment Patient Details Name: Tracy Baker MRN: [id number] DOB: 1955-05-11 Today's Date: 01/30/2024   History of present illness 68 y.o. female presents to [facility] hospital on 12/29/2023 with after leaving AMA from [facility] [place] where she was being treated for C. Albicans candidemia. TEE on 10/28 positive for vegetation of mitral valve. Extraction of multiple teeth on 11/3. Mitral valve repair on 11/6. PMH includes thyroid  disease.   OT comments  Pt progressing toward goals, able to perform toilet transfer/pericare and standing grooming tasks, and LB ADL with CGA, cues for technique/compensatory strategy for LB ADLs. Pt verbally educated on technique for donning/doffing T shirt which she typically wears at home. Pt needs up to mod A for trunk elevation to come to EOB this session, but overall CGA for transfers with RW. Pt fatigues quickly, decr activity tolerance overall. Pt presenting with impairments listed below, will follow acutely. Patient will benefit from intensive inpatient follow-up therapy, >3 hours/day to maximize safety/ind with ADL/functional mobility.  107 HR SPO2 95% RA       If plan is discharge home, recommend the following:  A lot of help with bathing/dressing/bathroom;A lot of help with walking and/or transfers;Assist for transportation;Assistance with cooking/housework   Equipment Recommendations  Tub/shower seat    Recommendations for Other Services Rehab consult;PT consult    Precautions / Restrictions Precautions Precautions: Fall;Sternal Recall of Precautions/Restrictions: Intact       Mobility Bed Mobility Overal bed mobility: Needs Assistance Bed Mobility: Sidelying to Sit   Sidelying to sit: Mod assist       General bed mobility comments: mod A for trunk elevation    Transfers Overall transfer level: Needs assistance Equipment used: Rolling walker (2 wheels) Transfers: Sit to/from Stand, Bed to chair/wheelchair/BSC Sit to Stand:  Contact guard assist                 Balance Overall balance assessment: Needs assistance Sitting-balance support: No upper extremity supported, Feet supported Sitting balance-Leahy Scale: Fair     Standing balance support: Bilateral upper extremity supported, During functional activity, Reliant on assistive device for balance Standing balance-Leahy Scale: Fair Standing balance comment: no significant LOB                           ADL either performed or assessed with clinical judgement   ADL Overall ADL's : Needs assistance/impaired     Grooming: Wash/dry hands;Contact guard assist;Standing             Upper Body Dressing Details (indicate cue type and reason): verbally reviewed technique for UB dressing Lower Body Dressing: Contact guard assist Lower Body Dressing Details (indicate cue type and reason): donning/doffing R sock seated in chair Toilet Transfer: Contact guard assist;Rolling walker (2 wheels);Ambulation;Regular Toilet   Toileting- Clothing Manipulation and Hygiene: Contact guard assist       Functional mobility during ADLs: Contact guard assist;Rolling walker (2 wheels)      Extremity/Trunk Assessment Upper Extremity Assessment Upper Extremity Assessment: Generalized weakness   Lower Extremity Assessment Lower Extremity Assessment: Defer to PT evaluation        Vision   Vision Assessment?: No apparent visual deficits   Perception Perception Perception: Not tested   Praxis Praxis Praxis: Not tested   Communication Communication Communication: No apparent difficulties   Cognition Arousal: Alert Behavior During Therapy: Flat affect, WFL for tasks assessed/performed         Memory impairment (select all impairments): Short-term memory  OT - Cognition Comments: min cues not to push with BUE for sternal prec                 Following commands: Intact Following commands impaired: Only follows one step commands  consistently      Cueing   Cueing Techniques: Verbal cues, Gestural cues  Exercises      Shoulder Instructions       General Comments VSS on RA    Pertinent Vitals/ Pain       Pain Assessment Pain Assessment: No/denies pain  Home Living                                          Prior Functioning/Environment              Frequency  Min 2X/week        Progress Toward Goals  OT Goals(current goals can now be found in the care plan section)  Progress towards OT goals: Progressing toward goals  Acute Rehab OT Goals Patient Stated Goal: to eat bkfst OT Goal Formulation: With patient Time For Goal Achievement: 02/11/24 Potential to Achieve Goals: Good ADL Goals Pt Will Perform Grooming: with supervision;standing Pt Will Perform Upper Body Bathing: with supervision;sitting Pt Will Perform Lower Body Bathing: with min assist;sit to/from stand Pt Will Perform Upper Body Dressing: with supervision;sitting Pt Will Perform Lower Body Dressing: with supervision;sit to/from stand;sitting/lateral leans Pt Will Transfer to Toilet: with supervision;ambulating;regular height toilet Pt Will Perform Tub/Shower Transfer: Tub transfer;Shower transfer;with supervision;ambulating;rolling walker Additional ADL Goal #1: pt will tolerate OOB standing activity x15 min in order to improve activity tolerance for ADLs/IADLs  Plan      Co-evaluation                 AM-PAC OT 6 Clicks Daily Activity     Outcome Measure   Help from another person eating meals?: None Help from another person taking care of personal grooming?: A Little Help from another person toileting, which includes using toliet, bedpan, or urinal?: A Little Help from another person bathing (including washing, rinsing, drying)?: A Lot Help from another person to put on and taking off regular upper body clothing?: A Little Help from another person to put on and taking off regular lower body  clothing?: A Lot 6 Click Score: 17    End of Session Equipment Utilized During Treatment: Rolling walker (2 wheels)  OT Visit Diagnosis: Unsteadiness on feet (R26.81);Muscle weakness (generalized) (M62.81)   Activity Tolerance Patient tolerated treatment well   Patient Left in chair;with call bell/phone within reach;with chair alarm set   Nurse Communication Mobility status        Time: 9167-9146 OT Time Calculation (min): 21 min  Charges: OT General Charges $OT Visit: 1 Visit OT Treatments $Self Care/Home Management : 8-22 mins  Mariena Meares K, OTD, OTR/L SecureChat Preferred Acute Rehab [phone]   Kimiya Brunelle K Koonce 01/30/2024, 9:00 AM

## 2024-01-31 LAB — CBC
HCT: 29.6 % — ABNORMAL LOW (ref 36.0–46.0)
Hemoglobin: 9.4 g/dL — ABNORMAL LOW (ref 12.0–15.0)
MCH: 31.6 pg (ref 26.0–34.0)
MCHC: 31.8 g/dL (ref 30.0–36.0)
MCV: 99.7 fL (ref 80.0–100.0)
Platelets: 166 K/uL (ref 150–400)
RBC: 2.97 MIL/uL — ABNORMAL LOW (ref 3.87–5.11)
RDW: 23.6 % — ABNORMAL HIGH (ref 11.5–15.5)
WBC: 7.4 K/uL (ref 4.0–10.5)
nRBC: 0.8 % — ABNORMAL HIGH (ref 0.0–0.2)

## 2024-01-31 LAB — BASIC METABOLIC PANEL WITH GFR
Anion gap: 15 (ref 5–15)
BUN: 33 mg/dL — ABNORMAL HIGH (ref 8–23)
CO2: 29 mmol/L (ref 22–32)
Calcium: 9.3 mg/dL (ref 8.9–10.3)
Chloride: 96 mmol/L — ABNORMAL LOW (ref 98–111)
Creatinine, Ser: 1.72 mg/dL — ABNORMAL HIGH (ref 0.44–1.00)
GFR, Estimated: 32 mL/min — ABNORMAL LOW (ref >60)
Glucose, Bld: 147 mg/dL — ABNORMAL HIGH (ref 70–99)
Potassium: 3.9 mmol/L (ref 3.5–5.1)
Sodium: 140 mmol/L (ref 135–145)

## 2024-01-31 LAB — MAGNESIUM: Magnesium: 2 mg/dL (ref 1.7–2.4)

## 2024-01-31 LAB — COOXEMETRY PANEL
Carboxyhemoglobin: 2.6 % — ABNORMAL HIGH (ref 0.5–1.5)
Methemoglobin: <0.7 % (ref 0.0–1.5)
O2 Saturation: 62.3 %
Total hemoglobin: 9.8 g/dL — ABNORMAL LOW (ref 12.0–16.0)

## 2024-01-31 NOTE — Progress Notes (Signed)
 Mobility Specialist: Progress Note   01/31/24 1200  Mobility  Activity Ambulated with assistance  Level of Assistance Contact guard assist, steadying assist  Assistive Device Four wheel walker  Distance Ambulated (ft) 60 ft  RUE Weight Bearing Per Provider Order NWB  LUE Weight Bearing Per Provider Order NWB  Activity Response Tolerated well  Mobility Referral Yes  Mobility visit 1 Mobility  Mobility Specialist Start Time (ACUTE ONLY) 0840  Mobility Specialist Stop Time (ACUTE ONLY) 0850  Mobility Specialist Time Calculation (min) (ACUTE ONLY) 10 min    Pt received in bed, agreeable to mobility session. MinA for bed mobility to assist with trunk elevation. CGA throughout w/ roll. Peak HR 110 during session. C/o fatigue throughout, needing to take a seated break before returning back to room. Returned to bed. Left in bed talking to PA with all needs met, call bell in reach.   Ileana Lute Mobility Specialist Please contact via SecureChat or Rehab office at [phone]

## 2024-01-31 NOTE — Progress Notes (Addendum)
      [address] [employer] [id number]             [phone]         9 Days Post-Op Procedure(s) (LRB): TRANSESOPHAGEAL ECHOCARDIOGRAM (N/A) CARDIOVERSION (N/A)  Subjective:  Just got back from walk.  She complains of fatigue and shortness of breath  Objective: Vital signs in last 24 hours: Temp:  [97.6 F (36.4 C)-98 F (36.7 C)] 97.8 F (36.6 C) (11/27 0518) Pulse Rate:  [82-108] 93 (11/27 0518) Cardiac Rhythm: Atrial fibrillation (11/27 0700) Resp:  [12-20] 12 (11/27 0518) BP: (109-130)/(64-81) 112/81 (11/27 0518) SpO2:  [93 %-96 %] 95 % (11/27 0518) Weight:  [62.1 kg] 62.1 kg (11/27 0518)  Intake/Output from previous day: 11/26 0701 - 11/27 0700 In: 1000 [P.O.:600; IV Piggyback:400] Out: -   General appearance: alert, cooperative, and no distress Heart: regular rate and rhythm Lungs: clear to auscultation bilaterally Abdomen: soft, non-tender; bowel sounds normal; no masses,  no organomegaly Extremities: edema trace Wound: clean and dry  Lab Results: Recent Labs    01/30/24 0346 01/31/24 0603  WBC 8.2 7.4  HGB 9.1* 9.4*  HCT 28.3* 29.6*  PLT 178 166   BMET:  Recent Labs    01/30/24 0346 01/31/24 0603  NA 138 140  K 4.6 3.9  CL 98 96*  CO2 27 29  GLUCOSE 130* 147*  BUN 35* 33*  CREATININE 1.56* 1.72*  CALCIUM  9.3 9.3    PT/INR: No results for input(s): LABPROT, INR in the last 72 hours. ABG    Component Value Date/Time   PHART 7.387 01/11/2024 0856   HCO3 19.3 (L) 01/11/2024 0856   TCO2 20 (L) 01/11/2024 0856   ACIDBASEDEF 5.0 (H) 01/11/2024 0856   O2SAT 62.3 01/31/2024 0555   CBG (last 3)  Recent Labs    01/29/24 0614 01/29/24 1113 01/29/24 1647  GLUCAP 149* 152* 164*    Assessment/Plan: S/P Procedure(s) (LRB): TRANSESOPHAGEAL ECHOCARDIOGRAM (N/A) CARDIOVERSION (N/A)  CV- PAF, remains rate controlled- continue Eliquis , Amiodarone , Digoxin .SABRASABRACo-ox 62 AHF following Severe MR- S/P MV Repair, will  require replacement in future weeks Pulm- off oxygen continue IS Renal- creatinine at 1.72, weight is significantly less than baseline.. will defer diuretic management to AHF team GI- oral intake remains poor, tolerating as able.. continue to encourage oral intake Fungal Endocarditis- per ID Deconditioning- severe, very weak due to heart failure and recent surgery.SABRA would greatly benefit from CIR, however patient's insurance declined.SABRA appeal is in process   LOS: 29 days    Rocky Shad, PA-C 01/31/2024 8:59 AM  Walked out in the hall today. Cre slightly up today, appears euvolemic.  Consider decreasing diuresis.  Awaiting insurance appeal for CIR.  Con Clunes, MD Cardiothoracic Surgery Pager: [phone]

## 2024-01-31 NOTE — Plan of Care (Signed)
  Problem: Clinical Measurements: Goal: Will remain free from infection Outcome: Progressing Goal: Diagnostic test results will improve Outcome: Progressing Goal: Respiratory complications will improve Outcome: Progressing Goal: Cardiovascular complication will be avoided Outcome: Progressing   Problem: Activity: Goal: Risk for activity intolerance will decrease Outcome: Progressing   Problem: Coping: Goal: Level of anxiety will decrease Outcome: Progressing   Problem: Pain Managment: Goal: General experience of comfort will improve and/or be controlled Outcome: Progressing

## 2024-02-01 DIAGNOSIS — B377 Candidal sepsis: Secondary | ICD-10-CM | POA: Diagnosis not present

## 2024-02-01 LAB — CBC
HCT: 29.6 % — ABNORMAL LOW (ref 36.0–46.0)
Hemoglobin: 9.3 g/dL — ABNORMAL LOW (ref 12.0–15.0)
MCH: 31.4 pg (ref 26.0–34.0)
MCHC: 31.4 g/dL (ref 30.0–36.0)
MCV: 100 fL (ref 80.0–100.0)
Platelets: 170 K/uL (ref 150–400)
RBC: 2.96 MIL/uL — ABNORMAL LOW (ref 3.87–5.11)
RDW: 24.7 % — ABNORMAL HIGH (ref 11.5–15.5)
WBC: 6.7 K/uL (ref 4.0–10.5)
nRBC: 0.4 % — ABNORMAL HIGH (ref 0.0–0.2)

## 2024-02-01 LAB — COOXEMETRY PANEL
Carboxyhemoglobin: 2.7 % — ABNORMAL HIGH (ref 0.5–1.5)
Methemoglobin: <0.7 % (ref 0.0–1.5)
O2 Saturation: 62.2 %
Total hemoglobin: 9.8 g/dL — ABNORMAL LOW (ref 12.0–16.0)

## 2024-02-01 LAB — MAGNESIUM: Magnesium: 2 mg/dL (ref 1.7–2.4)

## 2024-02-01 LAB — BASIC METABOLIC PANEL WITH GFR
Anion gap: 16 — ABNORMAL HIGH (ref 5–15)
BUN: 33 mg/dL — ABNORMAL HIGH (ref 8–23)
CO2: 29 mmol/L (ref 22–32)
Calcium: 9.1 mg/dL (ref 8.9–10.3)
Chloride: 97 mmol/L — ABNORMAL LOW (ref 98–111)
Creatinine, Ser: 1.86 mg/dL — ABNORMAL HIGH (ref 0.44–1.00)
GFR, Estimated: 29 mL/min — ABNORMAL LOW (ref >60)
Glucose, Bld: 115 mg/dL — ABNORMAL HIGH (ref 70–99)
Potassium: 3.9 mmol/L (ref 3.5–5.1)
Sodium: 142 mmol/L (ref 135–145)

## 2024-02-01 NOTE — Plan of Care (Signed)

## 2024-02-01 NOTE — Progress Notes (Signed)
 Physical Therapy Treatment Patient Details Name: Tracy Baker MRN: [id number] DOB: 06-Feb-1956 Today's Date: 02/01/2024   History of Present Illness 68 y.o. female adm 12/29/2023 after leaving AMA from [facility] (where she was on vacation due to lack of formal transfer) where she was being treated for C. Albicans candidemia. 10/28 TEE with mitral valve vegetation. 11/3 teeth Extractions. 11/6 Mitral valve repair. PMHx: thyroid  disease.    PT Comments  Pt with flat affect able to state 2/4 precautions with education for all. Pt able to slowly improve gait distance but fatigues quickly requiring grossly 3 min seated rest prior to further activity. Pt educated for HEP, transfers and progression. Patient will benefit from intensive inpatient follow-up therapy, >3 hours/day   HR 107 SPO2 96% on RA BP 125/79    If plan is discharge home, recommend the following: Assistance with cooking/housework;Assist for transportation;Help with stairs or ramp for entrance;A little help with walking and/or transfers;A little help with bathing/dressing/bathroom   Can travel by private Psychologist, Clinical (4 wheels);BSC/3in1    Recommendations for Other Services       Precautions / Restrictions Precautions Precautions: Fall;Sternal Recall of Precautions/Restrictions: Impaired Precaution/Restrictions Comments: Reviewed precautions.     Mobility  Bed Mobility Overal bed mobility: Needs Assistance Bed Mobility: Rolling, Sidelying to Sit Rolling: Supervision Sidelying to sit: Min assist       General bed mobility comments: cues for sequence and precautions with assist to elevate trunk from surface    Transfers Overall transfer level: Needs assistance   Transfers: Sit to/from Stand Sit to Stand: Contact guard assist, Min assist           General transfer comment: initial rise from bed with min assist, cues for hand placement. Pt performed additional 4 trials  from chair with CGA and hands on thighs    Ambulation/Gait Ambulation/Gait assistance: Contact guard assist Gait Distance (Feet): 80 Feet Assistive device: Rolling walker (2 wheels) Gait Pattern/deviations: Step-through pattern, Decreased stride length, Trunk flexed   Gait velocity interpretation: 1.31 - 2.62 ft/sec, indicative of limited community ambulator   General Gait Details: cues for proximity to RW and safety. pt fatigued after 80' needing seated rest prior to additional 66' with VSS   Stairs             Wheelchair Mobility     Tilt Bed    Modified Rankin (Stroke Patients Only)       Balance Overall balance assessment: Needs assistance Sitting-balance support: No upper extremity supported, Feet supported Sitting balance-Leahy Scale: Fair     Standing balance support: Bilateral upper extremity supported, During functional activity, Reliant on assistive device for balance Standing balance-Leahy Scale: Poor Standing balance comment: Rw in standing                            Communication Communication Communication: No apparent difficulties  Cognition Arousal: Alert Behavior During Therapy: Flat affect   PT - Cognitive impairments: Safety/Judgement, Memory                       PT - Cognition Comments: decreased recall of precautions with education for all Following commands: Intact      Cueing Cueing Techniques: Verbal cues  Exercises General Exercises - Lower Extremity Long Arc Quad: Strengthening, Both, Seated, 20 reps Hip Flexion/Marching: AROM, Both, 20 reps, Seated, Strengthening    General Comments  Pertinent Vitals/Pain Pain Assessment Pain Assessment: No/denies pain    Home Living                          Prior Function            PT Goals (current goals can now be found in the care plan section) Progress towards PT goals: Progressing toward goals    Frequency    Min 2X/week       PT Plan      Co-evaluation              AM-PAC PT 6 Clicks Mobility   Outcome Measure  Help needed turning from your back to your side while in a flat bed without using bedrails?: A Little Help needed moving from lying on your back to sitting on the side of a flat bed without using bedrails?: A Little Help needed moving to and from a bed to a chair (including a wheelchair)?: A Little Help needed standing up from a chair using your arms (e.g., wheelchair or bedside chair)?: A Little Help needed to walk in hospital room?: A Little Help needed climbing 3-5 steps with a railing? : A Lot 6 Click Score: 17    End of Session   Activity Tolerance: Patient tolerated treatment well Patient left: in chair;with call bell/phone within reach;with chair alarm set Nurse Communication: Mobility status PT Visit Diagnosis: Other abnormalities of gait and mobility (R26.89);Muscle weakness (generalized) (M62.81);Difficulty in walking, not elsewhere classified (R26.2)     Time: 9163-9143 PT Time Calculation (min) (ACUTE ONLY): 20 min  Charges:    $Gait Training: 8-22 mins PT General Charges $$ ACUTE PT VISIT: 1 Visit                     Lenoard SQUIBB, PT Acute Rehabilitation Services Office: [phone]    Loye Reininger B Eryx Zane 02/01/2024, 10:14 AM

## 2024-02-01 NOTE — Progress Notes (Signed)
 Mobility Specialist: Progress Note   02/01/24 1543  Mobility  Activity Ambulated with assistance  Level of Assistance Standby assist, set-up cues, supervision of patient - no hands on  Assistive Device Front wheel walker  Distance Ambulated (ft) 20 ft  RUE Weight Bearing Per Provider Order NWB  LUE Weight Bearing Per Provider Order NWB  Activity Response Tolerated well  Mobility Referral Yes  Mobility visit 1 Mobility  Mobility Specialist Start Time (ACUTE ONLY) 1352  Mobility Specialist Stop Time (ACUTE ONLY) 1402  Mobility Specialist Time Calculation (min) (ACUTE ONLY) 10 min    Pt received in bed. MinA for bed mobility to assist with trunk elevation. SV for STS and ambulation. Requested to use the BR. Void successful and pericare done ind. Declined further ambulation. Returned to bed. No complaints. Peak HR 121. Sv for sit>supine. Left in bed with all needs met, call bell in reach.   Ileana Lute Mobility Specialist Please contact via SecureChat or Rehab office at [phone]

## 2024-02-01 NOTE — Progress Notes (Signed)
   Inpatient Rehabilitation Admissions Coordinator   I await appeal determination. I contacted appeals this morning and the decision is in process. Determination to be made by 11/29 per representative.  Heron Leavell, RN, MSN Rehab Admissions Coordinator [phone] 02/01/2024 8:17 AM

## 2024-02-01 NOTE — TOC Progression Note (Signed)
 Transition of Care [facility]) - Progression Note    Patient Details  Name: Tracy Baker MRN: [id number] Date of Birth: Sep 19, 1955  [facility]) CM/SW Contact  Carmelita FORBES Carbon, LCSW Phone Number: 02/01/2024, 9:07 AM  Clinical Narrative:    ICM continues to follow for CIR appeal outcome.   Expected Discharge Plan: IP Rehab Facility Barriers to Discharge: Continued Medical Work up               Expected Discharge Plan and Services   Discharge Planning Services: CM Consult Post Acute Care Choice: Skilled Nursing Facility Living arrangements for the past 2 months: Apartment                                       Social Drivers of Health (SDOH) Interventions SDOH Screenings   Food Insecurity: No Food Insecurity (12/29/2023)  Housing: Low Risk  (12/29/2023)  Transportation Needs: No Transportation Needs (12/29/2023)  Utilities: Not At Risk (12/29/2023)  Alcohol Screen: Low Risk  (06/08/2022)  Depression (PHQ2-9): Low Risk  (11/08/2023)  Social Connections: Socially Isolated (12/29/2023)  Tobacco Use: High Risk (01/10/2024)    Readmission Risk Interventions     No data to display

## 2024-02-01 NOTE — Progress Notes (Signed)
 Patient ID: Tracy Baker, female   DOB: Oct 02, 1955, 68 y.o.   MRN: [id number]     Advanced Heart Failure Rounding Note  Cardiologist: Lonni Cash, MD  Chief Complaint: S/p MV Repair / MV endocarditis   Patient Profile   Tracy Baker is a 68 y.o. female with hx of recent nephrolithiasis s/p ureteral stent and lithotripsy at [facility] 10/10 with stent removal. 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 Post-op course c/b post cardiotomy shock, junctional bradycardia, afib w/ RVR and HAP.   Subjective:    Feels ok. Mouth is dry. Denies SOB.  Torsemide  held yesterday for slight rise in Scr   Awaiting insurance approval for CIR.   Objective:   Weight Range: 61.2 kg Body mass index is 23.16 kg/m.   Vital Signs:   Temp:  [97.5 F (36.4 C)-98.3 F (36.8 C)] 97.5 F (36.4 C) (11/28 1109) Pulse Rate:  [73-107] 73 (11/28 1109) Resp:  [12-20] 20 (11/28 1109) BP: (104-132)/(53-91) 104/53 (11/28 1109) SpO2:  [93 %-96 %] 96 % (11/28 1109) Weight:  [61.2 kg] 61.2 kg (11/28 0532) Last BM Date : 01/29/24  Weight change: Filed Weights   01/30/24 0332 01/31/24 0518 02/01/24 0532  Weight: 63.3 kg 62.1 kg 61.2 kg    Intake/Output:   Intake/Output Summary (Last 24 hours) at 02/01/2024 1122 Last data filed at 02/01/2024 0943 Gross per 24 hour  Intake 800 ml  Output --  Net 800 ml    Physical Exam   Vitals:   02/01/24 1102 02/01/24 1109  BP: 114/81 (!) 104/53  Pulse:  73  Resp:  20  Temp:  (!) 97.5 F (36.4 C)  SpO2:  96%   General:  Sitting up in bed. No resp difficulty HEENT: normal Neck: supple. no JVD.  Cor: Regular rate & rhythm. 2/6 MR Lungs: clear Abdomen: soft, nontender, nondistended.Good bowel sounds. Extremities: no cyanosis, clubbing, rash, edema Neuro: alert & orientedx3, cranial nerves grossly intact. moves all 4 extremities w/o difficulty. Affect pleasant   Telemetry   AF 70s Personally reviewed  Labs     CBC Recent Labs    01/31/24 0603 02/01/24 0542  WBC 7.4 6.7  HGB 9.4* 9.3*  HCT 29.6* 29.6*  MCV 99.7 100.0  PLT 166 170   Basic Metabolic Panel Recent Labs    88/72/74 0603 02/01/24 0542  NA 140 142  K 3.9 3.9  CL 96* 97*  CO2 29 29  GLUCOSE 147* 115*  BUN 33* 33*  CREATININE 1.72* 1.86*  CALCIUM  9.3 9.1  MG 2.0 2.0   Liver Function Tests No results for input(s): AST, ALT, ALKPHOS, BILITOT, PROT, ALBUMIN  in the last 72 hours.  Medications:   Scheduled Medications:  amiodarone   200 mg Oral BID   apixaban   5 mg Oral BID   Chlorhexidine  Gluconate Cloth  6 each Topical Daily   digoxin   0.0625 mg Oral QODAY   docusate sodium   200 mg Oral Daily   feeding supplement  237 mL Oral BID BM   hydrALAZINE   20 mg Oral TID   mirtazapine   15 mg Oral QHS   pantoprazole   40 mg Oral Daily   potassium chloride   40 mEq Oral Daily   sodium chloride  flush  10-40 mL Intracatheter Q12H   thiamine   100 mg Oral Daily    Infusions:  fluconazole  (DIFLUCAN ) IV 400 mg (02/01/24 1113)    PRN Medications: bisacodyl  **OR** [DISCONTINUED] bisacodyl , clonazePAM , magic mouthwash, menthol , ondansetron  (ZOFRAN ) IV, mouth rinse,  sodium chloride , sodium chloride  flush   Assessment/Plan   1. MV Endocarditis d/t Candida Albicans candidemia/fungemia, S/p MV repair w/ Post-cardiotomy Shock  - s/p MV repair 01/10/24 by Dr. Kerrin for Candida endocarditis.  - Pre-op L/RHC 01/04/24 with no CAD and normal L/R heart pressures.  - Intra-op TEE LVEF 60-65%, RV mildly reduced, small immobile mass on anterior valve leaflet, mod MR, trivial TR - Failed initial Milrinone  wean. Echo 11/13 per Dr. Rolan review: EF 60-65%, moderate RV dysfunction, s/p MV repair with probably moderate mitral regurgitation.  Milrinone  restarted.  -TEE done 11/18  LV EF 60-65%, mild RV dysfunction, severe mitral regurgitation of repaired valve.   - Co-ox 62% off milrinone .  - Volume status remains low. Will hold  torsemide  and spiro today with bump in Scr. That said, will need to make sure we keep her dry given severe MR - Continue digoxin  0.0625 mg every other day d/t elevated level - Continue hydralazine  20 mg tid for afterload reduction - Continue spiro 25 mg daily -> hold today - Discussed TEE results with structural heart service (Dr. Wonda), does not appear to be a good mTEER candidate.  - She is going to need eventual redo mitral valve surgery for mitral valve replacement, timing is uncertain.  Discussed with Dr. Kerrin, would like more time to recover from her recent MV repair (PT, nutrition). Planning CIR (if approved), then home and return for redo surgery in a few weeks.  2. Transient Junctional Nola  - Now in rate controlled AF - monitor w/ amiodarone   - stable  3. Atrial fibrillation, post-op - Remains in AF. Rate controlled  - Continue PO amio 200 mg bid  - Continue apixaban .  - Continue rate control for now as long as she tolerates.  - no change  4. AKI on CKD 3b - Scr up slightly but not too far from baseline (1.5-1.6) - hold torsemide  and spiro - follow   5. Protein-calorie malnutrition - encourage po intake. Appetite starting to improve. Keep off scopolamine  patch which caused extreme dry mouth. - continue Ensure  Continue aggressive PT/OT. Awaiting insurance auth for CIR. Ready for CIR from HF standpoint.     Toribio Fuel, MD  11:22 AM

## 2024-02-01 NOTE — Progress Notes (Addendum)
 10 Days Post-Op Procedure(s) (LRB): TRANSESOPHAGEAL ECHOCARDIOGRAM (N/A) CARDIOVERSION (N/A) Subjective: Feels ok, dry mouth is affecting her ability to eat  Objective: Vital signs in last 24 hours: Temp:  [97.7 F (36.5 C)-98.3 F (36.8 C)] 98.2 F (36.8 C) (11/28 0532) Pulse Rate:  [93-107] 101 (11/28 0532) Cardiac Rhythm: Atrial fibrillation;Bundle branch block (11/28 0700) Resp:  [13-17] 13 (11/28 0532) BP: (105-132)/(66-91) 105/66 (11/28 0532) SpO2:  [93 %-95 %] 94 % (11/28 0532) Weight:  [61.2 kg] 61.2 kg (11/28 0532)  Hemodynamic parameters for last 24 hours:    Intake/Output from previous day: 11/27 0701 - 11/28 0700 In: 440 [P.O.:240; IV Piggyback:200] Out: -  Intake/Output this shift: No intake/output data recorded.  General appearance: alert, cooperative, and no distress Heart: slightly irreg, _ 2/6 mitral murmur Lungs: clear Abdomen: benign Extremities: no edema Wound: incis healing well  Lab Results: Recent Labs    01/31/24 0603 02/01/24 0542  WBC 7.4 6.7  HGB 9.4* 9.3*  HCT 29.6* 29.6*  PLT 166 170   BMET:  Recent Labs    01/31/24 0603 02/01/24 0542  NA 140 142  K 3.9 3.9  CL 96* 97*  CO2 29 29  GLUCOSE 147* 115*  BUN 33* 33*  CREATININE 1.72* 1.86*  CALCIUM  9.3 9.1    PT/INR: No results for input(s): LABPROT, INR in the last 72 hours. ABG    Component Value Date/Time   PHART 7.387 01/11/2024 0856   HCO3 19.3 (L) 01/11/2024 0856   TCO2 20 (L) 01/11/2024 0856   ACIDBASEDEF 5.0 (H) 01/11/2024 0856   O2SAT 62.2 02/01/2024 0536   CBG (last 3)  Recent Labs    01/29/24 1113 01/29/24 1647  GLUCAP 152* 164*    Meds Scheduled Meds:  amiodarone   200 mg Oral BID   apixaban   5 mg Oral BID   Chlorhexidine  Gluconate Cloth  6 each Topical Daily   digoxin   0.0625 mg Oral QODAY   docusate sodium   200 mg Oral Daily   feeding supplement  237 mL Oral BID BM   hydrALAZINE   20 mg Oral TID   mirtazapine   15 mg Oral QHS   pantoprazole    40 mg Oral Daily   potassium chloride   40 mEq Oral Daily   sodium chloride  flush  10-40 mL Intracatheter Q12H   spironolactone   25 mg Oral Daily   thiamine   100 mg Oral Daily   Continuous Infusions:  fluconazole  (DIFLUCAN ) IV Stopped (01/31/24 1152)   PRN Meds:.bisacodyl  **OR** [DISCONTINUED] bisacodyl , clonazePAM , magic mouthwash, menthol , ondansetron  (ZOFRAN ) IV, mouth rinse, sodium chloride , sodium chloride  flush  Xrays No results found.  Assessment/Plan: S/P Procedure(s) (LRB): TRANSESOPHAGEAL ECHOCARDIOGRAM (N/A) CARDIOVERSION (N/A)   1 afeb, s BP 90's-100's, afib- rate controlled- on Eliquis  Amiodarone  and digoxin - AHF team assisting w/ GDMT/afib/diuretic management 2 O2 sats good on RA 3 voiding- Amts not measured 4 creat rising to 1.86 today, BUN stable at 33- on spiroo 5 H/H stable, no leukocytosis 6 awaits CIR approval- needs prior to MV replacement 7 ID-  fluconazole  long term for fungal endocarditis   LOS: 30 days    Lemond FORBES Cera PA-C Pager [phone] 02/01/2024  Looks good this morning.  Sitting in a chair eating breakfast. Walks the halls with PT earlier today.  Continues to complain of dry mouth/lips that is making it hard to take PO.  Volume status looks good.  Awaiting CIR placement  Con Clunes, MD Cardiothoracic Surgery Pager: [phone]

## 2024-02-02 LAB — CBC
HCT: 29.4 % — ABNORMAL LOW (ref 36.0–46.0)
Hemoglobin: 9.2 g/dL — ABNORMAL LOW (ref 12.0–15.0)
MCH: 31.7 pg (ref 26.0–34.0)
MCHC: 31.3 g/dL (ref 30.0–36.0)
MCV: 101.4 fL — ABNORMAL HIGH (ref 80.0–100.0)
Platelets: 179 K/uL (ref 150–400)
RBC: 2.9 MIL/uL — ABNORMAL LOW (ref 3.87–5.11)
RDW: 25.2 % — ABNORMAL HIGH (ref 11.5–15.5)
WBC: 7.9 K/uL (ref 4.0–10.5)
nRBC: 0.3 % — ABNORMAL HIGH (ref 0.0–0.2)

## 2024-02-02 LAB — BASIC METABOLIC PANEL WITH GFR
Anion gap: 9 (ref 5–15)
BUN: 32 mg/dL — ABNORMAL HIGH (ref 8–23)
CO2: 30 mmol/L (ref 22–32)
Calcium: 9 mg/dL (ref 8.9–10.3)
Chloride: 98 mmol/L (ref 98–111)
Creatinine, Ser: 1.62 mg/dL — ABNORMAL HIGH (ref 0.44–1.00)
GFR, Estimated: 34 mL/min — ABNORMAL LOW (ref >60)
Glucose, Bld: 111 mg/dL — ABNORMAL HIGH (ref 70–99)
Potassium: 3.7 mmol/L (ref 3.5–5.1)
Sodium: 137 mmol/L (ref 135–145)

## 2024-02-02 LAB — MAGNESIUM: Magnesium: 2 mg/dL (ref 1.7–2.4)

## 2024-02-02 NOTE — Progress Notes (Signed)
 Inpatient Rehab Admissions Coordinator:  Notified by [employer] that the appeal has been denied. Pt made aware. TOC made aware.  Tinnie Yvone Cohens, MS, CCC-SLP Admissions Coordinator [phone]

## 2024-02-02 NOTE — Plan of Care (Signed)
   Problem: Education: Goal: Knowledge of General Education information will improve Description: Including pain rating scale, medication(s)/side effects and non-pharmacologic comfort measures Outcome: Progressing   Problem: Health Behavior/Discharge Planning: Goal: Ability to manage health-related needs will improve Outcome: Progressing   Problem: Activity: Goal: Risk for activity intolerance will decrease Outcome: Progressing

## 2024-02-02 NOTE — Progress Notes (Addendum)
      [address] [employer] [address]             [phone]         11 Days Post-Op Procedure(s) (LRB): TRANSESOPHAGEAL ECHOCARDIOGRAM (N/A) CARDIOVERSION (N/A)  Subjective:  Patient sitting up in bed.  Overall doing well she has no new complaints.  States she slept well last night.  CIR insurance authorization remains pending  Objective: Vital signs in last 24 hours: Temp:  [97.7 F (36.5 C)-98.6 F (37 C)] 97.8 F (36.6 C) (11/29 0833) Pulse Rate:  [90-107] 100 (11/29 0833) Cardiac Rhythm: Atrial flutter;Bundle branch block (11/28 1929) Resp:  [14-20] 16 (11/29 0833) BP: (108-135)/(68-89) 123/69 (11/29 0833) SpO2:  [94 %-96 %] 94 % (11/29 0833) Weight:  [58.7 kg] 58.7 kg (11/29 0503)  Intake/Output from previous day: 11/28 0701 - 11/29 0700 In: 1040 [P.O.:840; IV Piggyback:200] Out: -   General appearance: alert, cooperative, and no distress Heart: regular rate and rhythm Lungs: clear to auscultation bilaterally Abdomen: soft, non-tender; bowel sounds normal; no masses,  no organomegaly Wound: clean and dry  Lab Results: Recent Labs    02/01/24 0542 02/02/24 0500  WBC 6.7 7.9  HGB 9.3* 9.2*  HCT 29.6* 29.4*  PLT 170 179   BMET:  Recent Labs    02/01/24 0542 02/02/24 0500  NA 142 137  K 3.9 3.7  CL 97* 98  CO2 29 30  GLUCOSE 115* 111*  BUN 33* 32*  CREATININE 1.86* 1.62*  CALCIUM  9.1 9.0    PT/INR: No results for input(s): LABPROT, INR in the last 72 hours. ABG    Component Value Date/Time   PHART 7.387 01/11/2024 0856   HCO3 19.3 (L) 01/11/2024 0856   TCO2 20 (L) 01/11/2024 0856   ACIDBASEDEF 5.0 (H) 01/11/2024 0856   O2SAT 62.2 02/01/2024 0536   CBG (last 3)  No results for input(s): GLUCAP in the last 72 hours.  Assessment/Plan: S/P Procedure(s) (LRB): TRANSESOPHAGEAL ECHOCARDIOGRAM (N/A) CARDIOVERSION (N/A)  CV- A. Fibrillation rate controlled- continue Eliquis , Amiodarone  and Digoxin .. AHF team  managing.SABRA Co-ox remains stable Renal-1.62, diuresing per AHF ID- fungal endocarditis.SABRA continue ABX Deconditioning- needs CIR per PT/OT recs.. insurance appeal in process.. supposed to receive answer today Continue current care, hopefully can receive authorization for CIR, if not will have evaluate SNF placement   LOS: 31 days    Rocky Shad, PA-C 02/02/2024 9:08 AM  Remains hemodynamically stable.  Main issue is still the dry mouth.  [place] it was improved yesterday but bad again today.  Magic mouthwash is helping.  Awaiting CIR authorization.  Con Clunes, MD Cardiothoracic Surgery Pager: [phone]

## 2024-02-02 NOTE — TOC Progression Note (Signed)
 Transition of Care [facility]) - Progression Note    Patient Details  Name: Tracy Baker MRN: [id number] Date of Birth: 11-22-1955  [facility]) CM/SW Contact  Isaiah Public, LCSWA Phone Number: 02/02/2024, 9:29 AM  Clinical Narrative:     Tinnie with CIR informed CSW that patients appeal was denied.  Expected Discharge Plan: IP Rehab Facility Barriers to Discharge: Continued Medical Work up               Expected Discharge Plan and Services   Discharge Planning Services: CM Consult Post Acute Care Choice: Skilled Nursing Facility Living arrangements for the past 2 months: Apartment                                       Social Drivers of Health (SDOH) Interventions SDOH Screenings   Food Insecurity: No Food Insecurity (12/29/2023)  Housing: Low Risk  (12/29/2023)  Transportation Needs: No Transportation Needs (12/29/2023)  Utilities: Not At Risk (12/29/2023)  Alcohol Screen: Low Risk  (06/08/2022)  Depression (PHQ2-9): Low Risk  (11/08/2023)  Social Connections: Socially Isolated (12/29/2023)  Tobacco Use: High Risk (01/10/2024)    Readmission Risk Interventions     No data to display

## 2024-02-02 NOTE — Progress Notes (Signed)
 Mobility Specialist Progress Note:   02/02/24 1142  Mobility  Activity Ambulated with assistance  Level of Assistance Standby assist, set-up cues, supervision of patient - no hands on  Assistive Device Front wheel walker  Distance Ambulated (ft) 84 ft  Activity Response Tolerated well  Mobility Referral Yes  Mobility visit 1 Mobility  Mobility Specialist Start Time (ACUTE ONLY) 0955  Mobility Specialist Stop Time (ACUTE ONLY) 1008  Mobility Specialist Time Calculation (min) (ACUTE ONLY) 13 min   Pt received in bed agreeable to mobility. No physical assistance required. Able to maintain sternal precautions. Distance limited d/t fatigue, otherwise tolerated well. Returned to room w/o fault. Left in bed w/ call bell and personal belongings in reach. All needs met.   Thersia Minder Mobility Specialist  Please contact vis Secure Chat or  Rehab Office [phone]

## 2024-02-02 NOTE — Plan of Care (Signed)
  Problem: Education: Goal: Knowledge of General Education information will improve Description: Including pain rating scale, medication(s)/side effects and non-pharmacologic comfort measures Outcome: Progressing   Problem: Health Behavior/Discharge Planning: Goal: Ability to manage health-related needs will improve Outcome: Progressing   Problem: Clinical Measurements: Goal: Ability to maintain clinical measurements within normal limits will improve Outcome: Progressing Goal: Will remain free from infection Outcome: Progressing Goal: Diagnostic test results will improve Outcome: Progressing Goal: Respiratory complications will improve Outcome: Progressing Goal: Cardiovascular complication will be avoided Outcome: Progressing   Problem: Activity: Goal: Risk for activity intolerance will decrease Outcome: Progressing   Problem: Nutrition: Goal: Adequate nutrition will be maintained Outcome: Progressing   Problem: Coping: Goal: Level of anxiety will decrease Outcome: Progressing   Problem: Elimination: Goal: Will not experience complications related to bowel motility Outcome: Progressing Goal: Will not experience complications related to urinary retention Outcome: Progressing   Problem: Pain Managment: Goal: General experience of comfort will improve and/or be controlled Outcome: Progressing   Problem: Safety: Goal: Ability to remain free from injury will improve Outcome: Progressing   Problem: Skin Integrity: Goal: Risk for impaired skin integrity will decrease Outcome: Progressing   Problem: Education: Goal: Understanding of CV disease, CV risk reduction, and recovery process will improve Outcome: Progressing   Problem: Activity: Goal: Ability to return to baseline activity level will improve Outcome: Progressing   Problem: Cardiovascular: Goal: Ability to achieve and maintain adequate cardiovascular perfusion will improve Outcome: Progressing Goal:  Vascular access site(s) Level 0-1 will be maintained Outcome: Progressing   Problem: Health Behavior/Discharge Planning: Goal: Ability to safely manage health-related needs after discharge will improve Outcome: Progressing   Problem: Education: Goal: Will demonstrate proper wound care and an understanding of methods to prevent future damage Outcome: Progressing Goal: Knowledge of disease or condition will improve Outcome: Progressing Goal: Knowledge of the prescribed therapeutic regimen will improve Outcome: Progressing Goal: Individualized Educational Video(s) Outcome: Progressing   Problem: Activity: Goal: Risk for activity intolerance will decrease Outcome: Progressing   Problem: Cardiac: Goal: Will achieve and/or maintain hemodynamic stability Outcome: Progressing   Problem: Clinical Measurements: Goal: Postoperative complications will be avoided or minimized Outcome: Progressing   Problem: Respiratory: Goal: Respiratory status will improve Outcome: Progressing   Problem: Skin Integrity: Goal: Wound healing without signs and symptoms of infection Outcome: Progressing Goal: Risk for impaired skin integrity will decrease Outcome: Progressing   Problem: Urinary Elimination: Goal: Ability to achieve and maintain adequate renal perfusion and functioning will improve Outcome: Progressing   Problem: Education: Goal: Ability to describe self-care measures that may prevent or decrease complications (Diabetes Survival Skills Education) will improve Outcome: Progressing Goal: Individualized Educational Video(s) Outcome: Progressing   Problem: Coping: Goal: Ability to adjust to condition or change in health will improve Outcome: Progressing   Problem: Fluid Volume: Goal: Ability to maintain a balanced intake and output will improve Outcome: Progressing   Problem: Health Behavior/Discharge Planning: Goal: Ability to identify and utilize available resources and  services will improve Outcome: Progressing Goal: Ability to manage health-related needs will improve Outcome: Progressing   Problem: Metabolic: Goal: Ability to maintain appropriate glucose levels will improve Outcome: Progressing   Problem: Nutritional: Goal: Maintenance of adequate nutrition will improve Outcome: Progressing Goal: Progress toward achieving an optimal weight will improve Outcome: Progressing   Problem: Skin Integrity: Goal: Risk for impaired skin integrity will decrease Outcome: Progressing   Problem: Tissue Perfusion: Goal: Adequacy of tissue perfusion will improve Outcome: Progressing

## 2024-02-03 LAB — BASIC METABOLIC PANEL WITH GFR
Anion gap: 9 (ref 5–15)
BUN: 33 mg/dL — ABNORMAL HIGH (ref 8–23)
CO2: 28 mmol/L (ref 22–32)
Calcium: 9.1 mg/dL (ref 8.9–10.3)
Chloride: 100 mmol/L (ref 98–111)
Creatinine, Ser: 1.47 mg/dL — ABNORMAL HIGH (ref 0.44–1.00)
GFR, Estimated: 39 mL/min — ABNORMAL LOW (ref >60)
Glucose, Bld: 114 mg/dL — ABNORMAL HIGH (ref 70–99)
Potassium: 3.7 mmol/L (ref 3.5–5.1)
Sodium: 137 mmol/L (ref 135–145)

## 2024-02-03 LAB — CBC
HCT: 28.9 % — ABNORMAL LOW (ref 36.0–46.0)
Hemoglobin: 9.2 g/dL — ABNORMAL LOW (ref 12.0–15.0)
MCH: 32.5 pg (ref 26.0–34.0)
MCHC: 31.8 g/dL (ref 30.0–36.0)
MCV: 102.1 fL — ABNORMAL HIGH (ref 80.0–100.0)
Platelets: 183 K/uL (ref 150–400)
RBC: 2.83 MIL/uL — ABNORMAL LOW (ref 3.87–5.11)
RDW: 25.6 % — ABNORMAL HIGH (ref 11.5–15.5)
WBC: 8.2 K/uL (ref 4.0–10.5)
nRBC: 0 % (ref 0.0–0.2)

## 2024-02-03 LAB — MAGNESIUM: Magnesium: 2.1 mg/dL (ref 1.7–2.4)

## 2024-02-03 MED ORDER — TORSEMIDE 20 MG PO TABS: 20.0000 mg | ORAL_TABLET | Freq: Every day | ORAL | Status: DC

## 2024-02-03 NOTE — Progress Notes (Addendum)
      [address] [employer] [address]             [phone]         12 Days Post-Op Procedure(s) (LRB): TRANSESOPHAGEAL ECHOCARDIOGRAM (N/A) CARDIOVERSION (N/A)  Subjective:  Patient understandably frustrated.  Received denial on insurance appeal yesterday for CIR placement.  She was only able to ambulate 84 ft yesterday.  Objective: Vital signs in last 24 hours: Temp:  [97.8 F (36.6 C)-98.4 F (36.9 C)] 97.9 F (36.6 C) (11/30 0751) Pulse Rate:  [77-104] 77 (11/30 0751) Cardiac Rhythm: Atrial flutter;Bundle branch block (11/30 0711) Resp:  [13-18] 15 (11/30 0751) BP: (104-133)/(67-83) 104/67 (11/30 0751) SpO2:  [93 %-96 %] 95 % (11/30 0751) Weight:  [60.4 kg] 60.4 kg (11/30 0553)  Intake/Output from previous day: 11/29 0701 - 11/30 0700 In: 730 [P.O.:720; I.V.:10] Out: -   General appearance: alert, cooperative, and no distress Heart: regular rate and rhythm and + murmur Lungs: clear to auscultation bilaterally Abdomen: soft, non-tender; bowel sounds normal; no masses,  no organomegaly Extremities: edema trace Wound: clean and dry  Lab Results: Recent Labs    02/02/24 0500 02/03/24 0335  WBC 7.9 8.2  HGB 9.2* 9.2*  HCT 29.4* 28.9*  PLT 179 183   BMET:  Recent Labs    02/02/24 0500 02/03/24 0335  NA 137 137  K 3.7 3.7  CL 98 100  CO2 30 28  GLUCOSE 111* 114*  BUN 32* 33*  CREATININE 1.62* 1.47*  CALCIUM  9.0 9.1    PT/INR: No results for input(s): LABPROT, INR in the last 72 hours. ABG    Component Value Date/Time   PHART 7.387 01/11/2024 0856   HCO3 19.3 (L) 01/11/2024 0856   TCO2 20 (L) 01/11/2024 0856   ACIDBASEDEF 5.0 (H) 01/11/2024 0856   O2SAT 62.2 02/01/2024 0536   CBG (last 3)  No results for input(s): GLUCAP in the last 72 hours.  Assessment/Plan: S/P Procedure(s) (LRB): TRANSESOPHAGEAL ECHOCARDIOGRAM (N/A) CARDIOVERSION (N/A)  CV-rate controlled A. Fib- on Eliquis , Amiodarone ,  Digoxin  Renal- creatinine improved.. defering diuretics to AHF team GI- dry mouth, biggest factor affecting oral intake, continue supportive care ID-endocarditis-continue antifungals Deconditioning- unfortunately insurance has denied CIR placement... will have to look into SNF vs. Possible d/c home with PT/OT..... patient frustrated she can't have surgery prior to leaving   LOS: 32 days    Rocky Shad, PA-C 02/03/2024 9:53 AM  No major changes today.  Torsemide  on hold for now.  Denied from CIR.  Weight is stable. Encouraged continued ambulation and PO intake.  Con Clunes, MD Cardiothoracic Surgery Pager: [phone]

## 2024-02-03 NOTE — Plan of Care (Signed)

## 2024-02-03 NOTE — Progress Notes (Signed)
 Mobility Specialist Progress Note:    02/03/24 1149  Mobility  Activity Ambulated with assistance  Level of Assistance Standby assist, set-up cues, supervision of patient - no hands on  Assistive Device None  Distance Ambulated (ft) 20 ft  Range of Motion/Exercises Active  Activity Response Tolerated fair  Mobility Referral Yes  Mobility visit 1 Mobility  Mobility Specialist Start Time (ACUTE ONLY) 1149  Mobility Specialist Stop Time (ACUTE ONLY) 1203  Mobility Specialist Time Calculation (min) (ACUTE ONLY) 14 min   Received pt laying in bed agreeable to quick session. C/o of fatigue but otherwise doing ok. Pt able to move, stand, and ambulate w/ SBA. Returned pt to bed w/ all needs met.   Venetia Keel Mobility Specialist Please Neurosurgeon or Rehab Office at [phone]

## 2024-02-04 ENCOUNTER — Encounter (HOSPITAL_COMMUNITY): Payer: Self-pay | Admitting: Thoracic Surgery (Cardiothoracic Vascular Surgery)

## 2024-02-04 ENCOUNTER — Inpatient Hospital Stay (HOSPITAL_COMMUNITY)

## 2024-02-04 DIAGNOSIS — Z7901 Long term (current) use of anticoagulants: Secondary | ICD-10-CM | POA: Diagnosis not present

## 2024-02-04 DIAGNOSIS — R040 Epistaxis: Secondary | ICD-10-CM | POA: Diagnosis not present

## 2024-02-04 HISTORY — PX: CONTROL OF EPISTAXIS: ENT8

## 2024-02-04 LAB — COOXEMETRY PANEL
Carboxyhemoglobin: 2.1 % — ABNORMAL HIGH (ref 0.5–1.5)
Carboxyhemoglobin: 3 % — ABNORMAL HIGH (ref 0.5–1.5)
Methemoglobin: 1.2 % (ref 0.0–1.5)
Methemoglobin: <0.7 % (ref 0.0–1.5)
O2 Saturation: 47.5 %
O2 Saturation: 55.3 %
Total hemoglobin: <6.9 g/dL — CL (ref 12.0–16.0)
Total hemoglobin: 9.4 g/dL — ABNORMAL LOW (ref 12.0–16.0)

## 2024-02-04 LAB — CBC
HCT: 29.4 % — ABNORMAL LOW (ref 36.0–46.0)
Hemoglobin: 9.4 g/dL — ABNORMAL LOW (ref 12.0–15.0)
MCH: 33 pg (ref 26.0–34.0)
MCHC: 32 g/dL (ref 30.0–36.0)
MCV: 103.2 fL — ABNORMAL HIGH (ref 80.0–100.0)
Platelets: 235 K/uL (ref 150–400)
RBC: 2.85 MIL/uL — ABNORMAL LOW (ref 3.87–5.11)
RDW: 25.9 % — ABNORMAL HIGH (ref 11.5–15.5)
WBC: 12.5 K/uL — ABNORMAL HIGH (ref 4.0–10.5)
nRBC: 0.3 % — ABNORMAL HIGH (ref 0.0–0.2)

## 2024-02-04 LAB — BASIC METABOLIC PANEL WITH GFR
Anion gap: 13 (ref 5–15)
Anion gap: 14 (ref 5–15)
BUN: 43 mg/dL — ABNORMAL HIGH (ref 8–23)
BUN: 47 mg/dL — ABNORMAL HIGH (ref 8–23)
CO2: 23 mmol/L (ref 22–32)
CO2: 25 mmol/L (ref 22–32)
Calcium: 9.1 mg/dL (ref 8.9–10.3)
Calcium: 9 mg/dL (ref 8.9–10.3)
Chloride: 100 mmol/L (ref 98–111)
Chloride: 100 mmol/L (ref 98–111)
Creatinine, Ser: 2.12 mg/dL — ABNORMAL HIGH (ref 0.44–1.00)
Creatinine, Ser: 2.46 mg/dL — ABNORMAL HIGH (ref 0.44–1.00)
GFR, Estimated: 25 mL/min — ABNORMAL LOW (ref >60)
GFR, Estimated: 21 mL/min — ABNORMAL LOW (ref >60)
Glucose, Bld: 168 mg/dL — ABNORMAL HIGH (ref 70–99)
Glucose, Bld: 107 mg/dL — ABNORMAL HIGH (ref 70–99)
Potassium: 4.6 mmol/L (ref 3.5–5.1)
Potassium: 5 mmol/L (ref 3.5–5.1)
Sodium: 136 mmol/L (ref 135–145)
Sodium: 139 mmol/L (ref 135–145)

## 2024-02-04 LAB — MAGNESIUM: Magnesium: 2.2 mg/dL (ref 1.7–2.4)

## 2024-02-04 MED ORDER — MUPIROCIN 2 % EX OINT
TOPICAL_OINTMENT | Freq: Two times a day (BID) | CUTANEOUS | Status: DC
  Administered 2024-02-07: 1 via NASAL
  Filled 2024-02-04: qty 22

## 2024-02-04 MED ORDER — APIXABAN 5 MG PO TABS: 5.0000 mg | ORAL_TABLET | Freq: Two times a day (BID) | ORAL | Status: DC

## 2024-02-04 MED ORDER — FUROSEMIDE 10 MG/ML IJ SOLN
80.0000 mg | Freq: Once | INTRAMUSCULAR | Status: AC
  Administered 2024-02-04: 80 mg via INTRAVENOUS
  Filled 2024-02-04: qty 8

## 2024-02-04 MED ORDER — FUROSEMIDE 10 MG/ML IJ SOLN
40.0000 mg | Freq: Once | INTRAMUSCULAR | Status: AC
  Administered 2024-02-04: 40 mg via INTRAVENOUS
  Filled 2024-02-04: qty 4

## 2024-02-04 MED ORDER — SODIUM CHLORIDE 0.9% FLUSH: 3.0000 mL | INTRAVENOUS | Status: DC | PRN

## 2024-02-04 MED ORDER — SALINE SPRAY 0.65 % NA SOLN
2.0000 | Freq: Four times a day (QID) | NASAL | Status: DC
  Filled 2024-02-04: qty 44

## 2024-02-04 MED ORDER — SODIUM CHLORIDE 0.9% FLUSH
3.0000 mL | Freq: Two times a day (BID) | INTRAVENOUS | Status: DC
  Administered 2024-02-04: 3 mL via INTRAVENOUS

## 2024-02-04 MED ORDER — SODIUM CHLORIDE 0.9 % IV SOLN: 250.0000 mL | INTRAVENOUS | Status: DC | PRN

## 2024-02-04 MED ORDER — MILRINONE LACTATE IN DEXTROSE 20-5 MG/100ML-% IV SOLN
0.1250 ug/kg/min | INTRAVENOUS | Status: DC
  Administered 2024-02-04 – 2024-02-07 (×3): 0.125 ug/kg/min via INTRAVENOUS
  Filled 2024-02-04 (×4): qty 100

## 2024-02-04 MED ORDER — AMIODARONE HCL 200 MG PO TABS
200.0000 mg | ORAL_TABLET | Freq: Two times a day (BID) | ORAL | Status: DC
  Administered 2024-02-05 – 2024-02-07 (×6): 200 mg via ORAL
  Filled 2024-02-04 (×7): qty 1

## 2024-02-04 NOTE — Procedures (Signed)
 PROCEDURE NOTE: Preoperative diagnosis: epistaxis, bilateral  Postoperative diagnosis: same  Procedure: Bilateral diagnostic nasal endoscopy (CPT 31231) + control of nasal hemorrhage (CPT 7172428097) Indication: Epistaxis EBL: 0 mL Surgeon: Penne Croak, DO   Procedure: The patient was identified and properly positioned. Topical Afrin were applied to the nasal cavity. The nasal mucosa, turbinates, septum, and sinus drainage pathways were visualized bilaterally with findings below. There was evidence of epistaxis bilaterally which appeared to originate along the bilateral anterior septum. There was evidence of left sided septal deviation and but no polyps. There was no blood or clot in posterior oropharynx. I proceeded to place absorbable nasal packing - bilaterally. Purulence was not encountered.   After placement of packing, epistaxis had been controlled as visualized by lack of active bleeding in oropharynx or nasal cavity.  Patient tolerated procedure well.

## 2024-02-04 NOTE — Consult Note (Signed)
 ENT Progress note:  Reason for Consult: epistaxis  Referring Physician:  Luwana Clunes, MD  HPI: Tracy Baker is an 68 y.o. female  who is POD11 after mitral valve repair on aspirin  and eliquis  who has had recurrent epistaxis on the floor for which ENT is consulted.   Aspirin  has been since discontinued. Pt hemostatic at bedside. Has had 2 episodes, first was not bad but second episode lasted about an hour. Resolved with afrin and pressure.   Bleeding was from left side.    INTERVAL 02/04/2024: Large epistaxis yesterday evening. Bleeding controlled with manual pressure and afrin. Small episode of epistaxis today. Nursing switched patient to humidified nasal canula.   Past Medical History:  Diagnosis Date   Thyroid  disease     Past Surgical History:  Procedure Laterality Date   APPENDECTOMY     AUGMENTATION MAMMAPLASTY Bilateral 1981   removed in 2006   BREAST IMPLANT REMOVAL Bilateral 2007   CARDIOVERSION N/A 01/22/2024   Procedure: CARDIOVERSION;  Surgeon: Rolan Ezra RAMAN, MD;  Location: [facility] INVASIVE CV LAB;  Service: Cardiovascular;  Laterality: N/A;   CHOLECYSTECTOMY  2010   COLONOSCOPY WITH PROPOFOL  N/A 06/30/2021   Procedure: COLONOSCOPY WITH PROPOFOL ;  Surgeon: Jinny Carmine, MD;  Location: [facility] ENDOSCOPY;  Service: Endoscopy;  Laterality: N/A;   INTRAOPERATIVE TRANSESOPHAGEAL ECHOCARDIOGRAM N/A 01/10/2024   Procedure: ECHOCARDIOGRAM, TRANSESOPHAGEAL, INTRAOPERATIVE;  Surgeon: Kerrin Elspeth BROCKS, MD;  Location: [facility] OR;  Service: Open Heart Surgery;  Laterality: N/A;   LAPAROSCOPIC TOTAL HYSTERECTOMY  2006   MITRAL VALVE REPAIR N/A 01/10/2024   Procedure: REPAIR, MITRAL VALVE;  Surgeon: Kerrin Elspeth BROCKS, MD;  Location: [facility] OR;  Service: Open Heart Surgery;  Laterality: N/A;   RIGHT/LEFT HEART CATH AND CORONARY ANGIOGRAPHY N/A 01/04/2024   Procedure: RIGHT/LEFT HEART CATH AND CORONARY ANGIOGRAPHY;  Surgeon: Verlin Lonni BIRCH, MD;  Location: [facility] INVASIVE CV LAB;  Service:  Cardiovascular;  Laterality: N/A;   TOOTH EXTRACTION N/A 01/07/2024   Procedure: DENTAL RESTORATION/EXTRACTIONS;  Surgeon: Sheryle Hamilton, DMD;  Location: [facility] OR;  Service: Oral Surgery;  Laterality: N/A;  EXTRACTION OF TEETH #2, #6, #7, #16, #29   TRANSESOPHAGEAL ECHOCARDIOGRAM (CATH LAB) N/A 01/01/2024   Procedure: TRANSESOPHAGEAL ECHOCARDIOGRAM;  Surgeon: Lonni Slain, MD;  Location: [facility] INVASIVE CV LAB;  Service: Cardiovascular;  Laterality: N/A;   TRANSESOPHAGEAL ECHOCARDIOGRAM (CATH LAB) N/A 01/22/2024   Procedure: TRANSESOPHAGEAL ECHOCARDIOGRAM;  Surgeon: Rolan Ezra RAMAN, MD;  Location: [facility] INVASIVE CV LAB;  Service: Cardiovascular;  Laterality: N/A;    Family History  Problem Relation Age of Onset   Hypertension Mother    Diabetes Father    Breast cancer Neg Hx     Social History:  reports that she has been smoking cigarettes. She has never used smokeless tobacco. She reports current alcohol use. She reports that she does not use drugs.  Allergies:  Allergies  Allergen Reactions   Codeine Anaphylaxis and Nausea And Vomiting   Red Dye #40 (Allura Red) Nausea Only    Medications: I have reviewed the patient's current medications.  Results for orders placed or performed during the hospital encounter of 12/28/23 (from the past 48 hours)  Magnesium      Status: None   Collection Time: 02/03/24  3:35 AM  Result Value Ref Range   Magnesium  2.1 1.7 - 2.4 mg/dL    Comment: Performed at [facility], [address] [place], [place] [address]  Basic metabolic panel with GFR     Status: Abnormal   Collection Time:  02/03/24  3:35 AM  Result Value Ref Range   Sodium 137 135 - 145 mmol/L   Potassium 3.7 3.5 - 5.1 mmol/L   Chloride 100 98 - 111 mmol/L   CO2 28 22 - 32 mmol/L   Glucose, Bld 114 (H) 70 - 99 mg/dL    Comment: Glucose reference range applies only to samples taken after fasting for at least 8 hours.   BUN 33 (H) 8 - 23 mg/dL   Creatinine, Ser 8.52 (H) 0.44 -  1.00 mg/dL   Calcium  9.1 8.9 - 10.3 mg/dL   GFR, Estimated 39 (L) >60 mL/min    Comment: (NOTE) Calculated using the CKD-EPI Creatinine Equation (2021)    Anion gap 9 5 - 15    Comment: Performed at [facility], [address], [place], [place] [address]  CBC     Status: Abnormal   Collection Time: 02/03/24  3:35 AM  Result Value Ref Range   WBC 8.2 4.0 - 10.5 K/uL   RBC 2.83 (L) 3.87 - 5.11 MIL/uL   Hemoglobin 9.2 (L) 12.0 - 15.0 g/dL   HCT 71.0 (L) 63.9 - 53.9 %   MCV 102.1 (H) 80.0 - 100.0 fL   MCH 32.5 26.0 - 34.0 pg   MCHC 31.8 30.0 - 36.0 g/dL   RDW 74.3 (H) 88.4 - 84.4 %   Platelets 183 150 - 400 K/uL   nRBC 0.0 0.0 - 0.2 %    Comment: Performed at [facility], [address], [place], [place] [address]  Magnesium      Status: None   Collection Time: 02/04/24  3:55 AM  Result Value Ref Range   Magnesium  2.2 1.7 - 2.4 mg/dL    Comment: Performed at [facility], [address], [place], [place] [address]  Basic metabolic panel with GFR     Status: Abnormal   Collection Time: 02/04/24  3:55 AM  Result Value Ref Range   Sodium 136 135 - 145 mmol/L   Potassium 4.6 3.5 - 5.1 mmol/L   Chloride 100 98 - 111 mmol/L   CO2 23 22 - 32 mmol/L   Glucose, Bld 168 (H) 70 - 99 mg/dL    Comment: Glucose reference range applies only to samples taken after fasting for at least 8 hours.   BUN 43 (H) 8 - 23 mg/dL   Creatinine, Ser 7.87 (H) 0.44 - 1.00 mg/dL   Calcium  9.1 8.9 - 10.3 mg/dL   GFR, Estimated 25 (L) >60 mL/min    Comment: (NOTE) Calculated using the CKD-EPI Creatinine Equation (2021)    Anion gap 13 5 - 15    Comment: Performed at [facility], [address] [place], [place] [address]  CBC     Status: Abnormal   Collection Time: 02/04/24  3:55 AM  Result Value Ref Range   WBC 12.5 (H) 4.0 - 10.5 K/uL   RBC 2.85 (L) 3.87 - 5.11 MIL/uL   Hemoglobin 9.4 (L) 12.0 - 15.0 g/dL   HCT 70.5 (L) 63.9 - 53.9 %   MCV 103.2 (H) 80.0 - 100.0 fL   MCH  33.0 26.0 - 34.0 pg   MCHC 32.0 30.0 - 36.0 g/dL   RDW 74.0 (H) 88.4 - 84.4 %   Platelets 235 150 - 400 K/uL   nRBC 0.3 (H) 0.0 - 0.2 %    Comment: Performed at [facility], [address] [place], [place] [address]  Cooxemetry  Panel (carboxy, met, total hgb, O2 sat)     Status: Abnormal   Collection Time: 02/04/24  9:06 AM  Result Value Ref Range   Total hemoglobin <6.9 (LL) 12.0 - 16.0 g/dL    Comment: CRITICAL RESULT CALLED TO, READ BACK BY AND VERIFIED WITH: JUSTIN GARDNER,RN AT 0925 02/04/2024 BY ZBEECH.    O2 Saturation 47.5 %   Carboxyhemoglobin 2.1 (H) 0.5 - 1.5 %   Methemoglobin 1.2 0.0 - 1.5 %    Comment: Performed at [facility], [address], [place], [place] [address]  Cooxemetry Panel (carboxy, met, total hgb, O2 sat)     Status: Abnormal   Collection Time: 02/04/24 11:17 AM  Result Value Ref Range   Total hemoglobin 9.4 (L) 12.0 - 16.0 g/dL   O2 Saturation 44.6 %   Carboxyhemoglobin 3.0 (H) 0.5 - 1.5 %   Methemoglobin <0.7 0.0 - 1.5 %    Comment: Performed at [facility], [address] [place], [place] [address]  Basic metabolic panel     Status: Abnormal   Collection Time: 02/04/24 12:57 PM  Result Value Ref Range   Sodium 139 135 - 145 mmol/L   Potassium 5.0 3.5 - 5.1 mmol/L   Chloride 100 98 - 111 mmol/L   CO2 25 22 - 32 mmol/L   Glucose, Bld 107 (H) 70 - 99 mg/dL    Comment: Glucose reference range applies only to samples taken after fasting for at least 8 hours.   BUN 47 (H) 8 - 23 mg/dL   Creatinine, Ser 7.53 (H) 0.44 - 1.00 mg/dL   Calcium  9.0 8.9 - 10.3 mg/dL   GFR, Estimated 21 (L) >60 mL/min    Comment: (NOTE) Calculated using the CKD-EPI Creatinine Equation (2021)    Anion gap 14 5 - 15    Comment: Performed at [facility], [address], [place], [place] [address]    DG Chest Port 1 View Result Date: 02/04/2024 CLINICAL DATA:  Status post mitral valve repair on 01/10/2024. EXAM: PORTABLE CHEST 1 VIEW  COMPARISON:  01/29/2024 FINDINGS: The cardiac silhouette remains borderline enlarged. Mildly improved left basilar atelectasis and significantly improved right basilar atelectasis. Small bilateral pleural effusions with mild improvement. Decreased depth of inspiration with increased prominence of the pulmonary vasculature and interstitial markings. Diffuse osteopenia. Stable median sternotomy wires. Right PICC tip in the region of the superior cavoatrial junction. IMPRESSION: 1. Mildly improved left basilar atelectasis and significantly improved right basilar atelectasis. 2. Small bilateral pleural effusions with mild improvement. 3. Decreased depth of inspiration with increased prominence of the pulmonary vasculature and interstitial markings. This could be due to the decreased depth of inspiration or mild CHF. Electronically Signed   By: Elspeth Bathe M.D.   On: 02/04/2024 14:16    ROS:ROS  Blood pressure 136/64, pulse 75, temperature 97.7 F (36.5 C), temperature source Oral, resp. rate 19, height 5' 4 (1.626 m), weight 61.9 kg, SpO2 92%.  PHYSICAL EXAM:  CONSTITUTIONAL: well developed, nourished, no distress and alert and oriented x 3 CARDIOVASCULAR: normal rate PULMONARY/CHEST WALL: effort normal HENT: Head : normocephalic and atraumatic Nose: clot in right nasal cavity, no active bleeding. Source is right anterior septum. Small amount of fresh blood along anterior septum on the left.  Mouth/Throat:  no bleeding  NECK: supple, FROM  Given the patient's symptoms and incomplete visualization of critical sinonasal areas with anterior rhinoscopy, a separately performed diagnostic nasal endoscopy procedure is indicated for a  complete rhinologic evaluation per American Rhinologic Society recommendations (https://www.american-rhinologic.org/position-statements)  I personally ordered, reviewed and interpreted the following with the patient today  Procedure Note Diagnostic Nasal Endoscopy CPT  CODE -- 68768 - Mod 25  Prior to initiating any procedures, risks/benefits/alternatives were explained to the patient and verbal consent obtained.  Pre-procedure diagnosis: Concern for posterior epistaxis Post-procedure diagnosis: same Indication: See pre-procedure diagnosis and physical exam above Complications: None apparent EBL: 0 mL Anesthesia: Lidocaine  4% and topical decongestant was topically sprayed in each nasal cavity  Description of Procedure:  Patient was identified. A flexible fiberoptic endoscope was utilized to evaluate the sinonasal cavities, mucosa, sinus ostia and turbinates and septum. Anterior septum with fresh blood present bilaterally. Large clot on the right. Removed. Posterior nasopharynx without active bleeding or obvious source.   Assessment/Plan: Tracy Baker is an 68 y.o. female  s/p mitral valve repair on aspirin  and eliquis  who has had recurrent epistaxis on the floor for which ENT is reconsulted. ASA and elliquis held. Hemostatic at bedside.   Discussed the nature of epistaxis in setting of anticoagulation and she may continue to bleed and this is OK as long as we are able to stop the episodes. Reassuring that last episode stopped with conservative measures. Discussed continued observation and afrin/pressure to treat bleeding versus treating with surgicel fibrillar.  Discussed surgicel will dissolve on its own and does not require removal.   - Absorbable packing placed bilateral nares - avoid nose blowing, picking - sneeze through mouth - Recommend supplemental humidified O2 via mouth (nasal cavity is obstructed), currently nasal canula in mouth, nursing informed okay to switch to mask - Prefer holding blood thinners overnight, but okay to restart if medically necessary  Instructions to help prevent future episodes of nose bleeds and management: - Reduce nasal manipulation - no nose picking, no tissues in the nose other than for dabbing.  - Use Afrin nasal  spray for ACTIVE bleeding on the bleeding side and hold firm, continuous nasal pressure (on nostril - soft part of the nose, not the bone)  for 15 mins continuously for ACTIVE bleeding.  - Maintain tight BP control, as high blood pressure will certainly increase epistaxis occurrences.   Call ENT with any additional questions or concerns   02/04/2024, 6:55 PM

## 2024-02-04 NOTE — TOC Progression Note (Signed)
 Transition of Care [facility]) - Progression Note    Patient Details  Name: Tracy Baker MRN: [id number] Date of Birth: 1955/06/24  Transition of Care [facility]) CM/SW Contact  Justina Delcia Czar, RN Phone Number: [phone] 02/04/2024, 2:46 PM  Clinical Narrative:    Spoke to pt at bedside. States she does not feel she will strong enough to go home. Aware of insurance denial for CIR. She is agreeable to SNF rehab. Gave permission to speak to dtrs and son about SNF rehab.   Will need PT/OT evaluation and recommendations.    Expected Discharge Plan: Skilled Nursing Facility Barriers to Discharge: Continued Medical Work up               Expected Discharge Plan and Services   Discharge Planning Services: CM Consult Post Acute Care Choice: Skilled Nursing Facility Living arrangements for the past 2 months: Apartment                                       Social Drivers of Health (SDOH) Interventions SDOH Screenings   Food Insecurity: No Food Insecurity (12/29/2023)  Housing: Low Risk  (12/29/2023)  Transportation Needs: No Transportation Needs (12/29/2023)  Utilities: Not At Risk (12/29/2023)  Alcohol Screen: Low Risk  (06/08/2022)  Depression (PHQ2-9): Low Risk  (11/08/2023)  Social Connections: Socially Isolated (12/29/2023)  Tobacco Use: High Risk (01/10/2024)    Readmission Risk Interventions     No data to display

## 2024-02-04 NOTE — Progress Notes (Addendum)
 OT Cancellation Note  Patient Details Name: Tracy Baker MRN: [id number] DOB: Apr 25, 1955   Cancelled Treatment:    Reason Eval/Treat Not Completed: Patient declined, no reason specified (reports nausea and nosebleed earlier, follow follow up for OT tx later this PM as schedule permits)  1405: Reattempted and pt still declining OOB Mobility. Will follow up next date as schedule permits  [name], OTD, OTR/L SecureChat Preferred Acute Rehab [phone]   Tracy Baker 02/04/2024, 9:39 AM

## 2024-02-04 NOTE — H&P (View-Only) (Signed)
 Advanced Heart Failure Progress Update  Repeat co-ox 55% with hgb of 9.2. Concerned with low output given rapidly rising Cr 1.47>2.12>2.46. She has been having persistent nausea and vomiting. Unable to take PO. Suspect that this may be low output.   Plan: - restart milrinone  0.125 mcg/kg/min - scheduled for RHC with Dr. Zenaida tomorrow Informed Consent   Shared Decision Making/Informed Consent The risks [stroke (1 in 1000), death (1 in 1000), kidney failure [usually temporary] (1 in 500), bleeding (1 in 200), allergic reaction [possibly serious] (1 in 200)], benefits (diagnostic support and management of coronary artery disease) and alternatives of a cardiac catheterization were discussed in detail with Tracy Baker and she is willing to proceed.     Tracy Muntean, NP 02/04/24, 02/04/24  Advanced Heart Failure Team Pager [phone] (M-F; 7a - 5p)  Please contact [facility] for night-coverage after hours (4p -7a ) and weekends on [email or web address]

## 2024-02-04 NOTE — Progress Notes (Signed)
 Checked back in on patient and asked if she was ready to take her morning meds but patient refused, Educated on the importance on taking certain medications but patient still refused. Patient refused antiemetic as well. CVP monitoring setup with no complications, with a reading of 4.

## 2024-02-04 NOTE — Plan of Care (Signed)
  Problem: Clinical Measurements: Goal: Will remain free from infection Outcome: Progressing   Problem: Activity: Goal: Risk for activity intolerance will decrease Outcome: Progressing   

## 2024-02-04 NOTE — Plan of Care (Signed)
  Problem: Education: Goal: Knowledge of General Education information will improve Description: Including pain rating scale, medication(s)/side effects and non-pharmacologic comfort measures Outcome: Progressing   Problem: Health Behavior/Discharge Planning: Goal: Ability to manage health-related needs will improve Outcome: Not Progressing   Problem: Clinical Measurements: Goal: Ability to maintain clinical measurements within normal limits will improve Outcome: Progressing Goal: Will remain free from infection Outcome: Progressing Goal: Diagnostic test results will improve Outcome: Progressing Goal: Respiratory complications will improve Outcome: Not Progressing Goal: Cardiovascular complication will be avoided Outcome: Progressing   Problem: Activity: Goal: Risk for activity intolerance will decrease Outcome: Progressing   Problem: Nutrition: Goal: Adequate nutrition will be maintained Outcome: Progressing   Problem: Coping: Goal: Level of anxiety will decrease Outcome: Not Progressing   Problem: Elimination: Goal: Will not experience complications related to bowel motility Outcome: Progressing Goal: Will not experience complications related to urinary retention Outcome: Progressing   Problem: Pain Managment: Goal: General experience of comfort will improve and/or be controlled Outcome: Progressing   Problem: Safety: Goal: Ability to remain free from injury will improve Outcome: Progressing   Problem: Skin Integrity: Goal: Risk for impaired skin integrity will decrease Outcome: Progressing   Problem: Education: Goal: Understanding of CV disease, CV risk reduction, and recovery process will improve Outcome: Progressing Goal: Individualized Educational Video(s) Outcome: Progressing   Problem: Activity: Goal: Ability to return to baseline activity level will improve Outcome: Progressing   Problem: Cardiovascular: Goal: Ability to achieve and maintain  adequate cardiovascular perfusion will improve Outcome: Progressing Goal: Vascular access site(s) Level 0-1 will be maintained Outcome: Progressing   Problem: Health Behavior/Discharge Planning: Goal: Ability to safely manage health-related needs after discharge will improve Outcome: Progressing   Problem: Education: Goal: Will demonstrate proper wound care and an understanding of methods to prevent future damage Outcome: Progressing Goal: Knowledge of disease or condition will improve Outcome: Progressing Goal: Knowledge of the prescribed therapeutic regimen will improve Outcome: Progressing Goal: Individualized Educational Video(s) Outcome: Progressing   Problem: Activity: Goal: Risk for activity intolerance will decrease Outcome: Progressing   Problem: Cardiac: Goal: Will achieve and/or maintain hemodynamic stability Outcome: Progressing   Problem: Clinical Measurements: Goal: Postoperative complications will be avoided or minimized Outcome: Progressing   Problem: Skin Integrity: Goal: Wound healing without signs and symptoms of infection Outcome: Progressing Goal: Risk for impaired skin integrity will decrease Outcome: Progressing   Problem: Urinary Elimination: Goal: Ability to achieve and maintain adequate renal perfusion and functioning will improve Outcome: Progressing   Problem: Education: Goal: Ability to describe self-care measures that may prevent or decrease complications (Diabetes Survival Skills Education) will improve Outcome: Progressing Goal: Individualized Educational Video(s) Outcome: Progressing   Problem: Fluid Volume: Goal: Ability to maintain a balanced intake and output will improve Outcome: Progressing   Problem: Health Behavior/Discharge Planning: Goal: Ability to identify and utilize available resources and services will improve Outcome: Progressing Goal: Ability to manage health-related needs will improve Outcome: Progressing    Problem: Tissue Perfusion: Goal: Adequacy of tissue perfusion will improve Outcome: Progressing

## 2024-02-04 NOTE — Progress Notes (Addendum)
 Patient still nauseated but states a little better after zofran . Patient stated that she is unable to swallow her pills this evening. She took a sip of water  and stated I don't think I can get the pills down.  Nighttime PO meds held.

## 2024-02-04 NOTE — Progress Notes (Addendum)
 [address] [place] [place] [address]             [phone]      13 Days Post-Op Procedure(s) (LRB): TRANSESOPHAGEAL ECHOCARDIOGRAM (N/A) CARDIOVERSION (N/A) Subjective: Patient reports she had another bloody nose last night that was the worst of all of them.   Objective: Vital signs in last 24 hours: Temp:  [97.5 F (36.4 C)-98.1 F (36.7 C)] 97.5 F (36.4 C) (12/01 0358) Pulse Rate:  [73-94] 78 (11/30 2308) Cardiac Rhythm: Normal sinus rhythm (12/01 0420) Resp:  [15-24] 24 (11/30 2308) BP: (94-163)/(58-85) 94/58 (11/30 2308) SpO2:  [91 %-97 %] 91 % (11/30 2308) Weight:  [61.9 kg] 61.9 kg (12/01 0640)  Hemodynamic parameters for last 24 hours:    Intake/Output from previous day: 11/30 0701 - 12/01 0700 In: 130 [P.O.:120; I.V.:10] Out: -  Intake/Output this shift: No intake/output data recorded.  General appearance: alert, cooperative, and no distress Neurologic: intact Heart: regular rate and rhythm, S1, S2 normal, no murmur, click, rub or gallop Lungs: diminished bibasilar breath sounds Abdomen: soft, non-tender; bowel sounds normal; no masses,  no organomegaly Extremities: extremities normal, atraumatic, no cyanosis or edema Wound: Clean and dry without sign of infection  Lab Results: Recent Labs    02/03/24 0335 02/04/24 0355  WBC 8.2 12.5*  HGB 9.2* 9.4*  HCT 28.9* 29.4*  PLT 183 235   BMET:  Recent Labs    02/03/24 0335 02/04/24 0355  NA 137 136  K 3.7 4.6  CL 100 100  CO2 28 23  GLUCOSE 114* 168*  BUN 33* 43*  CREATININE 1.47* 2.12*  CALCIUM  9.1 9.1    PT/INR: No results for input(s): LABPROT, INR in the last 72 hours. ABG    Component Value Date/Time   PHART 7.387 01/11/2024 0856   HCO3 19.3 (L) 01/11/2024 0856   TCO2 20 (L) 01/11/2024 0856   ACIDBASEDEF 5.0 (H) 01/11/2024 0856   O2SAT 62.2 02/01/2024 0536   CBG (last 3)  No results for input(s): GLUCAP in the last 72 hours.  Assessment/Plan: S/P  Procedure(s) (LRB): TRANSESOPHAGEAL ECHOCARDIOGRAM (N/A) CARDIOVERSION (N/A)  ENT: Recurrent epistaxis, ENT saw her in the ICU and gave surgiflow in the left nare. On Eliquis  but was held due to epistaxis last night. Stopped with Afrin and conservative measures, patient feels like it lasted an hour but RN is unsure how long it lasted. Patient reports from both nares, will reconsult ENT. Will continue to hold eliquis  for now as discussed with Dr. Kerrin. Dry mouth, magic mouthwash, biotene dry mouth rinse and lozenges started with some improvement but worse this AM.   CV: Postop afib, NSR HR 70s this AM. On Eliquis , Amiodarone  200mg  BID, and Digoxin  0.0625. Amiodarone  was held by the nurse yesterday due to nausea and Eliquis  was held by the provider due to epistaxis. Attempted DCCV on 11/18, aborted due to severe mitral regurgitation. She will need redo mitral valve replacement eventually. AHF following, on Spironolactone  and hydralazine . BP stable, SBP mostly 110s-140s.    Pulm: Saturating 83-90% on RA this AM had previously been saturating in the high 90s on RA. Last CXR 11/25 with small bilateral pleural effusions and bibasilar atelectasis. Will get CXR this AM. Encourage IS and ambulation.    GI: Severe malnutrition, poor PO intake. Patient reports her appetite was better yesterday until she developed epistaxis and became nauseous. Continue magic mouthwash and biotene. Encourage increased PO intake. +BM. Hopefully  appetite will improve today.    Renal: Cr 2.12, increased from 1.47 yesterday. UO 350cc/24hrs recorded. Under preop weight but +3lbs from yesterday. Demadex  has been held, unsure why the bump in creatinine. Will continue to hold Demadex  for now and will discuss diuresis with AHF team.    ID: Candidemia/fungal endocarditis, continue Fluconazole . Leukocytosis, WBC 12.5 this AM. Afebrile.    DVT Prophylaxis: No lovenox , on Eliquis     Deconditioning: Insurance denied CIR, plan will be  SNF vs HH PT/OT. Patient only ambulating about 93ft, I think SNF will likely be better option. Continue to work with PT/OT.    Dispo: Patient will need SNF vs HH at discharge, very frustrated insurance denied CIR. Plan is for patient to return in about 3 weeks once she has recovered further from surgery for redo surgery.    LOS: 33 days    [name] Bend, PA-C 02/04/2024  Patient seen and examined, agree with above In SR CVP only 4 but creatinine increased.  Started back on milrinone .  For right heart cath tomorrow  Elspeth BROCKS. Kerrin, MD [facility] [phone]

## 2024-02-04 NOTE — Progress Notes (Signed)
 Patient ID: Tracy Baker, female   DOB: [birth year], 68 y.o.   MRN: [id number]     Advanced Heart Failure Rounding Note  Cardiologist: Lonni Cash, MD  Chief Complaint: S/p MV Repair / MV endocarditis   Patient Profile   Tracy Baker is a 68 y.o. female with hx of recent nephrolithiasis s/p ureteral stent and lithotripsy at [facility] 10/10 with stent removal. 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 Post-op course c/b post cardiotomy shock, junctional bradycardia, afib w/ RVR and HAP.   Subjective:    Torsemide  held over the weekend. sCr bumped 1.47>2.12. BP ok. In NSR.  O2 sats low overnight on RA. No I/Os. Weight up 3 lbs overnight. Repeat CXR looks improved from prior.  Co-ox 48%, hgb reading on this draw was off. Sending repeat.  Nauseated overnight, did not sleep well.   Objective:    Weight Range: 61.9 kg Body mass index is 23.42 kg/m.   Vital Signs:   Temp:  [97.4 F (36.3 C)-98.1 F (36.7 C)] 97.4 F (36.3 C) (12/01 0817) Pulse Rate:  [73-94] 73 (12/01 0817) Resp:  [18-24] 23 (12/01 0817) BP: (94-163)/(58-85) 135/70 (12/01 0817) SpO2:  [90 %-97 %] 90 % (12/01 0817) Weight:  [61.9 kg] 61.9 kg (12/01 0640) Last BM Date : 02/02/24  Weight change: Filed Weights   02/02/24 0503 02/03/24 0553 02/04/24 0640  Weight: 58.7 kg 60.4 kg 61.9 kg   Intake/Output:  Intake/Output Summary (Last 24 hours) at 02/04/2024 0957 Last data filed at 02/03/2024 2145 Gross per 24 hour  Intake 130 ml  Output --  Net 130 ml    Physical Exam   General: Pale appearing. No distress  Cardiac: JVP ~12 cm. No murmurs  Resp: Fine crackles in RML  and bilateral lower lobes Abdomen: Soft, non-distended.  Extremities: Warm and dry.  No peripheral edema.  Neuro: A&O x3. Affect pleasant.   Telemetry   SR 70s (personally reviewed)  Labs    CBC Recent Labs    02/03/24 0335 02/04/24 0355  WBC 8.2 12.5*  HGB 9.2* 9.4*  HCT 28.9* 29.4*  MCV  102.1* 103.2*  PLT 183 235   Basic Metabolic Panel Recent Labs    88/69/74 0335 02/04/24 0355  NA 137 136  K 3.7 4.6  CL 100 100  CO2 28 23  GLUCOSE 114* 168*  BUN 33* 43*  CREATININE 1.47* 2.12*  CALCIUM  9.1 9.1  MG 2.1 2.2   Liver Function Tests No results for input(s): AST, ALT, ALKPHOS, BILITOT, PROT, ALBUMIN  in the last 72 hours.  Medications:    Scheduled Medications:  amiodarone   200 mg Oral BID   Chlorhexidine  Gluconate Cloth  6 each Topical Daily   digoxin   0.0625 mg Oral QODAY   docusate sodium   200 mg Oral Daily   feeding supplement  237 mL Oral BID BM   hydrALAZINE   20 mg Oral TID   mirtazapine   15 mg Oral QHS   pantoprazole   40 mg Oral Daily   potassium chloride   40 mEq Oral Daily   sodium chloride  flush  10-40 mL Intracatheter Q12H   thiamine   100 mg Oral Daily    Infusions:  fluconazole  (DIFLUCAN ) IV 400 mg (02/03/24 1120)    PRN Medications: bisacodyl  **OR** [DISCONTINUED] bisacodyl , clonazePAM , magic mouthwash, menthol , ondansetron  (ZOFRAN ) IV, mouth rinse, sodium chloride , sodium chloride  flush  Assessment/Plan   1. MV Endocarditis d/t Candida Albicans candidemia/fungemia, S/p MV repair w/ Post-cardiotomy Shock  - s/p MV  repair 01/10/24 by Dr. Kerrin for Candida endocarditis.  - Pre-op L/RHC 01/04/24 with no CAD and normal L/R heart pressures.  - Intra-op TEE LVEF 60-65%, RV mildly reduced, small immobile mass on anterior valve leaflet, mod MR, trivial TR - Failed initial Milrinone  wean. Echo 11/13 per Dr. Rolan review: EF 60-65%, moderate RV dysfunction, s/p MV repair with probably moderate mitral regurgitation.  Milrinone  restarted.  -TEE done 11/18  LV EF 60-65%, mild RV dysfunction, severe mitral regurgitation of repaired valve.   - Off milrinone  since 11/19. Coox stable. Repeat co-ox this am 48, however hgb off. Send repeat, if low may need to resume milrinone  - Continue digoxin  0.0625 mg every other day d/t elevated  level - Continue hydralazine  20 mg tid for afterload reduction - Off spiro with fluctant Cr - Volume up on exam and sagging O2sats. Given IV Lasix  40 mg.  - Discussed TEE results with structural heart service (Dr. Wonda), does not appear to be a good mTEER candidate.  - She is going to need eventual redo mitral valve surgery for mitral valve replacement, timing is uncertain.  Discussed with Dr. Kerrin, would like more time to recover from her recent MV repair (PT, nutrition). Planning CIR (if approved), then home and return for redo surgery in a few weeks.  2. Transient Junctional Nola  - Now in rate controlled AF - monitor w/ amiodarone   - stable  3. Atrial fibrillation, post-op - Remains in AF. Rate controlled  - Continue PO amio 200 mg bid  - Continue apixaban .  - in NSR overnight - no change  4. AKI on CKD 3b - Scr baseline 1.4-1.5 - have been holding torsemide  and spiro - Cr bumped 1.47>2.12 overnight.   5. Protein-calorie malnutrition - encourage po intake. Appetite starting to improve. Keep off scopolamine  patch which caused extreme dry mouth. - continue Ensure  Continue aggressive PT/OT. Awaiting insurance auth for CIR.   Length of stay: 8  Gid Schoffstall, NP  9:57 AM  Advanced Heart Failure Team Pager [phone] (M-F; 7a - 5p)  Please contact [facility] for night-coverage after hours (4p -7a ) and weekends on [email or web address]

## 2024-02-04 NOTE — Progress Notes (Addendum)
 Advanced Heart Failure Progress Update  Repeat co-ox 55% with hgb of 9.2. Concerned with low output given rapidly rising Cr 1.47>2.12>2.46. She has been having persistent nausea and vomiting. Unable to take PO. Suspect that this may be low output.   Plan: - restart milrinone  0.125 mcg/kg/min - scheduled for RHC with Dr. Zenaida tomorrow Informed Consent   Shared Decision Making/Informed Consent The risks [stroke (1 in 1000), death (1 in 1000), kidney failure [usually temporary] (1 in 500), bleeding (1 in 200), allergic reaction [possibly serious] (1 in 200)], benefits (diagnostic support and management of coronary artery disease) and alternatives of a cardiac catheterization were discussed in detail with Tracy Baker and she is willing to proceed.     [name], NP 02/04/24, 02/04/24  Advanced Heart Failure Team Pager [phone] (M-F; 7a - 5p)  Please contact [facility] for night-coverage after hours (4p -7a ) and weekends on [email or web address]

## 2024-02-04 NOTE — Progress Notes (Signed)
 Patient refusing morning meds for now as patient states she feels nauseous, offered patient nausea medication but patient refused. Asked patient if she feels anxious and offered patients' PRN Klonopin  but patient refused. Will continue to monitor.

## 2024-02-04 NOTE — Progress Notes (Signed)
 PA got patient to accept her morning meds, patient vomitted as she was trying to get first pill down, patient received none of the medications but accepted IV zofran . Will continue to monitor.

## 2024-02-05 ENCOUNTER — Encounter (HOSPITAL_COMMUNITY): Payer: Self-pay | Admitting: Cardiology

## 2024-02-05 ENCOUNTER — Inpatient Hospital Stay (HOSPITAL_COMMUNITY)
Admission: EM | Disposition: A | Discharge status: Rehabilitation Facility | Payer: Self-pay | Source: Home / Self Care | Attending: Thoracic Surgery (Cardiothoracic Vascular Surgery)

## 2024-02-05 HISTORY — PX: RIGHT HEART CATH: CATH118263

## 2024-02-05 LAB — POCT I-STAT EG7
Acid-Base Excess: 3 mmol/L — ABNORMAL HIGH (ref 0.0–2.0)
Acid-Base Excess: 4 mmol/L — ABNORMAL HIGH (ref 0.0–2.0)
Bicarbonate: 27.5 mmol/L (ref 20.0–28.0)
Bicarbonate: 27.9 mmol/L (ref 20.0–28.0)
Calcium, Ion: 1.09 mmol/L — ABNORMAL LOW (ref 1.15–1.40)
Calcium, Ion: 1.12 mmol/L — ABNORMAL LOW (ref 1.15–1.40)
HCT: 25 % — ABNORMAL LOW (ref 36.0–46.0)
HCT: 25 % — ABNORMAL LOW (ref 36.0–46.0)
Hemoglobin: 8.5 g/dL — ABNORMAL LOW (ref 12.0–15.0)
Hemoglobin: 8.5 g/dL — ABNORMAL LOW (ref 12.0–15.0)
O2 Saturation: 53 %
O2 Saturation: 54 %
Potassium: 3.5 mmol/L (ref 3.5–5.1)
Potassium: 3.6 mmol/L (ref 3.5–5.1)
Sodium: 139 mmol/L (ref 135–145)
Sodium: 139 mmol/L (ref 135–145)
TCO2: 29 mmol/L (ref 22–32)
TCO2: 29 mmol/L (ref 22–32)
pCO2, Ven: 39 mmHg — ABNORMAL LOW (ref 44–60)
pCO2, Ven: 39.4 mmHg — ABNORMAL LOW (ref 44–60)
pH, Ven: 7.456 — ABNORMAL HIGH (ref 7.25–7.43)
pH, Ven: 7.457 — ABNORMAL HIGH (ref 7.25–7.43)
pO2, Ven: 27 mmHg — CL (ref 32–45)
pO2, Ven: 27 mmHg — CL (ref 32–45)

## 2024-02-05 LAB — CBC
HCT: 26.8 % — ABNORMAL LOW (ref 36.0–46.0)
Hemoglobin: 8.4 g/dL — ABNORMAL LOW (ref 12.0–15.0)
MCH: 32.7 pg (ref 26.0–34.0)
MCHC: 31.3 g/dL (ref 30.0–36.0)
MCV: 104.3 fL — ABNORMAL HIGH (ref 80.0–100.0)
Platelets: 202 K/uL (ref 150–400)
RBC: 2.57 MIL/uL — ABNORMAL LOW (ref 3.87–5.11)
RDW: 26.6 % — ABNORMAL HIGH (ref 11.5–15.5)
WBC: 8.8 K/uL (ref 4.0–10.5)
nRBC: 0.3 % — ABNORMAL HIGH (ref 0.0–0.2)

## 2024-02-05 LAB — MAGNESIUM: Magnesium: 2.1 mg/dL (ref 1.7–2.4)

## 2024-02-05 LAB — BASIC METABOLIC PANEL WITH GFR
Anion gap: 12 (ref 5–15)
BUN: 52 mg/dL — ABNORMAL HIGH (ref 8–23)
CO2: 28 mmol/L (ref 22–32)
Calcium: 8.6 mg/dL — ABNORMAL LOW (ref 8.9–10.3)
Chloride: 98 mmol/L (ref 98–111)
Creatinine, Ser: 2.44 mg/dL — ABNORMAL HIGH (ref 0.44–1.00)
GFR, Estimated: 21 mL/min — ABNORMAL LOW (ref >60)
Glucose, Bld: 105 mg/dL — ABNORMAL HIGH (ref 70–99)
Potassium: 3.6 mmol/L (ref 3.5–5.1)
Sodium: 138 mmol/L (ref 135–145)

## 2024-02-05 SURGERY — RIGHT HEART CATH
Anesthesia: LOCAL

## 2024-02-05 MED ORDER — SODIUM CHLORIDE 0.9 % IV SOLN: INTRAVENOUS | Status: AC

## 2024-02-05 MED ORDER — MIDAZOLAM HCL 2 MG/2ML IJ SOLN
INTRAMUSCULAR | Status: AC
  Filled 2024-02-05: qty 2

## 2024-02-05 MED ORDER — SODIUM CHLORIDE 0.9% FLUSH
3.0000 mL | Freq: Two times a day (BID) | INTRAVENOUS | Status: DC
  Administered 2024-02-05 – 2024-02-07 (×5): 3 mL via INTRAVENOUS

## 2024-02-05 MED ORDER — HEPARIN (PORCINE) IN NACL 1000-0.9 UT/500ML-% IV SOLN
INTRAVENOUS | Status: DC | PRN
  Administered 2024-02-05: 500 mL

## 2024-02-05 MED ORDER — MIDAZOLAM HCL (PF) 2 MG/2ML IJ SOLN
INTRAMUSCULAR | Status: DC | PRN
  Administered 2024-02-05: 1 mg via INTRAVENOUS

## 2024-02-05 MED ORDER — CEFAZOLIN SODIUM-DEXTROSE 1-4 GM/50ML-% IV SOLN
1.0000 g | Freq: Two times a day (BID) | INTRAVENOUS | Status: DC
  Administered 2024-02-05 – 2024-02-07 (×5): 1 g via INTRAVENOUS
  Filled 2024-02-05 (×6): qty 50

## 2024-02-05 MED ORDER — HYDRALAZINE HCL 20 MG/ML IJ SOLN: 10.0000 mg | INTRAMUSCULAR | Status: AC | PRN

## 2024-02-05 MED ORDER — LIDOCAINE HCL (PF) 1 % IJ SOLN
INTRAMUSCULAR | Status: AC
  Filled 2024-02-05: qty 30

## 2024-02-05 MED ORDER — SODIUM CHLORIDE 0.9 % IV SOLN: 3.0000 g | Freq: Two times a day (BID) | INTRAVENOUS | Status: DC

## 2024-02-05 SURGICAL SUPPLY — 9 items
CATH SWAN GANZ 7F STRAIGHT (CATHETERS) IMPLANT
KIT MICROPUNCTURE NIT STIFF (SHEATH) IMPLANT
PACK CARDIAC CATHETERIZATION (CUSTOM PROCEDURE TRAY) ×1 IMPLANT
SHEATH GLIDE SLENDER 4/5FR (SHEATH) IMPLANT
SHEATH PINNACLE 7F 10CM (SHEATH) IMPLANT
SHEATH PROBE COVER 6X72 (BAG) IMPLANT
TRANSDUCER W/STOPCOCK (MISCELLANEOUS) IMPLANT
TUBING ART PRESS 72 MALE/FEM (TUBING) IMPLANT
WIRE MICRO SET 5FR 12 (WIRE) IMPLANT

## 2024-02-05 NOTE — Progress Notes (Signed)
 Physical Therapy Treatment Patient Details Name: Tracy Baker MRN: [id number] DOB: 05/31/1955 Today's Date: 02/05/2024   History of Present Illness 68 y.o. female adm 12/29/2023 after leaving AMA from [facility] (where she was on vacation due to lack of formal transfer) where she was being treated for C. Albicans candidemia. 10/28 TEE with mitral valve vegetation. 11/3 teeth Extractions. 11/6 Mitral valve repair. PMHx: thyroid  disease.    PT Comments  Currently pt is continuing to progress towards goals. Pt CGA to supervision for bed mobility, supervision for sit to stand and short distance gait with RW. Occasional cues for sternal precautions. Due to pt current functional status, home set up and available assistance at home recommending skilled physical therapy services > 3 hours/day in order to address strength, balance and functional mobility to decrease risk for falls, injury, immobility, skin break down and re-hospitalization.      If plan is discharge home, recommend the following: Assistance with cooking/housework;Assist for transportation;Help with stairs or ramp for entrance;A little help with walking and/or transfers;A little help with bathing/dressing/bathroom     Equipment Recommendations  Rollator (4 wheels);BSC/3in1       Precautions / Restrictions Precautions Precautions: Fall;Sternal Precaution Booklet Issued: No Recall of Precautions/Restrictions: Impaired Precaution/Restrictions Comments: Reviewed precautions. Restrictions Weight Bearing Restrictions Per Provider Order: No RUE Weight Bearing Per Provider Order: Non weight bearing RUE Partial Weight Bearing Percentage or Pounds: 5 LUE Weight Bearing Per Provider Order: Non weight bearing LUE Partial Weight Bearing Percentage or Pounds: 5 Other Position/Activity Restrictions: Cardiac sternal precautions     Mobility  Bed Mobility Overal bed mobility: Needs Assistance Bed Mobility: Rolling, Sidelying to Sit, Sit to  Sidelying Rolling: Supervision Sidelying to sit: Contact guard assist     Sit to sidelying: Supervision      Transfers Overall transfer level: Needs assistance Equipment used: None, Rolling walker (2 wheels) Transfers: Sit to/from Stand Sit to Stand: Supervision   Step pivot transfers: Supervision       General transfer comment: occasional verbal cues for safe hand placement initially with good carry over. Pt performed 5x during session, 4x from EOB 1x from toilet.    Ambulation/Gait Ambulation/Gait assistance: Supervision Gait Distance (Feet): 30 Feet Assistive device: Rolling walker (2 wheels) Gait Pattern/deviations: Step-through pattern, Decreased stride length Gait velocity: decreased Gait velocity interpretation: 1.31 - 2.62 ft/sec, indicative of limited community ambulator   General Gait Details: declined out in the hall today due to fatigue and nostril packing was bothering her.      Balance Overall balance assessment: Mild deficits observed, not formally tested Sitting-balance support: No upper extremity supported, Feet supported Sitting balance-Leahy Scale: Fair     Standing balance support: Bilateral upper extremity supported, During functional activity, Reliant on assistive device for balance Standing balance-Leahy Scale: Fair Standing balance comment: Rw in standing      Communication Communication Communication: No apparent difficulties  Cognition Arousal: Alert Behavior During Therapy: Flat affect   PT - Cognitive impairments: Safety/Judgement, Memory       PT - Cognition Comments: decreased recall of precautions with education for all Following commands: Intact      Cueing Cueing Techniques: Verbal cues     General Comments General comments (skin integrity, edema, etc.): O2 sats HR stable on room air.      Pertinent Vitals/Pain Pain Assessment Pain Assessment: No/denies pain     PT Goals (current goals can now be found in the care  plan section) Acute Rehab PT Goals Patient Stated Goal: to  return to work PT Goal Formulation: With patient Time For Goal Achievement: 02/11/24 Potential to Achieve Goals: Fair Progress towards PT goals: Progressing toward goals    Frequency    Min 2X/week      PT Plan  Continue with current POC        AM-PAC PT 6 Clicks Mobility   Outcome Measure  Help needed turning from your back to your side while in a flat bed without using bedrails?: A Little Help needed moving from lying on your back to sitting on the side of a flat bed without using bedrails?: A Little Help needed moving to and from a bed to a chair (including a wheelchair)?: A Little Help needed standing up from a chair using your arms (e.g., wheelchair or bedside chair)?: A Little Help needed to walk in hospital room?: A Little Help needed climbing 3-5 steps with a railing? : A Little 6 Click Score: 18    End of Session Equipment Utilized During Treatment: Gait belt Activity Tolerance: Patient tolerated treatment well Patient left: in bed;with call bell/phone within reach Nurse Communication: Mobility status PT Visit Diagnosis: Other abnormalities of gait and mobility (R26.89);Muscle weakness (generalized) (M62.81);Difficulty in walking, not elsewhere classified (R26.2)     Time: 8552-8497 PT Time Calculation (min) (ACUTE ONLY): 15 min  Charges:    $Therapeutic Activity: 8-22 mins PT General Charges $$ ACUTE PT VISIT: 1 Visit                     Dorothyann Maier, DPT, CLT  Acute Rehabilitation Services Office: [phone] (Secure chat preferred)    Dorothyann VEAR Maier 02/05/2024, 3:31 PM

## 2024-02-05 NOTE — Progress Notes (Signed)
 Patient ID: Tracy Baker, female   DOB: 1955/05/31, 68 y.o.   MRN: [id number]     Advanced Heart Failure Rounding Note  Cardiologist: Lonni Cash, MD  Chief Complaint: S/p MV Repair / MV endocarditis   Patient Profile   Tracy Baker is a 68 y.o. female with hx of recent nephrolithiasis s/p ureteral stent and lithotripsy at [facility] 10/10 with stent removal. 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 Post-op course c/b post cardiotomy shock, junctional bradycardia, afib w/ RVR and HAP.   Subjective:    RHC today (on 0.125 of Milrinone ) HEMODYNAMICS: RA:       4 mmHg (mean) RV:       42/2, 6 mmHg PA:       42/12 mmHg (22 mean) PCWP: 12 mmHg (mean) with v waves to 20                                      Estimated Fick CO/CI   5.54L/min, 3.33L/min/m2       Thermodilution CO/CI   3.37L/min, 2.03L/min/m2                                      TPG  10  mmHg                                               PVR  2.9 Wood Units  PAPi  7.5    Feels ok. Sitting up in bed eating breakfast. No resting dyspnea. Had nosebleed overnight. S/p nasal packing. Hgb 9.4>>8.4.    Objective:    Weight Range: 61.4 kg Body mass index is 23.24 kg/m.   Vital Signs:   Temp:  [97.6 F (36.4 C)-97.8 F (36.6 C)] 97.7 F (36.5 C) (12/02 0853) Pulse Rate:  [0-94] 76 (12/02 0853) Resp:  [10-21] 16 (12/02 0853) BP: (104-142)/(53-76) 126/65 (12/02 0853) SpO2:  [82 %-96 %] 95 % (12/02 0853) Weight:  [61.4 kg] 61.4 kg (12/02 0432) Last BM Date : 02/02/24  Weight change: Filed Weights   02/03/24 0553 02/04/24 0640 02/05/24 0432  Weight: 60.4 kg 61.9 kg 61.4 kg   Intake/Output:  Intake/Output Summary (Last 24 hours) at 02/05/2024 1056 Last data filed at 02/05/2024 0436 Gross per 24 hour  Intake 120 ml  Output 400 ml  Net -280 ml    Physical Exam   General: fatigued appearing. No distress  Cardiac: JVD not elevated, 3/6 MR murmur  Resp: decreased BS at the  bases bilaterally  Abdomen: soft, NT, ND  Extremities: warm and dry. No LEE  Neuro:A&Ox 3. No FND. Affect pleasant   Telemetry   NSR 70s (personally reviewed)  Labs    CBC Recent Labs    02/04/24 0355 02/05/24 0420  WBC 12.5* 8.8  HGB 9.4* 8.4*  HCT 29.4* 26.8*  MCV 103.2* 104.3*  PLT 235 202   Basic Metabolic Panel Recent Labs    87/98/74 0355 02/04/24 1257 02/05/24 0420  NA 136 139 138  K 4.6 5.0 3.6  CL 100 100 98  CO2 23 25 28   GLUCOSE 168* 107* 105*  BUN 43* 47* 52*  CREATININE 2.12* 2.46* 2.44*  CALCIUM  9.1 9.0 8.6*  MG 2.2  --  2.1   Liver Function Tests No results for input(s): AST, ALT, ALKPHOS, BILITOT, PROT, ALBUMIN  in the last 72 hours.  Medications:    Scheduled Medications:  amiodarone   200 mg Oral BID   Chlorhexidine  Gluconate Cloth  6 each Topical Daily   docusate sodium   200 mg Oral Daily   feeding supplement  237 mL Oral BID BM   hydrALAZINE   20 mg Oral TID   mirtazapine   15 mg Oral QHS   mupirocin ointment   Nasal BID   pantoprazole   40 mg Oral Daily   sodium chloride   2 spray Each Nare QID   sodium chloride  flush  10-40 mL Intracatheter Q12H   sodium chloride  flush  3 mL Intravenous Q12H   thiamine   100 mg Oral Daily    Infusions:  sodium chloride  100 mL/hr at 02/05/24 1019   fluconazole  (DIFLUCAN ) IV 400 mg (02/05/24 1023)   milrinone  0.125 mcg/kg/min (02/04/24 1517)    PRN Medications: bisacodyl  **OR** [DISCONTINUED] bisacodyl , clonazePAM , hydrALAZINE , magic mouthwash, menthol , ondansetron  (ZOFRAN ) IV, mouth rinse, sodium chloride , sodium chloride  flush  Assessment/Plan   1. MV Endocarditis d/t Candida Albicans candidemia/fungemia, S/p MV repair w/ Post-cardiotomy Shock  - s/p MV repair 01/10/24 by Dr. Kerrin for Candida endocarditis.  - Pre-op L/RHC 01/04/24 with no CAD and normal L/R heart pressures.  - Intra-op TEE LVEF 60-65%, RV mildly reduced, small immobile mass on anterior valve leaflet, mod MR,  trivial TR - Failed initial Milrinone  wean. Echo 11/13 per Dr. Rolan review: EF 60-65%, moderate RV dysfunction, s/p MV repair with probably moderate mitral regurgitation.  Milrinone  restarted.  -TEE done 11/18  LV EF 60-65%, mild RV dysfunction, severe mitral regurgitation of repaired valve.   - Milrinone  restarted yesterday, 0.125 for low co-ox/worsening SCr - RHC today on 0.125 of milrinone  demonstrated normal right and left heart filling pressures (RA 4, PCW 12), evidence of significant MR by PCWP tracing and moderately reduced output by thermodilution 2.03, 3.3 by FICK calculation.  - continue milrinone  0.125 - hold diuretics today  - off digoxin  w/ elevated level/AKI  - Continue hydralazine  20 mg tid for afterload reduction - Off spiro with fluctant Cr - Discussed TEE results with structural heart service (Dr. Wonda), does not appear to be a good mTEER candidate.  - She is going to need eventual redo mitral valve surgery for mitral valve replacement, timing is uncertain.  Discussed with Dr. Kerrin, would like more time to recover from her recent MV repair (PT, nutrition). She will need SNF vs HH PT then return for redo surgery in a few weeks.Insurance denied CIR.   2. Transient Junctional Nola  - Now in NSR - monitor w/ amiodarone   - stable  3. Atrial fibrillation, post-op - back in NSR. Rate controlled  - Continue PO amio 200 mg bid  - Currently holding apixaban  given epistaxis  - no change  4. AKI on CKD 3b - Scr baseline 1.4-1.5 - have been holding torsemide  and spiro - Cr bumped 1.47>2.12>2.44 overnight.  - CI 3.3 by FICK. Continue milrinone  support - hold diuretics today given normal filling pressures on RHC   5. Protein-calorie malnutrition - encourage po intake. Appetite starting to improve. Keep off scopolamine  patch which caused extreme dry mouth. - continue Ensure  6. Epistaxis  - s/p bilateral nasal packing per ENT 12/1   - not on empiric abx for TSS  prophylaxis. Given fresh valve will start ancef  (d/w PharmD)  Length of stay: [address] PA-C  10:56 AM  Advanced Heart Failure Team Pager [phone] (M-F; 7a - 5p)  Please contact [facility] for night-coverage after hours (4p -7a ) and weekends on [email or web address]

## 2024-02-05 NOTE — Plan of Care (Signed)
  Problem: Education: Goal: Knowledge of General Education information will improve Description: Including pain rating scale, medication(s)/side effects and non-pharmacologic comfort measures Outcome: Progressing   Problem: Clinical Measurements: Goal: Will remain free from infection Outcome: Progressing   Problem: Activity: Goal: Risk for activity intolerance will decrease Outcome: Progressing   Problem: Elimination: Goal: Will not experience complications related to bowel motility Outcome: Progressing Goal: Will not experience complications related to urinary retention Outcome: Progressing   Problem: Pain Managment: Goal: General experience of comfort will improve and/or be controlled Outcome: Progressing   Problem: Safety: Goal: Ability to remain free from injury will improve Outcome: Progressing   Problem: Skin Integrity: Goal: Risk for impaired skin integrity will decrease Outcome: Progressing

## 2024-02-05 NOTE — Progress Notes (Signed)
 [address] [place] [employer] [address]             [phone]      14 Days Post-Op Procedure(s) (LRB): TRANSESOPHAGEAL ECHOCARDIOGRAM (N/A) CARDIOVERSION (N/A) Subjective: Patient reports she feels better this AM. She was not able to take her medications yesterday due to nausea and vomiting.   Objective: Vital signs in last 24 hours: Temp:  [97.4 F (36.3 C)-97.8 F (36.6 C)] 97.8 F (36.6 C) (12/02 0432) Pulse Rate:  [69-75] 72 (12/02 0432) Cardiac Rhythm: Normal sinus rhythm;Bundle branch block (12/01 1900) Resp:  [14-23] 14 (12/02 0432) BP: (104-136)/(53-76) 117/53 (12/02 0432) SpO2:  [90 %-96 %] 91 % (12/02 0432) Weight:  [61.4 kg] 61.4 kg (12/02 0432)  Hemodynamic parameters for last 24 hours: CVP:  [4 mmHg-6 mmHg] 6 mmHg  Intake/Output from previous day: 12/01 0701 - 12/02 0700 In: 120 [P.O.:120] Out: 400 [Urine:400] Intake/Output this shift: No intake/output data recorded.  General appearance: alert, cooperative, and no distress Neurologic: intact Heart: regular rate and rhythm, S1, S2 normal, no murmur, click, rub or gallop Lungs: diminished bibasilar breath sounds Abdomen: soft, non-tender; bowel sounds normal; no masses,  no organomegaly Extremities: edema trace BLE Wound: Clean and dry without sign of infection  Lab Results: Recent Labs    02/04/24 0355 02/05/24 0420  WBC 12.5* 8.8  HGB 9.4* 8.4*  HCT 29.4* 26.8*  PLT 235 202   BMET:  Recent Labs    02/04/24 1257 02/05/24 0420  NA 139 138  K 5.0 3.6  CL 100 98  CO2 25 28  GLUCOSE 107* 105*  BUN 47* 52*  CREATININE 2.46* 2.44*  CALCIUM  9.0 8.6*    PT/INR: No results for input(s): LABPROT, INR in the last 72 hours. ABG    Component Value Date/Time   PHART 7.387 01/11/2024 0856   HCO3 19.3 (L) 01/11/2024 0856   TCO2 20 (L) 01/11/2024 0856   ACIDBASEDEF 5.0 (H) 01/11/2024 0856   O2SAT 55.3 02/04/2024 1117   CBG (last 3)  No results for input(s): GLUCAP  in the last 72 hours.  Assessment/Plan: S/P Procedure(s) (LRB): TRANSESOPHAGEAL ECHOCARDIOGRAM (N/A) CARDIOVERSION (N/A)  ENT: Recurrent epistaxis, ENT consulted and packed nose with absorbable packing yesterday. Cleared to restart Eliquis  if needed. Dry mouth, magic mouthwash, biotene dry mouth rinse and lozenges started with some improvement but worse this AM.   CV: Postop afib, NSR HR 70s this AM. On Amiodarone  200mg  BID, and Digoxin  0.0625 every other day. Attempted DCCV on 11/18, aborted due to severe mitral regurgitation. She will need redo mitral valve replacement eventually. AHF following, on Spironolactone  and hydralazine , milrinone  0.125 restarted yesterday. Right heart cath planned for today. BP stable, SBP mostly 110s-130s.    Pulm: Saturating 91-95% on RA this AM. CXR yesterday with small bilateral pleural effusions and improved bibasilar atelectasis, some interstitial markings. Encourage IS and ambulation.    GI: Severe malnutrition, poor PO intake. Was not able to eat or take medications yesterday without nausea and vomiting. Patient reports she feels better today and Zofran  helped. Continue Zofran  PRN. Encourage increased PO intake. +BM.    Renal: Cr 2.44, bumped from 2.12 yesterday AM. UO 400cc/24hrs recorded with 1 dose IV Lasix  yesterday. Under preop weight, -1lb from yesterday. Diuresis per AHF team.   ID: Candidemia/fungal endocarditis, continue Fluconazole . No leukocytosis, afebrile.    DVT Prophylaxis: SCDs, ambulation   Deconditioning: Insurance denied CIR, plan will be SNF vs  HH PT/OT. Patient only ambulating about 90ft, I think SNF will likely be better option. Continue to work with PT/OT.    Dispo: Patient will need SNF vs HH at discharge, very frustrated insurance denied CIR. Plan is for patient to return in about 3 weeks once she has recovered further from surgery for redo surgery but ultimately patient may require redo surgery during this hospitalization.       LOS: 34 days    [name] Bend, [place] 02/05/2024

## 2024-02-05 NOTE — Progress Notes (Signed)
 PT Cancellation Note  Patient Details Name: Verlon Carcione MRN: [id number] DOB: 1956-01-31   Cancelled Treatment:    Reason Eval/Treat Not Completed: (P) Patient not medically ready (pt on bed rest 1 hour after RHC (IJ access so no TR band).) Will continue efforts per PT plan of care as schedule permits.   Michiah Mudry M Dustyn Dansereau 02/05/2024, 10:08 AM

## 2024-02-05 NOTE — Progress Notes (Addendum)
 Nutrition Follow-up  DOCUMENTATION CODES:   Severe malnutrition in context of acute illness/injury  INTERVENTION:  Continue liberalized diet to maximize PO intake   -Encourage PO intake  -Menu provided to raise awareness of food options available to her   Continue Ensure Plus High Protein po BID, each supplement provides 350 kcal and 20 grams of protein   Increase Magic cup TID with meals, each supplement provides 290 kcal and 9 grams of protein   Continue Biotene Dry Mouth Rinse or equivalent for pt   Discontinue snacks TID between meals to augment PO intake as patient does not prefer  Continue to consider Cortrak placement d/t poor intake and debilitated status   NUTRITION DIAGNOSIS:  Severe Malnutrition related to acute illness as evidenced by moderate muscle depletion, mild muscle depletion, energy intake < or equal to 50% for > or equal to 5 days.  GOAL:  Patient will meet greater than or equal to 90% of their needs  MONITOR:  PO intake, Supplement acceptance, Labs, Weight trends  REASON FOR ASSESSMENT:  Consult Poor PO  ASSESSMENT:   68 yo female admitted with hx of Calbicans candidemia, being treated at [facility] in [place] but left AMA and presents with MV endocarditis requiring MVR, post-op course complicated by shock post-op, junctional bradycardia, afib with RVR and pneumonia. PMH includes nephrolithiasis s/p ureteral stent and lithotripsy  10/29 Admitted 11/06 MV repair 11/18 TEE: EF 60-65%, mild RV dysfunction, severe MR 11/21 calorie count started 11/24 calorie count ended - insufficient intake; txr out of ICU 12/01 N/V noted; refusing antiemetic 12/02 RHC: sig reduced cardiac index likely d/t MR; normal filling pressures noted 12/05 repeat MVR scheduled  Plan to repeat MVR surgery Friday (12/05). Insurance denied CIR. RHC completed this morning with significantly reduced cardiac index despite inotropic support. Current status complicated by new AKI along  with poor nutrition status. Family considering transfer to [facility]. Nosebleed overnight and now s/p nasal packing.    Met with patient at bedside this morning. She reports today was the first day in a long time that she felt like eating breakfast. Consumed cream of wheat and fresh fruit cup, per her report. Noted she refused lunch tray and is documented as patient will call back for dinner meal. She reports still consuming approximately 50% of Ensure offerings, however noted as refusing both today. Will increase Magic Cup offerings to TID.  Average Meal Intake 11/25: 50% x1 documented offering 11/26: 10-50% x2 documented offerings 11/28: 0-50% x2 documented offerings 11/29: 50-60% x2 documented offerings 11/30: 50% x1 documented offering  Noted with N/V yesterday and was refusing medications. Did finally agree and has taken medications this morning with good effect. Reports nausea medication is effective.   She continues with dry mouth. Originally said [employer] was helpful, however endorses this is no longer the case. Biotene dry mouth rinse also at bedside. She has hard candy as well in effort to stimulate saliva production.    No longer prefers the snacks between meals to augment intake and therefore discontinued. She is able to place her order for smaller/lighter meal items to try to encourage intake, however states she does not always know what is available. Provided menu to her to review potential items available to her.   Discussed, again, importance of PO intake to optimize prior to surgery and promote progress with therapy as she endorses feeling frail and weak. Continue to explore need for artificial nutrition support in short term to bridge the calorie/protein deficit observed in  her intake. She continues to refuse this.    Admit Weight: 69.9 kg Current Weight: 61.4 kg Lowest Weight: 58.7 kg on 11/29   UBW around 160 pounds; current wt 135 pounds. While this is not her lowest  weight documented this admission, it is notable compared to admission weight and UBW. Suspect some degree of true weight loss in addition to weight loss from diuresis. Did receive one dose of Lasix  yesterday. Noted with trace peripheral edema.    BUN/Crt trending up. No diuretics in place. Milrinone  re-initiated yesterday due to low co-ox and worsening creatinine levels. Last BM 11/29. Pt reporting refusing bowel regimen recently as she had loose stools around that time. Now accepting.    Labs: Sodium 139 (wdl) Potassium 3.6 (wdl) BUN 52 Creatinine 2.44 CBGs 105-107 x24 hours A1c 5.4 (01/2024)   Meds: Dulcolax prn Colace daily Mirtazapine  Pantoprazole   Thiamine  IV ABX Milrinone  gtt  Diet Order:   Diet Order             Diet regular Room service appropriate? Yes; Fluid consistency: Thin  Diet effective now            EDUCATION NEEDS:  Education needs have been addressed  Skin:  Skin Assessment: Skin Integrity Issues: Skin Integrity Issues:: Incisions Incisions: chest (closed)  Last BM:  11/29  Height:  Ht Readings from Last 1 Encounters:  01/10/24 5' 4 (1.626 m)   Weight:  Wt Readings from Last 1 Encounters:  02/05/24 61.4 kg    Ideal Body Weight:  54.5 kg  BMI:  Body mass index is 23.24 kg/m.  Estimated Nutritional Needs:   Kcal:  1750-1950 kcals  Protein:  80-95 g  Fluid:  1.8 L  Blair Deaner MS, RD, LDN Registered Dietitian Clinical Nutrition RD Inpatient Contact Info in Amion

## 2024-02-05 NOTE — Interval H&P Note (Signed)
 History and Physical Interval Note:  02/05/2024 8:08 AM  Tracy Baker  has presented today for surgery, with the diagnosis of HF.  The various methods of treatment have been discussed with the patient and family. After consideration of risks, benefits and other options for treatment, the patient has consented to  Procedure(s): RIGHT HEART CATH (N/A) as a surgical intervention.  The patient's history has been reviewed, patient examined, no change in status, stable for surgery.  I have reviewed the patient's chart and labs.  Questions were answered to the patient's satisfaction.     [name]

## 2024-02-05 NOTE — Plan of Care (Signed)
  Problem: Education: Goal: Knowledge of General Education information will improve Description: Including pain rating scale, medication(s)/side effects and non-pharmacologic comfort measures Outcome: Progressing   Problem: Health Behavior/Discharge Planning: Goal: Ability to manage health-related needs will improve Outcome: Progressing   Problem: Clinical Measurements: Goal: Ability to maintain clinical measurements within normal limits will improve Outcome: Progressing Goal: Will remain free from infection Outcome: Progressing Goal: Diagnostic test results will improve Outcome: Progressing Goal: Respiratory complications will improve Outcome: Progressing Goal: Cardiovascular complication will be avoided Outcome: Progressing   Problem: Activity: Goal: Risk for activity intolerance will decrease Outcome: Progressing   Problem: Nutrition: Goal: Adequate nutrition will be maintained Outcome: Progressing   Problem: Coping: Goal: Level of anxiety will decrease Outcome: Progressing   Problem: Elimination: Goal: Will not experience complications related to bowel motility Outcome: Progressing Goal: Will not experience complications related to urinary retention Outcome: Progressing   Problem: Pain Managment: Goal: General experience of comfort will improve and/or be controlled Outcome: Progressing   Problem: Safety: Goal: Ability to remain free from injury will improve Outcome: Progressing   Problem: Skin Integrity: Goal: Risk for impaired skin integrity will decrease Outcome: Progressing   Problem: Education: Goal: Understanding of CV disease, CV risk reduction, and recovery process will improve Outcome: Progressing Goal: Individualized Educational Video(s) Outcome: Progressing   Problem: Activity: Goal: Ability to return to baseline activity level will improve Outcome: Progressing   Problem: Cardiovascular: Goal: Ability to achieve and maintain adequate  cardiovascular perfusion will improve Outcome: Progressing Goal: Vascular access site(s) Level 0-1 will be maintained Outcome: Progressing   Problem: Health Behavior/Discharge Planning: Goal: Ability to safely manage health-related needs after discharge will improve Outcome: Progressing   Problem: Education: Goal: Will demonstrate proper wound care and an understanding of methods to prevent future damage Outcome: Progressing Goal: Knowledge of disease or condition will improve Outcome: Progressing Goal: Knowledge of the prescribed therapeutic regimen will improve Outcome: Progressing Goal: Individualized Educational Video(s) Outcome: Progressing   Problem: Activity: Goal: Risk for activity intolerance will decrease Outcome: Progressing

## 2024-02-06 LAB — BASIC METABOLIC PANEL WITH GFR
Anion gap: 8 (ref 5–15)
BUN: 41 mg/dL — ABNORMAL HIGH (ref 8–23)
CO2: 28 mmol/L (ref 22–32)
Calcium: 8.5 mg/dL — ABNORMAL LOW (ref 8.9–10.3)
Chloride: 103 mmol/L (ref 98–111)
Creatinine, Ser: 1.67 mg/dL — ABNORMAL HIGH (ref 0.44–1.00)
GFR, Estimated: 33 mL/min — ABNORMAL LOW (ref >60)
Glucose, Bld: 108 mg/dL — ABNORMAL HIGH (ref 70–99)
Potassium: 3.4 mmol/L — ABNORMAL LOW (ref 3.5–5.1)
Sodium: 139 mmol/L (ref 135–145)

## 2024-02-06 LAB — MAGNESIUM: Magnesium: 2.1 mg/dL (ref 1.7–2.4)

## 2024-02-06 MED ORDER — POTASSIUM CHLORIDE CRYS ER 20 MEQ PO TBCR
40.0000 meq | EXTENDED_RELEASE_TABLET | Freq: Once | ORAL | Status: AC
  Administered 2024-02-06: 40 meq via ORAL
  Filled 2024-02-06: qty 2

## 2024-02-06 MED FILL — Lidocaine HCl Local Preservative Free (PF) Inj 1%: INTRAMUSCULAR | Qty: 30 | Status: AC

## 2024-02-06 NOTE — Progress Notes (Addendum)
 [address] [place] [employer] [address]             [phone]      1 Day Post-Op Procedure(s) (LRB): RIGHT HEART CATH (N/A) Subjective: Patient reports she was able to eat all 3 meals yesterday, nausea improved. She still complains of dry mouth but reports it is a little better than yesterday.   Objective: Vital signs in last 24 hours: Temp:  [97.6 F (36.4 C)-97.8 F (36.6 C)] 97.8 F (36.6 C) (12/03 0418) Pulse Rate:  [0-94] 67 (12/03 0418) Cardiac Rhythm: Normal sinus rhythm (12/03 0744) Resp:  [10-18] 15 (12/03 0418) BP: (106-142)/(53-65) 111/65 (12/03 0418) SpO2:  [82 %-100 %] 91 % (12/03 0418)  Hemodynamic parameters for last 24 hours: CVP:  [4 mmHg-6 mmHg] 4 mmHg  Intake/Output from previous day: 12/02 0701 - 12/03 0700 In: 243 [P.O.:240; I.V.:3] Out: -  Intake/Output this shift: No intake/output data recorded.  General appearance: alert, cooperative, and no distress Neurologic: intact Heart: regular rate and rhythm, S1, S2 normal, no murmur, click, rub or gallop Lungs: diminished bibasilar breath sounds Abdomen: soft, non-tender; bowel sounds normal; no masses,  no organomegaly Extremities: extremities normal, atraumatic, no cyanosis or edema Wound: Clean and dry without sign of infection  Lab Results: Recent Labs    02/04/24 0355 02/05/24 0420 02/05/24 0830 02/05/24 0831  WBC 12.5* 8.8  --   --   HGB 9.4* 8.4* 8.5* 8.5*  HCT 29.4* 26.8* 25.0* 25.0*  PLT 235 202  --   --    BMET:  Recent Labs    02/05/24 0420 02/05/24 0830 02/05/24 0831 02/06/24 0430  NA 138   < > 139 139  K 3.6   < > 3.6 3.4*  CL 98  --   --  103  CO2 28  --   --  28  GLUCOSE 105*  --   --  108*  BUN 52*  --   --  41*  CREATININE 2.44*  --   --  1.67*  CALCIUM  8.6*  --   --  8.5*   < > = values in this interval not displayed.    PT/INR: No results for input(s): LABPROT, INR in the last 72 hours. ABG    Component Value Date/Time   PHART  7.387 01/11/2024 0856   HCO3 27.9 02/05/2024 0831   TCO2 29 02/05/2024 0831   ACIDBASEDEF 5.0 (H) 01/11/2024 0856   O2SAT 53 02/05/2024 0831   CBG (last 3)  No results for input(s): GLUCAP in the last 72 hours.  Assessment/Plan: S/P Procedure(s) (LRB): RIGHT HEART CATH (N/A)  ENT: Recurrent epistaxis, ENT consulted and packed nose with absorbable surgicel packing yesterday. AHF team started Ancef  for TSS prophylaxis. Dry mouth, magic mouthwash, biotene dry mouth rinse and lozenges started with some improvement but worse this AM.   CV: Postop afib, NSR HR 70s this AM. Holding Eliquis  for now with epistaxis since she is in NSR. On Amiodarone  200mg  BID, Digoxin  has been held with low output. Last echo with severe MR, plan for redo surgery Friday.  AHF following, Spironolactone  d/c'd with low output, on hydralazine  and milrinone  0.125. Right heart cath yesterday with normal filling pressure but moderately decreased output. BP stable, SBP mostly 110s.   Pulm: Saturating 91-95% on RA this AM. Last CXR with small bilateral pleural effusions and improved bibasilar atelectasis, some interstitial markings. Encourage IS and ambulation.    GI:  Severe malnutrition, poor PO intake improved yesterday. Continue Zofran  PRN. Encourage increased PO intake. +BM.    Renal: Cr 1.67, improved from 2.44 yesterday. No UO recorded. Under preop weight, -1lb from yesterday. Diuresis per AHF team. K 3.4, supplement.    ID: Candidemia/fungal endocarditis, continue Fluconazole . No leukocytosis, afebrile.    DVT Prophylaxis: SCDs, ambulation   Deconditioning: Insurance denied CIR, but may require redo surgery while still in the hospital. Continue to work with PT/OT. If discharged prior to redo surgery will likely need SNF at discharge.   Dispo: Patient will need SNF vs HH at discharge, very frustrated insurance denied CIR. Plan is for patient to return in about 3 weeks once she has recovered further from surgery for  redo surgery but ultimately patient may require redo surgery during this hospitalization. According to AHF note patient's family wishes to consider transfer to [facility] but patient does not endorse that while in the room today.    LOS: 35 days    [name] Bend, PA-C 02/06/2024 Patient seen and examined earlier.  Now eating dinner.  I had a long discussion with Mrs. Koehl and her son.  We reviewed events of weekend.  I think primary issue was she was dry which contributed to AKI.  Renal function improved with milrinone , IVF and now taking pO better I think we are having to walk to fine of a line between volume depletion and volume overload to be optimistic that she could have a period of several months to recover before MVR.  Therefore, I think best option is to proceed with MVR now.  I have discussed this with her previously.  Wil plan for a tissue valve given age > 11 yo.   She and her family are aware of the general nature of the procedure, the indications, risks, benefits and alternatives.  They understand increased difficulty in redo setting.   She agrees to proceed Plan surgery Friday Remains on Diflucan  Elspeth BROCKS. Kerrin, MD [facility] [phone]

## 2024-02-06 NOTE — Progress Notes (Signed)
 This chaplain responded to NP-Jordan Lee's consult for Pt. emotional support. The chaplain understands the Pt. has had a long admission with additional medical care planned.  The chaplain introduced herself at the Pt. bedside and extended an invitation for a listening presence. The Pt. declined the visit stating her son will be visiting soon.  The chaplain offered F/U spiritual care as needed.  Chaplain Leeroy Hummer [phone]

## 2024-02-06 NOTE — TOC Progression Note (Signed)
 Transition of Care [facility]) - Progression Note    Patient Details  Name: Tracy Baker MRN: [id number] Date of Birth: [birth year]  [facility] CM/SW Contact  Arlana JINNY Nicholaus [place] Phone Number: [phone] 02/06/2024, 3:02 PM  Clinical Narrative:  HF CSW met with patient at bedside to discuss PT recommendation for SNF. Patient is agreeable. Patient inquired about CIR but does not have 24 hour support at home. CSW will fax the patient out closer to being medically ready for dc.   HF CSW/CM will continue to follow and monitor for dc readiness.      Expected Discharge Plan: Skilled Nursing Facility Barriers to Discharge: Continued Medical Work up               Expected Discharge Plan and Services   Discharge Planning Services: CM Consult Post Acute Care Choice: Skilled Nursing Facility Living arrangements for the past 2 months: Apartment                                       Social Drivers of Health (SDOH) Interventions SDOH Screenings   Food Insecurity: No Food Insecurity (12/29/2023)  Housing: Low Risk  (12/29/2023)  Transportation Needs: No Transportation Needs (12/29/2023)  Utilities: Not At Risk (12/29/2023)  Alcohol Screen: Low Risk  (06/08/2022)  Depression (PHQ2-9): Low Risk  (11/08/2023)  Social Connections: Socially Isolated (12/29/2023)  Tobacco Use: High Risk (01/10/2024)    Readmission Risk Interventions     No data to display

## 2024-02-06 NOTE — Progress Notes (Signed)
 Patient ID: Tracy Baker, female   DOB: [birth year], 68 y.o.   MRN: [id number]     Advanced Heart Failure Rounding Note  Cardiologist: Lonni Cash, MD  Chief Complaint: S/p MV Repair / MV endocarditis  Patient Profile   Tracy Baker is a 68 y.o. female with hx of recent nephrolithiasis s/p ureteral stent and lithotripsy at [facility] 10/10 with stent removal. 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 Post-op course c/b post cardiotomy shock, junctional bradycardia, afib w/ RVR and HAP.   Subjective:    RHC today (on 0.125 of Milrinone ): RA 4, PA 42/13 (22), PCW 12 with Vwaves to 20, CO/CI TD 3.37/2.03, PAPi 7.5  Co-ox pending, on milrinone  0.125, CVP 5 sCr improving 2.46>2.44>1.67  Feeling better this morning. Nausea has improved, was able to eat 2 meals. No shortness of breath.   Objective:    Weight Range: 61.4 kg Body mass index is 23.24 kg/m.   Vital Signs:   Temp:  [97.6 F (36.4 C)-97.8 F (36.6 C)] 97.8 F (36.6 C) (12/03 0855) Pulse Rate:  [67-75] 75 (12/03 0855) Resp:  [12-17] 16 (12/03 0855) BP: (106-123)/(53-67) 123/67 (12/03 0855) SpO2:  [91 %-100 %] 96 % (12/03 0855) Last BM Date : 02/02/24  Weight change: Filed Weights   02/03/24 0553 02/04/24 0640 02/05/24 0432  Weight: 60.4 kg 61.9 kg 61.4 kg   Intake/Output:  Intake/Output Summary (Last 24 hours) at 02/06/2024 0921 Last data filed at 02/06/2024 0857 Gross per 24 hour  Intake 763.93 ml  Output --  Net 763.93 ml    Physical Exam   General: Elderly, pale appearing. No distress  Cardiac: S1 and S2. No murmurs  Extremities: Warm and dry.  No edema.  Neuro: A&O x3. Affect pleasant.   Telemetry   SR 60s (personally reviewed)  Labs    CBC Recent Labs    02/04/24 0355 02/05/24 0420 02/05/24 0830 02/05/24 0831  WBC 12.5* 8.8  --   --   HGB 9.4* 8.4* 8.5* 8.5*  HCT 29.4* 26.8* 25.0* 25.0*  MCV 103.2* 104.3*  --   --   PLT 235 202  --   --    Basic  Metabolic Panel Recent Labs    87/97/74 0420 02/05/24 0830 02/05/24 0831 02/06/24 0430  NA 138   < > 139 139  K 3.6   < > 3.6 3.4*  CL 98  --   --  103  CO2 28  --   --  28  GLUCOSE 105*  --   --  108*  BUN 52*  --   --  41*  CREATININE 2.44*  --   --  1.67*  CALCIUM  8.6*  --   --  8.5*  MG 2.1  --   --  2.1   < > = values in this interval not displayed.   Liver Function Tests No results for input(s): AST, ALT, ALKPHOS, BILITOT, PROT, ALBUMIN  in the last 72 hours.  Medications:    Scheduled Medications:  amiodarone   200 mg Oral BID   Chlorhexidine  Gluconate Cloth  6 each Topical Daily   docusate sodium   200 mg Oral Daily   feeding supplement  237 mL Oral BID BM   hydrALAZINE   20 mg Oral TID   mirtazapine   15 mg Oral QHS   mupirocin ointment   Nasal BID   pantoprazole   40 mg Oral Daily   sodium chloride   2 spray Each Nare QID   sodium  chloride flush  10-40 mL Intracatheter Q12H   sodium chloride  flush  3 mL Intravenous Q12H   thiamine   100 mg Oral Daily    Infusions:   ceFAZolin  (ANCEF ) IV Stopped (02/05/24 2247)   fluconazole  (DIFLUCAN ) IV 400 mg (02/05/24 1023)   milrinone  0.125 mcg/kg/min (02/06/24 0811)    PRN Medications: bisacodyl  **OR** [DISCONTINUED] bisacodyl , clonazePAM , magic mouthwash, menthol , ondansetron  (ZOFRAN ) IV, mouth rinse, sodium chloride , sodium chloride  flush  Assessment/Plan   1. MV Endocarditis d/t Candida Albicans candidemia/fungemia, S/p MV repair w/ Post-cardiotomy Shock  - s/p MV repair 01/10/24 by Dr. Kerrin for Candida endocarditis.  - Pre-op L/RHC 01/04/24 with no CAD and normal L/R heart pressures.  - Intra-op TEE LVEF 60-65%, RV mildly reduced, small immobile mass on anterior valve leaflet, mod MR, trivial TR - Failed initial Milrinone  wean. Echo 11/13 per Dr. Rolan review: EF 60-65%, moderate RV dysfunction, s/p MV repair with probably moderate mitral regurgitation. Milrinone  restarted.  - TEE 11/18  LV EF  60-65%, mild RV dysfunction, severe mitral regurgitation of repaired valve.   - Low output symptoms, co-ox 48%, AKI. Milrinone  restarted 12/1. - RHC 12/2 (on 0.125 of milrinone ): demonstrated normal right and left heart filling pressures (RA 4, PCW 12), evidence of significant MR by PCWP tracing and moderately reduced output by thermodilution 2.03, 3.3 by FICK calculation.  - continue milrinone  0.125 - CVP 5. Hold diuretics today  - off digoxin  w/ elevated level/AKI  - Continue hydralazine  20 mg tid for afterload reduction - Off spiro with fluctant Cr - Discussed TEE results with structural heart service (Dr. Wonda), does not appear to be a good mTEER candidate.  - Re-eval'd by TCTS. She is going to undergo re-do mitral valve surgery on Friday.  2. Transient Junctional Brady  - Remains in NSR - monitor w/ amiodarone    3. Atrial fibrillation, post-op - back in NSR. Rate controlled  - Continue PO amio 200 mg bid  - Currently holding apixaban  given epistaxis  - no change  4. AKI on CKD 3b - Scr baseline 1.4-1.5 - have been holding torsemide  and spiro - Cr bumped 2.46, improved with milrinone   5. Protein-calorie malnutrition - encourage po intake. Appetite starting to improve. Keep off scopolamine  patch which caused extreme dry mouth. - continue Ensure  6. Epistaxis  - s/p bilateral nasal packing per ENT 12/1   - not on empiric abx for TSS prophylaxis. Given fresh valve will start ancef  (d/w PharmD)   - sx holding eliquis   Length of stay: 35  Clora Ohmer, NP  9:21 AM  Advanced Heart Failure Team Pager [phone] (M-F; 7a - 5p)  Please contact [facility] for night-coverage after hours (4p -7a ) and weekends on [email or web address]

## 2024-02-06 NOTE — Plan of Care (Signed)
  Problem: Education: Goal: Knowledge of General Education information will improve Description: Including pain rating scale, medication(s)/side effects and non-pharmacologic comfort measures Outcome: Progressing   Problem: Clinical Measurements: Goal: Cardiovascular complication will be avoided Outcome: Progressing   Problem: Activity: Goal: Risk for activity intolerance will decrease Outcome: Progressing   Problem: Safety: Goal: Ability to remain free from injury will improve Outcome: Progressing   

## 2024-02-06 NOTE — Progress Notes (Signed)
 Occupational Therapy Treatment Patient Details Name: Tracy Baker MRN: [id number] DOB: 17-Dec-1955 Today's Date: 02/06/2024   History of present illness 68 y.o. female adm 12/29/2023 after leaving AMA from [facility] (where she was on vacation due to lack of formal transfer) where she was being treated for C. Albicans candidemia. 10/28 TEE with mitral valve vegetation. 11/3 teeth Extractions. 11/6 Mitral valve repair. Plan for redo sternotomy 12/5.  PMHx: thyroid  disease.   OT comments  Pt slowly progressing toward goals this session, able to stand for sinkside ADL and to take meds with RN. Pt needing x1 seated rest break prior to hall ambulation. Min cues overall for sternal precautions, pt ambulating short hall distance with mild LOB x1 but able to self correct. Pt overall fatigued, pt with plan for surgery Friday 12/5, pt ready to get it over with. Will follow up post surgery for likely re-evaluation. Pt presenting with impairments listed below, will follow acutely. Patient will benefit from intensive inpatient follow-up therapy, >3 hours/day to maximize safety/ind with ADL/functional mobility.       If plan is discharge home, recommend the following:  A lot of help with bathing/dressing/bathroom;A lot of help with walking and/or transfers;Assist for transportation;Assistance with cooking/housework   Equipment Recommendations  Tub/shower seat    Recommendations for Other Services Rehab consult;PT consult    Precautions / Restrictions Precautions Precautions: Fall;Sternal Precaution Booklet Issued: No Recall of Precautions/Restrictions: Impaired Precaution/Restrictions Comments: Reviewed precautions.       Mobility Bed Mobility Overal bed mobility: Needs Assistance Bed Mobility: Rolling, Sidelying to Sit, Sit to Sidelying Rolling: Supervision Sidelying to sit: Supervision     Sit to sidelying: Supervision General bed mobility comments: uses heart pillow    Transfers Overall  transfer level: Needs assistance Equipment used: Rolling walker (2 wheels) Transfers: Sit to/from Stand Sit to Stand: Supervision                 Balance Overall balance assessment: Mild deficits observed, not formally tested Sitting-balance support: No upper extremity supported, Feet supported Sitting balance-Leahy Scale: Good     Standing balance support: Bilateral upper extremity supported, During functional activity, Reliant on assistive device for balance Standing balance-Leahy Scale: Fair Standing balance comment: Rw in standing                           ADL either performed or assessed with clinical judgement   ADL Overall ADL's : Needs assistance/impaired     Grooming: Oral care;Set up;Standing           Upper Body Dressing : Minimal assistance;Standing;Sitting       Toilet Transfer: Contact guard assist;Ambulation;Rolling walker (2 wheels);Regular Toilet           Functional mobility during ADLs: Contact guard assist;Rolling walker (2 wheels)      Extremity/Trunk Assessment Upper Extremity Assessment Upper Extremity Assessment: Generalized weakness   Lower Extremity Assessment Lower Extremity Assessment: Defer to PT evaluation        Vision   Vision Assessment?: No apparent visual deficits   Perception Perception Perception: Not tested   Praxis Praxis Praxis: Not tested   Communication Communication Communication: No apparent difficulties   Cognition Arousal: Alert Behavior During Therapy: Flat affect               OT - Cognition Comments: min cues not to push with BUE for sternal prec  Following commands: Intact Following commands impaired: Only follows one step commands consistently      Cueing   Cueing Techniques: Verbal cues  Exercises      Shoulder Instructions       General Comments VSS on RA    Pertinent Vitals/ Pain       Pain Assessment Pain Assessment: No/denies  pain  Home Living                                          Prior Functioning/Environment              Frequency  Min 2X/week        Progress Toward Goals  OT Goals(current goals can now be found in the care plan section)  Progress towards OT goals: Progressing toward goals  Acute Rehab OT Goals Patient Stated Goal: none stated OT Goal Formulation: With patient Time For Goal Achievement: 02/11/24 Potential to Achieve Goals: Good ADL Goals Pt Will Perform Grooming: with supervision;standing Pt Will Perform Upper Body Bathing: with supervision;sitting Pt Will Perform Lower Body Bathing: with min assist;sit to/from stand Pt Will Perform Upper Body Dressing: with supervision;sitting Pt Will Perform Lower Body Dressing: with supervision;sit to/from stand;sitting/lateral leans Pt Will Transfer to Toilet: with supervision;ambulating;regular height toilet Pt Will Perform Tub/Shower Transfer: Tub transfer;Shower transfer;with supervision;ambulating;rolling walker Additional ADL Goal #1: pt will tolerate OOB standing activity x15 min in order to improve activity tolerance for ADLs/IADLs  Plan      Co-evaluation                 AM-PAC OT 6 Clicks Daily Activity     Outcome Measure   Help from another person eating meals?: None Help from another person taking care of personal grooming?: A Little Help from another person toileting, which includes using toliet, bedpan, or urinal?: A Little Help from another person bathing (including washing, rinsing, drying)?: A Lot Help from another person to put on and taking off regular upper body clothing?: A Little Help from another person to put on and taking off regular lower body clothing?: A Lot 6 Click Score: 17    End of Session Equipment Utilized During Treatment: Rolling walker (2 wheels)  OT Visit Diagnosis: Unsteadiness on feet (R26.81);Muscle weakness (generalized) (M62.81)   Activity Tolerance  Patient tolerated treatment well   Patient Left in bed;with call bell/phone within reach;with chair alarm set   Nurse Communication Mobility status        Time: 9091-9067 OT Time Calculation (min): 24 min  Charges: OT General Charges $OT Visit: 1 Visit OT Treatments $Self Care/Home Management : 8-22 mins $Therapeutic Activity: 8-22 mins  Tracy Baker, OTD, OTR/L SecureChat Preferred Acute Rehab [phone]   Tracy Baker 02/06/2024, 11:46 AM

## 2024-02-06 NOTE — Plan of Care (Signed)
 Problem: Education: Goal: Knowledge of General Education information will improve Description: Including pain rating scale, medication(s)/side effects and non-pharmacologic comfort measures Outcome: Progressing   Problem: Health Behavior/Discharge Planning: Goal: Ability to manage health-related needs will improve Outcome: Progressing   Problem: Clinical Measurements: Goal: Ability to maintain clinical measurements within normal limits will improve Outcome: Progressing Goal: Will remain free from infection Outcome: Progressing Goal: Diagnostic test results will improve Outcome: Not Progressing Goal: Respiratory complications will improve Outcome: Progressing Goal: Cardiovascular complication will be avoided Outcome: Not Progressing   Problem: Activity: Goal: Risk for activity intolerance will decrease Outcome: Not Progressing   Problem: Nutrition: Goal: Adequate nutrition will be maintained Outcome: Not Progressing   Problem: Coping: Goal: Level of anxiety will decrease Outcome: Progressing   Problem: Elimination: Goal: Will not experience complications related to bowel motility Outcome: Progressing Goal: Will not experience complications related to urinary retention Outcome: Progressing   Problem: Pain Managment: Goal: General experience of comfort will improve and/or be controlled Outcome: Progressing   Problem: Safety: Goal: Ability to remain free from injury will improve Outcome: Progressing   Problem: Skin Integrity: Goal: Risk for impaired skin integrity will decrease Outcome: Progressing   Problem: Education: Goal: Understanding of CV disease, CV risk reduction, and recovery process will improve Outcome: Progressing Goal: Individualized Educational Video(s) Outcome: Progressing   Problem: Activity: Goal: Ability to return to baseline activity level will improve Outcome: Progressing   Problem: Cardiovascular: Goal: Ability to achieve and maintain  adequate cardiovascular perfusion will improve Outcome: Progressing Goal: Vascular access site(s) Level 0-1 will be maintained Outcome: Progressing   Problem: Health Behavior/Discharge Planning: Goal: Ability to safely manage health-related needs after discharge will improve Outcome: Progressing   Problem: Education: Goal: Will demonstrate proper wound care and an understanding of methods to prevent future damage Outcome: Progressing Goal: Knowledge of disease or condition will improve Outcome: Progressing Goal: Knowledge of the prescribed therapeutic regimen will improve Outcome: Progressing Goal: Individualized Educational Video(s) Outcome: Progressing   Problem: Activity: Goal: Risk for activity intolerance will decrease Outcome: Progressing   Problem: Cardiac: Goal: Will achieve and/or maintain hemodynamic stability Outcome: Progressing   Problem: Clinical Measurements: Goal: Postoperative complications will be avoided or minimized Outcome: Progressing   Problem: Respiratory: Goal: Respiratory status will improve Outcome: Progressing   Problem: Skin Integrity: Goal: Wound healing without signs and symptoms of infection Outcome: Progressing Goal: Risk for impaired skin integrity will decrease Outcome: Progressing   Problem: Urinary Elimination: Goal: Ability to achieve and maintain adequate renal perfusion and functioning will improve Outcome: Progressing   Problem: Education: Goal: Ability to describe self-care measures that may prevent or decrease complications (Diabetes Survival Skills Education) will improve Outcome: Progressing Goal: Individualized Educational Video(s) Outcome: Progressing   Problem: Coping: Goal: Ability to adjust to condition or change in health will improve Outcome: Progressing   Problem: Fluid Volume: Goal: Ability to maintain a balanced intake and output will improve Outcome: Progressing   Problem: Health Behavior/Discharge  Planning: Goal: Ability to identify and utilize available resources and services will improve Outcome: Progressing Goal: Ability to manage health-related needs will improve Outcome: Progressing   Problem: Metabolic: Goal: Ability to maintain appropriate glucose levels will improve Outcome: Progressing   Problem: Nutritional: Goal: Maintenance of adequate nutrition will improve Outcome: Progressing Goal: Progress toward achieving an optimal weight will improve Outcome: Progressing   Problem: Skin Integrity: Goal: Risk for impaired skin integrity will decrease Outcome: Progressing   Problem: Tissue Perfusion: Goal: Adequacy of tissue perfusion will improve  Outcome: Progressing

## 2024-02-07 LAB — BASIC METABOLIC PANEL WITH GFR
Anion gap: 7 (ref 5–15)
BUN: 30 mg/dL — ABNORMAL HIGH (ref 8–23)
CO2: 27 mmol/L (ref 22–32)
Calcium: 8 mg/dL — ABNORMAL LOW (ref 8.9–10.3)
Chloride: 106 mmol/L (ref 98–111)
Creatinine, Ser: 1.26 mg/dL — ABNORMAL HIGH (ref 0.44–1.00)
GFR, Estimated: 47 mL/min — ABNORMAL LOW (ref >60)
Glucose, Bld: 109 mg/dL — ABNORMAL HIGH (ref 70–99)
Potassium: 3.5 mmol/L (ref 3.5–5.1)
Sodium: 140 mmol/L (ref 135–145)

## 2024-02-07 LAB — MAGNESIUM: Magnesium: 2 mg/dL (ref 1.7–2.4)

## 2024-02-07 LAB — LIPOPROTEIN A (LPA): Lipoprotein (a): 81.4 nmol/L — ABNORMAL HIGH (ref <75.0)

## 2024-02-07 LAB — COOXEMETRY PANEL
Carboxyhemoglobin: 3 % — ABNORMAL HIGH (ref 0.5–1.5)
Methemoglobin: <0.7 % (ref 0.0–1.5)
O2 Saturation: 55.3 %
Total hemoglobin: 7.5 g/dL — ABNORMAL LOW (ref 12.0–16.0)

## 2024-02-07 MED ORDER — MILRINONE LACTATE IN DEXTROSE 20-5 MG/100ML-% IV SOLN
0.3000 ug/kg/min | INTRAVENOUS | Status: AC
  Administered 2024-02-08: .25 ug/kg/min via INTRAVENOUS
  Filled 2024-02-07: qty 100

## 2024-02-07 MED ORDER — VANCOMYCIN HCL 1250 MG/250ML IV SOLN
1250.0000 mg | INTRAVENOUS | Status: AC
  Administered 2024-02-08: 1250 mg via INTRAVENOUS
  Filled 2024-02-07: qty 250

## 2024-02-07 MED ORDER — HYDRALAZINE HCL 10 MG PO TABS
20.0000 mg | ORAL_TABLET | Freq: Three times a day (TID) | ORAL | Status: AC
  Administered 2024-02-07: 20 mg via ORAL
  Filled 2024-02-07: qty 2

## 2024-02-07 MED ORDER — CEFAZOLIN SODIUM-DEXTROSE 1-4 GM/50ML-% IV SOLN
1.0000 g | Freq: Three times a day (TID) | INTRAVENOUS | Status: DC
  Administered 2024-02-07 – 2024-02-08 (×3): 1 g via INTRAVENOUS
  Filled 2024-02-07 (×5): qty 50

## 2024-02-07 MED ORDER — METOPROLOL TARTRATE 12.5 MG HALF TABLET
12.5000 mg | ORAL_TABLET | Freq: Once | ORAL | Status: AC
  Administered 2024-02-08: 12.5 mg via ORAL
  Filled 2024-02-07: qty 1

## 2024-02-07 MED ORDER — TRANEXAMIC ACID (OHS) BOLUS VIA INFUSION
15.0000 mg/kg | INTRAVENOUS | Status: AC
  Administered 2024-02-08: 948 mg via INTRAVENOUS
  Filled 2024-02-07: qty 948

## 2024-02-07 MED ORDER — CEFAZOLIN SODIUM-DEXTROSE 2-4 GM/100ML-% IV SOLN
2.0000 g | INTRAVENOUS | Status: AC
  Administered 2024-02-08: 2 g via INTRAVENOUS
  Filled 2024-02-07: qty 100

## 2024-02-07 MED ORDER — PHENYLEPHRINE HCL-NACL 20-0.9 MG/250ML-% IV SOLN
30.0000 ug/min | INTRAVENOUS | Status: DC
  Filled 2024-02-07: qty 250

## 2024-02-07 MED ORDER — NITROGLYCERIN IN D5W 200-5 MCG/ML-% IV SOLN
2.0000 ug/min | INTRAVENOUS | Status: DC
  Filled 2024-02-07: qty 250

## 2024-02-07 MED ORDER — CHLORHEXIDINE GLUCONATE CLOTH 2 % EX PADS
6.0000 | MEDICATED_PAD | Freq: Once | CUTANEOUS | Status: AC
  Administered 2024-02-08: 6 via TOPICAL

## 2024-02-07 MED ORDER — CHLORHEXIDINE GLUCONATE CLOTH 2 % EX PADS
6.0000 | MEDICATED_PAD | Freq: Once | CUTANEOUS | Status: AC
  Administered 2024-02-07: 6 via TOPICAL

## 2024-02-07 MED ORDER — DEXMEDETOMIDINE HCL IN NACL 400 MCG/100ML IV SOLN
0.1000 ug/kg/h | INTRAVENOUS | Status: AC
  Administered 2024-02-08: .3 ug/kg/h via INTRAVENOUS
  Filled 2024-02-07: qty 100

## 2024-02-07 MED ORDER — NOREPINEPHRINE 4 MG/250ML-% IV SOLN
0.0000 ug/min | INTRAVENOUS | Status: AC
  Administered 2024-02-08: 8 ug/min via INTRAVENOUS
  Filled 2024-02-07: qty 250

## 2024-02-07 MED ORDER — TRANEXAMIC ACID (OHS) PUMP PRIME SOLUTION
2.0000 mg/kg | INTRAVENOUS | Status: DC
  Filled 2024-02-07: qty 1.26

## 2024-02-07 MED ORDER — POTASSIUM CHLORIDE CRYS ER 20 MEQ PO TBCR
40.0000 meq | EXTENDED_RELEASE_TABLET | Freq: Once | ORAL | Status: AC
  Administered 2024-02-07: 40 meq via ORAL
  Filled 2024-02-07: qty 2

## 2024-02-07 MED ORDER — EPINEPHRINE HCL 5 MG/250ML IV SOLN IN NS
0.0000 ug/min | INTRAVENOUS | Status: DC
  Administered 2024-02-09: 15 ug/min via INTRAVENOUS
  Filled 2024-02-07: qty 250

## 2024-02-07 MED ORDER — POTASSIUM CHLORIDE 2 MEQ/ML IV SOLN
80.0000 meq | INTRAVENOUS | Status: DC
  Filled 2024-02-07: qty 40

## 2024-02-07 MED ORDER — CHLORHEXIDINE GLUCONATE 0.12 % MT SOLN
15.0000 mL | Freq: Once | OROMUCOSAL | Status: AC
  Administered 2024-02-08: 15 mL via OROMUCOSAL
  Filled 2024-02-07: qty 15

## 2024-02-07 MED ORDER — PLASMA-LYTE A IV SOLN
INTRAVENOUS | Status: DC
  Filled 2024-02-07: qty 2.5

## 2024-02-07 MED ORDER — MANNITOL 20 % IV SOLN
INTRAVENOUS | Status: DC
  Filled 2024-02-07: qty 13

## 2024-02-07 MED ORDER — HEPARIN 30,000 UNITS/1000 ML (OHS) CELLSAVER SOLUTION
Status: DC
  Filled 2024-02-07: qty 1000

## 2024-02-07 MED ORDER — TRANEXAMIC ACID 1000 MG/10ML IV SOLN
1.5000 mg/kg/h | INTRAVENOUS | Status: AC
  Administered 2024-02-08: 1.5 mg/kg/h via INTRAVENOUS
  Filled 2024-02-07: qty 25

## 2024-02-07 MED ORDER — INSULIN REGULAR(HUMAN) IN NACL 100-0.9 UT/100ML-% IV SOLN
INTRAVENOUS | Status: AC
  Administered 2024-02-08: 2 [IU]/h via INTRAVENOUS
  Filled 2024-02-07: qty 100

## 2024-02-07 NOTE — TOC Progression Note (Signed)
 Transition of Care [facility]) - Progression Note    Patient Details  Name: Tracy Baker MRN: [id number] Date of Birth: [birth year]  [facility] CM/SW Contact  Tracy Baker Phone Number: [phone] 02/07/2024, 11:22 AM  Clinical Narrative:   11:21 AM- HF CSW called the patients daughter, Tracy Baker and left a VM asking to be called back. CSW will continue to make attempts to reach her. Tracy Baker is the daughter to keep in the loop on all dispo discussions.   HF CSWCM will continue to follow and monitor for dc readiness.     Expected Discharge Plan: Skilled Nursing Facility Barriers to Discharge: Continued Medical Work up               Expected Discharge Plan and Services   Discharge Planning Services: CM Consult Post Acute Care Choice: Skilled Nursing Facility Living arrangements for the past 2 months: Apartment                                       Social Drivers of Health (SDOH) Interventions SDOH Screenings   Food Insecurity: No Food Insecurity (12/29/2023)  Housing: Low Risk  (12/29/2023)  Transportation Needs: No Transportation Needs (12/29/2023)  Utilities: Not At Risk (12/29/2023)  Alcohol Screen: Low Risk  (06/08/2022)  Depression (PHQ2-9): Low Risk  (11/08/2023)  Social Connections: Socially Isolated (12/29/2023)  Tobacco Use: High Risk (01/10/2024)    Readmission Risk Interventions     No data to display

## 2024-02-07 NOTE — Progress Notes (Signed)
 Physical Therapy Treatment Patient Details Name: Tracy Baker MRN: [id number] DOB: 1955/08/30 Today's Date: 02/07/2024   History of Present Illness 68 y.o. female adm 12/29/2023 after leaving AMA from [facility] (where she was on vacation due to lack of formal transfer) where she was being treated for C. Albicans candidemia. 10/28 TEE with mitral valve vegetation. 11/3 teeth Extractions.11/6 Mitral valve repair. Plan for redo sternotomy 12/5.  PMHx: thyroid  disease.    PT Comments  Pt received in supine, reluctantly agreeable to therapy session, emphasis on importance of increased activity within tolerance and sternal precs. Pt participated in a couple standing LE exercises, quick to fatigue after recent family visit, and with poor calf strength with attempt at heel raises. Pt needing reminders for sternal precs, recommend therapist bring handout next session after upcoming redo MVR surgery to reinforce precs. Pt needing up to CGA to perform transfers this date with increased fatigue and relies on RW in standing. VSS on RA. Pt continues to benefit from PT services to progress toward functional mobility goals, disposition pending PT reassessment post-op.    If plan is discharge home, recommend the following: Assistance with cooking/housework;Assist for transportation;Help with stairs or ramp for entrance;A little help with walking and/or transfers;A little help with bathing/dressing/bathroom   Can travel by private Psychologist, Clinical (4 wheels);BSC/3in1    Recommendations for Other Services Rehab consult     Precautions / Restrictions Precautions Precautions: Fall;Sternal Precaution Booklet Issued: No Recall of Precautions/Restrictions: Impaired Precaution/Restrictions Comments: Reviewed precautions, recommend therapist bring handout for re-eval after next surgery. Restrictions Weight Bearing Restrictions Per Provider Order: No Other Position/Activity  Restrictions: Cardiac sternal precautions     Mobility  Bed Mobility Overal bed mobility: Needs Assistance Bed Mobility: Rolling, Sidelying to Sit, Sit to Sidelying Rolling: Supervision Sidelying to sit: Contact guard assist     Sit to sidelying: Supervision General bed mobility comments: CGA to discourage pt from extending and abducting RUE while pushing away from mattress to sit up.    Transfers Overall transfer level: Needs assistance Equipment used: Rolling walker (2 wheels) Transfers: Sit to/from Stand Sit to Stand: Contact guard assist           General transfer comment: from EOB, cues for safe UE placement, CGA due to decreased eccentric control to sit with increased c/o fatigue.    Ambulation/Gait               General Gait Details: standing hip flexion at bedside wtih RW x10 reps, pt refuses ambulation due to c/o fatigue after visiting with family and dog recently   Stairs             Wheelchair Mobility     Tilt Bed    Modified Rankin (Stroke Patients Only)       Balance Overall balance assessment: Mild deficits observed, not formally tested Sitting-balance support: No upper extremity supported, Feet supported Sitting balance-Leahy Scale: Good     Standing balance support: Bilateral upper extremity supported, During functional activity, Reliant on assistive device for balance Standing balance-Leahy Scale: Fair Standing balance comment: Rw in standing                            Communication Communication Communication: No apparent difficulties  Cognition Arousal: Alert Behavior During Therapy: Flat affect   PT - Cognitive impairments: Safety/Judgement  PT - Cognition Comments: Pt aware of sternal precs but needs frequent reminders to not wing elbows out during functional tasks. Pt reluctantly cooperative. Following commands: Intact Following commands impaired: Only follows one step  commands consistently    Cueing Cueing Techniques: Verbal cues  Exercises General Exercises - Lower Extremity Hip Flexion/Marching: AROM, Both, 10 reps, Standing Heel Raises: AROM, Both, 10 reps, Standing (pt compensating with bil knee flexion, minimal heel raise only) Other Exercises Other Exercises: PTA discussed and reviewed IS use and frequency, pt defers to perform due to fatigue Other Exercises: STS from EOB for BLE strengthening, pt unable to perform reciprocal reps due to fatigue    General Comments General comments (skin integrity, edema, etc.): SpO2 93% and above on room air sitting/standing      Pertinent Vitals/Pain Pain Assessment Pain Assessment: No/denies pain Pain Intervention(s): Monitored during session    Home Living                          Prior Function            PT Goals (current goals can now be found in the care plan section) Acute Rehab PT Goals Patient Stated Goal: None stated PT Goal Formulation: With patient Time For Goal Achievement: 02/11/24 Progress towards PT goals: Progressing toward goals (limited today)    Frequency    Min 2X/week      PT Plan      Co-evaluation              AM-PAC PT 6 Clicks Mobility   Outcome Measure  Help needed turning from your back to your side while in a flat bed without using bedrails?: A Little Help needed moving from lying on your back to sitting on the side of a flat bed without using bedrails?: A Little Help needed moving to and from a bed to a chair (including a wheelchair)?: A Little Help needed standing up from a chair using your arms (e.g., wheelchair or bedside chair)?: A Little Help needed to walk in hospital room?: A Little (anticipated based on prior sessions) Help needed climbing 3-5 steps with a railing? : Total (too fatigued this date) 6 Click Score: 16    End of Session   Activity Tolerance: Patient limited by fatigue Patient left: in bed;with call bell/phone  within reach;with bed alarm set (PTA educated patient on why bed alarm set when questioned, pt agreeable) Nurse Communication: Mobility status PT Visit Diagnosis: Other abnormalities of gait and mobility (R26.89);Muscle weakness (generalized) (M62.81);Difficulty in walking, not elsewhere classified (R26.2)     Time: 8374-8364 PT Time Calculation (min) (ACUTE ONLY): 10 min  Charges:    $Therapeutic Exercise: 8-22 mins PT General Charges $$ ACUTE PT VISIT: 1 Visit                     Tracy Denny P., PTA Acute Rehabilitation Services Secure Chat Preferred 9a-5:30pm Office: [phone]    Tracy Baker [facility] 02/07/2024, 6:01 PM

## 2024-02-07 NOTE — Plan of Care (Signed)
  Problem: Activity: Goal: Risk for activity intolerance will decrease Outcome: Progressing   Problem: Nutrition: Goal: Adequate nutrition will be maintained Outcome: Progressing   Problem: Safety: Goal: Ability to remain free from injury will improve Outcome: Progressing   Problem: Skin Integrity: Goal: Risk for impaired skin integrity will decrease Outcome: Progressing   Problem: Cardiovascular: Goal: Ability to achieve and maintain adequate cardiovascular perfusion will improve Outcome: Progressing

## 2024-02-07 NOTE — H&P (View-Only) (Signed)
 [address] [employer] [address]             [phone]      2 Days Post-Op Procedure(s) (LRB): RIGHT HEART CATH (N/A) Subjective: Patient with no new complaints, reports she walked yesterday and ate 3 meals.   Objective: Vital signs in last 24 hours: Temp:  [97.7 F (36.5 C)-97.9 F (36.6 C)] 97.7 F (36.5 C) (12/04 0429) Pulse Rate:  [61-75] 61 (12/04 0429) Cardiac Rhythm: Normal sinus rhythm (12/04 0305) Resp:  [12-18] 12 (12/04 0429) BP: (106-123)/(51-77) 118/63 (12/04 0429) SpO2:  [92 %-96 %] 95 % (12/04 0429) Weight:  [63.2 kg] 63.2 kg (12/04 0429)  Hemodynamic parameters for last 24 hours: CVP:  [5 mmHg-8 mmHg] 8 mmHg  Intake/Output from previous day: 12/03 0701 - 12/04 0700 In: 1299.6 [P.O.:360; I.V.:107.3; IV Piggyback:832.4] Out: 300 [Urine:300] Intake/Output this shift: No intake/output data recorded.  General appearance: alert, cooperative, and no distress Neurologic: intact Heart: regular rate and rhythm, S1, S2 normal, no murmur, click, rub or gallop Lungs: diminished bibasilar breath sounds Abdomen: soft, non-tender; bowel sounds normal; no masses,  no organomegaly Extremities: extremities normal, atraumatic, no cyanosis or edema Wound: Clean and dry without sign of infection  Lab Results: Recent Labs    02/05/24 0420 02/05/24 0830 02/05/24 0831  WBC 8.8  --   --   HGB 8.4* 8.5* 8.5*  HCT 26.8* 25.0* 25.0*  PLT 202  --   --    BMET:  Recent Labs    02/06/24 0430 02/07/24 0426  NA 139 140  K 3.4* 3.5  CL 103 106  CO2 28 27  GLUCOSE 108* 109*  BUN 41* 30*  CREATININE 1.67* 1.26*  CALCIUM  8.5* 8.0*    PT/INR: No results for input(s): LABPROT, INR in the last 72 hours. ABG    Component Value Date/Time   PHART 7.387 01/11/2024 0856   HCO3 27.9 02/05/2024 0831   TCO2 29 02/05/2024 0831   ACIDBASEDEF 5.0 (H) 01/11/2024 0856   O2SAT 55.3 02/07/2024 0426   CBG (last 3)  No results for input(s):  GLUCAP in the last 72 hours.  Assessment/Plan: S/P Procedure(s) (LRB): RIGHT HEART CATH (N/A)  ENT: Recurrent epistaxis, ENT consulted and packed nose with absorbable surgicel packing. AHF team started Ancef  for TSS prophylaxis. Dry mouth, magic mouthwash, biotene dry mouth rinse and lozenges started with some improvement.   CV: Postop afib, NSR HR 70s this AM. 1st degree AVB. Holding Eliquis  for now with epistaxis since she is in NSR. On Amiodarone  200mg  BID. Echo with severe MR, plan for redo sternotomy, MVR tomorrow. BP stable.   Pulm: 2L O2 [place] was placed overnight, when removed patient saturating 95% on RA. Last CXR with small bilateral pleural effusions and improved bibasilar atelectasis, some interstitial markings. Encourage IS and ambulation.    GI: Severe malnutrition, poor PO intake improved yesterday. Continue Zofran  PRN. Encourage increased PO intake. +BM.    Renal: Cr 1.26, AKI much improved. UO 300cc/24hrs recorded. Under preop weight, +4lbs from yesterday? Unsure if accurate. Diuresis per AHF team. K 3.5, supplement.    ID: Candidemia/fungal endocarditis, continue Fluconazole . No leukocytosis, afebrile.    DVT Prophylaxis: SCDs, ambulation   Deconditioning: Insurance denied CIR, but will be getting surgery tomorrow. Continue to work with PT/OT to be as conditioned as possible prior to surgery.    Dispo: Plan for Redo sternotomy MVR tomorrow.    LOS: 36  days    [name] Bend, PA-C 02/07/2024  Patient seen and examined, agree with above For redo sternotomy for mitral replacement tomorrow All questions answered  Elspeth BROCKS. Kerrin, MD [facility] [phone]

## 2024-02-07 NOTE — Progress Notes (Signed)
 Advanced Heart Failure Rounding Note  Cardiologist: Lonni Cash, MD  Chief Complaint: S/p MV Repair / MV endocarditis  Patient Profile   Tracy Baker is a 68 y.o. female with hx of recent nephrolithiasis s/p ureteral stent and lithotripsy at [facility] 10/10 with stent removal. 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 Post-op course c/b post cardiotomy shock, junctional bradycardia, afib w/ RVR and HAP.   Subjective:    RHC 12/2 (on 0.125 of Milrinone ): RA 4, PA 42/13 (22), PCW 12 with Vwaves to 20, CO/CI TD 3.37/2.03, PAPi 7.5  Co-ox 55% on milrinone  0.125, CVP <5 sCr improving 2.46>1.67>1.26 Plan for MVR tomorrow  Feeling much better today. Has no shortness of breath or chest pain. Anxiety is better today.   Objective:    Weight Range: 63.2 kg Body mass index is 23.93 kg/m.   Vital Signs:   Temp:  [97.7 F (36.5 C)-97.9 F (36.6 C)] 97.7 F (36.5 C) (12/04 0429) Pulse Rate:  [61-75] 61 (12/04 0429) Resp:  [12-18] 12 (12/04 0429) BP: (106-123)/(51-77) 118/63 (12/04 0429) SpO2:  [92 %-96 %] 95 % (12/04 0429) Weight:  [63.2 kg] 63.2 kg (12/04 0429) Last BM Date : 01/27/24  Weight change: Filed Weights   02/04/24 0640 02/05/24 0432 02/07/24 0429  Weight: 61.9 kg 61.4 kg 63.2 kg   Intake/Output:  Intake/Output Summary (Last 24 hours) at 02/07/2024 0724 Last data filed at 02/07/2024 0304 Gross per 24 hour  Intake 1299.63 ml  Output 300 ml  Net 999.63 ml    Physical Exam   General: Well appearing. No distress  Cardiac: S1 and S2 present. CVP <5  Extremities: Warm and dry.  No edema.  Neuro: A&O x3. Affect pleasant.   Telemetry   SR 60s (personally reviewed)  Labs    CBC Recent Labs    02/05/24 0420 02/05/24 0830 02/05/24 0831  WBC 8.8  --   --   HGB 8.4* 8.5* 8.5*  HCT 26.8* 25.0* 25.0*  MCV 104.3*  --   --   PLT 202  --   --    Basic Metabolic Panel Recent Labs    87/96/74 0430 02/07/24 0426  NA  139 140  K 3.4* 3.5  CL 103 106  CO2 28 27  GLUCOSE 108* 109*  BUN 41* 30*  CREATININE 1.67* 1.26*  CALCIUM  8.5* 8.0*  MG 2.1 2.0   Liver Function Tests No results for input(s): AST, ALT, ALKPHOS, BILITOT, PROT, ALBUMIN  in the last 72 hours.  Medications:    Scheduled Medications:  amiodarone   200 mg Oral BID   Chlorhexidine  Gluconate Cloth  6 each Topical Daily   docusate sodium   200 mg Oral Daily   feeding supplement  237 mL Oral BID BM   hydrALAZINE   20 mg Oral TID   mirtazapine   15 mg Oral QHS   mupirocin ointment   Nasal BID   pantoprazole   40 mg Oral Daily   sodium chloride   2 spray Each Nare QID   sodium chloride  flush  10-40 mL Intracatheter Q12H   sodium chloride  flush  3 mL Intravenous Q12H   thiamine   100 mg Oral Daily    Infusions:   ceFAZolin  (ANCEF ) IV Stopped (02/06/24 2118)   fluconazole  (DIFLUCAN ) IV Stopped (02/06/24 1305)   milrinone  0.125 mcg/kg/min (02/07/24 0304)    PRN Medications: bisacodyl  **OR** [DISCONTINUED] bisacodyl , clonazePAM , magic mouthwash, menthol , ondansetron  (ZOFRAN ) IV, mouth rinse, sodium chloride , sodium chloride  flush  Assessment/Plan  1. 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 Kerrin for Candida endocarditis. Pre-op L/RHC 01/04/24 with no CAD and normal L/R heart pressures.  - Intra-op TEE LVEF 60-65%, RV mildly reduced, small immobile mass on anterior valve leaflet, mod MR, trivial TR - Failed initial Milrinone  wean. Eventually weaned off, unfortunately restarted 12/1 for low output.  - TEE 11/18  with severe mitral regurgitation of repaired valve - RHC 12/2 (on 0.125 of milrinone ): normal R/L filling pressures (RA 4, PCW 12), significant MR, and moderately reduced output by TD 2.03. - Co-ox 55%. On milrinone  0.125 - CVP <5. Hold diuretics  - off digoxin  w/ elevated level/AKI  - off spiro with  fluctant Cr - continue hydralazine  20 mg tid - TCTS planning for redo MVR tomorrow; family updated at length by HF and TCTS  2. Transient Junctional Brady  - resolved  3. Atrial fibrillation, post-op - remains in NSR on tele - continue amio 200 mg bid  - holding apixaban  for intermittent epistaxis per TCTS  4. AKI on CKD 3b - Scr baseline 1.4-1.5 - holding torsemide  and spiro - Cr bumped 2.46, improved with milrinone . Now 1.26  5. Protein-calorie malnutrition - encourage po intake. Appetite starting to improve. Keep off scopolamine  patch which caused extreme dry mouth. - continue Ensure  6. Epistaxis  - s/p bilateral nasal packing per ENT 12/1   - on empiric TSS ppx w ancef  (d/w PharmD)   - sx holding eliquis   Length of stay: 36  Marveline Profeta, NP  02/07/24, 7:24 AM  Advanced Heart Failure Team Pager [phone] (M-F; 7a - 5p)  Please contact [facility] for night-coverage after hours (4p -7a ) and weekends on [email or web address]

## 2024-02-07 NOTE — Progress Notes (Signed)
 PHARMACY NOTE:  ANTIMICROBIAL RENAL DOSAGE ADJUSTMENT  Current antimicrobial regimen includes a mismatch between antimicrobial dosage and estimated renal function.  As per policy approved by the Pharmacy & Therapeutics and Medical Executive Committees, the antimicrobial dosage will be adjusted accordingly.  Current antimicrobial dosage:  Ancef  1g IV q12h  Indication: nasal packing prophylaxis  Renal Function:  Estimated Creatinine Clearance: 36.9 mL/min (A) (by C-G formula based on SCr of 1.26 mg/dL (H)). []      On intermittent HD, scheduled: []      On CRRT    Antimicrobial dosage has been changed to:  1g IV q8h  Additional comments:   Thank you for allowing pharmacy to be a part of this patient's care.  Rocky Slade, PharmD, BCPS 02/07/2024 4:27 PM

## 2024-02-07 NOTE — Progress Notes (Addendum)
 [address] [employer] [address]             [phone]      2 Days Post-Op Procedure(s) (LRB): RIGHT HEART CATH (N/A) Subjective: Patient with no new complaints, reports she walked yesterday and ate 3 meals.   Objective: Vital signs in last 24 hours: Temp:  [97.7 F (36.5 C)-97.9 F (36.6 C)] 97.7 F (36.5 C) (12/04 0429) Pulse Rate:  [61-75] 61 (12/04 0429) Cardiac Rhythm: Normal sinus rhythm (12/04 0305) Resp:  [12-18] 12 (12/04 0429) BP: (106-123)/(51-77) 118/63 (12/04 0429) SpO2:  [92 %-96 %] 95 % (12/04 0429) Weight:  [63.2 kg] 63.2 kg (12/04 0429)  Hemodynamic parameters for last 24 hours: CVP:  [5 mmHg-8 mmHg] 8 mmHg  Intake/Output from previous day: 12/03 0701 - 12/04 0700 In: 1299.6 [P.O.:360; I.V.:107.3; IV Piggyback:832.4] Out: 300 [Urine:300] Intake/Output this shift: No intake/output data recorded.  General appearance: alert, cooperative, and no distress Neurologic: intact Heart: regular rate and rhythm, S1, S2 normal, no murmur, click, rub or gallop Lungs: diminished bibasilar breath sounds Abdomen: soft, non-tender; bowel sounds normal; no masses,  no organomegaly Extremities: extremities normal, atraumatic, no cyanosis or edema Wound: Clean and dry without sign of infection  Lab Results: Recent Labs    02/05/24 0420 02/05/24 0830 02/05/24 0831  WBC 8.8  --   --   HGB 8.4* 8.5* 8.5*  HCT 26.8* 25.0* 25.0*  PLT 202  --   --    BMET:  Recent Labs    02/06/24 0430 02/07/24 0426  NA 139 140  K 3.4* 3.5  CL 103 106  CO2 28 27  GLUCOSE 108* 109*  BUN 41* 30*  CREATININE 1.67* 1.26*  CALCIUM  8.5* 8.0*    PT/INR: No results for input(s): LABPROT, INR in the last 72 hours. ABG    Component Value Date/Time   PHART 7.387 01/11/2024 0856   HCO3 27.9 02/05/2024 0831   TCO2 29 02/05/2024 0831   ACIDBASEDEF 5.0 (H) 01/11/2024 0856   O2SAT 55.3 02/07/2024 0426   CBG (last 3)  No results for input(s):  GLUCAP in the last 72 hours.  Assessment/Plan: S/P Procedure(s) (LRB): RIGHT HEART CATH (N/A)  ENT: Recurrent epistaxis, ENT consulted and packed nose with absorbable surgicel packing. AHF team started Ancef  for TSS prophylaxis. Dry mouth, magic mouthwash, biotene dry mouth rinse and lozenges started with some improvement.   CV: Postop afib, NSR HR 70s this AM. 1st degree AVB. Holding Eliquis  for now with epistaxis since she is in NSR. On Amiodarone  200mg  BID. Echo with severe MR, plan for redo sternotomy, MVR tomorrow. BP stable.   Pulm: 2L O2 [place] was placed overnight, when removed patient saturating 95% on RA. Last CXR with small bilateral pleural effusions and improved bibasilar atelectasis, some interstitial markings. Encourage IS and ambulation.    GI: Severe malnutrition, poor PO intake improved yesterday. Continue Zofran  PRN. Encourage increased PO intake. +BM.    Renal: Cr 1.26, AKI much improved. UO 300cc/24hrs recorded. Under preop weight, +4lbs from yesterday? Unsure if accurate. Diuresis per AHF team. K 3.5, supplement.    ID: Candidemia/fungal endocarditis, continue Fluconazole . No leukocytosis, afebrile.    DVT Prophylaxis: SCDs, ambulation   Deconditioning: Insurance denied CIR, but will be getting surgery tomorrow. Continue to work with PT/OT to be as conditioned as possible prior to surgery.    Dispo: Plan for Redo sternotomy MVR tomorrow.    LOS: 36  days    [name] Bend, PA-C 02/07/2024  Patient seen and examined, agree with above For redo sternotomy for mitral replacement tomorrow All questions answered  Elspeth BROCKS. Kerrin, MD [facility] [phone]

## 2024-02-08 ENCOUNTER — Encounter (HOSPITAL_COMMUNITY): Payer: Self-pay | Admitting: Thoracic Surgery (Cardiothoracic Vascular Surgery)

## 2024-02-08 ENCOUNTER — Inpatient Hospital Stay (HOSPITAL_COMMUNITY)

## 2024-02-08 ENCOUNTER — Inpatient Hospital Stay (HOSPITAL_COMMUNITY)
Admission: EM | Disposition: A | Discharge status: Rehabilitation Facility | Payer: Self-pay | Source: Home / Self Care | Attending: Thoracic Surgery (Cardiothoracic Vascular Surgery)

## 2024-02-08 ENCOUNTER — Inpatient Hospital Stay (HOSPITAL_COMMUNITY): Admitting: Certified Registered Nurse Anesthetist

## 2024-02-08 DIAGNOSIS — I340 Nonrheumatic mitral (valve) insufficiency: Secondary | ICD-10-CM | POA: Diagnosis not present

## 2024-02-08 DIAGNOSIS — Z952 Presence of prosthetic heart valve: Secondary | ICD-10-CM

## 2024-02-08 HISTORY — PX: INTRAOPERATIVE TRANSESOPHAGEAL ECHOCARDIOGRAM: SHX5062

## 2024-02-08 HISTORY — PX: MITRAL VALVE REPLACEMENT: SHX147

## 2024-02-08 HISTORY — PX: REDO STERNOTOMY: SHX7393

## 2024-02-08 LAB — CBC
HCT: 28.8 % — ABNORMAL LOW (ref 36.0–46.0)
HCT: 29.6 % — ABNORMAL LOW (ref 36.0–46.0)
HCT: 30.1 % — ABNORMAL LOW (ref 36.0–46.0)
Hemoglobin: 8.8 g/dL — ABNORMAL LOW (ref 12.0–15.0)
Hemoglobin: 9.6 g/dL — ABNORMAL LOW (ref 12.0–15.0)
Hemoglobin: 9.8 g/dL — ABNORMAL LOW (ref 12.0–15.0)
MCH: 33 pg (ref 26.0–34.0)
MCH: 31.3 pg (ref 26.0–34.0)
MCH: 31.4 pg (ref 26.0–34.0)
MCHC: 30.6 g/dL (ref 30.0–36.0)
MCHC: 32.4 g/dL (ref 30.0–36.0)
MCHC: 32.6 g/dL (ref 30.0–36.0)
MCV: 107.9 fL — ABNORMAL HIGH (ref 80.0–100.0)
MCV: 96.4 fL (ref 80.0–100.0)
MCV: 96.5 fL (ref 80.0–100.0)
Platelets: 176 K/uL (ref 150–400)
Platelets: 107 K/uL — ABNORMAL LOW (ref 150–400)
Platelets: 137 K/uL — ABNORMAL LOW (ref 150–400)
RBC: 2.67 MIL/uL — ABNORMAL LOW (ref 3.87–5.11)
RBC: 3.07 MIL/uL — ABNORMAL LOW (ref 3.87–5.11)
RBC: 3.12 MIL/uL — ABNORMAL LOW (ref 3.87–5.11)
RDW: 23.2 % — ABNORMAL HIGH (ref 11.5–15.5)
RDW: 24.2 % — ABNORMAL HIGH (ref 11.5–15.5)
WBC: 7.5 K/uL (ref 4.0–10.5)
WBC: 10.3 K/uL (ref 4.0–10.5)
WBC: 12.8 K/uL — ABNORMAL HIGH (ref 4.0–10.5)
nRBC: 0 % (ref 0.0–0.2)
nRBC: 0.5 % — ABNORMAL HIGH (ref 0.0–0.2)
nRBC: 0.2 % (ref 0.0–0.2)

## 2024-02-08 LAB — POCT I-STAT, CHEM 8
BUN: 24 mg/dL — ABNORMAL HIGH (ref 8–23)
BUN: 27 mg/dL — ABNORMAL HIGH (ref 8–23)
BUN: 21 mg/dL (ref 8–23)
BUN: 25 mg/dL — ABNORMAL HIGH (ref 8–23)
BUN: 25 mg/dL — ABNORMAL HIGH (ref 8–23)
Calcium, Ion: 1.16 mmol/L (ref 1.15–1.40)
Calcium, Ion: 1.1 mmol/L — ABNORMAL LOW (ref 1.15–1.40)
Calcium, Ion: 0.81 mmol/L — CL (ref 1.15–1.40)
Calcium, Ion: 0.91 mmol/L — ABNORMAL LOW (ref 1.15–1.40)
Calcium, Ion: 1.05 mmol/L — ABNORMAL LOW (ref 1.15–1.40)
Chloride: 105 mmol/L (ref 98–111)
Chloride: 105 mmol/L (ref 98–111)
Chloride: 99 mmol/L (ref 98–111)
Chloride: 99 mmol/L (ref 98–111)
Chloride: 103 mmol/L (ref 98–111)
Creatinine, Ser: 1.1 mg/dL — ABNORMAL HIGH (ref 0.44–1.00)
Creatinine, Ser: 1 mg/dL (ref 0.44–1.00)
Creatinine, Ser: 0.8 mg/dL (ref 0.44–1.00)
Creatinine, Ser: 0.9 mg/dL (ref 0.44–1.00)
Creatinine, Ser: 0.9 mg/dL (ref 0.44–1.00)
Glucose, Bld: 205 mg/dL — ABNORMAL HIGH (ref 70–99)
Glucose, Bld: 192 mg/dL — ABNORMAL HIGH (ref 70–99)
Glucose, Bld: 198 mg/dL — ABNORMAL HIGH (ref 70–99)
Glucose, Bld: 183 mg/dL — ABNORMAL HIGH (ref 70–99)
Glucose, Bld: 182 mg/dL — ABNORMAL HIGH (ref 70–99)
HCT: 25 % — ABNORMAL LOW (ref 36.0–46.0)
HCT: 22 % — ABNORMAL LOW (ref 36.0–46.0)
HCT: 22 % — ABNORMAL LOW (ref 36.0–46.0)
HCT: 25 % — ABNORMAL LOW (ref 36.0–46.0)
HCT: 26 % — ABNORMAL LOW (ref 36.0–46.0)
Hemoglobin: 8.5 g/dL — ABNORMAL LOW (ref 12.0–15.0)
Hemoglobin: 7.5 g/dL — ABNORMAL LOW (ref 12.0–15.0)
Hemoglobin: 7.5 g/dL — ABNORMAL LOW (ref 12.0–15.0)
Hemoglobin: 8.5 g/dL — ABNORMAL LOW (ref 12.0–15.0)
Hemoglobin: 8.8 g/dL — ABNORMAL LOW (ref 12.0–15.0)
Potassium: 3.9 mmol/L (ref 3.5–5.1)
Potassium: 4 mmol/L (ref 3.5–5.1)
Potassium: 5.2 mmol/L — ABNORMAL HIGH (ref 3.5–5.1)
Potassium: 4.5 mmol/L (ref 3.5–5.1)
Potassium: 4.5 mmol/L (ref 3.5–5.1)
Sodium: 141 mmol/L (ref 135–145)
Sodium: 139 mmol/L (ref 135–145)
Sodium: 141 mmol/L (ref 135–145)
Sodium: 141 mmol/L (ref 135–145)
Sodium: 143 mmol/L (ref 135–145)
TCO2: 22 mmol/L (ref 22–32)
TCO2: 22 mmol/L (ref 22–32)
TCO2: 26 mmol/L (ref 22–32)
TCO2: 30 mmol/L (ref 22–32)
TCO2: 24 mmol/L (ref 22–32)

## 2024-02-08 LAB — POCT I-STAT EG7
Acid-base deficit: 4 mmol/L — ABNORMAL HIGH (ref 0.0–2.0)
Acid-base deficit: 3 mmol/L — ABNORMAL HIGH (ref 0.0–2.0)
Acid-base deficit: 2 mmol/L (ref 0.0–2.0)
Acid-base deficit: 4 mmol/L — ABNORMAL HIGH (ref 0.0–2.0)
Bicarbonate: 22 mmol/L (ref 20.0–28.0)
Bicarbonate: 21.3 mmol/L (ref 20.0–28.0)
Bicarbonate: 24.1 mmol/L (ref 20.0–28.0)
Bicarbonate: 21.8 mmol/L (ref 20.0–28.0)
Calcium, Ion: 1.16 mmol/L (ref 1.15–1.40)
Calcium, Ion: 0.81 mmol/L — CL (ref 1.15–1.40)
Calcium, Ion: 0.98 mmol/L — ABNORMAL LOW (ref 1.15–1.40)
Calcium, Ion: 1.22 mmol/L (ref 1.15–1.40)
HCT: 27 % — ABNORMAL LOW (ref 36.0–46.0)
HCT: 20 % — ABNORMAL LOW (ref 36.0–46.0)
HCT: 24 % — ABNORMAL LOW (ref 36.0–46.0)
HCT: 24 % — ABNORMAL LOW (ref 36.0–46.0)
Hemoglobin: 9.2 g/dL — ABNORMAL LOW (ref 12.0–15.0)
Hemoglobin: 6.8 g/dL — CL (ref 12.0–15.0)
Hemoglobin: 8.2 g/dL — ABNORMAL LOW (ref 12.0–15.0)
Hemoglobin: 8.2 g/dL — ABNORMAL LOW (ref 12.0–15.0)
O2 Saturation: 36 %
O2 Saturation: 80 %
O2 Saturation: 48 %
O2 Saturation: 51 %
Patient temperature: 36.3
Potassium: 4.1 mmol/L (ref 3.5–5.1)
Potassium: 4.7 mmol/L (ref 3.5–5.1)
Potassium: 3.9 mmol/L (ref 3.5–5.1)
Potassium: 3.5 mmol/L (ref 3.5–5.1)
Sodium: 141 mmol/L (ref 135–145)
Sodium: 141 mmol/L (ref 135–145)
Sodium: 144 mmol/L (ref 135–145)
Sodium: 145 mmol/L (ref 135–145)
TCO2: 23 mmol/L (ref 22–32)
TCO2: 22 mmol/L (ref 22–32)
TCO2: 25 mmol/L (ref 22–32)
TCO2: 23 mmol/L (ref 22–32)
pCO2, Ven: 42.2 mmHg — ABNORMAL LOW (ref 44–60)
pCO2, Ven: 32.8 mmHg — ABNORMAL LOW (ref 44–60)
pCO2, Ven: 44.9 mmHg (ref 44–60)
pCO2, Ven: 43 mmHg — ABNORMAL LOW (ref 44–60)
pH, Ven: 7.325 (ref 7.25–7.43)
pH, Ven: 7.421 (ref 7.25–7.43)
pH, Ven: 7.338 (ref 7.25–7.43)
pH, Ven: 7.309 (ref 7.25–7.43)
pO2, Ven: 23 mmHg — CL (ref 32–45)
pO2, Ven: 43 mmHg (ref 32–45)
pO2, Ven: 28 mmHg — CL (ref 32–45)
pO2, Ven: 29 mmHg — CL (ref 32–45)

## 2024-02-08 LAB — POCT I-STAT 7, (LYTES, BLD GAS, ICA,H+H)
Acid-Base Excess: 0 mmol/L (ref 0.0–2.0)
Acid-base deficit: 3 mmol/L — ABNORMAL HIGH (ref 0.0–2.0)
Acid-base deficit: 1 mmol/L (ref 0.0–2.0)
Acid-base deficit: 4 mmol/L — ABNORMAL HIGH (ref 0.0–2.0)
Acid-base deficit: 7 mmol/L — ABNORMAL HIGH (ref 0.0–2.0)
Acid-base deficit: 5 mmol/L — ABNORMAL HIGH (ref 0.0–2.0)
Bicarbonate: 20.3 mmol/L (ref 20.0–28.0)
Bicarbonate: 26.5 mmol/L (ref 20.0–28.0)
Bicarbonate: 23.8 mmol/L (ref 20.0–28.0)
Bicarbonate: 21.6 mmol/L (ref 20.0–28.0)
Bicarbonate: 20.3 mmol/L (ref 20.0–28.0)
Bicarbonate: 21 mmol/L (ref 20.0–28.0)
Calcium, Ion: 0.71 mmol/L — CL (ref 1.15–1.40)
Calcium, Ion: 0.89 mmol/L — CL (ref 1.15–1.40)
Calcium, Ion: 1.06 mmol/L — ABNORMAL LOW (ref 1.15–1.40)
Calcium, Ion: 1.5 mmol/L — ABNORMAL HIGH (ref 1.15–1.40)
Calcium, Ion: 1.16 mmol/L (ref 1.15–1.40)
Calcium, Ion: 1.26 mmol/L (ref 1.15–1.40)
HCT: 20 % — ABNORMAL LOW (ref 36.0–46.0)
HCT: 24 % — ABNORMAL LOW (ref 36.0–46.0)
HCT: 24 % — ABNORMAL LOW (ref 36.0–46.0)
HCT: 29 % — ABNORMAL LOW (ref 36.0–46.0)
HCT: 26 % — ABNORMAL LOW (ref 36.0–46.0)
HCT: 28 % — ABNORMAL LOW (ref 36.0–46.0)
Hemoglobin: 6.8 g/dL — CL (ref 12.0–15.0)
Hemoglobin: 8.2 g/dL — ABNORMAL LOW (ref 12.0–15.0)
Hemoglobin: 8.2 g/dL — ABNORMAL LOW (ref 12.0–15.0)
Hemoglobin: 9.9 g/dL — ABNORMAL LOW (ref 12.0–15.0)
Hemoglobin: 8.8 g/dL — ABNORMAL LOW (ref 12.0–15.0)
Hemoglobin: 9.5 g/dL — ABNORMAL LOW (ref 12.0–15.0)
O2 Saturation: 100 %
O2 Saturation: 100 %
O2 Saturation: 100 %
O2 Saturation: 95 %
O2 Saturation: 99 %
O2 Saturation: 99 %
Patient temperature: 36.3
Patient temperature: 35.7
Patient temperature: 37.3
Potassium: 5 mmol/L (ref 3.5–5.1)
Potassium: 4.6 mmol/L (ref 3.5–5.1)
Potassium: 4.5 mmol/L (ref 3.5–5.1)
Potassium: 3.7 mmol/L (ref 3.5–5.1)
Potassium: 4.1 mmol/L (ref 3.5–5.1)
Potassium: 4.1 mmol/L (ref 3.5–5.1)
Sodium: 140 mmol/L (ref 135–145)
Sodium: 139 mmol/L (ref 135–145)
Sodium: 142 mmol/L (ref 135–145)
Sodium: 142 mmol/L (ref 135–145)
Sodium: 142 mmol/L (ref 135–145)
Sodium: 143 mmol/L (ref 135–145)
TCO2: 21 mmol/L — ABNORMAL LOW (ref 22–32)
TCO2: 28 mmol/L (ref 22–32)
TCO2: 25 mmol/L (ref 22–32)
TCO2: 23 mmol/L (ref 22–32)
TCO2: 22 mmol/L (ref 22–32)
TCO2: 22 mmol/L (ref 22–32)
pCO2 arterial: 29.4 mmHg — ABNORMAL LOW (ref 32–48)
pCO2 arterial: 52.8 mmHg — ABNORMAL HIGH (ref 32–48)
pCO2 arterial: 36.5 mmHg (ref 32–48)
pCO2 arterial: 37.7 mmHg (ref 32–48)
pCO2 arterial: 44.9 mmHg (ref 32–48)
pCO2 arterial: 42.3 mmHg (ref 32–48)
pH, Arterial: 7.447 (ref 7.35–7.45)
pH, Arterial: 7.309 — ABNORMAL LOW (ref 7.35–7.45)
pH, Arterial: 7.422 (ref 7.35–7.45)
pH, Arterial: 7.362 (ref 7.35–7.45)
pH, Arterial: 7.257 — ABNORMAL LOW (ref 7.35–7.45)
pH, Arterial: 7.304 — ABNORMAL LOW (ref 7.35–7.45)
pO2, Arterial: 365 mmHg — ABNORMAL HIGH (ref 83–108)
pO2, Arterial: 362 mmHg — ABNORMAL HIGH (ref 83–108)
pO2, Arterial: 199 mmHg — ABNORMAL HIGH (ref 83–108)
pO2, Arterial: 75 mmHg — ABNORMAL LOW (ref 83–108)
pO2, Arterial: 141 mmHg — ABNORMAL HIGH (ref 83–108)
pO2, Arterial: 139 mmHg — ABNORMAL HIGH (ref 83–108)

## 2024-02-08 LAB — PREPARE CRYOPRECIPITATE: Unit division: 0

## 2024-02-08 LAB — BASIC METABOLIC PANEL WITH GFR
Anion gap: 13 (ref 5–15)
Anion gap: 18 — ABNORMAL HIGH (ref 5–15)
BUN: 24 mg/dL — ABNORMAL HIGH (ref 8–23)
BUN: 19 mg/dL (ref 8–23)
CO2: 25 mmol/L (ref 22–32)
CO2: 19 mmol/L — ABNORMAL LOW (ref 22–32)
Calcium: 8.5 mg/dL — ABNORMAL LOW (ref 8.9–10.3)
Calcium: 9.3 mg/dL (ref 8.9–10.3)
Chloride: 103 mmol/L (ref 98–111)
Chloride: 105 mmol/L (ref 98–111)
Creatinine, Ser: 1.15 mg/dL — ABNORMAL HIGH (ref 0.44–1.00)
Creatinine, Ser: 1.41 mg/dL — ABNORMAL HIGH (ref 0.44–1.00)
GFR, Estimated: 52 mL/min — ABNORMAL LOW (ref >60)
GFR, Estimated: 41 mL/min — ABNORMAL LOW (ref >60)
Glucose, Bld: 171 mg/dL — ABNORMAL HIGH (ref 70–99)
Glucose, Bld: 212 mg/dL — ABNORMAL HIGH (ref 70–99)
Potassium: 4 mmol/L (ref 3.5–5.1)
Potassium: 4.1 mmol/L (ref 3.5–5.1)
Sodium: 141 mmol/L (ref 135–145)
Sodium: 142 mmol/L (ref 135–145)

## 2024-02-08 LAB — BPAM CRYOPRECIPITATE
Blood Product Expiration Date: 202512102359
ISSUE DATE / TIME: 202512051118
Unit Type and Rh: 5100

## 2024-02-08 LAB — COOXEMETRY PANEL
Carboxyhemoglobin: 2.2 % — ABNORMAL HIGH (ref 0.5–1.5)
Carboxyhemoglobin: 3.7 % — ABNORMAL HIGH (ref 0.5–1.5)
Methemoglobin: 2.2 % — ABNORMAL HIGH (ref 0.0–1.5)
Methemoglobin: 0.7 % (ref 0.0–1.5)
O2 Saturation: 54.1 %
O2 Saturation: 55.4 %
Total hemoglobin: 9.4 g/dL — ABNORMAL LOW (ref 12.0–16.0)
Total hemoglobin: 9.7 g/dL — ABNORMAL LOW (ref 12.0–16.0)

## 2024-02-08 LAB — APTT: aPTT: 39 s — ABNORMAL HIGH (ref 24–36)

## 2024-02-08 LAB — HEMOGLOBIN AND HEMATOCRIT, BLOOD
HCT: 25.6 % — ABNORMAL LOW (ref 36.0–46.0)
Hemoglobin: 8.3 g/dL — ABNORMAL LOW (ref 12.0–15.0)

## 2024-02-08 LAB — GLUCOSE, CAPILLARY
Glucose-Capillary: 182 mg/dL — ABNORMAL HIGH (ref 70–99)
Glucose-Capillary: 198 mg/dL — ABNORMAL HIGH (ref 70–99)
Glucose-Capillary: 205 mg/dL — ABNORMAL HIGH (ref 70–99)
Glucose-Capillary: 184 mg/dL — ABNORMAL HIGH (ref 70–99)
Glucose-Capillary: 207 mg/dL — ABNORMAL HIGH (ref 70–99)
Glucose-Capillary: 191 mg/dL — ABNORMAL HIGH (ref 70–99)
Glucose-Capillary: 220 mg/dL — ABNORMAL HIGH (ref 70–99)
Glucose-Capillary: 197 mg/dL — ABNORMAL HIGH (ref 70–99)
Glucose-Capillary: 191 mg/dL — ABNORMAL HIGH (ref 70–99)

## 2024-02-08 LAB — MAGNESIUM
Magnesium: 2.1 mg/dL (ref 1.7–2.4)
Magnesium: 3.3 mg/dL — ABNORMAL HIGH (ref 1.7–2.4)

## 2024-02-08 LAB — PLATELET COUNT: Platelets: 55 K/uL — ABNORMAL LOW (ref 150–400)

## 2024-02-08 LAB — PREPARE RBC (CROSSMATCH)

## 2024-02-08 LAB — PROTIME-INR
INR: 2.5 — ABNORMAL HIGH (ref 0.8–1.2)
Prothrombin Time: 28.5 s — ABNORMAL HIGH (ref 11.4–15.2)

## 2024-02-08 LAB — FIBRINOGEN: Fibrinogen: 257 mg/dL (ref 210–475)

## 2024-02-08 SURGERY — REDO STERNOTOMY
Anesthesia: General

## 2024-02-08 MED ORDER — EPHEDRINE 5 MG/ML INJ
INTRAVENOUS | Status: AC
  Filled 2024-02-08: qty 5

## 2024-02-08 MED ORDER — VANCOMYCIN HCL IN DEXTROSE 1-5 GM/200ML-% IV SOLN
1000.0000 mg | Freq: Once | INTRAVENOUS | Status: AC
  Administered 2024-02-08: 1000 mg via INTRAVENOUS
  Filled 2024-02-08: qty 200

## 2024-02-08 MED ORDER — PROTAMINE SULFATE 10 MG/ML IV SOLN
INTRAVENOUS | Status: AC
  Filled 2024-02-08: qty 5

## 2024-02-08 MED ORDER — ASPIRIN 81 MG PO CHEW: 324.0000 mg | CHEWABLE_TABLET | Freq: Every day | ORAL | Status: DC

## 2024-02-08 MED ORDER — EPHEDRINE SULFATE-NACL 50-0.9 MG/10ML-% IV SOSY
PREFILLED_SYRINGE | INTRAVENOUS | Status: DC | PRN
  Administered 2024-02-08: 25 mg via INTRAVENOUS

## 2024-02-08 MED ORDER — SODIUM CHLORIDE 0.9 % IV SOLN: 250.0000 mL | INTRAVENOUS | Status: AC

## 2024-02-08 MED ORDER — FENTANYL CITRATE (PF) 250 MCG/5ML IJ SOLN
INTRAMUSCULAR | Status: AC
  Filled 2024-02-08: qty 5

## 2024-02-08 MED ORDER — FENTANYL CITRATE (PF) 250 MCG/5ML IJ SOLN
INTRAMUSCULAR | Status: DC | PRN
  Administered 2024-02-08: 100 ug via INTRAVENOUS
  Administered 2024-02-08: 50 ug via INTRAVENOUS

## 2024-02-08 MED ORDER — NOREPINEPHRINE 4 MG/250ML-% IV SOLN
0.0000 ug/min | INTRAVENOUS | Status: DC
  Administered 2024-02-08: 23 ug/min via INTRAVENOUS
  Administered 2024-02-09: 35 ug/min via INTRAVENOUS
  Administered 2024-02-09: 40 ug/min via INTRAVENOUS
  Filled 2024-02-08 (×2): qty 250

## 2024-02-08 MED ORDER — MIDAZOLAM HCL (PF) 10 MG/2ML IJ SOLN
INTRAMUSCULAR | Status: AC
  Filled 2024-02-08: qty 2

## 2024-02-08 MED ORDER — TRAMADOL HCL 50 MG PO TABS: 50.0000 mg | ORAL_TABLET | ORAL | Status: DC | PRN

## 2024-02-08 MED ORDER — EPINEPHRINE HCL 5 MG/250ML IV SOLN IN NS
INTRAVENOUS | Status: DC | PRN
  Administered 2024-02-08: 2 ug/min via INTRAVENOUS

## 2024-02-08 MED ORDER — ORAL CARE MOUTH RINSE: 15.0000 mL | OROMUCOSAL | Status: DC | PRN

## 2024-02-08 MED ORDER — DEXMEDETOMIDINE HCL IN NACL 400 MCG/100ML IV SOLN
0.0000 ug/kg/h | INTRAVENOUS | Status: DC
  Administered 2024-02-09 (×2): 0.7 ug/kg/h via INTRAVENOUS
  Filled 2024-02-08 (×3): qty 100

## 2024-02-08 MED ORDER — PANTOPRAZOLE SODIUM 40 MG PO TBEC: 40.0000 mg | DELAYED_RELEASE_TABLET | Freq: Every day | ORAL | Status: DC

## 2024-02-08 MED ORDER — DEXTROSE 50 % IV SOLN
0.0000 mL | INTRAVENOUS | Status: DC | PRN
  Filled 2024-02-08: qty 50

## 2024-02-08 MED ORDER — CALCIUM CHLORIDE 10 % IV SOLN
1.0000 g | Freq: Once | INTRAVENOUS | Status: AC
  Administered 2024-02-08: 1 g via INTRAVENOUS

## 2024-02-08 MED ORDER — LACTATED RINGERS IV SOLN: INTRAVENOUS | Status: DC | PRN

## 2024-02-08 MED ORDER — PROPOFOL 10 MG/ML IV BOLUS
INTRAVENOUS | Status: DC | PRN
  Administered 2024-02-08 (×2): 50 mg via INTRAVENOUS

## 2024-02-08 MED ORDER — METOPROLOL TARTRATE 25 MG/10 ML ORAL SUSPENSION: 12.5000 mg | Freq: Two times a day (BID) | ORAL | Status: DC

## 2024-02-08 MED ORDER — SODIUM BICARBONATE 8.4 % IV SOLN
100.0000 meq | Freq: Once | INTRAVENOUS | Status: AC
  Administered 2024-02-08: 100 meq via INTRAVENOUS

## 2024-02-08 MED ORDER — ROCURONIUM BROMIDE 10 MG/ML (PF) SYRINGE
PREFILLED_SYRINGE | INTRAVENOUS | Status: AC
  Filled 2024-02-08: qty 10

## 2024-02-08 MED ORDER — CHLORHEXIDINE GLUCONATE CLOTH 2 % EX PADS
6.0000 | MEDICATED_PAD | Freq: Every day | CUTANEOUS | Status: DC
  Administered 2024-02-10: 6 via TOPICAL

## 2024-02-08 MED ORDER — OXYCODONE HCL 5 MG PO TABS: 5.0000 mg | ORAL_TABLET | ORAL | Status: DC | PRN

## 2024-02-08 MED ORDER — METOPROLOL TARTRATE 5 MG/5ML IV SOLN: 2.5000 mg | INTRAVENOUS | Status: DC | PRN

## 2024-02-08 MED ORDER — SODIUM CHLORIDE 0.45 % IV SOLN: INTRAVENOUS | Status: AC | PRN

## 2024-02-08 MED ORDER — FENTANYL CITRATE (PF) 50 MCG/ML IJ SOSY
50.0000 ug | PREFILLED_SYRINGE | INTRAMUSCULAR | Status: DC | PRN
  Administered 2024-02-08 – 2024-02-09 (×3): 100 ug via INTRAVENOUS
  Filled 2024-02-08 (×3): qty 2

## 2024-02-08 MED ORDER — NITROGLYCERIN 0.2 MG/ML ON CALL CATH LAB
INTRAVENOUS | Status: DC | PRN
  Administered 2024-02-08: 40 ug via INTRAVENOUS

## 2024-02-08 MED ORDER — SODIUM CHLORIDE 0.9 % IV SOLN: INTRAVENOUS | Status: AC

## 2024-02-08 MED ORDER — CALCIUM CHLORIDE 10 % IV SOLN
INTRAVENOUS | Status: DC | PRN
  Administered 2024-02-08: 1 g via INTRAVENOUS

## 2024-02-08 MED ORDER — MIDAZOLAM HCL (PF) 2 MG/2ML IJ SOLN
2.0000 mg | INTRAMUSCULAR | Status: DC | PRN
  Administered 2024-02-09 (×3): 2 mg via INTRAVENOUS
  Filled 2024-02-08 (×3): qty 2

## 2024-02-08 MED ORDER — CLEVIDIPINE BUTYRATE 0.5 MG/ML IV EMUL
INTRAVENOUS | Status: AC
  Filled 2024-02-08: qty 50

## 2024-02-08 MED ORDER — VASOPRESSIN 20 UNIT/ML IV SOLN
INTRAVENOUS | Status: DC | PRN
  Administered 2024-02-08 (×9): 1 [IU] via INTRAVENOUS

## 2024-02-08 MED ORDER — PROTAMINE SULFATE 10 MG/ML IV SOLN
INTRAVENOUS | Status: AC
  Filled 2024-02-08: qty 25

## 2024-02-08 MED ORDER — BISACODYL 10 MG RE SUPP: 10.0000 mg | Freq: Every day | RECTAL | Status: DC

## 2024-02-08 MED ORDER — SCOPOLAMINE 1 MG/3DAYS TD PT72
MEDICATED_PATCH | TRANSDERMAL | Status: AC
  Filled 2024-02-08: qty 1

## 2024-02-08 MED ORDER — ARTIFICIAL TEARS OPHTHALMIC OINT
TOPICAL_OINTMENT | OPHTHALMIC | Status: DC | PRN
  Administered 2024-02-08: 1 via OPHTHALMIC

## 2024-02-08 MED ORDER — MAGNESIUM SULFATE 4 GM/100ML IV SOLN
4.0000 g | Freq: Once | INTRAVENOUS | Status: AC
  Administered 2024-02-08: 4 g via INTRAVENOUS
  Filled 2024-02-08: qty 100

## 2024-02-08 MED ORDER — SODIUM CHLORIDE 0.9% FLUSH: 3.0000 mL | INTRAVENOUS | Status: DC | PRN

## 2024-02-08 MED ORDER — MILRINONE LACTATE IN DEXTROSE 20-5 MG/100ML-% IV SOLN
0.3750 ug/kg/min | INTRAVENOUS | Status: DC
  Administered 2024-02-08: 0.375 ug/kg/min via INTRAVENOUS
  Administered 2024-02-10: 0.25 ug/kg/min via INTRAVENOUS
  Filled 2024-02-08 (×3): qty 100

## 2024-02-08 MED ORDER — MIDAZOLAM HCL (PF) 5 MG/ML IJ SOLN
INTRAMUSCULAR | Status: DC | PRN
  Administered 2024-02-08 (×2): 1 mg via INTRAVENOUS
  Administered 2024-02-08: 2 mg via INTRAVENOUS
  Administered 2024-02-08: 1 mg via INTRAVENOUS

## 2024-02-08 MED ORDER — ORAL CARE MOUTH RINSE
15.0000 mL | OROMUCOSAL | Status: DC
  Administered 2024-02-08 – 2024-02-18 (×113): 15 mL via OROMUCOSAL

## 2024-02-08 MED ORDER — LIDOCAINE 2% (20 MG/ML) 5 ML SYRINGE
INTRAMUSCULAR | Status: AC
  Filled 2024-02-08: qty 5

## 2024-02-08 MED ORDER — METOPROLOL TARTRATE 12.5 MG HALF TABLET: 12.5000 mg | ORAL_TABLET | Freq: Two times a day (BID) | ORAL | Status: DC

## 2024-02-08 MED ORDER — INSULIN REGULAR(HUMAN) IN NACL 100-0.9 UT/100ML-% IV SOLN
INTRAVENOUS | Status: DC
  Administered 2024-02-09: 5.5 [IU]/h via INTRAVENOUS
  Filled 2024-02-08 (×2): qty 100

## 2024-02-08 MED ORDER — SODIUM CHLORIDE 0.9 % IV SOLN: 1.0000 g | Freq: Once | INTRAVENOUS | Status: DC

## 2024-02-08 MED ORDER — ALBUMIN HUMAN 5 % IV SOLN: INTRAVENOUS | Status: DC | PRN

## 2024-02-08 MED ORDER — NOREPINEPHRINE BITARTRATE 1 MG/ML IV SOLN
INTRAVENOUS | Status: DC | PRN
  Administered 2024-02-08 (×6): .5 mL via INTRAVENOUS

## 2024-02-08 MED ORDER — NITROGLYCERIN IN D5W 200-5 MCG/ML-% IV SOLN: 0.0000 ug/min | INTRAVENOUS | Status: DC

## 2024-02-08 MED ORDER — SODIUM CHLORIDE (PF) 0.9 % IJ SOLN: OROMUCOSAL | Status: DC | PRN

## 2024-02-08 MED ORDER — SODIUM CHLORIDE 0.9% IV SOLUTION: Freq: Once | INTRAVENOUS | Status: AC

## 2024-02-08 MED ORDER — PLASMA-LYTE A IV SOLN: INTRAVENOUS | Status: DC | PRN

## 2024-02-08 MED ORDER — ACETAMINOPHEN 500 MG PO TABS: 1000.0000 mg | ORAL_TABLET | Freq: Four times a day (QID) | ORAL | Status: DC

## 2024-02-08 MED ORDER — POTASSIUM CHLORIDE 10 MEQ/50ML IV SOLN
10.0000 meq | INTRAVENOUS | Status: AC
  Administered 2024-02-08 (×3): 10 meq via INTRAVENOUS

## 2024-02-08 MED ORDER — ARTIFICIAL TEARS OPHTHALMIC OINT
TOPICAL_OINTMENT | OPHTHALMIC | Status: AC
  Filled 2024-02-08: qty 3.5

## 2024-02-08 MED ORDER — BISACODYL 5 MG PO TBEC: 10.0000 mg | DELAYED_RELEASE_TABLET | Freq: Every day | ORAL | Status: DC

## 2024-02-08 MED ORDER — VASOPRESSIN 20 UNIT/ML IV SOLN
INTRAVENOUS | Status: AC
  Filled 2024-02-08: qty 1

## 2024-02-08 MED ORDER — SODIUM CHLORIDE 0.9% FLUSH: 10.0000 mL | INTRAVENOUS | Status: DC | PRN

## 2024-02-08 MED ORDER — LACTATED RINGERS IV SOLN: INTRAVENOUS | Status: AC

## 2024-02-08 MED ORDER — CEFAZOLIN SODIUM-DEXTROSE 2-4 GM/100ML-% IV SOLN
2.0000 g | Freq: Three times a day (TID) | INTRAVENOUS | Status: DC
  Administered 2024-02-08 – 2024-02-09 (×4): 2 g via INTRAVENOUS
  Filled 2024-02-08 (×4): qty 100

## 2024-02-08 MED ORDER — METHADONE HCL IV SYRINGE 10 MG/ML FOR CABG
0.2000 mg/kg | Freq: Once | INTRAMUSCULAR | Status: AC
  Administered 2024-02-08: 11 mg via INTRAVENOUS
  Filled 2024-02-08: qty 1.1

## 2024-02-08 MED ORDER — EPINEPHRINE HCL 5 MG/250ML IV SOLN IN NS
0.0000 ug/min | INTRAVENOUS | Status: DC
  Administered 2024-02-08: 10 ug/min via INTRAVENOUS
  Filled 2024-02-08: qty 250

## 2024-02-08 MED ORDER — ALBUMIN HUMAN 5 % IV SOLN
250.0000 mL | INTRAVENOUS | Status: AC | PRN
  Administered 2024-02-08 (×5): 12.5 g via INTRAVENOUS
  Filled 2024-02-08 (×4): qty 250

## 2024-02-08 MED ORDER — VASOPRESSIN 20 UNITS/100 ML INFUSION FOR SHOCK
INTRAVENOUS | Status: DC | PRN
  Administered 2024-02-08: .03 [IU]/min via INTRAVENOUS

## 2024-02-08 MED ORDER — CALCIUM CHLORIDE 10 % IV SOLN
1.0000 g | Freq: Once | INTRAVENOUS | Status: DC
  Filled 2024-02-08: qty 10

## 2024-02-08 MED ORDER — ACETAMINOPHEN 160 MG/5ML PO SOLN
650.0000 mg | Freq: Once | ORAL | Status: AC
  Administered 2024-02-08: 650 mg
  Filled 2024-02-08: qty 20.3

## 2024-02-08 MED ORDER — SODIUM CHLORIDE 0.9% FLUSH
3.0000 mL | Freq: Two times a day (BID) | INTRAVENOUS | Status: DC
  Administered 2024-02-09 – 2024-02-10 (×3): 3 mL via INTRAVENOUS

## 2024-02-08 MED ORDER — PANTOPRAZOLE SODIUM 40 MG IV SOLR
40.0000 mg | Freq: Every day | INTRAVENOUS | Status: DC
  Administered 2024-02-08: 40 mg via INTRAVENOUS
  Filled 2024-02-08: qty 10

## 2024-02-08 MED ORDER — ONDANSETRON HCL 4 MG/2ML IJ SOLN
INTRAMUSCULAR | Status: AC
  Filled 2024-02-08: qty 2

## 2024-02-08 MED ORDER — SCOPOLAMINE 1 MG/3DAYS TD PT72
MEDICATED_PATCH | TRANSDERMAL | Status: DC | PRN
  Administered 2024-02-08: 1 via TRANSDERMAL

## 2024-02-08 MED ORDER — HEPARIN SODIUM (PORCINE) 1000 UNIT/ML IJ SOLN
INTRAMUSCULAR | Status: DC | PRN
  Administered 2024-02-08: 24000 [IU] via INTRAVENOUS

## 2024-02-08 MED ORDER — METOCLOPRAMIDE HCL 5 MG/ML IJ SOLN
10.0000 mg | Freq: Four times a day (QID) | INTRAMUSCULAR | Status: AC
  Administered 2024-02-08 – 2024-02-10 (×6): 10 mg via INTRAVENOUS
  Filled 2024-02-08 (×8): qty 2

## 2024-02-08 MED ORDER — ASPIRIN 81 MG PO CHEW
324.0000 mg | CHEWABLE_TABLET | Freq: Once | ORAL | Status: AC
  Administered 2024-02-08: 324 mg via ORAL
  Filled 2024-02-08: qty 4

## 2024-02-08 MED ORDER — PROPOFOL 10 MG/ML IV BOLUS
INTRAVENOUS | Status: AC
  Filled 2024-02-08: qty 20

## 2024-02-08 MED ORDER — NITROPRUSSIDE SODIUM-NACL 20-0.9 MG/100ML-% IV SOLN: INTRAVENOUS | Status: DC | PRN

## 2024-02-08 MED ORDER — CHLORHEXIDINE GLUCONATE 0.12 % MT SOLN
15.0000 mL | OROMUCOSAL | Status: AC
  Administered 2024-02-08: 15 mL via OROMUCOSAL
  Filled 2024-02-08: qty 15

## 2024-02-08 MED ORDER — AMIODARONE HCL 200 MG PO TABS: 200.0000 mg | ORAL_TABLET | Freq: Every day | ORAL | Status: DC

## 2024-02-08 MED ORDER — PROTAMINE SULFATE 10 MG/ML IV SOLN
INTRAVENOUS | Status: DC | PRN
  Administered 2024-02-08 (×2): 50 mg via INTRAVENOUS
  Administered 2024-02-08: 240 mg via INTRAVENOUS

## 2024-02-08 MED ORDER — ROCURONIUM BROMIDE 10 MG/ML (PF) SYRINGE
PREFILLED_SYRINGE | INTRAVENOUS | Status: DC | PRN
  Administered 2024-02-08: 30 mg via INTRAVENOUS
  Administered 2024-02-08 (×2): 50 mg via INTRAVENOUS
  Administered 2024-02-08: 70 mg via INTRAVENOUS

## 2024-02-08 MED ORDER — ACETAMINOPHEN 160 MG/5ML PO SOLN
1000.0000 mg | Freq: Four times a day (QID) | ORAL | Status: DC
  Administered 2024-02-08 – 2024-02-09 (×2): 1000 mg
  Filled 2024-02-08 (×2): qty 40.6

## 2024-02-08 MED ORDER — ONDANSETRON HCL 4 MG/2ML IJ SOLN: 4.0000 mg | Freq: Four times a day (QID) | INTRAMUSCULAR | Status: DC | PRN

## 2024-02-08 MED ORDER — SCOPOLAMINE 1 MG/3DAYS TD PT72
1.0000 | MEDICATED_PATCH | TRANSDERMAL | Status: DC
  Filled 2024-02-08: qty 1

## 2024-02-08 MED ORDER — PHENYLEPHRINE 80 MCG/ML (10ML) SYRINGE FOR IV PUSH (FOR BLOOD PRESSURE SUPPORT)
PREFILLED_SYRINGE | INTRAVENOUS | Status: DC | PRN
  Administered 2024-02-08 (×5): 160 ug via INTRAVENOUS

## 2024-02-08 MED ORDER — SODIUM CHLORIDE 0.9% FLUSH
10.0000 mL | Freq: Two times a day (BID) | INTRAVENOUS | Status: DC
  Administered 2024-02-08 – 2024-02-10 (×4): 10 mL

## 2024-02-08 MED ORDER — ENSURE PRE-SURGERY PO LIQD
355.0000 mL | ORAL | Status: AC
  Administered 2024-02-08: 355 mL via ORAL
  Filled 2024-02-08: qty 592

## 2024-02-08 MED ORDER — 0.9 % SODIUM CHLORIDE (POUR BTL) OPTIME
TOPICAL | Status: DC | PRN
  Administered 2024-02-08: 6000 mL

## 2024-02-08 MED ORDER — ASPIRIN 325 MG PO TBEC: 325.0000 mg | DELAYED_RELEASE_TABLET | Freq: Every day | ORAL | Status: DC

## 2024-02-08 MED ORDER — HEPARIN SODIUM (PORCINE) 1000 UNIT/ML IJ SOLN
INTRAMUSCULAR | Status: AC
  Filled 2024-02-08: qty 1

## 2024-02-08 MED ORDER — NOREPINEPHRINE 16 MG/250ML-% IV SOLN
0.0000 ug/min | INTRAVENOUS | Status: DC
  Administered 2024-02-08: 23 ug/min via INTRAVENOUS
  Administered 2024-02-09: 4 ug/min via INTRAVENOUS
  Administered 2024-02-09: 22 ug/min via INTRAVENOUS
  Administered 2024-02-13 – 2024-02-14 (×2): 1 ug/min via INTRAVENOUS
  Administered 2024-02-18: 10:00:00 6 ug/min via INTRAVENOUS
  Filled 2024-02-08 (×5): qty 250

## 2024-02-08 MED ORDER — ONDANSETRON HCL 4 MG/2ML IJ SOLN
INTRAMUSCULAR | Status: DC | PRN
  Administered 2024-02-08: 4 mg via INTRAVENOUS

## 2024-02-08 MED ORDER — PROMETHAZINE HCL 6.25 MG/5ML PO SOLN
12.5000 mg | ORAL | Status: DC | PRN
  Filled 2024-02-08: qty 10

## 2024-02-08 MED ORDER — SODIUM CHLORIDE 0.9 % IV SOLN
0.0000 ug/min | INTRAVENOUS | Status: DC
  Administered 2024-02-08: 10 ug/min via INTRAVENOUS
  Filled 2024-02-08 (×2): qty 10

## 2024-02-08 MED ORDER — DOCUSATE SODIUM 100 MG PO CAPS: 200.0000 mg | ORAL_CAPSULE | Freq: Every day | ORAL | Status: DC

## 2024-02-08 MED ORDER — CHLORHEXIDINE GLUCONATE CLOTH 2 % EX PADS
6.0000 | MEDICATED_PAD | Freq: Every day | CUTANEOUS | Status: DC
  Administered 2024-02-09 – 2024-02-11 (×2): 6 via TOPICAL

## 2024-02-08 MED ORDER — SODIUM CHLORIDE 0.9 % IV SOLN: INTRAVENOUS | Status: DC | PRN

## 2024-02-08 SURGICAL SUPPLY — 88 items
ADAPTER CARDIO PERF ANTE/RETRO (ADAPTER) ×1 IMPLANT
BAG DECANTER FOR FLEXI CONT (MISCELLANEOUS) ×1 IMPLANT
BLADE CLIPPER SURG (BLADE) ×1 IMPLANT
BLADE CORE FAN STRYKER (BLADE) ×1 IMPLANT
BLADE STERNUM SYSTEM 6 (BLADE) ×1 IMPLANT
BLADE SURG 15 STRL LF DISP TIS (BLADE) ×1 IMPLANT
BLOOD HAEMOCONCENTR 700 MIDI (MISCELLANEOUS) IMPLANT
CANISTER SUCTION 3000ML PPV (SUCTIONS) ×1 IMPLANT
CANNULA AORTIC ROOT 9FR (CANNULA) ×1 IMPLANT
CANNULA ARTERIAL NVNT 3/8 22FR (MISCELLANEOUS) IMPLANT
CANNULA EZ GLIDE AORTIC 21FR (CANNULA) IMPLANT
CANNULA FEM VENOUS REMOTE 22FR (CANNULA) IMPLANT
CANNULA GUNDRY RCSP 15FR (MISCELLANEOUS) IMPLANT
CANNULA PRFSN 3/8XCNCT RT ANG (MISCELLANEOUS) IMPLANT
CANNULA SUMP PERICARDIAL (CANNULA) ×1 IMPLANT
CATH ROBINSON RED A/P 18FR (CATHETERS) IMPLANT
CATH THORACIC 36FR (CATHETERS) IMPLANT
CATH THORACIC 36FR RT ANG (CATHETERS) IMPLANT
CIRCUIT VACPAC SAFETY (MISCELLANEOUS) IMPLANT
CONN 1/2X1/2X1/2 BEN (MISCELLANEOUS) ×1 IMPLANT
CONN ST 3/8 X 1/2 (MISCELLANEOUS) ×2 IMPLANT
CONN Y 3/8X3/8X3/8 BEN (MISCELLANEOUS) IMPLANT
CONTAINER PROTECT SURGISLUSH (MISCELLANEOUS) ×2 IMPLANT
COVER PROBE W GEL 5X96 (DRAPES) IMPLANT
DEVICE SUT CK QUICK LOAD MINI (Prosthesis & Implant Heart) IMPLANT
DRAIN CHANNEL 28F RND 3/8 FF (WOUND CARE) IMPLANT
DRAPE SRG 135X102X78XABS (DRAPES) ×1 IMPLANT
DRAPE WARM FLUID 44X44 (DRAPES) ×1 IMPLANT
DRSG AQUACEL AG ADV 3.5X14 (GAUZE/BANDAGES/DRESSINGS) IMPLANT
DRSG COVADERM 4X14 (GAUZE/BANDAGES/DRESSINGS) ×1 IMPLANT
DRSG TEGADERM 4X4.5 CHG (GAUZE/BANDAGES/DRESSINGS) IMPLANT
ELECT CAUTERY BLADE 6.4 (BLADE) IMPLANT
ELECTRODE REM PT RTRN 9FT ADLT (ELECTROSURGICAL) ×2 IMPLANT
FELT TEFLON 1X6 (MISCELLANEOUS) ×2 IMPLANT
GAUZE 4X4 16PLY ~~LOC~~+RFID DBL (SPONGE) ×1 IMPLANT
GAUZE SPONGE 4X4 12PLY STRL (GAUZE/BANDAGES/DRESSINGS) ×2 IMPLANT
GLOVE SS BIOGEL STRL SZ 7.5 (GLOVE) ×1 IMPLANT
GOWN STRL REUS W/ TWL LRG LVL3 (GOWN DISPOSABLE) ×4 IMPLANT
HEMOSTAT POWDER SURGIFOAM 1G (HEMOSTASIS) ×3 IMPLANT
HEMOSTAT SURGICEL 2X14 (HEMOSTASIS) IMPLANT
INSERT FOGARTY XLG (MISCELLANEOUS) IMPLANT
KIT BASIN OR (CUSTOM PROCEDURE TRAY) ×1 IMPLANT
KIT DILATOR VASC 18G NDL (KITS) IMPLANT
KIT SUCTION CATH 14FR (SUCTIONS) ×1 IMPLANT
KIT SUT CK MINI COMBO 4X17 (Prosthesis & Implant Heart) IMPLANT
KIT TURNOVER KIT B (KITS) ×1 IMPLANT
LINE VENT (MISCELLANEOUS) IMPLANT
LOOP VASCLR EXTRA MAXI WHITE (MISCELLANEOUS) ×1 IMPLANT
PACK OPEN HEART (CUSTOM PROCEDURE TRAY) ×1 IMPLANT
PAD ARMBOARD POSITIONER FOAM (MISCELLANEOUS) ×2 IMPLANT
POSITIONER HEAD DONUT 9IN (MISCELLANEOUS) ×1 IMPLANT
SET MPS 3-ND DEL (MISCELLANEOUS) IMPLANT
SOLN 0.9% NACL POUR BTL 1000ML (IV SOLUTION) ×4 IMPLANT
SOLN STERILE WATER BTL 1000 ML (IV SOLUTION) ×2 IMPLANT
SPONGE T-LAP 18X18 ~~LOC~~+RFID (SPONGE) ×4 IMPLANT
SPONGE T-LAP 4X18 ~~LOC~~+RFID (SPONGE) ×1 IMPLANT
STAPLER SKIN PROX 35W (STAPLE) IMPLANT
SUT EB EXC GRN/WHT 2-0 V-5 (SUTURE) IMPLANT
SUT ETHIBOND 2 0 SH (SUTURE) IMPLANT
SUT ETHIBOND 2 0 SH 36X2 (SUTURE) ×2 IMPLANT
SUT ETHIBOND 2 0 V4 (SUTURE) IMPLANT
SUT ETHIBOND 2 0V4 GREEN (SUTURE) IMPLANT
SUT PROLENE 3 0 SH1 36 (SUTURE) ×1 IMPLANT
SUT PROLENE 4-0 RB1 .5 CRCL 36 (SUTURE) ×2 IMPLANT
SUT PROLENE 5 0 C 1 36 (SUTURE) ×2 IMPLANT
SUT SILK 1 MH (SUTURE) ×2 IMPLANT
SUT SILK 1 TIES 10X30 (SUTURE) ×1 IMPLANT
SUT SILK 2 0 SH CR/8 (SUTURE) ×2 IMPLANT
SUT SILK 2-0 18XBRD TIE 12 (SUTURE) ×1 IMPLANT
SUT SILK 3 0 SH CR/8 (SUTURE) ×1 IMPLANT
SUT SILK 4-0 18XBRD TIE 12 (SUTURE) ×1 IMPLANT
SUT STEEL 6MS V (SUTURE) IMPLANT
SUT STEEL SZ 6 DBL 3X14 BALL (SUTURE) IMPLANT
SUT TEM PAC WIRE 2 0 SH (SUTURE) ×4 IMPLANT
SUT VIC AB 1 CTX36XBRD ANBCTR (SUTURE) IMPLANT
SUT VIC AB 2-0 CTX 27 (SUTURE) IMPLANT
SUT VIC AB 3-0 X1 27 (SUTURE) IMPLANT
SUTURE EB EXC GRN/WHT 2-0 D/A (SUTURE) ×1 IMPLANT
SYSTEM SAHARA CHEST DRAIN ATS (WOUND CARE) ×1 IMPLANT
TAPE CLOTH SURG 4X10 WHT LF (GAUZE/BANDAGES/DRESSINGS) IMPLANT
TAPE PAPER 2X10 WHT MICROPORE (GAUZE/BANDAGES/DRESSINGS) IMPLANT
TOWEL GREEN STERILE (TOWEL DISPOSABLE) ×1 IMPLANT
TOWEL GREEN STERILE FF (TOWEL DISPOSABLE) ×1 IMPLANT
TRAY FOLEY SLVR 16FR TEMP STAT (SET/KITS/TRAYS/PACK) ×1 IMPLANT
TUBE SUCT INTRACARD DLP 20F (MISCELLANEOUS) ×1 IMPLANT
UNDERPAD 30X36 HEAVY ABSORB (UNDERPADS AND DIAPERS) ×1 IMPLANT
VALVE MITRAL MITRIS RESILIA 27 (Valve) IMPLANT
WIRE EMERALD 3MM-J .035X260CM (WIRE) IMPLANT

## 2024-02-08 NOTE — Op Note (Unsigned)
 NAME: Tracy Baker, Tracy Baker MEDICAL RECORD NO: [id number] ACCOUNT NO: [id number] DATE OF BIRTH: 1956/01/24 FACILITY: [facility] LOCATION: [facility] PHYSICIAN: Elspeth BROCKS. Kerrin, MD  Operative Report   DATE OF PROCEDURE: 02/08/2024  PREOPERATIVE DIAGNOSIS:  Severe mitral regurgitation post repair.  POSTOPERATIVE DIAGNOSIS:  Severe mitral regurgitation post repair.  PROCEDURE:  Redo median sternotomy mitral valve replacement with 27 mm Mitris Resilia tissue valve (model number 11400M, serial number [id number]).  SURGEON:  Elspeth BROCKS. Kerrin, MD  ASSISTANT:  Con Bend, PA  Experienced assistance was necessary for this case due to surgical complexity.  Con Bend assisted with exposure, retraction of delicate tissues, suture management and suctioning.  ANESTHESIA:  General.  FINDINGS:  Transesophageal echocardiography showed severe mitral regurgitation, preserved left ventricular function, mild right ventricular dysfunction.  Intraoperative findings difficult access due to scarring.  Edge to edge suture had torn through  posterior leaflet.  No vegetations seen.  Post bypass transesophageal echocardiography showed good function of the prosthetic valve.  Tiny pinhole perivalvular good movement of the valve leaflets.  CLINICAL NOTE:  The patient is a 68 year old woman who had previously undergone mitral valve repair for Candida endocarditis.  She developed signs of worsening heart failure and was found to have severe mitral regurgitation.  Attempts was made to wean  the patient from pressors and give her an opportunity to rehabilitate prior to undergoing a second operation.  However she showed signs of decompensation and was started back on milrinone  and advised to undergo redo surgery for mitral valve replacement.   The indications, risks and benefits were discussed in detail with the patient she accepted the risks and agreed to proceed.  OPERATIVE NOTE:  The patient was brought to the  operating room on 02/08/2024.  She had induction of general anesthesia and was intubated.  Intravenous antibiotics were administered.  A Foley catheter was placed.  The chest, abdomen and legs were prepped  and draped in the usual sterile fashion.  Transesophageal echocardiography was performed by Dr. Dorethea of anesthesiology.  Please see his separately dictated note for full details of the procedure.  A time out was performed.  A redo median sternotomy  was performed through the prior incision.  Dissected through the skin subcutaneous tissue, the sternal wires were removed.  The oscillating saw was used to divide the sternum which was partially healed.  Each half of the sternum then was elevated and the  underlying tissue was dissected off.  Pleural spaces entered bilaterally.  There was approximately a 1.5 L pleural effusion on the right and a 1 L pleural effusion on the left.  A sternal retractor was placed and was gradually opened over time.   Dissection then was continued down, the pericardium was identified and was opened.  There were adhesions for the most part of the planes were easily developed using gentle blunt dissection.  In some areas particularly over the aortic pledget sites sharp  dissection was required.  After dissecting out the aorta, patient was fully heparinized.  After confirming adequate anticoagulation with ACT measurement, the aorta was cannulated via concentric 2-0 Ethibond pledgeted pursestring sutures.  The right  common femoral vein then was accessed percutaneously using the Seldinger technique.  A wire was advanced, position within the heart was confirmed, the tract over the wire was dilated and a 22 French venous cannula was advanced from the right groin into  the heart.  It was connected to the bypass circuit.  Cardiopulmonary bypass was initiated. Flows were maintained per protocol. The  patient was cooled to 32 degrees Celsius.  A 28-French metal tip right angle venous  cannula was placed via a pursestring  suture in the superior vena cava.  It was then connected to the bypass circuit.  Carbon dioxide was insufflated into the operative field.  A retrograde cardioplegia cannula was placed via a pursestring suture in the right atrium and directed into the  coronary sinus.  A foam pad was placed in the pericardium to insulate the heart. A temperature probe was placed in the myocardial septum and a cardioplegia cannula was placed in the ascending aorta.  The aorta was cross clamped.  Cardiac arrest was achieved with a combination of cold antegrade and retrograde KBC blood cardioplegia and topical ice saline.  An initial 750 mL of cardioplegia was administered antegrade.  There was a diastolic arrest and  an additional 500 mL was given retrograde.  Additional cardioplegia was administered at 60 minutes initially and then 30 minutes thereafter.  The inferior vena caval cannula was pulled back to below the diaphragm and a caval tape was tightened.  The left  atriotomy was opened, a retractor was placed, the valve was inspected.  Access was difficult due to relative rigidity of the tissues.  The edge to edge suture had torn through the posterior leaflet, was still attached to the anterior leaflet.  The  anterior leaflet was not repairable.  It was excised maintaining its chordal attachments and a small portion of the leaflet which was then tacked to the annulus with a 2-0 Ethibond pledgeted suture to maintain the anterior chords.  The posterior leaflet  was left in place.  2-0 Ethibond horizontal mattress sutures then were placed circumferentially around the annulus.  The posterior sutures were placed initially and traction on those allowed exposure that then allowed the anterior annular sutures to be  placed.  The annulus sized for a 27 mm Mitris Resilia prosthetic valve.  The sutures were passed through the sewing ring of the valve. The valve was lowered into place and seated  nicely.  The sutures were secured using the Cor-Knot device.  At  completion, the annulus was probed with a fine tip right angle and no gaps were noted.  Rewarming was begun.  The atriotomy was closed with two layers of running 4-0 Prolene suture.  The first was a running horizontal mattress suture followed by a  running simple suture.  De-airing was performed after the first layer before tying the sutures.  A warm dose of reanimation cardioplegia was administered that was 400 mL given via the retrograde cannula.  The aortic cross clamp was removed.  Total cross  clamp time was 122 minutes.  While rewarming was completed, hemostasis was achieved.  Epicardial pacing wires were placed on the right ventricle and right atrium and DDD pacing was initiated.  The retrograde cardioplegia cannula was removed.  The aortic root vent was left in place.   The caval tapes were removed.  When the patient had rewarmed to a core temperature of 37 degrees Celsius she was weaned from cardiopulmonary bypass.  The lungs were reinflated.  The heart was then gradually allowed to fill with blood.  She was on  milrinone  epinephrine , norepinephrine  and vasopressin  infusions at the time of separation from bypass and also DDD paced at 80 beats per minute.  Post bypass, transesophageal echocardiography showed preserved left ventricular function.  There was some  mild right ventricular dysfunction.  There was good function of the prosthetic valve with no  significant perivalvular leak.  A test dose of protamine  was administered.  The superior vena caval cannula and aortic cannulae were removed.  The remainder of  the protamine  was administered without incident.  The groin venous cannula was then removed and pressure was held at that site for 30 minutes and with good hemostasis.  The chest was copiously irrigated with warm saline. Hemostasis was achieved.  28  French Blake drains were placed into each pleural space and a 36 French chest  tube was placed in the anterior mediastinum.  The pericardium could not be reapproximated but the mediastinal fatty tissue was closed with 3-0 silk sutures to cover the aorta.   The sternum was closed with a combination of single and double heavy gauge stainless steel wires.  The pectoralis fascia, subcutaneous tissue and skin were closed in standard fashion.  All sponge needle and instrument counts were correct at the end of  the procedure.  The patient did have thrombocytopenia and coagulopathy.  Those were treated with FFP and platelets.  Anemia was treated with packed red blood cells.  The patient was transported from the operating room to the surgical intensive care unit,  intubated and in fair condition.      SUJ D: 02/08/2024 3:58:00 pm T: 02/08/2024 9:37:00 pm  JOB: [id number]/ [id number]

## 2024-02-08 NOTE — Anesthesia Procedure Notes (Addendum)
 Arterial Line Insertion Start/End12/07/2023 7:00 AM, 02/08/2024 7:05 AM Performed by: Zelphia Norleen HERO, CRNA, CRNA  Patient location: Pre-op. Preanesthetic checklist: patient identified, IV checked, site marked, risks and benefits discussed, surgical consent, monitors and equipment checked, pre-op evaluation, timeout performed and anesthesia consent Lidocaine  1% used for infiltration Right, radial was placed Catheter size: 20 G Hand hygiene performed  and maximum sterile barriers used  Allen's test indicative of satisfactory collateral circulation Attempts: 1 Following insertion, dressing applied and Biopatch. Post procedure assessment: normal and unchanged  Patient tolerated the procedure well with no immediate complications.

## 2024-02-08 NOTE — Consult Note (Signed)
 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 Kerrin. PCCM consulted for ICU post-op vent management.  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 Kerrin  Interim History / Subjective:  PCCM consulted for ICU vent management  Objective    Blood pressure 117/66, pulse (!) 57, temperature (!) 97.5 F (36.4 C), temperature source Oral, resp. rate 15, height 5' 4 (1.626 m), weight 64.3 kg, SpO2 96%. PAP: (62-84)/(16-21) 62/20 CVP:  [8 mmHg-11 mmHg] 8 mmHg      Intake/Output Summary (Last 24 hours) at 02/08/2024 1148 Last data filed at 02/08/2024 1130 Gross per 24 hour  Intake 1871.09 ml  Output 850 ml  Net 1021.09 ml   Filed Weights   02/05/24 0432 02/07/24 0429 02/08/24 0524  Weight: 61.4 kg 63.2 kg 64.3 kg   Examination: Post-op assessment General: critically-ill woman post-op, in NAD, intubated and sedated HEENT: AT/[place], PERRL, mmm, Swan cco Pulm: ETT, ventilator-assisted breaths, coarse bilaterally CV: RRR, sternal incision w/ post-op dressing moderate strikethrough, c/d/I, pacing wires present, mediastinal CT w/ minimal sanguineous output Extremities/Integumentary: grey-ashen c/f hypoperfusion, cool GI: soft, non distended, non tender to palpation Neuro:  GCS3TS E1 V1 M1, intubated sedated   Resolved Problem List:   Assessment and Plan:   MV Candida endocarditis s/p MV repair, 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 SBP goal <150. Albumin  boluses prn for low SV.  - Pre-op PAP 50-60s->post-op 30s - Vaso stopped post-op - Continue inotrope support with milrinone  - avoid acidemia, coagulopathy, hypocalcemia, hypothermia - insulin  gtt for BG 110-140; no hx DM - tele monitoring/ pacing prn - mediastinal drains per TCTS - multimodal pain control per protocol- oxycodone , tramadol , with bowel regimen - ASA, statin to resume post-op - metoprolol  qd - complete post-op antibiotics - Candida endocarditis: continue Fluconazole  - monitor electrolytes, replete PRN  Post bypass vasoplegia - wean vasopressors for MAP 65-90, albumin  prn   Post-op Afib -Hold Eliquis  d/t bleeding risk  Post-operative vent management - rapid wean per TCTS protocol - VAP/ PPI - CXR/ ABG now  CKD 3b -Monitor renal indices -Maintain adequate uop -Diuresis per AHF  Expected post-operative ABLA Expected post-operative consumptive thrombocytopenia Recurrent epistaxis  - trend; transfuse for hgb <8 or significant bleeding  - correct coagulopathy with significant bleeding   - ENT following; packed nose with absorbable surgicel; Ancef  x 48 hours; surg ppx   VTE ppx: hold post-op GI ppx: Pantoprazole   Labs:  CBC: Recent Labs  Lab 02/02/24 0500 02/03/24 0335 02/04/24 0355 02/05/24 0420 02/05/24 0830 02/08/24 0601 02/08/24 0752 02/08/24 0948 02/08/24 0953 02/08/24 1016 02/08/24 1046 02/08/24 1118  WBC  7.9 8.2 12.5* 8.8  --  7.5  --   --   --   --   --   --   HGB 9.2* 9.2* 9.4* 8.4*   < > 8.8*   < > 6.8* 6.8* 7.5* 8.2* 8.5*  HCT 29.4* 28.9* 29.4* 26.8*   < >  28.8*   < > 20.0* 20.0* 22.0* 24.0* 25.0*  MCV 101.4* 102.1* 103.2* 104.3*  --  107.9*  --   --   --   --   --   --   PLT 179 183 235 202  --  176  --   --   --   --   --   --    < > = values in this interval not displayed.    Basic Metabolic Panel: Recent Labs  Lab 02/04/24 0355 02/04/24 1257 02/05/24 0420 02/05/24 0830 02/06/24 0430 02/07/24 0426 02/08/24 0601 02/08/24 0752 02/08/24 0806 02/08/24 0917 02/08/24 0948 02/08/24 0953 02/08/24 1016 02/08/24 1046 02/08/24 1118  NA 136 139 138   < > 139 140 141   < > 141 139 140 141 141 139 141  K 4.6 5.0 3.6   < > 3.4* 3.5 4.0   < > 3.9 4.0 5.0 4.7 5.2* 4.6 4.5  CL 100 100 98  --  103 106 103  --  105 105  --   --  99  --  99  CO2 23 25 28   --  28 27 25   --   --   --   --   --   --   --   --   GLUCOSE 168* 107* 105*  --  108* 109* 171*  --  205* 192*  --   --  198*  --  183*  BUN 43* 47* 52*  --  41* 30* 24*  --  24* 27*  --   --  21  --  25*  CREATININE 2.12* 2.46* 2.44*  --  1.67* 1.26* 1.15*  --  1.10* 1.00  --   --  0.80  --  0.90  CALCIUM  9.1 9.0 8.6*  --  8.5* 8.0* 8.5*  --   --   --   --   --   --   --   --   MG 2.2  --  2.1  --  2.1 2.0 2.1  --   --   --   --   --   --   --   --    < > = values in this interval not displayed.   GFR: Estimated Creatinine Clearance: 51.7 mL/min (by C-G formula based on SCr of 0.9 mg/dL). Recent Labs  Lab 02/03/24 0335 02/04/24 0355 02/05/24 0420 02/08/24 0601  WBC 8.2 12.5* 8.8 7.5    Liver Function Tests: No results for input(s): AST, ALT, ALKPHOS, BILITOT, PROT, ALBUMIN  in the last 168 hours. No results for input(s): LIPASE, AMYLASE in the last 168 hours. No results for input(s): AMMONIA in the last 168 hours.  ABG    Component Value Date/Time   PHART 7.309 (L) 02/08/2024 1046   PCO2ART 52.8 (H) 02/08/2024 1046   PO2ART 362 (H) 02/08/2024 1046   HCO3 26.5 02/08/2024 1046   TCO2 30 02/08/2024 1118   ACIDBASEDEF 3.0 (H) 02/08/2024 0953   O2SAT 100  02/08/2024 1046    Coagulation Profile: No results for input(s): INR, PROTIME in the last 168 hours.  Cardiac Enzymes: No results for  input(s): CKTOTAL, CKMB, CKMBINDEX, TROPONINI in the last 168 hours.  HbA1C: Hgb A1c MFr Bld  Date/Time Value Ref Range Status  01/11/2024 12:00 PM 5.4 4.8 - 5.6 % Final    Comment:    (NOTE) Diagnosis of Diabetes The following HbA1c ranges recommended by the American Diabetes Association (ADA) may be used as an aid in the diagnosis of diabetes mellitus.  Hemoglobin             Suggested A1C NGSP%              Diagnosis  <5.7                   Non Diabetic  5.7-6.4                Pre-Diabetic  >6.4                   Diabetic  <7.0                   Glycemic control for                       adults with diabetes.    06/13/2023 08:10 AM 5.8 (H) 4.8 - 5.6 % Final    Comment:             Prediabetes: 5.7 - 6.4          Diabetes: >6.4          Glycemic control for adults with diabetes: <7.0    CBG: No results for input(s): GLUCAP in the last 168 hours.  Review of Systems:   Unable to obtain, patient is intubated/sedated  Past Medical History:  She,  has a past medical history of Thyroid  disease.   Surgical History:   Past Surgical History:  Procedure Laterality Date   APPENDECTOMY     AUGMENTATION MAMMAPLASTY Bilateral 1981   removed in 2006   BREAST IMPLANT REMOVAL Bilateral 2007   CARDIOVERSION N/A 01/22/2024   Procedure: CARDIOVERSION;  Surgeon: Rolan Ezra RAMAN, MD;  Location: [facility] INVASIVE CV LAB;  Service: Cardiovascular;  Laterality: N/A;   CHOLECYSTECTOMY  2010   COLONOSCOPY WITH PROPOFOL  N/A 06/30/2021   Procedure: COLONOSCOPY WITH PROPOFOL ;  Surgeon: Jinny Carmine, MD;  Location: [facility] ENDOSCOPY;  Service: Endoscopy;  Laterality: N/A;   CONTROL OF EPISTAXIS  02/04/2024   INTRAOPERATIVE TRANSESOPHAGEAL ECHOCARDIOGRAM N/A 01/10/2024   Procedure: ECHOCARDIOGRAM, TRANSESOPHAGEAL, INTRAOPERATIVE;  Surgeon:  Kerrin Elspeth BROCKS, MD;  Location: [facility] OR;  Service: Open Heart Surgery;  Laterality: N/A;   LAPAROSCOPIC TOTAL HYSTERECTOMY  2006   MITRAL VALVE REPAIR N/A 01/10/2024   Procedure: REPAIR, MITRAL VALVE;  Surgeon: Kerrin Elspeth BROCKS, MD;  Location: [facility] OR;  Service: Open Heart Surgery;  Laterality: N/A;   RIGHT HEART CATH N/A 02/05/2024   Procedure: RIGHT HEART CATH;  Surgeon: Zenaida Morene PARAS, MD;  Location: [facility] INVASIVE CV LAB;  Service: Cardiovascular;  Laterality: N/A;   RIGHT/LEFT HEART CATH AND CORONARY ANGIOGRAPHY N/A 01/04/2024   Procedure: RIGHT/LEFT HEART CATH AND CORONARY ANGIOGRAPHY;  Surgeon: Verlin Lonni BIRCH, MD;  Location: [facility] INVASIVE CV LAB;  Service: Cardiovascular;  Laterality: N/A;   TOOTH EXTRACTION N/A 01/07/2024   Procedure: DENTAL RESTORATION/EXTRACTIONS;  Surgeon: Sheryle Hamilton, DMD;  Location: [facility] OR;  Service: Oral Surgery;  Laterality: N/A;  EXTRACTION OF TEETH #2, #6, #7, #16, #29   TRANSESOPHAGEAL ECHOCARDIOGRAM (CATH LAB) N/A 01/01/2024   Procedure: TRANSESOPHAGEAL ECHOCARDIOGRAM;  Surgeon: Lonni,  Bridgette, MD;  Location: [facility] INVASIVE CV LAB;  Service: Cardiovascular;  Laterality: N/A;   TRANSESOPHAGEAL ECHOCARDIOGRAM (CATH LAB) N/A 01/22/2024   Procedure: TRANSESOPHAGEAL ECHOCARDIOGRAM;  Surgeon: Rolan Ezra RAMAN, MD;  Location[facility] INVAS[facility] CV LAB;  Service: Cardiovascular;  Laterality: N/A;     Social History:   reports that she has been smoking cigarettes. She has never used smokeless tobacco. She reports current alcohol use. She reports that she does not use drugs.   Family History:  Her family history includes Diabetes in her father; Hypertension in her mother. There is no history of Breast cancer.   Allergies Allergies  Allergen Reactions   Codeine Anaphylaxis and Nausea And Vomiting   Red Dye #40 (Allura Red) Nausea Only     Home Medications  Prior to Admission medications   Medication Sig Start Date End Date Taking? Authorizing  Provider  acetaminophen  (TYLENOL ) 325 MG tablet Take 650 mg by mouth daily as needed for fever. 12/14/23  Yes [provider]  clonazePAM  (KLONOPIN ) 0.5 MG tablet Take 0.25-0.5 mg by mouth at bedtime as needed for anxiety. 06/07/21  Yes [provider]  fluconazole  (DIFLUCAN ) IVPB Inject 400 mg into the vein daily for 22 days. Indication:  Candida endocarditis  First Dose: Yes Last Day of Therapy:  02/21/24 Labs - Once weekly:  CBC/D and BMP, Labs - Once weekly: ESR and CRP 01/30/24 02/21/24 Yes Vu, Constance DASEN, MD  ondansetron  (ZOFRAN ) 4 MG tablet Take 1 tablet (4 mg total) by mouth every 8 (eight) hours as needed for nausea or vomiting. 11/08/23  Yes McDonough, Lauren K, PA-C  pantoprazole  (PROTONIX ) 20 MG tablet Take 1 tablet (20 mg total) by mouth daily. 11/08/23  Yes McDonough, Lauren K, PA-C  lovastatin  (MEVACOR ) 20 MG tablet Take 1 tablet (20 mg total) by mouth at bedtime. Patient not taking: Reported on 12/29/2023 07/09/23   Kristina Tinnie POUR, PA-C    Critical care time: 50 minutes    Brynnan Rodenbaugh, DNP, AGACNP-BC  Pulmonary & Critical Care  Please see [email or web address] for pager details.  From 7A-7P if no response, please call [phone]. After hours, please call ELink [phone].

## 2024-02-08 NOTE — H&P (View-Only) (Signed)
 Advanced Heart Failure Rounding Note  Cardiologist: Lonni Cash, MD  Chief Complaint: S/p MV Repair / MV endocarditis  Patient Profile   Tracy Baker is a 68 y.o. female with hx of recent nephrolithiasis s/p ureteral stent and lithotripsy at [facility] 10/10 with stent removal. 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 Post-op course c/b post cardiotomy shock, junctional bradycardia, afib w/ RVR and HAP.   Subjective:    RHC 12/2 (on 0.125 of Milrinone ): RA 4, PA 42/13 (22), PCW 12 with Vwaves to 20, CO/CI TD 3.37/2.03, PAPi 7.5  12/5 S/P Redo MVR - Intra Op given  4UPRBCs, 2UPLTs, 2FFP, 1Cyro On Norepi 2 mcg + milrinone  0.25 mcg + Epi 6 mcg.   SVO2 41%  CO 1.9 CI 1.1   Objective:    Weight Range: 64.3 kg Body mass index is 24.33 kg/m.   Vital Signs:   Temp:  [97.5 F (36.4 C)-97.6 F (36.4 C)] 97.5 F (36.4 C) (12/05 0524) Pulse Rate:  [57-72] 57 (12/05 0729) Resp:  [12-20] 15 (12/05 0729) BP: (100-130)/(56-66) 117/66 (12/05 0725) SpO2:  [89 %-97 %] 96 % (12/05 0729) Weight:  [64.3 kg] 64.3 kg (12/05 0524) Last BM Date : 02/06/24  Weight change: Filed Weights   02/05/24 0432 02/07/24 0429 02/08/24 0524  Weight: 61.4 kg 63.2 kg 64.3 kg   Intake/Output:  Intake/Output Summary (Last 24 hours) at 02/08/2024 0834 Last data filed at 02/08/2024 0520 Gross per 24 hour  Intake 120 ml  Output 550 ml  Net -430 ml    Physical Exam  General:  Intubated Cor: Regular rate & rhythm.  Lungs: clear MT/CTs Extremities: no  edema Neuro: Intubated  GU: Foley    Telemetry   SR  Labs    CBC Recent Labs    02/08/24 0601  WBC 7.5  HGB 8.8*  HCT 28.8*  MCV 107.9*  PLT 176   Basic Metabolic Panel Recent Labs    87/95/74 0426 02/08/24 0601  NA 140 141  K 3.5 4.0  CL 106 103  CO2 27 25  GLUCOSE 109* 171*  BUN 30* 24*  CREATININE 1.26* 1.15*  CALCIUM  8.0* 8.5*  MG 2.0 2.1   Liver Function Tests No results for  input(s): AST, ALT, ALKPHOS, BILITOT, PROT, ALBUMIN  in the last 72 hours.  Medications:    Scheduled Medications:  [MAR Hold] amiodarone   200 mg Oral BID   [MAR Hold] Chlorhexidine  Gluconate Cloth  6 each Topical Daily   [MAR Hold] docusate sodium   200 mg Oral Daily   epinephrine   0-10 mcg/min Intravenous To OR   [MAR Hold] feeding supplement  237 mL Oral BID BM   heparin  sodium (porcine) 2,500 Units, papaverine  30 mg in electrolyte-A (PLASMALYTE-A PH 7.4) 500 mL irrigation   Irrigation To OR   insulin    Intravenous To OR   [facility] Blood Cardioplegia vial (lidocaine /magnesium /mannitol  0.26g-4g-6.4g)   Intracoronary To OR   methadone   0.2 mg/kg (Ideal) Intravenous Once   [MAR Hold] mirtazapine   15 mg Oral QHS   [MAR Hold] mupirocin  ointment   Nasal BID   [MAR Hold] pantoprazole   40 mg Oral Daily   phenylephrine   30-200 mcg/min Intravenous To OR   potassium chloride   80 mEq Other To OR   [MAR Hold] sodium chloride   2 spray Each Nare QID   [MAR Hold] sodium chloride  flush  10-40 mL Intracatheter Q12H   [MAR Hold] sodium chloride  flush  3 mL Intravenous  Q12H   [MAR Hold] thiamine   100 mg Oral Daily   tranexamic acid   15 mg/kg Intravenous To OR   tranexamic acid   2 mg/kg Intracatheter To OR    Infusions:  [MAR Hold]  ceFAZolin  (ANCEF ) IV 1 g (02/08/24 0524)    ceFAZolin  (ANCEF ) IV      ceFAZolin  (ANCEF ) IV     dexmedetomidine      [MAR Hold] fluconazole  (DIFLUCAN ) IV 400 mg (02/07/24 0903)   heparin  30,000 units/NS 1000 mL solution for CELLSAVER     milrinone  0.125 mcg/kg/min (02/07/24 1818)   milrinone      nitroGLYCERIN      tranexamic acid  (CYKLOKAPRON ) 2,500 mg in sodium chloride  0.9 % 250 mL (10 mg/mL) infusion     vancomycin       PRN Medications: [MAR Hold] bisacodyl  **OR** [DISCONTINUED] bisacodyl , [MAR Hold] clonazePAM , [MAR Hold] magic mouthwash, [MAR Hold] menthol , [MAR Hold] ondansetron  (ZOFRAN ) IV, [MAR Hold] mouth rinse, [MAR Hold] sodium chloride , [MAR  Hold] sodium chloride  flush  Assessment/Plan   1. 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 Kerrin for Candida endocarditis. Pre-op L/RHC 01/04/24 with no CAD and normal L/R heart pressures.  - Intra-op TEE LVEF 60-65%, RV mildly reduced, small immobile mass on anterior valve leaflet, mod MR, trivial TR - Failed initial Milrinone  wean. Eventually weaned off, unfortunately restarted 12/1 for low output.  - TEE 11/18  with severe mitral regurgitation of repaired valve - RHC 12/2 (on 0.125 of milrinone ): normal R/L filling pressures (RA 4, PCW 12), significant MR, and moderately reduced output by TD 2.03. S/P Redo MVR- -On Norepi 2 mcg, Milrinone  0.25 mcg, Epi 6 mcg. Slowly wean she tolerates.  - Check CO-OX now.   2. Transient Junctional Brady  - resolved  3. Atrial fibrillation, post-op - continue amio 200 mg bid  - holding apixaban   per TCTS  4. AKI on CKD 3b - Scr baseline 1.4-1.5 -Follow    5. Protein-calorie malnutrition  6. Epistaxis  - s/p bilateral nasal packing per ENT 12/1   - on empiric TSS ppx w ancef  (d/w PharmD)   - Off AC  Length of stay: 37  Barney Gertsch, NP  02/08/24, 8:34 AM  Advanced Heart Failure Team Pager [phone] (M-F; 7a - 5p)  Please contact [facility] for night-coverage after hours (4p -7a ) and weekends on [email or web address]

## 2024-02-08 NOTE — Anesthesia Preprocedure Evaluation (Addendum)
 Anesthesia Evaluation  Patient identified by MRN, date of birth, ID band Patient awake    Reviewed: Allergy & Precautions, NPO status , Patient's Chart, lab work & pertinent test results  Airway Mallampati: II  TM Distance: >3 FB Neck ROM: Full    Dental no notable dental hx.    Pulmonary Current Smoker and Patient abstained from smoking.   Pulmonary exam normal        Cardiovascular  Rhythm:Regular Rate:Normal  ECHO 11/25: IMPRESSIONS      1. Left ventricular ejection fraction, by estimation, is 60 to 65%. The left ventricle has normal function. The left ventricle has no regional wall motion abnormalities. There is mild concentric left ventricular hypertrophy.  2. Right ventricular systolic function is mildly reduced. The right ventricular size is mildly enlarged. Tricuspid regurgitation signal is inadequate for assessing PA pressure.  3. Left atrial size was mildly dilated. No left atrial/left atrial appendage thrombus was detected.  4. There is no PFO or ASD by color doppler. The interatrial septum is thickened with mobile filamentous material and nodular outcroppings noted on the left atrial side of the septum. The septum was crossed and then repaired with sutures during the mitral valve repair surgery.  5. The patient is s/p mitral valve repair with annuloplasty ring and neochords. Inadequate coaptation of the mitral valve leaflets that appears to be caused by restriction of the posterior leaflet. There is severe eccentric posteriorly-directed mitral regurgitation. Mean gradient 3 mmHg. There is systolic flow reversal in the pulmonary vein doppler pattern. PISA ERO 0.72 cm^2.  6. The aortic valve is tricuspid. Aortic valve regurgitation is not visualized. No aortic stenosis is present.  7. Grade 3 plaque in the descending thoracic aorta.  8. 3D performed of the mitral valve.  Cath 12/25: MPRESSION: Right heart catheterization  on 0.125 of milrinone  Normal right and left heart filling pressures Evidence of significant MR by PCWP tracing Moderately reduced output by thermodilution, would use over Fick given significant anemia HEMODYNAMICS: RA:       4 mmHg (mean) RV:       42/2, 6 mmHg PA:       42/12 mmHg (22 mean) PCWP: 12 mmHg (mean) with v waves to 20    Estimated Fick CO/CI   5.54L/min, 3.33L/min/m2 Thermodilution CO/CI   3.37L/min, 2.03L/min/m2    Neuro/Psych negative neurological ROS  negative psych ROS   GI/Hepatic negative GI ROS, Neg liver ROS,,,  Endo/Other  negative endocrine ROS    Renal/GU   negative genitourinary   Musculoskeletal negative musculoskeletal ROS (+)    Abdominal Normal abdominal exam  (+)   Peds  Hematology Lab Results      Component                Value               Date                      WBC                      8.8                 02/05/2024                HGB                      8.5 (L)  02/05/2024                HCT                      25.0 (L)            02/05/2024                MCV                      104.3 (H)           02/05/2024                PLT                      202                 02/05/2024             Lab Results      Component                Value               Date                      NA                       141                 02/08/2024                K                        4.0                 02/08/2024                CO2                      25                  02/08/2024                GLUCOSE                  171 (H)             02/08/2024                BUN                      24 (H)              02/08/2024                CREATININE               1.15 (H)            02/08/2024                CALCIUM                   8.5 (L)             02/08/2024                EGFR  97                  06/13/2023                GFRNONAA                 52 (L)              02/08/2024              Anesthesia  Other Findings   Reproductive/Obstetrics                              Anesthesia Physical Anesthesia Plan  ASA: 4  Anesthesia Plan: General   Post-op Pain Management:    Induction: Intravenous  PONV Risk Score and Plan: 2 and Treatment may vary due to age or medical condition and Midazolam   Airway Management Planned: Mask and Oral ETT  Additional Equipment: Arterial line, CVP, PA Cath, TEE, 3D TEE and Ultrasound Guidance Line Placement  Intra-op Plan:   Post-operative Plan: Post-operative intubation/ventilation  Informed Consent: I have reviewed the patients History and Physical, chart, labs and discussed the procedure including the risks, benefits and alternatives for the proposed anesthesia with the patient or authorized representative who has indicated his/her understanding and acceptance.     Dental advisory given  Plan Discussed with: CRNA  Anesthesia Plan Comments: (- CCO -MAC introducer)         Anesthesia Quick Evaluation

## 2024-02-08 NOTE — Anesthesia Procedure Notes (Signed)
 Procedure Name: Intubation Date/Time: 02/08/2024 8:00 AM  Performed by: Harrold Macintosh, CRNAPre-anesthesia Checklist: Patient identified, Emergency Drugs available, Suction available and Patient being monitored Patient Re-evaluated:Patient Re-evaluated prior to induction Oxygen Delivery Method: Circle system utilized Preoxygenation: Pre-oxygenation with 100% oxygen Induction Type: IV induction Ventilation: Mask ventilation without difficulty Laryngoscope Size: Miller and 2 Grade View: Grade I Tube type: Oral Tube size: 8.0 mm Number of attempts: 1 Airway Equipment and Method: Stylet Placement Confirmation: ETT inserted through vocal cords under direct vision, positive ETCO2 and breath sounds checked- equal and bilateral Secured at: 21 cm Tube secured with: Tape Dental Injury: Teeth and Oropharynx as per pre-operative assessment

## 2024-02-08 NOTE — Interval H&P Note (Signed)
 History and Physical Interval Note:  02/08/2024 7:10 AM  Tracy Baker  has presented today for surgery, with the diagnosis of SEVERE MR.  The various methods of treatment have been discussed with the patient and family. After consideration of risks, benefits and other options for treatment, the patient has consented to  Procedure(s): REDO STERNOTOMY (N/A) REPLACEMENT, MITRAL VALVE (N/A) ECHOCARDIOGRAM, TRANSESOPHAGEAL, INTRAOPERATIVE (N/A) as a surgical intervention.  The patient's history has been reviewed, patient examined, no change in status, stable for surgery.  I have reviewed the patient's chart and labs.  Questions were answered to the patient's satisfaction.     [name]

## 2024-02-08 NOTE — Plan of Care (Signed)
 Problem: Education: Goal: Knowledge of General Education information will improve Description: Including pain rating scale, medication(s)/side effects and non-pharmacologic comfort measures Outcome: Progressing   Problem: Health Behavior/Discharge Planning: Goal: Ability to manage health-related needs will improve Outcome: Progressing   Problem: Clinical Measurements: Goal: Ability to maintain clinical measurements within normal limits will improve Outcome: Progressing Goal: Will remain free from infection Outcome: Progressing Goal: Diagnostic test results will improve Outcome: Progressing Goal: Respiratory complications will improve Outcome: Progressing Goal: Cardiovascular complication will be avoided Outcome: Progressing   Problem: Activity: Goal: Risk for activity intolerance will decrease Outcome: Progressing   Problem: Nutrition: Goal: Adequate nutrition will be maintained Outcome: Progressing   Problem: Coping: Goal: Level of anxiety will decrease Outcome: Progressing   Problem: Elimination: Goal: Will not experience complications related to bowel motility Outcome: Progressing Goal: Will not experience complications related to urinary retention Outcome: Progressing   Problem: Pain Managment: Goal: General experience of comfort will improve and/or be controlled Outcome: Progressing   Problem: Safety: Goal: Ability to remain free from injury will improve Outcome: Progressing   Problem: Skin Integrity: Goal: Risk for impaired skin integrity will decrease Outcome: Progressing   Problem: Education: Goal: Understanding of CV disease, CV risk reduction, and recovery process will improve Outcome: Progressing Goal: Individualized Educational Video(s) Outcome: Progressing   Problem: Activity: Goal: Ability to return to baseline activity level will improve Outcome: Progressing   Problem: Cardiovascular: Goal: Ability to achieve and maintain adequate  cardiovascular perfusion will improve Outcome: Progressing Goal: Vascular access site(s) Level 0-1 will be maintained Outcome: Progressing   Problem: Health Behavior/Discharge Planning: Goal: Ability to safely manage health-related needs after discharge will improve Outcome: Progressing   Problem: Education: Goal: Will demonstrate proper wound care and an understanding of methods to prevent future damage Outcome: Progressing Goal: Knowledge of disease or condition will improve Outcome: Progressing Goal: Knowledge of the prescribed therapeutic regimen will improve Outcome: Progressing Goal: Individualized Educational Video(s) Outcome: Progressing   Problem: Activity: Goal: Risk for activity intolerance will decrease Outcome: Progressing   Problem: Cardiac: Goal: Will achieve and/or maintain hemodynamic stability Outcome: Progressing   Problem: Clinical Measurements: Goal: Postoperative complications will be avoided or minimized Outcome: Progressing   Problem: Respiratory: Goal: Respiratory status will improve Outcome: Progressing   Problem: Skin Integrity: Goal: Wound healing without signs and symptoms of infection Outcome: Progressing Goal: Risk for impaired skin integrity will decrease Outcome: Progressing   Problem: Urinary Elimination: Goal: Ability to achieve and maintain adequate renal perfusion and functioning will improve Outcome: Progressing   Problem: Education: Goal: Ability to describe self-care measures that may prevent or decrease complications (Diabetes Survival Skills Education) will improve Outcome: Progressing Goal: Individualized Educational Video(s) Outcome: Progressing   Problem: Coping: Goal: Ability to adjust to condition or change in health will improve Outcome: Progressing   Problem: Fluid Volume: Goal: Ability to maintain a balanced intake and output will improve Outcome: Progressing   Problem: Health Behavior/Discharge  Planning: Goal: Ability to identify and utilize available resources and services will improve Outcome: Progressing Goal: Ability to manage health-related needs will improve Outcome: Progressing   Problem: Metabolic: Goal: Ability to maintain appropriate glucose levels will improve Outcome: Progressing   Problem: Nutritional: Goal: Maintenance of adequate nutrition will improve Outcome: Progressing Goal: Progress toward achieving an optimal weight will improve Outcome: Progressing   Problem: Skin Integrity: Goal: Risk for impaired skin integrity will decrease Outcome: Progressing   Problem: Tissue Perfusion: Goal: Adequacy of tissue perfusion will improve Outcome: Progressing

## 2024-02-08 NOTE — Progress Notes (Signed)
 Advanced Heart Failure Rounding Note  Cardiologist: Lonni Cash, MD  Chief Complaint: S/p MV Repair / MV endocarditis  Patient Profile   Tracy Baker is a 68 y.o. female with hx of recent nephrolithiasis s/p ureteral stent and lithotripsy at [facility] 10/10 with stent removal. 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 Post-op course c/b post cardiotomy shock, junctional bradycardia, afib w/ RVR and HAP.   Subjective:    RHC 12/2 (on 0.125 of Milrinone ): RA 4, PA 42/13 (22), PCW 12 with Vwaves to 20, CO/CI TD 3.37/2.03, PAPi 7.5  12/5 S/P Redo MVR - Intra Op given  4UPRBCs, 2UPLTs, 2FFP, 1Cyro On Norepi 2 mcg + milrinone  0.25 mcg + Epi 6 mcg.   SVO2 41%  CO 1.9 CI 1.1   Objective:    Weight Range: 64.3 kg Body mass index is 24.33 kg/m.   Vital Signs:   Temp:  [97.5 F (36.4 C)-97.6 F (36.4 C)] 97.5 F (36.4 C) (12/05 0524) Pulse Rate:  [57-72] 57 (12/05 0729) Resp:  [12-20] 15 (12/05 0729) BP: (100-130)/(56-66) 117/66 (12/05 0725) SpO2:  [89 %-97 %] 96 % (12/05 0729) Weight:  [64.3 kg] 64.3 kg (12/05 0524) Last BM Date : 02/06/24  Weight change: Filed Weights   02/05/24 0432 02/07/24 0429 02/08/24 0524  Weight: 61.4 kg 63.2 kg 64.3 kg   Intake/Output:  Intake/Output Summary (Last 24 hours) at 02/08/2024 0834 Last data filed at 02/08/2024 0520 Gross per 24 hour  Intake 120 ml  Output 550 ml  Net -430 ml    Physical Exam  General:  Intubated Cor: Regular rate & rhythm.  Lungs: clear MT/CTs Extremities: no  edema Neuro: Intubated  GU: Foley    Telemetry   SR  Labs    CBC Recent Labs    02/08/24 0601  WBC 7.5  HGB 8.8*  HCT 28.8*  MCV 107.9*  PLT 176   Basic Metabolic Panel Recent Labs    87/95/74 0426 02/08/24 0601  NA 140 141  K 3.5 4.0  CL 106 103  CO2 27 25  GLUCOSE 109* 171*  BUN 30* 24*  CREATININE 1.26* 1.15*  CALCIUM  8.0* 8.5*  MG 2.0 2.1   Liver Function Tests No results for  input(s): AST, ALT, ALKPHOS, BILITOT, PROT, ALBUMIN  in the last 72 hours.  Medications:    Scheduled Medications:  [MAR Hold] amiodarone   200 mg Oral BID   [MAR Hold] Chlorhexidine  Gluconate Cloth  6 each Topical Daily   [MAR Hold] docusate sodium   200 mg Oral Daily   epinephrine   0-10 mcg/min Intravenous To OR   [MAR Hold] feeding supplement  237 mL Oral BID BM   heparin  sodium (porcine) 2,500 Units, papaverine  30 mg in electrolyte-A (PLASMALYTE-A PH 7.4) 500 mL irrigation   Irrigation To OR   insulin    Intravenous To OR   [facility] Blood Cardioplegia vial (lidocaine /magnesium /mannitol  0.26g-4g-6.4g)   Intracoronary To OR   methadone   0.2 mg/kg (Ideal) Intravenous Once   [MAR Hold] mirtazapine   15 mg Oral QHS   [MAR Hold] mupirocin  ointment   Nasal BID   [MAR Hold] pantoprazole   40 mg Oral Daily   phenylephrine   30-200 mcg/min Intravenous To OR   potassium chloride   80 mEq Other To OR   [MAR Hold] sodium chloride   2 spray Each Nare QID   [MAR Hold] sodium chloride  flush  10-40 mL Intracatheter Q12H   [MAR Hold] sodium chloride  flush  3 mL Intravenous  Q12H   [MAR Hold] thiamine   100 mg Oral Daily   tranexamic acid   15 mg/kg Intravenous To OR   tranexamic acid   2 mg/kg Intracatheter To OR    Infusions:  [MAR Hold]  ceFAZolin  (ANCEF ) IV 1 g (02/08/24 0524)    ceFAZolin  (ANCEF ) IV      ceFAZolin  (ANCEF ) IV     dexmedetomidine      [MAR Hold] fluconazole  (DIFLUCAN ) IV 400 mg (02/07/24 0903)   heparin  30,000 units/NS 1000 mL solution for CELLSAVER     milrinone  0.125 mcg/kg/min (02/07/24 1818)   milrinone      nitroGLYCERIN      tranexamic acid  (CYKLOKAPRON ) 2,500 mg in sodium chloride  0.9 % 250 mL (10 mg/mL) infusion     vancomycin       PRN Medications: [MAR Hold] bisacodyl  **OR** [DISCONTINUED] bisacodyl , [MAR Hold] clonazePAM , [MAR Hold] magic mouthwash, [MAR Hold] menthol , [MAR Hold] ondansetron  (ZOFRAN ) IV, [MAR Hold] mouth rinse, [MAR Hold] sodium chloride , [MAR  Hold] sodium chloride  flush  Assessment/Plan   1. 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 Kerrin for Candida endocarditis. Pre-op L/RHC 01/04/24 with no CAD and normal L/R heart pressures.  - Intra-op TEE LVEF 60-65%, RV mildly reduced, small immobile mass on anterior valve leaflet, mod MR, trivial TR - Failed initial Milrinone  wean. Eventually weaned off, unfortunately restarted 12/1 for low output.  - TEE 11/18  with severe mitral regurgitation of repaired valve - RHC 12/2 (on 0.125 of milrinone ): normal R/L filling pressures (RA 4, PCW 12), significant MR, and moderately reduced output by TD 2.03. S/P Redo MVR- -On Norepi 2 mcg, Milrinone  0.25 mcg, Epi 6 mcg. Slowly wean she tolerates.  - Check CO-OX now.   2. Transient Junctional Brady  - resolved  3. Atrial fibrillation, post-op - continue amio 200 mg bid  - holding apixaban   per TCTS  4. AKI on CKD 3b - Scr baseline 1.4-1.5 -Follow    5. Protein-calorie malnutrition  6. Epistaxis  - s/p bilateral nasal packing per ENT 12/1   - on empiric TSS ppx w ancef  (d/w PharmD)   - Off AC  Length of stay: 37  Barney Gertsch, NP  02/08/24, 8:34 AM  Advanced Heart Failure Team Pager [phone] (M-F; 7a - 5p)  Please contact [facility] for night-coverage after hours (4p -7a ) and weekends on [email or web address]

## 2024-02-08 NOTE — Transfer of Care (Signed)
 Immediate Anesthesia Transfer of Care Note  Patient: Tracy Baker  Procedure(s) Performed: REDO STERNOTOMY REPLACEMENT, MITRAL VALVE WITH MITRIS RESILIA MITRAL VALVE 27mm ECHOCARDIOGRAM, TRANSESOPHAGEAL, INTRAOPERATIVE  Patient Location: SICU  Anesthesia Type:General  Level of Consciousness: sedated and Patient remains intubated per anesthesia plan  Airway & Oxygen Therapy: Patient remains intubated per anesthesia plan and Patient placed on Ventilator (see vital sign flow sheet for setting)  Post-op Assessment: Report given to RN and Post -op Vital signs reviewed and stable  Post vital signs: Reviewed and stable  Last Vitals:  Vitals Value Taken Time  BP 135/66   Temp 36.3 C 02/08/24 14:13  Pulse 80   Resp 16 02/08/24 14:13  SpO2 96 % 02/08/24 13:50  Vitals shown include unfiled device data.  Last Pain:  Vitals:   02/08/24 0524  TempSrc: Oral  PainSc:       Patients Stated Pain Goal: 0 (01/26/24 0400)  Complications: No notable events documented.

## 2024-02-08 NOTE — Brief Op Note (Signed)
 12/28/2023 - 02/08/2024  1:34 PM  PATIENT:  Tracy Baker  68 y.o. female  PRE-OPERATIVE DIAGNOSIS:  SEVERE MITRAL REGURGITATION  POST-OPERATIVE DIAGNOSIS:  SEVERE MITRAL REGURGITATION  PROCEDURE:   REDO STERNOTOMY  REPLACEMENT, MITRAL VALVE WITH MITRIS RESILIA MITRAL VALVE 27mm  ECHOCARDIOGRAM, TRANSESOPHAGEAL, INTRAOPERATIVE   SURGEON:  Surgeons and Role:    DEWAINE Kerrin Elspeth JAYSON, MD - Primary  PHYSICIAN ASSISTANT: Con Bend PA-C  ASSISTANTS: Mariel Mayotte RNFA   ANESTHESIA:   general  EBL:  Per perfusion records  BLOOD ADMINISTERED: 4U PRBCS, 2U PLT, 2U FFP, 1U Cryo  DRAINS: mediastinal and pleural drains   LOCAL MEDICATIONS USED:  NONE  SPECIMEN:  Source of Specimen:  Anterior mitral valve leaflet  DISPOSITION OF SPECIMEN:  PATHOLOGY  COUNTS:  YES  TOURNIQUET:  * No tourniquets in log *  DICTATION: .Dragon Dictation  PLAN OF CARE: Admit to inpatient   PATIENT DISPOSITION:  ICU - intubated and hemodynamically stable.   Delay start of Pharmacological VTE agent (>24hrs) due to surgical blood loss or risk of bleeding: yes

## 2024-02-08 NOTE — Anesthesia Procedure Notes (Signed)
 Central Venous Catheter Insertion Performed by: Dorethea Cordella SQUIBB, DO, anesthesiologist Start/End12/07/2023 7:06 AM, 02/08/2024 7:16 AM Patient location: Pre-op. Preanesthetic checklist: patient identified, IV checked, site marked, risks and benefits discussed, surgical consent, monitors and equipment checked, pre-op evaluation, timeout performed and anesthesia consent Position: Trendelenburg Lidocaine  1% used for infiltration and patient sedated Hand hygiene performed  and maximum sterile barriers used  Catheter size: 9 Fr Central line was placed.MAC introducer Procedure performed using ultrasound to evaluate access site. Ultrasound Notes:relevant anatomy identified, ultrasound used to visualize needle entry, vessel patent under ultrasound and image(s) printed for medical record. Attempts: 1 Following insertion, line sutured and dressing applied. Post procedure assessment: blood return through all ports, free fluid flow and no air  Patient tolerated the procedure well with no immediate complications.

## 2024-02-09 ENCOUNTER — Inpatient Hospital Stay (HOSPITAL_COMMUNITY)

## 2024-02-09 ENCOUNTER — Encounter (HOSPITAL_COMMUNITY)
Admission: EM | Disposition: A | Discharge status: Rehabilitation Facility | Payer: Self-pay | Source: Home / Self Care | Attending: Thoracic Surgery (Cardiothoracic Vascular Surgery)

## 2024-02-09 HISTORY — PX: ECMO CANNULATION: CATH118321

## 2024-02-09 LAB — POCT I-STAT 7, (LYTES, BLD GAS, ICA,H+H)
Acid-base deficit: 4 mmol/L — ABNORMAL HIGH (ref 0.0–2.0)
Acid-base deficit: 2 mmol/L (ref 0.0–2.0)
Acid-base deficit: 2 mmol/L (ref 0.0–2.0)
Acid-base deficit: 1 mmol/L (ref 0.0–2.0)
Acid-base deficit: 1 mmol/L (ref 0.0–2.0)
Acid-base deficit: 1 mmol/L (ref 0.0–2.0)
Bicarbonate: 21.7 mmol/L (ref 20.0–28.0)
Bicarbonate: 22.1 mmol/L (ref 20.0–28.0)
Bicarbonate: 20.4 mmol/L (ref 20.0–28.0)
Bicarbonate: 22.1 mmol/L (ref 20.0–28.0)
Bicarbonate: 23.2 mmol/L (ref 20.0–28.0)
Bicarbonate: 23.8 mmol/L (ref 20.0–28.0)
Calcium, Ion: 1.22 mmol/L (ref 1.15–1.40)
Calcium, Ion: 1.17 mmol/L (ref 1.15–1.40)
Calcium, Ion: 0.96 mmol/L — ABNORMAL LOW (ref 1.15–1.40)
Calcium, Ion: 1.03 mmol/L — ABNORMAL LOW (ref 1.15–1.40)
Calcium, Ion: 0.98 mmol/L — ABNORMAL LOW (ref 1.15–1.40)
Calcium, Ion: 1.04 mmol/L — ABNORMAL LOW (ref 1.15–1.40)
HCT: 29 % — ABNORMAL LOW (ref 36.0–46.0)
HCT: 28 % — ABNORMAL LOW (ref 36.0–46.0)
HCT: 28 % — ABNORMAL LOW (ref 36.0–46.0)
HCT: 29 % — ABNORMAL LOW (ref 36.0–46.0)
HCT: 26 % — ABNORMAL LOW (ref 36.0–46.0)
HCT: 29 % — ABNORMAL LOW (ref 36.0–46.0)
Hemoglobin: 9.9 g/dL — ABNORMAL LOW (ref 12.0–15.0)
Hemoglobin: 9.5 g/dL — ABNORMAL LOW (ref 12.0–15.0)
Hemoglobin: 9.5 g/dL — ABNORMAL LOW (ref 12.0–15.0)
Hemoglobin: 9.9 g/dL — ABNORMAL LOW (ref 12.0–15.0)
Hemoglobin: 8.8 g/dL — ABNORMAL LOW (ref 12.0–15.0)
Hemoglobin: 9.9 g/dL — ABNORMAL LOW (ref 12.0–15.0)
O2 Saturation: 93 %
O2 Saturation: 93 %
O2 Saturation: 100 %
O2 Saturation: 100 %
O2 Saturation: 96 %
O2 Saturation: 99 %
Patient temperature: 37.8
Patient temperature: 38
Patient temperature: 36.5
Patient temperature: 36.5
Patient temperature: 36.6
Patient temperature: 36.6
Potassium: 4 mmol/L (ref 3.5–5.1)
Potassium: 4.1 mmol/L (ref 3.5–5.1)
Potassium: 4.3 mmol/L (ref 3.5–5.1)
Potassium: 4.2 mmol/L (ref 3.5–5.1)
Potassium: 4.1 mmol/L (ref 3.5–5.1)
Potassium: 4.2 mmol/L (ref 3.5–5.1)
Sodium: 142 mmol/L (ref 135–145)
Sodium: 144 mmol/L (ref 135–145)
Sodium: 141 mmol/L (ref 135–145)
Sodium: 143 mmol/L (ref 135–145)
Sodium: 141 mmol/L (ref 135–145)
Sodium: 141 mmol/L (ref 135–145)
TCO2: 23 mmol/L (ref 22–32)
TCO2: 23 mmol/L (ref 22–32)
TCO2: 21 mmol/L — ABNORMAL LOW (ref 22–32)
TCO2: 23 mmol/L (ref 22–32)
TCO2: 24 mmol/L (ref 22–32)
TCO2: 25 mmol/L (ref 22–32)
pCO2 arterial: 42.8 mmHg (ref 32–48)
pCO2 arterial: 37 mmHg (ref 32–48)
pCO2 arterial: 25.5 mmHg — ABNORMAL LOW (ref 32–48)
pCO2 arterial: 29.7 mmHg — ABNORMAL LOW (ref 32–48)
pCO2 arterial: 36.5 mmHg (ref 32–48)
pCO2 arterial: 38.3 mmHg (ref 32–48)
pH, Arterial: 7.316 — ABNORMAL LOW (ref 7.35–7.45)
pH, Arterial: 7.389 (ref 7.35–7.45)
pH, Arterial: 7.51 — ABNORMAL HIGH (ref 7.35–7.45)
pH, Arterial: 7.478 — ABNORMAL HIGH (ref 7.35–7.45)
pH, Arterial: 7.409 (ref 7.35–7.45)
pH, Arterial: 7.4 (ref 7.35–7.45)
pO2, Arterial: 78 mmHg — ABNORMAL LOW (ref 83–108)
pO2, Arterial: 70 mmHg — ABNORMAL LOW (ref 83–108)
pO2, Arterial: 260 mmHg — ABNORMAL HIGH (ref 83–108)
pO2, Arterial: 288 mmHg — ABNORMAL HIGH (ref 83–108)
pO2, Arterial: 83 mmHg (ref 83–108)
pO2, Arterial: 143 mmHg — ABNORMAL HIGH (ref 83–108)

## 2024-02-09 LAB — BPAM FFP
Blood Product Expiration Date: 202512052359
Blood Product Expiration Date: 202512052359
Blood Product Expiration Date: 202512052359
Blood Product Expiration Date: 202512082359
ISSUE DATE / TIME: 202512051114
ISSUE DATE / TIME: 202512051114
ISSUE DATE / TIME: 202512051504
ISSUE DATE / TIME: 202512051504
Unit Type and Rh: 6200
Unit Type and Rh: 6200
Unit Type and Rh: 6200
Unit Type and Rh: 6200

## 2024-02-09 LAB — BASIC METABOLIC PANEL WITH GFR
Anion gap: 13 (ref 5–15)
BUN: 23 mg/dL (ref 8–23)
CO2: 23 mmol/L (ref 22–32)
Calcium: 7.7 mg/dL — ABNORMAL LOW (ref 8.9–10.3)
Chloride: 104 mmol/L (ref 98–111)
Creatinine, Ser: 1.75 mg/dL — ABNORMAL HIGH (ref 0.44–1.00)
GFR, Estimated: 31 mL/min — ABNORMAL LOW (ref >60)
Glucose, Bld: 119 mg/dL — ABNORMAL HIGH (ref 70–99)
Potassium: 4.2 mmol/L (ref 3.5–5.1)
Sodium: 140 mmol/L (ref 135–145)

## 2024-02-09 LAB — COMPREHENSIVE METABOLIC PANEL WITH GFR
ALT: 15 U/L (ref 0–44)
ALT: 12 U/L (ref 0–44)
AST: 105 U/L — ABNORMAL HIGH (ref 15–41)
AST: 99 U/L — ABNORMAL HIGH (ref 15–41)
Albumin: 3.4 g/dL — ABNORMAL LOW (ref 3.5–5.0)
Albumin: 2.5 g/dL — ABNORMAL LOW (ref 3.5–5.0)
Alkaline Phosphatase: 81 U/L (ref 38–126)
Alkaline Phosphatase: 56 U/L (ref 38–126)
Anion gap: 14 (ref 5–15)
Anion gap: 16 — ABNORMAL HIGH (ref 5–15)
BUN: 22 mg/dL (ref 8–23)
BUN: 22 mg/dL (ref 8–23)
CO2: 20 mmol/L — ABNORMAL LOW (ref 22–32)
CO2: 18 mmol/L — ABNORMAL LOW (ref 22–32)
Calcium: 9.2 mg/dL (ref 8.9–10.3)
Calcium: 7.5 mg/dL — ABNORMAL LOW (ref 8.9–10.3)
Chloride: 106 mmol/L (ref 98–111)
Chloride: 106 mmol/L (ref 98–111)
Creatinine, Ser: 1.78 mg/dL — ABNORMAL HIGH (ref 0.44–1.00)
Creatinine, Ser: 1.7 mg/dL — ABNORMAL HIGH (ref 0.44–1.00)
GFR, Estimated: 31 mL/min — ABNORMAL LOW (ref >60)
GFR, Estimated: 32 mL/min — ABNORMAL LOW (ref >60)
Glucose, Bld: 148 mg/dL — ABNORMAL HIGH (ref 70–99)
Glucose, Bld: 97 mg/dL (ref 70–99)
Potassium: 4.1 mmol/L (ref 3.5–5.1)
Potassium: 4.3 mmol/L (ref 3.5–5.1)
Sodium: 140 mmol/L (ref 135–145)
Sodium: 140 mmol/L (ref 135–145)
Total Bilirubin: 1.6 mg/dL — ABNORMAL HIGH (ref 0.0–1.2)
Total Bilirubin: 0.9 mg/dL (ref 0.0–1.2)
Total Protein: 5.1 g/dL — ABNORMAL LOW (ref 6.5–8.1)
Total Protein: 3.6 g/dL — ABNORMAL LOW (ref 6.5–8.1)

## 2024-02-09 LAB — PREPARE FRESH FROZEN PLASMA
Unit division: 0
Unit division: 0

## 2024-02-09 LAB — CBC
HCT: 31 % — ABNORMAL LOW (ref 36.0–46.0)
HCT: 29.4 % — ABNORMAL LOW (ref 36.0–46.0)
HCT: 27 % — ABNORMAL LOW (ref 36.0–46.0)
Hemoglobin: 10.3 g/dL — ABNORMAL LOW (ref 12.0–15.0)
Hemoglobin: 10.2 g/dL — ABNORMAL LOW (ref 12.0–15.0)
Hemoglobin: 9.3 g/dL — ABNORMAL LOW (ref 12.0–15.0)
MCH: 31.7 pg (ref 26.0–34.0)
MCH: 31 pg (ref 26.0–34.0)
MCH: 31.6 pg (ref 26.0–34.0)
MCHC: 33.2 g/dL (ref 30.0–36.0)
MCHC: 34.7 g/dL (ref 30.0–36.0)
MCHC: 34.4 g/dL (ref 30.0–36.0)
MCV: 95.4 fL (ref 80.0–100.0)
MCV: 89.4 fL (ref 80.0–100.0)
MCV: 91.8 fL (ref 80.0–100.0)
Platelets: 145 K/uL — ABNORMAL LOW (ref 150–400)
Platelets: 39 K/uL — ABNORMAL LOW (ref 150–400)
Platelets: 99 K/uL — ABNORMAL LOW (ref 150–400)
RBC: 3.25 MIL/uL — ABNORMAL LOW (ref 3.87–5.11)
RBC: 3.29 MIL/uL — ABNORMAL LOW (ref 3.87–5.11)
RBC: 2.94 MIL/uL — ABNORMAL LOW (ref 3.87–5.11)
RDW: 24.6 % — ABNORMAL HIGH (ref 11.5–15.5)
RDW: 18.8 % — ABNORMAL HIGH (ref 11.5–15.5)
RDW: 20.4 % — ABNORMAL HIGH (ref 11.5–15.5)
WBC: 13.1 K/uL — ABNORMAL HIGH (ref 4.0–10.5)
WBC: 6.7 K/uL (ref 4.0–10.5)
WBC: 6.9 K/uL (ref 4.0–10.5)
nRBC: 0.3 % — ABNORMAL HIGH (ref 0.0–0.2)
nRBC: 0.4 % — ABNORMAL HIGH (ref 0.0–0.2)
nRBC: 1.5 % — ABNORMAL HIGH (ref 0.0–0.2)

## 2024-02-09 LAB — GLUCOSE, CAPILLARY
Glucose-Capillary: 107 mg/dL — ABNORMAL HIGH (ref 70–99)
Glucose-Capillary: 111 mg/dL — ABNORMAL HIGH (ref 70–99)
Glucose-Capillary: 119 mg/dL — ABNORMAL HIGH (ref 70–99)
Glucose-Capillary: 119 mg/dL — ABNORMAL HIGH (ref 70–99)
Glucose-Capillary: 119 mg/dL — ABNORMAL HIGH (ref 70–99)
Glucose-Capillary: 124 mg/dL — ABNORMAL HIGH (ref 70–99)
Glucose-Capillary: 128 mg/dL — ABNORMAL HIGH (ref 70–99)
Glucose-Capillary: 131 mg/dL — ABNORMAL HIGH (ref 70–99)
Glucose-Capillary: 137 mg/dL — ABNORMAL HIGH (ref 70–99)
Glucose-Capillary: 142 mg/dL — ABNORMAL HIGH (ref 70–99)
Glucose-Capillary: 146 mg/dL — ABNORMAL HIGH (ref 70–99)
Glucose-Capillary: 173 mg/dL — ABNORMAL HIGH (ref 70–99)
Glucose-Capillary: 80 mg/dL (ref 70–99)
Glucose-Capillary: 96 mg/dL (ref 70–99)

## 2024-02-09 LAB — POCT ACTIVATED CLOTTING TIME: Activated Clotting Time: 158 s

## 2024-02-09 LAB — BPAM PLATELET PHERESIS
Blood Product Expiration Date: 202512072359
Blood Product Expiration Date: 202512082359
ISSUE DATE / TIME: 202512051121
ISSUE DATE / TIME: 202512051121
Unit Type and Rh: 6200
Unit Type and Rh: 6200

## 2024-02-09 LAB — PREPARE PLATELET PHERESIS
Unit division: 0
Unit division: 0

## 2024-02-09 LAB — LACTATE DEHYDROGENASE: LDH: 489 U/L — ABNORMAL HIGH (ref 105–235)

## 2024-02-09 LAB — COOXEMETRY PANEL
Carboxyhemoglobin: 1.1 % (ref 0.5–1.5)
Methemoglobin: <0.7 % (ref 0.0–1.5)
O2 Saturation: 54.5 %
Total hemoglobin: 11.1 g/dL — ABNORMAL LOW (ref 12.0–16.0)

## 2024-02-09 LAB — MAGNESIUM
Magnesium: 2.8 mg/dL — ABNORMAL HIGH (ref 1.7–2.4)
Magnesium: 2.3 mg/dL (ref 1.7–2.4)
Magnesium: 2.5 mg/dL — ABNORMAL HIGH (ref 1.7–2.4)

## 2024-02-09 LAB — APTT: aPTT: >200 s (ref 24–36)

## 2024-02-09 LAB — CG4 I-STAT (LACTIC ACID)
Lactic Acid, Venous: 3.5 mmol/L (ref 0.5–1.9)
Lactic Acid, Venous: 3.4 mmol/L (ref 0.5–1.9)
Lactic Acid, Venous: 3 mmol/L (ref 0.5–1.9)
Lactic Acid, Venous: 1.5 mmol/L (ref 0.5–1.9)

## 2024-02-09 LAB — VANCOMYCIN, RANDOM: Vancomycin Rm: 21 ug/mL

## 2024-02-09 LAB — FIBRINOGEN: Fibrinogen: 221 mg/dL (ref 210–475)

## 2024-02-09 LAB — PROTIME-INR
INR: 3.9 — ABNORMAL HIGH (ref 0.8–1.2)
Prothrombin Time: 40.1 s — ABNORMAL HIGH (ref 11.4–15.2)

## 2024-02-09 SURGERY — ECMO CANNULATION
Anesthesia: LOCAL

## 2024-02-09 MED ORDER — DOBUTAMINE-DEXTROSE 4-5 MG/ML-% IV SOLN
INTRAVENOUS | Status: AC
  Administered 2024-02-09: 5 ug/kg/min via INTRAVENOUS
  Filled 2024-02-09: qty 250

## 2024-02-09 MED ORDER — HEPARIN SODIUM (PORCINE) 1000 UNIT/ML IJ SOLN
INTRAMUSCULAR | Status: AC
  Filled 2024-02-09: qty 10

## 2024-02-09 MED ORDER — FUROSEMIDE 10 MG/ML IJ SOLN
20.0000 mg/h | INTRAVENOUS | Status: DC
  Administered 2024-02-09 – 2024-02-11 (×5): 20 mg/h via INTRAVENOUS
  Filled 2024-02-09 (×3): qty 20
  Filled 2024-02-09: qty 200
  Filled 2024-02-09: qty 20

## 2024-02-09 MED ORDER — SODIUM CHLORIDE 0.9 % IV SOLN
0.5000 ug/min | INTRAVENOUS | Status: DC
  Filled 2024-02-09: qty 10

## 2024-02-09 MED ORDER — ALBUMIN HUMAN 5 % IV SOLN
INTRAVENOUS | Status: AC
  Filled 2024-02-09: qty 250

## 2024-02-09 MED ORDER — VASOPRESSIN 20 UNITS/100 ML INFUSION FOR SHOCK
0.0000 [IU]/min | INTRAVENOUS | Status: DC
  Administered 2024-02-09 – 2024-02-11 (×5): 0.04 [IU]/min via INTRAVENOUS
  Filled 2024-02-09 (×6): qty 100

## 2024-02-09 MED ORDER — VANCOMYCIN VARIABLE DOSE PER UNSTABLE RENAL FUNCTION (PHARMACIST DOSING): Status: DC

## 2024-02-09 MED ORDER — FUROSEMIDE 10 MG/ML IJ SOLN
80.0000 mg | Freq: Once | INTRAMUSCULAR | Status: AC
  Administered 2024-02-09: 80 mg via INTRAVENOUS
  Filled 2024-02-09: qty 8

## 2024-02-09 MED ORDER — FUROSEMIDE 10 MG/ML IJ SOLN
INTRAMUSCULAR | Status: AC
  Administered 2024-02-09: 80 mg via INTRAVENOUS
  Filled 2024-02-09: qty 4

## 2024-02-09 MED ORDER — SODIUM CHLORIDE 0.9% IV SOLUTION: Freq: Once | INTRAVENOUS | Status: AC

## 2024-02-09 MED ORDER — FUROSEMIDE 10 MG/ML IJ SOLN: 40.0000 mg | Freq: Once | INTRAMUSCULAR | Status: DC

## 2024-02-09 MED ORDER — FENTANYL 2500MCG IN NS 250ML (10MCG/ML) PREMIX INFUSION
0.0000 ug/h | INTRAVENOUS | Status: DC
  Administered 2024-02-09: 150 ug/h via INTRAVENOUS
  Administered 2024-02-09: 50 ug/h via INTRAVENOUS
  Administered 2024-02-10 – 2024-02-11 (×3): 200 ug/h via INTRAVENOUS
  Administered 2024-02-12: 125 ug/h via INTRAVENOUS
  Administered 2024-02-12: 200 ug/h via INTRAVENOUS
  Administered 2024-02-16 (×2): 25 ug/h via INTRAVENOUS
  Filled 2024-02-09 (×11): qty 250

## 2024-02-09 MED ORDER — SODIUM CHLORIDE 0.9 % IV SOLN
1.0000 g | Freq: Two times a day (BID) | INTRAVENOUS | Status: DC
  Administered 2024-02-10 – 2024-02-12 (×5): 1 g via INTRAVENOUS
  Filled 2024-02-09 (×5): qty 20

## 2024-02-09 MED ORDER — DOBUTAMINE-DEXTROSE 4-5 MG/ML-% IV SOLN: 5.0000 ug/kg/min | INTRAVENOUS | Status: DC

## 2024-02-09 MED ORDER — PANTOPRAZOLE SODIUM 40 MG IV SOLR
40.0000 mg | Freq: Every day | INTRAVENOUS | Status: DC
  Administered 2024-02-09 – 2024-02-20 (×12): 40 mg via INTRAVENOUS
  Filled 2024-02-09 (×12): qty 10

## 2024-02-09 MED ORDER — FUROSEMIDE 10 MG/ML IJ SOLN: 80.0000 mg | Freq: Once | INTRAMUSCULAR | Status: AC

## 2024-02-09 MED ORDER — ACETAMINOPHEN 160 MG/5ML PO SOLN: 1000.0000 mg | Freq: Four times a day (QID) | ORAL | Status: DC

## 2024-02-09 MED ORDER — MIDAZOLAM BOLUS VIA INFUSION
0.0000 mg | INTRAVENOUS | Status: DC | PRN
  Administered 2024-02-09 – 2024-02-10 (×4): 2 mg via INTRAVENOUS
  Administered 2024-02-10: 1 mg via INTRAVENOUS

## 2024-02-09 MED ORDER — LACTATED RINGERS IV SOLN: INTRAVENOUS | Status: DC

## 2024-02-09 MED ORDER — HEPARIN (PORCINE) IN NACL 2000-0.9 UNIT/L-% IV SOLN
INTRAVENOUS | Status: DC | PRN
  Administered 2024-02-09: 1000 mL

## 2024-02-09 MED ORDER — AMIODARONE HCL IN DEXTROSE 360-4.14 MG/200ML-% IV SOLN
30.0000 mg/h | INTRAVENOUS | Status: DC
  Administered 2024-02-09 – 2024-02-10 (×3): 30 mg/h via INTRAVENOUS
  Filled 2024-02-09: qty 400
  Filled 2024-02-09 (×2): qty 200

## 2024-02-09 MED ORDER — LIDOCAINE HCL 1 % IJ SOLN
INTRAMUSCULAR | Status: AC
  Filled 2024-02-09: qty 20

## 2024-02-09 MED ORDER — CALCIUM GLUCONATE-NACL 1-0.675 GM/50ML-% IV SOLN: 1.0000 g | Freq: Once | INTRAVENOUS | Status: DC

## 2024-02-09 MED ORDER — SODIUM CHLORIDE 0.9 % IV SOLN
1.0000 g | Freq: Once | INTRAVENOUS | Status: AC
  Administered 2024-02-09: 1 g via INTRAVENOUS
  Filled 2024-02-09: qty 20

## 2024-02-09 MED ORDER — FENTANYL BOLUS VIA INFUSION
25.0000 ug | INTRAVENOUS | Status: DC | PRN
  Administered 2024-02-09 – 2024-02-10 (×4): 100 ug via INTRAVENOUS
  Administered 2024-02-13: 25 ug via INTRAVENOUS
  Administered 2024-02-13: 100 ug via INTRAVENOUS
  Administered 2024-02-14 – 2024-02-15 (×2): 25 ug via INTRAVENOUS
  Administered 2024-02-15 – 2024-02-16 (×3): 50 ug via INTRAVENOUS
  Administered 2024-02-16: 100 ug via INTRAVENOUS
  Administered 2024-02-16: 50 ug via INTRAVENOUS
  Administered 2024-02-16: 100 ug via INTRAVENOUS
  Administered 2024-02-16: 50 ug via INTRAVENOUS
  Administered 2024-02-16: 25 ug via INTRAVENOUS
  Administered 2024-02-16 (×3): 50 ug via INTRAVENOUS
  Administered 2024-02-16: 25 ug via INTRAVENOUS
  Administered 2024-02-16 (×4): 50 ug via INTRAVENOUS
  Administered 2024-02-16: 75 ug via INTRAVENOUS
  Administered 2024-02-16 (×2): 50 ug via INTRAVENOUS
  Administered 2024-02-17: 19:00:00 100 ug via INTRAVENOUS
  Administered 2024-02-17: 75 ug via INTRAVENOUS
  Administered 2024-02-17 – 2024-02-18 (×2): 50 ug via INTRAVENOUS

## 2024-02-09 MED ORDER — POLYETHYLENE GLYCOL 3350 17 G PO PACK
17.0000 g | PACK | Freq: Every day | ORAL | Status: DC
  Administered 2024-02-10 – 2024-02-11 (×2): 17 g
  Filled 2024-02-09 (×2): qty 1

## 2024-02-09 MED ORDER — FENTANYL CITRATE (PF) 50 MCG/ML IJ SOSY
25.0000 ug | PREFILLED_SYRINGE | Freq: Once | INTRAMUSCULAR | Status: AC
  Administered 2024-02-09: 25 ug via INTRAVENOUS

## 2024-02-09 MED ORDER — VASOPRESSIN 20 UNITS/100 ML INFUSION FOR SHOCK
INTRAVENOUS | Status: AC
  Administered 2024-02-09: 0.04 [IU]/min via INTRAVENOUS
  Filled 2024-02-09: qty 100

## 2024-02-09 MED ORDER — CALCIUM GLUCONATE-NACL 1-0.675 GM/50ML-% IV SOLN
1.0000 g | Freq: Once | INTRAVENOUS | Status: AC
  Administered 2024-02-09: 1000 mg via INTRAVENOUS
  Filled 2024-02-09: qty 50

## 2024-02-09 MED ORDER — MIDAZOLAM-SODIUM CHLORIDE 100-0.9 MG/100ML-% IV SOLN
0.0000 mg/h | INTRAVENOUS | Status: DC
  Administered 2024-02-09: 2 mg/h via INTRAVENOUS
  Administered 2024-02-10 (×2): 4 mg/h via INTRAVENOUS
  Filled 2024-02-09 (×3): qty 100

## 2024-02-09 MED ORDER — SODIUM BICARBONATE 8.4 % IV SOLN
INTRAVENOUS | Status: AC
  Administered 2024-02-09: 50 meq via INTRAVENOUS
  Filled 2024-02-09: qty 50

## 2024-02-09 MED ORDER — HEPARIN SODIUM (PORCINE) 1000 UNIT/ML IJ SOLN
INTRAMUSCULAR | Status: DC | PRN
  Administered 2024-02-09: 5000 [IU] via INTRAVENOUS

## 2024-02-09 MED ORDER — DEXTROSE IN LACTATED RINGERS 5 % IV SOLN: INTRAVENOUS | Status: DC

## 2024-02-09 MED ORDER — ACETAMINOPHEN 10 MG/ML IV SOLN
1000.0000 mg | Freq: Four times a day (QID) | INTRAVENOUS | Status: AC
  Administered 2024-02-09 – 2024-02-10 (×4): 1000 mg via INTRAVENOUS
  Filled 2024-02-09 (×4): qty 100

## 2024-02-09 MED ORDER — SENNOSIDES-DOCUSATE SODIUM 8.6-50 MG PO TABS
2.0000 | ORAL_TABLET | Freq: Every day | ORAL | Status: DC
  Administered 2024-02-10 – 2024-02-12 (×3): 2
  Filled 2024-02-09 (×3): qty 2

## 2024-02-09 MED ORDER — SODIUM BICARBONATE 8.4 % IV SOLN: 50.0000 meq | Freq: Once | INTRAVENOUS | Status: AC

## 2024-02-09 MED ORDER — ALBUMIN HUMAN 5 % IV SOLN
12.5000 g | Freq: Once | INTRAVENOUS | Status: AC
  Administered 2024-02-09: 12.5 g via INTRAVENOUS
  Filled 2024-02-09 (×2): qty 250

## 2024-02-09 SURGICAL SUPPLY — 9 items
KIT CATH LUMEN 2 9FR 11.5CM ST (CATHETERS) IMPLANT
KIT MICROPUNCTURE NIT STIFF (SHEATH) IMPLANT
KIT PROCEDURE PROTEKDUO DL31 (CATHETERS) IMPLANT
KIT VASCULAR ACCESS OPUS (MISCELLANEOUS) IMPLANT
SET TUBING HLS ADVANCED 7.0 (TUBING) IMPLANT
SHEATH PINNACLE 7F 10CM (SHEATH) IMPLANT
SHEATH PROBE COVER 6X72 (BAG) IMPLANT
SHIELD CATH-GARD CONTAMINATION (MISCELLANEOUS) IMPLANT
WIRE AMPLATZ SS-J .035X260CM (WIRE) IMPLANT

## 2024-02-09 NOTE — Interval H&P Note (Signed)
 History and Physical Interval Note:  02/09/2024 10:33 AM  Tracy Baker  has presented today for surgery, with the diagnosis of hf.  The various methods of treatment have been discussed with the patient and family. After consideration of risks, benefits and other options for treatment, the patient has consented to  Procedure(s): ECMO CANNULATION (N/A) as a surgical intervention.  The patient's history has been reviewed, patient examined, no change in status, stable for surgery.  I have reviewed the patient's chart and labs.  Questions were answered to the patient's satisfaction.     Tracy Baker

## 2024-02-09 NOTE — Progress Notes (Signed)
 ECMO INITIATION   Patient: Tracy Baker, 12-04-1955, 68 y.o. Location:   Date of Service:  02/09/2024     Time: 12:15 PM  Date of Admission: 12/28/2023 Admitting diagnosis: S/P MVR (mitral valve replacement)  Ht: 5' 4 (162.6 cm) Wt: 76.7 kg BSA: Body surface area is 1.86 meters squared.  Blood Type: A POS Allergies:  Allergies  Allergen Reactions   Codeine Anaphylaxis and Nausea And Vomiting   Red Dye #40 (Allura Red) Nausea Only    Past medical history:  Past Medical History:  Diagnosis Date   Thyroid  disease    Past surgical history:  Past Surgical History:  Procedure Laterality Date   APPENDECTOMY     AUGMENTATION MAMMAPLASTY Bilateral 1981   removed in 2006   BREAST IMPLANT REMOVAL Bilateral 2007   CARDIOVERSION N/A 01/22/2024   Procedure: CARDIOVERSION;  Surgeon: Rolan Ezra RAMAN, MD;  Location: [facility] INVASIVE CV LAB;  Service: Cardiovascular;  Laterality: N/A;   CHOLECYSTECTOMY  2010   COLONOSCOPY WITH PROPOFOL  N/A 06/30/2021   Procedure: COLONOSCOPY WITH PROPOFOL ;  Surgeon: Jinny Carmine, MD;  Location: [facility] ENDOSCOPY;  Service: Endoscopy;  Laterality: N/A;   CONTROL OF EPISTAXIS  02/04/2024   INTRAOPERATIVE TRANSESOPHAGEAL ECHOCARDIOGRAM N/A 01/10/2024   Procedure: ECHOCARDIOGRAM, TRANSESOPHAGEAL, INTRAOPERATIVE;  Surgeon: Kerrin Elspeth BROCKS, MD;  Location: [facility] OR;  Service: Open Heart Surgery;  Laterality: N/A;   LAPAROSCOPIC TOTAL HYSTERECTOMY  2006   MITRAL VALVE REPAIR N/A 01/10/2024   Procedure: REPAIR, MITRAL VALVE;  Surgeon: Kerrin Elspeth BROCKS, MD;  Location: [facility] OR;  Service: Open Heart Surgery;  Laterality: N/A;   RIGHT HEART CATH N/A 02/05/2024   Procedure: RIGHT HEART CATH;  Surgeon: Zenaida Morene PARAS, MD;  Location: [facility] INVASIVE CV LAB;  Service: Cardiovascular;  Laterality: N/A;   RIGHT/LEFT HEART CATH AND CORONARY ANGIOGRAPHY N/A 01/04/2024   Procedure: RIGHT/LEFT HEART CATH AND CORONARY ANGIOGRAPHY;  Surgeon: Verlin Lonni BIRCH, MD;  Location:  [facility] CV LAB;  Service: Cardiovascular;  Laterality: N/A;   TOOTH EXTRACTION N/A 01/07/2024   Procedure: DENTAL RESTORATION/EXTRACTIONS;  Surgeon: Sheryle Hamilton, DMD;  Location: [facility] OR;  Service: Oral Surgery;  Laterality: N/A;  EXTRACTION OF TEETH #2, #6, #7, #16, #29   TRANSESOPHAGEAL ECHOCARDIOGRAM (CATH LAB) N/A 01/01/2024   Procedure: TRANSESOPHAGEAL ECHOCARDIOGRAM;  Surgeon: Lonni Slain, MD;  Location: [facility] INVASIVE CV LAB;  Service: Cardiovascular;  Laterality: N/A;   TRANSESOPHAGEAL ECHOCARDIOGRAM (CATH LAB) N/A 01/22/2024   Procedure: TRANSESOPHAGEAL ECHOCARDIOGRAM;  Surgeon: Rolan Ezra RAMAN, MD;  Location: [facility] INVASIVE CV LAB;  Service: Cardiovascular;  Laterality: N/A;    Indication for ECMO: Cardiogenic Shock  ECMO was deployed at 1030am and initiated at 1135am  Anticoagulation achieved with Heparin  bolus of 5,000 units given to patient at 1131am . Cannulated for   and achieved initial ECMO   and ECMO  .    ECMO Cannula Information     Staff Present  Primary Perfusionist   Assisting Perfusionist/ECMO Specialist [place] Skii Cleland RN Kendell Nunnery RN   Cannulating Physician Odis Zenaida MD Rolan Fuel MD    ECMO Lot Numbers  CardioHelp Console  [id number]  Oxygenator  [id number]  Tubing Pack  [id number]  ECMO Goals  Flow goal    2-3 LPM  Anticoagulation goal   N/A   Cardiac goal    > 65 MAP  Respiratory goal    > 88% O2 Sat  Other goal    PA02 100   ECMO Handoff  Patient Information * Age Height Weight BSA IBW BMI  68 y.o. 5' 4 (162.6 cm)  (76.7 kg Body surface area is 1.86 meters squared. 54.5 kg  Body mass index is 29.02 kg/m.   Review History * Primary Diagnosis   S/P MVR (mitral valve replacement)  Prior Cardiac Arrest within 24hrs of ECMO initiation?   ECMO and MCS * Type ECMO Flow ECMO Sweep Gases    RA-PA 2.0LPM    2 sweep         Additional Mechanical Support   Ventilation *    $ Ventilator Initial/Subsequent : Subsequent,  Vent Mode: SIMV; PSV; PRVC, Vt Set: 440 mL, Set Rate: 20 bmp, FiO2 (%): 100 %, I Time: 0.9 Sec(s), PEEP: 5 cmH20     Cannula Size and Locations     31 Fr Proteck RIJ   Drainage Dual lumen    Return Dual Lumen     *Cannula(e) sutured and anchored, secured and dressed.   Labs and Imaging *  *Cannulation position verified via imaging on arrival to ICU. Concerns communicated to attending surgeon. Labs reviewed.   All ECMO safety checks complete. ECMO flowsheet initiated, applicable charges captured, LDA's entered/confirmed, imaging and labs verified, blood products available, and report given to [employer].

## 2024-02-09 NOTE — Consult Note (Addendum)
 NAME:  Tracy Baker, MRN:  [id number], DOB:  Jan 16, 1956, LOS: 38 ADMISSION DATE:  12/28/2023, CONSULTATION DATE:  02/08/24 REFERRING MD:  Kerrin RAMAN., CHIEF COMPLAINT:  s/p MVR   History of Present Illness:  Tracy Baker is a 68 year old female with past medical history significant for nephrolithiasis s/p ureteral stent and lithotripsy at [facility] 10/10, with stent removal, severe MR, MV repair (01/10/24) due to MV Candida endocarditis c/b continued severe MR, who presents today for a redo sternotomy and MVR w/ Dr. Kerrin. PCCM consulted for ICU post-op vent management.  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 Kerrin  Interim History / Subjective:  Overnight patient started requiring more vasopressor support, early morning she was on dobutamine  5, milrinone  0.375, epinephrine  at 10, vasopressin  0.04 and norepinephrine  34 mics, she is not making urine Decision was made to proceed with Protek duo cannula for RV support, patient was taken to Cath Lab and underwent Protek duo cannula, tolerated well  Objective    Blood pressure 122/63, pulse 80, temperature (!) 100.6 F (38.1 C), resp. rate (!) 0, height 5' 4 (1.626 m), weight 76.7 kg, SpO2 100%. PAP: (20-45)/(8-32) 45/32 CVP:  [6 mmHg-35 mmHg] 6 mmHg PCWP:  [10 mmHg-16 mmHg] 16 mmHg CO:  [2.1 L/min-3.1 L/min] 2.3 L/min CI:  [1.2 L/min/m2-1.8 L/min/m2] 1.4 L/min/m2  Vent Mode: SIMV;PRVC;PSV FiO2 (%):  [50 %-100 %] 100 % Set Rate:  [16 bmp-20 bmp] 16 bmp Vt Set:  [440 mL] 440 mL PEEP:  [5 cmH20-8 cmH20] 5 cmH20 Pressure Support:  [10 cmH20] 10 cmH20 Plateau Pressure:  [19 cmH20-22 cmH20] 22 cmH20   Intake/Output Summary (Last 24 hours) at 02/09/2024 1338 Last data filed at  02/09/2024 0900 Gross per 24 hour  Intake 5349.67 ml  Output 2019 ml  Net 3330.67 ml   Filed Weights   02/07/24 0429 02/08/24 0524 02/09/24 0500  Weight: 63.2 kg 64.3 kg 76.7 kg     Physical exam: General: Crtitically ill-appearing elderly female, orally intubated HEENT: [place]/AT, eyes anicteric.  ETT and OGT in place.  Protek duo cannula noted on right IJ Neuro: Sedated, not following commands.  Eyes are closed.  Pupils 3 mm bilateral reactive to light Chest: Central sternotomy incision showing some bloody discharge on the dressing, no active bleeding noted coarse breath sounds, no wheezes or rhonchi.  Mediastinal, chest tube and pacer wire noted Heart: Paced rhythm, no murmurs or gallops Abdomen: Soft, nondistended, bowel sounds present  Labs and images reviewed  Patient Lines/Drains/Airways Status     Active Line/Drains/Airways     Name Placement date Placement time Site Days   Arterial Line 02/08/24 Right Radial 02/08/24  0700  Radial  1   Peripheral IV 02/08/24 14 G  1.25 Left Antecubital 02/08/24  0700  Antecubital  1   PICC Double Lumen 01/15/24 Right Basilic 35 cm 0 cm 01/15/24  8384  -- 25   ECMO Dual Lumen Cannula 02/09/24  1135  -- less than 1   NG/OG Vented/Dual Lumen 16 Fr. Oral 02/08/24  1333  Oral  1   Urethral Catheter [name], RN Latex;Straight-tip;Temperature probe 14 Fr. 02/08/24  0800  Latex;Straight-tip;Temperature probe  1   Y Chest Tube 1, 2, and 3 1 Right Pleural 28 Fr. 2 Left  Pleural 28 Fr. 3 Anterior Mediastinal 36 Fr. 02/08/24  1255  -- 1   Airway 8 mm 02/08/24  0800  -- 1   Pulmonary Artery Catheter 02/08/24 47 02/08/24  0700  -- 1   Pulmonary Artery Catheter 02/09/24 Left 02/09/24  1051  -- less than 1   Wound 01/07/24 0814 Surgical Closed Surgical Incision Lip Other (Comment) 01/07/24  0814  Lip  33   Wound 01/10/24 0735 Surgical Closed Surgical Incision Chest Other (Comment) 01/10/24  0735  Chest  30   Wound 01/16/24 0800 Traumatic Back Left;Upper  01/16/24  0800  Back  24            Resolved Problem List:   Assessment and Plan:  MV Candida endocarditis s/p MV repair 01/10/2024, Persistent severe mitral regurgitation and status post redo sternotomy and bioprosthetic MVR Acute RV failure with cardiogenic shock status post Protek duo ECMO cannula Acute respiratory failure with hypoxia AKI on CKD stage IIIa Expected perioperative acute blood loss anemia status post 4 unit PRBCs, 2 FFP's Thrombocytopenia due to CPB status post 2 unit platelets Prediabetes patient's hemoglobin A1c was 6 Severe protein calorie malnutrition  Continue Diflucan  Continue aspirin  Continue multimodal pain management Monitor chest tube output Patient is on Protek duo ECMO cannula with 2.5 L flow and sweep of 1 Trend ABGs and lactate Continue broad-spectrum antibiotics with vancomycin  and meropenem  Continue lung protective ventilation VAP prevention bundle in place PAD protocol with Versed  and fentanyl  Monitor intake and output Avoid nephrotoxic agent Continue aggressive diuresis Titrate vasopressor support with MAP goal 65 Monitor H&H and transfuse if hemoglobin less than 8 Platelet counts are low into 40s, watch for signs of bleeding Continue sliding scale insulin  CBG goal 140-180 Continue dietary supplements   Labs:  CBC: Recent Labs  Lab 02/05/24 0420 02/05/24 0830 02/08/24 0601 02/08/24 0752 02/08/24 1133 02/08/24 1237 02/08/24 1400 02/08/24 1402 02/08/24 1944 02/08/24 1946 02/09/24 0203 02/09/24 0315 02/09/24 0749 02/09/24 1313  WBC 8.8  --  7.5  --   --   --  10.3  --  12.8*  --  13.1*  --   --   --   HGB 8.4*   < > 8.8*   < > 8.3*   < > 9.6*   < > 9.8* 9.5* 10.3* 9.9* 9.5* 9.5*  HCT 26.8*   < > 28.8*   < > 25.6*   < > 29.6*   < > 30.1* 28.0* 31.0* 29.0* 28.0* 28.0*  MCV 104.3*  --  107.9*  --   --   --  96.4  --  96.5  --  95.4  --   --   --   PLT 202  --  176  --  55*  --  107*  --  137*  --  145*  --   --   --    < > =  values in this interval not displayed.    Basic Metabolic Panel: Recent Labs  Lab 02/06/24 0430 02/07/24 0426 02/08/24 0601 02/08/24 0752 02/08/24 1016 02/08/24 1046 02/08/24 1118 02/08/24 1237 02/08/24 1240 02/08/24 1258 02/08/24 1944 02/08/24 1946 02/09/24 0203 02/09/24 0315 02/09/24 0749 02/09/24 1313  NA 139 140 141   < > 141   < > 141   < > 143   < > 142 143 140 142 144 141  K 3.4* 3.5 4.0   < > 5.2*   < > 4.5   < > 4.5   < >  4.1 4.1 4.1 4.0 4.1 4.3  CL 103 106 103   < > 99  --  99  --  103  --  105  --  106  --   --   --   CO2 28 27 25   --   --   --   --   --   --   --  19*  --  20*  --   --   --   GLUCOSE 108* 109* 171*   < > 198*  --  183*  --  182*  --  212*  --  148*  --   --   --   BUN 41* 30* 24*   < > 21  --  25*  --  25*  --  19  --  22  --   --   --   CREATININE 1.67* 1.26* 1.15*   < > 0.80  --  0.90  --  0.90  --  1.41*  --  1.78*  --   --   --   CALCIUM  8.5* 8.0* 8.5*  --   --   --   --   --   --   --  9.3  --  9.2  --   --   --   MG 2.1 2.0 2.1  --   --   --   --   --   --   --  3.3*  --  2.8*  --   --   --    < > = values in this interval not displayed.   GFR: Estimated Creatinine Clearance: 30.3 mL/min (A) (by C-G formula based on SCr of 1.78 mg/dL (H)). Recent Labs  Lab 02/08/24 0601 02/08/24 1400 02/08/24 1944 02/09/24 0203 02/09/24 0325 02/09/24 0756 02/09/24 1312  WBC 7.5 10.3 12.8* 13.1*  --   --   --   LATICACIDVEN  --   --   --   --  3.5* 3.4* 3.0*    Liver Function Tests: Recent Labs  Lab 02/09/24 0203  AST 105*  ALT 15  ALKPHOS 81  BILITOT 1.6*  PROT 5.1*  ALBUMIN  3.4*   No results for input(s): LIPASE, AMYLASE in the last 168 hours. No results for input(s): AMMONIA in the last 168 hours.  ABG    Component Value Date/Time   PHART 7.510 (H) 02/09/2024 1313   PCO2ART 25.5 (L) 02/09/2024 1313   PO2ART 260 (H) 02/09/2024 1313   HCO3 20.4 02/09/2024 1313   TCO2 21 (L) 02/09/2024 1313   ACIDBASEDEF 2.0 02/09/2024 1313    O2SAT 100 02/09/2024 1313    Coagulation Profile: Recent Labs  Lab 02/08/24 1400  INR 2.5*    Cardiac Enzymes: No results for input(s): CKTOTAL, CKMB, CKMBINDEX, TROPONINI in the last 168 hours.  HbA1C: Hgb A1c MFr Bld  Date/Time Value Ref Range Status  01/11/2024 12:00 PM 5.4 4.8 - 5.6 % Final    Comment:    (NOTE) Diagnosis of Diabetes The following HbA1c ranges recommended by the American Diabetes Association (ADA) may be used as an aid in the diagnosis of diabetes mellitus.  Hemoglobin             Suggested A1C NGSP%              Diagnosis  <5.7                   Non Diabetic  5.7-6.4  Pre-Diabetic  >6.4                   Diabetic  <7.0                   Glycemic control for                       adults with diabetes.    06/13/2023 08:10 AM 5.8 (H) 4.8 - 5.6 % Final    Comment:             Prediabetes: 5.7 - 6.4          Diabetes: >6.4          Glycemic control for adults with diabetes: <7.0    CBG: Recent Labs  Lab 02/09/24 0203 02/09/24 0315 02/09/24 0500 02/09/24 0700 02/09/24 0859  GLUCAP 142* 119* 111* 119* 107*    The patient is critically ill due to acute RV failure with cardiogenic shock status post Protek duo ECMO cannula/acute respiratory failure with hypoxia/acute kidney injury/status post mitral valve replacement. Critical care was necessary to treat or prevent imminent or life-threatening deterioration.  Critical care was time spent personally by me on the following activities: development of treatment plan with patient and/or surrogate as well as nursing, discussions with consultants, evaluation of patient's response to treatment, examination of patient, obtaining history from patient or surrogate, ordering and performing treatments and interventions, ordering and review of laboratory studies, ordering and review of radiographic studies, pulse oximetry, re-evaluation of patient's condition and participation in  multidisciplinary rounds.   During this encounter critical care time was devoted to patient care services described in this note for 72 minutes.     Valinda Novas, MD [facility] See Amion for pager If no response to pager, please call [phone] until 7pm After 7pm, Please call E-link [phone]

## 2024-02-09 NOTE — CV Procedure (Signed)
 ECMO NOTE:   Indication: Post operative RV failure   Initial cannulation date: 12/6   ECMO type: V-PA ECMO   Dual lumen inflow/return cannula:   1) 32 Fr Protek Duo catheter placed    ECMO events:   - Initial cannulation 12/6     Daily data:   Flow 2.4L RPM 3056 Sweep  1L    Plan:  Continue ECMO support. Start IV diuresis after cannula reposition Wean pressors as able Updated family     Discussed in multidisciplinary fashion on ECMO rounds with CCM, Cardiology, ECMO coordinator/specialist, RT, PharmD and nursing staff all present.

## 2024-02-09 NOTE — Progress Notes (Signed)
 EVENING ROUNDS NOTE :     [address] [place] [address]             [phone]               * Day of Surgery * Procedure(s) (LRB): ECMO CANNULATION (N/A)   Total Length of Stay:  LOS: 38 days  Events:   Protek placed Decreased chemical support Good flows after repositioning Low CT output    BP 122/63   Pulse 80   Temp 97.7 F (36.5 C)   Resp 11   Ht 5' 4 (1.626 m)   Wt 76.7 kg   SpO2 100%   BMI 29.02 kg/m   PAP: (21-45)/(9-32) 36/29 CVP:  [6 mmHg-23 mmHg] 18 mmHg CO:  [2.1 L/min-3.1 L/min] 2.3 L/min CI:  [1.2 L/min/m2-1.8 L/min/m2] 1.4 L/min/m2  Vent Mode: SIMV;PSV;PRVC FiO2 (%):  [40 %-100 %] 40 % Set Rate:  [16 bmp-20 bmp] 16 bmp Vt Set:  [440 mL] 440 mL PEEP:  [5 cmH20] 5 cmH20 Plateau Pressure:  [19 cmH20-24 cmH20] 24 cmH20   sodium chloride  10 mL/hr at 02/09/24 1800   acetaminophen  Stopped (02/09/24 0951)   amiodarone  30 mg/hr (02/09/24 1800)   dextrose  5% lactated ringers      epinephrine      fentaNYL  infusion INTRAVENOUS 150 mcg/hr (02/09/24 1800)   fluconazole  (DIFLUCAN ) IV 400 mg (02/09/24 0957)   furosemide  (LASIX ) 200 mg in dextrose  5 % 100 mL (2 mg/mL) infusion 20 mg/hr (02/09/24 1800)   insulin  0.9 Units/hr (02/09/24 1800)   lactated ringers      meropenem  (MERREM ) IV     [START ON 02/10/2024] meropenem  (MERREM ) IV     midazolam  4 mg/hr (02/09/24 1800)   milrinone  0.25 mcg/kg/min (02/09/24 1800)   norepinephrine  (LEVOPHED ) Adult infusion 4 mcg/min (02/09/24 1800)   vasopressin  0.04 Units/min (02/09/24 1800)    I/O last 3 completed shifts: In: 9517.6 [P.O.:120; I.V.:4232.1; Aonni:8395; NG/GT:140; IV Piggyback:3421.6] Out: 2849 [Urine:1024; Emesis/NG output:255; Blood:1100; Chest Tube:470]      Latest Ref Rng & Units 02/09/2024    5:24 PM 02/09/2024    4:29 PM 02/09/2024    2:26 PM  CBC  Hemoglobin 12.0 - 15.0 g/dL 9.9  8.8  9.9   Hematocrit 36.0 - 46.0 % 29.0  26.0  29.0        Latest Ref Rng & Units  02/09/2024    5:24 PM 02/09/2024    4:29 PM 02/09/2024    2:26 PM  BMP  Sodium 135 - 145 mmol/L 141  141  143   Potassium 3.5 - 5.1 mmol/L 4.2  4.1  4.2     ABG    Component Value Date/Time   PHART 7.400 02/09/2024 1724   PCO2ART 38.3 02/09/2024 1724   PO2ART 143 (H) 02/09/2024 1724   HCO3 23.8 02/09/2024 1724   TCO2 25 02/09/2024 1724   ACIDBASEDEF 1.0 02/09/2024 1724   O2SAT 99 02/09/2024 1724       Justinn Welter, MD 02/09/2024 6:18 PM

## 2024-02-09 NOTE — Progress Notes (Signed)
 2 units PRBC's given emergently in cath lab per MD Stoner:  Unit # [phone] at 1130 Unit # [phone] O at 1135   2 units PRBCs given emergently upon arrival to unit per MD Zenaida verified with Izetta Hint:  Unit # [id number] at 1220 Unit # [id number] P at 1230

## 2024-02-09 NOTE — Progress Notes (Signed)
 RT x 2 assisted with patient transport on vent and nitric oxide from 2H16 to cath lab without complications.

## 2024-02-09 NOTE — Progress Notes (Signed)
 Advanced Heart Failure Rounding Note  Cardiologist: Lonni Cash, MD  Chief Complaint: S/p MV Repair / MV endocarditis  Patient Profile   Tracy Baker is a 68 y.o. female with hx of recent nephrolithiasis s/p ureteral stent and lithotripsy at [facility] 10/10 with stent removal. 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 Post-op course c/b post cardiotomy shock, junctional bradycardia, afib w/ RVR and HAP.   Subjective:    RHC 12/2 (on 0.125 of Milrinone ): RA 4, PA 42/13 (22), PCW 12 with Vwaves to 20, CO/CI TD 3.37/2.03, PAPi 7.5  12/5 S/P Redo MVR - Intra Op given  4UPRBCs, 2UPLTs, 2FFP, 1Cyro  Rapidly escalating doses of vasopressors overnight, started on dual inotropes with minimal improvement. Hemodynamics consistent with severe RV failure including reduced PAPi, CI of 1.3, worsening oxygenation. Discussion with TCTS, will plan to place Protek Duo cannula for RV support.  After cannulation were able to rapidly wean down vasopressors, patient overall tolerated well. However, frequent low flow alarms and cannula position suspected to be around the SVC-RA junction.   Using sterile technique, the cannula was advanced at bedside. Flows came up immediately without continued drops. Will continue to wean vasopressors, hold additional fluid, and start IV diuresis. Family updated.    Objective:    Weight Range: 76.7 kg Body mass index is 29.02 kg/m.   Vital Signs:   Temp:  [95.2 F (35.1 C)-100.6 F (38.1 C)] 100.6 F (38.1 C) (12/06 0845) Pulse Rate:  [0-113] 80 (12/06 1310) Resp:  [0-32] 16 (12/06 1413) BP: (89-186)/(25-103) 122/63 (12/06 0845) SpO2:  [91 %-100 %] 100 % (12/06 1310) Arterial Line BP: (97-241)/(39-223) 116/70 (12/06 1310) FiO2 (%):  [50 %-100 %] 60 % (12/06 1413) Weight:  [76.7 kg] 76.7 kg (12/06 0500) Last BM Date : 02/06/24  Weight change: Filed Weights   02/07/24 0429 02/08/24 0524 02/09/24 0500  Weight: 63.2 kg  64.3 kg 76.7 kg   Intake/Output:  Intake/Output Summary (Last 24 hours) at 02/09/2024 1414 Last data filed at 02/09/2024 0900 Gross per 24 hour  Intake 4356.44 ml  Output 814 ml  Net 3542.44 ml    Physical Exam  GENERAL: Intubated PULM:  Ventilated breath sounds CARDIAC:  JVP: severely elevated         Regular rate with regular rhythm. Systolic murmur.  1+ edema. Cool extremities. ABDOMEN: Soft, non-tender, non-distended. NEUROLOGIC: Sedated    Telemetry   Paced rhythm, now at 80  Labs    CBC Recent Labs    02/09/24 0203 02/09/24 0315 02/09/24 1310 02/09/24 1313  WBC 13.1*  --  6.7  --   HGB 10.3*   < > 10.2* 9.5*  HCT 31.0*   < > 29.4* 28.0*  MCV 95.4  --  89.4  --   PLT 145*  --  39*  --    < > = values in this interval not displayed.   Basic Metabolic Panel Recent Labs    87/93/74 0203 02/09/24 0315 02/09/24 1310 02/09/24 1313  NA 140   < > 140 141  K 4.1   < > 4.3 4.3  CL 106  --  106  --   CO2 20*  --  18*  --   GLUCOSE 148*  --  97  --   BUN 22  --  22  --   CREATININE 1.78*  --  1.70*  --   CALCIUM  9.2  --  7.5*  --  MG 2.8*  --  2.3  --    < > = values in this interval not displayed.   Liver Function Tests Recent Labs    02/09/24 0203 02/09/24 1310  AST 105* 99*  ALT 15 12  ALKPHOS 81 56  BILITOT 1.6* 0.9  PROT 5.1* 3.6*  ALBUMIN  3.4* 2.5*    Medications:    Scheduled Medications:  sodium chloride    Intravenous Once   aspirin  EC  325 mg Oral Daily   Or   aspirin   324 mg Per Tube Daily   calcium  chloride  1 g Intravenous Once   Chlorhexidine  Gluconate Cloth  6 each Topical Daily   Chlorhexidine  Gluconate Cloth  6 each Topical Daily   furosemide        furosemide   80 mg Intravenous Once   metoCLOPramide  (REGLAN ) injection  10 mg Intravenous Q6H   mouth rinse  15 mL Mouth Rinse Q2H   pantoprazole  (PROTONIX ) IV  40 mg Intravenous QHS   polyethylene glycol  17 g Per Tube Daily   scopolamine   1 patch Transdermal Q72H    senna-docusate  2 tablet Per Tube QHS   sodium chloride  flush  10-40 mL Intracatheter Q12H   sodium chloride  flush  3 mL Intravenous Q12H   vancomycin  variable dose per unstable renal function (pharmacist dosing)   Does not apply See admin instructions    Infusions:  sodium chloride      acetaminophen  1,000 mg (02/09/24 0936)   albumin  human     albumin  human     amiodarone  30 mg/hr (02/09/24 0956)    ceFAZolin  (ANCEF ) IV Stopped (02/09/24 0537)   fentaNYL  infusion INTRAVENOUS 50 mcg/hr (02/09/24 0900)   fluconazole  (DIFLUCAN ) IV 400 mg (02/09/24 0957)   insulin  3.4 Units/hr (02/09/24 0900)   midazolam      milrinone  0.375 mcg/kg/min (02/09/24 0900)   norepinephrine  (LEVOPHED ) Adult infusion 34 mcg/min (02/09/24 0900)   vasopressin  0.04 Units/min (02/09/24 0900)    PRN Medications: albumin  human, dextrose , fentaNYL , fentaNYL  (SUBLIMAZE ) injection, furosemide , midazolam , midazolam  PF, mouth rinse, promethazine , sodium chloride  flush, sodium chloride  flush, traMADol   Assessment/Plan   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 Kerrin for Candida endocarditis. Pre-op L/RHC 01/04/24 with no CAD and normal L/R heart pressures.  - Failed initial Milrinone  wean. Eventually weaned off, unfortunately restarted 12/1 for low output.  - TEE 11/18  with severe mitral regurgitation of repaired valve - S/P Redo MVR complicated by rapidly worsening RV failure -Protek Cannulation 12/6 - Start IV diuresis, low threshold to start drip - Flows 2-2.5L, weaning off vasopressors - Will attempt to wean flows tomorrow - No anticoagulation with thrombocytopnea, coagulopathy - Check CO-OX now.  - Abx with vancomycin , pending clinical trajectory can consider meropenem   Coagulopathy/thrombocytopenia/ABLA anemia:  - Suspect post op, though low  platelets are concerning - Replete for goal >50 currentlly - CT look good - Hgb >8  Atrial fibrillation, post-op - continue amio 200 mg bid  - holding apixaban   per TCTS  AKI on CKD 3b - Scr baseline 1.4-1.5 -Follow    Protein-calorie malnutrition  Epistaxis  - s/p bilateral nasal packing per ENT 12/1   - on empiric TSS ppx w ancef  (d/w PharmD)   - Off AC  Length of stay: 85  [name], MD  02/09/24, 2:14 PM  CRITICAL CARE Performed by: Morene JINNY  Nakayla Rorabaugh   Total critical care time: 125 minutes  Critical care time was exclusive of separately billable procedures and treating other patients.  Critical care was necessary to treat or prevent imminent or life-threatening deterioration.  Critical care was time spent personally by me on the following activities: development of treatment plan with patient and/or surrogate as well as nursing, discussions with consultants, evaluation of patient's response to treatment, examination of patient, obtaining history from patient or surrogate, ordering and performing treatments and interventions, ordering and review of laboratory studies, ordering and review of radiographic studies, pulse oximetry and re-evaluation of patient's condition.

## 2024-02-09 NOTE — Progress Notes (Addendum)
 Pharmacy Antibiotic Note  Tracy Baker is a 68 y.o. female admitted on 12/28/2023 with ECMO.  Pharmacy has been consulted for merrem  dosing.  Pt placed on ECMO today. Currently on vanc. Dr. Zenaida would like to start empiric Merrem .   Vanc level came back 21 this PM. No dose needed tonight. Check level in Am   Plan: Merrem  1g IV q12 Vanc level around noon   Height: 5' 4 (162.6 cm) Weight: 76.7 kg (169 lb 1.5 oz) IBW/kg (Calculated) : 54.7  Temp (24hrs), Avg:99.4 F (37.4 C), Min:96.4 F (35.8 C), Max:100.6 F (38.1 C)  Recent Labs  Lab 02/08/24 0601 02/08/24 0806 02/08/24 1118 02/08/24 1240 02/08/24 1400 02/08/24 1944 02/09/24 0203 02/09/24 0325 02/09/24 0756 02/09/24 1310 02/09/24 1312 02/09/24 1605  WBC 7.5  --   --   --  10.3 12.8* 13.1*  --   --  6.7  --   --   CREATININE 1.15*   < > 0.90 0.90  --  1.41* 1.78*  --   --  1.70*  --   --   LATICACIDVEN  --   --   --   --   --   --   --  3.5* 3.4*  --  3.0*  --   VANCORANDOM  --   --   --   --   --   --   --   --   --   --   --  21   < > = values in this interval not displayed.    Estimated Creatinine Clearance: 31.8 mL/min (A) (by C-G formula based on SCr of 1.7 mg/dL (H)).    Allergies  Allergen Reactions   Codeine Anaphylaxis and Nausea And Vomiting   Red Dye #40 (Allura Red) Nausea Only    Antimicrobials this admission: Zosyn 10/19>10/20 CRO 10/20 >>10/22 Azithro 10/22 >> 10/24 Fluconazole  10/19 >> 10/25 Micafungin  10/25 >> 10/11 Fluconazole  11/11 >> [12/18] Linezolid  11/13 >> 11/17 Cefepime  11/13 >> 11/17 Vanc 12/5>> Merrem  12/6>>  Dose adjustments this admission:   Microbiology results: 10/18 Bcx: 1/2 Candida albicans  10/20 BCx: budding yeasts  10/22 Bcx: Neg 10/24 Bcx: drawn at [facility] 10/24 Bcx:  neg 10/25 Bcx: neg 10/18 UCx: budding yeasts   Sergio Batch, PharmD, BCIDP, AAHIVP, CPP Infectious Disease Pharmacist 02/09/2024 5:26 PM

## 2024-02-09 NOTE — Progress Notes (Signed)
 [address] [employer] [address]             [phone]                1 Day Post-Op Procedure(s) (LRB): REDO STERNOTOMY (N/A) REPLACEMENT, MITRAL VALVE WITH MITRIS RESILIA MITRAL VALVE 27mm (N/A) ECHOCARDIOGRAM, TRANSESOPHAGEAL, INTRAOPERATIVE (N/A)   Events: Increased chemical support overnight Not much improvement on hemodynamics _______________________________________________________________ Vitals: BP (!) 125/56   Pulse 100   Temp (!) 100.4 F (38 C)   Resp 20   Ht 5' 4 (1.626 m)   Wt 76.7 kg   SpO2 98%   BMI 29.02 kg/m  Filed Weights   02/07/24 0429 02/08/24 0524 02/09/24 0500  Weight: 63.2 kg 64.3 kg 76.7 kg     - Neuro: sedated  - Cardiovascular: paced 100.  Afib under  Drips: epi 10, levo 34, vaso 0.04, milr 0.375, dob 5.    iNO: 30ppm PAP: (20-39)/(8-25) 23/17 CVP:  [11 mmHg-35 mmHg] 19 mmHg PCWP:  [10 mmHg-16 mmHg] 16 mmHg CO:  [2.1 L/min-3.1 L/min] 2.2 L/min CI:  [1.2 L/min/m2-1.8 L/min/m2] 1.3 L/min/m2  - Pulm:  Vent Mode: SIMV;PSV;PRVC FiO2 (%):  [50 %-100 %] 100 % Set Rate:  [16 bmp-20 bmp] 20 bmp Vt Set:  [440 mL] 440 mL PEEP:  [5 cmH20-8 cmH20] 5 cmH20 Pressure Support:  [10 cmH20] 10 cmH20 Plateau Pressure:  [19 cmH20-21 cmH20] 21 cmH20  ABG    Component Value Date/Time   PHART 7.389 02/09/2024 0749   PCO2ART 37.0 02/09/2024 0749   PO2ART 70 (L) 02/09/2024 0749   HCO3 22.1 02/09/2024 0749   TCO2 23 02/09/2024 0749   ACIDBASEDEF 2.0 02/09/2024 0749   O2SAT 93 02/09/2024 0749    - Abd: ND - Extremity: edematous  .Intake/Output      12/05 0701 12/06 0700 12/06 0701 12/07 0700   P.O.     I.V. (mL/kg) 4232.1 (55.2) 208.7 (2.7)   Blood 1604    NG/GT 140    IV Piggyback 3421.6    Total Intake(mL/kg) 9397.6 (122.5) 208.7 (2.7)   Urine (mL/kg/hr) 574 (0.3) 10 (0.1)   Emesis/NG output 255    Blood 1100    Chest Tube 470 0   Total Output 2399 10   Net +6998.6 +198.7            _______________________________________________________________ Labs:    Latest Ref Rng & Units 02/09/2024    7:49 AM 02/09/2024    3:15 AM 02/09/2024    2:03 AM  CBC  WBC 4.0 - 10.5 K/uL   13.1   Hemoglobin 12.0 - 15.0 g/dL 9.5  9.9  89.6   Hematocrit 36.0 - 46.0 % 28.0  29.0  31.0   Platelets 150 - 400 K/uL   145       Latest Ref Rng & Units 02/09/2024    7:49 AM 02/09/2024    3:15 AM 02/09/2024    2:03 AM  CMP  Glucose 70 - 99 mg/dL   851   BUN 8 - 23 mg/dL   22   Creatinine 9.55 - 1.00 mg/dL   8.21   Sodium 864 - 854 mmol/L 144  142  140   Potassium 3.5 - 5.1 mmol/L 4.1  4.0  4.1   Chloride 98 - 111 mmol/L   106   CO2 22 - 32 mmol/L   20   Calcium  8.9 - 10.3 mg/dL  9.2   Total Protein 6.5 - 8.1 g/dL   5.1   Total Bilirubin 0.0 - 1.2 mg/dL   1.6   Alkaline Phos 38 - 126 U/L   81   AST 15 - 41 U/L   105   ALT 0 - 44 U/L   15     CXR: Small effusion  _______________________________________________________________  Assessment and Plan: POD 1 s/p redo sternotomy, MVR  Neuro: maintain sedation CV: on increasing chemical support without much improvement.  Will plan for Protect Duo today. Pulm: maintain vent support Renal: creat up slightly.  Not much urine output GI: NPO Heme: stable.  Will potentially start eliquis  tomorrow ID: [email or web address] Endo: SSI Dispo: continue ICU care   Tracy Baker 02/09/2024 8:48 AM

## 2024-02-09 NOTE — Progress Notes (Signed)
 Rt x 2 assisted with patient transport on vent and nitric oxide from cath lab to 2H03 without complications.

## 2024-02-09 NOTE — Progress Notes (Signed)
 Flows stable at 2.5L after advanced cannula previously. While patient was receiving chest XR the flows started to intermittently drop again. The cannula dressing was removed, cleaned with CHG swabs, and anchoring sutures removed. The cannula was advanced 4cm with improvement in flow pattern. Flows were walked up to 3L with no further intermittent drops. The cannula was cleaned, dressed, and sutured into place.   Additional CCT: 30 minutes

## 2024-02-10 ENCOUNTER — Inpatient Hospital Stay (HOSPITAL_COMMUNITY)

## 2024-02-10 DIAGNOSIS — J9601 Acute respiratory failure with hypoxia: Secondary | ICD-10-CM

## 2024-02-10 LAB — COMPREHENSIVE METABOLIC PANEL WITH GFR
ALT: 5 U/L (ref 0–44)
AST: 56 U/L — ABNORMAL HIGH (ref 15–41)
Albumin: 3 g/dL — ABNORMAL LOW (ref 3.5–5.0)
Alkaline Phosphatase: 83 U/L (ref 38–126)
Anion gap: 12 (ref 5–15)
BUN: 29 mg/dL — ABNORMAL HIGH (ref 8–23)
CO2: 23 mmol/L (ref 22–32)
Calcium: 7.4 mg/dL — ABNORMAL LOW (ref 8.9–10.3)
Chloride: 104 mmol/L (ref 98–111)
Creatinine, Ser: 2.26 mg/dL — ABNORMAL HIGH (ref 0.44–1.00)
GFR, Estimated: 23 mL/min — ABNORMAL LOW (ref >60)
Glucose, Bld: 107 mg/dL — ABNORMAL HIGH (ref 70–99)
Potassium: 4.2 mmol/L (ref 3.5–5.1)
Sodium: 139 mmol/L (ref 135–145)
Total Bilirubin: 1.1 mg/dL (ref 0.0–1.2)
Total Protein: 4.7 g/dL — ABNORMAL LOW (ref 6.5–8.1)

## 2024-02-10 LAB — HEPATIC FUNCTION PANEL
ALT: 6 U/L (ref 0–44)
AST: 76 U/L — ABNORMAL HIGH (ref 15–41)
Albumin: 3.2 g/dL — ABNORMAL LOW (ref 3.5–5.0)
Alkaline Phosphatase: 67 U/L (ref 38–126)
Bilirubin, Direct: 0.5 mg/dL — ABNORMAL HIGH (ref 0.0–0.2)
Indirect Bilirubin: 0.7 mg/dL (ref 0.3–0.9)
Total Bilirubin: 1.2 mg/dL (ref 0.0–1.2)
Total Protein: 4.4 g/dL — ABNORMAL LOW (ref 6.5–8.1)

## 2024-02-10 LAB — POCT I-STAT 7, (LYTES, BLD GAS, ICA,H+H)
Acid-Base Excess: 0 mmol/L (ref 0.0–2.0)
Acid-Base Excess: 0 mmol/L (ref 0.0–2.0)
Acid-base deficit: 3 mmol/L — ABNORMAL HIGH (ref 0.0–2.0)
Acid-base deficit: 1 mmol/L (ref 0.0–2.0)
Acid-base deficit: 1 mmol/L (ref 0.0–2.0)
Acid-base deficit: 1 mmol/L (ref 0.0–2.0)
Acid-base deficit: 3 mmol/L — ABNORMAL HIGH (ref 0.0–2.0)
Acid-base deficit: 3 mmol/L — ABNORMAL HIGH (ref 0.0–2.0)
Acid-base deficit: 2 mmol/L (ref 0.0–2.0)
Bicarbonate: 20.6 mmol/L (ref 20.0–28.0)
Bicarbonate: 22.6 mmol/L (ref 20.0–28.0)
Bicarbonate: 22.6 mmol/L (ref 20.0–28.0)
Bicarbonate: 22.8 mmol/L (ref 20.0–28.0)
Bicarbonate: 23.1 mmol/L (ref 20.0–28.0)
Bicarbonate: 23.4 mmol/L (ref 20.0–28.0)
Bicarbonate: 23.5 mmol/L (ref 20.0–28.0)
Bicarbonate: 24 mmol/L (ref 20.0–28.0)
Bicarbonate: 24.6 mmol/L (ref 20.0–28.0)
Calcium, Ion: 1.01 mmol/L — ABNORMAL LOW (ref 1.15–1.40)
Calcium, Ion: 1.02 mmol/L — ABNORMAL LOW (ref 1.15–1.40)
Calcium, Ion: 1.02 mmol/L — ABNORMAL LOW (ref 1.15–1.40)
Calcium, Ion: 1.03 mmol/L — ABNORMAL LOW (ref 1.15–1.40)
Calcium, Ion: 1.03 mmol/L — ABNORMAL LOW (ref 1.15–1.40)
Calcium, Ion: 1.06 mmol/L — ABNORMAL LOW (ref 1.15–1.40)
Calcium, Ion: 1.08 mmol/L — ABNORMAL LOW (ref 1.15–1.40)
Calcium, Ion: 1.08 mmol/L — ABNORMAL LOW (ref 1.15–1.40)
Calcium, Ion: 1.08 mmol/L — ABNORMAL LOW (ref 1.15–1.40)
HCT: 18 % — ABNORMAL LOW (ref 36.0–46.0)
HCT: 28 % — ABNORMAL LOW (ref 36.0–46.0)
HCT: 28 % — ABNORMAL LOW (ref 36.0–46.0)
HCT: 28 % — ABNORMAL LOW (ref 36.0–46.0)
HCT: 28 % — ABNORMAL LOW (ref 36.0–46.0)
HCT: 29 % — ABNORMAL LOW (ref 36.0–46.0)
HCT: 29 % — ABNORMAL LOW (ref 36.0–46.0)
HCT: 29 % — ABNORMAL LOW (ref 36.0–46.0)
HCT: 30 % — ABNORMAL LOW (ref 36.0–46.0)
Hemoglobin: 10.2 g/dL — ABNORMAL LOW (ref 12.0–15.0)
Hemoglobin: 6.1 g/dL — CL (ref 12.0–15.0)
Hemoglobin: 9.5 g/dL — ABNORMAL LOW (ref 12.0–15.0)
Hemoglobin: 9.5 g/dL — ABNORMAL LOW (ref 12.0–15.0)
Hemoglobin: 9.5 g/dL — ABNORMAL LOW (ref 12.0–15.0)
Hemoglobin: 9.5 g/dL — ABNORMAL LOW (ref 12.0–15.0)
Hemoglobin: 9.9 g/dL — ABNORMAL LOW (ref 12.0–15.0)
Hemoglobin: 9.9 g/dL — ABNORMAL LOW (ref 12.0–15.0)
Hemoglobin: 9.9 g/dL — ABNORMAL LOW (ref 12.0–15.0)
O2 Saturation: 100 %
O2 Saturation: 94 %
O2 Saturation: 95 %
O2 Saturation: 97 %
O2 Saturation: 98 %
O2 Saturation: 98 %
O2 Saturation: 98 %
O2 Saturation: 99 %
O2 Saturation: 99 %
Patient temperature: 36.4
Patient temperature: 36.4
Patient temperature: 36.5
Patient temperature: 36.5
Patient temperature: 36.5
Patient temperature: 36.8
Patient temperature: 36.8
Patient temperature: 36.8
Potassium: 3.8 mmol/L (ref 3.5–5.1)
Potassium: 3.8 mmol/L (ref 3.5–5.1)
Potassium: 3.9 mmol/L (ref 3.5–5.1)
Potassium: 4.2 mmol/L (ref 3.5–5.1)
Potassium: 4.2 mmol/L (ref 3.5–5.1)
Potassium: 4.3 mmol/L (ref 3.5–5.1)
Potassium: 4.3 mmol/L (ref 3.5–5.1)
Potassium: 4.3 mmol/L (ref 3.5–5.1)
Potassium: 4.7 mmol/L (ref 3.5–5.1)
Sodium: 140 mmol/L (ref 135–145)
Sodium: 140 mmol/L (ref 135–145)
Sodium: 140 mmol/L (ref 135–145)
Sodium: 140 mmol/L (ref 135–145)
Sodium: 140 mmol/L (ref 135–145)
Sodium: 141 mmol/L (ref 135–145)
Sodium: 141 mmol/L (ref 135–145)
Sodium: 141 mmol/L (ref 135–145)
Sodium: 143 mmol/L (ref 135–145)
TCO2: 21 mmol/L — ABNORMAL LOW (ref 22–32)
TCO2: 24 mmol/L (ref 22–32)
TCO2: 24 mmol/L (ref 22–32)
TCO2: 24 mmol/L (ref 22–32)
TCO2: 24 mmol/L (ref 22–32)
TCO2: 25 mmol/L (ref 22–32)
TCO2: 25 mmol/L (ref 22–32)
TCO2: 25 mmol/L (ref 22–32)
TCO2: 26 mmol/L (ref 22–32)
pCO2 arterial: 27.7 mmHg — ABNORMAL LOW (ref 32–48)
pCO2 arterial: 33 mmHg (ref 32–48)
pCO2 arterial: 36.2 mmHg (ref 32–48)
pCO2 arterial: 37.3 mmHg (ref 32–48)
pCO2 arterial: 37.3 mmHg (ref 32–48)
pCO2 arterial: 38.2 mmHg (ref 32–48)
pCO2 arterial: 38.4 mmHg (ref 32–48)
pCO2 arterial: 39.5 mmHg (ref 32–48)
pCO2 arterial: 43.4 mmHg (ref 32–48)
pH, Arterial: 7.328 — ABNORMAL LOW (ref 7.35–7.45)
pH, Arterial: 7.364 (ref 7.35–7.45)
pH, Arterial: 7.377 (ref 7.35–7.45)
pH, Arterial: 7.403 (ref 7.35–7.45)
pH, Arterial: 7.404 (ref 7.35–7.45)
pH, Arterial: 7.41 (ref 7.35–7.45)
pH, Arterial: 7.425 (ref 7.35–7.45)
pH, Arterial: 7.458 — ABNORMAL HIGH (ref 7.35–7.45)
pH, Arterial: 7.481 — ABNORMAL HIGH (ref 7.35–7.45)
pO2, Arterial: 103 mmHg (ref 83–108)
pO2, Arterial: 110 mmHg — ABNORMAL HIGH (ref 83–108)
pO2, Arterial: 110 mmHg — ABNORMAL HIGH (ref 83–108)
pO2, Arterial: 145 mmHg — ABNORMAL HIGH (ref 83–108)
pO2, Arterial: 272 mmHg — ABNORMAL HIGH (ref 83–108)
pO2, Arterial: 74 mmHg — ABNORMAL LOW (ref 83–108)
pO2, Arterial: 74 mmHg — ABNORMAL LOW (ref 83–108)
pO2, Arterial: 93 mmHg (ref 83–108)
pO2, Arterial: 99 mmHg (ref 83–108)

## 2024-02-10 LAB — DIC (DISSEMINATED INTRAVASCULAR COAGULATION)PANEL
D-Dimer, Quant: 2.21 ug{FEU}/mL — ABNORMAL HIGH (ref 0.00–0.50)
Fibrinogen: 278 mg/dL (ref 210–475)
INR: 5.3 (ref 0.8–1.2)
Platelets: 76 K/uL — ABNORMAL LOW (ref 150–400)
Prothrombin Time: 51 s — ABNORMAL HIGH (ref 11.4–15.2)
Smear Review: NONE SEEN
aPTT: 38 s — ABNORMAL HIGH (ref 24–36)

## 2024-02-10 LAB — BASIC METABOLIC PANEL WITH GFR
Anion gap: 15 (ref 5–15)
Anion gap: 10 (ref 5–15)
BUN: 24 mg/dL — ABNORMAL HIGH (ref 8–23)
BUN: 27 mg/dL — ABNORMAL HIGH (ref 8–23)
CO2: 23 mmol/L (ref 22–32)
CO2: 24 mmol/L (ref 22–32)
Calcium: 8 mg/dL — ABNORMAL LOW (ref 8.9–10.3)
Calcium: 7.3 mg/dL — ABNORMAL LOW (ref 8.9–10.3)
Chloride: 104 mmol/L (ref 98–111)
Chloride: 105 mmol/L (ref 98–111)
Creatinine, Ser: 2.08 mg/dL — ABNORMAL HIGH (ref 0.44–1.00)
Creatinine, Ser: 2.16 mg/dL — ABNORMAL HIGH (ref 0.44–1.00)
GFR, Estimated: 25 mL/min — ABNORMAL LOW (ref >60)
GFR, Estimated: 24 mL/min — ABNORMAL LOW (ref >60)
Glucose, Bld: 122 mg/dL — ABNORMAL HIGH (ref 70–99)
Glucose, Bld: 109 mg/dL — ABNORMAL HIGH (ref 70–99)
Potassium: 3.9 mmol/L (ref 3.5–5.1)
Potassium: 4.5 mmol/L (ref 3.5–5.1)
Sodium: 142 mmol/L (ref 135–145)
Sodium: 139 mmol/L (ref 135–145)

## 2024-02-10 LAB — CBC
HCT: 29.2 % — ABNORMAL LOW (ref 36.0–46.0)
HCT: 30.2 % — ABNORMAL LOW (ref 36.0–46.0)
HCT: 30.6 % — ABNORMAL LOW (ref 36.0–46.0)
Hemoglobin: 10 g/dL — ABNORMAL LOW (ref 12.0–15.0)
Hemoglobin: 10.7 g/dL — ABNORMAL LOW (ref 12.0–15.0)
Hemoglobin: 9.9 g/dL — ABNORMAL LOW (ref 12.0–15.0)
MCH: 30.2 pg (ref 26.0–34.0)
MCH: 30.5 pg (ref 26.0–34.0)
MCH: 31.2 pg (ref 26.0–34.0)
MCHC: 32.8 g/dL (ref 30.0–36.0)
MCHC: 34.2 g/dL (ref 30.0–36.0)
MCHC: 35 g/dL (ref 30.0–36.0)
MCV: 89 fL (ref 80.0–100.0)
MCV: 89.2 fL (ref 80.0–100.0)
MCV: 92.1 fL (ref 80.0–100.0)
Platelets: 63 K/uL — ABNORMAL LOW (ref 150–400)
Platelets: 77 K/uL — ABNORMAL LOW (ref 150–400)
Platelets: 84 K/uL — ABNORMAL LOW (ref 150–400)
RBC: 3.28 MIL/uL — ABNORMAL LOW (ref 3.87–5.11)
RBC: 3.28 MIL/uL — ABNORMAL LOW (ref 3.87–5.11)
RBC: 3.43 MIL/uL — ABNORMAL LOW (ref 3.87–5.11)
RDW: 20.3 % — ABNORMAL HIGH (ref 11.5–15.5)
RDW: 20.4 % — ABNORMAL HIGH (ref 11.5–15.5)
RDW: 20.8 % — ABNORMAL HIGH (ref 11.5–15.5)
WBC: 5 K/uL (ref 4.0–10.5)
WBC: 7.2 K/uL (ref 4.0–10.5)
WBC: 7.3 K/uL (ref 4.0–10.5)
nRBC: 0.4 % — ABNORMAL HIGH (ref 0.0–0.2)
nRBC: 0.5 % — ABNORMAL HIGH (ref 0.0–0.2)
nRBC: 0.6 % — ABNORMAL HIGH (ref 0.0–0.2)

## 2024-02-10 LAB — GLUCOSE, CAPILLARY
Glucose-Capillary: 107 mg/dL — ABNORMAL HIGH (ref 70–99)
Glucose-Capillary: 116 mg/dL — ABNORMAL HIGH (ref 70–99)
Glucose-Capillary: 115 mg/dL — ABNORMAL HIGH (ref 70–99)
Glucose-Capillary: 141 mg/dL — ABNORMAL HIGH (ref 70–99)
Glucose-Capillary: 124 mg/dL — ABNORMAL HIGH (ref 70–99)
Glucose-Capillary: 125 mg/dL — ABNORMAL HIGH (ref 70–99)
Glucose-Capillary: 114 mg/dL — ABNORMAL HIGH (ref 70–99)
Glucose-Capillary: 87 mg/dL (ref 70–99)
Glucose-Capillary: 120 mg/dL — ABNORMAL HIGH (ref 70–99)
Glucose-Capillary: 117 mg/dL — ABNORMAL HIGH (ref 70–99)
Glucose-Capillary: 113 mg/dL — ABNORMAL HIGH (ref 70–99)
Glucose-Capillary: 90 mg/dL (ref 70–99)
Glucose-Capillary: 98 mg/dL (ref 70–99)
Glucose-Capillary: 90 mg/dL (ref 70–99)
Glucose-Capillary: 92 mg/dL (ref 70–99)

## 2024-02-10 LAB — GLOBAL TEG PANEL
CFF Max Amplitude: 16.2 mm (ref 15–32)
CK with Heparinase (R): 11.9 min — ABNORMAL HIGH (ref 4.3–8.3)
Citrated Functional Fibrinogen: 295.6 mg/dL (ref 278–581)
Citrated Kaolin (K): 2 min (ref 0.8–2.1)
Citrated Kaolin (MA): 56.2 mm (ref 52–69)
Citrated Kaolin (R): 9 min (ref 4.6–9.1)
Citrated Kaolin Angle: 69 deg (ref 63–78)
Citrated Rapid TEG (MA): 55.3 mm (ref 52–70)

## 2024-02-10 LAB — PREPARE PLATELET PHERESIS
Unit division: 0
Unit division: 0

## 2024-02-10 LAB — BPAM PLATELET PHERESIS
Blood Product Expiration Date: 202512082359
Blood Product Expiration Date: 202512082359
ISSUE DATE / TIME: 202512061410
ISSUE DATE / TIME: 202512061410
Unit Type and Rh: 5100
Unit Type and Rh: 6200

## 2024-02-10 LAB — RENAL FUNCTION PANEL
Albumin: 3.1 g/dL — ABNORMAL LOW (ref 3.5–5.0)
Anion gap: 12 (ref 5–15)
BUN: 26 mg/dL — ABNORMAL HIGH (ref 8–23)
CO2: 24 mmol/L (ref 22–32)
Calcium: 7.7 mg/dL — ABNORMAL LOW (ref 8.9–10.3)
Chloride: 105 mmol/L (ref 98–111)
Creatinine, Ser: 2.03 mg/dL — ABNORMAL HIGH (ref 0.44–1.00)
GFR, Estimated: 26 mL/min — ABNORMAL LOW (ref >60)
Glucose, Bld: 121 mg/dL — ABNORMAL HIGH (ref 70–99)
Phosphorus: 5.9 mg/dL — ABNORMAL HIGH (ref 2.5–4.6)
Potassium: 3.9 mmol/L (ref 3.5–5.1)
Sodium: 141 mmol/L (ref 135–145)

## 2024-02-10 LAB — MAGNESIUM
Magnesium: 2.5 mg/dL — ABNORMAL HIGH (ref 1.7–2.4)
Magnesium: 2.3 mg/dL (ref 1.7–2.4)

## 2024-02-10 LAB — COOXEMETRY PANEL
Carboxyhemoglobin: 1.6 % — ABNORMAL HIGH (ref 0.5–1.5)
Methemoglobin: 1.2 % (ref 0.0–1.5)
O2 Saturation: 63.9 %
Total hemoglobin: 9.9 g/dL — ABNORMAL LOW (ref 12.0–16.0)

## 2024-02-10 LAB — CG4 I-STAT (LACTIC ACID)
Lactic Acid, Venous: 2.8 mmol/L (ref 0.5–1.9)
Lactic Acid, Venous: 1 mmol/L (ref 0.5–1.9)
Lactic Acid, Venous: 1 mmol/L (ref 0.5–1.9)
Lactic Acid, Venous: 1.1 mmol/L (ref 0.5–1.9)

## 2024-02-10 LAB — PROTIME-INR
INR: 4.6 (ref 0.8–1.2)
INR: 3.1 — ABNORMAL HIGH (ref 0.8–1.2)
Prothrombin Time: 45.6 s — ABNORMAL HIGH (ref 11.4–15.2)
Prothrombin Time: 33 s — ABNORMAL HIGH (ref 11.4–15.2)

## 2024-02-10 LAB — FIBRINOGEN
Fibrinogen: 253 mg/dL (ref 210–475)
Fibrinogen: 315 mg/dL (ref 210–475)

## 2024-02-10 LAB — SODIUM, URINE, RANDOM: Sodium, Ur: 92 mmol/L

## 2024-02-10 LAB — VANCOMYCIN, RANDOM: Vancomycin Rm: 19 ug/mL

## 2024-02-10 LAB — PHOSPHORUS: Phosphorus: 5.9 mg/dL — ABNORMAL HIGH (ref 2.5–4.6)

## 2024-02-10 LAB — LACTATE DEHYDROGENASE: LDH: 551 U/L — ABNORMAL HIGH (ref 105–235)

## 2024-02-10 LAB — APTT: aPTT: 37 s — ABNORMAL HIGH (ref 24–36)

## 2024-02-10 LAB — POCT ACTIVATED CLOTTING TIME: Activated Clotting Time: 255 s

## 2024-02-10 MED ORDER — DOBUTAMINE-DEXTROSE 4-5 MG/ML-% IV SOLN
1.0000 ug/kg/min | INTRAVENOUS | Status: DC
  Administered 2024-02-10 – 2024-02-18 (×5): 5 ug/kg/min via INTRAVENOUS
  Administered 2024-02-22: 3 ug/kg/min via INTRAVENOUS
  Administered 2024-02-29: 2 ug/kg/min via INTRAVENOUS
  Filled 2024-02-10 (×7): qty 250

## 2024-02-10 MED ORDER — SODIUM CHLORIDE 0.9% IV SOLUTION: Freq: Once | INTRAVENOUS | Status: DC

## 2024-02-10 MED ORDER — ROCURONIUM BROMIDE 10 MG/ML (PF) SYRINGE
PREFILLED_SYRINGE | INTRAVENOUS | Status: AC
  Administered 2024-02-10: 100 mg via INTRAVENOUS
  Filled 2024-02-10: qty 10

## 2024-02-10 MED ORDER — CLONAZEPAM 0.5 MG PO TABS
0.5000 mg | ORAL_TABLET | Freq: Two times a day (BID) | ORAL | Status: DC
  Administered 2024-02-10 (×2): 0.5 mg
  Filled 2024-02-10 (×2): qty 1

## 2024-02-10 MED ORDER — POTASSIUM CHLORIDE 10 MEQ/50ML IV SOLN
10.0000 meq | INTRAVENOUS | Status: AC
  Administered 2024-02-10 (×4): 10 meq via INTRAVENOUS
  Filled 2024-02-10: qty 50

## 2024-02-10 MED ORDER — ACETAZOLAMIDE SODIUM 500 MG IJ SOLR
500.0000 mg | Freq: Once | INTRAMUSCULAR | Status: AC
  Administered 2024-02-10: 500 mg via INTRAVENOUS
  Filled 2024-02-10: qty 500

## 2024-02-10 MED ORDER — QUETIAPINE FUMARATE 25 MG PO TABS
25.0000 mg | ORAL_TABLET | Freq: Two times a day (BID) | ORAL | Status: DC
  Administered 2024-02-10 (×2): 25 mg
  Filled 2024-02-10 (×2): qty 1

## 2024-02-10 MED ORDER — OXYCODONE HCL 5 MG PO TABS
2.5000 mg | ORAL_TABLET | Freq: Four times a day (QID) | ORAL | Status: DC
  Administered 2024-02-10 – 2024-02-11 (×4): 2.5 mg
  Filled 2024-02-10 (×4): qty 1

## 2024-02-10 MED ORDER — ALBUMIN HUMAN 5 % IV SOLN
12.5000 g | INTRAVENOUS | Status: DC | PRN
  Administered 2024-02-10: 12.5 g via INTRAVENOUS
  Filled 2024-02-10: qty 250

## 2024-02-10 MED ORDER — ETOMIDATE 2 MG/ML IV SOLN: 10.0000 mg | Freq: Once | INTRAVENOUS | Status: AC

## 2024-02-10 MED ORDER — DEXTROSE 5 % IV SOLN
500.0000 mg | Freq: Once | INTRAVENOUS | Status: AC
  Administered 2024-02-10: 500 mg via INTRAVENOUS
  Filled 2024-02-10: qty 17.8

## 2024-02-10 MED ORDER — ROCURONIUM BROMIDE 10 MG/ML (PF) SYRINGE: 100.0000 mg | PREFILLED_SYRINGE | Freq: Once | INTRAVENOUS | Status: AC

## 2024-02-10 MED ORDER — VITAMIN K1 10 MG/ML IJ SOLN
5.0000 mg | Freq: Once | INTRAVENOUS | Status: AC
  Administered 2024-02-10: 5 mg via INTRAVENOUS
  Filled 2024-02-10: qty 0.5

## 2024-02-10 MED ORDER — STERILE WATER FOR INJECTION IJ SOLN
INTRAMUSCULAR | Status: AC
  Administered 2024-02-10: 5 mL
  Filled 2024-02-10: qty 10

## 2024-02-10 MED ORDER — ALBUMIN HUMAN 5 % IV SOLN
12.5000 g | Freq: Once | INTRAVENOUS | Status: AC
  Administered 2024-02-10: 12.5 g via INTRAVENOUS

## 2024-02-10 MED ORDER — ETOMIDATE 2 MG/ML IV SOLN
INTRAVENOUS | Status: AC
  Administered 2024-02-10: 10 mg via INTRAVENOUS
  Filled 2024-02-10: qty 10

## 2024-02-10 NOTE — Progress Notes (Signed)
 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 Kerrin. PCCM consulted for ICU post-op vent management.  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 Kerrin 12/6: Overnight patient started requiring more vasopressor support, early morning she was on dobutamine  5, milrinone  0.375, epinephrine  at 10, vasopressin  0.04 and norepinephrine  34 mics, she is not making urine.  Cannulated with Protek duo for RV support,   Interim History / Subjective:  Patient remained afebrile Remain on V-p ECMO with flow around 2 L, had episodes of choking overnight requiring 2 bottles of albumin  On Lasix  infusion at 20 mg/h, not making much urine, 1273 cc in last 24 hours Remain on inhaled nitric oxide at 20 ppm  Objective    Blood pressure (!) 94/58, pulse 80, temperature 97.7 F (36.5 C), resp. rate 15, height 5' 4 (1.626 m), weight 78 kg, SpO2 98%. PAP: (21-74)/(11-34) 35/25 CVP:  [4 mmHg-42 mmHg] 15 mmHg CO:  [2.1 L/min-4.8 L/min] 4.8 L/min CI:  [1.3 L/min/m2-2.9 L/min/m2] 2.9 L/min/m2  Vent Mode: SIMV/PC/PS FiO2 (%):  [40 %-100 %] 40 % Set Rate:  [16 bmp-20 bmp] 16 bmp Vt Set:  [440 mL] 440 mL PEEP:  [5 cmH20] 5 cmH20 Pressure Support:  [10 cmH20] 10 cmH20 Plateau Pressure:  [20  cmH20-24 cmH20] 24 cmH20   Intake/Output Summary (Last 24 hours) at 02/10/2024 0743 Last data filed at 02/10/2024 0700 Gross per 24 hour  Intake 3965.74 ml  Output 1773 ml  Net 2192.74 ml   Filed Weights   02/08/24 0524 02/09/24 0500 02/10/24 0500  Weight: 64.3 kg 76.7 kg 78 kg     Physical exam: General: Crtitically ill-appearing elderly female, orally intubated HEENT: /AT, eyes anicteric.  ETT and OGT in place.  Protek duo cannula noted on right IJ Neuro: Sedated, not following commands.  Eyes are closed.  Pupils 3 mm bilateral reactive to light Chest: Bilateral fine crackles, no wheezes or rhonchi Heart: Paced rhythm, with underlying complete heart block, no murmurs or gallops Abdomen: Soft, nondistended, bowel sounds present  Labs and images reviewed X-ray chest is showing asymmetric edema on the right side  Patient Lines/Drains/Airways Status     Active Line/Drains/Airways     Name Placement date Placement time Site Days   Arterial Line 02/08/24 Right Radial 02/08/24  0700  Radial  2   PICC Double Lumen 01/15/24 Right Basilic 35 cm 0 cm 01/15/24  8384  -- 26   ECMO Dual Lumen Cannula 02/09/24  1135  -- 1   NG/OG Vented/Dual Lumen 16 Fr. Oral 02/08/24  1333  Oral  2   Urethral Catheter [name], RN Latex;Straight-tip;Temperature probe 14 Fr. 02/08/24  0800  Latex;Straight-tip;Temperature probe  2   Y Chest Tube 1, 2, and 3 1 Right Pleural 28 Fr. 2 Left Pleural 28 Fr. 3 Anterior  Mediastinal 36 Fr. 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, with persistent severe mitral regurgitation and status post redo sternotomy and bioprosthetic MVR Acute RV failure with cardiogenic shock status post Protek duo ECMO cannula on 12/6 Acute respiratory failure with hypoxia AKI on CKD stage IIIa, worsening Acute blood loss anemia status post multiple PRBC transfusion Thrombocytopenia due to critical illness status post 2 unit platelets Coagulopathy, probably from liver congestion requiring FFP transfusion Prediabetes patient's hemoglobin A1c was 6 Severe protein calorie malnutrition  Continue Diflucan  Continue aspirin  Monitor chest tube output, output was 300 cc in last 24 hours On Vp ECMO for RV support, flowing 2-2.1 L with sweep 0.5 and 60% FiO2 on blender ECMO flow was decreased to 1.5, she tolerated well which suggest her RV started getting better Lactate has cleared to 1 Trend ABGs Vasopressor requirement improved, currently on 6 mics of Levophed  and vasopressin  and 0.25 off milrinone  Continue broad-spectrum antibiotics X-ray chest is suggestive of asymmetric edema on right side Will do bronchoscopy to rule out pneumonia and send BAL Continue lung protective ventilation VAP prevention bundle in place PAD protocol with fentanyl  and Versed  Serum creatinine trending up, currently at 2 Not making much urine could be due to intravascularly she is dry Monitor H&H and transfuse if less than 8 Her platelet counts are low, watch for signs of bleeding She is oozing from Protek duo cannula site Her INR is 5.9 Will transfuse 3 units FFP's DIC panel is negative TEG is pending Continue insulin  infusion for now with CBG goal 140-180 Start trickle tube feeds and dietary supplements  Labs:  CBC: Recent Labs  Lab 02/09/24 0203 02/09/24 0315 02/09/24 1310 02/09/24 1313 02/09/24 1721 02/09/24 1724 02/09/24 2356 02/09/24 2358 02/10/24 0511 02/10/24 0513 02/10/24 0700  WBC 13.1*  --  6.7  --  6.9   --  7.3  --  7.2  --   --   HGB 10.3*   < > 10.2*   < > 9.3* 9.9* 10.7* 10.2* 10.0* 9.5*  --   HCT 31.0*   < > 29.4*   < > 27.0* 29.0* 30.6* 30.0* 29.2* 28.0*  --   MCV 95.4  --  89.4  --  91.8  --  89.2  --  89.0  --   --   PLT 145*  --  39*  --  99*  --  84*  --  77*  --  76*   < > = values in this interval not displayed.    Basic Metabolic Panel: Recent Labs  Lab 02/08/24 1944 02/08/24 1946 02/09/24 0203 02/09/24 0315 02/09/24 1310 02/09/24 1313 02/09/24 1721 02/09/24 1724 02/09/24 2358 02/10/24 0511 02/10/24 0513  NA 142   < > 140   < > 140   < > 140 141 141 142 141  K 4.1   < > 4.1   < > 4.3   < >  4.2 4.2 3.8 3.9 3.8  CL 105  --  106  --  106  --  104  --   --  104  --   CO2 19*  --  20*  --  18*  --  23  --   --  23  --   GLUCOSE 212*  --  148*  --  97  --  119*  --   --  122*  --   BUN 19  --  22  --  22  --  23  --   --  24*  --   CREATININE 1.41*  --  1.78*  --  1.70*  --  1.75*  --   --  2.08*  --   CALCIUM  9.3  --  9.2  --  7.5*  --  7.7*  --   --  8.0*  --   MG 3.3*  --  2.8*  --  2.3  --  2.5*  --   --  2.5*  --   PHOS  --   --   --   --   --   --   --   --   --  5.9*  --    < > = values in this interval not displayed.   GFR: Estimated Creatinine Clearance: 26.2 mL/min (A) (by C-G formula based on SCr of 2.08 mg/dL (H)). Recent Labs  Lab 02/09/24 1148 02/09/24 1310 02/09/24 1312 02/09/24 1721 02/09/24 1723 02/09/24 2356 02/10/24 0511 02/10/24 0518  WBC  --  6.7  --  6.9  --  7.3 7.2  --   LATICACIDVEN 2.8*  --  3.0*  --  1.5  --   --  1.0    Liver Function Tests: Recent Labs  Lab 02/09/24 0203 02/09/24 1310 02/10/24 0511  AST 105* 99* 76*  ALT 15 12 6   ALKPHOS 81 56 67  BILITOT 1.6* 0.9 1.2  PROT 5.1* 3.6* 4.4*  ALBUMIN  3.4* 2.5* 3.2*   No results for input(s): LIPASE, AMYLASE in the last 168 hours. No results for input(s): AMMONIA in the last 168 hours.  ABG    Component Value Date/Time   PHART 7.403 02/10/2024 0513   PCO2ART  38.2 02/10/2024 0513   PO2ART 103 02/10/2024 0513   HCO3 24.0 02/10/2024 0513   TCO2 25 02/10/2024 0513   ACIDBASEDEF 1.0 02/10/2024 0513   O2SAT 98 02/10/2024 0513    Coagulation Profile: Recent Labs  Lab 02/08/24 1400 02/09/24 1310 02/10/24 0511 02/10/24 0700  INR 2.5* 3.9* 4.6* 5.3*    Cardiac Enzymes: No results for input(s): CKTOTAL, CKMB, CKMBINDEX, TROPONINI in the last 168 hours.  HbA1C: Hgb A1c MFr Bld  Date/Time Value Ref Range Status  01/11/2024 12:00 PM 5.4 4.8 - 5.6 % Final    Comment:    (NOTE) Diagnosis of Diabetes The following HbA1c ranges recommended by the American Diabetes Association (ADA) may be used as an aid in the diagnosis of diabetes mellitus.  Hemoglobin             Suggested A1C NGSP%              Diagnosis  <5.7                   Non Diabetic  5.7-6.4                Pre-Diabetic  >6.4  Diabetic  <7.0                   Glycemic control for                       adults with diabetes.    06/13/2023 08:10 AM 5.8 (H) 4.8 - 5.6 % Final    Comment:             Prediabetes: 5.7 - 6.4          Diabetes: >6.4          Glycemic control for adults with diabetes: <7.0    CBG: Recent Labs  Lab 02/10/24 0153 02/10/24 0306 02/10/24 0403 02/10/24 0511 02/10/24 0700  GLUCAP 107* 116* 115* 141* 124*    The patient is critically ill due to acute RV failure with cardiogenic shock status post Protek duo ECMO cannula/acute respiratory failure with hypoxia/acute kidney injury/status post mitral valve replacement. Critical care was necessary to treat or prevent imminent or life-threatening deterioration.  Critical care was time spent personally by me on the following activities: development of treatment plan with patient and/or surrogate as well as nursing, discussions with consultants, evaluation of patient's response to treatment, examination of patient, obtaining history from patient or surrogate, ordering and performing  treatments and interventions, ordering and review of laboratory studies, ordering and review of radiographic studies, pulse oximetry, re-evaluation of patient's condition and participation in multidisciplinary rounds.   During this encounter critical care time was devoted to patient care services described in this note for 49 minutes.   Valinda Novas, MD  Pulmonary Critical Care See Amion for pager If no response to pager, please call [phone] until 7pm After 7pm, Please call E-link [phone]

## 2024-02-10 NOTE — CV Procedure (Signed)
 ECMO NOTE:   Indication: Post operative RV failure   Initial cannulation date: 12/6   ECMO type: V-PA ECMO   Dual lumen inflow/return cannula:   1) 32 Fr Protek Duo catheter placed    ECMO events:   - Initial cannulation 12/6 - Cannula adjustment x2 on 12/6 with improvement in flows - Coagulopathic overnight from multiple sites     Daily data:   Flow 2.19L RPM 2825 Sweep  0.5L FiO2 at 60%    Plan:  Continue ECMO support. Worsening R lung opacities, based on bronch appears to be asymmetric edema Now on milirnone, levo at 5, and vaso at 0.04 Wean pressors as able     Discussed in multidisciplinary fashion on ECMO rounds with CCM, Cardiology, ECMO coordinator/specialist, RT, PharmD and nursing staff all present.

## 2024-02-10 NOTE — Progress Notes (Signed)
 PT Cancellation Note  Patient Details Name: Tracy Baker MRN: [id number] DOB: [birth year]   Cancelled Treatment:    Reason Eval/Treat Not Completed: Patient not medically ready this morning, per discussion with RN, pt remains sedated and intubated. Will continue to follow and re-evaluate as medically appropriate.   Izetta Call, PT, DPT   Acute Rehabilitation Department Office [phone] Secure Chat Communication Preferred   Izetta JULIANNA Call 02/10/2024, 10:55 AM

## 2024-02-10 NOTE — Progress Notes (Signed)
 EVENING ROUNDS NOTE :     [address] [place] [employer] [address]             [phone]               1 Day Post-Op Procedure(s) (LRB): ECMO CANNULATION (N/A)   Total Length of Stay:  LOS: 39 days  Events:   No events Creat up, will follow    BP (!) 111/59   Pulse 80   Temp 97.9 F (36.6 C)   Resp (!) 0   Ht 5' 4 (1.626 m)   Wt 78 kg Comment: weighed with one sheet and SCD sleeves; no pillow, gown, or blanket  SpO2 100%   BMI 29.52 kg/m   PAP: (27-65)/(6-36) 35/26 CVP:  [11 mmHg-24 mmHg] 15 mmHg  Vent Mode: SIMV/PC/PS FiO2 (%):  [40 %-60 %] 60 % Set Rate:  [16 bmp] 16 bmp Vt Set:  [440 mL] 440 mL PEEP:  [5 cmH20] 5 cmH20 Pressure Support:  [10 cmH20] 10 cmH20 Plateau Pressure:  [18 cmH20-24 cmH20] 18 cmH20   albumin  human 999 mL/hr at 02/10/24 1800   DOBUTamine  5 mcg/kg/min (02/10/24 1800)   fentaNYL  infusion INTRAVENOUS 200 mcg/hr (02/10/24 1800)   fluconazole  (DIFLUCAN ) IV Stopped (02/10/24 1315)   furosemide  (LASIX ) 200 mg in dextrose  5 % 100 mL (2 mg/mL) infusion 20 mg/hr (02/10/24 1800)   insulin  0.6 Units/hr (02/10/24 1800)   meropenem  (MERREM ) IV 200 mL/hr at 02/10/24 1800   midazolam  4 mg/hr (02/10/24 1800)   norepinephrine  (LEVOPHED ) Adult infusion 4 mcg/min (02/10/24 1800)   vasopressin  0.04 Units/min (02/10/24 1800)    I/O last 3 completed shifts: In: 6520.9 [I.V.:2959.7; Aonni:8406; NG/GT:110; IV Piggyback:1858.2] Out: 2583 [Urine:1783; Emesis/NG output:350; Chest Tube:450]      Latest Ref Rng & Units 02/10/2024    6:52 PM 02/10/2024    6:10 PM 02/10/2024    5:11 PM  CBC  Hemoglobin 12.0 - 15.0 g/dL 9.5  9.5  9.9   Hematocrit 36.0 - 46.0 % 28.0  28.0  29.0        Latest Ref Rng & Units 02/10/2024    6:52 PM 02/10/2024    6:10 PM 02/10/2024    5:11 PM  BMP  Sodium 135 - 145 mmol/L 141  140  140   Potassium 3.5 - 5.1 mmol/L 4.2  4.2  4.3     ABG    Component Value Date/Time   PHART 7.377 02/10/2024 1852   PCO2ART  38.4 02/10/2024 1852   PO2ART 110 (H) 02/10/2024 1852   HCO3 22.6 02/10/2024 1852   TCO2 24 02/10/2024 1852   ACIDBASEDEF 2.0 02/10/2024 1852   O2SAT 98 02/10/2024 1852       Linnie Rayas, MD 02/10/2024 7:48 PM

## 2024-02-10 NOTE — Progress Notes (Signed)
 OT Cancellation Note  Patient Details Name: Tracy Baker MRN: [id number] DOB: Jul 15, 1955   Cancelled Treatment:    Reason Eval/Treat Not Completed: Patient not medically ready Pt sedated and intubated. Discussed with RN; will check back at a later date for OT re-eval  Mliss Fish 02/10/2024, 10:27 AM

## 2024-02-10 NOTE — Progress Notes (Signed)
 ECMO Flow Trial   A flow trial was started at 0835 per Dr. Zenaida.  Flows dropped to 1.54 with RPMs at 2270. Pt was able to maintain BP. 115/53 (68)  The following trial parameters have been discussed: pH 7.35-7.45,  pO2 > 100.  Flow trial passed, and flow goal lowered from 2.0-2.5 to 2.0-2.1 per ECMO physician.   ABG    Latest Ref Rng & Units 02/10/2024    8:46 AM 02/10/2024   10:05 AM  ABG - Last 2 Results  PH, Arterial 7.35 - 7.45 7.410  7.458   PCO2 arterial 32 - 48 mmHg 36.2  33.0   PO2, Arterial 83 - 108 mmHg 74  110   Bicarbonate 20.0 - 28.0 mmol/L 23.1  23.5   Acid-Base Excess 0.0 - 2.0 mmol/L  0.0   Acid-base deficit 0.0 - 2.0 mmol/L 1.0    O2 Saturation % 95  99     ABG and latic acid drawn at 0846 taken 5 mins into flow trial with flows at 1.54. Lactic 1>0.96. Flow retuned to 2.1 and post flow trial gas drawn at 1005.

## 2024-02-10 NOTE — Procedures (Signed)
 Bronchoscopy Procedure Note  Tracy Baker  [id number]  12-Nov-1955  Date:02/10/24  Time:4:14 PM   Provider Performing:Dhani Imel   Procedure(s):  Flexible bronchoscopy with bronchial alveolar lavage (68375)  Indication(s) Acute respiratory failure  Consent Risks of the procedure as well as the alternatives and risks of each were explained to the patient and/or caregiver.  Consent for the procedure was obtained and is signed in the bedside chart  Anesthesia Etomidate  and Rocuronium    Time Out Verified patient identification, verified procedure, site/side was marked, verified correct patient position, special equipment/implants available, medications/allergies/relevant history reviewed, required imaging and test results available.   Sterile Technique Usual hand hygiene, masks, gowns, and gloves were used   Procedure Description Bronchoscope advanced through endotracheal tube and into airway.  Airways were examined down to subsegmental level with findings noted below.   Following diagnostic evaluation, BAL(s) performed in RLL with normal saline and return of Mucus fluid  Findings: Small amount of thin mucus secretions noted throughout air way tree. BAL was done in RLL   Complications/Tolerance None; patient tolerated the procedure well. Chest X-ray is needed post procedure.   EBL Minimal   Specimen(s) BAL

## 2024-02-10 NOTE — Progress Notes (Signed)
 Pharmacy Antibiotic Note  Tracy Baker is a 68 y.o. female admitted on 12/28/2023 with PA-ECMO.  Pharmacy has been consulted for merrem  dosing.  Cannulated with Protek Duo 12/6.   Initially consulted for vancomycin  post cannulation.   After additional manipulation of cannula 12/6, decision made to add merrem  as well.  D#2 vancomycin , meropenem .  AKI worsening today despite high dose diuretics.  12/6 VR @1605  = 21 mcg/dL 87/2 VR @0955  = 19 mcg/dL Estimated half life = 844y using 2 level kinetics.    Plan: Continue Fluconazole  IV 400 mg daily Continue Merrem  1g IV q12 Variable vancomycin  based on levels F/u decannulation plans Monday, would not obtain a level until 12/9 unless started on RRT Redose when VR < 10  Height: 5' 4 (162.6 cm) Weight: 78 kg (171 lb 15.3 oz) (weighed with one sheet and SCD sleeves; no pillow, gown, or blanket) IBW/kg (Calculated) : 54.7  Temp (24hrs), Avg:97.8 F (36.6 C), Min:97.5 F (36.4 C), Max:99 F (37.2 C)  Recent Labs  Lab 02/09/24 0203 02/09/24 0325 02/09/24 1148 02/09/24 1310 02/09/24 1312 02/09/24 1605 02/09/24 1721 02/09/24 1723 02/09/24 2356 02/10/24 0511 02/10/24 0518 02/10/24 0846 02/10/24 0955 02/10/24 1239  WBC 13.1*  --   --  6.7  --   --  6.9  --  7.3 7.2  --   --   --   --   CREATININE 1.78*  --   --  1.70*  --   --  1.75*  --   --  2.03*  2.08*  --   --   --  2.16*  LATICACIDVEN  --    < > 2.8*  --  3.0*  --   --  1.5  --   --  1.0 1.0  --   --   VANCORANDOM  --   --   --   --   --  21  --   --   --   --   --   --  19  --    < > = values in this interval not displayed.    Estimated Creatinine Clearance: 25.2 mL/min (A) (by C-G formula based on SCr of 2.16 mg/dL (H)).    Allergies  Allergen Reactions   Codeine Anaphylaxis and Nausea And Vomiting   Red Dye #40 (Allura Red) Nausea Only    Antimicrobials this admission: Zosyn 10/19>10/20 CRO 10/20 >>10/22 Azithro 10/22 >> 10/24 Fluconazole  10/19 >>  10/25 Micafungin  10/25 >> 10/11 Fluconazole  11/11 >> [12/18] Linezolid  11/13 >> 11/17 Cefepime  11/13 >> 11/17 Vanc 12/5>> Merrem  12/6>>   Microbiology results: 10/18 Bcx: 1/2 Candida albicans  10/20 BCx: budding yeasts  10/22 Bcx: Neg 10/24 Bcx: drawn at [facility] 10/24 Bcx:  neg 10/25 Bcx: neg 10/18 UCx: budding yeasts   Maurilio Fila, PharmD Clinical Pharmacist 02/10/2024  3:50 PM

## 2024-02-10 NOTE — Progress Notes (Signed)
 Advanced Heart Failure Rounding Note  Cardiologist: Lonni Cash, MD  Chief Complaint: S/p MV Repair / MV endocarditis  Patient Profile   Tracy Baker is a 68 y.o. female with hx of recent nephrolithiasis s/p ureteral stent and lithotripsy at [facility] 10/10 with stent removal. 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 Post-op course c/b post cardiotomy shock, junctional bradycardia, afib w/ RVR and HAP.   Subjective:    RHC 12/2 (on 0.125 of Milrinone ): RA 4, PA 42/13 (22), PCW 12 with Vwaves to 20, CO/CI TD 3.37/2.03, PAPi 7.5  12/5 S/P Redo MVR - Intra Op given  4UPRBCs, 2UPLTs, 2FFP, 1Cyro  12/6 Protek Duo cannulation  Significantly weaned pressors overnight. Urine output unfortunately dropping, Third spaced fluid on exam, worsening CXR suspect may be more intravascularly dry. Tolerated wean of Protek to 1.5L for 5-10 minutes, normal lactic acid and gas following.    Objective:    Weight Range: 78 kg Body mass index is 29.52 kg/m.   Vital Signs:   Temp:  [97.5 F (36.4 C)-99 F (37.2 C)] 98.4 F (36.9 C) (12/07 1313) Pulse Rate:  [71-197] 80 (12/07 1313) Resp:  [0-25] 12 (12/07 1313) BP: (94-129)/(52-69) 100/52 (12/07 1300) SpO2:  [87 %-100 %] 95 % (12/07 1313) Arterial Line BP: (63-341)/(2-332) 99/53 (12/07 1313) FiO2 (%):  [40 %-60 %] 40 % (12/07 1116) Weight:  [78 kg] 78 kg (12/07 0500) Last BM Date : 02/10/24  Weight change: Filed Weights   02/08/24 0524 02/09/24 0500 02/10/24 0500  Weight: 64.3 kg 76.7 kg 78 kg   Intake/Output:  Intake/Output Summary (Last 24 hours) at 02/10/2024 1324 Last data filed at 02/10/2024 1200 Gross per 24 hour  Intake 4656.86 ml  Output 2073 ml  Net 2583.86 ml    Physical Exam  GENERAL: Intubated PULM:  Ventilated breath sounds CARDIAC:  JVP: improving         Regular rate with regular rhythm. Systolic murmur.  1+ whole body edema. Cannula site with steady oozing ABDOMEN: Soft,  non-tender, non-distended. NEUROLOGIC: Sedated    Telemetry   Paced rhythm, now at 80. No underlying rhythm  Labs    CBC Recent Labs    02/09/24 2356 02/09/24 2358 02/10/24 0511 02/10/24 0513 02/10/24 0700 02/10/24 0745 02/10/24 0846 02/10/24 1005  WBC 7.3  --  7.2  --   --   --   --   --   HGB 10.7*   < > 10.0*   < >  --    < > 9.9* 9.9*  HCT 30.6*   < > 29.2*   < >  --    < > 29.0* 29.0*  MCV 89.2  --  89.0  --   --   --   --   --   PLT 84*  --  77*  --  76*  --   --   --    < > = values in this interval not displayed.   Basic Metabolic Panel Recent Labs    87/93/74 1721 02/09/24 1724 02/10/24 0511 02/10/24 0513 02/10/24 0846 02/10/24 1005  NA 140   < > 141  142   < > 140 140  K 4.2   < > 3.9  3.9   < > 4.3 4.7  CL 104  --  105  104  --   --   --   CO2 23  --  24  23  --   --   --  GLUCOSE 119*  --  121*  122*  --   --   --   BUN 23  --  26*  24*  --   --   --   CREATININE 1.75*  --  2.03*  2.08*  --   --   --   CALCIUM  7.7*  --  7.7*  8.0*  --   --   --   MG 2.5*  --  2.5*  --   --   --   PHOS  --   --  5.9*  5.9*  --   --   --    < > = values in this interval not displayed.   Liver Function Tests Recent Labs    02/09/24 1310 02/10/24 0511  AST 99* 76*  ALT 12 6  ALKPHOS 56 67  BILITOT 0.9 1.2  PROT 3.6* 4.4*  ALBUMIN  2.5* 3.1*  3.2*    Medications:    Scheduled Medications:  sodium chloride    Intravenous Once   Chlorhexidine  Gluconate Cloth  6 each Topical Daily   Chlorhexidine  Gluconate Cloth  6 each Topical Daily   clonazePAM   0.5 mg Per Tube BID   mouth rinse  15 mL Mouth Rinse Q2H   oxyCODONE   2.5 mg Per Tube Q6H   pantoprazole  (PROTONIX ) IV  40 mg Intravenous QHS   polyethylene glycol  17 g Per Tube Daily   QUEtiapine   25 mg Per Tube BID   senna-docusate  2 tablet Per Tube QHS   sodium chloride  flush  10-40 mL Intracatheter Q12H   sodium chloride  flush  3 mL Intravenous Q12H   vancomycin  variable dose per unstable renal  function (pharmacist dosing)   Does not apply See admin instructions    Infusions:  albumin  human 999 mL/hr at 02/10/24 1100   amiodarone  30 mg/hr (02/10/24 1200)   fentaNYL  infusion INTRAVENOUS 200 mcg/hr (02/10/24 1200)   fluconazole  (DIFLUCAN ) IV 100 mL/hr at 02/10/24 1200   furosemide  (LASIX ) 200 mg in dextrose  5 % 100 mL (2 mg/mL) infusion 20 mg/hr (02/10/24 1200)   insulin  0.7 Units/hr (02/10/24 1200)   meropenem  (MERREM ) IV Stopped (02/10/24 9372)   midazolam  4 mg/hr (02/10/24 1200)   milrinone  0.25 mcg/kg/min (02/10/24 1200)   norepinephrine  (LEVOPHED ) Adult infusion 5 mcg/min (02/10/24 1200)   vasopressin  0.04 Units/min (02/10/24 1256)    PRN Medications: albumin  human, dextrose , fentaNYL , midazolam , mouth rinse, sodium chloride  flush, sodium chloride  flush  Assessment/Plan   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 Kerrin for Candida endocarditis. Pre-op L/RHC 01/04/24 with no CAD and normal L/R heart pressures.  - Failed initial Milrinone  wean. Eventually weaned off, unfortunately restarted 12/1 for low output.  - TEE 11/18  with severe mitral regurgitation of repaired valve - S/P Redo MVR complicated by rapidly worsening RV failure -Protek Cannulation 12/6 -Encouraging hemodynamics, but worsening CXR and renal function are concerning - Flow around 2-2.25L - Weaned to 1.5L successfully - No OAC with INR elevated - Continue IV diuresis today - Hopefully off tomorrow - Abx with vancomycin , meropenem . Hopefully deescalate rapidly - Continue support with IV milrinone  at 0.25, levo as needed, vaso  Coagulopathy/thrombocytopenia/ABLA anemia:  - Thrombocytopenia improved - 3 units FFP for elevated INR - CT look good - Hgb >8  Sinus arrest - Stop amiodarone  with no underlying rhythm - suspect due to  underlying swelling  Afib - Post operative - No OAC, in afib on 12/6  AKI on CKD 3b - Scr baseline 1.4-1.5 -Worsening in the setting of shock, potentially ATN with initial RV failure. Will follow - continue lasix  gtt at 20, diuril , diamox  today   Protein-calorie malnutrition  Epistaxis  - s/p bilateral nasal packing per ENT 12/1    - Off AC  Length of stay: 39  [name], MD  02/10/24, 1:24 PM  CRITICAL CARE Performed by: [name]   Total critical care time: 50 minutes  Critical care time was exclusive of separately billable procedures and treating other patients.  Critical care was necessary to treat or prevent imminent or life-threatening deterioration.  Critical care was time spent personally by me on the following activities: development of treatment plan with patient and/or surrogate as well as nursing, discussions with consultants, evaluation of patient's response to treatment, examination of patient, obtaining history from patient or surrogate, ordering and performing treatments and interventions, ordering and review of laboratory studies, ordering and review of radiographic studies, pulse oximetry and re-evaluation of patient's condition.

## 2024-02-11 ENCOUNTER — Inpatient Hospital Stay (HOSPITAL_COMMUNITY)

## 2024-02-11 ENCOUNTER — Encounter (HOSPITAL_COMMUNITY): Payer: Self-pay | Admitting: Cardiology

## 2024-02-11 DIAGNOSIS — N1831 Chronic kidney disease, stage 3a: Secondary | ICD-10-CM | POA: Diagnosis not present

## 2024-02-11 DIAGNOSIS — I340 Nonrheumatic mitral (valve) insufficiency: Secondary | ICD-10-CM | POA: Diagnosis not present

## 2024-02-11 DIAGNOSIS — R579 Shock, unspecified: Secondary | ICD-10-CM

## 2024-02-11 DIAGNOSIS — E43 Unspecified severe protein-calorie malnutrition: Secondary | ICD-10-CM | POA: Diagnosis not present

## 2024-02-11 DIAGNOSIS — R570 Cardiogenic shock: Secondary | ICD-10-CM | POA: Diagnosis not present

## 2024-02-11 DIAGNOSIS — J189 Pneumonia, unspecified organism: Secondary | ICD-10-CM | POA: Diagnosis not present

## 2024-02-11 DIAGNOSIS — K567 Ileus, unspecified: Secondary | ICD-10-CM | POA: Diagnosis not present

## 2024-02-11 DIAGNOSIS — E162 Hypoglycemia, unspecified: Secondary | ICD-10-CM

## 2024-02-11 DIAGNOSIS — J9601 Acute respiratory failure with hypoxia: Secondary | ICD-10-CM | POA: Diagnosis not present

## 2024-02-11 DIAGNOSIS — D62 Acute posthemorrhagic anemia: Secondary | ICD-10-CM | POA: Diagnosis not present

## 2024-02-11 DIAGNOSIS — N179 Acute kidney failure, unspecified: Secondary | ICD-10-CM | POA: Diagnosis not present

## 2024-02-11 DIAGNOSIS — Z9281 Personal history of extracorporeal membrane oxygenation (ECMO): Secondary | ICD-10-CM

## 2024-02-11 LAB — POCT I-STAT 7, (LYTES, BLD GAS, ICA,H+H)
Acid-base deficit: 1 mmol/L (ref 0.0–2.0)
Acid-base deficit: 2 mmol/L (ref 0.0–2.0)
Acid-base deficit: 2 mmol/L (ref 0.0–2.0)
Acid-base deficit: 2 mmol/L (ref 0.0–2.0)
Acid-base deficit: 2 mmol/L (ref 0.0–2.0)
Acid-base deficit: 4 mmol/L — ABNORMAL HIGH (ref 0.0–2.0)
Bicarbonate: 21.4 mmol/L (ref 20.0–28.0)
Bicarbonate: 21.8 mmol/L (ref 20.0–28.0)
Bicarbonate: 21.9 mmol/L (ref 20.0–28.0)
Bicarbonate: 22.4 mmol/L (ref 20.0–28.0)
Bicarbonate: 22.9 mmol/L (ref 20.0–28.0)
Bicarbonate: 23.2 mmol/L (ref 20.0–28.0)
Calcium, Ion: 1.01 mmol/L — ABNORMAL LOW (ref 1.15–1.40)
Calcium, Ion: 1.02 mmol/L — ABNORMAL LOW (ref 1.15–1.40)
Calcium, Ion: 1.03 mmol/L — ABNORMAL LOW (ref 1.15–1.40)
Calcium, Ion: 1.04 mmol/L — ABNORMAL LOW (ref 1.15–1.40)
Calcium, Ion: 1.04 mmol/L — ABNORMAL LOW (ref 1.15–1.40)
Calcium, Ion: 1.05 mmol/L — ABNORMAL LOW (ref 1.15–1.40)
HCT: 29 % — ABNORMAL LOW (ref 36.0–46.0)
HCT: 29 % — ABNORMAL LOW (ref 36.0–46.0)
HCT: 29 % — ABNORMAL LOW (ref 36.0–46.0)
HCT: 29 % — ABNORMAL LOW (ref 36.0–46.0)
HCT: 29 % — ABNORMAL LOW (ref 36.0–46.0)
HCT: 44 % (ref 36.0–46.0)
Hemoglobin: 15 g/dL (ref 12.0–15.0)
Hemoglobin: 9.9 g/dL — ABNORMAL LOW (ref 12.0–15.0)
Hemoglobin: 9.9 g/dL — ABNORMAL LOW (ref 12.0–15.0)
Hemoglobin: 9.9 g/dL — ABNORMAL LOW (ref 12.0–15.0)
Hemoglobin: 9.9 g/dL — ABNORMAL LOW (ref 12.0–15.0)
Hemoglobin: 9.9 g/dL — ABNORMAL LOW (ref 12.0–15.0)
O2 Saturation: 100 %
O2 Saturation: 100 %
O2 Saturation: 91 %
O2 Saturation: 98 %
O2 Saturation: 99 %
O2 Saturation: 99 %
Patient temperature: 36.8
Patient temperature: 36.8
Patient temperature: 36.8
Patient temperature: 36.8
Patient temperature: 36.8
Patient temperature: 37
Potassium: 3.9 mmol/L (ref 3.5–5.1)
Potassium: 4 mmol/L (ref 3.5–5.1)
Potassium: 4.1 mmol/L (ref 3.5–5.1)
Potassium: 4.1 mmol/L (ref 3.5–5.1)
Potassium: 4.3 mmol/L (ref 3.5–5.1)
Potassium: 4.4 mmol/L (ref 3.5–5.1)
Sodium: 140 mmol/L (ref 135–145)
Sodium: 140 mmol/L (ref 135–145)
Sodium: 140 mmol/L (ref 135–145)
Sodium: 140 mmol/L (ref 135–145)
Sodium: 141 mmol/L (ref 135–145)
Sodium: 141 mmol/L (ref 135–145)
TCO2: 23 mmol/L (ref 22–32)
TCO2: 23 mmol/L (ref 22–32)
TCO2: 23 mmol/L (ref 22–32)
TCO2: 23 mmol/L (ref 22–32)
TCO2: 24 mmol/L (ref 22–32)
TCO2: 24 mmol/L (ref 22–32)
pCO2 arterial: 31.1 mmHg — ABNORMAL LOW (ref 32–48)
pCO2 arterial: 32.2 mmHg (ref 32–48)
pCO2 arterial: 34.2 mmHg (ref 32–48)
pCO2 arterial: 38.5 mmHg (ref 32–48)
pCO2 arterial: 39.6 mmHg (ref 32–48)
pCO2 arterial: 40.4 mmHg (ref 32–48)
pH, Arterial: 7.34 — ABNORMAL LOW (ref 7.35–7.45)
pH, Arterial: 7.36 (ref 7.35–7.45)
pH, Arterial: 7.386 (ref 7.35–7.45)
pH, Arterial: 7.412 (ref 7.35–7.45)
pH, Arterial: 7.439 (ref 7.35–7.45)
pH, Arterial: 7.465 — ABNORMAL HIGH (ref 7.35–7.45)
pO2, Arterial: 116 mmHg — ABNORMAL HIGH (ref 83–108)
pO2, Arterial: 129 mmHg — ABNORMAL HIGH (ref 83–108)
pO2, Arterial: 131 mmHg — ABNORMAL HIGH (ref 83–108)
pO2, Arterial: 183 mmHg — ABNORMAL HIGH (ref 83–108)
pO2, Arterial: 238 mmHg — ABNORMAL HIGH (ref 83–108)
pO2, Arterial: 59 mmHg — ABNORMAL LOW (ref 83–108)

## 2024-02-11 LAB — TYPE AND SCREEN
ABO/RH(D): A POS
Antibody Screen: NEGATIVE
Unit division: 0
Unit division: 0
Unit division: 0
Unit division: 0
Unit division: 0
Unit division: 0
Unit division: 0
Unit division: 0
Unit division: 0
Unit division: 0
Unit division: 0
Unit division: 0

## 2024-02-11 LAB — BPAM RBC
Blood Product Expiration Date: 202512272359
Blood Product Expiration Date: 202512272359
Blood Product Expiration Date: 202512272359
Blood Product Expiration Date: 202512272359
Blood Product Expiration Date: 202512272359
Blood Product Expiration Date: 202512222359
Blood Product Expiration Date: 202512232359
Blood Product Expiration Date: 202512272359
Blood Product Expiration Date: 202512272359
Blood Product Expiration Date: 202512272359
Blood Product Expiration Date: 202512272359
Blood Product Unit Number: 202512272359
ISSUE DATE / TIME: 202512060338
ISSUE DATE / TIME: 202512061208
ISSUE DATE / TIME: 202512061208
ISSUE DATE / TIME: 202512050718
ISSUE DATE / TIME: 202512050718
ISSUE DATE / TIME: 202512050718
ISSUE DATE / TIME: 202512050718
ISSUE DATE / TIME: 202512060857
ISSUE DATE / TIME: 202512060857
ISSUE DATE / TIME: 202512060857
PRODUCT CODE: 202512060857
PRODUCT CODE: 202512272359
Unit Type and Rh: 6200
Unit Type and Rh: 6200
Unit Type and Rh: 6200
Unit Type and Rh: 6200
Unit Type and Rh: 6200
Unit Type and Rh: 202512272359
Unit Type and Rh: 6200
Unit Type and Rh: 6200
Unit Type and Rh: 6200
Unit Type and Rh: 6200
Unit Type and Rh: 6200
Unit Type and Rh: 6200
Unit Type and Rh: 6200
Unit Type and Rh: 6200

## 2024-02-11 LAB — PROTIME-INR
INR: 1.6 — ABNORMAL HIGH (ref 0.8–1.2)
Prothrombin Time: 19.9 s — ABNORMAL HIGH (ref 11.4–15.2)

## 2024-02-11 LAB — GLUCOSE, CAPILLARY
Glucose-Capillary: 93 mg/dL (ref 70–99)
Glucose-Capillary: 77 mg/dL (ref 70–99)
Glucose-Capillary: 98 mg/dL (ref 70–99)
Glucose-Capillary: 98 mg/dL (ref 70–99)
Glucose-Capillary: 86 mg/dL (ref 70–99)
Glucose-Capillary: 100 mg/dL — ABNORMAL HIGH (ref 70–99)
Glucose-Capillary: 59 mg/dL — ABNORMAL LOW (ref 70–99)
Glucose-Capillary: 111 mg/dL — ABNORMAL HIGH (ref 70–99)
Glucose-Capillary: 154 mg/dL — ABNORMAL HIGH (ref 70–99)
Glucose-Capillary: 103 mg/dL — ABNORMAL HIGH (ref 70–99)
Glucose-Capillary: 118 mg/dL — ABNORMAL HIGH (ref 70–99)
Glucose-Capillary: 113 mg/dL — ABNORMAL HIGH (ref 70–99)

## 2024-02-11 LAB — LACTATE DEHYDROGENASE: LDH: 579 U/L — ABNORMAL HIGH (ref 105–235)

## 2024-02-11 LAB — COOXEMETRY PANEL
Carboxyhemoglobin: 2.1 % — ABNORMAL HIGH (ref 0.5–1.5)
Carboxyhemoglobin: 0.8 % (ref 0.5–1.5)
Methemoglobin: 0.9 % (ref 0.0–1.5)
Methemoglobin: <0.7 % (ref 0.0–1.5)
O2 Saturation: 56.9 %
O2 Saturation: 64.2 %
Total hemoglobin: 10.5 g/dL — ABNORMAL LOW (ref 12.0–16.0)
Total hemoglobin: 15.1 g/dL (ref 12.0–16.0)

## 2024-02-11 LAB — BASIC METABOLIC PANEL WITH GFR
Anion gap: 12 (ref 5–15)
Anion gap: 14 (ref 5–15)
BUN: 34 mg/dL — ABNORMAL HIGH (ref 8–23)
BUN: 36 mg/dL — ABNORMAL HIGH (ref 8–23)
CO2: 22 mmol/L (ref 22–32)
CO2: 21 mmol/L — ABNORMAL LOW (ref 22–32)
Calcium: 7.7 mg/dL — ABNORMAL LOW (ref 8.9–10.3)
Calcium: 7.8 mg/dL — ABNORMAL LOW (ref 8.9–10.3)
Chloride: 105 mmol/L (ref 98–111)
Chloride: 106 mmol/L (ref 98–111)
Creatinine, Ser: 2.5 mg/dL — ABNORMAL HIGH (ref 0.44–1.00)
Creatinine, Ser: 2.7 mg/dL — ABNORMAL HIGH (ref 0.44–1.00)
GFR, Estimated: 20 mL/min — ABNORMAL LOW (ref >60)
GFR, Estimated: 19 mL/min — ABNORMAL LOW (ref >60)
Glucose, Bld: 107 mg/dL — ABNORMAL HIGH (ref 70–99)
Glucose, Bld: 103 mg/dL — ABNORMAL HIGH (ref 70–99)
Potassium: 4.7 mmol/L (ref 3.5–5.1)
Potassium: 4.2 mmol/L (ref 3.5–5.1)
Sodium: 139 mmol/L (ref 135–145)
Sodium: 141 mmol/L (ref 135–145)

## 2024-02-11 LAB — CBC
HCT: 31.2 % — ABNORMAL LOW (ref 36.0–46.0)
HCT: 42.1 % (ref 36.0–46.0)
Hemoglobin: 10.2 g/dL — ABNORMAL LOW (ref 12.0–15.0)
Hemoglobin: 14.3 g/dL (ref 12.0–15.0)
MCH: 30.5 pg (ref 26.0–34.0)
MCH: 30.4 pg (ref 26.0–34.0)
MCHC: 32.7 g/dL (ref 30.0–36.0)
MCHC: 34 g/dL (ref 30.0–36.0)
MCV: 93.4 fL (ref 80.0–100.0)
MCV: 89.4 fL (ref 80.0–100.0)
Platelets: 52 K/uL — ABNORMAL LOW (ref 150–400)
Platelets: 51 K/uL — ABNORMAL LOW (ref 150–400)
RBC: 3.34 MIL/uL — ABNORMAL LOW (ref 3.87–5.11)
RBC: 4.71 MIL/uL (ref 3.87–5.11)
RDW: 21.5 % — ABNORMAL HIGH (ref 11.5–15.5)
RDW: 19.8 % — ABNORMAL HIGH (ref 11.5–15.5)
WBC: 5.7 K/uL (ref 4.0–10.5)
WBC: 8.2 K/uL (ref 4.0–10.5)
nRBC: 0 % (ref 0.0–0.2)
nRBC: 0 % (ref 0.0–0.2)

## 2024-02-11 LAB — ECHO INTRAOPERATIVE TEE
AR max vel: 1.81 cm2
AV Area VTI: 1.29 cm2
AV Area mean vel: 1.75 cm2
AV Mean grad: 3 mmHg
AV Peak grad: 7.8 mmHg
Ao pk vel: 1.4 m/s
Height: 64 in
MV M vel: 6.32 m/s
MV Peak grad: 159.8 mmHg
MV VTI: 0.82 cm2
MV Vena cont: 0.72 cm
Radius: 2 cm
Weight: 2268.09 [oz_av]

## 2024-02-11 LAB — PREPARE FRESH FROZEN PLASMA

## 2024-02-11 LAB — BPAM FFP
Blood Product Expiration Date: 202512092359
Blood Product Expiration Date: 202512092359
Blood Product Expiration Date: 202512112359
Blood Product Expiration Date: 202512092359
Blood Product Expiration Date: 202512092359
ISSUE DATE / TIME: 202512070816
ISSUE DATE / TIME: 202512070816
ISSUE DATE / TIME: 202512070816
ISSUE DATE / TIME: 202512071428
ISSUE DATE / TIME: 202512071428
Unit Type and Rh: 6200
Unit Type and Rh: 6200
Unit Type and Rh: 6200
Unit Type and Rh: 6200
Unit Type and Rh: 6200

## 2024-02-11 LAB — MAGNESIUM
Magnesium: 2.4 mg/dL (ref 1.7–2.4)
Magnesium: 2.3 mg/dL (ref 1.7–2.4)

## 2024-02-11 LAB — PREPARE RBC (CROSSMATCH)

## 2024-02-11 LAB — CG4 I-STAT (LACTIC ACID)
Lactic Acid, Venous: 1.3 mmol/L (ref 0.5–1.9)
Lactic Acid, Venous: 1.2 mmol/L (ref 0.5–1.9)

## 2024-02-11 LAB — HEPATIC FUNCTION PANEL
ALT: <5 U/L (ref 0–44)
AST: 52 U/L — ABNORMAL HIGH (ref 15–41)
Albumin: 2.7 g/dL — ABNORMAL LOW (ref 3.5–5.0)
Alkaline Phosphatase: 86 U/L (ref 38–126)
Bilirubin, Direct: 0.6 mg/dL — ABNORMAL HIGH (ref 0.0–0.2)
Indirect Bilirubin: 0.8 mg/dL (ref 0.3–0.9)
Total Bilirubin: 1.4 mg/dL — ABNORMAL HIGH (ref 0.0–1.2)
Total Protein: 4.6 g/dL — ABNORMAL LOW (ref 6.5–8.1)

## 2024-02-11 LAB — SURGICAL PATHOLOGY

## 2024-02-11 LAB — PHOSPHORUS: Phosphorus: 6.3 mg/dL — ABNORMAL HIGH (ref 2.5–4.6)

## 2024-02-11 LAB — FIBRINOGEN: Fibrinogen: 343 mg/dL (ref 210–475)

## 2024-02-11 MED ORDER — FUROSEMIDE 10 MG/ML IJ SOLN
80.0000 mg | Freq: Once | INTRAMUSCULAR | Status: AC
  Administered 2024-02-11: 80 mg via INTRAVENOUS
  Filled 2024-02-11: qty 8

## 2024-02-11 MED ORDER — PERFLUTREN LIPID MICROSPHERE
1.0000 mL | INTRAVENOUS | Status: AC | PRN
  Administered 2024-02-11: 3 mL via INTRAVENOUS

## 2024-02-11 MED ORDER — PROSOURCE TF20 ENFIT COMPATIBL EN LIQD
60.0000 mL | Freq: Every day | ENTERAL | Status: DC
  Administered 2024-02-11: 60 mL
  Filled 2024-02-11: qty 60

## 2024-02-11 MED ORDER — VITAL HP 1.0 CAL PO LIQD
1000.0000 mL | ORAL | Status: DC
  Administered 2024-02-11: 1000 mL

## 2024-02-11 MED ORDER — INSULIN ASPART 100 UNIT/ML IJ SOLN
0.0000 [IU] | INTRAMUSCULAR | Status: DC
  Administered 2024-02-11 – 2024-02-12 (×2): 2 [IU] via SUBCUTANEOUS
  Administered 2024-02-12 (×3): 1 [IU] via SUBCUTANEOUS
  Administered 2024-02-12 – 2024-02-13 (×3): 2 [IU] via SUBCUTANEOUS
  Administered 2024-02-13 (×3): 1 [IU] via SUBCUTANEOUS
  Administered 2024-02-13: 2 [IU] via SUBCUTANEOUS
  Administered 2024-02-14: 1 [IU] via SUBCUTANEOUS
  Administered 2024-02-14: 2 [IU] via SUBCUTANEOUS
  Administered 2024-02-14 (×2): 1 [IU] via SUBCUTANEOUS
  Administered 2024-02-14 – 2024-02-15 (×2): 2 [IU] via SUBCUTANEOUS
  Administered 2024-02-15: 3 [IU] via SUBCUTANEOUS
  Administered 2024-02-15: 1 [IU] via SUBCUTANEOUS
  Administered 2024-02-15: 2 [IU] via SUBCUTANEOUS
  Administered 2024-02-15: 1 [IU] via SUBCUTANEOUS
  Administered 2024-02-16 (×2): 2 [IU] via SUBCUTANEOUS
  Administered 2024-02-16: 1 [IU] via SUBCUTANEOUS
  Administered 2024-02-16 – 2024-02-17 (×9): 2 [IU] via SUBCUTANEOUS
  Administered 2024-02-18: 20:00:00 1 [IU] via SUBCUTANEOUS
  Administered 2024-02-18 (×4): 2 [IU] via SUBCUTANEOUS
  Administered 2024-02-18 – 2024-02-19 (×4): 1 [IU] via SUBCUTANEOUS
  Administered 2024-02-19 – 2024-02-20 (×4): 2 [IU] via SUBCUTANEOUS
  Administered 2024-02-20: 20:00:00 1 [IU] via SUBCUTANEOUS
  Administered 2024-02-21: 2 [IU] via SUBCUTANEOUS
  Administered 2024-02-21: 13:00:00 1 [IU] via SUBCUTANEOUS
  Administered 2024-02-21: 05:00:00 2 [IU] via SUBCUTANEOUS
  Administered 2024-02-21 – 2024-02-22 (×3): 1 [IU] via SUBCUTANEOUS
  Administered 2024-02-22 – 2024-02-24 (×7): 2 [IU] via SUBCUTANEOUS
  Administered 2024-02-24 – 2024-02-25 (×4): 1 [IU] via SUBCUTANEOUS
  Administered 2024-02-25 – 2024-02-26 (×6): 2 [IU] via SUBCUTANEOUS
  Administered 2024-02-26: 1 [IU] via SUBCUTANEOUS
  Administered 2024-02-26 (×3): 2 [IU] via SUBCUTANEOUS
  Administered 2024-02-27 (×2): 1 [IU] via SUBCUTANEOUS
  Administered 2024-02-27 – 2024-02-28 (×4): 2 [IU] via SUBCUTANEOUS
  Administered 2024-02-28: 1 [IU] via SUBCUTANEOUS
  Administered 2024-02-28: 2 [IU] via SUBCUTANEOUS
  Administered 2024-02-28: 1 [IU] via SUBCUTANEOUS
  Administered 2024-02-28: 2 [IU] via SUBCUTANEOUS
  Administered 2024-02-29 – 2024-03-02 (×8): 1 [IU] via SUBCUTANEOUS
  Administered 2024-03-02 – 2024-03-03 (×3): 2 [IU] via SUBCUTANEOUS
  Administered 2024-03-03 (×2): 1 [IU] via SUBCUTANEOUS
  Administered 2024-03-03: 2 [IU] via SUBCUTANEOUS
  Administered 2024-03-04 – 2024-03-05 (×2): 1 [IU] via SUBCUTANEOUS
  Administered 2024-03-06: 2 [IU] via SUBCUTANEOUS
  Administered 2024-03-06 (×2): 1 [IU] via SUBCUTANEOUS
  Administered 2024-03-07: 2 [IU] via SUBCUTANEOUS
  Administered 2024-03-07 – 2024-03-08 (×3): 1 [IU] via SUBCUTANEOUS
  Administered 2024-03-08: 2 [IU] via SUBCUTANEOUS
  Administered 2024-03-08 – 2024-03-09 (×3): 1 [IU] via SUBCUTANEOUS
  Administered 2024-03-09 – 2024-03-10 (×3): 2 [IU] via SUBCUTANEOUS
  Administered 2024-03-10: 1 [IU] via SUBCUTANEOUS
  Administered 2024-03-10: 2 [IU] via SUBCUTANEOUS
  Filled 2024-02-11 (×2): qty 2
  Filled 2024-02-11: qty 1
  Filled 2024-02-11: qty 3
  Filled 2024-02-11 (×2): qty 1
  Filled 2024-02-11: qty 2
  Filled 2024-02-11: qty 3
  Filled 2024-02-11: qty 2
  Filled 2024-02-11 (×3): qty 1
  Filled 2024-02-11: qty 4
  Filled 2024-02-11: qty 3
  Filled 2024-02-11 (×3): qty 1
  Filled 2024-02-11 (×5): qty 2
  Filled 2024-02-11: qty 1
  Filled 2024-02-11 (×2): qty 2
  Filled 2024-02-11: qty 1
  Filled 2024-02-11 (×2): qty 2
  Filled 2024-02-11: qty 1
  Filled 2024-02-11: qty 2
  Filled 2024-02-11: qty 1
  Filled 2024-02-11 (×2): qty 2
  Filled 2024-02-11: qty 1
  Filled 2024-02-11 (×3): qty 2
  Filled 2024-02-11: qty 1
  Filled 2024-02-11 (×3): qty 2
  Filled 2024-02-11 (×2): qty 1
  Filled 2024-02-11 (×3): qty 2
  Filled 2024-02-11: qty 1
  Filled 2024-02-11 (×5): qty 2
  Filled 2024-02-11: qty 1
  Filled 2024-02-11: qty 4
  Filled 2024-02-11: qty 2
  Filled 2024-02-11: qty 1
  Filled 2024-02-11: qty 2
  Filled 2024-02-11: qty 1
  Filled 2024-02-11 (×5): qty 2
  Filled 2024-02-11: qty 1
  Filled 2024-02-11 (×4): qty 2
  Filled 2024-02-11: qty 1
  Filled 2024-02-11: qty 3
  Filled 2024-02-11: qty 1
  Filled 2024-02-11 (×5): qty 2
  Filled 2024-02-11: qty 1
  Filled 2024-02-11 (×2): qty 2
  Filled 2024-02-11 (×4): qty 1
  Filled 2024-02-11 (×2): qty 2
  Filled 2024-02-11: qty 1
  Filled 2024-02-11 (×2): qty 2
  Filled 2024-02-11 (×2): qty 1
  Filled 2024-02-11 (×4): qty 2
  Filled 2024-02-11: qty 1

## 2024-02-11 MED ORDER — SODIUM CHLORIDE 0.9% FLUSH
10.0000 mL | Freq: Two times a day (BID) | INTRAVENOUS | Status: DC
  Administered 2024-02-12 – 2024-02-16 (×7): 10 mL

## 2024-02-11 MED ORDER — SODIUM CHLORIDE 0.9% IV SOLUTION: Freq: Once | INTRAVENOUS | Status: AC

## 2024-02-11 MED ORDER — SODIUM CHLORIDE 0.9% FLUSH: 10.0000 mL | INTRAVENOUS | Status: DC | PRN

## 2024-02-11 MED ORDER — METOCLOPRAMIDE HCL 5 MG/ML IJ SOLN
5.0000 mg | Freq: Three times a day (TID) | INTRAMUSCULAR | Status: DC
  Administered 2024-02-11 – 2024-02-18 (×22): 5 mg via INTRAVENOUS
  Filled 2024-02-11 (×20): qty 2

## 2024-02-11 MED FILL — Potassium Chloride Inj 2 mEq/ML: INTRAVENOUS | Qty: 40 | Status: AC

## 2024-02-11 MED FILL — Calcium Chloride Inj 10%: INTRAVENOUS | Qty: 10 | Status: AC

## 2024-02-11 MED FILL — Lidocaine HCl Local Preservative Free (PF) Inj 2%: INTRAMUSCULAR | Qty: 14 | Status: AC

## 2024-02-11 MED FILL — Electrolyte-R (PH 7.4) Solution: INTRAVENOUS | Qty: 3000 | Status: AC

## 2024-02-11 MED FILL — Mannitol IV Soln 20%: INTRAVENOUS | Qty: 500 | Status: AC

## 2024-02-11 MED FILL — Heparin Sodium (Porcine) Inj 1000 Unit/ML: INTRAMUSCULAR | Qty: 10 | Status: AC

## 2024-02-11 MED FILL — Heparin Sodium (Porcine) Inj 1000 Unit/ML: Qty: 1000 | Status: AC

## 2024-02-11 MED FILL — Sodium Chloride IV Soln 0.9%: INTRAVENOUS | Qty: 2000 | Status: AC

## 2024-02-11 MED FILL — Sodium Bicarbonate IV Soln 8.4%: INTRAVENOUS | Qty: 150 | Status: AC

## 2024-02-11 MED FILL — Albumin, Human Inj 5%: INTRAVENOUS | Qty: 250 | Status: AC

## 2024-02-11 NOTE — Progress Notes (Signed)
 ECMO Sweep Trial   A sweep trial was started at 0757 per Dr. Zenaida.  Vent settings:  FiO2:  60 PEEP:  5  The following trial parameters have been discussed:  pH > 7.35-7.45,pO2 > 80, SpO2 > 88  ABG drawn: pH 7.41, CO2 34.2, pO2 59, Bicarb 21.8, Sat 91% Vent settings changed to FiO2 80%, PEEP 8, RR 22. Sweep trial continued  ABG drawn: pH 7.43, CO2 32.2, pO2 129, Bicarb 21.9, Sat 99% Per Dr. Zenaida and Dr. Gretta proceeded with flow trial at 1000. Flows dropped to 1LPM until 1005. ABG: pH7.46, CO2 31.1, PO2 238, Bicarb 22.4, Sat 100%, BP 103/63(76), PAP 34/21(26), CVP 16  Sweep/Flow trial passed, and patient was decannulated at 1121.     ABG    Latest Ref Rng & Units 02/11/2024    9:19 AM 02/11/2024   10:11 AM  ABG - Last 2 Results  PH, Arterial 7.35 - 7.45 7.439  7.465   PCO2 arterial 32 - 48 mmHg 32.2  31.1   PO2, Arterial 83 - 108 mmHg 129  238   Bicarbonate 20.0 - 28.0 mmol/L 21.9  22.4   Acid-base deficit 0.0 - 2.0 mmol/L 2.0  1.0   O2 Saturation % 99  100

## 2024-02-11 NOTE — Anesthesia Postprocedure Evaluation (Signed)
 Anesthesia Post Note  Patient: Tracy Baker  Procedure(s) Performed: REDO STERNOTOMY REPLACEMENT, MITRAL VALVE WITH MITRIS RESILIA MITRAL VALVE 27mm ECHOCARDIOGRAM, TRANSESOPHAGEAL, INTRAOPERATIVE     Patient location during evaluation: SICU Anesthesia Type: General Level of consciousness: sedated Pain management: pain level controlled Vital Signs Assessment: post-procedure vital signs reviewed and stable Respiratory status: patient remains intubated per anesthesia plan Cardiovascular status: stable Postop Assessment: no apparent nausea or vomiting Anesthetic complications: no   No notable events documented.  Last Vitals:  Vitals:   02/11/24 1600 02/11/24 1615  BP:    Pulse:    Resp: (!) 22 (!) 22  Temp: 36.8 C 36.8 C  SpO2: 99%     Last Pain:  Vitals:   02/11/24 1600  TempSrc: Core  PainSc:                  Tracy Baker

## 2024-02-11 NOTE — Progress Notes (Signed)
   [address] [place] [employer] [address]             [phone]   Intubated, sedated  BP (!) 102/57   Pulse 90   Temp 98.2 F (36.8 C)   Resp (!) 22   Ht 5' 4 (1.626 m)   Wt 82.3 kg   SpO2 99%   BMI 31.14 kg/m  34/21 SVO2 56, CI 1.3 (CI has been consistently low with this Swan) Echo showed good LV and RV function, no effusion, MV gradient of 3  PRVC 22/7%/8 PEEP 7.34/39/183/23/-4 Dobutamine  5, norepi 3, vasopressin  off   Intake/Output Summary (Last 24 hours) at 02/11/2024 1702 Last data filed at 02/11/2024 1600 Gross per 24 hour  Intake 2337.88 ml  Output 2295 ml  Net 42.88 ml   590 ml of UP so far today off Lasix   Elspeth C. Kerrin, MD [facility] [phone]

## 2024-02-11 NOTE — Clinical Note (Incomplete)
{***   HELP TEXT ***  This SmartLink requires parameters. It will display a table listing the base names of the components followed by the results arranged by the time the values were taken. The LabRcnt SmartLink accepts a list of result component base names separated by commas. The base names (i.e. MCH, MCV, HCT) are abbreviations for a result. For example, Red Blood Cell Count might have a base name of RBC.  This allows the user to request specific information by giving the SmartLink appropriate input.   Example:  .LabRcnt[MCH:3,MCV:*,HCT  - To indicate the NUMBER OF RESULTS for each component, type the component name followed by a colon and then the number of results.  (Component name:4).  - For ALL RESULTS for that particular component, type a star in place of the number (Component name:*).  - To obtain THE LAST RESULT for a particular component, type the component base name without the colon, number, or star. Ex: .LabRcnt[RBC  - The Amount of LOOKBACK TIME for the SmartLink to use is set for the facility in the [facility]. This setting is currently equal to 168 hours   The above example would display a table for all components whose base name matches '[facility]', 'MCV', or 'HCT'. The first row would contain the last three results of each component that has a base name of [facility], the second would contain all the entered results for components with MCV as the base name, and the third would contain the last entered result for all components with a base name of HCT.}  Recent Labs    02/09/24 1310 02/10/24 0511 02/11/24 0509  LDH 489* 551* 579*

## 2024-02-11 NOTE — Progress Notes (Signed)
 Advanced Heart Failure Rounding Note  Cardiologist: Lonni Cash, MD  Chief Complaint: S/p MV Repair / MV endocarditis  Patient Profile   Surah Pelley is a 68 y.o. female with hx of recent nephrolithiasis s/p ureteral stent and lithotripsy at [facility] 10/10 with stent removal. 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 Post-op course c/b post cardiotomy shock, junctional bradycardia, afib w/ RVR and HAP.   Subjective:    RHC 12/2 (on 0.125 of Milrinone ): RA 4, PA 42/13 (22), PCW 12 with Vwaves to 20, CO/CI TD 3.37/2.03, PAPi 7.5  12/5 S/P Redo MVR - Intra Op given  4UPRBCs, 2UPLTs, 2FFP, 1Cyro  12/6 Protek Duo cannulation  Urine output picking up, still net positive. Slow rise in creatinine and dilute urine encouraging that this is not significant ATN.  Sweep and hemodynamic weaning trial this morning. No albumin  overnight.     Objective:    Weight Range: 82.3 kg Body mass index is 31.14 kg/m.   Vital Signs:   Temp:  [95.7 F (35.4 C)-99 F (37.2 C)] 97.9 F (36.6 C) (12/08 0945) Pulse Rate:  [76-81] 80 (12/08 0945) Resp:  [0-27] 0 (12/08 0945) BP: (95-142)/(52-67) 100/60 (12/08 0900) SpO2:  [90 %-100 %] 100 % (12/08 0945) Arterial Line BP: (81-336)/(41-332) 123/60 (12/08 0945) FiO2 (%):  [40 %-60 %] 60 % (12/08 0734) Weight:  [82.3 kg] 82.3 kg (12/08 0500) Last BM Date : 02/10/24  Weight change: Filed Weights   02/09/24 0500 02/10/24 0500 02/11/24 0500  Weight: 76.7 kg 78 kg 82.3 kg   Intake/Output:  Intake/Output Summary (Last 24 hours) at 02/11/2024 0952 Last data filed at 02/11/2024 0900 Gross per 24 hour  Intake 2924.44 ml  Output 2395 ml  Net 529.44 ml    Physical Exam  GENERAL: Intubated PULM:  Ventilated breath sounds CARDIAC:  JVP: mildly elevated         Regular rate with regular rhythm. Systolic murmur. Trace whole body edema. Cannula site with moderate oozing ABDOMEN: Soft, non-tender,  non-distended. NEUROLOGIC: Sedated    Telemetry   DDD paced rhythm, now at 80. Junctional rhythm in the 30s underlying  Labs    CBC Recent Labs    02/10/24 1709 02/10/24 1711 02/10/24 2359 02/11/24 0509  WBC 5.0  --   --  5.7  HGB 9.9*   < > 9.9* 10.2*  9.9*  HCT 30.2*   < > 29.0* 31.2*  29.0*  MCV 92.1  --   --  93.4  PLT 63*  --   --  52*   < > = values in this interval not displayed.   Basic Metabolic Panel Recent Labs    87/92/74 0511 02/10/24 0513 02/10/24 1710 02/10/24 1711 02/10/24 2359 02/11/24 0509  NA 141  142   < > 139   < > 141 139  140  K 3.9  3.9   < > 4.2   < > 4.3 4.7  4.4  CL 105  104   < > 104  --   --  105  CO2 24  23   < > 23  --   --  22  GLUCOSE 121*  122*   < > 107*  --   --  107*  BUN 26*  24*   < > 29*  --   --  34*  CREATININE 2.03*  2.08*   < > 2.26*  --   --  2.50*  CALCIUM   7.7*  8.0*   < > 7.4*  --   --  7.7*  MG 2.5*  --  2.3  --   --  2.4  PHOS 5.9*  5.9*  --   --   --   --   --    < > = values in this interval not displayed.   Liver Function Tests Recent Labs    02/10/24 1710 02/11/24 0509  AST 56* 52*  ALT 5 <5  ALKPHOS 83 86  BILITOT 1.1 1.4*  PROT 4.7* 4.6*  ALBUMIN  3.0* 2.7*    Medications:    Scheduled Medications:  Chlorhexidine  Gluconate Cloth  6 each Topical Daily   insulin  aspart  0-9 Units Subcutaneous Q4H   metoCLOPramide  (REGLAN ) injection  5 mg Intravenous Q8H   mouth rinse  15 mL Mouth Rinse Q2H   pantoprazole  (PROTONIX ) IV  40 mg Intravenous QHS   polyethylene glycol  17 g Per Tube Daily   senna-docusate  2 tablet Per Tube QHS   sodium chloride  flush  10-40 mL Intracatheter Q12H   sodium chloride  flush  3 mL Intravenous Q12H   vancomycin  variable dose per unstable renal function (pharmacist dosing)   Does not apply See admin instructions    Infusions:  albumin  human Stopped (02/10/24 1900)   DOBUTamine  5 mcg/kg/min (02/11/24 0900)   fentaNYL  infusion INTRAVENOUS 200 mcg/hr  (02/11/24 0900)   fluconazole  (DIFLUCAN ) IV 400 mg (02/11/24 0933)   meropenem  (MERREM ) IV Stopped (02/11/24 9378)   midazolam  4 mg/hr (02/11/24 0900)   norepinephrine  (LEVOPHED ) Adult infusion 4 mcg/min (02/11/24 0900)   vasopressin  0.02 Units/min (02/11/24 0900)    PRN Medications: albumin  human, fentaNYL , midazolam , mouth rinse, sodium chloride  flush, sodium chloride  flush  Assessment/Plan   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 Kerrin for Candida endocarditis. Pre-op L/RHC 01/04/24 with no CAD and normal L/R heart pressures.  - Failed initial Milrinone  wean. Eventually weaned off, unfortunately restarted 12/1 for low output.  - TEE 11/18  with severe mitral regurgitation of repaired valve - S/P Redo MVR complicated by rapidly worsening RV failure -Protek Cannulation 12/6 -Minimal chemical support, Maps 80s with good pulse pressure - Flow at 1.7 this morning - Sweep trial today - Wean to 1L to ensure tolerating afterwards - Holding anticoagulation given suspected decannulation - Stop IV lasix  today, will need to restart bolus dosing following - Abx with vancomycin , meropenem  - Continue support with IV DBA at 5, levo at 4, vaso at 0.03  Acute hypoxic respiratory failure: Suspect component of infection/aspiration given unilateral disease.  - Continue vancomycin /meropenem  - Bronch with GNR, narrow abx as able  Coagulopathy/thrombocytopenia/ABLA anemia:  - Thrombocytopenia present, but stable around 50 - INR now back to normal, PTT not significantly elevated, no DIC - Hgb goal >8 - Plt goal >50  Sick sinus - Off amiodarone  - Now with junctional rhythm in the 30s underneath - Continue DDD at 80 - suspect due to underlying swelling  Afib - Post operative - No OAC, in afib las on 12/6  AKI on CKD 3b -  Scr baseline 1.4-1.5 -Diuresis yesterday, urine output picked up in the evening, still net posiitve - Hold diuretics currently, reassess later today  Protein-calorie malnutrition - Needs nutrition when able  Epistaxis  - s/p bilateral nasal packing per ENT 12/1    - Off AC  Length of stay: 40  [name], MD  02/11/24, 9:52 AM  CRITICAL CARE Performed by: [name]   Total critical care time: 50 minutes  Critical care time was exclusive of separately billable procedures and treating other patients.  Critical care was necessary to treat or prevent imminent or life-threatening deterioration.  Critical care was time spent personally by me on the following activities: development of treatment plan with patient and/or surrogate as well as nursing, discussions with consultants, evaluation of patient's response to treatment, examination of patient, obtaining history from patient or surrogate, ordering and performing treatments and interventions, ordering and review of laboratory studies, ordering and review of radiographic studies, pulse oximetry and re-evaluation of patient's condition.

## 2024-02-11 NOTE — Procedures (Signed)
  Patient tolerated sweep trial since 8:30 this AM. Flows turned down to 1L with no change in hemodynamics. Echocardiogram showed normal RV function at flows of 1.6L.   The patient's neck was prepped and draped in sterile fashion. The cannula was prepped with CHG and the sutures were removed. 2 mattress sutures were placed around the cannula site. The circuit was clamped at 11:21. During valsalva, the Protek Cannula was removed in one smooth motion with minimal blood loss other than blood in the circuit. The sutures were tightened and manual pressure was held for 10 minutes. No change in arterial blood pressure.   Site dressed appropriately, no bleeding complications.    CCT time 30 minutes

## 2024-02-11 NOTE — Progress Notes (Signed)
 Orthopedic Tech Progress Note Patient Details:  Tracy Baker [birth year] [id number]  Ortho Devices Type of Ortho Device: Radio broadcast assistant Ortho Device/Splint Location: BLE Ortho Device/Splint Interventions: Ordered, Application, Adjustment   Post Interventions Patient Tolerated: Well Instructions Provided: Care of device  Tracy Baker 02/11/2024, 10:36 AM

## 2024-02-11 NOTE — Progress Notes (Signed)
 PT Cancellation Note  Patient Details Name: Tracy Baker MRN: [id number] DOB: 1955-10-28   Cancelled Treatment:    Reason Eval/Treat Not Completed: Medical issues which prohibited therapy. Pt remains on ECMO currently, with possible plan for decannulation this PM per RN. PT will follow up for evaluation when pt is more medically stable and better able to participate in evaluation.   [name] 02/11/2024, 8:37 AM

## 2024-02-11 NOTE — Progress Notes (Signed)
 2 Days Post-Op Procedure(s) (LRB): ECMO CANNULATION (N/A) Subjective: Intubated and sedated  Objective: Vital signs in last 24 hours: Temp:  [95.7 F (35.4 C)-99 F (37.2 C)] 98.1 F (36.7 C) (12/08 0700) Pulse Rate:  [71-197] 80 (12/08 0700) Cardiac Rhythm: Ventricular paced (12/08 0400) Resp:  [0-27] 16 (12/08 0700) BP: (95-142)/(52-67) 111/58 (12/08 0600) SpO2:  [87 %-100 %] 97 % (12/08 0734) Arterial Line BP: (63-336)/(41-332) 112/64 (12/08 0700) FiO2 (%):  [40 %-60 %] 60 % (12/08 0734) Weight:  [82.3 kg] 82.3 kg (12/08 0500)  Hemodynamic parameters for last 24 hours: PAP: (32-49)/(21-36) 38/26 CVP:  [11 mmHg-24 mmHg] 15 mmHg  Intake/Output from previous day: 12/07 0701 - 12/08 0700 In: 3248.6 [I.V.:1420.3; Blood:997; NG/GT:180; IV Piggyback:651.2] Out: 2145 [Urine:1555; Emesis/NG output:350; Chest Tube:240] Intake/Output this shift: No intake/output data recorded.  General appearance: intubated Neurologic: sedated Heart: regular rate and rhythm and paced Lungs: crackles right upper Abdomen: normal findings: soft Extremities: edema 2+  Lab Results: Recent Labs    02/10/24 1709 02/10/24 1711 02/10/24 2359 02/11/24 0509  WBC 5.0  --   --  5.7  HGB 9.9*   < > 9.9* 10.2*  9.9*  HCT 30.2*   < > 29.0* 31.2*  29.0*  PLT 63*  --   --  52*   < > = values in this interval not displayed.   BMET:  Recent Labs    02/10/24 1710 02/10/24 1711 02/10/24 2359 02/11/24 0509  NA 139   < > 141 139  140  K 4.2   < > 4.3 4.7  4.4  CL 104  --   --  105  CO2 23  --   --  22  GLUCOSE 107*  --   --  107*  BUN 29*  --   --  34*  CREATININE 2.26*  --   --  2.50*  CALCIUM  7.4*  --   --  7.7*   < > = values in this interval not displayed.    PT/INR:  Recent Labs    02/11/24 0509  LABPROT 19.9*  INR 1.6*   ABG    Component Value Date/Time   PHART 7.360 02/11/2024 0509   HCO3 22.9 02/11/2024 0509   TCO2 24 02/11/2024 0509   ACIDBASEDEF 2.0 02/11/2024 0509    O2SAT 56.9 02/11/2024 0516   CBG (last 3)  Recent Labs    02/11/24 0507 02/11/24 0608 02/11/24 0657  GLUCAP 100* 59* 111*    Assessment/Plan: S/P Procedure(s) (LRB): ECMO CANNULATION (N/A) POD # 3 NEURO- sedated for VV ECMO  Consider stopping Versed  CV- in a junctional rhythm, will A pace with a long PR  For now DDD  Dobutamine  5, norepi 4, vasopressin  0.04  PA 33/24, SvO2 57  For trial ECMO wean this AM  Will need anticoagulation at some point, but not ready for now RESP- VDRF, CXR shows RUL opacity  Bronch yesterday shows GNR  Vent per CCM ID- Afebrile and normal WBC, but probable RUL pneumonia  On Vanco and meropenem   Diflucan  for fungal endocarditis RENAL- creatinine up fro 2.26 to 2.5  Making urine on Lasix  drip   Agree with holding Lasix  for now  Monitor, should recover GI- relatively high NG output  Protein calorie malnutrition- need to address ASAP  Agree with Dr. Gretta on trying to avoid TNA ENDO- CBG low -  stop insulin  drip Thrombocytopenia- likely consumptive  PLT 52K., monitor, transfuse as needed     LOS: 40 days  Tracy Baker 02/11/2024

## 2024-02-11 NOTE — CV Procedure (Signed)
 ECMO NOTE:   Indication: Post operative RV failure   Initial cannulation date: 12/6   ECMO type: V-PA ECMO   Dual lumen inflow/return cannula:   1) 31 Fr Protek Duo catheter placed    ECMO events:   - Initial cannulation 12/6 - Cannula adjustment x2 on 12/6 with improvement in flows - Reasonable urine output overnight, creatinine slowly climbing - Came down on flows this morning due to chugging - Sweep gas trial this AM given R lung PNA - If tolerating, then plan to decrease flows to 1L and hopefully decannulate     Daily data:   Flow 1.7L RPM 2460 Sweep  1L FiO2 blender at 60%    Plan:  Continue ECMO support Worsening R lung opacities, growing GNR from BAL yesterday, on meropenem  Dobutamine  5, levo at 4, vaso at 0.03 Plan to wean support and hopefully decannulate today     Discussed in multidisciplinary fashion on ECMO rounds with CCM, Cardiology, ECMO coordinator/specialist, RT, PharmD and nursing staff all present.

## 2024-02-11 NOTE — TOC Progression Note (Signed)
 Transition of Care [facility]) - Progression Note    Patient Details  Name: Tracy Baker MRN: [id number] Date of Birth: May 04, 1955  Transition of Care [facility]) CM/SW Contact  Arlana JINNY Nicholaus [place] Phone Number: [phone] 02/11/2024, 11:27 AM  Clinical Narrative:   HF CSW will fax patient out when she is closer to being medically ready for dc.   [facility] CSW/CM will continue to follow and monitor for dc readiness.     Expected Discharge Plan: Skilled Nursing Facility Barriers to Discharge: Continued Medical Work up               Expected Discharge Plan and Services   Discharge Planning Services: CM Consult Post Acute Care Choice: Skilled Nursing Facility Living arrangements for the past 2 months: Apartment                                       Social Drivers of Health (SDOH) Interventions SDOH Screenings   Food Insecurity: No Food Insecurity (12/29/2023)  Housing: Low Risk  (12/29/2023)  Transportation Needs: No Transportation Needs (12/29/2023)  Utilities: Not At Risk (12/29/2023)  Alcohol Screen: Low Risk  (06/08/2022)  Depression (PHQ2-9): Low Risk  (11/08/2023)  Social Connections: Socially Isolated (12/29/2023)  Tobacco Use: High Risk (02/08/2024)    Readmission Risk Interventions     No data to display

## 2024-02-11 NOTE — Progress Notes (Signed)
 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 Kerrin. PCCM consulted for ICU post-op vent management.  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 Kerrin 12/6: Overnight patient started requiring more vasopressor support, early morning she was on dobutamine  5, milrinone  0.375, epinephrine  at 10, vasopressin  0.04 and norepinephrine  34 mics, she is not making urine.  Cannulated with Protek duo for RV support,   Interim History / Subjective:  Overnight had some chugging.  Yesterday did not tolerate enteral meds due to high NGT output.  Fentanyl  200mcg Milrinone  off, DBA 5 NE 4mcg  Vaso 0.04 Lasix  20mg / h Insulin  Versed  4mg /h   Objective    Blood pressure (!) 111/58, pulse 80, temperature 98.1 F (36.7 C), resp. rate 16, height 5' 4 (1.626 m), weight 82.3 kg, SpO2 97%. PAP: (32-65)/(6-36) 38/26 CVP:  [11 mmHg-24 mmHg] 15 mmHg  Vent Mode: SIMV/PC/PS FiO2 (%):  [40 %-60 %] 60 % Set Rate:  [16 bmp] 16 bmp Vt Set:  [440 mL] 440 mL PEEP:  [5 cmH20] 5 cmH20 Pressure Support:  [10 cmH20] 10 cmH20 Plateau Pressure:  [18 cmH20-24 cmH20] 18 cmH20   Intake/Output Summary (Last 24 hours) at 02/11/2024 0735 Last data filed at 02/11/2024 9346 Gross per 24 hour  Intake  3248.55 ml  Output 2145 ml  Net 1103.55 ml   Filed Weights   02/09/24 0500 02/10/24 0500 02/11/24 0500  Weight: 76.7 kg 78 kg 82.3 kg     Physical exam: General: critically ill appearing woman lying in bed in AND HEENT: [place], ETT in place Neck: RIJ ECMO cannula with mild bleeding Neuro: RASS -5 Chest: breathing comfortably on MV, rhales on the R anterolateral, Pplat 28 on PEEP 8 Heart: S1S2, paced rhythm. Abdomen: soft, NT, hypoactive bowel sounds  UOP +1.1L, net +2.2L for admission NGT output 350cc  Vent PRVC 22/440/8/80%  ECMO 2430 RPM? 1.67L P ven -15 Delta P 6  7.36/40/116/23 on PEEP 8, FiO2 60%, 60% blender on EMO BUN 34 Cr 2.5 LDH 579 LA 1.3 H/H 10.2/31.2 Platelets 52 INR 1.6  CXR personally reviewed> dense RUL infiltrate, RIJ ECMO cannula pointing into L PA   Patient Lines/Drains/Airways Status     Active Line/Drains/Airways     Name Placement date Placement time Site Days   Arterial Line 02/08/24 Right Radial 02/08/24  0700  Radial  3   PICC Double Lumen 01/15/24 Right Basilic 35 cm 0 cm 01/15/24  8384  -- 27   ECMO Dual Lumen Cannula 02/09/24  1135  -- 2   NG/OG Vented/Dual Lumen 16 Fr. Oral 02/08/24  1333  Oral  3   Urethral Catheter [name], RN Latex;Straight-tip;Temperature probe 14 Fr. 02/08/24  0800  Latex;Straight-tip;Temperature probe  3   Y Chest Tube 1, 2, and 3 1 Right Pleural 28 Fr. 2  Left Pleural 28 Fr. 3 Anterior Mediastinal 36 Fr. 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, with persistent  severe mitral regurgitation and status post redo sternotomy and bioprosthetic MVR Previous candidemia Acute RV failure with cardiogenic shock status post Protek duo VV ECMO cannula on 12/6 -con't weaning ECMO flows, hopeful we can wean off protek support soon -con't iNO, wean off after ECMO -con't DBA per AHF -keep swan for hemodynamic monitoring -diflucan  -aspirin   Shock, mixed cardiogenic and distributive -NE, vaso to maintain MAP >65; wean off as able -con't DBA 5 -cont't iNO -weaning trials of VV EMO  Acute respiratory failure with hypoxia RUL pneumonia -trial of weaning sweep off before weaning ECMO flows; vent adjustments made after first ABG on sweep trial. -LTVV; changed to [facility] -PAD protocol; once off ECMO will try to wean from versed  to alternative med -VAP prevention protocol -con't meropenem , follow culture from BAL 12/7 -con't full vent support; no vent weaning yet  AKI on CKD stage IIIa, worsening -strict I/O -renally dose meds, avoid nephrotoxic meds -hold lasix  today  -con't foley  Hypoglycemia H/o prediabetes, A1c 6 -off insulin  gtt -hopefully can start TF eventually -hold on cortrak with thrombocytopenia  Ileus; no issues with constipation -start reglan   Acute blood loss anemia status post multiple PRBC transfusion Thrombocytopenia due to critical illness status post 2 unit platelets Coagulopathy, probably from liver congestion requiring FFP transfusion -previously given vit K -hold on AC with thrombocytopenia  Severe protein calorie malnutrition -reglan ; will trial TF if NGT output slows today  Hyperbilirubinemia; unclear etiology -trend  Labs:  CBC: Recent Labs  Lab 02/09/24 1721 02/09/24 1724 02/09/24 2356 02/09/24 2358 02/10/24 0511 02/10/24 0513 02/10/24 0700 02/10/24 0745 02/10/24 1709 02/10/24 1711 02/10/24 1810 02/10/24 1852 02/10/24 2359 02/11/24 0509  WBC 6.9  --  7.3  --  7.2  --   --   --  5.0  --   --   --   --  5.7   HGB 9.3*   < > 10.7*   < > 10.0*   < >  --    < > 9.9* 9.9* 9.5* 9.5* 9.9* 10.2*  9.9*  HCT 27.0*   < > 30.6*   < > 29.2*   < >  --    < > 30.2* 29.0* 28.0* 28.0* 29.0* 31.2*  29.0*  MCV 91.8  --  89.2  --  89.0  --   --   --  92.1  --   --   --   --  93.4  PLT 99*  --  84*  --  77*  --  76*  --  63*  --   --   --   --  52*   < > = values in this interval not displayed.    Basic Metabolic Panel: Recent Labs  Lab 02/09/24 1310 02/09/24 1313 02/09/24 1721 02/09/24 1724 02/10/24  9488 02/10/24 0513 02/10/24 1239 02/10/24 1710 02/10/24 1711 02/10/24 1810 02/10/24 1852 02/10/24 2359 02/11/24 0509  NA 140   < > 140   < > 141  142   < > 139 139 140 140 141 141 139  140  K 4.3   < > 4.2   < > 3.9  3.9   < > 4.5 4.2 4.3 4.2 4.2 4.3 4.7  4.4  CL 106  --  104  --  105  104  --  105 104  --   --   --   --  105  CO2 18*  --  23  --  24  23  --  24 23  --   --   --   --  22  GLUCOSE 97  --  119*  --  121*  122*  --  109* 107*  --   --   --   --  107*  BUN 22  --  23  --  26*  24*  --  27* 29*  --   --   --   --  34*  CREATININE 1.70*  --  1.75*  --  2.03*  2.08*  --  2.16* 2.26*  --   --   --   --  2.50*  CALCIUM  7.5*  --  7.7*  --  7.7*  8.0*  --  7.3* 7.4*  --   --   --   --  7.7*  MG 2.3  --  2.5*  --  2.5*  --   --  2.3  --   --   --   --  2.4  PHOS  --   --   --   --  5.9*  5.9*  --   --   --   --   --   --   --   --    < > = values in this interval not displayed.   GFR: Estimated Creatinine Clearance: 22.3 mL/min (A) (by C-G formula based on SCr of 2.5 mg/dL (H)). Recent Labs  Lab 02/09/24 2356 02/10/24 0511 02/10/24 0518 02/10/24 0846 02/10/24 1709 02/10/24 1853 02/11/24 0509 02/11/24 0515  WBC 7.3 7.2  --   --  5.0  --  5.7  --   LATICACIDVEN  --   --  1.0 1.0  --  1.1  --  1.3    Liver Function Tests: Recent Labs  Lab 02/09/24 0203 02/09/24 1310 02/10/24 0511 02/10/24 1710 02/11/24 0509  AST 105* 99* 76* 56* 52*  ALT 15 12 6 5  <5  ALKPHOS 81 56 67  83 86  BILITOT 1.6* 0.9 1.2 1.1 1.4*  PROT 5.1* 3.6* 4.4* 4.7* 4.6*  ALBUMIN  3.4* 2.5* 3.1*  3.2* 3.0* 2.7*   ABG    Component Value Date/Time   PHART 7.360 02/11/2024 0509   PCO2ART 40.4 02/11/2024 0509   PO2ART 116 (H) 02/11/2024 0509   HCO3 22.9 02/11/2024 0509   TCO2 24 02/11/2024 0509   ACIDBASEDEF 2.0 02/11/2024 0509   O2SAT 56.9 02/11/2024 0516    Coagulation Profile: Recent Labs  Lab 02/09/24 1310 02/10/24 0511 02/10/24 0700 02/10/24 1239 02/11/24 0509  INR 3.9* 4.6* 5.3* 3.1* 1.6*      CBG: Recent Labs  Lab 02/11/24 0254 02/11/24 0404 02/11/24 0507 02/11/24 0608 02/11/24 0657  GLUCAP 98 86 100* 59* 111*   This patient is critically ill with multiple organ system failure which  requires frequent high complexity decision making, assessment, support, evaluation, and titration of therapies. This was completed through the application of advanced monitoring technologies and extensive interpretation of multiple databases. During this encounter critical care time was devoted to patient care services described in this note for 46 minutes.  Leita SHAUNNA Gaskins, DO 02/11/24 9:08 AM [facility]  For contact information, see Amion. If no response to pager, please call PCCM consult pager. After hours, 7PM- 7AM, please call Elink.

## 2024-02-11 NOTE — Progress Notes (Signed)
 OT Cancellation Note  Patient Details Name: Tracy Baker MRN: [id number] DOB: 29-Mar-1955   Cancelled Treatment:    Reason Eval/Treat Not Completed: Medical issues which prohibited therapy (pt on ECMO, with possible plan for decannulation this PM, per RN hold today. Will follow up for OT evaluation next date as appropriate.)  Delora Gravatt K, OTD, OTR/L [employer] Preferred [facility] [phone]   Laneta MARLA Pereyra 02/11/2024, 8:32 AM

## 2024-02-11 NOTE — Progress Notes (Signed)
 Nutrition Follow-up  DOCUMENTATION CODES:   Severe malnutrition in context of acute illness/injury  INTERVENTION:  If pt unable to be extubated and put on diet within the next 24-48 hr, recommend nutrition support  Tube feeding via enteral nutrition if pt no longer experiencing malabsorption Initiate tube feeding via OG: begin at 20 ml/h and increase 10 ml q8h until goal rate is reached Vital 1.5 at 50 ml/h (1200 ml per day) Provides 1800 kcal, 81 gm protein, 916 ml free water  daily  Parenteral Nutrition if pt continues to have malabsorption issues - titrate to meet estimated nutrition needs.  Kcal:  1750-1950 kcals Protein:  80-95 g  Monitor for readiness to initiate nutrition support or change in status  NUTRITION DIAGNOSIS:   Severe Malnutrition related to acute illness as evidenced by moderate muscle depletion, mild muscle depletion, energy intake < or equal to 50% for > or equal to 5 days. Remains applicable  GOAL:   Patient will meet greater than or equal to 90% of their needs Currently unmet  MONITOR:   PO intake, Supplement acceptance, Labs, Weight trends  REASON FOR ASSESSMENT:   Consult Poor PO  ASSESSMENT:   68 yo female admitted with hx of Calbicans candidemia, being treated at [facility] in [place] but left AMA and presents with MV endocarditis requiring MVR, post-op course complicated by shock post-op, junctional bradycardia, afib with RVR and pneumonia. PMH includes nephrolithiasis s/p ureteral stent and lithotripsy  10/29 Admitted 11/06 MV repair 11/18 TEE: EF 60-65%, mild RV dysfunction, severe MR 11/21 calorie count started 11/24 calorie count ended - insufficient intake; txr out of ICU 12/01 N/V noted; refusing antiemetic 12/02 RHC: sig reduced cardiac index likely d/t MR; normal filling pressures noted 12/05 repeat MVR  12/06 cannulated w/ Protek duo needing RV support 12/08 decannulated  Pt undergoing decannulation at time of assessment. Pt  successfully decannulated but remains on ventilator support. RN reports care team wants to hold off on initiation of enteral feeds due to malabsorption issues with medications. Recommend initiating nutrition support via EN or PN within 24-48 h if pt is not able to be extubated and placed on a PO diet. Will continue to monitor status and left recommendations for interventions if needed.  Patient is currently intubated on ventilator support MV: 9.7 L/min Temp (24hrs), Avg:98 F (36.7 C), Min:95.7 F (35.4 C), Max:99 F (37.2 C) MAP (aline): 75-83 mmHg   Admit weight: 67.3 kg  Current weight: 82.3 kg    Intake/Output Summary (Last 24 hours) at 02/11/2024 0840 Last data filed at 02/11/2024 0800 Gross per 24 hour  Intake 3281.04 ml  Output 2295 ml  Net 986.04 ml   Net IO Since Admission: 2,191.4 mL [02/11/24 0840]  Drains/Lines: PICC double lumen R basilic  A Line R radial Pulmonary Artery Catheter L Introducer doubel lumen L internal jugular  Urethral Catheter UOP: 1555 ml x 24 h Y chest tube: 240 ml x 24 h OG gastric per xray: 350 ml x 24 h   Nutritionally Relevant Medications: Scheduled Meds:  insulin  aspart  0-9 Units Subcutaneous Q4H   metoCLOPramide  (REGLAN ) injection  5 mg Intravenous Q8H   pantoprazole  (PROTONIX ) IV  40 mg Intravenous QHS   polyethylene glycol  17 g Per Tube Daily   senna-docusate  2 tablet Per Tube QHS   sodium chloride  flush  10-40 mL Intracatheter Q12H   sodium chloride  flush  3 mL Intravenous Q12H   Continuous Infusions:  albumin  human 999 mL/hr at 02/10/24 1800  DOBUTamine  5 mcg/kg/min (02/11/24 0800)   fentaNYL  infusion INTRAVENOUS 200 mcg/hr (02/11/24 0800)   fluconazole  (DIFLUCAN ) IV Stopped (02/10/24 1315)   meropenem  (MERREM ) IV Stopped (02/11/24 9378)   midazolam  4 mg/hr (02/11/24 0800)   norepinephrine  (LEVOPHED ) Adult infusion 4 mcg/min (02/11/24 0800)   vasopressin  0.03 Units/min (02/11/24 0800)   PRN Meds:.albumin  human,  fentaNYL , midazolam ,  sodium chloride  flush, sodium chloride  flush  Labs Reviewed: BUN 34/Cr 2.50 Corrected Calcium : 8.74 (low) Phos 5.9 (high) Mag 2.4<--2.3<--2.5 (now WNL) CBG ranges from 59-111 mg/dL over the last 24 hours  Diet Order:   Diet Order             Diet NPO time specified  Diet effective now                   EDUCATION NEEDS:   Education needs have been addressed  Skin:  Skin Assessment: Skin Integrity Issues: Skin Integrity Issues:: Incisions Incisions: chest (closed)  Last BM:  12/7  Height:   Ht Readings from Last 1 Encounters:  01/10/24 5' 4 (1.626 m)    Weight:   Wt Readings from Last 1 Encounters:  02/11/24 82.3 kg    Ideal Body Weight:  54.5 kg  BMI:  Body mass index is 31.14 kg/m.  Estimated Nutritional Needs:   Kcal:  1750-1950 kcals  Protein:  80-95 g  Fluid:  1.8 L    Josette Glance, MS, RDN, LDN Clinical Dietitian I Please reach out via secure chat

## 2024-02-11 NOTE — Progress Notes (Signed)
 Orthopedic Tech Progress Note Patient Details:  Nesiah Jump 29-Dec-1955 [id number] Arrived to apply UB but MD asked to return later while she performs an A-Line insertion. Patient ID: Tonnette Zwiebel, female   DOB: 1956/02/09, 68 y.o.   MRN: [id number]  Giovanni LITTIE Lukes 02/11/2024, 9:47 AM

## 2024-02-11 NOTE — Procedures (Signed)
 Arterial Catheter Insertion Procedure Note  Tracy Baker  [id number]  1955/08/23  Date:02/11/24  Time:9:59 AM    Provider Performing: Leita SHAUNNA Gaskins    Procedure: Insertion of Arterial Line (63379) with US  guidance (23062)   Indication(s) Blood pressure monitoring and/or need for frequent ABGs  Consent Risks of the procedure as well as the alternatives and risks of each were explained to the patient and/or caregiver.  Consent for the procedure was obtained and is signed in the bedside chart  Anesthesia 1% lidocaine    Time Out Verified patient identification, verified procedure, site/side was marked, verified correct patient position, special equipment/implants available, medications/allergies/relevant history reviewed, required imaging and test results available.   Sterile Technique Maximal sterile technique including full sterile barrier drape, hand hygiene, sterile gown, sterile gloves, mask, hair covering, sterile ultrasound probe cover (if used).   Procedure Description Area of catheter insertion was cleaned with chlorhexidine  and draped in sterile fashion. With real-time ultrasound guidance an arterial catheter was placed into the left axillary artery.  Appropriate arterial tracings confirmed on monitor.     Complications/Tolerance None; patient tolerated the procedure well.   EBL Minimal   Specimen(s) None  Leita SHAUNNA Gaskins, DO 02/11/24 9:59 AM  Pulmonary & Critical Care  For contact information, see Amion. If no response to pager, please call PCCM consult pager. After hours, 7PM- 7AM, please call Elink.

## 2024-02-12 ENCOUNTER — Ambulatory Visit: Admitting: Thoracic Surgery (Cardiothoracic Vascular Surgery)

## 2024-02-12 ENCOUNTER — Inpatient Hospital Stay (HOSPITAL_COMMUNITY)

## 2024-02-12 DIAGNOSIS — I340 Nonrheumatic mitral (valve) insufficiency: Secondary | ICD-10-CM | POA: Diagnosis not present

## 2024-02-12 DIAGNOSIS — D62 Acute posthemorrhagic anemia: Secondary | ICD-10-CM

## 2024-02-12 DIAGNOSIS — R570 Cardiogenic shock: Secondary | ICD-10-CM | POA: Diagnosis not present

## 2024-02-12 DIAGNOSIS — J9601 Acute respiratory failure with hypoxia: Secondary | ICD-10-CM | POA: Diagnosis not present

## 2024-02-12 DIAGNOSIS — K567 Ileus, unspecified: Secondary | ICD-10-CM

## 2024-02-12 LAB — CBC
HCT: 42 % (ref 36.0–46.0)
HCT: 42.6 % (ref 36.0–46.0)
Hemoglobin: 14.1 g/dL (ref 12.0–15.0)
Hemoglobin: 14.2 g/dL (ref 12.0–15.0)
MCH: 30.7 pg (ref 26.0–34.0)
MCH: 30.5 pg (ref 26.0–34.0)
MCHC: 33.6 g/dL (ref 30.0–36.0)
MCHC: 33.3 g/dL (ref 30.0–36.0)
MCV: 91.5 fL (ref 80.0–100.0)
MCV: 91.6 fL (ref 80.0–100.0)
Platelets: 46 K/uL — ABNORMAL LOW (ref 150–400)
Platelets: 41 K/uL — ABNORMAL LOW (ref 150–400)
RBC: 4.59 MIL/uL (ref 3.87–5.11)
RBC: 4.65 MIL/uL (ref 3.87–5.11)
RDW: 20.5 % — ABNORMAL HIGH (ref 11.5–15.5)
RDW: 20.8 % — ABNORMAL HIGH (ref 11.5–15.5)
WBC: 9 K/uL (ref 4.0–10.5)
WBC: 9.1 K/uL (ref 4.0–10.5)
nRBC: 0 % (ref 0.0–0.2)
nRBC: 0 % (ref 0.0–0.2)

## 2024-02-12 LAB — HEPATIC FUNCTION PANEL
ALT: <5 U/L (ref 0–44)
AST: 26 U/L (ref 15–41)
Albumin: 2.6 g/dL — ABNORMAL LOW (ref 3.5–5.0)
Alkaline Phosphatase: 99 U/L (ref 38–126)
Bilirubin, Direct: 0.3 mg/dL — ABNORMAL HIGH (ref 0.0–0.2)
Indirect Bilirubin: 1.1 mg/dL — ABNORMAL HIGH (ref 0.3–0.9)
Total Bilirubin: 1.4 mg/dL — ABNORMAL HIGH (ref 0.0–1.2)
Total Protein: 4.8 g/dL — ABNORMAL LOW (ref 6.5–8.1)

## 2024-02-12 LAB — BASIC METABOLIC PANEL WITH GFR
Anion gap: 17 — ABNORMAL HIGH (ref 5–15)
Anion gap: 8 (ref 5–15)
BUN: 45 mg/dL — ABNORMAL HIGH (ref 8–23)
BUN: 50 mg/dL — ABNORMAL HIGH (ref 8–23)
CO2: 17 mmol/L — ABNORMAL LOW (ref 22–32)
CO2: 23 mmol/L (ref 22–32)
Calcium: 7.9 mg/dL — ABNORMAL LOW (ref 8.9–10.3)
Calcium: 7.9 mg/dL — ABNORMAL LOW (ref 8.9–10.3)
Chloride: 107 mmol/L (ref 98–111)
Chloride: 107 mmol/L (ref 98–111)
Creatinine, Ser: 2.7 mg/dL — ABNORMAL HIGH (ref 0.44–1.00)
Creatinine, Ser: 2.78 mg/dL — ABNORMAL HIGH (ref 0.44–1.00)
GFR, Estimated: 19 mL/min — ABNORMAL LOW (ref >60)
GFR, Estimated: 18 mL/min — ABNORMAL LOW (ref >60)
Glucose, Bld: 118 mg/dL — ABNORMAL HIGH (ref 70–99)
Glucose, Bld: 134 mg/dL — ABNORMAL HIGH (ref 70–99)
Potassium: 3.9 mmol/L (ref 3.5–5.1)
Potassium: 4.2 mmol/L (ref 3.5–5.1)
Sodium: 141 mmol/L (ref 135–145)
Sodium: 138 mmol/L (ref 135–145)

## 2024-02-12 LAB — POCT I-STAT 7, (LYTES, BLD GAS, ICA,H+H)
Acid-base deficit: 4 mmol/L — ABNORMAL HIGH (ref 0.0–2.0)
Bicarbonate: 21.4 mmol/L (ref 20.0–28.0)
Calcium, Ion: 1.09 mmol/L — ABNORMAL LOW (ref 1.15–1.40)
HCT: 40 % (ref 36.0–46.0)
Hemoglobin: 13.6 g/dL (ref 12.0–15.0)
O2 Saturation: 98 %
Patient temperature: 36.7
Potassium: 3.8 mmol/L (ref 3.5–5.1)
Sodium: 141 mmol/L (ref 135–145)
TCO2: 23 mmol/L (ref 22–32)
pCO2 arterial: 37.4 mmHg (ref 32–48)
pH, Arterial: 7.365 (ref 7.35–7.45)
pO2, Arterial: 110 mmHg — ABNORMAL HIGH (ref 83–108)

## 2024-02-12 LAB — GLUCOSE, CAPILLARY
Glucose-Capillary: 114 mg/dL — ABNORMAL HIGH (ref 70–99)
Glucose-Capillary: 123 mg/dL — ABNORMAL HIGH (ref 70–99)
Glucose-Capillary: 127 mg/dL — ABNORMAL HIGH (ref 70–99)
Glucose-Capillary: 131 mg/dL — ABNORMAL HIGH (ref 70–99)
Glucose-Capillary: 137 mg/dL — ABNORMAL HIGH (ref 70–99)
Glucose-Capillary: 152 mg/dL — ABNORMAL HIGH (ref 70–99)
Glucose-Capillary: 159 mg/dL — ABNORMAL HIGH (ref 70–99)

## 2024-02-12 LAB — ECHOCARDIOGRAM COMPLETE
AV Mean grad: 2 mmHg
AV Peak grad: 2.8 mmHg
Ao pk vel: 0.84 m/s
Area-P 1/2: 4.1 cm2
Height: 64 in
Weight: 2903.02 [oz_av]

## 2024-02-12 LAB — LACTATE DEHYDROGENASE: LDH: 444 U/L — ABNORMAL HIGH (ref 105–235)

## 2024-02-12 LAB — VANCOMYCIN, RANDOM: Vancomycin Rm: 16 ug/mL

## 2024-02-12 LAB — COOXEMETRY PANEL
Carboxyhemoglobin: 1.4 % (ref 0.5–1.5)
Methemoglobin: 0.8 % (ref 0.0–1.5)
O2 Saturation: 63.9 %
Total hemoglobin: 14.5 g/dL (ref 12.0–16.0)

## 2024-02-12 LAB — MAGNESIUM: Magnesium: 2.4 mg/dL (ref 1.7–2.4)

## 2024-02-12 LAB — PROTIME-INR
INR: 1.3 — ABNORMAL HIGH (ref 0.8–1.2)
Prothrombin Time: 16.7 s — ABNORMAL HIGH (ref 11.4–15.2)

## 2024-02-12 LAB — CULTURE, RESPIRATORY W GRAM STAIN

## 2024-02-12 LAB — FIBRINOGEN: Fibrinogen: 438 mg/dL (ref 210–475)

## 2024-02-12 LAB — CG4 I-STAT (LACTIC ACID): Lactic Acid, Venous: 1.2 mmol/L (ref 0.5–1.9)

## 2024-02-12 LAB — PHOSPHORUS: Phosphorus: 6.9 mg/dL — ABNORMAL HIGH (ref 2.5–4.6)

## 2024-02-12 MED ORDER — SODIUM CHLORIDE 3 % IN NEBU
4.0000 mL | INHALATION_SOLUTION | RESPIRATORY_TRACT | Status: AC
  Administered 2024-02-12 – 2024-02-15 (×17): 4 mL via RESPIRATORY_TRACT
  Filled 2024-02-12 (×17): qty 4

## 2024-02-12 MED ORDER — POTASSIUM CHLORIDE 20 MEQ PO PACK
20.0000 meq | PACK | Freq: Once | ORAL | Status: AC
  Administered 2024-02-12: 20 meq
  Filled 2024-02-12: qty 1

## 2024-02-12 MED ORDER — PROPOFOL 1000 MG/100ML IV EMUL: 0.0000 ug/kg/min | INTRAVENOUS | Status: DC

## 2024-02-12 MED ORDER — FUROSEMIDE 10 MG/ML IJ SOLN
10.0000 mg/h | INTRAVENOUS | Status: DC
  Filled 2024-02-12: qty 20

## 2024-02-12 MED ORDER — SODIUM CHLORIDE 0.9% FLUSH
10.0000 mL | Freq: Two times a day (BID) | INTRAVENOUS | Status: DC
  Administered 2024-02-12 – 2024-02-13 (×3): 10 mL
  Administered 2024-02-13: 20 mL
  Administered 2024-02-15 – 2024-02-16 (×4): 10 mL

## 2024-02-12 MED ORDER — VITAL 1.5 CAL PO LIQD
1000.0000 mL | ORAL | Status: DC
  Administered 2024-02-12: 1000 mL

## 2024-02-12 MED ORDER — POLYETHYLENE GLYCOL 3350 17 G PO PACK
17.0000 g | PACK | Freq: Two times a day (BID) | ORAL | Status: DC
  Administered 2024-02-12 (×2): 17 g
  Filled 2024-02-12 (×2): qty 1

## 2024-02-12 MED ORDER — ALBUTEROL SULFATE (2.5 MG/3ML) 0.083% IN NEBU
2.5000 mg | INHALATION_SOLUTION | RESPIRATORY_TRACT | Status: DC | PRN
  Administered 2024-02-13: 2.5 mg via RESPIRATORY_TRACT
  Filled 2024-02-12: qty 3

## 2024-02-12 MED ORDER — VANCOMYCIN HCL IN DEXTROSE 1-5 GM/200ML-% IV SOLN
1000.0000 mg | Freq: Once | INTRAVENOUS | Status: AC
  Administered 2024-02-12: 1000 mg via INTRAVENOUS
  Filled 2024-02-12: qty 200

## 2024-02-12 MED ORDER — FUROSEMIDE 10 MG/ML IJ SOLN
120.0000 mg | Freq: Once | INTRAVENOUS | Status: AC
  Administered 2024-02-12: 120 mg via INTRAVENOUS
  Filled 2024-02-12: qty 10

## 2024-02-12 MED ORDER — SODIUM CHLORIDE 0.9% FLUSH: 10.0000 mL | INTRAVENOUS | Status: DC | PRN

## 2024-02-12 MED ORDER — FUROSEMIDE 10 MG/ML IJ SOLN: 80.0000 mg | Freq: Once | INTRAMUSCULAR | Status: DC

## 2024-02-12 MED ORDER — ALBUTEROL SULFATE (2.5 MG/3ML) 0.083% IN NEBU
INHALATION_SOLUTION | RESPIRATORY_TRACT | Status: AC
  Administered 2024-02-12: 2.5 mg
  Filled 2024-02-12: qty 3

## 2024-02-12 MED ORDER — SODIUM CHLORIDE 0.9 % IV SOLN
2.0000 g | INTRAVENOUS | Status: DC
  Administered 2024-02-12 – 2024-02-17 (×6): 2 g via INTRAVENOUS
  Filled 2024-02-12 (×6): qty 12.5

## 2024-02-12 MED ORDER — PROSOURCE TF20 ENFIT COMPATIBL EN LIQD
60.0000 mL | Freq: Four times a day (QID) | ENTERAL | Status: DC
  Administered 2024-02-12 – 2024-02-15 (×11): 60 mL
  Filled 2024-02-12 (×11): qty 60

## 2024-02-12 NOTE — Progress Notes (Signed)
 3 Days Post-Op Procedure(s) (LRB): ECMO CANNULATION (N/A) Subjective: Intubated, sedated  Objective: Vital signs in last 24 hours: Temp:  [96.6 F (35.9 C)-98.2 F (36.8 C)] 97.5 F (36.4 C) (12/09 1019) Pulse Rate:  [80-91] 89 (12/09 1019) Cardiac Rhythm: A-V Sequential paced (12/09 0800) Resp:  [0-25] 22 (12/09 1019) BP: (102-111)/(57-66) 111/66 (12/09 0710) SpO2:  [95 %-100 %] 96 % (12/09 1019) Arterial Line BP: (81-147)/(59-76) 95/59 (12/09 1019) FiO2 (%):  [70 %-80 %] 70 % (12/09 1019) Weight:  [81.8 kg] 81.8 kg (12/09 0600)  Hemodynamic parameters for last 24 hours: PAP: (29-47)/(15-35) 29/16 CVP:  [14 mmHg-27 mmHg] 15 mmHg CO:  [1.7 L/min-2.7 L/min] 2.2 L/min CI:  [1 L/min/m2-1.6 L/min/m2] 1.3 L/min/m2  Intake/Output from previous day: 12/08 0701 - 12/09 0700 In: 2373 [I.V.:843; Blood:630; NG/GT:433; IV Piggyback:467] Out: 1045 [Urine:905; Emesis/NG output:50; Chest Tube:90] Intake/Output this shift: Total I/O In: 138.2 [I.V.:78.2; NG/GT:60] Out: 85 [Urine:85]  General appearance: intubated Neurologic: sedated Heart: regular rate and rhythm Lungs: rales RUL Abdomen: normal findings: soft, non-tender Wound: dressing in place  Lab Results: Recent Labs    02/11/24 1528 02/11/24 1548 02/12/24 0410 02/12/24 0817  WBC 8.2  --  9.0  --   HGB 14.3   < > 14.1 13.6  HCT 42.1   < > 42.0 40.0  PLT 51*  --  46*  --    < > = values in this interval not displayed.   BMET:  Recent Labs    02/11/24 1528 02/11/24 1548 02/12/24 0410 02/12/24 0817  NA 141   < > 141 141  K 4.2   < > 3.9 3.8  CL 106  --  107  --   CO2 21*  --  17*  --   GLUCOSE 103*  --  118*  --   BUN 36*  --  45*  --   CREATININE 2.70*  --  2.70*  --   CALCIUM  7.8*  --  7.9*  --    < > = values in this interval not displayed.    PT/INR:  Recent Labs    02/12/24 0410  LABPROT 16.7*  INR 1.3*   ABG    Component Value Date/Time   PHART 7.365 02/12/2024 0817   HCO3 21.4 02/12/2024  0817   TCO2 23 02/12/2024 0817   ACIDBASEDEF 4.0 (H) 02/12/2024 0817   O2SAT 98 02/12/2024 0817   CBG (last 3)  Recent Labs    02/11/24 2359 02/12/24 0409 02/12/24 0814  GLUCAP 131* 114* 127*    Assessment/Plan: S/P Procedure(s) (LRB): ECMO CANNULATION (N/A) Remains critically ill  NEURO- sedated, lighten as tolerated CV- weaned off VV ECMO yesterday  Co-ox 64 on dobutamine  at 5 and norepi 1  Rhythm- will A pace with long PR, underlying rhythm in 30s  Continue pacing for now RESP- VDRF, HAP pneumonia on right  GNR on gram stain- on meropenem   Vent per CCM but unlikely to wean yet RENAL- creatinine stable at 2.7  Hopefully has plateaued and will begin to come down  Agree with diuresis today ENDO- CBG well controlled GI- Protein calorie malnutrition tolerating trickle feeds  Advance as tolerated Thrombocytopenia - PLT down slightly at 26K  Monitor Deconditioning- severe   LOS: 41 days    [name] 02/12/2024

## 2024-02-12 NOTE — Progress Notes (Signed)
 EVENING ROUNDS NOTE :     [address] [employer] [address]             [phone]               3 Days Post-Op Procedure(s) (LRB): ECMO CANNULATION (N/A)   Total Length of Stay:  LOS: 41 days  Events:   Doing well On minimal chemical support       BP 113/65   Pulse 90   Temp 98.4 F (36.9 C)   Resp (!) 22   Ht 5' 4 (1.626 m)   Wt 81.8 kg   SpO2 97%   BMI 30.95 kg/m   PAP: (29-37)/(16-25) 37/20 CVP:  [14 mmHg-22 mmHg] 16 mmHg CO:  [2 L/min-2.7 L/min] 2.1 L/min CI:  [1.2 L/min/m2-1.6 L/min/m2] 1.3 L/min/m2  Vent Mode: PRVC FiO2 (%):  [70 %-80 %] 70 % Set Rate:  [22 bmp] 22 bmp Vt Set:  [440 mL] 440 mL PEEP:  [8 cmH20] 8 cmH20 Plateau Pressure:  [23 cmH20-28 cmH20] 24 cmH20   ceFEPime  (MAXIPIME ) IV     DOBUTamine  5 mcg/kg/min (02/12/24 1400)   feeding supplement (VITAL 1.5 CAL) 20 mL/hr at 02/12/24 1400   fentaNYL  infusion INTRAVENOUS 125 mcg/hr (02/12/24 1532)   fluconazole  (DIFLUCAN ) IV Stopped (02/12/24 1237)   norepinephrine  (LEVOPHED ) Adult infusion 1 mcg/min (02/12/24 1400)   propofol  (DIPRIVAN ) infusion Stopped (02/12/24 0834)   vasopressin  Stopped (02/11/24 1009)    I/O last 3 completed shifts: In: 3167.1 [I.V.:1499.3; Blood:630; NG/GT:503; IV Piggyback:534.8] Out: 2380 [Urine:1950; Emesis/NG output:250; Chest Tube:180]      Latest Ref Rng & Units 02/12/2024    1:46 PM 02/12/2024    8:17 AM 02/12/2024    4:10 AM  CBC  WBC 4.0 - 10.5 K/uL 9.1   9.0   Hemoglobin 12.0 - 15.0 g/dL 85.7  86.3  85.8   Hematocrit 36.0 - 46.0 % 42.6  40.0  42.0   Platelets 150 - 400 K/uL 41   46        Latest Ref Rng & Units 02/12/2024    1:46 PM 02/12/2024    8:17 AM 02/12/2024    4:10 AM  BMP  Glucose 70 - 99 mg/dL 865   881   BUN 8 - 23 mg/dL 50   45   Creatinine 9.55 - 1.00 mg/dL 7.21   7.29   Sodium 864 - 145 mmol/L 138  141  141   Potassium 3.5 - 5.1 mmol/L 4.2  3.8  3.9   Chloride 98 - 111 mmol/L 107   107   CO2 22 - 32 mmol/L  23   17   Calcium  8.9 - 10.3 mg/dL 7.9   7.9     ABG    Component Value Date/Time   PHART 7.365 02/12/2024 0817   PCO2ART 37.4 02/12/2024 0817   PO2ART 110 (H) 02/12/2024 0817   HCO3 21.4 02/12/2024 0817   TCO2 23 02/12/2024 0817   ACIDBASEDEF 4.0 (H) 02/12/2024 0817   O2SAT 98 02/12/2024 0817       Tracy Rayas, MD 02/12/2024 3:56 PM

## 2024-02-12 NOTE — Plan of Care (Signed)
  Problem: Cardiovascular: Goal: Ability to achieve and maintain adequate cardiovascular perfusion will improve Outcome: Progressing   Problem: Cardiac: Goal: Will achieve and/or maintain hemodynamic stability Outcome: Progressing   Problem: Pain Managment: Goal: General experience of comfort will improve and/or be controlled Outcome: Progressing

## 2024-02-12 NOTE — TOC Progression Note (Signed)
 Transition of Care [facility]) - Progression Note    Patient Details  Name: Tracy Baker MRN: [id number] Date of Birth: [birth year]  Transition of Care [facility] CM/SW Contact  Justina Delcia Czar, RN Phone Number: [phone] 02/12/2024, 1:56 PM  Clinical Narrative:    PT/OT recommended IP rehab vs SNF rehab. IP rehab was consulted and insurance denied IP rehab.  HF CSW will start SNF rehab referral when medically stable.  Chart reviewed for discharge readiness, patient not medically stable for d/c. Inpatient CM/CSW will continue to monitor pt's advancement through interdisciplinary progression rounds.   If new pt transition needs arise, MD please place a TOC consult.     Expected Discharge Plan: Skilled Nursing Facility Barriers to Discharge: Continued Medical Work up    Expected Discharge Plan and Services   Discharge Planning Services: CM Consult Post Acute Care Choice: Skilled Nursing Facility Living arrangements for the past 2 months: Apartment     Social Drivers of Health (SDOH) Interventions SDOH Screenings   Food Insecurity: No Food Insecurity (12/29/2023)  Housing: Low Risk  (12/29/2023)  Transportation Needs: No Transportation Needs (12/29/2023)  Utilities: Not At Risk (12/29/2023)  Alcohol Screen: Low Risk  (06/08/2022)  Depression (PHQ2-9): Low Risk  (11/08/2023)  Social Connections: Socially Isolated (12/29/2023)  Tobacco Use: High Risk (02/08/2024)    Readmission Risk Interventions     No data to display

## 2024-02-12 NOTE — Progress Notes (Signed)
 Advanced Heart Failure Rounding Note  Cardiologist: Lonni Cash, MD  Chief Complaint: S/p MV Repair / MV endocarditis  Patient Profile   Tracy Baker is a 68 y.o. female with hx of recent nephrolithiasis s/p ureteral stent and lithotripsy at [facility] 10/10 with stent removal. 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 Post-op course c/b post cardiotomy shock, junctional bradycardia, afib w/ RVR and HAP.   Subjective:    RHC 12/2 (on 0.125 of Milrinone ): RA 4, PA 42/13 (22), PCW 12 with Vwaves to 20, CO/CI TD 3.37/2.03, PAPi 7.5  12/5 S/P Redo MVR - Intra Op given  4UPRBCs, 2UPLTs, 2FFP, 1Cyro  12/6 Protek Duo cannulation 12/8 Decannulated  Currently on Norepi 3, DBA 5, iNO 20   CO-OX 56%.   PAP: (29-47)/(15-35) 33/19 CVP:  [12 mmHg-27 mmHg] 17 mmHg CO:  [1.7 L/min-2.7 L/min] 2.2 L/min CI:  [1 L/min/m2-1.6 L/min/m2] 1.3 L/min/m2  Intubated/sedated    Objective:    Weight Range: 81.8 kg Body mass index is 30.95 kg/m.   Vital Signs:   Temp:  [96.6 F (35.9 C)-98.2 F (36.8 C)] 97.2 F (36.2 C) (12/09 0700) Pulse Rate:  [80-90] 89 (12/09 0700) Resp:  [0-25] 22 (12/09 0700) BP: (100-111)/(57-60) 102/57 (12/08 1206) SpO2:  [90 %-100 %] 97 % (12/09 0700) Arterial Line BP: (81-147)/(57-76) 111/65 (12/09 0700) FiO2 (%):  [60 %-80 %] 70 % (12/09 0314) Weight:  [81.8 kg] 81.8 kg (12/09 0600) Last BM Date : 02/10/24  Weight change: Filed Weights   02/10/24 0500 02/11/24 0500 02/12/24 0600  Weight: 78 kg 82.3 kg 81.8 kg   Intake/Output:  Intake/Output Summary (Last 24 hours) at 02/12/2024 0719 Last data filed at 02/12/2024 0700 Gross per 24 hour  Intake 2373.01 ml  Output 1045 ml  Net 1328.01 ml    Physical Exam  General:   Intubated/Sedated Neck: R neck erythema. Cor: Regular rate & rhythm.  Lungs: clear Abdomen: soft, nontender, nondistended.  Extremities: no  edema Neuro: Intubated  Telemetry   Paced 90s    Labs    CBC Recent Labs    02/11/24 1528 02/11/24 1548 02/12/24 0410  WBC 8.2  --  9.0  HGB 14.3 15.0 14.1  HCT 42.1 44.0 42.0  MCV 89.4  --  91.5  PLT 51*  --  46*   Basic Metabolic Panel Recent Labs    87/91/74 1528 02/11/24 1548 02/12/24 0410  NA 141 140 141  K 4.2 4.1 3.9  CL 106  --  107  CO2 21*  --  17*  GLUCOSE 103*  --  118*  BUN 36*  --  45*  CREATININE 2.70*  --  2.70*  CALCIUM  7.8*  --  7.9*  MG 2.3  --  2.4  PHOS 6.3*  --  6.9*   Liver Function Tests Recent Labs    02/11/24 0509 02/12/24 0410  AST 52* 26  ALT <5 <5  ALKPHOS 86 99  BILITOT 1.4* 1.4*  PROT 4.6* 4.8*  ALBUMIN  2.7* 2.6*    Medications:    Scheduled Medications:  feeding supplement (PROSource TF20)  60 mL Per Tube Daily   feeding supplement (VITAL HIGH PROTEIN)  1,000 mL Per Tube Q24H   insulin  aspart  0-9 Units Subcutaneous Q4H   metoCLOPramide  (REGLAN ) injection  5 mg Intravenous Q8H   mouth rinse  15 mL Mouth Rinse Q2H   pantoprazole  (PROTONIX ) IV  40 mg Intravenous QHS   polyethylene glycol  17 g Per Tube Daily   senna-docusate  2 tablet Per Tube QHS   sodium chloride  flush  10-40 mL Intracatheter Q12H   sodium chloride  flush  10-40 mL Intracatheter Q12H   vancomycin  variable dose per unstable renal function (pharmacist dosing)   Does not apply See admin instructions    Infusions:  albumin  human Stopped (02/10/24 1900)   DOBUTamine  5 mcg/kg/min (02/12/24 0700)   fentaNYL  infusion INTRAVENOUS 200 mcg/hr (02/12/24 0700)   fluconazole  (DIFLUCAN ) IV Stopped (02/11/24 1133)   meropenem  (MERREM ) IV Stopped (02/12/24 9348)   midazolam  1 mg/hr (02/12/24 0700)   norepinephrine  (LEVOPHED ) Adult infusion 3 mcg/min (02/12/24 0700)   vasopressin  Stopped (02/11/24 1009)    PRN Medications: albumin  human, fentaNYL , midazolam , mouth rinse, sodium chloride  flush, sodium chloride  flush  Assessment/Plan   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 Kerrin for Candida endocarditis. Pre-op L/RHC 01/04/24 with no CAD and normal L/R heart pressures.  - Failed initial Milrinone  wean. Eventually weaned off, unfortunately restarted 12/1 for low output.  - TEE 11/18  with severe mitral regurgitation of repaired valve - S/P Redo MVR complicated by rapidly worsening RV failure -Protek Cannulation 12/6, Decannulated 12/8  Abx with vancomycin , meropenem  - Continue support with IV Norepi 3 + DBA 5 mcg. CO-OX 56%. [place] Shelter numbers dont seem to match clinical picture.Switch to CVP from PICC.Per Dr Zenaida hold off on diuresing for now.  - Remove swan.   Acute hypoxic respiratory failure: Suspect component of infection/aspiration given unilateral disease.  - Continue vancomycin /meropenem  - Bronch with GNR, narrow abx as able  Coagulopathy/thrombocytopenia/ABLA anemia:  - Thrombocytopenia present, but stable around 50 - INR now back to normal, PTT not significantly elevated, no DIC - Hgb goal >8 - Plt goal >50  Sick sinus - Off amiodarone  - Now with junctional rhythm in the 30s underneath - Continue DDD at 80 - suspect due to underlying swelling  Afib - Post operative - No OAC, in afib las on 12/6  AKI on CKD 3b - Scr baseline 1.4-1.5 - Hold diuretics currently, reassess later today  Protein-calorie malnutrition - Starting trickle feeds today.   Epistaxis  - s/p bilateral nasal packing per ENT 12/1    - Off AC  Length of stay: 41  Valentin Benney, NP  02/12/24, 7:19 AM  CRITICAL CARE Performed by: Greig Mosses   Total critical care time:10 Critical care time was exclusive of separately billable procedures and treating other patients.  Critical care was necessary to treat or prevent imminent or life-threatening deterioration.  Critical care was time spent personally by me on the following  activities: development of treatment plan with patient and/or surrogate as well as nursing, discussions with consultants, evaluation of patient's response to treatment, examination of patient, obtaining history from patient or surrogate, ordering and performing treatments and interventions, ordering and review of laboratory studies, ordering and review of radiographic studies, pulse oximetry and re-evaluation of patient's condition.

## 2024-02-12 NOTE — Progress Notes (Signed)
 Pharmacy Antibiotic Note  Tracy Baker is a 68 y.o. female admitted on 12/28/2023 with PA-ECMO.  Pharmacy has been consulted for merrem  dosing.  Cannulated with Protek Duo 12/6.   Initially consulted for vancomycin  post cannulation.   After additional manipulation of cannula 12/6, decision made to add merrem  as well.  D#5 vancomycin , meropenem .  AKI stablizing today?  12/5 Vancomycin  1250 mg x 1 12/6 VR @1605  = 21 mcg/dL 87/2 VR @0955  = 19 mcg/dL 87/0 VR ~ 9589 = 16 mcg/dL   Plan: Continue Fluconazole  IV 400 mg daily Continue Merrem  1g IV q12 Variable vancomycin  based on levels - give 1000 mg x 1 today.  Planning total of 7d vancomycin  (through 12/11)  Height: 5' 4 (162.6 cm) Weight: 81.8 kg (180 lb 5.4 oz) IBW/kg (Calculated) : 54.7  Temp (24hrs), Avg:97.6 F (36.4 C), Min:96.6 F (35.9 C), Max:98.2 F (36.8 C)  Recent Labs  Lab 02/10/24 0511 02/10/24 0518 02/10/24 0846 02/10/24 0955 02/10/24 1239 02/10/24 1709 02/10/24 1710 02/10/24 1853 02/11/24 0509 02/11/24 0515 02/11/24 0838 02/11/24 1528 02/12/24 0410 02/12/24 0822  WBC 7.2  --   --   --   --  5.0  --   --  5.7  --   --  8.2 9.0  --   CREATININE 2.03*  2.08*  --   --   --  2.16*  --  2.26*  --  2.50*  --   --  2.70* 2.70*  --   LATICACIDVEN  --    < > 1.0  --   --   --   --  1.1  --  1.3 1.2  --   --  1.2  VANCORANDOM  --   --   --  19  --   --   --   --   --   --   --   --  16  --    < > = values in this interval not displayed.    Estimated Creatinine Clearance: 20.6 mL/min (A) (by C-G formula based on SCr of 2.7 mg/dL (H)).    Allergies  Allergen Reactions   Codeine Anaphylaxis and Nausea And Vomiting   Bactoshield Chg [Chlorhexidine  Gluconate] Dermatitis   Red Dye #40 (Allura Red) Nausea Only    Antimicrobials this admission: Zosyn 10/19>10/20 CRO 10/20 >>10/22 Azithro 10/22 >> 10/24 Fluconazole  10/19 >> 10/25 Micafungin  10/25 >> 10/11 Fluconazole  11/11 >> [12/18] Linezolid  11/13 >>  11/17 Cefepime  11/13 >> 11/17 Vanc 12/5>> Merrem  12/6>>   Microbiology results: 10/18 Bcx: 1/2 Candida albicans  10/20 BCx: budding yeasts  10/22, 10/24, 10/25 Bcx: all neg 10/18 UCx: budding yeasts  12/7 BAL - GNR- suscp pending  Harlene Denna Berdine JONETTA ARABELLA, BCCP Clinical Pharmacist  02/12/2024 10:47 AM   [facility] pharmacy phone numbers are listed on [email or web address]

## 2024-02-12 NOTE — Progress Notes (Signed)
 PT Cancellation Note  Patient Details Name: Caera Enwright MRN: [id number] DOB: 02-06-1956   Cancelled Treatment:    Reason Eval/Treat Not Completed: Medical issues which prohibited therapy remain today, pt intubated and sedated initially. Discussed wean from sedation with RN and although some sedation weaned throughout day, pt still unable to maintain alertness to participate in evaluation. Will continue to follow and evaluate as appropriate.   Izetta Call, PT, DPT   Acute Rehabilitation Department Office [phone] Secure Chat Communication Preferred   Izetta JULIANNA Call 02/12/2024, 1:44 PM

## 2024-02-12 NOTE — Progress Notes (Signed)
 OT Cancellation Note  Patient Details Name: Tracy Baker MRN: [id number] DOB: 1956/01/05   Cancelled Treatment:    Reason Eval/Treat Not Completed: Medical issues which prohibited therapy (intubated/sedated)  Sanders Manninen K, OTD, OTR/L SecureChat Preferred Acute Rehab [phone]   Laneta MARLA Pereyra 02/12/2024, 3:06 PM

## 2024-02-12 NOTE — Progress Notes (Signed)
 Nutrition Follow-up  DOCUMENTATION CODES:   Severe malnutrition in context of acute illness/injury  INTERVENTION:   Recommend Cortrak placement, prior to extubation if feasible,  as pt with inadequate oral intake since admission. Discussed with Dr. Gretta, plan for Cortrak prior to extubation but waiting until platelets improve  Tube Feeding via OG: Continue trickle TF today per Dr. Gretta Change to Vital 1.5 at 20 ml/hr  TF goal regimen: Vital 1.5 at 50 ml/hr with Pro-Source TF20 60 mL daily TF provides 1880 kcals, 101 g of protein and   Add Pro-Source TF20 60 mL QID while on trickle TF -plan to monitor tolerance give reported intolerance to red dye (nausea reported), each packet provides 20 grams of protein and 80 kcals.    NUTRITION DIAGNOSIS:   Severe Malnutrition related to acute illness as evidenced by moderate muscle depletion, mild muscle depletion, energy intake < or equal to 50% for > or equal to 5 days.  Continues but being addressed via TF   GOAL:   Patient will meet greater than or equal to 90% of their needs  Progressing  MONITOR:   Vent status, TF tolerance, Labs, Weight trends  REASON FOR ASSESSMENT:   Consult Other (Comment) (new ECMO)  ASSESSMENT:   68 yo female admitted with hx of Calbicans candidemia, being treated at [facility] in [place] but left AMA and presents with MV endocarditis requiring MVR, post-op course complicated by shock post-op, junctional bradycardia, afib with RVR and pneumonia. PMH includes nephrolithiasis s/p ureteral stent and lithotripsy  12/01  Epistaxis requiring bilateral nasal packing by ENT; N/V noted; refusing antiemetic 12/02 RHC: sig reduced cardiac index likely d/t MR; normal filling pressures noted 12/05 NPO, OR: Redo MVR 12/06 Shock post op (R predominant)-significant pressor requirements (Levo at 34, Vaso at 0.04, Epi at 10, DBA at 5, milrinone  at 0.375); Cath Lab for Protek Duo Cannulation 12/08 Protek Duo  De-Cannulation, Trickle TF initiated  Pt remains on vent support; sedated on fentanyl  and versed  gtt Levophed  at 3 DBA at 5   Trickle TF of Vital High Protein infusing at 20 ml/hr via OG, started by MD via Adult TF protocol yesterday, pt tolerating  Noted pro-source ordered yesterday via protocol as well; pt with hx of red dye intolerance with symptom of nausea.   NPO since 12/05 with trickle TF initiated 12/09.   Last BM 12/7, abdomen soft, BS present   Labs: BUN 45 Creatinine 2.70 Phosphorus 6.9 (H) Potassium 3.9 (wdl) Sodium 141 (wdl) Magnesium  2.4 (wdl)  Meds: Lasix  SS novolog  Reglan  Miralax  daily Senna-Docusate daily   Diet Order:   Diet Order             Diet NPO time specified  Diet effective now                   EDUCATION NEEDS:   Education needs have been addressed  Skin:  Skin Assessment: Skin Integrity Issues: Skin Integrity Issues:: Incisions Incisions: chest (closed)  Last BM:  12/7  Height:   Ht Readings from Last 1 Encounters:  01/10/24 5' 4 (1.626 m)    Weight:   Wt Readings from Last 1 Encounters:  02/12/24 81.8 kg    BMI:  Body mass index is 30.95 kg/m.  Estimated Nutritional Needs:   Kcal:  1800-2000  Protein:  100-115g  Fluid:  1 L + UOP   [name] MS, RDN, LDN, CNSC Registered Dietitian 3 Clinical Nutrition RD Inpatient Contact Info in Amion

## 2024-02-12 NOTE — Progress Notes (Signed)
 NAME:  Jatasia Gundrum, MRN:  [id number], DOB:  [birth year], LOS: 41 ADMISSION DATE:  12/28/2023, CONSULTATION DATE:  02/08/24 REFERRING MD:  Kerrin RAMAN., CHIEF COMPLAINT:  s/p MVR   History of Present Illness:  Norine Reddington is a 68 year old female with past medical history significant for nephrolithiasis s/p ureteral stent and lithotripsy at [facility] 10/10, with stent removal, severe MR, MV repair (01/10/24) due to MV Candida endocarditis c/b continued severe MR, who presents today for a redo sternotomy and MVR w/ Dr. Kerrin. PCCM consulted for ICU post-op vent management.  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 Kerrin 12/6: Overnight patient started requiring more vasopressor support, early morning she was on dobutamine  5, milrinone  0.375, epinephrine  at 10, vasopressin  0.04 and norepinephrine  34 mics, she is not making urine.  Cannulated with Protek duo for RV support,  12/7 bronch, broadened antibiotics 12/8 decannulated from VV ECMO after sweep and flow wean trials. Lasix  stopped. Restarted TF.  Interim History / Subjective:  Overnight no acute events.  Objective    Blood pressure 111/66, pulse 90, temperature (!) 97.2 F (36.2 C), resp. rate (!) 23, height 5' 4 (1.626 m), weight 81.8 kg, SpO2 97%. PAP: (29-47)/(15-35) 33/19 CVP:  [12 mmHg-27 mmHg] 17 mmHg CO:  [1.7 L/min-2.7 L/min] 2.2 L/min CI:  [1 L/min/m2-1.6 L/min/m2] 1.3 L/min/m2  Vent Mode: PRVC FiO2 (%):  [70 %-80 %] 70 % Set Rate:  [22 bmp] 22 bmp Vt Set:  [440 mL] 440 mL PEEP:  [8 cmH20] 8 cmH20 Plateau Pressure:  [23 cmH20-27 cmH20] 23 cmH20   Intake/Output Summary (Last 24 hours) at 02/12/2024 9191 Last data filed at 02/12/2024 0700 Gross per 24 hour  Intake  2210.91 ml  Output 895 ml  Net 1315.91 ml   Filed Weights   02/10/24 0500 02/11/24 0500 02/12/24 0600  Weight: 78 kg 82.3 kg 81.8 kg     Physical exam: General: critically ill appearing woman lying in bed in NAD HEENT: [place]/AT, eyes anicteric Neck: RIJ cannulation site without hematoma, skin breakdown from previous dressing Neuro:  RASS -5, pinpoint pupils.  Chest: rhales on the R>L, decreased R basilar breath sounds Heart: S1S2, RRR- paced rhythm Abdomen: soft, NT  UOP 905cc L, net +3.5L for admission  Coox 64% 7.39/37/110/21 BUN 45 Cr 2.7 LDH 444 LA 1.2  H/H 14.1/42 Platelets 46 INR 1.3  CXR personally reviewed> R lung still opacified, chest tubes. R PICC, LIJ swan.    Patient Lines/Drains/Airways Status     Active Line/Drains/Airways     Name Placement date Placement time Site Days   Arterial Line 02/11/24 Left Brachial 02/11/24  1000  Brachial  1   PICC Double Lumen 01/15/24 Right Basilic 35 cm 0 cm 01/15/24  8384  -- 28   NG/OG Vented/Dual Lumen 16 Fr. Oral 02/08/24  1333  Oral  4   Urethral Catheter [name], RN Latex;Straight-tip;Temperature probe 14 Fr. 02/08/24  0800  Latex;Straight-tip;Temperature probe  4   Y Chest Tube 1, 2, and 3 1 Right Pleural 28 Fr. 2 Left Pleural 28 Fr. 3 Anterior Mediastinal 36 Fr. 02/08/24  1255  -- 4   Airway 8 mm 02/08/24  0800  -- 4   Pulmonary Artery Catheter 02/09/24 Left 02/09/24  1051  --  3   Wound 01/07/24 0814 Surgical Closed Surgical Incision Lip Other (Comment) 01/07/24  0814  Lip  36   Wound 01/10/24 0735 Surgical Closed Surgical Incision Chest Other (Comment) 01/10/24  0735  Chest  33   Wound 01/16/24 0800 Traumatic Back Left;Upper 01/16/24  0800  Back  27           Resolved Problem List:   Assessment and Plan:  MV endocarditis s/p MV repair 01/10/2024, with persistent severe mitral regurgitation and status post redo sternotomy and bioprosthetic MVR Previous candidemia Acute RV failure with cardiogenic shock  status post Protek duo VV ECMO cannula on 12/6, decannulated 12/8 -con't iNO, need to work on weaning this as able -con't DBA per AHF, NE to maintain MAP >65 -kplanning to d/c swan today -con't diflucan  -daily aspirin   Shock, mixed cardiogenic and distributive; improving -NE to maintain AMP >65; off vasopressin  -con't DBA -check LA today with low bicarb on labs  Acute respiratory failure with hypoxia RUL pneumonia -LTVV -ABG -PAD protocol; off midazolam ; con't fentanyl . Can add propofol  as needed. Goal RASS -1 to -2.  -VAP prevention protocol -follow respiratory culture; con't meropenem  and adjust as needed  AKI on CKD stage IIIa, worsening -strict I/O -holding off on aggressive diuresis today; lasix  80mg  once. Check CVP on PICC to coorrelate with swan numbers.  -renally dose meds, avoid nephrotoxic meds -con't foley  Hypoglycemia H/o prediabetes, A1c 6 -SSI PRN; low requirements -cortrak once platelets are recovering  Ileus; no issues with constipation -con't reglan  with trickle TF; monitor for signs of intolerance -increase miralax  to BID since no BM yesterday; con't senna daily  Acute blood loss anemia status post multiple PRBC transfusion Thrombocytopenia due to critical illness status post 2 unit platelets Coagulopathy, probably from liver congestion requiring FFP transfusion -previously given vit K -holding DVT prophylaxis for now -no current need for transfusion  Severe protein calorie malnutrition -trickle TF; can advance tomorrow if tolerating -anticipate a prolonged need for supplemental nutrition  Hyperbilirubinemia; unclear etiology -monitor  Deconditioning, prolonged course leading up to this  -PT, OT once able  Labs:  CBC: Recent Labs  Lab 02/10/24 0511 02/10/24 0513 02/10/24 0700 02/10/24 0745 02/10/24 1709 02/10/24 1711 02/11/24 0509 02/11/24 0837 02/11/24 0919 02/11/24 1011 02/11/24 1528 02/11/24 1548 02/12/24 0410  WBC 7.2   --   --   --  5.0  --  5.7  --   --   --  8.2  --  9.0  HGB 10.0*   < >  --    < > 9.9*   < > 10.2*  9.9*   < > 9.9* 9.9* 14.3 15.0 14.1  HCT 29.2*   < >  --    < > 30.2*   < > 31.2*  29.0*   < > 29.0* 29.0* 42.1 44.0 42.0  MCV 89.0  --   --   --  92.1  --  93.4  --   --   --  89.4  --  91.5  PLT 77*  --  76*  --  63*  --  52*  --   --   --  51*  --  46*   < > = values in this interval not displayed.    Basic Metabolic Panel: Recent Labs  Lab 02/10/24 0511 02/10/24 0513 02/10/24 1239 02/10/24 1710 02/10/24 1711 02/11/24 0509 02/11/24 0837 02/11/24 0919 02/11/24 1011 02/11/24 1528 02/11/24 1548 02/12/24 0410  NA 141  142   < >  139 139   < > 139  140   < > 140 140 141 140 141  K 3.9  3.9   < > 4.5 4.2   < > 4.7  4.4   < > 4.0 3.9 4.2 4.1 3.9  CL 105  104  --  105 104  --  105  --   --   --  106  --  107  CO2 24  23  --  24 23  --  22  --   --   --  21*  --  17*  GLUCOSE 121*  122*  --  109* 107*  --  107*  --   --   --  103*  --  118*  BUN 26*  24*  --  27* 29*  --  34*  --   --   --  36*  --  45*  CREATININE 2.03*  2.08*  --  2.16* 2.26*  --  2.50*  --   --   --  2.70*  --  2.70*  CALCIUM  7.7*  8.0*  --  7.3* 7.4*  --  7.7*  --   --   --  7.8*  --  7.9*  MG 2.5*  --   --  2.3  --  2.4  --   --   --  2.3  --  2.4  PHOS 5.9*  5.9*  --   --   --   --   --   --   --   --  6.3*  --  6.9*   < > = values in this interval not displayed.   GFR: Estimated Creatinine Clearance: 20.6 mL/min (A) (by C-G formula based on SCr of 2.7 mg/dL (H)). Recent Labs  Lab 02/10/24 0846 02/10/24 1709 02/10/24 1853 02/11/24 0509 02/11/24 0515 02/11/24 0838 02/11/24 1528 02/12/24 0410  WBC  --  5.0  --  5.7  --   --  8.2 9.0  LATICACIDVEN 1.0  --  1.1  --  1.3 1.2  --   --     Liver Function Tests: Recent Labs  Lab 02/09/24 1310 02/10/24 0511 02/10/24 1710 02/11/24 0509 02/12/24 0410  AST 99* 76* 56* 52* 26  ALT 12 6 5  <5 <5  ALKPHOS 56 67 83 86 99  BILITOT 0.9 1.2 1.1  1.4* 1.4*  PROT 3.6* 4.4* 4.7* 4.6* 4.8*  ALBUMIN  2.5* 3.1*  3.2* 3.0* 2.7* 2.6*   ABG    Component Value Date/Time   PHART 7.340 (L) 02/11/2024 1548   PCO2ART 39.6 02/11/2024 1548   PO2ART 183 (H) 02/11/2024 1548   HCO3 21.4 02/11/2024 1548   TCO2 23 02/11/2024 1548   ACIDBASEDEF 4.0 (H) 02/11/2024 1548   O2SAT 63.9 02/12/2024 0609    Coagulation Profile: Recent Labs  Lab 02/10/24 0511 02/10/24 0700 02/10/24 1239 02/11/24 0509 02/12/24 0410  INR 4.6* 5.3* 3.1* 1.6* 1.3*      CBG: Recent Labs  Lab 02/11/24 1154 02/11/24 1526 02/11/24 2140 02/11/24 2359 02/12/24 0409  GLUCAP 103* 118* 113* 131* 114*   This patient is critically ill with multiple organ system failure which requires frequent high complexity decision making, assessment, support, evaluation, and titration of therapies. This was completed through the application of advanced monitoring technologies and extensive interpretation of multiple databases. During this encounter critical care time was devoted to patient care services described in this note for 39 minutes.  Leita SHAUNNA Gaskins, DO 02/12/24  8:31 AM [facility]  For contact information, see Amion. If no response to pager, please call PCCM consult pager. After hours, 7PM- 7AM, please call Elink.

## 2024-02-13 ENCOUNTER — Inpatient Hospital Stay (HOSPITAL_COMMUNITY)

## 2024-02-13 ENCOUNTER — Ambulatory Visit: Admitting: Cardiology

## 2024-02-13 DIAGNOSIS — R570 Cardiogenic shock: Secondary | ICD-10-CM | POA: Diagnosis not present

## 2024-02-13 DIAGNOSIS — N179 Acute kidney failure, unspecified: Secondary | ICD-10-CM | POA: Diagnosis not present

## 2024-02-13 DIAGNOSIS — I340 Nonrheumatic mitral (valve) insufficiency: Secondary | ICD-10-CM | POA: Diagnosis not present

## 2024-02-13 DIAGNOSIS — N183 Chronic kidney disease, stage 3 unspecified: Secondary | ICD-10-CM | POA: Diagnosis not present

## 2024-02-13 DIAGNOSIS — J9601 Acute respiratory failure with hypoxia: Secondary | ICD-10-CM | POA: Diagnosis not present

## 2024-02-13 DIAGNOSIS — B377 Candidal sepsis: Secondary | ICD-10-CM | POA: Diagnosis not present

## 2024-02-13 LAB — CBC
HCT: 43.2 % (ref 36.0–46.0)
Hemoglobin: 14.2 g/dL (ref 12.0–15.0)
MCH: 30.3 pg (ref 26.0–34.0)
MCHC: 32.9 g/dL (ref 30.0–36.0)
MCV: 92.1 fL (ref 80.0–100.0)
Platelets: 45 K/uL — ABNORMAL LOW (ref 150–400)
RBC: 4.69 MIL/uL (ref 3.87–5.11)
RDW: 20.5 % — ABNORMAL HIGH (ref 11.5–15.5)
WBC: 8.5 K/uL (ref 4.0–10.5)
nRBC: 0 % (ref 0.0–0.2)

## 2024-02-13 LAB — POCT I-STAT 7, (LYTES, BLD GAS, ICA,H+H)
Acid-base deficit: 4 mmol/L — ABNORMAL HIGH (ref 0.0–2.0)
Acid-base deficit: 5 mmol/L — ABNORMAL HIGH (ref 0.0–2.0)
Acid-base deficit: 5 mmol/L — ABNORMAL HIGH (ref 0.0–2.0)
Acid-base deficit: 7 mmol/L — ABNORMAL HIGH (ref 0.0–2.0)
Bicarbonate: 20.2 mmol/L (ref 20.0–28.0)
Bicarbonate: 20.2 mmol/L (ref 20.0–28.0)
Bicarbonate: 19.9 mmol/L — ABNORMAL LOW (ref 20.0–28.0)
Bicarbonate: 18 mmol/L — ABNORMAL LOW (ref 20.0–28.0)
Calcium, Ion: 1.11 mmol/L — ABNORMAL LOW (ref 1.15–1.40)
Calcium, Ion: 1.13 mmol/L — ABNORMAL LOW (ref 1.15–1.40)
Calcium, Ion: 1.12 mmol/L — ABNORMAL LOW (ref 1.15–1.40)
Calcium, Ion: 1.15 mmol/L (ref 1.15–1.40)
HCT: 42 % (ref 36.0–46.0)
HCT: 42 % (ref 36.0–46.0)
HCT: 40 % (ref 36.0–46.0)
HCT: 44 % (ref 36.0–46.0)
Hemoglobin: 14.3 g/dL (ref 12.0–15.0)
Hemoglobin: 14.3 g/dL (ref 12.0–15.0)
Hemoglobin: 13.6 g/dL (ref 12.0–15.0)
Hemoglobin: 15 g/dL (ref 12.0–15.0)
O2 Saturation: 100 %
O2 Saturation: 98 %
O2 Saturation: 98 %
O2 Saturation: 96 %
Patient temperature: 37
Patient temperature: 37.1
Patient temperature: 37
Patient temperature: 37.2
Potassium: 3.3 mmol/L — ABNORMAL LOW (ref 3.5–5.1)
Potassium: 3.9 mmol/L (ref 3.5–5.1)
Potassium: 3.6 mmol/L (ref 3.5–5.1)
Potassium: 4.3 mmol/L (ref 3.5–5.1)
Sodium: 141 mmol/L (ref 135–145)
Sodium: 143 mmol/L (ref 135–145)
Sodium: 142 mmol/L (ref 135–145)
Sodium: 142 mmol/L (ref 135–145)
TCO2: 21 mmol/L — ABNORMAL LOW (ref 22–32)
TCO2: 21 mmol/L — ABNORMAL LOW (ref 22–32)
TCO2: 21 mmol/L — ABNORMAL LOW (ref 22–32)
TCO2: 19 mmol/L — ABNORMAL LOW (ref 22–32)
pCO2 arterial: 35.1 mmHg (ref 32–48)
pCO2 arterial: 36.3 mmHg (ref 32–48)
pCO2 arterial: 35.7 mmHg (ref 32–48)
pCO2 arterial: 33.4 mmHg (ref 32–48)
pH, Arterial: 7.369 (ref 7.35–7.45)
pH, Arterial: 7.354 (ref 7.35–7.45)
pH, Arterial: 7.355 (ref 7.35–7.45)
pH, Arterial: 7.34 — ABNORMAL LOW (ref 7.35–7.45)
pO2, Arterial: 184 mmHg — ABNORMAL HIGH (ref 83–108)
pO2, Arterial: 112 mmHg — ABNORMAL HIGH (ref 83–108)
pO2, Arterial: 108 mmHg (ref 83–108)
pO2, Arterial: 89 mmHg (ref 83–108)

## 2024-02-13 LAB — COOXEMETRY PANEL
Carboxyhemoglobin: 1.6 % — ABNORMAL HIGH (ref 0.5–1.5)
Methemoglobin: 0.9 % (ref 0.0–1.5)
O2 Saturation: 69.2 %
Total hemoglobin: 12.5 g/dL (ref 12.0–16.0)

## 2024-02-13 LAB — TRIGLYCERIDES: Triglycerides: 169 mg/dL — ABNORMAL HIGH (ref <150)

## 2024-02-13 LAB — BASIC METABOLIC PANEL WITH GFR
Anion gap: 12 (ref 5–15)
Anion gap: 14 (ref 5–15)
BUN: 67 mg/dL — ABNORMAL HIGH (ref 8–23)
BUN: 74 mg/dL — ABNORMAL HIGH (ref 8–23)
CO2: 21 mmol/L — ABNORMAL LOW (ref 22–32)
CO2: 18 mmol/L — ABNORMAL LOW (ref 22–32)
Calcium: 7.9 mg/dL — ABNORMAL LOW (ref 8.9–10.3)
Calcium: 8.3 mg/dL — ABNORMAL LOW (ref 8.9–10.3)
Chloride: 107 mmol/L (ref 98–111)
Chloride: 111 mmol/L (ref 98–111)
Creatinine, Ser: 2.82 mg/dL — ABNORMAL HIGH (ref 0.44–1.00)
Creatinine, Ser: 2.61 mg/dL — ABNORMAL HIGH (ref 0.44–1.00)
GFR, Estimated: 18 mL/min — ABNORMAL LOW (ref >60)
GFR, Estimated: 19 mL/min — ABNORMAL LOW (ref >60)
Glucose, Bld: 127 mg/dL — ABNORMAL HIGH (ref 70–99)
Glucose, Bld: 188 mg/dL — ABNORMAL HIGH (ref 70–99)
Potassium: 3.2 mmol/L — ABNORMAL LOW (ref 3.5–5.1)
Potassium: 4.4 mmol/L (ref 3.5–5.1)
Sodium: 140 mmol/L (ref 135–145)
Sodium: 143 mmol/L (ref 135–145)

## 2024-02-13 LAB — HEPATIC FUNCTION PANEL
ALT: <5 U/L (ref 0–44)
AST: 18 U/L (ref 15–41)
Albumin: 2.4 g/dL — ABNORMAL LOW (ref 3.5–5.0)
Alkaline Phosphatase: 105 U/L (ref 38–126)
Bilirubin, Direct: 0.4 mg/dL — ABNORMAL HIGH (ref 0.0–0.2)
Indirect Bilirubin: 1 mg/dL — ABNORMAL HIGH (ref 0.3–0.9)
Total Bilirubin: 1.4 mg/dL — ABNORMAL HIGH (ref 0.0–1.2)
Total Protein: 4.8 g/dL — ABNORMAL LOW (ref 6.5–8.1)

## 2024-02-13 LAB — FIBRINOGEN: Fibrinogen: 478 mg/dL — ABNORMAL HIGH (ref 210–475)

## 2024-02-13 LAB — PROTIME-INR
INR: 1.2 (ref 0.8–1.2)
Prothrombin Time: 15.4 s — ABNORMAL HIGH (ref 11.4–15.2)

## 2024-02-13 LAB — GLUCOSE, CAPILLARY
Glucose-Capillary: 113 mg/dL — ABNORMAL HIGH (ref 70–99)
Glucose-Capillary: 134 mg/dL — ABNORMAL HIGH (ref 70–99)
Glucose-Capillary: 151 mg/dL — ABNORMAL HIGH (ref 70–99)
Glucose-Capillary: 183 mg/dL — ABNORMAL HIGH (ref 70–99)
Glucose-Capillary: 148 mg/dL — ABNORMAL HIGH (ref 70–99)
Glucose-Capillary: 152 mg/dL — ABNORMAL HIGH (ref 70–99)

## 2024-02-13 LAB — MAGNESIUM: Magnesium: 2.3 mg/dL (ref 1.7–2.4)

## 2024-02-13 LAB — PHOSPHORUS: Phosphorus: 5.9 mg/dL — ABNORMAL HIGH (ref 2.5–4.6)

## 2024-02-13 LAB — LACTATE DEHYDROGENASE: LDH: 391 U/L — ABNORMAL HIGH (ref 105–235)

## 2024-02-13 MED ORDER — POTASSIUM CHLORIDE 10 MEQ/50ML IV SOLN
10.0000 meq | INTRAVENOUS | Status: AC
  Administered 2024-02-13 (×2): 10 meq via INTRAVENOUS
  Filled 2024-02-13 (×2): qty 50

## 2024-02-13 MED ORDER — POLYETHYLENE GLYCOL 3350 17 G PO PACK: 17.0000 g | PACK | Freq: Every day | ORAL | Status: DC | PRN

## 2024-02-13 MED ORDER — POTASSIUM CHLORIDE 20 MEQ PO PACK
40.0000 meq | PACK | Freq: Once | ORAL | Status: AC
  Administered 2024-02-13: 40 meq
  Filled 2024-02-13: qty 2

## 2024-02-13 MED ORDER — METOLAZONE 5 MG PO TABS
5.0000 mg | ORAL_TABLET | Freq: Once | ORAL | Status: AC
  Administered 2024-02-13: 5 mg
  Filled 2024-02-13: qty 1

## 2024-02-13 MED ORDER — FUROSEMIDE 10 MG/ML IJ SOLN
30.0000 mg/h | INTRAVENOUS | Status: DC
  Administered 2024-02-13: 10 mg/h via INTRAVENOUS
  Administered 2024-02-14: 30 mg/h via INTRAVENOUS
  Administered 2024-02-14: 10 mg/h via INTRAVENOUS
  Administered 2024-02-15 – 2024-02-17 (×8): 30 mg/h via INTRAVENOUS
  Filled 2024-02-13 (×11): qty 20
  Filled 2024-02-13: qty 200
  Filled 2024-02-13: qty 20

## 2024-02-13 MED ORDER — VITAL 1.5 CAL PO LIQD
1000.0000 mL | ORAL | Status: DC
  Administered 2024-02-13 – 2024-02-14 (×2): 1000 mL

## 2024-02-13 MED ORDER — POTASSIUM CHLORIDE 20 MEQ PO PACK: 40.0000 meq | PACK | Freq: Once | ORAL | Status: DC

## 2024-02-13 MED ORDER — FENTANYL CITRATE (PF) 50 MCG/ML IJ SOSY: 25.0000 ug | PREFILLED_SYRINGE | Freq: Once | INTRAMUSCULAR | Status: DC

## 2024-02-13 MED ORDER — PROMETHAZINE HCL 12.5 MG PO TABS
12.5000 mg | ORAL_TABLET | Freq: Three times a day (TID) | ORAL | Status: DC
  Administered 2024-02-13 – 2024-02-14 (×3): 12.5 mg
  Filled 2024-02-13 (×5): qty 1

## 2024-02-13 MED ORDER — PROMETHAZINE (PHENERGAN) 6.25MG IN NS 50ML IVPB
6.2500 mg | Freq: Four times a day (QID) | INTRAVENOUS | Status: DC | PRN
  Administered 2024-02-19 – 2024-02-28 (×7): 6.25 mg via INTRAVENOUS
  Filled 2024-02-13: qty 6.25
  Filled 2024-02-13: qty 50
  Filled 2024-02-13: qty 6.25
  Filled 2024-02-13: qty 50
  Filled 2024-02-13 (×5): qty 6.25

## 2024-02-13 MED ORDER — FUROSEMIDE 10 MG/ML IJ SOLN
120.0000 mg | Freq: Once | INTRAVENOUS | Status: AC
  Administered 2024-02-13: 120 mg via INTRAVENOUS
  Filled 2024-02-13: qty 10

## 2024-02-13 NOTE — Progress Notes (Addendum)
 Advanced Heart Failure Rounding Note  Cardiologist: Lonni Cash, MD  Chief Complaint: S/p MV Repair / MV endocarditis  Patient Profile   Tracy Baker is a 68 y.o. female with hx of recent nephrolithiasis s/p ureteral stent and lithotripsy at [facility] 10/10 with stent removal. 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 Post-op course c/b post cardiotomy shock, junctional bradycardia, afib w/ RVR and HAP.   Subjective:    RHC 12/2 (on 0.125 of Milrinone ): RA 4, PA 42/13 (22), PCW 12 with Vwaves to 20, CO/CI TD 3.37/2.03, PAPi 7.5  12/5 S/P Redo MVR - Intra Op given  4UPRBCs, 2UPLTs, 2FFP, 1Cyro  12/6 Protek Duo cannulation 12/8 Decannulated 12/9 Diuresed with IV lasix  120 mg x1.   Currently on Norepi 1, DBA 5, iNO 15    CO-OX 69%   Remains intubated. Following commands.   Objective:    Weight Range: 79.9 kg Body mass index is 30.24 kg/m.   Vital Signs:   Temp:  [97.3 F (36.3 C)-98.6 F (37 C)] 98.6 F (37 C) (12/10 0800) Pulse Rate:  [87-94] 89 (12/10 0800) Resp:  [0-30] 25 (12/10 0800) BP: (113)/(65) 113/65 (12/09 1525) SpO2:  [91 %-99 %] 95 % (12/10 0800) Arterial Line BP: (95-158)/(58-86) 119/63 (12/10 0800) FiO2 (%):  [50 %-70 %] 50 % (12/10 0800) Weight:  [79.9 kg] 79.9 kg (12/10 0500) Last BM Date : 02/13/24  Weight change: Filed Weights   02/11/24 0500 02/12/24 0600 02/13/24 0500  Weight: 82.3 kg 81.8 kg 79.9 kg   Intake/Output:  Intake/Output Summary (Last 24 hours) at 02/13/2024 0828 Last data filed at 02/13/2024 0800 Gross per 24 hour  Intake 1569.25 ml  Output 850 ml  Net 719.25 ml   CVP 13-16  Physical Exam  General:   Intubated Neck: JVP difficult to assess due to body habitus. R neck erythema  Cor: Regular rate & rhythm.  Lungs: coarse throughout Abdomen: soft, nontender, nondistended.  Extremities: no  edema. R and LLE unna boots.  Neuro: Opens eyes  Telemetry   Paced 90s   Labs     CBC Recent Labs    02/12/24 1346 02/13/24 0339 02/13/24 0741  WBC 9.1 8.5  --   HGB 14.2 14.2 14.3  HCT 42.6 43.2 42.0  MCV 91.6 92.1  --   PLT 41* 45*  --    Basic Metabolic Panel Recent Labs    87/90/74 0410 02/12/24 0817 02/12/24 1346 02/13/24 0339 02/13/24 0741  NA 141   < > 138 140 141  K 3.9   < > 4.2 3.2* 3.3*  CL 107  --  107 107  --   CO2 17*  --  23 21*  --   GLUCOSE 118*  --  134* 127*  --   BUN 45*  --  50* 67*  --   CREATININE 2.70*  --  2.78* 2.82*  --   CALCIUM  7.9*  --  7.9* 7.9*  --   MG 2.4  --   --  2.3  --   PHOS 6.9*  --   --  5.9*  --    < > = values in this interval not displayed.   Liver Function Tests Recent Labs    02/12/24 0410 02/13/24 0339  AST 26 18  ALT <5 <5  ALKPHOS 99 105  BILITOT 1.4* 1.4*  PROT 4.8* 4.8*  ALBUMIN  2.6* 2.4*    Medications:    Scheduled Medications:  feeding supplement (PROSource TF20)  60 mL Per Tube QID   insulin  aspart  0-9 Units Subcutaneous Q4H   metoCLOPramide  (REGLAN ) injection  5 mg Intravenous Q8H   mouth rinse  15 mL Mouth Rinse Q2H   pantoprazole  (PROTONIX ) IV  40 mg Intravenous QHS   polyethylene glycol  17 g Per Tube BID   senna-docusate  2 tablet Per Tube QHS   sodium chloride  flush  10-40 mL Intracatheter Q12H   sodium chloride  flush  10-40 mL Intracatheter Q12H   sodium chloride  HYPERTONIC  4 mL Nebulization Q4H   vancomycin  variable dose per unstable renal function (pharmacist dosing)   Does not apply See admin instructions    Infusions:  ceFEPime  (MAXIPIME ) IV Stopped (02/12/24 1747)   DOBUTamine  5 mcg/kg/min (02/13/24 0800)   feeding supplement (VITAL 1.5 CAL) 20 mL/hr at 02/13/24 0800   fentaNYL  infusion INTRAVENOUS 50 mcg/hr (02/13/24 0800)   fluconazole  (DIFLUCAN ) IV Stopped (02/12/24 1237)   norepinephrine  (LEVOPHED ) Adult infusion 1 mcg/min (02/13/24 0800)   potassium chloride  50 mL/hr at 02/13/24 0800   propofol  (DIPRIVAN ) infusion Stopped (02/12/24 0834)   vasopressin   Stopped (02/11/24 1009)    PRN Medications: albuterol , fentaNYL , mouth rinse, sodium chloride  flush, sodium chloride  flush  Assessment/Plan   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 Kerrin for Candida endocarditis. Pre-op L/RHC 01/04/24 with no CAD and normal L/R heart pressures.  - Failed initial Milrinone  wean. Eventually weaned off, unfortunately restarted 12/1 for low output.  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  -Weaning iNO  - CVP 13-16. Give 120 mg IV lasix  now then start lasix  drip at 10 mg per hour.  - Continue support with IV Norepi 1 + DBA 5 mcg. CO-OX 69% - Stop Norepi.   Acute hypoxic respiratory failure: Suspect component of infection/aspiration PNA given unilateral disease.  - Respiratory culture- Enterobacter, On cefepime , vancomycin , and fluconazole .   Coagulopathy/thrombocytopenia/ABLA anemia:  - Thrombocytopenia present,  45  - INR now back to normal, PTT not significantly elevated, no DIC - Hgb goal >8  Sick sinus - Off amiodarone  - Now with junctional rhythm in the 30s underneath - Continue DDD at 90 - suspect due to underlying swelling  Afib - Post operative - Hold off on heparin , PLTs 40s.  - No afib as on 12/6  AKI on CKD 3b - Scr baseline 1.4-1.5-->over the last few days ~ 2.7-2.8 -Follow closely.   Protein-calorie malnutrition - 12/9 started  trickle feeds today.   Epistaxis  - s/p bilateral nasal packing per ENT 12/1    - Off AC  Pressure Ulcer R Buttock, DTI Continue to reposition side to side.    Stop scheduled miralax . Change to as needed.   Length of stay:  57  Amy Clegg, NP  02/13/24, 8:28 AM  CRITICAL CARE Performed by: Greig Mosses   Total critical care time:10 Critical care time was exclusive of separately billable procedures and treating other patients.  Critical care was necessary to treat or prevent imminent or life-threatening deterioration.  Critical care was time spent personally by me on the following activities: development of treatment plan with patient and/or surrogate as well as nursing, discussions with consultants,  evaluation of patient's response to treatment, examination of patient, obtaining history from patient or surrogate, ordering and performing treatments and interventions, ordering and review of laboratory studies, ordering and review of radiographic studies, pulse oximetry and re-evaluation of patient's condition.  Patient seen with NP, I formulated the plan and agree with the above note.   Co-ox 69% today, A-V paced with underlying asystole.  She is on NE 1, dobutamine  5 with good BP today.  CVP 16, creatinine 2.78 => 2.82.  Platelets remain low, 41=>45.  She is on fluconazole  + cefepime  + vancomycin  for h/o candidemia and respiratory Enterobacter. iNO at 15 ppm.   General: NAD Neck: JVP 16 cm, no thyromegaly or thyroid  nodule.  Lungs: Clear to auscultation bilaterally with normal respiratory effort. CV: Nondisplaced PMI.  Heart regular S1/S2, no S3/S4, no murmur.  1+ edema to knees.  Abdomen: Soft, nontender, no hepatosplenomegaly, no distention.  Skin: Intact without lesions or rashes.  Neurologic: Not responsive to me.  Extremities: No clubbing or cyanosis.  HEENT: Normal.   Still no underlying rhythm when pacing paused, will ultimately need PPM.   Would wean off NE and continue dobutamine  today.  Needs diuresis, start Lasix  120 mg IV x 1 then 10 mg/hr and follow response, may add metolazone  later today.  Will have to follow renal function closely.   Discussed with CCM, would prefer to get her off cefepime   and onto perhaps Zosyn, will review with pharmacy.   Platelets remain low, not on heparin .  May be inflammatory vs due to cefepime .  Will wait for higher platelets before placing Cortrak.   CRITICAL CARE Performed by: Ezra Shuck  Total critical care time: 45 minutes  Critical care time was exclusive of separately billable procedures and treating other patients.  Critical care was necessary to treat or prevent imminent or life-threatening deterioration.  Critical care was time spent personally by me on the following activities: development of treatment plan with patient and/or surrogate as well as nursing, discussions with consultants, evaluation of patient's response to treatment, examination of patient, obtaining history from patient or surrogate, ordering and performing treatments and interventions, ordering and review of laboratory studies, ordering and review of radiographic studies, pulse oximetry and re-evaluation of patient's condition.  Ezra Shuck 02/13/2024 9:19 AM

## 2024-02-13 NOTE — Consult Note (Signed)
 WOC Nurse Consult Note:  WOC consult performed remotely utilizing imaging and chart review Reason for Consult: DTI to R buttock  Wound type: Deep tissue pressure injury to R buttocks Pressure Injury POA:No Measurement: see nursing flow sheets Wound bed: 100% dark purple maroon discoloration Drainage (amount, consistency, odor) see nursing flow sheets Periwound: intact Dressing procedure/placement/frequency:  Cleanse wound with NS, pat dry.  Apply Xeroform to wound bed and cover with silicone foam dressing.  Change daily.    WOC Nurse team will follow with you and see patient within 10 days for wound assessments.  Please notify WOC nurses of any acute changes in the wounds or any new areas of concern  Thank you,  Doyal Polite, MSN, RN, [facility] WOC Team [phone] (Available Mon-Fri 0700-1500)

## 2024-02-13 NOTE — Progress Notes (Signed)
 Daughter updated at bedside this afternoon.  Tracy SHAUNNA Gaskins, DO 02/13/24 3:04 PM [facility]  For contact information, see Amion. If no response to pager, please call PCCM consult pager. After hours, 7PM- 7AM, please call Elink.

## 2024-02-13 NOTE — Progress Notes (Signed)
 TCTS Evening Rounds:  Hemodynamics stable on dobut 5, NE 1.   AV paced 90.  Remains sedated on vent.   Lasix  drip started at 10 this morning.  UO 40/hr.  BMET    Component Value Date/Time   NA 142 02/13/2024 1618   NA 141 06/13/2023 0810   K 4.3 02/13/2024 1618   CL 111 02/13/2024 1536   CO2 18 (L) 02/13/2024 1536   GLUCOSE 188 (H) 02/13/2024 1536   BUN 74 (H) 02/13/2024 1536   BUN 22 06/13/2023 0810   CREATININE 2.61 (H) 02/13/2024 1536   CALCIUM  8.3 (L) 02/13/2024 1536   EGFR 97 06/13/2023 0810   GFRNONAA 19 (L) 02/13/2024 1536   Creat improved this afternoon so hopefully will start diuresing more.

## 2024-02-13 NOTE — Plan of Care (Signed)
°  Problem: Clinical Measurements: Goal: Cardiovascular complication will be avoided Outcome: Progressing   Problem: Clinical Measurements: Goal: Respiratory complications will improve Outcome: Progressing   Problem: Clinical Measurements: Goal: Diagnostic test results will improve Outcome: Progressing   Problem: Nutrition: Goal: Adequate nutrition will be maintained Outcome: Progressing

## 2024-02-13 NOTE — Progress Notes (Signed)
 Nitric dose decreased to 15 ppm.

## 2024-02-13 NOTE — Progress Notes (Addendum)
 [address] [employer] [address]             [phone]      4 Days Post-Op Procedure(s) (LRB): ECMO CANNULATION (N/A) Subjective: Intubated and sedated, sedation being weaned, patient starting to wake up. Not yet following commands.   Objective: Vital signs in last 24 hours: Temp:  [97.3 F (36.3 C)-98.6 F (37 C)] 98.4 F (36.9 C) (12/10 0700) Pulse Rate:  [87-94] 89 (12/10 0700) Cardiac Rhythm: A-V Sequential paced (12/10 0000) Resp:  [0-30] 23 (12/10 0700) BP: (113)/(65) 113/65 (12/09 1525) SpO2:  [91 %-99 %] 95 % (12/10 0700) Arterial Line BP: (95-158)/(58-86) 110/65 (12/10 0700) FiO2 (%):  [50 %-70 %] 50 % (12/10 0741) Weight:  [79.9 kg] 79.9 kg (12/10 0500)  Hemodynamic parameters for last 24 hours: PAP: (29-37)/(16-20) 37/20 CVP:  [10 mmHg-20 mmHg] 15 mmHg CO:  [2.1 L/min-2.6 L/min] 2.1 L/min CI:  [1.2 L/min/m2-1.5 L/min/m2] 1.3 L/min/m2  Intake/Output from previous day: 12/09 0701 - 12/10 0700 In: 1554.5 [I.V.:404.4; NG/GT:540; IV Piggyback:610.1] Out: 750 [Urine:690; Chest Tube:60] Intake/Output this shift: No intake/output data recorded.  General appearance: critically ill, lying in bed intubated and sedated Neurologic: sedated, opens eyes but does not follow commands Heart: regular rate and rhythm, paced Lungs: diminished bibasilar breath sounds Abdomen: slightly distended abdomen, hypoactive bowel sounds Extremities: edema BLE and BUE, unna boots in place Wound: Clean and dry dressings in place  Lab Results: Recent Labs    02/12/24 1346 02/13/24 0339  WBC 9.1 8.5  HGB 14.2 14.2  HCT 42.6 43.2  PLT 41* 45*   BMET:  Recent Labs    02/12/24 1346 02/13/24 0339  NA 138 140  K 4.2 3.2*  CL 107 107  CO2 23 21*  GLUCOSE 134* 127*  BUN 50* 67*  CREATININE 2.78* 2.82*  CALCIUM  7.9* 7.9*    PT/INR:  Recent Labs    02/13/24 0339  LABPROT 15.4*  INR 1.2   ABG    Component Value Date/Time   PHART 7.365  02/12/2024 0817   HCO3 21.4 02/12/2024 0817   TCO2 23 02/12/2024 0817   ACIDBASEDEF 4.0 (H) 02/12/2024 0817   O2SAT 69.2 02/13/2024 0555   CBG (last 3)  Recent Labs    02/12/24 1942 02/12/24 2325 02/13/24 0307  GLUCAP 152* 137* 113*    Assessment/Plan: S/P Procedure(s) (LRB): ECMO CANNULATION (N/A)  Recurrent epistaxis: ENT packed bilateral nares with surgicel. Off Eliquis  for now.   Neuro: Sedation is being weaned, opens eyes but does not follow commands yet. Hopefully will continue to wean and see if she can follow commands soon.   CV: Weaned off VV ECMO 12/08. Hx of postop afib. Junctional rhythm under the pacer, continue DDD pacing at 90 for now. Coox 69 this AM. CI 1.3. BP 113/65 this AM. Weaning NO this AM per PCCM. On 1mcg levo and dobutamine  5mcg/kg/min. Will likely need to be restarted on Eliquis  at some point prior to discharge once thrombocytopenia improved.   Pulm: Intubated, FiO2 50% with O2 sats in the 90s. PCCM to wean vent as tolerated. Right sided HAP, on Cefepime  and Vancomycin  x 7d. CXR yesterday with bibasilar atelectasis and patchy airspace disease in the right and left lung bases with probable layering effusions. Continue chest PT. Chest tube output 60cc/24hrs. Leave in place for now.   GI: Tolerating trickle tube feeds, some abdominal distension on exam. Will continue trickle feeds for now. +  BM 12/07.   Endo: No hx of DM. CBGs controlled on SSI.   Renal: Cr 2.82, trending up. UO 690cc/24hrs. Very edematous on exam, continue unna boots. May have some 3rd spacing. Wt -4lbs from yesterday. Likely will need diuresis today. K 3.2, supplement.   ID: No leukocytosis. Afebrile. BAL growing enterobacter, continue Cefepime  and Vancomycin . Continue Fluconazole  for candidemia/fungal endocarditis.   Thrombocytopenia: Plt 45,000 slightly improved from 41,000 yesterday. Closely monitor.   DVT Prophylaxis: Holding lovenox  due to thrombocytopenia. Continue SCDs   Dispo:  Continue ICU care   LOS: 42 days    [name] Bend, PA-C 02/13/2024  Patient seen and examined, agree with above Rounded with Dr. Gretta of CCM In junctional in high 40s under pacer Weaning off NO, dobutamine  at 5 and norepi 1 Vent down to 45% FiO2, but not yet ready to wean Left hand slightly cooler than right but brisk cap refill- agree with DC A line Continue antibiotics Continue diuresis Enterobacter from sputum  Elspeth C. Kerrin, MD [facility] [phone]

## 2024-02-13 NOTE — Progress Notes (Signed)
 Nitric dose decreased to 10 ppm.

## 2024-02-13 NOTE — Progress Notes (Signed)
 Nitric is weaned off at this time.

## 2024-02-13 NOTE — Progress Notes (Signed)
 Occupational Therapy Re-Eval Patient Details Name: Tracy Baker MRN: [id number] DOB: [birth year] Today's Date: 02/13/2024   History of present illness 68 y.o. female adm 12/29/2023 after leaving AMA from [facility] (where she was on vacation due to lack of formal transfer) where she was being treated for C. Albicans candidemia. 10/28 TEE with mitral valve vegetation. 11/3 teeth Extractions. 11/6 Mitral valve repair. Redo sternotomy with MVR on 12/5, developed R ventricular failure on 12/6 requiring ECMO cannulation. PMHx: thyroid  disease.   OT comments  Re-eval completed based on pt's change in medical status. Currently pt is intubated and in the process of weaning sedation. Pt opens her eyes in response to her name; eyes with a superior midline gaze, not tracking or fixating on stimulus. Pt unable to follow simple commands, but responds to noxious stimuli to all 4 extremities. D/t current level of sedation, pt total assist for ADLs, and total +2 for rolling at bed level for pericare. Given change in medical status, will re-place rehab consult, and recommending >3 hours of skilled therapy daily. In prior therapy session's pt was supervision level, and anticipate would improve well with intensive therapy. OT will continue to follow acutely.      If plan is discharge home, recommend the following:  Two people to help with walking and/or transfers;Two people to help with bathing/dressing/bathroom;Assistance with cooking/housework;Assistance with feeding;Assist for transportation   Equipment Recommendations  Tub/shower seat    Recommendations for Other Services Rehab consult    Precautions / Restrictions Precautions Precautions: Fall;Sternal Precaution Booklet Issued: No Recall of Precautions/Restrictions: Impaired Restrictions Weight Bearing Restrictions Per Provider Order: No Other Position/Activity Restrictions: Cardiac sternal precautions       Mobility Bed Mobility Overal bed mobility:  Needs Assistance Bed Mobility: Rolling Rolling: Total assist, +2 for physical assistance, +2 for safety/equipment         General bed mobility comments: Transitioned bed to egress. Pt unable to follow commands to assist with transitioning trunk off bed. Total +2 to roll for perihygiene from bed    Transfers   General transfer comment: Deferred d/t sedation     Balance Overall balance assessment: Needs assistance       ADL either performed or assessed with clinical judgement   ADL Overall ADL's : Needs assistance/impaired Eating/Feeding: NPO   Grooming: Total assistance;Bed level   Upper Body Bathing: Total assistance;Bed level   Lower Body Bathing: Total assistance;Bed level   Upper Body Dressing : Total assistance;Bed level   Lower Body Dressing: Total assistance;Bed level       Toileting- Clothing Manipulation and Hygiene: Total assistance;+2 for physical assistance;+2 for safety/equipment;Bed level         General ADL Comments: Pt total assist d/t level of sedation and muscle weakness. Will continue to evaluate as arousal improves    Extremity/Trunk Assessment Upper Extremity Assessment Upper Extremity Assessment: Generalized weakness (Pt not following any commands for formal testing. BUE flaccid)   Lower Extremity Assessment Lower Extremity Assessment: Defer to PT evaluation        Vision   Vision Assessment?: No apparent visual deficits Additional Comments: TBA as sedation is weaned         Communication Communication Communication: Impaired Factors Affecting Communication: Trach/intubated   Cognition Arousal: Obtunded Behavior During Therapy: Flat affect Cognition: Difficult to assess Difficult to assess due to: Intubated           OT - Cognition Comments: Pt intubated in process of being weaned off sedation. Will open eyes in response  to her name, not following any simple commands, only response to noxious stimuli       Following  commands: Impaired        Cueing   Cueing Techniques: Verbal cues        General Comments HDs stable on vent    Pertinent Vitals/ Pain       Pain Assessment Pain Assessment: Faces Faces Pain Scale: Hurts a little bit Pain Location: with noxious stimulli Pain Descriptors / Indicators: Discomfort Pain Intervention(s): Limited activity within patient's tolerance   Frequency  Min 2X/week        Progress Toward Goals  OT Goals(current goals can now be found in the care plan section)  Progress towards OT goals: Goals drowngraded-see care plan  Acute Rehab OT Goals Patient Stated Goal: None stated OT Goal Formulation: Patient unable to participate in goal setting Time For Goal Achievement: 02/27/24 Potential to Achieve Goals: Good ADL Goals Pt Will Perform Grooming: with supervision;standing Pt Will Perform Upper Body Bathing: with supervision;sitting Pt Will Perform Lower Body Bathing: with min assist;sit to/from stand Pt Will Perform Upper Body Dressing: with supervision;sitting Pt Will Perform Lower Body Dressing: with supervision;sit to/from stand;sitting/lateral leans Pt Will Transfer to Toilet: with supervision;ambulating;regular height toilet Pt Will Perform Tub/Shower Transfer: Tub transfer;Shower transfer;with supervision;ambulating;rolling walker Additional ADL Goal #1: pt will tolerate OOB standing activity x15 min in order to improve activity tolerance for ADLs/IADLs  Plan      Co-evaluation    PT/OT/SLP Co-Evaluation/Treatment: Yes Reason for Co-Treatment: To address functional/ADL transfers;For patient/therapist safety;Complexity of the patient's impairments (multi-system involvement)   OT goals addressed during session: ADL's and self-care;Strengthening/ROM      AM-PAC OT 6 Clicks Daily Activity     Outcome Measure   Help from another person eating meals?: Total Help from another person taking care of personal grooming?: Total Help from another  person toileting, which includes using toliet, bedpan, or urinal?: Total Help from another person bathing (including washing, rinsing, drying)?: Total Help from another person to put on and taking off regular upper body clothing?: Total Help from another person to put on and taking off regular lower body clothing?: Total 6 Click Score: 6    End of Session Equipment Utilized During Treatment: Oxygen  OT Visit Diagnosis: Unsteadiness on feet (R26.81);Muscle weakness (generalized) (M62.81)   Activity Tolerance Patient limited by lethargy   Patient Left in bed;with call bell/phone within reach;with nursing/sitter in room   Nurse Communication Mobility status        Time: 8867-8845 OT Time Calculation (min): 22 min  Charges: OT General Charges $OT Visit: 1 Visit OT Evaluation $OT Re-eval: 1 Re-eval  Adrianne BROCKS, OT  Acute Rehabilitation Services Office [phone] Secure chat preferred   Adrianne GORMAN Savers 02/13/2024, 12:56 PM

## 2024-02-13 NOTE — Progress Notes (Signed)
 eLink Physician-Brief Progress Note Patient Name: Kenyatta Gloeckner DOB: 1956/01/13 MRN: [id number]   Date of Service  02/13/2024  HPI/Events of Note  K+ 3.2, Cr 2.82, GFR 18.  eICU Interventions  KCL 10 meq iv Q 1 hour x 2 ordered.        Donnielle Addison U Jaxxon Naeem 02/13/2024, 5:01 AM

## 2024-02-13 NOTE — Evaluation (Signed)
 Physical Therapy Re-Evaluation Patient Details Name: Tracy Baker MRN: [id number] DOB: 02/13/1956 Today's Date: 02/13/2024  History of Present Illness  68 y.o. female adm 12/29/2023 after leaving AMA from [facility] (where she was on vacation due to lack of formal transfer) where she was being treated for C. Albicans candidemia. 10/28 TEE with mitral valve vegetation. 11/3 teeth Extractions. 11/6 Mitral valve repair. 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 PMHx: thyroid  disease.   Clinical Impression  The pt is now intubated and on 50 of fentanyl  per RN. She is obtunded, not following any commands and often just maintaining a superior midline gaze. She does roam her eyes intermittently but does not make eye contact. She withdraws/responds to noxious stimuli at all x4 extremities, but does not actively move limbs to assist with mobility, only intermittently spontaneously weakly moving them. She is currently requiring total assist x2 for all bed mobility and attempts at sitting up with the bed in egress position. She has had a drastic functional decline and a change in medical status. She could greatly benefit from intensive inpatient rehab, > 3 hours/day. Will continue to follow acutely.        If plan is discharge home, recommend the following: Assistance with cooking/housework;Assist for transportation;Help with stairs or ramp for entrance;Two people to help with walking and/or transfers;Two people to help with bathing/dressing/bathroom;Assistance with feeding;Direct supervision/assist for medications management;Direct supervision/assist for financial management   Can travel by private vehicle        Equipment Recommendations Other (comment) (TBD)  Recommendations for Other Services  Rehab consult    Functional Status Assessment Patient has had a recent decline in their functional status and demonstrates the ability to make  significant improvements in function in a reasonable and predictable amount of time.     Precautions / Restrictions Precautions Precautions: Fall;Sternal Precaution Booklet Issued: No Recall of Precautions/Restrictions: Impaired Precaution/Restrictions Comments: intubated; A-line; R chest tube; temp pacer; OG tube Restrictions Weight Bearing Restrictions Per Provider Order: No Other Position/Activity Restrictions: Cardiac sternal precautions      Mobility  Bed Mobility Overal bed mobility: Needs Assistance Bed Mobility: Rolling, Supine to Sit Rolling: Total assist, +2 for physical assistance, +2 for safety/equipment   Supine to sit: Total assist, +2 for physical assistance, +2 for safety/equipment, HOB elevated     General bed mobility comments: Transitioned bed to egress. Pt unable to follow commands to assist with transitioning trunk off bed. Total +2 to roll for perihygiene from bed    Transfers                   General transfer comment: Deferred d/t low level of arousal and not following commands    Ambulation/Gait               General Gait Details: deferred  Stairs            Wheelchair Mobility     Tilt Bed    Modified Rankin (Stroke Patients Only)       Balance Overall balance assessment: Needs assistance Sitting-balance support: No upper extremity supported, Feet unsupported Sitting balance-Leahy Scale: Zero Sitting balance - Comments: no core activation when cuing pt to sit up from bed in egress position, needing total assist x2 to do so       Standing balance comment: deferred  Pertinent Vitals/Pain Pain Assessment Pain Assessment: Faces Faces Pain Scale: Hurts a little bit Pain Location: with noxious stimulli Pain Descriptors / Indicators: Discomfort Pain Intervention(s): Limited activity within patient's tolerance, Monitored during session, Repositioned    Home Living  Family/patient expects to be discharged to:: Private residence Living Arrangements: Alone Available Help at Discharge: Family;Available 24 hours/day (dtr, [name] Jarvis, and son along with Aunt from [place] can provide 24/7) Type of Home: House Home Access: Stairs to enter Entrance Stairs-Rails: Left Entrance Stairs-Number of Steps: 5   Home Layout: One level Home Equipment: None      Prior Function Prior Level of Function : Independent/Modified Independent;Working/employed;Driving             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) Extremity Assessment: Defer to OT evaluation    Lower Extremity Assessment Lower Extremity Assessment: Generalized weakness;Difficult to assess due to impaired cognition;RLE deficits/detail;LLE deficits/detail RLE Deficits / Details: no active movement; did respond/withdraw to noxious stimuli; no clonus noted; ROM limited by pitting edema throughout LLE Deficits / Details: spontaneous slight knee extension movement noted; did respond/withdraw to noxious stimuli; no clonus noted; ROM limited by pitting edema throughout    Cervical / Trunk Assessment Cervical / Trunk Assessment: Other exceptions Cervical / Trunk Exceptions: pitting edema throughout  Communication   Communication Communication: Impaired Factors Affecting Communication: Trach/intubated    Cognition Arousal: Obtunded Behavior During Therapy: Flat affect   PT - Cognitive impairments: Difficult to assess                       PT - Cognition Comments: RN reports most sedation has been able to be discontinued but pt still on fentanyl  50. Pt with eyes superior and midline majority of time, only intermittently moving them slightly inferiorly and laterally when cued to look at therapist, but no eye contact made. Did not follow any cues. Following commands: Impaired (no command following this date)        Cueing Cueing Techniques: Verbal cues, Tactile cues     General Comments General comments (skin integrity, edema, etc.): VSS, intubated 50% FiO2 PEEP 8    Exercises     Assessment/Plan    PT Assessment Patient needs continued PT services  PT Problem List Decreased strength;Decreased activity tolerance;Decreased balance;Decreased mobility;Decreased knowledge of use of DME;Decreased cognition;Cardiopulmonary status limiting activity       PT Treatment Interventions DME instruction;Gait training;Stair training;Functional mobility training;Therapeutic activities;Therapeutic exercise;Balance training;Neuromuscular re-education;Cognitive remediation;Patient/family education    PT Goals (Current goals can be found in the Care Plan section)  Acute Rehab PT Goals Patient Stated Goal: unable to state PT Goal Formulation: Patient unable to participate in goal setting Time For Goal Achievement: 02/27/24 Potential to Achieve Goals: Fair    Frequency Min 3X/week     Co-evaluation   Reason for Co-Treatment: To address functional/ADL transfers;For patient/therapist safety;Complexity of the patient's impairments (multi-system involvement);Necessary to address cognition/behavior during functional activity PT goals addressed during session: Mobility/safety with mobility;Strengthening/ROM;Balance OT goals addressed during session: ADL's and self-care;Strengthening/ROM       AM-PAC PT 6 Clicks Mobility  Outcome Measure Help needed turning from your back to your side while in a flat bed without using bedrails?: Total Help needed moving from lying on your back to sitting on the side of a flat bed without using bedrails?: Total Help needed moving to and from a bed to a chair (including  a wheelchair)?: Total Help needed standing up from a chair using your arms (e.g., wheelchair or bedside chair)?: Total Help needed to walk in hospital room?: Total Help needed climbing 3-5 steps with a railing? :  Total 6 Click Score: 6    End of Session Equipment Utilized During Treatment: Oxygen Activity Tolerance: Patient limited by lethargy;Patient limited by fatigue Patient left: in bed;with call bell/phone within reach;with bed alarm set;with nursing/sitter in room Nurse Communication: Mobility status;Other (comment) (BM) PT Visit Diagnosis: Other abnormalities of gait and mobility (R26.89);Muscle weakness (generalized) (M62.81);Difficulty in walking, not elsewhere classified (R26.2)    Time: 8867-8845 PT Time Calculation (min) (ACUTE ONLY): 22 min   Charges:   PT Evaluation $PT Re-evaluation: 1 Re-eval   PT General Charges $$ ACUTE PT VISIT: 1 Visit         Theo Ferretti, PT, DPT Acute Rehabilitation Services  Office: [phone]   Theo CHRISTELLA Ferretti 02/13/2024, 2:00 PM

## 2024-02-13 NOTE — Progress Notes (Signed)
 Left hand cooler than R this morning; with active warming it is better but not symmetric. Earlier this week did not have a pulsatile radial artery but ulnar was patent. Axillary Aline to be d/c today.  Leita SHAUNNA Gaskins, DO 02/13/24 1:41 PM [facility]  For contact information, see Amion. If no response to pager, please call PCCM consult pager. After hours, 7PM- 7AM, please call Elink.

## 2024-02-13 NOTE — Progress Notes (Signed)
 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 Kerrin. PCCM consulted for ICU post-op vent management.  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 Kerrin 12/6: Overnight patient started requiring more vasopressor support, early morning she was on dobutamine  5, milrinone  0.375, epinephrine  at 10, vasopressin  0.04 and norepinephrine  34 mics, she is not making urine.  Cannulated with Protek duo for RV support,  12/7 bronch, broadened antibiotics 12/8 decannulated from VV ECMO after sweep and flow wean trials. Lasix  stopped. Restarted TF.  Interim History / Subjective:  Overnight no acute events. Tolerated TF at trickles. Had BM overnight.   Objective    Blood pressure 113/65, pulse 90, temperature 98.1 F (36.7 C), resp. rate (!) 21, height 5' 4 (1.626 m), weight 79.9 kg, SpO2 98%. PAP: (29-37)/(16-20) 37/20 CVP:  [10 mmHg-20 mmHg] 18 mmHg CO:  [2.1 L/min-2.6 L/min] 2.1 L/min CI:  [1.2 L/min/m2-1.5 L/min/m2] 1.3 L/min/m2  Vent Mode: PRVC FiO2 (%):  [70 %] 70 % Set Rate:  [22 bmp] 22 bmp Vt Set:  [440 mL] 440 mL PEEP:  [8 cmH20] 8 cmH20 Plateau Pressure:  [22 cmH20-28 cmH20] 22 cmH20   Intake/Output Summary (Last 24 hours) at 02/13/2024 0659 Last data filed at  02/13/2024 0600 Gross per 24 hour  Intake 1624.22 ml  Output 750 ml  Net 874.22 ml   Filed Weights   02/11/24 0500 02/12/24 0600 02/13/24 0500  Weight: 82.3 kg 81.8 kg 79.9 kg     Physical exam: General: critically ill appearing woman lying in bed intubated, lightly sedated  HEENT: [place]/AT, eyes anicteric Neck: LIJ CVC, R neck with skin breakdown from previous bandage Neuro:  RASS -3, opens eyes briefly to her name, but not following commands or tracking Chest: improving rhales on the R, breathing comfortably on MV Heart: S1S2, RRR, paced rhythm Abdomen: soft, mildly distended compared to yesterday.  UOP 690cc,  I/O +750cc net +4.2L for admission  Coox 69% 7.37/35/184/20.2 K+ 3.2 BUN 67 Cr 2.82 LDH 391 LA 1.2  H/H 14.2/43.2 Platelets 45 INR 1.2  CXR pending  Resp culture: enterobacter, pan-sensitive   Patient Lines/Drains/Airways Status     Active Line/Drains/Airways     Name Placement date Placement time Site Days   Arterial Line 02/11/24 Left Brachial 02/11/24  1000  Brachial  2   PICC Double Lumen 01/15/24 Right Basilic 35 cm 0 cm 01/15/24  8384  -- 29   NG/OG Vented/Dual Lumen 16 Fr. Oral 02/08/24  1333  Oral  5   Urethral Catheter [name], RN Latex;Straight-tip;Temperature probe 14 Fr. 02/08/24  0800  Latex;Straight-tip;Temperature probe  5   Y Chest Tube 1, 2, and 3 1 Right Pleural 28 Fr. 2 Left Pleural 28 Fr. 3 Anterior Mediastinal 36 Fr. 02/08/24  1255  -- 5   Airway 8 mm  02/08/24  0800  -- 5   Wound 01/16/24 0800 Traumatic Back Left;Upper 01/16/24  0800  Back  28   Wound 02/08/24 Surgical Closed Surgical Incision Sternum Mid 02/08/24  --  Sternum  5   Wound 02/12/24 1800 Pressure Injury Buttocks Right;Medial Deep Tissue Pressure Injury - Purple or maroon localized area of discolored intact skin or blood-filled blister due to damage of underlying soft tissue from pressure and/or shear. 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, with persistent severe mitral regurgitation and status post redo sternotomy and bioprosthetic MVR Previous candidemia Acute RV failure with cardiogenic shock status post Protek duo VV ECMO cannula on 12/6, decannulated 12/8 -wean off iNO today; repeat ABG after down to 10ppm -con't DBA per AHF; off NE -start lasix  gtt today -con't diflucan  -aspirin  daily -chest tubes per TCTS  Shock, mixed cardiogenic and distributive; improving -off NE & vasopressin  -DBA -con't antibiotics for pneumonia; can't vanc to complete 7 days after she required ECMO cannula manipulation  Acute respiratory failure with hypoxia RUL pneumonia- Enterobacter -LTVV; FiO2 decreased based on ABG -PAD protocol- goal RASS 0 to -1; only on fentanyl  currently. Weaning off. -VAP prevention protocol -daily SAT & SBT as appropriate -keep cefepime , watching mental status. Can switch to zosyn if needed, but would be more IVF   AKI on CKD stage IIIa, worsening -strict I/O -renally dose meds, avoid nephrotoxic meds -con't foley -diuresis  Hypoglycemia improved; now mild hyperglycemia on tF H/o prediabetes, A1c 6 -SSI PRN -cortrak once platelets are recovering> planning for 12/12  Ileus; no issues with constipation -con't reglan  5mg  TID to ensure she continues to tolerate TF -con't senna & miralax  -slow titration of TF  Acute blood loss anemia status post multiple PRBC transfusion Thrombocytopenia due to critical illness status post 2 unit platelets Coagulopathy, probably from liver congestion requiring FFP transfusion -transfuse for Hb <7 or hemodynamically significant bleeding -continuing to hold DVT prophylaxis for now while platelets remain low. -no need for transfusions acutely  Severe protein calorie malnutrition -increase TF to 30cc/h today; monitor for intolerance -con't reglan  until tolerating TF   Hyperbilirubinemia; unclear etiology,  stable  -monitor periodically  Deconditioning, prolonged course of illness leading up to this  -PT, OT -anticipate need for CIR  Pressure ulcer on buttocks -luckily weaning pressors and increasing nutrition -turns  Hypokalemia -replete, monitor  Labs:  CBC: Recent Labs  Lab 02/11/24 0509 02/11/24 0837 02/11/24 1528 02/11/24 1548 02/12/24 0410 02/12/24 0817 02/12/24 1346 02/13/24 0339  WBC 5.7  --  8.2  --  9.0  --  9.1 8.5  HGB 10.2*  9.9*   < > 14.3 15.0 14.1 13.6 14.2 14.2  HCT 31.2*  29.0*   < > 42.1 44.0 42.0 40.0 42.6 43.2  MCV 93.4  --  89.4  --  91.5  --  91.6 92.1  PLT 52*  --  51*  --  46*  --  41* 45*   < > = values in this interval not displayed.    Basic Metabolic Panel: Recent Labs  Lab 02/10/24 0511 02/10/24 0513 02/10/24 1710 02/10/24 1711 02/11/24 0509 02/11/24 0837 02/11/24 1528 02/11/24 1548 02/12/24 0410 02/12/24 0817 02/12/24 1346 02/13/24 0339  NA 141  142   < > 139   < > 139  140   < > 141 140 141 141  138 140  K 3.9  3.9   < > 4.2   < > 4.7  4.4   < > 4.2 4.1 3.9 3.8 4.2 3.2*  CL 105  104   < > 104  --  105  --  106  --  107  --  107 107  CO2 24  23   < > 23  --  22  --  21*  --  17*  --  23 21*  GLUCOSE 121*  122*   < > 107*  --  107*  --  103*  --  118*  --  134* 127*  BUN 26*  24*   < > 29*  --  34*  --  36*  --  45*  --  50* 67*  CREATININE 2.03*  2.08*   < > 2.26*  --  2.50*  --  2.70*  --  2.70*  --  2.78* 2.82*  CALCIUM  7.7*  8.0*   < > 7.4*  --  7.7*  --  7.8*  --  7.9*  --  7.9* 7.9*  MG 2.5*  --  2.3  --  2.4  --  2.3  --  2.4  --   --  2.3  PHOS 5.9*  5.9*  --   --   --   --   --  6.3*  --  6.9*  --   --  5.9*   < > = values in this interval not displayed.   GFR: Estimated Creatinine Clearance: 19.5 mL/min (A) (by C-G formula based on SCr of 2.82 mg/dL (H)). Recent Labs  Lab 02/10/24 1853 02/11/24 0509 02/11/24 0515 02/11/24 0838 02/11/24 1528 02/12/24 0410 02/12/24 0822 02/12/24 1346 02/13/24 0339   WBC  --    < >  --   --  8.2 9.0  --  9.1 8.5  LATICACIDVEN 1.1  --  1.3 1.2  --   --  1.2  --   --    < > = values in this interval not displayed.    Liver Function Tests: Recent Labs  Lab 02/10/24 0511 02/10/24 1710 02/11/24 0509 02/12/24 0410 02/13/24 0339  AST 76* 56* 52* 26 18  ALT 6 5 <5 <5 <5  ALKPHOS 67 83 86 99 105  BILITOT 1.2 1.1 1.4* 1.4* 1.4*  PROT 4.4* 4.7* 4.6* 4.8* 4.8*  ALBUMIN  3.1*  3.2* 3.0* 2.7* 2.6* 2.4*   ABG    Component Value Date/Time   PHART 7.365 02/12/2024 0817   PCO2ART 37.4 02/12/2024 0817   PO2ART 110 (H) 02/12/2024 0817   HCO3 21.4 02/12/2024 0817   TCO2 23 02/12/2024 0817   ACIDBASEDEF 4.0 (H) 02/12/2024 0817   O2SAT 69.2 02/13/2024 0555    Coagulation Profile: Recent Labs  Lab 02/10/24 0700 02/10/24 1239 02/11/24 0509 02/12/24 0410 02/13/24 0339  INR 5.3* 3.1* 1.6* 1.3* 1.2      CBG: Recent Labs  Lab 02/12/24 1149 02/12/24 1558 02/12/24 1942 02/12/24 2325 02/13/24 0307  GLUCAP 159* 123* 152* 137* 113*   This patient is critically ill with multiple organ system failure which requires frequent high complexity decision making, assessment, support, evaluation, and titration of therapies. This was completed through the application of advanced monitoring technologies and extensive interpretation of multiple databases. During this encounter critical care time was devoted to patient care services described in this note for 44 minutes.  Leita SHAUNNA Gaskins, DO 02/13/24 9:27 AM [facility]  For contact information, see Amion. If no response to pager, please call PCCM consult pager. After hours, 7PM- 7AM, please call Elink.

## 2024-02-14 ENCOUNTER — Inpatient Hospital Stay (HOSPITAL_COMMUNITY)

## 2024-02-14 DIAGNOSIS — R570 Cardiogenic shock: Secondary | ICD-10-CM | POA: Diagnosis not present

## 2024-02-14 DIAGNOSIS — B377 Candidal sepsis: Secondary | ICD-10-CM | POA: Diagnosis not present

## 2024-02-14 DIAGNOSIS — N183 Chronic kidney disease, stage 3 unspecified: Secondary | ICD-10-CM | POA: Diagnosis not present

## 2024-02-14 DIAGNOSIS — N179 Acute kidney failure, unspecified: Secondary | ICD-10-CM | POA: Diagnosis not present

## 2024-02-14 DIAGNOSIS — I340 Nonrheumatic mitral (valve) insufficiency: Secondary | ICD-10-CM | POA: Diagnosis not present

## 2024-02-14 DIAGNOSIS — J9601 Acute respiratory failure with hypoxia: Secondary | ICD-10-CM | POA: Diagnosis not present

## 2024-02-14 LAB — CBC
HCT: 42.4 % (ref 36.0–46.0)
Hemoglobin: 13.5 g/dL (ref 12.0–15.0)
MCH: 30.5 pg (ref 26.0–34.0)
MCHC: 31.8 g/dL (ref 30.0–36.0)
MCV: 95.9 fL (ref 80.0–100.0)
Platelets: 40 K/uL — ABNORMAL LOW (ref 150–400)
RBC: 4.42 MIL/uL (ref 3.87–5.11)
RDW: 20.4 % — ABNORMAL HIGH (ref 11.5–15.5)
WBC: 10.3 K/uL (ref 4.0–10.5)
nRBC: 0.2 % (ref 0.0–0.2)

## 2024-02-14 LAB — COOXEMETRY PANEL
Carboxyhemoglobin: 1 % (ref 0.5–1.5)
Carboxyhemoglobin: 1.1 % (ref 0.5–1.5)
Methemoglobin: <0.7 % (ref 0.0–1.5)
Methemoglobin: <0.7 % (ref 0.0–1.5)
O2 Saturation: 53.4 %
O2 Saturation: 54.1 %
Total hemoglobin: 13.8 g/dL (ref 12.0–16.0)
Total hemoglobin: 13.7 g/dL (ref 12.0–16.0)

## 2024-02-14 LAB — RENAL FUNCTION PANEL
Albumin: 2.4 g/dL — ABNORMAL LOW (ref 3.5–5.0)
Anion gap: 15 (ref 5–15)
BUN: 88 mg/dL — ABNORMAL HIGH (ref 8–23)
CO2: 22 mmol/L (ref 22–32)
Calcium: 8.5 mg/dL — ABNORMAL LOW (ref 8.9–10.3)
Chloride: 106 mmol/L (ref 98–111)
Creatinine, Ser: 2.33 mg/dL — ABNORMAL HIGH (ref 0.44–1.00)
GFR, Estimated: 22 mL/min — ABNORMAL LOW (ref >60)
Glucose, Bld: 149 mg/dL — ABNORMAL HIGH (ref 70–99)
Phosphorus: 5.1 mg/dL — ABNORMAL HIGH (ref 2.5–4.6)
Potassium: 3.9 mmol/L (ref 3.5–5.1)
Sodium: 143 mmol/L (ref 135–145)

## 2024-02-14 LAB — BASIC METABOLIC PANEL WITH GFR
Anion gap: 10 (ref 5–15)
BUN: 91 mg/dL — ABNORMAL HIGH (ref 8–23)
CO2: 24 mmol/L (ref 22–32)
Calcium: 8.8 mg/dL — ABNORMAL LOW (ref 8.9–10.3)
Chloride: 107 mmol/L (ref 98–111)
Creatinine, Ser: 2.37 mg/dL — ABNORMAL HIGH (ref 0.44–1.00)
GFR, Estimated: 22 mL/min — ABNORMAL LOW (ref >60)
Glucose, Bld: 165 mg/dL — ABNORMAL HIGH (ref 70–99)
Potassium: 4.1 mmol/L (ref 3.5–5.1)
Sodium: 141 mmol/L (ref 135–145)

## 2024-02-14 LAB — CULTURE, RESPIRATORY W GRAM STAIN

## 2024-02-14 LAB — VANCOMYCIN, RANDOM: Vancomycin Rm: 21 ug/mL

## 2024-02-14 LAB — GLUCOSE, CAPILLARY
Glucose-Capillary: 142 mg/dL — ABNORMAL HIGH (ref 70–99)
Glucose-Capillary: 166 mg/dL — ABNORMAL HIGH (ref 70–99)
Glucose-Capillary: 146 mg/dL — ABNORMAL HIGH (ref 70–99)
Glucose-Capillary: 152 mg/dL — ABNORMAL HIGH (ref 70–99)
Glucose-Capillary: 134 mg/dL — ABNORMAL HIGH (ref 70–99)
Glucose-Capillary: 133 mg/dL — ABNORMAL HIGH (ref 70–99)

## 2024-02-14 LAB — MAGNESIUM: Magnesium: 2.2 mg/dL (ref 1.7–2.4)

## 2024-02-14 MED ORDER — METOLAZONE 5 MG PO TABS
5.0000 mg | ORAL_TABLET | Freq: Once | ORAL | Status: AC
  Administered 2024-02-14: 5 mg
  Filled 2024-02-14: qty 1

## 2024-02-14 MED ORDER — POTASSIUM CHLORIDE 20 MEQ PO PACK
40.0000 meq | PACK | Freq: Once | ORAL | Status: AC
  Administered 2024-02-14: 40 meq
  Filled 2024-02-14: qty 2

## 2024-02-14 MED ORDER — GERHARDT'S BUTT CREAM
TOPICAL_CREAM | Freq: Two times a day (BID) | CUTANEOUS | Status: DC
  Administered 2024-02-16 – 2024-02-25 (×2): 1 via TOPICAL
  Filled 2024-02-14 (×3): qty 60

## 2024-02-14 MED ORDER — FUROSEMIDE 10 MG/ML IJ SOLN
120.0000 mg | Freq: Once | INTRAVENOUS | Status: AC
  Administered 2024-02-14: 120 mg via INTRAVENOUS
  Filled 2024-02-14: qty 10

## 2024-02-14 MED ORDER — DEXMEDETOMIDINE HCL IN NACL 400 MCG/100ML IV SOLN
0.0000 ug/kg/h | INTRAVENOUS | Status: DC
  Administered 2024-02-14 – 2024-02-15 (×2): 0.4 ug/kg/h via INTRAVENOUS
  Administered 2024-02-16: 0.9 ug/kg/h via INTRAVENOUS
  Administered 2024-02-16: 0.4 ug/kg/h via INTRAVENOUS
  Administered 2024-02-16 (×2): 0.6 ug/kg/h via INTRAVENOUS
  Administered 2024-02-17: 1.2 ug/kg/h via INTRAVENOUS
  Administered 2024-02-17: 0.8 ug/kg/h via INTRAVENOUS
  Administered 2024-02-17: 0.3 ug/kg/h via INTRAVENOUS
  Administered 2024-02-17: 0.8 ug/kg/h via INTRAVENOUS
  Administered 2024-02-18: 05:00:00 1 ug/kg/h via INTRAVENOUS
  Filled 2024-02-14 (×12): qty 100

## 2024-02-14 MED ORDER — SCOPOLAMINE 1 MG/3DAYS TD PT72
1.0000 | MEDICATED_PATCH | TRANSDERMAL | Status: DC
  Administered 2024-02-14 – 2024-02-29 (×6): 1 mg via TRANSDERMAL
  Filled 2024-02-14 (×6): qty 1

## 2024-02-14 NOTE — Progress Notes (Signed)
 CVP up to 18 this afternoon. Discussed with Dr. Rolan, will increase lasix  gtt to 30 mg/hr and repeat metolazone  this afternoon. BMET at 1600.   Beckey LITTIE Coe AGACNP-BC 02/14/2024

## 2024-02-14 NOTE — Progress Notes (Addendum)
 ET tube advanced 2 cm from 21 to 23 @ the lip per written order with RT and RN @ bedside. No complications noted.

## 2024-02-14 NOTE — Plan of Care (Signed)

## 2024-02-14 NOTE — Progress Notes (Signed)
 5 Days Post-Op Procedures (LRB): ECMO CANNULATION (N/A) Subjective: Intubated.  Opens eyes and will stick out tongue but not gripping   Objective: Vital signs in last 24 hours: Temp:  [98.4 F (36.9 C)-99.3 F (37.4 C)] 99.3 F (37.4 C) (12/11 0700) Pulse Rate:  [88-99] 90 (12/11 0700) Cardiac Rhythm: A-V Sequential paced (12/11 0400) Resp:  [0-32] 26 (12/11 0718) BP: (74-123)/(32-85) 105/66 (12/11 0700) SpO2:  [91 %-98 %] 96 % (12/11 0700) Arterial Line BP: (91-143)/(53-76) 111/67 (12/10 2030) FiO2 (%):  [40 %-50 %] 40 % (12/11 0729) Weight:  [83.1 kg] 83.1 kg (12/11 0458)  Hemodynamic parameters for last 24 hours: CVP:  [11 mmHg-28 mmHg] 21 mmHg  Intake/Output from previous day: 12/10 0701 - 12/11 0700 In: 1728.2 [I.V.:342; NG/GT:975; IV Piggyback:411.2] Out: 2090 [Urine:1910; Chest Tube:180] Intake/Output this shift: Total I/O In: 53.8 [I.V.:16.8; NG/GT:37] Out: -   General appearance: alert and no distress Neurologic: generalized weakness, lethargy Heart: regular rate and rhythm Lungs: coarse BS biilaterally Abdomen: soft  Lab Results: Recent Labs    02/13/24 0339 02/13/24 0741 02/13/24 1618 02/14/24 0339  WBC 8.5  --   --  10.3  HGB 14.2   < > 15.0 13.5  HCT 43.2   < > 44.0 42.4  PLT 45*  --   --  40*   < > = values in this interval not displayed.   BMET:  Recent Labs    02/13/24 1536 02/13/24 1618 02/14/24 0339  NA 143 142 143  K 4.4 4.3 3.9  CL 111  --  106  CO2 18*  --  22  GLUCOSE 188*  --  149*  BUN 74*  --  88*  CREATININE 2.61*  --  2.33*  CALCIUM  8.3*  --  8.5*    PT/INR:  Recent Labs    02/13/24 0339  LABPROT 15.4*  INR 1.2   ABG    Component Value Date/Time   PHART 7.340 (L) 02/13/2024 1618   HCO3 18.0 (L) 02/13/2024 1618   TCO2 19 (L) 02/13/2024 1618   ACIDBASEDEF 7.0 (H) 02/13/2024 1618   O2SAT 53.4 02/14/2024 0655   CBG (last 3)  Recent Labs    02/13/24 2313 02/14/24 0310 02/14/24 0750  GLUCAP 152* 142* 166*     Assessment/Plan: S/P Procedures (LRB): ECMO CANNULATION (N/A) - NEURO- sedation lightened, still very weak  CV- in SR vs junctional with PACs under pacer, rate in 70s  Continue DDD pacing for now  Co-ox 53 but all other indicators favorable  On dobutamine  5 norepi 1 RESP- VDRF, HAP  On 40% and 5 PEEP SBT underway  Vent per CCM, not sure ready to extubate from weakness standpoint  pleural tubes removed ID- on Vanco meropenem  RENAL- creatinine down significantly but BUN still rising  Responding better to diuretics  Needs additional diuresis ENDO- CBG mildly elevated GI- tolerating TF Thrombocytopenia- PLT 40K, no evidence of bleeding   LOS: 43 days    Tracy Baker 02/14/2024

## 2024-02-14 NOTE — Progress Notes (Addendum)
 Advanced Heart Failure Rounding Note  Cardiologist: Lonni Cash, MD  Chief Complaint: S/p MV Repair / MV endocarditis  Patient Profile   Tracy Baker is a 68 y.o. female with hx of recent nephrolithiasis s/p ureteral stent and lithotripsy at [facility] 10/10 with stent removal. 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 Post-op course c/b post cardiotomy shock, junctional bradycardia, afib w/ RVR and HAP.   Subjective:    RHC 12/2 (on 0.125 of Milrinone ): RA 4, PA 42/13 (22), PCW 12 with Vwaves to 20, CO/CI TD 3.37/2.03, PAPi 7.5  12/5 S/P Redo MVR - Intra Op given  4UPRBCs, 2UPLTs, 2FFP, 1Cyro  12/6 Protek Duo cannulation 12/8 Decannulated 12/9 Diuresed with IV lasix  120 mg x1.  12/10 lasix  gtt started.   CVP 13, on lasix  gtt @10 . -1.9L UOP.   Currently on Norepi 1, DBA 5. Off iNO.   CO-OX 53%   Intubated. Responds to voice, intt follows commands.   Objective:    Weight Range: 83.1 kg Body mass index is 31.45 kg/m.   Vital Signs:   Temp:  [98.4 F (36.9 C)-99.3 F (37.4 C)] 99.3 F (37.4 C) (12/11 0700) Pulse Rate:  [88-99] 90 (12/11 0700) Resp:  [0-32] 26 (12/11 0718) BP: (74-123)/(32-85) 105/66 (12/11 0700) SpO2:  [91 %-98 %] 96 % (12/11 0700) Arterial Line BP: (91-143)/(53-76) 111/67 (12/10 2030) FiO2 (%):  [40 %-50 %] 40 % (12/11 0729) Weight:  [83.1 kg] 83.1 kg (12/11 0458) Last BM Date : 02/14/24  Weight change: Filed Weights   02/12/24 0600 02/13/24 0500 02/14/24 0458  Weight: 81.8 kg 79.9 kg 83.1 kg   Intake/Output:  Intake/Output Summary (Last 24 hours) at 02/14/2024 0758 Last data filed at 02/14/2024 0717 Gross per 24 hour  Intake 1781.99 ml  Output 2090 ml  Net -308.01 ml   Physical Exam  General:  chronically ill appearing.  Intubated Neck: JVD ~13 cm.  Cor: Paced rate & rhythm.  Lungs: diminished Extremities: +1-2 BLE edema to thigh. + UNNA boots. PICC RUE. L hand cool to touch.  GU: +  foley Neuro: intubated. Follows some commands, responds to voice  Telemetry   Paced 90s, intt NSVT (Personally reviewed)    Labs    CBC Recent Labs    02/13/24 0339 02/13/24 0741 02/13/24 1618 02/14/24 0339  WBC 8.5  --   --  10.3  HGB 14.2   < > 15.0 13.5  HCT 43.2   < > 44.0 42.4  MCV 92.1  --   --  95.9  PLT 45*  --   --  40*   < > = values in this interval not displayed.   Basic Metabolic Panel Recent Labs    87/89/74 0339 02/13/24 0741 02/13/24 1536 02/13/24 1618 02/14/24 0339  NA 140   < > 143 142 143  K 3.2*   < > 4.4 4.3 3.9  CL 107  --  111  --  106  CO2 21*  --  18*  --  22  GLUCOSE 127*  --  188*  --  149*  BUN 67*  --  74*  --  88*  CREATININE 2.82*  --  2.61*  --  2.33*  CALCIUM  7.9*  --  8.3*  --  8.5*  MG 2.3  --   --   --  2.2  PHOS 5.9*  --   --   --  5.1*   < > =  values in this interval not displayed.   Liver Function Tests Recent Labs    02/12/24 0410 02/13/24 0339 02/14/24 0339  AST 26 18  --   ALT <5 <5  --   ALKPHOS 99 105  --   BILITOT 1.4* 1.4*  --   PROT 4.8* 4.8*  --   ALBUMIN  2.6* 2.4* 2.4*    Medications:    Scheduled Medications:  feeding supplement (PROSource TF20)  60 mL Per Tube QID   fentaNYL  (SUBLIMAZE ) injection  25 mcg Intravenous Once   insulin  aspart  0-9 Units Subcutaneous Q4H   metoCLOPramide  (REGLAN ) injection  5 mg Intravenous Q8H   mouth rinse  15 mL Mouth Rinse Q2H   pantoprazole  (PROTONIX ) IV  40 mg Intravenous QHS   promethazine   12.5 mg Per Tube Q8H   senna-docusate  2 tablet Per Tube QHS   sodium chloride  flush  10-40 mL Intracatheter Q12H   sodium chloride  flush  10-40 mL Intracatheter Q12H   sodium chloride  HYPERTONIC  4 mL Nebulization Q4H   vancomycin  variable dose per unstable renal function (pharmacist dosing)   Does not apply See admin instructions    Infusions:  ceFEPime  (MAXIPIME ) IV Stopped (02/13/24 1802)   DOBUTamine  5 mcg/kg/min (02/14/24 0717)   feeding supplement (VITAL 1.5 CAL)  Stopped (02/14/24 0714)   fentaNYL  infusion INTRAVENOUS Stopped (02/14/24 0710)   fluconazole  (DIFLUCAN ) IV Stopped (02/13/24 1200)   furosemide  (LASIX ) 200 mg in dextrose  5 % 100 mL (2 mg/mL) infusion 10 mg/hr (02/14/24 0717)   norepinephrine  (LEVOPHED ) Adult infusion 1 mcg/min (02/14/24 0717)   promethazine  (PHENERGAN ) injection (IM or IVPB)     vasopressin  Stopped (02/11/24 1009)    PRN Medications: albuterol , fentaNYL , mouth rinse, polyethylene glycol, promethazine  (PHENERGAN ) injection (IM or IVPB), sodium chloride  flush, sodium chloride  flush  Assessment/Plan   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 Kerrin for Candida endocarditis. Pre-op L/RHC 01/04/24 with no CAD and normal L/R heart pressures.  - Failed initial Milrinone  wean. Eventually weaned off, unfortunately restarted 12/1 for low output.  - TEE 11/18  with severe mitral regurgitation of repaired valve - S/P Redo MVR complicated by rapidly worsening RV failure - Protek Cannulation 12/6, Decannulated 12/8  - Respiratory culture- Enterobacter, On cefepime , vancomycin , and fluconazole .  -Weaning iNO  - CVP 13. Give 120 mg IV lasix  now then increase lasix  drip 10>20 mg per hour. Metolazone  5 mg x1.  - Continue support with IV Norepi 1 + DBA 5 mcg. CO-OX 53%  Acute hypoxic respiratory failure: Suspect component of infection/aspiration PNA given unilateral disease.  - Respiratory culture- Enterobacter, On cefepime , vancomycin , and fluconazole .   Coagulopathy/thrombocytopenia/ABLA anemia:  - Thrombocytopenia present,  40 - INR now back to normal, PTT not significantly elevated, no DIC - Hgb goal >8  Sick sinus - Off amiodarone  - Now with asystole underneath (as of 12/10) - Continue DDD at 90 - suspect due to underlying swelling  Afib - Post operative -  Hold off on heparin , PLTs 40s.  - No afib as on 12/6  AKI on CKD 3b - Scr baseline 1.4-1.5-->over the last few days ~ 2.7-2.8 - 2.3 today - Follow closely.   Protein-calorie malnutrition - 12/9 started trickle feeds. Now holding per PCCM.   Epistaxis  - s/p bilateral nasal packing per ENT 12/1    - Off [facility]  Pressure Ulcer R Buttock, DTI - Q2 turns, offload  CRITICAL CARE Performed by: Beckey LITTIE Coe   Total critical care time: 15 minutes  Critical care time was exclusive of separately billable procedures and treating other patients.  Critical care was necessary to treat or prevent imminent or life-threatening deterioration.  Critical care was time spent personally by me on the following activities: development of treatment plan with patient and/or surrogate as well as nursing, discussions with consultants, evaluation of patient's response to treatment, examination of patient, obtaining history from patient or surrogate, ordering and performing treatments and interventions, ordering and review of laboratory studies, ordering and review of radiographic studies, pulse oximetry and re-evaluation of patient's condition.   Length of stay: 36  Beckey LITTIE Coe, NP  02/14/2024, 7:58 AM  Patient seen with NP, I formulated the plan and agree with the above note.    Co-ox lower at 54% today, A-V paced with underlying asystole.  She is on NE 1, dobutamine  5 with good BP today.  CVP 13, creatinine 2.78 => 2.82 => 2.33.  Platelets remain low, 41=>45=>40.  She is on fluconazole  + cefepime  + vancomycin  for h/o candidemia and respiratory Enterobacter.    General: Intubated Neck: JVP 12 cm, no thyromegaly or thyroid  nodule.  Lungs: Decreased at bases.  CV: Nondisplaced PMI.  Heart regular S1/S2, no S3/S4, no murmur.  1+ ankle edema.  Abdomen: Soft, nontender, no hepatosplenomegaly, no distention.  Skin: Intact without lesions or rashes.  Neurologic: Wakes up and follows commands.  Extremities: No  clubbing or cyanosis.  HEENT: Normal.    Still no underlying rhythm when pacing paused, will ultimately need PPM.    Echo from 12/8 showed EF 50%, RV mildly dilated and mildly dysfunctional, s/p bioprosthetic mitral valve (stable).  With co-ox 54%, continue current dobutamine  and NE for RV support while diuresing.  Still volume overloaded, increase Lasix  to 20 mg/hr and follow response, will give metolazone  5 mg x 1 as well.  Will have to follow renal function closely but creatinine lower today at 2.33.    Continuing cefepime /vancomycin /fluconazole .    Platelets remain low, not on heparin .  May be inflammatory vs due to cefepime .    CRITICAL CARE Performed by: Ezra Shuck  Total critical care time: 40 minutes  Critical care time was exclusive of separately billable procedures and treating other patients.  Critical care was necessary to treat or prevent imminent or life-threatening deterioration.  Critical care was time spent personally by me on the following activities: development of treatment plan with patient and/or surrogate as well as nursing, discussions with consultants, evaluation of patient's response to treatment, examination of patient, obtaining history from patient or surrogate, ordering and performing treatments and interventions, ordering and review of laboratory studies, ordering and review of radiographic studies, pulse oximetry and re-evaluation of patient's condition.  Ezra Shuck 02/14/2024 10:57 AM

## 2024-02-14 NOTE — Progress Notes (Signed)
 Pharmacy Antibiotic Note  Tracy Baker is a 68 y.o. female admitted on 12/28/2023 with PA-ECMO.  Pharmacy has been consulted for merrem  and Vancomycin  dosing.  Cannulated with Protek Duo 12/6.  Variable renal function have been checking levels and redosing vancomycin   12/5 Vancomycin  1250 mg x 1 12/6 VR @1605  = 21 mcg/dL 87/2 VR @0955  = 19 mcg/dL 87/0 VR ~ 9589 = 16 mcg/dL > vancomycin  1gm at 10am 12/11 VR~ 0500 = 21 mcg/dl - will recheck level in am and redose then   Plan: Continue Fluconazole  IV 400 mg daily Continue Merrem  1g IV q12 Variable vancomycin  based on levels -  Planning total of 7d vancomycin  (through 12/11)  Height: 5' 4 (162.6 cm) Weight: 83.1 kg (183 lb 3.2 oz) IBW/kg (Calculated) : 54.7  Temp (24hrs), Avg:99.3 F (37.4 C), Min:98.6 F (37 C), Max:100.6 F (38.1 C)  Recent Labs  Lab 02/10/24 0846 02/10/24 0955 02/10/24 1853 02/11/24 0509 02/11/24 0515 02/11/24 0838 02/11/24 1528 02/12/24 0410 02/12/24 0822 02/12/24 1346 02/13/24 0339 02/13/24 1536 02/14/24 0339  WBC  --    < >  --    < >  --   --  8.2 9.0  --  9.1 8.5  --  10.3  CREATININE  --    < >  --    < >  --   --  2.70* 2.70*  --  2.78* 2.82* 2.61* 2.33*  LATICACIDVEN 1.0  --  1.1  --  1.3 1.2  --   --  1.2  --   --   --   --   VANCORANDOM  --    < >  --   --   --   --   --  16  --   --   --   --  21   < > = values in this interval not displayed.    Estimated Creatinine Clearance: 24.1 mL/min (A) (by C-G formula based on SCr of 2.33 mg/dL (H)).    Allergies  Allergen Reactions   Codeine Anaphylaxis and Nausea And Vomiting   Bactoshield Chg [Chlorhexidine  Gluconate] Dermatitis   Red Dye #40 (Allura Red) Nausea Only    Antimicrobials this admission: Zosyn 10/19>10/20 CRO 10/20 >>10/22 Azithro 10/22 >> 10/24 Fluconazole  10/19 >> 10/25 Micafungin  10/25 >> 10/11 Fluconazole  11/11 >> [12/18] Linezolid  11/13 >> 11/17 Cefepime  11/13 >> 11/17 Vanc 12/5>> Merrem   12/6>>   Microbiology results: 10/18 Bcx: 1/2 Candida albicans  10/20 BCx: budding yeasts  10/22, 10/24, 10/25 Bcx: all neg 10/18 UCx: budding yeasts  12/7 BAL - GNR- suscp pending    Olam Chalk Pharm.D. CPP, BCPS Clinical Pharmacist [phone] 02/14/2024 3:18 PM   [facility] pharmacy phone numbers are listed on [email or web address]

## 2024-02-14 NOTE — Progress Notes (Signed)
 NAME:  Tracy Baker, MRN:  [id number], DOB:  05-23-1955, LOS: 43 ADMISSION DATE:  12/28/2023, CONSULTATION DATE:  02/08/24 REFERRING MD:  Kerrin RAMAN., CHIEF COMPLAINT:  s/p MVR   History of Present Illness:  Tracy Baker is a 68 year old female with past medical history significant for nephrolithiasis s/p ureteral stent and lithotripsy at [facility] 10/10, with stent removal, severe MR, MV repair (01/10/24) due to MV Candida endocarditis c/b continued severe MR, who presents today for a redo sternotomy and MVR w/ Dr. Kerrin. PCCM consulted for ICU post-op vent management.  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 Kerrin 12/6: Overnight patient started requiring more vasopressor support, early morning she was on dobutamine  5, milrinone  0.375, epinephrine  at 10, vasopressin  0.04 and norepinephrine  34 mics, she is not making urine.  Cannulated with Protek duo for RV support,  12/7 bronch, broadened antibiotics 12/8 decannulated from VV ECMO after sweep and flow wean trials. Lasix  stopped. Restarted TF. 12/10 chest tubes out, off pressors. Aline removed.  Interim History / Subjective:  Overnight no acute events. No concerns about TF intolerance. On fentanyl  50mcg only for sedation.  Objective    Blood pressure 97/64, pulse 89, temperature 99.7 F (37.6 C), resp. rate (!) 23, height 5' 4 (1.626 m), weight 83.1 kg, SpO2 90%. CVP:  [10 mmHg-25 mmHg] 12 mmHg  Vent Mode: PSV;CPAP FiO2 (%):  [40 %-50 %] 40 % Set Rate:  [22 bmp] 22 bmp Vt Set:  [440 mL] 440 mL PEEP:  [5 cmH20-8 cmH20] 5 cmH20 Pressure Support:  [8 cmH20] 8 cmH20 Plateau Pressure:  [18 cmH20-22 cmH20] 18 cmH20   Intake/Output Summary (Last 24 hours) at 02/14/2024 0947 Last  data filed at 02/14/2024 0900 Gross per 24 hour  Intake 1662.19 ml  Output 2220 ml  Net -557.81 ml   Filed Weights   02/12/24 0600 02/13/24 0500 02/14/24 0458  Weight: 81.8 kg 79.9 kg 83.1 kg     Physical exam: General: critically ill appearing woman lying in bed intubated, lightly sedated HEENT: [place]/AT, eyes anicteric, ETT in place Neck: R neck skin breakdown from previous internal jugular site Neuro:  RASS -3, not tracking, follows commands to wiggle toes, squeezed R hand on command. Weak cough. Chest: thinner tan secretions from ETT, improving R rhales. Tolerating PS 8 with Vt in 300s Heart: S1S2, RRR- paced Abdomen: soft, NT Extremities: Unna boots, + edema. Left hand still cooler, slightly dusky with delayed cap refill on dorsal side.   UOP 1910cc,  I/O -360cc, net +4L for admission  Coox 54% K+ 3.9 BUN 88 Cr 2.33 WBC 10.3 H/H 13.5/ 42.4 Platelets 40  CXR ETT at level of clavicles, OGT, LIJ CVC, improving R infiltrate KUB: distal OGT in proximal stomach, side port in distal esophagus  Resp culture: enterobacter, pan-sensitive   Patient Lines/Drains/Airways Status     Active Line/Drains/Airways     Name Placement date Placement time Site Days   PICC Double Lumen 01/15/24 Right Basilic 35 cm 0 cm 01/15/24  8384  -- 30   NG/OG Vented/Dual Lumen 16 Fr. Oral 02/08/24  1333  Oral  6   Urethral Catheter [name], RN Latex;Straight-tip;Temperature probe 14 Fr. 02/08/24  0800  Latex;Straight-tip;Temperature probe  6   Airway 8 mm 02/08/24  0800  -- 6   Wound 01/16/24 0800 Traumatic Back Left;Upper 01/16/24  0800  Back  29   Wound 02/08/24 Surgical Closed Surgical Incision Sternum Mid 02/08/24  --  Sternum  6   Wound 02/12/24 1800 Pressure Injury Buttocks Right;Medial Deep Tissue Pressure Injury - Purple or maroon localized area of discolored intact skin or blood-filled blister due to damage of underlying soft tissue from pressure and/or shear. 02/12/24  1800  Buttocks  2    Wound 02/13/24 1900 Irritant Contact Dermatitis Neck Right 02/13/24  1900  Neck  1           Resolved Problem List:   Assessment and Plan:  MV endocarditis s/p MV repair 01/10/2024, with persistent severe mitral regurgitation and status post redo sternotomy and bioprosthetic MVR Previous candidemia Acute RV failure with cardiogenic shock status post Protek duo VV ECMO cannula on 12/6, decannulated 12/8 -off iNO today -con't DBA -increase lasix  today -daily aspirin  -con't diflucan   Shock, mixed cardiogenic and distributive; improving -off NE & vaso -con't DBA, daily coox -con't vanc & cefepime  to complete 7 days  Acute respiratory failure with hypoxia RUL pneumonia- Enterobacter -LTVV -VAP prevention protocol -PAD protocol for sedation; goal RASS 0 to -1. Needs to wake up more to get extubated.  -daily SAT & SBT as appropriate. Worry about weak cough. -complete 7 days of cefepime ; zosyn won't cover Enterobacter  AKI on CKD stage IIIa, worsening -strict I/O -renally dose meds, avoid nephrotoxic meds -con't foley  -increase lasix  gtt, con't metolazone  today -monitor electrolytes  Hypoglycemia improved; now mild hyperglycemia on tF H/o prediabetes, A1c 6 -SSI PRN -goal BG 140-180 -cortrak tomorrow  Ileus; no issues with constipation -con't reglan  5mg  TID to ensure she continues to tolerate TF -can deescalate bowel regimen -hold TF while doing more aggressive wake up assessment today due to concern for vomiting -advancing OGT 10 cm  Acute blood loss anemia status post multiple PRBC transfusion Thrombocytopenia due to critical illness status post 2 unit platelets Coagulopathy, probably from liver congestion requiring FFP transfusion -tranfsuse for Hb <7 or hemodynamically significant bleeding -con't holding DVT prophylaxis -not needing transfusion  Severe protein calorie malnutrition -restart TF later if tolerating being awake or if resedated -cortrak  tomorrow -NGT to suction if she is frequently gagging  Hyperbilirubinemia; unclear etiology, stable  -monitor periodically  Deconditioning, prolonged course of illness leading up to this  -PT, OT -ancipitate the need for CIR  Pressure ulcer on buttocks; skin breakdown from bandage on R neck -working on improving nutrition -off pressors -turns -not sure, but worry about chlorhexidine -- added to allergy list  Hypokalemia, improved -monitor -empiric K+ repletion with increased lasix   Left hand cool; had nonpulsatile left radial on US  previously but intact ulnar. -US  duplex left  Labs:  CBC: Recent Labs  Lab 02/11/24 1528 02/11/24 1548 02/12/24 0410 02/12/24 0817 02/12/24 1346 02/13/24 0339 02/13/24 0741 02/13/24 1007 02/13/24 1119 02/13/24 1618 02/14/24 0339  WBC 8.2  --  9.0  --  9.1 8.5  --   --   --   --  10.3  HGB 14.3   < > 14.1   < > 14.2 14.2 14.3 14.3 13.6 15.0 13.5  HCT 42.1   < > 42.0   < > 42.6 43.2 42.0 42.0 40.0 44.0 42.4  MCV 89.4  --  91.5  --  91.6 92.1  --   --   --   --  95.9  PLT 51*  --  46*  --  41* 45*  --   --   --   --  40*   < > = values in this interval not displayed.    Basic Metabolic Panel: Recent Labs  Lab 02/10/24 0511 02/10/24 0513 02/11/24 0509 02/11/24 0837 02/11/24 1528 02/11/24 1548 02/12/24 0410 02/12/24 0817 02/12/24 1346 02/13/24 0339 02/13/24 0741 02/13/24 1007 02/13/24 1119 02/13/24 1536 02/13/24 1618 02/14/24 0339  NA 141  142   < > 139  140   < > 141   < > 141   < > 138 140   < > 143 142 143 142 143  K 3.9  3.9   < > 4.7  4.4   < > 4.2   < > 3.9   < > 4.2 3.2*   < > 3.9 3.6 4.4 4.3 3.9  CL 105  104   < > 105  --  106  --  107  --  107 107  --   --   --  111  --  106  CO2 24  23   < > 22  --  21*  --  17*  --  23 21*  --   --   --  18*  --  22  GLUCOSE 121*  122*   < > 107*  --  103*  --  118*  --  134* 127*  --   --   --  188*  --  149*  BUN 26*  24*   < > 34*  --  36*  --  45*  --  50* 67*  --   --    --  74*  --  88*  CREATININE 2.03*  2.08*   < > 2.50*  --  2.70*  --  2.70*  --  2.78* 2.82*  --   --   --  2.61*  --  2.33*  CALCIUM  7.7*  8.0*   < > 7.7*  --  7.8*  --  7.9*  --  7.9* 7.9*  --   --   --  8.3*  --  8.5*  MG 2.5*   < > 2.4  --  2.3  --  2.4  --   --  2.3  --   --   --   --   --  2.2  PHOS 5.9*  5.9*  --   --   --  6.3*  --  6.9*  --   --  5.9*  --   --   --   --   --  5.1*   < > = values in this interval not displayed.   GFR: Estimated Creatinine Clearance: 24.1 mL/min (A) (by C-G formula based on SCr of 2.33 mg/dL (H)). Recent Labs  Lab 02/10/24 1853 02/11/24 0509 02/11/24 0515 02/11/24 9161 02/11/24 1528 02/12/24 0410 02/12/24 0822 02/12/24 1346 02/13/24 0339 02/14/24 0339  WBC  --    < >  --   --    < > 9.0  --  9.1 8.5 10.3  LATICACIDVEN 1.1  --  1.3 1.2  --   --  1.2  --   --   --    < > = values in this interval not displayed.    Liver Function Tests: Recent Labs  Lab 02/10/24 0511 02/10/24 1710 02/11/24 0509 02/12/24 0410 02/13/24 0339 02/14/24 0339  AST 76* 56* 52* 26 18  --   ALT 6  5 <5 <5 <5  --   ALKPHOS 67 83 86 99 105  --   BILITOT 1.2 1.1 1.4* 1.4* 1.4*  --   PROT 4.4* 4.7* 4.6* 4.8* 4.8*  --   ALBUMIN  3.1*  3.2* 3.0* 2.7* 2.6* 2.4* 2.4*   ABG    Component Value Date/Time   PHART 7.340 (L) 02/13/2024 1618   PCO2ART 33.4 02/13/2024 1618   PO2ART 89 02/13/2024 1618   HCO3 18.0 (L) 02/13/2024 1618   TCO2 19 (L) 02/13/2024 1618   ACIDBASEDEF 7.0 (H) 02/13/2024 1618   O2SAT 54.1 02/14/2024 0855    Coagulation Profile: Recent Labs  Lab 02/10/24 0700 02/10/24 1239 02/11/24 0509 02/12/24 0410 02/13/24 0339  INR 5.3* 3.1* 1.6* 1.3* 1.2      CBG: Recent Labs  Lab 02/13/24 1543 02/13/24 2024 02/13/24 2313 02/14/24 0310 02/14/24 0750  GLUCAP 183* 148* 152* 142* 166*   This patient is critically ill with multiple organ system failure which requires frequent high complexity decision making, assessment, support,  evaluation, and titration of therapies. This was completed through the application of advanced monitoring technologies and extensive interpretation of multiple databases. During this encounter critical care time was devoted to patient care services described in this note for 45 minutes.  Leita SHAUNNA Gaskins, DO 02/14/2024 9:58 AM [facility]  For contact information, see Amion. If no response to pager, please call PCCM consult pager. After hours, 7PM- 7AM, please call Elink.

## 2024-02-14 NOTE — Progress Notes (Signed)
 TCTS PM Rounding Progress Note  Following some commands, still very weak, able to squeeze right hand to command Left hand mottled but unchanged from this morning; duplex read pending CVP uptrending to 18 this afternoon - extra lasix  and metolazone  given per HF team   Vitals:   02/14/24 1445 02/14/24 1500  BP: 99/77 109/66  Pulse: 89 88  Resp: (!) 21 20  Temp: (!) 100.4 F (38 C) (!) 100.6 F (38.1 C)  SpO2: 95% 96%    Plan: - Hot pack to left hand - f/u BMET at 4 pm - Continue current care  Con Clunes, MD Cardiothoracic Surgery Pager: [phone]

## 2024-02-14 NOTE — Consult Note (Signed)
 WOC Nurse Consult Note:  WOC consult performed remotely utilizing imaging and chart review  Reason for Consult:right neck rash/ dermatitis Wound type: full and partial thickness wound to R neck at site ? Etiology, s/p ECMo cannulation at this site, chart review shows history of CHG allergy causing dermatitis, wound is inconsistent with pressure Pressure Injury POA: NA- not pressure Measurement: see nursing flowsheets Wound bed:50 % red, 20% dry scabs, 10 % yellow slough scattered through out wound, 20 % raised intact yellow pustules Drainage (amount, consistency, odor) see nursing flow sheets Periwound: intact Dressing procedure/placement/frequency:   Cleanse wound with Vashe (lawson # [id number]) allow to air dry.  Place Xeroform over wound bed and cover with silicone foam dressing.  Change daily.      WOC team will not follow patient at this time, please re consult if new needs arise or wound deteriorates.  Thank you,  Doyal Polite, MSN, RN, [facility] WOC Team [phone] (Available Mon-Fri 0700-1500)

## 2024-02-15 ENCOUNTER — Inpatient Hospital Stay (HOSPITAL_COMMUNITY)

## 2024-02-15 DIAGNOSIS — R570 Cardiogenic shock: Secondary | ICD-10-CM | POA: Diagnosis not present

## 2024-02-15 DIAGNOSIS — J9601 Acute respiratory failure with hypoxia: Secondary | ICD-10-CM | POA: Diagnosis not present

## 2024-02-15 DIAGNOSIS — N183 Chronic kidney disease, stage 3 unspecified: Secondary | ICD-10-CM | POA: Diagnosis not present

## 2024-02-15 DIAGNOSIS — B377 Candidal sepsis: Secondary | ICD-10-CM | POA: Diagnosis not present

## 2024-02-15 DIAGNOSIS — N179 Acute kidney failure, unspecified: Secondary | ICD-10-CM | POA: Diagnosis not present

## 2024-02-15 DIAGNOSIS — I340 Nonrheumatic mitral (valve) insufficiency: Secondary | ICD-10-CM | POA: Diagnosis not present

## 2024-02-15 LAB — BASIC METABOLIC PANEL WITH GFR
Anion gap: 11 (ref 5–15)
BUN: 101 mg/dL — ABNORMAL HIGH (ref 8–23)
CO2: 23 mmol/L (ref 22–32)
Calcium: 8.7 mg/dL — ABNORMAL LOW (ref 8.9–10.3)
Chloride: 109 mmol/L (ref 98–111)
Creatinine, Ser: 2.16 mg/dL — ABNORMAL HIGH (ref 0.44–1.00)
GFR, Estimated: 24 mL/min — ABNORMAL LOW (ref >60)
Glucose, Bld: 193 mg/dL — ABNORMAL HIGH (ref 70–99)
Potassium: 5.2 mmol/L — ABNORMAL HIGH (ref 3.5–5.1)
Sodium: 143 mmol/L (ref 135–145)

## 2024-02-15 LAB — CBC
HCT: 39.6 % (ref 36.0–46.0)
Hemoglobin: 12.8 g/dL (ref 12.0–15.0)
MCH: 30.7 pg (ref 26.0–34.0)
MCHC: 32.3 g/dL (ref 30.0–36.0)
MCV: 95 fL (ref 80.0–100.0)
Platelets: 59 K/uL — ABNORMAL LOW (ref 150–400)
RBC: 4.17 MIL/uL (ref 3.87–5.11)
RDW: 20.1 % — ABNORMAL HIGH (ref 11.5–15.5)
WBC: 9.3 K/uL (ref 4.0–10.5)
nRBC: 0.4 % — ABNORMAL HIGH (ref 0.0–0.2)

## 2024-02-15 LAB — TYPE AND SCREEN
ABO/RH(D): A POS
Antibody Screen: NEGATIVE
Unit division: 0
Unit division: 0
Unit division: 0
Unit division: 0
Unit division: 0
Unit division: 0

## 2024-02-15 LAB — BPAM RBC
Blood Product Expiration Date: 202512242359
Blood Product Expiration Date: 202512272359
Blood Product Expiration Date: 202512272359
Blood Product Expiration Date: 202512272359
Blood Product Expiration Date: 202512282359
Blood Product Expiration Date: 202512282359
ISSUE DATE / TIME: 202512081131
ISSUE DATE / TIME: 202512081131
Unit Type and Rh: 6200
Unit Type and Rh: 6200
Unit Type and Rh: 6200
Unit Type and Rh: 6200
Unit Type and Rh: 6200
Unit Type and Rh: 6200

## 2024-02-15 LAB — RENAL FUNCTION PANEL
Albumin: 2.3 g/dL — ABNORMAL LOW (ref 3.5–5.0)
Anion gap: 12 (ref 5–15)
BUN: 97 mg/dL — ABNORMAL HIGH (ref 8–23)
CO2: 24 mmol/L (ref 22–32)
Calcium: 8.5 mg/dL — ABNORMAL LOW (ref 8.9–10.3)
Chloride: 106 mmol/L (ref 98–111)
Creatinine, Ser: 2.25 mg/dL — ABNORMAL HIGH (ref 0.44–1.00)
GFR, Estimated: 23 mL/min — ABNORMAL LOW (ref >60)
Glucose, Bld: 174 mg/dL — ABNORMAL HIGH (ref 70–99)
Phosphorus: 4.8 mg/dL — ABNORMAL HIGH (ref 2.5–4.6)
Potassium: 3.3 mmol/L — ABNORMAL LOW (ref 3.5–5.1)
Sodium: 142 mmol/L (ref 135–145)

## 2024-02-15 LAB — COOXEMETRY PANEL
Carboxyhemoglobin: 1.2 % (ref 0.5–1.5)
Methemoglobin: 0.7 % (ref 0.0–1.5)
O2 Saturation: 61.5 %
Total hemoglobin: 13.4 g/dL (ref 12.0–16.0)

## 2024-02-15 LAB — GLUCOSE, CAPILLARY
Glucose-Capillary: 115 mg/dL — ABNORMAL HIGH (ref 70–99)
Glucose-Capillary: 147 mg/dL — ABNORMAL HIGH (ref 70–99)
Glucose-Capillary: 148 mg/dL — ABNORMAL HIGH (ref 70–99)
Glucose-Capillary: 169 mg/dL — ABNORMAL HIGH (ref 70–99)
Glucose-Capillary: 170 mg/dL — ABNORMAL HIGH (ref 70–99)
Glucose-Capillary: 207 mg/dL — ABNORMAL HIGH (ref 70–99)

## 2024-02-15 LAB — VANCOMYCIN, RANDOM: Vancomycin Rm: 19 ug/mL

## 2024-02-15 LAB — AMMONIA: Ammonia: 27 umol/L (ref 9–35)

## 2024-02-15 LAB — MAGNESIUM: Magnesium: 2.1 mg/dL (ref 1.7–2.4)

## 2024-02-15 MED ORDER — BANATROL TF EN LIQD
60.0000 mL | Freq: Two times a day (BID) | ENTERAL | Status: DC
  Administered 2024-02-15 – 2024-02-28 (×27): 60 mL
  Filled 2024-02-15 (×28): qty 60

## 2024-02-15 MED ORDER — CHLORHEXIDINE GLUCONATE CLOTH 2 % EX PADS
6.0000 | MEDICATED_PAD | Freq: Every day | CUTANEOUS | Status: DC
  Administered 2024-02-15: 6 via TOPICAL

## 2024-02-15 MED ORDER — METOLAZONE 5 MG PO TABS
5.0000 mg | ORAL_TABLET | Freq: Once | ORAL | Status: AC
  Administered 2024-02-15: 5 mg
  Filled 2024-02-15: qty 1

## 2024-02-15 MED ORDER — SODIUM CHLORIDE 0.9% FLUSH: 10.0000 mL | INTRAVENOUS | Status: DC | PRN

## 2024-02-15 MED ORDER — POTASSIUM CHLORIDE 20 MEQ PO PACK
20.0000 meq | PACK | ORAL | Status: DC
  Administered 2024-02-15: 20 meq
  Filled 2024-02-15: qty 1

## 2024-02-15 MED ORDER — SODIUM CHLORIDE 0.9% FLUSH: 10.0000 mL | Freq: Two times a day (BID) | INTRAVENOUS | Status: DC

## 2024-02-15 MED ORDER — VANCOMYCIN HCL 750 MG/150ML IV SOLN
750.0000 mg | Freq: Once | INTRAVENOUS | Status: AC
  Administered 2024-02-15: 750 mg via INTRAVENOUS
  Filled 2024-02-15: qty 150

## 2024-02-15 MED ORDER — FENTANYL CITRATE (PF) 50 MCG/ML IJ SOSY
25.0000 ug | PREFILLED_SYRINGE | INTRAMUSCULAR | Status: AC
  Administered 2024-02-15 (×2): 25 ug via INTRAVENOUS
  Filled 2024-02-15: qty 1

## 2024-02-15 MED ORDER — ACETAZOLAMIDE 250 MG PO TABS
250.0000 mg | ORAL_TABLET | Freq: Once | ORAL | Status: AC
  Administered 2024-02-15: 250 mg
  Filled 2024-02-15: qty 1

## 2024-02-15 MED ORDER — ACETAZOLAMIDE 250 MG PO TABS
500.0000 mg | ORAL_TABLET | Freq: Once | ORAL | Status: AC
  Administered 2024-02-15: 500 mg
  Filled 2024-02-15: qty 2

## 2024-02-15 MED ORDER — VITAL 1.5 CAL PO LIQD
1000.0000 mL | ORAL | Status: DC
  Administered 2024-02-15: 1000 mL

## 2024-02-15 MED ORDER — PROSOURCE TF20 ENFIT COMPATIBL EN LIQD
60.0000 mL | Freq: Every day | ENTERAL | Status: DC
  Administered 2024-02-16 – 2024-02-22 (×7): 60 mL
  Filled 2024-02-15 (×7): qty 60

## 2024-02-15 MED ORDER — POTASSIUM CHLORIDE 20 MEQ PO PACK
40.0000 meq | PACK | ORAL | Status: AC
  Administered 2024-02-15 (×2): 40 meq
  Filled 2024-02-15 (×2): qty 2

## 2024-02-15 NOTE — Progress Notes (Signed)
 Orthopedic Tech Progress Note Patient Details:  Tracy Baker 1955-05-18 [id number]  Ortho Devices Type of Ortho Device: Radio broadcast assistant Ortho Device/Splint Location: BLE Ortho Device/Splint Interventions: Ordered, Application   Post Interventions Patient Tolerated: Well Instructions Provided: Care of device  [name] A Wilmer Berryhill 02/15/2024, 9:28 AM

## 2024-02-15 NOTE — Progress Notes (Signed)
 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 Kerrin. PCCM consulted for ICU post-op vent management.  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 Kerrin 12/6: Overnight patient started requiring more vasopressor support, early morning she was on dobutamine  5, milrinone  0.375, epinephrine  at 10, vasopressin  0.04 and norepinephrine  34 mics, she is not making urine.  Cannulated with Protek duo for RV support,  12/7 bronch, broadened antibiotics 12/8 decannulated from VV ECMO after sweep and flow wean trials. Lasix  stopped. Restarted TF. 12/10 chest tubes out, off pressors. Aline removed.  Interim History / Subjective:  Last night had agitation requiring precedex  to be started. She is not answering y/n questions this morning.   Objective    Blood pressure 99/68, pulse 93, temperature 98.1 F (36.7 C), resp. rate 16, height 5' 4 (1.626 m), weight 79.7 kg, SpO2 95%. CVP:  [10 mmHg-50 mmHg] 12 mmHg  Vent Mode: PSV;CPAP FiO2 (%):  [40 %] 40 % Set Rate:  [22 bmp] 22 bmp Vt Set:  [440 mL] 440 mL PEEP:  [5 cmH20-8 cmH20] 5 cmH20 Pressure Support:  [8 cmH20] 8 cmH20 Plateau Pressure:  [9 cmH20-11 cmH20] 10 cmH20   Intake/Output Summary (Last 24 hours) at 02/15/2024  9082 Last data filed at 02/15/2024 0800 Gross per 24 hour  Intake 1571.36 ml  Output 2540 ml  Net -968.64 ml   Filed Weights   02/13/24 0500 02/14/24 0458 02/15/24 0500  Weight: 79.9 kg 83.1 kg 79.7 kg     Physical exam: General: critically ill appearing woman lying in bed in NAD, intubated, not sedated HEENT: [place]/AT, eyes anicteric, ETT in place Neck: LIJ CVC, R internal jugular skin breakdown improving some Neuro:  RASS -1, weakly following commands, very weak cough Chest: still has moderate tan secretions, improving R basilar rhales Heart: S1S2, irreg rhythm, no longer pacing Abdomen: soft, NT Extremities: L hand warmer, still slightly cyanotic but has good cap refill throughout. +edema   I/O -1.1L, net +2.8L for admission  Coox 62% K+ 3.3 BUN 97 Cr 2.25 WBC 9.3 H/H 12.8/39.6 Platelets 59  CXR  ETT at the clavicles. RLL infiltrate improving Resp culture: enterobacter, pan-sensitive   Patient Lines/Drains/Airways Status     Active Line/Drains/Airways     Name Placement date Placement time Site Days   PICC Double Lumen 01/15/24 Right Basilic 35 cm 0 cm 01/15/24  8384  -- 31   NG/OG Vented/Dual Lumen 16 Fr. Oral 02/08/24  1333  Oral  7   Urethral Catheter [name], RN Latex;Straight-tip;Temperature probe 14 Fr. 02/08/24  0800  Latex;Straight-tip;Temperature probe  7   Airway 8 mm 02/08/24  0800  -- 7   Wound 01/16/24 0800 Traumatic Back Left;Upper 01/16/24  0800  Back  30   Wound 02/08/24 Surgical Closed Surgical Incision Sternum Mid 02/08/24  --  Sternum  7   Wound 02/12/24 1800 Pressure Injury Buttocks Right;Medial Deep Tissue Pressure Injury - Purple or maroon localized area of discolored intact skin or blood-filled blister due to damage of underlying soft tissue from pressure and/or shear. 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, with persistent severe mitral regurgitation and status post redo sternotomy and bioprosthetic MVR Previous candidemia Acute RV failure with cardiogenic shock status post Protek duo VV ECMO cannula on 12/6, decannulated 12/8 -con't DBA per AHF -con't lasix  -daily aspirin  -con't diflucan  -eventually needs coumadin once platelets are improving  Shock, mixed cardiogenic and distributive; continues to improve -con't DBA and antibiotics- vanc, cefepime   Acute respiratory failure with hypoxia RUL pneumonia- Enterobacter -LTVV -VAP prevention protocol -PAD protocol for sedation; goal RASS 0 to -1. Family understands how awake she will have to be to get to the point of extubation. Precedex  and PRN fentanyl  as needed. Daily SAT & SBT; very weak cough mechanics is a significant barrier for her -repeat trach aspirate culture; may need more than 7 days of antibiotics since we are day 6 with significant ongoing secretions   AKI on CKD stage IIIa, Cr plateau, but BUN still rising -strict I/O -renally dose meds, avoid nephrotoxic meds -con't diuresis -in next 1-2 days may have to make a decision about RRT -keep foley  Hypoglycemia improved; now mild hyperglycemia on tF H/o prediabetes, A1c 6 -SSI PRN -goal BG 140-180  Ileus; no issues with constipation -con't reglan  until at goal TF -holding bowel regimen with multiple bowel movements yesterday  Acute blood loss anemia status post multiple PRBC transfusion Thrombocytopenia due to critical illness status post 2 unit platelets Coagulopathy, probably from liver congestion requiring FFP transfusion -transfuse for Hb <7 or hemodynamically significant bleeding -con't watching platelets; hopefully [place] heparin  tomorrow -no need for transfusion  Severe protein calorie malnutrition -increase TF to 40cc/h after  cortrak -cortrak today -place NGT to suction if frequent gagging given previous issues with intractable n/v   Hyperbilirubinemia; unclear etiology, stable  -periodically monitor  Deconditioning, prolonged course of illness leading up to this  -PT, OT -ancipitate the need for CIR after this admission  Pressure ulcer on buttocks; skin breakdown from bandage on R neck -increasing TF -hopefully can start mobilizing more soon -off pressors, on low dose inotropes -frequent turns -chlorhexidine  added to allergy list from R neck, but may have also been adhesive that caused skin breakdown  Hypokalemia, improved -K+ repletion  Left hand cool; has thrombus in L radial -needs AC once platelets improve   Labs:  CBC: Recent Labs  Lab 02/12/24 0410 02/12/24 0817 02/12/24 1346 02/13/24 0339 02/13/24 0741 02/13/24 1007 02/13/24 1119 02/13/24 1618 02/14/24 0339 02/15/24 0524  WBC 9.0  --  9.1 8.5  --   --   --   --  10.3 9.3  HGB 14.1   < > 14.2 14.2   < > 14.3 13.6 15.0 13.5 12.8  HCT 42.0   < > 42.6 43.2   < > 42.0 40.0 44.0 42.4 39.6  MCV 91.5  --  91.6 92.1  --   --   --   --  95.9 95.0  PLT 46*  --  41* 45*  --   --   --   --  40* 59*   < > = values in this interval not displayed.    Basic Metabolic Panel: Recent Labs  Lab 02/11/24 1528 02/11/24 1548 02/12/24 0410 02/12/24 0817 02/13/24 0339 02/13/24 0741 02/13/24 1536 02/13/24 1618 02/14/24 0339 02/14/24 1741 02/15/24 0524  NA 141   < > 141   < > 140   < > 143 142 143 141 142  K 4.2   < > 3.9   < > 3.2*   < > 4.4 4.3 3.9 4.1 3.3*  CL 106  --  107   < > 107  --  111  --  106 107 106  CO2 21*  --  17*   < > 21*  --  18*  --  22 24 24   GLUCOSE 103*  --  118*   < > 127*  --  188*  --  149* 165* 174*  BUN 36*  --  45*   < > 67*  --  74*  --  88* 91* 97*  CREATININE 2.70*  --  2.70*   < > 2.82*  --  2.61*  --  2.33* 2.37* 2.25*  CALCIUM  7.8*  --  7.9*   < > 7.9*  --  8.3*  --  8.5* 8.8* 8.5*  MG 2.3  --  2.4  --  2.3   --   --   --  2.2  --  2.1  PHOS 6.3*  --  6.9*  --  5.9*  --   --   --  5.1*  --  4.8*   < > = values in this interval not displayed.   GFR: Estimated Creatinine Clearance: 24.4 mL/min (A) (by C-G formula based on SCr of 2.25 mg/dL (H)). Recent Labs  Lab 02/10/24 1853 02/11/24 0509 02/11/24 0515 02/11/24 9161 02/11/24 1528 02/12/24 0822 02/12/24 1346 02/13/24 0339 02/14/24 0339 02/15/24 0524  WBC  --    < >  --   --    < >  --  9.1 8.5 10.3 9.3  LATICACIDVEN 1.1  --  1.3 1.2  --  1.2  --   --   --   --    < > = values in this interval not displayed.    Liver Function Tests: Recent Labs  Lab 02/10/24 0511 02/10/24 1710 02/11/24 0509 02/12/24 0410 02/13/24 0339 02/14/24 0339 02/15/24 0524  AST 76* 56* 52* 26 18  --   --   ALT 6 5 <5 <5 <5  --   --   ALKPHOS 67 83 86 99 105  --   --   BILITOT 1.2 1.1 1.4* 1.4* 1.4*  --   --   PROT 4.4* 4.7* 4.6* 4.8* 4.8*  --   --   ALBUMIN  3.1*  3.2* 3.0* 2.7* 2.6* 2.4* 2.4* 2.3*   ABG    Component Value Date/Time   PHART 7.340 (L) 02/13/2024 1618   PCO2ART 33.4 02/13/2024 1618   PO2ART 89 02/13/2024 1618   HCO3 18.0 (L) 02/13/2024 1618   TCO2 19 (L) 02/13/2024 1618   ACIDBASEDEF 7.0 (H) 02/13/2024 1618   O2SAT 61.5 02/15/2024 0524    Coagulation Profile: Recent Labs  Lab 02/10/24 0700 02/10/24 1239 02/11/24 0509 02/12/24 0410 02/13/24 0339  INR 5.3* 3.1* 1.6* 1.3* 1.2      CBG: Recent Labs  Lab  02/14/24 1957 02/14/24 2123 02/15/24 0044 02/15/24 0340 02/15/24 0820  GLUCAP 134* 133* 148* 147* 169*   This patient is critically ill with multiple organ system failure which requires frequent high complexity decision making, assessment, support, evaluation, and titration of therapies. This was completed through the application of advanced monitoring technologies and extensive interpretation of multiple databases. During this encounter critical care time was devoted to patient care services described in this note  for 40 minutes.  Leita SHAUNNA Gaskins, DO 02/15/2024 4:33 PM [facility]  For contact information, see Amion. If no response to pager, please call PCCM consult pager. After hours, 7PM- 7AM, please call Elink.

## 2024-02-15 NOTE — Progress Notes (Signed)
 6 Days Post-Op Procedures (LRB): ECMO CANNULATION (N/A) Subjective: Intubated, awake and following commands but very weak  Objective: Vital signs in last 24 hours: Temp:  [97.2 F (36.2 C)-100.6 F (38.1 C)] 98.1 F (36.7 C) (12/12 0845) Pulse Rate:  [83-94] 93 (12/12 0845) Cardiac Rhythm: A-V Sequential paced (12/12 0400) Resp:  [12-33] 16 (12/12 0845) BP: (74-149)/(40-127) 99/68 (12/12 0830) SpO2:  [89 %-100 %] 95 % (12/12 0845) FiO2 (%):  [40 %] 40 % (12/12 0727) Weight:  [79.7 kg] 79.7 kg (12/12 0500)  Hemodynamic parameters for last 24 hours: CVP:  [10 mmHg-50 mmHg] 12 mmHg  Intake/Output from previous day: 12/11 0701 - 12/12 0700 In: 1503.8 [I.V.:534.3; NG/GT:607.5; IV Piggyback:362] Out: 2620 [Urine:2620] Intake/Output this shift: Total I/O In: 144 [I.V.:35.5; NG/GT:108.5] Out: 150 [Urine:150]  General appearance: alert, cooperative, and no distress Neurologic: generally weak without focal deficit Heart: irregularly irregular rhythm Lungs: coarse BS Abdomen: normal findings: soft, non-tender Extremities: Left hand warmer, cap refill 3sec vs 2 on R  Lab Results: Recent Labs    02/14/24 0339 02/15/24 0524  WBC 10.3 9.3  HGB 13.5 12.8  HCT 42.4 39.6  PLT 40* 59*   BMET:  Recent Labs    02/14/24 1741 02/15/24 0524  NA 141 142  K 4.1 3.3*  CL 107 106  CO2 24 24  GLUCOSE 165* 174*  BUN 91* 97*  CREATININE 2.37* 2.25*  CALCIUM  8.8* 8.5*    PT/INR:  Recent Labs    02/13/24 0339  LABPROT 15.4*  INR 1.2   ABG    Component Value Date/Time   PHART 7.340 (L) 02/13/2024 1618   HCO3 18.0 (L) 02/13/2024 1618   TCO2 19 (L) 02/13/2024 1618   ACIDBASEDEF 7.0 (H) 02/13/2024 1618   O2SAT 61.5 02/15/2024 0524   CBG (last 3)  Recent Labs    02/15/24 0044 02/15/24 0340 02/15/24 0820  GLUCAP 148* 147* 169*    Assessment/Plan: S/P Procedures (LRB): ECMO CANNULATION (N/A) Continues to slowly progress NEURO- no focal deficit, agitated last night  but calm this AM  Still generally weak  Elevated BUN may be playing a role, check ammonia CV- in A fib with controlled VR  Change pacer to VVI at 50  Co-ox 61, CVP 15  Will need to start coumadin or DOAC in next couple of days RESP_ VDRF, enterobacter pneumonia  On 40% and 5 PEEP but so weak concerned about ability to clear secretions  Vent per CCM ID- vanco and cefepime   Afebrile, WBC normal, CXR improving RENAL- creatinine better, and diuresing well BUN still rising Hypokalemia- supplement ENDO- CBG mildly elevated GI- tolerating TF Thrombocytopenia- PLT up slightly to 59K    LOS: 44 days    Tracy Baker 02/15/2024

## 2024-02-15 NOTE — Progress Notes (Signed)
 Nutrition Follow-up  DOCUMENTATION CODES:   Severe malnutrition in context of acute illness/injury  INTERVENTION:   Tube Feeding via Cortrak:  Vital 1.5 with goal of 55 ml/hr  Recommend slowly advancing TF to goal rate, titration order per MD Change Pro-Source TF20 60 mL to daily  TF at goal provides 109 g of protein, 2060 kcals,1003 mL of free water   Banatrol TF 60 mL BID via tube as stool bulking agent- each packet provides 5g soluble fiber  If started on RRT, recommend addition of Renal MVI daily  NUTRITION DIAGNOSIS:   Severe Malnutrition related to acute illness as evidenced by moderate muscle depletion, mild muscle depletion, energy intake < or equal to 50% for > or equal to 5 days.  Being addressed via TF  GOAL:   Patient will meet greater than or equal to 90% of their needs  Progressing  MONITOR:   Vent status, TF tolerance, Labs, Weight trends  REASON FOR ASSESSMENT:   Consult Other (Comment) (new ECMO)  ASSESSMENT:   68 yo female admitted with hx of Calbicans candidemia, being treated at [facility] in [place] but left AMA and presents with MV endocarditis requiring MVR, post-op course complicated by shock post-op, junctional bradycardia, afib with RVR and pneumonia. PMH includes nephrolithiasis s/p ureteral stent and lithotripsy  12/01  Epistaxis requiring bilateral nasal packing by ENT; N/V noted; refusing antiemetic 12/02 RHC: sig reduced cardiac index likely d/t MR; normal filling pressures noted 12/05 NPO, OR: Redo MVR 12/06 Shock post op (R predominant)-significant pressor requirements (Levo at 34, Vaso at 0.04, Epi at 10, DBA at 5, milrinone  at 0.375); Cath Lab for Protek Duo Cannulation 12/08 Protek Duo De-Cannulation, Trickle TF initiated 12/10 Lasix  gtt initiated 12/12 Corrak placed, TF increased to 40 ml/hr  Pt remains on vent support, pt very weak Currently on Levophed  at 3, DBA at 5  OG in place but exchanged for Cortrak today Tolerating Vital  1.5 at 30 ml/hr, increased to 40 ml/hr today  +significant loose stools yesterday, bowel regimen held, no BM yet today. +constipation previously. Added Banatrol TF20 for stool consistency  Significant pitting edema on exam Admit Wt:69.9 kg Current wt: 79.7 kg (down from 83.1 kg yesterday) Lowest Wt: 59 kg, suspect current dry weight around 59-60 kg unless documented weights are inaccurate  UOP 2.6 L in 24 hours, Net +13-14 L per I/O flow sheet. Lasix  gtt but edema persists.  Noted concern for possible need for RRT. BUN 97, Creatinine 2.25 Follow sodium trend  Labs Sodium 142 (wdl) Potassium 3.3 (L) Phosphorus 4.8 (H) Magnesium  2.1 (wdl) BUN 97 Creatinine 2.25 CBGs 115-169   Meds: Lasix  gtt SS novolog  Reglan    NUTRITION - FOCUSED PHYSICAL EXAM:  Flowsheet Row Most Recent Value  Orbital Region Moderate depletion  Upper Arm Region Unable to assess  [pitting edema]  Thoracic and Lumbar Region Unable to assess  [edema]  Buccal Region Unable to assess  Temple Region Severe depletion  Clavicle Bone Region Severe depletion  Clavicle and Acromion Bone Region Severe depletion  Scapular Bone Region Severe depletion  Dorsal Hand Unable to assess  Patellar Region Unable to assess  Anterior Thigh Region Unable to assess  Posterior Calf Region Unable to assess  Edema (RD Assessment) Moderate  Hair Reviewed  Eyes Reviewed  Mouth --  [extreme dry mouth, unable to wear bridge due to this]  Skin Reviewed  Nails Reviewed    Diet Order:   Diet Order  Diet NPO time specified  Diet effective now                   EDUCATION NEEDS:   Education needs have been addressed  Skin:  Skin Assessment: Skin Integrity Issues: Skin Integrity Issues:: DTI DTI: buttocks Incisions: chest (closed)  Last BM:  12/11 multiple large type 7 BMs  Height:   Ht Readings from Last 1 Encounters:  02/15/24 5' 4 (1.626 m)    Weight:   Wt Readings from Last 1 Encounters:   02/15/24 79.7 kg    Ideal Body Weight:  54.5 kg  BMI:  Body mass index is 30.16 kg/m.  Estimated Nutritional Needs:   Kcal:  1900-2100 kcals  Protein:  100-115g  Fluid:  1.5 L   [name] MS, RDN, LDN, CNSC Registered Dietitian 3 Clinical Nutrition RD Inpatient Contact Info in Amion

## 2024-02-15 NOTE — Progress Notes (Signed)
 OT Cancellation Note  Patient Details Name: Tracy Baker MRN: [id number] DOB: 12/13/1955   Cancelled Treatment:    Reason Eval/Treat Not Completed: Other (comment). Per RN, pt potentially extubated today, requesting for therapy to follow up in PM. Will follow up as able to.   Lynn Sissel C, OT  Acute Rehabilitation Services Office [phone] Secure chat preferred   Adrianne GORMAN Savers 02/15/2024, 8:29 AM

## 2024-02-15 NOTE — Progress Notes (Signed)
 CVP 14. Will repeat metolazone  and diamox  this afternoon.   Beckey LITTIE Coe AGACNP-BC  Advanced Heart Failure Team

## 2024-02-15 NOTE — Progress Notes (Signed)
 Daughter updated, questions answered. Requesting call from Dr. Gretta, RN notified.

## 2024-02-15 NOTE — Progress Notes (Signed)
° °  [address], [place] [address]             [phone]    POD 7 Redo MVR  Intubated, sedated with low dose precedex    BP (!) 108/55   Pulse 85   Temp 99.1 F (37.3 C)   Resp 17   Ht 5' 4 (1.626 m)   Wt 79.7 kg   SpO2 95%   BMI 30.16 kg/m  Dobutamine  5, norepi 3  Intake/Output Summary (Last 24 hours) at 02/15/2024 1845 Last data filed at 02/15/2024 1800 Gross per 24 hour  Intake 2168.35 ml  Output 2230 ml  Net -61.65 ml   PRVC 22/ 40%, 8PEEP  Continue current care  Requan Hardge C. Kerrin, MD [facility] [phone]

## 2024-02-15 NOTE — Progress Notes (Addendum)
 Advanced Heart Failure Rounding Note  Cardiologist: Lonni Cash, MD  Chief Complaint: S/p MV Repair / MV endocarditis  Patient Profile  Tracy Baker is a 68 y.o. female with hx of recent nephrolithiasis s/p ureteral stent and lithotripsy at [facility] 10/10 with stent removal. 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 Post-op course c/b post cardiotomy shock, junctional bradycardia, afib w/ RVR and HAP.  Subjective:    RHC 12/2 (on 0.125 of Milrinone ): RA 4, PA 42/13 (22), PCW 12 with Vwaves to 20, CO/CI TD 3.37/2.03, PAPi 7.5  12/5 S/P Redo MVR - Intra Op given  4UPRBCs, 2UPLTs, 2FFP, 1Cyro  12/6 Protek Duo cannulation 12/8 Decannulated 12/9 Diuresed with IV lasix  120 mg x1.  12/10 lasix  gtt started.   CVP 10-12, on lasix  gtt @30 , metolazone  5 mg BID yesterday. -2.6L UOP.   Currently on Norepi 3, DBA 5.   CO-OX 62%   Follows commands this morning.   Objective:    Weight Range: 79.7 kg Body mass index is 30.16 kg/m.   Vital Signs:   Temp:  [97.3 F (36.3 C)-100.6 F (38.1 C)] 97.3 F (36.3 C) (12/12 0600) Pulse Rate:  [83-94] 89 (12/12 0600) Resp:  [12-33] 19 (12/12 0600) BP: (64-149)/(40-127) 93/71 (12/12 0600) SpO2:  [89 %-100 %] 98 % (12/12 0600) FiO2 (%):  [40 %] 40 % (12/12 0727) Weight:  [79.7 kg] 79.7 kg (12/12 0500) Last BM Date : 02/15/24  Weight change: Filed Weights   02/13/24 0500 02/14/24 0458 02/15/24 0500  Weight: 79.9 kg 83.1 kg 79.7 kg   Intake/Output:  Intake/Output Summary (Last 24 hours) at 02/15/2024 0745 Last data filed at 02/15/2024 9376 Gross per 24 hour  Intake 1449.98 ml  Output 2620 ml  Net -1170.02 ml   Physical Exam  General:  chronically ill appearing.  Intubated Neck: JVD UTA.  Cor: Paced rate & rhythm.  Lungs: coarse throughout Extremities: +1-2 BLE edema to thigh. + UNNA boots. PICC RUE. L hand warner today.   GU: + foley Neuro: intubated. Follows commands  Telemetry    Paced 90s, intt NSVT (Personally reviewed)    Labs    CBC Recent Labs    02/14/24 0339 02/15/24 0524  WBC 10.3 9.3  HGB 13.5 12.8  HCT 42.4 39.6  MCV 95.9 95.0  PLT 40* 59*   Basic Metabolic Panel Recent Labs    87/88/74 0339 02/14/24 1741 02/15/24 0524  NA 143 141 142  K 3.9 4.1 3.3*  CL 106 107 106  CO2 22 24 24   GLUCOSE 149* 165* 174*  BUN 88* 91* 97*  CREATININE 2.33* 2.37* 2.25*  CALCIUM  8.5* 8.8* 8.5*  MG 2.2  --  2.1  PHOS 5.1*  --  4.8*   Liver Function Tests Recent Labs    02/13/24 0339 02/14/24 0339 02/15/24 0524  AST 18  --   --   ALT <5  --   --   ALKPHOS 105  --   --   BILITOT 1.4*  --   --   PROT 4.8*  --   --   ALBUMIN  2.4* 2.4* 2.3*    Medications:    Scheduled Medications:  feeding supplement (PROSource TF20)  60 mL Per Tube QID   fentaNYL  (SUBLIMAZE ) injection  25 mcg Intravenous Once   Gerhardt's butt cream   Topical BID   insulin  aspart  0-9 Units Subcutaneous Q4H   metoCLOPramide  (REGLAN ) injection  5 mg Intravenous Q8H  mouth rinse  15 mL Mouth Rinse Q2H   pantoprazole  (PROTONIX ) IV  40 mg Intravenous QHS   potassium chloride   20 mEq Per Tube Q4H   scopolamine   1 patch Transdermal Q72H   senna-docusate  2 tablet Per Tube QHS   sodium chloride  flush  10-40 mL Intracatheter Q12H   sodium chloride  flush  10-40 mL Intracatheter Q12H   sodium chloride  HYPERTONIC  4 mL Nebulization Q4H   vancomycin  variable dose per unstable renal function (pharmacist dosing)   Does not apply See admin instructions    Infusions:  ceFEPime  (MAXIPIME ) IV Stopped (02/14/24 1819)   dexmedetomidine  Stopped (02/15/24 0513)   DOBUTamine  5 mcg/kg/min (02/15/24 9376)   feeding supplement (VITAL 1.5 CAL) 30 mL/hr at 02/15/24 9376   fentaNYL  infusion INTRAVENOUS Stopped (02/14/24 2128)   fluconazole  (DIFLUCAN ) IV Stopped (02/14/24 1255)   furosemide  (LASIX ) 200 mg in dextrose  5 % 100 mL (2 mg/mL) infusion 30 mg/hr (02/15/24 9376)   norepinephrine   (LEVOPHED ) Adult infusion 3 mcg/min (02/15/24 9376)   promethazine  (PHENERGAN ) injection (IM or IVPB)      PRN Medications: albuterol , fentaNYL , mouth rinse, polyethylene glycol, promethazine  (PHENERGAN ) injection (IM or IVPB), sodium chloride  flush, sodium chloride  flush  Assessment/Plan   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 Kerrin for Candida endocarditis. Pre-op L/RHC 01/04/24 with no CAD and normal L/R heart pressures.  - Failed initial Milrinone  wean. Eventually weaned off, unfortunately restarted 12/1 for low output.  - TEE 11/18  with severe mitral regurgitation of repaired valve - S/P Redo MVR complicated by rapidly worsening RV failure - Protek Cannulation 12/6, Decannulated 12/8  - Respiratory culture- Enterobacter, On cefepime , vancomycin , and fluconazole .  - Stable off iNO  - CVP 10-12. Continue lasix  gtt @ 30. Repeat metolazone  5 mg this morning and add 250 diamox  x1.  - Continue support with IV Norepi 3 + DBA 5 mcg. CO-OX 62%  Acute hypoxic respiratory failure: Suspect component of infection/aspiration PNA given unilateral disease.  - Respiratory culture- Enterobacter, On cefepime , vancomycin , and fluconazole .   Coagulopathy/thrombocytopenia/ABLA anemia:  - Thrombocytopenia present,  40 - INR now back to normal, PTT not significantly elevated, no DIC - Hgb goal >8  Sick sinus - Off amiodarone  - Now with asystole underneath (as of 12/10) - Continue DDD at 90 - suspect due to underlying swelling  Afib - Post operative - Hold off on heparin , PLTs 59.  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   Protein-calorie malnutrition - On TF  Epistaxis  - s/p bilateral nasal packing per ENT 12/1    - Off  AC  Pressure Ulcer R Buttock, DTI - Q2 turns, offload  CRITICAL CARE Performed by: Beckey LITTIE Coe   Total critical care time: 16 minutes  Critical care time was exclusive of separately billable procedures and treating other patients.  Critical care was necessary to treat or prevent imminent or life-threatening deterioration.  Critical care was time spent personally by me on the following activities: development of treatment plan with patient and/or surrogate as well as nursing, discussions with consultants, evaluation of patient's response to treatment, examination of patient, obtaining history from patient or surrogate, ordering and performing treatments and interventions, ordering and review of laboratory studies, ordering and  review of radiographic studies, pulse oximetry and re-evaluation of patient's condition.   Length of stay: 44  Beckey LITTIE Coe, NP  02/15/2024, 7:45 AM  Patient seen with NP, I formulated the plan and agree with the above note.   Some diuresis yesterday, I/Os net negative 1116 but CVP still 16.  Creatinine 2.25 => 2.37 but BUN rising to 97 today.  She remains on dobutamine  5 and NE 2 primarily for RV support.  Co-ox 62%.  Tm 100 yesterday pm.   Rhythm junctional vs atrial fibrillation with grouped beating.    General: NAD, intubated Neck: JVP 14-16, no thyromegaly or thyroid  nodule.  Lungs: Clear to auscultation bilaterally with normal respiratory effort. CV: Nondisplaced PMI.  Heart irregular S1/S2, no S3/S4, no murmur.  1+ ankle edema.   Abdomen: Soft, nontender, no hepatosplenomegaly, no distention.  Skin: Intact without lesions or rashes.  Neurologic: She can follow commands.  Extremities: No clubbing or cyanosis.  HEENT: Normal.   She is now off pacing with underlying junctional rhythm vs AF with grouped beating.  Will get ECG today to evaluate.     Echo from 12/8 showed EF 50%, RV mildly dilated and mildly dysfunctional, s/p bioprosthetic mitral valve  (stable).  I suspect that we are currently dealing with primarily RV failure.  Co-ox 62%, continue current dobutamine  5 and NE 2 for RV support while diuresing.  Still volume overloaded with CVP 16.  Continue Lasix  30 mg/hr and will give metolazone  5 mg x 1 + acetazolamide  250 x 1 today.  Creatinine lower, but with rising BUN, I am concerned that she may end up needing RRT.  Will reassess tomorrow.    Continuing cefepime /vancomycin /fluconazole , vancomycin  ends today.     Platelets trending up, 59K today.  May be due to acute illness/inflammation. Not on anticoagulation currently.   CRITICAL CARE Performed by: Ezra Shuck  Total critical care time: 45 minutes  Critical care time was exclusive of separately billable procedures and treating other patients.  Critical care was necessary to treat or prevent imminent or life-threatening deterioration.  Critical care was time spent personally by me on the following activities: development of treatment plan with patient and/or surrogate as well as nursing, discussions with consultants, evaluation of patient's response to treatment, examination of patient, obtaining history from patient or surrogate, ordering and performing treatments and interventions, ordering and review of laboratory studies, ordering and review of radiographic studies, pulse oximetry and re-evaluation of patient's condition.  Ezra Shuck 02/15/2024 9:43 AM

## 2024-02-15 NOTE — Progress Notes (Signed)
 Son updated at bedside. All questions answered.  Leita SHAUNNA Gaskins, DO 02/15/2024 6:40 PM [facility]  For contact information, see Amion. If no response to pager, please call PCCM consult pager. After hours, 7PM- 7AM, please call Elink.

## 2024-02-15 NOTE — Procedures (Signed)
 Cortrak  Tube Type:  Cortrak - 43 inches Tube Location:  Left nare Secured by: Bridle Initial Placement:  Gastric Technique Used to Measure Tube Placement:  Marking at nare/corner of mouth Cortrak Secured At:  67 cm   Cortrak Tube Team Note:  Consult received to place a Cortrak feeding tube.   No x-ray is required. RN may begin using tube.   If the tube becomes dislodged please keep the tube and contact the Cortrak team at [email or web address] for replacement.  If after hours and replacement cannot be delayed, place a NG tube and confirm placement with an abdominal x-ray.    Augustin Shams MS, RD, LDN If unable to be reached, please send secure chat to RD inpatient available from 8:00a-4:00p daily

## 2024-02-15 NOTE — Progress Notes (Signed)
 Pharmacy Antibiotic Note  Tracy Baker is a 68 y.o. female admitted on 12/28/2023 with PA-ECMO> decannulated.  Pharmacy has been consulted for merrem  and Vancomycin  dosing.  Cannulated with Protek Duo 12/5> decanulated 12/8 Variable renal function have been checking levels and redosing vancomycin   12/5 Vancomycin  1250 mg x 1 12/6 VR @1605  = 21 mcg/dL 87/2 VR @0955  = 19 mcg/dL 87/0 VR ~ 9589 = 16 mcg/dL > vancomycin  1gm at 10am 12/11 VR~ 0500 = 21 mcg/dl - will recheck level in am and redose then  12/12 VR ~0500 = 19 > vancomycin  750mg  x1 - last dose - EOT  Plan: Continue Fluconazole  IV 400 mg daily Continue Merrem  1g IV q12 Vancomycin  750mg  IV x1 12/12 EOT  Height: 5' 4 (162.6 cm) Weight: 79.7 kg (175 lb 11.3 oz) IBW/kg (Calculated) : 54.7  Temp (24hrs), Avg:98.9 F (37.2 C), Min:97.2 F (36.2 C), Max:100.6 F (38.1 C)  Recent Labs  Lab 02/10/24 0846 02/10/24 0955 02/10/24 1853 02/11/24 0509 02/11/24 0515 02/11/24 0838 02/11/24 1528 02/12/24 0410 02/12/24 0822 02/12/24 1346 02/13/24 0339 02/13/24 1536 02/14/24 0339 02/14/24 1741 02/15/24 0524  WBC  --    < >  --    < >  --   --    < > 9.0  --  9.1 8.5  --  10.3  --  9.3  CREATININE  --    < >  --    < >  --   --    < > 2.70*  --  2.78* 2.82* 2.61* 2.33* 2.37* 2.25*  LATICACIDVEN 1.0  --  1.1  --  1.3 1.2  --   --  1.2  --   --   --   --   --   --   VANCORANDOM  --    < >  --   --   --   --   --  16  --   --   --   --  21  --  19   < > = values in this interval not displayed.    Estimated Creatinine Clearance: 24.4 mL/min (A) (by C-G formula based on SCr of 2.25 mg/dL (H)).    Allergies  Allergen Reactions   Codeine Anaphylaxis and Nausea And Vomiting   Bactoshield Chg [Chlorhexidine  Gluconate] Dermatitis   Red Dye #40 (Allura Red) Nausea Only    Antimicrobials this admission: Zosyn 10/19>10/20 CRO 10/20 >>10/22 Azithro 10/22 >> 10/24 Fluconazole  10/19 >> 10/25 Micafungin  10/25 >> 10/11 Fluconazole   11/11 >> Linezolid  11/13 >> 11/17 Cefepime  11/13 >> 11/17 Vanc 12/5>>12/12 Merrem  12/6>>   Microbiology results: 10/18 Bcx: 1/2 Candida albicans  10/20 BCx: budding yeasts  10/22, 10/24, 10/25 Bcx: all neg 10/18 UCx: budding yeasts  12/7 BAL - enterobacter cloacae 12/9 TA - enterobacter cloacae    Olam Chalk Pharm.D. CPP, BCPS Clinical Pharmacist [phone] 02/15/2024 9:38 AM   [facility] pharmacy phone numbers are listed on [email or web address]

## 2024-02-15 NOTE — Progress Notes (Signed)
 OT Cancellation Note  Patient Details Name: Tracy Baker MRN: [id number] DOB: 1955/08/18   Cancelled Treatment:    Reason Eval/Treat Not Completed: Medical issues which prohibited therapy. Per RN, pt is no longer going to be extubated today, and will begin anticoagulants occluded L distal radial artery.   Cid Agena C, OT  Acute Rehabilitation Services Office [phone] Secure chat preferred   Adrianne GORMAN Savers 02/15/2024, 12:08 PM

## 2024-02-15 NOTE — Progress Notes (Addendum)
 PT Cancellation Note  Patient Details Name: Tracy Baker MRN: [id number] DOB: 09/27/1955   Cancelled Treatment:    Reason Eval/Treat Not Completed: (P) Other (comment). RN reporting plan for potential extubation today. Imaging yesterday also showed L distal radial artery obstruction, will await orders/plan from MD. Will plan to follow-up later as time permits.  Addendum 12:09 - Not planning to extubate today now and starting anticoagulant for UE occlusion. Will plan to follow-up another day as able.   Theo Ferretti, PT, DPT Acute Rehabilitation Services  Office: [phone]    Theo CHRISTELLA Ferretti 02/15/2024, 8:33 AM

## 2024-02-16 ENCOUNTER — Inpatient Hospital Stay (HOSPITAL_COMMUNITY)

## 2024-02-16 DIAGNOSIS — N179 Acute kidney failure, unspecified: Secondary | ICD-10-CM

## 2024-02-16 DIAGNOSIS — N183 Chronic kidney disease, stage 3 unspecified: Secondary | ICD-10-CM | POA: Diagnosis not present

## 2024-02-16 DIAGNOSIS — B377 Candidal sepsis: Secondary | ICD-10-CM | POA: Diagnosis not present

## 2024-02-16 DIAGNOSIS — R570 Cardiogenic shock: Secondary | ICD-10-CM | POA: Diagnosis not present

## 2024-02-16 DIAGNOSIS — I340 Nonrheumatic mitral (valve) insufficiency: Secondary | ICD-10-CM | POA: Diagnosis not present

## 2024-02-16 DIAGNOSIS — J9601 Acute respiratory failure with hypoxia: Secondary | ICD-10-CM | POA: Diagnosis not present

## 2024-02-16 LAB — GLUCOSE, CAPILLARY
Glucose-Capillary: 133 mg/dL — ABNORMAL HIGH (ref 70–99)
Glucose-Capillary: 152 mg/dL — ABNORMAL HIGH (ref 70–99)
Glucose-Capillary: 159 mg/dL — ABNORMAL HIGH (ref 70–99)
Glucose-Capillary: 169 mg/dL — ABNORMAL HIGH (ref 70–99)
Glucose-Capillary: 193 mg/dL — ABNORMAL HIGH (ref 70–99)
Glucose-Capillary: 194 mg/dL — ABNORMAL HIGH (ref 70–99)

## 2024-02-16 LAB — RENAL FUNCTION PANEL
Albumin: 2.3 g/dL — ABNORMAL LOW (ref 3.5–5.0)
Anion gap: 12 (ref 5–15)
BUN: 102 mg/dL — ABNORMAL HIGH (ref 8–23)
CO2: 25 mmol/L (ref 22–32)
Calcium: 8.3 mg/dL — ABNORMAL LOW (ref 8.9–10.3)
Chloride: 107 mmol/L (ref 98–111)
Creatinine, Ser: 2.02 mg/dL — ABNORMAL HIGH (ref 0.44–1.00)
GFR, Estimated: 26 mL/min — ABNORMAL LOW (ref >60)
Glucose, Bld: 203 mg/dL — ABNORMAL HIGH (ref 70–99)
Phosphorus: 5.2 mg/dL — ABNORMAL HIGH (ref 2.5–4.6)
Potassium: 4 mmol/L (ref 3.5–5.1)
Sodium: 144 mmol/L (ref 135–145)

## 2024-02-16 LAB — COOXEMETRY PANEL
Carboxyhemoglobin: 1.7 % — ABNORMAL HIGH (ref 0.5–1.5)
Methemoglobin: <0.7 % (ref 0.0–1.5)
O2 Saturation: 66 %
Total hemoglobin: 13.9 g/dL (ref 12.0–16.0)

## 2024-02-16 LAB — CBC
HCT: 39.3 % (ref 36.0–46.0)
Hemoglobin: 12.2 g/dL (ref 12.0–15.0)
MCH: 30.6 pg (ref 26.0–34.0)
MCHC: 31 g/dL (ref 30.0–36.0)
MCV: 98.5 fL (ref 80.0–100.0)
Platelets: 61 K/uL — ABNORMAL LOW (ref 150–400)
RBC: 3.99 MIL/uL (ref 3.87–5.11)
RDW: 20.1 % — ABNORMAL HIGH (ref 11.5–15.5)
WBC: 8.6 K/uL (ref 4.0–10.5)
nRBC: 0.2 % (ref 0.0–0.2)

## 2024-02-16 LAB — URINALYSIS, ROUTINE W REFLEX MICROSCOPIC
Bilirubin Urine: NEGATIVE
Glucose, UA: NEGATIVE mg/dL
Ketones, ur: NEGATIVE mg/dL
Leukocytes,Ua: NEGATIVE
Nitrite: NEGATIVE
Protein, ur: NEGATIVE mg/dL
Specific Gravity, Urine: 1.008 (ref 1.005–1.030)
pH: 5 (ref 5.0–8.0)

## 2024-02-16 LAB — MAGNESIUM: Magnesium: 2.1 mg/dL (ref 1.7–2.4)

## 2024-02-16 MED ORDER — POTASSIUM CHLORIDE 20 MEQ PO PACK
40.0000 meq | PACK | Freq: Once | ORAL | Status: AC
  Administered 2024-02-16: 40 meq
  Filled 2024-02-16: qty 2

## 2024-02-16 MED ORDER — PRISMASOL BGK 4/2.5 32-4-2.5 MEQ/L EC SOLN: Status: DC

## 2024-02-16 MED ORDER — SODIUM CHLORIDE 0.9% FLUSH
10.0000 mL | Freq: Two times a day (BID) | INTRAVENOUS | Status: DC
  Administered 2024-02-16 – 2024-02-25 (×16): 10 mL
  Administered 2024-02-26: 30 mL
  Administered 2024-02-26 – 2024-02-27 (×2): 10 mL
  Administered 2024-02-27: 20 mL
  Administered 2024-02-28 (×2): 10 mL
  Administered 2024-02-29: 20 mL
  Administered 2024-02-29 – 2024-03-10 (×20): 10 mL

## 2024-02-16 MED ORDER — SODIUM CHLORIDE 0.9% FLUSH
10.0000 mL | Freq: Two times a day (BID) | INTRAVENOUS | Status: DC
  Administered 2024-02-16 (×2): 10 mL

## 2024-02-16 MED ORDER — SODIUM CHLORIDE 0.9% FLUSH: 10.0000 mL | INTRAVENOUS | Status: DC | PRN

## 2024-02-16 MED ORDER — VITAL 1.5 CAL PO LIQD
1000.0000 mL | ORAL | Status: DC
  Administered 2024-02-16 – 2024-02-20 (×5): 1000 mL

## 2024-02-16 MED ORDER — INSULIN GLARGINE 100 UNIT/ML ~~LOC~~ SOLN
15.0000 [IU] | Freq: Every day | SUBCUTANEOUS | Status: DC
  Administered 2024-02-16 – 2024-03-08 (×22): 15 [IU] via SUBCUTANEOUS
  Filled 2024-02-16 (×17): qty 0.15

## 2024-02-16 MED ORDER — SODIUM CHLORIDE 0.9 % IV SOLN
20.0000 ug | Freq: Once | INTRAVENOUS | Status: AC
  Administered 2024-02-16: 20 ug via INTRAVENOUS
  Filled 2024-02-16: qty 5

## 2024-02-16 MED ORDER — HEPARIN SODIUM (PORCINE) 1000 UNIT/ML DIALYSIS
1000.0000 [IU] | INTRAMUSCULAR | Status: DC | PRN
  Administered 2024-02-19: 08:00:00 2800 [IU] via INTRAVENOUS_CENTRAL
  Filled 2024-02-16: qty 6
  Filled 2024-02-16: qty 4

## 2024-02-16 MED ORDER — POLYETHYLENE GLYCOL 3350 17 G PO PACK
17.0000 g | PACK | Freq: Every day | ORAL | Status: DC
  Administered 2024-02-16: 17 g via ORAL
  Filled 2024-02-16: qty 1

## 2024-02-16 NOTE — Progress Notes (Signed)
° °  [address], [place] [address]             [phone]   Intubated, responsive, requiring some sedation  BP (!) 110/56   Pulse 76   Temp 99.9 F (37.7 C)   Resp (!) 22   Ht 5' 4 (1.626 m)   Wt 80.5 kg   SpO2 93%   BMI 30.46 kg/m    Intake/Output Summary (Last 24 hours) at 02/16/2024 1743 Last data filed at 02/16/2024 1700 Gross per 24 hour  Intake 2571.04 ml  Output 3500 ml  Net -928.96 ml   She has been diuresing and creatinine is improving but still uremic and edematous which is interfering with vent wean  For CRRT short term to hopefully speed recovery  Elspeth C. Kerrin, MD [facility] [phone]

## 2024-02-16 NOTE — Progress Notes (Signed)
 NAME:  Tracy Baker, MRN:  [id number], DOB:  09-May-1955, LOS: 45 ADMISSION DATE:  12/28/2023, CONSULTATION DATE:  02/08/24 REFERRING MD:  Kerrin RAMAN., CHIEF COMPLAINT:  s/p MVR   History of Present Illness:  Tracy Baker is a 68 year old female with past medical history significant for nephrolithiasis s/p ureteral stent and lithotripsy at [facility] 10/10, with stent removal, severe MR, MV repair (01/10/24) due to MV Candida endocarditis c/b continued severe MR, who presents today for a redo sternotomy and MVR w/ Dr. Kerrin. PCCM consulted for ICU post-op vent management.  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 Kerrin 12/6: Overnight patient started requiring more vasopressor support, early morning she was on dobutamine  5, milrinone  0.375, epinephrine  at 10, vasopressin  0.04 and norepinephrine  34 mics, she is not making urine.  Cannulated with Protek duo for RV support,  12/7 bronch, broadened antibiotics 12/8 decannulated from VV ECMO after sweep and flow wean trials. Lasix  stopped. Restarted TF. 12/10 chest tubes out, off pressors. Aline removed.  Interim History / Subjective:  Overnight no acute events. Still tolerating TF well.   Objective    Blood pressure 121/81, pulse 85, temperature 99.5 F (37.5 C), resp. rate 18, height 5' 4 (1.626 m), weight 80.5 kg, SpO2 97%. CVP:  [15 mmHg-27 mmHg] 27 mmHg  Vent Mode: PSV;CPAP FiO2 (%):  [40 %] 40 % Set Rate:  [22 bmp] 22 bmp Vt Set:  [440 mL] 440 mL PEEP:  [5 cmH20-8 cmH20] 5 cmH20 Pressure Support:  [5 cmH20] 5 cmH20 Plateau Pressure:  [233 cmH20] 233 cmH20   Intake/Output Summary (Last 24 hours) at 02/16/2024 0823 Last data filed at 02/16/2024 0800 Gross per 24 hour  Intake  2263.84 ml  Output 2950 ml  Net -686.16 ml   Filed Weights   02/14/24 0458 02/15/24 0500 02/16/24 0324  Weight: 83.1 kg 79.7 kg 80.5 kg     Physical exam: General: critically ill appearing woman lying in bed intubated HEENT: [place]/AT, ETT Neck: LIJ CVC, R neck improving Neuro:  RASS -1, follows some commands, still confused Chest: copious gray secretions, slightly stronger cough Heart: S1S2, irreg rhythm Abdomen: soft, NT Extremities: left hand warmer, +edema   I/O -580cc, net +2.3L for admission  Coox 66% Na+ 144 K+ 4 BUN 102 Cr 2.02 WBC 8.6 H/H 12.2/39.3 Platelets 61    Patient Lines/Drains/Airways Status     Active Line/Drains/Airways     Name Placement date Placement time Site Days   PICC Double Lumen 01/15/24 Right Basilic 35 cm 0 cm 01/15/24  8384  -- 32   Urethral Catheter [name], RN Latex;Straight-tip;Temperature probe 14 Fr. 02/08/24  0800  Latex;Straight-tip;Temperature probe  8   Airway 8 mm 02/08/24  0800  -- 8   Small Bore Feeding Tube Left nare Marking at nare/corner of mouth 67 cm 02/15/24  1135  Left nare  1   Wound 01/16/24 0800 Traumatic Back Left;Upper 01/16/24  0800  Back  31   Wound 02/08/24 Surgical Closed Surgical Incision Sternum Mid 02/08/24  --  Sternum  8   Wound 02/12/24 1800 Pressure Injury Buttocks Right;Medial Deep Tissue Pressure Injury - Purple or maroon localized area of discolored intact skin or blood-filled blister due to damage of underlying soft tissue from pressure and/or  shear. 02/12/24  1800  Buttocks  4   Wound 02/13/24 1900 Irritant Contact Dermatitis Neck Right 02/13/24  1900  Neck  3   Wound 02/14/24 0800 Other (Comment) Groin Bilateral 02/14/24  0800  Groin  2           Resolved Problem List:   Hypokalemia  Assessment and Plan:  MV endocarditis s/p MV repair 01/10/2024, with persistent severe mitral regurgitation and status post redo sternotomy and bioprosthetic MVR Previous candidemia Acute RV failure with  cardiogenic shock status post Protek duo VV ECMO cannula on 12/6, decannulated 12/8 -DBA per AHF  -start CRRT for volume removal; nephro consulted. Needs HD catheter. -aspirin  -con't diflucan  -eventually needs coumadin once platelets are improved  Shock, mixed cardiogenic and distributive; continues to improve -con't antibiotics -DBA  Acute respiratory failure with hypoxia RUL pneumonia- Enterobacter -LTVV -VAP prevention protocol -PAD protocol- minimizing sedation as much as possible. Goal RASS 0 to -1. -daily SAT & SBT; can tolerate tidal breathing but cough remains very weak with copious secretions -repeat trach aspirate culture -keep cefepime  due to copious secretions; need to reassess sputum gram stain  AKI on CKD stage IIIa, Cr plateau, but BUN still rising -strict I/O -renally dose meds, avoid nephrotoxic med -consult Nephro for CRRT to help accelerate volume removal and metabolic clearance -diuresis going to stop once starting CRRT -keep foley for UOP monitoring  Hypoglycemia improved; now mild hyperglycemia on tF H/o prediabetes, A1c 6 -SSI PRN -goal BG 140-180  Ileus; no issues with constipation -con't reglan  until TF at goal -restart scheduled miralax  with no BM yesterday  Acute blood loss anemia status post multiple PRBC transfusion Thrombocytopenia due to critical illness status post 2 unit platelets Coagulopathy, probably from liver congestion requiring FFP transfusion -transfuse for Hb <7 or hemodynamically significant bleeding -needs DDAVP  before HD catheter goes in -decide on [facility] after HD catheter to assess how likely she is to have bleeding complications  Severe protein calorie malnutrition -increase TF to 50cc/h today -con't cortrak  Hyperbilirubinemia; unclear etiology, stable  -periodically monitor  Deconditioning, prolonged course of illness leading up to this  -PT, OT  -will need CIR  Pressure ulcer on buttocks; skin breakdown from bandage  on R neck -con't optimizing nutrition -PT & OT consulted to help start mobilizing now that she can follow commands -off pressors -turns  -chlorhexidine  added to allergy list from R neck, but may have also been adhesive that caused skin breakdown  Left hand cool; has thrombus in L radial -hopefully can start [facility] soon -con't watching perfusion in left hand  Dr. Brice updated family via phone this morning.   Labs:  CBC: Recent Labs  Lab 02/12/24 1346 02/13/24 0339 02/13/24 0741 02/13/24 1119 02/13/24 1618 02/14/24 0339 02/15/24 0524 02/16/24 0515  WBC 9.1 8.5  --   --   --  10.3 9.3 8.6  HGB 14.2 14.2   < > 13.6 15.0 13.5 12.8 12.2  HCT 42.6 43.2   < > 40.0 44.0 42.4 39.6 39.3  MCV 91.6 92.1  --   --   --  95.9 95.0 98.5  PLT 41* 45*  --   --   --  40* 59* 61*   < > = values in this interval not displayed.    Basic Metabolic Panel: Recent Labs  Lab 02/12/24 0410 02/12/24 0817 02/13/24 0339 02/13/24 0741 02/14/24 0339 02/14/24 1741 02/15/24 0524 02/15/24 1600 02/16/24 0515  NA 141   < > 140   < >  143 141 142 143 144  K 3.9   < > 3.2*   < > 3.9 4.1 3.3* 5.2* 4.0  CL 107   < > 107   < > 106 107 106 109 107  CO2 17*   < > 21*   < > 22 24 24 23 25   GLUCOSE 118*   < > 127*   < > 149* 165* 174* 193* 203*  BUN 45*   < > 67*   < > 88* 91* 97* 101* 102*  CREATININE 2.70*   < > 2.82*   < > 2.33* 2.37* 2.25* 2.16* 2.02*  CALCIUM  7.9*   < > 7.9*   < > 8.5* 8.8* 8.5* 8.7* 8.3*  MG 2.4  --  2.3  --  2.2  --  2.1  --  2.1  PHOS 6.9*  --  5.9*  --  5.1*  --  4.8*  --  5.2*   < > = values in this interval not displayed.   GFR: Estimated Creatinine Clearance: 27.4 mL/min (A) (by C-G formula based on SCr of 2.02 mg/dL (H)). Recent Labs  Lab 02/10/24 1853 02/11/24 0509 02/11/24 0515 02/11/24 9161 02/11/24 1528 02/12/24 0822 02/12/24 1346 02/13/24 0339 02/14/24 0339 02/15/24 0524 02/16/24 0515  WBC  --    < >  --   --    < >  --    < > 8.5 10.3 9.3 8.6  LATICACIDVEN  1.1  --  1.3 1.2  --  1.2  --   --   --   --   --    < > = values in this interval not displayed.    Liver Function Tests: Recent Labs  Lab 02/10/24 0511 02/10/24 1710 02/11/24 0509 02/12/24 0410 02/13/24 0339 02/14/24 0339 02/15/24 0524 02/16/24 0515  AST 76* 56* 52* 26 18  --   --   --   ALT 6 5 <5 <5 <5  --   --   --   ALKPHOS 67 83 86 99 105  --   --   --   BILITOT 1.2 1.1 1.4* 1.4* 1.4*  --   --   --   PROT 4.4* 4.7* 4.6* 4.8* 4.8*  --   --   --   ALBUMIN  3.1*  3.2* 3.0* 2.7* 2.6* 2.4* 2.4* 2.3* 2.3*   ABG    Component Value Date/Time   PHART 7.340 (L) 02/13/2024 1618   PCO2ART 33.4 02/13/2024 1618   PO2ART 89 02/13/2024 1618   HCO3 18.0 (L) 02/13/2024 1618   TCO2 19 (L) 02/13/2024 1618   ACIDBASEDEF 7.0 (H) 02/13/2024 1618   O2SAT 66 02/16/2024 0513    Coagulation Profile: Recent Labs  Lab 02/10/24 0700 02/10/24 1239 02/11/24 0509 02/12/24 0410 02/13/24 0339  INR 5.3* 3.1* 1.6* 1.3* 1.2      CBG: Recent Labs  Lab 02/15/24 1613 02/15/24 1948 02/16/24 0044 02/16/24 0307 02/16/24 0740  GLUCAP 170* 207* 194* 159* 169*   This patient is critically ill with multiple organ system failure which requires frequent high complexity decision making, assessment, support, evaluation, and titration of therapies. This was completed through the application of advanced monitoring technologies and extensive interpretation of multiple databases. During this encounter critical care time was devoted to patient care services described in this note for 41 minutes.  Leita SHAUNNA Gaskins, DO 02/16/2024 9:03 AM [facility]  For contact information, see Amion. If no response to pager, please call  PCCM consult pager. After hours, 7PM- 7AM, please call Elink.

## 2024-02-16 NOTE — Progress Notes (Signed)
 Patient ID: Tracy Baker, female   DOB: 02-03-1956, 68 y.o.   MRN: [id number]     Advanced Heart Failure Rounding Note  Cardiologist: Lonni Cash, MD  Chief Complaint: S/p MV Repair / MV endocarditis  Patient Profile  Kela Baccari is a 68 y.o. female with hx of recent nephrolithiasis s/p ureteral stent and lithotripsy at [facility] 10/10 with stent removal. 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 Post-op course c/b post cardiotomy shock, junctional bradycardia, afib w/ RVR and HAP.  Subjective:    RHC 12/2 (on 0.125 of Milrinone ): RA 4, PA 42/13 (22), PCW 12 with Vwaves to 20, CO/CI TD 3.37/2.03, PAPi 7.5  12/5 S/P Redo MVR - Intra Op given  4UPRBCs, 2UPLTs, 2FFP, 1Cyro 12/6 Protek Duo cannulation 12/8 Decannulated.  Post-op echo showed EF 50%, RV mildly dilated and mildly dysfunctional, s/p bioprosthetic mitral valve (stable). 12/9 Diuresed with IV lasix  120 mg x1.  12/10 lasix  gtt started.   CVP 19, on lasix  gtt @30 , metolazone  and acetazolamide  yesterday.  I/Os net negative 581.   Currently on dobutamine  5 and off NE.  Co-ox 66%.    She remains on cefepime  and fluconazole .   Creatinine 2.16 => 2.02 but BUN up to 102.   HR in 50s, sinus bradycardia.   Objective:    Weight Range: 80.5 kg Body mass index is 30.46 kg/m.   Vital Signs:   Temp:  [98.1 F (36.7 C)-99.5 F (37.5 C)] 99.5 F (37.5 C) (12/13 0800) Pulse Rate:  [55-105] 85 (12/13 0800) Resp:  [13-30] 18 (12/13 0800) BP: (82-146)/(49-96) 121/81 (12/13 0800) SpO2:  [88 %-100 %] 97 % (12/13 0802) FiO2 (%):  [40 %] 40 % (12/13 0802) Weight:  [80.5 kg] 80.5 kg (12/13 0324) Last BM Date : 02/14/24  Weight change: Filed Weights   02/14/24 0458 02/15/24 0500 02/16/24 0324  Weight: 83.1 kg 79.7 kg 80.5 kg   Intake/Output:  Intake/Output Summary (Last 24 hours) at 02/16/2024 0833 Last data filed at 02/16/2024 0800 Gross per 24 hour  Intake 2263.84 ml  Output 2950 ml   Net -686.16 ml   Physical Exam   General: NAD Neck: JVP 16+, no thyromegaly or thyroid  nodule.  Lungs: Clear to auscultation bilaterally with normal respiratory effort. CV: Nondisplaced PMI.  Heart regular S1/S2, no S3/S4, no murmur.  No peripheral edema.  No carotid bruit.  Normal pedal pulses.  Abdomen: Soft, nontender, no hepatosplenomegaly, no distention.  Skin: Intact without lesions or rashes.  Neurologic: Intubated/sedated.  Extremities: No clubbing or cyanosis.  HEENT: Normal.   Telemetry   Sinus brady 50s (personally reviewed)   Labs    CBC Recent Labs    02/15/24 0524 02/16/24 0515  WBC 9.3 8.6  HGB 12.8 12.2  HCT 39.6 39.3  MCV 95.0 98.5  PLT 59* 61*   Basic Metabolic Panel Recent Labs    87/87/74 0524 02/15/24 1600 02/16/24 0515  NA 142 143 144  K 3.3* 5.2* 4.0  CL 106 109 107  CO2 24 23 25   GLUCOSE 174* 193* 203*  BUN 97* 101* 102*  CREATININE 2.25* 2.16* 2.02*  CALCIUM  8.5* 8.7* 8.3*  MG 2.1  --  2.1  PHOS 4.8*  --  5.2*   Liver Function Tests Recent Labs    02/15/24 0524 02/16/24 0515  ALBUMIN  2.3* 2.3*    Medications:    Scheduled Medications:  Chlorhexidine  Gluconate Cloth  6 each Topical Daily   feeding supplement (PROSource TF20)  60  mL Per Tube Daily   fiber supplement (BANATROL TF)  60 mL Per Tube BID   Gerhardt's butt cream   Topical BID   insulin  aspart  0-9 Units Subcutaneous Q4H   insulin  glargine  15 Units Subcutaneous Daily   metoCLOPramide  (REGLAN ) injection  5 mg Intravenous Q8H   mouth rinse  15 mL Mouth Rinse Q2H   pantoprazole  (PROTONIX ) IV  40 mg Intravenous QHS   scopolamine   1 patch Transdermal Q72H   sodium chloride  flush  10-40 mL Intracatheter Q12H   sodium chloride  flush  10-40 mL Intracatheter Q12H    Infusions:  ceFEPime  (MAXIPIME ) IV Stopped (02/15/24 1849)   dexmedetomidine  0.4 mcg/kg/hr (02/16/24 0801)   DOBUTamine  5 mcg/kg/min (02/16/24 0800)   feeding supplement (VITAL 1.5 CAL) 40 mL/hr at  02/16/24 0800   fentaNYL  infusion INTRAVENOUS Stopped (02/16/24 0357)   fluconazole  (DIFLUCAN ) IV Stopped (02/15/24 1339)   furosemide  (LASIX ) 200 mg in dextrose  5 % 100 mL (2 mg/mL) infusion 30 mg/hr (02/16/24 0800)   norepinephrine  (LEVOPHED ) Adult infusion Stopped (02/16/24 0603)   promethazine  (PHENERGAN ) injection (IM or IVPB)      PRN Medications: albuterol , fentaNYL , mouth rinse, polyethylene glycol, promethazine  (PHENERGAN ) injection (IM or IVPB), sodium chloride  flush, sodium chloride  flush  Assessment/Plan   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 Kerrin for Candida endocarditis. Pre-op L/RHC 01/04/24 with no CAD and normal L/R heart pressures.  - Failed initial Milrinone  wean. Eventually weaned off, unfortunately restarted 12/1 for low output.  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 - Respiratory culture- Enterobacter, On cefepime , vancomycin , and fluconazole . Vancomycin  completed.  - Volume overloaded, have been trying to diurese aggressively with Lasix  gtt 30 mg/hr + metolazone  + acetazolamide .  I/Os net negative 581, BUN up to 100 with creatinine 2.02.  CVP still very high at 19.   - I suspect that this is primarily RV failure.  With intractable volume overload, will plan initiation of CVVH today for volume management.  Hopefully can come off in a couple of days.  Will stop Lasix  when CVVH begun.  - Continue RV support with dobutamine  5.   Acute hypoxic respiratory failure: Suspect component of infection/aspiration PNA given unilateral disease.  - Respiratory culture-  Enterobacter, On cefepime  and fluconazole .  - Needs fluid off before extubation.   Coagulopathy/thrombocytopenia/ABLA anemia:  - Thrombocytopenia present, slowly trending up with platelets 61 today.  Suspect this is inflammatory due to critical illness.  - INR now back to normal, PTT not significantly elevated, no DIC - Hgb goal >8  Sick sinus - Off amiodarone  - Initially with asystole underneath (as of 12/10) - Now stable rhythm, NSR alternating with accelerated junctional.   Afib - Post operative - Hold off on heparin , PLTs 59 => 61  - She is in sinus brady today.  - Will discuss timing of heparin  re-initiation with CCM => will see how she does with HD catheter placement.  If minimal bleeding, may restart tonight.   AKI on CKD 3b - Scr baseline 1.4-1.5 - 2.2 => 2.02 today but BUN now > 100.  - Very difficult to diurese.  - As above, start  CVVH today.   Protein-calorie malnutrition - On TF  Epistaxis  - s/p bilateral nasal packing per ENT 12/1    - Off AC  Pressure Ulcer R Buttock, DTI - Q2 turns, offload  Left radial artery occluded - Left ulnar artery is patent.  - Will ultimately need to restart anticoagulation.    CRITICAL CARE Performed by: Ezra Shuck  Total critical care time: 45 minutes  Critical care time was exclusive of separately billable procedures and treating other patients.  Critical care was necessary to treat or prevent imminent or life-threatening deterioration.  Critical care was time spent personally by me on the following activities: development of treatment plan with patient and/or surrogate as well as nursing, discussions with consultants, evaluation of patient's response to treatment, examination of patient, obtaining history from patient or surrogate, ordering and performing treatments and interventions, ordering and review of laboratory studies, ordering and review of radiographic studies, pulse oximetry and re-evaluation of patient's  condition.  Ezra Shuck 02/16/2024 8:33 AM

## 2024-02-16 NOTE — Progress Notes (Signed)
°   02/16/24 0802  Daily Weaning Assessment  Daily Assessment of Readiness to Wean Wean protocol criteria met (SBT performed)  SBT Method CPAP 5 cm H20 and PS 5 cm H20  Weaning Start Time 0802  Patient response Passed (Tolerated well)

## 2024-02-16 NOTE — Consult Note (Signed)
 Renal Service Consult Note [facility] [name], MD  Patient: Tracy Baker Date: 02/16/2024 Requesting Physician: Dr. MYRTIS Gaskins  Reason for Consult: AKI patient needs CRRT HPI: The patient is a 68 y.o. year-old w/ PMH for nephrolithiasis status post ureteral stent and lithotripsy at [facility] on 12/14/2023, post stent removal. Pt developed severe MR and mitral valve Candida endocarditis at an OSH. Pt left AMA to get closer to home and was admitted at [facility] on 12/29/2023. She underwent mitral valve repair surgery on 01/10/2024.  Patient had redo mitral valve replacement then on 02/08/2024. Since then pt's renal function has worsened somewhat and volume overload is worsening despite high doses of IV lasix  and metolazone . Pt remains vent-dependent. We are asked to see for CRRT.      Pt seen in ICU. Pt on vent and sedated, no hx obtained.    ROS - n/a  Past Medical History  Past Medical History:  Diagnosis Date   Thyroid  disease    Past Surgical History  Past Surgical History:  Procedure Laterality Date   APPENDECTOMY     AUGMENTATION MAMMAPLASTY Bilateral 1981   removed in 2006   BREAST IMPLANT REMOVAL Bilateral 2007   CARDIOVERSION N/A 01/22/2024   Procedure: CARDIOVERSION;  Surgeon: Rolan Ezra RAMAN, MD;  Location: [facility] INVASIVE CV LAB;  Service: Cardiovascular;  Laterality: N/A;   CHOLECYSTECTOMY  2010   COLONOSCOPY WITH PROPOFOL  N/A 06/30/2021   Procedure: COLONOSCOPY WITH PROPOFOL ;  Surgeon: Jinny Carmine, MD;  Location: [facility] ENDOSCOPY;  Service: Endoscopy;  Laterality: N/A;   CONTROL OF EPISTAXIS  02/04/2024   ECMO CANNULATION N/A 02/09/2024   Procedure: ECMO CANNULATION;  Surgeon: Zenaida Morene PARAS, MD;  Location: [facility] INVASIVE CV LAB;  Service: Cardiovascular;  Laterality: N/A;   INTRAOPERATIVE TRANSESOPHAGEAL ECHOCARDIOGRAM N/A 01/10/2024   Procedure: ECHOCARDIOGRAM, TRANSESOPHAGEAL, INTRAOPERATIVE;  Surgeon: Kerrin Elspeth BROCKS, MD;  Location: [facility] OR;  Service:  Open Heart Surgery;  Laterality: N/A;   INTRAOPERATIVE TRANSESOPHAGEAL ECHOCARDIOGRAM N/A 02/08/2024   Procedure: ECHOCARDIOGRAM, TRANSESOPHAGEAL, INTRAOPERATIVE;  Surgeon: Kerrin Elspeth BROCKS, MD;  Location: [facility] OR;  Service: Open Heart Surgery;  Laterality: N/A;   LAPAROSCOPIC TOTAL HYSTERECTOMY  2006   MITRAL VALVE REPAIR N/A 01/10/2024   Procedure: REPAIR, MITRAL VALVE;  Surgeon: Kerrin Elspeth BROCKS, MD;  Location: [facility] OR;  Service: Open Heart Surgery;  Laterality: N/A;   MITRAL VALVE REPLACEMENT N/A 02/08/2024   Procedure: REPLACEMENT, MITRAL VALVE WITH MITRIS RESILIA MITRAL VALVE 27mm;  Surgeon: Kerrin Elspeth BROCKS, MD;  Location: [facility] OR;  Service: Open Heart Surgery;  Laterality: N/A;   REDO STERNOTOMY N/A 02/08/2024   Procedure: REDO STERNOTOMY;  Surgeon: Kerrin Elspeth BROCKS, MD;  Location: [facility] OR;  Service: Open Heart Surgery;  Laterality: N/A;   RIGHT HEART CATH N/A 02/05/2024   Procedure: RIGHT HEART CATH;  Surgeon: Zenaida Morene PARAS, MD;  Location: [facility] INVASIVE CV LAB;  Service: Cardiovascular;  Laterality: N/A;   RIGHT/LEFT HEART CATH AND CORONARY ANGIOGRAPHY N/A 01/04/2024   Procedure: RIGHT/LEFT HEART CATH AND CORONARY ANGIOGRAPHY;  Surgeon: Verlin Lonni JONETTA, MD;  Location: [facility] INVASIVE CV LAB;  Service: Cardiovascular;  Laterality: N/A;   TOOTH EXTRACTION N/A 01/07/2024   Procedure: DENTAL RESTORATION/EXTRACTIONS;  Surgeon: Sheryle Hamilton, DMD;  Location: [facility] OR;  Service: Oral Surgery;  Laterality: N/A;  EXTRACTION OF TEETH #2, #6, #7, #16, #29   TRANSESOPHAGEAL ECHOCARDIOGRAM (CATH LAB) N/A 01/01/2024   Procedure: TRANSESOPHAGEAL ECHOCARDIOGRAM;  Surgeon: Lonni Slain, MD;  Location: [facility] INVASIVE CV LAB;  Service: Cardiovascular;  Laterality: N/A;   TRANSESOPHAGEAL ECHOCARDIOGRAM (CATH LAB) N/A 01/22/2024   Procedure: TRANSESOPHAGEAL ECHOCARDIOGRAM;  Surgeon: Rolan Ezra RAMAN, MD;  Location: [facility] INVASIVE CV LAB;  Service: Cardiovascular;  Laterality: N/A;   Family  History  Family History  Problem Relation Age of Onset   Hypertension Mother    Diabetes Father    Breast cancer Neg Hx    Social History  reports that she has been smoking cigarettes. She has never used smokeless tobacco. She reports current alcohol use. She reports that she does not use drugs. Allergies Allergies[1] Home medications Prior to Admission medications  Medication Sig Start Date End Date Taking? Authorizing Provider  acetaminophen  (TYLENOL ) 325 MG tablet Take 650 mg by mouth daily as needed for fever. 12/14/23  Yes [provider]  clonazePAM  (KLONOPIN ) 0.5 MG tablet Take 0.25-0.5 mg by mouth at bedtime as needed for anxiety. 06/07/21  Yes [provider]  fluconazole  (DIFLUCAN ) IVPB Inject 400 mg into the vein daily for 22 days. Indication:  Candida endocarditis  First Dose: Yes Last Day of Therapy:  02/21/24 Labs - Once weekly:  CBC/D and BMP, Labs - Once weekly: ESR and CRP 01/30/24 02/21/24 Yes Vu, Constance T, MD  ondansetron  (ZOFRAN ) 4 MG tablet Take 1 tablet (4 mg total) by mouth every 8 (eight) hours as needed for nausea or vomiting. 11/08/23  Yes McDonough, Lauren K, PA-C  pantoprazole  (PROTONIX ) 20 MG tablet Take 1 tablet (20 mg total) by mouth daily. 11/08/23  Yes McDonough, Lauren K, PA-C  lovastatin  (MEVACOR ) 20 MG tablet Take 1 tablet (20 mg total) by mouth at bedtime. Patient not taking: Reported on 12/29/2023 07/09/23   Kristina Tinnie POUR, PA-C     Vitals:   02/16/24 1035 02/16/24 1100 02/16/24 1114 02/16/24 1200  BP:  (!) 105/53  (!) 107/55  Pulse: 60 (!) 56  (!) 56  Resp: 15 17  14   Temp: 98.8 F (37.1 C) 98.8 F (37.1 C)  98.6 F (37 C)  TempSrc:      SpO2: 91% 91% 93% 95%  Weight:      Height:       Exam Gen on vent, sedated Sclera anicteric, throat w/ ETT No jvd or bruits Chest clear anterior/ lateral RRR no MRG Abd soft ntnd no mass or ascites +bs GU foley cath Ext difuse 2-3+ tight LE and UE edema Neuro is on vent,  sedated  Date   Creat  eGFR (ml/min) April 2025  0.63 10/24- 01/11/24 0.40- 0.90 11/08- 11/11  1.41 >> 1.02 11/12- 11/14  0.98- 1.00 11/14- 11/17  1.09- 1.39 11/18- 11/30  1.47- 1.86 12/01- 12/05  2.46 >> 1.10 12/06- 12/10  1.70- 2.82 12/11   2.33   12/12   2.16 02/16/24  2.02  26 ml/min  UA pending Renal US  pending     Assessment/ Plan: AKI: b/l creatinine 0.4- 0.8. 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 Pt recently having trouble w/ progressive volume overload and is unable to wean from the ventilator. Has not responded to maximum dose IV lasix  bolus or drips. On exam there is diffuse anasarca. Plan is for CRRT w/ max UF as tolerated.  Fungal endocarditis S/p MV replacement w/ redo MVR on 12/05    Tracy Fret  MD [facility] 02/16/2024, 12:51 PM  Recent Labs  Lab 02/15/24 0524 02/15/24 1600 02/16/24 0515  HGB 12.8  --  12.2  ALBUMIN  2.3*  --  2.3*  CALCIUM  8.5* 8.7* 8.3*  PHOS 4.8*  --  5.2*  CREATININE 2.25* 2.16* 2.02*  K 3.3* 5.2* 4.0   Inpatient medications:  feeding supplement (PROSource TF20)  60 mL Per Tube Daily   fiber supplement (BANATROL TF)  60 mL Per Tube BID   Gerhardt's butt cream   Topical BID   insulin  aspart  0-9 Units Subcutaneous Q4H   insulin  glargine  15 Units Subcutaneous Daily   metoCLOPramide  (REGLAN ) injection  5 mg Intravenous Q8H   mouth rinse  15 mL Mouth Rinse Q2H   pantoprazole  (PROTONIX ) IV  40 mg Intravenous QHS   polyethylene glycol  17 g Oral Daily   scopolamine   1 patch Transdermal Q72H   sodium chloride  flush  10-40 mL Intracatheter Q12H   sodium chloride  flush  10-40 mL Intracatheter Q12H   sodium chloride  flush  10-40 mL Intracatheter Q12H    ceFEPime  (MAXIPIME ) IV Stopped (02/15/24 1849)   dexmedetomidine  0.6 mcg/kg/hr (02/16/24 1200)   DOBUTamine  5 mcg/kg/min (02/16/24 1200)   feeding supplement (VITAL 1.5 CAL) 50 mL/hr at 02/16/24 1200    fentaNYL  infusion INTRAVENOUS Stopped (02/16/24 0357)   fluconazole  (DIFLUCAN ) IV Stopped (02/16/24 1157)   furosemide  (LASIX ) 200 mg in dextrose  5 % 100 mL (2 mg/mL) infusion 30 mg/hr (02/16/24 1200)   norepinephrine  (LEVOPHED ) Adult infusion Stopped (02/16/24 0603)   promethazine  (PHENERGAN ) injection (IM or IVPB)     albuterol , fentaNYL , mouth rinse, polyethylene glycol, promethazine  (PHENERGAN ) injection (IM or IVPB), sodium chloride  flush, sodium chloride  flush, sodium chloride  flush      [1]  Allergies Allergen Reactions   Codeine Anaphylaxis and Nausea And Vomiting   Bactoshield Chg [Chlorhexidine  Gluconate] Dermatitis   Red Dye #40 (Allura Red) Nausea Only

## 2024-02-16 NOTE — Progress Notes (Signed)
 7 Days Post-Op Procedures (LRB): ECMO CANNULATION (N/A) Subjective: Intubated, responsive, intermittently agitated  Objective: Vital signs in last 24 hours: Temp:  [98.1 F (36.7 C)-99.3 F (37.4 C)] 99.3 F (37.4 C) (12/13 0745) Pulse Rate:  [55-105] 62 (12/13 0745) Cardiac Rhythm: Junctional rhythm (12/13 0347) Resp:  [13-30] 20 (12/13 0745) BP: (82-146)/(49-96) 113/68 (12/13 0745) SpO2:  [88 %-100 %] 97 % (12/13 0745) FiO2 (%):  [40 %] 40 % (12/13 0450) Weight:  [80.5 kg] 80.5 kg (12/13 0324)  Hemodynamic parameters for last 24 hours: CVP:  [15 mmHg-26 mmHg] 23 mmHg  Intake/Output from previous day: 12/12 0701 - 12/13 0700 In: 2319.1 [I.V.:756; NG/GT:1113; IV Piggyback:450.2] Out: 2900 [Urine:2900] Intake/Output this shift: Total I/O In: 86.3 [I.V.:38.3; NG/GT:48] Out: -   General appearance: ill-appearing Neurologic: nonfocal Heart: regularly irregular rhythm Lungs: rhonchi R>L Abdomen: normal findings: soft, non-tender  Lab Results: Recent Labs    02/15/24 0524 02/16/24 0515  WBC 9.3 8.6  HGB 12.8 12.2  HCT 39.6 39.3  PLT 59* 61*   BMET:  Recent Labs    02/15/24 1600 02/16/24 0515  NA 143 144  K 5.2* 4.0  CL 109 107  CO2 23 25  GLUCOSE 193* 203*  BUN 101* 102*  CREATININE 2.16* 2.02*  CALCIUM  8.7* 8.3*    PT/INR: No results for input(s): LABPROT, INR in the last 72 hours. ABG    Component Value Date/Time   PHART 7.340 (L) 02/13/2024 1618   HCO3 18.0 (L) 02/13/2024 1618   TCO2 19 (L) 02/13/2024 1618   ACIDBASEDEF 7.0 (H) 02/13/2024 1618   O2SAT 66 02/16/2024 0513   CBG (last 3)  Recent Labs    02/16/24 0044 02/16/24 0307 02/16/24 0740  GLUCAP 194* 159* 169*    Assessment/Plan: S/P Procedures (LRB): ECMO CANNULATION (N/A) NEURO_ continue to minimize sedation as much as possible CV- in bigeminy currently  Co-ox 66 on dobutamine  at 5, norepi off RESP_- VDRF enterobacter pneumonia  Concern for ability to clear secretions if  extubated  Will eval with CCM  Still has thick gray secretions with suctioning ID- on Vanco and Maxipime   Diflucan  for Candida RENAL- creatinine continues to improve- down to 2.0  On Lasix  drip + Diamox  ENDO- CBG elevated with TF  Start Lantus , continue q4 SSI GI- tolerating tube feedings  On Reglan , PRN Phenergan  for nausea Thrombocytopenia- stable SCD for DVT prophylaxis   LOS: 45 days    Tracy Baker 02/16/2024

## 2024-02-16 NOTE — Procedures (Signed)
 I have reviewed the CRRT procedure and made adjustments as needed.  Myer Fret MD  [facility] 02/16/2024, 10:57 PM

## 2024-02-16 NOTE — Procedures (Signed)
 Central Venous Catheter Insertion Procedure Note  Tracy Baker  [id number]  05-27-1955  Date:02/16/2024  Time:4:11 PM   Provider Performing:Lissandra Keil   Procedure: Insertion of Non-tunneled Central Venous Catheter(36556)with US  guidance (23062)    Indication(s) Hemodialysis  Consent Risks of the procedure as well as the alternatives and risks of each were explained to the patient and/or caregiver.  Consent for the procedure was obtained and is signed in the bedside chart  Anesthesia Topical only with 1% lidocaine    Timeout Verified patient identification, verified procedure, site/side was marked, verified correct patient position, special equipment/implants available, medications/allergies/relevant history reviewed, required imaging and test results available.  Sterile Technique Maximal sterile technique including full sterile barrier drape, hand hygiene, sterile gown, sterile gloves, mask, hair covering, sterile ultrasound probe cover (if used).  Procedure Description Area of catheter insertion was cleaned with chlorhexidine  and draped in sterile fashion.   With real-time ultrasound guidance a HD catheter was placed into the left internal jugular vein.  Nonpulsatile blood flow and easy flushing noted in all ports.  The catheter was sutured in place and sterile dressing applied.  Complications/Tolerance None; patient tolerated the procedure well. Chest X-ray is ordered to verify placement for internal jugular or subclavian cannulation.  Chest x-ray is not ordered for femoral cannulation.  EBL Minimal  Specimen(s) None

## 2024-02-17 DIAGNOSIS — R570 Cardiogenic shock: Secondary | ICD-10-CM | POA: Diagnosis not present

## 2024-02-17 DIAGNOSIS — N183 Chronic kidney disease, stage 3 unspecified: Secondary | ICD-10-CM | POA: Diagnosis not present

## 2024-02-17 DIAGNOSIS — N179 Acute kidney failure, unspecified: Secondary | ICD-10-CM | POA: Diagnosis not present

## 2024-02-17 DIAGNOSIS — I340 Nonrheumatic mitral (valve) insufficiency: Secondary | ICD-10-CM | POA: Diagnosis not present

## 2024-02-17 DIAGNOSIS — J9601 Acute respiratory failure with hypoxia: Secondary | ICD-10-CM | POA: Diagnosis not present

## 2024-02-17 DIAGNOSIS — B377 Candidal sepsis: Secondary | ICD-10-CM | POA: Diagnosis not present

## 2024-02-17 LAB — RENAL FUNCTION PANEL
Albumin: 2.5 g/dL — ABNORMAL LOW (ref 3.5–5.0)
Albumin: 2.4 g/dL — ABNORMAL LOW (ref 3.5–5.0)
Albumin: 2.6 g/dL — ABNORMAL LOW (ref 3.5–5.0)
Anion gap: 11 (ref 5–15)
Anion gap: 7 (ref 5–15)
Anion gap: 13 (ref 5–15)
BUN: 58 mg/dL — ABNORMAL HIGH (ref 8–23)
BUN: 41 mg/dL — ABNORMAL HIGH (ref 8–23)
BUN: 42 mg/dL — ABNORMAL HIGH (ref 8–23)
CO2: 29 mmol/L (ref 22–32)
CO2: 26 mmol/L (ref 22–32)
CO2: 22 mmol/L (ref 22–32)
Calcium: 8.4 mg/dL — ABNORMAL LOW (ref 8.9–10.3)
Calcium: 7.7 mg/dL — ABNORMAL LOW (ref 8.9–10.3)
Calcium: 8.4 mg/dL — ABNORMAL LOW (ref 8.9–10.3)
Chloride: 101 mmol/L (ref 98–111)
Chloride: 97 mmol/L — ABNORMAL LOW (ref 98–111)
Chloride: 105 mmol/L (ref 98–111)
Creatinine, Ser: 1.12 mg/dL — ABNORMAL HIGH (ref 0.44–1.00)
Creatinine, Ser: 1.02 mg/dL — ABNORMAL HIGH (ref 0.44–1.00)
Creatinine, Ser: 1 mg/dL (ref 0.44–1.00)
GFR, Estimated: 54 mL/min — ABNORMAL LOW (ref >60)
GFR, Estimated: 60 mL/min — ABNORMAL LOW (ref >60)
GFR, Estimated: >60 mL/min (ref >60)
Glucose, Bld: 185 mg/dL — ABNORMAL HIGH (ref 70–99)
Glucose, Bld: 429 mg/dL — ABNORMAL HIGH (ref 70–99)
Glucose, Bld: 181 mg/dL — ABNORMAL HIGH (ref 70–99)
Phosphorus: 2.7 mg/dL (ref 2.5–4.6)
Phosphorus: 2.5 mg/dL (ref 2.5–4.6)
Phosphorus: 2.7 mg/dL (ref 2.5–4.6)
Potassium: 4.2 mmol/L (ref 3.5–5.1)
Potassium: 4.5 mmol/L (ref 3.5–5.1)
Potassium: 4.7 mmol/L (ref 3.5–5.1)
Sodium: 141 mmol/L (ref 135–145)
Sodium: 130 mmol/L — ABNORMAL LOW (ref 135–145)
Sodium: 140 mmol/L (ref 135–145)

## 2024-02-17 LAB — HEPATIC FUNCTION PANEL
ALT: 12 U/L (ref 0–44)
AST: 27 U/L (ref 15–41)
Albumin: 2.5 g/dL — ABNORMAL LOW (ref 3.5–5.0)
Alkaline Phosphatase: 209 U/L — ABNORMAL HIGH (ref 38–126)
Bilirubin, Direct: 0.5 mg/dL — ABNORMAL HIGH (ref 0.0–0.2)
Indirect Bilirubin: 0.8 mg/dL (ref 0.3–0.9)
Total Bilirubin: 1.3 mg/dL — ABNORMAL HIGH (ref 0.0–1.2)
Total Protein: 5.9 g/dL — ABNORMAL LOW (ref 6.5–8.1)

## 2024-02-17 LAB — HEPARIN LEVEL (UNFRACTIONATED): Heparin Unfractionated: 0.11 [IU]/mL — ABNORMAL LOW (ref 0.30–0.70)

## 2024-02-17 LAB — CBC
HCT: 39.3 % (ref 36.0–46.0)
Hemoglobin: 12.4 g/dL (ref 12.0–15.0)
MCH: 31.6 pg (ref 26.0–34.0)
MCHC: 31.6 g/dL (ref 30.0–36.0)
MCV: 100 fL (ref 80.0–100.0)
Platelets: 64 K/uL — ABNORMAL LOW (ref 150–400)
RBC: 3.93 MIL/uL (ref 3.87–5.11)
RDW: 20.4 % — ABNORMAL HIGH (ref 11.5–15.5)
WBC: 12.1 K/uL — ABNORMAL HIGH (ref 4.0–10.5)
nRBC: 0.2 % (ref 0.0–0.2)

## 2024-02-17 LAB — GLUCOSE, CAPILLARY
Glucose-Capillary: 154 mg/dL — ABNORMAL HIGH (ref 70–99)
Glucose-Capillary: 157 mg/dL — ABNORMAL HIGH (ref 70–99)
Glucose-Capillary: 159 mg/dL — ABNORMAL HIGH (ref 70–99)
Glucose-Capillary: 160 mg/dL — ABNORMAL HIGH (ref 70–99)
Glucose-Capillary: 161 mg/dL — ABNORMAL HIGH (ref 70–99)
Glucose-Capillary: 162 mg/dL — ABNORMAL HIGH (ref 70–99)
Glucose-Capillary: 174 mg/dL — ABNORMAL HIGH (ref 70–99)
Glucose-Capillary: 192 mg/dL — ABNORMAL HIGH (ref 70–99)

## 2024-02-17 LAB — COOXEMETRY PANEL
Carboxyhemoglobin: 1.2 % (ref 0.5–1.5)
Methemoglobin: <0.7 % (ref 0.0–1.5)
O2 Saturation: 55.6 %
Total hemoglobin: 13 g/dL (ref 12.0–16.0)

## 2024-02-17 LAB — MAGNESIUM: Magnesium: 2.2 mg/dL (ref 1.7–2.4)

## 2024-02-17 MED ORDER — POLYETHYLENE GLYCOL 3350 17 G PO PACK
17.0000 g | PACK | Freq: Every day | ORAL | Status: DC
  Administered 2024-02-19: 11:00:00 17 g
  Filled 2024-02-17: qty 1

## 2024-02-17 MED ORDER — K PHOS MONO-SOD PHOS DI & MONO 155-852-130 MG PO TABS
250.0000 mg | ORAL_TABLET | ORAL | Status: AC
  Administered 2024-02-17 (×4): 250 mg
  Filled 2024-02-17 (×4): qty 1

## 2024-02-17 MED ORDER — QUETIAPINE FUMARATE 25 MG PO TABS
12.5000 mg | ORAL_TABLET | Freq: Two times a day (BID) | ORAL | Status: DC
  Administered 2024-02-17 (×2): 12.5 mg
  Filled 2024-02-17 (×3): qty 1

## 2024-02-17 MED ORDER — HEPARIN (PORCINE) 25000 UT/250ML-% IV SOLN
750.0000 [IU]/h | INTRAVENOUS | Status: DC
  Administered 2024-02-17 – 2024-02-19 (×2): 500 [IU]/h via INTRAVENOUS
  Administered 2024-02-20: 19:00:00 950 [IU]/h via INTRAVENOUS
  Administered 2024-02-22: 1000 [IU]/h via INTRAVENOUS
  Administered 2024-02-23: 1100 [IU]/h via INTRAVENOUS
  Administered 2024-02-24: 1150 [IU]/h via INTRAVENOUS
  Administered 2024-02-24: 1100 [IU]/h via INTRAVENOUS
  Filled 2024-02-17 (×7): qty 250

## 2024-02-17 MED ORDER — INSULIN ASPART 100 UNIT/ML IJ SOLN
2.0000 [IU] | INTRAMUSCULAR | Status: DC
  Administered 2024-02-17 – 2024-02-20 (×14): 2 [IU] via SUBCUTANEOUS
  Filled 2024-02-17 (×12): qty 2

## 2024-02-17 NOTE — Progress Notes (Signed)
 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 Kerrin. PCCM consulted for ICU post-op vent management.  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 Kerrin 12/6: Overnight patient started requiring more vasopressor support, early morning she was on dobutamine  5, milrinone  0.375, epinephrine  at 10, vasopressin  0.04 and norepinephrine  34 mics, she is not making urine.  Cannulated with Protek duo for RV support,  12/7 bronch, broadened antibiotics 12/8 decannulated from VV ECMO after sweep and flow wean trials. Lasix  stopped. Restarted TF. 12/10 chest tubes out, off pressors. Aline removed. 12/13 started CRRT Interim History / Subjective:  Tmax 100.2. Back on NE to tolerated CRRT and increased sedation requirements. This morning she denies complaints.   Objective    Blood pressure (!) 96/51, pulse 68, temperature 100.2 F (37.9 C), resp. rate (!) 22, height 5' 4 (1.626 m), weight 77.9 kg, SpO2 95%. CVP:  [10 mmHg-27 mmHg] 10 mmHg  Vent Mode: PRVC FiO2 (%):  [40 %] 40 % Set Rate:  [22 bmp] 22 bmp Vt Set:  [440 mL] 440 mL PEEP:  [5 cmH20] 5 cmH20 Pressure Support:  [5 cmH20] 5 cmH20   Intake/Output Summary (Last 24 hours) at 02/17/2024 0704 Last data filed at 02/17/2024  0600 Gross per 24 hour  Intake 2925.75 ml  Output 5123 ml  Net -2197.25 ml   Filed Weights   02/15/24 0500 02/16/24 0324 02/17/24 0430  Weight: 79.7 kg 80.5 kg 77.9 kg     Physical exam: General: critically ill appearing woman lying in bed in NAD HEENT: [place]/AT, eyes anicteric, ETT Neck: R neck improving, LIJ HD catheter w/o bleeding Neuro:  RASS 0, tracking, following commands more briskly. Still weak but improving some.  Chest: less secretions from ETT, slightly stronger cough-still fairly weak. No rhonchi. Heart: S1S2, RRR Abdomen: soft, NT, nondistended Extremities: hands both warm, ++edema   I/O -2.4L , net -100 cc for admission  Coox 56% Na+ 141 K+ 4.2 BUN 58, Cr 1.12 on CRRT WBC 12.1 H/H 12.4/39.3 Platelets 64  Repeat trach aspirate culture 12/13: moderate PMN, no organisms>    Patient Lines/Drains/Airways Status     Active Line/Drains/Airways     Name Placement date Placement time Site Days   PICC Double Lumen 01/15/24 Right Basilic 35 cm 0 cm 01/15/24  8384  -- 33   Hemodialysis Catheter Left Internal jugular Triple lumen Temporary (Non-Tunneled) 02/16/24  1600  Internal jugular  1   Urethral Catheter [name], RN Latex;Straight-tip;Temperature probe 14 Fr. 02/08/24  0800  Latex;Straight-tip;Temperature probe  9   Airway 8 mm 02/08/24  0800  -- 9   Small Bore Feeding Tube Left nare Marking at nare/corner of mouth 67 cm 02/15/24  1135  Left nare  2   Wound 01/16/24 0800 Traumatic Back Left;Upper 01/16/24  0800  Back  32   Wound 02/08/24 Surgical Closed Surgical Incision Sternum Mid 02/08/24  --  Sternum  9   Wound 02/12/24 1800 Pressure Injury Buttocks Right;Medial Deep Tissue Pressure Injury - Purple or maroon localized area of discolored intact skin or blood-filled blister due to damage of underlying soft tissue from pressure and/or shear. 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 If still in the hospital at this time, reconsult ID (they wanted to see her before going off antifungals as OP) -Eventually needs to start coumadin once platelets are improving. Start fixed dose heparin  today.  Shock, mixed cardiogenic and distributive; continues to improve -con't cefepime ; follow up respiratory culture but gram stain is reassuring despite having very heavy ETT secretions recently -DBA  Acute respiratory failure with hypoxia RUL pneumonia- Enterobacter -LTVV -VAP prevention protocol -PAD protocol for sedation- goal RASS 0 to -1 -SAT & SBT; was able to tolerate ~2h of 5/5 today but CVP was still high and secretions still a concern, so trying to pull more volume before trial of extubation> hopeful for tomorrow morning  AKI on CKD stage IIIa, Cr plateau, but BUN still rising -strict I/O -renally dose meds, avoid nephrotoxic meds -appreciate nephrology's management -monitor UOP, f/c foley if UOP stops  Hypoglycemia improved; now mild hyperglycemia on TF H/o prediabetes, A1c 6 -SSI PRN -adding aspart 2 units q4h TF coverage -con't glargine 15 units daily -goal BG 140-180  Ileus- resolved; no issues with constipation -d/c reglan  after extubation -miralax  daily  Acute blood loss anemia status post multiple PRBC transfusion Thrombocytopenia due to critical illness status post 2 unit platelets Coagulopathy, probably from liver  congestion requiring FFP transfusion -transfuse for Hb <7 or hemodynamically significant bleeding -with platelets slowly recovering, ok to start fixed dose heparin  gtt  Severe protein calorie malnutrition -con't TF at goal via cortrak -anticipate she will need supplemental nutrition for a while even once she is eating given her long duration of critical illness  Hyperbilirubinemia; unclear etiology, stable  -periodically monitor  Deconditioning, prolonged course of illness leading up to this  -PT, OT -anticipate she will need CIR  Pressure ulcer on buttocks; skin breakdown from bandage on R neck -optimizing nutrition -off pressors -turns -need to start mobilizing her as she can tolerate -chlorhexidine  added to allergy list from R neck, but may have also been adhesive that caused skin breakdown  Left hand cool> resolved; has thrombus in L radial artery -fixed dose heparin  -con't watching perfusion in left hand;   Daughter [name] updated via phone today.   Labs:  CBC: Recent Labs  Lab 02/13/24 0339 02/13/24 0741 02/13/24 1618 02/14/24 0339 02/15/24 0524 02/16/24 0515 02/17/24 0423  WBC 8.5  --   --  10.3 9.3 8.6 12.1*  HGB 14.2   < > 15.0 13.5 12.8 12.2 12.4  HCT 43.2   < > 44.0  42.4 39.6 39.3 39.3  MCV 92.1  --   --  95.9 95.0 98.5 100.0  PLT 45*  --   --  40* 59* 61* 64*   < > = values in this interval not displayed.    Basic Metabolic Panel: Recent Labs  Lab 02/13/24 0339 02/13/24 0741 02/14/24 0339 02/14/24 1741 02/15/24 0524 02/15/24 1600 02/16/24 0515 02/17/24 0423  NA 140   < > 143 141 142 143 144 141  K 3.2*   < > 3.9 4.1 3.3* 5.2* 4.0 4.2  CL 107   < > 106 107 106 109 107 101  CO2 21*   < > 22 24 24 23 25 29   GLUCOSE 127*   < > 149* 165* 174* 193* 203* 185*  BUN 67*   < > 88* 91* 97* 101* 102* 58*  CREATININE 2.82*   < > 2.33* 2.37* 2.25* 2.16* 2.02* 1.12*  CALCIUM  7.9*   < > 8.5* 8.8* 8.5* 8.7* 8.3* 8.4*  MG 2.3  --  2.2  --  2.1  --  2.1  2.2  PHOS 5.9*  --  5.1*  --  4.8*  --  5.2* 2.7   < > = values in this interval not displayed.   GFR: Estimated Creatinine Clearance: 48.6 mL/min (A) (by C-G formula based on SCr of 1.12 mg/dL (H)). Recent Labs  Lab 02/10/24 1853 02/11/24 0509 02/11/24 0515 02/11/24 9161 02/11/24 1528 02/12/24 0822 02/12/24 1346 02/14/24 0339 02/15/24 0524 02/16/24 0515 02/17/24 0423  WBC  --    < >  --   --    < >  --    < > 10.3 9.3 8.6 12.1*  LATICACIDVEN 1.1  --  1.3 1.2  --  1.2  --   --   --   --   --    < > = values in this interval not displayed.    Liver Function Tests: Recent Labs  Lab 02/10/24 1710 02/11/24 0509 02/12/24 0410 02/13/24 0339 02/14/24 0339 02/15/24 0524 02/16/24 0515 02/17/24 0423  AST 56* 52* 26 18  --   --   --  27  ALT 5 <5 <5 <5  --   --   --  12  ALKPHOS 83 86 99 105  --   --   --  209*  BILITOT 1.1 1.4* 1.4* 1.4*  --   --   --  1.3*  PROT 4.7* 4.6* 4.8* 4.8*  --   --   --  5.9*  ALBUMIN  3.0* 2.7* 2.6* 2.4* 2.4* 2.3* 2.3* 2.5*  2.5*   ABG    Component Value Date/Time   PHART 7.340 (L) 02/13/2024 1618   PCO2ART 33.4 02/13/2024 1618   PO2ART 89 02/13/2024 1618   HCO3 18.0 (L) 02/13/2024 1618   TCO2 19 (L) 02/13/2024 1618   ACIDBASEDEF 7.0 (H) 02/13/2024 1618   O2SAT 55.6 02/17/2024 0422    Coagulation Profile: Recent Labs  Lab 02/10/24 1239 02/11/24 0509 02/12/24 0410 02/13/24 0339  INR 3.1* 1.6* 1.3* 1.2      CBG: Recent Labs  Lab 02/16/24 1205 02/16/24 1537 02/16/24 1916 02/17/24 0000 02/17/24 0421  GLUCAP 152* 193* 133* 157* 174*   This patient is critically ill with multiple organ system failure which requires frequent high complexity decision making, assessment, support, evaluation, and titration of therapies. This was completed through the application of advanced monitoring technologies and extensive interpretation of multiple databases. During this encounter critical care time was  devoted to patient care services described  in this note for 46 minutes.  Leita SHAUNNA Gaskins, DO 02/17/2024 4:48 PM [facility]  For contact information, see Amion. If no response to pager, please call PCCM consult pager. After hours, 7PM- 7AM, please call Elink.

## 2024-02-17 NOTE — Progress Notes (Signed)
 PHARMACY - ANTICOAGULATION CONSULT NOTE  Pharmacy Consult for heparin  Indication: atrial fibrillation  Allergies[1]  Patient Measurements: Height: 5' 4 (162.6 cm) Weight: 77.9 kg (171 lb 11.8 oz) IBW/kg (Calculated) : 54.7 HEPARIN  DW (KG): 66.5  Vital Signs: Temp: 100.2 F (37.9 C) (12/14 0800) Temp Source: Bladder (12/14 0800) BP: 101/56 (12/14 0800) Pulse Rate: 110 (12/14 0745)  Labs: Recent Labs    02/15/24 0524 02/15/24 1600 02/16/24 0515 02/17/24 0423  HGB 12.8  --  12.2 12.4  HCT 39.6  --  39.3 39.3  PLT 59*  --  61* 64*  CREATININE 2.25* 2.16* 2.02* 1.12*    Estimated Creatinine Clearance: 48.6 mL/min (A) (by C-G formula based on SCr of 1.12 mg/dL (H)).   Medical History: Past Medical History:  Diagnosis Date   Thyroid  disease     Medications:  Scheduled:   feeding supplement (PROSource TF20)  60 mL Per Tube Daily   fiber supplement (BANATROL TF)  60 mL Per Tube BID   Gerhardt's butt cream   Topical BID   insulin  aspart  0-9 Units Subcutaneous Q4H   insulin  glargine  15 Units Subcutaneous Daily   metoCLOPramide  (REGLAN ) injection  5 mg Intravenous Q8H   mouth rinse  15 mL Mouth Rinse Q2H   pantoprazole  (PROTONIX ) IV  40 mg Intravenous QHS   phosphorus  250 mg Per Tube Q4H   polyethylene glycol  17 g Oral Daily   scopolamine   1 patch Transdermal Q72H   sodium chloride  flush  10-40 mL Intracatheter Q12H    Assessment: Tracy Baker is a 25 YOF now s/p MV repair and redo on 12/5. Hospitalization complicated by acute RV failure with cardiogenic shock s/p Protek duo VV ECMO cannula. Patient also with atrial fibrillation throughout this hospitalization. PLT have been low this week, slightly improving today (02/17/24). Discussed with team, plan to initiate low-dose heparin  infusion. Pharmacy consulted for heparin  dosing.   02/17/24: Hgb 12.4, PLT up to 64. Per discussion with AHF team, plan to initiate heparin  at flat rate and will not titrate further  today.   Goal of Therapy:  Heparin  level <0.10 units/ml Monitor platelets by anticoagulation protocol: Yes   Plan:  Heparin  500 units/hr - do not titrate Monitor heparin  level in 8 hours Monitor heparin  level, CBC, and s/sx of bleeding daily F/u plans for heparin  titrations  Tracy Baker, PharmD, BCPS PGY2 Cardiology Pharmacy Resident 02/17/2024 9:03 AM     [1]  Allergies Allergen Reactions   Codeine Anaphylaxis and Nausea And Vomiting   Bactoshield Chg [Chlorhexidine  Gluconate] Dermatitis   Chlorhexidine  Dermatitis and Rash   Red Dye #40 (Allura Red) Nausea Only

## 2024-02-17 NOTE — Progress Notes (Signed)
° °  [address] [place] [employer] [address]             [phone]   Intubated  BP 102/60   Pulse (!) 105   Temp (!) 100.4 F (38 C)   Resp 17   Ht 5' 4 (1.626 m)   Wt 77.9 kg   SpO2 98%   BMI 29.48 kg/m   CVp unreliable waveform went from 3 to 20 with -3L   Intake/Output Summary (Last 24 hours) at 02/17/2024 1820 Last data filed at 02/17/2024 1800 Gross per 24 hour  Intake 2961.73 ml  Output 6681.9 ml  Net -3720.17 ml   Hopefully can push to extubate tomorrow  Elspeth C. Kerrin, MD [facility] [phone]

## 2024-02-17 NOTE — Progress Notes (Signed)
 8 Days Post-Op Procedures (LRB): ECMO CANNULATION (N/A) Subjective: More alert this morning, following commands but still very weak  Objective: Vital signs in last 24 hours: Temp:  [98.6 F (37 C)-100.4 F (38 C)] 100.2 F (37.9 C) (12/14 0800) Pulse Rate:  [55-238] 110 (12/14 0745) Cardiac Rhythm: Sinus tachycardia;Atrial fibrillation;Junctional rhythm (12/14 0800) Resp:  [10-29] 24 (12/14 0800) BP: (75-163)/(34-115) 101/56 (12/14 0800) SpO2:  [71 %-98 %] 95 % (12/14 0738) FiO2 (%):  [40 %] 40 % (12/14 0800) Weight:  [77.9 kg] 77.9 kg (12/14 0430)  Hemodynamic parameters for last 24 hours: CVP:  [5 mmHg-27 mmHg] 5 mmHg  Intake/Output from previous day: 12/13 0701 - 12/14 0700 In: 3026.3 [I.V.:1053.6; NG/GT:1622.7; IV Piggyback:350] Out: 5415.9 [Urine:2505] Intake/Output this shift: Total I/O In: 86 [I.V.:36; NG/GT:50] Out: 282.4 [Urine:30]  General appearance: cooperative and no distress Neurologic: intact Heart: slightly irregular Lungs: clear to auscultation bilaterally Wound: intact  Lab Results: Recent Labs    02/16/24 0515 02/17/24 0423  WBC 8.6 12.1*  HGB 12.2 12.4  HCT 39.3 39.3  PLT 61* 64*   BMET:  Recent Labs    02/16/24 0515 02/17/24 0423  NA 144 141  K 4.0 4.2  CL 107 101  CO2 25 29  GLUCOSE 203* 185*  BUN 102* 58*  CREATININE 2.02* 1.12*  CALCIUM  8.3* 8.4*    PT/INR: No results for input(s): LABPROT, INR in the last 72 hours. ABG    Component Value Date/Time   PHART 7.340 (L) 02/13/2024 1618   HCO3 18.0 (L) 02/13/2024 1618   TCO2 19 (L) 02/13/2024 1618   ACIDBASEDEF 7.0 (H) 02/13/2024 1618   O2SAT 55.6 02/17/2024 0422   CBG (last 3)  Recent Labs    02/17/24 0000 02/17/24 0421 02/17/24 0723  GLUCAP 157* 174* 192*    Assessment/Plan: S/P Procedures (LRB): ECMO CANNULATION (N/A) - NEURO- more alert, no focal deficit CV- in Sr with episodes of atrial fib   Will start low dose anticoagulation today  Full dose when  extubated  Remains on dobutamine  5 and norepi 3 RESP- VDRF secondary to enterobacter pneumonia  She is close to ready for extubation either later today or tomorrow  Remains on cefepime  for enterobacter pneumonia ID- low grade temp- monitor RENAL- started on CRRT for uremia yesterday  2.3 L negative and BUN down to 58  Possibly continue 1 more day- will d/w CCM and Renal ENDO- CBG moderately elevated on TF  Continue SSI GI- tolerating TF  Protein calorie malnutrition- moderate SCD Thrombocytopenia- stable  LOS: 46 days    Tracy Baker 02/17/2024

## 2024-02-17 NOTE — Progress Notes (Signed)
 PHARMACY - ANTICOAGULATION CONSULT NOTE  Pharmacy Consult for heparin  Indication: atrial fibrillation  Allergies[1]  Patient Measurements: Height: 5' 4 (162.6 cm) Weight: 77.9 kg (171 lb 11.8 oz) IBW/kg (Calculated) : 54.7 HEPARIN  DW (KG): 66.5  Vital Signs: Temp: 100.4 F (38 C) (12/14 1925) Temp Source: Bladder (12/14 1600) BP: 110/66 (12/14 1900) Pulse Rate: 119 (12/14 1925)  Labs: Recent Labs    02/15/24 0524 02/15/24 1600 02/16/24 0515 02/17/24 0423 02/17/24 1617 02/17/24 1814  HGB 12.8  --  12.2 12.4  --   --   HCT 39.6  --  39.3 39.3  --   --   PLT 59*  --  61* 64*  --   --   HEPARINUNFRC  --   --   --   --   --  0.11*  CREATININE 2.25*   < > 2.02* 1.12* 1.02* 1.00   < > = values in this interval not displayed.    Estimated Creatinine Clearance: 54.4 mL/min (by C-G formula based on SCr of 1 mg/dL).   Medical History: Past Medical History:  Diagnosis Date   Thyroid  disease     Medications:  Scheduled:   feeding supplement (PROSource TF20)  60 mL Per Tube Daily   fiber supplement (BANATROL TF)  60 mL Per Tube BID   Gerhardt's butt cream   Topical BID   insulin  aspart  0-9 Units Subcutaneous Q4H   insulin  aspart  2 Units Subcutaneous Q4H   insulin  glargine  15 Units Subcutaneous Daily   metoCLOPramide  (REGLAN ) injection  5 mg Intravenous Q8H   mouth rinse  15 mL Mouth Rinse Q2H   pantoprazole  (PROTONIX ) IV  40 mg Intravenous QHS   polyethylene glycol  17 g Per Tube Daily   QUEtiapine   12.5 mg Per Tube BID   scopolamine   1 patch Transdermal Q72H   sodium chloride  flush  10-40 mL Intracatheter Q12H    Assessment: Tracy Baker is a 73 YOF now s/p MV repair and redo on 12/5. Hospitalization complicated by acute RV failure with cardiogenic shock s/p Protek duo VV ECMO cannula. Patient also with atrial fibrillation throughout this hospitalization. PLT have been low this week, slightly improving today (02/17/24). Discussed with team, plan to initiate  low-dose heparin  infusion. Pharmacy consulted for heparin  dosing.   Heparin  level: 0.11 No issues with bleeding or infusion per RN  Goal of Therapy:  Heparin  level <0.10 units/ml Monitor platelets by anticoagulation protocol: Yes   Plan:  Continue heparin  500 units/hr - do not titrate Monitor heparin  level, CBC, and s/sx of bleeding daily F/u plans for heparin  titrations (likely increase 12/15 if no bleeding issues)  Thank you for allowing pharmacy to participate in this patient's care.  Leonor GORMAN Bash, PharmD Clinical Pharmacist 02/17/2024,8:08 PM       [1]  Allergies Allergen Reactions   Codeine Anaphylaxis and Nausea And Vomiting   Bactoshield Chg [Chlorhexidine  Gluconate] Dermatitis   Chlorhexidine  Dermatitis and Rash   Red Dye #40 (Allura Red) Nausea Only

## 2024-02-17 NOTE — Progress Notes (Signed)
°   02/17/24 0738  Daily Weaning Assessment  Daily Assessment of Readiness to Wean Wean protocol criteria met (SBT performed)  SBT Method CPAP 5 cm H20 and PS 5 cm H20  Weaning Start Time [phone]  Patient response Passed (Tolerated well)

## 2024-02-17 NOTE — Progress Notes (Signed)
  [facility] Progress Note  Subjective:  2.5 L uop + 2.9 L UF = 5.4 out and was 3.0 L in = net neg 2.4 L Wts down 2kg Bun 58, creat 1.1 CVP down from 20 to 5-10 this am Levo at 3 micrograms/min, IV dobutamine , IV lasix  30mg / hr  Presentation summary: 68 y.o. year-old w/ PMH for nephrolithiasis status post ureteral stent and lithotripsy at [facility] on 12/14/2023, post stent removal. Pt developed severe MR and mitral valve Candida endocarditis at an OSH. Pt left AMA to get closer to home and was admitted at [facility] on 12/29/2023. She underwent mitral valve repair surgery on 01/10/2024.  Patient had redo mitral valve replacement then on 02/08/2024. Since then pt's renal function has worsened somewhat and volume overload is worsening despite high doses of IV lasix  and metolazone . Pt remains vent-dependent. We are asked to see for CRRT.   Vitals:   02/17/24 0730 02/17/24 0738 02/17/24 0745 02/17/24 0800  BP: 102/72  104/64 (!) 101/56  Pulse: (!) 116  (!) 110   Resp: (!) 27  (!) 23 (!) 24  Temp: 100.2 F (37.9 C)  100.2 F (37.9 C) 100.2 F (37.9 C)  TempSrc:    Bladder  SpO2: 93% 95%    Weight:      Height:        Exam: Gen on vent, sedated Sclera anicteric, throat w/ ETT No jvd or bruits Chest clear anterior/ lateral RRR no MRG Abd soft ntnd no mass or ascites +bs GU foley cath Ext difuse 2-3+ tight LE and UE edema Neuro is on vent, sedated   Date                             Creat               eGFR (ml/min) April 2025                    0.63 10/24- 01/11/24 0.40- 0.90 11/08- 11/11                1.41 >> 1.02 11/12- 11/14                0.98- 1.00 11/14- 11/17                1.09- 1.39 11/18- 11/30                1.47- 1.86 12/01- 12/05                2.46 >> 1.10 12/06- 12/10                1.70- 2.82 12/11                           2.33                  12/12                           2.16 02/16/24                      2.02                 26 ml/min   UA 12/13 -  neg LE/ Hb, many bact, 0-5 epi, 11-20 rbc, 6-10 wbc Renal US  pending  Assessment/ Plan: AKI: b/l creatinine 0.4- 0.8. 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 Pt recently was having trouble w/ progressive volume overload and altered mental status BERNELL), unable to wean from the ventilator. Has not responded to maximum dose IV lasix  bolus or drips. Wt's were up 10-20 kg above nadir. Initial exam showed diffuse anasarca. CRRT started 12/13. BUN today is down to 50s, creat 1.1 (from 2.0). Cont CRRT Vol overload: by wts possibly 15- 20 kg up, has been in ICU for 7 weeks and may have lost considerable body wt. +diffuse LE > UE edema. UF goal is 100-200 cc/hr, can be adjusted up if needed.  Fungal endocarditis: per primary team S/p MV replacement w/ redo MVR on 12/05     Myer Fret MD  [facility] 02/17/2024, 8:34 AM  Recent Labs  Lab 02/16/24 0515 02/17/24 0423  HGB 12.2 12.4  ALBUMIN  2.3* 2.5*  2.5*  CALCIUM  8.3* 8.4*  PHOS 5.2* 2.7  CREATININE 2.02* 1.12*  K 4.0 4.2   No results for input(s): IRON, TIBC, FERRITIN in the last 168 hours. Inpatient medications:  feeding supplement (PROSource TF20)  60 mL Per Tube Daily   fiber supplement (BANATROL TF)  60 mL Per Tube BID   Gerhardt's butt cream   Topical BID   insulin  aspart  0-9 Units Subcutaneous Q4H   insulin  glargine  15 Units Subcutaneous Daily   metoCLOPramide  (REGLAN ) injection  5 mg Intravenous Q8H   mouth rinse  15 mL Mouth Rinse Q2H   pantoprazole  (PROTONIX ) IV  40 mg Intravenous QHS   phosphorus  250 mg Per Tube Q4H   polyethylene glycol  17 g Oral Daily   scopolamine   1 patch Transdermal Q72H   sodium chloride  flush  10-40 mL Intracatheter Q12H    ceFEPime  (MAXIPIME ) IV Stopped (02/16/24 1825)   dexmedetomidine  0.5 mcg/kg/hr (02/17/24 0800)   DOBUTamine  5 mcg/kg/min (02/17/24 0800)   feeding supplement (VITAL 1.5 CAL) 50 mL/hr at  02/17/24 0800   fentaNYL  infusion INTRAVENOUS Stopped (02/17/24 0733)   fluconazole  (DIFLUCAN ) IV Stopped (02/16/24 1157)   norepinephrine  (LEVOPHED ) Adult infusion 3 mcg/min (02/17/24 0800)   prismasol  BGK 4/2.5 1,500 mL/hr at 02/17/24 0722   prismasol  BGK 4/2.5 400 mL/hr at 02/17/24 9385   prismasol  BGK 4/2.5 400 mL/hr at 02/17/24 9375   promethazine  (PHENERGAN ) injection (IM or IVPB)     albuterol , fentaNYL , heparin , mouth rinse, polyethylene glycol, promethazine  (PHENERGAN ) injection (IM or IVPB), sodium chloride  flush

## 2024-02-17 NOTE — Progress Notes (Signed)
 Patient ID: Tracy Baker, female   DOB: 1955/10/28, 68 y.o.   MRN: [id number]     Advanced Heart Failure Rounding Note  Cardiologist: Lonni Cash, MD  Chief Complaint: S/p MV Repair / MV endocarditis  Patient Profile  Tracy Baker is a 68 y.o. female with hx of recent nephrolithiasis s/p ureteral stent and lithotripsy at [facility] 10/10 with stent removal. 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 Post-op course c/b post cardiotomy shock, junctional bradycardia, afib w/ RVR and HAP.  Subjective:    RHC 12/2 (on 0.125 of Milrinone ): RA 4, PA 42/13 (22), PCW 12 with Vwaves to 20, CO/CI TD 3.37/2.03, PAPi 7.5  12/5 S/P Redo MVR - Intra Op given  4UPRBCs, 2UPLTs, 2FFP, 1Cyro 12/6 Protek Duo cannulation 12/8 Decannulated.  Post-op echo showed EF 50%, RV mildly dilated and mildly dysfunctional, s/p bioprosthetic mitral valve (stable). 12/9 Diuresed with IV lasix  120 mg x1.  12/10 lasix  gtt started.  12/13 CVVH begun  CVP 10, still on Lasix  gtt and on CVVH pulling net UF 250 cc/hr.    Currently on dobutamine  5 and NE 1.  Co-ox 56%.    She remains on cefepime  and fluconazole . Tm 100.2.   HR 90s-100s in AF this morning.   Objective:    Weight Range: 77.9 kg Body mass index is 29.48 kg/m.   Vital Signs:   Temp:  [98.6 F (37 C)-100.4 F (38 C)] 100.2 F (37.9 C) (12/14 0800) Pulse Rate:  [55-238] 110 (12/14 0745) Resp:  [10-29] 24 (12/14 0800) BP: (75-163)/(34-115) 101/56 (12/14 0800) SpO2:  [71 %-98 %] 95 % (12/14 0738) FiO2 (%):  [40 %] 40 % (12/14 0800) Weight:  [77.9 kg] 77.9 kg (12/14 0430) Last BM Date : 02/16/24  Weight change: Filed Weights   02/15/24 0500 02/16/24 0324 02/17/24 0430  Weight: 79.7 kg 80.5 kg 77.9 kg   Intake/Output:  Intake/Output Summary (Last 24 hours) at 02/17/2024 0900 Last data filed at 02/17/2024 0800 Gross per 24 hour  Intake 2925.33 ml  Output 5323.3 ml  Net -2397.97 ml   Physical Exam    General: Awake on vent.  Neck: JVP 10 cm, no thyromegaly or thyroid  nodule.  Lungs: Clear to auscultation bilaterally with normal respiratory effort. CV: Nondisplaced PMI.  Heart regular S1/S2, no S3/S4, no murmur.  1+ edema to knees.  Abdomen: Soft, nontender, no hepatosplenomegaly, no distention.  Skin: Intact without lesions or rashes.  Neurologic: Follows commands.  Extremities: No clubbing or cyanosis.  HEENT: Normal.   Telemetry   AF 90s-100s (personally reviewed)   Labs    CBC Recent Labs    02/16/24 0515 02/17/24 0423  WBC 8.6 12.1*  HGB 12.2 12.4  HCT 39.3 39.3  MCV 98.5 100.0  PLT 61* 64*   Basic Metabolic Panel Recent Labs    87/86/74 0515 02/17/24 0423  NA 144 141  K 4.0 4.2  CL 107 101  CO2 25 29  GLUCOSE 203* 185*  BUN 102* 58*  CREATININE 2.02* 1.12*  CALCIUM  8.3* 8.4*  MG 2.1 2.2  PHOS 5.2* 2.7   Liver Function Tests Recent Labs    02/16/24 0515 02/17/24 0423  AST  --  27  ALT  --  12  ALKPHOS  --  209*  BILITOT  --  1.3*  PROT  --  5.9*  ALBUMIN  2.3* 2.5*  2.5*    Medications:    Scheduled Medications:  feeding supplement (PROSource TF20)  60 mL Per  Tube Daily   fiber supplement (BANATROL TF)  60 mL Per Tube BID   Gerhardt's butt cream   Topical BID   insulin  aspart  0-9 Units Subcutaneous Q4H   insulin  glargine  15 Units Subcutaneous Daily   metoCLOPramide  (REGLAN ) injection  5 mg Intravenous Q8H   mouth rinse  15 mL Mouth Rinse Q2H   pantoprazole  (PROTONIX ) IV  40 mg Intravenous QHS   phosphorus  250 mg Per Tube Q4H   polyethylene glycol  17 g Oral Daily   scopolamine   1 patch Transdermal Q72H   sodium chloride  flush  10-40 mL Intracatheter Q12H    Infusions:  ceFEPime  (MAXIPIME ) IV Stopped (02/16/24 1825)   dexmedetomidine  0.5 mcg/kg/hr (02/17/24 0800)   DOBUTamine  5 mcg/kg/min (02/17/24 0800)   feeding supplement (VITAL 1.5 CAL) 50 mL/hr at 02/17/24 0800   fentaNYL  infusion INTRAVENOUS Stopped (02/17/24 0733)    fluconazole  (DIFLUCAN ) IV Stopped (02/16/24 1157)   norepinephrine  (LEVOPHED ) Adult infusion 3 mcg/min (02/17/24 0800)   prismasol  BGK 4/2.5 1,500 mL/hr at 02/17/24 9277   prismasol  BGK 4/2.5 400 mL/hr at 02/17/24 9385   prismasol  BGK 4/2.5 400 mL/hr at 02/17/24 9375   promethazine  (PHENERGAN ) injection (IM or IVPB)      PRN Medications: albuterol , fentaNYL , heparin , mouth rinse, polyethylene glycol, promethazine  (PHENERGAN ) injection (IM or IVPB), sodium chloride  flush  Assessment/Plan   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 Kerrin for Candida endocarditis. Pre-op L/RHC 01/04/24 with no CAD and normal L/R heart pressures.  - Failed initial Milrinone  wean. Eventually weaned off, unfortunately restarted 12/1 for low output.  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 - Respiratory culture- Enterobacter, On cefepime , vancomycin , and fluconazole . Vancomycin  completed.  Low grade fever noted today.  - She is now on CVVH, I/Os net negative 2390 yesterday.  CVP down to 10 but significant peripheral edema.  Continue CVVH UF net 150-200 cc/hr today. Stop Lasix  while on CVVH.  - I suspect that this is primarily RV failure. Continue RV support with dobutamine  5.  NE 1 added while on CVVH.    Acute hypoxic respiratory failure: Suspect component of infection/aspiration PNA given unilateral disease.  - Respiratory culture- Enterobacter, On cefepime  and fluconazole .  - Needs fluid off before extubation.   Coagulopathy/thrombocytopenia/ABLA anemia:  - Thrombocytopenia present, slowly trending up with  platelets 61 => 64 today.  Suspect this is inflammatory due to critical illness.  - INR now back to normal, PTT not significantly elevated, no DIC - Start heparin  as below.   Sick sinus - Off amiodarone  - Initially with asystole underneath (as of 12/10) - Now stable rhythm, NSR alternating with accelerated junctional and atrial fibrillation.   Afib - Post operative - PLTs 59 => 61 => 64 - She is in AF this morning.   - Start heparin  gtt today, start with fixed dose to make sure no bleeding.   AKI on CKD 3b - Now CVVH with poor diuresis and rising BUN.  See above.   Protein-calorie malnutrition - On TF  Epistaxis  - s/p bilateral nasal packing per ENT 12/1     Pressure Ulcer R Buttock, DTI -  Q2 turns, offload  Left radial artery occluded - Left ulnar artery is patent.  - Starting heparin  gtt   CRITICAL CARE Performed by: Ezra Shuck  Total critical care time: 40 minutes  Critical care time was exclusive of separately billable procedures and treating other patients.  Critical care was necessary to treat or prevent imminent or life-threatening deterioration.  Critical care was time spent personally by me on the following activities: development of treatment plan with patient and/or surrogate as well as nursing, discussions with consultants, evaluation of patient's response to treatment, examination of patient, obtaining history from patient or surrogate, ordering and performing treatments and interventions, ordering and review of laboratory studies, ordering and review of radiographic studies, pulse oximetry and re-evaluation of patient's condition.  Ezra Shuck 02/17/2024 9:00 AM

## 2024-02-18 ENCOUNTER — Inpatient Hospital Stay (HOSPITAL_COMMUNITY)

## 2024-02-18 DIAGNOSIS — J9601 Acute respiratory failure with hypoxia: Secondary | ICD-10-CM | POA: Diagnosis not present

## 2024-02-18 DIAGNOSIS — R609 Edema, unspecified: Secondary | ICD-10-CM

## 2024-02-18 DIAGNOSIS — N179 Acute kidney failure, unspecified: Secondary | ICD-10-CM | POA: Diagnosis not present

## 2024-02-18 DIAGNOSIS — N183 Chronic kidney disease, stage 3 unspecified: Secondary | ICD-10-CM | POA: Diagnosis not present

## 2024-02-18 DIAGNOSIS — L89310 Pressure ulcer of right buttock, unstageable: Secondary | ICD-10-CM

## 2024-02-18 DIAGNOSIS — B377 Candidal sepsis: Secondary | ICD-10-CM | POA: Diagnosis not present

## 2024-02-18 LAB — CBC
HCT: 36.6 % (ref 36.0–46.0)
HCT: 37.3 % (ref 36.0–46.0)
Hemoglobin: 11.6 g/dL — ABNORMAL LOW (ref 12.0–15.0)
Hemoglobin: 11.8 g/dL — ABNORMAL LOW (ref 12.0–15.0)
MCH: 31.5 pg (ref 26.0–34.0)
MCH: 31.7 pg (ref 26.0–34.0)
MCHC: 31.7 g/dL (ref 30.0–36.0)
MCHC: 31.6 g/dL (ref 30.0–36.0)
MCV: 99.5 fL (ref 80.0–100.0)
MCV: 100.3 fL — ABNORMAL HIGH (ref 80.0–100.0)
Platelets: 53 K/uL — ABNORMAL LOW (ref 150–400)
Platelets: 59 K/uL — ABNORMAL LOW (ref 150–400)
RBC: 3.68 MIL/uL — ABNORMAL LOW (ref 3.87–5.11)
RBC: 3.72 MIL/uL — ABNORMAL LOW (ref 3.87–5.11)
RDW: 20.2 % — ABNORMAL HIGH (ref 11.5–15.5)
RDW: 20.1 % — ABNORMAL HIGH (ref 11.5–15.5)
WBC: 10.5 K/uL (ref 4.0–10.5)
WBC: 13.3 K/uL — ABNORMAL HIGH (ref 4.0–10.5)
nRBC: 0 % (ref 0.0–0.2)
nRBC: 0 % (ref 0.0–0.2)

## 2024-02-18 LAB — CULTURE, RESPIRATORY W GRAM STAIN

## 2024-02-18 LAB — RENAL FUNCTION PANEL
Albumin: 2.6 g/dL — ABNORMAL LOW (ref 3.5–5.0)
Albumin: 2.6 g/dL — ABNORMAL LOW (ref 3.5–5.0)
Anion gap: 8 (ref 5–15)
Anion gap: 9 (ref 5–15)
BUN: 30 mg/dL — ABNORMAL HIGH (ref 8–23)
BUN: 30 mg/dL — ABNORMAL HIGH (ref 8–23)
CO2: 28 mmol/L (ref 22–32)
CO2: 28 mmol/L (ref 22–32)
Calcium: 8.2 mg/dL — ABNORMAL LOW (ref 8.9–10.3)
Calcium: 8.4 mg/dL — ABNORMAL LOW (ref 8.9–10.3)
Chloride: 102 mmol/L (ref 98–111)
Chloride: 100 mmol/L (ref 98–111)
Creatinine, Ser: 0.82 mg/dL (ref 0.44–1.00)
Creatinine, Ser: 0.81 mg/dL (ref 0.44–1.00)
GFR, Estimated: >60 mL/min (ref >60)
GFR, Estimated: >60 mL/min (ref >60)
Glucose, Bld: 149 mg/dL — ABNORMAL HIGH (ref 70–99)
Glucose, Bld: 103 mg/dL — ABNORMAL HIGH (ref 70–99)
Phosphorus: 2.4 mg/dL — ABNORMAL LOW (ref 2.5–4.6)
Phosphorus: 1.9 mg/dL — ABNORMAL LOW (ref 2.5–4.6)
Potassium: 4.3 mmol/L (ref 3.5–5.1)
Potassium: 4.6 mmol/L (ref 3.5–5.1)
Sodium: 138 mmol/L (ref 135–145)
Sodium: 137 mmol/L (ref 135–145)

## 2024-02-18 LAB — COOXEMETRY PANEL
Carboxyhemoglobin: 1.6 % — ABNORMAL HIGH (ref 0.5–1.5)
Methemoglobin: <0.7 % (ref 0.0–1.5)
O2 Saturation: 56.2 %
Total hemoglobin: 12.1 g/dL (ref 12.0–16.0)

## 2024-02-18 LAB — GLUCOSE, CAPILLARY
Glucose-Capillary: 101 mg/dL — ABNORMAL HIGH (ref 70–99)
Glucose-Capillary: 125 mg/dL — ABNORMAL HIGH (ref 70–99)
Glucose-Capillary: 146 mg/dL — ABNORMAL HIGH (ref 70–99)
Glucose-Capillary: 153 mg/dL — ABNORMAL HIGH (ref 70–99)
Glucose-Capillary: 163 mg/dL — ABNORMAL HIGH (ref 70–99)
Glucose-Capillary: 185 mg/dL — ABNORMAL HIGH (ref 70–99)

## 2024-02-18 LAB — PROCALCITONIN: Procalcitonin: 1.24 ng/mL

## 2024-02-18 LAB — HEPARIN LEVEL (UNFRACTIONATED): Heparin Unfractionated: 0.11 [IU]/mL — ABNORMAL LOW (ref 0.30–0.70)

## 2024-02-18 LAB — MAGNESIUM: Magnesium: 2.4 mg/dL (ref 1.7–2.4)

## 2024-02-18 MED ORDER — SODIUM CHLORIDE 0.9 % IV SOLN
2.0000 g | Freq: Two times a day (BID) | INTRAVENOUS | Status: DC
  Administered 2024-02-18 (×2): 2 g via INTRAVENOUS
  Filled 2024-02-18 (×2): qty 12.5

## 2024-02-18 MED ORDER — ORAL CARE MOUTH RINSE
15.0000 mL | OROMUCOSAL | Status: DC
  Administered 2024-02-18 – 2024-03-09 (×58): 15 mL via OROMUCOSAL

## 2024-02-18 MED ORDER — SODIUM CHLORIDE 0.9 % IV SOLN: 2.0000 g | Freq: Two times a day (BID) | INTRAVENOUS | Status: DC

## 2024-02-18 MED ORDER — SODIUM PHOSPHATES 45 MMOLE/15ML IV SOLN
30.0000 mmol | Freq: Once | INTRAVENOUS | Status: AC
  Administered 2024-02-18: 20:00:00 30 mmol via INTRAVENOUS
  Filled 2024-02-18: qty 10

## 2024-02-18 MED ORDER — ORAL CARE MOUTH RINSE: 15.0000 mL | OROMUCOSAL | Status: DC | PRN

## 2024-02-18 MED ORDER — FENTANYL CITRATE (PF) 50 MCG/ML IJ SOSY
12.5000 ug | PREFILLED_SYRINGE | INTRAMUSCULAR | Status: DC | PRN
  Administered 2024-02-18 – 2024-02-19 (×2): 12.5 ug via INTRAVENOUS
  Filled 2024-02-18 (×2): qty 1

## 2024-02-18 MED ORDER — QUETIAPINE FUMARATE 25 MG PO TABS
25.0000 mg | ORAL_TABLET | Freq: Two times a day (BID) | ORAL | Status: DC
  Administered 2024-02-18 (×2): 25 mg
  Filled 2024-02-18: qty 1

## 2024-02-18 MED ORDER — CLONAZEPAM 0.5 MG PO TABS
0.5000 mg | ORAL_TABLET | Freq: Once | ORAL | Status: AC | PRN
  Administered 2024-02-18: 14:00:00 0.5 mg via ORAL
  Filled 2024-02-18: qty 1

## 2024-02-18 MED ORDER — MIRTAZAPINE 15 MG PO TABS
15.0000 mg | ORAL_TABLET | Freq: Every day | ORAL | Status: DC
  Administered 2024-02-18: 22:00:00 15 mg via ORAL
  Filled 2024-02-18: qty 1

## 2024-02-18 NOTE — Progress Notes (Addendum)
 Patient ID: Tracy Baker, female   DOB: Feb 18, 1956, 68 y.o.   MRN: [id number]     Advanced Heart Failure Rounding Note  Cardiologist: Lonni Cash, MD  Chief Complaint: S/p MV Repair / MV endocarditis  Patient Profile  Tracy Baker is a 68 y.o. female with hx of recent nephrolithiasis s/p ureteral stent and lithotripsy at [facility] 10/10 with stent removal. 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 Post-op course c/b post cardiotomy shock, junctional bradycardia, afib w/ RVR and HAP.  Subjective:    RHC 12/2 (on 0.125 of Milrinone ): RA 4, PA 42/13 (22), PCW 12 with Vwaves to 20, CO/CI TD 3.37/2.03, PAPi 7.5  12/5 S/P Redo MVR - Intra Op given  4UPRBCs, 2UPLTs, 2FFP, 1Cyro 12/6 Protek Duo cannulation 12/8 Decannulated.  Post-op echo showed EF 50%, RV mildly dilated and mildly dysfunctional, s/p bioprosthetic mitral valve (stable). 12/9 Diuresed with IV lasix  120 mg x1.  12/10 lasix  gtt started.  12/13 CVVH begun  CVP 12, on CVVH pulling net UF 150 cc/hr.    Currently on dobutamine  5 and NE 5.  Co-ox 56%.    She remains on cefepime  and fluconazole . Tm 100.8.   Intubated, follows commands.   Objective:    Weight Range: 74.5 kg Body mass index is 28.19 kg/m.   Vital Signs:   Temp:  [97.5 F (36.4 C)-100.8 F (38.2 C)] 100 F (37.8 C) (12/15 0715) Pulse Rate:  [39-122] 119 (12/15 0739) Resp:  [11-36] 36 (12/15 0739) BP: (66-133)/(43-98) 102/73 (12/15 0739) SpO2:  [85 %-100 %] 95 % (12/15 0715) FiO2 (%):  [40 %] 40 % (12/15 0739) Weight:  [74.5 kg] 74.5 kg (12/15 0500) Last BM Date : 02/17/24  Weight change: Filed Weights   02/16/24 0324 02/17/24 0430 02/18/24 0500  Weight: 80.5 kg 77.9 kg 74.5 kg   Intake/Output:  Intake/Output Summary (Last 24 hours) at 02/18/2024 0739 Last data filed at 02/18/2024 0700 Gross per 24 hour  Intake 2829.97 ml  Output 5616.9 ml  Net -2786.93 ml   Physical Exam   General:  intubated, follows  commands  Neck: JVD UTA.  Cor: Regular rate & rhythm.  Lungs: coarse throughout Extremities: +1 BLE edema, +UNNA boots Neuro: Follows commands, tries to mouth words around ETT  Telemetry   ST 100-110s, intt junctional / afib (personally reviewed)   Labs    CBC Recent Labs    02/17/24 0423 02/18/24 0422  WBC 12.1* 10.5  HGB 12.4 11.6*  HCT 39.3 36.6  MCV 100.0 99.5  PLT 64* 53*   Basic Metabolic Panel Recent Labs    87/85/74 0423 02/17/24 1617 02/17/24 1814 02/18/24 0422  NA 141   < > 140 138  K 4.2   < > 4.7 4.3  CL 101   < > 105 102  CO2 29   < > 22 28  GLUCOSE 185*   < > 181* 149*  BUN 58*   < > 42* 30*  CREATININE 1.12*   < > 1.00 0.82  CALCIUM  8.4*   < > 8.4* 8.2*  MG 2.2  --   --  2.4  PHOS 2.7   < > 2.7 2.4*   < > = values in this interval not displayed.   Liver Function Tests Recent Labs    02/17/24 0423 02/17/24 1617 02/17/24 1814 02/18/24 0422  AST 27  --   --   --   ALT 12  --   --   --  ALKPHOS 209*  --   --   --   BILITOT 1.3*  --   --   --   PROT 5.9*  --   --   --   ALBUMIN  2.5*  2.5*   < > 2.6* 2.6*   < > = values in this interval not displayed.    Medications:    Scheduled Medications:  feeding supplement (PROSource TF20)  60 mL Per Tube Daily   fiber supplement (BANATROL TF)  60 mL Per Tube BID   Gerhardt's butt cream   Topical BID   insulin  aspart  0-9 Units Subcutaneous Q4H   insulin  aspart  2 Units Subcutaneous Q4H   insulin  glargine  15 Units Subcutaneous Daily   metoCLOPramide  (REGLAN ) injection  5 mg Intravenous Q8H   mouth rinse  15 mL Mouth Rinse Q2H   pantoprazole  (PROTONIX ) IV  40 mg Intravenous QHS   polyethylene glycol  17 g Per Tube Daily   QUEtiapine   12.5 mg Per Tube BID   scopolamine   1 patch Transdermal Q72H   sodium chloride  flush  10-40 mL Intracatheter Q12H    Infusions:  ceFEPime  (MAXIPIME ) IV Stopped (02/17/24 1752)   dexmedetomidine  0.6 mcg/kg/hr (02/18/24 0700)   DOBUTamine  5 mcg/kg/min  (02/18/24 0700)   feeding supplement (VITAL 1.5 CAL) 50 mL/hr at 02/18/24 0700   fentaNYL  infusion INTRAVENOUS Stopped (02/18/24 9388)   fluconazole  (DIFLUCAN ) IV Stopped (02/17/24 1132)   heparin  500 Units/hr (02/18/24 0700)   norepinephrine  (LEVOPHED ) Adult infusion 5 mcg/min (02/18/24 0700)   prismasol  BGK 4/2.5 1,500 mL/hr at 02/18/24 9285   prismasol  BGK 4/2.5 400 mL/hr at 02/17/24 1914   prismasol  BGK 4/2.5 400 mL/hr at 02/17/24 1915   promethazine  (PHENERGAN ) injection (IM or IVPB)      PRN Medications: albuterol , fentaNYL , heparin , mouth rinse, polyethylene glycol, promethazine  (PHENERGAN ) injection (IM or IVPB), sodium chloride  flush  Assessment/Plan   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 Kerrin for Candida endocarditis. Pre-op L/RHC 01/04/24 with no CAD and normal L/R heart pressures.  - Failed initial Milrinone  wean. Eventually weaned off, unfortunately restarted 12/1 for low output.  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 - Respiratory culture- Enterobacter, On cefepime , vancomycin , and fluconazole . Vancomycin  completed.  Low grade fever noted today.  - She is now on CVVH, I/Os net negative 2786 yesterday.  CVP 12.  Continue CVVH UF net 150 cc/hr today.  - Suspect that this is primarily RV failure. Continue RV support with dobutamine  5.  NE added while on CVVH.    Acute hypoxic respiratory failure: Suspect component of infection/aspiration PNA given unilateral disease.  - Respiratory culture- Enterobacter, On cefepime  and fluconazole .  - Continue to diurese. May be stable  for extubation today but does need further diuresis.   Coagulopathy/thrombocytopenia/ABLA anemia:  - Thrombocytopenia present, platelets 61 => 64 > 53 today.  Suspect this is inflammatory due to critical illness.  - INR now back to normal, PTT not significantly elevated, no DIC - Start heparin  as below.   Sick sinus - Off amiodarone  - Initially with asystole underneath (as of 12/10) - Now stable rhythm, NSR alternating with accelerated  junctional and atrial fibrillation.   Afib - Post operative - PLTs 59 => 61 => 64 => 53 - Continue heparin  gtt - ST this morning, but in and out of junctional vs a fib  AKI on CKD 3b - Now CVVH with poor diuresis and rising BUN.  See above.   Protein-calorie malnutrition - On TF  Epistaxis  - s/p bilateral nasal packing per ENT 12/1     Pressure Ulcer R Buttock, DTI - Q2 turns, offload  Left radial artery occluded - Left ulnar artery is patent.  - Continue heparin  gtt   CRITICAL CARE Performed by: Beckey LITTIE Coe   Total critical care time: 15 minutes  Critical care time was exclusive of separately billable procedures and treating other patients.  Critical care was necessary to treat or prevent imminent or life-threatening deterioration.  Critical care was time spent personally by me on the following activities: development of treatment plan with patient and/or surrogate as well as nursing, discussions with consultants, evaluation of patient's response to treatment, examination of patient, obtaining history from patient or surrogate, ordering and performing treatments and interventions, ordering and review of laboratory studies, ordering and review of radiographic studies, pulse oximetry and re-evaluation of patient's condition.   Beckey LITTIE Coe 02/18/2024 7:39 AM  Patient seen with NP, I formulated the plan and agree with the above note.   Patient was just extubated this morning.  She is currently on dobutamine  5 and NE 7.  Getting CVVH  with UF 50 cc/hr.  I/Os net negative 2787 with co-ox 56%.  CVP 9-10 on my read. She has fever to 100.8, on cefepime /fluconazole . Prior Enterobacter in trach aspirate.   Rhythm is sinus tachy post-extubation.   General: NAD Neck: JVP 10 cm, no thyromegaly or thyroid  nodule.  Lungs: Clear to auscultation bilaterally with normal respiratory effort. CV: Nondisplaced PMI.  Heart regular S1/S2, no S3/S4, no murmur.  2+ edema to knees, edema of forearms.   Abdomen: Soft, nontender, no hepatosplenomegaly, no distention.  Skin: Intact without lesions or rashes.  Neurologic: Alert and oriented x 3.  Psych: Normal affect. Extremities: No clubbing or cyanosis.  HEENT: Normal.   Will work on weaning NE now that she is extubated.  She is very puffy, CVP around 10.  I would favor running CVVH net negative UF 100 cc/hr today and stopping tomorrow, but sounds like plan is to stop later this evening.  Can reassess.   Tm 100.8, on cefepime /fluconazole .  Would check trach aspirate and blood cultures today.   She is on fixed dose low heparin  with AF and low plts.  Lower at 53K, will have to follow closely.  Thrombocytopenia predates heparin , discussed with pharmacy and think not due to HIT.  She appears to be in sinus tachy this morning but will get an ECG.   CRITICAL CARE Performed by: Ezra Shuck  Total critical care time: 40 minutes  Critical care time was exclusive of separately billable procedures and treating other patients.  Critical care was necessary to treat or prevent imminent or life-threatening deterioration.  Critical care was time spent personally by me on the following activities: development of treatment plan with patient and/or surrogate as well as nursing, discussions with consultants, evaluation of patient's response to treatment, examination of patient, obtaining history from patient or surrogate, ordering and performing treatments and interventions, ordering and review of laboratory  studies, ordering and review of radiographic studies, pulse oximetry and re-evaluation of patient's condition.   Denym Rahimi [employer]  02/18/2024 9:24 AM

## 2024-02-18 NOTE — Progress Notes (Signed)
 9 Days Post-Op Procedures (LRB): ECMO CANNULATION (N/A) Subjective: Alert, appears a little anxious, denies pain  Objective: Vital signs in last 24 hours: Temp:  [97.5 F (36.4 C)-100.8 F (38.2 C)] 100 F (37.8 C) (12/15 0715) Pulse Rate:  [39-122] 119 (12/15 0739) Cardiac Rhythm: Sinus bradycardia (12/15 0400) Resp:  [11-36] 36 (12/15 0739) BP: (66-133)/(43-98) 102/73 (12/15 0739) SpO2:  [85 %-100 %] 95 % (12/15 0715) FiO2 (%):  [40 %] 40 % (12/15 0739) Weight:  [74.5 kg] 74.5 kg (12/15 0500)  Hemodynamic parameters for last 24 hours: CVP:  [3 mmHg-32 mmHg] 15 mmHg  Intake/Output from previous day: 12/14 0701 - 12/15 0700 In: 2830 [I.V.:889.1; NG/GT:1640.8; IV Piggyback:300] Out: 5616.9 [Urine:298] Intake/Output this shift: No intake/output data recorded.  General appearance: alert, cooperative, and no distress Neurologic: intact Heart: tachy, regular Lungs: clear to auscultation bilaterally Abdomen: normal findings: soft, non-tender Wound: clean and intact  Lab Results: Recent Labs    02/17/24 0423 02/18/24 0422  WBC 12.1* 10.5  HGB 12.4 11.6*  HCT 39.3 36.6  PLT 64* 53*   BMET:  Recent Labs    02/17/24 1814 02/18/24 0422  NA 140 138  K 4.7 4.3  CL 105 102  CO2 22 28  GLUCOSE 181* 149*  BUN 42* 30*  CREATININE 1.00 0.82  CALCIUM  8.4* 8.2*    PT/INR: No results for input(s): LABPROT, INR in the last 72 hours. ABG    Component Value Date/Time   PHART 7.340 (L) 02/13/2024 1618   HCO3 18.0 (L) 02/13/2024 1618   TCO2 19 (L) 02/13/2024 1618   ACIDBASEDEF 7.0 (H) 02/13/2024 1618   O2SAT 56.2 02/18/2024 0422   CBG (last 3)  Recent Labs    02/17/24 2358 02/18/24 0421 02/18/24 0721  GLUCAP 161* 153* 185*    Assessment/Plan: S/P Procedures (LRB): ECMO CANNULATION (N/A) Remains critically ill NEURO_ much more alert, still weak but improved CV- tachy this Am  Co-ox 56 on dobutamine  5 and norepi 5  CVP 8  On heparin  drip for a fib,  prosthetic mitral valve RESP_ VDRF  Hopefully can try to extubate today now that mental status improved ID- enterobacter pneumonia- day 6 cefepime   Febrile at 100  Diflucan  for candida RENAL- on CRRT, Uremia resolved  Lytes Ok ENDO- CBG mildly elevated GI- tolerating TF Deconditioning- PT/OT   LOS: 47 days    Tracy Baker 02/18/2024

## 2024-02-18 NOTE — Progress Notes (Addendum)
 Occupational Therapy Treatment Patient Details Name: Tracy Baker MRN: [id number] DOB: 06/16/1955 Today's Date: 02/18/2024   History of present illness 68 y.o. female adm 12/29/2023 after leaving AMA from [facility] (where she was on vacation due to lack of formal transfer) where she was being treated for C. Albicans candidemia. 10/28 TEE with mitral valve vegetation. 11/3 teeth Extractions. 11/6 Mitral valve repair. 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 Extubated 12/15. PMHx: thyroid  disease.   OT comments  Pt making slow progress toward goals, seen s/p extubation, needs total A +2 and dependent for ADLs at this time, currently able to sit EOB x5 min for feeding trial, however then with orthostatic BP readings, returned to supine (see PT note for BP measures). Reiterated sternal prec, but will benefit from reinforcement. Pt presenting with impairments listed below, will follow acutely. Patient will benefit from intensive inpatient follow-up therapy, >3 hours/day.       If plan is discharge home, recommend the following:  Two people to help with walking and/or transfers;Two people to help with bathing/dressing/bathroom;Assistance with cooking/housework;Assistance with feeding;Assist for transportation   Equipment Recommendations  Tub/shower seat    Recommendations for Other Services Rehab consult    Precautions / Restrictions Precautions Precautions: Fall;Sternal Precaution Booklet Issued: No Recall of Precautions/Restrictions: Impaired Precaution/Restrictions Comments: A-line; temp pacer; NG tube, foley, CCRT Restrictions Weight Bearing Restrictions Per Provider Order: No       Mobility Bed Mobility Overal bed mobility: Needs Assistance Bed Mobility: Rolling, Supine to Sit, Sit to Supine Rolling: Total assist, +2 for physical assistance, +2 for safety/equipment   Supine to sit: Total assist, +2 for physical  assistance, +2 for safety/equipment, HOB elevated Sit to supine: Total assist, +2 for physical assistance        Transfers                   General transfer comment: deferred due to hypotension 82/48 (59) when sitting EOB     Balance Overall balance assessment: Needs assistance Sitting-balance support: No upper extremity supported, Feet unsupported Sitting balance-Leahy Scale: Poor Sitting balance - Comments: initially maxA, moments of modA but constant posterior LOB with reduction in support Postural control: Posterior lean                                 ADL either performed or assessed with clinical judgement   ADL                                         General ADL Comments: dependent at this time    Extremity/Trunk Assessment Upper Extremity Assessment Upper Extremity Assessment: Generalized weakness;Difficult to assess due to impaired cognition (edematous BUE)   Lower Extremity Assessment Lower Extremity Assessment: Defer to PT evaluation        Vision   Additional Comments: not formally assessed   Perception Perception Perception: Not tested   Praxis Praxis Praxis: Not tested   Communication Communication Communication: Impaired Factors Affecting Communication: Reduced clarity of speech;Difficulty expressing self   Cognition Arousal: Alert Behavior During Therapy: Flat affect               OT - Cognition Comments: minimal verbalizations, intermittently following commands  Following commands: Impaired Following commands impaired: Only follows one step commands consistently      Cueing   Cueing Techniques: Verbal cues, Tactile cues  Exercises      Shoulder Instructions       General Comments see PT note for BP measures    Pertinent Vitals/ Pain       Pain Assessment Pain Assessment: Faces Pain Score: 2  Faces Pain Scale: Hurts a little bit Pain Location: generalized Pain  Descriptors / Indicators: Discomfort Pain Intervention(s): Limited activity within patient's tolerance, Monitored during session, Repositioned  Home Living                                          Prior Functioning/Environment              Frequency  Min 2X/week        Progress Toward Goals  OT Goals(current goals can now be found in the care plan section)  Progress towards OT goals: Progressing toward goals     Plan      Co-evaluation    PT/OT/SLP Co-Evaluation/Treatment: Yes Reason for Co-Treatment: To address functional/ADL transfers;For patient/therapist safety;Complexity of the patient's impairments (multi-system involvement);Necessary to address cognition/behavior during functional activity PT goals addressed during session: Mobility/safety with mobility;Strengthening/ROM;Balance OT goals addressed during session: ADL's and self-care;Strengthening/ROM SLP goals addressed during session: Swallowing    AM-PAC OT 6 Clicks Daily Activity     Outcome Measure   Help from another person eating meals?: Total Help from another person taking care of personal grooming?: Total Help from another person toileting, which includes using toliet, bedpan, or urinal?: Total Help from another person bathing (including washing, rinsing, drying)?: Total Help from another person to put on and taking off regular upper body clothing?: Total Help from another person to put on and taking off regular lower body clothing?: Total 6 Click Score: 6    End of Session Equipment Utilized During Treatment: Oxygen  OT Visit Diagnosis: Unsteadiness on feet (R26.81);Muscle weakness (generalized) (M62.81)   Activity Tolerance Patient limited by lethargy   Patient Left in bed;with call bell/phone within reach;with nursing/sitter in room   Nurse Communication Mobility status        Time: 8576-8556 OT Time Calculation (min): 20 min  Charges: OT General Charges $OT  Visit: 1 Visit OT Treatments $Therapeutic Activity: 8-22 mins  Hinda Lindor K, OTD, OTR/L SecureChat [facility] [phone]   [name] 02/18/2024, 4:49 PM

## 2024-02-18 NOTE — Progress Notes (Signed)
 [facility] Progress Note    Assessment/ Plan:   AKI -b/l Cr 0.4-0.8. 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 CRRT started 12/13 to help offload volume and given encephalopathy/uremia. Was unable to wean from ventilator. -c/w CRRT for today especially given the possibility of extubation to help avoid pulm edema -Avoid nephrotoxic medications including NSAIDs and iodinated intravenous contrast exposure unless the latter is absolutely indicated.  Preferred narcotic agents for pain control are hydromorphone , fentanyl , and methadone . Morphine should not be used. Avoid Baclofen and avoid oral sodium phosphate  and magnesium  citrate based laxatives / bowel preps. Continue strict Input and Output monitoring. 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 On cefepime , fluconazole ,  -on dobuatmine -UF as tolerated with CRRT  AHRF -possible extubation today  Anemia -transfuse PRN for Hgb <7 -avoid IV Fe  Discussed with ICU RN and CTS.  [name], MD [facility]  Subjective:   Patient seen and examined on CRRT. Discussed with ICU RN. CVP down to 9-10 from 20. Lasix  gtt stopped, minimal urine output. No issues with CRRT. Possible extubation today. Off NE, on dobutamine  UOP ~0.2L Net neg ~2.7L   Objective:   BP 110/70 (BP Location: Left Arm)   Pulse (!) 120   Temp (!) 100.4 F (38 C) (Bladder)   Resp (!) 21   Ht 5' 4 (1.626 m)   Wt 74.5 kg   SpO2 98%   BMI 28.19 kg/m   Intake/Output Summary (Last 24 hours) at 02/18/2024 9167 Last data filed at 02/18/2024 0800 Gross per 24 hour  Intake 2821.49 ml  Output 5478.6 ml  Net -2657.11 ml   Weight change: -3.4 kg  Physical Exam: Gen: ill appearing CVS: RRR Resp: decreased breath sounds bibasilar Abd: soft Ext: trace  dependent edema Neuro: awake Dialysis access: LIJ temp HD cath  Imaging: US  RENAL Result Date: 02/16/2024 EXAM: US  Retroperitoneum Complete, Renal. 02/16/2024 04:59:00 PM TECHNIQUE: Real-time ultrasonography of the retroperitoneum renal was performed. COMPARISON: None available CLINICAL HISTORY: AKI (acute kidney injury) FINDINGS: FINDINGS: Trace perihepatic ascites. Small right pleural effusion. RIGHT KIDNEY/URETER: Right kidney measures 11.5 x 5.9 x 5.7 cm with a volume of 204 cm. Normal cortical echogenicity. No hydronephrosis. No calculus. No mass. 8 x 9 x 9 mm simple right upper pole renal cyst, benign. LEFT KIDNEY/URETER: Left kidney measures 13.0 x 5.4 x 6.3 cm with a volume of 231 cm. Normal cortical echogenicity. No hydronephrosis. No calculus. No mass. BLADDER: Bladder decompressed by an indwelling foley catheter. IMPRESSION: 1. No acute findings. 2. 9 mm simple right renal cyst, benign. 3. Bladder decompressed by indwelling Foley catheter. Electronically signed by: Pinkie Pebbles MD 02/16/2024 05:34 PM EST RP Workstation: [id number]   DG CHEST PORT 1 VIEW Result Date: 02/16/2024 EXAM: 1 VIEW(S) XRAY OF THE CHEST 02/16/2024 05:10:48 PM COMPARISON: 02/15/2024 CLINICAL HISTORY: S/P hemodialysis catheter insertion FINDINGS: LINES, TUBES AND DEVICES: New left IJ catheter with tip over right atrium. New enteric tube with tip coursing into the mid stomach. Endotracheal tube with tip 4.7 cm above carina. Stable right upper extremity PICC with tip in mid SVC. LUNGS AND PLEURA: Stable patchy bilateral lower lung opacities, likely atelectasis. Stable trace bilateral pleural effusions. No pneumothorax. HEART AND MEDIASTINUM: Prosthetic cardiac valve noted. Epicardial pacing wires noted. BONES AND SOFT TISSUES: Right paramidline skin staples  noted. No acute osseous abnormality. IMPRESSION: 1. Left internal jugular catheter with tip over the right atrium, appropriate in position. 2. Additional support  apparatus as aobve. 3. Stable patchy bilateral lower lung opacities, likely atelectasis. 4. Stable trace bilateral pleural effusions. Electronically signed by: Pinkie Pebbles MD 02/16/2024 05:29 PM EST RP Workstation: [id number]    Labs: BMET Recent Labs  Lab 02/14/24 0339 02/14/24 1741 02/15/24 0524 02/15/24 1600 02/16/24 0515 02/17/24 0423 02/17/24 1617 02/17/24 1814 02/18/24 0422  NA 143   < > 142 143 144 141 130* 140 138  K 3.9   < > 3.3* 5.2* 4.0 4.2 4.5 4.7 4.3  CL 106   < > 106 109 107 101 97* 105 102  CO2 22   < > 24 23 25 29 26 22 28   GLUCOSE 149*   < > 174* 193* 203* 185* 429* 181* 149*  BUN 88*   < > 97* 101* 102* 58* 41* 42* 30*  CREATININE 2.33*   < > 2.25* 2.16* 2.02* 1.12* 1.02* 1.00 0.82  CALCIUM  8.5*   < > 8.5* 8.7* 8.3* 8.4* 7.7* 8.4* 8.2*  PHOS 5.1*  --  4.8*  --  5.2* 2.7 2.5 2.7 2.4*   < > = values in this interval not displayed.   CBC Recent Labs  Lab 02/15/24 0524 02/16/24 0515 02/17/24 0423 02/18/24 0422  WBC 9.3 8.6 12.1* 10.5  HGB 12.8 12.2 12.4 11.6*  HCT 39.6 39.3 39.3 36.6  MCV 95.0 98.5 100.0 99.5  PLT 59* 61* 64* 53*    Medications:     feeding supplement (PROSource TF20)  60 mL Per Tube Daily   fiber supplement (BANATROL TF)  60 mL Per Tube BID   Gerhardt's butt cream   Topical BID   insulin  aspart  0-9 Units Subcutaneous Q4H   insulin  aspart  2 Units Subcutaneous Q4H   insulin  glargine  15 Units Subcutaneous Daily   metoCLOPramide  (REGLAN ) injection  5 mg Intravenous Q8H   mouth rinse  15 mL Mouth Rinse Q2H   pantoprazole  (PROTONIX ) IV  40 mg Intravenous QHS   polyethylene glycol  17 g Per Tube Daily   QUEtiapine   12.5 mg Per Tube BID   scopolamine   1 patch Transdermal Q72H   sodium chloride  flush  10-40 mL Intracatheter Q12H      [name], MD [facility] 02/18/2024, 8:32 AM

## 2024-02-18 NOTE — Progress Notes (Signed)
 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 Kerrin. PCCM consulted for ICU post-op vent management.  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 Kerrin 12/6: Overnight patient started requiring more vasopressor support, early morning she was on dobutamine  5, milrinone  0.375, epinephrine  at 10, vasopressin  0.04 and norepinephrine  34 mics, she is not making urine.  Cannulated with Protek duo for RV support,  12/7 bronch, broadened antibiotics 12/8 decannulated from VV ECMO after sweep and flow wean trials. Lasix  stopped. Restarted TF. 12/10 chest tubes out, off pressors. Aline removed. 12/13 started CRRT 12/15 extubated, weak, continued fevers remains on CRRT  Interim History / Subjective:  Passed SBT, extubated though globally weak  On CRRT pulling -50cc/hr Remains on norepi and Dobutamine  5 ~300cc UOP yesterday [place] 56%  Objective    Blood pressure 106/63, pulse (!) 120, temperature 100 F (37.8 C), resp. rate (!) 22, height 5' 4 (1.626 m), weight 74.5 kg, SpO2 91%. CVP:  [3 mmHg-32 mmHg] 9 mmHg  Vent Mode: PSV;CPAP FiO2 (%):  [40 %] 40 % Set Rate:  [22 bmp] 22 bmp Vt Set:  [440 mL] 440 mL PEEP:  [5 cmH20] 5 cmH20 Pressure Support:  [5 cmH20] 5  cmH20 Plateau Pressure:  [15 cmH20] 15 cmH20   Intake/Output Summary (Last 24 hours) at 02/18/2024 0947 Last data filed at 02/18/2024 0900 Gross per 24 hour  Intake 2777.38 ml  Output 5353.8 ml  Net -2576.42 ml   Filed Weights   02/16/24 0324 02/17/24 0430 02/18/24 0500  Weight: 80.5 kg 77.9 kg 74.5 kg     General: Elderly ill-appearing female in no acute distress HEENT: MM pink/moist, sclera anicteric, ETT in place Neuro: Following commands on low-dose Precedex  CV: s1s2 junctional rhythm, no m/r/g PULM: Minimally diminished in the bilateral bases, weaning on PSV, minimal secretions GI: soft, nondistended Extremities: warm/dry, 2+ peripheral edema       I/O -2.7L ,  Repeat trach aspirate culture 12/13>rare enterobacter>pan-sensitive    Patient Lines/Drains/Airways Status     Active Line/Drains/Airways     Name Placement date Placement time Site Days   PICC Double Lumen 01/15/24 Right Basilic 35 cm 0 cm 01/15/24  8384  -- 34   Hemodialysis Catheter Left Internal jugular Triple lumen Temporary (Non-Tunneled) 02/16/24  1600  Internal jugular  2   Urethral Catheter [name], RN Latex;Straight-tip;Temperature probe 14 Fr. 02/08/24  0800  Latex;Straight-tip;Temperature probe  10   Small Bore Feeding Tube Left nare Marking at nare/corner of mouth 67 cm 02/15/24  1135  Left nare  3   Wound 01/16/24 0800 Traumatic Back Left;Upper 01/16/24  0800  Back  33   Wound 02/08/24 Surgical Closed Surgical Incision Sternum Mid 02/08/24  --  Sternum  10   Wound 02/12/24 1800 Pressure Injury Buttocks Right;Medial Deep Tissue  Pressure Injury - Purple or maroon localized area of discolored intact skin or blood-filled blister due to damage of underlying soft tissue from pressure and/or shear. 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 If still in the hospital at this time, reconsult ID (they wanted to see her before going off antifungals as OP) -Eventually needs to start coumadin once platelets are improving. Continue fixed dose heparin    Shock, mixed cardiogenic and distributive; continues to improve -con't cefepime ; follow up respiratory culture but gram stain is reassuring despite having very heavy ETT secretions recently -DBA -continues to fever, check LE dopplers  -wean norepi as able to maintain MAP >65  Acute respiratory failure with hypoxia RUL pneumonia- Enterobacter -passed SBT/SAT and extubated to [place]  - Globally weak, swallow screen pending - Encourage IS - Continue cefepime , acute heart failure wanted repeat respiratory cultures  AKI on CKD stage IIIa, Cr plateau, but BUN still rising -strict I/O -renally dose meds, avoid nephrotoxic meds - Minimal urine output today, continue CRRT, heart failure recommends 24 more hours pulling -100  Hypoglycemia improved; now mild hyperglycemia on TF H/o prediabetes, A1c 6 -SSI PRN - Continue 2 units q4h TF coverage and glargine 15 units daily -goal BG 140-180    Acute blood loss anemia status post multiple PRBC transfusion Thrombocytopenia due to critical illness status post 2 unit platelets Coagulopathy, probably from liver congestion requiring FFP transfusion -transfuse for Hb <7 or hemodynamically significant bleeding -with platelets  slowly recovering will continue fixed dose heparin  gtt  Severe protein calorie malnutrition -Swallow evaluation, meanwhile continue TF at goal via cortrak -anticipate she will need supplemental nutrition for a while even once she is eating given her long duration of critical illness  Hyperbilirubinemia; unclear etiology, stable  -periodically monitor  Deconditioning, prolonged course of illness leading up to this  -PT, OT -anticipate she will need CIR  Pressure ulcer on buttocks; skin breakdown from bandage on R neck -optimizing nutrition -turns -need to start mobilizing her as she can tolerate - Possible chlorhexidine  allergy, may have also been from adhesives - Wound care following  Left hand cool> resolved; has thrombus in L radial artery -fixed dose heparin  -con't watching perfusion in left hand;   Daughter Tracy Baker updated via phone today.   Labs:  CBC: Recent Labs  Lab 02/14/24 0339 02/15/24 0524 02/16/24 0515 02/17/24 0423 02/18/24 0422  WBC 10.3 9.3 8.6 12.1* 10.5  HGB 13.5 12.8 12.2 12.4 11.6*  HCT 42.4 39.6 39.3 39.3 36.6  MCV 95.9 95.0 98.5 100.0 99.5  PLT 40* 59* 61* 64* 53*    Basic Metabolic Panel: Recent Labs  Lab 02/14/24 0339 02/14/24 1741 02/15/24 0524 02/15/24 1600 02/16/24 0515 02/17/24 0423 02/17/24 1617 02/17/24 1814 02/18/24 0422  NA 143   < > 142   < >  144 141 130* 140 138  K 3.9   < > 3.3*   < > 4.0 4.2 4.5 4.7 4.3  CL 106   < > 106   < > 107 101 97* 105 102  CO2 22   < > 24   < > 25 29 26 22 28   GLUCOSE 149*   < > 174*   < > 203* 185* 429* 181* 149*  BUN 88*   < > 97*   < > 102* 58* 41* 42* 30*  CREATININE 2.33*   < > 2.25*   < > 2.02* 1.12* 1.02* 1.00 0.82  CALCIUM  8.5*   < > 8.5*   < > 8.3* 8.4* 7.7* 8.4* 8.2*  MG 2.2  --  2.1  --  2.1 2.2  --   --  2.4  PHOS 5.1*  --  4.8*  --  5.2* 2.7 2.5 2.7 2.4*   < > = values in this interval not displayed.   GFR: Estimated Creatinine Clearance: 64.9 mL/min (by C-G formula based on  SCr of 0.82 mg/dL). Recent Labs  Lab 02/12/24 0822 02/12/24 1346 02/15/24 0524 02/16/24 0515 02/17/24 0423 02/18/24 0422  WBC  --    < > 9.3 8.6 12.1* 10.5  LATICACIDVEN 1.2  --   --   --   --   --    < > = values in this interval not displayed.    Liver Function Tests: Recent Labs  Lab 02/12/24 0410 02/13/24 0339 02/14/24 0339 02/16/24 0515 02/17/24 0423 02/17/24 1617 02/17/24 1814 02/18/24 0422  AST 26 18  --   --  27  --   --   --   ALT <5 <5  --   --  12  --   --   --   ALKPHOS 99 105  --   --  209*  --   --   --   BILITOT 1.4* 1.4*  --   --  1.3*  --   --   --   PROT 4.8* 4.8*  --   --  5.9*  --   --   --   ALBUMIN  2.6* 2.4*   < > 2.3* 2.5*  2.5* 2.4* 2.6* 2.6*   < > = values in this interval not displayed.   ABG    Component Value Date/Time   PHART 7.340 (L) 02/13/2024 1618   PCO2ART 33.4 02/13/2024 1618   PO2ART 89 02/13/2024 1618   HCO3 18.0 (L) 02/13/2024 1618   TCO2 19 (L) 02/13/2024 1618   ACIDBASEDEF 7.0 (H) 02/13/2024 1618   O2SAT 56.2 02/18/2024 0422    Coagulation Profile: Recent Labs  Lab 02/12/24 0410 02/13/24 0339  INR 1.3* 1.2      CBG: Recent Labs  Lab 02/17/24 1751 02/17/24 1927 02/17/24 2358 02/18/24 0421 02/18/24 0721  GLUCAP 159* 162* 161* 153* 185*   This patient is critically ill with multiple organ system failure which requires frequent high CRITICAL CARE Performed by: Leita SAUNDERS Issac Moure   Total critical care time: 35 minutes  Critical care time was exclusive of separately billable procedures and treating other patients.  Critical care was necessary to treat or prevent imminent or life-threatening deterioration.  Critical care was time spent personally by me on the following activities: development of treatment plan with patient and/or surrogate as well as nursing, discussions with consultants, evaluation of patient's response to treatment, examination of patient, obtaining history from patient or surrogate, ordering  and  performing treatments and interventions, ordering and review of laboratory studies, ordering and review of radiographic studies, pulse oximetry and re-evaluation of patient's condition.   Leita SAUNDERS Jailine Lieder, PA-C [facility] See Amion for pager If no response to pager , please call [phone] until 7pm After 7:00 pm call Elink  [phone]

## 2024-02-18 NOTE — Progress Notes (Signed)
 PHARMACY - ANTICOAGULATION CONSULT NOTE  Pharmacy Consult for heparin  Indication: atrial fibrillation  Allergies[1]  Patient Measurements: Height: 5' 4 (162.6 cm) Weight: 74.5 kg (164 lb 3.9 oz) IBW/kg (Calculated) : 54.7 HEPARIN  DW (KG): 66.5  Vital Signs: Temp: 100 F (37.8 C) (12/15 0715) Temp Source: Axillary (12/15 0200) BP: 102/73 (12/15 0739) Pulse Rate: 119 (12/15 0739)  Labs: Recent Labs    02/16/24 0515 02/17/24 0423 02/17/24 1617 02/17/24 1814 02/18/24 0422  HGB 12.2 12.4  --   --  11.6*  HCT 39.3 39.3  --   --  36.6  PLT 61* 64*  --   --  53*  HEPARINUNFRC  --   --   --  0.11* 0.11*  CREATININE 2.02* 1.12* 1.02* 1.00 0.82    Estimated Creatinine Clearance: 64.9 mL/min (by C-G formula based on SCr of 0.82 mg/dL).   Medical History: Past Medical History:  Diagnosis Date   Thyroid  disease     Medications:  Scheduled:   feeding supplement (PROSource TF20)  60 mL Per Tube Daily   fiber supplement (BANATROL TF)  60 mL Per Tube BID   Gerhardt's butt cream   Topical BID   insulin  aspart  0-9 Units Subcutaneous Q4H   insulin  aspart  2 Units Subcutaneous Q4H   insulin  glargine  15 Units Subcutaneous Daily   metoCLOPramide  (REGLAN ) injection  5 mg Intravenous Q8H   mouth rinse  15 mL Mouth Rinse Q2H   pantoprazole  (PROTONIX ) IV  40 mg Intravenous QHS   polyethylene glycol  17 g Per Tube Daily   QUEtiapine   12.5 mg Per Tube BID   scopolamine   1 patch Transdermal Q72H   sodium chloride  flush  10-40 mL Intracatheter Q12H    Assessment: Tracy Baker is a 89 YOF now s/p MV repair and redo on 12/5. Hospitalization complicated by acute RV failure with cardiogenic shock s/p Protek duo VV ECMO cannula. Patient also with atrial fibrillation throughout this hospitalization. PLT have been low this week, slightly improving today (02/17/24). Discussed with team, plan to initiate low-dose heparin  infusion. Pharmacy consulted for heparin  dosing.   02/18/24: Heparin   level remains below goal 0.11 on 500 units/h.  Hgb 11.6 (down slightly), pltc 64 (pre-heparin ) > 53.  No s/sx bleeding per RN or infusion issues.  Per discussion with Dr. Rolan, will continue fixed heparin  rate and monitor pltc trend.  Goal of Therapy:  Heparin  level <0.10 units/ml Monitor platelets by anticoagulation protocol: Yes   Plan:  Heparin  500 units/hr - do not titrate Monitor heparin  level, CBC, and s/sx of bleeding daily F/u plans for heparin  titrations  Maurilio Fila, PharmD Clinical Pharmacist 02/18/2024  9:16 AM     [1]  Allergies Allergen Reactions   Codeine Anaphylaxis and Nausea And Vomiting   Bactoshield Chg [Chlorhexidine  Gluconate] Dermatitis   Chlorhexidine  Dermatitis and Rash   Red Dye #40 (Allura Red) Nausea Only

## 2024-02-18 NOTE — Progress Notes (Signed)
 PHARMACY NOTE:  ANTIMICROBIAL RENAL DOSAGE ADJUSTMENT  Current antimicrobial regimen includes a mismatch between antimicrobial dosage and estimated renal function.  As per policy approved by the Pharmacy & Therapeutics and Medical Executive Committees, the antimicrobial dosage will be adjusted accordingly.  Current antimicrobial dosage:  cefepime  2g q24h  Indication: PNA  Renal Function:  Estimated Creatinine Clearance: 64.9 mL/min (by C-G formula based on SCr of 0.82 mg/dL). []      On intermittent HD, scheduled: [x]      On CRRT    Antimicrobial dosage has been changed to:  cefepime  2g q12h  Additional comments:   Thank you for allowing pharmacy to be a part of this patient's care.  Ozell ONEIDA [place], [facility] 02/18/2024 11:00 AM

## 2024-02-18 NOTE — Progress Notes (Addendum)
 Patient extubated this morning at 845. Doing well off of ventilator. Able to express concerns, no acute distress. Family at bedside at 1330 to see patient. Daughter Mallory) informed me that patient told her that staff are inappropriately touching the patient. At this time patient irritable, throwing flutter valve across room. Leita, PA CCM and Lauraine CROME., CN notified of patient condition. 1349: Leita, PA CCM at bedside with Lauraine CROME., CN, daughter and son. 1400: Foley removed per order with SLP, PT, OT, and Daughter at bedside. 1417: 2 female RNs at bedside to place new foley per order.

## 2024-02-18 NOTE — Consult Note (Addendum)
 WOC Nurse wound follow up Wound type: Right neck rash/dermatitis. Measurement: 10 cm x 6 cm x 0.1 cm Wound bed: 100% red. Drainage (amount, consistency, odor) Minimum amount, no odor, serous. Periwound: intact, swelling, inserted ECMO site with necrotic tissue. Dressing procedure/placement/frequency: Cleanse wound with Vashe #[id number], not rinse and allow to dry. Apply Xeroform daily to wound bed and cover with silicone foam dressing. Note: the foam can stay up to 3 days if not saturated or soiled, ok to lift, change the Xeroform, and reapply.   Wound type: DTI to R buttock. Small purple area on the L side. Measurement: total 6 cm x 4 cm.  Wound bed: 100% purple area, redness. Partial thickness less than 0.5 cm x 0.5 cm.   Drainage (amount, consistency, odor) scant amount, no odor, serous. Periwound: intact, perineum with MASD dermatitis. Dressing procedure/placement/frequency: Cleanse wound with NS, pat dry. Apply Xeroform daily to wound bed and cover with silicone foam dressing. Note: the foam can stay up to 3 days if not saturated or soiled, ok to lift, change the Xeroform, and reapply. Apply in the perineum Gerhardt butt cream twice a day or PRN.    WOC team will not plan to follow further. Please reconsult if further assistance is needed. Thank-you,  Lela Holm MSN, RN, CWCN, CNS.  (Phone [phone])

## 2024-02-18 NOTE — Procedures (Signed)
 Extubation Procedure Note  Patient Details:   Name: Tracy Baker DOB: 1955-06-03 MRN: [id number]   Airway Documentation:    Vent end date: 02/18/24 Vent end time: 0845   Evaluation  O2 sats: stable throughout Complications: No apparent complications Patient did tolerate procedure well. Bilateral Breath Sounds: Coarse crackles   Yes  Patient extubated to 4L [place] per MD order.  Positive cuff leak noted.  No evidence of stridor.  Patient able to speak post extubation.  Sats and vitals are currently stable.  Will continue to monitor.    Leotis KATHEE Benders 02/18/2024, 8:49 AM

## 2024-02-18 NOTE — TOC Progression Note (Signed)
 Transition of Care [facility]) - Progression Note    Patient Details  Name: Tracy Baker MRN: [id number] Date of Birth: 06/19/1955  Transition of [facility] CM/SW Contact  Justina Delcia Czar, RN Phone Number: [phone] 02/18/2024, 1:16 PM  Clinical Narrative:     PT/OT recommended IP rehab vs SNF rehab. IP rehab was consulted and insurance denied IP rehab.  HF CSW will start SNF rehab referral when medically stable.  Will need updated PT/OT notes for SNF placement.    Chart reviewed for discharge readiness, patient not medically stable for d/c. Inpatient CM/CSW will continue to monitor pt's advancement through interdisciplinary progression rounds.    If new pt transition needs arise, MD please place a TOC consult.      Expected Discharge Plan: Skilled Nursing Facility Barriers to Discharge: Continued Medical Work up    Expected Discharge Plan and Services   Discharge Planning Services: CM Consult Post Acute Care Choice: Skilled Nursing Facility Living arrangements for the past 2 months: Apartment                    Social Drivers of Health (SDOH) Interventions SDOH Screenings   Food Insecurity: No Food Insecurity (12/29/2023)  Housing: Low Risk (12/29/2023)  Transportation Needs: No Transportation Needs (12/29/2023)  Utilities: Not At Risk (12/29/2023)  Alcohol Screen: Low Risk (06/08/2022)  Depression (PHQ2-9): Low Risk (11/08/2023)  Social Connections: Socially Isolated (12/29/2023)  Tobacco Use: High Risk (02/08/2024)    Readmission Risk Interventions     No data to display

## 2024-02-18 NOTE — Progress Notes (Signed)
 Nutrition Follow-up  DOCUMENTATION CODES:   Severe malnutrition in context of acute illness/injury  INTERVENTION:   Continue Tube Feeding via Cortrak:  Vital 1.5 with goal of 50 ml/hr Pro-Source TF20 60 mL to daily  TF at goal provides 101 g of protein, 1880 kcals, 916 mL of free water   Banatrol TF 60 mL BID via tube as stool bulking agent- each packet provides 5g soluble fiber  Monitor swallow eval results  NUTRITION DIAGNOSIS:   Severe Malnutrition related to acute illness as evidenced by moderate muscle depletion, mild muscle depletion, energy intake < or equal to 50% for > or equal to 5 days.  Being addressed via TF  GOAL:   Patient will meet greater than or equal to 90% of their needs  Progressing  MONITOR:   Vent status, TF tolerance, Labs, Weight trends  REASON FOR ASSESSMENT:   Consult Other (Comment) (new ECMO)  ASSESSMENT:   68 yo female admitted with hx of Calbicans candidemia, being treated at [facility] in [place] but left AMA and presents with MV endocarditis requiring MVR, post-op course complicated by shock post-op, junctional bradycardia, afib with RVR and pneumonia. PMH includes nephrolithiasis s/p ureteral stent and lithotripsy  12/01  Epistaxis requiring bilateral nasal packing by ENT; N/V noted; refusing antiemetic 12/02 RHC: sig reduced cardiac index likely d/t MR; normal filling pressures noted 12/05 NPO, OR: Redo MVR 12/06 Shock post op (R predominant)-significant pressor requirements (Levo at 34, Vaso at 0.04, Epi at 10, DBA at 5, milrinone  at 0.375); Cath Lab for Protek Duo Cannulation 12/08 Protek Duo De-Cannulation, Trickle TF initiated 12/10 Lasix  gtt initiated 12/12 Corrak placed, TF increased to 40 ml/hr 12/13 CRRT initiated  12/15 extubated  Spoke with RN who reports pt tolerating extubation and CRRT well. Pt will have swallow eval later today, but RN suspects pt may not do well due to continued lethargy and weakness, will monitor for  results. Pt's phosphorus low, electrolyte protocol recommends no repletion at current level, will continue to monitor.   Spoke with pt who was resting in bed. Pt very lethargic post extubation. Discussed ongoing nutrition support to help meet calorie and protein needs while recovering and to help pt get stronger until she can swallow, pt shook head in understanding.   Plan to possibly discontinue CRRT today or tomorrow. 5L removed in last 24 hr. BUN improved and creatinine WNL.   Continue banatrol for diarrhea.  Admit weight: 67.3 kg  Current weight: 74.5 kg Mild pitting edema BUE + BLE   Intake/Output Summary (Last 24 hours) at 02/18/2024 0903 Last data filed at 02/18/2024 0800 Gross per 24 hour  Intake 2711.19 ml  Output 5217.7 ml  Net -2506.51 ml   Net IO Since Admission: -2,990.23 mL [02/18/24 0903]  Drains/Lines: PICC double lumen R basilic HD triple lumen (non tunneled): -5161 ml x 24 h CRRT Urethral Cath: UOP 298 ml x 24 h Cortrak gastric  Nutritionally Relevant Medications: Scheduled Meds:  feeding supplement (PROSource TF20)  60 mL Per Tube Daily   fiber supplement (BANATROL TF)  60 mL Per Tube BID   insulin  aspart  0-9 Units Subcutaneous Q4H   insulin  aspart  2 Units Subcutaneous Q4H   insulin  glargine  15 Units Subcutaneous Daily   metoCLOPramide  (REGLAN ) injection  5 mg Intravenous Q8H   pantoprazole  (PROTONIX ) IV  40 mg Intravenous QHS   polyethylene glycol  17 g Per Tube Daily   Continuous Infusions:  dexmedetomidine  0.6 mcg/kg/hr (02/18/24 0800)   DOBUTamine  5  mcg/kg/min (02/18/24 0800)   feeding supplement (VITAL 1.5 CAL) 50 ml/h   norepinephrine  (LEVOPHED ) Adult infusion 6 mcg/min (02/18/24 0800)   prismasol  BGK 4/2.5 1,500 mL/hr at 02/18/24 9285   prismasol  BGK 4/2.5 400 mL/hr at 02/17/24 1914   prismasol  BGK 4/2.5 400 mL/hr at 02/17/24 8084   promethazine  (PHENERGAN ) injection (IM or IVPB)      Labs Reviewed: BUN 30 Phos 2.4 (low) CBG ranges  from 153-185 mg/dL over the last 24 hours HgbA1c 5.4   Diet Order:   Diet Order             Diet NPO time specified  Diet effective now                   EDUCATION NEEDS:   Education needs have been addressed  Skin:  Skin Assessment: Skin Integrity Issues: Skin Integrity Issues:: DTI DTI: buttocks Incisions: chest (closed)  Last BM:  12/14 type 7 x 2  Height:   Ht Readings from Last 1 Encounters:  02/15/24 5' 4 (1.626 m)    Weight:   Wt Readings from Last 1 Encounters:  02/18/24 74.5 kg    Ideal Body Weight:  54.5 kg  BMI:  Body mass index is 28.19 kg/m.  Estimated Nutritional Needs:   Kcal:  1900-2100 kcals  Protein:  100-115g  Fluid:  1.5 L   Josette Glance, MS, RDN, LDN Clinical Dietitian I Please reach out via secure chat

## 2024-02-18 NOTE — Evaluation (Addendum)
 Clinical/Bedside Swallow Evaluation Patient Details  Name: Tracy Baker MRN: [id number] Date of Birth: April 27, 1955  Today's Date: 02/18/2024 Time: SLP Start Time (ACUTE ONLY): 1358 SLP Stop Time (ACUTE ONLY): 1437 SLP Time Calculation (min) (ACUTE ONLY): 39 min  Past Medical History:  Past Medical History:  Diagnosis Date   Thyroid  disease    Past Surgical History:  Past Surgical History:  Procedure Laterality Date   APPENDECTOMY     AUGMENTATION MAMMAPLASTY Bilateral 1981   removed in 2006   BREAST IMPLANT REMOVAL Bilateral 2007   CARDIOVERSION N/A 01/22/2024   Procedure: CARDIOVERSION;  Surgeon: Rolan Ezra RAMAN, MD;  Location: [facility];  Service: Cardiovascular;  Laterality: N/A;   CHOLECYSTECTOMY  2010   COLONOSCOPY WITH PROPOFOL  N/A 06/30/2021   Procedure: COLONOSCOPY WITH PROPOFOL ;  Surgeon: Jinny Carmine, MD;  Location: [facility] ENDOSCOPY;  Service: Endoscopy;  Laterality: N/A;   CONTROL OF EPISTAXIS  02/04/2024   ECMO CANNULATION N/A 02/09/2024   Procedure: ECMO CANNULATION;  Surgeon: Zenaida Morene PARAS, MD;  Location: [facility];  Service: Cardiovascular;  Laterality: N/A;   INTRAOPERATIVE TRANSESOPHAGEAL ECHOCARDIOGRAM N/A 01/10/2024   Procedure: ECHOCARDIOGRAM, TRANSESOPHAGEAL, INTRAOPERATIVE;  Surgeon: Kerrin Elspeth BROCKS, MD;  Location: [facility] OR;  Service: Open Heart Surgery;  Laterality: N/A;   INTRAOPERATIVE TRANSESOPHAGEAL ECHOCARDIOGRAM N/A 02/08/2024   Procedure: ECHOCARDIOGRAM, TRANSESOPHAGEAL, INTRAOPERATIVE;  Surgeon: Kerrin Elspeth BROCKS, MD;  Location: [facility] OR;  Service: Open Heart Surgery;  Laterality: N/A;   LAPAROSCOPIC TOTAL HYSTERECTOMY  2006   MITRAL VALVE REPAIR N/A 01/10/2024   Procedure: REPAIR, MITRAL VALVE;  Surgeon: Kerrin Elspeth BROCKS, MD;  Location: [facility] OR;  Service: Open Heart Surgery;  Laterality: N/A;   MITRAL VALVE REPLACEMENT N/A 02/08/2024   Procedure: REPLACEMENT, MITRAL VALVE WITH MITRIS RESILIA MITRAL VALVE 27mm;  Surgeon:  Kerrin Elspeth BROCKS, MD;  Location: [facility] OR;  Service: Open Heart Surgery;  Laterality: N/A;   REDO STERNOTOMY N/A 02/08/2024   Procedure: REDO STERNOTOMY;  Surgeon: Kerrin Elspeth BROCKS, MD;  Location: [facility] OR;  Service: Open Heart Surgery;  Laterality: N/A;   RIGHT HEART CATH N/A 02/05/2024   Procedure: RIGHT HEART CATH;  Surgeon: Zenaida Morene PARAS, MD;  Location: [facility] INVASIVE CV LAB;  Service: Cardiovascular;  Laterality: N/A;   RIGHT/LEFT HEART CATH AND CORONARY ANGIOGRAPHY N/A 01/04/2024   Procedure: RIGHT/LEFT HEART CATH AND CORONARY ANGIOGRAPHY;  Surgeon: Verlin Lonni BIRCH, MD;  Location: [facility];  Service: Cardiovascular;  Laterality: N/A;   TOOTH EXTRACTION N/A 01/07/2024   Procedure: DENTAL RESTORATION/EXTRACTIONS;  Surgeon: Sheryle Hamilton, DMD;  Location: [facility] OR;  Service: Oral Surgery;  Laterality: N/A;  EXTRACTION OF TEETH #2, #6, #7, #16, #29   TRANSESOPHAGEAL ECHOCARDIOGRAM (CATH LAB) N/A 01/01/2024   Procedure: TRANSESOPHAGEAL ECHOCARDIOGRAM;  Surgeon: Lonni Slain, MD;  Location: [facility] INVASIVE CV LAB;  Service: Cardiovascular;  Laterality: N/A;   TRANSESOPHAGEAL ECHOCARDIOGRAM (CATH LAB) N/A 01/22/2024   Procedure: TRANSESOPHAGEAL ECHOCARDIOGRAM;  Surgeon: Rolan Ezra RAMAN, MD;  Location: [facility] INVASIVE CV LAB;  Service: Cardiovascular;  Laterality: N/A;   HPI:  68 yo female admitted 10/25 after leaving AMA from [facility] where she was being treated for C. Albicans candidemia. TEE with mitral valve vegetation 10/28. Dental extractions 11/3. Mitral valve repair, ETT <24 hrs 11/6-11/7 (passed yale post-extubation). Redo sternotomy with MVR 12/5, developed R ventricular failure 12/6, requiring ECMO cannulation 12/6-12/8. Remained intubated post-operatively 12/5-12/15. Started CRRT 12/13. PMH includes thyroid  disease    Assessment / Plan / Recommendation  Clinical Impression  Pt  is acutely deconditioned and presents with aphonia s/p prolonged intubation. Recommend she  remain NPO with ongoing SLP f/u to determine readiness to participate in an instrumental swallow study as mentation allows.   Session was completed with PT/OT to facilitate brief positioning at EOB. Pt presents with aphonia and huff-like coughing s/p ten day intubation. She requires cueing for functional tasks and shows decreased interest and attention to POs overall. Prolonged coughing follows limited trials of ice chips and water . No coughing/throat clearance followed limited volumes of puree. Deferred large volumes given high risk of adverse events in the presence of aspiration (poor oral hygiene, prolonged intubation, deconditioning, fatigue).   SLP Visit Diagnosis: Dysphagia, unspecified (R13.10)    Aspiration Risk  Moderate aspiration risk    Diet Recommendation           Other Recommendations Oral Care Recommendations: Oral care QID     Swallow Evaluation Recommendations Recommendations: NPO Medication Administration: Via alternative means Oral care recommendations: Oral care QID (4x/day)   Assistance Recommended at Discharge    Functional Status Assessment Patient has had a recent decline in their functional status and demonstrates the ability to make significant improvements in function in a reasonable and predictable amount of time.  Frequency and Duration min 2x/week  2 weeks       Prognosis Prognosis for improved oropharyngeal function: Good Barriers to Reach Goals: Time post onset;Severity of deficits      Swallow Study   General HPI: 68 yo female admitted 10/25 after leaving AMA from [facility] where she was being treated for C. Albicans candidemia. TEE with mitral valve vegetation 10/28. Dental extractions 11/3. Mitral valve repair, ETT <24 hrs 11/6-11/7 (passed yale post-extubation). Redo sternotomy with MVR 12/5, developed R ventricular failure 12/6, requiring ECMO cannulation 12/6-12/8. Remained intubated post-operatively 12/5-12/15. Started CRRT 12/13. PMH includes  thyroid  disease Type of Study: Bedside Swallow Evaluation Previous Swallow Assessment: none in chart Diet Prior to this Study: NPO;Cortrak/Small bore NG tube Temperature Spikes Noted: No Respiratory Status: Room air History of Recent Intubation: Yes Total duration of intubation (days): 10 days Date extubated: 02/18/24 Behavior/Cognition: Alert;Cooperative;Requires cueing Oral Cavity Assessment: Within Functional Limits Oral Care Completed by SLP: No Oral Cavity - Dentition: Missing dentition (recent extractions of 5 upper teeth 11/3, also wears partials at baseline) Vision: Functional for self-feeding Self-Feeding Abilities: Total assist Patient Positioning: Upright in bed Baseline Vocal Quality: Aphonic Volitional Cough: Weak Volitional Swallow: Able to elicit    Oral/Motor/Sensory Function Overall Oral Motor/Sensory Function: Within functional limits   Ice Chips Ice chips: Impaired Presentation: Spoon Pharyngeal Phase Impairments: Multiple swallows;Wet Vocal Quality;Cough - Immediate   Thin Liquid Thin Liquid: Impaired Presentation: Straw Pharyngeal  Phase Impairments: Multiple swallows;Wet Vocal Quality;Cough - Immediate    Nectar Thick Nectar Thick Liquid: Not tested   Honey Thick Honey Thick Liquid: Not tested   Puree Puree: Within functional limits Presentation: Spoon   Solid     Solid: Not tested      Damien Blumenthal, M.A., CCC-SLP Speech Language Pathology, Acute Rehabilitation Services  Secure Chat preferred [phone]  02/18/2024,3:04 PM

## 2024-02-18 NOTE — Progress Notes (Signed)
 Physical Therapy Treatment Patient Details Name: Tracy Baker MRN: [id number] DOB: [birth year] Today's Date: 02/18/2024   History of Present Illness 68 y.o. female adm 12/29/2023 after leaving AMA from [facility] (where she was on vacation due to lack of formal transfer) where she was being treated for C. Albicans candidemia. 10/28 TEE with mitral valve vegetation. 11/3 teeth Extractions. 11/6 Mitral valve repair. 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 PMHx: thyroid  disease.    PT Comments  The pt was agreeable to session with focus on progressing OOB mobility. The pt continues to need totalA to complete transition to sitting EOB due to combination of sternal precautions and weakness. Pt also dependent on constant support to maintain sitting EOB this session due to posterior lean, and tolerated ~5 min sitting EOB prior to hypotension requiring return to supine. Pt then tolerated chair position in bed well and BP improved. Will continue to benefit from skilled PT acutely to progress functional strength, activity tolerance, and balance. Anticipate she will need intensive therapies once more medically stable for d/c.    VITALS:  - supine in bed- BP: 112/69 (82); HR: 120bpm - sitting EOB - BP: 82/48 (59); HR: 120bpm - sitting in chair position in bed 45 deg - BP: 81/58 (65); HR: 120bpm - sitting in chair position in bed 50 deg - BP: 85/65 (73); HR: 117bpm     If plan is discharge home, recommend the following: Assistance with cooking/housework;Assist for transportation;Help with stairs or ramp for entrance;Two people to help with walking and/or transfers;Two people to help with bathing/dressing/bathroom;Assistance with feeding;Direct supervision/assist for medications management;Direct supervision/assist for financial management   Can travel by private vehicle        Equipment Recommendations  Other (comment) (TBD)     Recommendations for Other Services Rehab consult     Precautions / Restrictions Precautions Precautions: Fall;Sternal Precaution Booklet Issued: No Recall of Precautions/Restrictions: Impaired Precaution/Restrictions Comments: A-line; temp pacer; OG tube, foley, CCRT Restrictions Weight Bearing Restrictions Per Provider Order: No Other Position/Activity Restrictions: Cardiac sternal precautions     Mobility  Bed Mobility Overal bed mobility: Needs Assistance Bed Mobility: Rolling, Supine to Sit, Sit to Supine Rolling: Total assist, +2 for physical assistance, +2 for safety/equipment   Supine to sit: Total assist, +2 for physical assistance, +2 for safety/equipment, HOB elevated Sit to supine: Total assist, +2 for physical assistance   General bed mobility comments: totalA to safely achieve EOB, max A posterior support. no attempt from pt to assist with LE    Transfers                   General transfer comment: deferred due to hypotension 82/48 (59) when sitting EOB    Ambulation/Gait               General Gait Details: deferred   Stairs             Wheelchair Mobility     Tilt Bed    Modified Rankin (Stroke Patients Only)       Balance Overall balance assessment: Needs assistance Sitting-balance support: No upper extremity supported, Feet unsupported Sitting balance-Leahy Scale: Poor Sitting balance - Comments: initially maxA, moments of modA but constant posterior LOB with reduction in support Postural control: Posterior lean  Communication Communication Communication: Impaired Factors Affecting Communication:  (soft spoken)  Cognition Arousal: Alert Behavior During Therapy: Flat affect   PT - Cognitive impairments: Difficult to assess                       PT - Cognition Comments: pt visually tracking, attempting to answer some questions in session but with generally  slowed processing, minimal command following Following commands: Impaired Following commands impaired: Only follows one step commands consistently    Cueing Cueing Techniques: Verbal cues, Tactile cues  Exercises      General Comments General comments (skin integrity, edema, etc.): hypotension with sitting EOB, map returned to 60s with return to supine. HR 120bpm, SpO2 92% on RA      Pertinent Vitals/Pain Pain Assessment Pain Assessment: Faces Faces Pain Scale: Hurts a little bit Pain Location: with noxious stimulli Pain Descriptors / Indicators: Discomfort Pain Intervention(s): Limited activity within patient's tolerance, Monitored during session    Home Living                          Prior Function            PT Goals (current goals can now be found in the care plan section) Acute Rehab PT Goals Patient Stated Goal: unable to state Progress towards PT goals: Progressing toward goals    Frequency    Min 3X/week      PT Plan      Co-evaluation PT/OT/SLP Co-Evaluation/Treatment: Yes Reason for Co-Treatment: To address functional/ADL transfers;For patient/therapist safety;Complexity of the patient's impairments (multi-system involvement);Necessary to address cognition/behavior during functional activity PT goals addressed during session: Mobility/safety with mobility;Strengthening/ROM;Balance OT goals addressed during session: ADL's and self-care;Strengthening/ROM SLP goals addressed during session: Swallowing    AM-PAC PT 6 Clicks Mobility   Outcome Measure  Help needed turning from your back to your side while in a flat bed without using bedrails?: Total Help needed moving from lying on your back to sitting on the side of a flat bed without using bedrails?: Total Help needed moving to and from a bed to a chair (including a wheelchair)?: Total Help needed standing up from a chair using your arms (e.g., wheelchair or bedside chair)?: Total Help  needed to walk in hospital room?: Total Help needed climbing 3-5 steps with a railing? : Total 6 Click Score: 6    End of Session   Activity Tolerance: Patient limited by lethargy;Patient limited by fatigue Patient left: in bed;with call bell/phone within reach;with bed alarm set;with nursing/sitter in room Nurse Communication: Mobility status;Other (comment) (BM) PT Visit Diagnosis: Other abnormalities of gait and mobility (R26.89);Muscle weakness (generalized) (M62.81);Difficulty in walking, not elsewhere classified (R26.2)     Time: 8577-8554 PT Time Calculation (min) (ACUTE ONLY): 23 min  Charges:    $Therapeutic Activity: 8-22 mins PT General Charges $$ ACUTE PT VISIT: 1 Visit                     Izetta Call, PT, DPT   Acute Rehabilitation Department Office [phone] Secure Chat Communication Preferred   Izetta JULIANNA Call 02/18/2024, 4:25 PM

## 2024-02-18 NOTE — Progress Notes (Signed)
 BLE venous duplex has been completed.   Results can be found under chart review under CV PROC. 02/18/2024 3:41 PM Ilayda Toda RVT, RDMS

## 2024-02-19 ENCOUNTER — Inpatient Hospital Stay (HOSPITAL_COMMUNITY)

## 2024-02-19 DIAGNOSIS — J1569 Pneumonia due to other gram-negative bacteria: Secondary | ICD-10-CM | POA: Diagnosis not present

## 2024-02-19 DIAGNOSIS — L89319 Pressure ulcer of right buttock, unspecified stage: Secondary | ICD-10-CM | POA: Diagnosis not present

## 2024-02-19 DIAGNOSIS — I059 Rheumatic mitral valve disease, unspecified: Secondary | ICD-10-CM | POA: Diagnosis not present

## 2024-02-19 DIAGNOSIS — R570 Cardiogenic shock: Secondary | ICD-10-CM | POA: Diagnosis not present

## 2024-02-19 DIAGNOSIS — I50811 Acute right heart failure: Secondary | ICD-10-CM

## 2024-02-19 DIAGNOSIS — N179 Acute kidney failure, unspecified: Secondary | ICD-10-CM | POA: Diagnosis not present

## 2024-02-19 DIAGNOSIS — N183 Chronic kidney disease, stage 3 unspecified: Secondary | ICD-10-CM | POA: Diagnosis not present

## 2024-02-19 DIAGNOSIS — N1831 Chronic kidney disease, stage 3a: Secondary | ICD-10-CM | POA: Diagnosis not present

## 2024-02-19 DIAGNOSIS — J9601 Acute respiratory failure with hypoxia: Secondary | ICD-10-CM | POA: Diagnosis not present

## 2024-02-19 DIAGNOSIS — D689 Coagulation defect, unspecified: Secondary | ICD-10-CM | POA: Diagnosis not present

## 2024-02-19 DIAGNOSIS — L89329 Pressure ulcer of left buttock, unspecified stage: Secondary | ICD-10-CM | POA: Diagnosis not present

## 2024-02-19 DIAGNOSIS — D62 Acute posthemorrhagic anemia: Secondary | ICD-10-CM | POA: Diagnosis not present

## 2024-02-19 LAB — CBC
HCT: 36.7 % (ref 36.0–46.0)
Hemoglobin: 11.6 g/dL — ABNORMAL LOW (ref 12.0–15.0)
MCH: 31.5 pg (ref 26.0–34.0)
MCHC: 31.6 g/dL (ref 30.0–36.0)
MCV: 99.7 fL (ref 80.0–100.0)
Platelets: 58 K/uL — ABNORMAL LOW (ref 150–400)
RBC: 3.68 MIL/uL — ABNORMAL LOW (ref 3.87–5.11)
RDW: 20.4 % — ABNORMAL HIGH (ref 11.5–15.5)
WBC: 12.1 K/uL — ABNORMAL HIGH (ref 4.0–10.5)
nRBC: 0 % (ref 0.0–0.2)

## 2024-02-19 LAB — COOXEMETRY PANEL
Carboxyhemoglobin: 2 % — ABNORMAL HIGH (ref 0.5–1.5)
Carboxyhemoglobin: 2.2 % — ABNORMAL HIGH (ref 0.5–1.5)
Methemoglobin: <0.7 % (ref 0.0–1.5)
Methemoglobin: <0.7 % (ref 0.0–1.5)
O2 Saturation: 52.5 %
O2 Saturation: 54.2 %
Total hemoglobin: 12 g/dL (ref 12.0–16.0)
Total hemoglobin: 11.8 g/dL — ABNORMAL LOW (ref 12.0–16.0)

## 2024-02-19 LAB — RENAL FUNCTION PANEL
Albumin: 2.7 g/dL — ABNORMAL LOW (ref 3.5–5.0)
Albumin: 3.4 g/dL — ABNORMAL LOW (ref 3.5–5.0)
Anion gap: 10 (ref 5–15)
Anion gap: 10 (ref 5–15)
BUN: 24 mg/dL — ABNORMAL HIGH (ref 8–23)
BUN: 40 mg/dL — ABNORMAL HIGH (ref 8–23)
CO2: 27 mmol/L (ref 22–32)
CO2: 26 mmol/L (ref 22–32)
Calcium: 8.7 mg/dL — ABNORMAL LOW (ref 8.9–10.3)
Calcium: 8.9 mg/dL (ref 8.9–10.3)
Chloride: 102 mmol/L (ref 98–111)
Chloride: 102 mmol/L (ref 98–111)
Creatinine, Ser: 0.83 mg/dL (ref 0.44–1.00)
Creatinine, Ser: 1.25 mg/dL — ABNORMAL HIGH (ref 0.44–1.00)
GFR, Estimated: >60 mL/min (ref >60)
GFR, Estimated: 47 mL/min — ABNORMAL LOW (ref >60)
Glucose, Bld: 149 mg/dL — ABNORMAL HIGH (ref 70–99)
Glucose, Bld: 100 mg/dL — ABNORMAL HIGH (ref 70–99)
Phosphorus: 3.8 mg/dL (ref 2.5–4.6)
Phosphorus: 3.4 mg/dL (ref 2.5–4.6)
Potassium: 4.4 mmol/L (ref 3.5–5.1)
Potassium: 4.8 mmol/L (ref 3.5–5.1)
Sodium: 139 mmol/L (ref 135–145)
Sodium: 138 mmol/L (ref 135–145)

## 2024-02-19 LAB — GLUCOSE, CAPILLARY
Glucose-Capillary: 132 mg/dL — ABNORMAL HIGH (ref 70–99)
Glucose-Capillary: 150 mg/dL — ABNORMAL HIGH (ref 70–99)
Glucose-Capillary: 172 mg/dL — ABNORMAL HIGH (ref 70–99)
Glucose-Capillary: 96 mg/dL (ref 70–99)
Glucose-Capillary: 131 mg/dL — ABNORMAL HIGH (ref 70–99)

## 2024-02-19 LAB — HEPARIN LEVEL (UNFRACTIONATED)
Heparin Unfractionated: 0.1 [IU]/mL — ABNORMAL LOW (ref 0.30–0.70)
Heparin Unfractionated: 0.12 [IU]/mL — ABNORMAL LOW (ref 0.30–0.70)

## 2024-02-19 LAB — HEPATITIS B SURFACE ANTIGEN: Hepatitis B Surface Ag: NONREACTIVE

## 2024-02-19 LAB — MAGNESIUM: Magnesium: 2.5 mg/dL — ABNORMAL HIGH (ref 1.7–2.4)

## 2024-02-19 MED ORDER — AMIODARONE HCL IN DEXTROSE 360-4.14 MG/200ML-% IV SOLN
30.0000 mg/h | INTRAVENOUS | Status: DC
  Filled 2024-02-19: qty 200

## 2024-02-19 MED ORDER — POLYETHYLENE GLYCOL 3350 17 G PO PACK: 17.0000 g | PACK | Freq: Every day | ORAL | Status: DC | PRN

## 2024-02-19 MED ORDER — SODIUM CHLORIDE 0.9 % IV SOLN
2.0000 g | Freq: Once | INTRAVENOUS | Status: AC
  Administered 2024-02-19: 14:00:00 2 g via INTRAVENOUS

## 2024-02-19 MED ORDER — SODIUM CHLORIDE 0.9 % IV SOLN
INTRAVENOUS | Status: AC
  Administered 2024-02-19: 22:00:00 2 g via INTRAVENOUS
  Filled 2024-02-19: qty 12.5

## 2024-02-19 MED ORDER — QUETIAPINE FUMARATE 25 MG PO TABS
25.0000 mg | ORAL_TABLET | Freq: Two times a day (BID) | ORAL | Status: DC
  Administered 2024-02-19 – 2024-02-22 (×8): 25 mg
  Filled 2024-02-19 (×7): qty 1

## 2024-02-19 MED ORDER — MIRTAZAPINE 15 MG PO TABS
15.0000 mg | ORAL_TABLET | Freq: Every day | ORAL | Status: DC
  Administered 2024-02-19 – 2024-02-21 (×3): 15 mg
  Filled 2024-02-19 (×3): qty 1

## 2024-02-19 MED ORDER — AMIODARONE HCL IN DEXTROSE 360-4.14 MG/200ML-% IV SOLN: 30.0000 mg/h | INTRAVENOUS | Status: DC

## 2024-02-19 MED ORDER — AMIODARONE HCL IN DEXTROSE 360-4.14 MG/200ML-% IV SOLN
30.0000 mg/h | INTRAVENOUS | Status: DC
  Administered 2024-02-19 – 2024-02-20 (×4): 30 mg/h via INTRAVENOUS
  Filled 2024-02-19 (×4): qty 200

## 2024-02-19 MED ORDER — SODIUM CHLORIDE 0.9 % IV SOLN
2.0000 g | Freq: Two times a day (BID) | INTRAVENOUS | Status: AC
  Filled 2024-02-19: qty 12.5

## 2024-02-19 MED ORDER — QUETIAPINE FUMARATE 25 MG PO TABS
25.0000 mg | ORAL_TABLET | Freq: Two times a day (BID) | ORAL | Status: DC
  Filled 2024-02-19: qty 1

## 2024-02-19 MED ORDER — FUROSEMIDE 10 MG/ML IJ SOLN
100.0000 mg | Freq: Once | INTRAVENOUS | Status: AC
  Administered 2024-02-19: 13:00:00 100 mg via INTRAVENOUS
  Filled 2024-02-19: qty 10

## 2024-02-19 NOTE — Progress Notes (Signed)
° °  [address] [place] [employer] [address]             [phone]    Sleeping  BP 118/71   Pulse (!) 107   Temp 99.3 F (37.4 C)   Resp (!) 21   Ht 5' 4 (1.626 m)   Wt 71.8 kg   SpO2 93%   BMI 27.17 kg/m  CVP 13 Co-ox 54   Intake/Output Summary (Last 24 hours) at 02/19/2024 1731 Last data filed at 02/19/2024 0800 Gross per 24 hour  Intake 1477.34 ml  Output 3059 ml  Net -1581.66 ml   Minimal response to Lasix  earlier- repeat challenge tomorrow  Elspeth C. Kerrin, MD [facility] [phone]

## 2024-02-19 NOTE — Progress Notes (Addendum)
 Patient ID: Tracy Baker, female   DOB: 1955-11-21, 68 y.o.   MRN: [id number]     Advanced Heart Failure Rounding Note  Cardiologist: Lonni Cash, MD  Chief Complaint: S/p MV Repair / MV endocarditis  Patient Profile  Tracy Baker is a 68 y.o. female with hx of recent nephrolithiasis s/p ureteral stent and lithotripsy at [facility] 10/10 with stent removal. 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 Post-op course c/b post cardiotomy shock, junctional bradycardia, afib w/ RVR and HAP.  Subjective:    RHC 12/2 (on 0.125 of Milrinone ): RA 4, PA 42/13 (22), PCW 12 with Vwaves to 20, CO/CI TD 3.37/2.03, PAPi 7.5  12/5 S/P Redo MVR - Intra Op given  4UPRBCs, 2UPLTs, 2FFP, 1Cyro 12/6 Protek Duo cannulation 12/8 Decannulated.  Post-op echo showed EF 50%, RV mildly dilated and mildly dysfunctional, s/p bioprosthetic mitral valve (stable). 12/9 Diuresed with IV lasix  120 mg x1.  12/10 lasix  gtt started.  12/13 CVVH begun  CVP 6/7, on CVVH. -4.8 removed, net - 2.3L   Currently on dobutamine  5.  Co-ox 53%.    She remains on cefepime  and fluconazole . Tm 100.4.   Extubated yesterday. Feels ok this morning just weak. Denies SOB.   Objective:    Weight Range: 71.8 kg Body mass index is 27.17 kg/m.   Vital Signs:   Temp:  [99 F (37.2 C)-100.4 F (38 C)] 99 F (37.2 C) (12/16 0645) Pulse Rate:  [95-122] 101 (12/16 0645) Resp:  [11-37] 17 (12/16 0645) BP: (80-127)/(48-96) 101/67 (12/16 0645) SpO2:  [78 %-98 %] 94 % (12/16 0645) FiO2 (%):  [40 %] 40 % (12/15 0800) Weight:  [71.8 kg] 71.8 kg (12/16 0500) Last BM Date : 02/17/24  Weight change: Filed Weights   02/17/24 0430 02/18/24 0500 02/19/24 0500  Weight: 77.9 kg 74.5 kg 71.8 kg   Intake/Output:  Intake/Output Summary (Last 24 hours) at 02/19/2024 0740 Last data filed at 02/19/2024 0700 Gross per 24 hour  Intake 2526.07 ml  Output 4907.8 ml  Net -2381.73 ml   Physical Exam    General:  chronically ill appearing Neck: JVD UTA.  Cor: Irregular rate & rhythm.  Lungs: diminished throughout Extremities: +1 BLE edema Neuro: Follows commands  Telemetry   A fib low 100s (personally reviewed)   Labs    CBC Recent Labs    02/18/24 1451 02/19/24 0524  WBC 13.3* 12.1*  HGB 11.8* 11.6*  HCT 37.3 36.7  MCV 100.3* 99.7  PLT 59* 58*   Basic Metabolic Panel Recent Labs    87/84/74 0422 02/18/24 1658 02/19/24 0500 02/19/24 0524  NA 138 137 139  --   K 4.3 4.6 4.4  --   CL 102 100 102  --   CO2 28 28 27   --   GLUCOSE 149* 103* 149*  --   BUN 30* 30* 24*  --   CREATININE 0.82 0.81 0.83  --   CALCIUM  8.2* 8.4* 8.7*  --   MG 2.4  --   --  2.5*  PHOS 2.4* 1.9* 3.8  --    Liver Function Tests Recent Labs    02/17/24 0423 02/17/24 1617 02/18/24 1658 02/19/24 0500  AST 27  --   --   --   ALT 12  --   --   --   ALKPHOS 209*  --   --   --   BILITOT 1.3*  --   --   --   PROT 5.9*  --   --   --  ALBUMIN  2.5*  2.5*   < > 2.6* 2.7*   < > = values in this interval not displayed.    Medications:    Scheduled Medications:  feeding supplement (PROSource TF20)  60 mL Per Tube Daily   fiber supplement (BANATROL TF)  60 mL Per Tube BID   Gerhardt's butt cream   Topical BID   insulin  aspart  0-9 Units Subcutaneous Q4H   insulin  aspart  2 Units Subcutaneous Q4H   insulin  glargine  15 Units Subcutaneous Daily   mirtazapine   15 mg Oral QHS   mouth rinse  15 mL Mouth Rinse 4 times per day   pantoprazole  (PROTONIX ) IV  40 mg Intravenous QHS   polyethylene glycol  17 g Per Tube Daily   QUEtiapine   25 mg Per Tube BID   scopolamine   1 patch Transdermal Q72H   sodium chloride  flush  10-40 mL Intracatheter Q12H    Infusions:  ceFEPime  (MAXIPIME ) IV Stopped (02/18/24 2256)   DOBUTamine  5 mcg/kg/min (02/19/24 0700)   feeding supplement (VITAL 1.5 CAL) 50 mL/hr at 02/19/24 0700   fluconazole  (DIFLUCAN ) IV Stopped (02/18/24 1133)   heparin  500 Units/hr  (02/19/24 0700)   norepinephrine  (LEVOPHED ) Adult infusion Stopped (02/18/24 1519)   prismasol  BGK 4/2.5 1,500 mL/hr at 02/19/24 0726   prismasol  BGK 4/2.5 400 mL/hr at 02/18/24 2033   prismasol  BGK 4/2.5 400 mL/hr at 02/18/24 2032   promethazine  (PHENERGAN ) injection (IM or IVPB) Stopped (02/19/24 0031)    PRN Medications: albuterol , fentaNYL  (SUBLIMAZE ) injection, heparin , mouth rinse, polyethylene glycol, promethazine  (PHENERGAN ) injection (IM or IVPB), sodium chloride  flush  Assessment/Plan   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 Kerrin for Candida endocarditis. Pre-op L/RHC 01/04/24 with no CAD and normal L/R heart pressures.  - Failed initial Milrinone  wean. Eventually weaned off, unfortunately restarted 12/1 for low output.  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 - Respiratory culture- Enterobacter, On cefepime , vancomycin , and fluconazole . Vancomycin  completed.  Low grade fever noted 12/15. Repeat sputum cultures and blood cultures sent yesterday  - She is on CVVH, I/Os net negative 2381 yesterday.  CVP 6/7.  Per nephrology at bedside, plan to stop CVVH today and reevaluate tomorrow for possible iHD. Lasix  challenge today.  - Suspect that this is primarily RV failure. Continue RV support with dobutamine  5.    Acute hypoxic respiratory failure: Suspect component of infection/aspiration PNA given unilateral disease.  - Respiratory culture- Enterobacter, On cefepime  and fluconazole .  - Extubated 12/15   Coagulopathy/thrombocytopenia/ABLA anemia:  - Thrombocytopenia present, platelets  61 => 64 > 53>58 today.  Suspect this is inflammatory due to critical illness.  - INR now back to normal, PTT not significantly elevated, no DIC - Continue heparin  gtt  Sick sinus - Initially with asystole underneath (as of 12/10) - Now stable rhythm, NSR alternating with accelerated junctional and atrial fibrillation.   Afib - Post operative - Continue heparin  gtt - a fib this morning. Will start amiodarone  gtt + bolus. Follow HR closely.   AKI on CKD 3b - Now CVVH with poor diuresis and rising BUN.  See above.  - Mgmt per nephrology  Protein-calorie  malnutrition - On TF  Epistaxis  - s/p bilateral nasal packing per ENT 12/1     Pressure Ulcer R Buttock, DTI - Q2 turns, offload  Left radial artery occluded - Left ulnar artery is patent.  - Continue heparin  gtt   CRITICAL CARE Performed by: Beckey LITTIE Coe   Total critical care time: 16 minutes  Critical care time was exclusive of separately billable procedures and treating other patients.  Critical care was necessary to treat or prevent imminent or life-threatening deterioration.  Critical care was time spent personally by me on the following activities: development of treatment plan with patient and/or surrogate as well as nursing, discussions with consultants, evaluation of patient's response to treatment, examination of patient, obtaining history from patient or surrogate, ordering and performing treatments and interventions, ordering and review of laboratory studies, ordering and review of radiographic studies, pulse oximetry and re-evaluation of patient's condition.   Beckey LITTIE Coe 02/19/2024 7:40 AM  Patient seen with NP, I formulated the plan and agree with the above note.   I/Os net negative 2382 cc with CVVH yesterday, weight down 6 lbs.  CVP 9-10 on my read.  Co-ox lower today at 53%, she remains on dobutamine  5.   She is in AF with mild RV 100s, on heparin  gtt.  Platelets stably low at 58K.    PCT 1.24, she  is on cefepime  and fluconazole .   General: NAD Neck: JVP 8-9 cm, no thyromegaly or thyroid  nodule.  Lungs: Clear to auscultation bilaterally with normal respiratory effort. CV: Nondisplaced PMI.  Heart irregular S1/S2, no S3/S4, no murmur.  1+ edema to thighs.  Abdomen: Soft, nontender, no hepatosplenomegaly, no distention.  Skin: Intact without lesions or rashes.  Neurologic: Alert and oriented x 3.  Psych: Normal affect. Extremities: No clubbing or cyanosis.  HEENT: Normal.   CVVH stopped this morning.  She has CVP 9-10.  Per renal, will get Lasix  IV challenge later today and will follow response.   Marginal co-ox at 53% on dobutamine  5.  Will not change, resend co-ox.  She is off NE with SBP 90s-100s.    Afebrile, on cefepime /fluconazole .  12/13 trach aspirate with Enterobacter.  PCT 1.24 yesterday.    Platelets stably low at 58K.  Thrombocytopenia predates heparin , discussed with pharmacy and think not due to HIT.  Continue heparin  gtt.  She is in atrial fibrillation with HR 100s.  Will restart amiodarone  gtt 30 mg/hr today.   Very weak, need to mobilize.   CRITICAL CARE Performed by: Ezra Shuck  Total critical care time: 35 minutes  Critical care time was exclusive of separately billable procedures and treating other patients.  Critical care was necessary to treat or prevent imminent or life-threatening deterioration.  Critical care was time spent personally by me on the following activities: development of treatment plan with patient and/or surrogate as well as nursing, discussions with consultants, evaluation of patient's response to treatment, examination of patient, obtaining history from patient or surrogate, ordering and performing treatments and interventions, ordering and review of laboratory studies, ordering and review of radiographic studies, pulse oximetry and re-evaluation of patient's condition.  Ezra Shuck 02/19/2024 9:54 AM

## 2024-02-19 NOTE — Progress Notes (Signed)
 10 Days Post-Op Procedures (LRB): ECMO CANNULATION (N/A) Subjective: Some nausea overnight, none at present Denies pain  Objective: Vital signs in last 24 hours: Temp:  [99 F (37.2 C)-100.4 F (38 C)] 99 F (37.2 C) (12/16 0645) Pulse Rate:  [95-122] 101 (12/16 0645) Cardiac Rhythm: Atrial fibrillation;Bundle branch block (12/16 0410) Resp:  [11-37] 17 (12/16 0645) BP: (80-127)/(48-96) 101/67 (12/16 0645) SpO2:  [78 %-98 %] 94 % (12/16 0645) FiO2 (%):  [40 %] 40 % (12/15 0800) Weight:  [71.8 kg] 71.8 kg (12/16 0500)  Hemodynamic parameters for last 24 hours: CVP:  [4 mmHg-13 mmHg] 13 mmHg  Intake/Output from previous day: 12/15 0701 - 12/16 0700 In: 2526.1 [I.V.:301.2; NG/GT:1515.8; IV Piggyback:709] Out: 4907.8 [Urine:93; Emesis/NG output:5] Intake/Output this shift: No intake/output data recorded.  General appearance: alert, cooperative, and no distress Neurologic: intact Heart: irregularly irregular rhythm Lungs: clear to auscultation bilaterally Abdomen: normal findings: soft, non-tender Extremities: well perfused Wound: clean and dry  Lab Results: Recent Labs    02/18/24 1451 02/19/24 0524  WBC 13.3* 12.1*  HGB 11.8* 11.6*  HCT 37.3 36.7  PLT 59* 58*   BMET:  Recent Labs    02/18/24 1658 02/19/24 0500  NA 137 139  K 4.6 4.4  CL 100 102  CO2 28 27  GLUCOSE 103* 149*  BUN 30* 24*  CREATININE 0.81 0.83  CALCIUM  8.4* 8.7*    PT/INR: No results for input(s): LABPROT, INR in the last 72 hours. ABG    Component Value Date/Time   PHART 7.340 (L) 02/13/2024 1618   HCO3 18.0 (L) 02/13/2024 1618   TCO2 19 (L) 02/13/2024 1618   ACIDBASEDEF 7.0 (H) 02/13/2024 1618   O2SAT 52.5 02/19/2024 0530   CBG (last 3)  Recent Labs    02/18/24 2347 02/19/24 0354 02/19/24 0723  GLUCAP 125* 132* 150*    Assessment/Plan: S/P Procedures (LRB): ECMO CANNULATION (N/A) Exubated yesterday NEURO- intact  Deconditioning severe- PT/OT CV- in atrial fib  this AM  Rate relatively well controlled  Dobutamine  5, co-ox 52  Would benefit from SR, probably try amiodarone  1st- d/w Cards  On heparin  drip, change to Eliquis  once taking PO RESP- extubated yesterday  CXR looks great, on RA  Pulmonary hygiene ID- on Maxipime  for enterobacter  Diflucan  for Candida RENAL- creatinine normal, but has been on CRRT  Stop CRRT this morning  Monitor renal function ENDO- CBG well controlled GI- tolerating TF, Reassess swallowing this AM  Start diet once swallow cleared  Scopolamine  patch, PRN phenergan  for nausea   LOS: 48 days    [name] 02/19/2024

## 2024-02-19 NOTE — Progress Notes (Signed)
 Orthopedic Tech Progress Note Patient Details:  Tracy Baker 06-17-1955 [id number]  Ortho Devices Type of Ortho Device: Radio broadcast assistant Ortho Device/Splint Location: Bilateral Ortho Device/Splint Interventions: Ordered, Application, Adjustment   Post Interventions Patient Tolerated: Fair Instructions Provided: Adjustment of device, Care of device  [name] F [name] 02/19/2024, 1:09 PM

## 2024-02-19 NOTE — Plan of Care (Signed)
°  Problem: Clinical Measurements: Goal: Ability to maintain clinical measurements within normal limits will improve Outcome: Progressing Goal: Will remain free from infection Outcome: Progressing Goal: Diagnostic test results will improve Outcome: Progressing Goal: Respiratory complications will improve Outcome: Progressing Goal: Cardiovascular complication will be avoided Outcome: Progressing   Problem: Activity: Goal: Risk for activity intolerance will decrease Pt goal to sit in chair for meals prior to discharge Problem: Elimination: Goal: Will not experience complications related to bowel motility Outcome: Progressing Goal: Will not experience complications related to urinary retention Outcome: Progressing   Problem: Safety: Goal: Ability to remain free from injury will improve Outcome: Progressing   Problem: Pain Managment: Goal: General experience of comfort will improve and/or be controlled Outcome: Progressing   Outcome: Progressing

## 2024-02-19 NOTE — Progress Notes (Signed)
 [facility] Progress Note    Assessment/ Plan:   AKI -b/l Cr 0.4-0.8. 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 CRRT started 12/13 to help offload volume and given encephalopathy/uremia. Was unable to wean from ventilator. Now extubated 12/15 -will stop CRRT today. Lasix  challenge later today. Tentative plan for HD in the next 24 to 48 hours -Avoid nephrotoxic medications including NSAIDs and iodinated intravenous contrast exposure unless the latter is absolutely indicated.  Preferred narcotic agents for pain control are hydromorphone , fentanyl , and methadone . Morphine should not be used. Avoid Baclofen and avoid oral sodium phosphate  and magnesium  citrate based laxatives / bowel preps. Continue strict Input and Output monitoring. 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 On cefepime , fluconazole ,  -on dobuatmine -UF as tolerated with CRRT  AHRF -extubated on 12/15 -UF as tolerated  Anemia -transfuse PRN for Hgb <7 -avoid IV Fe -Hgb stable 11.6  Discussed with ICU RN, CCM, and CTS.  Tracy Stank, MD [facility]  Subjective:   Patient seen and examined on CRRT. Now extubated. No acute events. Net neg ~2.3L Uop ~90cc CVP down to 6 this AM   Objective:   BP 101/67   Pulse (!) 101   Temp 99 F (37.2 C)   Resp 17   Ht 5' 4 (1.626 m)   Wt 71.8 kg   SpO2 94%   BMI 27.17 kg/m   Intake/Output Summary (Last 24 hours) at 02/19/2024 9247 Last data filed at 02/19/2024 0700 Gross per 24 hour  Intake 2526.07 ml  Output 4907.8 ml  Net -2381.73 ml   Weight change: -2.7 kg  Physical Exam: Gen: ill appearing, NAD CVS: RRR Resp: decreased breath sounds bibasilar Abd: soft Ext: trace dependent edema Neuro: awake, alert Dialysis access: LIJ  temp HD cath  Imaging: DG Chest Port 1 View Result Date: 02/19/2024 EXAM: 1 VIEW(S) XRAY OF THE CHEST 02/19/2024 05:34:00 AM COMPARISON: 02/16/2024 CLINICAL HISTORY: S/P MVR (mitral valve replacement) FINDINGS: LINES, TUBES AND DEVICES: Endotracheal tube removed. Feeding tube in place extending beyond the inferior aspect of the film. Left internal jugular line stable in place with tip at low SVC. Stable right PICC line. LUNGS AND PLEURA: Small right pleural effusion with minimal layering left pleural fluid. Improved right base aeration. Pulmonary interstitial thickening accentuated by portable technique. Persistent bibasilar atelectasis. No pneumothorax. HEART AND MEDIASTINUM: Median sternotomy and prosthetic valve noted. BONES AND SOFT TISSUES: No acute osseous abnormality. IMPRESSION: 1. Small right pleural effusion with minimal layering left pleural fluid. 2. Persistent bibasilar atelectasis with improved right base aeration. 3. Pulmonary interstitial thickening, likely accentuated by portable technique. Electronically signed by: Waddell Calk MD 02/19/2024 07:28 AM EST RP Workstation: [id number]   VAS US  LOWER EXTREMITY VENOUS (DVT) Result Date: 02/18/2024  Lower Venous DVT Study Patient Name:  Tracy Baker  Date of Exam:   02/18/2024 Medical Rec #: [id number]       Accession #:    [id number] Date of Birth: 1955/12/11       Patient Gender: F Patient Age:   68 years Exam Location:  [facility] Procedure:      VAS US  LOWER EXTREMITY VENOUS (DVT) Referring Phys: LEITA EINSTEIN --------------------------------------------------------------------------------  Indications: Edema.  Limitations: Poor ultrasound/tissue interface, body habitus and immobility. Comparison Study: No previous exams Performing  Technologist: Ezzie Potters RVT, RDMS  Examination Guidelines: A complete evaluation includes B-mode imaging, spectral Doppler, color Doppler, and power Doppler as needed of all accessible portions of each  vessel. Bilateral testing is considered an integral part of a complete examination. Limited examinations for reoccurring indications may be performed as noted. The reflux portion of the exam is performed with the patient in reverse Trendelenburg.  +---------+---------------+---------+-----------+----------+-------------------+ RIGHT    CompressibilityPhasicitySpontaneityPropertiesThrombus Aging      +---------+---------------+---------+-----------+----------+-------------------+ CFV      Full           Yes      Yes                                      +---------+---------------+---------+-----------+----------+-------------------+ SFJ      Full                                                             +---------+---------------+---------+-----------+----------+-------------------+ FV Prox                 Yes      Yes                  Not well visualized +---------+---------------+---------+-----------+----------+-------------------+ FV Mid   Full           Yes      Yes                                      +---------+---------------+---------+-----------+----------+-------------------+ FV DistalFull           Yes      Yes                                      +---------+---------------+---------+-----------+----------+-------------------+ PFV                     Yes      Yes                                      +---------+---------------+---------+-----------+----------+-------------------+ POP      Full           Yes      Yes                                      +---------+---------------+---------+-----------+----------+-------------------+ PTV      Full                                                             +---------+---------------+---------+-----------+----------+-------------------+ PERO     Full  Not well visualized +---------+---------------+---------+-----------+----------+-------------------+  Prox FV and PFV not well visualized due to bandage to medial proximal thigh. Patent by color/doppler only.  +---------+---------------+---------+-----------+----------+-------------------+ LEFT     CompressibilityPhasicitySpontaneityPropertiesThrombus Aging      +---------+---------------+---------+-----------+----------+-------------------+ CFV      Full           Yes      Yes                                      +---------+---------------+---------+-----------+----------+-------------------+ SFJ      Full                                                             +---------+---------------+---------+-----------+----------+-------------------+ FV Prox  Full           Yes      Yes                                      +---------+---------------+---------+-----------+----------+-------------------+ FV Mid   Full           Yes      Yes                                      +---------+---------------+---------+-----------+----------+-------------------+ FV DistalFull           Yes      Yes                                      +---------+---------------+---------+-----------+----------+-------------------+ PFV      Full                                                             +---------+---------------+---------+-----------+----------+-------------------+ POP      Full           Yes      Yes                                      +---------+---------------+---------+-----------+----------+-------------------+ PTV                                                   unable to visualize +---------+---------------+---------+-----------+----------+-------------------+ PERO                                                  unable to visualize +---------+---------------+---------+-----------+----------+-------------------+     Summary: BILATERAL: - No evidence of deep vein thrombosis seen in the lower  extremities, bilaterally. -No evidence of popliteal cyst,  bilaterally.   *See table(s) above for measurements and observations. Electronically signed by Fonda Rim on 02/18/2024 at 4:09:52 PM.    Final     Labs: BMET Recent Labs  Lab 02/16/24 0515 02/17/24 0423 02/17/24 1617 02/17/24 1814 02/18/24 0422 02/18/24 1658 02/19/24 0500  NA 144 141 130* 140 138 137 139  K 4.0 4.2 4.5 4.7 4.3 4.6 4.4  CL 107 101 97* 105 102 100 102  CO2 25 29 26 22 28 28 27   GLUCOSE 203* 185* 429* 181* 149* 103* 149*  BUN 102* 58* 41* 42* 30* 30* 24*  CREATININE 2.02* 1.12* 1.02* 1.00 0.82 0.81 0.83  CALCIUM  8.3* 8.4* 7.7* 8.4* 8.2* 8.4* 8.7*  PHOS 5.2* 2.7 2.5 2.7 2.4* 1.9* 3.8   CBC Recent Labs  Lab 02/17/24 0423 02/18/24 0422 02/18/24 1451 02/19/24 0524  WBC 12.1* 10.5 13.3* 12.1*  HGB 12.4 11.6* 11.8* 11.6*  HCT 39.3 36.6 37.3 36.7  MCV 100.0 99.5 100.3* 99.7  PLT 64* 53* 59* 58*    Medications:     feeding supplement (PROSource TF20)  60 mL Per Tube Daily   fiber supplement (BANATROL TF)  60 mL Per Tube BID   Gerhardt's butt cream   Topical BID   insulin  aspart  0-9 Units Subcutaneous Q4H   insulin  aspart  2 Units Subcutaneous Q4H   insulin  glargine  15 Units Subcutaneous Daily   mirtazapine   15 mg Oral QHS   mouth rinse  15 mL Mouth Rinse 4 times per day   pantoprazole  (PROTONIX ) IV  40 mg Intravenous QHS   polyethylene glycol  17 g Per Tube Daily   QUEtiapine   25 mg Per Tube BID   scopolamine   1 patch Transdermal Q72H   sodium chloride  flush  10-40 mL Intracatheter Q12H      Tracy Stank, MD [facility] 02/19/2024, 7:52 AM

## 2024-02-19 NOTE — Progress Notes (Incomplete)
 Fluc LOT

## 2024-02-19 NOTE — Progress Notes (Signed)
 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 Kerrin. PCCM consulted for ICU post-op vent management initially.  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 Kerrin 12/6: Overnight patient started requiring more vasopressor support, early morning she was on dobutamine  5, milrinone  0.375, epinephrine  at 10, vasopressin  0.04 and norepinephrine  34 mics, she is not making urine.  Cannulated with Protek duo for RV support,  12/7 bronch, broadened antibiotics 12/8 decannulated from VV ECMO after sweep and flow wean trials. Lasix  stopped. Restarted TF. 12/10 chest tubes out, off pressors. Aline removed. 12/13 started CRRT 12/15 extubated, weak, continued fevers remains on CRRT 12/16 CRRT off this AM, less delirious, LE dopplers neg  Interim History / Subjective:  No overnight Looks brighter today Lasix  100mg  now off CRRT  Starting amiodarone  30mg  gtt, remains on heparin   Less agitated this morning and fever curve improving   Objective    Blood pressure 101/67, pulse (!) 101, temperature 99 F (37.2 C), resp. rate 17, height 5' 4 (1.626 m), weight 71.8 kg, SpO2 94%. CVP:  [4 mmHg-13 mmHg] 13 mmHg      Intake/Output Summary (Last 24 hours) at 02/19/2024 0816 Last data filed at 02/19/2024  0700 Gross per 24 hour  Intake 2448.55 ml  Output 4763.7 ml  Net -2315.15 ml   Filed Weights   02/17/24 0430 02/18/24 0500 02/19/24 0500  Weight: 77.9 kg 74.5 kg 71.8 kg     General: Elderly ill-appearing female resting in bed in NAD  HEENT: MM pink/moist, sclera anicteric Neuro: Following commands, oriented to person and place  CV: s1s2 atrial fibrillation, no m/r/g PULM: Minimally diminished in the bilateral bases, weaning on PSV, minimal secretions GI: soft, nondistended Extremities: warm/dry, 1+ peripheral edema     93cc UOP yesterday  Coox 52% Platelets 58  Trach aspirate culture 12/13>rare enterobacter>pan-sensitive, Repeat 12/13 growing the same    Patient Lines/Drains/Airways Status     Active Line/Drains/Airways     Name Placement date Placement time Site Days   PICC Double Lumen 01/15/24 Right Basilic 35 cm 0 cm 01/15/24  8384  -- 35   Hemodialysis Catheter Left Internal jugular Triple lumen Temporary (Non-Tunneled) 02/16/24  1600  Internal jugular  3   Urethral Catheter Lauraine Holts, RN Temperature probe 14 Fr. 02/18/24  1422  Temperature probe  1   Small Bore Feeding Tube Left nare Marking at nare/corner of mouth 67 cm 02/15/24  1135  Left nare  4   Wound 01/16/24 0800 Traumatic Back Left;Upper 01/16/24  0800  Back  34   Wound 02/08/24 Surgical Closed Surgical Incision Sternum Mid 02/08/24  --  Sternum  11   Wound 02/12/24 1800 Pressure Injury Buttocks Right;Medial Deep Tissue Pressure Injury - Purple or maroon localized area of discolored intact skin or blood-filled blister due to damage of underlying  soft tissue from pressure and/or shear. 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 Pharmacy to discussing with ID  (they wanted to see her before going off antifungals as OP) -transition to Eliquis  tomorrow    Shock, mixed cardiogenic and distributive; continues to improve -cefepime  started 12/9, improving, consider d/c  -DBA -Fever curve improving, procal indeterminate  -wean norepi as able to maintain MAP >65  Acute respiratory failure with hypoxia RUL pneumonia- Enterobacter -passed SBT/SAT and extubated to [facility]  - Globally weak, failed swallow yesterday, TF held for nausea yesterday but resumed today - Encourage IS - Continue cefepime ,  AKI on CKD stage IIIa, Cr plateau, but BUN still rising -strict I/O -renally dose meds, avoid nephrotoxic meds - Off CRRT today, Lasix  100mg    Hypoglycemia improved; now mild hyperglycemia on TF H/o prediabetes, A1c 6 -SSI PRN - Continue 2 units q4h TF coverage and glargine 15 units daily -goal BG 140-180    Acute blood loss anemia status post multiple PRBC transfusion Thrombocytopenia due to critical illness status post 2 unit platelets Coagulopathy, probably from liver congestion requiring FFP transfusion -transfuse for Hb <7 or hemodynamically significant bleeding -with platelets slowly recovering will continue fixed dose heparin  gtt  Severe protein calorie malnutrition -Swallow evaluation, meanwhile continue TF at goal via cortrak -anticipate she will need supplemental nutrition for a while even once she is eating given her long duration of critical illness  Hyperbilirubinemia; unclear etiology, stable  -CMP in AM  Deconditioning, prolonged course of illness leading up to  this  -PT, OT -anticipate she will need CIR  Pressure ulcer on buttocks; skin breakdown from bandage on R neck -optimizing nutrition -Failed swallow after extubation  -turns -need to start mobilizing her as she can tolerate - Possible chlorhexidine  allergy, may have also been from adhesives - Wound care following  Left hand cool> resolved; has thrombus in L radial artery -fixed dose heparin , transition to Eliquis  tomorrow    Labs:  CBC: Recent Labs  Lab 02/16/24 0515 02/17/24 0423 02/18/24 0422 02/18/24 1451 02/19/24 0524  WBC 8.6 12.1* 10.5 13.3* 12.1*  HGB 12.2 12.4 11.6* 11.8* 11.6*  HCT 39.3 39.3 36.6 37.3 36.7  MCV 98.5 100.0 99.5 100.3* 99.7  PLT 61* 64* 53* 59* 58*    Basic Metabolic Panel: Recent Labs  Lab 02/15/24 0524 02/15/24 1600 02/16/24 0515 02/17/24 0423 02/17/24 1617 02/17/24 1814 02/18/24 0422 02/18/24 1658 02/19/24 0500 02/19/24 0524  NA 142   < > 144 141 130* 140 138 137 139  --   K 3.3*   < > 4.0 4.2 4.5 4.7 4.3 4.6 4.4  --   CL 106   < > 107 101 97* 105 102 100 102  --   CO2 24   < > 25 29 26 22 28 28 27   --  GLUCOSE 174*   < > 203* 185* 429* 181* 149* 103* 149*  --   BUN 97*   < > 102* 58* 41* 42* 30* 30* 24*  --   CREATININE 2.25*   < > 2.02* 1.12* 1.02* 1.00 0.82 0.81 0.83  --   CALCIUM  8.5*   < > 8.3* 8.4* 7.7* 8.4* 8.2* 8.4* 8.7*  --   MG 2.1  --  2.1 2.2  --   --  2.4  --   --  2.5*  PHOS 4.8*  --  5.2* 2.7 2.5 2.7 2.4* 1.9* 3.8  --    < > = values in this interval not displayed.   GFR: Estimated Creatinine Clearance: 63 mL/min (by C-G formula based on SCr of 0.83 mg/dL). Recent Labs  Lab 02/12/24 0822 02/12/24 1346 02/17/24 0423 02/18/24 0422 02/18/24 1451 02/19/24 0524  PROCALCITON  --   --   --   --  1.24  --   WBC  --    < > 12.1* 10.5 13.3* 12.1*  LATICACIDVEN 1.2  --   --   --   --   --    < > = values in this interval not displayed.    Liver Function Tests: Recent Labs  Lab 02/13/24 0339 02/14/24 0339  02/17/24 0423 02/17/24 1617 02/17/24 1814 02/18/24 0422 02/18/24 1658 02/19/24 0500  AST 18  --  27  --   --   --   --   --   ALT <5  --  12  --   --   --   --   --   ALKPHOS 105  --  209*  --   --   --   --   --   BILITOT 1.4*  --  1.3*  --   --   --   --   --   PROT 4.8*  --  5.9*  --   --   --   --   --   ALBUMIN  2.4*   < > 2.5*  2.5* 2.4* 2.6* 2.6* 2.6* 2.7*   < > = values in this interval not displayed.   ABG    Component Value Date/Time   PHART 7.340 (L) 02/13/2024 1618   PCO2ART 33.4 02/13/2024 1618   PO2ART 89 02/13/2024 1618   HCO3 18.0 (L) 02/13/2024 1618   TCO2 19 (L) 02/13/2024 1618   ACIDBASEDEF 7.0 (H) 02/13/2024 1618   O2SAT 52.5 02/19/2024 0530    Coagulation Profile: Recent Labs  Lab 02/13/24 0339  INR 1.2      CBG: Recent Labs  Lab 02/18/24 1538 02/18/24 1941 02/18/24 2347 02/19/24 0354 02/19/24 0723  GLUCAP 101* 146* 125* 132* 150*   This patient is critically ill with multiple organ system failure which requires frequent high CRITICAL CARE Performed by: Leita SAUNDERS Janely Gullickson   Total critical care time: 35 minutes  Critical care time was exclusive of separately billable procedures and treating other patients.  Critical care was necessary to treat or prevent imminent or life-threatening deterioration.  Critical care was time spent personally by me on the following activities: development of treatment plan with patient and/or surrogate as well as nursing, discussions with consultants, evaluation of patient's response to treatment, examination of patient, obtaining history from patient or surrogate, ordering and performing treatments and interventions, ordering and review of laboratory studies, ordering and review of radiographic studies, pulse oximetry and re-evaluation of patient's condition.   Leita SAUNDERS Keyaria Lawson, PA-C  [facility] See Amion for pager If no response to pager , please call [phone] until 7pm After 7:00 pm  call Elink  [phone]

## 2024-02-19 NOTE — Progress Notes (Signed)
 02/19/2024 Off CRRT Lasix  challenge Remains on dobutamine  Possible iHD tomorrow On amio  Rolan Sharps MD PCCM

## 2024-02-19 NOTE — Progress Notes (Signed)
 PHARMACY - ANTICOAGULATION CONSULT NOTE  Pharmacy Consult for heparin  Indication: atrial fibrillation  Allergies[1]  Patient Measurements: Height: 5' 4 (162.6 cm) Weight: 71.8 kg (158 lb 4.6 oz) IBW/kg (Calculated) : 54.7 HEPARIN  DW (KG): 66.5  Vital Signs: Temp: 99 F (37.2 C) (12/16 0645) Temp Source: Bladder (12/16 0400) BP: 101/67 (12/16 0645) Pulse Rate: 101 (12/16 0645)  Labs: Recent Labs    02/17/24 1814 02/18/24 0422 02/18/24 1451 02/18/24 1658 02/19/24 0500 02/19/24 0524  HGB  --  11.6* 11.8*  --   --  11.6*  HCT  --  36.6 37.3  --   --  36.7  PLT  --  53* 59*  --   --  58*  HEPARINUNFRC 0.11* 0.11*  --   --   --  0.10*  CREATININE 1.00 0.82  --  0.81 0.83  --     Estimated Creatinine Clearance: 63 mL/min (by C-G formula based on SCr of 0.83 mg/dL).   Medical History: Past Medical History:  Diagnosis Date   Thyroid  disease     Medications:  Scheduled:   feeding supplement (PROSource TF20)  60 mL Per Tube Daily   fiber supplement (BANATROL TF)  60 mL Per Tube BID   Gerhardt's butt cream   Topical BID   insulin  aspart  0-9 Units Subcutaneous Q4H   insulin  aspart  2 Units Subcutaneous Q4H   insulin  glargine  15 Units Subcutaneous Daily   mirtazapine   15 mg Oral QHS   mouth rinse  15 mL Mouth Rinse 4 times per day   pantoprazole  (PROTONIX ) IV  40 mg Intravenous QHS   polyethylene glycol  17 g Per Tube Daily   QUEtiapine   25 mg Per Tube BID   scopolamine   1 patch Transdermal Q72H   sodium chloride  flush  10-40 mL Intracatheter Q12H    Assessment: Tracy Baker is a 89 YOF now s/p MV repair and redo on 12/5. Hospitalization complicated by acute RV failure with cardiogenic shock s/p Protek duo VV ECMO cannula. Patient also with atrial fibrillation throughout this hospitalization. PLT have been low this week, slightly improving today (02/17/24). Discussed with team, plan to initiate low-dose heparin  infusion. Pharmacy consulted for heparin  dosing.    02/19/24: Heparin  level 0.10 below goal as expected on 500 units/h.  Hgb 11.6, pltc 58 (stable, 64 pre-heparin ).  No s/sx bleeding per RN or infusion issues.  Stopping CRRT today, plan for lasix  challenge then tentative iHD.  Per discussion with HF, OK to start titrating heparin  slowly.  Will need Eliquis  eventually pending [facility] plans.   Goal of Therapy:  Heparin  level 0.3-0.5 Monitor platelets by anticoagulation protocol: Yes   Plan:  Increase heparin  IV to 650 units/hr  8h heparin  level Monitor heparin  level, CBC, and s/sx of bleeding daily  Maurilio Fila, PharmD Clinical Pharmacist 02/19/2024  7:53 AM      [1]  Allergies Allergen Reactions   Codeine Anaphylaxis and Nausea And Vomiting   Bactoshield Chg [Chlorhexidine  Gluconate] Dermatitis   Chlorhexidine  Dermatitis and Rash   Red Dye #40 (Allura Red) Nausea Only

## 2024-02-19 NOTE — Progress Notes (Signed)
 PHARMACY - ANTICOAGULATION CONSULT NOTE  Pharmacy Consult for heparin  Indication: atrial fibrillation  Allergies[1]  Patient Measurements: Height: 5' 4 (162.6 cm) Weight: 71.8 kg (158 lb 4.6 oz) IBW/kg (Calculated) : 54.7 HEPARIN  DW (KG): 66.5  Vital Signs: Temp: 99.3 F (37.4 C) (12/16 1700) Temp Source: Bladder (12/16 1200) BP: 124/54 (12/16 1700) Pulse Rate: 80 (12/16 1700)  Labs: Recent Labs    02/18/24 0422 02/18/24 1451 02/18/24 1658 02/19/24 0500 02/19/24 0524 02/19/24 1654  HGB 11.6* 11.8*  --   --  11.6*  --   HCT 36.6 37.3  --   --  36.7  --   PLT 53* 59*  --   --  58*  --   HEPARINUNFRC 0.11*  --   --   --  0.10* 0.12*  CREATININE 0.82  --  0.81 0.83  --  1.25*    Estimated Creatinine Clearance: 41.8 mL/min (A) (by C-G formula based on SCr of 1.25 mg/dL (H)).   Medical History: Past Medical History:  Diagnosis Date   Thyroid  disease     Medications:  Scheduled:   feeding supplement (PROSource TF20)  60 mL Per Tube Daily   fiber supplement (BANATROL TF)  60 mL Per Tube BID   Gerhardt's butt cream   Topical BID   insulin  aspart  0-9 Units Subcutaneous Q4H   insulin  aspart  2 Units Subcutaneous Q4H   insulin  glargine  15 Units Subcutaneous Daily   mirtazapine   15 mg Per Tube QHS   mouth rinse  15 mL Mouth Rinse 4 times per day   pantoprazole  (PROTONIX ) IV  40 mg Intravenous QHS   polyethylene glycol  17 g Per Tube Daily   QUEtiapine   25 mg Per Tube BID   scopolamine   1 patch Transdermal Q72H   sodium chloride  flush  10-40 mL Intracatheter Q12H    Assessment: Tracy Baker is a 12 YOF now s/p MV repair and redo on 12/5. Hospitalization complicated by acute RV failure with cardiogenic shock s/p Protek duo VV ECMO cannula. Patient also with atrial fibrillation throughout this hospitalization. PLT have been low this week, slightly improving today (02/17/24). Discussed with team, plan to initiate low-dose heparin  infusion. Pharmacy consulted for  heparin  dosing.   02/19/24: Heparin  level 0.10 below goal as expected on 500 units/h.  Hgb 11.6, pltc 58 (stable, 64 pre-heparin ).  No s/sx bleeding per RN or infusion issues.  Stopping CRRT today, plan for lasix  challenge then tentative iHD.  Per discussion with HF, OK to start titrating heparin  slowly.  Will need Eliquis  eventually pending [facility] plans.   PM update: HL 0.12 on 650 units/hr. No new issues per RN.  Goal of Therapy:  Heparin  level 0.3-0.5 Monitor platelets by anticoagulation protocol: Yes   Plan:  Increase heparin  IV to 750 units/hr  8h heparin  level Monitor heparin  level, CBC, and s/sx of bleeding daily  Tracy Baker, PharmD, BCPS Clinical Pharmacist 02/19/2024  6:53 PM       [1]  Allergies Allergen Reactions   Codeine Anaphylaxis and Nausea And Vomiting   Bactoshield Chg [Chlorhexidine  Gluconate] Dermatitis   Chlorhexidine  Dermatitis and Rash   Red Dye #40 (Allura Red) Nausea Only

## 2024-02-19 NOTE — Progress Notes (Signed)
 Speech Language Pathology Treatment: Dysphagia  Patient Details Name: Tracy Baker MRN: [id number] DOB: [birth year] Today's Date: 02/19/2024 Time: 8983-8960 SLP Time Calculation (min) (ACUTE ONLY): 23 min  Assessment / Plan / Recommendation Clinical Impression  Pt's mentation is improved this AM and CRRT has been discontinued. She independently completed oral care and fed herself with assistance. Her vocal quality remains low in intensity and her cough resembles huffing. Coughing follows ice chips and thin liquids and is not productive of secretions. She took larger bites of applesauce today with prompt oral transit and no coughing or throat clearance. Discussed need to proceed with instrumental testing in light of prolonged intubation and profound weakness. In the interim, she can start having ice chips in moderation with frequent oral care.   The purpose of ice for this pt is not only comfort, but to keep pharynx healthy and moist. Pooled secretions in an NPO pt can thicken and create harmful persistent secretions that pt can't mobilize. These can even form mucus plugs. A few ice chips, given after oral care, can loosen secretions for pt to clear and maintain airway hygiene. Also can preserve some swallow function. Minimal ice is not expected to be harmful to patient even if some moisture is aspirated. Cued coughs after trials also will help airway protection. Encourage RN to continue these. Posted a sign for guidance.   HPI HPI: 68 yo female admitted 10/25 after leaving AMA from [facility] where she was being treated for C. Albicans candidemia. TEE with mitral valve vegetation 10/28. Dental extractions 11/3. Mitral valve repair, ETT <24 hrs 11/6-11/7 (passed [facility] post-extubation). Redo sternotomy with MVR 12/5, developed R ventricular failure 12/6, requiring ECMO cannulation 12/6-12/8. Remained intubated post-operatively 12/5-12/15. CRRT 12/13-12/16. PMH includes thyroid  disease      SLP Plan   Continue with current plan of care        Swallow Evaluation Recommendations   Recommendations: NPO;Ice chips PRN after oral care Medication Administration: Via alternative means Oral care recommendations: Oral care QID (4x/day);Oral care before ice chips/water      Recommendations                     Oral care QID;Oral care prior to ice chip/H20   Frequent or constant Supervision/Assistance Dysphagia, unspecified (R13.10)     Continue with current plan of care     Damien Blumenthal, M.A., CCC-SLP Speech Language Pathology, Acute Rehabilitation Services  Secure Chat preferred [phone]   02/19/2024, 11:20 AM

## 2024-02-20 ENCOUNTER — Encounter (HOSPITAL_COMMUNITY): Payer: Self-pay | Admitting: Thoracic Surgery (Cardiothoracic Vascular Surgery)

## 2024-02-20 ENCOUNTER — Inpatient Hospital Stay (HOSPITAL_COMMUNITY)

## 2024-02-20 DIAGNOSIS — I059 Rheumatic mitral valve disease, unspecified: Secondary | ICD-10-CM | POA: Diagnosis not present

## 2024-02-20 DIAGNOSIS — J9601 Acute respiratory failure with hypoxia: Secondary | ICD-10-CM | POA: Diagnosis not present

## 2024-02-20 DIAGNOSIS — B377 Candidal sepsis: Secondary | ICD-10-CM | POA: Diagnosis not present

## 2024-02-20 DIAGNOSIS — N183 Chronic kidney disease, stage 3 unspecified: Secondary | ICD-10-CM | POA: Diagnosis not present

## 2024-02-20 DIAGNOSIS — I50811 Acute right heart failure: Secondary | ICD-10-CM | POA: Diagnosis not present

## 2024-02-20 DIAGNOSIS — N179 Acute kidney failure, unspecified: Secondary | ICD-10-CM | POA: Diagnosis not present

## 2024-02-20 LAB — COMPREHENSIVE METABOLIC PANEL WITH GFR
ALT: 20 U/L (ref 0–44)
AST: 33 U/L (ref 15–41)
Albumin: 3.3 g/dL — ABNORMAL LOW (ref 3.5–5.0)
Alkaline Phosphatase: 273 U/L — ABNORMAL HIGH (ref 38–126)
Anion gap: 10 (ref 5–15)
BUN: 51 mg/dL — ABNORMAL HIGH (ref 8–23)
CO2: 25 mmol/L (ref 22–32)
Calcium: 8.9 mg/dL (ref 8.9–10.3)
Chloride: 103 mmol/L (ref 98–111)
Creatinine, Ser: 1.58 mg/dL — ABNORMAL HIGH (ref 0.44–1.00)
GFR, Estimated: 35 mL/min — ABNORMAL LOW (ref >60)
Glucose, Bld: 108 mg/dL — ABNORMAL HIGH (ref 70–99)
Potassium: 4.9 mmol/L (ref 3.5–5.1)
Sodium: 138 mmol/L (ref 135–145)
Total Bilirubin: 1.1 mg/dL (ref 0.0–1.2)
Total Protein: 5.7 g/dL — ABNORMAL LOW (ref 6.5–8.1)

## 2024-02-20 LAB — GLUCOSE, CAPILLARY
Glucose-Capillary: 101 mg/dL — ABNORMAL HIGH (ref 70–99)
Glucose-Capillary: 118 mg/dL — ABNORMAL HIGH (ref 70–99)
Glucose-Capillary: 133 mg/dL — ABNORMAL HIGH (ref 70–99)
Glucose-Capillary: 147 mg/dL — ABNORMAL HIGH (ref 70–99)
Glucose-Capillary: 157 mg/dL — ABNORMAL HIGH (ref 70–99)
Glucose-Capillary: 159 mg/dL — ABNORMAL HIGH (ref 70–99)
Glucose-Capillary: 187 mg/dL — ABNORMAL HIGH (ref 70–99)

## 2024-02-20 LAB — CBC
HCT: 34.4 % — ABNORMAL LOW (ref 36.0–46.0)
Hemoglobin: 10.9 g/dL — ABNORMAL LOW (ref 12.0–15.0)
MCH: 31.5 pg (ref 26.0–34.0)
MCHC: 31.7 g/dL (ref 30.0–36.0)
MCV: 99.4 fL (ref 80.0–100.0)
Platelets: 67 K/uL — ABNORMAL LOW (ref 150–400)
RBC: 3.46 MIL/uL — ABNORMAL LOW (ref 3.87–5.11)
RDW: 20.3 % — ABNORMAL HIGH (ref 11.5–15.5)
WBC: 10.4 K/uL (ref 4.0–10.5)
nRBC: 0 % (ref 0.0–0.2)

## 2024-02-20 LAB — MAGNESIUM: Magnesium: 2.5 mg/dL — ABNORMAL HIGH (ref 1.7–2.4)

## 2024-02-20 LAB — COOXEMETRY PANEL
Carboxyhemoglobin: 1.9 % — ABNORMAL HIGH (ref 0.5–1.5)
Carboxyhemoglobin: 1.5 % (ref 0.5–1.5)
Methemoglobin: 0.7 % (ref 0.0–1.5)
Methemoglobin: 0.7 % (ref 0.0–1.5)
O2 Saturation: 54.6 %
O2 Saturation: 51 %
Total hemoglobin: 11.3 g/dL — ABNORMAL LOW (ref 12.0–16.0)
Total hemoglobin: 11.2 g/dL — ABNORMAL LOW (ref 12.0–16.0)

## 2024-02-20 LAB — HEPARIN LEVEL (UNFRACTIONATED)
Heparin Unfractionated: 0.17 [IU]/mL — ABNORMAL LOW (ref 0.30–0.70)
Heparin Unfractionated: 0.27 [IU]/mL — ABNORMAL LOW (ref 0.30–0.70)
Heparin Unfractionated: 0.27 [IU]/mL — ABNORMAL LOW (ref 0.30–0.70)

## 2024-02-20 LAB — PHOSPHORUS: Phosphorus: 4 mg/dL (ref 2.5–4.6)

## 2024-02-20 MED ORDER — CLONAZEPAM 0.5 MG PO TABS
0.5000 mg | ORAL_TABLET | Freq: Every day | ORAL | Status: DC
  Administered 2024-02-20 – 2024-03-04 (×13): 0.5 mg
  Filled 2024-02-20 (×11): qty 1

## 2024-02-20 MED ORDER — SIMETHICONE 80 MG PO CHEW
80.0000 mg | CHEWABLE_TABLET | Freq: Four times a day (QID) | ORAL | Status: DC | PRN
  Administered 2024-02-20: 11:00:00 80 mg
  Filled 2024-02-20: qty 1

## 2024-02-20 MED ORDER — POLYETHYLENE GLYCOL 3350 17 G PO PACK: 17.0000 g | PACK | Freq: Every day | ORAL | Status: DC | PRN

## 2024-02-20 MED ORDER — FUROSEMIDE 10 MG/ML IJ SOLN
120.0000 mg | Freq: Two times a day (BID) | INTRAVENOUS | Status: AC
  Administered 2024-02-20 (×2): 120 mg via INTRAVENOUS
  Filled 2024-02-20: qty 120
  Filled 2024-02-20: qty 10

## 2024-02-20 NOTE — Progress Notes (Addendum)
 [facility] Progress Note    Assessment/ Plan:   AKI -b/l Cr 0.4-0.8. 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 CRRT started 12/13 to help offload volume and given encephalopathy/uremia. Was unable to wean from ventilator. Now extubated 12/15. Stopped CRRT 12/16. Failed lasix  challenge 12/16. -plan for HD today, will continue to monitor for further renal replacement needs/renal recovery -Avoid nephrotoxic medications including NSAIDs and iodinated intravenous contrast exposure unless the latter is absolutely indicated.  Preferred narcotic agents for pain control are hydromorphone , fentanyl , and methadone . Morphine should not be used. Avoid Baclofen and avoid oral sodium phosphate  and magnesium  citrate based laxatives / bowel preps. Continue strict Input and Output monitoring. 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 On cefepime , fluconazole ,  -on dobuatmine -UF as tolerated  AHRF -extubated on 12/15 -UF as tolerated w/ HD  Anemia -transfuse PRN for Hgb <7 -avoid IV Fe -Hgb 10.9  Discussed with ICU RN, CCM.  [name], MD [facility]  Addendum: discussed with CCM, holding off on HD for today. Discussed with KDU.  Subjective:   Patient seen and examined in room. S/p lasix  100mg  IV X 1 dose--uop only around 0.4L. no complaints currently   Objective:   BP (!) 130/58   Pulse 77   Temp 98.8 F (37.1 C)   Resp 19   Ht 5' 4 (1.626 m)   Wt 73.5 kg   SpO2 93%   BMI 27.81 kg/m   Intake/Output Summary (Last 24 hours) at 02/20/2024 9193 Last data filed at 02/20/2024 9377 Gross per 24 hour  Intake 2276.44 ml  Output 440 ml  Net 1836.44 ml   Weight change: 1.7 kg  Physical Exam: Gen: ill/tired appearing, NAD CVS: RRR Resp: decreased  breath sounds bibasilar, normal appearing Abd: soft Ext: trace edema b/l LEs Neuro: awake, alert Dialysis access: LIJ temp HD cath  Imaging: DG Chest Port 1 View Result Date: 02/19/2024 EXAM: 1 VIEW(S) XRAY OF THE CHEST 02/19/2024 05:34:00 AM COMPARISON: 02/16/2024 CLINICAL HISTORY: S/P MVR (mitral valve replacement) FINDINGS: LINES, TUBES AND DEVICES: Endotracheal tube removed. Feeding tube in place extending beyond the inferior aspect of the film. Left internal jugular line stable in place with tip at low SVC. Stable right PICC line. LUNGS AND PLEURA: Small right pleural effusion with minimal layering left pleural fluid. Improved right base aeration. Pulmonary interstitial thickening accentuated by portable technique. Persistent bibasilar atelectasis. No pneumothorax. HEART AND MEDIASTINUM: Median sternotomy and prosthetic valve noted. BONES AND SOFT TISSUES: No acute osseous abnormality. IMPRESSION: 1. Small right pleural effusion with minimal layering left pleural fluid. 2. Persistent bibasilar atelectasis with improved right base aeration. 3. Pulmonary interstitial thickening, likely accentuated by portable technique. Electronically signed by: Waddell Calk MD 02/19/2024 07:28 AM EST RP Workstation: [id number]   VAS US  LOWER EXTREMITY VENOUS (DVT) Result Date: 02/18/2024  Lower Venous DVT Study Patient Name:  Tracy Baker  Date of Exam:   02/18/2024 Medical Rec #: [id number]       Accession #:    [id number] Date of Birth: [birth year]       Patient Gender: F Patient Age:   68 years Exam Location:  [facility] Procedure:      VAS US  LOWER EXTREMITY VENOUS (DVT) Referring Phys: LEITA EINSTEIN --------------------------------------------------------------------------------  Indications: Edema.  Limitations:  Poor ultrasound/tissue interface, body habitus and immobility. Comparison Study: No previous exams Performing Technologist: Jody Hill RVT, RDMS  Examination Guidelines: A complete evaluation  includes B-mode imaging, spectral Doppler, color Doppler, and power Doppler as needed of all accessible portions of each vessel. Bilateral testing is considered an integral part of a complete examination. Limited examinations for reoccurring indications may be performed as noted. The reflux portion of the exam is performed with the patient in reverse Trendelenburg.  +---------+---------------+---------+-----------+----------+-------------------+ RIGHT    CompressibilityPhasicitySpontaneityPropertiesThrombus Aging      +---------+---------------+---------+-----------+----------+-------------------+ CFV      Full           Yes      Yes                                      +---------+---------------+---------+-----------+----------+-------------------+ SFJ      Full                                                             +---------+---------------+---------+-----------+----------+-------------------+ FV Prox                 Yes      Yes                  Not well visualized +---------+---------------+---------+-----------+----------+-------------------+ FV Mid   Full           Yes      Yes                                      +---------+---------------+---------+-----------+----------+-------------------+ FV DistalFull           Yes      Yes                                      +---------+---------------+---------+-----------+----------+-------------------+ PFV                     Yes      Yes                                      +---------+---------------+---------+-----------+----------+-------------------+ POP      Full           Yes      Yes                                      +---------+---------------+---------+-----------+----------+-------------------+ PTV      Full                                                             +---------+---------------+---------+-----------+----------+-------------------+ PERO     Full  Not well visualized +---------+---------------+---------+-----------+----------+-------------------+ Prox FV and PFV not well visualized due to bandage to medial proximal thigh. Patent by color/doppler only.  +---------+---------------+---------+-----------+----------+-------------------+ LEFT     CompressibilityPhasicitySpontaneityPropertiesThrombus Aging      +---------+---------------+---------+-----------+----------+-------------------+ CFV      Full           Yes      Yes                                      +---------+---------------+---------+-----------+----------+-------------------+ SFJ      Full                                                             +---------+---------------+---------+-----------+----------+-------------------+ FV Prox  Full           Yes      Yes                                      +---------+---------------+---------+-----------+----------+-------------------+ FV Mid   Full           Yes      Yes                                      +---------+---------------+---------+-----------+----------+-------------------+ FV DistalFull           Yes      Yes                                      +---------+---------------+---------+-----------+----------+-------------------+ PFV      Full                                                             +---------+---------------+---------+-----------+----------+-------------------+ POP      Full           Yes      Yes                                      +---------+---------------+---------+-----------+----------+-------------------+ PTV                                                   unable to visualize +---------+---------------+---------+-----------+----------+-------------------+ PERO                                                  unable to visualize +---------+---------------+---------+-----------+----------+-------------------+     Summary:  BILATERAL: - No evidence of deep vein thrombosis seen in the lower  extremities, bilaterally. -No evidence of popliteal cyst, bilaterally.   *See table(s) above for measurements and observations. Electronically signed by Fonda Rim on 02/18/2024 at 4:09:52 PM.    Final     Labs: BMET Recent Labs  Lab 02/17/24 1617 02/17/24 1814 02/18/24 0422 02/18/24 1658 02/19/24 0500 02/19/24 1654 02/20/24 0400  NA 130* 140 138 137 139 138 138  K 4.5 4.7 4.3 4.6 4.4 4.8 4.9  CL 97* 105 102 100 102 102 103  CO2 26 22 28 28 27 26 25   GLUCOSE 429* 181* 149* 103* 149* 100* 108*  BUN 41* 42* 30* 30* 24* 40* 51*  CREATININE 1.02* 1.00 0.82 0.81 0.83 1.25* 1.58*  CALCIUM  7.7* 8.4* 8.2* 8.4* 8.7* 8.9 8.9  PHOS 2.5 2.7 2.4* 1.9* 3.8 3.4 4.0   CBC Recent Labs  Lab 02/18/24 0422 02/18/24 1451 02/19/24 0524 02/20/24 0400  WBC 10.5 13.3* 12.1* 10.4  HGB 11.6* 11.8* 11.6* 10.9*  HCT 36.6 37.3 36.7 34.4*  MCV 99.5 100.3* 99.7 99.4  PLT 53* 59* 58* 67*    Medications:     feeding supplement (PROSource TF20)  60 mL Per Tube Daily   fiber supplement (BANATROL TF)  60 mL Per Tube BID   Gerhardt's butt cream   Topical BID   insulin  aspart  0-9 Units Subcutaneous Q4H   insulin  aspart  2 Units Subcutaneous Q4H   insulin  glargine  15 Units Subcutaneous Daily   mirtazapine   15 mg Per Tube QHS   mouth rinse  15 mL Mouth Rinse 4 times per day   pantoprazole  (PROTONIX ) IV  40 mg Intravenous QHS   polyethylene glycol  17 g Per Tube Daily   QUEtiapine   25 mg Per Tube BID   scopolamine   1 patch Transdermal Q72H   sodium chloride  flush  10-40 mL Intracatheter Q12H      [name], MD [facility] 02/20/2024, 8:06 AM

## 2024-02-20 NOTE — Progress Notes (Signed)
 PHARMACY - ANTICOAGULATION CONSULT NOTE  Pharmacy Consult for heparin  Indication: atrial fibrillation  Allergies[1]  Patient Measurements: Height: 5' 4 (162.6 cm) Weight: 71.8 kg (158 lb 4.6 oz) IBW/kg (Calculated) : 54.7 HEPARIN  DW (KG): 66.5  Vital Signs: Temp: 98.8 F (37.1 C) (12/17 0500) Temp Source: Bladder (12/17 0015) BP: 109/50 (12/17 0500) Pulse Rate: 89 (12/17 0500)  Labs: Recent Labs    02/18/24 1451 02/18/24 1658 02/19/24 0500 02/19/24 0524 02/19/24 1654 02/20/24 0400  HGB 11.8*  --   --  11.6*  --  10.9*  HCT 37.3  --   --  36.7  --  34.4*  PLT 59*  --   --  58*  --  67*  HEPARINUNFRC  --   --   --  0.10* 0.12* 0.17*  CREATININE  --  0.81 0.83  --  1.25*  --     Estimated Creatinine Clearance: 41.8 mL/min (A) (by C-G formula based on SCr of 1.25 mg/dL (H)).   Medical History: Past Medical History:  Diagnosis Date   Thyroid  disease     Medications:  Scheduled:   feeding supplement (PROSource TF20)  60 mL Per Tube Daily   fiber supplement (BANATROL TF)  60 mL Per Tube BID   Gerhardt's butt cream   Topical BID   insulin  aspart  0-9 Units Subcutaneous Q4H   insulin  aspart  2 Units Subcutaneous Q4H   insulin  glargine  15 Units Subcutaneous Daily   mirtazapine   15 mg Per Tube QHS   mouth rinse  15 mL Mouth Rinse 4 times per day   pantoprazole  (PROTONIX ) IV  40 mg Intravenous QHS   polyethylene glycol  17 g Per Tube Daily   QUEtiapine   25 mg Per Tube BID   scopolamine   1 patch Transdermal Q72H   sodium chloride  flush  10-40 mL Intracatheter Q12H    Assessment: [name] is a 37 YOF now s/p MV repair and redo on 12/5. Hospitalization complicated by acute RV failure with cardiogenic shock s/p Protek duo VV ECMO cannula. Patient also with atrial fibrillation throughout this hospitalization. PLT have been low this week, slightly improving today (02/17/24). Discussed with team, plan to initiate low-dose heparin  infusion. Pharmacy consulted for  heparin  dosing.   02/19/24: Heparin  level 0.10 below goal as expected on 500 units/h.  Hgb 11.6, pltc 58 (stable, 64 pre-heparin ).  No s/sx bleeding per RN or infusion issues.  Stopping CRRT today, plan for lasix  challenge then tentative iHD.  Per discussion with HF, OK to start titrating heparin  slowly.  Will need Eliquis  eventually pending [facility] plans.   12/17 AM update:  Heparin  level sub-therapeutic Plts low but stable  Goal of Therapy:  Heparin  level 0.3-0.5 Monitor platelets by anticoagulation protocol: Yes   Plan:  Increase heparin  IV to 850 units/hr  8h heparin  level Monitor heparin  level, CBC, and s/sx of bleeding daily  Lynwood Mckusick, PharmD, BCPS Clinical Pharmacist Phone: [phone]        [1]  Allergies Allergen Reactions   Codeine Anaphylaxis and Nausea And Vomiting   Bactoshield Chg [Chlorhexidine  Gluconate] Dermatitis   Chlorhexidine  Dermatitis and Rash   Red Dye #40 (Allura Red) Nausea Only

## 2024-02-20 NOTE — TOC Progression Note (Addendum)
 Transition of Care [facility]) - Progression Note    Patient Details  Name: Ione Sandusky MRN: [id number] Date of Birth: Apr 21, 1955  [facility] of [facility]) CM/SW Contact  Arlana JINNY Nicholaus [place] Phone Number: [phone] 02/20/2024, 11:38 AM  Clinical Narrative:   HF CSW  completed the patients pasrr, FL2, faxed out the patient to SNF- offers pending. Will present to patient and daughter, [name] with accepted bed offers list once it updates.   HF CSW/CM will continue to follow and monitor for dc readiness.     Expected Discharge Plan: Skilled Nursing Facility Barriers to Discharge: Continued Medical Work up               Expected Discharge Plan and Services   Discharge Planning Services: CM Consult Post Acute Care Choice: Skilled Nursing Facility Living arrangements for the past 2 months: Apartment                                       Social Drivers of Health (SDOH) Interventions SDOH Screenings   Food Insecurity: No Food Insecurity (12/29/2023)  Housing: Low Risk (12/29/2023)  Transportation Needs: No Transportation Needs (12/29/2023)  Utilities: Not At Risk (12/29/2023)  Alcohol Screen: Low Risk (06/08/2022)  Depression (PHQ2-9): Low Risk (11/08/2023)  Social Connections: Socially Isolated (12/29/2023)  Tobacco Use: High Risk (02/08/2024)    Readmission Risk Interventions     No data to display

## 2024-02-20 NOTE — Progress Notes (Signed)
 Patient gets extremely dizzy when lowering HOB too fast as well as when turning to left side. Patient states it triggers her vertigo. Pt had nausea and dry heaving, PRN meds given with relief, see MAR.

## 2024-02-20 NOTE — Plan of Care (Signed)
°  Problem: Education: Goal: Knowledge of General Education information will improve Description: Including pain rating scale, medication(s)/side effects and non-pharmacologic comfort measures Outcome: Progressing    Problem: Clinical Measurements: Goal: Diagnostic test results will improve Outcome: Progressing Goal: Respiratory complications will improve Outcome: Progressing   Problem: Activity: Goal: Risk for activity intolerance will decrease Outcome: Progressing   Problem: Nutrition: Goal: Adequate nutrition will be maintained Outcome: Progressing   Problem: Elimination: Goal: Will not experience complications related to bowel motility Outcome: Progressing   Problem: Pain Managment: Goal: General experience of comfort will improve and/or be controlled Outcome: Progressing   Problem: Safety: Goal: Ability to remain free from injury will improve Outcome: Progressing

## 2024-02-20 NOTE — Progress Notes (Signed)
 Physical Therapy Treatment Patient Details Name: Tracy Baker MRN: [id number] DOB: 1955-10-08 Today's Date: 02/20/2024   History of Present Illness 68 y.o. female adm 12/29/2023 after leaving AMA from [facility] (where she was on vacation due to lack of formal transfer) where she was being treated for C. Albicans candidemia. 10/28 TEE with mitral valve vegetation. 11/3 teeth Extractions. 11/6 Mitral valve repair. 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 Extubated 12/15. PMHx: thyroid  disease.    PT Comments  The pt is making good functional progress as she is demonstrating improved initiation with mobility. However, she remains extremely weak, particularly in her legs, requiring maxA to flex a leg while supine to place it in preparation to roll and maxA to move legs off the EOB to sit up. She is also needed modAx2 to transfer to stand and maxA to pivot to a chair. She was unable to lift her legs to take steps when cued and her knees were noted to progressively flex the longer she stood. She is at high risk for falls and has had a drastic functional decline and therefore could greatly benefit from intensive inpatient rehab, > 3 hours/day. Directed pt through seated lower extremity A/AAROM exercises to address her noted quads and hip flexor weakness this date. Will continue to follow acutely.    If plan is discharge home, recommend the following: Assistance with cooking/housework;Assist for transportation;Help with stairs or ramp for entrance;Two people to help with walking and/or transfers;Two people to help with bathing/dressing/bathroom;Assistance with feeding;Direct supervision/assist for medications management;Direct supervision/assist for financial management   Can travel by private vehicle        Equipment Recommendations  Other (comment) (TBD)    Recommendations for Other Services Rehab consult     Precautions /  Restrictions Precautions Precautions: Fall;Sternal Precaution Booklet Issued: No Recall of Precautions/Restrictions: Impaired Precaution/Restrictions Comments: temp pacer, cortrak, foley Restrictions Weight Bearing Restrictions Per Provider Order: No Other Position/Activity Restrictions: Cardiac sternal precautions     Mobility  Bed Mobility Overal bed mobility: Needs Assistance Bed Mobility: Rolling, Sidelying to Sit Rolling: Max assist Sidelying to sit: Max assist, +2 for physical assistance, +2 for safety/equipment, HOB elevated       General bed mobility comments: Cues provided to flex R knee to push through R foot on bed to roll to L, maxA to rotate trunk. Cues provided to bring legs off L EOB and use core to sit up, needing maxAx2 to move legs and trunk to sit up    Transfers Overall transfer level: Needs assistance Equipment used: 1 person hand held assist Transfers: Sit to/from Stand, Bed to chair/wheelchair/BSC Sit to Stand: Mod assist, +2 physical assistance, +2 safety/equipment Stand pivot transfers: Max assist, +2 safety/equipment         General transfer comment: Bil knees blocked and pt cued to lean trunk anteriorly towards therapist to shift weight anteriorly to stand, modAx2 needed to power up to stand. Pt unable to lift legs to take steps, needing maxA, +2 for safety, to pivot on feet to transfer to L from bed to chair.    Ambulation/Gait               General Gait Details: unable to lift leg to take steps when cued this date   Stairs             Wheelchair Mobility     Tilt Bed    Modified Rankin (Stroke Patients Only)  Balance Overall balance assessment: Needs assistance Sitting-balance support: No upper extremity supported, Feet unsupported Sitting balance-Leahy Scale: Poor Sitting balance - Comments: Pt needing modA initially and progressing to CGA-minA for static sitting balance at EOB   Standing balance support:  Bilateral upper extremity supported, During functional activity Standing balance-Leahy Scale: Poor Standing balance comment: UE support and modAx1-2 to stand statically                            Communication Communication Communication: Impaired Factors Affecting Communication:  (soft spoken)  Cognition Arousal: Alert Behavior During Therapy: Flat affect   PT - Cognitive impairments: Awareness, Attention, Memory, Initiation, Sequencing, Problem solving, Safety/Judgement                       PT - Cognition Comments: Pt with delayed processing and did not recall education of goal to sit up in chair for at least an hour, immediately requesting to return to bed after being transferred to chair. Poor initiation and sequencing noted Following commands: Impaired Following commands impaired: Only follows one step commands consistently, Follows one step commands with increased time    Cueing Cueing Techniques: Verbal cues, Tactile cues, Gestural cues  Exercises General Exercises - Lower Extremity Long Arc Quad: AROM, Strengthening, Both, 10 reps, Seated Hip Flexion/Marching: AAROM, Strengthening, Both, 10 reps, Seated    General Comments General comments (skin integrity, edema, etc.): VSS, nystagmus noted with turn to L in bed, reports chronic vertigo, denied lightheadedness with positional changes this date      Pertinent Vitals/Pain Pain Assessment Pain Assessment: Faces Faces Pain Scale: Hurts a little bit Pain Location: generalized with mobility Pain Descriptors / Indicators: Discomfort, Grimacing, Guarding Pain Intervention(s): Monitored during session, Limited activity within patient's tolerance, Repositioned    Home Living                          Prior Function            PT Goals (current goals can now be found in the care plan section) Acute Rehab PT Goals Patient Stated Goal: agreeable to session, did not state a goal PT Goal  Formulation: With patient Time For Goal Achievement: 02/27/24 Potential to Achieve Goals: Fair Progress towards PT goals: Progressing toward goals    Frequency    Min 3X/week      PT Plan      Co-evaluation              AM-PAC PT 6 Clicks Mobility   Outcome Measure  Help needed turning from your back to your side while in a flat bed without using bedrails?: A Lot Help needed moving from lying on your back to sitting on the side of a flat bed without using bedrails?: Total Help needed moving to and from a bed to a chair (including a wheelchair)?: A Lot Help needed standing up from a chair using your arms (e.g., wheelchair or bedside chair)?: Total Help needed to walk in hospital room?: Total Help needed climbing 3-5 steps with a railing? : Total 6 Click Score: 8    End of Session Equipment Utilized During Treatment: Oxygen Activity Tolerance: Patient limited by fatigue;Patient tolerated treatment well Patient left: with call bell/phone within reach;with nursing/sitter in room;in chair;with family/visitor present;Other (comment) (no chair alarm needed per RN; hoyer lift pad under pt) Nurse Communication: Mobility status PT Visit Diagnosis: Other abnormalities  of gait and mobility (R26.89);Muscle weakness (generalized) (M62.81);Difficulty in walking, not elsewhere classified (R26.2);Unsteadiness on feet (R26.81)     Time: 8540-8476 PT Time Calculation (min) (ACUTE ONLY): 24 min  Charges:    $Therapeutic Exercise: 8-22 mins $Therapeutic Activity: 8-22 mins PT General Charges $$ ACUTE PT VISIT: 1 Visit                     Theo Ferretti, PT, DPT Acute Rehabilitation Services  Office: [phone]    Theo CHRISTELLA Ferretti 02/20/2024, 3:34 PM

## 2024-02-20 NOTE — [place] FL2 (Signed)
 [place]  MEDICAID FL2 LEVEL OF CARE FORM     IDENTIFICATION  Patient Name: Tracy Baker Birthdate: [birth year] Sex: female Admission Date (Current Location): 12/28/2023  [facility] and [place] Number:  Producer, Television/film/video and Address:  [facility] [place] [facility], [address] [place], [place] [address]      Provider Number: [id number]  Attending Physician Name and Address:  Kerrin Elspeth BROCKS, MD  Relative Name and Phone Number:  Bartolini,[name] Daughter [phone]    Current Level of Care: Hospital Recommended Level of Care: Skilled Nursing Facility Prior Approval Number:    Date Approved/Denied:   PASRR Number: [id number]  Discharge Plan: SNF    Current Diagnoses: Patient Active Problem List   Diagnosis Date Noted   AKI (acute kidney injury) 02/16/2024   Shock (HCC) 02/11/2024   Patient receiving ECMO 02/11/2024   Acute respiratory failure with hypoxia (HCC) 02/10/2024   S/P MVR (mitral valve replacement) 02/08/2024   Protein-calorie malnutrition, severe 01/28/2024   Epistaxis 01/22/2024   Acute candidal endocarditis 01/11/2024   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 albicans candidemia (HCC) 12/29/2023   Hypokalemia 12/29/2023   Hypocalcemia 12/29/2023   Transaminitis 12/29/2023   Colon cancer screening    Polyp of transverse colon     Orientation RESPIRATION BLADDER Height & Weight     Self, Place, Situation  Normal Continent Weight: 162 lb 0.6 oz (73.5 kg) Height:  5' 4 (162.6 cm)  BEHAVIORAL SYMPTOMS/MOOD NEUROLOGICAL BOWEL NUTRITION STATUS      Continent Diet (See dc summary)  AMBULATORY STATUS COMMUNICATION OF NEEDS Skin   Limited Assist Verbally PU Stage and Appropriate Care (Wound 02/14/24 0800 Other (Comment) Groin Bilateral; k  Wound 02/13/24 1900 Irritant Contact Dermatitis Neck Right; Wound 02/08/24 Surgical Closed  Surgical Incision Sternum Mid; Wound 01/16/24 0800 Traumatic Back Left;Upper)                       Personal Care Assistance Level of Assistance  Bathing, Feeding, Dressing Bathing Assistance: Limited assistance Feeding assistance: Limited assistance Dressing Assistance: Limited assistance     Functional Limitations Info  Sight, Hearing, Speech Sight Info: Impaired (Glasses) Hearing Info: Adequate Speech Info: Adequate    SPECIAL CARE FACTORS FREQUENCY  PT (By licensed PT), OT (By licensed OT)     PT Frequency: 5x weekly OT Frequency: 5x weekly            Contractures      Additional Factors Info  Code Status, Allergies Code Status Info: Full Allergies Info: Codeine;  Bactoshield Chg (Chlorhexidine  Gluconate);  Chlorhexidine ;  Red Dye #40 (Allura Red)           Current Medications (02/20/2024):  This is the current hospital active medication list Current Facility-Administered Medications  Medication Dose Route Frequency Provider Last Rate Last Admin   albuterol  (PROVENTIL ) (2.5 MG/3ML) 0.083% nebulizer solution 2.5 mg  2.5 mg Nebulization Q4H PRN Gretta Doffing P, DO   2.5 mg at 02/13/24 1053   amiodarone  (NEXTERONE  PREMIX) 360-4.14 MG/200ML-% (1.8 mg/mL) IV infusion  30 mg/hr Intravenous Continuous Hayes Lander L, NP 16.67 mL/hr at 02/20/24 0947 30 mg/hr at 02/20/24 0947   DOBUTamine  (DOBUTREX ) infusion 4000 mcg/mL  3 mcg/kg/min (Order-Specific) Intravenous Continuous Rolan Ezra RAMAN, MD 2.89 mL/hr at 02/20/24 1052 3 mcg/kg/min at 02/20/24 1052   feeding supplement (PROSource TF20) liquid 60 mL  60 mL Per Tube Daily Gretta Leita SQUIBB, DO   60 mL at 02/20/24 1051   feeding supplement (VITAL 1.5 CAL) liquid 1,000 mL  1,000 mL Per Tube Continuous Gretta Leita SQUIBB, DO 50 mL/hr at 02/20/24 0800 Infusion Verify at 02/20/24 0800   fiber supplement (BANATROL TF) liquid 60 mL  60 mL Per Tube BID Gretta Leita SQUIBB, DO   60 mL at 02/20/24 1051   fluconazole  (DIFLUCAN ) IVPB 400 mg  400  mg Intravenous Q24H Claudene Toribio BROCKS, MD   Stopped at 02/19/24 1312   furosemide  (LASIX ) 120 mg in dextrose  5 % 50 mL IVPB  120 mg Intravenous BID McLean, Dalton S, MD 62 mL/hr at 02/20/24 1054 120 mg at 02/20/24 1054   Gerhardt's butt cream   Topical BID Gretta Leita SQUIBB, DO   Given at 02/20/24 1052   heparin  ADULT infusion 100 units/mL (25000 units/250mL)  850 Units/hr Intravenous Continuous Clair Lynwood CROME, RPH 8.5 mL/hr at 02/20/24 0800 850 Units/hr at 02/20/24 0800   insulin  aspart (novoLOG ) injection 0-9 Units  0-9 Units Subcutaneous Q4H Gretta Leita SQUIBB, DO   2 Units at 02/20/24 9251   insulin  glargine (LANTUS ) injection 15 Units  15 Units Subcutaneous Daily Kerrin Elspeth BROCKS, MD   15 Units at 02/20/24 1051   mirtazapine  (REMERON ) tablet 15 mg  15 mg Per Tube QHS Kerrin Elspeth BROCKS, MD   15 mg at 02/19/24 2110   Oral care mouth rinse  15 mL Mouth Rinse 4 times per day Kerrin Elspeth BROCKS, MD   15 mL at 02/20/24 0820   Oral care mouth rinse  15 mL Mouth Rinse PRN Kerrin Elspeth BROCKS, MD       pantoprazole  (PROTONIX ) injection 40 mg  40 mg Intravenous QHS Harold Scholz, MD   40 mg at 02/19/24 2109   polyethylene glycol (MIRALAX  / GLYCOLAX ) packet 17 g  17 g Per Tube Daily PRN Claudene Toribio BROCKS, MD       promethazine  (PHENERGAN ) 6.25 mg/NS 50 mL IVPB  6.25 mg Intravenous Q6H PRN Gretta Leita SQUIBB, DO   Stopped at 02/19/24 2145   QUEtiapine  (SEROQUEL ) tablet 25 mg  25 mg Per Tube BID Kerrin Elspeth BROCKS, MD   25 mg at 02/20/24 1050   scopolamine  (TRANSDERM-SCOP) 1 MG/3DAYS 1 mg  1 patch Transdermal Q72H Gretta Leita SQUIBB, DO   1 mg at 02/17/24 1555   simethicone  (MYLICON) chewable tablet 80 mg  80 mg Per Tube QID PRN Gleason, Laura R, PA-C   80 mg at 02/20/24 1050   sodium chloride  flush (NS) 0.9 % injection 10-40 mL  10-40 mL Intracatheter Q12H Kerrin Elspeth BROCKS, MD   10 mL at 02/19/24 2110   sodium chloride  flush (NS) 0.9 % injection 10-40 mL  10-40 mL Intracatheter PRN Kerrin Elspeth BROCKS, MD         Discharge Medications: Please see discharge summary for a list of discharge medications.  Relevant Imaging Results:  Relevant Lab Results:   Additional Information SSN: [id number]  Arlana JINNY Moats, LCSWA

## 2024-02-20 NOTE — Progress Notes (Signed)
 PHARMACY - ANTICOAGULATION CONSULT NOTE  Pharmacy Consult for heparin  Indication: atrial fibrillation  Allergies[1]  Patient Measurements: Height: 5' 4 (162.6 cm) Weight: 73.5 kg (162 lb 0.6 oz) IBW/kg (Calculated) : 54.7 HEPARIN  DW (KG): 66.5  Vital Signs: Temp: 99 F (37.2 C) (12/17 1200) Temp Source: Bladder (12/17 0600) BP: 129/53 (12/17 1200) Pulse Rate: 70 (12/17 1200)  Labs: Recent Labs    02/18/24 1451 02/18/24 1658 02/19/24 0500 02/19/24 0524 02/19/24 1654 02/20/24 0400 02/20/24 1334  HGB 11.8*  --   --  11.6*  --  10.9*  --   HCT 37.3  --   --  36.7  --  34.4*  --   PLT 59*  --   --  58*  --  67*  --   HEPARINUNFRC  --   --   --  0.10* 0.12* 0.17* 0.27*  CREATININE  --    < > 0.83  --  1.25* 1.58*  --    < > = values in this interval not displayed.    Estimated Creatinine Clearance: 33.5 mL/min (A) (by C-G formula based on SCr of 1.58 mg/dL (H)).   Medical History: Past Medical History:  Diagnosis Date   Medical history non-contributory    Thyroid  disease    Vertigo     Medications:  Scheduled:   feeding supplement (PROSource TF20)  60 mL Per Tube Daily   fiber supplement (BANATROL TF)  60 mL Per Tube BID   Gerhardt's butt cream   Topical BID   insulin  aspart  0-9 Units Subcutaneous Q4H   insulin  glargine  15 Units Subcutaneous Daily   mirtazapine   15 mg Per Tube QHS   mouth rinse  15 mL Mouth Rinse 4 times per day   pantoprazole  (PROTONIX ) IV  40 mg Intravenous QHS   QUEtiapine   25 mg Per Tube BID   scopolamine   1 patch Transdermal Q72H   sodium chloride  flush  10-40 mL Intracatheter Q12H    Assessment: Tracy Baker is a 30 YOF now s/p MV repair and redo on 12/5. Hospitalization complicated by acute RV failure with cardiogenic shock s/p Protek duo VV ECMO cannula. Patient also with atrial fibrillation throughout this hospitalization.  Discussed with CCM/HF team and decision made to start low-dose heparin  infusion 12/14. Pharmacy consulted  for heparin  dosing.   02/20/24: Heparin  level 0.27 just below goal and trending up with slow titration, now on 850 units/hr. Hgb 10.9, pltc 67.  CRRT stopped 12/16, re-trailing lasix  challenge today with possible transition to iHD.  Will need Eliquis  eventually pending [facility] plans.   Goal of Therapy:  Heparin  level 0.3-0.5 Monitor platelets by anticoagulation protocol: Yes   Plan:  Increase heparin  IV to 950 units/hr  8h heparin  level Monitor heparin  level, CBC, and s/sx of bleeding daily  Maurilio Fila, PharmD Clinical Pharmacist 02/20/2024  2:21 PM    [1]  Allergies Allergen Reactions   Codeine Anaphylaxis and Nausea And Vomiting   Bactoshield Chg [Chlorhexidine  Gluconate] Dermatitis   Chlorhexidine  Dermatitis and Rash   Red Dye #40 (Allura Red) Nausea Only

## 2024-02-20 NOTE — Progress Notes (Addendum)
 Patient ID: Tracy Baker, female   DOB: June 13, 1955, 68 y.o.   MRN: [id number]     Advanced Heart Failure Rounding Note  Cardiologist: Lonni Cash, MD  Chief Complaint: S/p MV Repair / MV endocarditis  Patient Profile  Tracy Baker is a 68 y.o. female with hx of recent nephrolithiasis s/p ureteral stent and lithotripsy at [facility] 10/10 with stent removal. 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 Post-op course c/b post cardiotomy shock, junctional bradycardia, afib w/ RVR and HAP.  Significant events:   12/2 RHC (on 0.125 of Milrinone ): RA 4, PA 42/13 (22), PCW 12 with Vwaves to 20, CO/CI TD 3.37/2.03, PAPi 7.5 12/5 S/P Redo MVR - Intra Op given  4UPRBCs, 2UPLTs, 2FFP, 1Cyro 12/6 Protek Duo cannulation 12/8 Decannulated.  Post-op echo showed EF 50%, RV mildly dilated and mildly dysfunctional, s/p bioprosthetic mitral valve (stable). 12/9 Diuresed with IV lasix  120 mg x1.  12/10 lasix  gtt started.  12/13 CVVH begun 12/15: extubated 12/16 CRRT stopped.  Subjective:    CVP 14, - with lasix  challenge yesterday. CVVH stopped 12/16   Currently on dobutamine  5.  Co-ox 55%.    She remains fluconazole  (plan for 6 weeks from OR, stop date 1/16), cefepime  complete 12/16. Tm 99.3.   Feels better today. Aware of plans for iHD today. Denies CP/SOB.   Objective:    Weight Range: 73.5 kg Body mass index is 27.81 kg/m.   Vital Signs:   Temp:  [98.8 F (37.1 C)-99.3 F (37.4 C)] 98.8 F (37.1 C) (12/17 0800) Pulse Rate:  [57-117] 74 (12/17 0800) Resp:  [13-29] 16 (12/17 0800) BP: (78-133)/(37-106) 113/54 (12/17 0800) SpO2:  [87 %-95 %] 93 % (12/17 0800) Weight:  [73.5 kg] 73.5 kg (12/17 0500) Last BM Date : 02/20/24  Weight change: Filed Weights   02/18/24 0500 02/19/24 0500 02/20/24 0500  Weight: 74.5 kg 71.8 kg 73.5 kg   Intake/Output:  Intake/Output Summary (Last 24 hours) at 02/20/2024 0903 Last data filed at 02/20/2024  0800 Gross per 24 hour  Intake 2407.1 ml  Output 440 ml  Net 1967.1 ml   Physical Exam   General:  chronically ill appearing Neck: JVD UTA.  Cor: Irregular rate & rhythm.  Lungs: clear, diminished bases Extremities: +1 BLE edema Neuro: A&O x3  Telemetry   Bigeminy 70s (personally reviewed)   Labs    CBC Recent Labs    02/19/24 0524 02/20/24 0400  WBC 12.1* 10.4  HGB 11.6* 10.9*  HCT 36.7 34.4*  MCV 99.7 99.4  PLT 58* 67*   Basic Metabolic Panel Recent Labs    87/83/74 0524 02/19/24 1654 02/20/24 0400  NA  --  138 138  K  --  4.8 4.9  CL  --  102 103  CO2  --  26 25  GLUCOSE  --  100* 108*  BUN  --  40* 51*  CREATININE  --  1.25* 1.58*  CALCIUM   --  8.9 8.9  MG 2.5*  --  2.5*  PHOS  --  3.4 4.0   Liver Function Tests Recent Labs    02/19/24 1654 02/20/24 0400  AST  --  33  ALT  --  20  ALKPHOS  --  273*  BILITOT  --  1.1  PROT  --  5.7*  ALBUMIN  3.4* 3.3*   Medications:    Scheduled Medications:  feeding supplement (PROSource TF20)  60 mL Per Tube Daily   fiber supplement (BANATROL TF)  60 mL Per Tube BID   Gerhardt's butt cream   Topical BID   insulin  aspart  0-9 Units Subcutaneous Q4H   insulin  glargine  15 Units Subcutaneous Daily   mirtazapine   15 mg Per Tube QHS   mouth rinse  15 mL Mouth Rinse 4 times per day   pantoprazole  (PROTONIX ) IV  40 mg Intravenous QHS   QUEtiapine   25 mg Per Tube BID   scopolamine   1 patch Transdermal Q72H   sodium chloride  flush  10-40 mL Intracatheter Q12H    Infusions:  amiodarone  30 mg/hr (02/20/24 0800)   DOBUTamine  5 mcg/kg/min (02/20/24 0800)   feeding supplement (VITAL 1.5 CAL) 50 mL/hr at 02/20/24 0800   fluconazole  (DIFLUCAN ) IV Stopped (02/19/24 1312)   heparin  850 Units/hr (02/20/24 0800)   promethazine  (PHENERGAN ) injection (IM or IVPB) Stopped (02/19/24 2145)    PRN Medications: albuterol , mouth rinse, polyethylene glycol, promethazine  (PHENERGAN ) injection (IM or IVPB), sodium chloride   flush  Assessment/Plan   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 Kerrin for Candida endocarditis. Pre-op L/RHC 01/04/24 with no CAD and normal L/R heart pressures.  - Failed initial Milrinone  wean. Eventually weaned off, unfortunately restarted 12/1 for low output.  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 - Respiratory culture- Enterobacter, On cefepime , vancomycin , and fluconazole . Vancomycin  completed.  Low grade fever noted 12/15.  - CVVH stopped yesterday. Put out with lasix  challenge. CVP 14. Will give 120 IV BID with rise in CVP. Plan to reevaluate CRRT needs tomorrow.  - Suspect that this is primarily RV failure. Continue RV support with dobutamine  5.    Acute hypoxic respiratory failure: Suspect component of infection/aspiration PNA given unilateral disease.  - Respiratory culture- Enterobacter, On cefepime  and fluconazole .  - Extubated 12/15   Coagulopathy/thrombocytopenia/ABLA anemia:  - Thrombocytopenia present, platelets 61 => 64 > 53>58 today.  Suspect this is inflammatory due to critical illness.  - INR now back to normal, PTT not significantly elevated, no DIC - Continue heparin  gtt  Sick sinus - Initially with asystole underneath (as of 12/10) - Now stable rhythm, NSR alternating with accelerated junctional/atrial fibrillation. Intt bigeminy.  Afib - Post operative - Continue heparin  gtt - Bigeminy this morning. Continue amidarone @30   AKI on CKD 3b - Now CVVH with poor diuresis and rising BUN.  See above.  - Mgmt per nephrology  Protein-calorie malnutrition -  On TF - SLP following, plan for repeat eval today  Epistaxis  - s/p bilateral nasal packing per ENT 12/1     Pressure Ulcer R Buttock, DTI - Q2 turns, offload  Left radial artery occluded - Left ulnar artery is patent.  - Continue heparin  gtt   CRITICAL CARE Performed by: Beckey LITTIE Coe  Total critical care time: 15 minutes  Critical care time was exclusive of separately billable procedures and treating other patients.  Critical care was necessary to treat or prevent imminent or life-threatening deterioration.  Critical care was time spent personally by me on the following activities: development of treatment plan with patient and/or surrogate as well as nursing, discussions with consultants, evaluation of patient's response to treatment, examination of patient,  obtaining history from patient or surrogate, ordering and performing treatments and interventions, ordering and review of laboratory studies, ordering and review of radiographic studies, pulse oximetry and re-evaluation of patient's condition.   Beckey LITTIE Coe 02/20/2024 9:03 AM  Patient seen with NP, I formulated the plan and agree with the above note.    CVVH stopped yesterday, not much response to Lasix  bolus yesterday.  CVP 11 today.  Co-ox 55% on dobutamine  5, this has been stable.  She appears to be in atrial fibrillation today on amiodarone  gtt + heparin  gtt.   General: NAD Neck: JVP 10-12 cm, no thyromegaly or thyroid  nodule.  Lungs: Clear to auscultation bilaterally with normal respiratory effort. CV: Nondisplaced PMI.  Heart irregular S1/S2, no S3/S4, no murmur.  1+ edema to knees.  Abdomen: Soft, nontender, no hepatosplenomegaly, no distention.  Skin: Intact without lesions or rashes.  Neurologic: Alert and oriented x 3.  Psych: Normal affect. Extremities: No clubbing or cyanosis.  HEENT: Normal.   Off CVVH, CVP 11.  We are going to try a Lasix  challenge again today to see if we can avoid HD.  Will give Lasix   120 mg IV bid today.    Co-ox 55% on dobutamine  5, she is off NE.  Will try dropping dobutamine  to 3 today.     She has completed cefepime , remains on fluconazole .    Platelets 58 => 67.  Thrombocytopenia predates heparin , discussed with pharmacy and think not due to HIT.  Continue heparin  gtt.  She is in atrial fibrillation with HR 80s on amiodarone  gtt.  Confirm rhythm with ECG.     Very weak, need to mobilize.   CRITICAL CARE Performed by: Ezra Shuck  Total critical care time: 35 minutes  Critical care time was exclusive of separately billable procedures and treating other patients.  Critical care was necessary to treat or prevent imminent or life-threatening deterioration.  Critical care was time spent personally by me on the following activities: development of treatment plan with patient and/or surrogate as well as nursing, discussions with consultants, evaluation of patient's response to treatment, examination of patient, obtaining history from patient or surrogate, ordering and performing treatments and interventions, ordering and review of laboratory studies, ordering and review of radiographic studies, pulse oximetry and re-evaluation of patient's condition.  Ezra Shuck 02/20/2024 10:44 AM

## 2024-02-20 NOTE — Progress Notes (Signed)
 Objective Swallowing Evaluation: Type of Study: FEES-Fiberoptic Endoscopic Evaluation of Swallow   Patient Details  Name: Tracy Baker MRN: [id number] Date of Birth: 01-25-1956  Today's Date: 02/20/2024 Time: SLP Start Time (ACUTE ONLY): 1035 -SLP Stop Time (ACUTE ONLY): 1115  SLP Time Calculation (min) (ACUTE ONLY): 40 min   Past Medical History:  Past Medical History:  Diagnosis Date   Medical history non-contributory    Thyroid  disease    Vertigo    Past Surgical History:  Past Surgical History:  Procedure Laterality Date   APPENDECTOMY     AUGMENTATION MAMMAPLASTY Bilateral 1981   removed in 2006   BREAST IMPLANT REMOVAL Bilateral 2007   CARDIOVERSION N/A 01/22/2024   Procedure: CARDIOVERSION;  Surgeon: Rolan Ezra RAMAN, MD;  Location: [facility] INVASIVE CV LAB;  Service: Cardiovascular;  Laterality: N/A;   CHOLECYSTECTOMY  2010   COLONOSCOPY WITH PROPOFOL  N/A 06/30/2021   Procedure: COLONOSCOPY WITH PROPOFOL ;  Surgeon: Jinny Carmine, MD;  Location: [facility] ENDOSCOPY;  Service: Endoscopy;  Laterality: N/A;   CONTROL OF EPISTAXIS  02/04/2024   ECMO CANNULATION N/A 02/09/2024   Procedure: ECMO CANNULATION;  Surgeon: Zenaida Morene PARAS, MD;  Location: [facility];  Service: Cardiovascular;  Laterality: N/A;   INTRAOPERATIVE TRANSESOPHAGEAL ECHOCARDIOGRAM N/A 01/10/2024   Procedure: ECHOCARDIOGRAM, TRANSESOPHAGEAL, INTRAOPERATIVE;  Surgeon: Kerrin Elspeth BROCKS, MD;  Location: [facility] OR;  Service: Open Heart Surgery;  Laterality: N/A;   INTRAOPERATIVE TRANSESOPHAGEAL ECHOCARDIOGRAM N/A 02/08/2024   Procedure: ECHOCARDIOGRAM, TRANSESOPHAGEAL, INTRAOPERATIVE;  Surgeon: Kerrin Elspeth BROCKS, MD;  Location: [facility] OR;  Service: Open Heart Surgery;  Laterality: N/A;   LAPAROSCOPIC TOTAL HYSTERECTOMY  2006   MITRAL VALVE REPAIR N/A 01/10/2024   Procedure: REPAIR, MITRAL VALVE;  Surgeon: Kerrin Elspeth BROCKS, MD;  Location: [facility] OR;  Service: Open Heart Surgery;  Laterality: N/A;   MITRAL VALVE  REPLACEMENT N/A 02/08/2024   Procedure: REPLACEMENT, MITRAL VALVE WITH MITRIS RESILIA MITRAL VALVE 27mm;  Surgeon: Kerrin Elspeth BROCKS, MD;  Location: [facility] OR;  Service: Open Heart Surgery;  Laterality: N/A;   REDO STERNOTOMY N/A 02/08/2024   Procedure: REDO STERNOTOMY;  Surgeon: Kerrin Elspeth BROCKS, MD;  Location: [facility] OR;  Service: Open Heart Surgery;  Laterality: N/A;   RIGHT HEART CATH N/A 02/05/2024   Procedure: RIGHT HEART CATH;  Surgeon: Zenaida Morene PARAS, MD;  Location: [facility] INVASIVE CV LAB;  Service: Cardiovascular;  Laterality: N/A;   RIGHT/LEFT HEART CATH AND CORONARY ANGIOGRAPHY N/A 01/04/2024   Procedure: RIGHT/LEFT HEART CATH AND CORONARY ANGIOGRAPHY;  Surgeon: Verlin Lonni BIRCH, MD;  Location: [facility];  Service: Cardiovascular;  Laterality: N/A;   TOOTH EXTRACTION N/A 01/07/2024   Procedure: DENTAL RESTORATION/EXTRACTIONS;  Surgeon: Sheryle Hamilton, DMD;  Location: [facility] OR;  Service: Oral Surgery;  Laterality: N/A;  EXTRACTION OF TEETH #2, #6, #7, #16, #29   TRANSESOPHAGEAL ECHOCARDIOGRAM (CATH LAB) N/A 01/01/2024   Procedure: TRANSESOPHAGEAL ECHOCARDIOGRAM;  Surgeon: Lonni Slain, MD;  Location: [facility] INVASIVE CV LAB;  Service: Cardiovascular;  Laterality: N/A;   TRANSESOPHAGEAL ECHOCARDIOGRAM (CATH LAB) N/A 01/22/2024   Procedure: TRANSESOPHAGEAL ECHOCARDIOGRAM;  Surgeon: Rolan Ezra RAMAN, MD;  Location: [facility] INVASIVE CV LAB;  Service: Cardiovascular;  Laterality: N/A;   HPI: 68 yo female admitted 10/25 after leaving AMA from [facility] where she was being treated for C. Albicans candidemia. TEE with mitral valve vegetation 10/28. Dental extractions 11/3. Mitral valve repair, ETT <24 hrs 11/6-11/7 (passed [facility] post-extubation). Redo sternotomy with MVR 12/5, developed R ventricular failure 12/6, requiring ECMO cannulation 12/6-12/8. Remained intubated post-operatively  12/5-12/15. CRRT 12/13-12/16. PMH includes thyroid  disease   Subjective: alert    Assessment / Plan /  Recommendation     02/20/2024   11:44 AM  Clinical Impressions  Clinical Impression Pt exhibits mild pharyngeal dysphagia characterized by intermittent mistiming but with adequate airway protection. Laryngeal and pharyngeal tissues appear healthy and moist with complete vocal fold adduction. There are thin, clear secretions standing in the valleculae and pyriform sinuses, consistent with a score of 2 on the [place] Secretion Scale. The swallow is initiated at the pyriform sinuses with liquids but epiglottic inversion and complete laryngeal closure prevent aspiration with challenging sips of thin and nectar thick liquids. There was penetration with the initial ice chip given and consistent interarytenoid space residuals that did not increase in volume with subsequent trials. Spontaneous coughing was not observed. Expect adequate laryngeal sensation as she was sensate to the scope progressing to the post-swallow position. Minimal residue was noted in the valleculae and pyriform sinuses, most notably with solids that ultimately cleared with a liquid wash. Recommend a regular diet and thin liquids, monitoring for overt coughing with PO intake. Supervision is recommended initially in light of profound deconditioning. SLP will continue following.    SLP Visit Diagnosis Dysphagia, pharyngeal phase (R13.13)  Attention and concentration deficit following --  Frontal lobe and executive function deficit following --  Impact on safety and function Moderate aspiration risk         02/20/2024   11:44 AM  Treatment Recommendations  Treatment Recommendations Therapy as outlined in treatment plan below        02/20/2024   11:44 AM  Prognosis  Prognosis for improved oropharyngeal function Good  Barriers to Reach Goals Time post onset  Barriers/Prognosis Comment --    Swallow Evaluation Recommendations Recommendations: PO diet PO Diet Recommendation: Regular;Thin liquids (Level 0) Liquid  Administration via: Cup;Straw Medication Administration: Whole meds with puree Supervision: Staff to assist with self-feeding;Full supervision/cueing for swallowing strategies Postural changes: Position pt fully upright for meals;Stay upright 30-60 min after meals Oral care recommendations: Oral care BID (2x/day)          02/20/2024   11:44 AM  Other Recommendations  Recommended Consults --  Oral Care Recommendations Oral care BID  Caregiver Recommendations --  Follow Up Recommendations Acute inpatient rehab (3hours/day)  Assistance recommended at discharge --  Functional Status Assessment Patient has had a recent decline in their functional status and demonstrates the ability to make significant improvements in function in a reasonable and predictable amount of time.       02/20/2024   11:44 AM  Frequency and Duration   Speech Therapy Frequency (ACUTE ONLY) min 2x/week  Treatment Duration 2 weeks         02/20/2024   11:44 AM  Oral Phase  Oral Phase WFL  Oral - Pudding Teaspoon --  Oral - Pudding Cup --  Oral - Honey Teaspoon --  Oral - Honey Cup --  Oral - Nectar Teaspoon --  Oral - Nectar Cup --  Oral - Nectar Straw --  Oral - Thin Teaspoon --  Oral - Thin Cup --  Oral - Thin Straw --  Oral - Puree --  Oral - Mech Soft --  Oral - Regular --  Oral - Multi-Consistency --  Oral - Pill --  Oral Phase - Comment --       02/20/2024   11:44 AM  Pharyngeal Phase  Pharyngeal Phase Impaired  Pharyngeal- Pudding Teaspoon --  Pharyngeal --  Pharyngeal- Pudding Cup --  Pharyngeal --  Pharyngeal- Honey Teaspoon --  Pharyngeal --  Pharyngeal- Honey Cup --  Pharyngeal --  Pharyngeal- Nectar Teaspoon NT  Pharyngeal --  Pharyngeal- Nectar Cup NT  Pharyngeal --  Pharyngeal- Nectar Straw Delayed swallow initiation-pyriform sinuses  Pharyngeal Material does not enter airway  Pharyngeal- Thin Teaspoon Delayed swallow initiation-pyriform sinuses  Pharyngeal Material  does not enter airway  Pharyngeal- Thin Cup Delayed swallow initiation-pyriform sinuses  Pharyngeal Material does not enter airway  Pharyngeal- Thin Straw Delayed swallow initiation-pyriform sinuses  Pharyngeal Material does not enter airway  Pharyngeal- Puree Pharyngeal residue - valleculae;Pharyngeal residue - pyriform  Pharyngeal Material does not enter airway  Pharyngeal- Mechanical Soft NT  Pharyngeal --  Pharyngeal- Regular Pharyngeal residue - valleculae;Pharyngeal residue - pyriform;Compensatory strategies attempted (with notebox)  Pharyngeal Material does not enter airway  Pharyngeal- Multi-consistency NT  Pharyngeal --  Pharyngeal- Pill NT  Pharyngeal --  Pharyngeal Comment --        02/20/2024   11:44 AM  Cervical Esophageal Phase   Cervical Esophageal Phase WFL  Pudding Teaspoon --  Pudding Cup --  Honey Teaspoon --  Honey Cup --  Nectar Teaspoon --  Nectar Cup --  Nectar Straw --  Thin Teaspoon --  Thin Cup --  Thin Straw --  Puree --  Mechanical Soft --  Regular --  Multi-consistency --  Pill --  Cervical Esophageal Comment --     Damien Blumenthal, M.A., CCC-SLP Speech Language Pathology, Acute Rehabilitation Services  Secure Chat preferred [phone]  02/20/2024, 12:16 PM

## 2024-02-20 NOTE — Progress Notes (Signed)
 PHARMACY - ANTICOAGULATION CONSULT NOTE  Pharmacy Consult for heparin  Indication: atrial fibrillation  Labs: Recent Labs    02/18/24 1451 02/18/24 1658 02/19/24 0500 02/19/24 0524 02/19/24 1654 02/20/24 0400 02/20/24 1334 02/20/24 2114  HGB 11.8*  --   --  11.6*  --  10.9*  --   --   HCT 37.3  --   --  36.7  --  34.4*  --   --   PLT 59*  --   --  58*  --  67*  --   --   HEPARINUNFRC  --   --   --  0.10* 0.12* 0.17* 0.27* 0.27*  CREATININE  --    < > 0.83  --  1.25* 1.58*  --   --    < > = values in this interval not displayed.   Assessment: 68yo female remains subtherapeutic on heparin  after rate change; no infusion issues or signs of bleeding per RN.  Goal of Therapy:  Heparin  level 0.3-0.5 units/ml   Plan:  Increase heparin  infusion slightly to 1000 units/hr. Check level in 8 hours.   Marvetta Dauphin, PharmD, BCPS 02/20/2024 11:10 PM

## 2024-02-20 NOTE — Progress Notes (Signed)
 [facility]  Date of Admission:  12/28/2023    Principal Problem:   C. albicans candidemia (HCC) Active Problems:   Hypokalemia   Hypocalcemia   Transaminitis   Kidney stone   Endocarditis of mitral valve   Fungal endocarditis   S/P MVR (mitral valve repair)   Acute candidal endocarditis   Epistaxis   Protein-calorie malnutrition, severe   S/P MVR (mitral valve replacement)   Acute respiratory failure with hypoxia (HCC)   Shock (HCC)   Patient receiving ECMO   AKI (acute kidney injury)          Assessment: 68 y.o. female with candidemia after urologic procedure with a trip see placement of stent and removal of stent hospitalized in [place]   [facility]. Admitted with:   #Candida albicans Mitral Valve Endocarditis  requiring MV repair on 11/6 c/b severe MR requiring redo sternotomy and MVR on 02/08/24 -01/10/24 MVR -Ok t transiton from IV fluconzole 400mg  daily to  PO at time of discharge - Hospital course was c/b enterobacter PNA -treated with cefepime  x 7 days Recommendations: -Reset the clock from OR on 12/5, EOT 03/21/23 for fluconzole -F/U in clinic on 03/12/23 with Dr. Fleeta Rothman -communicated plan to primary -Standard precautions  Microbiology:   Antibiotics:  fluconazole     SUBJECTIVE: Resting in bed. No new complaints.  Interval: Afebrile overnight  Review of Systems: Review of Systems  All other systems reviewed and are negative.    Scheduled Meds:  feeding supplement (PROSource TF20)  60 mL Per Tube Daily   fiber supplement (BANATROL TF)  60 mL Per Tube BID   Gerhardt's butt cream   Topical BID   insulin  aspart  0-9 Units Subcutaneous Q4H   insulin  glargine  15 Units Subcutaneous Daily   mirtazapine   15 mg Per Tube QHS   mouth rinse  15 mL Mouth Rinse 4 times per day   pantoprazole  (PROTONIX ) IV  40 mg Intravenous QHS   QUEtiapine   25 mg Per Tube BID   scopolamine   1 patch Transdermal Q72H   sodium  chloride flush  10-40 mL Intracatheter Q12H   Continuous Infusions:  amiodarone  30 mg/hr (02/20/24 0947)   DOBUTamine  3 mcg/kg/min (02/20/24 1052)   feeding supplement (VITAL 1.5 CAL) 50 mL/hr at 02/20/24 0800   fluconazole  (DIFLUCAN ) IV 400 mg (02/20/24 1220)   furosemide  120 mg (02/20/24 1054)   heparin  850 Units/hr (02/20/24 0800)   promethazine  (PHENERGAN ) injection (IM or IVPB) Stopped (02/19/24 2145)   PRN Meds:.albuterol , mouth rinse, polyethylene glycol, promethazine  (PHENERGAN ) injection (IM or IVPB), simethicone , sodium chloride  flush Allergies[1]  OBJECTIVE: Vitals:   02/20/24 0900 02/20/24 1000 02/20/24 1100 02/20/24 1200  BP: (!) 123/47 (!) 118/53 128/73 (!) 129/53  Pulse: (!) 55 76 67 70  Resp: 14 16 (!) 24 17  Temp: 98.8 F (37.1 C) 98.8 F (37.1 C) 98.8 F (37.1 C) 99 F (37.2 C)  TempSrc:      SpO2: 94% 95% 95% 95%  Weight:      Height:       Body mass index is 27.81 kg/m.  Physical Exam Constitutional:      Appearance: Normal appearance.  HENT:     Head: Normocephalic and atraumatic.     Right Ear: Tympanic membrane normal.     Left Ear: Tympanic membrane normal.     Nose: Nose normal.     Mouth/Throat:     Mouth: Mucous membranes are  moist.  Eyes:     Extraocular Movements: Extraocular movements intact.     Conjunctiva/sclera: Conjunctivae normal.     Pupils: Pupils are equal, round, and reactive to light.  Cardiovascular:     Heart sounds:     No friction rub. No gallop.     Comments: Sternal wound Pulmonary:     Effort: Pulmonary effort is normal.     Breath sounds: Normal breath sounds.  Abdominal:     General: Abdomen is flat.     Palpations: Abdomen is soft.  Musculoskeletal:        General: Normal range of motion.  Skin:    General: Skin is warm and dry.  Neurological:     General: No focal deficit present.     Mental Status: She is alert and oriented to person, place, and time.  Psychiatric:        Mood and Affect: Mood  normal.       Lab Results Lab Results  Component Value Date   WBC 10.4 02/20/2024   HGB 10.9 (L) 02/20/2024   HCT 34.4 (L) 02/20/2024   MCV 99.4 02/20/2024   PLT 67 (L) 02/20/2024    Lab Results  Component Value Date   CREATININE 1.58 (H) 02/20/2024   BUN 51 (H) 02/20/2024   NA 138 02/20/2024   K 4.9 02/20/2024   CL 103 02/20/2024   CO2 25 02/20/2024    Lab Results  Component Value Date   ALT 20 02/20/2024   AST 33 02/20/2024   ALKPHOS 273 (H) 02/20/2024   BILITOT 1.1 02/20/2024        Loney Stank, MD [facility] 02/20/2024, 1:49 PM Evaluation of this patient requires complex antimicrobial therapy evaluation and counseling + isolation needs for disease transmission risk assessment and mitigation      [1]  Allergies Allergen Reactions   Codeine Anaphylaxis and Nausea And Vomiting   Bactoshield Chg [Chlorhexidine  Gluconate] Dermatitis   Chlorhexidine  Dermatitis and Rash   Red Dye #40 (Allura Red) Nausea Only

## 2024-02-20 NOTE — Progress Notes (Signed)
 11 Days Post-Op Procedures (LRB): ECMO CANNULATION (N/A) Subjective:  I feel better  Objective: Vital signs in last 24 hours: Temp:  [98.8 F (37.1 C)-99.3 F (37.4 C)] 98.8 F (37.1 C) (12/17 0800) Pulse Rate:  [57-117] 74 (12/17 0800) Cardiac Rhythm: Junctional rhythm;Atrial fibrillation (12/17 0757) Resp:  [13-29] 16 (12/17 0800) BP: (78-133)/(37-106) 113/54 (12/17 0800) SpO2:  [87 %-95 %] 93 % (12/17 0800) Weight:  [73.5 kg] 73.5 kg (12/17 0500)  Hemodynamic parameters for last 24 hours: CVP:  [4 mmHg-14 mmHg] 14 mmHg  Intake/Output from previous day: 12/16 0701 - 12/17 0700 In: 2336.3 [I.V.:597.9; NG/GT:1288.3; IV Piggyback:450] Out: 600 [Urine:440] Intake/Output this shift: No intake/output data recorded.  General appearance: alert, cooperative, and no distress Neurologic: intact Heart: irregularly irregular rhythm Lungs: clear to auscultation bilaterally Wound: clean and dry  Lab Results: Recent Labs    02/19/24 0524 02/20/24 0400  WBC 12.1* 10.4  HGB 11.6* 10.9*  HCT 36.7 34.4*  PLT 58* 67*   BMET:  Recent Labs    02/19/24 1654 02/20/24 0400  NA 138 138  K 4.8 4.9  CL 102 103  CO2 26 25  GLUCOSE 100* 108*  BUN 40* 51*  CREATININE 1.25* 1.58*  CALCIUM  8.9 8.9    PT/INR: No results for input(s): LABPROT, INR in the last 72 hours. ABG    Component Value Date/Time   PHART 7.340 (L) 02/13/2024 1618   HCO3 18.0 (L) 02/13/2024 1618   TCO2 19 (L) 02/13/2024 1618   ACIDBASEDEF 7.0 (H) 02/13/2024 1618   O2SAT 54.6 02/20/2024 0436   CBG (last 3)  Recent Labs    02/20/24 0007 02/20/24 0437 02/20/24 0740  GLUCAP 118* 101* 157*    Assessment/Plan: S/P Procedures (LRB): ECMO CANNULATION (N/A) Looks better today- more alert, animated NEURO- intact CV- remains in junctional alternating with A fib  Started on amiodarone  IV  On heparin  drip until able to take PO  Right heart failure- on dobutamine - will d/w AHF team  Co-ox 55- I don't  think rhythm is doing her any favors with co-ox RESP- stable RENAL- creatinine up to 1.6 and BUN up to 51  For HD today, but at some point in near future will need to see how kidneys will do ENDO- CBG mildly elevated GI- tolerating tube feedings  Swallow eval today Deconditioning - severe,   PT/OT ID- completed cefepime  and vanco  Diflucan  for candida   LOS: 49 days    [name] 02/20/2024

## 2024-02-20 NOTE — Progress Notes (Signed)
 TCTS PM Rounding Progress Note  Sat in chair for 1.5 hours today Lasix  gtt at 120 with some urine output this shift Hasn't been sleeping at all  Vitals:   02/20/24 1700 02/20/24 1800  BP:  (!) 130/54  Pulse: 81 65  Resp: (!) 24 (!) 22  Temp: 98.8 F (37.1 C) 99.1 F (37.3 C)  SpO2: (!) 89% 94%    Plan: - Takes clonipin at home for sleep, will try a small dose tonight   Con Clunes, MD Cardiothoracic Surgery Pager: [phone]

## 2024-02-20 NOTE — Progress Notes (Signed)
 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 Kerrin. PCCM consulted for ICU post-op vent management initially.  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 Kerrin 12/6: Overnight patient started requiring more vasopressor support, early morning she was on dobutamine  5, milrinone  0.375, epinephrine  at 10, vasopressin  0.04 and norepinephrine  34 mics, she is not making urine.  Cannulated with Protek duo for RV support,  12/7 bronch, broadened antibiotics 12/8 decannulated from VV ECMO after sweep and flow wean trials. Lasix  stopped. Restarted TF. 12/10 chest tubes out, off pressors. Aline removed. 12/13 started CRRT 12/15 extubated, weak, continued fevers remains on CRRT 12/16 CRRT off this AM, less delirious, LE dopplers neg 12/17 off CRRT, trial diuresis   Interim History / Subjective:  No overnight events, delirium seems improved 440 cc urine output yesterday after 100 mg of Lasix  Holding IHD today, additional Lasix  milligrams twice daily Co. ox 54%, plan to decrease dobutamine  per heart failure   Objective    Blood pressure (!) 130/58, pulse 77, temperature 98.8 F (37.1 C), resp. rate 19, height 5' 4 (1.626 m), weight 73.5 kg, SpO2 93%. CVP:  [4 mmHg-14 mmHg] 14 mmHg       Intake/Output Summary (Last 24 hours) at 02/20/2024 0803 Last data filed at 02/20/2024 9377 Gross per 24 hour  Intake 2276.44 ml  Output 440 ml  Net 1836.44 ml   Filed Weights   02/18/24 0500 02/19/24 0500 02/20/24 0500  Weight: 74.5 kg 71.8 kg 73.5 kg     General: Elderly ill-appearing female resting in bed in NAD, appears generally weak HEENT: MM pink/moist, sclera anicteric Neuro: Following commands, oriented to person and place globally weak, nonfocal exam CV: s1s2 atrial fibrillation, no m/r/g PULM: Diminished in the bilateral bases without any distress, on room air GI: soft, nondistended Extremities: warm/dry, 1+ peripheral edema     440cc UOP yesterday  Coox 52% Creatinine 1.58 up from 1.25 yesterday White blood cell count 10.4 Hemoglobin 10.9  Trach aspirate culture 12/13>rare enterobacter>pan-sensitive, Repeat 12/13 growing the same    Patient Lines/Drains/Airways Status     Active Line/Drains/Airways     Name Placement date Placement time Site Days   PICC Double Lumen 01/15/24 Right Basilic 35 cm 0 cm 01/15/24  8384  -- 36   Hemodialysis Catheter Left Internal jugular Triple lumen Temporary (Non-Tunneled) 02/16/24  1600  Internal jugular  4   Urethral Catheter Lauraine Holts, RN Temperature probe 14 Fr. 02/18/24  1422  Temperature probe  2   Small Bore Feeding Tube Left nare Marking at nare/corner of mouth 67 cm 02/15/24  1135  Left nare  5   Wound 01/16/24 0800 Traumatic Back Left;Upper 01/16/24  0800  Back  35   Wound 02/08/24 Surgical Closed Surgical Incision Sternum Mid 02/08/24  --  Sternum  12  Wound 02/12/24 1800 Pressure Injury Buttocks Right;Medial Deep Tissue Pressure Injury - Purple or maroon localized area of discolored intact skin or blood-filled blister due to damage of underlying soft tissue from pressure and/or shear. 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, with persistent severe mitral regurgitation and status post redo sternotomy and bioprosthetic MVR Previous candidemia Acute RV failure with cardiogenic shock status post Protek duo VV ECMO cannula on 12/6, decannulated 12/8 - Dobutamine  decreased from 5 mcg to 3 mcg -Per ID reset the clock on her fluconazole  from last OR on 12/5 EOT 03/21/2023 -off CRRT 12/16, Lasix  challenge before starting IHD -daily aspirin  -Continue heparin  drip, on Eliquis  prior to admission -working with PT/OT needs aggressive    Shock, mixed cardiogenic and distributive; continues to improve -cefepime  started 12/9, completed 12/16  -DBA decreased from 5 mcg to 3 mcg per heart failure -Fever curve improving, procal indeterminate  -off norepi  Acute respiratory failure with hypoxia RUL pneumonia- Enterobacter -passed SBT/SAT and extubated to [place]  - Globally weak, failed swallow yesterday, TF continued -FEES today - Encourage IS   AKI on CKD stage IIIa, Cr plateau, but BUN still rising -strict I/O -renally dose meds, avoid nephrotoxic meds - attempt to avoid iHD with diuresis  Hypoglycemia improved; now mild hyperglycemia on TF H/o prediabetes, A1c 6 -SSI PRN - Continue 2 units q4h TF coverage and glargine 15 units daily -goal BG 140-180    Acute blood loss anemia status post multiple PRBC transfusion Thrombocytopenia due to critical illness status post 2 unit platelets Coagulopathy, probably from liver congestion requiring FFP transfusion -transfuse for Hb <7 or hemodynamically significant bleeding -with platelets slowly recovering continue heparin  gtt   Severe protein calorie malnutrition -FEES today, meanwhile continue TF at goal via cortrak   Hyperbilirubinemia; unclear etiology, stable  -CMP  in AM  Deconditioning, prolonged course of illness leading up to this  -PT, OT -anticipate she will need CIR  Pressure ulcer on buttocks; skin breakdown from bandage on R neck -optimizing nutrition -FEES -PT/OT, encourage mobilization  - Possible chlorhexidine  allergy, may have also been from adhesives - Wound care following  Left hand cool> resolved; has thrombus in L radial artery -fixed dose heparin  -LE dopplers negative   Labs:  CBC: Recent Labs  Lab 02/17/24 0423 02/18/24 0422 02/18/24 1451 02/19/24 0524 02/20/24 0400  WBC 12.1* 10.5 13.3* 12.1* 10.4  HGB 12.4 11.6* 11.8* 11.6* 10.9*  HCT 39.3 36.6 37.3 36.7 34.4*  MCV 100.0 99.5 100.3* 99.7 99.4  PLT 64* 53* 59* 58* 67*    Basic Metabolic Panel: Recent Labs  Lab 02/16/24 0515 02/17/24 0423 02/17/24 1617 02/18/24 0422 02/18/24 1658 02/19/24 0500 02/19/24 0524 02/19/24 1654 02/20/24 0400  NA 144 141   < > 138 137 139  --  138 138  K 4.0 4.2   < > 4.3 4.6 4.4  --  4.8 4.9  CL 107 101   < > 102 100 102  --  102 103  CO2 25 29   < > 28 28 27   --  26 25  GLUCOSE 203* 185*   < >  149* 103* 149*  --  100* 108*  BUN 102* 58*   < > 30* 30* 24*  --  40* 51*  CREATININE 2.02* 1.12*   < > 0.82 0.81 0.83  --  1.25* 1.58*  CALCIUM  8.3* 8.4*   < > 8.2* 8.4* 8.7*  --  8.9 8.9  MG 2.1 2.2  --  2.4  --   --  2.5*  --  2.5*  PHOS 5.2* 2.7   < > 2.4* 1.9* 3.8  --  3.4 4.0   < > = values in this interval not displayed.   GFR: Estimated Creatinine Clearance: 33.5 mL/min (A) (by C-G formula based on SCr of 1.58 mg/dL (H)). Recent Labs  Lab 02/18/24 0422 02/18/24 1451 02/19/24 0524 02/20/24 0400  PROCALCITON  --  1.24  --   --   WBC 10.5 13.3* 12.1* 10.4    Liver Function Tests: Recent Labs  Lab 02/17/24 0423 02/17/24 1617 02/18/24 0422 02/18/24 1658 02/19/24 0500 02/19/24 1654 02/20/24 0400  AST 27  --   --   --   --   --  33  ALT 12  --   --   --   --   --  20  ALKPHOS 209*  --   --   --   --   --  273*   BILITOT 1.3*  --   --   --   --   --  1.1  PROT 5.9*  --   --   --   --   --  5.7*  ALBUMIN  2.5*  2.5*   < > 2.6* 2.6* 2.7* 3.4* 3.3*   < > = values in this interval not displayed.   ABG    Component Value Date/Time   PHART 7.340 (L) 02/13/2024 1618   PCO2ART 33.4 02/13/2024 1618   PO2ART 89 02/13/2024 1618   HCO3 18.0 (L) 02/13/2024 1618   TCO2 19 (L) 02/13/2024 1618   ACIDBASEDEF 7.0 (H) 02/13/2024 1618   O2SAT 54.6 02/20/2024 0436    Coagulation Profile: No results for input(s): INR, PROTIME in the last 168 hours.     CBG: Recent Labs  Lab 02/19/24 1500 02/19/24 2012 02/20/24 0007 02/20/24 0437 02/20/24 0740  GLUCAP 96 131* 118* 101* 157*   This patient is critically ill with multiple organ system failure which requires frequent high CRITICAL CARE Performed by: Leita SAUNDERS Vonnie Spagnolo   Total critical care time: 32 minutes  Critical care time was exclusive of separately billable procedures and treating other patients.  Critical care was necessary to treat or prevent imminent or life-threatening deterioration.  Critical care was time spent personally by me on the following activities: development of treatment plan with patient and/or surrogate as well as nursing, discussions with consultants, evaluation of patient's response to treatment, examination of patient, obtaining history from patient or surrogate, ordering and performing treatments and interventions, ordering and review of laboratory studies, ordering and review of radiographic studies, pulse oximetry and re-evaluation of patient's condition.   Leita SAUNDERS Merlean Pizzini, PA-C [facility] See Amion for pager If no response to pager , please call [phone] until 7pm After 7:00 pm call Elink  [phone]

## 2024-02-20 NOTE — Progress Notes (Deleted)
 PHARMACY - ANTICOAGULATION  Pharmacy Consult for heparin  Indication: atrial fibrillation Brief A/P: Heparin  level subtherapeutic Increase Heparin  rate  Allergies[1]  Patient Measurements: Height: 5' 4 (162.6 cm) Weight: 71.8 kg (158 lb 4.6 oz) IBW/kg (Calculated) : 54.7 HEPARIN  DW (KG): 66.5  Vital Signs: Temp: 98.8 F (37.1 C) (12/17 0500) Temp Source: Bladder (12/17 0015) BP: 109/50 (12/17 0500) Pulse Rate: 89 (12/17 0500)  Labs: Recent Labs    02/18/24 1451 02/18/24 1658 02/19/24 0500 02/19/24 0524 02/19/24 1654 02/20/24 0400  HGB 11.8*  --   --  11.6*  --  10.9*  HCT 37.3  --   --  36.7  --  34.4*  PLT 59*  --   --  58*  --  67*  HEPARINUNFRC  --   --   --  0.10* 0.12* 0.17*  CREATININE  --  0.81 0.83  --  1.25*  --     Estimated Creatinine Clearance: 41.8 mL/min (A) (by C-G formula based on SCr of 1.25 mg/dL (H)).  Assessment: 68 y.o. female with s/p MV repair and redo on 12/5 and ECMO 12/6-12/8 with atrial fibrillation for heparin    Goal of Therapy:  Heparin  level 0.3-0.5 units/mL Monitor platelets by anticoagulation protocol: Yes   Plan:  Increase Heparin  900 units/hr Check heparin  level in 8 hours.  Cathlyn Arrant, PharmD, BCPS 02/20/2024  5:16 AM       [1]  Allergies Allergen Reactions   Codeine Anaphylaxis and Nausea And Vomiting   Bactoshield Chg [Chlorhexidine  Gluconate] Dermatitis   Chlorhexidine  Dermatitis and Rash   Red Dye #40 (Allura Red) Nausea Only

## 2024-02-21 ENCOUNTER — Ambulatory Visit (HOSPITAL_COMMUNITY)

## 2024-02-21 DIAGNOSIS — N183 Chronic kidney disease, stage 3 unspecified: Secondary | ICD-10-CM | POA: Diagnosis not present

## 2024-02-21 DIAGNOSIS — J9601 Acute respiratory failure with hypoxia: Secondary | ICD-10-CM | POA: Diagnosis not present

## 2024-02-21 DIAGNOSIS — B377 Candidal sepsis: Secondary | ICD-10-CM | POA: Diagnosis not present

## 2024-02-21 DIAGNOSIS — N179 Acute kidney failure, unspecified: Secondary | ICD-10-CM | POA: Diagnosis not present

## 2024-02-21 LAB — RENAL FUNCTION PANEL
Albumin: 3.2 g/dL — ABNORMAL LOW (ref 3.5–5.0)
Albumin: 3.4 g/dL — ABNORMAL LOW (ref 3.5–5.0)
Anion gap: 13 (ref 5–15)
Anion gap: 14 (ref 5–15)
BUN: 65 mg/dL — ABNORMAL HIGH (ref 8–23)
BUN: 75 mg/dL — ABNORMAL HIGH (ref 8–23)
CO2: 25 mmol/L (ref 22–32)
CO2: 23 mmol/L (ref 22–32)
Calcium: 8.7 mg/dL — ABNORMAL LOW (ref 8.9–10.3)
Calcium: 9.1 mg/dL (ref 8.9–10.3)
Chloride: 98 mmol/L (ref 98–111)
Chloride: 97 mmol/L — ABNORMAL LOW (ref 98–111)
Creatinine, Ser: 2.02 mg/dL — ABNORMAL HIGH (ref 0.44–1.00)
Creatinine, Ser: 2.35 mg/dL — ABNORMAL HIGH (ref 0.44–1.00)
GFR, Estimated: 26 mL/min — ABNORMAL LOW (ref >60)
GFR, Estimated: 22 mL/min — ABNORMAL LOW (ref >60)
Glucose, Bld: 201 mg/dL — ABNORMAL HIGH (ref 70–99)
Glucose, Bld: 137 mg/dL — ABNORMAL HIGH (ref 70–99)
Phosphorus: 4.8 mg/dL — ABNORMAL HIGH (ref 2.5–4.6)
Phosphorus: 5.1 mg/dL — ABNORMAL HIGH (ref 2.5–4.6)
Potassium: 4.8 mmol/L (ref 3.5–5.1)
Potassium: 4.9 mmol/L (ref 3.5–5.1)
Sodium: 136 mmol/L (ref 135–145)
Sodium: 134 mmol/L — ABNORMAL LOW (ref 135–145)

## 2024-02-21 LAB — CBC
HCT: 33.1 % — ABNORMAL LOW (ref 36.0–46.0)
HCT: 32.8 % — ABNORMAL LOW (ref 36.0–46.0)
Hemoglobin: 10.5 g/dL — ABNORMAL LOW (ref 12.0–15.0)
Hemoglobin: 10.3 g/dL — ABNORMAL LOW (ref 12.0–15.0)
MCH: 31.6 pg (ref 26.0–34.0)
MCH: 31.1 pg (ref 26.0–34.0)
MCHC: 31.7 g/dL (ref 30.0–36.0)
MCHC: 31.4 g/dL (ref 30.0–36.0)
MCV: 99.7 fL (ref 80.0–100.0)
MCV: 99.1 fL (ref 80.0–100.0)
Platelets: 88 K/uL — ABNORMAL LOW (ref 150–400)
Platelets: 96 K/uL — ABNORMAL LOW (ref 150–400)
RBC: 3.32 MIL/uL — ABNORMAL LOW (ref 3.87–5.11)
RBC: 3.31 MIL/uL — ABNORMAL LOW (ref 3.87–5.11)
RDW: 20.2 % — ABNORMAL HIGH (ref 11.5–15.5)
RDW: 20.3 % — ABNORMAL HIGH (ref 11.5–15.5)
WBC: 8.4 K/uL (ref 4.0–10.5)
WBC: 8.6 K/uL (ref 4.0–10.5)
nRBC: 0 % (ref 0.0–0.2)
nRBC: 0.5 % — ABNORMAL HIGH (ref 0.0–0.2)

## 2024-02-21 LAB — GLUCOSE, CAPILLARY
Glucose-Capillary: 100 mg/dL — ABNORMAL HIGH (ref 70–99)
Glucose-Capillary: 114 mg/dL — ABNORMAL HIGH (ref 70–99)
Glucose-Capillary: 120 mg/dL — ABNORMAL HIGH (ref 70–99)
Glucose-Capillary: 131 mg/dL — ABNORMAL HIGH (ref 70–99)
Glucose-Capillary: 138 mg/dL — ABNORMAL HIGH (ref 70–99)
Glucose-Capillary: 166 mg/dL — ABNORMAL HIGH (ref 70–99)

## 2024-02-21 LAB — COOXEMETRY PANEL
Carboxyhemoglobin: 1.9 % — ABNORMAL HIGH (ref 0.5–1.5)
Methemoglobin: <0.7 % (ref 0.0–1.5)
O2 Saturation: 57.8 %
Total hemoglobin: 10.8 g/dL — ABNORMAL LOW (ref 12.0–16.0)

## 2024-02-21 LAB — HEPATITIS B SURFACE ANTIBODY, QUANTITATIVE: Hep B S AB Quant (Post): 358 m[IU]/mL

## 2024-02-21 LAB — HEPARIN LEVEL (UNFRACTIONATED): Heparin Unfractionated: 0.33 [IU]/mL (ref 0.30–0.70)

## 2024-02-21 LAB — MAGNESIUM: Magnesium: 2.4 mg/dL (ref 1.7–2.4)

## 2024-02-21 MED ORDER — AMIODARONE HCL 100 MG PO TABS
400.0000 mg | ORAL_TABLET | Freq: Two times a day (BID) | ORAL | Status: DC
  Filled 2024-02-21: qty 4

## 2024-02-21 MED ORDER — ANTICOAGULANT SODIUM CITRATE 4% (200MG/5ML) IV SOLN: 5.0000 mL | Status: DC | PRN

## 2024-02-21 MED ORDER — PENTAFLUOROPROP-TETRAFLUOROETH EX AERO: 1.0000 | INHALATION_SPRAY | CUTANEOUS | Status: DC | PRN

## 2024-02-21 MED ORDER — METOLAZONE 5 MG PO TABS
5.0000 mg | ORAL_TABLET | Freq: Once | ORAL | Status: AC
  Administered 2024-02-21: 15:00:00 5 mg
  Filled 2024-02-21: qty 2

## 2024-02-21 MED ORDER — AMIODARONE HCL 200 MG PO TABS
400.0000 mg | ORAL_TABLET | Freq: Two times a day (BID) | ORAL | Status: DC
  Administered 2024-02-21 – 2024-02-26 (×12): 400 mg
  Filled 2024-02-21: qty 2
  Filled 2024-02-21: qty 4
  Filled 2024-02-21: qty 2
  Filled 2024-02-21 (×5): qty 4
  Filled 2024-02-21: qty 2
  Filled 2024-02-21 (×2): qty 4
  Filled 2024-02-21: qty 2
  Filled 2024-02-21 (×3): qty 4

## 2024-02-21 MED ORDER — VITAL 1.5 CAL PO LIQD
1000.0000 mL | ORAL | Status: DC
  Administered 2024-02-21: 20:00:00 1000 mL

## 2024-02-21 MED ORDER — ALTEPLASE 2 MG IJ SOLR: 2.0000 mg | Freq: Once | INTRAMUSCULAR | Status: DC | PRN

## 2024-02-21 MED ORDER — CHLOROTHIAZIDE SODIUM 500 MG IV SOLR: 500.0000 mg | Freq: Once | INTRAVENOUS | Status: DC

## 2024-02-21 MED ORDER — LIDOCAINE-PRILOCAINE 2.5-2.5 % EX CREA: 1.0000 | TOPICAL_CREAM | CUTANEOUS | Status: DC | PRN

## 2024-02-21 MED ORDER — HEPARIN SODIUM (PORCINE) 1000 UNIT/ML IJ SOLN
INTRAMUSCULAR | Status: AC
  Filled 2024-02-21: qty 4

## 2024-02-21 MED ORDER — FUROSEMIDE 10 MG/ML IJ SOLN
120.0000 mg | Freq: Two times a day (BID) | INTRAVENOUS | Status: DC
  Administered 2024-02-21: 09:00:00 120 mg via INTRAVENOUS
  Filled 2024-02-21 (×2): qty 12

## 2024-02-21 MED ORDER — LIDOCAINE HCL (PF) 1 % IJ SOLN: 5.0000 mL | INTRAMUSCULAR | Status: DC | PRN

## 2024-02-21 MED ORDER — PANTOPRAZOLE SODIUM 40 MG PO TBEC
40.0000 mg | DELAYED_RELEASE_TABLET | Freq: Every day | ORAL | Status: DC
  Administered 2024-02-21 – 2024-03-10 (×17): 40 mg via ORAL
  Filled 2024-02-21 (×12): qty 1

## 2024-02-21 MED ORDER — HEPARIN SODIUM (PORCINE) 1000 UNIT/ML DIALYSIS
1000.0000 [IU] | INTRAMUSCULAR | Status: DC | PRN
  Administered 2024-02-21: 20:00:00 2800 [IU]
  Administered 2024-02-29: 4200 [IU]
  Administered 2024-03-07: 1000 [IU]
  Administered 2024-03-10: 4200 [IU]
  Filled 2024-02-21 (×3): qty 1

## 2024-02-21 MED ORDER — FUROSEMIDE 10 MG/ML IJ SOLN
200.0000 mg | Freq: Once | INTRAMUSCULAR | Status: AC
  Administered 2024-02-21: 16:00:00 200 mg via INTRAVENOUS
  Filled 2024-02-21: qty 20

## 2024-02-21 NOTE — Progress Notes (Signed)
 12 Days Post-Op Procedures (LRB): ECMO CANNULATION (N/A) Subjective: No complaints this AM  Objective: Vital signs in last 24 hours: Temp:  [98.6 F (37 C)-99.1 F (37.3 C)] 98.8 F (37.1 C) (12/18 0700) Pulse Rate:  [55-82] 74 (12/18 0700) Cardiac Rhythm: Atrial fibrillation;Junctional rhythm (12/18 0400) Resp:  [13-32] 32 (12/18 0700) BP: (91-145)/(47-92) 124/86 (12/18 0700) SpO2:  [89 %-98 %] 95 % (12/18 0700) Weight:  [75.5 kg] 75.5 kg (12/18 0500)  Hemodynamic parameters for last 24 hours: CVP:  [9 mmHg-11 mmHg] 10 mmHg  Intake/Output from previous day: 12/17 0701 - 12/18 0700 In: 2550.7 [P.O.:240; I.V.:712; NG/GT:1286.7; IV Piggyback:312] Out: 900 [Urine:900] Intake/Output this shift: No intake/output data recorded.  General appearance: alert, cooperative, and no distress Neurologic: intact Heart: irregularly irregular rhythm Lungs: diminished breath sounds bibasilar Abdomen: normal findings: soft, non-tender Wound: intact  Lab Results: Recent Labs    02/20/24 0400 02/21/24 0435  WBC 10.4 8.4  HGB 10.9* 10.5*  HCT 34.4* 33.1*  PLT 67* 88*   BMET:  Recent Labs    02/20/24 0400 02/21/24 0435  NA 138 136  K 4.9 4.8  CL 103 98  CO2 25 25  GLUCOSE 108* 201*  BUN 51* 65*  CREATININE 1.58* 2.02*  CALCIUM  8.9 8.7*    PT/INR: No results for input(s): LABPROT, INR in the last 72 hours. ABG    Component Value Date/Time   PHART 7.340 (L) 02/13/2024 1618   HCO3 18.0 (L) 02/13/2024 1618   TCO2 19 (L) 02/13/2024 1618   ACIDBASEDEF 7.0 (H) 02/13/2024 1618   O2SAT 57.8 02/21/2024 0435   CBG (last 3)  Recent Labs    02/20/24 2334 02/21/24 0426 02/21/24 0750  GLUCAP 159* 166* 138*    Assessment/Plan: S/P Procedures (LRB): ECMO CANNULATION (N/A) Slowly progressing NEURO- intact CV- in rate controlled A fib on amiodarone   Anticoagulated with IV heparin - can change to PO  Co-ox 58 on dobutamine  at 5 RESP- VDRF secondary to enterobacter  pneumonia- resolved  IS ID- Diflucan  for candida sepsis, endocarditis RENAL- UO picked up yesterday evening with diuretics  Creatinine up to 2.02, BUN up to 65  Will d/w team whether to do HD or give another day ENDO- CBG mildly elevated GI nutritional status  improved albumin  3.2  On tube feedings  Passed swallow eval- advance diet and transtition Deconditioning- severe, PT/OT  LOS: 50 days    Tracy Baker 02/21/2024

## 2024-02-21 NOTE — Progress Notes (Signed)
 Nutrition Follow-up  DOCUMENTATION CODES:   Severe malnutrition in context of acute illness/injury  INTERVENTION:   Pt remains very weak and tired which appears to be barrier to eating; recommend calorie count to assess PO intake with decreased tube feeds. Do not recommend pulling Cortrak until intake can be fully assessed  Continue Tube Feeding via Cortrak: tube feeds decreased to promote PO intake Vital 1.5 with goal of 30 ml/hr Pro-Source TF20 60 mL to daily  TF at goal provides 68 g of protein (meets 68% protein needs), 1080 kcals (meets 57% calorie needs), 550 mL of free water   Banatrol TF 60 mL BID via tube as stool bulking agent- each packet provides 5g soluble fiber  Encouraged adequate intake of meals to help promote PO intake Initiated calorie count to monitor intake and assess if Cortrak can be removed  NUTRITION DIAGNOSIS:   Severe Malnutrition related to acute illness as evidenced by moderate muscle depletion, mild muscle depletion, energy intake < or equal to 50% for > or equal to 5 days.  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 PMH includes nephrolithiasis s/p ureteral stent and lithotripsy  12/01  Epistaxis requiring bilateral nasal packing by ENT; N/V noted; refusing antiemetic 12/02 RHC: sig reduced cardiac index likely d/t MR; normal filling pressures noted 12/05 NPO, OR: Redo MVR 12/06 Shock post op (R predominant)-significant pressor requirements (Levo at 34, Vaso at 0.04, Epi at 10, DBA at 5,  milrinone  at 0.375); Cath Lab for Protek Duo Cannulation 12/08 Protek Duo De-Cannulation, Trickle TF initiated 12/10 Lasix  gtt initiated 12/12 Corrak placed, TF increased to 40 ml/hr 12/13 CRRT initiated  12/15 extubated 12/16 CRRT stopped 12/17 diet advanced to regular   Spoke with pt who had just finished working with OT. Pt visibly exhausted following therapy and was struggling to answer questions due to fatigue. Pt reports low appetite and fatigue with PO meals. Discussed that tube feeds were decreased to half rate to encourage PO intake but pt continues to not want to eat. Discussed starting calorie count and encouraged increasing intake during the day. No meal documentation since in chart since diet was advanced.   Continue banatrol for diarrhea.  Admit weight: 67.3 kg  Current weight: 75.5 kg Mild pitting edema BUE + BLE   Intake/Output Summary (Last 24 hours) at 02/21/2024 1111 Last data filed at 02/21/2024 0900 Gross per 24 hour  Intake 2272.17 ml  Output 865 ml  Net 1407.17 ml   Net IO Since Admission: -1,817 mL [02/21/24 1111]  Drains/Lines: PICC double lumen R basilic HD triple lumen (non tunneled) Urethral Cath: UOP 900 ml x 24 h Cortrak gastric  Nutritionally Relevant Medications: Scheduled Meds:  feeding supplement (PROSource TF20)  60 mL Per Tube Daily   fiber supplement (BANATROL TF)  60 mL Per Tube BID   insulin  aspart  0-9 Units Subcutaneous Q4H   insulin  glargine  15 Units Subcutaneous Daily   mirtazapine  15 mg     pantoprazole  (PROTONIX ) IV  40 mg Intravenous QHS  Continuous Infusions:  DOBUTamine  3 mcg/kg/min (02/21/24 0900)   feeding supplement (VITAL 1.5 CAL) 30 mL/hr at 02/21/24 0900   fluconazole  (DIFLUCAN ) IV 400 mg (02/21/24 0950)   furosemide  62 mL/hr at 02/21/24 0900   heparin  1,000 Units/hr (02/21/24 0900)   promethazine  (PHENERGAN ) injection (IM or IVPB) Stopped (02/19/24 2145)     Labs Reviewed: BUN 65 Creatinine 2.02 Phos 4.8  (high) CBG ranges from 138-187 mg/dL over the last 24 hours HgbA1c 5.4   Diet Order:   Diet Order             Diet regular Room service appropriate? Yes with Assist; Fluid consistency: Thin  Diet effective now                   EDUCATION NEEDS:   Education needs have been addressed  Skin:  Skin Assessment: Skin Integrity Issues: Skin Integrity Issues:: DTI DTI: buttocks Incisions: chest (closed)  Last BM:  12/17 type 3 and 6  Height:   Ht Readings from Last 1 Encounters:  02/15/24 5' 4 (1.626 m)    Weight:   Wt Readings from Last 1 Encounters:  02/21/24 75.5 kg    Ideal Body Weight:  54.5 kg  BMI:  Body mass index is 28.57 kg/m.  Estimated Nutritional Needs:   Kcal:  1900-2100 kcals  Protein:  100-115g  Fluid:  1.5 L   Josette Glance, MS, RDN, LDN Clinical Dietitian I Please reach out via secure chat

## 2024-02-21 NOTE — Progress Notes (Addendum)
 [facility] Progress Note    Assessment/ Plan:   AKI -b/l Cr 0.4-0.8. 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 CRRT started 12/13 to help offload volume and given encephalopathy/uremia. Was unable to wean from ventilator. Now extubated 12/15. Stopped CRRT 12/16. Failed lasix  challenge 12/16. -Did marginally better with lasix  120mg  BID yesterday albeit not robust in regards to urine output. Will repeat lasix  120mg  BID today. Hold HD today given that we are waiting to see any semblance of further renal recovery, will reassess for tomorrow. If certain that she is going to require further HD, then will involve IR for [facility] placement -Avoid nephrotoxic medications including NSAIDs and iodinated intravenous contrast exposure unless the latter is absolutely indicated.  Preferred narcotic agents for pain control are hydromorphone , fentanyl , and methadone . Morphine should not be used. Avoid Baclofen and avoid oral sodium phosphate  and magnesium  citrate based laxatives / bowel preps. Continue strict Input and Output monitoring. 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 On cefepime , fluconazole ,  -on dobuatmine -lasix  as above  AHRF -extubated on 12/15 -currently stable  Anemia -transfuse PRN for Hgb <7 -avoid IV Fe -Hgb 10.5  Discussed with HF, CCM.  [name], MD [facility]  Addendum: not much urine made with lasix . Informed by ahf that CVPs are rising. Therefore, will plan for HD today to stay ahead of her volume. Orders placed. Discussed with KDU and RN  Subjective:   Patient seen and examined in room. S/p lasix  120mg  BID yesterday, UOP only ~900cc She reports feeling well, denies any chest pain, SOB, N/V, dysgeusia, loss of appetite.   Objective:   BP  124/86   Pulse 74   Temp 98.8 F (37.1 C)   Resp (!) 32   Ht 5' 4 (1.626 m)   Wt 75.5 kg   SpO2 95%   BMI 28.57 kg/m   Intake/Output Summary (Last 24 hours) at 02/21/2024 0818 Last data filed at 02/21/2024 0700 Gross per 24 hour  Intake 2420.03 ml  Output 875 ml  Net 1545.03 ml   Weight change: 2 kg  Physical Exam: Gen: ill/tired appearing, NAD CVS: RRR Resp: decreased breath sounds bibasilar, unlabored, normal wob Abd: soft Ext: trace to 1+ edema b/l LEs Neuro: awake, alert Dialysis access: LIJ temp HD cath  Imaging: DG CHEST PORT 1 VIEW Result Date: 02/20/2024 CLINICAL DATA:  Cough. EXAM: PORTABLE CHEST 1 VIEW COMPARISON:  02/19/2024 FINDINGS: Postsurgical changes in the heart. Left jugular dialysis catheter tip at the superior cavoatrial junction. Right arm PICC line tip near the superior cavoatrial junction. Heart size is grossly stable. Increased densities in the right lower lung. Blunting at the right costophrenic angle and cannot exclude right pleural fluid. Left lung is stable without focal disease. Negative for a pneumothorax. Feeding tube extends down the esophagus and the tip is beyond the image. IMPRESSION: 1. Increased densities in right lower lung. Findings are concerning for airspace disease or infection. Possible small right pleural effusion. 2. Support apparatuses as described. Negative for pneumothorax. Electronically Signed   By: Juliene Balder M.D.   On: 02/20/2024 12:14    Labs: BMET Recent Labs  Lab 02/17/24 1814 02/18/24 0422 02/18/24 1658 02/19/24 0500 02/19/24 1654 02/20/24 0400 02/21/24 0435  NA 140 138 137 139 138 138 136  K 4.7 4.3 4.6 4.4 4.8 4.9 4.8  CL 105 102 100 102 102 103 98  CO2 22 28 28 27 26 25 25   GLUCOSE 181* 149* 103* 149* 100* 108* 201*  BUN 42* 30* 30* 24* 40* 51* 65*  CREATININE 1.00 0.82 0.81 0.83 1.25* 1.58* 2.02*  CALCIUM  8.4* 8.2* 8.4* 8.7* 8.9 8.9 8.7*  PHOS 2.7 2.4* 1.9* 3.8 3.4 4.0 4.8*   CBC Recent Labs  Lab  02/18/24 1451 02/19/24 0524 02/20/24 0400 02/21/24 0435  WBC 13.3* 12.1* 10.4 8.4  HGB 11.8* 11.6* 10.9* 10.5*  HCT 37.3 36.7 34.4* 33.1*  MCV 100.3* 99.7 99.4 99.7  PLT 59* 58* 67* 88*    Medications:     amiodarone   400 mg Oral BID   clonazePAM   0.5 mg Per Tube QHS   feeding supplement (PROSource TF20)  60 mL Per Tube Daily   fiber supplement (BANATROL TF)  60 mL Per Tube BID   Gerhardt's butt cream   Topical BID   insulin  aspart  0-9 Units Subcutaneous Q4H   insulin  glargine  15 Units Subcutaneous Daily   mirtazapine   15 mg Per Tube QHS   mouth rinse  15 mL Mouth Rinse 4 times per day   pantoprazole  (PROTONIX ) IV  40 mg Intravenous QHS   QUEtiapine   25 mg Per Tube BID   scopolamine   1 patch Transdermal Q72H   sodium chloride  flush  10-40 mL Intracatheter Q12H      [name], MD [facility] 02/21/2024, 8:18 AM

## 2024-02-21 NOTE — Progress Notes (Signed)
 EVENING ROUNDS NOTE :     [address] [place] [address]             [phone]               12 Days Post-Op Procedures (LRB): ECMO CANNULATION (N/A)   Total Length of Stay:  LOS: 50 days  Events:   No events Stable day    BP 116/69   Pulse 80   Temp 97.7 F (36.5 C)   Resp 14   Ht 5' 4 (1.626 m)   Wt 76 kg   SpO2 95%   BMI 28.76 kg/m   CVP:  [10 mmHg-13 mmHg] 13 mmHg      anticoagulant sodium citrate      DOBUTamine  3 mcg/kg/min (02/21/24 1800)   feeding supplement (VITAL 1.5 CAL) 1,000 mL (02/21/24 2028)   fluconazole  (DIFLUCAN ) IV Stopped (02/21/24 1151)   heparin  1,000 Units/hr (02/21/24 1800)   promethazine  (PHENERGAN ) injection (IM or IVPB) Stopped (02/19/24 2145)    I/O last 3 completed shifts: In: 3885.1 [P.O.:440; I.V.:874.4; NG/GT:1926.7; IV Piggyback:644] Out: 1345 [Urine:1345]      Latest Ref Rng & Units 02/21/2024    4:27 PM 02/21/2024    4:35 AM 02/20/2024    4:00 AM  CBC  WBC 4.0 - 10.5 K/uL 8.6  8.4  10.4   Hemoglobin 12.0 - 15.0 g/dL 89.6  89.4  89.0   Hematocrit 36.0 - 46.0 % 32.8  33.1  34.4   Platelets 150 - 400 K/uL 96  88  67        Latest Ref Rng & Units 02/21/2024    4:27 PM 02/21/2024    4:35 AM 02/20/2024    4:00 AM  BMP  Glucose 70 - 99 mg/dL 862  798  891   BUN 8 - 23 mg/dL 75  65  51   Creatinine 0.44 - 1.00 mg/dL 7.64  7.97  8.41   Sodium 135 - 145 mmol/L 134  136  138   Potassium 3.5 - 5.1 mmol/L 4.9  4.8  4.9   Chloride 98 - 111 mmol/L 97  98  103   CO2 22 - 32 mmol/L 23  25  25    Calcium  8.9 - 10.3 mg/dL 9.1  8.7  8.9     ABG    Component Value Date/Time   PHART 7.340 (L) 02/13/2024 1618   PCO2ART 33.4 02/13/2024 1618   PO2ART 89 02/13/2024 1618   HCO3 18.0 (L) 02/13/2024 1618   TCO2 19 (L) 02/13/2024 1618   ACIDBASEDEF 7.0 (H) 02/13/2024 1618   O2SAT 57.8 02/21/2024 0435       Linnie Rayas, MD 02/21/2024 10:22 PM

## 2024-02-21 NOTE — Progress Notes (Signed)
 Occupational Therapy Treatment Patient Details Name: Tracy Baker MRN: [id number] DOB: 1955/12/29 Today's Date: 02/21/2024   History of present illness 68 y.o. female adm 12/29/2023 after leaving AMA from [facility] (where she was on vacation due to lack of formal transfer) where she was being treated for C. Albicans candidemia. 10/28 TEE with mitral valve vegetation. 11/3 teeth Extractions. 11/6 Mitral valve repair. 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 Extubated 12/15. PMHx: thyroid  disease.   OT comments  Pt progressing towards goals. Progressed to complete stand pivot transfer with max +2 assist. Pt demo'd improved actibity tolerance completing x3 STSs in session for pericare. Pt limited by decreased strength and balance. Continue to recommend >3 hours of skilled rehab daily to optimize independence levels. Will continue to follow acutely.       If plan is discharge home, recommend the following:  Two people to help with walking and/or transfers;Two people to help with bathing/dressing/bathroom;Assistance with cooking/housework;Assistance with feeding;Assist for transportation   Equipment Recommendations  Tub/shower seat    Recommendations for Other Services Rehab consult    Precautions / Restrictions Precautions Precautions: Fall;Sternal Precaution Booklet Issued: No Recall of Precautions/Restrictions: Impaired Precaution/Restrictions Comments: temp pacer, cortrak, foley Restrictions Weight Bearing Restrictions Per Provider Order: No Other Position/Activity Restrictions: Cardiac sternal precautions       Mobility Bed Mobility Overal bed mobility: Needs Assistance Bed Mobility: Rolling, Sidelying to Sit Rolling: Max assist Sidelying to sit: Max assist, +2 for physical assistance, +2 for safety/equipment, HOB elevated       General bed mobility comments: Tactil cues and assist to manage BLEs and  trunk    Transfers Overall transfer level: Needs assistance Equipment used: 2 person hand held assist Transfers: Sit to/from Stand, Bed to chair/wheelchair/BSC Sit to Stand: Mod assist, +2 safety/equipment Stand pivot transfers: Max assist, +2 safety/equipment         General transfer comment: Mod assist to stand x3 trials for pericare. Pt not sequencing steps well, flexed trunk posture. Required +2 HH assist to stand pivot to chair     Balance Overall balance assessment: Needs assistance Sitting-balance support: No upper extremity supported, Feet unsupported Sitting balance-Leahy Scale: Poor Sitting balance - Comments: Min to CGA for balance, posterior lean Postural control: Posterior lean Standing balance support: Bilateral upper extremity supported, During functional activity Standing balance-Leahy Scale: Poor Standing balance comment: Reliant on external support                           ADL either performed or assessed with clinical judgement   ADL Overall ADL's : Needs assistance/impaired         Toilet Transfer: Stand-pivot;Maximal assistance;+2 for physical assistance;+2 for safety/equipment Toilet Transfer Details (indicate cue type and reason): Max +2 with stand pivot HH assist Toileting- Clothing Manipulation and Hygiene: Total assistance;+2 for physical assistance;+2 for safety/equipment;Sit to/from stand Toileting - Clothing Manipulation Details (indicate cue type and reason): Total +2 assist     Functional mobility during ADLs: Maximal assistance;+2 for safety/equipment;+2 for physical assistance      Extremity/Trunk Assessment Upper Extremity Assessment Upper Extremity Assessment: Generalized weakness   Lower Extremity Assessment Lower Extremity Assessment: Defer to PT evaluation        Vision   Vision Assessment?: No apparent visual deficits         Communication Communication Communication: Impaired   Cognition Arousal:  Alert Behavior During Therapy: Flat affect Cognition:  Cognition impaired       Memory impairment (select all impairments): Short-term memory   Executive functioning impairment (select all impairments): Problem solving, Sequencing OT - Cognition Comments: minimal verbalizations, intermittently following commands                 Following commands: Impaired Following commands impaired: Only follows one step commands consistently, Follows one step commands with increased time      Cueing   Cueing Techniques: Verbal cues, Tactile cues, Gestural cues        General Comments VSS on RA    Pertinent Vitals/ Pain       Pain Assessment Pain Assessment: Faces Faces Pain Scale: Hurts little more Pain Location: sternum Pain Descriptors / Indicators: Sore Pain Intervention(s): Monitored during session   Frequency  Min 2X/week        Progress Toward Goals  OT Goals(current goals can now be found in the care plan section)  Progress towards OT goals: Progressing toward goals     Plan      AM-PAC OT 6 Clicks Daily Activity     Outcome Measure   Help from another person eating meals?: Total Help from another person taking care of personal grooming?: Total Help from another person toileting, which includes using toliet, bedpan, or urinal?: Total Help from another person bathing (including washing, rinsing, drying)?: A Lot Help from another person to put on and taking off regular upper body clothing?: A Lot Help from another person to put on and taking off regular lower body clothing?: Total 6 Click Score: 8    End of Session Equipment Utilized During Treatment: Rolling walker (2 wheels)  OT Visit Diagnosis: Unsteadiness on feet (R26.81);Muscle weakness (generalized) (M62.81)   Activity Tolerance Patient tolerated treatment well   Patient Left in chair;with call bell/phone within reach;with chair alarm set   Nurse Communication Mobility status        Time:  9056-8969 OT Time Calculation (min): 47 min  Charges: OT General Charges $OT Visit: 1 Visit OT Treatments $Self Care/Home Management : 38-52 mins  Adrianne BROCKS, OT  Acute Rehabilitation Services Office [phone] Secure chat preferred   Adrianne GORMAN Savers 02/21/2024, 2:14 PM

## 2024-02-21 NOTE — Progress Notes (Signed)
 PHARMACY - ANTICOAGULATION CONSULT NOTE  Pharmacy Consult for heparin  Indication: atrial fibrillation  Allergies[1]  Patient Measurements: Height: 5' 4 (162.6 cm) Weight: 75.5 kg (166 lb 7.2 oz) IBW/kg (Calculated) : 54.7 HEPARIN  DW (KG): 66.5  Vital Signs: Temp: 98.8 F (37.1 C) (12/18 0700) Temp Source: Bladder (12/18 0400) BP: 124/86 (12/18 0700) Pulse Rate: 74 (12/18 0700)  Labs: Recent Labs    02/19/24 0524 02/19/24 1654 02/20/24 0400 02/20/24 1334 02/20/24 2114 02/21/24 0435 02/21/24 0634  HGB 11.6*  --  10.9*  --   --  10.5*  --   HCT 36.7  --  34.4*  --   --  33.1*  --   PLT 58*  --  67*  --   --  88*  --   HEPARINUNFRC 0.10* 0.12* 0.17* 0.27* 0.27*  --  0.33  CREATININE  --  1.25* 1.58*  --   --  2.02*  --     Estimated Creatinine Clearance: 26.5 mL/min (A) (by C-G formula based on SCr of 2.02 mg/dL (H)).   Medical History: Past Medical History:  Diagnosis Date   Medical history non-contributory    Thyroid  disease    Vertigo     Medications:  Scheduled:   clonazePAM   0.5 mg Per Tube QHS   feeding supplement (PROSource TF20)  60 mL Per Tube Daily   fiber supplement (BANATROL TF)  60 mL Per Tube BID   Gerhardt's butt cream   Topical BID   insulin  aspart  0-9 Units Subcutaneous Q4H   insulin  glargine  15 Units Subcutaneous Daily   mirtazapine   15 mg Per Tube QHS   mouth rinse  15 mL Mouth Rinse 4 times per day   pantoprazole  (PROTONIX ) IV  40 mg Intravenous QHS   QUEtiapine   25 mg Per Tube BID   scopolamine   1 patch Transdermal Q72H   sodium chloride  flush  10-40 mL Intracatheter Q12H    Assessment: Tracy Baker is a 88 YOF now s/p MV repair and redo on 12/5. Hospitalization complicated by acute RV failure with cardiogenic shock s/p Protek duo VV ECMO cannula. Patient also with atrial fibrillation throughout this hospitalization.  Discussed with CCM/HF team and decision made to start low-dose heparin  infusion 12/14. Pharmacy consulted for heparin   dosing.   02/21/24: Heparin  level 0.33 therapeutic on 1000 units/hr. Hgb 10.5 (stable), pltc 88 (trend up).  CRRT stopped 12/16, minimal UOP after Lasix  challenge yesterday, plan for repeat today.  Will need Eliquis  eventually pending [facility] plans.   Goal of Therapy:  Heparin  level 0.3-0.5 Monitor platelets by anticoagulation protocol: Yes   Plan:  Continue heparin  IV 950 units/hr  Monitor heparin  level, CBC, and s/sx of bleeding daily  Maurilio Fila, PharmD Clinical Pharmacist 02/21/2024  7:24 AM     [1]  Allergies Allergen Reactions   Codeine Anaphylaxis and Nausea And Vomiting   Bactoshield Chg [Chlorhexidine  Gluconate] Dermatitis   Chlorhexidine  Dermatitis and Rash   Red Dye #40 (Allura Red) Nausea Only

## 2024-02-21 NOTE — Progress Notes (Addendum)
° °  Inpatient Rehabilitation Admissions Coordinator   Patient denied previously for CIR admit on 11/26 by [facility] medicare for at that time, patient was CGA with ADLS and mobilization.We appealed and again denied. This was prior to most recent surgery. Patient and daughter, [name], made aware of those denials at the time. She has now had redo MVR on 12/5. I await further progress with therapy before re exploring possible CIR admit to demonstrate the tolerance for more intensive rehab prior to return home with family providing 24/7 supervision after a possible Cir admit. We will monitor her medical and therapy progress from a distance.  Heron Leavell, RN, MSN Rehab Admissions Coordinator [phone] 02/21/2024 2:38 PM

## 2024-02-21 NOTE — Progress Notes (Signed)
 NAME:  Tracy Baker, MRN:  [id number], DOB:  1955-06-21, LOS: 50 ADMISSION DATE:  12/28/2023, CONSULTATION DATE:  02/08/24 REFERRING MD:  Kerrin RAMAN., CHIEF COMPLAINT:  s/p MVR   History of Present Illness:  Tracy Baker is a 67 year old female with past medical history significant for nephrolithiasis s/p ureteral stent and lithotripsy at 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 Kerrin. PCCM consulted for ICU post-op vent management initially.  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 Kerrin 12/6: Overnight patient started requiring more vasopressor support, early morning she was on dobutamine  5, milrinone  0.375, epinephrine  at 10, vasopressin  0.04 and norepinephrine  34 mics, she is not making urine.  Cannulated with Protek duo for RV support,  12/7 bronch, broadened antibiotics 12/8 decannulated from VV ECMO after sweep and flow wean trials. Lasix  stopped. Restarted TF. 12/10 chest tubes out, off pressors. Aline removed. 12/13 started CRRT 12/15 extubated, weak, continued fevers remains on CRRT 12/16 CRRT off this AM, less delirious, LE dopplers neg 12/17 off CRRT, trial diuresis   Interim History / Subjective:  No events, slept a little better, nausea under control, no opiates needed Okay but not robust UOP   amiodarone  30 mg/hr (02/21/24 0700)   DOBUTamine  3 mcg/kg/min (02/21/24 0700)   feeding supplement (VITAL 1.5 CAL)     fluconazole  (DIFLUCAN ) IV Stopped (02/20/24 1423)   heparin  1,000 Units/hr (02/21/24 0700)   promethazine  (PHENERGAN ) injection (IM or IVPB) Stopped (02/19/24 2145)     Objective     Blood pressure 124/86, pulse 74, temperature 98.8 F (37.1 C), resp. rate (!) 32, height 5' 4 (1.626 m), weight 75.5 kg, SpO2 95%. CVP:  [9 mmHg-11 mmHg] 10 mmHg      Intake/Output Summary (Last 24 hours) at 02/21/2024 0721 Last data filed at 02/21/2024 0700 Gross per 24 hour  Intake 2550.69 ml  Output 900 ml  Net 1650.69 ml   Filed Weights   02/19/24 0500 02/20/24 0500 02/21/24 0500  Weight: 71.8 kg 73.5 kg 75.5 kg   No distress Sternotomy looks okay Amber urine in foley Aox3 Moves to command Abd soft 1+ LE edmea  Patient Lines/Drains/Airways Status     Active Line/Drains/Airways     Name Placement date Placement time Site Days   PICC Double Lumen 01/15/24 Right Basilic 35 cm 0 cm 01/15/24  8384  -- 37   Hemodialysis Catheter Left Internal jugular Triple lumen Temporary (Non-Tunneled) 02/16/24  1600  Internal jugular  5   Urethral Catheter Lauraine Holts, RN Temperature probe 14 Fr. 02/18/24  1422  Temperature probe  3   Small Bore Feeding Tube Left nare Marking at nare/corner of mouth 67 cm 02/15/24  1135  Left nare  6   Wound 01/16/24 0800 Traumatic Back Left;Upper 01/16/24  0800  Back  36   Wound 02/08/24 Surgical Closed Surgical Incision Sternum Mid 02/08/24  --  Sternum  13   Wound 02/12/24 1800 Pressure Injury Buttocks Right;Medial Deep Tissue Pressure Injury - Purple or maroon localized area of discolored intact skin or blood-filled blister due to damage of underlying soft tissue from pressure and/or shear. 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, with persistent severe mitral regurgitation and status post redo sternotomy and bioprosthetic MVR Previous candidemia Acute RV failure with cardiogenic shock status post Protek duo VV ECMO cannula on 12/6, decannulated 12/8 AKI on CKD stage IIIa, Cr plateau Hypoglycemia improved; now mild hyperglycemia on TF H/o prediabetes, A1c 6 Severe protein calorie malnutrition- now starting diet Hyperbilirubinemia; unclear etiology, stable  Deconditioning, prolonged course of illness leading up to this  Pressure ulcer on buttocks; skin breakdown from bandage on R neck Lasix  challenge and inotropes per AHF No urgent need for HD but may need in next day or two for volume purposes Prolonged fluconazole  course as outlined by ID Drop TF rate and encourage PO, hopeful to get NGT out soon Push mobility, PT/OT help appreciated, ?CIR candidate Multimodal pain, anxiety, nausea control as ordered Will follow while in ICU  Rolan Sharps MD PCCM

## 2024-02-21 NOTE — Progress Notes (Addendum)
 Patient ID: Tracy Baker, female   DOB: [birth year], 68 y.o.   MRN: [id number]     Advanced Heart Failure Rounding Note  Cardiologist: Lonni Cash, MD  HF Cardiologist: Dr. Zenaida  Chief Complaint: S/p MV Repair / MV  endocarditis   Patient Profile  Tracy Baker is a 68 y.o. female with hx of recent nephrolithiasis s/p ureteral stent and lithotripsy at [facility], [place] 10/10 with stent removal. 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 Post-op course c/b post cardiotomy shock, junctional bradycardia, afib w/ RVR and HAP.   Significant events:   12/2 RHC (on 0.125 of Milrinone ): RA 4, PA 42/13 (22), PCW 12 with Vwaves to 20, CO/CI TD 3.37/2.03, PAPi 7.5 12/5 S/P Redo MVR - Intra Op given  4UPRBCs, 2UPLTs, 2FFP, 1Cyro 12/6 Protek Duo cannulation 12/8 Decannulated.  Post-op echo showed EF 50%, RV mildly dilated and mildly dysfunctional, s/p bioprosthetic mitral valve (stable). 12/9 Diuresed with IV lasix  120 mg x1.  12/10 lasix  gtt started.  12/13 CVVH begun 12/15: extubated 12/16: CRRT stopped.  12/17: Started DBA wean  Subjective:    Co-ox 58% on 3 DBA with Fick CI 2.2.  CVP 16. 900 cc UOP last 24 hrs with IV lasix  120 BID. Weight trending back up.  She remains fluconazole  (plan for 6 weeks from OR, stop date 1/16), completed vanc + cefepime . Tmax 99.24F overnight.   Denies dyspnea. Doesn't have much appetite. Working with PT/OT but still very weak.   Objective:    Weight Range: 75.5 kg Body mass index is 28.57 kg/m.   Vital Signs:   Temp:  [98.6 F (37 C)-99.1 F (37.3 C)] 98.8 F (37.1 C) (12/18 0700) Pulse Rate:  [55-82] 74 (12/18 0700) Resp:  [13-32] 32 (12/18 0700) BP: (91-145)/(47-92) 124/86 (12/18 0700) SpO2:  [89 %-98 %] 95 % (12/18 0700) Weight:  [75.5 kg] 75.5 kg (12/18 0500) Last BM Date : 02/20/24  Weight change: Filed Weights   02/19/24 0500 02/20/24 0500 02/21/24 0500  Weight: 71.8 kg 73.5 kg 75.5 kg    Intake/Output:  Intake/Output Summary (Last 24 hours) at 02/21/2024 0751 Last data filed at 02/21/2024 0700 Gross per 24 hour  Intake 2550.69 ml  Output 900 ml  Net 1650.69 ml   Physical Exam   General:  Sitting up in bed. Weak appearing. Cor: JVP to jaw. Irregular rhythm. No murmurs. Lungs: clear Abdomen: soft, nontender, nondistended. Extremities: 1-2+ edema Neuro: alert & orientedx3. Affect flat   Telemetry   Afib w/ intermittent junctional rhythm, 70s  Labs    CBC Recent Labs    02/20/24 0400 02/21/24 0435  WBC 10.4 8.4  HGB 10.9* 10.5*  HCT 34.4* 33.1*  MCV 99.4 99.7  PLT 67* 88*   Basic Metabolic Panel Recent Labs    87/82/74 0400 02/21/24 0435  NA 138 136  K 4.9 4.8  CL 103 98  CO2 25 25  GLUCOSE 108* 201*  BUN 51* 65*  CREATININE 1.58* 2.02*  CALCIUM  8.9 8.7*  MG 2.5* 2.4  PHOS 4.0 4.8*   Liver Function Tests Recent Labs    02/20/24 0400 02/21/24 0435  AST 33  --   ALT 20  --   ALKPHOS 273*  --   BILITOT 1.1  --   PROT 5.7*  --   ALBUMIN  3.3* 3.2*   Medications:    Scheduled Medications:  clonazePAM   0.5 mg Per Tube QHS   feeding supplement (PROSource TF20)  60 mL Per Tube Daily  fiber supplement (BANATROL TF)  60 mL Per Tube BID   Gerhardt's butt cream   Topical BID   insulin  aspart  0-9 Units Subcutaneous Q4H   insulin  glargine  15 Units Subcutaneous Daily   mirtazapine   15 mg Per Tube QHS   mouth rinse  15 mL Mouth Rinse 4 times per day   pantoprazole  (PROTONIX ) IV  40 mg Intravenous QHS   QUEtiapine   25 mg Per Tube BID   scopolamine   1 patch Transdermal Q72H   sodium chloride  flush  10-40 mL Intracatheter Q12H    Infusions:  amiodarone  30 mg/hr (02/21/24 0700)   DOBUTamine  3 mcg/kg/min (02/21/24 0700)   feeding supplement (VITAL 1.5 CAL)     fluconazole  (DIFLUCAN ) IV Stopped (02/20/24 1423)   furosemide      heparin  1,000 Units/hr (02/21/24 0700)   promethazine  (PHENERGAN ) injection (IM or IVPB) Stopped (02/19/24  2145)    PRN Medications: albuterol , mouth rinse, polyethylene glycol, promethazine  (PHENERGAN ) injection (IM or IVPB), simethicone , sodium chloride  flush  Assessment/Plan   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 Kerrin for Candida endocarditis. Pre-op L/RHC 01/04/24 with no CAD and normal L/R heart pressures.  - Failed initial Milrinone  wean. Eventually weaned off, unfortunately restarted 12/1 for low output.  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 - Continue RV support with dobutamine , currently at 3 mcg/kg/min. Will continue today while waiting for renal recovery. - CVVH stopped 12/16. 900 cc UOP last 24 hrs with IV lasix  120 BID. Discussed with Nephrology today. Plan to repeat same dosing of IV lasix  today. May use metolazone  tomorrow if no significant change in UOP. Hold off on HD for now.   Acute hypoxic respiratory failure: Suspect component of infection/aspiration PNA given unilateral disease.  - Respiratory culture- Enterobacter, On fluconazole . Completed cefepime  and vanc. - Extubated 12/15   Coagulopathy/thrombocytopenia/ABLA anemia:  - Slowly improving, up to 88K today.  Suspect this is inflammatory due to critical illness.  - Continue heparin  gtt  Sick sinus syndrome - Initially with asystole underneath (as of 12/10) - Now with atrial fibrillation and intermittent junctional rhythm. accelerated junctional/atrial fibrillation.   Afib - Post operative - Continue heparin  gtt - Has received adequate amiodarone  load. Switch IV amiodarone  to po 400 mg BID  AKI on CKD 3b - Off  CVVH as of 12/16. Challenge with IV lasix  again today. See above.  - Mgmt per nephrology  Protein-calorie malnutrition - On TF, decrease today to promote po intake - SLP following, okay for PO diet 12/17  Epistaxis  - s/p bilateral nasal packing per ENT 12/1     Pressure Ulcer R Buttock, DTI - Q2 turns, offload  Left radial artery occluded - Left ulnar artery is patent.  - Continue heparin  gtt   CRITICAL CARE Performed by: COLLETTA SHAVER N   Total critical care time: 20 minutes  Critical care time was exclusive of separately billable procedures and treating other patients.  Critical care was necessary to treat or prevent imminent or life-threatening deterioration.  Critical care was time spent personally by me on the following activities: development of treatment plan with patient and/or surrogate as well as  nursing, discussions with consultants, evaluation of patient's response to treatment, examination of patient, obtaining history from patient or surrogate, ordering and performing treatments and interventions, ordering and review of laboratory studies, ordering and review of radiographic studies, pulse oximetry and re-evaluation of patient's condition.   FINCH, LINDSAY N 02/21/2024 7:51 AM  Patient seen with PA, I formulated the plan and agree with the above note.   UOP only 900 cc yesterday.  She remains on dobutamine  at 3, co-ox 58% equating to CI 2.2. Creatinine increased 1.58 => 2.02 off dialysis. She remains in rate-controlled atrial fibrillation on amiodarone  and heparin  gtt. Plts higher at 88K.  CVP rising, up to 16.   She is working with OT.   General: NAD Neck: JVP 16 cm, no thyromegaly or thyroid  nodule.  Lungs: Clear to auscultation bilaterally with normal respiratory effort. CV: Nondisplaced PMI.  Heart irregular S1/S2, no S3/S4, no murmur.  1+ edema to thighs.    Abdomen: Soft, nontender, no hepatosplenomegaly, no distention.  Skin: Intact without lesions  or rashes.  Neurologic: Alert and oriented x 3.  Psych: Normal affect. Extremities: No clubbing or cyanosis.  HEENT: Normal.   Off CVVH, CVP now up to 16.  She did not respond much to Lasix  IV yesterday, 900 cc UOP.  Will give Lasix  120 mg IV bid again today, but if she is not responding this morning, would favor going ahead with HD tonight to prevent her from getting too far behind with volume.     Co-ox 58% on dobutamine  3.  Continue for now for RV support while we await renal recovery.     She emains on fluconazole .    Platelets 58 => 67 => 88.  Thrombocytopenia predates heparin , discussed with pharmacy and think not due to HIT.  Continue heparin  gtt.  She is in atrial fibrillation with HR 80s on amiodarone , can transition amiodarone  to po today.  Holding off on cardioversion for now due to concern for bradycardia.   Very weak, need to mobilize.   CRITICAL CARE Performed by: Ezra Shuck  Total critical care time: 40 minutes  Critical care time was exclusive of separately billable procedures and treating other patients.  Critical care was necessary to treat or prevent imminent or life-threatening deterioration.  Critical care was time spent personally by me on the following activities: development of treatment plan with patient and/or surrogate as well as nursing, discussions with consultants, evaluation of patient's response to treatment, examination of patient, obtaining history from patient or surrogate, ordering and performing treatments and interventions, ordering and review of laboratory studies, ordering and review of radiographic studies, pulse oximetry and re-evaluation of patient's condition.  Ezra Shuck 02/21/2024 10:21 AM

## 2024-02-21 NOTE — TOC Progression Note (Signed)
 Transition of Care [facility]) - Progression Note    Patient Details  Name: Tracy Baker MRN: [id number] Date of Birth: [birth year]  [facility] CM/SW Contact  Arlana JINNY Nicholaus ISRA[place] Phone Number: [phone] 02/21/2024, 10:08 AM  Clinical Narrative:   10:06 AM- HF CSW spoke with the patients daughter [name] over the phone. [name] stated that they are interested in CIR and do not understand why they were previously declined. CSW explained most recent PT note. CSW explained that it could be beneficial if we have two plans in place before the patient is medically ready for dc. Plan A, [name] stated that the family would like for her to go to CIR and they have post dc family support at home (24 hour care). Plan B, patient would go to a SNF. [name] and patient agreeable to being faxed out in the [place] and [place] area. CSW will provide accepted bed offers to [name] for review.   HF CSW/CM will continue to following and monitor for dc readiness.     Expected Discharge Plan: Skilled Nursing Facility Barriers to Discharge: Continued Medical Work up               Expected Discharge Plan and Services   Discharge Planning Services: CM Consult Post Acute Care Choice: Skilled Nursing Facility Living arrangements for the past 2 months: Apartment                                       Social Drivers of Health (SDOH) Interventions SDOH Screenings   Food Insecurity: No Food Insecurity (12/29/2023)  Housing: Low Risk (12/29/2023)  Transportation Needs: No Transportation Needs (12/29/2023)  Utilities: Not At Risk (12/29/2023)  Alcohol Screen: Low Risk (06/08/2022)  Depression (PHQ2-9): Low Risk (11/08/2023)  Social Connections: Socially Isolated (12/29/2023)  Tobacco Use: High Risk (02/08/2024)    Readmission Risk Interventions     No data to display

## 2024-02-21 NOTE — Progress Notes (Signed)
 Speech Language Pathology Treatment: Dysphagia  Patient Details Name: Tracy Baker MRN: [id number] DOB: [birth year] Today's Date: 02/21/2024 Time: 8740-8690 SLP Time Calculation (min) (ACUTE ONLY): 10 min  Assessment / Plan / Recommendation Clinical Impression  Pt was sitting upright in the chair, fatigued and sore. She took in very limited volumes of mixed consistency solids and thin liquids but without overt s/s of aspiration. Mastication is mildly prolonged but she does clear her mouth completely. Eating/drinking appear very taxing for the pt but diet modifications are not recommended as current diet allows most variety from the menu. Suspect ongoing AMN may be necessary to supplement oral intake to allow time for endurance to improve. Given extent of deconditioning, will f/u at least briefly for ongoing assessment.    HPI HPI: 68 yo female admitted 10/25 after leaving AMA from [facility] where she was being treated for C. Albicans candidemia. TEE with mitral valve vegetation 10/28. Dental extractions 11/3. Mitral valve repair, ETT <24 hrs 11/6-11/7 (passed yale post-extubation). Redo sternotomy with MVR 12/5, developed R ventricular failure 12/6, requiring ECMO cannulation 12/6-12/8. Remained intubated post-operatively 12/5-12/15. CRRT 12/13-12/16. FEES 12/17 indicative of intact laryngeal sensation without penetration/aspiration of thin or nectar thick liquids and recommended a regular diet with thin liquids. PMH includes thyroid  disease      SLP Plan  Continue with current plan of care        Swallow Evaluation Recommendations   Recommendations: PO diet PO Diet Recommendation: Regular;Thin liquids (Level 0) Liquid Administration via: Cup;Straw Medication Administration: Whole meds with liquid Supervision: Staff to assist with self-feeding;Full supervision/cueing for swallowing strategies Postural changes: Position pt fully upright for meals Oral care recommendations: Oral care BID  (2x/day)     Recommendations                     Oral care BID   Frequent or constant Supervision/Assistance Dysphagia, pharyngeal phase (R13.13)     Continue with current plan of care     Damien Blumenthal, M.A., CCC-SLP Speech Language Pathology, Acute Rehabilitation Services  Secure Chat preferred [phone]   02/21/2024, 1:37 PM

## 2024-02-22 ENCOUNTER — Inpatient Hospital Stay (HOSPITAL_COMMUNITY)

## 2024-02-22 DIAGNOSIS — R739 Hyperglycemia, unspecified: Secondary | ICD-10-CM | POA: Diagnosis not present

## 2024-02-22 DIAGNOSIS — N179 Acute kidney failure, unspecified: Secondary | ICD-10-CM | POA: Diagnosis not present

## 2024-02-22 DIAGNOSIS — R570 Cardiogenic shock: Secondary | ICD-10-CM | POA: Diagnosis not present

## 2024-02-22 DIAGNOSIS — E43 Unspecified severe protein-calorie malnutrition: Secondary | ICD-10-CM | POA: Diagnosis not present

## 2024-02-22 DIAGNOSIS — I5021 Acute systolic (congestive) heart failure: Secondary | ICD-10-CM

## 2024-02-22 DIAGNOSIS — J9601 Acute respiratory failure with hypoxia: Secondary | ICD-10-CM | POA: Diagnosis not present

## 2024-02-22 DIAGNOSIS — N183 Chronic kidney disease, stage 3 unspecified: Secondary | ICD-10-CM | POA: Diagnosis not present

## 2024-02-22 DIAGNOSIS — N1831 Chronic kidney disease, stage 3a: Secondary | ICD-10-CM | POA: Diagnosis not present

## 2024-02-22 DIAGNOSIS — B377 Candidal sepsis: Secondary | ICD-10-CM | POA: Diagnosis not present

## 2024-02-22 LAB — GLUCOSE, CAPILLARY
Glucose-Capillary: 116 mg/dL — ABNORMAL HIGH (ref 70–99)
Glucose-Capillary: 125 mg/dL — ABNORMAL HIGH (ref 70–99)
Glucose-Capillary: 121 mg/dL — ABNORMAL HIGH (ref 70–99)
Glucose-Capillary: 144 mg/dL — ABNORMAL HIGH (ref 70–99)
Glucose-Capillary: 89 mg/dL (ref 70–99)
Glucose-Capillary: 153 mg/dL — ABNORMAL HIGH (ref 70–99)
Glucose-Capillary: 163 mg/dL — ABNORMAL HIGH (ref 70–99)

## 2024-02-22 LAB — RENAL FUNCTION PANEL
Albumin: 3.3 g/dL — ABNORMAL LOW (ref 3.5–5.0)
Anion gap: 12 (ref 5–15)
BUN: 47 mg/dL — ABNORMAL HIGH (ref 8–23)
CO2: 27 mmol/L (ref 22–32)
Calcium: 9.1 mg/dL (ref 8.9–10.3)
Chloride: 95 mmol/L — ABNORMAL LOW (ref 98–111)
Creatinine, Ser: 1.79 mg/dL — ABNORMAL HIGH (ref 0.44–1.00)
GFR, Estimated: 30 mL/min — ABNORMAL LOW
Glucose, Bld: 124 mg/dL — ABNORMAL HIGH (ref 70–99)
Phosphorus: 4 mg/dL (ref 2.5–4.6)
Potassium: 4 mmol/L (ref 3.5–5.1)
Sodium: 134 mmol/L — ABNORMAL LOW (ref 135–145)

## 2024-02-22 LAB — COOXEMETRY PANEL
Carboxyhemoglobin: 1.6 % — ABNORMAL HIGH (ref 0.5–1.5)
Methemoglobin: 0.7 % (ref 0.0–1.5)
O2 Saturation: 48.5 %
Total hemoglobin: 11.1 g/dL — ABNORMAL LOW (ref 12.0–16.0)

## 2024-02-22 LAB — ECHOCARDIOGRAM LIMITED
Height: 64 in
Weight: 2779.56 [oz_av]

## 2024-02-22 LAB — CBC
HCT: 33.3 % — ABNORMAL LOW (ref 36.0–46.0)
Hemoglobin: 10.6 g/dL — ABNORMAL LOW (ref 12.0–15.0)
MCH: 31.5 pg (ref 26.0–34.0)
MCHC: 31.8 g/dL (ref 30.0–36.0)
MCV: 99.1 fL (ref 80.0–100.0)
Platelets: 102 K/uL — ABNORMAL LOW (ref 150–400)
RBC: 3.36 MIL/uL — ABNORMAL LOW (ref 3.87–5.11)
RDW: 20.3 % — ABNORMAL HIGH (ref 11.5–15.5)
WBC: 7.6 K/uL (ref 4.0–10.5)
nRBC: 0 % (ref 0.0–0.2)

## 2024-02-22 LAB — HEPATIC FUNCTION PANEL
ALT: 19 U/L (ref 0–44)
AST: 25 U/L (ref 15–41)
Albumin: 3.3 g/dL — ABNORMAL LOW (ref 3.5–5.0)
Alkaline Phosphatase: 270 U/L — ABNORMAL HIGH (ref 38–126)
Bilirubin, Direct: 0.6 mg/dL — ABNORMAL HIGH (ref 0.0–0.2)
Indirect Bilirubin: 0.4 mg/dL (ref 0.3–0.9)
Total Bilirubin: 1.1 mg/dL (ref 0.0–1.2)
Total Protein: 5.8 g/dL — ABNORMAL LOW (ref 6.5–8.1)

## 2024-02-22 LAB — MAGNESIUM: Magnesium: 2.2 mg/dL (ref 1.7–2.4)

## 2024-02-22 LAB — HEPARIN LEVEL (UNFRACTIONATED)
Heparin Unfractionated: 0.25 [IU]/mL — ABNORMAL LOW (ref 0.30–0.70)
Heparin Unfractionated: 0.44 [IU]/mL (ref 0.30–0.70)

## 2024-02-22 MED ORDER — ENSURE PLUS HIGH PROTEIN PO LIQD
237.0000 mL | Freq: Three times a day (TID) | ORAL | Status: DC
  Administered 2024-02-22 – 2024-03-06 (×10): 237 mL via ORAL

## 2024-02-22 MED ORDER — PROSOURCE TF20 ENFIT COMPATIBL EN LIQD
60.0000 mL | Freq: Three times a day (TID) | ENTERAL | Status: DC
  Administered 2024-02-22 – 2024-02-24 (×8): 60 mL
  Filled 2024-02-22 (×8): qty 60

## 2024-02-22 MED ORDER — PERFLUTREN LIPID MICROSPHERE
1.0000 mL | INTRAVENOUS | Status: AC | PRN
  Administered 2024-02-22: 3 mL via INTRAVENOUS

## 2024-02-22 MED ORDER — VITAL 1.5 CAL PO LIQD
720.0000 mL | ORAL | Status: DC
  Administered 2024-02-22 – 2024-02-24 (×3): 720 mL
  Filled 2024-02-22 (×5): qty 948

## 2024-02-22 MED ORDER — MIRTAZAPINE 7.5 MG PO TABS
30.0000 mg | ORAL_TABLET | Freq: Every day | ORAL | Status: DC
  Administered 2024-02-22 – 2024-03-04 (×12): 30 mg
  Filled 2024-02-22 (×2): qty 4
  Filled 2024-02-22: qty 2
  Filled 2024-02-22: qty 4
  Filled 2024-02-22: qty 2
  Filled 2024-02-22: qty 4
  Filled 2024-02-22: qty 2
  Filled 2024-02-22: qty 4
  Filled 2024-02-22: qty 2
  Filled 2024-02-22: qty 4

## 2024-02-22 NOTE — Progress Notes (Signed)
 PHARMACY - ANTICOAGULATION CONSULT NOTE  Pharmacy Consult for heparin  Indication: atrial fibrillation  Allergies[1]  Patient Measurements: Height: 5' 4 (162.6 cm) Weight: 78.8 kg (173 lb 11.6 oz) IBW/kg (Calculated) : 54.7 HEPARIN  DW (KG): 66.5  Vital Signs: Temp: 98.4 F (36.9 C) (12/19 1500) Temp Source: Axillary (12/19 1500) BP: 123/55 (12/19 1700) Pulse Rate: 57 (12/19 1700)  Labs: Recent Labs    02/21/24 0435 02/21/24 0634 02/21/24 1627 02/22/24 0452 02/22/24 1608  HGB 10.5*  --  10.3* 10.6*  --   HCT 33.1*  --  32.8* 33.3*  --   PLT 88*  --  96* 102*  --   HEPARINUNFRC  --  0.33  --  0.25* 0.44  CREATININE 2.02*  --  2.35* 1.79*  --     Estimated Creatinine Clearance: 30.5 mL/min (A) (by C-G formula based on SCr of 1.79 mg/dL (H)).   Medical History: Past Medical History:  Diagnosis Date   Medical history non-contributory    Thyroid  disease    Vertigo     Medications:  Scheduled:   amiodarone   400 mg Per Tube BID   clonazePAM   0.5 mg Per Tube QHS   feeding supplement  237 mL Oral TID BM   feeding supplement (PROSource TF20)  60 mL Per Tube TID   fiber supplement (BANATROL TF)  60 mL Per Tube BID   Gerhardt's butt cream   Topical BID   insulin  aspart  0-9 Units Subcutaneous Q4H   insulin  glargine  15 Units Subcutaneous Daily   mirtazapine   30 mg Per Tube QHS   mouth rinse  15 mL Mouth Rinse 4 times per day   pantoprazole   40 mg Oral Daily   QUEtiapine   25 mg Per Tube BID   scopolamine   1 patch Transdermal Q72H   sodium chloride  flush  10-40 mL Intracatheter Q12H    Assessment: Tracy Baker is a 72 YOF now s/p MV repair and redo on 12/5. Hospitalization complicated by acute RV failure with cardiogenic shock s/p Protek duo VV ECMO cannula. Patient also with atrial fibrillation throughout this hospitalization.  Discussed with CCM/HF team and decision made to start low-dose heparin  infusion 12/14. Pharmacy consulted for heparin  dosing.   02/21/24:  Heparin  level 0.25 now subtherapeutic on 1000 units/hr. Hgb 10.6 (stable), pltc 102 (continue to trend up).  CRRT stopped 12/16, minimal UOP after Lasix  challenge the past two days.  Will need Eliquis  eventually pending [facility] plans.   Heparin  level is therapeutic at 0.44, on 1100 units/hr. No s/sx of bleeding or infusion issues. Drawn appropriately.  Goal of Therapy:  Heparin  level 0.3-0.5 Monitor platelets by anticoagulation protocol: Yes   Plan:  Continue heparin  infusion at 1100 units/hr  8h heparin  level with AM labs  Monitor heparin  level, CBC, and s/sx of bleeding daily  Thank you for allowing pharmacy to participate in this patient's care,  Suzen Sour, PharmD, BCCCP Clinical Pharmacist  Phone: [phone] 02/22/2024 5:34 PM  Please check AMION for all [facility] Pharmacy phone numbers After 10:00 PM, call Main Pharmacy [phone]      [1]  Allergies Allergen Reactions   Codeine Anaphylaxis and Nausea And Vomiting   Bactoshield Chg [Chlorhexidine  Gluconate] Dermatitis   Chlorhexidine  Dermatitis and Rash   Red Dye #40 (Allura Red) Nausea Only

## 2024-02-22 NOTE — Progress Notes (Signed)
 02/22/2024 PM rounds  Stable day, continue mobility efforts. Nightly feeds  Rolan Sharps MD PCCM

## 2024-02-22 NOTE — Progress Notes (Signed)
 TCTS PM Rounding Progress Note  Sat in chair for 2 hrs Still not much appetite Tube feeds getting cycled Dobutamine  at 2  Vitals:   02/22/24 1600 02/22/24 1700  BP: 110/64 (!) 123/55  Pulse: (!) 57 (!) 57  Resp: 17 18  Temp:    SpO2: 94% 99%    Plan: - iHD Tomorrow  Con Clunes, MD Cardiothoracic Surgery Pager: [phone]

## 2024-02-22 NOTE — Progress Notes (Addendum)
 " [facility] Progress Note    Assessment/ Plan:   AKI -b/l Cr 0.4-0.8. 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 CRRT started 12/13 to help offload volume and given encephalopathy/uremia. Was unable to wean from ventilator. Now extubated 12/15. Stopped CRRT 12/16. Failed lasix  challenge 12/17. Failed again on 12/18 with higher dose of lasix  and metolazone  -s/p HD 12/18, plan for HD tomorrow. No evidence of renal recovery at this junction but will continue to monitor for this. Can hold off on  -Avoid nephrotoxic medications including NSAIDs and iodinated intravenous contrast exposure unless the latter is absolutely indicated.  Preferred narcotic agents for pain control are hydromorphone , fentanyl , and methadone . Morphine should not be used. Avoid Baclofen and avoid oral sodium phosphate  and magnesium  citrate based laxatives / bowel preps. Continue strict Input and Output monitoring. 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 On fluconazole  -on dobuatmine  AHRF -extubated on 12/15 -currently stable -CXR today personally reviewed: improved aeration of right base with likely small bilateral pleural effusions  Anemia -transfuse PRN for Hgb <7 -avoid IV Fe -Hgb 10.6 & stable  Discussed with primary service, CCM, AHF, and ICU RN  Addendum: patient seen in afternoon. Rechecked CVP w/ RN, around 6-7. Doing okay clinically. Still on for HD tomorrow, no acute needs currently  Subjective:   Patient seen and examined in room. No acute events. Ended up running her on HD given rising CVP and failed lasix  challenge yesterday. CVP down to 5-6 this AM after net UF 3L. No complaints otherwise Remains on dobutamine , no other pressors. UOP 0.7L   Objective:   BP 108/73   Pulse 84   Temp  97.7 F (36.5 C) (Oral)   Resp 12   Ht 5' 4 (1.626 m)   Wt 78.8 kg   SpO2 98%   BMI 29.82 kg/m   Intake/Output Summary (Last 24 hours) at 02/22/2024 0830 Last data filed at 02/22/2024 0600 Gross per 24 hour  Intake 1949.42 ml  Output 3673 ml  Net -1723.58 ml   Weight change: 0.5 kg  Physical Exam: Gen: ill/tired appearing, NAD CVS: RRR Resp: cta bl, unlabored, normal wob Abd: soft Ext: wrapped LE's with some dependent edema Neuro: awake, alert but drowsy Dialysis access: LIJ temp HD cath  Imaging: No results found.   Labs: BMET Recent Labs  Lab 02/18/24 1658 02/19/24 0500 02/19/24 1654 02/20/24 0400 02/21/24 0435 02/21/24 1627 02/22/24 0452  NA 137 139 138 138 136 134* 134*  K 4.6 4.4 4.8 4.9 4.8 4.9 4.0  CL 100 102 102 103 98 97* 95*  CO2 28 27 26 25 25 23 27   GLUCOSE 103* 149* 100* 108* 201* 137* 124*  BUN 30* 24* 40* 51* 65* 75* 47*  CREATININE 0.81 0.83 1.25* 1.58* 2.02* 2.35* 1.79*  CALCIUM  8.4* 8.7* 8.9 8.9 8.7* 9.1 9.1  PHOS 1.9* 3.8 3.4 4.0 4.8* 5.1* 4.0   CBC Recent Labs  Lab 02/20/24 0400 02/21/24 0435 02/21/24 1627 02/22/24 0452  WBC 10.4 8.4 8.6 7.6  HGB 10.9* 10.5* 10.3* 10.6*  HCT 34.4* 33.1* 32.8* 33.3*  MCV 99.4 99.7 99.1 99.1  PLT 67* 88* 96* 102*    Medications:     amiodarone   400 mg Per Tube BID   clonazePAM   0.5 mg Per Tube  QHS   feeding supplement (PROSource TF20)  60 mL Per Tube Daily   fiber supplement (BANATROL TF)  60 mL Per Tube BID   Gerhardt's butt cream   Topical BID   insulin  aspart  0-9 Units Subcutaneous Q4H   insulin  glargine  15 Units Subcutaneous Daily   mirtazapine   30 mg Per Tube QHS   mouth rinse  15 mL Mouth Rinse 4 times per day   pantoprazole   40 mg Oral Daily   QUEtiapine   25 mg Per Tube BID   scopolamine   1 patch Transdermal Q72H   sodium chloride  flush  10-40 mL Intracatheter Q12H      [name], MD  [facility] 02/22/2024, 8:30 AM   "

## 2024-02-22 NOTE — Progress Notes (Signed)
 Brief Nutrition Follow-up Note:  Last iHD 12/18, noted plan for iHD again today. CRRT stopped 12/16  Pt speaks very softly, hard to hear. Pt is very weak and decompensated; per RN, pt is a 4 person assist. Pt ate minimally at breakfast this AM, pt stated some fruit, RN noted some of the fruit fell in the bed. RD also notes that pt only ordered chicken noodle soup and pudding for lunch today.  Pt receiving magic cup but not eating  Pt still indicating dry mouth as a barrier to oral intake. Pt reports continued poor appetite, which has been an issue since admission on 10/29. Pt also not eating well before admission.   Pt reports nausea at times but only with some meds by mouth. Despite poor appetite, pt does not indicate that she feels too full (with TF infusion) or experiencing any abdominal discomfort.   RD asks pt what foods sound good to her, pt states she can't think of any.  TF turned down to 30 ml/hr yesterday per MD, currently infusing at 30 ml/hr  Noted remeron  ordered to start today. Agree with initiation of remeron  for mood, appetite. Pt remains very flat  Recommend continuing Cortrak with continued supplemental nutrition to optimize patient from a nutritional perspective in hopes at promoting recovery and improving strength and ability to participate in therapies. Malnutrition and poor nutrition is a big barrier to overall improvement at this time  Plan to change to nocturnal TF while on Calorie Count. If po intake does not improve, pt not taking nutrition supplements, recommend increasing back to full goal TF  Interventions: 1) CALORIE COUNT: Continue Regular Diet 2) Change to 12 hour Nocturnal TF Tube Feeding via Cortrak: Vital 1.5 at 60 ml/hr x 12 hours (720 mL in 24 hours) Increase Pro-Source TF20 60 mL to TID TF provides 1320 kcals, 109 g of protein and 547 mL of free water  3) Re-Start Ensure Plus High Protein po TID, each supplement provides 350 kcal and 20 grams of  protein.  Ok to add Strawberry Syrup or other flavoring to Ensure if pt prefers   [name] MS, RDN, LDN, CNSC Registered Dietitian 3 Clinical Nutrition RD Inpatient Contact Info in [place]

## 2024-02-22 NOTE — Progress Notes (Signed)
 Physical Therapy Treatment Patient Details Name: Tracy Baker MRN: [id number] DOB: Dec 17, 1955 Today's Date: 02/22/2024   History of Present Illness 68 y.o. female adm 12/29/2023 after leaving AMA from [facility] (where she was on vacation due to lack of formal transfer) where she was being treated for C. Albicans candidemia. 10/28 TEE with mitral valve vegetation. 11/3 teeth Extractions. 11/6 Mitral valve repair. 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 Extubated 12/15. PMHx: thyroid  disease.    PT Comments  Prior to mobilization pt able to recall 3/3 precautions with prompting. RN assist with line management, pt able to get up to EoB with maxAx2. Once there, she is able to static sit with close guard and cuing for forward lean for bouts of 10-20 sec. Pt requires modAx2 for standing from EoB and maxAx2 for stepping to recliner. After seated rest break, pt able to stand and perform weight shifting with modAx2 for ~30 sec. D/c plans remain appropriate. PT will continue to follow acutely.    If plan is discharge home, recommend the following: Assistance with cooking/housework;Assist for transportation;Help with stairs or ramp for entrance;Two people to help with walking and/or transfers;Two people to help with bathing/dressing/bathroom;Assistance with feeding;Direct supervision/assist for medications management;Direct supervision/assist for financial management   Can travel by private vehicle      No  Equipment Recommendations  Other (comment) (TBD)    Recommendations for Other Services Rehab consult     Precautions / Restrictions Precautions Precautions: Fall;Sternal Precaution Booklet Issued: No Recall of Precautions/Restrictions: Impaired Precaution/Restrictions Comments: temp pacer, cortrak, foley Restrictions Weight Bearing Restrictions Per Provider Order: No RUE Weight Bearing Per Provider Order: Non weight  bearing RUE Partial Weight Bearing Percentage or Pounds: 5 LUE Weight Bearing Per Provider Order: Non weight bearing LUE Partial Weight Bearing Percentage or Pounds: 5 Other Position/Activity Restrictions: Cardiac sternal precautions     Mobility  Bed Mobility Overal bed mobility: Needs Assistance Bed Mobility: Rolling, Sidelying to Sit Rolling: Max assist Sidelying to sit: Max assist, +2 for physical assistance, +2 for safety/equipment       General bed mobility comments: Tactil cues and assist to manage BLEs and trunk    Transfers Overall transfer level: Needs assistance Equipment used: 2 person hand held assist Transfers: Sit to/from Stand, Bed to chair/wheelchair/BSC Sit to Stand: Mod assist, +2 safety/equipment Stand pivot transfers: Max assist, +2 safety/equipment         General transfer comment: modAx2 for power up from bed and from recliner, maxAx2 for sequencing stepping transfer to recliner on her R    Ambulation/Gait               General Gait Details: decreased ability to lift feet and sequence stepping for ambulation         Balance Overall balance assessment: Needs assistance Sitting-balance support: No upper extremity supported, Feet unsupported Sitting balance-Leahy Scale: Poor Sitting balance - Comments: Min to CGA for balance, posterior lean   Standing balance support: Bilateral upper extremity supported, During functional activity Standing balance-Leahy Scale: Poor Standing balance comment: Reliant on external support                            Communication Communication Communication: Impaired Factors Affecting Communication:  (soft spoken)  Cognition Arousal: Alert Behavior During Therapy: Flat affect   PT - Cognitive impairments: Awareness, Attention, Memory, Initiation, Sequencing, Problem solving, Safety/Judgement  PT - Cognition Comments: continues to be delayed in her response to  questions, able to recall sternal precautions, continues to have poor initiation and sequencing Following commands: Impaired Following commands impaired: Only follows one step commands consistently, Follows one step commands with increased time    Cueing Cueing Techniques: Verbal cues, Tactile cues, Gestural cues  Exercises Other Exercises Other Exercises: Stood from recliner with modAx2, with maxAx2 able to perform weightshifts L and R x 5 before knee buckling and returning to seated in recliner    General Comments General comments (skin integrity, edema, etc.): VSS on RA      Pertinent Vitals/Pain Pain Assessment Pain Assessment: Faces Faces Pain Scale: Hurts little more Pain Location: sternum Pain Descriptors / Indicators: Sore Pain Intervention(s): Limited activity within patient's tolerance, Monitored during session, Repositioned    Home Living Family/patient expects to be discharged to:: Private residence                            PT Goals (current goals can now be found in the care plan section) Acute Rehab PT Goals Patient Stated Goal: agreeable to session, did not state a goal PT Goal Formulation: With patient Time For Goal Achievement: 02/27/24 Potential to Achieve Goals: Fair Progress towards PT goals: Progressing toward goals     AM-PAC PT 6 Clicks Mobility   Outcome Measure  Help needed turning from your back to your side while in a flat bed without using bedrails?: A Lot Help needed moving from lying on your back to sitting on the side of a flat bed without using bedrails?: Total Help needed moving to and from a bed to a chair (including a wheelchair)?: Total Help needed standing up from a chair using your arms (e.g., wheelchair or bedside chair)?: Total Help needed to walk in hospital room?: Total Help needed climbing 3-5 steps with a railing? : Total 6 Click Score: 7    End of Session Equipment Utilized During Treatment: Gait belt Activity  Tolerance: Patient limited by fatigue;Patient tolerated treatment well Patient left: with call bell/phone within reach;with nursing/sitter in room;in chair;Other (comment) (hoyer lift pad under pt) Nurse Communication: Mobility status PT Visit Diagnosis: Other abnormalities of gait and mobility (R26.89);Muscle weakness (generalized) (M62.81);Difficulty in walking, not elsewhere classified (R26.2);Unsteadiness on feet (R26.81)     Time: 8554-8496 PT Time Calculation (min) (ACUTE ONLY): 18 min  Charges:    $Therapeutic Activity: 8-22 mins PT General Charges $$ ACUTE PT VISIT: 1 Visit                     Dody Smartt B. Fleeta Lapidus PT, DPT Acute Rehabilitation Services Please use secure chat or  Call Office [phone]    Tracy Baker Fleeta [facility] 02/22/2024, 4:15 PM

## 2024-02-22 NOTE — Progress Notes (Addendum)
 Patient ID: Tracy Baker, female   DOB: [birth year], 68 y.o.   MRN: [id number]     Advanced Heart Failure Rounding Note  Cardiologist: Lonni Cash, MD  HF Cardiologist: Dr. Zenaida  Chief Complaint: S/p MV Repair / MV  endocarditis   Patient Profile  Tracy Baker is a 68 y.o. female with hx of recent nephrolithiasis s/p ureteral stent and lithotripsy at [facility] 10/10 with stent removal. 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 Post-op course c/b post cardiotomy shock, junctional bradycardia, afib w/ RVR and HAP.   Significant events:   12/2 RHC (on 0.125 of Milrinone ): RA 4, PA 42/13 (22), PCW 12 with Vwaves to 20, CO/CI TD 3.37/2.03, PAPi 7.5 12/5 S/P Redo MVR - Intra Op given  4UPRBCs, 2UPLTs, 2FFP, 1Cyro 12/6 Protek Duo cannulation 12/8 Decannulated.  Post-op echo showed EF 50%, RV mildly dilated and mildly dysfunctional, s/p bioprosthetic mitral valve (stable). 12/13 CVVH begun 12/15: extubated 12/16: CRRT stopped.  12/17: Started DBA wean 12/18: Failed lasix  challenge. iHD   Subjective:    Co-ox 49% this am on 3 DBA.   Marginal UOP yesterday with IV lasix  and metolazone . Had iHD last night. Tolerated 3L volume removal. CVP 6 this am.  Ate very little yesterday. Extremely weak. Required max assist to chair yesterday.    Objective:    Weight Range: 78.8 kg Body mass index is 29.82 kg/m.   Vital Signs:   Temp:  [97.3 F (36.3 C)-98.9 F (37.2 C)] 97.7 F (36.5 C) (12/19 0700) Pulse Rate:  [56-96] 84 (12/19 0700) Resp:  [12-29] 12 (12/19 0700) BP: (90-156)/(46-79) 108/73 (12/19 0700) SpO2:  [84 %-100 %] 98 % (12/19 0700) Weight:  [76 kg-78.8 kg] 78.8 kg (12/19 0500) Last BM Date : 02/21/24  Weight change: Filed Weights   02/21/24 0500 02/21/24 1650 02/22/24 0500  Weight: 75.5 kg 76 kg 78.8 kg   Intake/Output:  Intake/Output Summary (Last 24 hours) at 02/22/2024 0737 Last data filed at 02/22/2024 0600 Gross per 24  hour  Intake 2089.01 ml  Output 3718 ml  Net -1628.99 ml   Physical Exam   General:  Sitting up in bed. Appears weak, chronically ill.  Cor: JVP not elevated. Irregular rhythm. No murmurs. Lungs: breathing nonlabored Abdomen: soft, nontender, nondistended. Extremities: 1+ edema Neuro: alert & orientedx3. Affect flat.   Telemetry   Afib with controlled rate  Labs    CBC Recent Labs    02/21/24 1627 02/22/24 0452  WBC 8.6 7.6  HGB 10.3* 10.6*  HCT 32.8* 33.3*  MCV 99.1 99.1  PLT 96* 102*   Basic Metabolic Panel Recent Labs    87/81/74 0435 02/21/24 1627 02/22/24 0452  NA 136 134* 134*  K 4.8 4.9 4.0  CL 98 97* 95*  CO2 25 23 27   GLUCOSE 201* 137* 124*  BUN 65* 75* 47*  CREATININE 2.02* 2.35* 1.79*  CALCIUM  8.7* 9.1 9.1  MG 2.4  --  2.2  PHOS 4.8* 5.1* 4.0   Liver Function Tests Recent Labs    02/20/24 0400 02/21/24 0435 02/21/24 1627 02/22/24 0452  AST 33  --   --  25  ALT 20  --   --  19  ALKPHOS 273*  --   --  270*  BILITOT 1.1  --   --  1.1  PROT 5.7*  --   --  5.8*  ALBUMIN  3.3*   < > 3.4* 3.3*  3.3*   < > = values in  this interval not displayed.   Medications:    Scheduled Medications:  amiodarone   400 mg Per Tube BID   clonazePAM   0.5 mg Per Tube QHS   feeding supplement (PROSource TF20)  60 mL Per Tube Daily   fiber supplement (BANATROL TF)  60 mL Per Tube BID   Gerhardt's butt cream   Topical BID   insulin  aspart  0-9 Units Subcutaneous Q4H   insulin  glargine  15 Units Subcutaneous Daily   mirtazapine   15 mg Per Tube QHS   mouth rinse  15 mL Mouth Rinse 4 times per day   pantoprazole   40 mg Oral Daily   QUEtiapine   25 mg Per Tube BID   scopolamine   1 patch Transdermal Q72H   sodium chloride  flush  10-40 mL Intracatheter Q12H    Infusions:  anticoagulant sodium citrate      DOBUTamine  3 mcg/kg/min (02/22/24 0600)   feeding supplement (VITAL 1.5 CAL) 30 mL/hr at 02/22/24 0600   fluconazole  (DIFLUCAN ) IV Stopped (02/21/24 1151)    heparin  1,000 Units/hr (02/22/24 0600)   promethazine  (PHENERGAN ) injection (IM or IVPB) Stopped (02/19/24 2145)    PRN Medications: albuterol , alteplase , anticoagulant sodium citrate , heparin , lidocaine  (PF), lidocaine -prilocaine , mouth rinse, pentafluoroprop-tetrafluoroeth, polyethylene glycol, promethazine  (PHENERGAN ) injection (IM or IVPB), simethicone , sodium chloride  flush  Assessment/Plan   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 Kerrin for Candida endocarditis. Pre-op L/RHC 01/04/24 with no CAD and normal L/R heart pressures.  - Failed initial Milrinone  wean. Eventually weaned off, unfortunately restarted 12/1 for low output.  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 - Co-ox low 48% today on 3 DBA. Will check limited echo to ensure LV/RV and mitral valve function are stable. If echo okay, plan to wean off DBA and stop following co-ox.  - CVVH stopped 12/16. Failed lasix  challenge with rising BUN and Scr. Tolerated iHD on 12/18. Next session 12/20.  Acute hypoxic respiratory failure: Suspect component of infection/aspiration PNA given unilateral disease.  - Respiratory culture- Enterobacter, On fluconazole . Completed cefepime  and vanc. - Extubated 12/15. Stable on RA.  Coagulopathy/thrombocytopenia/ABLA anemia:  - Slowly improving, up to 102K today.  Suspect this is inflammatory due to critical illness.  - Continue heparin  gtt,   Sick sinus syndrome - Initially with asystole underneath (as of 12/10) - Now with atrial fibrillation and intermittent junctional rhythm  Afib - Post  operative - Continue heparin  gtt, eventually switch to po anticoagulation when no more procedures - Has received adequate amiodarone  load. Continue po amiodarone . No cardioversion for now with concerns for bradycardia.   AKI on CKD 3b - Stopped CVVH 12/16. Failed lasix  challenge. iHD 12/18. Tolerated well. - Next HD tomorrow - Mgmt per nephrology  Protein-calorie malnutrition - On TF - SLP following - Not taking in much po. Increase remeron  to stimulate appetite  Epistaxis  - s/p bilateral nasal packing per ENT 12/1     Pressure Ulcer R Buttock, DTI - Q2 turns, offload  Left radial artery occlusion - Left ulnar artery is patent.  - Continue heparin  gtt  Extremely weak. Needs ongoing aggressive PT/OT. Needs more progress with therapy for CIR admission.  FINCH, LINDSAY  N 02/22/2024 7:37 AM  Patient seen with PA, I formulated the plan and agree with the above note.   She had HD last night, CVP 6 today.  She remains in rate-controlled atrial fibrillation.   Co-ox 49% on dobutamine  3.    Weak but has been working with PT.   General: NAD Neck: No JVD, no thyromegaly or thyroid  nodule.  Lungs: Clear to auscultation bilaterally with normal respiratory effort. CV: Nondisplaced PMI.  Heart irregular S1/S2, no S3/S4, no murmur.  1+ edema to knees.  Abdomen: Soft, nontender, no hepatosplenomegaly, no distention.  Skin: Intact without lesions or rashes.  Neurologic: Alert and oriented x 3.  Psych: Normal affect. Extremities: No clubbing or cyanosis.  HEENT: Normal.   Volume status looks good today.  Plan for next HD tomorrow.   She remains in rate-controlled AF on amiodarone  po and heparin  gtt.  To Eliquis  when procedures completed. Platelets trending up. Have been hesitant to cardiovert yet with recent bradycardia, think this can happen in future but is not urgent.   She is still on dobutamine  3 with co-ox 49%, last echo with normal EF and only mild RV dysfunction.  Will  repeat limited echo today, if LV and RV function appear preserved, will wean down dobutamine  and stop following co-ox.   Ezra Shuck 02/22/2024 10:22 AM

## 2024-02-22 NOTE — Progress Notes (Signed)
 PHARMACY - ANTICOAGULATION CONSULT NOTE  Pharmacy Consult for heparin  Indication: atrial fibrillation  Allergies[1]  Patient Measurements: Height: 5' 4 (162.6 cm) Weight: 78.8 kg (173 lb 11.6 oz) IBW/kg (Calculated) : 54.7 HEPARIN  DW (KG): 66.5  Vital Signs: Temp: 98.5 F (36.9 C) (12/18 2330) Temp Source: Axillary (12/18 2330) BP: 108/73 (12/19 0700) Pulse Rate: 84 (12/19 0700)  Labs: Recent Labs    02/20/24 2114 02/21/24 0435 02/21/24 0634 02/21/24 1627 02/22/24 0452  HGB  --  10.5*  --  10.3* 10.6*  HCT  --  33.1*  --  32.8* 33.3*  PLT  --  88*  --  96* 102*  HEPARINUNFRC 0.27*  --  0.33  --  0.25*  CREATININE  --  2.02*  --  2.35* 1.79*    Estimated Creatinine Clearance: 30.5 mL/min (A) (by C-G formula based on SCr of 1.79 mg/dL (H)).   Medical History: Past Medical History:  Diagnosis Date   Medical history non-contributory    Thyroid  disease    Vertigo     Medications:  Scheduled:   amiodarone   400 mg Per Tube BID   clonazePAM   0.5 mg Per Tube QHS   feeding supplement (PROSource TF20)  60 mL Per Tube Daily   fiber supplement (BANATROL TF)  60 mL Per Tube BID   Gerhardt's butt cream   Topical BID   insulin  aspart  0-9 Units Subcutaneous Q4H   insulin  glargine  15 Units Subcutaneous Daily   mirtazapine   15 mg Per Tube QHS   mouth rinse  15 mL Mouth Rinse 4 times per day   pantoprazole   40 mg Oral Daily   QUEtiapine   25 mg Per Tube BID   scopolamine   1 patch Transdermal Q72H   sodium chloride  flush  10-40 mL Intracatheter Q12H    Assessment: Ms. Pacholski is a 69 YOF now s/p MV repair and redo on 12/5. Hospitalization complicated by acute RV failure with cardiogenic shock s/p Protek duo VV ECMO cannula. Patient also with atrial fibrillation throughout this hospitalization.  Discussed with CCM/HF team and decision made to start low-dose heparin  infusion 12/14. Pharmacy consulted for heparin  dosing.   02/21/24: Heparin  level 0.25 now subtherapeutic on  1000 units/hr. Hgb 10.6 (stable), pltc 102 (continue to trend up).  CRRT stopped 12/16, minimal UOP after Lasix  challenge the past two days.  Will need Eliquis  eventually pending [facility] plans.   Goal of Therapy:  Heparin  level 0.3-0.5 Monitor platelets by anticoagulation protocol: Yes   Plan:  Increase heparin  IV 1100 units/hr  8h heparin  level Monitor heparin  level, CBC, and s/sx of bleeding daily  Maurilio Fila, PharmD Clinical Pharmacist 02/22/2024  7:27 AM    [1]  Allergies Allergen Reactions   Codeine Anaphylaxis and Nausea And Vomiting   Bactoshield Chg [Chlorhexidine  Gluconate] Dermatitis   Chlorhexidine  Dermatitis and Rash   Red Dye #40 (Allura Red) Nausea Only

## 2024-02-22 NOTE — Progress Notes (Signed)
 "  NAME:  Tracy Baker, MRN:  [id number], DOB:  October 09, 1955, LOS: 51 ADMISSION DATE:  12/28/2023, CONSULTATION DATE:  02/08/24 REFERRING MD:  Kerrin RAMAN., CHIEF COMPLAINT:  s/p MVR   History of Present Illness:  Tracy Baker is a 68 year old female with past medical history significant for nephrolithiasis s/p ureteral stent and lithotripsy at [facility] 10/10, with stent removal, severe MR, MV repair (01/10/24) due to MV Candida endocarditis c/b continued severe MR, who presents today for a redo sternotomy and MVR w/ Dr. Kerrin. PCCM consulted for ICU post-op vent management initially.  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 Kerrin 12/6: Overnight patient started requiring more vasopressor support, early morning she was on dobutamine  5, milrinone  0.375, epinephrine  at 10, vasopressin  0.04 and norepinephrine  34 mics, she is not making urine.  Cannulated with Protek duo for RV support,  12/7 bronch, broadened antibiotics 12/8 decannulated from VV ECMO after sweep and flow wean trials. Lasix  stopped. Restarted TF. 12/10 chest tubes out, off pressors. Aline removed. 12/13 started CRRT 12/15 extubated, weak, continued fevers remains on CRRT 12/16 CRRT off this AM, less delirious, LE dopplers neg 12/17 off CRRT, trial diuresis   Interim History / Subjective:  HD yesterday with 3 L UF, negative 1.6 L overnight and net + 4.6 L this admission   anticoagulant sodium citrate      DOBUTamine  3 mcg/kg/min (02/22/24 0600)   feeding supplement (VITAL 1.5 CAL) 30 mL/hr at 02/22/24 0600   fluconazole  (DIFLUCAN ) IV Stopped (02/21/24 1151)   heparin  1,100 Units/hr (02/22/24 0753)   promethazine  (PHENERGAN ) injection (IM or IVPB) Stopped (02/19/24 2145)     Objective     Blood pressure 108/73, pulse 84, temperature 97.7 F (36.5 C), temperature source Oral, resp. rate 12, height 5' 4 (1.626 m), weight 78.8 kg, SpO2 98%. CVP:  [11 mmHg] 11 mmHg      Intake/Output Summary (Last 24 hours) at 02/22/2024 0810 Last data filed at 02/22/2024 0600 Gross per 24 hour  Intake 1949.42 ml  Output 3673 ml  Net -1723.58 ml   Filed Weights   02/21/24 0500 02/21/24 1650 02/22/24 0500  Weight: 75.5 kg 76 kg 78.8 kg   Alert and oriented Midline chest wall incision without erythema/drainage Lungs clear Abdomen soft Lower extremities wrapped  Patient Lines/Drains/Airways Status     Active Line/Drains/Airways     Name Placement date Placement time Site Days   PICC Double Lumen 01/15/24 Right Basilic 35 cm 0 cm 01/15/24  8384  -- 38   Hemodialysis Catheter Left Internal jugular Triple lumen Temporary (Non-Tunneled) 02/16/24  1600  Internal jugular  6   Urethral Catheter Lauraine Holts, RN Temperature probe 14 Fr. 02/18/24  1422  Temperature probe  4   Small Bore Feeding Tube Left nare Marking at nare/corner of mouth 67 cm 02/15/24  1135  Left nare  7   Wound 01/16/24 0800 Traumatic Back Left;Upper 01/16/24  0800  Back  37   Wound 02/08/24 Surgical Closed Surgical Incision Sternum Mid 02/08/24  --  Sternum  14   Wound 02/12/24 1800 Pressure Injury Buttocks Right;Medial Deep Tissue Pressure Injury - Purple or maroon localized area of discolored intact skin or blood-filled blister due to damage of underlying soft tissue from pressure and/or shear. 02/12/24  1800  Buttocks  10   Wound 02/13/24 1900 Irritant Contact Dermatitis Neck Right 02/13/24  1900  Neck  9   Wound 02/14/24 0800 Other (Comment) Groin Bilateral 02/14/24  0800  Groin  8             Resolved Problem List:   Hypokalemia Ileus RUL pneumonia- Enterobacter; s/p course of cefepime  Acute blood loss anemia status post multiple PRBC transfusion Thrombocytopenia due to critical illness status post  2 unit platelets Coagulopathy, probably from liver congestion requiring FFP transfusion  Assessment and Plan:    MV endocarditis s/p MV repair 01/10/2024, with persistent severe mitral regurgitation and status post redo sternotomy and bioprosthetic MVR Previous candidemia Acute RV failure with cardiogenic shock status post Protek duo VV ECMO cannula on 12/6, decannulated 12/8 - Prolonged course of fluconazole  per ID - Dobutamine  per HF team  AKI on CKD stage IIIa - Off CRRT 12/16.  Not a robust response to IV diuretics/metolazone  and therefore started on HD 12/18 with 3 L UF - HD tomorrow  Hypoglycemia improved; now mild hyperglycemia on TF H/o prediabetes, A1c 6-most recently 5.4 - Continue SSI  Severe protein calorie malnutrition-  - Poor Po intake, on remeron   - Supplemental nutrition with tube feeds  Hyperbilirubinemia; unclear etiology and improving  Deconditioning, prolonged course of illness leading up to this  - PT/OT  Left radial artery occlusion with patent left ulnar artery - Heparin  infusion  Pressure ulcer on buttocks skin breakdown from bandage on R neck - Mobilize  "

## 2024-02-22 NOTE — Progress Notes (Signed)
 Echocardiogram 2D Echocardiogram has been performed.  Tracy Baker 02/22/2024, 12:19 PM

## 2024-02-23 ENCOUNTER — Encounter (HOSPITAL_COMMUNITY): Payer: Self-pay | Admitting: Thoracic Surgery (Cardiothoracic Vascular Surgery)

## 2024-02-23 DIAGNOSIS — B377 Candidal sepsis: Secondary | ICD-10-CM | POA: Diagnosis not present

## 2024-02-23 DIAGNOSIS — I4819 Other persistent atrial fibrillation: Secondary | ICD-10-CM | POA: Diagnosis not present

## 2024-02-23 LAB — RENAL FUNCTION PANEL
Albumin: 3.4 g/dL — ABNORMAL LOW (ref 3.5–5.0)
Anion gap: 15 (ref 5–15)
BUN: 81 mg/dL — ABNORMAL HIGH (ref 8–23)
CO2: 24 mmol/L (ref 22–32)
Calcium: 9 mg/dL (ref 8.9–10.3)
Chloride: 95 mmol/L — ABNORMAL LOW (ref 98–111)
Creatinine, Ser: 2.64 mg/dL — ABNORMAL HIGH (ref 0.44–1.00)
GFR, Estimated: 19 mL/min — ABNORMAL LOW
Glucose, Bld: 174 mg/dL — ABNORMAL HIGH (ref 70–99)
Phosphorus: 5.1 mg/dL — ABNORMAL HIGH (ref 2.5–4.6)
Potassium: 4.5 mmol/L (ref 3.5–5.1)
Sodium: 133 mmol/L — ABNORMAL LOW (ref 135–145)

## 2024-02-23 LAB — CBC
HCT: 30.2 % — ABNORMAL LOW (ref 36.0–46.0)
Hemoglobin: 9.7 g/dL — ABNORMAL LOW (ref 12.0–15.0)
MCH: 31.6 pg (ref 26.0–34.0)
MCHC: 32.1 g/dL (ref 30.0–36.0)
MCV: 98.4 fL (ref 80.0–100.0)
Platelets: 111 K/uL — ABNORMAL LOW (ref 150–400)
RBC: 3.07 MIL/uL — ABNORMAL LOW (ref 3.87–5.11)
RDW: 20.3 % — ABNORMAL HIGH (ref 11.5–15.5)
WBC: 6.4 K/uL (ref 4.0–10.5)
nRBC: 0.3 % — ABNORMAL HIGH (ref 0.0–0.2)

## 2024-02-23 LAB — COOXEMETRY PANEL
Carboxyhemoglobin: 2 % — ABNORMAL HIGH (ref 0.5–1.5)
Methemoglobin: <0.7 % (ref 0.0–1.5)
O2 Saturation: 56.3 %
Total hemoglobin: 10.7 g/dL — ABNORMAL LOW (ref 12.0–16.0)

## 2024-02-23 LAB — HEPATIC FUNCTION PANEL
ALT: 16 U/L (ref 0–44)
AST: 21 U/L (ref 15–41)
Albumin: 3.4 g/dL — ABNORMAL LOW (ref 3.5–5.0)
Alkaline Phosphatase: 263 U/L — ABNORMAL HIGH (ref 38–126)
Bilirubin, Direct: 0.6 mg/dL — ABNORMAL HIGH (ref 0.0–0.2)
Indirect Bilirubin: 0.4 mg/dL (ref 0.3–0.9)
Total Bilirubin: 1 mg/dL (ref 0.0–1.2)
Total Protein: 5.8 g/dL — ABNORMAL LOW (ref 6.5–8.1)

## 2024-02-23 LAB — GLUCOSE, CAPILLARY
Glucose-Capillary: 163 mg/dL — ABNORMAL HIGH (ref 70–99)
Glucose-Capillary: 154 mg/dL — ABNORMAL HIGH (ref 70–99)
Glucose-Capillary: 118 mg/dL — ABNORMAL HIGH (ref 70–99)
Glucose-Capillary: 112 mg/dL — ABNORMAL HIGH (ref 70–99)
Glucose-Capillary: 170 mg/dL — ABNORMAL HIGH (ref 70–99)
Glucose-Capillary: 156 mg/dL — ABNORMAL HIGH (ref 70–99)

## 2024-02-23 LAB — HEPARIN LEVEL (UNFRACTIONATED): Heparin Unfractionated: 0.32 [IU]/mL (ref 0.30–0.70)

## 2024-02-23 LAB — MAGNESIUM: Magnesium: 2.4 mg/dL (ref 1.7–2.4)

## 2024-02-23 MED ORDER — QUETIAPINE FUMARATE 50 MG PO TABS
25.0000 mg | ORAL_TABLET | Freq: Every day | ORAL | Status: DC
  Administered 2024-02-23 – 2024-03-04 (×11): 25 mg
  Filled 2024-02-23 (×9): qty 1

## 2024-02-23 MED ORDER — SERTRALINE HCL 25 MG PO TABS
25.0000 mg | ORAL_TABLET | Freq: Every day | ORAL | Status: DC
  Administered 2024-02-23 – 2024-03-04 (×11): 25 mg
  Filled 2024-02-23 (×9): qty 1

## 2024-02-23 NOTE — Progress Notes (Signed)
 [facility] PM Rounding Progress Note  Doing well this evening, just finished dialysis  Vitals:   02/23/24 1843 02/23/24 1848  BP: 93/66 (!) 118/102  Pulse:  92  Resp: (!) 22 18  Temp: 98.2 F (36.8 C) 98.2 F (36.8 C)  SpO2: 96% 97%    Plan: - Continue dobutamine  at 2 - iHD per nephrology  Con Clunes, MD Cardiothoracic Surgery Pager: [phone]

## 2024-02-23 NOTE — Progress Notes (Signed)
 Patient ID: Tracy Baker, female   DOB: 1956-03-03, 68 y.o.   MRN: [id number]     Advanced Heart Failure Rounding Note  Cardiologist: Lonni Cash, MD  HF Cardiologist: Dr. Zenaida  Chief Complaint: S/p MV Repair / MV  endocarditis   Patient Profile  Tracy Baker is a 68 y.o. female with hx of recent nephrolithiasis s/p ureteral stent and lithotripsy at [facility] 10/10 with stent removal. 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 Post-op course c/b post cardiotomy shock, junctional bradycardia, afib w/ RVR and HAP.   Significant events:   12/2 RHC (on 0.125 of Milrinone ): RA 4, PA 42/13 (22), PCW 12 with Vwaves to 20, CO/CI TD 3.37/2.03, PAPi 7.5 12/5 S/P Redo MVR - Intra Op given  4UPRBCs, 2UPLTs, 2FFP, 1Cyro 12/6 Protek Duo cannulation 12/8 Decannulated.  Post-op echo showed EF 50%, RV mildly dilated and mildly dysfunctional, s/p bioprosthetic mitral valve (stable). 12/13 CVVH begun 12/15: extubated 12/16: CRRT stopped.  12/17: Started DBA wean 12/18: Failed lasix  challenge. iHD   Subjective:    Remains on DBA 2. Feels weak. Co-ox 56%  Essentially anuric. For HD today     Objective:    Weight Range: 79.5 kg Body mass index is 30.08 kg/m.   Vital Signs:   Temp:  [96.8 F (36 C)-99.5 F (37.5 C)] 97.3 F (36.3 C) (12/20 1800) Pulse Rate:  [54-93] 84 (12/20 1800) Resp:  [11-24] 14 (12/20 1800) BP: (93-134)/(48-95) 102/72 (12/20 1800) SpO2:  [94 %-100 %] 100 % (12/20 1800) Weight:  [78.4 kg-79.5 kg] 79.5 kg (12/20 1530) Last BM Date : 02/23/24  Weight change: Filed Weights   02/22/24 0500 02/23/24 0500 02/23/24 1530  Weight: 78.8 kg 78.4 kg 79.5 kg   Intake/Output:  Intake/Output Summary (Last 24 hours) at 02/23/2024 1802 Last data filed at 02/23/2024 1700 Gross per 24 hour  Intake 1511.78 ml  Output 70 ml  Net 1441.78 ml   Physical Exam   General:  Sitting up in bed. No resp difficulty HEENT: normal Neck:  supple.JVP to jaw Cor: Irregular rate & rhythm.  Lungs: clear Abdomen: soft, nontender, nondistended.Good bowel sounds. Extremities: no cyanosis, clubbing, rash, 2+ edema Neuro: alert & orientedx3, cranial nerves grossly intact. moves all 4 extremities w/o difficulty. Affect pleasant   Telemetry   Afib 70-80s Personally reviewed    Labs    CBC Recent Labs    02/22/24 0452 02/23/24 0455  WBC 7.6 6.4  HGB 10.6* 9.7*  HCT 33.3* 30.2*  MCV 99.1 98.4  PLT 102* 111*   Basic Metabolic Panel Recent Labs    87/80/74 0452 02/23/24 0455  NA 134* 133*  K 4.0 4.5  CL 95* 95*  CO2 27 24  GLUCOSE 124* 174*  BUN 47* 81*  CREATININE 1.79* 2.64*  CALCIUM  9.1 9.0  MG 2.2 2.4  PHOS 4.0 5.1*   Liver Function Tests Recent Labs    02/22/24 0452 02/23/24 0455  AST 25 21  ALT 19 16  ALKPHOS 270* 263*  BILITOT 1.1 1.0  PROT 5.8* 5.8*  ALBUMIN  3.3*  3.3* 3.4*  3.4*   Medications:    Scheduled Medications:  amiodarone   400 mg Per Tube BID   clonazePAM   0.5 mg Per Tube QHS   feeding supplement  237 mL Oral TID BM   feeding supplement (PROSource TF20)  60 mL Per Tube TID   fiber supplement (BANATROL TF)  60 mL Per Tube BID   Gerhardt's butt cream  Topical BID   insulin  aspart  0-9 Units Subcutaneous Q4H   insulin  glargine  15 Units Subcutaneous Daily   mirtazapine   30 mg Per Tube QHS   mouth rinse  15 mL Mouth Rinse 4 times per day   pantoprazole   40 mg Oral Daily   QUEtiapine   25 mg Per Tube QHS   scopolamine   1 patch Transdermal Q72H   sertraline   25 mg Per Tube QHS   sodium chloride  flush  10-40 mL Intracatheter Q12H    Infusions:  anticoagulant sodium citrate      DOBUTamine  2 mcg/kg/min (02/23/24 1700)   feeding supplement (VITAL 1.5 CAL) 720 mL (02/23/24 1753)   fluconazole  (DIFLUCAN ) IV Stopped (02/23/24 1122)   heparin  1,100 Units/hr (02/23/24 1700)   promethazine  (PHENERGAN ) injection (IM or IVPB) Stopped (02/19/24 2145)    PRN Medications: albuterol ,  alteplase , anticoagulant sodium citrate , heparin , lidocaine  (PF), lidocaine -prilocaine , mouth rinse, pentafluoroprop-tetrafluoroeth, polyethylene glycol, promethazine  (PHENERGAN ) injection (IM or IVPB), simethicone , sodium chloride  flush  Assessment/Plan   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 Kerrin for Candida endocarditis. Pre-op L/RHC 01/04/24 with no CAD and normal L/R heart pressures.  - Failed initial Milrinone  wean. Eventually weaned off, unfortunately restarted 12/1 for low output.  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 - Echo 12/19 EF 60% RV mildly HK  - Co-ox 56% on DBA 2. Would continue until fluid off with iHD - CVVH stopped 12/16. Failed lasix  challenge with rising BUN and Scr. - She is volume overloaded. Tolerated iHD on 12/18.Plan iHD today   Acute hypoxic respiratory failure: Suspect component of infection/aspiration PNA given unilateral disease.  - Respiratory culture- Enterobacter, On fluconazole . Completed cefepime  and vanc. - Extubated 12/15.  - stable on RA  Coagulopathy/thrombocytopenia/ABLA anemia:  - Slowly improving, up to 111K today.  Suspect this is inflammatory due to critical illness.  - Continue heparin  gtt,   Sick sinus syndrome - Initially with asystole underneath (as of 12/10) - Now with atrial fibrillation and intermittent junctional rhythm  Afib, persistent - Continue heparin  gtt, eventually switch to po anticoagulation when no more procedures - Has received adequate amiodarone  load. Continue po amiodarone . No cardioversion for now with concerns for  bradycardia.  - AF rate controlled  AKI on CKD 3b - Stopped CVVH 12/16. Failed lasix  challenge. iHD 12/18. Tolerated well. - iHD today  Protein-calorie malnutrition - On TF - SLP following - Not taking in much po. Increase remeron  to stimulate appetite  Pressure Ulcer R Buttock, DTI - Q2 turns, offload  Left radial artery occlusion - Left ulnar artery is patent.  - Continue heparin  gtt  Extremely weak. Needs ongoing aggressive PT/OT. Needs more progress with therapy for CIR admission.  Toribio Fuel MD 02/23/2024 6:02 PM

## 2024-02-23 NOTE — Plan of Care (Signed)

## 2024-02-23 NOTE — Consult Note (Signed)
 "     Chief Complaint: Patient was seen in consultation today for AKI w/o renal recovery Chief Complaint  Patient presents with   Candidemia   at the request of Dennise Hoes   Referring Physician(s): Dennise Hoes   Supervising Physician: Vanice Revel  Patient Status: [facility] - In-pt  History of Present Illness: Tracy Baker is a 68 y.o. 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   Patient came to [facility] on 02/08/24 for MV replacement, patient developed AKI with volume overload, nephrology was consulted and patient was started on CRRT via L internal jugular Trialysis catheter placed on 02/16/24. Renal function is not improving, IR was consulted for tunneled HD catheter placement by nephrology.   Patient sitting in bed, NDA but appears tired. RN at bedside.  Reports SOB. Patient is scheduled for iHD today and tomorrow.  Patient ask to not give family update today.    Past Medical History:  Diagnosis Date   Medical history non-contributory    Thyroid  disease    Vertigo     Past Surgical History:  Procedure Laterality Date   APPENDECTOMY     AUGMENTATION MAMMAPLASTY Bilateral 1981   removed in 2006   BREAST IMPLANT REMOVAL Bilateral 2007   CARDIOVERSION N/A 01/22/2024   Procedure: CARDIOVERSION;  Surgeon: Rolan Ezra RAMAN, MD;  Location: [facility] INVASIVE CV LAB;  Service: Cardiovascular;  Laterality: N/A;   CHOLECYSTECTOMY  2010   COLONOSCOPY WITH PROPOFOL  N/A 06/30/2021   Procedure: COLONOSCOPY WITH PROPOFOL ;  Surgeon: Jinny Carmine, MD;  Location: [facility] ENDOSCOPY;  Service: Endoscopy;  Laterality: N/A;   CONTROL OF EPISTAXIS  02/04/2024   ECMO CANNULATION N/A 02/09/2024   Procedure: ECMO CANNULATION;  Surgeon:  Zenaida Morene PARAS, MD;  Location: [facility] INVASIVE CV LAB;  Service: Cardiovascular;  Laterality: N/A;   INTRAOPERATIVE TRANSESOPHAGEAL ECHOCARDIOGRAM N/A 01/10/2024   Procedure: ECHOCARDIOGRAM, TRANSESOPHAGEAL, INTRAOPERATIVE;  Surgeon: Kerrin Elspeth BROCKS, MD;  Location: [facility] OR;  Service: Open Heart Surgery;  Laterality: N/A;   INTRAOPERATIVE TRANSESOPHAGEAL ECHOCARDIOGRAM N/A 02/08/2024   Procedure: ECHOCARDIOGRAM, TRANSESOPHAGEAL, INTRAOPERATIVE;  Surgeon: Kerrin Elspeth BROCKS, MD;  Location: [facility] OR;  Service: Open Heart Surgery;  Laterality: N/A;   LAPAROSCOPIC TOTAL HYSTERECTOMY  2006   MITRAL VALVE REPAIR N/A 01/10/2024   Procedure: REPAIR, MITRAL VALVE;  Surgeon: Kerrin Elspeth BROCKS, MD;  Location: [facility] OR;  Service: Open Heart Surgery;  Laterality: N/A;   MITRAL VALVE REPLACEMENT N/A 02/08/2024   Procedure: REPLACEMENT, MITRAL VALVE WITH MITRIS RESILIA MITRAL VALVE 27mm;  Surgeon: Kerrin Elspeth BROCKS, MD;  Location: [facility] OR;  Service: Open Heart Surgery;  Laterality: N/A;   REDO STERNOTOMY N/A 02/08/2024   Procedure: REDO STERNOTOMY;  Surgeon: Kerrin Elspeth BROCKS, MD;  Location: [facility] OR;  Service: Open Heart Surgery;  Laterality: N/A;   RIGHT HEART CATH N/A 02/05/2024   Procedure: RIGHT HEART CATH;  Surgeon: Zenaida Morene PARAS, MD;  Location: [facility] INVASIVE CV LAB;  Service: Cardiovascular;  Laterality: N/A;   RIGHT/LEFT HEART CATH AND CORONARY ANGIOGRAPHY N/A 01/04/2024   Procedure: RIGHT/LEFT HEART CATH AND CORONARY ANGIOGRAPHY;  Surgeon: Verlin Lonni BIRCH, MD;  Location: [facility] INVASIVE CV LAB;  Service: Cardiovascular;  Laterality: N/A;   TOOTH EXTRACTION N/A 01/07/2024   Procedure: DENTAL RESTORATION/EXTRACTIONS;  Surgeon: Sheryle Hamilton, DMD;  Location: [facility] OR;  Service: Oral Surgery;  Laterality: N/A;  EXTRACTION OF TEETH #2, #6, #7, #16, #29   TRANSESOPHAGEAL ECHOCARDIOGRAM (CATH LAB) N/A 01/01/2024   Procedure: TRANSESOPHAGEAL ECHOCARDIOGRAM;  Surgeon: Lonni Slain, MD;  Location:  [facility] INVASIVE CV LAB;  Service: Cardiovascular;  Laterality: N/A;   TRANSESOPHAGEAL ECHOCARDIOGRAM (CATH LAB) N/A 01/22/2024   Procedure: TRANSESOPHAGEAL ECHOCARDIOGRAM;  Surgeon: Rolan Ezra RAMAN, MD;  Location: [facility] INVASIVE CV LAB;  Service: Cardiovascular;  Laterality: N/A;    Allergies: Codeine, Bactoshield chg [chlorhexidine  gluconate], Chlorhexidine , and Red dye #40 (allura red)  Medications: Prior to Admission medications  Medication Sig Start Date End Date Taking? Authorizing Provider  acetaminophen  (TYLENOL ) 325 MG tablet Take 650 mg by mouth daily as needed for fever. 12/14/23  Yes [provider]  clonazePAM  (KLONOPIN ) 0.5 MG tablet Take 0.25-0.5 mg by mouth at bedtime as needed for anxiety. 06/07/21  Yes [provider]  ondansetron  (ZOFRAN ) 4 MG tablet Take 1 tablet (4 mg total) by mouth every 8 (eight) hours as needed for nausea or vomiting. 11/08/23  Yes McDonough, Lauren K, PA-C  pantoprazole  (PROTONIX ) 20 MG tablet Take 1 tablet (20 mg total) by mouth daily. 11/08/23  Yes McDonough, Lauren K, PA-C  lovastatin  (MEVACOR ) 20 MG tablet Take 1 tablet (20 mg total) by mouth at bedtime. Patient not taking: Reported on 12/29/2023 07/09/23   Kristina Tinnie POUR, PA-C     Family History  Problem Relation Age of Onset   Hypertension Mother    Diabetes Father    Breast cancer Neg Hx     Social History   Socioeconomic History   Marital status: Single    Spouse name: Not on file   Number of children: Not on file   Years of education: Not on file   Highest education level: Not on file  Occupational History   Not on file  Tobacco Use   Smoking status: Every Day    Current packs/day: 1.00    Types: Cigarettes   Smokeless tobacco: Never   Tobacco comments:    About 15-20 cigarettes a day  Substance and Sexual Activity   Alcohol use: Yes   Drug use: Never   Sexual activity: Not Currently  Other Topics Concern   Not on file  Social History Narrative   Not on file    Social Drivers of Health   Tobacco Use: High Risk (02/23/2024)   Patient History    Smoking Tobacco Use: Every Day    Smokeless Tobacco Use: Never    Passive Exposure: Not on file  Financial Resource Strain: Not on file  Food Insecurity: No Food Insecurity (12/29/2023)   Epic    Worried About Programme Researcher, Broadcasting/film/video in the Last Year: Never true    Ran Out of Food in the Last Year: Never true  Transportation Needs: No Transportation Needs (12/29/2023)   Epic    Lack of Transportation (Medical): No    Lack of Transportation (Non-Medical): No  Physical Activity: Not on file  Stress: Not on file  Social Connections: Socially Isolated (12/29/2023)   Social Connection and Isolation Panel    Frequency of Communication with Friends and Family: More than three times a week    Frequency of Social Gatherings with Friends and Family: Three times a week    Attends Religious Services: Never    Active Member of Clubs or Organizations: No    Attends Banker Meetings: Never    Marital Status: Divorced  Depression (  PHQ2-9): Low Risk (11/08/2023)   Depression (PHQ2-9)    PHQ-2 Score: 0  Alcohol Screen: Low Risk (06/08/2022)   Alcohol Screen    Last Alcohol Screening Score (AUDIT): 1  Housing: Low Risk (12/29/2023)   Epic    Unable to Pay for Housing in the Last Year: No    Number of Times Moved in the Last Year: 0    Homeless in the Last Year: No  Utilities: Not At Risk (12/29/2023)   Epic    Threatened with loss of utilities: No  Health Literacy: Not on file     Review of Systems: A 12 point ROS discussed and pertinent positives are indicated in the HPI above.  All other systems are negative.  Vital Signs: BP (!) 109/95   Pulse (!) 57   Temp 98.8 F (37.1 C)   Resp 19   Ht 5' 4 (1.626 m)   Wt 172 lb 13.5 oz (78.4 kg)   SpO2 98%   BMI 29.67 kg/m    Physical Exam Vitals reviewed.  Constitutional:      General: She is not in acute distress. HENT:     Head:  Normocephalic and atraumatic.     Nose:     Comments: Feeding tube in place  Neck:     Comments: Dressing on right neck, had allergic reaction to CHG per RN Trialysis catheter in left neck  Cardiovascular:     Rate and Rhythm: Normal rate and regular rhythm.  Pulmonary:     Effort: Pulmonary effort is normal.     Breath sounds: Normal breath sounds.  Abdominal:     General: Abdomen is flat. Bowel sounds are normal.     Palpations: Abdomen is soft.  Skin:    General: Skin is warm and dry.     Coloration: Skin is not jaundiced or pale.     Comments: Surgical staples in chest   Neurological:     Mental Status: She is alert.     MD Evaluation Airway: WNL Heart: WNL Heart  comments: Severely reduced RV function Abdomen: WNL Chest/ Lungs: WNL ASA  Classification: 3 Mallampati/Airway Score: Two  Imaging: DG Chest Port 1 View Result Date: 02/22/2024 CLINICAL DATA:  Status post mitral valve replacement. EXAM: PORTABLE CHEST 1 VIEW COMPARISON:  02/20/2024 FINDINGS: Feeding tube extending into the stomach with its tip not included. Stable median sternotomy wires, skin clips and prosthetic mitral valve. Right PICC and left jugular catheter tips in the upper right atrium, proximally 2 cm inferior to the superior cavoatrial junction. Stable mildly enlarged cardiac silhouette with decreased prominence of the pulmonary vasculature and interstitial markings and decreased size of a small right pleural effusion. Probable small left pleural effusion. Decreased airspace opacities at both lung bases with minimal bibasilar linear atelectasis. No acute bony abnormality. IMPRESSION: 1. Decreased changes of congestive heart failure with minimal residual interstitial and bibasilar alveolar edema. 2. Decreased small right pleural effusion. 3. Probable small left pleural effusion. Electronically Signed   By: Elspeth Bathe M.D.   On: 02/22/2024 14:01   ECHOCARDIOGRAM LIMITED Result Date: 02/22/2024     ECHOCARDIOGRAM LIMITED REPORT   Patient Name:   Tempest Parkin Date of Exam: 02/22/2024 Medical Rec #:  [id number]      Height:       64.0 in Accession #:    [id number]     Weight:       173.7 lb Date of Birth:  01-Sep-1955      BSA:  1.843 m Patient Age:    68 years       BP:           106/64 mmHg Patient Gender: F              HR:           77 bpm. Exam Location:  Inpatient Procedure: Cardiac Doppler, Color Doppler, Intracardiac Opacification Agent and            Limited Echo (Both Spectral and Color Flow Doppler were utilized            during procedure). Indications:    CHF-acute systolic 150.21  History:        Patient has prior history of Echocardiogram examinations, most                 recent 02/11/2024. Risk Factors:Current Smoker.  Sonographer:    Merlynn Argyle Referring Phys: [phone] [facility]  Sonographer Comments: No subcostal window and Technically difficult study due to poor echo windows. Image acquisition challenging due to respiratory motion. IMPRESSIONS  1. Technically difficult study with limited views  2. Left ventricular ejection fraction, by estimation, is 60 to 65%. The left ventricle has normal function. Apical septal hypokinesis  3. Right ventricular systolic function was not well visualized. FINDINGS  Left Ventricle: Left ventricular ejection fraction, by estimation, is 60 to 65%. The left ventricle has normal function. Definity  contrast agent was given IV to delineate the left ventricular endocardial borders. Right Ventricle: Right ventricular systolic function was not well visualized. Mitral Valve: There is a bioprosthetic valve present in the mitral position. MV peak gradient, 7.3 mmHg. The mean mitral valve gradient is 3.0 mmHg. MITRAL VALVE MV Peak grad: 7.3 mmHg MV Mean grad: 3.0 mmHg MV Vmax:      1.35 m/s MV Vmean:     86.2 cm/s Lonni Nanas MD Electronically signed by Lonni Nanas MD Signature Date/Time: 02/22/2024/1:21:31 PM    Final    DG CHEST PORT 1  VIEW Result Date: 02/20/2024 CLINICAL DATA:  Cough. EXAM: PORTABLE CHEST 1 VIEW COMPARISON:  02/19/2024 FINDINGS: Postsurgical changes in the heart. Left jugular dialysis catheter tip at the superior cavoatrial junction. Right arm PICC line tip near the superior cavoatrial junction. Heart size is grossly stable. Increased densities in the right lower lung. Blunting at the right costophrenic angle and cannot exclude right pleural fluid. Left lung is stable without focal disease. Negative for a pneumothorax. Feeding tube extends down the esophagus and the tip is beyond the image. IMPRESSION: 1. Increased densities in right lower lung. Findings are concerning for airspace disease or infection. Possible small right pleural effusion. 2. Support apparatuses as described. Negative for pneumothorax. Electronically Signed   By: Juliene Balder M.D.   On: 02/20/2024 12:14   DG Chest Port 1 View Result Date: 02/19/2024 EXAM: 1 VIEW(S) XRAY OF THE CHEST 02/19/2024 05:34:00 AM COMPARISON: 02/16/2024 CLINICAL HISTORY: S/P MVR (mitral valve replacement) FINDINGS: LINES, TUBES AND DEVICES: Endotracheal tube removed. Feeding tube in place extending beyond the inferior aspect of the film. Left internal jugular line stable in place with tip at low SVC. Stable right PICC line. LUNGS AND PLEURA: Small right pleural effusion with minimal layering left pleural fluid. Improved right base aeration. Pulmonary interstitial thickening accentuated by portable technique. Persistent bibasilar atelectasis. No pneumothorax. HEART AND MEDIASTINUM: Median sternotomy and prosthetic valve noted. BONES AND SOFT TISSUES: No acute osseous abnormality. IMPRESSION: 1. Small right pleural effusion with minimal layering left  pleural fluid. 2. Persistent bibasilar atelectasis with improved right base aeration. 3. Pulmonary interstitial thickening, likely accentuated by portable technique. Electronically signed by: Waddell Calk MD 02/19/2024 07:28 AM EST RP  Workstation: [id number]   VAS US  LOWER EXTREMITY VENOUS (DVT) Result Date: 02/18/2024  Lower Venous DVT Study Patient Name:  Wynnie Alamillo  Date of Exam:   02/18/2024 Medical Rec #: [id number]       Accession #:    [id number] Date of Birth: 01-Dec-1955       Patient Gender: F Patient Age:   15 years Exam Location:  [facility] Procedure:      VAS US  LOWER EXTREMITY VENOUS (DVT) Referring Phys: LEITA EINSTEIN --------------------------------------------------------------------------------  Indications: Edema.  Limitations: Poor ultrasound/tissue interface, body habitus and immobility. Comparison Study: No previous exams Performing Technologist: Jody Hill RVT, RDMS  Examination Guidelines: A complete evaluation includes B-mode imaging, spectral Doppler, color Doppler, and power Doppler as needed of all accessible portions of each vessel. Bilateral testing is considered an integral part of a complete examination. Limited examinations for reoccurring indications may be performed as noted. The reflux portion of the exam is performed with the patient in reverse Trendelenburg.  +---------+---------------+---------+-----------+----------+-------------------+ RIGHT    CompressibilityPhasicitySpontaneityPropertiesThrombus Aging      +---------+---------------+---------+-----------+----------+-------------------+ CFV      Full           Yes      Yes                                      +---------+---------------+---------+-----------+----------+-------------------+ SFJ      Full                                                             +---------+---------------+---------+-----------+----------+-------------------+ FV Prox                 Yes      Yes                  Not well visualized +---------+---------------+---------+-----------+----------+-------------------+ FV Mid   Full           Yes      Yes                                       +---------+---------------+---------+-----------+----------+-------------------+ FV DistalFull           Yes      Yes                                      +---------+---------------+---------+-----------+----------+-------------------+ PFV                     Yes      Yes                                      +---------+---------------+---------+-----------+----------+-------------------+ POP      Full           Yes  Yes                                      +---------+---------------+---------+-----------+----------+-------------------+ PTV      Full                                                             +---------+---------------+---------+-----------+----------+-------------------+ PERO     Full                                         Not well visualized +---------+---------------+---------+-----------+----------+-------------------+ Prox FV and PFV not well visualized due to bandage to medial proximal thigh. Patent by color/doppler only.  +---------+---------------+---------+-----------+----------+-------------------+ LEFT     CompressibilityPhasicitySpontaneityPropertiesThrombus Aging      +---------+---------------+---------+-----------+----------+-------------------+ CFV      Full           Yes      Yes                                      +---------+---------------+---------+-----------+----------+-------------------+ SFJ      Full                                                             +---------+---------------+---------+-----------+----------+-------------------+ FV Prox  Full           Yes      Yes                                      +---------+---------------+---------+-----------+----------+-------------------+ FV Mid   Full           Yes      Yes                                      +---------+---------------+---------+-----------+----------+-------------------+ FV DistalFull           Yes      Yes                                       +---------+---------------+---------+-----------+----------+-------------------+ PFV      Full                                                             +---------+---------------+---------+-----------+----------+-------------------+ POP      Full           Yes      Yes                                      +---------+---------------+---------+-----------+----------+-------------------+  PTV                                                   unable to visualize +---------+---------------+---------+-----------+----------+-------------------+ PERO                                                  unable to visualize +---------+---------------+---------+-----------+----------+-------------------+     Summary: BILATERAL: - No evidence of deep vein thrombosis seen in the lower extremities, bilaterally. -No evidence of popliteal cyst, bilaterally.   *See table(s) above for measurements and observations. Electronically signed by Fonda Rim on 02/18/2024 at 4:09:52 PM.    Final    US  RENAL Result Date: 02/16/2024 EXAM: US  Retroperitoneum Complete, Renal. 02/16/2024 04:59:00 PM TECHNIQUE: Real-time ultrasonography of the retroperitoneum renal was performed. COMPARISON: None available CLINICAL HISTORY: AKI (acute kidney injury) FINDINGS: FINDINGS: Trace perihepatic ascites. Small right pleural effusion. RIGHT KIDNEY/URETER: Right kidney measures 11.5 x 5.9 x 5.7 cm with a volume of 204 cm. Normal cortical echogenicity. No hydronephrosis. No calculus. No mass. 8 x 9 x 9 mm simple right upper pole renal cyst, benign. LEFT KIDNEY/URETER: Left kidney measures 13.0 x 5.4 x 6.3 cm with a volume of 231 cm. Normal cortical echogenicity. No hydronephrosis. No calculus. No mass. BLADDER: Bladder decompressed by an indwelling foley catheter. IMPRESSION: 1. No acute findings. 2. 9 mm simple right renal cyst, benign. 3. Bladder decompressed by indwelling Foley catheter.  Electronically signed by: Pinkie Pebbles MD 02/16/2024 05:34 PM EST RP Workstation: [id number]   DG CHEST PORT 1 VIEW Result Date: 02/16/2024 EXAM: 1 VIEW(S) XRAY OF THE CHEST 02/16/2024 05:10:48 PM COMPARISON: 02/15/2024 CLINICAL HISTORY: S/P hemodialysis catheter insertion FINDINGS: LINES, TUBES AND DEVICES: New left IJ catheter with tip over right atrium. New enteric tube with tip coursing into the mid stomach. Endotracheal tube with tip 4.7 cm above carina. Stable right upper extremity PICC with tip in mid SVC. LUNGS AND PLEURA: Stable patchy bilateral lower lung opacities, likely atelectasis. Stable trace bilateral pleural effusions. No pneumothorax. HEART AND MEDIASTINUM: Prosthetic cardiac valve noted. Epicardial pacing wires noted. BONES AND SOFT TISSUES: Right paramidline skin staples noted. No acute osseous abnormality. IMPRESSION: 1. Left internal jugular catheter with tip over the right atrium, appropriate in position. 2. Additional support apparatus as aobve. 3. Stable patchy bilateral lower lung opacities, likely atelectasis. 4. Stable trace bilateral pleural effusions. Electronically signed by: Pinkie Pebbles MD 02/16/2024 05:29 PM EST RP Workstation: [id number]   DG Chest Port 1 View Result Date: 02/15/2024 CLINICAL DATA:  CHF. EXAM: PORTABLE CHEST 1 VIEW COMPARISON:  02/14/2024 FINDINGS: Endotracheal tube tip is approximately 7.4 cm above the base of the carina. The NG tube passes into the stomach although the distal tip position is not included on the film. Lungs appear hyperexpanded. Interstitial markings are diffusely coarsened with chronic features. Basilar patchy airspace disease with tiny effusions evident. Right PICC line tip overlies the mid SVC level. Telemetry leads overlie the chest. IMPRESSION: 1. Endotracheal tube tip is approximately 7.4 cm above the base of the carina. 2. Basilar patchy airspace disease with tiny effusions. Electronically Signed   By: Camellia Candle M.D.    On: 02/15/2024 06:20   VAS US  UPPER EXTREMITY ARTERIAL  DUPLEX Result Date: 02/14/2024  UPPER EXTREMITY DUPLEX STUDY Patient Name:  Joye Clipper  Date of Exam:   02/14/2024 Medical Rec #: [id number]       Accession #:    [id number] Date of Birth: 09/28/1955       Patient Gender: F Patient Age:   29 years Exam Location:  [facility] Procedure:      VAS US  UPPER EXTREMITY ARTERIAL DUPLEX Referring Phys: LEITA GASKINS --------------------------------------------------------------------------------  Indications: Left hand cool, previous radial not pulsatile.  Comparison Study: No prior exam. Performing Technologist: Edilia Elden Appl  Examination Guidelines: A complete evaluation includes B-mode imaging, spectral Doppler, color Doppler, and power Doppler as needed of all accessible portions of each vessel. Bilateral testing is considered an integral part of a complete examination. Limited examinations for reoccurring indications may be performed as noted.  Left Doppler Findings: +--------------+----------+---------+--------+------------------------+ Site          PSV (cm/s)Waveform StenosisComments                 +--------------+----------+---------+--------+------------------------+ Subclavian Mid106       triphasic                                 +--------------+----------+---------+--------+------------------------+ Axillary      78        triphasic                                 +--------------+----------+---------+--------+------------------------+ Brachial Prox 72                                                  +--------------+----------+---------+--------+------------------------+ Brachial Mid                             Not visualized, bandage. +--------------+----------+---------+--------+------------------------+ Brachial Dist 86        triphasic                                 +--------------+----------+---------+--------+------------------------+ Radial Prox    47        biphasic                                  +--------------+----------+---------+--------+------------------------+ Radial Mid    16        biphasic                                  +--------------+----------+---------+--------+------------------------+ Radial Dist                      occluded                         +--------------+----------+---------+--------+------------------------+ Ulnar Prox    74        triphasic                                 +--------------+----------+---------+--------+------------------------+ Ulnar Mid     50  triphasic                                 +--------------+----------+---------+--------+------------------------+ Ulnar Dist    41        triphasic                                 +--------------+----------+---------+--------+------------------------+ Left distal radial artery demonstrates no flow, appears occluded. Dampened flow noted in the left middle radial artery.  Summary:  Left: Obstruction noted in the distal radial artery. *See table(s) above for measurements and observations. Electronically signed by Debby Robertson on 02/14/2024 at 10:23:04 PM.    Final    DG CHEST PORT 1 VIEW Result Date: 02/14/2024 CLINICAL DATA:  Hypoxia. EXAM: PORTABLE CHEST 1 VIEW COMPARISON:  02/13/2024. FINDINGS: Endotracheal tube tip is 6.3 cm above the base of the carina. The NG tube passes into the stomach although the distal tip position is not included on the film. The cardio pericardial silhouette is enlarged. Diffuse interstitial opacity evident with persistent bibasilar atelectasis/infiltrate. Bilateral pleural drain seen previously are not evident. No pneumothorax although right apex is somewhat obscured by overlying clothing/bed sheets. Right PICC line tip overlies the mid SVC level. Left IJ sheath remains in place. IMPRESSION: 1. Endotracheal tube tip is 6.3 cm above the base of the carina. 2. Persistent bibasilar  atelectasis/infiltrate. 3. Diffuse interstitial opacity suggests edema. Electronically Signed   By: Camellia Candle M.D.   On: 02/14/2024 09:13   DG Abd 1 View Result Date: 02/14/2024 CLINICAL DATA:  NG tube placement. EXAM: ABDOMEN - 1 VIEW COMPARISON:  02/10/2024 FINDINGS: NG tube tip is positioned at the level of the GE junction. Tube could be advanced approximately 10 cm to place the side port below the GE junction. Nonspecific bowel gas pattern within the visualized upper abdomen. IMPRESSION: NG tube tip is positioned at the level of the GE junction. Tube could be advanced approximately 10 cm to place the side port below the GE junction. Repeat x-ray after repositioning recommended. Electronically Signed   By: Camellia Candle M.D.   On: 02/14/2024 09:11   DG CHEST PORT 1 VIEW Result Date: 02/13/2024 CLINICAL DATA:  Hypoxemia. EXAM: PORTABLE CHEST 1 VIEW COMPARISON:  02/12/2024 FINDINGS: Stable borderline enlarged cardiac silhouette, median sternotomy wires and prosthetic mitral valve. Endotracheal tube in satisfactory position. Nasogastric tube extending into the stomach with its side hole in the proximal stomach and tip not included. Left jugular Swan-Ganz catheter sheath with its tip in the left innominate vein. Right PICC tip in the inferior aspect of the superior vena cava approximately 2.5 cm above the superior cavoatrial junction. Bilateral chest tubes are unchanged with no pneumothorax seen. Decreased bibasilar and right mid lung zone atelectasis. Skin clips. No acute bony abnormality. IMPRESSION: 1. Decreased bibasilar and right mid lung zone atelectasis. 2. Stable borderline cardiomegaly. 3. Support tubes and lines in satisfactory position. Electronically Signed   By: Elspeth Bathe M.D.   On: 02/13/2024 13:38   ECHOCARDIOGRAM COMPLETE Result Date: 02/12/2024    ECHOCARDIOGRAM REPORT   Patient Name:   Katera Almon Date of Exam: 02/11/2024 Medical Rec #:  [id number]      Height:       64.0 in  Accession #:    [id number]     Weight:       181.4 lb Date of Birth:  [birth year]  BSA:          1.877 m Patient Age:    68 years       BP:           102/67 mmHg Patient Gender: F              HR:           89 bpm. Exam Location:  Inpatient Procedure: 2D Echo (Both Spectral and Color Flow Doppler were utilized during            procedure). Indications:    CHF  History:        Patient has prior history of Echocardiogram examinations, most                 recent 01/22/2024. Endocarditis and Mitral Valve Disease.                  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 Procedure Date: 02/08/2024.  Sonographer:    Jayson Gaskins Referring Phys: [phone] AMY D CLEGG IMPRESSIONS  1. Left ventricular ejection fraction, by estimation, is 50 to 55%. The left ventricle has low normal function. The left ventricle demonstrates regional wall motion abnormalities due to septal pacing. Left ventricular diastolic function could not be evaluated.  2. Right ventricular systolic function is mildly reduced. The right ventricular size is mildly enlarged.  3. The mitral valve has been repaired/replaced. No evidence of mitral valve regurgitation. No evidence of mitral stenosis. There is a bioprosthetic valve present in the mitral position. Procedure Date: 02/08/2024. Echo findings are consistent with normal  structure and function of the mitral valve prosthesis.  4. The aortic valve is normal in structure. Aortic valve regurgitation is not visualized. No aortic stenosis is present.  5. The inferior vena cava is normal in size with greater than 50% respiratory variability, suggesting right atrial pressure of 3 mmHg. FINDINGS  Left Ventricle: Left ventricular ejection fraction, by estimation, is 50 to 55%. The left ventricle has low normal function. The left ventricle demonstrates regional wall motion abnormalities. The left ventricular internal cavity size was normal in size. There is no left  ventricular hypertrophy. Abnormal (paradoxical) septal motion, consistent with RV pacemaker. Left ventricular diastolic function could not be evaluated due to paced rhythm. Left ventricular diastolic function could not be evaluated. Right Ventricle: The right ventricular size is mildly enlarged. No increase in right ventricular wall thickness. Right ventricular systolic function is mildly reduced. Left Atrium: Left atrial size was normal in size. Right Atrium: Right atrial size was normal in size. Pericardium: There is no evidence of pericardial effusion. Mitral Valve: The mitral valve has been repaired/replaced. No evidence of mitral valve regurgitation. There is a bioprosthetic valve present in the mitral position. Procedure Date: 02/08/2024. Echo findings are consistent with normal structure and function of the mitral valve prosthesis. No evidence of mitral valve stenosis. MV peak gradient, 8.2 mmHg. The mean mitral valve gradient is 2.0 mmHg. Tricuspid Valve: The tricuspid valve is grossly normal. Tricuspid valve regurgitation is mild . No evidence of tricuspid stenosis. Aortic Valve: The aortic valve is normal in structure. Aortic valve regurgitation is not visualized. No aortic stenosis is present. Aortic valve mean gradient measures 2.0 mmHg. Aortic valve peak gradient measures 2.8 mmHg. Pulmonic Valve: The pulmonic valve was normal in structure. Pulmonic valve regurgitation is not visualized. No evidence of  pulmonic stenosis. Aorta: The aortic root is normal in size and structure. Venous: The inferior vena cava is normal in size with greater than 50% respiratory variability, suggesting right atrial pressure of 3 mmHg. IAS/Shunts: The interatrial septum was not well visualized.   Diastology LV e' medial:    5.33 cm/s LV E/e' medial:  22.3 LV e' lateral:   4.90 cm/s LV E/e' lateral: 24.3  LEFT ATRIUM           Index LA Vol (A4C): 38.2 ml 20.35 ml/m  AORTIC VALVE AV Vmax:           84.10 cm/s AV Vmean:           66.900 cm/s AV VTI:            0.098 m AV Peak Grad:      2.8 mmHg AV Mean Grad:      2.0 mmHg LVOT Vmax:         87.00 cm/s LVOT Vmean:        63.600 cm/s LVOT VTI:          0.111 m LVOT/AV VTI ratio: 1.14 MITRAL VALVE MV Area (PHT): 4.10 cm     SHUNTS MV Peak grad:  8.2 mmHg     Systemic VTI: 0.11 m MV Mean grad:  2.0 mmHg MV Vmax:       1.43 m/s MV Vmean:      66.1 cm/s MV Decel Time: 185 msec MV E velocity: 119.00 cm/s MV A velocity: 56.10 cm/s MV E/A ratio:  2.12 Morene Brownie Electronically signed by Morene Brownie Signature Date/Time: 02/12/2024/1:45:18 PM    Final    DG Chest Port 1 View Result Date: 02/12/2024 CLINICAL DATA:  Mitral valve replacement. EXAM: PORTABLE CHEST 1 VIEW COMPARISON:  02/11/2024 FINDINGS: Endotracheal tube tip is 2.9 cm above the base of the carina. The NG tube passes into the stomach although the distal tip position is not included on the film. Left IJ pulmonary artery catheter tip is in the main pulmonary outflow tract. ECMO cannula has been removed in the interval.Right PICC line and bilateral chest tubes remain in place. Patchy airspace disease in the right lung and left base is similar with probable layering effusions. IMPRESSION: Interval removal of ECMO cannula. Otherwise no substantial change. Electronically Signed   By: Camellia Candle M.D.   On: 02/12/2024 06:34   ECHO INTRAOPERATIVE TEE Result Date: 02/11/2024  *INTRAOPERATIVE TRANSESOPHAGEAL REPORT *  Patient Name:   Foster Bartolini Date of Exam: 02/08/2024 Medical Rec #:  [id number]      Height:       64.0 in Accession #:    [id number]     Weight:       141.8 lb Date of Birth:  1955/04/18      BSA:          1.69 m Patient Age:    68 years       BP:           115/69 mmHg Patient Gender: F              HR:           84 bpm. Exam Location:  Anesthesiology Transesophogeal exam was perform intraoperatively during surgical procedure. Patient was closely monitored under general anesthesia during the entirety of examination.  Indications:     MVR Performing Phys: [id number] STEVEN C HENDRICKSON Diagnosing Phys: Cordella Stoltzfus Complications: No known complications during this procedure. POST-OP IMPRESSIONS _ Left Ventricle:  The left ventricle is unchanged from pre-bypass. _ Aorta: The aorta appears unchanged from pre-bypass. _ Left Atrial Appendage: The left atrial appendage appears unchanged from pre-bypass. _ Aortic Valve: The aortic valve appears unchanged from pre-bypass. _ Mitral Valve: A bioprosthetic valve was placed, leaflets are freely mobile Size; 27 mm. Tiny buttonhole perivalvular leak post replacement _ Tricuspid Valve: The tricuspid valve appears unchanged from pre-bypass. _ Pulmonic Valve: The pulmonic valve appears unchanged from pre-bypass. _ Interatrial Septum: The interatrial septum appears unchanged from pre-bypass. _ Pericardium: The pericardium appears unchanged from pre-bypass. PRE-OP FINDINGS  Left Ventricle: The left ventricle has normal systolic function, with an ejection fraction of 60-65%. The cavity size was normal. No evidence of left ventricular regional wall motion abnormalities. There is no left ventricular hypertrophy. Left ventricular diastolic parameters were normal. Right Ventricle: The right ventricle has mildly reduced systolic function. The cavity was normal. There is no increase in right ventricular wall thickness. Right ventricular systolic pressure is mildly elevated. Left Atrium: Left atrial size was dilated. No left atrial/left atrial appendage thrombus was detected. Left atrial appendage velocity is reduced at less than 40 cm/s. Right Atrium: Right atrial size was normal in size. Interatrial Septum: No atrial level shunt detected by color flow Doppler. There is no evidence of a patent foramen ovale. IAS thickened s/p repair when crossed with initial MV repair 11/25 with notable filamentous material on LA side of primum. CFD and 3D analysis of septum WNL. Pericardium: There is no evidence of  pericardial effusion. There is a moderate pleural effusion in the left lateral region. Mitral Valve: The mitral valve has been repaired/replaced s/p 01/10/24 with annuloplasty ring. Mitral valve regurgitation is severe by color flow Doppler. The MR jet is posteriorly-directed. There is no evidence of mitral valve vegetation. Pulmonary venous flow shows systolic flow reversal. Shortening involving the posterior leaflet with resstricted excursion is noted. There is No evidence of mitral stenosis. PISA EROA 1.34cm2, RV 210cc. Tricuspid Valve: The tricuspid valve was normal in structure. Tricuspid valve regurgitation is mild by color flow Doppler. The jet is directed centrally. No evidence of tricuspid stenosis is present. There is no evidence of tricuspid valve vegetation. Aortic Valve: The aortic valve is tricuspid Aortic valve regurgitation was not visualized by color flow Doppler. There is no stenosis of the aortic valve, with a calculated valve area of 1.29 cm. There is no evidence of aortic valve vegetation. Pulmonic Valve: The pulmonic valve was normal in structure, with normal. No evidence of pumonic stenosis. Pulmonic valve regurgitation is trivial by color flow Doppler. Aorta: The aortic root and ascending aorta are normal in size and structure. Pulmonary Artery: Norva Purl catheter present on the right. The pulmonary artery is of normal size. Shunts: There is no evidence of an atrial septal defect. +--------------+--------++ LEFT VENTRICLE         +--------------+--------++ PLAX 2D                +--------------+--------++ LVOT diam:    1.80 cm  +--------------+--------++ LVOT Area:    2.54 cm +--------------+--------++                        +--------------+--------++ +---------------+------+-------+ RIGHT VENTRICLE              +---------------+------+-------+ TAPSE (M-mode):1.7 cm2.37 cm +---------------+------+-------+ +------------------+-----------++ AORTIC VALVE                   +------------------+-----------++ AV Area (Vmax):   1.81  cm    +------------------+-----------++ AV Area (Vmean):  1.75 cm    +------------------+-----------++ AV Area (VTI):    1.29 cm    +------------------+-----------++ AV Vmax:          140.00 cm/s +------------------+-----------++ AV Vmean:         82.800 cm/s +------------------+-----------++ AV VTI:           0.240 m     +------------------+-----------++ AV Peak Grad:     7.8 mmHg    +------------------+-----------++ AV Mean Grad:     3.0 mmHg    +------------------+-----------++ LVOT Vmax:        99.85 cm/s  +------------------+-----------++ LVOT Vmean:       56.850 cm/s +------------------+-----------++ LVOT VTI:         0.122 m     +------------------+-----------++ LVOT/AV VTI ratio:0.51        +------------------+-----------++  +--------------+-------++ AORTA                 +--------------+-------++ Ao Sinus diam:2.90 cm +--------------+-------++ Ao STJ diam:  2.4 cm  +--------------+-------++ Ao Asc diam:  2.70 cm +--------------+-------++ +-------------+---------++ MITRAL VALVE                 +--------------+-------+ +-------------+---------++       SHUNTS                MV Peak grad:8.5 mmHg        +--------------+-------+ +-------------+---------++       Systemic VTI: 0.12 m  MV Mean grad:3.5 mmHg        +--------------+-------+ +-------------+---------++       Systemic Diam:1.80 cm MV Vmax:     1.46 m/s        +--------------+-------+ +-------------+---------++ MV Vmean:    92.8 cm/s +-------------+---------++ MV VTI:      0.38 m    +-------------+---------++ +----------------+-----------++ MR Peak grad:   159.8 mmHg  +----------------+-----------++ MR Mean grad:   89.0 mmHg   +----------------+-----------++ MR Vmax:        632.00 cm/s +----------------+-----------++ MR Vmean:       427.0 cm/s  +----------------+-----------++  MR PISA:        25.13 cm   +----------------+-----------++ MR PISA Eff ROA:134 mm     +----------------+-----------++ MR PISA Radius: 2.00 cm     +----------------+-----------++  Cordella Fix Electronically signed by Cordella Fix Signature Date/Time: 02/11/2024/12:06:48 PM    Final    DG CHEST PORT 1 VIEW Result Date: 02/11/2024 CLINICAL DATA:  r ECMO. EXAM: PORTABLE CHEST 1 VIEW COMPARISON:  02/10/2024 FINDINGS: Endotracheal tube tip is 3.7 cm above the base of the carina. The NG tube passes into the stomach although the distal tip position is not included on the film. ECMO cannula remains in place. Right PICC line tip is obscured over the mediastinum by support apparatus. Left IJ pulmonary artery catheter tip overlies the main pulmonary outflow tract. Bilateral chest tubes again noted with midline mediastinal/pericardial drain evident. No pneumothorax. Similar diffuse airspace opacity in the right mid and upper lung with streaky density in both lung bases as before. IMPRESSION: 1. No substantial interval change. 2. Support apparatus as described. 3. Similar diffuse airspace opacity in the right mid and upper lung. Electronically Signed   By: Camellia Candle M.D.   On: 02/11/2024 05:44   DG Chest Port 1 View in am Result Date: 02/10/2024 EXAM: 1 VIEW(S) XRAY OF THE CHEST 02/10/2024 07:10:00 AM COMPARISON: 02/09/2024 CLINICAL HISTORY: Encounter for orogastric (OG) tube placement FINDINGS: LINES,  TUBES AND DEVICES: Endotracheal tube in place with tip 3.8 cm above the carina. Nasogastric tube in place with tip and side port projecting over the stomach. Stable left IJ central venous catheter, bilateral chest tubes, mediastinal drain, ECMO catheter and right upper extremity PICC. LUNGS AND PLEURA: Slightly increased diffuse interstitial opacities and right upper lung airspace opacity. Moderate right pleural effusion. No pneumothorax. HEART AND MEDIASTINUM: Sternotomy wires and prosthetic  cardiac valve noted. BONES AND SOFT TISSUES: No acute osseous abnormality. IMPRESSION: 1. Slightly increased diffuse interstitial and right upper lung airspace opacities Electronically signed by: Waddell Calk MD 02/10/2024 08:10 AM EST RP Workstation: [id number]   DG Abd 1 View Result Date: 02/10/2024 EXAM: 1 VIEW XRAY OF THE ABDOMEN 02/10/2024 07:09:00 AM COMPARISON: Chest radiograph 02/09/2024 CLINICAL HISTORY: Encounter for orogastric (OG) tube placement. FINDINGS: LINES, TUBES AND DEVICES: Enteric tube in place with tip and side port projecting over the stomach. Swan-Ganz catheter in place with tip visualized overlying the main pulmonary artery. Mediastinal drain noted. Bilateral chest tubes noted. Midline skin staples noted. ECMO catheter noted. BOWEL: Nonobstructive bowel gas pattern. SOFT TISSUES: No abnormal calcifications. BONES: No acute fracture. IMPRESSION: 1. Enteric tube in appropriate position with tip and side port projecting over the stomach. 2. Swan-Ganz catheter tip overlying the main pulmonary artery in appropriate position. 3. Mediastinal drain and bilateral chest tubes in appropriate positions. Electronically signed by: Waddell Calk MD 02/10/2024 08:06 AM EST RP Workstation: [id number]   DG Chest Port 1 View Result Date: 02/09/2024 CLINICAL DATA:  Endotracheal intubation. EXAM: PORTABLE CHEST 1 VIEW COMPARISON:  02/09/2024. FINDINGS: The heart size and mediastinal contours are stable. A prosthetic cardiac valve is noted. Increased interstitial and airspace opacities are noted on the right. The left lung is stable. No effusion or pneumothorax is seen. The endotracheal tube terminates 3.4 cm above the carina. An enteric tube courses over the left upper quadrant and out of the field of view. Sternotomy wires and surgical clips course over the midline. A right PICC line, left Swan-Ganz catheter, bilateral chest tubes and mediastinal drain are stable. A new right tubular device is noted  terminating in the hilar region on the left. IMPRESSION: 1. Interstitial and airspace opacities in the lungs bilaterally, increased on the right from the prior exam. 2. Postoperative lines and tubes as described above. Electronically Signed   By: Leita Waddell M.D.   On: 02/09/2024 16:33   CARDIAC CATHETERIZATION Result Date: 02/09/2024 Successful Protek Duo Cannulation   DG Chest Port 1 View Result Date: 02/09/2024 EXAM: 1 VIEW(S) XRAY OF THE CHEST 02/09/2024 06:03:10 AM COMPARISON: 02/08/2024 CLINICAL HISTORY: S/P MVR (mitral valve replacement) FINDINGS: LINES, TUBES AND DEVICES: Endotracheal tube in place with tip 2.6 cm above carina. Enteric tube in place with tip and side-port in stomach. Right IJ Swan-Ganz catheter in place with tip overlying main pulmonary artery. Bilateral chest tubes and mediastinal drain in place. LUNGS AND PLEURA: Small bilateral pleural effusions with bibasilar atelectasis. Mild diffuse interstitial opacities. No pneumothorax. HEART AND MEDIASTINUM: Prosthetic mitral valve noted. No acute abnormality of the cardiac and mediastinal silhouettes. BONES AND SOFT TISSUES: Midline skin staples noted. Sternotomy wires noted. No acute osseous abnormality. IMPRESSION: 1. Small bilateral pleural effusions with bibasilar atelectasis and mild diffuse interstitial opacities. Electronically signed by: Waddell Calk MD 02/09/2024 07:02 AM EST RP Workstation: [id number]   DG Chest Port 1 View Result Date: 02/08/2024 EXAM: 1 VIEW(S) XRAY OF THE CHEST 02/08/2024 02:38:00 PM COMPARISON: 02/04/2024 CLINICAL HISTORY: Postop day 0 status  post mitral valve replacement. FINDINGS: LINES, TUBES AND DEVICES: Endotracheal tube in place with tip 5 cm above carina. Enteric tube in place with tip and side-port below diaphragm. Right IJ PA catheter in place with tip projecting over main pulmonary artery. Bilateral chest tubes and mediastinal drain present. LUNGS AND PLEURA: Trace bilateral pleural effusions.  Mild blunting of the left lateral costophrenic angle, small left pleural effusion not excluded. Bibasilar linear opacities, likely atelectasis. Mild pulmonary edema. No pneumothorax. HEART AND MEDIASTINUM: Prosthetic cardiac valve present (mitral valve prosthesis noted). Epicardial pacer leads noted. BONES AND SOFT TISSUES: Midline skin staples present. Nondisplaced median sternotomy wires. IMPRESSION: 1. Mild pulmonary edema. 2. Trace bilateral pleural effusions with possible small left effusion. 3. Bibasilar atelectasis. 4. Postoperative support devices and prosthetic cardiac valve in expected positions. Electronically signed by: Ryan Salvage MD 02/08/2024 03:02 PM EST RP Workstation: [id number]   CARDIAC CATHETERIZATION Result Date: 02/05/2024 HEMODYNAMICS: RA:       4 mmHg (mean) RV:       42/2, 6 mmHg PA:       42/12 mmHg (22 mean) PCWP: 12 mmHg (mean) with v waves to 20    Estimated Fick CO/CI   5.54L/min, 3.33L/min/m2 Thermodilution CO/CI   3.37L/min, 2.03L/min/m2    TPG  10  mmHg     PVR  2.9 Wood Units PAPi  7.5  IMPRESSION: Right heart catheterization on 0.125 of milrinone  Normal right and left heart filling pressures Evidence of significant MR by PCWP tracing Moderately reduced output by thermodilution, would use over Fick given significant anemia RECOMMENDATIONS: Discussed with CT surgery, plan for continued inotropes, hold diuresis, and surgery when able, potentially later in the week   DG Chest Port 1 View Result Date: 02/04/2024 CLINICAL DATA:  Status post mitral valve repair on 01/10/2024. EXAM: PORTABLE CHEST 1 VIEW COMPARISON:  01/29/2024 FINDINGS: The cardiac silhouette remains borderline enlarged. Mildly improved left basilar atelectasis and significantly improved right basilar atelectasis. Small bilateral pleural effusions with mild improvement. Decreased depth of inspiration with increased prominence of the pulmonary vasculature and interstitial markings. Diffuse osteopenia. Stable  median sternotomy wires. Right PICC tip in the region of the superior cavoatrial junction. IMPRESSION: 1. Mildly improved left basilar atelectasis and significantly improved right basilar atelectasis. 2. Small bilateral pleural effusions with mild improvement. 3. Decreased depth of inspiration with increased prominence of the pulmonary vasculature and interstitial markings. This could be due to the decreased depth of inspiration or mild CHF. Electronically Signed   By: Elspeth Bathe M.D.   On: 02/04/2024 14:16   DG Chest 2 View Result Date: 01/29/2024 EXAM: 2 VIEW(S) XRAY OF THE CHEST 01/29/2024 03:29:15 AM COMPARISON: Portable chest 01/19/2024. CLINICAL HISTORY: [id number] S/P MVR (mitral valve repair) [id number] S/P MVR (mitral valve repair) FINDINGS: LINES, TUBES AND DEVICES: Right PICC again terminates at the superior cavoatrial junction. LUNGS AND PLEURA: Small bilateral layering pleural effusions with overlying linear opacities atelectatic foci. The lungs are otherwise clear. No pneumothorax. HEART AND MEDIASTINUM: The cardiac size is upper limits of normal. The mediastinum is normally outlined. If there is an MVR in place, it is not radiographically visible. BONES AND SOFT TISSUES: There are intact sternotomy sutures. There is osteopenia, kyphosis, and mild degenerative change of the thoracic spine. IMPRESSION: 1. Small bilateral layering pleural effusions with overlying atelectasis. 2. Cardiac size upper limits of normal. No findings of acute chf. Electronically signed by: Francis Quam MD 01/29/2024 03:38 AM EST RP Workstation: [id number]    Labs:  CBC: Recent  Labs    02/21/24 0435 02/21/24 1627 02/22/24 0452 02/23/24 0455  WBC 8.4 8.6 7.6 6.4  HGB 10.5* 10.3* 10.6* 9.7*  HCT 33.1* 32.8* 33.3* 30.2*  PLT 88* 96* 102* 111*    COAGS: Recent Labs    02/08/24 1400 02/09/24 1310 02/10/24 0511 02/10/24 0700 02/10/24 1239 02/11/24 0509 02/12/24 0410 02/13/24 0339  INR 2.5* 3.9*   < > 5.3*  3.1* 1.6* 1.3* 1.2  APTT 39* >200*  --  38* 37*  --   --   --    < > = values in this interval not displayed.    BMP: Recent Labs    02/21/24 0435 02/21/24 1627 02/22/24 0452 02/23/24 0455  NA 136 134* 134* 133*  K 4.8 4.9 4.0 4.5  CL 98 97* 95* 95*  CO2 25 23 27 24   GLUCOSE 201* 137* 124* 174*  BUN 65* 75* 47* 81*  CALCIUM  8.7* 9.1 9.1 9.0  CREATININE 2.02* 2.35* 1.79* 2.64*  GFRNONAA 26* 22* 30* 19*    LIVER FUNCTION TESTS: Recent Labs    02/17/24 0423 02/17/24 1617 02/20/24 0400 02/21/24 0435 02/21/24 1627 02/22/24 0452 02/23/24 0455  BILITOT 1.3*  --  1.1  --   --  1.1 1.0  AST 27  --  33  --   --  25 21  ALT 12  --  20  --   --  19 16  ALKPHOS 209*  --  273*  --   --  270* 263*  PROT 5.9*  --  5.7*  --   --  5.8* 5.8*  ALBUMIN  2.5*  2.5*   < > 3.3* 3.2* 3.4* 3.3*  3.3* 3.4*  3.4*   < > = values in this interval not displayed.    TUMOR MARKERS: No results for input(s): AFPTM, CEA, CA199, CHROMGRNA in the last 8760 hours.  Assessment and Plan: 68 y.o. female with MV replacement on 02/08/24, AKI with no renal recovery who is in need of long term HD.   VSS CBC no leukocytosis, hgb trending down  AC/AP: heparin  gtt - will probably just need to be turn off before transport to IR . Per performing IR rad, will discuss on Monday AM Allergies reviewed allergic to chlorhexidine   Abx 2g ancef  to be given in IR, signed and held.   Risks and benefits discussed with the patient including, but not limited to bleeding, infection, vascular injury, pneumothorax which may require chest tube placement, air embolism or even death  All of the patient's questions were answered, patient is agreeable to proceed. Consent signed and in IR.   Tentatively scheduled for Monday pending IR schedule. Patient is scheduled for HD on Monday.   PLAN - TF to be held Monday MN. Medicine can be given with small amount of water . - OK to hold heparin  if needed for the procedure per  CTS.    Thank you for this interesting consult.  I greatly enjoyed meeting Mersadie Kavanaugh and look forward to participating in their care.  A copy of this report was sent to the requesting provider on this date.  Electronically Signed: Toya VEAR Cousin, PA-C 02/23/2024, 12:15 PM   I spent a total of 40 Minutes    in face to face in clinical consultation, greater than 50% of which was counseling/coordinating care for tunneled HD catheter placement.   This chart was dictated using voice recognition software.  Despite best efforts to proofread,  errors can occur which can change  the documentation meaning.   "

## 2024-02-23 NOTE — Progress Notes (Signed)
 02/23/2024 No events Somnolent yesterday per RN, wondering if seroquel  can be at bedtime only: this was switched. Otherwise no changes, iHD today, push mobility and PO. Remains on low dose dobutamine , SvO2 in 50s.  Rolan Sharps MD PCCM

## 2024-02-23 NOTE — Progress Notes (Signed)
" °  Subjective: Sat in a chair for 2 hours yesterday, still requiring lift to get out of bed Co-ox 56 on dobutamine  2  Objective: Vital signs in last 24 hours: BP 127/67 (BP Location: Left Wrist)   Pulse (!) 54   Temp 98.4 F (36.9 C) (Bladder)   Resp 19   Ht 5' 4 (1.626 m)   Wt 78.4 kg   SpO2 98%   BMI 29.67 kg/m  Filed Weights   02/21/24 1650 02/22/24 0500 02/23/24 0500  Weight: 76 kg 78.8 kg 78.4 kg    Hemodynamic parameters for last 24 hours: CVP:  [6 mmHg-9 mmHg] 7 mmHg  Intake/Output from previous day: 12/19 0701 - 12/20 0700 In: 1852.1 [P.O.:170; I.V.:317.1; NG/GT:1165; IV Piggyback:200] Out: 135 [Urine:135] Intake/Output this shift: Total I/O In: 12.9 [I.V.:12.9] Out: 0   Physical Exam: General - Resting comfortably in bed CV - Afib Resp - Unlabored on room air Abd - Soft, ND/NT Ext - Moderate edema  Lab Results:    Latest Ref Rng & Units 02/23/2024    4:55 AM 02/22/2024    4:52 AM 02/21/2024    4:27 PM  CBC  WBC 4.0 - 10.5 K/uL 6.4  7.6  8.6   Hemoglobin 12.0 - 15.0 g/dL 9.7  89.3  89.6   Hematocrit 36.0 - 46.0 % 30.2  33.3  32.8   Platelets 150 - 400 K/uL 111  102  96       Latest Ref Rng & Units 02/23/2024    4:55 AM 02/22/2024    4:52 AM 02/21/2024    4:27 PM  CMP  Glucose 70 - 99 mg/dL 825  875  862   BUN 8 - 23 mg/dL 81  47  75   Creatinine 0.44 - 1.00 mg/dL 7.35  8.20  7.64   Sodium 135 - 145 mmol/L 133  134  134   Potassium 3.5 - 5.1 mmol/L 4.5  4.0  4.9   Chloride 98 - 111 mmol/L 95  95  97   CO2 22 - 32 mmol/L 24  27  23    Calcium  8.9 - 10.3 mg/dL 9.0  9.1  9.1   Total Protein 6.5 - 8.1 g/dL 5.8  5.8    Total Bilirubin 0.0 - 1.2 mg/dL 1.0  1.1    Alkaline Phos 38 - 126 U/L 263  270    AST 15 - 41 U/L 21  25    ALT 0 - 44 U/L 16  19      CXR: No new CXR  Assessment/Plan: S/P Procedures (LRB): MVR on 12/5, protek placed on 12/6 and decannulated on 12/8 Continue dobutamine  per heart failure iHD today and Monday per  renal Continue aggressive PT PO intake as tolerated, continue cycled tube feeds   LOS: 52 days    Con RAMAN Kimmie Berggren 02/23/2024  "

## 2024-02-23 NOTE — Progress Notes (Signed)
 " Tracy Baker Progress Note    Assessment/ Plan:   AKI -b/l Cr 0.4-0.8. 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 CRRT started 12/13 to help offload volume and given encephalopathy/uremia. Was unable to wean from ventilator. Now extubated 12/15. Stopped CRRT 12/16. Failed lasix  challenge 12/17. Failed again on 12/18 with higher dose of lasix  and metolazone . s/p HD 12/18 -plan for HD today. Will have her on a TTS schedule moving forward, next HD Monday given holiday sched. No evidence of renal recovery at this junction but will continue to monitor for this -will consult IR for [facility] placement (nonurgent) -Avoid nephrotoxic medications including NSAIDs and iodinated intravenous contrast exposure unless the latter is absolutely indicated.  Preferred narcotic agents for pain control are hydromorphone , fentanyl , and methadone . Morphine should not be used. Avoid Baclofen and avoid oral sodium phosphate  and magnesium  citrate based laxatives / bowel preps. Continue strict Input and Output monitoring. 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 On fluconazole  -on dobuatmine  AHRF -extubated on 12/15 -currently stable -UF as tolerated with HD  Anemia -transfuse PRN for Hgb <7 -avoid IV Fe -Hgb 9.7  Thombocytopenia -w/u per primary service  Discussed with CCM, primary service, and ICU RN  FYI -- Holiday Schedule for next week Usual TTS schedule -> Mon (22), Wed (24), Sat (27)  Subjective:   Patient seen and examined in room. No acute events. Denies any CP/SOB. Remains weak   Objective:   BP 127/67 (BP Location: Left Wrist)   Pulse (!) 54   Temp 98.4 F (36.9 C) (Bladder)   Resp 19   Ht 5' 4 (1.626 m)   Wt 78.4 kg   SpO2 98%   BMI 29.67 kg/m   Intake/Output Summary  (Last 24 hours) at 02/23/2024 0849 Last data filed at 02/23/2024 0800 Gross per 24 hour  Intake 1792.01 ml  Output 115 ml  Net 1677.01 ml   Weight change: 2.4 kg  Physical Exam: Gen: ill/tired appearing, NAD CVS: RRR Resp: cta bl, unlabored, normal wob Abd: soft Ext: wrapped LE's with some dependent edema Neuro: awake, alert Dialysis access: LIJ temp HD cath  Imaging: [facility] Chest Port 1 View Result Date: 02/22/2024 CLINICAL DATA:  Status post mitral valve replacement. EXAM: PORTABLE CHEST 1 VIEW COMPARISON:  02/20/2024 FINDINGS: Feeding tube extending into the stomach with its tip not included. Stable median sternotomy wires, skin clips and prosthetic mitral valve. Right PICC and left jugular catheter tips in the upper right atrium, proximally 2 cm inferior to the superior cavoatrial junction. Stable mildly enlarged cardiac silhouette with decreased prominence of the pulmonary vasculature and interstitial markings and decreased size of a small right pleural effusion. Probable small left pleural effusion. Decreased airspace opacities at both lung bases with minimal bibasilar linear atelectasis. No acute bony abnormality. IMPRESSION: 1. Decreased changes of congestive heart failure with minimal residual interstitial and bibasilar alveolar edema. 2. Decreased small right pleural effusion. 3. Probable small left pleural effusion. Electronically Signed   By: Elspeth Bathe M.D.   On: 02/22/2024 14:01   ECHOCARDIOGRAM LIMITED Result Date: 02/22/2024    ECHOCARDIOGRAM LIMITED REPORT   Patient Name:   Tracy Baker Date of Exam: 02/22/2024 Medical Rec #:  [id number]      Height:       64.0  in Accession #:    [id number]     Weight:       173.7 lb Date of Birth:  Jun 17, 1955      BSA:          1.843 m Patient Age:    68 years       BP:           106/64 mmHg Patient Gender: F              HR:           77 bpm. Exam Location:  Inpatient Procedure: Cardiac Doppler, Color Doppler, Intracardiac Opacification  Agent and            Limited Echo (Both Spectral and Color Flow Doppler were utilized            during procedure). Indications:    CHF-acute systolic 150.21  History:        Patient has prior history of Echocardiogram examinations, most                 recent 02/11/2024. Risk Factors:Current Smoker.  Sonographer:    Merlynn Argyle Referring Phys: [phone] [facility]  Sonographer Comments: No subcostal window and Technically difficult study due to poor echo windows. Image acquisition challenging due to respiratory motion. IMPRESSIONS  1. Technically difficult study with limited views  2. Left ventricular ejection fraction, by estimation, is 60 to 65%. The left ventricle has normal function. Apical septal hypokinesis  3. Right ventricular systolic function was not well visualized. FINDINGS  Left Ventricle: Left ventricular ejection fraction, by estimation, is 60 to 65%. The left ventricle has normal function. Definity  contrast agent was given IV to delineate the left ventricular endocardial borders. Right Ventricle: Right ventricular systolic function was not well visualized. Mitral Valve: There is a bioprosthetic valve present in the mitral position. MV peak gradient, 7.3 mmHg. The mean mitral valve gradient is 3.0 mmHg. MITRAL VALVE MV Peak grad: 7.3 mmHg MV Mean grad: 3.0 mmHg MV Vmax:      1.35 m/s MV Vmean:     86.2 cm/s Lonni Nanas MD Electronically signed by Lonni Nanas MD Signature Date/Time: 02/22/2024/1:21:31 PM    Final      Labs: BMET Recent Labs  Lab 02/19/24 0500 02/19/24 1654 02/20/24 0400 02/21/24 0435 02/21/24 1627 02/22/24 0452 02/23/24 0455  NA 139 138 138 136 134* 134* 133*  K 4.4 4.8 4.9 4.8 4.9 4.0 4.5  CL 102 102 103 98 97* 95* 95*  CO2 27 26 25 25 23 27 24   GLUCOSE 149* 100* 108* 201* 137* 124* 174*  BUN 24* 40* 51* 65* 75* 47* 81*  CREATININE 0.83 1.25* 1.58* 2.02* 2.35* 1.79* 2.64*  CALCIUM  8.7* 8.9 8.9 8.7* 9.1 9.1 9.0  PHOS 3.8 3.4 4.0 4.8* 5.1*  4.0 5.1*   CBC Recent Labs  Lab 02/21/24 0435 02/21/24 1627 02/22/24 0452 02/23/24 0455  WBC 8.4 8.6 7.6 6.4  HGB 10.5* 10.3* 10.6* 9.7*  HCT 33.1* 32.8* 33.3* 30.2*  MCV 99.7 99.1 99.1 98.4  PLT 88* 96* 102* 111*    Medications:     amiodarone   400 mg Per Tube BID   clonazePAM   0.5 mg Per Tube QHS   feeding supplement  237 mL Oral TID BM   feeding supplement (PROSource TF20)  60 mL Per Tube TID   fiber supplement (BANATROL TF)  60 mL Per Tube BID   Gerhardt's butt cream   Topical BID  insulin  aspart  0-9 Units Subcutaneous Q4H   insulin  glargine  15 Units Subcutaneous Daily   mirtazapine   30 mg Per Tube QHS   mouth rinse  15 mL Mouth Rinse 4 times per day   pantoprazole   40 mg Oral Daily   QUEtiapine   25 mg Per Tube QHS   scopolamine   1 patch Transdermal Q72H   sodium chloride  flush  10-40 mL Intracatheter Q12H      [name], MD [facility] 02/23/2024, 8:49 AM   "

## 2024-02-23 NOTE — Progress Notes (Signed)
 PHARMACY - ANTICOAGULATION CONSULT NOTE  Pharmacy Consult for heparin  Indication: atrial fibrillation  Allergies[1]  Patient Measurements: Height: 5' 4 (162.6 cm) Weight: 78.4 kg (172 lb 13.5 oz) IBW/kg (Calculated) : 54.7 HEPARIN  DW (KG): 66.5  Vital Signs: Temp: 99 F (37.2 C) (12/20 1200) Temp Source: Bladder (12/20 0800) BP: 128/55 (12/20 1200) Pulse Rate: 60 (12/20 1200)  Labs: Recent Labs    02/21/24 1627 02/22/24 0452 02/22/24 1608 02/23/24 0455  HGB 10.3* 10.6*  --  9.7*  HCT 32.8* 33.3*  --  30.2*  PLT 96* 102*  --  111*  HEPARINUNFRC  --  0.25* 0.44 0.32  CREATININE 2.35* 1.79*  --  2.64*    Estimated Creatinine Clearance: 20.7 mL/min (A) (by C-G formula based on SCr of 2.64 mg/dL (H)).   Medical History: Past Medical History:  Diagnosis Date   Medical history non-contributory    Thyroid  disease    Vertigo     Medications:  Scheduled:   amiodarone   400 mg Per Tube BID   clonazePAM   0.5 mg Per Tube QHS   feeding supplement  237 mL Oral TID BM   feeding supplement (PROSource TF20)  60 mL Per Tube TID   fiber supplement (BANATROL TF)  60 mL Per Tube BID   Gerhardt's butt cream   Topical BID   insulin  aspart  0-9 Units Subcutaneous Q4H   insulin  glargine  15 Units Subcutaneous Daily   mirtazapine   30 mg Per Tube QHS   mouth rinse  15 mL Mouth Rinse 4 times per day   pantoprazole   40 mg Oral Daily   QUEtiapine   25 mg Per Tube QHS   scopolamine   1 patch Transdermal Q72H   sertraline   25 mg Per Tube QHS   sodium chloride  flush  10-40 mL Intracatheter Q12H    Assessment: Tracy Baker is a 38 YOF now s/p MV repair and redo on 12/5. Hospitalization complicated by acute RV failure with cardiogenic shock s/p Protek duo VV ECMO cannula. Patient also with atrial fibrillation throughout this hospitalization.  Discussed with CCM/HF team and decision made to start low-dose heparin  infusion 12/14. Pharmacy consulted for heparin  dosing.   Heparin  level  therapeutic at 0.32, CBC ok.  Goal of Therapy:  Heparin  level 0.3-0.5 Monitor platelets by anticoagulation protocol: Yes   Plan:  Continue heparin  infusion at 1100 units/hr  Daily heparin  level, CBC  Tracy Baker, PharmD, BCPS, [facility] Clinical Pharmacist [phone] Please check AMION for all [facility] Pharmacy numbers 02/23/2024        [1]  Allergies Allergen Reactions   Codeine Anaphylaxis and Nausea And Vomiting   Bactoshield Chg [Chlorhexidine  Gluconate] Dermatitis   Chlorhexidine  Dermatitis and Rash   Red Dye #40 (Allura Red) Nausea Only

## 2024-02-23 NOTE — Progress Notes (Signed)
 Pt dialyzed in unit using L internal jugular.  Pt access had no issues. Pt completed tx with no issues. Pt vitals were stable during tx,  02/23/24 1848  Vitals  Temp 98.2 F (36.8 C)  Pulse Rate 92  Resp 18  BP (!) 118/102  SpO2 97 %  O2 Device Room Air  Oxygen Therapy  Patient Activity (if Appropriate) In bed  Pulse Oximetry Type Continuous  Oximetry Probe Site Changed No  Post Treatment  Dialyzer Clearance Clear  Hemodialysis Intake (mL) 0 mL  Liters Processed 72  Fluid Removed (mL) 3000 mL  Tolerated HD Treatment Yes

## 2024-02-24 DIAGNOSIS — B377 Candidal sepsis: Secondary | ICD-10-CM | POA: Diagnosis not present

## 2024-02-24 DIAGNOSIS — I4819 Other persistent atrial fibrillation: Secondary | ICD-10-CM | POA: Diagnosis not present

## 2024-02-24 LAB — GLUCOSE, CAPILLARY
Glucose-Capillary: 123 mg/dL — ABNORMAL HIGH (ref 70–99)
Glucose-Capillary: 139 mg/dL — ABNORMAL HIGH (ref 70–99)
Glucose-Capillary: 150 mg/dL — ABNORMAL HIGH (ref 70–99)
Glucose-Capillary: 166 mg/dL — ABNORMAL HIGH (ref 70–99)
Glucose-Capillary: 191 mg/dL — ABNORMAL HIGH (ref 70–99)
Glucose-Capillary: 99 mg/dL (ref 70–99)

## 2024-02-24 LAB — COOXEMETRY PANEL
Carboxyhemoglobin: 1.6 % — ABNORMAL HIGH (ref 0.5–1.5)
Methemoglobin: <0.7 % (ref 0.0–1.5)
O2 Saturation: 55.3 %
Total hemoglobin: 10.7 g/dL — ABNORMAL LOW (ref 12.0–16.0)

## 2024-02-24 LAB — CBC
HCT: 32.7 % — ABNORMAL LOW (ref 36.0–46.0)
Hemoglobin: 10.4 g/dL — ABNORMAL LOW (ref 12.0–15.0)
MCH: 31.3 pg (ref 26.0–34.0)
MCHC: 31.8 g/dL (ref 30.0–36.0)
MCV: 98.5 fL (ref 80.0–100.0)
Platelets: 146 K/uL — ABNORMAL LOW (ref 150–400)
RBC: 3.32 MIL/uL — ABNORMAL LOW (ref 3.87–5.11)
RDW: 20.2 % — ABNORMAL HIGH (ref 11.5–15.5)
WBC: 7 K/uL (ref 4.0–10.5)
nRBC: 0 % (ref 0.0–0.2)

## 2024-02-24 LAB — RENAL FUNCTION PANEL
Albumin: 3.5 g/dL (ref 3.5–5.0)
Anion gap: 15 (ref 5–15)
BUN: 61 mg/dL — ABNORMAL HIGH (ref 8–23)
CO2: 24 mmol/L (ref 22–32)
Calcium: 8.9 mg/dL (ref 8.9–10.3)
Chloride: 94 mmol/L — ABNORMAL LOW (ref 98–111)
Creatinine, Ser: 2.19 mg/dL — ABNORMAL HIGH (ref 0.44–1.00)
GFR, Estimated: 24 mL/min — ABNORMAL LOW
Glucose, Bld: 195 mg/dL — ABNORMAL HIGH (ref 70–99)
Phosphorus: 3.8 mg/dL (ref 2.5–4.6)
Potassium: 4.7 mmol/L (ref 3.5–5.1)
Sodium: 133 mmol/L — ABNORMAL LOW (ref 135–145)

## 2024-02-24 LAB — HEPATIC FUNCTION PANEL
ALT: 18 U/L (ref 0–44)
AST: 26 U/L (ref 15–41)
Albumin: 3.5 g/dL (ref 3.5–5.0)
Alkaline Phosphatase: 267 U/L — ABNORMAL HIGH (ref 38–126)
Bilirubin, Direct: 0.6 mg/dL — ABNORMAL HIGH (ref 0.0–0.2)
Indirect Bilirubin: 0.5 mg/dL (ref 0.3–0.9)
Total Bilirubin: 1.1 mg/dL (ref 0.0–1.2)
Total Protein: 6.1 g/dL — ABNORMAL LOW (ref 6.5–8.1)

## 2024-02-24 LAB — MAGNESIUM: Magnesium: 2.3 mg/dL (ref 1.7–2.4)

## 2024-02-24 LAB — HEPARIN LEVEL (UNFRACTIONATED): Heparin Unfractionated: 0.29 [IU]/mL — ABNORMAL LOW (ref 0.30–0.70)

## 2024-02-24 NOTE — Progress Notes (Signed)
 PHARMACY - ANTICOAGULATION CONSULT NOTE  Pharmacy Consult for heparin  Indication: atrial fibrillation  Allergies[1]  Patient Measurements: Height: 5' 4 (162.6 cm) Weight: 73.9 kg (162 lb 14.7 oz) IBW/kg (Calculated) : 54.7 HEPARIN  DW (KG): 66.5  Vital Signs: Temp: 99.3 F (37.4 C) (12/21 0700) BP: 120/54 (12/21 0700) Pulse Rate: 55 (12/21 0700)  Labs: Recent Labs    02/21/24 1627 02/21/24 1627 02/22/24 0452 02/22/24 1608 02/23/24 0455 02/24/24 0514  HGB 10.3*  --  10.6*  --  9.7* 10.4*  HCT 32.8*  --  33.3*  --  30.2* 32.7*  PLT 96*  --  102*  --  111* 146*  HEPARINUNFRC  --    < > 0.25* 0.44 0.32 0.29*  CREATININE 2.35*  --  1.79*  --  2.64*  --    < > = values in this interval not displayed.    Estimated Creatinine Clearance: 20.1 mL/min (A) (by C-G formula based on SCr of 2.64 mg/dL (H)).   Medical History: Past Medical History:  Diagnosis Date   Medical history non-contributory    Thyroid  disease    Vertigo     Medications:  Scheduled:   amiodarone   400 mg Per Tube BID   clonazePAM   0.5 mg Per Tube QHS   feeding supplement  237 mL Oral TID BM   feeding supplement (PROSource TF20)  60 mL Per Tube TID   fiber supplement (BANATROL TF)  60 mL Per Tube BID   Gerhardt's butt cream   Topical BID   insulin  aspart  0-9 Units Subcutaneous Q4H   insulin  glargine  15 Units Subcutaneous Daily   mirtazapine   30 mg Per Tube QHS   mouth rinse  15 mL Mouth Rinse 4 times per day   pantoprazole   40 mg Oral Daily   QUEtiapine   25 mg Per Tube QHS   scopolamine   1 patch Transdermal Q72H   sertraline   25 mg Per Tube QHS   sodium chloride  flush  10-40 mL Intracatheter Q12H    Assessment: Tracy Baker is a 23 YOF now s/p MV repair and redo on 12/5. Hospitalization complicated by acute RV failure with cardiogenic shock s/p Protek duo VV ECMO cannula. Patient also with atrial fibrillation throughout this hospitalization.  Discussed with CCM/HF team and decision made to  start low-dose heparin  infusion 12/14. Pharmacy consulted for heparin  dosing.   Heparin  level slightly subtherapeutic at 0.29, CBC ok. No bleeding issues.  Goal of Therapy:  Heparin  level 0.3-0.5 Monitor platelets by anticoagulation protocol: Yes   Plan:  Increase heparin  infusion to 1150 units/hr  Daily heparin  level, CBC  [name], PharmD, BCPS, [facility] Clinical Pharmacist [phone] Please check AMION for all [facility] Pharmacy numbers 02/24/2024         [1]  Allergies Allergen Reactions   Codeine Anaphylaxis and Nausea And Vomiting   Bactoshield Chg [Chlorhexidine  Gluconate] Dermatitis   Chlorhexidine  Dermatitis and Rash   Red Dye #40 (Allura Red) Nausea Only

## 2024-02-24 NOTE — Progress Notes (Signed)
 Patient ID: Tracy Baker, female   DOB: [birth year], 68 y.o.   MRN: [id number]     Advanced Heart Failure Rounding Note  Cardiologist: Lonni Cash, MD  HF Cardiologist: Dr. Zenaida  Chief Complaint: S/p MV Repair / MV  endocarditis   Patient Profile  Tracy Baker is a 68 y.o. female with hx of recent nephrolithiasis s/p ureteral stent and lithotripsy at [facility] 10/10 with stent removal. 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 Post-op course c/b post cardiotomy shock, junctional bradycardia, afib w/ RVR and HAP.   Significant events:   12/2 RHC (on 0.125 of Milrinone ): RA 4, PA 42/13 (22), PCW 12 with Vwaves to 20, CO/CI TD 3.37/2.03, PAPi 7.5 12/5 S/P Redo MVR - Intra Op given  4UPRBCs, 2UPLTs, 2FFP, 1Cyro 12/6 Protek Duo cannulation 12/8 Decannulated.  Post-op echo showed EF 50%, RV mildly dilated and mildly dysfunctional, s/p bioprosthetic mitral valve (stable). 12/13 CVVH begun 12/15: extubated 12/16: CRRT stopped.  12/17: Started DBA wean 12/18: Failed lasix  challenge. iHD   Subjective:    Remains on DBA 2.  Tolerated HD yesterday. Weight down 6 pounds  Rhythm stable. Co-ox 55%   Objective:    Weight Range: 73.9 kg Body mass index is 27.97 kg/m.   Vital Signs:   Temp:  [83.5 F (28.6 C)-99.9 F (37.7 C)] 98.6 F (37 C) (12/21 1100) Pulse Rate:  [53-106] 66 (12/21 1100) Resp:  [6-26] 14 (12/21 1100) BP: (82-138)/(50-102) 127/63 (12/21 1100) SpO2:  [92 %-100 %] 97 % (12/21 1100) Weight:  [73.9 kg-79.5 kg] 73.9 kg (12/21 0500) Last BM Date : 02/23/24  Weight change: Filed Weights   02/23/24 1530 02/23/24 1848 02/24/24 0500  Weight: 79.5 kg 76.5 kg 73.9 kg   Intake/Output:  Intake/Output Summary (Last 24 hours) at 02/24/2024 1207 Last data filed at 02/24/2024 1100 Gross per 24 hour  Intake 1298.79 ml  Output 3090 ml  Net -1791.21 ml   Physical Exam   General:  Sitting up in bed. No resp difficulty HEENT:  normal Neck: supple. + HD cath  JVP up   Cor: Irregular rate & rhythm. No rubs, gallops or murmurs. Lungs: clear Abdomen: soft, nontender, nondistended.Good bowel sounds. Extremities: no cyanosis, clubbing, rash, 1-2+ edema + UNNA Neuro: alert & orientedx3, cranial nerves grossly intact. moves all 4 extremities w/o difficulty. Affect pleasant    Telemetry   Afib 60-80s Personally reviewed    Labs    CBC Recent Labs    02/23/24 0455 02/24/24 0514  WBC 6.4 7.0  HGB 9.7* 10.4*  HCT 30.2* 32.7*  MCV 98.4 98.5  PLT 111* 146*   Basic Metabolic Panel Recent Labs    87/79/74 0455 02/24/24 0514  NA 133* 133*  K 4.5 4.7  CL 95* 94*  CO2 24 24  GLUCOSE 174* 195*  BUN 81* 61*  CREATININE 2.64* 2.19*  CALCIUM  9.0 8.9  MG 2.4 2.3  PHOS 5.1* 3.8   Liver Function Tests Recent Labs    02/23/24 0455 02/24/24 0514  AST 21 26  ALT 16 18  ALKPHOS 263* 267*  BILITOT 1.0 1.1  PROT 5.8* 6.1*  ALBUMIN  3.4*  3.4* 3.5  3.5   Medications:    Scheduled Medications:  amiodarone   400 mg Per Tube BID   clonazePAM   0.5 mg Per Tube QHS   feeding supplement  237 mL Oral TID BM   feeding supplement (PROSource TF20)  60 mL Per Tube TID   fiber supplement PEGGI  TF)  60 mL Per Tube BID   Gerhardt's butt cream   Topical BID   insulin  aspart  0-9 Units Subcutaneous Q4H   insulin  glargine  15 Units Subcutaneous Daily   mirtazapine   30 mg Per Tube QHS   mouth rinse  15 mL Mouth Rinse 4 times per day   pantoprazole   40 mg Oral Daily   QUEtiapine   25 mg Per Tube QHS   scopolamine   1 patch Transdermal Q72H   sertraline   25 mg Per Tube QHS   sodium chloride  flush  10-40 mL Intracatheter Q12H    Infusions:  anticoagulant sodium citrate      DOBUTamine  2 mcg/kg/min (02/24/24 1100)   feeding supplement (VITAL 1.5 CAL) Stopped (02/24/24 0630)   fluconazole  (DIFLUCAN ) IV 100 mL/hr at 02/24/24 1100   heparin  1,150 Units/hr (02/24/24 1100)   promethazine  (PHENERGAN ) injection (IM or  IVPB) Stopped (02/19/24 2145)    PRN Medications: albuterol , alteplase , anticoagulant sodium citrate , heparin , lidocaine  (PF), lidocaine -prilocaine , mouth rinse, pentafluoroprop-tetrafluoroeth, polyethylene glycol, promethazine  (PHENERGAN ) injection (IM or IVPB), simethicone , sodium chloride  flush  Assessment/Plan   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 Kerrin for Candida endocarditis. Pre-op L/RHC 01/04/24 with no CAD and normal L/R heart pressures.  - Failed initial Milrinone  wean. Eventually weaned off, unfortunately restarted 12/1 for low output.  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 - Echo 12/19 EF 60% RV mildly HK  - Co-ox 55% on DBA 2. Would continue until fluid off with iHD - CVVH stopped 12/16. Failed lasix  challenge with rising BUN and Scr. - Volume status improving with iHD but still 20-30 # above pre-op weight. Plan tunneled HD cath tomorrow followed by iHD.  Acute hypoxic respiratory failure: Suspect component of infection/aspiration PNA given unilateral disease.  - Respiratory culture- Enterobacter, On fluconazole . Completed cefepime  and vanc. - Extubated 12/15.  - stable on RA  Coagulopathy/thrombocytopenia/ABLA anemia:  - Slowly improving, up to 146K today.  Suspect this is inflammatory due to critical illness.  - Continue heparin   Sick sinus syndrome - Initially with asystole underneath (as of 12/10) - Now with atrial fibrillation and intermittent junctional rhythm  Afib, persistent - Continue heparin  gtt, eventually switch to po anticoagulation when no more  procedures - Has received adequate amiodarone  load. Continue po amiodarone . No cardioversion for now with concerns for bradycardia.  - AF rate controlled  AKI on CKD 3b - Stopped CVVH 12/16. Failed lasix  challenge. iHD 12/18. Tolerated well. - plan as above  Protein-calorie malnutrition - On TF - SLP following - Not taking in much po. Increase remeron  to stimulate appetite  Pressure Ulcer R Buttock, DTI - Q2 turns, offload  Left radial artery occlusion - Left ulnar artery is patent.  - Continue heparin  gtt  Extremely weak. Needs ongoing aggressive PT/OT. Needs more progress with therapy for CIR admission.  Toribio Fuel MD 02/24/2024 12:07 PM

## 2024-02-24 NOTE — Progress Notes (Signed)
 TCTS PM Rounding Progress Note  Had a good day today Sat in a chair, ate some fruit and ensure  Vitals:   02/24/24 1600 02/24/24 1700  BP: 126/64 129/66  Pulse: 64 67  Resp: 19 15  Temp: 99 F (37.2 C) 99 F (37.2 C)  SpO2: 98% 99%    Plan: - Continue current care  Con Clunes, MD Cardiothoracic Surgery Pager: [phone]

## 2024-02-24 NOTE — Progress Notes (Signed)
" °  Subjective: No issues overnight, remains on Dobutamine  at 2,Co-ox stable at 55 Tolerated iHD yesterday, plan for tunneled line tomorrow  Objective: Vital signs in last 24 hours: BP (!) 118/50   Pulse 62   Temp 99 F (37.2 C)   Resp 16   Ht 5' 4 (1.626 m)   Wt 73.9 kg   SpO2 98%   BMI 27.97 kg/m  Filed Weights   02/23/24 1530 02/23/24 1848 02/24/24 0500  Weight: 79.5 kg 76.5 kg 73.9 kg    Hemodynamic parameters for last 24 hours: CVP:  [6 mmHg-25 mmHg] 25 mmHg  Intake/Output from previous day: 12/20 0701 - 12/21 0700 In: 1267.2 [I.V.:310.2; NG/GT:757; IV Piggyback:200] Out: 3090 [Urine:90] Intake/Output this shift: Total I/O In: 12.9 [I.V.:12.9] Out: -   Physical Exam: General - Resting comfortably in bed CV - Afib Resp - Unlabored on room air Abd - Soft, ND/NT Ext - Edema improving  Lab Results:    Latest Ref Rng & Units 02/24/2024    5:14 AM 02/23/2024    4:55 AM 02/22/2024    4:52 AM  CBC  WBC 4.0 - 10.5 K/uL 7.0  6.4  7.6   Hemoglobin 12.0 - 15.0 g/dL 89.5  9.7  89.3   Hematocrit 36.0 - 46.0 % 32.7  30.2  33.3   Platelets 150 - 400 K/uL 146  111  102       Latest Ref Rng & Units 02/24/2024    5:14 AM 02/23/2024    4:55 AM 02/22/2024    4:52 AM  CMP  Glucose 70 - 99 mg/dL 804  825  875   BUN 8 - 23 mg/dL 61  81  47   Creatinine 0.44 - 1.00 mg/dL 7.80  7.35  8.20   Sodium 135 - 145 mmol/L 133  133  134   Potassium 3.5 - 5.1 mmol/L 4.7  4.5  4.0   Chloride 98 - 111 mmol/L 94  95  95   CO2 22 - 32 mmol/L 24  24  27    Calcium  8.9 - 10.3 mg/dL 8.9  9.0  9.1   Total Protein 6.5 - 8.1 g/dL 6.1  5.8  5.8   Total Bilirubin 0.0 - 1.2 mg/dL 1.1  1.0  1.1   Alkaline Phos 38 - 126 U/L 267  263  270   AST 15 - 41 U/L 26  21  25    ALT 0 - 44 U/L 18  16  19      CXR: No new CXR  Assessment/Plan: S/P Procedures (LRB): MVR on 12/5, protek placed on 12/6 and decannulated on 12/8 Continue dobutamine  per heart failure iHD Monday per renal IR tomorrow  for tunneled line Continue aggressive PT PO intake as tolerated, continue cycled tube feeds   LOS: 53 days    Con RAMAN Chrissy Ealey 02/24/2024  "

## 2024-02-24 NOTE — Progress Notes (Signed)
 " Tracy Baker Progress Note    Assessment/ Plan:   AKI -b/l Cr 0.4-0.8. 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 CRRT started 12/13 to help offload volume and given encephalopathy/uremia. Was unable to wean from ventilator. Now extubated 12/15. Stopped CRRT 12/16. Failed lasix  challenge 12/17. Failed again on 12/18 with higher dose of lasix  and metolazone . s/p HD 12/18 -plan for HD tomorrow. Will have her on a TTS schedule moving forward. Holiday schedule this week -TDC planned for tomorrow -will cont to monitor for any recovery -Avoid nephrotoxic medications including NSAIDs and iodinated intravenous contrast exposure unless the latter is absolutely indicated.  Preferred narcotic agents for pain control are hydromorphone , fentanyl , and methadone . Morphine should not be used. Avoid Baclofen and avoid oral sodium phosphate  and magnesium  citrate based laxatives / bowel preps. Continue strict Input and Output monitoring. 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 On fluconazole  -on dobuatmine--being weaned  AHRF -extubated on 12/15 -currently stable -UF as tolerated with HD  Anemia -transfuse PRN for Hgb <7 -avoid IV Fe -Hgb 10.4  Thombocytopenia -w/u per primary service  Discussed with CCM, primary service, and ICU RN.  FYI -- Holiday Schedule for next week Usual TTS schedule -> Mon (22), Wed (24), Sat (27)  Subjective:   Patient seen and examined in room. No acute events. Denies any CP/SOB. Very sleepy   Objective:   BP (!) 120/54   Pulse (!) 55   Temp 99.3 F (37.4 C)   Resp 14   Ht 5' 4 (1.626 m)   Wt 73.9 kg   SpO2 99%   BMI 27.97 kg/m   Intake/Output Summary (Last 24 hours) at 02/24/2024 0748 Last data filed at 02/24/2024 0700 Gross per 24 hour   Intake 1267.15 ml  Output 3090 ml  Net -1822.85 ml   Weight change: 1.1 kg  Physical Exam: Gen: ill/tired appearing, NAD CVS: RRR Resp: cta bl, unlabored, normal wob Abd: soft Ext: wrapped LE's with some dependent edema Neuro: awake, alert, drowsy Dialysis access: LIJ temp HD cath  Imaging: ECHOCARDIOGRAM LIMITED Result Date: 02/22/2024    ECHOCARDIOGRAM LIMITED REPORT   Patient Name:   Tracy Baker Date of Exam: 02/22/2024 Medical Rec #:  [id number]      Height:       64.0 in Accession #:    [id number]     Weight:       173.7 lb Date of Birth:  February 29, 1956      BSA:          1.843 m Patient Age:    68 years       BP:           106/64 mmHg Patient Gender: F              HR:           77 bpm. Exam Location:  Inpatient Procedure: Cardiac Doppler, Color Doppler, Intracardiac Opacification Agent and            Limited Echo (Both Spectral and Color Flow Doppler were utilized            during procedure). Indications:    CHF-acute systolic 150.21  History:        Patient has prior history of Echocardiogram examinations, most  recent 02/11/2024. Risk Factors:Current Smoker.  Sonographer:    Merlynn Argyle Referring Phys: [phone] [facility] [address]  Sonographer Comments: No subcostal window and Technically difficult study due to poor echo windows. Image acquisition challenging due to respiratory motion. IMPRESSIONS  1. Technically difficult study with limited views  2. Left ventricular ejection fraction, by estimation, is 60 to 65%. The left ventricle has normal function. Apical septal hypokinesis  3. Right ventricular systolic function was not well visualized. FINDINGS  Left Ventricle: Left ventricular ejection fraction, by estimation, is 60 to 65%. The left ventricle has normal function. Definity  contrast agent was given IV to delineate the left ventricular endocardial borders. Right Ventricle: Right ventricular systolic function was not well visualized. Mitral Valve: There is a bioprosthetic  valve present in the mitral position. MV peak gradient, 7.3 mmHg. The mean mitral valve gradient is 3.0 mmHg. MITRAL VALVE MV Peak grad: 7.3 mmHg MV Mean grad: 3.0 mmHg MV Vmax:      1.35 m/s MV Vmean:     86.2 cm/s Lonni Nanas MD Electronically signed by Lonni Nanas MD Signature Date/Time: 02/22/2024/1:21:31 PM    Final      Labs: BMET Recent Labs  Lab 02/19/24 0500 02/19/24 1654 02/20/24 0400 02/21/24 0435 02/21/24 1627 02/22/24 0452 02/23/24 0455  NA 139 138 138 136 134* 134* 133*  K 4.4 4.8 4.9 4.8 4.9 4.0 4.5  CL 102 102 103 98 97* 95* 95*  CO2 27 26 25 25 23 27 24   GLUCOSE 149* 100* 108* 201* 137* 124* 174*  BUN 24* 40* 51* 65* 75* 47* 81*  CREATININE 0.83 1.25* 1.58* 2.02* 2.35* 1.79* 2.64*  CALCIUM  8.7* 8.9 8.9 8.7* 9.1 9.1 9.0  PHOS 3.8 3.4 4.0 4.8* 5.1* 4.0 5.1*   CBC Recent Labs  Lab 02/21/24 1627 02/22/24 0452 02/23/24 0455 02/24/24 0514  WBC 8.6 7.6 6.4 7.0  HGB 10.3* 10.6* 9.7* 10.4*  HCT 32.8* 33.3* 30.2* 32.7*  MCV 99.1 99.1 98.4 98.5  PLT 96* 102* 111* 146*    Medications:     amiodarone   400 mg Per Tube BID   clonazePAM   0.5 mg Per Tube QHS   feeding supplement  237 mL Oral TID BM   feeding supplement (PROSource TF20)  60 mL Per Tube TID   fiber supplement (BANATROL TF)  60 mL Per Tube BID   Gerhardt's butt cream   Topical BID   insulin  aspart  0-9 Units Subcutaneous Q4H   insulin  glargine  15 Units Subcutaneous Daily   mirtazapine   30 mg Per Tube QHS   mouth rinse  15 mL Mouth Rinse 4 times per day   pantoprazole   40 mg Oral Daily   QUEtiapine   25 mg Per Tube QHS   scopolamine   1 patch Transdermal Q72H   sertraline   25 mg Per Tube QHS   sodium chloride  flush  10-40 mL Intracatheter Q12H      [name], MD [facility] 02/24/2024, 7:48 AM   "

## 2024-02-24 NOTE — Progress Notes (Signed)
 02/24/2024 No events. Continue mobility efforts TDC and HD again tomorrow  Rolan Sharps MD PCCM

## 2024-02-25 ENCOUNTER — Inpatient Hospital Stay (HOSPITAL_COMMUNITY)

## 2024-02-25 DIAGNOSIS — N179 Acute kidney failure, unspecified: Secondary | ICD-10-CM | POA: Diagnosis not present

## 2024-02-25 DIAGNOSIS — N1831 Chronic kidney disease, stage 3a: Secondary | ICD-10-CM | POA: Diagnosis not present

## 2024-02-25 DIAGNOSIS — B377 Candidal sepsis: Secondary | ICD-10-CM | POA: Diagnosis not present

## 2024-02-25 DIAGNOSIS — R570 Cardiogenic shock: Secondary | ICD-10-CM | POA: Diagnosis not present

## 2024-02-25 DIAGNOSIS — E43 Unspecified severe protein-calorie malnutrition: Secondary | ICD-10-CM | POA: Diagnosis not present

## 2024-02-25 HISTORY — PX: IR TUNNELED CENTRAL VENOUS CATH PLC W IMG: IMG1939

## 2024-02-25 LAB — HEPATIC FUNCTION PANEL
ALT: 17 U/L (ref 0–44)
AST: 19 U/L (ref 15–41)
Albumin: 3.4 g/dL — ABNORMAL LOW (ref 3.5–5.0)
Alkaline Phosphatase: 260 U/L — ABNORMAL HIGH (ref 38–126)
Bilirubin, Direct: 0.6 mg/dL — ABNORMAL HIGH (ref 0.0–0.2)
Indirect Bilirubin: 0.4 mg/dL (ref 0.3–0.9)
Total Bilirubin: 1 mg/dL (ref 0.0–1.2)
Total Protein: 6 g/dL — ABNORMAL LOW (ref 6.5–8.1)

## 2024-02-25 LAB — COOXEMETRY PANEL
Carboxyhemoglobin: 2.1 % — ABNORMAL HIGH (ref 0.5–1.5)
Methemoglobin: <0.7 % (ref 0.0–1.5)
O2 Saturation: 58.4 %
Total hemoglobin: 10.2 g/dL — ABNORMAL LOW (ref 12.0–16.0)

## 2024-02-25 LAB — RENAL FUNCTION PANEL
Albumin: 3.4 g/dL — ABNORMAL LOW (ref 3.5–5.0)
Anion gap: 15 (ref 5–15)
BUN: 91 mg/dL — ABNORMAL HIGH (ref 8–23)
CO2: 24 mmol/L (ref 22–32)
Calcium: 9 mg/dL (ref 8.9–10.3)
Chloride: 94 mmol/L — ABNORMAL LOW (ref 98–111)
Creatinine, Ser: 3.03 mg/dL — ABNORMAL HIGH (ref 0.44–1.00)
GFR, Estimated: 16 mL/min — ABNORMAL LOW
Glucose, Bld: 194 mg/dL — ABNORMAL HIGH (ref 70–99)
Phosphorus: 4.5 mg/dL (ref 2.5–4.6)
Potassium: 5.1 mmol/L (ref 3.5–5.1)
Sodium: 132 mmol/L — ABNORMAL LOW (ref 135–145)

## 2024-02-25 LAB — CBC
HCT: 31.1 % — ABNORMAL LOW (ref 36.0–46.0)
Hemoglobin: 9.9 g/dL — ABNORMAL LOW (ref 12.0–15.0)
MCH: 31.8 pg (ref 26.0–34.0)
MCHC: 31.8 g/dL (ref 30.0–36.0)
MCV: 100 fL (ref 80.0–100.0)
Platelets: 137 K/uL — ABNORMAL LOW (ref 150–400)
RBC: 3.11 MIL/uL — ABNORMAL LOW (ref 3.87–5.11)
RDW: 20 % — ABNORMAL HIGH (ref 11.5–15.5)
WBC: 5.8 K/uL (ref 4.0–10.5)
nRBC: 0 % (ref 0.0–0.2)

## 2024-02-25 LAB — GLUCOSE, CAPILLARY
Glucose-Capillary: 187 mg/dL — ABNORMAL HIGH (ref 70–99)
Glucose-Capillary: 187 mg/dL — ABNORMAL HIGH (ref 70–99)
Glucose-Capillary: 89 mg/dL (ref 70–99)
Glucose-Capillary: 133 mg/dL — ABNORMAL HIGH (ref 70–99)
Glucose-Capillary: 155 mg/dL — ABNORMAL HIGH (ref 70–99)
Glucose-Capillary: 143 mg/dL — ABNORMAL HIGH (ref 70–99)

## 2024-02-25 LAB — HEPARIN LEVEL (UNFRACTIONATED): Heparin Unfractionated: 0.4 [IU]/mL (ref 0.30–0.70)

## 2024-02-25 LAB — MAGNESIUM: Magnesium: 2.4 mg/dL (ref 1.7–2.4)

## 2024-02-25 MED ORDER — FENTANYL CITRATE (PF) 100 MCG/2ML IJ SOLN
INTRAMUSCULAR | Status: AC | PRN
  Administered 2024-02-25: 25 ug via INTRAVENOUS

## 2024-02-25 MED ORDER — LIDOCAINE-EPINEPHRINE 1 %-1:100000 IJ SOLN
INTRAMUSCULAR | Status: AC
  Filled 2024-02-25: qty 1

## 2024-02-25 MED ORDER — MIDAZOLAM HCL (PF) 2 MG/2ML IJ SOLN
INTRAMUSCULAR | Status: AC | PRN
  Administered 2024-02-25: .5 mg via INTRAVENOUS
  Administered 2024-02-25: 1 mg via INTRAVENOUS

## 2024-02-25 MED ORDER — HEPARIN SODIUM (PORCINE) 1000 UNIT/ML IJ SOLN
INTRAMUSCULAR | Status: AC
  Filled 2024-02-25: qty 10

## 2024-02-25 MED ORDER — MIDAZOLAM HCL 2 MG/2ML IJ SOLN
INTRAMUSCULAR | Status: AC
  Filled 2024-02-25: qty 2

## 2024-02-25 MED ORDER — CEFAZOLIN SODIUM-DEXTROSE 2-4 GM/100ML-% IV SOLN
INTRAVENOUS | Status: AC
  Filled 2024-02-25: qty 100

## 2024-02-25 MED ORDER — VITAL 1.5 CAL PO LIQD
1000.0000 mL | ORAL | Status: DC
  Administered 2024-02-25 – 2024-02-28 (×2): 1000 mL
  Filled 2024-02-25 (×4): qty 1000

## 2024-02-25 MED ORDER — CEFAZOLIN SODIUM-DEXTROSE 2-4 GM/100ML-% IV SOLN
INTRAVENOUS | Status: AC | PRN
  Administered 2024-02-25: 2 g via INTRAVENOUS

## 2024-02-25 MED ORDER — FENTANYL CITRATE (PF) 100 MCG/2ML IJ SOLN
INTRAMUSCULAR | Status: AC
  Filled 2024-02-25: qty 2

## 2024-02-25 MED ORDER — PROSOURCE TF20 ENFIT COMPATIBL EN LIQD
60.0000 mL | Freq: Every day | ENTERAL | Status: DC
  Administered 2024-02-26 – 2024-03-03 (×6): 60 mL
  Filled 2024-02-25 (×6): qty 60

## 2024-02-25 MED ORDER — HEPARIN (PORCINE) 25000 UT/250ML-% IV SOLN
1150.0000 [IU]/h | INTRAVENOUS | Status: DC
  Administered 2024-02-26 – 2024-03-01 (×5): 1150 [IU]/h via INTRAVENOUS
  Administered 2024-03-02: 1000 [IU]/h via INTRAVENOUS
  Administered 2024-03-02: 1150 [IU]/h via INTRAVENOUS
  Filled 2024-02-25 (×7): qty 250

## 2024-02-25 MED ORDER — LIDOCAINE-EPINEPHRINE 1 %-1:100000 IJ SOLN
20.0000 mL | Freq: Once | INTRAMUSCULAR | Status: AC
  Administered 2024-02-25: 12 mL via INTRADERMAL

## 2024-02-25 MED ORDER — HEPARIN SODIUM (PORCINE) 1000 UNIT/ML IJ SOLN
INTRAMUSCULAR | Status: AC
  Filled 2024-02-25: qty 12

## 2024-02-25 MED ORDER — HEPARIN SODIUM (PORCINE) 1000 UNIT/ML IJ SOLN
10000.0000 [IU] | Freq: Once | INTRAMUSCULAR | Status: AC
  Administered 2024-02-25: 4100 [IU]
  Filled 2024-02-25: qty 10

## 2024-02-25 NOTE — Progress Notes (Signed)
" °  Subjective: No issues overnight, remains on Dobutamine  at 2,Co-ox stable at 58 Remains extremely weak but PO intake improving somewhat  Objective: Vital signs in last 24 hours: BP 117/65   Pulse 64   Temp (!) 96.8 F (36 C)   Resp 15   Ht 5' 4 (1.626 m)   Wt 74.1 kg   SpO2 98%   BMI 28.04 kg/m  Filed Weights   02/23/24 1848 02/24/24 0500 02/25/24 0500  Weight: 76.5 kg 73.9 kg 74.1 kg    Hemodynamic parameters for last 24 hours: CVP:  [17 mmHg-35 mmHg] 18 mmHg  Intake/Output from previous day: 12/21 0701 - 12/22 0700 In: 1727 [P.O.:240; I.V.:321; NG/GT:916; IV Piggyback:250] Out: 100 [Urine:100] Intake/Output this shift: No intake/output data recorded.  Physical Exam: General - Resting comfortably in bed CV - Afib Resp - Unlabored on room air Abd - Soft, ND/NT Ext - Edema improving  Lab Results:    Latest Ref Rng & Units 02/25/2024    5:27 AM 02/24/2024    5:14 AM 02/23/2024    4:55 AM  CBC  WBC 4.0 - 10.5 K/uL 5.8  7.0  6.4   Hemoglobin 12.0 - 15.0 g/dL 9.9  89.5  9.7   Hematocrit 36.0 - 46.0 % 31.1  32.7  30.2   Platelets 150 - 400 K/uL 137  146  111       Latest Ref Rng & Units 02/25/2024    5:27 AM 02/24/2024    5:14 AM 02/23/2024    4:55 AM  CMP  Glucose 70 - 99 mg/dL 805  804  825   BUN 8 - 23 mg/dL 91  61  81   Creatinine 0.44 - 1.00 mg/dL 6.96  7.80  7.35   Sodium 135 - 145 mmol/L 132  133  133   Potassium 3.5 - 5.1 mmol/L 5.1  4.7  4.5   Chloride 98 - 111 mmol/L 94  94  95   CO2 22 - 32 mmol/L 24  24  24    Calcium  8.9 - 10.3 mg/dL 9.0  8.9  9.0   Total Protein 6.5 - 8.1 g/dL 6.0  6.1  5.8   Total Bilirubin 0.0 - 1.2 mg/dL 1.0  1.1  1.0   Alkaline Phos 38 - 126 U/L 260  267  263   AST 15 - 41 U/L 19  26  21    ALT 0 - 44 U/L 17  18  16      CXR: No new CXR  Assessment/Plan: S/P Procedures (LRB): MVR on 12/5, protek placed on 12/6 and decannulated on 12/8 Wean dobutamine  per heart failure iHD today per renal IR today for tunneled  line, will pull wires after line with hep gtt on hold Continue aggressive PT PO intake as tolerated, continue cycled tube feeds   LOS: 54 days    Con RAMAN Bishoy Cupp 02/25/2024  "

## 2024-02-25 NOTE — Progress Notes (Signed)
 Occupational Therapy Treatment Patient Details Name: Tracy Baker MRN: [id number] DOB: Aug 22, 1955 Today's Date: 02/25/2024   History of present illness 68 y.o. female adm 12/29/2023 after leaving AMA from [facility] (where she was on vacation due to lack of formal transfer) where she was being treated for C. Albicans candidemia. 10/28 TEE with mitral valve vegetation. 11/3 teeth Extractions. 11/6 Mitral valve repair. 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 Extubated 12/15. PMHx: thyroid  disease.   OT comments  Pt making slow progress toward goals, fatigued from HD earlier today but agreeable to attempt OOB mobility. Pt currently needs min-max A for ADLs, max A +2 for bed mobility and mod +2 for standing x2 from EOB. Pt needs cues to stand upright as she frequently keeps trunk flexed in standing. Pt sits EOB x10 min for grooming and UB bathing task. Pt presenting with impairments listed below, will follow acutely. Patient will benefit from intensive inpatient follow-up therapy, >3 hours/day.       If plan is discharge home, recommend the following:  Two people to help with walking and/or transfers;Two people to help with bathing/dressing/bathroom;Assistance with cooking/housework;Assistance with feeding;Assist for transportation   Equipment Recommendations  Tub/shower seat    Recommendations for Other Services Rehab consult    Precautions / Restrictions Precautions Precautions: Fall;Sternal Precaution/Restrictions Comments: pacing wires, cortrak, foley       Mobility Bed Mobility Overal bed mobility: Needs Assistance Bed Mobility: Rolling, Sidelying to Sit, Sit to Sidelying Rolling: Max assist Sidelying to sit: Max assist, +2 for physical assistance, +2 for safety/equipment     Sit to sidelying: Max assist, +2 for physical assistance      Transfers Overall transfer level: Needs assistance Equipment used: 2  person hand held assist Transfers: Sit to/from Stand Sit to Stand: Mod assist, +2 physical assistance, Max assist                 Balance Overall balance assessment: Needs assistance Sitting-balance support: No upper extremity supported, Feet unsupported Sitting balance-Leahy Scale: Poor Sitting balance - Comments: min-modA trunk support to maintain static sitting, increased assist with faitgue Postural control: Posterior lean Standing balance support: Bilateral upper extremity supported, During functional activity Standing balance-Leahy Scale: Poor Standing balance comment: dependent on BUE support and external assist                           ADL either performed or assessed with clinical judgement   ADL Overall ADL's : Needs assistance/impaired Eating/Feeding: Minimal assistance;Sitting Eating/Feeding Details (indicate cue type and reason): ice chips Grooming: Minimal assistance;Wash/dry face;Sitting   Upper Body Bathing: Maximal assistance Upper Body Bathing Details (indicate cue type and reason): for back             Toilet Transfer: Maximal assistance;+2 for physical assistance           Functional mobility during ADLs: Maximal assistance;+2 for physical assistance      Extremity/Trunk Assessment Upper Extremity Assessment Upper Extremity Assessment: Generalized weakness   Lower Extremity Assessment Lower Extremity Assessment: Defer to PT evaluation        Vision   Vision Assessment?: No apparent visual deficits   Perception Perception Perception: Not tested   Praxis Praxis Praxis: Not tested   Communication Communication Communication: Impaired Factors Affecting Communication: Reduced clarity of speech;Difficulty expressing self   Cognition Arousal: Alert Behavior During Therapy: Flat affect  Following commands: Impaired Following commands impaired: Follows one step commands  inconsistently, Follows one step commands with increased time      Cueing   Cueing Techniques: Tactile cues, Gestural cues, Verbal cues  Exercises Exercises: Other exercises General Exercises - Upper Extremity Shoulder Flexion: AROM, Both, 10 reps Other Exercises Other Exercises: shoulder ER BUE x10 with elbows in    Shoulder Instructions       General Comments VSS on RA    Pertinent Vitals/ Pain       Pain Assessment Pain Assessment: No/denies pain  Home Living                                          Prior Functioning/Environment              Frequency  Min 2X/week        Progress Toward Goals  OT Goals(current goals can now be found in the care plan section)  Progress towards OT goals: Progressing toward goals     Plan      Co-evaluation    PT/OT/SLP Co-Evaluation/Treatment: Yes Reason for Co-Treatment: To address functional/ADL transfers;For patient/therapist safety;Complexity of the patient's impairments (multi-system involvement);Necessary to address cognition/behavior during functional activity PT goals addressed during session: Mobility/safety with mobility;Strengthening/ROM;Balance OT goals addressed during session: ADL's and self-care;Strengthening/ROM      AM-PAC OT 6 Clicks Daily Activity     Outcome Measure   Help from another person eating meals?: A Little Help from another person taking care of personal grooming?: A Little Help from another person toileting, which includes using toliet, bedpan, or urinal?: A Lot Help from another person bathing (including washing, rinsing, drying)?: A Lot Help from another person to put on and taking off regular upper body clothing?: A Lot Help from another person to put on and taking off regular lower body clothing?: A Lot 6 Click Score: 14    End of Session Equipment Utilized During Treatment: Gait belt  OT Visit Diagnosis: Unsteadiness on feet (R26.81);Muscle weakness  (generalized) (M62.81)   Activity Tolerance Patient tolerated treatment well   Patient Left in bed;with call bell/phone within reach;with bed alarm set (per RN request, pacing wires to be taken out soon)   Nurse Communication Mobility status        Time: 8540-8474 OT Time Calculation (min): 26 min  Charges: OT General Charges $OT Visit: 1 Visit OT Treatments $Self Care/Home Management : 8-22 mins  Willis Kuipers K, OTD, OTR/L SecureChat Preferred Acute Rehab [phone]   [name] 02/25/2024, 4:20 PM

## 2024-02-25 NOTE — Progress Notes (Signed)
 Physical Therapy Treatment Patient Details Name: Tracy Baker MRN: [id number] DOB: 1955-04-23 Today's Date: 02/25/2024   History of Present Illness 68 y.o. female adm 12/29/2023 after leaving AMA from [facility] (where she was on vacation due to lack of formal transfer) where she was being treated for C. Albicans candidemia. 10/28 TEE with mitral valve vegetation. 11/3 teeth Extractions. 11/6 Mitral valve repair. 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 Extubated 12/15. PMHx: thyroid  disease.    PT Comments  The pt was agreeable to session despite fatigue after HD this morning. The pt continues to require increased assist to complete bed mobility due to limited strength in LE to assist to rolling or moving LE to EOB. The pt needed min-modA to maintain static sitting EOB today due to progressive posterior lean with fatigue, and modA of 2 for standing with moments of maxA to maintain standing as pt fatigued. Will continue to benefit from skilled PT acutely to progress functional strength and mobility. Recommendations remain appropriate.    If plan is discharge home, recommend the following: Assistance with cooking/housework;Assist for transportation;Help with stairs or ramp for entrance;Two people to help with walking and/or transfers;Two people to help with bathing/dressing/bathroom;Assistance with feeding;Direct supervision/assist for medications management;Direct supervision/assist for financial management   Can travel by private vehicle        Equipment Recommendations  Other (comment) (TBD)    Recommendations for Other Services       Precautions / Restrictions Precautions Precautions: Fall;Sternal Recall of Precautions/Restrictions: Impaired Precaution/Restrictions Comments: temp pacer, cortrak, foley Restrictions Weight Bearing Restrictions Per Provider Order: No Other Position/Activity Restrictions: Cardiac sternal  precautions     Mobility  Bed Mobility Overal bed mobility: Needs Assistance Bed Mobility: Rolling, Sidelying to Sit, Sit to Sidelying Rolling: Max assist Sidelying to sit: Max assist, +2 for physical assistance, +2 for safety/equipment     Sit to sidelying: Max assist, +2 for physical assistance General bed mobility comments: verbal and tactile cues, pt helping initiate some LE movements, but needing assist to fully bend knees and move LE off EOB, maxA to trunk followed by modA to maintain static sitting.    Transfers Overall transfer level: Needs assistance Equipment used: 2 person hand held assist Transfers: Sit to/from Stand Sit to Stand: Mod assist, +2 physical assistance, Max assist           General transfer comment: modA to rise, maxA at times as pt fatigues to maintain standing. posterior lean with poor anterior wt shift in stand. unable to progress to lateralsteps    Ambulation/Gait         Gait velocity: decreased     General Gait Details: unable to initiate steps at this time, poor standing tolerance      Balance Overall balance assessment: Needs assistance Sitting-balance support: No upper extremity supported, Feet unsupported Sitting balance-Leahy Scale: Poor Sitting balance - Comments: min-modA trunk support to maintain static sitting, increased assist with faitgue Postural control: Posterior lean Standing balance support: Bilateral upper extremity supported, During functional activity Standing balance-Leahy Scale: Poor Standing balance comment: dependent on BUE support and external assist                            Communication Communication Communication: Impaired Factors Affecting Communication:  (soft spoken)  Cognition Arousal: Alert Behavior During Therapy: Flat affect   PT - Cognitive impairments: Awareness, Attention, Memory, Initiation, Sequencing, Problem solving,  Safety/Judgement                       PT -  Cognition Comments: continues to be delayed in her response to questions, able to recall sternal precautions, continues to have poor initiation and sequencing, poor motivation, requires significant cues Following commands: Impaired Following commands impaired: Follows one step commands inconsistently, Follows one step commands with increased time    Cueing Cueing Techniques: Tactile cues, Gestural cues, Verbal cues  Exercises General Exercises - Upper Extremity Shoulder Flexion: AROM, Both, 10 reps General Exercises - Lower Extremity Ankle Circles/Pumps: AROM, Both, 5 reps Long Arc Quad: AROM, Both, 10 reps, Seated    General Comments General comments (skin integrity, edema, etc.): HR 50-70, BP soft but stable      Pertinent Vitals/Pain Pain Assessment Pain Assessment: No/denies pain     PT Goals (current goals can now be found in the care plan section) Acute Rehab PT Goals Patient Stated Goal: agreeable to session, did not state a goal Progress towards PT goals: Progressing toward goals            Co-evaluation PT/OT/SLP Co-Evaluation/Treatment: Yes Reason for Co-Treatment: To address functional/ADL transfers;For patient/therapist safety;Complexity of the patient's impairments (multi-system involvement);Necessary to address cognition/behavior during functional activity PT goals addressed during session: Mobility/safety with mobility;Strengthening/ROM;Balance        AM-PAC PT 6 Clicks Mobility   Outcome Measure  Help needed turning from your back to your side while in a flat bed without using bedrails?: A Lot Help needed moving from lying on your back to sitting on the side of a flat bed without using bedrails?: Total Help needed moving to and from a bed to a chair (including a wheelchair)?: Total Help needed standing up from a chair using your arms (e.g., wheelchair or bedside chair)?: Total Help needed to walk in hospital room?: Total Help needed climbing 3-5 steps  with a railing? : Total 6 Click Score: 7    End of Session Equipment Utilized During Treatment: Gait belt Activity Tolerance: Patient limited by fatigue;Patient tolerated treatment well Patient left: with call bell/phone within reach;in bed;with bed alarm set Nurse Communication: Mobility status PT Visit Diagnosis: Other abnormalities of gait and mobility (R26.89);Muscle weakness (generalized) (M62.81);Difficulty in walking, not elsewhere classified (R26.2);Unsteadiness on feet (R26.81)     Time: 8540-8472 PT Time Calculation (min) (ACUTE ONLY): 28 min  Charges:    $Therapeutic Exercise: 8-22 mins PT General Charges $$ ACUTE PT VISIT: 1 Visit                     Izetta Call, PT, DPT   Acute Rehabilitation Department Office [phone] Secure Chat Communication Preferred   Izetta JULIANNA Call 02/25/2024, 3:40 PM

## 2024-02-25 NOTE — Consult Note (Signed)
 WOC Nurse wound follow up Refer to previous progress notes from 12/15.  Previously noted Deep tissue pressure injury to bilat buttocks and sacrum has resolved except to one area; this has evolved into a Stage 3 pressure injury to right buttock, red and moist, 1.5X1X.2cm, small amt yellow drainage.    Present on admission:  No  Pt is critically ill with multiple systemic factors which can impair healing. She is on a low air loss mattress to decrease pressure.  Continue present plan of care to promote healing.  Topical treatment orders provided for bedside nurses to perform as follows: R buttock: Cleanse wound with NS, pat dry. Apply Xeroform to wound bed and cover with silicone foam dressing. Change daily. The foam can stay up to 3 days if not saturated or soiled. Ok to lift, change the Xeroform and reapply. WOC team will reassess Q 7-10 days to determine if a change in the plan of care is indicated at that time.   Thank-you,  Stephane Fought MSN, RN, CWOCN, CWCN-AP, CNS Contact Mon-Fri 0700-1500: [phone]

## 2024-02-25 NOTE — Progress Notes (Signed)
 2.5 Liters ultrafiltration, condition stable, and report given to the primary RN.

## 2024-02-25 NOTE — Progress Notes (Addendum)
 Patient ID: Tracy Baker, female   DOB: September 17, 1955, 68 y.o.   MRN: [id number]     Advanced Heart Failure Rounding Note  Cardiologist: Lonni Cash, MD  HF Cardiologist: Dr. Zenaida  Chief Complaint: S/p MV Repair / MV  endocarditis   Patient Profile  Tracy Baker is a 68 y.o. female with hx of recent nephrolithiasis s/p ureteral stent and lithotripsy at [facility] 10/10 with stent removal. 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 Post-op course c/b post cardiotomy shock, junctional bradycardia, afib w/ RVR and HAP.   Significant events:   12/2 RHC (on 0.125 of Milrinone ): RA 4, PA 42/13 (22), PCW 12 with Vwaves to 20, CO/CI TD 3.37/2.03, PAPi 7.5 12/5 S/P Redo MVR - Intra Op given  4UPRBCs, 2UPLTs, 2FFP, 1Cyro 12/6 Protek Duo cannulation 12/8 Decannulated.  Post-op echo showed EF 50%, RV mildly dilated and mildly dysfunctional, s/p bioprosthetic mitral valve (stable). 12/13 CVVH begun 12/15: extubated 12/16: CRRT stopped.  12/17: Started DBA wean 12/19: Failed lasix  challenge. iHD   Subjective:    Remains on 2 DBA with Co-ox 58%.   CVP 16 this am. Last HD on 12/20. HD today then tunneled TDC placement.  Remains extremely weak. Unable to sit up without assistance.   Objective:    Weight Range: 74.1 kg Body mass index is 28.04 kg/m.   Vital Signs:   Temp:  [96.8 F (36 C)-99 F (37.2 C)] 96.8 F (36 C) (12/22 0700) Pulse Rate:  [56-69] 64 (12/22 0700) Resp:  [11-26] 15 (12/22 0700) BP: (97-141)/(50-86) 117/65 (12/22 0700) SpO2:  [94 %-100 %] 98 % (12/22 0700) Weight:  [74.1 kg] 74.1 kg (12/22 0500) Last BM Date : 02/25/24  Weight change: Filed Weights   02/23/24 1848 02/24/24 0500 02/25/24 0500  Weight: 76.5 kg 73.9 kg 74.1 kg   Intake/Output:  Intake/Output Summary (Last 24 hours) at 02/25/2024 0806 Last data filed at 02/25/2024 0700 Gross per 24 hour  Intake 1714.18 ml  Output 100 ml  Net 1614.18 ml   Physical  Exam   General:  Lying in bed. Weak appearing HEENT: + trialysis catheter Cor: JVP 16 cm. Irregular rhythm. No murmurs. Lungs: clear anteriorly Abdomen: soft, nontender, nondistended. Extremities: 1-2+ edema, + UNNA Neuro: alert & orientedx3. Affect flat   Telemetry   Afib with controlled rate    Labs    CBC Recent Labs    02/24/24 0514 02/25/24 0527  WBC 7.0 5.8  HGB 10.4* 9.9*  HCT 32.7* 31.1*  MCV 98.5 100.0  PLT 146* 137*   Basic Metabolic Panel Recent Labs    87/78/74 0514 02/25/24 0527  NA 133* 132*  K 4.7 5.1  CL 94* 94*  CO2 24 24  GLUCOSE 195* 194*  BUN 61* 91*  CREATININE 2.19* 3.03*  CALCIUM  8.9 9.0  MG 2.3 2.4  PHOS 3.8 4.5   Liver Function Tests Recent Labs    02/24/24 0514 02/25/24 0527  AST 26 19  ALT 18 17  ALKPHOS 267* 260*  BILITOT 1.1 1.0  PROT 6.1* 6.0*  ALBUMIN  3.5  3.5 3.4*  3.4*   Medications:    Scheduled Medications:  amiodarone   400 mg Per Tube BID   clonazePAM   0.5 mg Per Tube QHS   feeding supplement  237 mL Oral TID BM   feeding supplement (PROSource TF20)  60 mL Per Tube TID   fiber supplement (BANATROL TF)  60 mL Per Tube BID   Gerhardt's butt cream  Topical BID   insulin  aspart  0-9 Units Subcutaneous Q4H   insulin  glargine  15 Units Subcutaneous Daily   mirtazapine   30 mg Per Tube QHS   mouth rinse  15 mL Mouth Rinse 4 times per day   pantoprazole   40 mg Oral Daily   QUEtiapine   25 mg Per Tube QHS   scopolamine   1 patch Transdermal Q72H   sertraline   25 mg Per Tube QHS   sodium chloride  flush  10-40 mL Intracatheter Q12H    Infusions:  anticoagulant sodium citrate      DOBUTamine  2 mcg/kg/min (02/25/24 0700)   feeding supplement (VITAL 1.5 CAL) Stopped (02/25/24 0700)   fluconazole  (DIFLUCAN ) IV Stopped (02/24/24 1139)   heparin  1,150 Units/hr (02/25/24 0700)   promethazine  (PHENERGAN ) injection (IM or IVPB) Stopped (02/24/24 2135)    PRN Medications: albuterol , alteplase , anticoagulant sodium  citrate, heparin , lidocaine  (PF), lidocaine -prilocaine , mouth rinse, pentafluoroprop-tetrafluoroeth, polyethylene glycol, promethazine  (PHENERGAN ) injection (IM or IVPB), simethicone , sodium chloride  flush  Assessment/Plan   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 Kerrin for Candida endocarditis. Long-term fluconazole  per ID. Pre-op L/RHC 01/04/24 with no CAD and normal L/R heart pressures.  - Failed initial Milrinone  wean. Eventually weaned off, unfortunately restarted 12/1 for low output.  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 - Echo 12/19 EF 60% RV mildly HK  - Co-ox 58% on DBA 2. Would continue until fluid off with iHD - CVVH stopped 12/16. Failed lasix  challenge. Started iHD 12/19, next session this am - Volume slowly improving with iHD. Remains > 20 lb above pre-op weight.  Acute hypoxic respiratory failure: Suspect component of infection/aspiration PNA given unilateral disease.  - Respiratory culture- Enterobacter Completed cefepime  and vanc. - Extubated 12/15.  - stable on RA  Coagulopathy/thrombocytopenia/ABLA anemia:  - Slowly improving, up to 137K today.  Suspect this is inflammatory due to critical illness.  - Continue heparin   Sick sinus syndrome - Initially with asystole underneath (as of 12/10) - Now with atrial fibrillation and intermittent junctional rhythm  Afib, persistent - Continue heparin  gtt, eventually switch to po anticoagulation when no more procedures - Has received adequate amiodarone  load. Continue po amiodarone . No cardioversion for now with concerns for  bradycardia.  - AF rate controlled  AKI on CKD 3b - Stopped CVVH 12/16. Failed lasix  challenge. iHD 12/19. Tolerated well. - plan as above  Protein-calorie malnutrition - On TF - SLP following - Not taking in much po. Remeron  to stimulate appetite  Pressure Ulcer R Buttock, DTI - Q2 turns, offload  Left radial artery occlusion - Left ulnar artery is patent.  - Continue heparin  gtt  Remains extremely weak. Needs ongoing aggressive PT/OT. Needs more progress with therapy for CIR admission.  [facility], LINDSAY N PA-C 02/25/2024 8:06 AM  Patient seen and examined with the above-signed Advanced Practice Provider and/or Housestaff. I personally reviewed laboratory data, imaging studies and relevant notes. I independently examined the patient and formulated the important aspects of the plan. I have edited the note to reflect any of my changes or salient points. I have personally discussed the plan with the patient and/or family.  Tolerating iHD on low-dose  DBA. AF rate controlled. Very weak   General:  Sitting up in bed. No resp difficulty HEENT: normal Neck: supple. + HD cath JVP 10  Cor: Irregular rate & rhythm. No rubs, gallops or murmurs. Lungs: clear Abdomen: soft, nontender, nondistended.Good bowel sounds. Extremities: no cyanosis, clubbing, rash, 2+ edema Neuro: alert & orientedx3, cranial nerves grossly intact. moves all 4 extremities w/o difficulty. Affect pleasant  Continue volume removal with iHD and DBA support. For tunneled cath today. Continue PT/OT. Can switch heparin  to Eliquis  after line placement.   Toribio Fuel, MD  3:34 PM

## 2024-02-25 NOTE — Progress Notes (Signed)
" °  [facility] Progress Note    Assessment/ Plan:   AKI -b/l Cr 0.4-0.8. AKI likely secondary to ATN in the context of fungal endocarditis -CRRT started 12/13 now on TTS HD schedule (Holiday; next HD today) -Proceed with TDC -will cont to monitor for any recovery  Fungal endocarditis S/p MVR and redo on 12/5 RV failure -per primary, AHF, CTS. On fluconazole  -on dobuatmine-- wean as able  AHRF -extubated on 12/15 -currently stable -UF as tolerated with HD  Anemia -transfuse PRN for Hgb <7 -consider esa  Hyponatremia: mild. Volume removal with HD as able    FYI -- Holiday Schedule for next week Usual TTS schedule -> Mon (22), Wed (24), Sat (27)  Subjective:   Patient states she feels okay with no complaints.   Objective:   BP 117/65   Pulse 64   Temp (!) 96.8 F (36 C)   Resp 15   Ht 5' 4 (1.626 m)   Wt 74.1 kg   SpO2 98%   BMI 28.04 kg/m   Intake/Output Summary (Last 24 hours) at 02/25/2024 0845 Last data filed at 02/25/2024 0700 Gross per 24 hour  Intake 1714.18 ml  Output 100 ml  Net 1614.18 ml   Weight change: -5.4 kg  Physical Exam: Gen: ill/tired appearing, NAD CVS: normal rate, no rub Resp: bl chest rise, normal wob Abd: soft Ext: dependent edema present Neuro: awake, alert, drowsy Dialysis access: LIJ temp HD cath  Imaging: No results found.    Labs: BMET Recent Labs  Lab 02/20/24 0400 02/21/24 0435 02/21/24 1627 02/22/24 0452 02/23/24 0455 02/24/24 0514 02/25/24 0527  NA 138 136 134* 134* 133* 133* 132*  K 4.9 4.8 4.9 4.0 4.5 4.7 5.1  CL 103 98 97* 95* 95* 94* 94*  CO2 25 25 23 27 24 24 24   GLUCOSE 108* 201* 137* 124* 174* 195* 194*  BUN 51* 65* 75* 47* 81* 61* 91*  CREATININE 1.58* 2.02* 2.35* 1.79* 2.64* 2.19* 3.03*  CALCIUM  8.9 8.7* 9.1 9.1 9.0 8.9 9.0  PHOS 4.0 4.8* 5.1* 4.0 5.1* 3.8 4.5   CBC Recent Labs  Lab 02/22/24 0452 02/23/24 0455 02/24/24 0514 02/25/24 0527  WBC 7.6 6.4 7.0 5.8  HGB  10.6* 9.7* 10.4* 9.9*  HCT 33.3* 30.2* 32.7* 31.1*  MCV 99.1 98.4 98.5 100.0  PLT 102* 111* 146* 137*    Medications:     amiodarone   400 mg Per Tube BID   clonazePAM   0.5 mg Per Tube QHS   feeding supplement  237 mL Oral TID BM   feeding supplement (PROSource TF20)  60 mL Per Tube TID   fiber supplement (BANATROL TF)  60 mL Per Tube BID   Gerhardt's butt cream   Topical BID   insulin  aspart  0-9 Units Subcutaneous Q4H   insulin  glargine  15 Units Subcutaneous Daily   mirtazapine   30 mg Per Tube QHS   mouth rinse  15 mL Mouth Rinse 4 times per day   pantoprazole   40 mg Oral Daily   QUEtiapine   25 mg Per Tube QHS   scopolamine   1 patch Transdermal Q72H   sertraline   25 mg Per Tube QHS   sodium chloride  flush  10-40 mL Intracatheter Q12H       "

## 2024-02-25 NOTE — Progress Notes (Signed)
 Nutrition Follow-up  DOCUMENTATION CODES:  Severe malnutrition in context of acute illness/injury  INTERVENTION:  D/C calorie count. Resume 24 h tube feedings: Vital 1.5 at 50 ml/h Prosource TF20 60 ml once daily Provides: 1880 kcal, 101 gm protein, 916 ml free water  daily Continue Banatrol TF 1 packet (60 ml) BID via tube, each packet provides 5 gm soluble fiber to aid in bulking stool Continue Ensure Plus High Protein po BID, each supplement provides 350 kcal and 20 grams of protein  NUTRITION DIAGNOSIS:  Severe Malnutrition related to acute illness as evidenced by moderate muscle depletion, mild muscle depletion, energy intake < or equal to 50% for > or equal to 5 days; ongoing.  GOAL:  Patient will meet greater than or equal to 90% of their needs; unmet.  MONITOR:  Vent status, TF tolerance, Labs, Weight trends  REASON FOR ASSESSMENT:  Consult Other (Comment) (new ECMO)  ASSESSMENT:  67 yo female admitted with hx of Calbicans candidemia, being treated at [facility] in [place] but left AMA and presents with MV endocarditis requiring MVR, post-op course complicated by shock post-op, junctional bradycardia, afib with RVR and pneumonia. PMH includes nephrolithiasis s/p ureteral stent and lithotripsy  Patient reports ongoing poor intake r/t dry mouth and decreased appetite.  She is just taking bites and sips of meals.   Current TF order: Vital 1.5 at 60 ml/h x 12 h with Prosource TF20 60 ml TID to provide 1320 kcal, 109 gm protein, 547 ml free water  daily.  Calorie Count (Regular diet) 12/19 Lunch: 1 bite banana pudding, 1 sip of Oikos protein shake, ice chips 12/19 Dinner: 1 sip apple juice, ice chips 12/20: no records of PO intake  12/21 Breakfast: 0% 12/21 Lunch: 5% 12/21 Dinner: 10%  Patient is NPO today for tunneled TDC placement.  Oral intake is meeting < 5% of nutrition needs. Will change back to continuous TF x 24 h per day after procedure.   Labs reviewed.  Na  132 CBG: [phone]  Medications reviewed and include novolog  SSI q4h, lantus , remeron , protonix .  Last HD this morning with 2.5 L ultrafiltration.  UOP: 100 ml x 24 h  Per WOC RN note, DTI to bil buttocks and sacrum has resolved except one area that has evolved into stage 3 PI to R buttock.   Diet Order:   Diet Order             Diet NPO time specified Except for: Sips with Meds  Diet effective midnight                   EDUCATION NEEDS:  Education needs have been addressed  Skin:  Skin Assessment: Skin Integrity Issues: Skin Integrity Issues:: Stage III DTI: - Stage III: R buttocks Incisions: chest (closed)  Last BM:  12/22 type 6  Height:  Ht Readings from Last 1 Encounters:  02/15/24 5' 4 (1.626 m)   Weight:  Wt Readings from Last 1 Encounters:  02/25/24 74.1 kg   Ideal Body Weight:  54.5 kg  BMI:  Body mass index is 28.04 kg/m.  Estimated Nutritional Needs:  Kcal:  1900-2100 kcals Protein:  100-115g Fluid:  1.5 L   Suzen HUNT RD, LDN, CNSC Contact via secure chat. If unavailable, use group chat RD Inpatient.

## 2024-02-25 NOTE — Progress Notes (Signed)
 "  NAME:  Tracy Baker, MRN:  [id number], DOB:  [birth year], LOS: 54 ADMISSION DATE:  12/28/2023, CONSULTATION DATE:  02/08/24 REFERRING MD:  Kerrin RAMAN., CHIEF COMPLAINT:  s/p MVR   History of Present Illness:  Tracy Baker is a 68 year old female with past medical history significant for nephrolithiasis s/p ureteral stent and lithotripsy at [facility] [place] 10/10, with stent removal, severe MR, MV repair (01/10/24) due to MV Candida endocarditis c/b continued severe MR, who presents today for a redo sternotomy and MVR w/ Dr. Kerrin. PCCM consulted for ICU post-op vent management initially.  Pt ended up requiring Protek duo   Pertinent Medical History:  Severe MR Candida endocarditis Post-op Afib CKD 3b Significant Hospital Events: Including procedures, antibiotic start and stop dates in addition to other pertinent events   12/5: redo sternotomy, MVR Dr. Kerrin 12/6: Overnight patient started requiring more vasopressor support, early morning she was on dobutamine  5, milrinone  0.375, epinephrine  at 10, vasopressin  0.04 and norepinephrine  34 mics, she is not making urine.  Cannulated with Protek duo for RV support,  12/7 bronch, broadened antibiotics 12/8 decannulated from VV ECMO after sweep and flow wean trials. Lasix  stopped. Restarted TF. 12/10 chest tubes out, off pressors. Aline removed. 12/13 started CRRT 12/15 extubated, weak, continued fevers remains on CRRT 12/16 CRRT off this AM, less delirious, LE dopplers neg 12/17 off CRRT, trial diuresis  12/18 failed lasix  challenge, iHD  Interim History / Subjective:  HD today; IR @ 13:00 for TDC; TCTS w/ plans to pull pacer wires post IR. Severely deconditioned will likely need SNF dialysis.  Objective    Blood pressure 109/74, pulse 72, temperature (!) 97.2 F (36.2 C), resp. rate 15, height 5' 4 (1.626 m), weight 74.1 kg, SpO2 100%. CVP:  [17 mmHg-35 mmHg] 17 mmHg      Intake/Output Summary (Last 24 hours) at 02/25/2024  0932 Last data filed at 02/25/2024 0900 Gross per 24 hour  Intake 1681.04 ml  Output 100 ml  Net 1581.04 ml   Filed Weights   02/23/24 1848 02/24/24 0500 02/25/24 0500  Weight: 76.5 kg 73.9 kg 74.1 kg   General: acutely-ill elderly woman lying in bed on HD, in NAD HEENT: AT/[place], PERRL, 3mm bilaterally Pulm: normal inspiratory and expiratory effort on RA, CTAB CV: Afib, midline incision approx with staples, c/d/I; pacer wires in place GI: soft, non distended, non tender to palpation Neuro: A&O x3, no focal deficits   Patient Lines/Drains/Airways Status     Active Line/Drains/Airways     Name Placement date Placement time Site Days   PICC Double Lumen 01/15/24 Right Basilic 35 cm 0 cm 01/15/24  8384  -- 41   Hemodialysis Catheter Left Internal jugular Triple lumen Temporary (Non-Tunneled) 02/16/24  1600  Internal jugular  9   Urethral Catheter Lauraine Holts, RN Temperature probe 14 Fr. 02/18/24  1422  Temperature probe  7   Small Bore Feeding Tube Left nare Marking at nare/corner of mouth 67 cm 02/15/24  1135  Left nare  10   Wound 01/16/24 0800 Traumatic Back Left;Upper 01/16/24  0800  Back  40   Wound 02/08/24 Surgical Closed Surgical Incision Sternum Mid 02/08/24  --  Sternum  17   Wound 02/12/24 1800 Pressure Injury Buttocks Right;Medial Stage 3 -  Full thickness tissue loss. Subcutaneous fat may be visible but bone, tendon or muscle are NOT exposed. 02/12/24  1800  Buttocks  13   Wound 02/13/24 1900 Irritant Contact Dermatitis Neck Right 02/13/24  1900  Neck  12   Wound 02/14/24 0800 Other (Comment) Groin Bilateral 02/14/24  0800  Groin  11           Resolved Problem List:   Hypokalemia Ileus RUL pneumonia- Enterobacter; s/p course of cefepime  Acute blood loss anemia status post multiple PRBC transfusion Thrombocytopenia due to critical illness status post 2 unit platelets Coagulopathy, probably from liver congestion requiring FFP  transfusion Hyperbilirubinemia  Assessment and Plan:   MV endocarditis s/p MV repair 01/10/2024, with persistent severe mitral regurgitation and status post redo sternotomy and bioprosthetic MVR Previous candidemia Acute RV failure with cardiogenic shock status post Protek duo VV ECMO cannula on 12/6, decannulated 12/8 - Prolonged course of fluconazole  per ID - Dobutamine  per HF team  AKI on CKD stage IIIa - HD today  Hypoglycemia improved; now mild hyperglycemia on TF H/o prediabetes, A1c 6-most recently 5.4 - Continue SSI  Severe protein calorie malnutrition-  - Poor PO intake, on remeron   - Supplemental nutrition with tube feeds  Deconditioning, prolonged course of illness leading up to this  - PT/OT -Will likely need SNF w/ dialysis   Left radial artery occlusion with patent left ulnar artery - Heparin  infusion  Pressure ulcer on buttocks skin breakdown from bandage on R neck - Mobilize  Warren Shade, DNP, AGACNP-BC [facility]  Please see [email or web address] for pager details.  From 7A-7P if no response, please call [phone]. After hours, please call ELink [phone].  "

## 2024-02-25 NOTE — Progress Notes (Signed)
 PHARMACY - ANTICOAGULATION CONSULT NOTE  Pharmacy Consult for heparin  Indication: atrial fibrillation  Allergies[1]  Patient Measurements: Height: 5' 4 (162.6 cm) Weight: 74.1 kg (163 lb 5.8 oz) IBW/kg (Calculated) : 54.7 HEPARIN  DW (KG): 66.5  Vital Signs: Temp: 97.3 F (36.3 C) (12/22 1200) Temp Source: Bladder (12/22 1200) BP: 107/71 (12/22 1355) Pulse Rate: 103 (12/22 1355)  Labs: Recent Labs    02/23/24 0455 02/24/24 0514 02/25/24 0527  HGB 9.7* 10.4* 9.9*  HCT 30.2* 32.7* 31.1*  PLT 111* 146* 137*  HEPARINUNFRC 0.32 0.29* 0.40  CREATININE 2.64* 2.19* 3.03*    Estimated Creatinine Clearance: 17.5 mL/min (A) (by C-G formula based on SCr of 3.03 mg/dL (H)).   Medical History: Past Medical History:  Diagnosis Date   Medical history non-contributory    Thyroid  disease    Vertigo     Medications:  Scheduled:   amiodarone   400 mg Per Tube BID   clonazePAM   0.5 mg Per Tube QHS   feeding supplement  237 mL Oral TID BM   [START ON 02/26/2024] feeding supplement (PROSource TF20)  60 mL Per Tube Daily   fiber supplement (BANATROL TF)  60 mL Per Tube BID   Gerhardt's butt cream   Topical BID   insulin  aspart  0-9 Units Subcutaneous Q4H   insulin  glargine  15 Units Subcutaneous Daily   mirtazapine   30 mg Per Tube QHS   mouth rinse  15 mL Mouth Rinse 4 times per day   pantoprazole   40 mg Oral Daily   QUEtiapine   25 mg Per Tube QHS   scopolamine   1 patch Transdermal Q72H   sertraline   25 mg Per Tube QHS   sodium chloride  flush  10-40 mL Intracatheter Q12H    Assessment: Ms. Getchell is a 24 YOF now s/p MV repair and redo on 12/5. Hospitalization complicated by acute RV failure with cardiogenic shock s/p Protek duo VV ECMO cannula. Patient also with atrial fibrillation throughout this hospitalization.  Discussed with CCM/HF team and decision made to start low-dose heparin  infusion 12/14. Pharmacy consulted for heparin  dosing.   Heparin  level at goal today 0.4 on  1150 units/hr, CBC ok. No bleeding issues.  Goal of Therapy:  Heparin  level 0.3-0.5 Monitor platelets by anticoagulation protocol: Yes   Plan:  Continue heparin  infusion at 1150 units/hr  Daily heparin  level, CBC  Harlene Barlow, [employer], BCCP Clinical Pharmacist  02/25/2024 2:28 PM   [facility] pharmacy phone numbers are listed on [email or web address]           [1]  Allergies Allergen Reactions   Codeine Anaphylaxis and Nausea And Vomiting   Bactoshield Chg [Chlorhexidine  Gluconate] Dermatitis   Chlorhexidine  Dermatitis and Rash   Red Dye #40 (Allura Red) Nausea Only

## 2024-02-25 NOTE — Progress Notes (Signed)
 TCTS Evening Rounds:  Hemodynamically stable in sinus rhythm. Heparin  stopped for several hours today and I removed the pacing wires without difficulty. Will resume heparin  in an hour if she remains stable.  Dobut 2 continues.   Had HD today and removed 2500 cc.  Worked with PT/OT

## 2024-02-25 NOTE — Plan of Care (Signed)

## 2024-02-26 DIAGNOSIS — R570 Cardiogenic shock: Secondary | ICD-10-CM | POA: Diagnosis not present

## 2024-02-26 DIAGNOSIS — N1831 Chronic kidney disease, stage 3a: Secondary | ICD-10-CM | POA: Diagnosis not present

## 2024-02-26 DIAGNOSIS — B377 Candidal sepsis: Secondary | ICD-10-CM | POA: Diagnosis not present

## 2024-02-26 DIAGNOSIS — N179 Acute kidney failure, unspecified: Secondary | ICD-10-CM | POA: Diagnosis not present

## 2024-02-26 LAB — RENAL FUNCTION PANEL
Albumin: 3.7 g/dL (ref 3.5–5.0)
Anion gap: 12 (ref 5–15)
BUN: 58 mg/dL — ABNORMAL HIGH (ref 8–23)
CO2: 27 mmol/L (ref 22–32)
Calcium: 9.3 mg/dL (ref 8.9–10.3)
Chloride: 95 mmol/L — ABNORMAL LOW (ref 98–111)
Creatinine, Ser: 2.45 mg/dL — ABNORMAL HIGH (ref 0.44–1.00)
GFR, Estimated: 21 mL/min — ABNORMAL LOW
Glucose, Bld: 146 mg/dL — ABNORMAL HIGH (ref 70–99)
Phosphorus: 4.2 mg/dL (ref 2.5–4.6)
Potassium: 4.8 mmol/L (ref 3.5–5.1)
Sodium: 135 mmol/L (ref 135–145)

## 2024-02-26 LAB — GLUCOSE, CAPILLARY
Glucose-Capillary: 112 mg/dL — ABNORMAL HIGH (ref 70–99)
Glucose-Capillary: 125 mg/dL — ABNORMAL HIGH (ref 70–99)
Glucose-Capillary: 134 mg/dL — ABNORMAL HIGH (ref 70–99)
Glucose-Capillary: 146 mg/dL — ABNORMAL HIGH (ref 70–99)
Glucose-Capillary: 167 mg/dL — ABNORMAL HIGH (ref 70–99)
Glucose-Capillary: 169 mg/dL — ABNORMAL HIGH (ref 70–99)
Glucose-Capillary: 180 mg/dL — ABNORMAL HIGH (ref 70–99)

## 2024-02-26 LAB — COOXEMETRY PANEL
Carboxyhemoglobin: 2.6 % — ABNORMAL HIGH (ref 0.5–1.5)
Methemoglobin: <0.7 % (ref 0.0–1.5)
O2 Saturation: 60.6 %
Total hemoglobin: 10.1 g/dL — ABNORMAL LOW (ref 12.0–16.0)

## 2024-02-26 LAB — CBC
HCT: 32 % — ABNORMAL LOW (ref 36.0–46.0)
Hemoglobin: 10.2 g/dL — ABNORMAL LOW (ref 12.0–15.0)
MCH: 32.2 pg (ref 26.0–34.0)
MCHC: 31.9 g/dL (ref 30.0–36.0)
MCV: 100.9 fL — ABNORMAL HIGH (ref 80.0–100.0)
Platelets: 153 K/uL (ref 150–400)
RBC: 3.17 MIL/uL — ABNORMAL LOW (ref 3.87–5.11)
RDW: 19.8 % — ABNORMAL HIGH (ref 11.5–15.5)
WBC: 6.3 K/uL (ref 4.0–10.5)
nRBC: 0 % (ref 0.0–0.2)

## 2024-02-26 LAB — HEPATIC FUNCTION PANEL
ALT: 15 U/L (ref 0–44)
AST: 21 U/L (ref 15–41)
Albumin: 3.6 g/dL (ref 3.5–5.0)
Alkaline Phosphatase: 269 U/L — ABNORMAL HIGH (ref 38–126)
Bilirubin, Direct: 0.6 mg/dL — ABNORMAL HIGH (ref 0.0–0.2)
Indirect Bilirubin: 0.4 mg/dL (ref 0.3–0.9)
Total Bilirubin: 1 mg/dL (ref 0.0–1.2)
Total Protein: 6.2 g/dL — ABNORMAL LOW (ref 6.5–8.1)

## 2024-02-26 LAB — HEPARIN LEVEL (UNFRACTIONATED): Heparin Unfractionated: 0.38 [IU]/mL (ref 0.30–0.70)

## 2024-02-26 LAB — MAGNESIUM: Magnesium: 2.4 mg/dL (ref 1.7–2.4)

## 2024-02-26 NOTE — Progress Notes (Signed)
 Physical Therapy Treatment Patient Details Name: Tracy Baker MRN: [id number] DOB: 1955-08-16 Today's Date: 02/26/2024   History of Present Illness 68 y.o. female adm 12/29/2023 after leaving AMA from [facility] (where she was on vacation due to lack of formal transfer) where she was being treated for C. Albicans candidemia. 10/28 TEE with mitral valve vegetation. 11/3 teeth Extractions. 11/6 Mitral valve repair. 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 Extubated 12/15. PMHx: thyroid  disease.    PT Comments  The pt was agreeable to session, demos great improvement in activity tolerance and mobility at this time. The pt completed x2 walking bouts with modA of 1-2 with chair follow and eva walker for safety. Pt required seated rest after ~8 ft with modA of 1, but then was able to complete additional 12 ft with modA of 2. Pt demos poor power and strength in BLE, narrow BOS, and limited muscular endurance. Continue to recommend intensive therapies after d/c as pt showing good signs of improved tolerance and continues to be far from her baseline mobility and independence.    If plan is discharge home, recommend the following: Assistance with cooking/housework;Assist for transportation;Help with stairs or ramp for entrance;Two people to help with walking and/or transfers;Two people to help with bathing/dressing/bathroom;Assistance with feeding;Direct supervision/assist for medications management;Direct supervision/assist for financial management   Can travel by private vehicle        Equipment Recommendations  Other (comment) (TBD)    Recommendations for Other Services       Precautions / Restrictions Precautions Precautions: Fall;Sternal Recall of Precautions/Restrictions: Impaired Precaution/Restrictions Comments: cortrak Restrictions Weight Bearing Restrictions Per Provider Order: No Other Position/Activity Restrictions:  Cardiac sternal precautions     Mobility  Bed Mobility Overal bed mobility: Needs Assistance Bed Mobility: Sit to Sidelying, Rolling Rolling: Mod assist       Sit to sidelying: +2 for physical assistance, Mod assist General bed mobility comments: verbal and tactile cues, assist to LE to return to bed and max cues for bridge to reposition    Transfers Overall transfer level: Needs assistance Equipment used: None Transfers: Sit to/from Stand Sit to Stand: +2 physical assistance, Max assist, Min assist           General transfer comment: minA of 1-2 initially, progressed to maxA with fatigue by end of session    Ambulation/Gait Ambulation/Gait assistance: Mod assist, +2 safety/equipment Gait Distance (Feet): 8 Feet (+ 12 ft) Assistive device: Elyn Finder Gait Pattern/deviations: Step-to pattern, Decreased stride length, Drifts right/left, Trunk flexed, Narrow base of support Gait velocity: decreased Gait velocity interpretation: <1.31 ft/sec, indicative of household ambulator   General Gait Details: pt with small steps and dependent on BUE support on eva walker. assist to guide walker, encourage trunk upright, and stide length. no overt buckling but increased assist for balance with fatigue      Balance Overall balance assessment: Needs assistance Sitting-balance support: No upper extremity supported, Feet unsupported Sitting balance-Leahy Scale: Poor Sitting balance - Comments: min-modA trunk support to maintain static sitting, increased assist with faitgue Postural control: Posterior lean Standing balance support: Bilateral upper extremity supported, During functional activity Standing balance-Leahy Scale: Poor Standing balance comment: dependent on BUE support and external assist                            Communication Communication Communication: Impaired Factors Affecting Communication:  (soft spoken)  Cognition Arousal: Alert  Behavior During  Therapy: Flat affect   PT - Cognitive impairments: Awareness, Attention, Memory, Initiation, Sequencing, Problem solving, Safety/Judgement                       PT - Cognition Comments: pt with improved attention and alertness today, maintains soft voice and minimal verbalizations until prompted. not formally assessed Following commands: Impaired Following commands impaired: Follows one step commands inconsistently, Follows one step commands with increased time    Cueing Cueing Techniques: Tactile cues, Gestural cues, Verbal cues  Exercises General Exercises - Lower Extremity Ankle Circles/Pumps: AROM, Both, 10 reps Quad Sets: AROM, Both, 10 reps Heel Slides: AROM, Both, 10 reps, Supine    General Comments General comments (skin integrity, edema, etc.): VSS on RA      Pertinent Vitals/Pain Pain Assessment Pain Assessment: No/denies pain    Home Living                          Prior Function            PT Goals (current goals can now be found in the care plan section) Acute Rehab PT Goals Patient Stated Goal: to return home Progress towards PT goals: Progressing toward goals    Frequency    Min 3X/week       AM-PAC PT 6 Clicks Mobility   Outcome Measure  Help needed turning from your back to your side while in a flat bed without using bedrails?: A Lot Help needed moving from lying on your back to sitting on the side of a flat bed without using bedrails?: A Lot Help needed moving to and from a bed to a chair (including a wheelchair)?: A Lot Help needed standing up from a chair using your arms (e.g., wheelchair or bedside chair)?: A Lot Help needed to walk in hospital room?: A Lot Help needed climbing 3-5 steps with a railing? : Total 6 Click Score: 11    End of Session Equipment Utilized During Treatment: Gait belt Activity Tolerance: Patient limited by fatigue;Patient tolerated treatment well Patient left: with call bell/phone within  reach;in bed;with bed alarm set Nurse Communication: Mobility status PT Visit Diagnosis: Other abnormalities of gait and mobility (R26.89);Muscle weakness (generalized) (M62.81);Difficulty in walking, not elsewhere classified (R26.2);Unsteadiness on feet (R26.81)     Time: 8853-8794 PT Time Calculation (min) (ACUTE ONLY): 19 min  Charges:    $Gait Training: 8-22 mins PT General Charges $$ ACUTE PT VISIT: 1 Visit                     Izetta Call, PT, DPT   Acute Rehabilitation Department Office [phone] Secure Chat Communication Preferred   Izetta JULIANNA Call 02/26/2024, 12:37 PM

## 2024-02-26 NOTE — Plan of Care (Signed)
  Problem: Education: Goal: Knowledge of General Education information will improve Description: Including pain rating scale, medication(s)/side effects and non-pharmacologic comfort measures Outcome: Progressing   Problem: Clinical Measurements: Goal: Ability to maintain clinical measurements within normal limits will improve Outcome: Progressing Goal: Will remain free from infection Outcome: Progressing Goal: Diagnostic test results will improve Outcome: Progressing Goal: Respiratory complications will improve Outcome: Progressing Goal: Cardiovascular complication will be avoided Outcome: Progressing   Problem: Activity: Goal: Risk for activity intolerance will decrease Outcome: Progressing   Problem: Nutrition: Goal: Adequate nutrition will be maintained Outcome: Progressing   Problem: Coping: Goal: Level of anxiety will decrease Outcome: Progressing   Problem: Elimination: Goal: Will not experience complications related to bowel motility Outcome: Progressing Goal: Will not experience complications related to urinary retention Outcome: Progressing   Problem: Pain Managment: Goal: General experience of comfort will improve and/or be controlled Outcome: Progressing   Problem: Safety: Goal: Ability to remain free from injury will improve Outcome: Progressing   Problem: Skin Integrity: Goal: Risk for impaired skin integrity will decrease Outcome: Progressing   Problem: Education: Goal: Understanding of CV disease, CV risk reduction, and recovery process will improve Outcome: Progressing Goal: Individualized Educational Video(s) Outcome: Progressing   Problem: Activity: Goal: Ability to return to baseline activity level will improve Outcome: Progressing   Problem: Cardiovascular: Goal: Ability to achieve and maintain adequate cardiovascular perfusion will improve Outcome: Progressing Goal: Vascular access site(s) Level 0-1 will be maintained Outcome:  Progressing

## 2024-02-26 NOTE — Progress Notes (Signed)
 " [facility] Progress Note    Assessment/ Plan:   AKI -b/l Cr 0.4-0.8. 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 On antifungal therapy -on dobuatmine-- wean as able  AHRF -extubated on 12/15 -currently stable -UF as tolerated with HD  Anemia -transfuse PRN for Hgb <7 -consider esa  Hyponatremia: mild and intermittent.  Manage volume with dialysis    FYI -- Holiday Schedule for next week Usual TTS schedule -> Mon (22), Wed (24), Sat (27)  Subjective:   Patient having wound cared for when I saw her.  Denies any specific complaints.  Tolerated dialysis well with no issues   Objective:   BP 94/67   Pulse 63   Temp 97.9 F (36.6 C) (Oral)   Resp 16   Ht 5' 4 (1.626 m)   Wt 71.3 kg   SpO2 100%   BMI 26.98 kg/m   Intake/Output Summary (Last 24 hours) at 02/26/2024 9161 Last data filed at 02/26/2024 0400 Gross per 24 hour  Intake 1211.54 ml  Output 2515 ml  Net -1303.46 ml   Weight change: -2.8 kg  Physical Exam: Gen: ill/tired appearing, NAD CVS: normal rate, no rub Resp: bl chest rise, normal wob Abd: soft Ext: dependent edema present Neuro: awake, alert, drowsy Dialysis access: LIJ temp HD cath  Imaging: IR TUNNELED CENTRAL VENOUS CATH [facility] W IMG Result Date: 02/25/2024 INDICATION: Renal failure requiring prolonged hemodialysis. EXAM: TUNNELED hemodialysis catheter placement ULTRASOUND AND FLUOROSCOPIC GUIDANCE MEDICATIONS: 2 g Ancef  IV. The antibiotic was given in an appropriate time interval prior to skin puncture. ANESTHESIA/SEDATION: Moderate (conscious) sedation was employed during this procedure. A total of Versed  1.5 mg and Fentanyl  25 mcg was administered  intravenously by the radiology nurse. Total intra-service moderate Sedation Time: 17 minutes. The patient's level of consciousness and vital signs were monitored continuously by radiology nursing throughout the procedure under my direct supervision. FLUOROSCOPY: Radiation Exposure Index (as provided by the fluoroscopic device): 10 mGy Kerma COMPLICATIONS: None immediate. PROCEDURE: Informed written consent was obtained from the patient after a discussion of the risks, benefits, and alternatives to treatment. Questions regarding the procedure were encouraged and answered. The left neck and chest were prepped with chlorhexidine  in a sterile fashion, and a sterile drape was applied covering the operative field. Maximum barrier sterile technique with sterile gowns and gloves were used for the procedure. A timeout was performed prior to the initiation of the procedure. After creating a small venotomy incision, a micropuncture kit was utilized to access the left internal jugular vein under direct, real-time ultrasound guidance after the overlying soft tissues were anesthetized with 1% lidocaine  with epinephrine . Ultrasound image documentation was performed. The microwire was kinked to measure appropriate catheter length. The micropuncture sheath was exchanged for a peel-away sheath over a guidewire. A 28 cm tunneled hemodialysis palindrome catheter was tunneled in a retrograde fashion from the anterior chest wall to the venotomy incision. The catheter was then placed through the peel-away sheath with tip ultimately positioned at the superior caval-atrial junction. Final catheter positioning was confirmed and documented with a spot radiographic image. The catheter aspirates and flushes normally. The catheter was flushed with appropriate volume heparin  dwells. The catheter exit site was secured  with a 0-Prolene retention suture. The venotomy incision was closed withDermabond and Steri-strips. Dressings were applied. The  patient tolerated the procedure well without immediate post procedural complication. FINDINGS: After catheter placement, the tip lies within the superior cavoatrial junction. The catheter aspirates and flushes normally and is ready for immediate use. IMPRESSION: Successful placement of 28 cm tunneled hemodialysis palindrome catheter via the left internal jugular vein with tip terminating at the superior caval atrial junction. The catheter is ready for immediate use. Electronically Signed   By: Cordella Banner   On: 02/25/2024 14:58      Labs: BMET Recent Labs  Lab 02/21/24 9564 02/21/24 1627 02/22/24 0452 02/23/24 0455 02/24/24 0514 02/25/24 0527 02/26/24 0423  NA 136 134* 134* 133* 133* 132* 135  K 4.8 4.9 4.0 4.5 4.7 5.1 4.8  CL 98 97* 95* 95* 94* 94* 95*  CO2 25 23 27 24 24 24 27   GLUCOSE 201* 137* 124* 174* 195* 194* 146*  BUN 65* 75* 47* 81* 61* 91* 58*  CREATININE 2.02* 2.35* 1.79* 2.64* 2.19* 3.03* 2.45*  CALCIUM  8.7* 9.1 9.1 9.0 8.9 9.0 9.3  PHOS 4.8* 5.1* 4.0 5.1* 3.8 4.5 4.2   CBC Recent Labs  Lab 02/23/24 0455 02/24/24 0514 02/25/24 0527 02/26/24 0423  WBC 6.4 7.0 5.8 6.3  HGB 9.7* 10.4* 9.9* 10.2*  HCT 30.2* 32.7* 31.1* 32.0*  MCV 98.4 98.5 100.0 100.9*  PLT 111* 146* 137* 153    Medications:     amiodarone   400 mg Per Tube BID   clonazePAM   0.5 mg Per Tube QHS   feeding supplement  237 mL Oral TID BM   feeding supplement (PROSource TF20)  60 mL Per Tube Daily   fiber supplement (BANATROL TF)  60 mL Per Tube BID   Gerhardt's butt cream   Topical BID   insulin  aspart  0-9 Units Subcutaneous Q4H   insulin  glargine  15 Units Subcutaneous Daily   mirtazapine   30 mg Per Tube QHS   mouth rinse  15 mL Mouth Rinse 4 times per day   pantoprazole   40 mg Oral Daily   QUEtiapine   25 mg Per Tube QHS   scopolamine   1 patch Transdermal Q72H   sertraline   25 mg Per Tube QHS   sodium chloride  flush  10-40 mL Intracatheter Q12H       "

## 2024-02-26 NOTE — Progress Notes (Signed)
 Brief Nutrition Follow-up:  Received secure chat from team regarding TF and overall nutrition poc.   Current barriers to increased po intake include persistent dry mouth, flat affect (currently on remeron )/depressed mood and no appetite. Pt denies in GI issues such as N/V or abdominal discomfort. Pt denies feeling overly full.  Dr. Daniel inquiring re: need for PEG placement. Pt would be appropriate and would benefit from PEG placement to further assist with continued progression if pt is agreeable. Pt with ongoing malnutrition with poor appetite with minimal intake since admission on 01/02/24 (with reported poor po intake prior to admission per family) and will likely benefit from continued nutrition support. Dr. Daniel to discuss with patient/family. If PEG placed, Cortrak can be removed, questions if this might assist to some degree with increased oral intake. Can trial bolus feedings as well   Nocturnal TF tried over the weekend with calorie count to assess needs.   Calorie Count (Regular diet): Per RD Assessment on 02/25/24. Pt on 12-hour nocturnal TF during Calorie Count 12/19 Lunch: 1 bite banana pudding, 1 sip of Oikos protein shake, ice chips 12/19 Dinner: 1 sip apple juice, ice chips 12/20: no records of PO intake  12/21 Breakfast: 0% 12/21 Lunch: 5% 12/21 Dinner: 10%  Pt reports she is drinking Ensure but not much. Pt indicates she does not  TF switched back to 24 hour full feedings yesterday  Further recommendations pending decision surrounding PEG.   Interventions: Continue TF to meet nutritional needs  Vital 1.5 at 50 ml/h Prosource TF20 60 ml once daily Provides: 1880 kcal, 101 gm protein, 916 ml free water  daily Continue Banatrol TF 1 packet (60 ml) BID via tube, each packet provides 5 gm soluble fiber to aid in bulking stool Continue Regular diet Continue Ensure Plus High Protein po TID, each supplement provides 350 kcal and 20 grams of protein  [name] MS, RDN, LDN,  CNSC Registered Dietitian 3 Clinical Nutrition RD Inpatient Contact Info in [place]

## 2024-02-26 NOTE — Progress Notes (Signed)
" °  Inpatient Rehabilitation Admissions Coordinator   I will place rehab consult order and discuss case with Rehab MD. Previously denied for CIR by insurance twice, due to she was at the time postop the first surgery and was CGA assist level. Now with second surgery, requiring hemodialysis,etc. With a significant decline in function.  Heron Leavell, RN, MSN Rehab Admissions Coordinator [phone] 02/26/2024 12:53 PM  "

## 2024-02-26 NOTE — Progress Notes (Signed)
" °  Subjective: No issues overnight, remains on Dobutamine  at 2,Co-ox stable at 60 Tunneled iHD line placed yesterday  Objective: Vital signs in last 24 hours: BP 94/67   Pulse 63   Temp 97.9 F (36.6 C) (Oral)   Resp 16   Ht 5' 4 (1.626 m)   Wt 71.3 kg   SpO2 100%   BMI 26.98 kg/m  Filed Weights   02/24/24 0500 02/25/24 0500 02/26/24 0441  Weight: 73.9 kg 74.1 kg 71.3 kg    Hemodynamic parameters for last 24 hours: CVP:  [63 mmHg] 63 mmHg  Intake/Output from previous day: 12/22 0701 - 12/23 0700 In: 1225 [I.V.:191.6; NG/GT:733.3; IV Piggyback:300] Out: 2515 [Urine:15] Intake/Output this shift: No intake/output data recorded.  Physical Exam: General - Resting comfortably in bed CV - Afib Resp - Unlabored on room air Abd - Soft, ND/NT Ext - Edema improving  Lab Results:    Latest Ref Rng & Units 02/26/2024    4:23 AM 02/25/2024    5:27 AM 02/24/2024    5:14 AM  CBC  WBC 4.0 - 10.5 K/uL 6.3  5.8  7.0   Hemoglobin 12.0 - 15.0 g/dL 89.7  9.9  89.5   Hematocrit 36.0 - 46.0 % 32.0  31.1  32.7   Platelets 150 - 400 K/uL 153  137  146       Latest Ref Rng & Units 02/26/2024    4:23 AM 02/25/2024    5:27 AM 02/24/2024    5:14 AM  CMP  Glucose 70 - 99 mg/dL 853  805  804   BUN 8 - 23 mg/dL 58  91  61   Creatinine 0.44 - 1.00 mg/dL 7.54  6.96  7.80   Sodium 135 - 145 mmol/L 135  132  133   Potassium 3.5 - 5.1 mmol/L 4.8  5.1  4.7   Chloride 98 - 111 mmol/L 95  94  94   CO2 22 - 32 mmol/L 27  24  24    Calcium  8.9 - 10.3 mg/dL 9.3  9.0  8.9   Total Protein 6.5 - 8.1 g/dL 6.2  6.0  6.1   Total Bilirubin 0.0 - 1.2 mg/dL 1.0  1.0  1.1   Alkaline Phos 38 - 126 U/L 269  260  267   AST 15 - 41 U/L 21  19  26    ALT 0 - 44 U/L 15  17  18      CXR: No new CXR  Assessment/Plan: S/P Procedures (LRB): MVR on 12/5, protek placed on 12/6 and decannulated on 12/8 Wean dobutamine  per heart failure iHD per renal Temp wires removed 12/22 Continue aggressive PT PO  intake as tolerated, continue cycled tube feeds Remove foley today   LOS: 55 days    Con RAMAN Bettyjean Stefanski 02/26/2024  "

## 2024-02-26 NOTE — Progress Notes (Signed)
 Patient daughter, [name] called RN, requesting update.  Update given.  Daughter concerned that family wasn't called prior to transfer to [facility].    Daughter, [name], requesting MD and nephrology call her at  [phone] regarding patient status, plan of care.  Daughter also wants to be call prior to any procedures being done.

## 2024-02-26 NOTE — Progress Notes (Addendum)
 Patient ID: Tracy Baker, female   DOB: 08/31/1955, 68 y.o.   MRN: [id number]     Advanced Heart Failure Rounding Note  Cardiologist: Lonni Cash, MD  HF Cardiologist: Dr. Zenaida  Chief Complaint: S/p MV Repair / MV  endocarditis   Patient Profile  Tracy Baker is a 68 y.o. female with hx of recent nephrolithiasis s/p ureteral stent and lithotripsy at [facility] 10/10 with stent removal. 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 Post-op course c/b post cardiotomy shock, junctional bradycardia, afib w/ RVR and HAP.   Significant events:   12/2 RHC (on 0.125 of Milrinone ): RA 4, PA 42/13 (22), PCW 12 with Vwaves to 20, CO/CI TD 3.37/2.03, PAPi 7.5 12/5 S/P Redo MVR - Intra Op given  4UPRBCs, 2UPLTs, 2FFP, 1Cyro 12/6 Protek Duo cannulation 12/8 Decannulated.  Post-op echo showed EF 50%, RV mildly dilated and mildly dysfunctional, s/p bioprosthetic mitral valve (stable). 12/13 CVVH begun 12/15: extubated 12/16: CRRT stopped.  12/17: Started DBA wean 12/19: Failed lasix  challenge. iHD 12/22: TDC placed  Subjective:    Had HD yesterday, next session 12/24  DBA remains at 2. Co-ox 60%  CVP waveform flat.  Stronger today. Able to stand and transfer to chair.  Objective:    Weight Range: 71.3 kg Body mass index is 26.98 kg/m.   Vital Signs:   Temp:  [96.3 F (35.7 C)-98.6 F (37 C)] 97.9 F (36.6 C) (12/23 0731) Pulse Rate:  [50-185] 63 (12/23 0700) Resp:  [11-27] 16 (12/23 0700) BP: (89-147)/(46-99) 94/67 (12/23 0700) SpO2:  [89 %-100 %] 100 % (12/23 0700) Weight:  [71.3 kg] 71.3 kg (12/23 0441) Last BM Date : 02/25/24  Weight change: Filed Weights   02/24/24 0500 02/25/24 0500 02/26/24 0441  Weight: 73.9 kg 74.1 kg 71.3 kg   Intake/Output:  Intake/Output Summary (Last 24 hours) at 02/26/2024 0947 Last data filed at 02/26/2024 0400 Gross per 24 hour  Intake 1198.11 ml  Output 2515 ml  Net -1316.89 ml   Physical Exam    General:  Sitting up in chair.  Neck: Jvp 12 cm Cor: Irregular rhythm. no murmurs.  Lungs: clear anteriorly. Breathing nonlabored. Abdomen: soft, nontender, nondistended. Extremities: + UNNA Neuro: alert & orientedx3. Affect pleasant    Telemetry   Afib with controlled rates    Labs    CBC Recent Labs    02/25/24 0527 02/26/24 0423  WBC 5.8 6.3  HGB 9.9* 10.2*  HCT 31.1* 32.0*  MCV 100.0 100.9*  PLT 137* 153   Basic Metabolic Panel Recent Labs    87/77/74 0527 02/26/24 0423  NA 132* 135  K 5.1 4.8  CL 94* 95*  CO2 24 27  GLUCOSE 194* 146*  BUN 91* 58*  CREATININE 3.03* 2.45*  CALCIUM  9.0 9.3  MG 2.4 2.4  PHOS 4.5 4.2   Liver Function Tests Recent Labs    02/25/24 0527 02/26/24 0423  AST 19 21  ALT 17 15  ALKPHOS 260* 269*  BILITOT 1.0 1.0  PROT 6.0* 6.2*  ALBUMIN  3.4*  3.4* 3.7  3.6   Medications:    Scheduled Medications:  amiodarone   400 mg Per Tube BID   clonazePAM   0.5 mg Per Tube QHS   feeding supplement  237 mL Oral TID BM   feeding supplement (PROSource TF20)  60 mL Per Tube Daily   fiber supplement (BANATROL TF)  60 mL Per Tube BID   Gerhardt's butt cream   Topical BID   insulin   aspart  0-9 Units Subcutaneous Q4H   insulin  glargine  15 Units Subcutaneous Daily   mirtazapine   30 mg Per Tube QHS   mouth rinse  15 mL Mouth Rinse 4 times per day   pantoprazole   40 mg Oral Daily   QUEtiapine   25 mg Per Tube QHS   scopolamine   1 patch Transdermal Q72H   sertraline   25 mg Per Tube QHS   sodium chloride  flush  10-40 mL Intracatheter Q12H    Infusions:  anticoagulant sodium citrate      DOBUTamine  2 mcg/kg/min (02/26/24 0400)   feeding supplement (VITAL 1.5 CAL) 50 mL/hr at 02/26/24 0400   fluconazole  (DIFLUCAN ) IV Stopped (02/25/24 1307)   heparin  1,150 Units/hr (02/26/24 0606)   promethazine  (PHENERGAN ) injection (IM or IVPB) Stopped (02/24/24 2135)    PRN Medications: albuterol , alteplase , anticoagulant sodium citrate ,  heparin , lidocaine  (PF), lidocaine -prilocaine , mouth rinse, pentafluoroprop-tetrafluoroeth, polyethylene glycol, promethazine  (PHENERGAN ) injection (IM or IVPB), simethicone , sodium chloride  flush  Assessment/Plan   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 Kerrin for Candida endocarditis. Long-term fluconazole  per ID. Pre-op L/RHC 01/04/24 with no CAD and normal L/R heart pressures.  - Failed initial Milrinone  wean. Eventually weaned off, unfortunately restarted 12/1 for low output.  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 - Echo 12/19 EF 60% RV mildly HK  - Co-ox 61% on DBA 2. Would continue until fluid off with iHD - CVVH stopped 12/16. Failed lasix  challenge. Started iHD 12/19, tolerating well. Next session tomorrow - Volume slowly improving with iHD. Remains ~ 20 lb above pre-op weight.  Acute hypoxic respiratory failure: Suspect component of infection/aspiration PNA given unilateral disease.  - Respiratory culture- Enterobacter Completed cefepime  and vanc. - Extubated 12/15.  - stable on RA  Coagulopathy/thrombocytopenia/ABLA anemia:  - Slowly improving. - Suspect this is inflammatory due to critical illness.   Sick sinus syndrome - Initially with asystole underneath (as of 12/10) - Now with atrial fibrillation   Afib, persistent - Continue heparin  gtt, switch to eliquis  today? - Has received adequate amiodarone  load. Continue po amiodarone . No cardioversion for now with concerns for bradycardia.  - AF rate controlled  AKI on CKD 3b - Stopped CVVH 12/16. Failed lasix  challenge. iHD  12/19. Tolerated well. - plan as above  Protein-calorie malnutrition - On TF. Will discuss with RD. Adjust feeds as able to help promote more po intake - Remeron  to stimulate appetite  Pressure Ulcer R Buttock, DTI - Q2 turns, offload  Left radial artery occlusion - Left ulnar artery is patent.  - Continue heparin  gtt  Remains extremely weak, looks slightly better today. Needs ongoing aggressive PT/OT. Needs more progress with therapy for CIR admission.  COLLETTA SHAVER N PA-C 02/26/2024 9:47 AM   Patient seen and examined with the above-signed Advanced Practice Provider and/or Housestaff. I personally reviewed laboratory data, imaging studies and relevant notes. I independently examined the patient and formulated the important aspects of the plan. I have edited the note to reflect any of my changes or salient points. I have personally discussed the plan with the patient and/or family.  TDC placed yesterday. Tolerating iHD on low-dose DBA  Remains  weak. Poor po intake Weight coming down but still up about 20 pounds from pre-op   General:  Sitting up in bed. No resp difficulty HEENT: normal Neck: supple. JVP to jaw Cor: Irregular rate & rhythm. No rubs, gallops or murmurs. + HD cath Lungs: clear Abdomen: soft, nontender, nondistended.Good bowel sounds. Extremities: no cyanosis, clubbing, rash, 2+ edema Neuro: alert & orientedx3, cranial nerves grossly intact. moves all 4 extremities w/o difficulty. Affect pleasant  Remains volume overloaded. Now with TDC. Continue iHD with DBA support. Will ask Renal if we can add extra sessions to get her weight down.   Ok for [facility].   Toribio Fuel, MD  12:24 AM

## 2024-02-26 NOTE — Plan of Care (Signed)
" °  Problem: Education: Goal: Knowledge of General Education information will improve Description: Including pain rating scale, medication(s)/side effects and non-pharmacologic comfort measures Outcome: Progressing   Problem: Clinical Measurements: Goal: Ability to maintain clinical measurements within normal limits will improve Outcome: Progressing Goal: Will remain free from infection Outcome: Progressing Goal: Diagnostic test results will improve Outcome: Progressing Goal: Respiratory complications will improve Outcome: Progressing Goal: Cardiovascular complication will be avoided Outcome: Progressing   Problem: Coping: Goal: Level of anxiety will decrease Outcome: Progressing   Problem: Elimination: Goal: Will not experience complications related to bowel motility Outcome: Progressing Goal: Will not experience complications related to urinary retention Outcome: Progressing   Problem: Safety: Goal: Ability to remain free from injury will improve Outcome: Progressing   Problem: Skin Integrity: Goal: Risk for impaired skin integrity will decrease Outcome: Progressing   Problem: Education: Goal: Understanding of CV disease, CV risk reduction, and recovery process will improve Outcome: Progressing Goal: Individualized Educational Video(s) Outcome: Progressing   Problem: Education: Goal: Will demonstrate proper wound care and an understanding of methods to prevent future damage Outcome: Progressing Goal: Knowledge of disease or condition will improve Outcome: Progressing Goal: Knowledge of the prescribed therapeutic regimen will improve Outcome: Progressing Goal: Individualized Educational Video(s) Outcome: Progressing   "

## 2024-02-26 NOTE — Progress Notes (Signed)
 PHARMACY - ANTICOAGULATION CONSULT NOTE  Pharmacy Consult for heparin  Indication: atrial fibrillation  Allergies[1]  Patient Measurements: Height: 5' 4 (162.6 cm) Weight: 71.3 kg (157 lb 3 oz) IBW/kg (Calculated) : 54.7 HEPARIN  DW (KG): 66.5  Vital Signs: Temp: 98 F (36.7 C) (12/23 1105) Temp Source: Oral (12/23 1105) BP: 138/112 (12/23 1200) Pulse Rate: 67 (12/23 1200)  Labs: Recent Labs    02/24/24 0514 02/25/24 0527 02/26/24 0423  HGB 10.4* 9.9* 10.2*  HCT 32.7* 31.1* 32.0*  PLT 146* 137* 153  HEPARINUNFRC 0.29* 0.40 0.38  CREATININE 2.19* 3.03* 2.45*    Estimated Creatinine Clearance: 21.3 mL/min (A) (by C-G formula based on SCr of 2.45 mg/dL (H)).   Medical History: Past Medical History:  Diagnosis Date   Medical history non-contributory    Thyroid  disease    Vertigo     Medications:  Scheduled:   amiodarone   400 mg Per Tube BID   clonazePAM   0.5 mg Per Tube QHS   feeding supplement  237 mL Oral TID BM   feeding supplement (PROSource TF20)  60 mL Per Tube Daily   fiber supplement (BANATROL TF)  60 mL Per Tube BID   Gerhardt's butt cream   Topical BID   insulin  aspart  0-9 Units Subcutaneous Q4H   insulin  glargine  15 Units Subcutaneous Daily   mirtazapine   30 mg Per Tube QHS   mouth rinse  15 mL Mouth Rinse 4 times per day   pantoprazole   40 mg Oral Daily   QUEtiapine   25 mg Per Tube QHS   scopolamine   1 patch Transdermal Q72H   sertraline   25 mg Per Tube QHS   sodium chloride  flush  10-40 mL Intracatheter Q12H    Assessment: Ms. Verret is a 89 YOF now s/p MV repair and redo on 12/5. Hospitalization complicated by acute RV failure with cardiogenic shock s/p Protek duo VV ECMO cannula. Patient also with atrial fibrillation throughout this hospitalization.  Discussed with CCM/HF team and decision made to start low-dose heparin  infusion 12/14. Pharmacy consulted for heparin  dosing.   Heparin  level at goal today 0.38 on 1150 units/hr, CBC ok. No  bleeding issues.  Goal of Therapy:  Heparin  level 0.3-0.5 Monitor platelets by anticoagulation protocol: Yes   Plan:  Continue heparin  infusion at 1150 units/hr  Daily heparin  level, CBC F/u plans for Eliquis  eventually.  Harlene Barlow, [employer], BCCP Clinical Pharmacist  02/26/2024 12:29 PM   [facility] pharmacy phone numbers are listed on [email or web address]            [1]  Allergies Allergen Reactions   Codeine Anaphylaxis and Nausea And Vomiting   Bactoshield Chg [Chlorhexidine  Gluconate] Dermatitis   Chlorhexidine  Dermatitis and Rash   Red Dye #40 (Allura Red) Nausea Only

## 2024-02-26 NOTE — Progress Notes (Signed)
 "  NAME:  Tracy Baker, MRN:  [id number], DOB:  [birth year], LOS: 55 ADMISSION DATE:  12/28/2023, CONSULTATION DATE:  02/08/24 REFERRING MD:  Kerrin RAMAN., CHIEF COMPLAINT:  s/p MVR   History of Present Illness:  Tracy Baker is a 68 year old female with past medical history significant for nephrolithiasis s/p ureteral stent and lithotripsy at [facility] 10/10, with stent removal, severe MR, MV repair (01/10/24) due to MV Candida endocarditis c/b continued severe MR, who presents today for a redo sternotomy and MVR w/ Dr. Kerrin. PCCM consulted for ICU post-op vent management initially.  Pt ended up requiring Protek duo   Pertinent Medical History:  Severe MR Candida endocarditis Post-op Afib CKD 3b Significant Hospital Events: Including procedures, antibiotic start and stop dates in addition to other pertinent events   12/5: redo sternotomy, MVR Dr. Kerrin 12/6: Overnight patient started requiring more vasopressor support, early morning she was on dobutamine  5, milrinone  0.375, epinephrine  at 10, vasopressin  0.04 and norepinephrine  34 mics, she is not making urine.  Cannulated with Protek duo for RV support,  12/7 bronch, broadened antibiotics 12/8 decannulated from VV ECMO after sweep and flow wean trials. Lasix  stopped. Restarted TF. 12/10 chest tubes out, off pressors. Aline removed. 12/13 started CRRT 12/15 extubated, weak, continued fevers remains on CRRT 12/16 CRRT off this AM, less delirious, LE dopplers neg 12/17 off CRRT, trial diuresis  12/18 failed lasix  challenge, iHD 12/22 temp pacing wires removed, TDC placed.  Interim History / Subjective:  NAEO. Severely deconditioned will likely need SNF dialysis. TDC placed yesterday.  Objective    Blood pressure 94/67, pulse 63, temperature 97.9 F (36.6 C), temperature source Oral, resp. rate 16, height 5' 4 (1.626 m), weight 71.3 kg, SpO2 100%. CVP:  [63 mmHg] 63 mmHg      Intake/Output Summary (Last 24 hours) at  02/26/2024 1013 Last data filed at 02/26/2024 0400 Gross per 24 hour  Intake 1184.68 ml  Output 2515 ml  Net -1330.32 ml   Filed Weights   02/24/24 0500 02/25/24 0500 02/26/24 0441  Weight: 73.9 kg 74.1 kg 71.3 kg   General: acutely-ill elderly woman lying in bed, in NAD HEENT: AT/[place], PERRL, 3mm bilaterally Pulm: normal inspiratory and expiratory effort on RA, CTAB CV: Afib, midline incision approx with staples, c/d/I GI: soft, non distended, non tender to palpation Neuro: A&O x3, no focal deficits   Patient Lines/Drains/Airways Status     Active Line/Drains/Airways     Name Placement date Placement time Site Days   PICC Double Lumen 01/15/24 Right Basilic 35 cm 0 cm 01/15/24  8384  -- 42   Hemodialysis Catheter Left Internal jugular Double lumen Permanent (Tunneled) 02/25/24  1348  Internal jugular  1   Urethral Catheter Lauraine Holts, RN Temperature probe 14 Fr. 02/18/24  1422  Temperature probe  8   Small Bore Feeding Tube Left nare Marking at nare/corner of mouth 67 cm 02/15/24  1135  Left nare  11   Wound 01/16/24 0800 Traumatic Back Left;Upper 01/16/24  0800  Back  41   Wound 02/08/24 Surgical Closed Surgical Incision Sternum Mid 02/08/24  --  Sternum  18   Wound 02/12/24 1800 Pressure Injury Buttocks Right;Medial Stage 3 -  Full thickness tissue loss. Subcutaneous fat may be visible but bone, tendon or muscle are NOT exposed. 02/12/24  1800  Buttocks  14   Wound 02/13/24 1900 Irritant Contact Dermatitis Neck Right 02/13/24  1900  Neck  13   Wound 02/14/24 0800 Other (  Comment) Groin Bilateral 02/14/24  0800  Groin  12           Resolved Problem List:   Hypokalemia Ileus RUL pneumonia- Enterobacter; s/p course of cefepime  Acute blood loss anemia status post multiple PRBC transfusion Thrombocytopenia due to critical illness status post 2 unit platelets Coagulopathy, probably from liver congestion requiring FFP transfusion Hyperbilirubinemia  Assessment and Plan:    MV endocarditis s/p MV repair 01/10/2024, with persistent severe mitral regurgitation and status post redo sternotomy and bioprosthetic MVR Previous candidemia Acute RV failure with cardiogenic shock status post Protek duo VV ECMO cannula on 12/6, decannulated 12/8 - Prolonged course of fluconazole  per ID - Dobutamine  per HF team - Temp pacing wires removed 12/22  AKI on CKD stage IIIa - HD today - Remove foley 12/23; monitor with bladder scans  Hypoglycemia improved; now mild hyperglycemia on TF H/o prediabetes, A1c 6-most recently 5.4 - Continue SSI  Severe protein calorie malnutrition-  - Poor PO intake, on remeron   - Supplemental nutrition with tube feeds  Deconditioning, prolonged course of illness leading up to this  - PT/OT -Will likely need SNF w/ dialysis   Left radial artery occlusion with patent left ulnar artery - Heparin  infusion  Pressure ulcer on buttocks skin breakdown from bandage on R neck - Mobilize  Warren Shade, DNP, AGACNP-BC [facility]  Please see [email or web address] for pager details.  From 7A-7P if no response, please call [phone]. After hours, please call ELink [phone].  "

## 2024-02-26 NOTE — Plan of Care (Signed)
  Problem: Education: Goal: Knowledge of General Education information will improve Description: Including pain rating scale, medication(s)/side effects and non-pharmacologic comfort measures Outcome: Progressing   Problem: Health Behavior/Discharge Planning: Goal: Ability to manage health-related needs will improve Outcome: Progressing   Problem: Clinical Measurements: Goal: Ability to maintain clinical measurements within normal limits will improve Outcome: Progressing Goal: Respiratory complications will improve Outcome: Progressing   Problem: Activity: Goal: Risk for activity intolerance will decrease Outcome: Progressing   Problem: Pain Managment: Goal: General experience of comfort will improve and/or be controlled Outcome: Progressing

## 2024-02-27 DIAGNOSIS — B377 Candidal sepsis: Secondary | ICD-10-CM | POA: Diagnosis not present

## 2024-02-27 LAB — RENAL FUNCTION PANEL
Albumin: 3.5 g/dL (ref 3.5–5.0)
Anion gap: 15 (ref 5–15)
BUN: 86 mg/dL — ABNORMAL HIGH (ref 8–23)
CO2: 24 mmol/L (ref 22–32)
Calcium: 9.4 mg/dL (ref 8.9–10.3)
Chloride: 94 mmol/L — ABNORMAL LOW (ref 98–111)
Creatinine, Ser: 3.38 mg/dL — ABNORMAL HIGH (ref 0.44–1.00)
GFR, Estimated: 14 mL/min — ABNORMAL LOW
Glucose, Bld: 166 mg/dL — ABNORMAL HIGH (ref 70–99)
Phosphorus: 4.7 mg/dL — ABNORMAL HIGH (ref 2.5–4.6)
Potassium: 5.5 mmol/L — ABNORMAL HIGH (ref 3.5–5.1)
Sodium: 133 mmol/L — ABNORMAL LOW (ref 135–145)

## 2024-02-27 LAB — GLUCOSE, CAPILLARY
Glucose-Capillary: 172 mg/dL — ABNORMAL HIGH (ref 70–99)
Glucose-Capillary: 143 mg/dL — ABNORMAL HIGH (ref 70–99)
Glucose-Capillary: 166 mg/dL — ABNORMAL HIGH (ref 70–99)
Glucose-Capillary: 147 mg/dL — ABNORMAL HIGH (ref 70–99)

## 2024-02-27 LAB — CBC
HCT: 30.4 % — ABNORMAL LOW (ref 36.0–46.0)
Hemoglobin: 9.9 g/dL — ABNORMAL LOW (ref 12.0–15.0)
MCH: 31.8 pg (ref 26.0–34.0)
MCHC: 32.6 g/dL (ref 30.0–36.0)
MCV: 97.7 fL (ref 80.0–100.0)
Platelets: 148 K/uL — ABNORMAL LOW (ref 150–400)
RBC: 3.11 MIL/uL — ABNORMAL LOW (ref 3.87–5.11)
RDW: 19.7 % — ABNORMAL HIGH (ref 11.5–15.5)
WBC: 5.4 K/uL (ref 4.0–10.5)
nRBC: 0 % (ref 0.0–0.2)

## 2024-02-27 LAB — HEPATIC FUNCTION PANEL
ALT: 9 U/L (ref 0–44)
AST: 19 U/L (ref 15–41)
Albumin: 3.6 g/dL (ref 3.5–5.0)
Alkaline Phosphatase: 279 U/L — ABNORMAL HIGH (ref 38–126)
Bilirubin, Direct: 0.5 mg/dL — ABNORMAL HIGH (ref 0.0–0.2)
Indirect Bilirubin: 0.4 mg/dL (ref 0.3–0.9)
Total Bilirubin: 0.9 mg/dL (ref 0.0–1.2)
Total Protein: 6.3 g/dL — ABNORMAL LOW (ref 6.5–8.1)

## 2024-02-27 LAB — MAGNESIUM: Magnesium: 2.5 mg/dL — ABNORMAL HIGH (ref 1.7–2.4)

## 2024-02-27 LAB — COOXEMETRY PANEL
Carboxyhemoglobin: 2.2 % — ABNORMAL HIGH (ref 0.5–1.5)
Methemoglobin: <0.7 % (ref 0.0–1.5)
O2 Saturation: 61 %
Total hemoglobin: 10.6 g/dL — ABNORMAL LOW (ref 12.0–16.0)

## 2024-02-27 LAB — HEPARIN LEVEL (UNFRACTIONATED): Heparin Unfractionated: 0.36 [IU]/mL (ref 0.30–0.70)

## 2024-02-27 MED ORDER — HEPARIN SODIUM (PORCINE) 1000 UNIT/ML IJ SOLN
INTRAMUSCULAR | Status: AC
  Filled 2024-02-27: qty 5

## 2024-02-27 MED ORDER — ACETAMINOPHEN 500 MG PO TABS: 1000.0000 mg | ORAL_TABLET | Freq: Four times a day (QID) | ORAL | Status: DC | PRN

## 2024-02-27 MED ORDER — AMIODARONE HCL 200 MG PO TABS
200.0000 mg | ORAL_TABLET | Freq: Two times a day (BID) | ORAL | Status: DC
  Administered 2024-02-27 – 2024-03-04 (×13): 200 mg
  Filled 2024-02-27 (×5): qty 1
  Filled 2024-02-27 (×4): qty 2
  Filled 2024-02-27 (×2): qty 1

## 2024-02-27 MED ORDER — TRAMADOL HCL 50 MG PO TABS: 50.0000 mg | ORAL_TABLET | Freq: Two times a day (BID) | ORAL | Status: DC | PRN

## 2024-02-27 NOTE — Progress Notes (Signed)
 " [facility] Progress Note    Assessment/ Plan:   AKI -b/l Cr 0.4-0.8. 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 On antifungal therapy -on dobuatmine-- wean as able  AHRF -extubated on 12/15 -currently stable -UF as tolerated with HD  Anemia -transfuse PRN for Hgb <7 -consider esa  Hyponatremia: mild and intermittent.  Manage volume with dialysis  Hyperkalemia: 2/2 ARF. Mgmt w/ HD as above  Given the patient's stability and long-term hospital stay we will not plan to round on her on 12/25.  We will be available if any acute concern comes up.    FYI -- Holiday Schedule for next week Usual TTS schedule -> Mon (22), Wed (24), Sat (27)  Subjective:   Patient tired but no complaints.   Objective:   BP 127/77 (BP Location: Left Arm)   Pulse 80   Temp 98.1 F (36.7 C) (Oral)   Resp 15   Ht 5' 4 (1.626 m)   Wt 73.5 kg   SpO2 95%   BMI 27.81 kg/m   Intake/Output Summary (Last 24 hours) at 02/27/2024 9271 Last data filed at 02/27/2024 0602 Gross per 24 hour  Intake 1910.97 ml  Output 20 ml  Net 1890.97 ml   Weight change: 2.2 kg  Physical Exam: Gen: ill/tired appearing, NAD CVS: normal rate, no rub Resp: bl chest rise, normal wob Abd: soft Ext: trace edema at the bilateral ankles Neuro: awake, alert, drowsy Dialysis access: LIJ temp HD cath  Imaging: IR TUNNELED CENTRAL VENOUS CATH [facility] W IMG Result Date: 02/25/2024 INDICATION: Renal failure requiring prolonged hemodialysis. EXAM: TUNNELED hemodialysis catheter placement ULTRASOUND AND FLUOROSCOPIC GUIDANCE MEDICATIONS: 2 g Ancef  IV. The antibiotic was given in an appropriate time interval prior to skin puncture.  ANESTHESIA/SEDATION: Moderate (conscious) sedation was employed during this procedure. A total of Versed  1.5 mg and Fentanyl  25 mcg was administered intravenously by the radiology nurse. Total intra-service moderate Sedation Time: 17 minutes. The patient's level of consciousness and vital signs were monitored continuously by radiology nursing throughout the procedure under my direct supervision. FLUOROSCOPY: Radiation Exposure Index (as provided by the fluoroscopic device): 10 mGy Kerma COMPLICATIONS: None immediate. PROCEDURE: Informed written consent was obtained from the patient after a discussion of the risks, benefits, and alternatives to treatment. Questions regarding the procedure were encouraged and answered. The left neck and chest were prepped with chlorhexidine  in a sterile fashion, and a sterile drape was applied covering the operative field. Maximum barrier sterile technique with sterile gowns and gloves were used for the procedure. A timeout was performed prior to the initiation of the procedure. After creating a small venotomy incision, a micropuncture kit was utilized to access the left internal jugular vein under direct, real-time ultrasound guidance after the overlying soft tissues were anesthetized with 1% lidocaine  with epinephrine . Ultrasound image documentation was performed. The microwire was kinked to measure appropriate catheter length. The micropuncture sheath was exchanged for a peel-away sheath over a guidewire. A 28 cm tunneled hemodialysis palindrome catheter was tunneled in a retrograde fashion from the anterior chest wall to the venotomy incision. The catheter was then placed through the peel-away sheath with tip ultimately positioned at the superior caval-atrial junction. Final catheter positioning  was confirmed and documented with a spot radiographic image. The catheter aspirates and flushes normally. The catheter was flushed with appropriate volume heparin  dwells. The catheter exit  site was secured with a 0-Prolene retention suture. The venotomy incision was closed withDermabond and Steri-strips. Dressings were applied. The patient tolerated the procedure well without immediate post procedural complication. FINDINGS: After catheter placement, the tip lies within the superior cavoatrial junction. The catheter aspirates and flushes normally and is ready for immediate use. IMPRESSION: Successful placement of 28 cm tunneled hemodialysis palindrome catheter via the left internal jugular vein with tip terminating at the superior caval atrial junction. The catheter is ready for immediate use. Electronically Signed   By: Cordella Banner   On: 02/25/2024 14:58      Labs: BMET Recent Labs  Lab 02/21/24 1627 02/22/24 9547 02/23/24 0455 02/24/24 0514 02/25/24 0527 02/26/24 0423 02/27/24 0500  NA 134* 134* 133* 133* 132* 135 133*  K 4.9 4.0 4.5 4.7 5.1 4.8 5.5*  CL 97* 95* 95* 94* 94* 95* 94*  CO2 23 27 24 24 24 27 24   GLUCOSE 137* 124* 174* 195* 194* 146* 166*  BUN 75* 47* 81* 61* 91* 58* 86*  CREATININE 2.35* 1.79* 2.64* 2.19* 3.03* 2.45* 3.38*  CALCIUM  9.1 9.1 9.0 8.9 9.0 9.3 9.4  PHOS 5.1* 4.0 5.1* 3.8 4.5 4.2 4.7*   CBC Recent Labs  Lab 02/24/24 0514 02/25/24 0527 02/26/24 0423 02/27/24 0500  WBC 7.0 5.8 6.3 5.4  HGB 10.4* 9.9* 10.2* 9.9*  HCT 32.7* 31.1* 32.0* 30.4*  MCV 98.5 100.0 100.9* 97.7  PLT 146* 137* 153 148*    Medications:     amiodarone   400 mg Per Tube BID   clonazePAM   0.5 mg Per Tube QHS   feeding supplement  237 mL Oral TID BM   feeding supplement (PROSource TF20)  60 mL Per Tube Daily   fiber supplement (BANATROL TF)  60 mL Per Tube BID   Gerhardt's butt cream   Topical BID   insulin  aspart  0-9 Units Subcutaneous Q4H   insulin  glargine  15 Units Subcutaneous Daily   mirtazapine   30 mg Per Tube QHS   mouth rinse  15 mL Mouth Rinse 4 times per day   pantoprazole   40 mg Oral Daily   QUEtiapine   25 mg Per Tube QHS   scopolamine   1  patch Transdermal Q72H   sertraline   25 mg Per Tube QHS   sodium chloride  flush  10-40 mL Intracatheter Q12H       "

## 2024-02-27 NOTE — Progress Notes (Signed)
 Physical Therapy Treatment Patient Details Name: Tracy Baker MRN: [id number] DOB: 01-20-1956 Today's Date: 02/27/2024   History of Present Illness 68 y.o. female adm 12/29/2023 after leaving AMA from [facility] (where she was on vacation due to lack of formal transfer) where she was being treated for C. Albicans candidemia. 10/28 TEE with mitral valve vegetation. 11/3 teeth Extractions. 11/6 Mitral valve repair. 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 Extubated 12/15. PMHx: thyroid  disease.    PT Comments  Pt agreeable to limited session due to fatigue after HD treatment earlier today. The pt was able to complete bed mobility with improved initiation in LE and improved sitting balance at EOB. The pt continues to demos significant limitations in LE strength and muscular endurance, tolerating only 2 sets of LE exercises prior to initiating return to supine. VSS throughout. Will continue efforts acutely, pt is making good progress with gait on non-HD days, will continue efforts to progress strength and endurance, still recommend intensive therapies after d/c.     If plan is discharge home, recommend the following: Assistance with cooking/housework;Assist for transportation;Help with stairs or ramp for entrance;Two people to help with walking and/or transfers;Two people to help with bathing/dressing/bathroom;Assistance with feeding;Direct supervision/assist for medications management;Direct supervision/assist for financial management   Can travel by private vehicle        Equipment Recommendations  Other (comment) (TBD)    Recommendations for Other Services       Precautions / Restrictions Precautions Precautions: Fall;Sternal Recall of Precautions/Restrictions: Impaired Precaution/Restrictions Comments: cortrak, foley Restrictions Weight Bearing Restrictions Per Provider Order: No Other Position/Activity Restrictions:  Cardiac sternal precautions     Mobility  Bed Mobility Overal bed mobility: Needs Assistance Bed Mobility: Rolling, Sidelying to Sit, Sit to Sidelying Rolling: Mod assist Sidelying to sit: Max assist     Sit to sidelying: Mod assist General bed mobility comments: with increased time and tactile cues, pt able to complete more movement with LE, dependent on assist at trunk    Transfers                   General transfer comment: pt declined, returning to supine after 2 sets of LE exercises and declined back to EOB    Ambulation/Gait                   Stairs             Wheelchair Mobility     Tilt Bed    Modified Rankin (Stroke Patients Only)       Balance Overall balance assessment: Needs assistance Sitting-balance support: No upper extremity supported, Feet unsupported Sitting balance-Leahy Scale: Poor Sitting balance - Comments: CGA to minA to maintain sitting balance, mild posterior lean                                    Communication Communication Communication: Impaired Factors Affecting Communication:  (soft spoken)  Cognition Arousal: Alert Behavior During Therapy: Flat affect   PT - Cognitive impairments: Awareness, Attention, Memory, Initiation, Sequencing, Problem solving, Safety/Judgement                       PT - Cognition Comments: continues to be delayed in her response to questions, able to recall sternal precautions, continues to have poor initiation and sequencing, poor motivation, requires significant cues Following  commands: Impaired Following commands impaired: Follows one step commands inconsistently, Follows one step commands with increased time    Cueing Cueing Techniques: Tactile cues, Gestural cues, Verbal cues  Exercises General Exercises - Lower Extremity Ankle Circles/Pumps: AROM, Both, 5 reps Short Arc Quad:  (partial ROM) Long Arc Quad: AROM, Both, Seated, 5 reps Heel Raises:  AROM, Both, 15 reps, Seated    General Comments        Pertinent Vitals/Pain Pain Assessment Pain Assessment: No/denies pain    Home Living                          Prior Function            PT Goals (current goals can now be found in the care plan section) Acute Rehab PT Goals Patient Stated Goal: agreeable to session, did not state a goal PT Goal Formulation: With patient Time For Goal Achievement: 03/12/24 Potential to Achieve Goals: Fair Progress towards PT goals: Progressing toward goals    Frequency    Min 3X/week       AM-PAC PT 6 Clicks Mobility   Outcome Measure  Help needed turning from your back to your side while in a flat bed without using bedrails?: A Lot Help needed moving from lying on your back to sitting on the side of a flat bed without using bedrails?: Total Help needed moving to and from a bed to a chair (including a wheelchair)?: Total Help needed standing up from a chair using your arms (e.g., wheelchair or bedside chair)?: Total Help needed to walk in hospital room?: Total Help needed climbing 3-5 steps with a railing? : Total 6 Click Score: 7    End of Session Equipment Utilized During Treatment: Gait belt Activity Tolerance: Patient limited by fatigue;Patient tolerated treatment well Patient left: with call bell/phone within reach;in bed;with bed alarm set Nurse Communication: Mobility status PT Visit Diagnosis: Other abnormalities of gait and mobility (R26.89);Muscle weakness (generalized) (M62.81);Difficulty in walking, not elsewhere classified (R26.2);Unsteadiness on feet (R26.81)     Time: 1440-1459 PT Time Calculation (min) (ACUTE ONLY): 19 min  Charges:    $Therapeutic Exercise: 8-22 mins PT General Charges $$ ACUTE PT VISIT: 1 Visit                     Izetta Call, PT, DPT   Acute Rehabilitation Department Office [phone] Secure Chat Communication Preferred   Izetta JULIANNA Call 02/27/2024, 3:36  PM

## 2024-02-27 NOTE — Progress Notes (Signed)
 Patient daughter, [name], states patient has nausea with klonopine and pain medications and does not want patient to have at this time due to patient continued nausea.

## 2024-02-27 NOTE — Progress Notes (Addendum)
 "     Advanced Heart Failure Rounding Note  Cardiologist: Lonni Cash, MD  HF Cardiologist: Dr. Zenaida Chief Complaint: S/p MV Repair / MV endocarditis  Patient Profile   Tracy Baker is a 68 y.o. female with hx of recent nephrolithiasis s/p ureteral stent and lithotripsy at [facility] 10/10 with stent removal. 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 Post-op course c/b post cardiotomy shock, junctional bradycardia, afib w/ RVR and HAP.   Significant events:    12/2 RHC (on 0.125 of Milrinone ): RA 4, PA 42/13 (22), PCW 12 with Vwaves to 20, CO/CI TD 3.37/2.03, PAPi 7.5 12/5 S/P Redo MVR - Intra Op given  4UPRBCs, 2UPLTs, 2FFP, 1Cyro 12/6 Protek Duo cannulation 12/8 Decannulated.  Post-op echo showed EF 50%, RV mildly dilated and mildly dysfunctional, s/p bioprosthetic mitral valve (stable). 12/13 CVVH begun 12/15: extubated 12/16: CRRT stopped.  12/17: Started DBA wean 12/19: Failed lasix  challenge. iHD 12/22: TDC placed  Subjective:    DBA remains at 2. Co-ox 60%  20cc UOP yesterday. HD today. K 5.5  Lying in bed, lethargic.  Objective:    Weight Range: 73.5 kg Body mass index is 27.81 kg/m.   Vital Signs:   Temp:  [97.5 F (36.4 C)-98.1 F (36.7 C)] 98.1 F (36.7 C) (12/24 0300) Pulse Rate:  [59-81] 80 (12/24 0500) Resp:  [13-20] 15 (12/24 0500) BP: (94-140)/(56-112) 127/77 (12/24 0500) SpO2:  [91 %-100 %] 95 % (12/24 0500) Weight:  [73.5 kg] 73.5 kg (12/24 0300) Last BM Date : 02/26/24  Weight change: Filed Weights   02/25/24 0500 02/26/24 0441 02/27/24 0300  Weight: 74.1 kg 71.3 kg 73.5 kg   Intake/Output:  Intake/Output Summary (Last 24 hours) at 02/27/2024 0659 Last data filed at 02/27/2024 0602 Gross per 24 hour  Intake 1910.97 ml  Output 20 ml  Net 1890.97 ml   Physical Exam   General: Ill appearing. No distress  Cardiac: JVP ~10cm. No murmurs  Extremities: Warm and dry.  3+ edema. +UNNA  Telemetry    Af 60s (personally reviewed)  Labs    CBC Recent Labs    02/26/24 0423 02/27/24 0500  WBC 6.3 5.4  HGB 10.2* 9.9*  HCT 32.0* 30.4*  MCV 100.9* 97.7  PLT 153 148*   Basic Metabolic Panel Recent Labs    87/76/74 0423 02/27/24 0500  NA 135 133*  K 4.8 5.5*  CL 95* 94*  CO2 27 24  GLUCOSE 146* 166*  BUN 58* 86*  CREATININE 2.45* 3.38*  CALCIUM  9.3 9.4  MG 2.4 2.5*  PHOS 4.2 4.7*   Liver Function Tests Recent Labs    02/26/24 0423 02/27/24 0500  AST 21 19  ALT 15 9  ALKPHOS 269* 279*  BILITOT 1.0 0.9  PROT 6.2* 6.3*  ALBUMIN  3.7  3.6 3.6  3.5   Medications:    Scheduled Medications:  amiodarone   400 mg Per Tube BID   clonazePAM   0.5 mg Per Tube QHS   feeding supplement  237 mL Oral TID BM   feeding supplement (PROSource TF20)  60 mL Per Tube Daily   fiber supplement (BANATROL TF)  60 mL Per Tube BID   Gerhardt's butt cream   Topical BID   insulin  aspart  0-9 Units Subcutaneous Q4H   insulin  glargine  15 Units Subcutaneous Daily   mirtazapine   30 mg Per Tube QHS   mouth rinse  15 mL Mouth Rinse 4 times per day   pantoprazole   40  mg Oral Daily   QUEtiapine   25 mg Per Tube QHS   scopolamine   1 patch Transdermal Q72H   sertraline   25 mg Per Tube QHS   sodium chloride  flush  10-40 mL Intracatheter Q12H    Infusions:  anticoagulant sodium citrate      DOBUTamine  2 mcg/kg/min (02/27/24 0602)   feeding supplement (VITAL 1.5 CAL) 50 mL/hr at 02/27/24 0602   fluconazole  (DIFLUCAN ) IV Stopped (02/26/24 1233)   heparin  1,150 Units/hr (02/27/24 0602)   promethazine  (PHENERGAN ) injection (IM or IVPB) Stopped (02/24/24 2135)    PRN Medications: albuterol , alteplase , anticoagulant sodium citrate , heparin , lidocaine  (PF), lidocaine -prilocaine , mouth rinse, pentafluoroprop-tetrafluoroeth, polyethylene glycol, promethazine  (PHENERGAN ) injection (IM or IVPB), simethicone , sodium chloride  flush  Assessment/Plan   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 Kerrin for candida endocarditis. Long-term fluconazole  per ID.  - Pre-op L/RHC 01/04/24 with no CAD and normal L/R heart pressures.  - Failed initial Milrinone  wean. Eventually weaned off, unfortunately restarted 12/1 for low output.  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 - Echo 12/19 EF 60% RV mildly HK  - Co-ox 60% on DBA 2. Would continue until fluid off with iHD - CVVH stopped 12/16. Failed lasix  challenge. Started iHD 12/19, tolerating well. Next session 12/24. - Volume remains up on iHD. Continue today. Remains ~ 20 lb above pre-op weight.  Acute hypoxic respiratory failure: Suspect component of infection/aspiration PNA given unilateral disease.  - Respiratory culture- Enterobacter Completed cefepime  and vanc. - stable on RA  Coagulopathy/thrombocytopenia/ABLA anemia:  - Slowly improving. - Suspect this is inflammatory due to critical illness.   Sick sinus syndrome - Initially with asystole underneath (as of 12/10) - Now with atrial fibrillation   Afib, persistent - continue heparin  gtt; until all procedures completed. May need PEG tube, per TCTS - completed amio load, decrease to 200 mg bid. No cardioversion for now with concerns for bradycardia.  - rate controlled  AKI on CKD 3b - Stopped CVVH 12/16. Failed lasix  challenge. iHD 12/19. Tolerated well. - plan as above  Protein-calorie malnutrition - On TF. Will discuss with RD. Adjust feeds as able to help promote more po intake - Remeron  to stimulate appetite - possible PEG tube per sx  Pressure Ulcer R Buttock, DTI -  Q2 turns, offload  Left radial artery occlusion - Left ulnar artery is patent.  - Continue heparin  gtt  Remains extremely weak, looks slightly better today. Needs ongoing aggressive PT/OT. Needs more progress with therapy for CIR admission.  Jordan Lee, NP 6:59 AM  Advanced Heart Failure Team Pager [phone] (M-F; 7a - 5p)    Please visit [email or web address]: For overnight coverage please call cardiology fellow first. If fellow not available call Shock/ECMO MD on call.  For ECMO / Mechanical Support (Impella, IABP, LVAD) issues call Shock / ECMO MD on call.    Patient seen and examined with the above-signed Advanced Practice Provider and/or Housestaff. I personally reviewed laboratory data, imaging studies and relevant notes. I independently examined the patient and formulated the important aspects of the plan. I have edited the note to reflect any of my changes or salient points. I have personally  discussed the plan with the patient and/or family.  Feels weak. Remains on DBA. Tolerating HD  General:  Sitting up in bed. No resp difficulty HEENT: normal Neck: supple. JVP up   Cor: Irregular rate & rhythm. +TDC Lungs: clear Abdomen: soft, nontender, nondistended.Good bowel sounds. Extremities: no cyanosis, clubbing, rash, 2-3+ edema + UNNA Neuro: alert & orientedx3, cranial nerves grossly intact. moves all 4 extremities w/o difficulty. Affect flat  Remains volume overloaded. Continue to pull fluid with iHD> WIll continue DBA until euvolemic to support full HD. Will see again on Friday. Call with questions.   Toribio Fuel, MD  5:25 PM     "

## 2024-02-27 NOTE — Progress Notes (Signed)
 PHARMACY - ANTICOAGULATION CONSULT NOTE  Pharmacy Consult for heparin  Indication: atrial fibrillation  Allergies[1]  Patient Measurements: Height: 5' 4 (162.6 cm) Weight: 77.1 kg (169 lb 15.6 oz) IBW/kg (Calculated) : 54.7 HEPARIN  DW (KG): 66.5  Vital Signs: Temp: 97.4 F (36.3 C) (12/24 0738) Temp Source: Oral (12/24 0300) BP: 125/67 (12/24 0745) Pulse Rate: 72 (12/24 0745)  Labs: Recent Labs    02/25/24 0527 02/26/24 0423 02/27/24 0500  HGB 9.9* 10.2* 9.9*  HCT 31.1* 32.0* 30.4*  PLT 137* 153 148*  HEPARINUNFRC 0.40 0.38 0.36  CREATININE 3.03* 2.45* 3.38*    Estimated Creatinine Clearance: 16 mL/min (A) (by C-G formula based on SCr of 3.38 mg/dL (H)).   Medical History: Past Medical History:  Diagnosis Date   Medical history non-contributory    Thyroid  disease    Vertigo     Medications:  Scheduled:   amiodarone   200 mg Per Tube BID   clonazePAM   0.5 mg Per Tube QHS   feeding supplement  237 mL Oral TID BM   feeding supplement (PROSource TF20)  60 mL Per Tube Daily   fiber supplement (BANATROL TF)  60 mL Per Tube BID   Gerhardt's butt cream   Topical BID   insulin  aspart  0-9 Units Subcutaneous Q4H   insulin  glargine  15 Units Subcutaneous Daily   mirtazapine   30 mg Per Tube QHS   mouth rinse  15 mL Mouth Rinse 4 times per day   pantoprazole   40 mg Oral Daily   QUEtiapine   25 mg Per Tube QHS   scopolamine   1 patch Transdermal Q72H   sertraline   25 mg Per Tube QHS   sodium chloride  flush  10-40 mL Intracatheter Q12H    Assessment: Tracy Baker is a 8 YOF now s/p MV repair and redo on 12/5. Hospitalization complicated by acute RV failure with cardiogenic shock s/p Protek duo VV ECMO cannula. Patient also with atrial fibrillation throughout this hospitalization.  Discussed with CCM/HF team and decision made to start low-dose heparin  infusion 12/14. Pharmacy consulted for heparin  dosing.   Heparin  level at goal today 0.36 on 1150 units/hr. Hgb (9.9),  PLT (148) stable. Goal of Therapy:  Heparin  level 0.3-0.5 Monitor platelets by anticoagulation protocol: Yes   Plan:  Continue heparin  infusion at 1150 units/hr  Daily heparin  level, CBC F/u plans for Eliquis  eventually  Izetta Carl, PharmD PGY1 Pharmacy Resident              [1]  Allergies Allergen Reactions   Codeine Anaphylaxis and Nausea And Vomiting   Bactoshield Chg [Chlorhexidine  Gluconate] Dermatitis   Chlorhexidine  Dermatitis and Rash   Red Dye #40 (Allura Red) Nausea Only

## 2024-02-27 NOTE — Plan of Care (Signed)

## 2024-02-27 NOTE — Progress Notes (Signed)
" °   02/27/24 1124  Vitals  BP 108/67  MAP (mmHg) 81  BP Location Left Arm  BP Method Automatic  Patient Position (if appropriate) Lying  Pulse Rate (!) 103  ECG Heart Rate (!) 103  Resp 16  Oxygen Therapy  SpO2 96 %  O2 Device Room Air  Pulse Oximetry Type Continuous  During Treatment Monitoring  Blood Flow Rate (mL/min) 0 mL/min  Arterial Pressure (mmHg) -499.98 mmHg  Venous Pressure (mmHg) 55.15 mmHg  TMP (mmHg) 17.77 mmHg  Ultrafiltration Rate (mL/min) 1117 mL/min  Dialysate Flow Rate (mL/min) 299 ml/min  Dialysate Potassium Concentration 2  Dialysate Calcium  Concentration 2.5  Duration of HD Treatment -hour(s) 3.49 hour(s)  Cumulative Fluid Removed (mL) per Treatment  2993.77  HD Safety Checks Performed Yes  Intra-Hemodialysis Comments Tolerated well  Post Treatment  Dialyzer Clearance Clear  Liters Processed 73.4  Fluid Removed (mL) 3000 mL  Tolerated HD Treatment Yes  Hemodialysis Catheter Left Internal jugular Double lumen Permanent (Tunneled)  Placement Date/Time: 02/25/24 1348   Serial / Lot #: [id number]  Expiration Date: 08/03/28  Time Out: Correct patient;Correct site;Correct procedure  Maximum sterile barrier precautions: Hand hygiene;Cap;Mask;Sterile gown;Sterile gloves;Large sterile s...  Site Condition No complications  Blue Lumen Status Antimicrobial dead end cap;Heparin  locked  Red Lumen Status Antimicrobial dead end cap;Heparin  locked  Catheter fill solution Heparin  1000 units/ml  Catheter fill volume (Arterial) 2.1 cc  Catheter fill volume (Venous) 2.1  Dressing Type Transparent  Dressing Status Clean, Dry, Intact;Antimicrobial disc/dressing in place  Interventions New dressing;Dressing changed;Antimicrobial disc changed  Drainage Description None  Dressing Change Due 03/05/24  Post treatment catheter status Capped and Clamped    "

## 2024-02-27 NOTE — Plan of Care (Signed)
 " Problem: Education: Goal: Knowledge of General Education information will improve Description: Including pain rating scale, medication(s)/side effects and non-pharmacologic comfort measures 02/27/2024 2309 by Lilian Glenys SAUNDERS, RN Outcome: Progressing 02/27/2024 2309 by Lilian Glenys SAUNDERS, RN Outcome: Progressing   Problem: Health Behavior/Discharge Planning: Goal: Ability to manage health-related needs will improve Outcome: Progressing   Problem: Clinical Measurements: Goal: Ability to maintain clinical measurements within normal limits will improve 02/27/2024 2309 by Jett Kulzer, Glenys SAUNDERS, RN Outcome: Progressing 02/27/2024 2309 by Lilian Glenys SAUNDERS, RN Outcome: Progressing Goal: Will remain free from infection 02/27/2024 2309 by Lilian Glenys SAUNDERS, RN Outcome: Progressing 02/27/2024 2309 by Lilian Glenys SAUNDERS, RN Outcome: Progressing Goal: Diagnostic test results will improve 02/27/2024 2309 by Lilian Glenys SAUNDERS, RN Outcome: Progressing 02/27/2024 2309 by Lilian Glenys SAUNDERS, RN Outcome: Progressing Goal: Respiratory complications will improve 02/27/2024 2309 by Lilian Glenys SAUNDERS, RN Outcome: Progressing 02/27/2024 2309 by Lilian Glenys SAUNDERS, RN Outcome: Progressing Goal: Cardiovascular complication will be avoided 02/27/2024 2309 by Lilian Glenys SAUNDERS, RN Outcome: Progressing 02/27/2024 2309 by Lilian Glenys SAUNDERS, RN Outcome: Progressing   Problem: Activity: Goal: Risk for activity intolerance will decrease Outcome: Progressing   Problem: Nutrition: Goal: Adequate nutrition will be maintained 02/27/2024 2309 by Lilian Glenys SAUNDERS, RN Outcome: Progressing 02/27/2024 2309 by Lilian Glenys SAUNDERS, RN Outcome: Progressing   Problem: Coping: Goal: Level of anxiety will decrease 02/27/2024 2309 by Lilian Glenys SAUNDERS, RN Outcome: Progressing 02/27/2024 2309 by Lilian Glenys SAUNDERS, RN Outcome: Progressing   Problem: Elimination: Goal: Will not experience  complications related to bowel motility 02/27/2024 2309 by Lilian Glenys SAUNDERS, RN Outcome: Progressing 02/27/2024 2309 by Lilian Glenys SAUNDERS, RN Outcome: Progressing Goal: Will not experience complications related to urinary retention 02/27/2024 2309 by Sivan Cuello, Glenys SAUNDERS, RN Outcome: Progressing 02/27/2024 2309 by Lilian Glenys SAUNDERS, RN Outcome: Progressing   Problem: Pain Managment: Goal: General experience of comfort will improve and/or be controlled 02/27/2024 2309 by Armanii Urbanik, Glenys SAUNDERS, RN Outcome: Progressing 02/27/2024 2309 by Lilian Glenys SAUNDERS, RN Outcome: Progressing   Problem: Safety: Goal: Ability to remain free from injury will improve 02/27/2024 2309 by Athalene Kolle, Glenys SAUNDERS, RN Outcome: Progressing 02/27/2024 2309 by Lilian Glenys SAUNDERS, RN Outcome: Progressing   Problem: Skin Integrity: Goal: Risk for impaired skin integrity will decrease 02/27/2024 2309 by Katryna Tschirhart, Glenys SAUNDERS, RN Outcome: Progressing 02/27/2024 2309 by Lilian Glenys SAUNDERS, RN Outcome: Progressing   Problem: Education: Goal: Understanding of CV disease, CV risk reduction, and recovery process will improve Outcome: Progressing Goal: Individualized Educational Video(s) Outcome: Progressing   Problem: Activity: Goal: Ability to return to baseline activity level will improve Outcome: Progressing   Problem: Cardiovascular: Goal: Ability to achieve and maintain adequate cardiovascular perfusion will improve 02/27/2024 2309 by Calaya Gildner, Glenys SAUNDERS, RN Outcome: Progressing 02/27/2024 2309 by Lilian Glenys SAUNDERS, RN Outcome: Progressing Goal: Vascular access site(s) Level 0-1 will be maintained 02/27/2024 2309 by Lilia Letterman, Glenys SAUNDERS, RN Outcome: Progressing 02/27/2024 2309 by Lilian Glenys SAUNDERS, RN Outcome: Progressing   Problem: Health Behavior/Discharge Planning: Goal: Ability to safely manage health-related needs after discharge will improve Outcome: Progressing   Problem: Education: Goal:  Will demonstrate proper wound care and an understanding of methods to prevent future damage Outcome: Progressing Goal: Knowledge of disease or condition will improve Outcome: Progressing Goal: Knowledge of the prescribed therapeutic regimen will improve Outcome: Progressing Goal: Individualized Educational Video(s) Outcome: Progressing   Problem: Activity: Goal: Risk for activity intolerance will decrease Outcome: Progressing   Problem: Cardiac: Goal: Will achieve and/or maintain hemodynamic  stability 02/27/2024 2309 by Lilian Glenys SAUNDERS, RN Outcome: Progressing 02/27/2024 2309 by Lilian Glenys SAUNDERS, RN Outcome: Progressing   Problem: Clinical Measurements: Goal: Postoperative complications will be avoided or minimized Outcome: Progressing   Problem: Respiratory: Goal: Respiratory status will improve 02/27/2024 2309 by Assia Meanor, Glenys SAUNDERS, RN Outcome: Progressing 02/27/2024 2309 by Lilian Glenys SAUNDERS, RN Outcome: Progressing   Problem: Skin Integrity: Goal: Wound healing without signs and symptoms of infection Outcome: Progressing Goal: Risk for impaired skin integrity will decrease Outcome: Progressing   Problem: Urinary Elimination: Goal: Ability to achieve and maintain adequate renal perfusion and functioning will improve Outcome: Progressing   Problem: Education: Goal: Ability to describe self-care measures that may prevent or decrease complications (Diabetes Survival Skills Education) will improve Outcome: Progressing Goal: Individualized Educational Video(s) Outcome: Progressing   Problem: Coping: Goal: Ability to adjust to condition or change in health will improve Outcome: Progressing   Problem: Fluid Volume: Goal: Ability to maintain a balanced intake and output will improve Outcome: Progressing   Problem: Health Behavior/Discharge Planning: Goal: Ability to identify and utilize available resources and services will improve Outcome: Progressing Goal:  Ability to manage health-related needs will improve Outcome: Progressing   Problem: Metabolic: Goal: Ability to maintain appropriate glucose levels will improve Outcome: Progressing   Problem: Nutritional: Goal: Maintenance of adequate nutrition will improve Outcome: Progressing Goal: Progress toward achieving an optimal weight will improve Outcome: Progressing   "

## 2024-02-27 NOTE — Progress Notes (Signed)
 "     [address] Zone [place] [address]             [phone]      18 Days Post-Op Procedures (LRB): ECMO CANNULATION (N/A) Subjective: Patient in HD lethargic, sleeping upon arrival. Nodded and shook her head to questions but did not speak.   Objective: Vital signs in last 24 hours: Temp:  [97.4 F (36.3 C)-98.1 F (36.7 C)] 97.4 F (36.3 C) (12/24 0738) Pulse Rate:  [60-90] 90 (12/24 0830) Cardiac Rhythm: Atrial fibrillation (12/24 0738) Resp:  [12-20] 12 (12/24 0830) BP: (104-140)/(56-112) 108/58 (12/24 0830) SpO2:  [91 %-98 %] 98 % (12/24 0830) Weight:  [73.5 kg-77.1 kg] 77.1 kg (12/24 0738)  Hemodynamic parameters for last 24 hours:    Intake/Output from previous day: 12/23 0701 - 12/24 0700 In: 1911 [I.V.:360.1; NG/GT:1350.8; IV Piggyback:200] Out: 20 [Urine:20] Intake/Output this shift: No intake/output data recorded.  General appearance: ill appearing patient, lying in bed getting HD Neurologic: lethargic, grossly neurologically intact Heart: irregularly irregular rhythm Lungs: diminished bibasilar breath sounds Abdomen: soft, non-tender; bowel sounds normal; no masses,  no organomegaly Extremities: unna boots in place Wound: Clean and dry sternal incision without sign of infection, right neck with erythema  Lab Results: Recent Labs    02/26/24 0423 02/27/24 0500  WBC 6.3 5.4  HGB 10.2* 9.9*  HCT 32.0* 30.4*  PLT 153 148*   BMET:  Recent Labs    02/26/24 0423 02/27/24 0500  NA 135 133*  K 4.8 5.5*  CL 95* 94*  CO2 27 24  GLUCOSE 146* 166*  BUN 58* 86*  CREATININE 2.45* 3.38*  CALCIUM  9.3 9.4    PT/INR: No results for input(s): LABPROT, INR in the last 72 hours. ABG    Component Value Date/Time   PHART 7.340 (L) 02/13/2024 1618   HCO3 18.0 (L) 02/13/2024 1618   TCO2 19 (L) 02/13/2024 1618   ACIDBASEDEF 7.0 (H) 02/13/2024 1618   O2SAT 61 02/27/2024 0500   CBG (last 3)  Recent Labs    02/26/24 1958  02/26/24 2345 02/27/24 0400  GLUCAP 125* 167* 172*    Assessment/Plan: S/P Procedures (LRB): ECMO CANNULATION (N/A)  CV: Postop afib rate controlled, HR 60s-70s. On Amiodarone  200mg  BID. On heparin , will transition to Eliquis  once procedures have been completed. Coox 60%. SBP 100-120s. On dobutamine  2, wean per AHF team.   Pulm: Respiratory culture with enterobacter, completed cefepime  and vancomycin . Last CXR with small bilateral pleural effusions and improved congestion. Saturating well on RA. Encourage IS and ambulation.   GI: Severe malnutrition, tolerating TF. Continue to advance PO diet as able. As discussed with Dr. Daniel patient would benefit from PEG tube. Patient shook her head no when asked if she has made a decision about the peg tube. I asked the patient if she would like me to speak to her family members about this and she shook her head no. Will follow up after HD or tomorrow.   Endo: CBGs controlled on Lantus  15U and SSI. Continue while on TFs  Renal: Cr 3.38. Oliguric and hyperkalemic, on HD. Failed lasix  challenge. Nephrology following.   ID: Completed cefepime  and vancomycin  due to respiratory culture with enterobacter. No leukocytosis. On diflucan  for candidemia.  Expected postop ABLA: H/H 9.9/30.4, overall stable. Not clinically significant at this time.   DVT Prophylaxis: Heparin  per pharmacy  Deconditioning: Severe deconditioning, CIR following. Her insurance declined CIR twice prior to her 2nd surgery  so not hopeful they will accept this time around. If not approved for CIR will need SNF. Continue to work with PT/OT.   Dispo: Continue current care    LOS: 56 days    [name] Bend, PA-C 02/27/2024   "

## 2024-02-27 NOTE — Progress Notes (Signed)
" ° °   °  [address] [name] [address]             [phone]      Patient was much more alert once she returned to her room after HD treatment. We discussed the PEG tube again for nutrition purposes since she has very poor oral intake. She reports she would like to discuss it further with her kids before she makes a decision. I asked if she would like me to call and update her kids but she replied No, they are working. I will talk to them. Will continue the discussion over the next couple of days.  [name] Bend, PA-C 02/27/24 "

## 2024-02-27 NOTE — Progress Notes (Signed)
 SLP Cancellation Note  Patient Details Name: Tracy Baker MRN: [id number] DOB: 1955/09/15   Cancelled treatment:       Reason Eval/Treat Not Completed: Patient at procedure or test/unavailable (HD). SLP will f/u as able.    Damien Blumenthal, M.A., CCC-SLP Speech Language Pathology, Acute Rehabilitation Services  Secure Chat preferred [phone]  02/27/2024, 8:37 AM

## 2024-02-28 LAB — HEPATIC FUNCTION PANEL
ALT: 7 U/L (ref 0–44)
AST: 16 U/L (ref 15–41)
Albumin: 3.6 g/dL (ref 3.5–5.0)
Alkaline Phosphatase: 270 U/L — ABNORMAL HIGH (ref 38–126)
Bilirubin, Direct: 0.5 mg/dL — ABNORMAL HIGH (ref 0.0–0.2)
Indirect Bilirubin: 0.3 mg/dL (ref 0.3–0.9)
Total Bilirubin: 0.9 mg/dL (ref 0.0–1.2)
Total Protein: 6.3 g/dL — ABNORMAL LOW (ref 6.5–8.1)

## 2024-02-28 LAB — RENAL FUNCTION PANEL
Albumin: 3.6 g/dL (ref 3.5–5.0)
Anion gap: 13 (ref 5–15)
BUN: 60 mg/dL — ABNORMAL HIGH (ref 8–23)
CO2: 27 mmol/L (ref 22–32)
Calcium: 9.1 mg/dL (ref 8.9–10.3)
Chloride: 94 mmol/L — ABNORMAL LOW (ref 98–111)
Creatinine, Ser: 2.46 mg/dL — ABNORMAL HIGH (ref 0.44–1.00)
GFR, Estimated: 21 mL/min — ABNORMAL LOW
Glucose, Bld: 152 mg/dL — ABNORMAL HIGH (ref 70–99)
Phosphorus: 4.2 mg/dL (ref 2.5–4.6)
Potassium: 5.2 mmol/L — ABNORMAL HIGH (ref 3.5–5.1)
Sodium: 133 mmol/L — ABNORMAL LOW (ref 135–145)

## 2024-02-28 LAB — GLUCOSE, CAPILLARY
Glucose-Capillary: 134 mg/dL — ABNORMAL HIGH (ref 70–99)
Glucose-Capillary: 138 mg/dL — ABNORMAL HIGH (ref 70–99)
Glucose-Capillary: 147 mg/dL — ABNORMAL HIGH (ref 70–99)
Glucose-Capillary: 150 mg/dL — ABNORMAL HIGH (ref 70–99)
Glucose-Capillary: 157 mg/dL — ABNORMAL HIGH (ref 70–99)
Glucose-Capillary: 160 mg/dL — ABNORMAL HIGH (ref 70–99)
Glucose-Capillary: 163 mg/dL — ABNORMAL HIGH (ref 70–99)

## 2024-02-28 LAB — CBC
HCT: 30.8 % — ABNORMAL LOW (ref 36.0–46.0)
Hemoglobin: 10 g/dL — ABNORMAL LOW (ref 12.0–15.0)
MCH: 31.9 pg (ref 26.0–34.0)
MCHC: 32.5 g/dL (ref 30.0–36.0)
MCV: 98.4 fL (ref 80.0–100.0)
Platelets: 145 K/uL — ABNORMAL LOW (ref 150–400)
RBC: 3.13 MIL/uL — ABNORMAL LOW (ref 3.87–5.11)
RDW: 19.4 % — ABNORMAL HIGH (ref 11.5–15.5)
WBC: 5 K/uL (ref 4.0–10.5)
nRBC: 0 % (ref 0.0–0.2)

## 2024-02-28 LAB — COOXEMETRY PANEL
Carboxyhemoglobin: 2.3 % — ABNORMAL HIGH (ref 0.5–1.5)
Methemoglobin: <0.7 % (ref 0.0–1.5)
O2 Saturation: 61.7 %
Total hemoglobin: 9.9 g/dL — ABNORMAL LOW (ref 12.0–16.0)

## 2024-02-28 LAB — MAGNESIUM: Magnesium: 2.2 mg/dL (ref 1.7–2.4)

## 2024-02-28 LAB — HEPARIN LEVEL (UNFRACTIONATED): Heparin Unfractionated: 0.35 [IU]/mL (ref 0.30–0.70)

## 2024-02-28 MED ORDER — CARMEX CLASSIC LIP BALM EX OINT
TOPICAL_OINTMENT | Freq: Once | CUTANEOUS | Status: AC
  Filled 2024-02-28: qty 10

## 2024-02-28 MED ORDER — BIOTENE DRY MOUTH MT LIQD
15.0000 mL | OROMUCOSAL | Status: DC | PRN
  Administered 2024-02-29: 15 mL via OROMUCOSAL
  Filled 2024-02-28: qty 473
  Filled 2024-02-28: qty 15

## 2024-02-28 MED ORDER — SODIUM ZIRCONIUM CYCLOSILICATE 10 G PO PACK
10.0000 g | PACK | Freq: Every day | ORAL | Status: AC
  Administered 2024-02-28: 10 g via ORAL
  Filled 2024-02-28 (×2): qty 1

## 2024-02-28 NOTE — Progress Notes (Addendum)
 "     [address] Zone [place] [address]             [phone]      19 Days Post-Op Procedures (LRB): ECMO CANNULATION (N/A) Subjective: Patient sleeping upon arrival, very lethargic and fatigued. Only nodding and shaking her head to my questions again this AM. Patient shook her head not when asked if she had decided on PEG tube yet. She did nod her head yes to being able to eat more yesterday.   Objective: Vital signs in last 24 hours: Temp:  [97.4 F (36.3 C)-98.3 F (36.8 C)] 97.7 F (36.5 C) (12/25 0354) Pulse Rate:  [60-104] 76 (12/25 0354) Cardiac Rhythm: Atrial flutter (12/24 2000) Resp:  [12-19] 14 (12/25 0354) BP: (97-125)/(57-76) 123/67 (12/25 0354) SpO2:  [95 %-98 %] 95 % (12/25 0354) Weight:  [70.5 kg-77.1 kg] 70.5 kg (12/25 0354)  Hemodynamic parameters for last 24 hours:    Intake/Output from previous day: 12/24 0701 - 12/25 0700 In: 1646.8 [I.V.:294.3; NG/GT:1102.5; IV Piggyback:250] Out: 3000  Intake/Output this shift: No intake/output data recorded.  General appearance: ill appearing, lying in bed, lethargic, flat affect Neurologic: intact Heart: irregularly irregular rhythm Lungs: slightly diminished bibasilar breath sounds Abdomen: soft, non-tender; bowel sounds normal; no masses,  no organomegaly Extremities: unna boots in place Wound: Clean and dry without sign of infection  Lab Results: Recent Labs    02/27/24 0500 02/28/24 0355  WBC 5.4 5.0  HGB 9.9* 10.0*  HCT 30.4* 30.8*  PLT 148* 145*   BMET:  Recent Labs    02/27/24 0500 02/28/24 0355  NA 133* 133*  K 5.5* 5.2*  CL 94* 94*  CO2 24 27  GLUCOSE 166* 152*  BUN 86* 60*  CREATININE 3.38* 2.46*  CALCIUM  9.4 9.1    PT/INR: No results for input(s): LABPROT, INR in the last 72 hours. ABG    Component Value Date/Time   PHART 7.340 (L) 02/13/2024 1618   HCO3 18.0 (L) 02/13/2024 1618   TCO2 19 (L) 02/13/2024 1618   ACIDBASEDEF 7.0 (H) 02/13/2024 1618    O2SAT 61.7 02/28/2024 0355   CBG (last 3)  Recent Labs    02/27/24 2013 02/28/24 0002 02/28/24 0349  GLUCAP 147* 150* 160*    Assessment/Plan: S/P Procedures (LRB): ECMO CANNULATION (N/A)  Neuro: Flat affect, depression. On Seroquel  and Sertraline .  CV: Postop afib rate controlled, HR 60s but was down into the 40s at times overnight. On Amiodarone  200mg  BID. On heparin , will transition to Eliquis  once procedures have been completed. Coox 61%. SBP mostly 100-120s. On dobutamine  2, wean per AHF team.    Pulm: Respiratory culture with enterobacter, completed cefepime  and vancomycin . Last CXR with small bilateral pleural effusions and improved congestion. Saturating well on RA. Encourage IS and ambulation.    GI: Severe malnutrition, tolerating TF. Continue to advance PO diet as able. Reports she as trouble chewing due to previous teeth extraction. Make sure she is getting soft foods to eat orally. Continue Remeron  for appetite. As discussed with Dr. Daniel patient would benefit from PEG tube for malnutrition because she likely will not have enough oral intake to fulfill nutritional needs. Again discussed need for PEG and patient shook her head no when asked if she had made a decision on whether she would want PEG tube or not. Patient to discuss further with family and has asked me not to discuss with family.    Endo: CBGs controlled  on Lantus  15U and SSI. Continue while on TFs   Renal: Cr 2.46. Oliguric and hyperkalemic, on HD. Failed lasix  challenge. Nephrology following. K 5.2, only down from 5.5 yesterday with HD. May need Lokelma . Likely to get next HD session tomorrow. Mg 2.2, at goal.    ID: Completed cefepime  and vancomycin  due to respiratory culture with enterobacter. No leukocytosis. On diflucan  for candidemia.   Expected postop ABLA: H/H 10/30.8, stable. Not clinically significant at this time. Thrombocytopenia improving.    DVT Prophylaxis: Heparin  per pharmacy    Deconditioning: Severe deconditioning, CIR following. Her insurance declined CIR twice prior to her 2nd surgery so not hopeful they will accept this time around. If not approved for CIR will need SNF. Continue to work with PT/OT.    Dispo: Continue current care, hopefully can d/c to CIR once medically stable   LOS: 57 days    [name] Bend, [place] 02/28/2024   Agree Dispo planning  Linnie KIDD Linsey Arteaga   "

## 2024-02-28 NOTE — Progress Notes (Signed)
 PHARMACY - ANTICOAGULATION CONSULT NOTE  Pharmacy Consult for heparin  Indication: atrial fibrillation  Allergies[1]  Patient Measurements: Height: 5' 4 (162.6 cm) Weight: 70.5 kg (155 lb 6.8 oz) IBW/kg (Calculated) : 54.7 HEPARIN  DW (KG): 66.5  Vital Signs: Temp: 97.7 F (36.5 C) (12/25 0354) Temp Source: Oral (12/25 0354) BP: 123/67 (12/25 0354) Pulse Rate: 76 (12/25 0354)  Labs: Recent Labs    02/26/24 0423 02/27/24 0500 02/28/24 0355  HGB 10.2* 9.9* 10.0*  HCT 32.0* 30.4* 30.8*  PLT 153 148* 145*  HEPARINUNFRC 0.38 0.36 0.35  CREATININE 2.45* 3.38* 2.46*    Estimated Creatinine Clearance: 21.1 mL/min (A) (by C-G formula based on SCr of 2.46 mg/dL (H)).   Medical History: Past Medical History:  Diagnosis Date   Medical history non-contributory    Thyroid  disease    Vertigo     Medications:  Scheduled:   amiodarone   200 mg Per Tube BID   clonazePAM   0.5 mg Per Tube QHS   feeding supplement  237 mL Oral TID BM   feeding supplement (PROSource TF20)  60 mL Per Tube Daily   fiber supplement (BANATROL TF)  60 mL Per Tube BID   Gerhardt's butt cream   Topical BID   insulin  aspart  0-9 Units Subcutaneous Q4H   insulin  glargine  15 Units Subcutaneous Daily   mirtazapine   30 mg Per Tube QHS   mouth rinse  15 mL Mouth Rinse 4 times per day   pantoprazole   40 mg Oral Daily   QUEtiapine   25 mg Per Tube QHS   scopolamine   1 patch Transdermal Q72H   sertraline   25 mg Per Tube QHS   sodium chloride  flush  10-40 mL Intracatheter Q12H   sodium zirconium cyclosilicate   10 g Oral Daily    Assessment: Tracy Baker is a 29 YOF now s/p MV repair and redo on 12/5. Hospitalization complicated by acute RV failure with cardiogenic shock s/p Protek duo VV ECMO cannula. Patient also with atrial fibrillation throughout this hospitalization.  Discussed with CCM/HF team and decision made to start low-dose heparin  infusion 12/14. Pharmacy consulted for heparin  dosing.   Heparin   level 0.35 is therapeutic with heparin  running at 1150 units/hr. Hgb (10.0) and PLTs (145) are stable. Per RN, no report of pauses, issues with the line, or signs of bleeding.   Goal of Therapy:  Heparin  level 0.3-0.5 Monitor platelets by anticoagulation protocol: Yes   Plan:  Continue heparin  infusion at 1150 units/hr  Daily heparin  level, CBC F/u plans for Eliquis  eventually   Thank you for allowing pharmacy to be a part of this patients care.   Tracy Baker, PharmD PGY2 Cardiology Pharmacy Resident  Please check AMION for all [facility] Pharmacy phone numbers After 10:00 PM, call Main Pharmacy [phone]    [1]  Allergies Allergen Reactions   Codeine Anaphylaxis and Nausea And Vomiting   Bactoshield Chg [Chlorhexidine  Gluconate] Dermatitis   Chlorhexidine  Dermatitis and Rash   Red Dye #40 (Allura Red) Nausea Only

## 2024-02-28 NOTE — Plan of Care (Signed)
" °  Problem: Education: Goal: Knowledge of General Education information will improve Description: Including pain rating scale, medication(s)/side effects and non-pharmacologic comfort measures Outcome: Progressing   Problem: Health Behavior/Discharge Planning: Goal: Ability to manage health-related needs will improve Outcome: Progressing   Problem: Clinical Measurements: Goal: Ability to maintain clinical measurements within normal limits will improve Outcome: Progressing Goal: Will remain free from infection Outcome: Progressing   Problem: Pain Managment: Goal: General experience of comfort will improve and/or be controlled Outcome: Progressing   Problem: Elimination: Goal: Will not experience complications related to bowel motility Outcome: Progressing   Problem: Safety: Goal: Ability to remain free from injury will improve Outcome: Progressing   Problem: Skin Integrity: Goal: Risk for impaired skin integrity will decrease Outcome: Progressing   "

## 2024-02-29 DIAGNOSIS — B377 Candidal sepsis: Secondary | ICD-10-CM | POA: Diagnosis not present

## 2024-02-29 LAB — GLUCOSE, CAPILLARY
Glucose-Capillary: 123 mg/dL — ABNORMAL HIGH (ref 70–99)
Glucose-Capillary: 139 mg/dL — ABNORMAL HIGH (ref 70–99)
Glucose-Capillary: 145 mg/dL — ABNORMAL HIGH (ref 70–99)
Glucose-Capillary: 150 mg/dL — ABNORMAL HIGH (ref 70–99)
Glucose-Capillary: 92 mg/dL (ref 70–99)

## 2024-02-29 LAB — CBC
HCT: 30.2 % — ABNORMAL LOW (ref 36.0–46.0)
Hemoglobin: 9.7 g/dL — ABNORMAL LOW (ref 12.0–15.0)
MCH: 31.9 pg (ref 26.0–34.0)
MCHC: 32.1 g/dL (ref 30.0–36.0)
MCV: 99.3 fL (ref 80.0–100.0)
Platelets: 141 K/uL — ABNORMAL LOW (ref 150–400)
RBC: 3.04 MIL/uL — ABNORMAL LOW (ref 3.87–5.11)
RDW: 19.2 % — ABNORMAL HIGH (ref 11.5–15.5)
WBC: 5.1 K/uL (ref 4.0–10.5)
nRBC: 0 % (ref 0.0–0.2)

## 2024-02-29 LAB — HEPATIC FUNCTION PANEL
ALT: 8 U/L (ref 0–44)
AST: 18 U/L (ref 15–41)
Albumin: 3.6 g/dL (ref 3.5–5.0)
Alkaline Phosphatase: 285 U/L — ABNORMAL HIGH (ref 38–126)
Bilirubin, Direct: 0.5 mg/dL — ABNORMAL HIGH (ref 0.0–0.2)
Indirect Bilirubin: 0.3 mg/dL (ref 0.3–0.9)
Total Bilirubin: 0.8 mg/dL (ref 0.0–1.2)
Total Protein: 6.2 g/dL — ABNORMAL LOW (ref 6.5–8.1)

## 2024-02-29 LAB — RENAL FUNCTION PANEL
Albumin: 3.7 g/dL (ref 3.5–5.0)
Anion gap: 14 (ref 5–15)
BUN: 88 mg/dL — ABNORMAL HIGH (ref 8–23)
CO2: 25 mmol/L (ref 22–32)
Calcium: 9.3 mg/dL (ref 8.9–10.3)
Chloride: 93 mmol/L — ABNORMAL LOW (ref 98–111)
Creatinine, Ser: 3.29 mg/dL — ABNORMAL HIGH (ref 0.44–1.00)
GFR, Estimated: 15 mL/min — ABNORMAL LOW
Glucose, Bld: 148 mg/dL — ABNORMAL HIGH (ref 70–99)
Phosphorus: 5.3 mg/dL — ABNORMAL HIGH (ref 2.5–4.6)
Potassium: 5.6 mmol/L — ABNORMAL HIGH (ref 3.5–5.1)
Sodium: 132 mmol/L — ABNORMAL LOW (ref 135–145)

## 2024-02-29 LAB — COOXEMETRY PANEL
Carboxyhemoglobin: 2.3 % — ABNORMAL HIGH (ref 0.5–1.5)
Methemoglobin: <0.7 % (ref 0.0–1.5)
O2 Saturation: 65.8 %
Total hemoglobin: 9.9 g/dL — ABNORMAL LOW (ref 12.0–16.0)

## 2024-02-29 LAB — HEPARIN LEVEL (UNFRACTIONATED): Heparin Unfractionated: 0.35 [IU]/mL (ref 0.30–0.70)

## 2024-02-29 LAB — MAGNESIUM: Magnesium: 2.4 mg/dL (ref 1.7–2.4)

## 2024-02-29 MED ORDER — VITAL 1.5 CAL PO LIQD
1000.0000 mL | ORAL | Status: DC
  Administered 2024-02-29 – 2024-03-02 (×3): 1000 mL
  Filled 2024-02-29 (×3): qty 1000

## 2024-02-29 MED ORDER — HEPARIN SODIUM (PORCINE) 1000 UNIT/ML IJ SOLN
INTRAMUSCULAR | Status: AC
  Filled 2024-02-29: qty 5

## 2024-02-29 NOTE — Plan of Care (Signed)
" °  Problem: Education: Goal: Knowledge of General Education information will improve Description: Including pain rating scale, medication(s)/side effects and non-pharmacologic comfort measures Outcome: Progressing   Problem: Clinical Measurements: Goal: Cardiovascular complication will be avoided Outcome: Progressing   Problem: Nutrition: Goal: Adequate nutrition will be maintained Outcome: Progressing   Problem: Skin Integrity: Goal: Risk for impaired skin integrity will decrease Outcome: Progressing   Problem: Cardiac: Goal: Will achieve and/or maintain hemodynamic stability Outcome: Progressing   "

## 2024-02-29 NOTE — Progress Notes (Signed)
" ° °   °  [address] Zone [place] [address]             [phone]    Attempted to call both son and daughter at listed numbers to discuss PEG tube and nocturnal tube feeding plan with calorie count as requested by patient but neither picked up. Will try again on Monday.   [name] Bend, PA-C  "

## 2024-02-29 NOTE — Progress Notes (Addendum)
 "     Advanced Heart Failure Rounding Note  Cardiologist: Lonni Cash, MD  HF Cardiologist: Dr. Zenaida Chief Complaint: S/p MV Repair / MV endocarditis  Patient Profile   Tracy Baker is a 68 y.o. female with hx of recent nephrolithiasis s/p ureteral stent and lithotripsy at [facility] 10/10 with stent removal. 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 Post-op course c/b post cardiotomy shock, junctional bradycardia, afib w/ RVR and HAP.   Significant events:    12/2 RHC (on 0.125 of Milrinone ): RA 4, PA 42/13 (22), PCW 12 with Vwaves to 20, CO/CI TD 3.37/2.03, PAPi 7.5 12/5 S/P Redo MVR - Intra Op given  4UPRBCs, 2UPLTs, 2FFP, 1Cyro 12/6 Protek Duo cannulation 12/8 Decannulated.  Post-op echo showed EF 50%, RV mildly dilated and mildly dysfunctional, s/p bioprosthetic mitral valve (stable). 12/13 CVVH begun 12/15: extubated 12/16: CRRT stopped.  12/17: Started DBA wean 12/19: Failed lasix  challenge. iHD 12/22: TDC placed  Subjective:    DBA remains at 2. Co-ox 66% 3L off with iHD 12/24. Next HD tomorrow (TTS). Weight remains up 20 lbs.   Lethargic, in bed.   Objective:    Weight Range: 70.9 kg Body mass index is 26.83 kg/m.   Vital Signs:   Temp:  [98 F (36.7 C)-98.3 F (36.8 C)] 98.1 F (36.7 C) (12/26 0400) Pulse Rate:  [63-91] 68 (12/26 0400) Resp:  [13-19] 15 (12/26 0400) BP: (111-122)/(57-77) 116/57 (12/26 0400) SpO2:  [95 %-98 %] 98 % (12/26 0400) Weight:  [70.9 kg] 70.9 kg (12/26 0450) Last BM Date : 02/26/24  Weight change: Filed Weights   02/27/24 0738 02/28/24 0354 02/29/24 0450  Weight: 77.1 kg 70.5 kg 70.9 kg   Intake/Output:  Intake/Output Summary (Last 24 hours) at 02/29/2024 0714 Last data filed at 02/28/2024 2000 Gross per 24 hour  Intake --  Output 0 ml  Net 0 ml   Physical Exam   General: Ill appearing. No distress  Cardiac: JVP ~10cm. No murmurs  Extremities: Warm and dry.  3+ edema.  +UNNA  General: Ill appearing. No distress  Cardiac: JVP to jaw. No murmurs  Extremities: Warm and dry.  3+ edema.  Neuro: lethargic, flat  Telemetry   AF 70s (personally reviewed)  Labs    CBC Recent Labs    02/28/24 0355 02/29/24 0458  WBC 5.0 5.1  HGB 10.0* 9.7*  HCT 30.8* 30.2*  MCV 98.4 99.3  PLT 145* 141*   Basic Metabolic Panel Recent Labs    87/74/74 0355 02/29/24 0458  NA 133* 132*  K 5.2* 5.6*  CL 94* 93*  CO2 27 25  GLUCOSE 152* 148*  BUN 60* 88*  CREATININE 2.46* 3.29*  CALCIUM  9.1 9.3  MG 2.2 2.4  PHOS 4.2 5.3*   Liver Function Tests Recent Labs    02/28/24 0355 02/29/24 0458  AST 16 18  ALT 7 8  ALKPHOS 270* 285*  BILITOT 0.9 0.8  PROT 6.3* 6.2*  ALBUMIN  3.6  3.6 3.6  3.7   Medications:    Scheduled Medications:  amiodarone   200 mg Per Tube BID   clonazePAM   0.5 mg Per Tube QHS   feeding supplement  237 mL Oral TID BM   feeding supplement (PROSource TF20)  60 mL Per Tube Daily   fiber supplement (BANATROL TF)  60 mL Per Tube BID   Gerhardt's butt cream   Topical BID   insulin  aspart  0-9 Units Subcutaneous Q4H   insulin  glargine  15 Units Subcutaneous Daily   mirtazapine   30 mg Per Tube QHS   mouth rinse  15 mL Mouth Rinse 4 times per day   pantoprazole   40 mg Oral Daily   QUEtiapine   25 mg Per Tube QHS   scopolamine   1 patch Transdermal Q72H   sertraline   25 mg Per Tube QHS   sodium chloride  flush  10-40 mL Intracatheter Q12H   sodium zirconium cyclosilicate   10 g Oral Daily    Infusions:  anticoagulant sodium citrate      DOBUTamine  2 mcg/kg/min (02/28/24 0405)   feeding supplement (VITAL 1.5 CAL) 1,000 mL (02/28/24 1208)   fluconazole  (DIFLUCAN ) IV 400 mg (02/28/24 1201)   heparin  1,150 Units/hr (02/29/24 0256)   promethazine  (PHENERGAN ) injection (IM or IVPB) 6.25 mg (02/28/24 2255)    PRN Medications: acetaminophen , albuterol , alteplase , anticoagulant sodium citrate , antiseptic oral rinse, heparin , lidocaine  (PF),  lidocaine -prilocaine , mouth rinse, pentafluoroprop-tetrafluoroeth, polyethylene glycol, promethazine  (PHENERGAN ) injection (IM or IVPB), simethicone , sodium chloride  flush  Assessment/Plan   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 Kerrin for candida endocarditis. Long-term fluconazole  per ID.  - Pre-op L/RHC 01/04/24 with no CAD and normal L/R heart pressures.  - Failed initial Milrinone  wean. Eventually weaned off, unfortunately restarted 12/1 for low output.  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 - Echo 12/19 EF 60% RV mildly HK  - Co-ox 66% on DBA 2. Would continue until fluid off with iHD - CVVH stopped 12/16. Failed lasix  challenge. Started iHD 12/19, tolerating well. Next session 12/24. - Volume remains up on iHD TTS schedule. Remains ~ 20 lb above pre-op weight. Reaching out to nephrology about increasing HD sessions for further fluid removal.   Acute hypoxic respiratory failure: Suspect component of infection/aspiration PNA given unilateral disease.  - Respiratory culture- Enterobacter Completed cefepime  and vanc. - stable on RA  Coagulopathy/thrombocytopenia/ABLA anemia:  - Slowly improving. - Suspect this is inflammatory due to critical illness.   Sick sinus syndrome - Initially with asystole underneath (as of 12/10) - Now with atrial fibrillation   Afib, persistent - continue heparin  gtt; until all procedures completed. May need PEG tube, per TCTS - completed amio load, decrease to 200 mg bid. No cardioversion for now with concerns for bradycardia.  - rate controlled  AKI on  CKD 3b - Stopped CVVH 12/16. Failed lasix  challenge. iHD 12/19. Tolerated well. - plan as above  Protein-calorie malnutrition - On TF. Will discuss with RD. Adjust feeds as able to help promote more po intake - Remeron  to stimulate appetite - possible PEG tube per sx  Pressure Ulcer R Buttock, DTI - Q2 turns, offload  Left radial artery occlusion - Left ulnar artery is patent.  - Continue heparin  gtt  Jordan Lee, NP 7:14 AM  Advanced Heart Failure Team Pager [phone] (M-F; 7a - 5p)    Please visit [email or web address]: For overnight coverage please call cardiology fellow first. If fellow not available call Shock/ECMO MD on call.  For ECMO / Mechanical Support (Impella, IABP, LVAD) issues call Shock / ECMO MD on call.   Patient seen and examined with the above-signed Advanced Practice Provider and/or Housestaff. I personally  reviewed laboratory data, imaging studies and relevant notes. I independently examined the patient and formulated the important aspects of the plan. I have edited the note to reflect any of my changes or salient points. I have personally discussed the plan with the patient and/or family.  Tolerating iHD on DBA. Weight still up 20 pounds. Weak   General:  Sitting up in bed. No resp difficulty HEENT: normal Neck: supple. JVP 10   Cor: Irregular rate & rhythm. No rubs, gallops or murmurs. Lungs: clear Abdomen: soft, nontender, nondistended.Good bowel sounds. Extremities: no cyanosis, clubbing, rash, 2+ edema Neuro: alert & orientedx3, cranial nerves grossly intact. moves all 4 extremities w/o difficulty. Affect pleasant  Continue DBA to support volume removal with iHD.   Toribio Fuel, MD  12:26 PM     "

## 2024-02-29 NOTE — Progress Notes (Signed)
 PHARMACY - ANTICOAGULATION CONSULT NOTE  Pharmacy Consult for heparin  Indication: atrial fibrillation  Allergies[1]  Patient Measurements: Height: 5' 4 (162.6 cm) Weight: 70.9 kg (156 lb 4.9 oz) IBW/kg (Calculated) : 54.7 HEPARIN  DW (KG): 66.5  Vital Signs: Temp: 98.1 F (36.7 C) (12/26 0700) Temp Source: Oral (12/26 0700) BP: 102/66 (12/26 0700) Pulse Rate: 82 (12/26 0700)  Labs: Recent Labs    02/27/24 0500 02/28/24 0355 02/29/24 0458  HGB 9.9* 10.0* 9.7*  HCT 30.4* 30.8* 30.2*  PLT 148* 145* 141*  HEPARINUNFRC 0.36 0.35 0.35  CREATININE 3.38* 2.46* 3.29*    Estimated Creatinine Clearance: 15.8 mL/min (A) (by C-G formula based on SCr of 3.29 mg/dL (H)).   Medical History: Past Medical History:  Diagnosis Date   Medical history non-contributory    Thyroid  disease    Vertigo     Medications:  Scheduled:   amiodarone   200 mg Per Tube BID   clonazePAM   0.5 mg Per Tube QHS   feeding supplement  237 mL Oral TID BM   feeding supplement (PROSource TF20)  60 mL Per Tube Daily   fiber supplement (BANATROL TF)  60 mL Per Tube BID   Gerhardt's butt cream   Topical BID   insulin  aspart  0-9 Units Subcutaneous Q4H   insulin  glargine  15 Units Subcutaneous Daily   mirtazapine   30 mg Per Tube QHS   mouth rinse  15 mL Mouth Rinse 4 times per day   pantoprazole   40 mg Oral Daily   QUEtiapine   25 mg Per Tube QHS   scopolamine   1 patch Transdermal Q72H   sertraline   25 mg Per Tube QHS   sodium chloride  flush  10-40 mL Intracatheter Q12H   sodium zirconium cyclosilicate   10 g Oral Daily    Assessment: Tracy Baker is a 42 YOF now s/p MV repair and redo on 12/5. Hospitalization complicated by acute RV failure with cardiogenic shock s/p Protek duo VV ECMO cannula. Patient also with atrial fibrillation throughout this hospitalization.  Discussed with CCM/HF team and decision made to start low-dose heparin  infusion 12/14. Pharmacy consulted for heparin  dosing.   Heparin   level 0.35 is therapeutic with heparin  running at 1150 units/hr. CBC stable.  Goal of Therapy:  Heparin  level 0.3-0.5 Monitor platelets by anticoagulation protocol: Yes   Plan:  Continue heparin  infusion at 1150 units/hr  Daily heparin  level, CBC F/u plans for Eliquis  eventually   Tracy Baker, PharmD, BCPS, [facility] Clinical Pharmacist [phone] Please check AMION for all [facility] Pharmacy numbers 02/29/2024     [1]  Allergies Allergen Reactions   Codeine Anaphylaxis and Nausea And Vomiting   Bactoshield Chg [Chlorhexidine  Gluconate] Dermatitis   Chlorhexidine  Dermatitis and Rash   Red Dye #40 (Allura Red) Nausea Only

## 2024-02-29 NOTE — TOC Progression Note (Addendum)
 Transition of Care [facility]) - Progression Note    Patient Details  Name: Tracy Baker MRN: [id number] Date of Birth: 28-Jun-1955  Transition of Care [facility]) CM/SW Contact  Tracy Baker Phone Number: [phone] 02/29/2024, 10:13 AM  Clinical Narrative:   Patient is not medically ready. Was previously declined twice by CIR. Will continue to follow and monitor.   HF CSW/CM will continue to follow and monitor for dc readiness.     Expected Discharge Plan: Skilled Nursing Facility Barriers to Discharge: Continued Medical Work up               Expected Discharge Plan and Services   Discharge Planning Services: CM Consult Post Acute Care Choice: Skilled Nursing Facility Living arrangements for the past 2 months: Apartment                                       Social Drivers of Health (SDOH) Interventions SDOH Screenings   Food Insecurity: No Food Insecurity (12/29/2023)  Housing: Low Risk (12/29/2023)  Transportation Needs: No Transportation Needs (12/29/2023)  Utilities: Not At Risk (12/29/2023)  Alcohol Screen: Low Risk (06/08/2022)  Depression (PHQ2-9): Low Risk (11/08/2023)  Social Connections: Socially Isolated (12/29/2023)  Tobacco Use: High Risk (02/23/2024)    Readmission Risk Interventions     No data to display

## 2024-02-29 NOTE — Progress Notes (Signed)
 Brief Nutrition Support Note  Cardiothoracic team reach out after pt reports that she does not desire to have PEG placed and reported to PA that she was not eating because of feeling full. PA requests adjusting to nocturnal feeds and performing kcal count to provide tangible evidence on PO intake.   Pt had kcal count completed and nocturnal TF from 12/19-12/21 which showed pt was meeting ~5% of her needs orally. Will repeat kcal count and nocturnal feeds. However, unless pt has had a dramatic change in her ability and desire to increase PO do not feel this will change the need for a PEG and ongoing enteral nutrition. Also note that on 12/19 Pt reports continued poor appetite, which has been an issue since admission on 10/29. Poor intake appears to be a chronic issue and if pt is adamant she does not want PEG, would likely benefit from GOC discussion and PMT involvement to ensure that pt's wishes are inline with trajectory of care.   Discussed plan with RN.  New plan is as follows: Adjust tube feeding via cortrak to nocturnal: Vital 1.5 at 75 ml/h x 12 hours (900 ml per day) Prosource TF20 60 ml 1x/d Provides 1430 kcal, 81 gm protein, 688 ml free water  daily Discontinue banatrol BID, K elevated for several days and pt without recorded BM since 12/23. Calorie Count x 48 hours to evaluate PO intake   Vernell Lukes, RD, LDN, CNSC Registered Dietitian II Please reach out via secure chat

## 2024-02-29 NOTE — Progress Notes (Addendum)
 RN notified CVTS team patient son requesting to speak with MD about updated plan of care for patient.

## 2024-02-29 NOTE — Progress Notes (Signed)
" °   02/29/24 1830  Vitals  Temp (!) 97.5 F (36.4 C)  Pulse Rate 93  Resp (!) 21  BP 114/76  SpO2 97 %  O2 Device Room Air  Weight 68.1 kg  Type of Weight Post-Dialysis  Oxygen Therapy  Patient Activity (if Appropriate) In bed  Pulse Oximetry Type Continuous  Oximetry Probe Site Changed No  Post Treatment  Dialyzer Clearance Lightly streaked  Liters Processed 54  Fluid Removed (mL) 3000 mL  Tolerated HD Treatment Yes  Post-Hemodialysis Comments P. tolerated tx well    "

## 2024-02-29 NOTE — Progress Notes (Signed)
" ° °  Inpatient Rehabilitation Admissions Coordinator   I met with patient at bedside. I discussed that pending her medical and therapy progress, will pursue insurance approval again for possible Cir admit. After first surgery, Insurance denied twice our request for CIR admit. At that time she was CGA assist level. Her functional level has declined postop second surgery. We will follow up next week.  Heron Leavell, RN, MSN Rehab Admissions Coordinator [phone] 02/29/2024 4:44 PM  "

## 2024-02-29 NOTE — Progress Notes (Signed)
 Pt. Came in awake and oriented with no complaints Consent verified and on file. Started with no complaints  UF goal: 3L Tx duration: 3 hours  Access used: Left CVC Access issue: None  During tx, Pt. Has high negative venous pressure and TMP. Needed to restring the machine  Tx completed and tolerated. Catheter dwelled and locked with heparin  Endorsed to floor nurse Transported to room  Estanislao Morley Sergi Gellner,RN [facility]

## 2024-02-29 NOTE — Progress Notes (Signed)
 " [facility] Progress Note    Assessment/ Plan:   AKI -b/l Cr 0.4-0.8. 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  Appreciate help -will cont to monitor for any recovery  Plan on hd today and tomorrow to help offload; appears that her ~EDW ~61-62kg.  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 On antifungal therapy -on dobuatmine-- wean as able (still on)  AHRF -extubated on 12/15 -currently stable -UF as tolerated with HD  Anemia -transfuse PRN for Hgb <7 -not candidate for esa with infection  Hyponatremia: mild and intermittent.  Manage volume with dialysis  Hyperkalemia: 2/2 ARF. Mgmt w/ HD as above   Subjective:   Patient tired but no complaints; denies n/v. NGT feeds, poor appetite.   Objective:   BP 102/66 (BP Location: Left Arm)   Pulse 82   Temp 98.1 F (36.7 C) (Oral)   Resp 12   Ht 5' 4 (1.626 m)   Wt 70.9 kg   SpO2 94%   BMI 26.83 kg/m   Intake/Output Summary (Last 24 hours) at 02/29/2024 0945 Last data filed at 02/28/2024 2000 Gross per 24 hour  Intake --  Output 0 ml  Net 0 ml   Weight change: -6.2 kg  Physical Exam: Gen: ill/tired appearing, NAD CVS: normal rate, no rub Resp: bl chest rise, normal wob Abd: soft Ext: trace edema at the bilateral ankles Neuro: awake, alert, drowsy Dialysis access: LIJ temp HD cath  Imaging: No results found.     Labs: BMET Recent Labs  Lab 02/23/24 0455 02/24/24 0514 02/25/24 0527 02/26/24 0423 02/27/24 0500 02/28/24 0355 02/29/24 0458  NA 133* 133* 132* 135 133* 133* 132*  K 4.5 4.7 5.1 4.8 5.5* 5.2* 5.6*  CL 95* 94* 94* 95* 94* 94* 93*  CO2 24 24 24 27 24 27 25   GLUCOSE 174* 195* 194* 146* 166* 152* 148*  BUN 81* 61* 91* 58* 86* 60* 88*  CREATININE 2.64* 2.19* 3.03* 2.45* 3.38* 2.46* 3.29*  CALCIUM  9.0  8.9 9.0 9.3 9.4 9.1 9.3  PHOS 5.1* 3.8 4.5 4.2 4.7* 4.2 5.3*   CBC Recent Labs  Lab 02/26/24 0423 02/27/24 0500 02/28/24 0355 02/29/24 0458  WBC 6.3 5.4 5.0 5.1  HGB 10.2* 9.9* 10.0* 9.7*  HCT 32.0* 30.4* 30.8* 30.2*  MCV 100.9* 97.7 98.4 99.3  PLT 153 148* 145* 141*    Medications:     amiodarone   200 mg Per Tube BID   clonazePAM   0.5 mg Per Tube QHS   feeding supplement  237 mL Oral TID BM   feeding supplement (PROSource TF20)  60 mL Per Tube Daily   fiber supplement (BANATROL TF)  60 mL Per Tube BID   Gerhardt's butt cream   Topical BID   insulin  aspart  0-9 Units Subcutaneous Q4H   insulin  glargine  15 Units Subcutaneous Daily   mirtazapine   30 mg Per Tube QHS   mouth rinse  15 mL Mouth Rinse 4 times per day   pantoprazole   40 mg Oral Daily   QUEtiapine   25 mg Per Tube QHS   scopolamine   1 patch Transdermal Q72H   sertraline   25 mg Per Tube QHS   sodium chloride  flush  10-40 mL Intracatheter Q12H   sodium zirconium cyclosilicate   10 g Oral Daily       "

## 2024-02-29 NOTE — Progress Notes (Addendum)
 "     [address] [place] [address]             [phone]      20 Days Post-Op Procedures (LRB): ECMO CANNULATION (N/A) Subjective: Patient reports her mouth is dry but feels better. She has some nausea that improves when I take something. She reports she does not want PEG because she does not want another surgery.  Objective: Vital signs in last 24 hours: Temp:  [98 F (36.7 C)-98.3 F (36.8 C)] 98.1 F (36.7 C) (12/26 0400) Pulse Rate:  [63-91] 68 (12/26 0400) Resp:  [13-19] 15 (12/26 0400) BP: (111-122)/(57-77) 116/57 (12/26 0400) SpO2:  [95 %-98 %] 98 % (12/26 0400) Weight:  [70.9 kg] 70.9 kg (12/26 0450)  Hemodynamic parameters for last 24 hours:    Intake/Output from previous day: No intake/output data recorded. Intake/Output this shift: No intake/output data recorded.  General appearance: ill appearing, flat affect, lethargic Neurologic: lethargic but neurologically grossly intact Heart: irregularly irregular rhythm Lungs: slightly diminished bibasilar breath sounds Abdomen: soft, non-tender; bowel sounds normal; no masses,  no organomegaly Extremities: edema trace BLE Wound: Clean and dry without sign of infection  Lab Results: Recent Labs    02/28/24 0355 02/29/24 0458  WBC 5.0 5.1  HGB 10.0* 9.7*  HCT 30.8* 30.2*  PLT 145* 141*   BMET:  Recent Labs    02/28/24 0355 02/29/24 0458  NA 133* 132*  K 5.2* 5.6*  CL 94* 93*  CO2 27 25  GLUCOSE 152* 148*  BUN 60* 88*  CREATININE 2.46* 3.29*  CALCIUM  9.1 9.3    PT/INR: No results for input(s): LABPROT, INR in the last 72 hours. ABG    Component Value Date/Time   PHART 7.340 (L) 02/13/2024 1618   HCO3 18.0 (L) 02/13/2024 1618   TCO2 19 (L) 02/13/2024 1618   ACIDBASEDEF 7.0 (H) 02/13/2024 1618   O2SAT 65.8 02/29/2024 0458   CBG (last 3)  Recent Labs    02/28/24 2011 02/28/24 2349 02/29/24 0420  GLUCAP 134* 147* 150*    Assessment/Plan: S/P Procedures  (LRB): ECMO CANNULATION (N/A)  Neuro: Flat affect, depression. On Seroquel  and Sertraline .   CV: Postop afib rate controlled, HR 70s. On Amiodarone  200mg  BID. On heparin , will transition to Eliquis  once procedures have been completed. Patient does not want PEG right now so may be able to transition. Coox 65%. SBP mostly 110-120s. On dobutamine  2, wean per AHF team.    Pulm: Respiratory culture with enterobacter, completed cefepime  and vancomycin . Last CXR with small bilateral pleural effusions and improved congestion. Saturating well on RA. Encourage IS and ambulation.    GI: Severe malnutrition, tolerating TF. Continue to advance PO diet as able. Reports she was able to eat a little yesterday but gets full easily. Continue Remeron  for appetite. As discussed with Dr. Daniel patient would benefit from PEG tube for malnutrition because she likely will not have enough oral intake to fulfill nutritional needs. Patient reports after discussing with her family she does not want PEG placement because she does not want another surgery. We discussed the importance of PO diet if she wasn't going to get PEG. She was agreeable for me to discuss with family today. Will discuss with registered dietician to see if we can try nocturnal RF with calorie count again.    Endo: CBGs controlled on Lantus  15U and SSI. Continue while on TFs   Renal: Cr 3.29. Oliguric and hyperkalemic,  on HD. Failed lasix  challenge. Nephrology following. K 5.6 today after 10g Lokelma  yesterday. Should get HD today. Mg 2.4, at goal. Unsure when last bladder scan was, will ask RN to bladder scan.   ID: Completed cefepime  and vancomycin  due to respiratory culture with enterobacter. No leukocytosis, afebrile. On diflucan  for candidemia.   Expected postop ABLA: H/H 9.7/30.2, overall stable. Not clinically significant at this time. Thrombocytopenia stable.  DVT Prophylaxis: Heparin  per pharmacy   Deconditioning: Severe deconditioning, CIR  following. Her insurance declined CIR twice prior to her 2nd surgery so not sure if they will accept this time around but it would be the best option for her. If not approved for CIR will need SNF. Continue to work with PT/OT.    Dispo: Continue current care, hopefully can d/c to CIR once medically stable   LOS: 58 days    [name] Bend, PA-C 02/29/2024  Agree Continue PT OT Likely will need PEG Dispo planning  Tishina Lown O Audriana Aldama    "

## 2024-03-01 LAB — HEPATIC FUNCTION PANEL
ALT: 8 U/L (ref 0–44)
AST: 20 U/L (ref 15–41)
Albumin: 3.6 g/dL (ref 3.5–5.0)
Alkaline Phosphatase: 290 U/L — ABNORMAL HIGH (ref 38–126)
Bilirubin, Direct: 0.5 mg/dL — ABNORMAL HIGH (ref 0.0–0.2)
Indirect Bilirubin: 0.4 mg/dL (ref 0.3–0.9)
Total Bilirubin: 0.9 mg/dL (ref 0.0–1.2)
Total Protein: 6.4 g/dL — ABNORMAL LOW (ref 6.5–8.1)

## 2024-03-01 LAB — RENAL FUNCTION PANEL
Albumin: 3.6 g/dL (ref 3.5–5.0)
Anion gap: 13 (ref 5–15)
BUN: 58 mg/dL — ABNORMAL HIGH (ref 8–23)
CO2: 27 mmol/L (ref 22–32)
Calcium: 9.1 mg/dL (ref 8.9–10.3)
Chloride: 93 mmol/L — ABNORMAL LOW (ref 98–111)
Creatinine, Ser: 2.6 mg/dL — ABNORMAL HIGH (ref 0.44–1.00)
GFR, Estimated: 19 mL/min — ABNORMAL LOW
Glucose, Bld: 148 mg/dL — ABNORMAL HIGH (ref 70–99)
Phosphorus: 4.9 mg/dL — ABNORMAL HIGH (ref 2.5–4.6)
Potassium: 4.8 mmol/L (ref 3.5–5.1)
Sodium: 133 mmol/L — ABNORMAL LOW (ref 135–145)

## 2024-03-01 LAB — COOXEMETRY PANEL
Carboxyhemoglobin: 2.6 % — ABNORMAL HIGH (ref 0.5–1.5)
Methemoglobin: <0.7 % (ref 0.0–1.5)
O2 Saturation: 64.5 %
Total hemoglobin: 10.1 g/dL — ABNORMAL LOW (ref 12.0–16.0)

## 2024-03-01 LAB — GLUCOSE, CAPILLARY
Glucose-Capillary: 149 mg/dL — ABNORMAL HIGH (ref 70–99)
Glucose-Capillary: 93 mg/dL (ref 70–99)
Glucose-Capillary: 123 mg/dL — ABNORMAL HIGH (ref 70–99)
Glucose-Capillary: 102 mg/dL — ABNORMAL HIGH (ref 70–99)
Glucose-Capillary: 161 mg/dL — ABNORMAL HIGH (ref 70–99)

## 2024-03-01 LAB — CBC
HCT: 30.6 % — ABNORMAL LOW (ref 36.0–46.0)
Hemoglobin: 9.8 g/dL — ABNORMAL LOW (ref 12.0–15.0)
MCH: 31.8 pg (ref 26.0–34.0)
MCHC: 32 g/dL (ref 30.0–36.0)
MCV: 99.4 fL (ref 80.0–100.0)
Platelets: 140 K/uL — ABNORMAL LOW (ref 150–400)
RBC: 3.08 MIL/uL — ABNORMAL LOW (ref 3.87–5.11)
RDW: 19.2 % — ABNORMAL HIGH (ref 11.5–15.5)
WBC: 4.8 K/uL (ref 4.0–10.5)
nRBC: 0 % (ref 0.0–0.2)

## 2024-03-01 LAB — HEPARIN LEVEL (UNFRACTIONATED)
Heparin Unfractionated: 0.48 [IU]/mL (ref 0.30–0.70)
Heparin Unfractionated: 0.62 [IU]/mL (ref 0.30–0.70)

## 2024-03-01 LAB — MAGNESIUM: Magnesium: 2.3 mg/dL (ref 1.7–2.4)

## 2024-03-01 MED ORDER — HEPARIN SODIUM (PORCINE) 1000 UNIT/ML IJ SOLN
INTRAMUSCULAR | Status: AC
  Filled 2024-03-01: qty 4

## 2024-03-01 NOTE — Progress Notes (Signed)
 PHARMACY - ANTICOAGULATION CONSULT NOTE  Pharmacy Consult for heparin  Indication: atrial fibrillation  Allergies[1]  Patient Measurements: Height: 5' 4 (162.6 cm) Weight: 67.3 kg (148 lb 5.9 oz) IBW/kg (Calculated) : 54.7 HEPARIN  DW (KG): 66.5  Vital Signs: Temp: 98.3 F (36.8 C) (12/27 0808) Temp Source: Oral (12/27 0808) BP: 126/68 (12/27 0808) Pulse Rate: 75 (12/27 0808)  Labs: Recent Labs    02/28/24 0355 02/29/24 0458 03/01/24 0445  HGB 10.0* 9.7* 9.8*  HCT 30.8* 30.2* 30.6*  PLT 145* 141* 140*  HEPARINUNFRC 0.35 0.35 0.48  CREATININE 2.46* 3.29* 2.60*    Estimated Creatinine Clearance: 19.5 mL/min (A) (by C-G formula based on SCr of 2.6 mg/dL (H)).   Medical History: Past Medical History:  Diagnosis Date   Medical history non-contributory    Thyroid  disease    Vertigo     Medications:  Scheduled:   amiodarone   200 mg Per Tube BID   clonazePAM   0.5 mg Per Tube QHS   feeding supplement  237 mL Oral TID BM   feeding supplement (PROSource TF20)  60 mL Per Tube Daily   feeding supplement (VITAL 1.5 CAL)  1,000 mL Per Tube Q24H   Gerhardt's butt cream   Topical BID   insulin  aspart  0-9 Units Subcutaneous Q4H   insulin  glargine  15 Units Subcutaneous Daily   mirtazapine   30 mg Per Tube QHS   mouth rinse  15 mL Mouth Rinse 4 times per day   pantoprazole   40 mg Oral Daily   QUEtiapine   25 mg Per Tube QHS   scopolamine   1 patch Transdermal Q72H   sertraline   25 mg Per Tube QHS   sodium chloride  flush  10-40 mL Intracatheter Q12H   sodium zirconium cyclosilicate   10 g Oral Daily    Assessment: Tracy Baker is a 72 YOF now s/p MV repair and redo on 12/5. Hospitalization complicated by acute RV failure with cardiogenic shock s/p Protek duo VV ECMO cannula. Patient also with atrial fibrillation throughout this hospitalization.  Discussed with CCM/HF team and decision made to start low-dose heparin  infusion 12/14. Pharmacy consulted for heparin  dosing.    Heparin  level 0.48 is on the high end of therapeutic with heparin  running at 1150 units/hr. Hgb (9.7) and PLTs (141) are stable. Per RN, no report of pauses, issues with the line, or signs of bleeding. With increase in heparin  level today, will empirically decrease rate slightly to maintain therapeutic level.    Goal of Therapy:  Heparin  level 0.3-0.5 Monitor platelets by anticoagulation protocol: Yes   Plan:  Decrease heparin  infusion to 1100 units/hr Check 8 hour heparin  level  Daily heparin  level, CBC F/u plans for Eliquis  eventually   Thank you for allowing pharmacy to be a part of this patients care.   Nidia Schaffer, PharmD PGY2 Cardiology Pharmacy Resident  Please check AMION for all [facility] Pharmacy phone numbers After 10:00 PM, call Main Pharmacy [phone] 03/01/2024    [1]  Allergies Allergen Reactions   Codeine Anaphylaxis and Nausea And Vomiting   Bactoshield Chg [Chlorhexidine  Gluconate] Dermatitis   Chlorhexidine  Dermatitis and Rash   Red Dye #40 (Allura Red) Nausea Only

## 2024-03-01 NOTE — Progress Notes (Signed)
 Received patient in bed to unit.  Alert and oriented.  Informed consent signed and in chart.   TX duration: 3.5hr  Patient tolerated well.  Transported back to the room  Alert, without acute distress.  Hand-off given to patient's nurse.   Access used: L. CVC; dressing is C, D, and I.  Access issues: N/A  Total UF removed: Medication(s) given: N/A Post HD VS: 97.4 oral, BP 127/79, HR 85 Post HD weight: 64.3kg  03/01/24 1638  Vitals  Temp (!) 97.4 F (36.3 C)  BP 127/79  MAP (mmHg) 90  Pulse Rate 85  ECG Heart Rate 85  Resp 14  Weight 64.3 kg (bed weight)  Type of Weight Post-Dialysis  Oxygen Therapy  SpO2 95 %  O2 Device Room Air  Patient Activity (if Appropriate) In bed  Pulse Oximetry Type Continuous  Oximetry Probe Site Changed No  During Treatment Monitoring  Blood Flow Rate (mL/min) 0 mL/min  Arterial Pressure (mmHg) -1.41 mmHg  Venous Pressure (mmHg) -2.63 mmHg  TMP (mmHg) 22.63 mmHg  Ultrafiltration Rate (mL/min) 1053 mL/min  Dialysate Flow Rate (mL/min) 300 ml/min  Dialysate Potassium Concentration 2  Dialysate Calcium  Concentration 2.5  Duration of HD Treatment -hour(s) 3.5 hour(s)  Cumulative Fluid Removed (mL) per Treatment  3000.28  Post Treatment  Dialyzer Clearance Lightly streaked  Liters Processed 63  Fluid Removed (mL) 3000 mL  Tolerated HD Treatment Yes     Carlyon Rhein, RN [facility]

## 2024-03-01 NOTE — Progress Notes (Addendum)
 21 Days Post-Op Procedures (LRB): ECMO CANNULATION (N/A) Subjective: Feels a little better  Objective: Vital signs in last 24 hours: Temp:  [97.5 F (36.4 C)-98.4 F (36.9 C)] 98.3 F (36.8 C) (12/27 0808) Pulse Rate:  [67-93] 75 (12/27 0808) Cardiac Rhythm: Heart block;Bundle branch block (12/27 0700) Resp:  [11-21] 11 (12/27 0808) BP: (98-126)/(62-76) 126/68 (12/27 0808) SpO2:  [94 %-99 %] 95 % (12/27 0808) Weight:  [67.3 kg-70.9 kg] 67.3 kg (12/27 0606)  Hemodynamic parameters for last 24 hours:    Intake/Output from previous day: 12/26 0701 - 12/27 0700 In: 4580.8 [P.O.:240; I.V.:648; NG/GT:2253.8; IV Piggyback:1439] Out: 3000  Intake/Output this shift: No intake/output data recorded.  General appearance: alert, cooperative, fatigued, and no distress Heart: regular rate and rhythm Lungs: mildly dim in bases Abdomen: benign Extremities: no edema Wound: incis healing well without signs of infection  Lab Results: Recent Labs    02/29/24 0458 03/01/24 0445  WBC 5.1 4.8  HGB 9.7* 9.8*  HCT 30.2* 30.6*  PLT 141* 140*   BMET:  Recent Labs    02/29/24 0458 03/01/24 0445  NA 132* 133*  K 5.6* 4.8  CL 93* 93*  CO2 25 27  GLUCOSE 148* 148*  BUN 88* 58*  CREATININE 3.29* 2.60*  CALCIUM  9.3 9.1    PT/INR: No results for input(s): LABPROT, INR in the last 72 hours. ABG    Component Value Date/Time   PHART 7.340 (L) 02/13/2024 1618   HCO3 18.0 (L) 02/13/2024 1618   TCO2 19 (L) 02/13/2024 1618   ACIDBASEDEF 7.0 (H) 02/13/2024 1618   O2SAT 64.5 03/01/2024 0450   CBG (last 3)  Recent Labs    02/29/24 2334 03/01/24 0334 03/01/24 0805  GLUCAP 145* 149* 93    Meds Scheduled Meds:  amiodarone   200 mg Per Tube BID   clonazePAM   0.5 mg Per Tube QHS   feeding supplement  237 mL Oral TID BM   feeding supplement (PROSource TF20)  60 mL Per Tube Daily   feeding supplement (VITAL 1.5 CAL)  1,000 mL Per Tube Q24H   Gerhardt's butt cream   Topical BID    insulin  aspart  0-9 Units Subcutaneous Q4H   insulin  glargine  15 Units Subcutaneous Daily   mirtazapine   30 mg Per Tube QHS   mouth rinse  15 mL Mouth Rinse 4 times per day   pantoprazole   40 mg Oral Daily   QUEtiapine   25 mg Per Tube QHS   scopolamine   1 patch Transdermal Q72H   sertraline   25 mg Per Tube QHS   sodium chloride  flush  10-40 mL Intracatheter Q12H   sodium zirconium cyclosilicate   10 g Oral Daily   Continuous Infusions:  anticoagulant sodium citrate      DOBUTamine  2 mcg/kg/min (03/01/24 0458)   fluconazole  (DIFLUCAN ) IV Stopped (02/29/24 2008)   heparin  1,150 Units/hr (03/01/24 0458)   promethazine  (PHENERGAN ) injection (IM or IVPB) Stopped (02/28/24 2315)   PRN Meds:.acetaminophen , albuterol , alteplase , anticoagulant sodium citrate , antiseptic oral rinse, heparin , lidocaine  (PF), lidocaine -prilocaine , mouth rinse, pentafluoroprop-tetrafluoroeth, polyethylene glycol, promethazine  (PHENERGAN ) injection (IM or IVPB), simethicone , sodium chloride  flush  Xrays No results found.  Assessment/Plan: S/P Procedures (LRB): ECMO CANNULATION (N/A)  1 afeb, VSS, has had rate controlled  afib/ previous SSS, surrently in SR, on heparin  gtt , low dose dobutamine  to support volume removal- co-ox 64.5 - AHF has been managing 2 O2 sats good on RA 3 min UOP- conts HD and nephrology management of renal issues, volume has  remained an issue 4 ID-conts long term fluconazole  for candida endocarditis, completed cefipime/vanco course for enterobacter,  - no leukocytosis 5 significant malnutrition and deconditioning- conts TF's ,changed to nocturnal, push po as able,  discussions underway for PEG but patient reluctant 6 BS adeq control- lantus /SSI cont while on TF's 7 minor hyponatremia- being addressed w/ HD 8 anemia- multifactorial - stable 9 thrombocytopenia - stable 10- CIR is evaluating for poss admission- cont therapies 11 on seroquel  and sertraline , situational depression     LOS:  59 days    Lemond FORBES Cera PA-C Pager [phone] 03/01/2024   Chart reviewed, patient examined, agree with above.  Dobut 2 mcg per AHF team. Wt is gradually coming down with HD.  Continue to encourage po nutrition, night time tube feeds. She does not want a PEG.

## 2024-03-01 NOTE — Progress Notes (Signed)
 " [facility] Progress Note    Assessment/ Plan:   AKI -b/l Cr 0.4-0.8. 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  Appreciate help -will cont to monitor for any recovery  Tolerated HD on 12/26 with 3L net UF  Plan on hd again today to help offload; appears that her ~EDW ~61-62kg.  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 On antifungal therapy -on dobuatmine-- wean as able (still on)  AHRF -extubated on 12/15 -currently stable -UF as tolerated with HD  Anemia -transfuse PRN for Hgb <7; Hb 9.8 -not candidate for esa with infection  Hyponatremia: mild and intermittent.  Manage volume with dialysis  Hyperkalemia: 2/2 ARF. Mgmt w/ HD as above   Subjective:   Patient tired but no complaints; denies n/v. NGT feeds, poor appetite. Co-ox 64.5   Objective:   BP 122/73 (BP Location: Left Arm)   Pulse 75   Temp 98.3 F (36.8 C) (Oral)   Resp 13   Ht 5' 4 (1.626 m)   Wt 67.3 kg   SpO2 95%   BMI 25.47 kg/m   Intake/Output Summary (Last 24 hours) at 03/01/2024 0704 Last data filed at 03/01/2024 0458 Gross per 24 hour  Intake 2579.23 ml  Output 3000 ml  Net -420.77 ml   Weight change: 0 kg  Physical Exam: Gen: ill/tired appearing, NAD CVS: normal rate, no rub Resp: bl chest rise, normal wob Abd: soft Ext: trace edema at the bilateral ankles Neuro: awake, alert, drowsy Dialysis access: LIJ tunneled  HD cath  Imaging: No results found.     Labs: BMET Recent Labs  Lab 02/24/24 0514 02/25/24 0527 02/26/24 0423 02/27/24 0500 02/28/24 0355 02/29/24 0458 03/01/24 0445  NA 133* 132* 135 133* 133* 132* 133*  K 4.7 5.1 4.8 5.5* 5.2* 5.6* 4.8  CL 94* 94* 95* 94* 94* 93* 93*  CO2 24 24 27 24 27 25 27   GLUCOSE 195* 194* 146* 166* 152* 148* 148*  BUN 61* 91* 58* 86* 60* 88* 58*  CREATININE 2.19*  3.03* 2.45* 3.38* 2.46* 3.29* 2.60*  CALCIUM  8.9 9.0 9.3 9.4 9.1 9.3 9.1  PHOS 3.8 4.5 4.2 4.7* 4.2 5.3* 4.9*   CBC Recent Labs  Lab 02/27/24 0500 02/28/24 0355 02/29/24 0458 03/01/24 0445  WBC 5.4 5.0 5.1 4.8  HGB 9.9* 10.0* 9.7* 9.8*  HCT 30.4* 30.8* 30.2* 30.6*  MCV 97.7 98.4 99.3 99.4  PLT 148* 145* 141* 140*    Medications:     amiodarone   200 mg Per Tube BID   clonazePAM   0.5 mg Per Tube QHS   feeding supplement  237 mL Oral TID BM   feeding supplement (PROSource TF20)  60 mL Per Tube Daily   feeding supplement (VITAL 1.5 CAL)  1,000 mL Per Tube Q24H   Gerhardt's butt cream   Topical BID   insulin  aspart  0-9 Units Subcutaneous Q4H   insulin  glargine  15 Units Subcutaneous Daily   mirtazapine   30 mg Per Tube QHS   mouth rinse  15 mL Mouth Rinse 4 times per day   pantoprazole   40 mg Oral Daily   QUEtiapine   25 mg Per Tube QHS   scopolamine   1 patch Transdermal Q72H   sertraline   25 mg Per Tube QHS   sodium chloride  flush  10-40 mL Intracatheter Q12H  sodium zirconium cyclosilicate   10 g Oral Daily       "

## 2024-03-01 NOTE — TOC Progression Note (Signed)
 Transition of Care [name]) - Progression Note    Patient Details  Name: Tracy Baker MRN: [id number] Date of Birth: 08/16/1955  [facility] CM/SW Contact  Vonita Calloway B Ivery Nanney, LCSWA Phone Number: 03/01/2024, 12:30 PM  Clinical Narrative:     Bed offers provided to pt, pt states she will go over the offers with her children, ICM will continue to follow.    Expected Discharge Plan: Skilled Nursing Facility Barriers to Discharge: Continued Medical Work up               Expected Discharge Plan and Services   Discharge Planning Services: CM Consult Post Acute Care Choice: Skilled Nursing Facility Living arrangements for the past 2 months: Apartment                                       Social Drivers of Health (SDOH) Interventions SDOH Screenings   Food Insecurity: No Food Insecurity (12/29/2023)  Housing: Low Risk (12/29/2023)  Transportation Needs: No Transportation Needs (12/29/2023)  Utilities: Not At Risk (12/29/2023)  Alcohol Screen: Low Risk (06/08/2022)  Depression (PHQ2-9): Low Risk (11/08/2023)  Social Connections: Socially Isolated (12/29/2023)  Tobacco Use: High Risk (02/23/2024)    Readmission Risk Interventions     No data to display

## 2024-03-01 NOTE — Progress Notes (Signed)
 PHARMACY - ANTICOAGULATION CONSULT NOTE  Pharmacy Consult for heparin  Indication: atrial fibrillation  Allergies[1]  Patient Measurements: Height: 5' 4 (162.6 cm) Weight: 64.3 kg (141 lb 12.1 oz) (bed weight) IBW/kg (Calculated) : 54.7 HEPARIN  DW (KG): 66.5  Vital Signs: Temp: 97.9 F (36.6 C) (12/27 1715) Temp Source: Oral (12/27 1715) BP: 118/68 (12/27 1715) Pulse Rate: 85 (12/27 1715)  Labs: Recent Labs    02/28/24 0355 02/29/24 0458 03/01/24 0445 03/01/24 1826  HGB 10.0* 9.7* 9.8*  --   HCT 30.8* 30.2* 30.6*  --   PLT 145* 141* 140*  --   HEPARINUNFRC 0.35 0.35 0.48 0.62  CREATININE 2.46* 3.29* 2.60*  --     Estimated Creatinine Clearance: 17.9 mL/min (A) (by C-G formula based on SCr of 2.6 mg/dL (H)).   Medical History: Past Medical History:  Diagnosis Date   Medical history non-contributory    Thyroid  disease    Vertigo     Medications:  Scheduled:   amiodarone   200 mg Per Tube BID   clonazePAM   0.5 mg Per Tube QHS   feeding supplement  237 mL Oral TID BM   feeding supplement (PROSource TF20)  60 mL Per Tube Daily   feeding supplement (VITAL 1.5 CAL)  1,000 mL Per Tube Q24H   Gerhardt's butt cream   Topical BID   insulin  aspart  0-9 Units Subcutaneous Q4H   insulin  glargine  15 Units Subcutaneous Daily   mirtazapine   30 mg Per Tube QHS   mouth rinse  15 mL Mouth Rinse 4 times per day   pantoprazole   40 mg Oral Daily   QUEtiapine   25 mg Per Tube QHS   scopolamine   1 patch Transdermal Q72H   sertraline   25 mg Per Tube QHS   sodium chloride  flush  10-40 mL Intracatheter Q12H    Assessment: Tracy Baker is a 24 YOF now s/p MV repair and redo on 12/5. Hospitalization complicated by acute RV failure with cardiogenic shock s/p Protek duo VV ECMO cannula. Patient also with atrial fibrillation throughout this hospitalization.  Discussed with CCM/HF team and decision made to start low-dose heparin  infusion 12/14. Pharmacy consulted for heparin  dosing.    Heparin  level is slightly high at 0.62, on 1100 units/hr. No s/sx of bleeding or infusion issues.    Goal of Therapy:  Heparin  level 0.3-0.5 Monitor platelets by anticoagulation protocol: Yes   Plan:  Decrease heparin  infusion to 1000 units/hr Check 8 hour heparin  level with AM labs Daily heparin  level, CBC F/u plans for Eliquis  eventually  Thank you for allowing pharmacy to participate in this patient's care,  Suzen Sour, PharmD, BCCCP Clinical Pharmacist  Phone: [phone] 03/01/2024 7:55 PM  Please check AMION for all [facility] Pharmacy phone numbers After 10:00 PM, call Main Pharmacy [phone]      [1]  Allergies Allergen Reactions   Codeine Anaphylaxis and Nausea And Vomiting   Bactoshield Chg [Chlorhexidine  Gluconate] Dermatitis   Chlorhexidine  Dermatitis and Rash   Red Dye #40 (Allura Red) Nausea Only

## 2024-03-01 NOTE — Progress Notes (Signed)
 Speech Language Pathology Treatment: Dysphagia  Patient Details Name: Tracy Baker MRN: [id number] DOB: May 31, 1955 Today's Date: 03/01/2024 Time: 9184-9164 SLP Time Calculation (min) (ACUTE ONLY): 20 min  Assessment / Plan / Recommendation Clinical Impression  Recommend continue Regular/thin liquids with pt preferred foods for increased satiety; pt has Cortrak for nutrition/hydration purposes, but encouraged to continue po intake to maintain swallowing accuracy/efficiency during acute stay.  ST will s/o at this time d/t pt meeting stated goals.  Tracy Baker was seen for dysphagia tx session with current diet of Regular/thin liquids with min verbal cues for swallowing precautions initially, but min intake observed d/t pt decreased satiety/fatigue factor.  Pt consumed mixed regular consistency (fruit cup)/thin liquids via cup sips with adequate oral manipulation, timely swallow and without overt s/s of aspiration observed during trial.  She becomes fatigued easily and declined further po intake this session.  Pt has Cortrak in place for nutritive/hydration purposes, but continued po intake recommended to maintain swallowing integrity.  HPI HPI: Tracy Baker admitted 10/25 after leaving AMA from [facility] where she was being treated for C. Albicans candidemia. TEE with mitral valve vegetation 10/28. Dental extractions 11/3. Mitral valve repair, ETT <24 hrs 11/6-11/7 (passed [facility] post-extubation). Redo sternotomy with MVR 12/5, developed R ventricular failure 12/6, requiring ECMO cannulation 12/6-12/8. Remained intubated post-operatively 12/5-12/15. CRRT 12/13-12/16. PMH includes thyroid  disease. ST f/u for diet tolerance for Regular/thin liquid diet; pt has Cortrak to supplement nutrition.      SLP Plan  Discharge SLP treatment due to (comment) (goals met)        Swallow Evaluation Recommendations   Recommendations: PO diet PO Diet Recommendation: Regular;Thin liquids (Level 0) Liquid  Administration via: Cup;Straw Medication Administration: Whole meds with liquid Supervision: Staff to assist with self-feeding Postural changes: Position pt fully upright for meals Oral care recommendations: Oral care BID (2x/day)     Recommendations   PO diet (Regular/thin liquids) with general swallowing precautions                  Oral care BID   Frequent or constant Supervision/Assistance Dysphagia, pharyngeal phase (R13.13)     Discharge SLP treatment due to (comment) (goals met)     Tracy Baker,M.S.,CCC-SLP  03/01/2024, 9:38 AM

## 2024-03-02 LAB — CBC
HCT: 31.6 % — ABNORMAL LOW (ref 36.0–46.0)
Hemoglobin: 10.1 g/dL — ABNORMAL LOW (ref 12.0–15.0)
MCH: 31.7 pg (ref 26.0–34.0)
MCHC: 32 g/dL (ref 30.0–36.0)
MCV: 99.1 fL (ref 80.0–100.0)
Platelets: 147 K/uL — ABNORMAL LOW (ref 150–400)
RBC: 3.19 MIL/uL — ABNORMAL LOW (ref 3.87–5.11)
RDW: 18.6 % — ABNORMAL HIGH (ref 11.5–15.5)
WBC: 4.4 K/uL (ref 4.0–10.5)
nRBC: 0 % (ref 0.0–0.2)

## 2024-03-02 LAB — RENAL FUNCTION PANEL
Albumin: 3.7 g/dL (ref 3.5–5.0)
Anion gap: 12 (ref 5–15)
BUN: 44 mg/dL — ABNORMAL HIGH (ref 8–23)
CO2: 28 mmol/L (ref 22–32)
Calcium: 9.3 mg/dL (ref 8.9–10.3)
Chloride: 92 mmol/L — ABNORMAL LOW (ref 98–111)
Creatinine, Ser: 2.25 mg/dL — ABNORMAL HIGH (ref 0.44–1.00)
GFR, Estimated: 23 mL/min — ABNORMAL LOW
Glucose, Bld: 160 mg/dL — ABNORMAL HIGH (ref 70–99)
Phosphorus: 4.2 mg/dL (ref 2.5–4.6)
Potassium: 4.2 mmol/L (ref 3.5–5.1)
Sodium: 133 mmol/L — ABNORMAL LOW (ref 135–145)

## 2024-03-02 LAB — GLUCOSE, CAPILLARY
Glucose-Capillary: 101 mg/dL — ABNORMAL HIGH (ref 70–99)
Glucose-Capillary: 103 mg/dL — ABNORMAL HIGH (ref 70–99)
Glucose-Capillary: 145 mg/dL — ABNORMAL HIGH (ref 70–99)
Glucose-Capillary: 149 mg/dL — ABNORMAL HIGH (ref 70–99)
Glucose-Capillary: 152 mg/dL — ABNORMAL HIGH (ref 70–99)
Glucose-Capillary: 164 mg/dL — ABNORMAL HIGH (ref 70–99)
Glucose-Capillary: 172 mg/dL — ABNORMAL HIGH (ref 70–99)

## 2024-03-02 LAB — HEPATIC FUNCTION PANEL
ALT: 9 U/L (ref 0–44)
AST: 19 U/L (ref 15–41)
Albumin: 3.8 g/dL (ref 3.5–5.0)
Alkaline Phosphatase: 277 U/L — ABNORMAL HIGH (ref 38–126)
Bilirubin, Direct: 0.5 mg/dL — ABNORMAL HIGH (ref 0.0–0.2)
Indirect Bilirubin: 0.4 mg/dL (ref 0.3–0.9)
Total Bilirubin: 0.9 mg/dL (ref 0.0–1.2)
Total Protein: 6.6 g/dL (ref 6.5–8.1)

## 2024-03-02 LAB — HEPARIN LEVEL (UNFRACTIONATED)
Heparin Unfractionated: 0.23 [IU]/mL — ABNORMAL LOW (ref 0.30–0.70)
Heparin Unfractionated: 0.36 [IU]/mL (ref 0.30–0.70)

## 2024-03-02 LAB — COOXEMETRY PANEL
Carboxyhemoglobin: 2.4 % — ABNORMAL HIGH (ref 0.5–1.5)
Methemoglobin: <0.7 % (ref 0.0–1.5)
O2 Saturation: 66.1 %
Total hemoglobin: 10.4 g/dL — ABNORMAL LOW (ref 12.0–16.0)

## 2024-03-02 LAB — MAGNESIUM: Magnesium: 2.1 mg/dL (ref 1.7–2.4)

## 2024-03-02 NOTE — Progress Notes (Signed)
 PHARMACY - ANTICOAGULATION CONSULT NOTE  Pharmacy Consult for heparin  Indication: atrial fibrillation  Allergies[1]  Patient Measurements: Height: 5' 4 (162.6 cm) Weight: 65.5 kg (144 lb 6.4 oz) IBW/kg (Calculated) : 54.7 HEPARIN  DW (KG): 66.5  Vital Signs: Temp: 98.5 F (36.9 C) (12/28 1923) Temp Source: Oral (12/28 1923) BP: 117/68 (12/28 1923) Pulse Rate: 79 (12/28 1923)  Labs: Recent Labs    02/29/24 0458 03/01/24 0445 03/01/24 1826 03/02/24 0440 03/02/24 2000  HGB 9.7* 9.8*  --  10.1*  --   HCT 30.2* 30.6*  --  31.6*  --   PLT 141* 140*  --  147*  --   HEPARINUNFRC 0.35 0.48 0.62 0.23* 0.36  CREATININE 3.29* 2.60*  --  2.25*  --     Estimated Creatinine Clearance: 20.7 mL/min (A) (by C-G formula based on SCr of 2.25 mg/dL (H)).   Medical History: Past Medical History:  Diagnosis Date   Medical history non-contributory    Thyroid  disease    Vertigo     Medications:  Scheduled:   amiodarone   200 mg Per Tube BID   clonazePAM   0.5 mg Per Tube QHS   feeding supplement  237 mL Oral TID BM   feeding supplement (PROSource TF20)  60 mL Per Tube Daily   feeding supplement (VITAL 1.5 CAL)  1,000 mL Per Tube Q24H   Gerhardt's butt cream   Topical BID   insulin  aspart  0-9 Units Subcutaneous Q4H   insulin  glargine  15 Units Subcutaneous Daily   mirtazapine   30 mg Per Tube QHS   mouth rinse  15 mL Mouth Rinse 4 times per day   pantoprazole   40 mg Oral Daily   QUEtiapine   25 mg Per Tube QHS   scopolamine   1 patch Transdermal Q72H   sertraline   25 mg Per Tube QHS   sodium chloride  flush  10-40 mL Intracatheter Q12H    Assessment: Tracy Baker is a 63 YOF now s/p MV repair and redo on 12/5. Hospitalization complicated by acute RV failure with cardiogenic shock s/p Protek duo VV ECMO cannula. Patient also with atrial fibrillation throughout this hospitalization.  Discussed with CCM/HF team and decision made to start low-dose heparin  infusion 12/14. Pharmacy  consulted for heparin  dosing.   Heparin  level came back therapeutic at 0.36, on 1150 units/hr. No s/sx of bleeding or infusion issues.   Goal of Therapy:  Heparin  level 0.3-0.5 Monitor platelets by anticoagulation protocol: Yes   Plan:  Continue heparin  infusion to 1150 units/hr Check 8 hour heparin  level with AM labs  Daily heparin  level, CBC F/u plans for Eliquis  eventually  Thank you for allowing pharmacy to participate in this patient's care,  Tracy Baker, PharmD, BCCCP Clinical Pharmacist  Phone: [phone] 03/02/2024 9:54 PM  Please check AMION for all [facility] Pharmacy phone numbers After 10:00 PM, call Main Pharmacy [phone]      [1]  Allergies Allergen Reactions   Codeine Anaphylaxis and Nausea And Vomiting   Bactoshield Chg [Chlorhexidine  Gluconate] Dermatitis   Chlorhexidine  Dermatitis and Rash   Red Dye #40 (Allura Red) Nausea Only

## 2024-03-02 NOTE — Plan of Care (Signed)

## 2024-03-02 NOTE — Progress Notes (Signed)
 PHARMACY - ANTICOAGULATION CONSULT NOTE  Pharmacy Consult for heparin  Indication: atrial fibrillation  Allergies[1]  Patient Measurements: Height: 5' 4 (162.6 cm) Weight: 65.5 kg (144 lb 6.4 oz) IBW/kg (Calculated) : 54.7 HEPARIN  DW (KG): 66.5  Vital Signs: Temp: 98 F (36.7 C) (12/28 1113) Temp Source: Oral (12/28 1113) BP: 123/67 (12/28 1113) Pulse Rate: 74 (12/28 1113)  Labs: Recent Labs    02/29/24 0458 03/01/24 0445 03/01/24 1826 03/02/24 0440  HGB 9.7* 9.8*  --  10.1*  HCT 30.2* 30.6*  --  31.6*  PLT 141* 140*  --  147*  HEPARINUNFRC 0.35 0.48 0.62 0.23*  CREATININE 3.29* 2.60*  --  2.25*    Estimated Creatinine Clearance: 20.7 mL/min (A) (by C-G formula based on SCr of 2.25 mg/dL (H)).   Medical History: Past Medical History:  Diagnosis Date   Medical history non-contributory    Thyroid  disease    Vertigo     Medications:  Scheduled:   amiodarone   200 mg Per Tube BID   clonazePAM   0.5 mg Per Tube QHS   feeding supplement  237 mL Oral TID BM   feeding supplement (PROSource TF20)  60 mL Per Tube Daily   feeding supplement (VITAL 1.5 CAL)  1,000 mL Per Tube Q24H   Gerhardt's butt cream   Topical BID   insulin  aspart  0-9 Units Subcutaneous Q4H   insulin  glargine  15 Units Subcutaneous Daily   mirtazapine   30 mg Per Tube QHS   mouth rinse  15 mL Mouth Rinse 4 times per day   pantoprazole   40 mg Oral Daily   QUEtiapine   25 mg Per Tube QHS   scopolamine   1 patch Transdermal Q72H   sertraline   25 mg Per Tube QHS   sodium chloride  flush  10-40 mL Intracatheter Q12H    Assessment: Tracy Baker is a 16 YOF now s/p MV repair and redo on 12/5. Hospitalization complicated by acute RV failure with cardiogenic shock s/p Protek duo VV ECMO cannula. Patient also with atrial fibrillation throughout this hospitalization.  Discussed with CCM/HF team and decision made to start low-dose heparin  infusion 12/14. Pharmacy consulted for heparin  dosing.   Heparin  level  0.23 is subtherapeutic with heparin  running at 1000 units/hr. Hgb (10.1) and PLTs (147) are stable. Per RN, no report of pauses, issues with the line, or signs of bleeding.     Goal of Therapy:  Heparin  level 0.3-0.5 Monitor platelets by anticoagulation protocol: Yes   Plan:  Increase heparin  infusion to 1150 units/hr Check 8 hour heparin  level Daily heparin  level, CBC F/u plans for Eliquis  eventually   Thank you for allowing pharmacy to be a part of this patients care.   Nidia Schaffer, PharmD PGY2 Cardiology Pharmacy Resident  Please check AMION for all [facility] Pharmacy phone numbers After 10:00 PM, call Main Pharmacy [phone] 03/02/2024       [1]  Allergies Allergen Reactions   Codeine Anaphylaxis and Nausea And Vomiting   Bactoshield Chg [Chlorhexidine  Gluconate] Dermatitis   Chlorhexidine  Dermatitis and Rash   Red Dye #40 (Allura Red) Nausea Only

## 2024-03-02 NOTE — Progress Notes (Signed)
 " [facility] Progress Note    Assessment/ Plan:   AKI -b/l Cr 0.4-0.8. 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  Appreciate help -will cont to monitor for any recovery  Tolerated HD on 12/26 with 3L net UF, 12/27 3L  Will plan on treatment Monday and we should be close to her  ~EDW ~61-62kg. Then can evaluate daily for any signs of recover with MWF HD plan as needed.  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 On antifungal therapy  AHRF -extubated on 12/15 -currently stable -UF as tolerated with HD  Anemia -transfuse PRN for Hgb <7; Hb 9.8 -not candidate for esa with infection  Hyponatremia: mild and intermittent.  Manage volume with dialysis  Hyperkalemia: 2/2 ARF. Mgmt w/ HD as above   Subjective:   Patient tired but no complaints; denies n/v. NGT feeds, poor appetite. Co-ox 66.1   Objective:   BP 123/67 (BP Location: Left Arm)   Pulse 74   Temp 98 F (36.7 C) (Oral)   Resp 15   Ht 5' 4 (1.626 m)   Wt 65.5 kg   SpO2 95%   BMI 24.79 kg/m   Intake/Output Summary (Last 24 hours) at 03/02/2024 1127 Last data filed at 03/02/2024 0400 Gross per 24 hour  Intake 2399.34 ml  Output 3050 ml  Net -650.66 ml   Weight change: -3.6 kg  Physical Exam: Gen: ill/tired appearing, NAD CVS: normal rate, no rub Resp: bl chest rise, normal wob Abd: soft Ext: trace edema at the bilateral ankles Neuro: awake, alert, drowsy Dialysis access: LIJ tunneled  HD cath  Imaging: No results found.     Labs: BMET Recent Labs  Lab 02/25/24 0527 02/26/24 0423 02/27/24 0500 02/28/24 0355 02/29/24 0458 03/01/24 0445 03/02/24 0440  NA 132* 135 133* 133* 132* 133* 133*  K 5.1 4.8 5.5* 5.2* 5.6* 4.8 4.2  CL 94* 95* 94* 94* 93* 93* 92*  CO2 24 27 24 27 25 27 28   GLUCOSE 194* 146* 166* 152* 148* 148* 160*  BUN 91* 58* 86*  60* 88* 58* 44*  CREATININE 3.03* 2.45* 3.38* 2.46* 3.29* 2.60* 2.25*  CALCIUM  9.0 9.3 9.4 9.1 9.3 9.1 9.3  PHOS 4.5 4.2 4.7* 4.2 5.3* 4.9* 4.2   CBC Recent Labs  Lab 02/28/24 0355 02/29/24 0458 03/01/24 0445 03/02/24 0440  WBC 5.0 5.1 4.8 4.4  HGB 10.0* 9.7* 9.8* 10.1*  HCT 30.8* 30.2* 30.6* 31.6*  MCV 98.4 99.3 99.4 99.1  PLT 145* 141* 140* 147*    Medications:     amiodarone   200 mg Per Tube BID   clonazePAM   0.5 mg Per Tube QHS   feeding supplement  237 mL Oral TID BM   feeding supplement (PROSource TF20)  60 mL Per Tube Daily   feeding supplement (VITAL 1.5 CAL)  1,000 mL Per Tube Q24H   Gerhardt's butt cream   Topical BID   insulin  aspart  0-9 Units Subcutaneous Q4H   insulin  glargine  15 Units Subcutaneous Daily   mirtazapine   30 mg Per Tube QHS   mouth rinse  15 mL Mouth Rinse 4 times per day   pantoprazole   40 mg Oral Daily   QUEtiapine   25 mg Per Tube QHS   scopolamine   1 patch Transdermal Q72H   sertraline   25 mg Per Tube QHS  sodium chloride  flush  10-40 mL Intracatheter Q12H       "

## 2024-03-02 NOTE — Progress Notes (Addendum)
 22 Days Post-Op Procedures (LRB): ECMO CANNULATION (N/A) Subjective: Slept better, feels like she's eating better  Objective: Vital signs in last 24 hours: Temp:  [97.4 F (36.3 C)-98.3 F (36.8 C)] 98.1 F (36.7 C) (12/28 0726) Pulse Rate:  [67-86] 75 (12/28 0726) Cardiac Rhythm: Normal sinus rhythm (12/27 2000) Resp:  [11-20] 15 (12/28 0400) BP: (102-128)/(63-89) 107/63 (12/28 0400) SpO2:  [94 %-100 %] 95 % (12/28 0726) Weight:  [64.3 kg-67.3 kg] 65.5 kg (12/28 0612)  Hemodynamic parameters for last 24 hours:    Intake/Output from previous day: 12/27 0701 - 12/28 0700 In: 2639.6 [P.O.:300; I.V.:292.1; WH/HU:8152.4; IV Piggyback:200] Out: 3050 [Urine:50] Intake/Output this shift: No intake/output data recorded.  General appearance: alert, cooperative, and no distress Heart: regular rate and rhythm Lungs: mildly dim in bases Abdomen: benign Extremities: no edema Wound: incis healing well  Lab Results: Recent Labs    03/01/24 0445 03/02/24 0440  WBC 4.8 4.4  HGB 9.8* 10.1*  HCT 30.6* 31.6*  PLT 140* 147*   BMET:  Recent Labs    03/01/24 0445 03/02/24 0440  NA 133* 133*  K 4.8 4.2  CL 93* 92*  CO2 27 28  GLUCOSE 148* 160*  BUN 58* 44*  CREATININE 2.60* 2.25*  CALCIUM  9.1 9.3    PT/INR: No results for input(s): LABPROT, INR in the last 72 hours. ABG    Component Value Date/Time   PHART 7.340 (L) 02/13/2024 1618   HCO3 18.0 (L) 02/13/2024 1618   TCO2 19 (L) 02/13/2024 1618   ACIDBASEDEF 7.0 (H) 02/13/2024 1618   O2SAT 66.1 03/02/2024 0428   CBG (last 3)  Recent Labs    03/01/24 1949 03/02/24 0015 03/02/24 0434  GLUCAP 161* 145* 152*    Meds Scheduled Meds:  amiodarone   200 mg Per Tube BID   clonazePAM   0.5 mg Per Tube QHS   feeding supplement  237 mL Oral TID BM   feeding supplement (PROSource TF20)  60 mL Per Tube Daily   feeding supplement (VITAL 1.5 CAL)  1,000 mL Per Tube Q24H   Gerhardt's butt cream   Topical BID   insulin   aspart  0-9 Units Subcutaneous Q4H   insulin  glargine  15 Units Subcutaneous Daily   mirtazapine   30 mg Per Tube QHS   mouth rinse  15 mL Mouth Rinse 4 times per day   pantoprazole   40 mg Oral Daily   QUEtiapine   25 mg Per Tube QHS   scopolamine   1 patch Transdermal Q72H   sertraline   25 mg Per Tube QHS   sodium chloride  flush  10-40 mL Intracatheter Q12H   Continuous Infusions:  anticoagulant sodium citrate      DOBUTamine  2 mcg/kg/min (03/02/24 0400)   fluconazole  (DIFLUCAN ) IV Stopped (03/01/24 2031)   heparin  1,000 Units/hr (03/02/24 0400)   promethazine  (PHENERGAN ) injection (IM or IVPB) Stopped (02/28/24 2315)   PRN Meds:.acetaminophen , albuterol , alteplase , anticoagulant sodium citrate , antiseptic oral rinse, heparin , lidocaine  (PF), lidocaine -prilocaine , mouth rinse, pentafluoroprop-tetrafluoroeth, polyethylene glycol, promethazine  (PHENERGAN ) injection (IM or IVPB), simethicone , sodium chloride  flush  Xrays No results found.  Assessment/Plan: S/P Procedures (LRB): ECMO CANNULATION (N/A)  1 afeb, VSS, SR,on amio for post op afib,  conts dobut - AHF managing- conts hep gtt till further decision on PEG- patient and family cont to hope to avoid but do understand it may be necessary. Co-Ox 66 2 O2 sats good on RA 3 remains oliguric, on HD, nephrology managing renal issues 4 Conts TF's - nighttime- appetite and po intake  are variable still, will eat food she likes , nausea and dry mouth have been an issue as well- improving, cal count being done 5 BS adequate control on TF's, no diabetic meds preop, borderline DM 6 ID, same management- no Leukocytosis or fevers 7 H/H remain stable, multifactorial anemia 8 thrombocytopenia remains stable 9 CIR authorization being worked on 10 - conts therapies   LOS: 60 days    Lemond FORBES Cera PA-C Pager [phone] 03/02/2024   Chart reviewed, patient examined, agree with above.  Still on dobut 2 per AHF with Co-ox 66%. Hemodynamics  stable.  Continue to encourage po diet, mobilization. CIR planning to pursue insurance approval again.

## 2024-03-03 DIAGNOSIS — B377 Candidal sepsis: Secondary | ICD-10-CM | POA: Diagnosis not present

## 2024-03-03 LAB — HEPATIC FUNCTION PANEL
ALT: 10 U/L (ref 0–44)
AST: 16 U/L (ref 15–41)
Albumin: 3.7 g/dL (ref 3.5–5.0)
Alkaline Phosphatase: 279 U/L — ABNORMAL HIGH (ref 38–126)
Bilirubin, Direct: 0.5 mg/dL — ABNORMAL HIGH (ref 0.0–0.2)
Indirect Bilirubin: 0.3 mg/dL (ref 0.3–0.9)
Total Bilirubin: 0.8 mg/dL (ref 0.0–1.2)
Total Protein: 6.3 g/dL — ABNORMAL LOW (ref 6.5–8.1)

## 2024-03-03 LAB — RENAL FUNCTION PANEL
Albumin: 3.7 g/dL (ref 3.5–5.0)
Anion gap: 15 (ref 5–15)
BUN: 80 mg/dL — ABNORMAL HIGH (ref 8–23)
CO2: 25 mmol/L (ref 22–32)
Calcium: 9.3 mg/dL (ref 8.9–10.3)
Chloride: 93 mmol/L — ABNORMAL LOW (ref 98–111)
Creatinine, Ser: 3.24 mg/dL — ABNORMAL HIGH (ref 0.44–1.00)
GFR, Estimated: 15 mL/min — ABNORMAL LOW
Glucose, Bld: 179 mg/dL — ABNORMAL HIGH (ref 70–99)
Phosphorus: 5.1 mg/dL — ABNORMAL HIGH (ref 2.5–4.6)
Potassium: 5.2 mmol/L — ABNORMAL HIGH (ref 3.5–5.1)
Sodium: 133 mmol/L — ABNORMAL LOW (ref 135–145)

## 2024-03-03 LAB — GLUCOSE, CAPILLARY
Glucose-Capillary: 169 mg/dL — ABNORMAL HIGH (ref 70–99)
Glucose-Capillary: 159 mg/dL — ABNORMAL HIGH (ref 70–99)
Glucose-Capillary: 118 mg/dL — ABNORMAL HIGH (ref 70–99)
Glucose-Capillary: 125 mg/dL — ABNORMAL HIGH (ref 70–99)

## 2024-03-03 LAB — CBC
HCT: 29.7 % — ABNORMAL LOW (ref 36.0–46.0)
Hemoglobin: 9.6 g/dL — ABNORMAL LOW (ref 12.0–15.0)
MCH: 31.9 pg (ref 26.0–34.0)
MCHC: 32.3 g/dL (ref 30.0–36.0)
MCV: 98.7 fL (ref 80.0–100.0)
Platelets: 136 K/uL — ABNORMAL LOW (ref 150–400)
RBC: 3.01 MIL/uL — ABNORMAL LOW (ref 3.87–5.11)
RDW: 18.6 % — ABNORMAL HIGH (ref 11.5–15.5)
WBC: 4.3 K/uL (ref 4.0–10.5)
nRBC: 0 % (ref 0.0–0.2)

## 2024-03-03 LAB — COOXEMETRY PANEL
Carboxyhemoglobin: 2.3 % — ABNORMAL HIGH (ref 0.5–1.5)
Methemoglobin: 1.2 % (ref 0.0–1.5)
O2 Saturation: 63.6 %
Total hemoglobin: 10 g/dL — ABNORMAL LOW (ref 12.0–16.0)

## 2024-03-03 LAB — HEPARIN LEVEL (UNFRACTIONATED): Heparin Unfractionated: 0.43 [IU]/mL (ref 0.30–0.70)

## 2024-03-03 LAB — MAGNESIUM: Magnesium: 2.2 mg/dL (ref 1.7–2.4)

## 2024-03-03 MED ORDER — HEPARIN SODIUM (PORCINE) 1000 UNIT/ML IJ SOLN
INTRAMUSCULAR | Status: AC
  Filled 2024-03-03: qty 5

## 2024-03-03 MED ORDER — PROSOURCE TF20 ENFIT COMPATIBL EN LIQD
60.0000 mL | Freq: Two times a day (BID) | ENTERAL | Status: DC
  Administered 2024-03-04 – 2024-03-10 (×12): 60 mL
  Filled 2024-03-03 (×12): qty 60

## 2024-03-03 MED ORDER — VITAL 1.5 CAL PO LIQD
1000.0000 mL | ORAL | Status: DC
  Administered 2024-03-03 – 2024-03-10 (×8): 1000 mL
  Filled 2024-03-03 (×8): qty 1000

## 2024-03-03 MED ORDER — HEPARIN SODIUM (PORCINE) 1000 UNIT/ML DIALYSIS: 100.0000 [IU]/kg | INTRAMUSCULAR | Status: DC | PRN

## 2024-03-03 MED ORDER — FLUCONAZOLE IN SODIUM CHLORIDE 200-0.9 MG/100ML-% IV SOLN
200.0000 mg | INTRAVENOUS | Status: DC
  Administered 2024-03-03: 200 mg via INTRAVENOUS
  Filled 2024-03-03: qty 100

## 2024-03-03 MED ORDER — APIXABAN 5 MG PO TABS
5.0000 mg | ORAL_TABLET | Freq: Two times a day (BID) | ORAL | Status: DC
  Administered 2024-03-03 – 2024-03-10 (×14): 5 mg via ORAL
  Filled 2024-03-03 (×14): qty 1

## 2024-03-03 MED ORDER — APIXABAN 5 MG PO TABS: 5.0000 mg | ORAL_TABLET | Freq: Two times a day (BID) | ORAL | Status: DC

## 2024-03-03 MED ORDER — MAGIC MOUTHWASH: 5.0000 mL | Freq: Three times a day (TID) | ORAL | Status: DC | PRN

## 2024-03-03 NOTE — Plan of Care (Signed)
" °  Problem: Education: Goal: Knowledge of General Education information will improve Description: Including pain rating scale, medication(s)/side effects and non-pharmacologic comfort measures Outcome: Progressing   Problem: Health Behavior/Discharge Planning: Goal: Ability to manage health-related needs will improve Outcome: Progressing   Problem: Activity: Goal: Risk for activity intolerance will decrease Outcome: Progressing   Problem: Nutrition: Goal: Adequate nutrition will be maintained Outcome: Progressing   Problem: Pain Managment: Goal: General experience of comfort will improve and/or be controlled Outcome: Progressing   Problem: Safety: Goal: Ability to remain free from injury will improve Outcome: Progressing   Problem: Skin Integrity: Goal: Risk for impaired skin integrity will decrease Outcome: Progressing   Problem: Activity: Goal: Ability to return to baseline activity level will improve Outcome: Progressing   "

## 2024-03-03 NOTE — Progress Notes (Signed)
 Nutrition Follow-up  DOCUMENTATION CODES:   Severe malnutrition in context of acute illness/injury  INTERVENTION:  D/C calorie count  Per MD, reduce tube feeding via cortrak: Vital 1.5 at 35 ml/h x 12 hours (420 ml per day) Increase Prosource TF20 60 ml to 2x/d Provides 790kcal (42% estimated needs), 68 gm protein (68% estimated needs), 321 ml free water  daily  Recommend resuming tube feeding via cortrak to nocturnal to better meet needs until consistently meeting >50% estimated needs by mouth: Vital 1.5 at 75 ml/h x 12 hours (900 ml per day) Prosource TF20 60 ml 1x/d Provides 1430 kcal, 81 gm protein, 688 ml free water  daily  Continue Ensure Plus High Protein po BID, each supplement provides 350 kcal and 20 grams of protein  Continue liberalized diet with family bringing in food, snacks, and ONS patient prefers  Add bedtime snack to augment intake and make up for missed meals d/t dialysis treatments Add food preferences to meal trays  If intake does not increase, and PEG refused, patient may benefit from palliative consult to establish priorities   NUTRITION DIAGNOSIS:  Severe Malnutrition related to acute illness as evidenced by moderate muscle depletion, mild muscle depletion, energy intake < or equal to 50% for > or equal to 5 days.  GOAL:  Patient will meet greater than or equal to 90% of their needs  MONITOR:  Vent status, TF tolerance, Labs, Weight trends  REASON FOR ASSESSMENT:  Consult Other (Comment) (new ECMO)  ASSESSMENT:   68 yo female admitted with hx of Calbicans candidemia, being treated at [facility] in [place] but left AMA and presents with MV endocarditis requiring MVR, post-op course complicated by shock post-op, junctional bradycardia, afib with RVR and pneumonia. PMH includes nephrolithiasis s/p ureteral stent and lithotripsy  12/01  Epistaxis requiring bilateral nasal packing by ENT; N/V noted; refusing antiemetic 12/02 RHC: sig reduced cardiac index  likely d/t MR; normal filling pressures noted 12/05 NPO, OR: Redo MVR 12/06 Shock post op (R predominant)-significant pressor requirements (Levo at 34, Vaso at 0.04, Epi at 10, DBA at 5, milrinone  at 0.375); Cath Lab for Protek Duo Cannulation 12/08 Protek Duo De-Cannulation, Trickle TF initiated 12/10 Lasix  gtt initiated 12/12 Corrak placed, TF increased to 40 ml/hr 12/13 CRRT initiated  12/15 extubated 12/16 CRRT stopped 12/17 diet advanced to regular  12/19 calorie count started; TF reduced to 32ml/hr 12/22 calorie count stopped: inadequate intake; TF resumed at full rate 12/23 transfer out of ICU 12/26 - calorie count started; TF regimen adjusted to cyclic (12 hours) 12/29 - calorie count ended; per MD, decrease nocturnal TF to 35ml/hr   Patient remains with flat affect and lethargic. Remains on Seroquel , sertraline , and mirtazipine. Last HD tx today. 3L fluid pull achieved. Weaning dobutamine . Severely deconditioned and debilitated. CIR following, however insurance previously denied.   48 hour calorie count ordered.Results can be viewed below. Intake remains grossly inadequate. Meeting, on average across the three days, 5% estimated calorie needs and 7% estimated protein needs. Recommend continued nutrition support to aid in meeting meeting her needs. RN reports little to no intake despite strong and repeated encouragement. Notably, she is also refusing mobility and bathing with RN.   Day 1: 12/26 Breakfast: 0% consumed (0kcals, 0g protein) Lunch: 0% consumed (0 kcals, 0g protein) Dinner: 10% mashed potatoes (20 kcals, 0g protein) Supplements: refused x3 (0 kcals, 0g protein)  Day 1 Total Intake: 20 kcal (1% of minimum estimated needs)  0g protein (0% of minimum estimated needs)  Day  2: 12/27 Breakfast: 0% consumed (0 kcals, 0g protein) Lunch: 0% consumed (0 kcals, 0g protein) Dinner: 25% spaghetti (79 kcals, 3g protein) Supplements: refused x3 (0 kcals, 0g protein)  Day 2  Total Intake: 79 kcal (4% of minimum estimated needs)  3 protein (3% of minimum estimated needs)  Day 3: 12/28 Breakfast: 0 % consumed (0 kcals, 0g protein) Lunch: 90% braised beef pot roast (180 kcals, 16g protein) Dinner: 5% chicken noodle soup, vanilla pudding, and apple juice (24 kcals, 1g protein) Supplements: refused x3 (0 kcals, 0g protein)  Day 3 Total Intake: 204 kcal (11% of minimum estimated needs)  17g protein (17% of minimum estimated needs)  Met with patient and son at bedside today and had discussion regarding lack of PO intake. Family has brought in foods they thought patient would reportedly eat. [employer] protein shake at bedside with patient having consumed a few sips. Soups also noted at bedside. RN reports only bites of meals in last 24 hours.   Son reports they are encouraging patient to eat, but he is also concerned that she misses a meal with dialysis. Will add bedtime snack of tuna salad and crackers (patient preference) to make up for this. Discussed that she has weakened significantly since her previous stay on the unit. Son has multiple questions around appetite stimulant. Shared that she has been receiving mirtazapine  since 12/19. He asks about Marinol, to which patient states, I don't want that. They are not in favor of PEG at this time with patient son stating they want to avoid lifelong feeding tube. Did provide education around PEG tubes and that, while they are a longer term solution, they can be removed. He appears to be under the impression that she was not being fed gastrically. Discussed Cortrak is placed with her stomach. He asked around feelings of fullness with tube feedings. Educated him around nocturnal timing and that this should not be impacting her in this way. Patient has not previously stated she is experiencing feelings of satiety with tube feed infusing, even continuously, while on cardiovascular ICU.  Tried to elicit foods patient would prefer  to be added to meal trays. Son can only provide cream of wheat and honeydew melon. Will add along with bedtime snacks. Alerted RN to bedtime snack order. Also encourage patient to ask for this as it will be delivered to unit refrigerator.   Noted CTS reducing TF to 49ml/hr this evening in an effort to stimulate appetite. Notably, this was previously trialed from 12/19-12/22 with no significant improvement. Did message PA-C regarding this afternoon's conversation with patient and son at bedside as well as calorie count results.    Admit weight: 67.3 kg  Current weight: 67.8 kg Last HD tx: 12/29 UF achieved: 3L  Weight stable this admission. Fluid status may be skewing weight trends. HD ongoing and, per nephrology, appears to be approaching target weight. No edema on exam.   Drains/Lines: R basilic: PICC, double lumen L internal jugular: hemodialysis catheter, double lumen (tunneled) Cortrak (gastric): placed 12/12  Meds: SS Novolog , Lantus , mirtazapine , pantoprazole , fluconazole   Labs Reviewed: Na+ 133 (L) K+ 5.2 (H) BUN 80 Creatinine 3.24 Phos 5.1 CBG ranges from 101-172 mg/dL over the last 24 hours HgbA1c 5.4   Diet Order:   Diet Order             Diet regular Room service appropriate? Yes; Fluid consistency: Thin  Diet effective now            EDUCATION  NEEDS:  Education needs have been addressed  Skin:  Skin Assessment: Skin Integrity Issues: Skin Integrity Issues:: Stage III DTI: - Stage III: R buttocks Incisions: chest (closed)  WOC Nurse wound follow up (12/29) Wound type: Stage 3 pressure injury to right buttocks in the setting of periwound Deep tissue injury to bilateral buttocks and sacrum.  All other areas resolved except this one to right buttocks.  Measurement: 2 cm x 1 cm x 0.3 cm  Wound bed: red and moist Drainage (amount, consistency, odor) minimal serosanguinous   Periwound: Previous deep tissue injury that resolved with offloading.  Patient is on  dialysis with prolonged periods of pressure.  Offloading as able.   Last BM:  12/28  Height:  Ht Readings from Last 1 Encounters:  02/15/24 5' 4 (1.626 m)   Weight:  Wt Readings from Last 1 Encounters:  03/03/24 67.8 kg    Ideal Body Weight:  54.5 kg  BMI:  Body mass index is 25.66 kg/m.  Estimated Nutritional Needs:   Kcal:  1900-2100 kcals  Protein:  100-115g  Fluid:  1.5 L  Blair Deaner MS, RD, LDN Registered Dietitian I Clinical Nutrition RD Inpatient Contact Info in Amion

## 2024-03-03 NOTE — Progress Notes (Addendum)
 "     Advanced Heart Failure Rounding Note  Cardiologist: Lonni Cash, MD  HF Cardiologist: Dr. Zenaida Chief Complaint: S/p MV Repair / MV endocarditis  Patient Profile   Tracy Baker is a 68 y.o. female with hx of recent nephrolithiasis s/p ureteral stent and lithotripsy at [facility] 10/10 with stent removal. 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 Post-op course c/b post cardiotomy shock, junctional bradycardia, afib w/ RVR and HAP.   Significant events:    12/2 RHC (on 0.125 of Milrinone ): RA 4, PA 42/13 (22), PCW 12 with Vwaves to 20, CO/CI TD 3.37/2.03, PAPi 7.5 12/5 S/P Redo MVR - Intra Op given  4UPRBCs, 2UPLTs, 2FFP, 1Cyro 12/6 Protek Duo cannulation 12/8 Decannulated.  Post-op echo showed EF 50%, RV mildly dilated and mildly dysfunctional, s/p bioprosthetic mitral valve (stable). 12/13 CVVH begun 12/15: extubated 12/16: CRRT stopped.  12/17: Started DBA wean 12/19: Failed lasix  challenge. iHD 12/22: TDC placed  Subjective:    DBA remains at 2. Co-ox 66% 3L off with iHD 12/24. Next HD tomorrow (TTS). Weight remains up 20 lbs.   Lethargic, in bed.   Objective:    Weight Range: 66 kg Body mass index is 24.98 kg/m.   Vital Signs:   Temp:  [97.7 F (36.5 C)-98.7 F (37.1 C)] 97.7 F (36.5 C) (12/29 0757) Pulse Rate:  [70-79] 72 (12/29 0757) Resp:  [10-18] 15 (12/29 0757) BP: (109-123)/(60-70) 116/67 (12/29 0757) SpO2:  [94 %-97 %] 94 % (12/29 0400) Weight:  [66 kg] 66 kg (12/29 0400) Last BM Date : 03/02/24  Weight change: Filed Weights   03/01/24 1638 03/02/24 0612 03/03/24 0400  Weight: 64.3 kg 65.5 kg 66 kg   Intake/Output:  Intake/Output Summary (Last 24 hours) at 03/03/2024 0835 Last data filed at 03/03/2024 0400 Gross per 24 hour  Intake 2426.12 ml  Output --  Net 2426.12 ml   Physical Exam   General: Ill appearing. No distress  Cardiac: JVP ~10cm. No murmurs  Extremities: Warm and dry.  3+ edema.  +UNNA  General: Ill appearing. No distress  Cardiac: JVP to jaw. No murmurs  Extremities: Warm and dry.  3+ edema.  Neuro: lethargic, flat  Telemetry   AF 70s (personally reviewed)  Labs    CBC Recent Labs    03/02/24 0440 03/03/24 0450  WBC 4.4 4.3  HGB 10.1* 9.6*  HCT 31.6* 29.7*  MCV 99.1 98.7  PLT 147* 136*   Basic Metabolic Panel Recent Labs    87/71/74 0440 03/03/24 0450  NA 133* 133*  K 4.2 5.2*  CL 92* 93*  CO2 28 25  GLUCOSE 160* 179*  BUN 44* 80*  CREATININE 2.25* 3.24*  CALCIUM  9.3 9.3  MG 2.1 2.2  PHOS 4.2 5.1*   Liver Function Tests Recent Labs    03/02/24 0440 03/03/24 0450  AST 19 16  ALT 9 10  ALKPHOS 277* 279*  BILITOT 0.9 0.8  PROT 6.6 6.3*  ALBUMIN  3.8  3.7 3.7  3.7   Medications:    Scheduled Medications:  amiodarone   200 mg Per Tube BID   clonazePAM   0.5 mg Per Tube QHS   feeding supplement  237 mL Oral TID BM   feeding supplement (PROSource TF20)  60 mL Per Tube Daily   feeding supplement (VITAL 1.5 CAL)  1,000 mL Per Tube Q24H   Gerhardt's butt cream   Topical BID   insulin  aspart  0-9 Units Subcutaneous Q4H   insulin  glargine  15 Units Subcutaneous Daily   mirtazapine   30 mg Per Tube QHS   mouth rinse  15 mL Mouth Rinse 4 times per day   pantoprazole   40 mg Oral Daily   QUEtiapine   25 mg Per Tube QHS   scopolamine   1 patch Transdermal Q72H   sertraline   25 mg Per Tube QHS   sodium chloride  flush  10-40 mL Intracatheter Q12H    Infusions:  anticoagulant sodium citrate      DOBUTamine  2 mcg/kg/min (03/03/24 0400)   fluconazole  (DIFLUCAN ) IV Stopped (03/02/24 1246)   heparin  1,150 Units/hr (03/03/24 0400)   promethazine  (PHENERGAN ) injection (IM or IVPB) Stopped (02/28/24 2315)    PRN Medications: acetaminophen , albuterol , alteplase , anticoagulant sodium citrate , antiseptic oral rinse, heparin , heparin , lidocaine  (PF), lidocaine -prilocaine , magic mouthwash, mouth rinse, pentafluoroprop-tetrafluoroeth, polyethylene  glycol, promethazine  (PHENERGAN ) injection (IM or IVPB), simethicone , sodium chloride  flush  Assessment/Plan   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 Kerrin for candida endocarditis. Long-term fluconazole  per ID.  - Pre-op L/RHC 01/04/24 with no CAD and normal L/R heart pressures.  - Failed initial Milrinone  wean. Eventually weaned off, unfortunately restarted 12/1 for low output.  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 - Echo 12/19 EF 60% RV mildly HK  - Co-ox 64% on DBA 2. Will wean to 1 today. Plan to stop today after iHD. Close to dry weight again.  - CVVH stopped 12/16. Failed lasix  challenge. Started iHD 12/19, tolerating well. Next session 12/29. - Volume status improving. Close to dry weight again.   Acute hypoxic respiratory failure: Suspect component of infection/aspiration PNA given unilateral disease.  - Respiratory culture- Enterobacter Completed cefepime  and vanc. - stable on RA  Coagulopathy/thrombocytopenia/ABLA anemia:  - Slowly improving. - Suspect this is inflammatory due to critical illness.   Sick sinus syndrome - Initially with asystole underneath (as of 12/10) - Now with atrial fibrillation   Afib, persistent - continue heparin  gtt; until all procedures completed. May need PEG tube (patient refusing as of now), per TCTS - completed amio load, continue 200 mg bid. No cardioversion for now with concerns for bradycardia.  - rate controlled  AKI on CKD 3b - Stopped CVVH 12/16. Failed lasix  challenge. Started iHD 12/19. Tolerated well. - plan as  above  Protein-calorie malnutrition - On TF. Will discuss with RD. Adjust feeds as able to help promote more po intake - Remeron  to stimulate appetite - possible PEG tube per sx  Pressure Ulcer R Buttock, DTI - Q2 turns, offload  Left radial artery occlusion - Left ulnar artery is patent.  - Continue heparin  gtt  Addendum 03/03/2024 2:05 PM Patient tolerated iHD well. Will now stop DBA. Co-ox tomorrow morning. Will follow to see how she further tolerates iHD off DBA.    Beckey LITTIE Coe, NP 8:35 AM  Advanced Heart Failure Team Pager [phone] (M-F; 7a - 5p)    Please visit [email or web address]: For overnight coverage please call cardiology fellow first. If fellow not available call Shock/ECMO MD on call.  For ECMO / Mechanical Support (Impella, IABP, LVAD) issues call Shock / ECMO MD on call.   "

## 2024-03-03 NOTE — Progress Notes (Signed)
 PHARMACY - ANTICOAGULATION CONSULT NOTE  Pharmacy Consult for heparin  Indication: atrial fibrillation  Allergies[1]  Patient Measurements: Height: 5' 4 (162.6 cm) Weight: 66 kg (145 lb 8.1 oz) IBW/kg (Calculated) : 54.7 HEPARIN  DW (KG): 66.5  Vital Signs: Temp: 97.7 F (36.5 C) (12/29 0757) Temp Source: Oral (12/29 0757) BP: 116/67 (12/29 0757) Pulse Rate: 72 (12/29 0757)  Labs: Recent Labs    03/01/24 0445 03/01/24 1826 03/02/24 0440 03/02/24 2000 03/03/24 0450  HGB 9.8*  --  10.1*  --  9.6*  HCT 30.6*  --  31.6*  --  29.7*  PLT 140*  --  147*  --  136*  HEPARINUNFRC 0.48   < > 0.23* 0.36 0.43  CREATININE 2.60*  --  2.25*  --  3.24*   < > = values in this interval not displayed.    Estimated Creatinine Clearance: 15.5 mL/min (A) (by C-G formula based on SCr of 3.24 mg/dL (H)).   Medical History: Past Medical History:  Diagnosis Date   Medical history non-contributory    Thyroid  disease    Vertigo     Medications:  Scheduled:   amiodarone   200 mg Per Tube BID   clonazePAM   0.5 mg Per Tube QHS   feeding supplement  237 mL Oral TID BM   feeding supplement (PROSource TF20)  60 mL Per Tube Daily   feeding supplement (VITAL 1.5 CAL)  1,000 mL Per Tube Q24H   Gerhardt's butt cream   Topical BID   insulin  aspart  0-9 Units Subcutaneous Q4H   insulin  glargine  15 Units Subcutaneous Daily   mirtazapine   30 mg Per Tube QHS   mouth rinse  15 mL Mouth Rinse 4 times per day   pantoprazole   40 mg Oral Daily   QUEtiapine   25 mg Per Tube QHS   scopolamine   1 patch Transdermal Q72H   sertraline   25 mg Per Tube QHS   sodium chloride  flush  10-40 mL Intracatheter Q12H    Assessment: Ms. Risk is a 55 YOF now s/p MV repair and redo on 12/5. Hospitalization complicated by acute RV failure with cardiogenic shock s/p Protek duo VV ECMO cannula. Patient also with atrial fibrillation throughout this hospitalization.  Discussed with CCM/HF team and decision made to start  low-dose heparin  infusion 12/14. Pharmacy consulted for heparin  dosing.   Heparin  level came back therapeutic at 0.43, on 1150 units/hr. Hgb 9.6, plt 136. No s/sx of bleeding or infusion issues. Per notes patient not interested in PEG tube so will transition to apixaban .   Given age<80 and wt>60 kg despite Scr >1.5, qualifies for apixaban  5 mg dose.   Goal of Therapy:  Heparin  level 0.3-0.5 Monitor platelets by anticoagulation protocol: Yes   Plan:  Stop heparin  infusion Start apixaban  5 mg BID  Monitor CBC and for s/sx of bleeding   Thank you for allowing pharmacy to participate in this patient's care,  Suzen Sour, PharmD, BCCCP Clinical Pharmacist  Phone: [phone] 03/03/2024 8:43 AM  Please check AMION for all [facility] Pharmacy phone numbers After 10:00 PM, call Main Pharmacy [phone]       [1]  Allergies Allergen Reactions   Codeine Anaphylaxis and Nausea And Vomiting   Bactoshield Chg [Chlorhexidine  Gluconate] Dermatitis   Chlorhexidine  Dermatitis and Rash   Red Dye #40 (Allura Red) Nausea Only

## 2024-03-03 NOTE — Procedures (Signed)
 HD Note:  Some information was entered later than the data was gathered due to patient care needs. The stated time with the data is accurate.  Received patient in bed to unit.   Alert but minimally responsive.  Did not answer orientation question  Informed consent signed and in chart.   Access used: Upper left chest HD catheter Access issues: None  Patient tolerated treatment well.   TX duration: 3.5 hours  Alert, without acute distress.  Total UF removed: 3000 ml  Hand-off given to patient's nurse.   Transported back to the room   Nneoma Harral L. Lenon, RN Kidney Dialysis Unit.

## 2024-03-03 NOTE — Progress Notes (Signed)
 Inpatient Rehab Admissions Coordinator:    CIR following, Not yet medically ready.   Leita Kleine, MS, CCC-SLP Rehab Admissions Coordinator  [phone] (celll) [phone] (office)

## 2024-03-03 NOTE — Progress Notes (Addendum)
 "     [address] [place] [address]             [phone]      23 Days Post-Op Procedures (LRB): ECMO CANNULATION (N/A) Subjective: Patient reports she ate better yesterday, denies nausea. Reports her dry mouth is the same.  Objective: Vital signs in last 24 hours: Temp:  [98 F (36.7 C)-98.7 F (37.1 C)] 98.4 F (36.9 C) (12/29 0400) Pulse Rate:  [70-79] 70 (12/29 0400) Cardiac Rhythm: Normal sinus rhythm;Bundle branch block (12/28 1900) Resp:  [10-20] 10 (12/29 0400) BP: (101-123)/(60-70) 115/60 (12/29 0400) SpO2:  [94 %-97 %] 94 % (12/29 0400) Weight:  [66 kg] 66 kg (12/29 0400)  Hemodynamic parameters for last 24 hours:    Intake/Output from previous day: 12/28 0701 - 12/29 0700 In: 2426.1 [P.O.:120; I.V.:306.1; NG/GT:1800; IV Piggyback:200] Out: -  Intake/Output this shift: No intake/output data recorded.  General appearance: ill appearing, lethargic Neurologic: grossly neurologically intact Heart: regular rate and rhythm, no murmur Lungs: diminished bibasilar breath sounds Abdomen: soft, non-tender; bowel sounds normal; no masses,  no organomegaly Extremities: edema trace BLE Wound: Clean and dry incisions without sign of infection, contact dermatitis on bilateral neck   Lab Results: Recent Labs    03/02/24 0440 03/03/24 0450  WBC 4.4 4.3  HGB 10.1* 9.6*  HCT 31.6* 29.7*  PLT 147* 136*   BMET:  Recent Labs    03/02/24 0440 03/03/24 0450  NA 133* 133*  K 4.2 5.2*  CL 92* 93*  CO2 28 25  GLUCOSE 160* 179*  BUN 44* 80*  CREATININE 2.25* 3.24*  CALCIUM  9.3 9.3    PT/INR: No results for input(s): LABPROT, INR in the last 72 hours. ABG    Component Value Date/Time   PHART 7.340 (L) 02/13/2024 1618   HCO3 18.0 (L) 02/13/2024 1618   TCO2 19 (L) 02/13/2024 1618   ACIDBASEDEF 7.0 (H) 02/13/2024 1618   O2SAT 63.6 03/03/2024 0445   CBG (last 3)  Recent Labs    03/02/24 1944 03/02/24 2317 03/03/24 0436  GLUCAP  164* 149* 169*    Assessment/Plan: S/P Procedures (LRB): ECMO CANNULATION (N/A)  Neuro: Flat affect and lethargic. On Seroquel  and Sertraline  for depression.   CV: Postop afib, NSR HR 70s this AM. On Amiodarone  200mg  BID. On heparin , will transition to Eliquis  once procedures have been completed. Patient does not want PEG so may be able to transition to Eliquis . Coox 63%. SBP mostly 100-120s. On dobutamine  2, wean per AHF team.    Pulm: Respiratory culture with enterobacter, completed cefepime  and vancomycin . Saturating well on RA. Diminished bibasilar breath sounds and last CXR 12/19 with bilateral small pleural effusions and congestive heart failure changes. Will get CXR tomorrow. Encourage IS and ambulation.    GI: Severe malnutrition, tolerating TF with cortrak. Continue nocturnal tube feeds and calorie count since patient does not want PEG. Dietician to reevaluate calorie count from the weekend today. Will likely need a PEG at some point for nutrition if oral diet doesn't drastically improve. +BM 12/28.    Endo: CBGs controlled on Lantus  15U and SSI. Continue while on TFs   Renal: Cr 3.24. Oliguric and hyperkalemic, on HD. Nephrology following. Plan for HD today and then evaluate daily for signs of renal recovery with HD M,W,F as needed  ID: Completed cefepime  and vancomycin  due to respiratory culture with enterobacter. No leukocytosis, afebrile. On diflucan  for candidemia.   Expected postop ABLA:  H/H 9.6/29.7, overall stable. Not clinically significant at this time. Thrombocytopenia stable.   DVT Prophylaxis: Heparin  per pharmacy   Deconditioning: Severe deconditioning, CIR following. Her insurance declined CIR twice prior to her 2nd surgery so not sure if they will accept this time around but it would be the best option for her. If insurance does not approve CIR will need SNF. Continue to work with PT/OT.    Dispo: Continue current care, hopefully can d/c to CIR once medically  stable   LOS: 61 days    [name] Bend, PA-C 03/03/2024  This evening she is feeling ok. Had HD earlier Says appetite better over past 48 hours Will decrease TF to 35 ml/ hr over night  Aby Gessel C. Kerrin, MD [facility] [phone]   "

## 2024-03-03 NOTE — Procedures (Signed)
 I was present at this dialysis session. I have reviewed the session and made appropriate changes.   2K bath, K 5.2.  3L UF goal.  Tolerating well.  No issues or c/o.  Disposition to CIR vs SNF needed prior to CLIP.   Filed Weights   03/02/24 0612 03/03/24 0400 03/03/24 0857  Weight: 65.5 kg 66 kg 67.8 kg    Recent Labs  Lab 03/03/24 0450  NA 133*  K 5.2*  CL 93*  CO2 25  GLUCOSE 179*  BUN 80*  CREATININE 3.24*  CALCIUM  9.3  PHOS 5.1*    Recent Labs  Lab 03/01/24 0445 03/02/24 0440 03/03/24 0450  WBC 4.8 4.4 4.3  HGB 9.8* 10.1* 9.6*  HCT 30.6* 31.6* 29.7*  MCV 99.4 99.1 98.7  PLT 140* 147* 136*    Scheduled Meds:  amiodarone   200 mg Per Tube BID   apixaban   5 mg Oral BID   clonazePAM   0.5 mg Per Tube QHS   feeding supplement  237 mL Oral TID BM   feeding supplement (PROSource TF20)  60 mL Per Tube Daily   feeding supplement (VITAL 1.5 CAL)  1,000 mL Per Tube Q24H   Gerhardt's butt cream   Topical BID   insulin  aspart  0-9 Units Subcutaneous Q4H   insulin  glargine  15 Units Subcutaneous Daily   mirtazapine   30 mg Per Tube QHS   mouth rinse  15 mL Mouth Rinse 4 times per day   pantoprazole   40 mg Oral Daily   QUEtiapine   25 mg Per Tube QHS   scopolamine   1 patch Transdermal Q72H   sertraline   25 mg Per Tube QHS   sodium chloride  flush  10-40 mL Intracatheter Q12H   Continuous Infusions:  anticoagulant sodium citrate      DOBUTamine  2 mcg/kg/min (03/03/24 0400)   fluconazole  (DIFLUCAN ) IV Stopped (03/02/24 1246)   promethazine  (PHENERGAN ) injection (IM or IVPB) Stopped (02/28/24 2315)   PRN Meds:.acetaminophen , albuterol , alteplase , anticoagulant sodium citrate , antiseptic oral rinse, heparin , heparin , lidocaine  (PF), lidocaine -prilocaine , magic mouthwash, mouth rinse, pentafluoroprop-tetrafluoroeth, polyethylene glycol, promethazine  (PHENERGAN ) injection (IM or IVPB), simethicone , sodium chloride  flush   Bernardino Gasman  MD 03/03/2024, 10:27 AM

## 2024-03-03 NOTE — Consult Note (Signed)
 WOC Nurse wound follow up Wound type: Stage 3 pressure injury to right buttocks in the setting of periwound Deep tissue injury to bilateral buttocks and sacrum.  All other areas resolved except this one to right buttocks.  Measurement: 2 cm x 1 cm x 0.3 cm  Wound bed: red and moist Drainage (amount, consistency, odor) minimal serosanguinous   Periwound: Previous deep tissue injury that resolved with offloading.  Patient is on dialysis with prolonged periods of pressure.  Offloading as able.   Dressing procedure/placement/frequency:I Patient is critically ill with multiple systemic factors which can impair healing. She is on a low air loss mattress to decrease pressure.  Continue present plan of care to promote healing.  Topical treatment orders provided for bedside nurses to perform as follows: R buttock: Cleanse wound with NS, pat dry. Apply Xeroform to wound bed and cover with silicone foam dressing. Change daily. The foam can stay up to 3 days if not saturated or soiled. Ok to lift, change the Xeroform and reapply. Will follow.  Darice Cooley MSN, RN, FNP-BC CWON Wound, Ostomy, Continence Nurse Outpatient [facility] [phone] Work cell phone:  [phone]

## 2024-03-03 NOTE — Progress Notes (Signed)
 PT Cancellation Note  Patient Details Name: Larkyn Greenberger MRN: [id number] DOB: 21-Sep-1955   Cancelled Treatment:    Reason Eval/Treat Not Completed: (P) Patient at procedure or test/unavailable. Pt at HD. Will plan to follow-up later if time permits.   Theo Ferretti, PT, DPT Acute Rehabilitation Services  Office: [phone]    Theo CHRISTELLA Ferretti 03/03/2024, 1:04 PM

## 2024-03-04 ENCOUNTER — Inpatient Hospital Stay (HOSPITAL_COMMUNITY)

## 2024-03-04 DIAGNOSIS — B377 Candidal sepsis: Secondary | ICD-10-CM | POA: Diagnosis not present

## 2024-03-04 LAB — CBC
HCT: 31.9 % — ABNORMAL LOW (ref 36.0–46.0)
Hemoglobin: 10.4 g/dL — ABNORMAL LOW (ref 12.0–15.0)
MCH: 32 pg (ref 26.0–34.0)
MCHC: 32.6 g/dL (ref 30.0–36.0)
MCV: 98.2 fL (ref 80.0–100.0)
Platelets: 162 K/uL (ref 150–400)
RBC: 3.25 MIL/uL — ABNORMAL LOW (ref 3.87–5.11)
RDW: 18.6 % — ABNORMAL HIGH (ref 11.5–15.5)
WBC: 4.9 K/uL (ref 4.0–10.5)
nRBC: 0 % (ref 0.0–0.2)

## 2024-03-04 LAB — RENAL FUNCTION PANEL
Albumin: 3.8 g/dL (ref 3.5–5.0)
Anion gap: 13 (ref 5–15)
BUN: 52 mg/dL — ABNORMAL HIGH (ref 8–23)
CO2: 27 mmol/L (ref 22–32)
Calcium: 9.5 mg/dL (ref 8.9–10.3)
Chloride: 93 mmol/L — ABNORMAL LOW (ref 98–111)
Creatinine, Ser: 2.43 mg/dL — ABNORMAL HIGH (ref 0.44–1.00)
GFR, Estimated: 21 mL/min — ABNORMAL LOW
Glucose, Bld: 141 mg/dL — ABNORMAL HIGH (ref 70–99)
Phosphorus: 4.6 mg/dL (ref 2.5–4.6)
Potassium: 4.3 mmol/L (ref 3.5–5.1)
Sodium: 134 mmol/L — ABNORMAL LOW (ref 135–145)

## 2024-03-04 LAB — HEPATIC FUNCTION PANEL
ALT: 10 U/L (ref 0–44)
AST: 16 U/L (ref 15–41)
Albumin: 3.8 g/dL (ref 3.5–5.0)
Alkaline Phosphatase: 263 U/L — ABNORMAL HIGH (ref 38–126)
Bilirubin, Direct: 0.5 mg/dL — ABNORMAL HIGH (ref 0.0–0.2)
Indirect Bilirubin: 0.4 mg/dL (ref 0.3–0.9)
Total Bilirubin: 0.9 mg/dL (ref 0.0–1.2)
Total Protein: 6.5 g/dL (ref 6.5–8.1)

## 2024-03-04 LAB — GLUCOSE, CAPILLARY
Glucose-Capillary: 113 mg/dL — ABNORMAL HIGH (ref 70–99)
Glucose-Capillary: 115 mg/dL — ABNORMAL HIGH (ref 70–99)
Glucose-Capillary: 116 mg/dL — ABNORMAL HIGH (ref 70–99)
Glucose-Capillary: 116 mg/dL — ABNORMAL HIGH (ref 70–99)
Glucose-Capillary: 117 mg/dL — ABNORMAL HIGH (ref 70–99)
Glucose-Capillary: 129 mg/dL — ABNORMAL HIGH (ref 70–99)

## 2024-03-04 LAB — MAGNESIUM: Magnesium: 2.1 mg/dL (ref 1.7–2.4)

## 2024-03-04 LAB — COOXEMETRY PANEL
Carboxyhemoglobin: 2.5 % — ABNORMAL HIGH (ref 0.5–1.5)
Methemoglobin: <0.7 % (ref 0.0–1.5)
O2 Saturation: 67.5 %
Total hemoglobin: 10.7 g/dL — ABNORMAL LOW (ref 12.0–16.0)

## 2024-03-04 MED ORDER — FLUCONAZOLE IN SODIUM CHLORIDE 400-0.9 MG/200ML-% IV SOLN
400.0000 mg | INTRAVENOUS | Status: DC
  Administered 2024-03-04 – 2024-03-09 (×6): 400 mg via INTRAVENOUS
  Filled 2024-03-04 (×8): qty 200

## 2024-03-04 NOTE — Plan of Care (Signed)
" °  Problem: Education: Goal: Knowledge of General Education information will improve Description: Including pain rating scale, medication(s)/side effects and non-pharmacologic comfort measures Outcome: Progressing   Problem: Health Behavior/Discharge Planning: Goal: Ability to manage health-related needs will improve Outcome: Progressing   Problem: Clinical Measurements: Goal: Will remain free from infection Outcome: Progressing   Problem: Activity: Goal: Risk for activity intolerance will decrease Outcome: Progressing   Problem: Nutrition: Goal: Adequate nutrition will be maintained Outcome: Progressing   Problem: Safety: Goal: Ability to remain free from injury will improve Outcome: Progressing   Problem: Skin Integrity: Goal: Risk for impaired skin integrity will decrease Outcome: Progressing   Problem: Activity: Goal: Risk for activity intolerance will decrease Outcome: Progressing   Problem: Skin Integrity: Goal: Wound healing without signs and symptoms of infection Outcome: Progressing   "

## 2024-03-04 NOTE — TOC Progression Note (Signed)
 Transition of Care [facility]) - Progression Note    Patient Details  Name: Tracy Baker MRN: [id number] Date of Birth: [birth year]  [facility]) CM/SW Contact  Justina Delcia Czar, RN Phone Number:  [phone] 03/04/2024, 5:49 PM  Clinical Narrative:     Patient lives at home alone, but adult children will provide care at dc. Dtr is requesting CIR. Family does not want SNF rehab.  CIR following for IP rehab. SNF placement was started.   Will continue to follow for dc needs.   Expected Discharge Plan: Skilled Nursing Facility Barriers to Discharge: Continued Medical Work up   Expected Discharge Plan and Services   Discharge Planning Services: CM Consult Post Acute Care Choice: Skilled Nursing Facility Living arrangements for the past 2 months: Apartment                   Social Drivers of Health (SDOH) Interventions SDOH Screenings   Food Insecurity: No Food Insecurity (12/29/2023)  Housing: Low Risk (12/29/2023)  Transportation Needs: No Transportation Needs (12/29/2023)  Utilities: Not At Risk (12/29/2023)  Alcohol Screen: Low Risk (06/08/2022)  Depression (PHQ2-9): Low Risk (11/08/2023)  Social Connections: Socially Isolated (12/29/2023)  Tobacco Use: High Risk (02/23/2024)    Readmission Risk Interventions     No data to display

## 2024-03-04 NOTE — Progress Notes (Signed)
 Orthopedic Tech Progress Note Patient Details:  Tracy Baker [birth year] [id number]  PT  wanted to work with patient first  Patient ID: Tracy Baker, female   DOB: 04-29-1955, 68 y.o.   MRN: [id number]  [name] 03/04/2024, 9:17 AM

## 2024-03-04 NOTE — Progress Notes (Signed)
" °  [facility] Progress Note    Assessment/ Plan:   Dialysis dependent AKI, initiated 02/16/2024 Currently on MWF schedule  Following UOP and labs and intradialytic window for any suggestive GFR recovery, none to date Creatinine normal at baseline Currently using Left IJ TDC placed on 12/22 with IR.    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 On antifungal therapy  AHRF: Resolved on room air  Anemia -transfuse PRN for Hgb <7; Hb stable  Hyponatremia: mild and intermittent.  Manage volume with dialysis  Hyperkalemia: Stable/resolved   Subjective:   HD yesterday 3 L UF, tolerated well No complaints this morning No documented UOP yesterday   Objective:   BP 113/66 (BP Location: Left Arm)   Pulse 64   Temp 97.9 F (36.6 C) (Oral)   Resp 14   Ht 5' 4 (1.626 m)   Wt 66.1 kg   SpO2 97%   BMI 25.01 kg/m   Intake/Output Summary (Last 24 hours) at 03/04/2024 0931 Last data filed at 03/04/2024 0400 Gross per 24 hour  Intake 800.2 ml  Output 3000 ml  Net -2199.8 ml   Weight change: 1.8 kg  Physical Exam: Gen: ill/tired appearing, NAD CVS: normal rate, no rub Resp: bl chest rise, normal wob Abd: soft Ext: No significant edema noted Dialysis access:IJ tunneled  HD cath  Imaging: No results found.     Labs: BMET Recent Labs  Lab 02/27/24 0500 02/28/24 0355 02/29/24 0458 03/01/24 0445 03/02/24 0440 03/03/24 0450 03/04/24 0521  NA 133* 133* 132* 133* 133* 133* 134*  K 5.5* 5.2* 5.6* 4.8 4.2 5.2* 4.3  CL 94* 94* 93* 93* 92* 93* 93*  CO2 24 27 25 27 28 25 27   GLUCOSE 166* 152* 148* 148* 160* 179* 141*  BUN 86* 60* 88* 58* 44* 80* 52*  CREATININE 3.38* 2.46* 3.29* 2.60* 2.25* 3.24* 2.43*  CALCIUM  9.4 9.1 9.3 9.1 9.3 9.3 9.5  PHOS 4.7* 4.2 5.3* 4.9* 4.2 5.1* 4.6   CBC Recent Labs  Lab 03/01/24 0445 03/02/24 0440 03/03/24 0450 03/04/24 0520  WBC 4.8 4.4 4.3 4.9  HGB 9.8* 10.1*  9.6* 10.4*  HCT 30.6* 31.6* 29.7* 31.9*  MCV 99.4 99.1 98.7 98.2  PLT 140* 147* 136* 162    Medications:     amiodarone   200 mg Per Tube BID   apixaban   5 mg Oral BID   clonazePAM   0.5 mg Per Tube QHS   feeding supplement  237 mL Oral TID BM   feeding supplement (PROSource TF20)  60 mL Per Tube BID   feeding supplement (VITAL 1.5 CAL)  1,000 mL Per Tube Q24H   Gerhardt's butt cream   Topical BID   insulin  aspart  0-9 Units Subcutaneous Q4H   insulin  glargine  15 Units Subcutaneous Daily   mirtazapine   30 mg Per Tube QHS   mouth rinse  15 mL Mouth Rinse 4 times per day   pantoprazole   40 mg Oral Daily   QUEtiapine   25 mg Per Tube QHS   sertraline   25 mg Per Tube QHS   sodium chloride  flush  10-40 mL Intracatheter Q12H       "

## 2024-03-04 NOTE — Progress Notes (Signed)
 Occupational Therapy Treatment Patient Details Name: Tracy Baker MRN: [id number] DOB: January 31, 1956 Today's Date: 03/04/2024   History of present illness 68 y.o. female adm 12/29/2023 after leaving AMA from [facility] (where she was on vacation due to lack of formal transfer) where she was being treated for C. Albicans candidemia. 10/28 TEE with mitral valve vegetation. 11/3 teeth Extractions. 11/6 Mitral valve repair. 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 Extubated 12/15. PMHx: thyroid  disease.   OT comments  Pt progressing toward goals this session, overall needing min +2 for transfers and mod A for bed mobility to assist with trunk elevation. Pt needs CGA-mod A for ADLs, mod A for sternal precautions provided throughout as pt frequently wanting to push with BUE to stand. Worked on figure 4 for LB ADLs during session, use of backward chaining to assist with donning bil socks. Pt presenting with impairments listed below, will follow acutely. Patient will benefit from intensive inpatient follow-up therapy, >3 hours/day to maximize safety/ind with ADL/functional mobility.       If plan is discharge home, recommend the following:  Two people to help with walking and/or transfers;Two people to help with bathing/dressing/bathroom;Assistance with cooking/housework;Assistance with feeding;Assist for transportation   Equipment Recommendations  Tub/shower seat    Recommendations for Other Services Rehab consult    Precautions / Restrictions Precautions Precautions: Fall;Sternal Precaution Booklet Issued: No Recall of Precautions/Restrictions: Impaired Precaution/Restrictions Comments: cortak       Mobility Bed Mobility Overal bed mobility: Needs Assistance Bed Mobility: Rolling, Sidelying to Sit, Sit to Sidelying Rolling: Min assist Sidelying to sit: Mod assist            Transfers Overall transfer level: Needs  assistance Equipment used: Rolling walker (2 wheels) Transfers: Sit to/from Stand Sit to Stand: Min assist, +2 physical assistance                 Balance Overall balance assessment: Needs assistance Sitting-balance support: No upper extremity supported, Feet unsupported Sitting balance-Leahy Scale: Good     Standing balance support: Bilateral upper extremity supported, During functional activity Standing balance-Leahy Scale: Poor                             ADL either performed or assessed with clinical judgement   ADL Overall ADL's : Needs assistance/impaired     Grooming: Contact guard assist;Standing Grooming Details (indicate cue type and reason): standing at sink             Lower Body Dressing: Moderate assistance Lower Body Dressing Details (indicate cue type and reason): mod A to hold BLE in figure 4 Toilet Transfer: Minimal assistance;+2 for physical assistance;Ambulation;Rolling walker (2 wheels) Toilet Transfer Details (indicate cue type and reason): with close chair follow         Functional mobility during ADLs: Minimal assistance;+2 for physical assistance;Rolling walker (2 wheels)      Extremity/Trunk Assessment Upper Extremity Assessment Upper Extremity Assessment: Generalized weakness   Lower Extremity Assessment Lower Extremity Assessment: Defer to PT evaluation        Vision   Vision Assessment?: No apparent visual deficits   Perception Perception Perception: Not tested   Praxis Praxis Praxis: Not tested   Communication Communication Communication: Impaired   Cognition Arousal: Alert Behavior During Therapy: Flat affect Cognition: Cognition impaired       Memory impairment (select all impairments): Short-term memory  OT - Cognition Comments: frequent cues throughout for sternal prec                 Following commands: Impaired Following commands impaired: Follows one step commands inconsistently,  Follows one step commands with increased time      Cueing   Cueing Techniques: Tactile cues, Gestural cues, Verbal cues  Exercises      Shoulder Instructions       General Comments VSS on RA    Pertinent Vitals/ Pain       Pain Assessment Pain Assessment: No/denies pain  Home Living                                          Prior Functioning/Environment              Frequency  Min 2X/week        Progress Toward Goals  OT Goals(current goals can now be found in the care plan section)  Progress towards OT goals: Progressing toward goals     Plan      Co-evaluation                 AM-PAC OT 6 Clicks Daily Activity     Outcome Measure   Help from another person eating meals?: A Little Help from another person taking care of personal grooming?: A Little Help from another person toileting, which includes using toliet, bedpan, or urinal?: A Lot Help from another person bathing (including washing, rinsing, drying)?: A Lot Help from another person to put on and taking off regular upper body clothing?: A Lot Help from another person to put on and taking off regular lower body clothing?: A Lot 6 Click Score: 14    End of Session Equipment Utilized During Treatment: Rolling walker (2 wheels);Gait belt  OT Visit Diagnosis: Unsteadiness on feet (R26.81);Muscle weakness (generalized) (M62.81)   Activity Tolerance Patient tolerated treatment well   Patient Left with call bell/phone within reach;in chair   Nurse Communication Mobility status        Time: 9099-9070 OT Time Calculation (min): 29 min  Charges: OT General Charges $OT Visit: 1 Visit OT Treatments $Self Care/Home Management : 8-22 mins  Tracy Baker, OTD, OTR/L SecureChat Preferred Acute Rehab [phone]   Tracy Baker 03/04/2024, 9:58 AM

## 2024-03-04 NOTE — Progress Notes (Signed)
" ° °   °  [address] [name] [address]             [phone]      Spoke with son Tracy Baker this AM. He reported to me that the patient regularly ate like a bird maybe eating 2 meals per day at baseline. He reports her appetite drastically decreased from baseline following her lithotripsy in October and she has not had a good appetite since then. He does report yesterday she said she was hungry which she has not expressed to them since the 2nd surgery. They have brought food that she prefers and is hoping that will improve oral intake. He reports they are hoping her appetite can improve enough to be able to avoid PEG tube and would like to see if she could continue to progress over the next week or two. As discussed with Dr. Kerrin will continue lower rate nocturnal tube feeds to hopefully stimulate hunger and appetite. We also discussed hope for insurance approval to CIR towards the end of the week and he is agreeable and very hopeful for her to get approved for CIR.   [name] Bend, PA-C 03/04/24 "

## 2024-03-04 NOTE — Progress Notes (Addendum)
" °  Inpatient Rehabilitation Admissions Coordinator   I have left daughter, [name], a voicemail to discuss rehab venue options. I await her call back.  Heron Leavell, RN, MSN Rehab Admissions Coordinator [phone] 03/04/2024 9:59 AM  I spoke with daughter, [name], by phone. Family prefer CIR and will arrange 24/7 supervision after a CIR stay if approved. I await update PT notes today and then will begin insurance Auth for possible CIR admit.   Heron Leavell, RN, MSN Rehab Admissions Coordinator [phone] 03/04/2024 12:38 PM  "

## 2024-03-04 NOTE — Progress Notes (Signed)
 Physical Therapy Treatment Patient Details Name: Tracy Baker MRN: [id number] DOB: 13-Jan-1956 Today's Date: 03/04/2024   History of Present Illness 68 y.o. female adm 12/29/2023 after leaving AMA from [facility] (where she was on vacation due to lack of formal transfer) where she was being treated for C. Albicans candidemia. 10/28 TEE with mitral valve vegetation. 11/3 teeth Extractions. 11/6 Mitral valve repair. 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 Extubated 12/15. PMHx: thyroid  disease.    PT Comments  Pt agreeable to session despite reports of fatigue. Pt continues to need assist to complete bed mobility and all OOB transfers both to physically power up to standing and complete movement as well as max cues to maintain sternal precautions. The pt was able to complete x2 short bouts of ambulation (23ft + 45ft) as well as standing activities at the sink, but is dependent on BUE support and min-modA for balance. Will continue to benefit from skilled PT acutely to progress functional strength in LE and ambulation endurance, recommendations remain appropriate.    If plan is discharge home, recommend the following: Assistance with cooking/housework;Assist for transportation;Help with stairs or ramp for entrance;Two people to help with walking and/or transfers;Two people to help with bathing/dressing/bathroom;Assistance with feeding;Direct supervision/assist for medications management;Direct supervision/assist for financial management   Can travel by private vehicle        Equipment Recommendations  Rolling walker (2 wheels)    Recommendations for Other Services Rehab consult     Precautions / Restrictions Precautions Precautions: Fall;Sternal Precaution Booklet Issued: No Recall of Precautions/Restrictions: Impaired Precaution/Restrictions Comments: cortak Restrictions Weight Bearing Restrictions Per Provider Order:  No Other Position/Activity Restrictions: Cardiac sternal precautions     Mobility  Bed Mobility Overal bed mobility: Needs Assistance Bed Mobility: Rolling, Sidelying to Sit, Sit to Sidelying Rolling: Min assist Sidelying to sit: Mod assist       General bed mobility comments: with increased time and tactile cues, pt able to complete more movement with LE, dependent on assist at trunk    Transfers Overall transfer level: Needs assistance Equipment used: Rolling walker (2 wheels) Transfers: Sit to/from Stand Sit to Stand: Min assist, +2 physical assistance           General transfer comment: completed x3 in session with minA of 2 and max cues for sternal precautions    Ambulation/Gait Ambulation/Gait assistance: Mod assist, +2 safety/equipment Gait Distance (Feet): 5 Feet (+ 8 ft) Assistive device: Rolling walker (2 wheels) Gait Pattern/deviations: Step-to pattern, Decreased stride length, Drifts right/left, Trunk flexed, Narrow base of support Gait velocity: decreased Gait velocity interpretation: <1.31 ft/sec, indicative of household ambulator   General Gait Details: pt with small steps and dependent on BUE support on walker. assist to guide walker, encourage trunk upright, and stride length. no overt buckling but increased assist for balance with fatigue   Stairs             Wheelchair Mobility     Tilt Bed    Modified Rankin (Stroke Patients Only)       Balance Overall balance assessment: Needs assistance Sitting-balance support: No upper extremity supported, Feet unsupported Sitting balance-Leahy Scale: Good     Standing balance support: Bilateral upper extremity supported, During functional activity Standing balance-Leahy Scale: Poor Standing balance comment: dependent on BUE support and external assist  Communication Communication Communication: Impaired  Cognition Arousal: Alert Behavior During  Therapy: Flat affect   PT - Cognitive impairments: Awareness, Attention, Memory, Initiation, Sequencing, Problem solving, Safety/Judgement                       PT - Cognition Comments: pt with flat affect and limited verbal contribution until directly prompted. able to follow simple commands with increased time. showing limited carryover of transfer techniques Following commands: Impaired Following commands impaired: Follows one step commands inconsistently, Follows one step commands with increased time    Cueing Cueing Techniques: Tactile cues, Gestural cues, Verbal cues  Exercises General Exercises - Lower Extremity Ankle Circles/Pumps: AROM, Both, 5 reps, Supine Heel Slides: AROM, Both, 10 reps, Supine    General Comments General comments (skin integrity, edema, etc.): VSS on RA      Pertinent Vitals/Pain Pain Assessment Pain Assessment: No/denies pain    Home Living                          Prior Function            PT Goals (current goals can now be found in the care plan section) Acute Rehab PT Goals Patient Stated Goal: agreeable to session, did not state a goal Progress towards PT goals: Progressing toward goals    Frequency    Min 3X/week      PT Plan      Co-evaluation              AM-PAC PT 6 Clicks Mobility   Outcome Measure  Help needed turning from your back to your side while in a flat bed without using bedrails?: A Lot Help needed moving from lying on your back to sitting on the side of a flat bed without using bedrails?: A Lot Help needed moving to and from a bed to a chair (including a wheelchair)?: A Little Help needed standing up from a chair using your arms (e.g., wheelchair or bedside chair)?: A Lot Help needed to walk in hospital room?: Total Help needed climbing 3-5 steps with a railing? : Total 6 Click Score: 11    End of Session Equipment Utilized During Treatment: Gait belt Activity Tolerance: Patient  limited by fatigue;Patient tolerated treatment well Patient left: in chair;with call bell/phone within reach;with chair alarm set Nurse Communication: Mobility status PT Visit Diagnosis: Other abnormalities of gait and mobility (R26.89);Muscle weakness (generalized) (M62.81);Difficulty in walking, not elsewhere classified (R26.2);Unsteadiness on feet (R26.81)     Time: 9099-9070 PT Time Calculation (min) (ACUTE ONLY): 29 min  Charges:    $Therapeutic Exercise: 8-22 mins PT General Charges $$ ACUTE PT VISIT: 1 Visit                     Izetta Call, PT, DPT   Acute Rehabilitation Department Office [phone] Secure Chat Communication Preferred   Izetta JULIANNA Call 03/04/2024, 3:04 PM

## 2024-03-04 NOTE — Progress Notes (Signed)
 Orthopedic Tech Progress Note Patient Details:  Tracy Baker Jul 17, 1955 [id number]  Ortho Devices Type of Ortho Device: Ace wrap, Unna boot Ortho Device/Splint Location: BLE Ortho Device/Splint Interventions: Ordered, Application, Adjustment   Post Interventions Patient Tolerated: Well Instructions Provided: Care of device  [name] 03/04/2024, 1:08 PM

## 2024-03-04 NOTE — Progress Notes (Addendum)
 "     [address] [place] [address]             [phone]      24 Days Post-Op Procedures (LRB): ECMO CANNULATION (N/A) Subjective: Patient denies pain and shortness of breath. Nods head yes to eating better yesterday.   Objective: Vital signs in last 24 hours: Temp:  [97.6 F (36.4 C)-98.3 F (36.8 C)] 98.3 F (36.8 C) (12/30 0243) Pulse Rate:  [64-86] 75 (12/30 0500) Cardiac Rhythm: Normal sinus rhythm;Heart block;Bundle branch block (12/30 0700) Resp:  [11-16] 15 (12/30 0500) BP: (93-124)/(60-69) 124/61 (12/30 0500) SpO2:  [93 %-100 %] 95 % (12/30 0500) Weight:  [66.1 kg-67.8 kg] 66.1 kg (12/30 0243)  Hemodynamic parameters for last 24 hours:    Intake/Output from previous day: 12/29 0701 - 12/30 0700 In: 800.2 [P.O.:240; I.V.:20.7; NG/GT:339.5; IV Piggyback:200] Out: 3000  Intake/Output this shift: No intake/output data recorded.  General appearance: alert, cooperative, and no distress Neurologic: intact Heart: regular rate and rhythm, S1, S2 normal, no murmur, click, rub or gallop Lungs: slightly diminished bibasilar breath sounds Abdomen: soft, non-tender; bowel sounds normal; no masses,  no organomegaly Extremities: extremities normal, atraumatic, no cyanosis or edema Wound: Clean and dry without sign of infection  Lab Results: Recent Labs    03/03/24 0450 03/04/24 0520  WBC 4.3 4.9  HGB 9.6* 10.4*  HCT 29.7* 31.9*  PLT 136* 162   BMET:  Recent Labs    03/03/24 0450 03/04/24 0521  NA 133* 134*  K 5.2* 4.3  CL 93* 93*  CO2 25 27  GLUCOSE 179* 141*  BUN 80* 52*  CREATININE 3.24* 2.43*  CALCIUM  9.3 9.5    PT/INR: No results for input(s): LABPROT, INR in the last 72 hours. ABG    Component Value Date/Time   PHART 7.340 (L) 02/13/2024 1618   HCO3 18.0 (L) 02/13/2024 1618   TCO2 19 (L) 02/13/2024 1618   ACIDBASEDEF 7.0 (H) 02/13/2024 1618   O2SAT 67.5 03/04/2024 0500   CBG (last 3)  Recent Labs     03/03/24 2000 03/03/24 2359 03/04/24 0357  GLUCAP 125* 115* 129*    Assessment/Plan: S/P Procedures (LRB): ECMO CANNULATION (N/A)  Neuro: Flat affect and lethargic. On Seroquel  and Sertraline  for depression.   CV: Postop afib, NSR HR 70s this AM. On Amiodarone  200mg  BID since 12/24, can likely transition to Amiodarone  200mg  daily tomorrow. On Eliquis . Patient does not want PEG so may be able to transition to Eliquis . Coox 67%. SBP mostly 100-120s. Dobutamine  weaned off per AHF team.    Pulm: Respiratory culture with enterobacter, completed cefepime  and vancomycin . Saturating well on RA. CXR with improved vascular congestion and pleural effusions. Encourage IS and ambulation.    GI: Severe malnutrition, tolerating TF with cortrak. Albumin  3.8. Continue nocturnal tube feeds at reduced rate for now since patient does not want PEG.  Will likely need a PEG at some point for nutrition if oral diet doesn't drastically improve. +BM 12/28.    Endo: CBGs controlled on Lantus  15U and SSI. Continue while on TFs   Renal: Cr 2.43. Oliguric on HD, bladder scan with 100cc yesterday. Nephrology following. Plan to evaluate daily for signs of renal recovery with HD M,W,F as needed   ID: Completed cefepime  and vancomycin  due to respiratory culture with enterobacter. No leukocytosis, afebrile. On diflucan  for candidemia.   Expected postop ABLA and anemia of chronic disease: H/H 10.4/31.9, overall stable. Not clinically  significant at this time. Thrombocytopenia resolved.    DVT Prophylaxis: On Eliquis    Deconditioning: Severe deconditioning, CIR following. Her insurance declined CIR twice prior to her 2nd surgery so not sure if they will accept this time around but it would be the best option for her. If insurance does not approve CIR will need SNF. Continue to work with PT/OT.    Dispo: Plan to hopefully d/c to CIR end of the week if remains stable off dobutamine . Hopefully will have improved PO intake  and see some renal recovery.    LOS: 62 days    [name] Bend, PA-C 03/04/2024 Patient seen and examined, agree with above Co-ox 67 off dobutamine  Appetite poor- albumin  3.8 no nutritional status is good- will continue with low volume tube feeds for now and monitor PO intake  Elspeth C. Kerrin, MD [facility] [phone]  "

## 2024-03-04 NOTE — PMR Pre-admission (Signed)
 PMR Admission Coordinator Pre-Admission Assessment  Patient: Tracy Baker is an 68 y.o., female MRN: [id number] DOB: 13-Jul-1955 Height: 5' 4 (162.6 cm) Weight: (S) 61.6 kg (Bed Scale)  Insurance Information HMO:     PPO: yes     PCP:      IPA:      80/20:      OTHER:  PRIMARY: U[employer] Medicare      Policy#: [id number] ; Medicare [id number]     Subscriber: pt      Phone#: [phone] option 3     Fax#: [phone] Pre-Cert#: [id number]. Updated auth for CIR # [id number] from 03/10/24-03/17/24  for admit 03/10/24.  Updates to fax listed above.   Benefits:  Phone #: [phone]     Name: 12/31 Eff. Date: 03/06/24 active    Deduct: none      Out of Pocket Max: $4200    Life Max: none CIR: $395 co pay per day days 1 until 6      SNF: no co pay days 1 until 20; $ 218 co pay per day days 21 until 100 Outpatient: $20 per visit     Co-Pay:  Home Health: 100%      Co-Pay:  DME: 80%     Co-Pay: 20% Providers: in network    [employer] Information Summary for patients in Inpatient Rehabilitation Facilities with attached Privacy Act Statement-Health Care Records was provided and verbally reviewed with: Patient and Family  Emergency Contact Information Contact Information     Name Relation Home Work Mobile   Broder,[name] Daughter [phone]  [phone]   Eriona, Kinchen   [phone]   Austin,Brandi Daughter   [phone]      Other Contacts   None on File    Current Medical History  Patient Admitting Diagnosis: debility s/p valve replacement and AKI  History of Present Illness: 68 yo female with history of nephrolithiasis otherwise healthy presents on 12/28/23 by private vehicle after leaving [facility] in [place], [name] where she was being treated for C. Albicans candidemia. Prior to that had underwent ureteral stent placement and lithotripsy at [facility] 12/14/23, then stent removed 10/14. She developed fever, chills and malaise. Went to [place] for a wedding.    Admitted with candidemia with mitral valve endocarditis. Underwent mitral valve repair on 01/10/24. Placed on micafungin  for ID for a treatment of 6 weeks. Heart failure team consulted for aggressive diuresis. Postop course complicated with post cardiotomy shock, junctional brady cardia , and afib with RVR.   Postop course complicated with severe mitral valve regurgitation  and underwent redo sternotomy and mitral valve replacement on 02/08/24. 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 Initially on CRRT and began IHD on 02/16/24. Extubated on 12/15.  Currently on MWF schedule. Left IJ TDC placed on 12/22 with IR. Remains on antifungal therapy. Managing volume with hemodialysis.. On oral amiodarone  and eliquis  for prior atrial fibrillation. Dobutamine  weaned off.     Protein -calorie malnutrition. On tube feeds. Remeron  to stimulate appetite. Does not want PEG.   Patient's medical record from [facility] has been reviewed by the rehabilitation admission coordinator and physician.  Past Medical History  Past Medical History:  Diagnosis Date   Medical history non-contributory    Thyroid  disease    Vertigo    Has the patient had major surgery during 100 days prior to admission? Yes  Family History   family history includes Diabetes in her father; Hypertension in her mother.  Current Medications Current Medications[1]  Patients Current  Diet:  Diet Order             Diet regular Room service appropriate? Yes; Fluid consistency: Thin  Diet effective now                  Precautions / Restrictions Precautions Precautions: Fall, Sternal Precaution Booklet Issued: No Precaution/Restrictions Comments: cortak Restrictions Weight Bearing Restrictions Per Provider Order: No RUE Weight Bearing Per Provider Order: Non weight bearing RUE Partial Weight Bearing Percentage or Pounds: 5 LUE Weight Bearing Per Provider Order: Non weight bearing LUE Partial Weight Bearing  Percentage or Pounds: 5 Other Position/Activity Restrictions: Cardiac sternal precautions   Has the patient had 2 or more falls or a fall with injury in the past year? No  Prior Activity Level Community (5-7x/wk): Independent, working as conservation officer, nature, driving  Prior Functional Level Self Care: Did the patient need help bathing, dressing, using the toilet or eating? Independent  Indoor Mobility: Did the patient need assistance with walking from room to room (with or without device)? Independent  Stairs: Did the patient need assistance with internal or external stairs (with or without device)? Independent  Functional Cognition: Did the patient need help planning regular tasks such as shopping or remembering to take medications? Independent  Patient Information Are you of Hispanic, Latino/a,or Spanish origin?: A. No, not of Hispanic, Latino/a, or Spanish origin What is your race?: A. White Do you need or want an interpreter to communicate with a doctor or health care staff?: 0. No  Patient's Response To:  Health Literacy and Transportation Is the patient able to respond to health literacy and transportation needs?: Yes Health Literacy - How often do you need to have someone help you when you read instructions, pamphlets, or other written material from your doctor or pharmacy?: Never In the past 12 months, has lack of transportation kept you from medical appointments or from getting medications?: No In the past 12 months, has lack of transportation kept you from meetings, work, or from getting things needed for daily living?: No  Home Assistive Devices / Equipment Home Equipment: None  Prior Device Use: Indicate devices/aids used by the patient prior to current illness, exacerbation or injury? None of the above  Current Functional Level Cognition  Orientation Level: Oriented X4    Extremity Assessment (includes Sensation/Coordination)  Upper Extremity Assessment: Generalized weakness   Lower Extremity Assessment: Defer to PT evaluation RLE Deficits / Details: no active movement; did respond/withdraw to noxious stimuli; no clonus noted; ROM limited by pitting edema throughout LLE Deficits / Details: spontaneous slight knee extension movement noted; did respond/withdraw to noxious stimuli; no clonus noted; ROM limited by pitting edema throughout    ADLs  Overall ADL's : Needs assistance/impaired Eating/Feeding: Minimal assistance, Sitting Eating/Feeding Details (indicate cue type and reason): ice chips Grooming: Contact guard assist, Standing Grooming Details (indicate cue type and reason): standing at sink Upper Body Bathing: Maximal assistance Upper Body Bathing Details (indicate cue type and reason): for back Lower Body Bathing: Total assistance, Bed level Lower Body Bathing Details (indicate cue type and reason): Cues for adhering to precautions Upper Body Dressing : Total assistance, Bed level Upper Body Dressing Details (indicate cue type and reason): verbally reviewed technique for UB dressing Lower Body Dressing: Moderate assistance Lower Body Dressing Details (indicate cue type and reason): mod A to hold BLE in figure 4 Toilet Transfer: Minimal assistance, +2 for physical assistance, Ambulation, Rolling walker (2 wheels) Toilet Transfer Details (indicate cue type and reason): with close  chair follow Toileting- Clothing Manipulation and Hygiene: Total assistance, +2 for physical assistance, +2 for safety/equipment, Sit to/from stand Toileting - Clothing Manipulation Details (indicate cue type and reason): Total +2 assist Functional mobility during ADLs: Minimal assistance, +2 for physical assistance, Rolling walker (2 wheels) General ADL Comments: dependent at this time    Mobility  Overal bed mobility: Needs Assistance Bed Mobility: Rolling, Sidelying to Sit, Sit to Sidelying Rolling: Min assist Sidelying to sit: Mod assist Supine to sit: Total assist, +2 for  physical assistance, +2 for safety/equipment, HOB elevated Sit to supine: Total assist, +2 for physical assistance Sit to sidelying: Min assist General bed mobility comments: with increased time and tactile cues, pt able to complete more movement with LE, dependent on assist at trunk    Transfers  Overall transfer level: Needs assistance Equipment used: Rolling walker (2 wheels) Transfers: Sit to/from Stand, Bed to chair/wheelchair/BSC Sit to Stand: Min assist, +2 physical assistance Bed to/from chair/wheelchair/BSC transfer type:: Step pivot Stand pivot transfers: Max assist, +2 safety/equipment Step pivot transfers: Contact guard assist General transfer comment: completed x4 in session with minA to rise, improved power up with hands on knees. max cues for sequencing sternal precautions    Ambulation / Gait / Stairs / Wheelchair Mobility  Ambulation/Gait Ambulation/Gait assistance: +2 safety/equipment, Min assist Gait Distance (Feet): 32 Feet (30ft + 59ft + 65ft) Assistive device: Rolling walker (2 wheels) Gait Pattern/deviations: Step-to pattern, Decreased stride length, Drifts right/left, Trunk flexed, Narrow base of support, Scissoring General Gait Details: pt with small steps and dependent on BUE support on walker. assist to guide walker, encourage trunk upright, and stride length. progressively decreased stride length in LLE and more frequent near-scissoring steps with fatigue Gait velocity: decreased Gait velocity interpretation: <1.31 ft/sec, indicative of household ambulator    Posture / Balance Dynamic Sitting Balance Sitting balance - Comments: CGA to minA to maintain sitting balance, mild posterior lean Balance Overall balance assessment: Needs assistance Sitting-balance support: No upper extremity supported, Feet unsupported Sitting balance-Leahy Scale: Good Sitting balance - Comments: CGA to minA to maintain sitting balance, mild posterior lean Postural control: Posterior  lean Standing balance support: Bilateral upper extremity supported, During functional activity Standing balance-Leahy Scale: Fair Standing balance comment: can tolerate static stance without UE support, BUE support for gait    Special considerations/life events  Pressure ulcer right buttocks [place] for Tube feeds New to hemodialysis this admission   Previous Home Environment  Living Arrangements: Alone  Lives With: Alone Available Help at Discharge: Family, Available 24 hours/day (dtr, [name] Jarvis, and son along with Aunt from [place] can provide 24/7) Type of Home: House Home Layout: One level Home Access: Stairs to enter Entrance Stairs-Rails: Left Entrance Stairs-Number of Steps: 5 Bathroom Shower/Tub: Health Visitor: Standard Bathroom Accessibility: Yes How Accessible: Accessible via walker Home Care Services: No  Discharge Living Setting Plans for Discharge Living Setting: Patient's home, House Type of Home at Discharge: House Discharge Home Layout: One level Discharge Home Access: Stairs to enter Entrance Stairs-Rails: Left Entrance Stairs-Number of Steps: 5 Discharge Bathroom Shower/Tub: Walk-in shower Discharge Bathroom Toilet: Standard Discharge Bathroom Accessibility: Yes How Accessible: Accessible via walker Does the patient have any problems obtaining your medications?: No  Social/Family/Support Systems Patient Roles: Parent Contact Information: [name], Daughter , main contact Anticipated Caregiver: [name] Jarvis and son, pt's Sister from [place] to come asisst at discharge Anticipated Caregiver's Contact Information: see contacts Ability/Limitations of Caregiver: children work but will arrange schedules Caregiver Availability: 24/7  Discharge Plan Discussed with Primary Caregiver: Yes Is Caregiver In Agreement with Plan?: Yes Does Caregiver/Family have Issues with Lodging/Transportation while Pt is in Rehab?: No  Goals Patient/Family Goal  for Rehab: supervision PT, OT and SLP Expected length of stay: ELOS 10 to 14 days Additional Information: Treatment for fungemia to be po fluconazole  at discharge Pt/Family Agrees to Admission and willing to participate: Yes Program Orientation Provided & Reviewed with Pt/Caregiver Including Roles  & Responsibilities: Yes  Barriers to Discharge: Insurance for SNF coverage  Decrease burden of Care through IP rehab admission: n/a  Possible need for SNF placement upon discharge: not anticipated  Patient Condition: I have reviewed medical records from [facility], spoken with  patient and daughter. I met with patient at the bedside and discussed via phone for inpatient rehabilitation assessment.  Patient will benefit from ongoing PT, OT, and SLP, can actively participate in 3 hours of therapy a day 5 days of the week, and can make measurable gains during the admission.  Patient will also benefit from the coordinated team approach during an Inpatient Acute Rehabilitation admission.  The patient will receive intensive therapy as well as Rehabilitation physician, nursing, social worker, and care management interventions.  Due to bladder management, bowel management, safety, skin/wound care, disease management, medication administration, pain management, and patient education the patient requires 24 hour a day rehabilitation nursing.  The patient is currently min/mod A with mobility and basic ADLs.  Discharge setting and therapy post discharge at home with home health is anticipated.  Patient has agreed to participate in the Acute Inpatient Rehabilitation Program and will admit today.  Preadmission Screen Completed By:  Alison Heron Lot, RN MSN1/04/2024 5:57 PM, Updates provided by Kristyn Conetta, PT 03/10/24 ______________________________________________________________________   Discussed status with Dr. Babs on 03/10/24 at 10:00 and received approval for admission today.  Admission  Coordinator:  Alison Heron Lot, RN MSN, updates provided by Cosmo Platter, PT time 11:00am/Date 03/10/24   Assessment/Plan: Diagnosis: debility Does the need for close, 24 hr/day Medical supervision in concert with the patient's rehab needs make it unreasonable for this patient to be served in a less intensive setting? Yes Co-Morbidities requiring supervision/potential complications: malnutrition, wound care, fungal esophagitis Due to bladder management, bowel management, safety, skin/wound care, disease management, medication administration, pain management, and patient education, does the patient require 24 hr/day rehab nursing? Yes Does the patient require coordinated care of a physician, rehab nurse, PT, OT, and SLP to address physical and functional deficits in the context of the above medical diagnosis(es)? Yes Addressing deficits in the following areas: balance, endurance, locomotion, strength, transferring, bowel/bladder control, bathing, dressing, feeding, grooming, toileting, and psychosocial support Can the patient actively participate in an intensive therapy program of at least 3 hrs of therapy 5 days a week? Yes The potential for patient to make measurable gains while on inpatient rehab is excellent Anticipated functional outcomes upon discharge from inpatient rehab: supervision PT, supervision OT, supervision SLP Estimated rehab length of stay to reach the above functional goals is: 10-14 day Anticipated discharge destination: Home 10. Overall Rehab/Functional Prognosis: excellent   MD Signature: Arthea IVAR Babs, MD, [facility] Physical Medicine & Rehabilitation Medical Director Rehabilitation Services 03/10/2024      [1]  Current Facility-Administered Medications:    acetaminophen  (TYLENOL ) tablet 1,000 mg, 1,000 mg, Oral, Q6H PRN, Raguel Con RAMAN, PA-C   albuterol  (PROVENTIL ) (2.5 MG/3ML) 0.083% nebulizer solution 2.5 mg, 2.5 mg, Nebulization, Q4H PRN,  Su, Con RAMAN, MD,  2.5 mg at 02/13/24 1053   alteplase  (CATHFLO ACTIVASE ) injection 2 mg, 2 mg, Intracatheter, Once PRN, Su, Con RAMAN, MD   amiodarone  (PACERONE ) tablet 200 mg, 200 mg, Oral, Daily, Raguel Con S, PA-C, 200 mg at 03/07/24 1558   anticoagulant sodium citrate  solution 5 mL, 5 mL, Intracatheter, PRN, Su, Con RAMAN, MD   antiseptic oral rinse (BIOTENE) solution 15 mL, 15 mL, Mouth Rinse, PRN, Cleatus Delayne GAILS, MD, 15 mL at 02/29/24 1408   apixaban  (ELIQUIS ) tablet 5 mg, 5 mg, Oral, BID, Sherryll Suzen SQUIBB, RPH, 5 mg at 03/06/24 2132   clonazePAM  (KLONOPIN ) tablet 0.5 mg, 0.5 mg, Oral, QHS, Chambers, Bailey S, PA-C, 0.5 mg at 03/06/24 2132   feeding supplement (ENSURE PLUS HIGH PROTEIN) liquid 237 mL, 237 mL, Oral, TID BM, Su, Bailey S, MD, 237 mL at 03/06/24 2000   feeding supplement (PROSource TF20) liquid 60 mL, 60 mL, Per Tube, BID, Kerrin Elspeth BROCKS, MD, 60 mL at 03/06/24 2132   feeding supplement (VITAL 1.5 CAL) liquid 1,000 mL, 1,000 mL, Per Tube, Q24H, Kerrin Elspeth BROCKS, MD, 1,000 mL at 03/06/24 1815   fluconazole  (DIFLUCAN ) IVPB 400 mg, 400 mg, Intravenous, Q24H, Kerrin Elspeth BROCKS, MD, Last Rate: 100 mL/hr at 03/06/24 1809, 400 mg at 03/06/24 1809   Gerhardt's butt cream, , Topical, BID, Daniel Con RAMAN, MD, Given at 03/06/24 [phone]   heparin  injection 1,000 Units, 1,000 Units, Intracatheter, PRN, Daniel Con RAMAN, MD, 1,000 Units at 03/07/24 1325   insulin  aspart (novoLOG ) injection 0-9 Units, 0-9 Units, Subcutaneous, Q4H, Daniel Con RAMAN, MD, 2 Units at 03/07/24 0457   insulin  glargine (LANTUS ) injection 15 Units, 15 Units, Subcutaneous, Daily, Su, Con RAMAN, MD, 15 Units at 03/07/24 1558   lidocaine  (PF) (XYLOCAINE ) 1 % injection 5 mL, 5 mL, Intradermal, PRN, Su, Con RAMAN, MD   lidocaine -prilocaine  (EMLA ) cream 1 Application, 1 Application, Topical, PRN, Su, Con RAMAN, MD   magic mouthwash, 5 mL, Oral, TID PRN, Raguel Con S, PA-C   megestrol  (MEGACE ) tablet 40 mg, 40 mg,  Oral, BID, Hendrickson, Steven C, MD, 40 mg at 03/06/24 2133   mirtazapine  (REMERON ) tablet 30 mg, 30 mg, Oral, QHS, Raguel Con RAMAN, PA-C, 30 mg at 03/06/24 2132   Oral care mouth rinse, 15 mL, Mouth Rinse, 4 times per day, Su, Bailey S, MD, 15 mL at 03/07/24 1559   Oral care mouth rinse, 15 mL, Mouth Rinse, PRN, Su, Con RAMAN, MD   pantoprazole  (PROTONIX ) EC tablet 40 mg, 40 mg, Oral, Daily, Su, Con RAMAN, MD, 40 mg at 03/07/24 1559   pentafluoroprop-tetrafluoroeth (GEBAUERS) aerosol 1 Application, 1 Application, Topical, PRN, Su, Con RAMAN, MD   polyethylene glycol (MIRALAX  / GLYCOLAX ) packet 17 g, 17 g, Per Tube, Daily PRN, Su, Con RAMAN, MD   promethazine  (PHENERGAN ) 6.25 mg/NS 50 mL IVPB, 6.25 mg, Intravenous, Q6H PRN, Daniel Con RAMAN, MD, Stopped at 02/28/24 2315   QUEtiapine  (SEROQUEL ) tablet 25 mg, 25 mg, Oral, QHS, Raguel Con RAMAN, PA-C, 25 mg at 03/06/24 2131   sertraline  (ZOLOFT ) tablet 25 mg, 25 mg, Oral, QHS, Raguel Con RAMAN, PA-C, 25 mg at 03/06/24 2131   simethicone  (MYLICON) chewable tablet 80 mg, 80 mg, Oral, QID PRN, Raguel Con RAMAN, PA-C   sodium chloride  flush (NS) 0.9 % injection 10-40 mL, 10-40 mL, Intracatheter, Q12H, Su, Bailey S, MD, 10 mL at 03/07/24 0800   sodium chloride  flush (NS) 0.9 % injection 10-40 mL, 10-40 mL, Intracatheter, PRN, Su, Con RAMAN, MD

## 2024-03-04 NOTE — Progress Notes (Signed)
 "     Advanced Heart Failure Rounding Note  Cardiologist: Lonni Cash, MD  HF Cardiologist: Dr. Zenaida Chief Complaint: S/p MV Repair / MV endocarditis  Patient Profile  Tracy Baker is a 68 y.o. female with hx of recent nephrolithiasis s/p ureteral stent and lithotripsy at [facility] 10/10 with stent removal. 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 Post-op course c/b post cardiotomy shock, junctional bradycardia, afib w/ RVR and HAP.  Significant events:   12/2 RHC (on 0.125 of Milrinone ): RA 4, PA 42/13 (22), PCW 12 with Vwaves to 20, CO/CI TD 3.37/2.03, PAPi 7.5 12/5 S/P Redo MVR - Intra Op given  4UPRBCs, 2UPLTs, 2FFP, 1Cyro 12/6 Protek Duo cannulation 12/8 Decannulated.  Post-op echo showed EF 50%, RV mildly dilated and mildly dysfunctional, s/p bioprosthetic mitral valve (stable). 12/13 CVVH begun 12/15: extubated 12/16: CRRT stopped.  12/17: Started DBA wean 12/19: Failed lasix  challenge. iHD 12/22: TDC placed 12/29: DBA stopped Subjective:    Now off DBA. Co-ox 68%  3L off with iHD 12/29. Weight back down to admission weight.   Feels better today. Sitting up in chair, just worked with PT.   Objective:    Weight Range: 66.1 kg Body mass index is 25.01 kg/m.   Vital Signs:   Temp:  [97.6 F (36.4 C)-98.3 F (36.8 C)] 97.9 F (36.6 C) (12/30 0826) Pulse Rate:  [64-86] 64 (12/30 0826) Resp:  [11-16] 14 (12/30 0826) BP: (93-124)/(60-69) 113/66 (12/30 0826) SpO2:  [93 %-100 %] 97 % (12/30 0826) Weight:  [66.1 kg] 66.1 kg (12/30 0243) Last BM Date : 03/02/24  Weight change: Filed Weights   03/03/24 0400 03/03/24 0857 03/04/24 0243  Weight: 66 kg 67.8 kg 66.1 kg   Intake/Output:  Intake/Output Summary (Last 24 hours) at 03/04/2024 0859 Last data filed at 03/04/2024 0400 Gross per 24 hour  Intake 800.2 ml  Output 3000 ml  Net -2199.8 ml   Physical Exam   General:  chronically ill appearing.  No respiratory  difficulty. +cortrak Neck: JVD ~6 cm.  Cor: Regular rate & rhythm. No murmurs. Lungs: clear, diminished bases Extremities: no edema  Neuro: alert & oriented x 3. Affect flat.   Telemetry   AF 80s rare PVCs (personally reviewed)  Labs    CBC Recent Labs    03/03/24 0450 03/04/24 0520  WBC 4.3 4.9  HGB 9.6* 10.4*  HCT 29.7* 31.9*  MCV 98.7 98.2  PLT 136* 162   Basic Metabolic Panel Recent Labs    87/70/74 0450 03/04/24 0520 03/04/24 0521  NA 133*  --  134*  K 5.2*  --  4.3  CL 93*  --  93*  CO2 25  --  27  GLUCOSE 179*  --  141*  BUN 80*  --  52*  CREATININE 3.24*  --  2.43*  CALCIUM  9.3  --  9.5  MG 2.2 2.1  --   PHOS 5.1*  --  4.6   Liver Function Tests Recent Labs    03/03/24 0450 03/04/24 0520 03/04/24 0521  AST 16 16  --   ALT 10 10  --   ALKPHOS 279* 263*  --   BILITOT 0.8 0.9  --   PROT 6.3* 6.5  --   ALBUMIN  3.7  3.7 3.8 3.8   Medications:    Scheduled Medications:  amiodarone   200 mg Per Tube BID   apixaban   5 mg Oral BID   clonazePAM   0.5 mg Per Tube QHS  feeding supplement  237 mL Oral TID BM   feeding supplement (PROSource TF20)  60 mL Per Tube BID   feeding supplement (VITAL 1.5 CAL)  1,000 mL Per Tube Q24H   Gerhardt's butt cream   Topical BID   insulin  aspart  0-9 Units Subcutaneous Q4H   insulin  glargine  15 Units Subcutaneous Daily   mirtazapine   30 mg Per Tube QHS   mouth rinse  15 mL Mouth Rinse 4 times per day   pantoprazole   40 mg Oral Daily   QUEtiapine   25 mg Per Tube QHS   sertraline   25 mg Per Tube QHS   sodium chloride  flush  10-40 mL Intracatheter Q12H    Infusions:  anticoagulant sodium citrate      fluconazole  (DIFLUCAN ) IV Stopped (03/03/24 2150)   And   fluconazole  (DIFLUCAN ) IV Stopped (03/03/24 2012)   promethazine  (PHENERGAN ) injection (IM or IVPB) Stopped (02/28/24 2315)    PRN Medications: acetaminophen , albuterol , alteplase , anticoagulant sodium citrate , antiseptic oral rinse, heparin , lidocaine   (PF), lidocaine -prilocaine , magic mouthwash, mouth rinse, pentafluoroprop-tetrafluoroeth, polyethylene glycol, promethazine  (PHENERGAN ) injection (IM or IVPB), simethicone , sodium chloride  flush  Assessment/Plan   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 Kerrin for candida endocarditis. Long-term fluconazole  per ID.  - Pre-op L/RHC 01/04/24 with no CAD and normal L/R heart pressures.  - Failed initial Milrinone  wean. Eventually weaned off, unfortunately restarted 12/1 for low output.  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 - Echo 12/19 EF 60% RV mildly HK  - Co-ox 68%. DBA stopped 12/29. Volume status improving. Back down to admission weight.  - CVVH initially stopped 12/16. Failed lasix  challenge. Started iHD 12/19, tolerating well. Next session 12/31.  Acute hypoxic respiratory failure: Suspect component of infection/aspiration PNA given unilateral disease.  - Respiratory culture- Enterobacter Completed cefepime  and vanc. - stable on RA  Coagulopathy/thrombocytopenia/ABLA anemia:  - Slowly improving. - Suspect this is inflammatory due to critical illness.   Sick sinus syndrome - Initially with asystole underneath (as of 12/10) - Now with atrial fibrillation   Afib, persistent - Hep gtt stopped 12/29. Back on Eliquis  5 mg BID - completed amio load, continue 200 mg bid. No cardioversion for now with concerns for bradycardia.  - rate controlled  AKI on CKD 3b - Stopped CVVH 12/16. Failed lasix  challenge. Started iHD 12/19. Tolerated well. - plan as above  Protein-calorie  malnutrition - On TF. Adjust feeds as able to help promote more po intake - Remeron  to stimulate appetite - Does not want PEG tube.   Pressure Ulcer R Buttock, DTI - Q2 turns, offload  Left radial artery occlusion - Left ulnar artery is patent.  - Back on Eliquis  5 mg BID   Beckey LITTIE Coe, NP 8:59 AM  Advanced Heart Failure Team Pager [phone] (M-F; 7a - 5p)    Please visit [email or web address]: For overnight coverage please call cardiology fellow first. If fellow not available call Shock/ECMO MD on call.  For ECMO / Mechanical Support (Impella, IABP, LVAD) issues call Shock / ECMO MD on call.   "

## 2024-03-05 ENCOUNTER — Other Ambulatory Visit: Payer: Self-pay | Admitting: Cardiology

## 2024-03-05 DIAGNOSIS — Z952 Presence of prosthetic heart valve: Secondary | ICD-10-CM

## 2024-03-05 DIAGNOSIS — B377 Candidal sepsis: Secondary | ICD-10-CM | POA: Diagnosis not present

## 2024-03-05 LAB — CBC
HCT: 31.1 % — ABNORMAL LOW (ref 36.0–46.0)
Hemoglobin: 10.1 g/dL — ABNORMAL LOW (ref 12.0–15.0)
MCH: 32.1 pg (ref 26.0–34.0)
MCHC: 32.5 g/dL (ref 30.0–36.0)
MCV: 98.7 fL (ref 80.0–100.0)
Platelets: 153 K/uL (ref 150–400)
RBC: 3.15 MIL/uL — ABNORMAL LOW (ref 3.87–5.11)
RDW: 18.4 % — ABNORMAL HIGH (ref 11.5–15.5)
WBC: 4.7 K/uL (ref 4.0–10.5)
nRBC: 0 % (ref 0.0–0.2)

## 2024-03-05 LAB — RENAL FUNCTION PANEL
Albumin: 3.8 g/dL (ref 3.5–5.0)
Anion gap: 18 — ABNORMAL HIGH (ref 5–15)
BUN: 85 mg/dL — ABNORMAL HIGH (ref 8–23)
CO2: 22 mmol/L (ref 22–32)
Calcium: 9.7 mg/dL (ref 8.9–10.3)
Chloride: 91 mmol/L — ABNORMAL LOW (ref 98–111)
Creatinine, Ser: 3.32 mg/dL — ABNORMAL HIGH (ref 0.44–1.00)
GFR, Estimated: 14 mL/min — ABNORMAL LOW
Glucose, Bld: 134 mg/dL — ABNORMAL HIGH (ref 70–99)
Phosphorus: 6.1 mg/dL — ABNORMAL HIGH (ref 2.5–4.6)
Potassium: 4.6 mmol/L (ref 3.5–5.1)
Sodium: 132 mmol/L — ABNORMAL LOW (ref 135–145)

## 2024-03-05 LAB — GLUCOSE, CAPILLARY
Glucose-Capillary: 107 mg/dL — ABNORMAL HIGH (ref 70–99)
Glucose-Capillary: 117 mg/dL — ABNORMAL HIGH (ref 70–99)
Glucose-Capillary: 120 mg/dL — ABNORMAL HIGH (ref 70–99)
Glucose-Capillary: 136 mg/dL — ABNORMAL HIGH (ref 70–99)
Glucose-Capillary: 143 mg/dL — ABNORMAL HIGH (ref 70–99)
Glucose-Capillary: 143 mg/dL — ABNORMAL HIGH (ref 70–99)
Glucose-Capillary: 147 mg/dL — ABNORMAL HIGH (ref 70–99)

## 2024-03-05 LAB — MAGNESIUM: Magnesium: 2.4 mg/dL (ref 1.7–2.4)

## 2024-03-05 LAB — COOXEMETRY PANEL
Carboxyhemoglobin: 2.6 % — ABNORMAL HIGH (ref 0.5–1.5)
Methemoglobin: <0.7 % (ref 0.0–1.5)
O2 Saturation: 64.4 %
Total hemoglobin: 10.5 g/dL — ABNORMAL LOW (ref 12.0–16.0)

## 2024-03-05 LAB — HEPATIC FUNCTION PANEL
ALT: 10 U/L (ref 0–44)
AST: 15 U/L (ref 15–41)
Albumin: 3.8 g/dL (ref 3.5–5.0)
Alkaline Phosphatase: 241 U/L — ABNORMAL HIGH (ref 38–126)
Bilirubin, Direct: 0.4 mg/dL — ABNORMAL HIGH (ref 0.0–0.2)
Indirect Bilirubin: 0.4 mg/dL (ref 0.3–0.9)
Total Bilirubin: 0.8 mg/dL (ref 0.0–1.2)
Total Protein: 6.4 g/dL — ABNORMAL LOW (ref 6.5–8.1)

## 2024-03-05 MED ORDER — AMIODARONE HCL 200 MG PO TABS
200.0000 mg | ORAL_TABLET | Freq: Every day | ORAL | Status: DC
  Administered 2024-03-05 – 2024-03-10 (×6): 200 mg via ORAL
  Filled 2024-03-05 (×7): qty 1

## 2024-03-05 MED ORDER — SERTRALINE HCL 25 MG PO TABS
25.0000 mg | ORAL_TABLET | Freq: Every day | ORAL | Status: DC
  Administered 2024-03-05 – 2024-03-09 (×5): 25 mg via ORAL
  Filled 2024-03-05 (×5): qty 1

## 2024-03-05 MED ORDER — HEPARIN SODIUM (PORCINE) 1000 UNIT/ML IJ SOLN
INTRAMUSCULAR | Status: AC
  Filled 2024-03-05: qty 5

## 2024-03-05 MED ORDER — CLONAZEPAM 0.5 MG PO TABS
0.5000 mg | ORAL_TABLET | Freq: Every day | ORAL | Status: DC
  Administered 2024-03-05 – 2024-03-09 (×5): 0.5 mg via ORAL
  Filled 2024-03-05 (×5): qty 1

## 2024-03-05 MED ORDER — QUETIAPINE FUMARATE 50 MG PO TABS
25.0000 mg | ORAL_TABLET | Freq: Every day | ORAL | Status: DC
  Administered 2024-03-05 – 2024-03-09 (×5): 25 mg via ORAL
  Filled 2024-03-05 (×5): qty 1

## 2024-03-05 MED ORDER — MIRTAZAPINE 7.5 MG PO TABS
30.0000 mg | ORAL_TABLET | Freq: Every day | ORAL | Status: DC
  Administered 2024-03-05 – 2024-03-08 (×4): 30 mg via ORAL
  Filled 2024-03-05 (×4): qty 4

## 2024-03-05 MED ORDER — MEGESTROL ACETATE 40 MG PO TABS
40.0000 mg | ORAL_TABLET | Freq: Two times a day (BID) | ORAL | Status: DC
  Administered 2024-03-05 – 2024-03-08 (×7): 40 mg via ORAL
  Filled 2024-03-05 (×9): qty 1

## 2024-03-05 MED ORDER — SIMETHICONE 80 MG PO CHEW: 80.0000 mg | CHEWABLE_TABLET | Freq: Four times a day (QID) | ORAL | Status: DC | PRN

## 2024-03-05 NOTE — Progress Notes (Signed)
 "     Advanced Heart Failure Rounding Note  Cardiologist: Lonni Cash, MD  HF Cardiologist: Dr. Zenaida Chief Complaint: S/p MV Repair / MV endocarditis  Patient Profile  Tracy Baker is a 68 y.o. female with hx of recent nephrolithiasis s/p ureteral stent and lithotripsy at [facility] 10/10 with stent removal. 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 Post-op course c/b post cardiotomy shock, junctional bradycardia, afib w/ RVR and HAP.  Significant events:   12/2 RHC (on 0.125 of Milrinone ): RA 4, PA 42/13 (22), PCW 12 with Vwaves to 20, CO/CI TD 3.37/2.03, PAPi 7.5 12/5 S/P Redo MVR - Intra Op given  4UPRBCs, 2UPLTs, 2FFP, 1Cyro 12/6 Protek Duo cannulation 12/8 Decannulated.  Post-op echo showed EF 50%, RV mildly dilated and mildly dysfunctional, s/p bioprosthetic mitral valve (stable). 12/13 CVVH begun 12/15: extubated 12/16: CRRT stopped.  12/17: Started DBA wean 12/19: Failed lasix  challenge. iHD 12/22: TDC placed 12/29: DBA stopped Subjective:    Now off DBA. Co-ox 64%  Weight back down to admission weight. On iHD MWF.   Resting comfortably in bed.   Objective:    Weight Range: 65.7 kg Body mass index is 24.86 kg/m.   Vital Signs:   Temp:  [97.7 F (36.5 C)-98.1 F (36.7 C)] 97.7 F (36.5 C) (12/31 0700) Pulse Rate:  [62-72] 64 (12/31 0700) Resp:  [10-18] 14 (12/31 0700) BP: (95-117)/(60-66) 116/66 (12/31 0300) SpO2:  [96 %-98 %] 96 % (12/31 0300) Weight:  [65.7 kg] 65.7 kg (12/31 0300) Last BM Date : 03/02/24  Weight change: Filed Weights   03/03/24 0857 03/04/24 0243 03/05/24 0300  Weight: 67.8 kg 66.1 kg 65.7 kg   Intake/Output: No intake or output data in the 24 hours ending 03/05/24 1101  Physical Exam   General:  chronically ill appearing.  No respiratory difficulty. +cortrak Neck: JVD ~6/7 cm.  Cor: Regular rate & irregular rhythm. No murmurs. Lungs: clear, diminished bases Extremities: no edema  Neuro:  alert & oriented x 3. Affect flat.   Telemetry   AF 60s rare PVCs (personally reviewed)  Labs    CBC Recent Labs    03/04/24 0520 03/05/24 0559  WBC 4.9 4.7  HGB 10.4* 10.1*  HCT 31.9* 31.1*  MCV 98.2 98.7  PLT 162 153   Basic Metabolic Panel Recent Labs    87/69/74 0520 03/04/24 0521 03/05/24 0559  NA  --  134* 132*  K  --  4.3 4.6  CL  --  93* 91*  CO2  --  27 22  GLUCOSE  --  141* 134*  BUN  --  52* 85*  CREATININE  --  2.43* 3.32*  CALCIUM   --  9.5 9.7  MG 2.1  --  2.4  PHOS  --  4.6 6.1*   Liver Function Tests Recent Labs    03/04/24 0520 03/04/24 0521 03/05/24 0559  AST 16  --  15  ALT 10  --  10  ALKPHOS 263*  --  241*  BILITOT 0.9  --  0.8  PROT 6.5  --  6.4*  ALBUMIN  3.8 3.8 3.8  3.8   Medications:    Scheduled Medications:  amiodarone   200 mg Oral Daily   apixaban   5 mg Oral BID   clonazePAM   0.5 mg Per Tube QHS   feeding supplement  237 mL Oral TID BM   feeding supplement (PROSource TF20)  60 mL Per Tube BID   feeding supplement (VITAL 1.5 CAL)  1,000 mL Per Tube Q24H   Gerhardt's butt cream   Topical BID   insulin  aspart  0-9 Units Subcutaneous Q4H   insulin  glargine  15 Units Subcutaneous Daily   megestrol  40 mg Oral BID   mirtazapine   30 mg Per Tube QHS   mouth rinse  15 mL Mouth Rinse 4 times per day   pantoprazole   40 mg Oral Daily   QUEtiapine   25 mg Per Tube QHS   sertraline   25 mg Per Tube QHS   sodium chloride  flush  10-40 mL Intracatheter Q12H    Infusions:  anticoagulant sodium citrate      fluconazole  (DIFLUCAN ) IV 400 mg (03/04/24 1944)   promethazine  (PHENERGAN ) injection (IM or IVPB) Stopped (02/28/24 2315)    PRN Medications: acetaminophen , albuterol , alteplase , anticoagulant sodium citrate , antiseptic oral rinse, heparin , lidocaine  (PF), lidocaine -prilocaine , magic mouthwash, mouth rinse, pentafluoroprop-tetrafluoroeth, polyethylene glycol, promethazine  (PHENERGAN ) injection (IM or IVPB), simethicone , sodium  chloride flush  Assessment/Plan   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 Kerrin for candida endocarditis. Long-term fluconazole  per ID.  - Pre-op L/RHC 01/04/24 with no CAD and normal L/R heart pressures.  - Failed initial Milrinone  wean. Eventually weaned off, unfortunately restarted 12/1 for low output.  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 - Echo 12/19 EF 60% RV mildly HK  - Co-ox 64%. DBA stopped 12/29. Volume status stable on iHD MWF. Back down to admission weight. Will see how she tolerated fluid removal today now that she's off DBA.  - CVVH initially stopped 12/16. Failed lasix  challenge. Started iHD 12/19, tolerating well. Next session 12/31.  Acute hypoxic respiratory failure: Suspect component of infection/aspiration PNA given unilateral disease.  - Respiratory culture- Enterobacter Completed cefepime  and vanc. - stable on RA  Coagulopathy/thrombocytopenia/ABLA anemia:  - Slowly improving. - Suspect this is inflammatory due to critical illness.   Sick sinus syndrome - Initially with asystole underneath (as of 12/10) - Now with atrial fibrillation   Afib, persistent - Hep gtt stopped 12/29. Back on Eliquis  5 mg BID - completed amio load, continue 200 mg daily. No cardioversion for now with concerns for bradycardia.  - rate controlled  AKI on CKD 3b - Stopped CVVH 12/16. Failed lasix  challenge. Started iHD 12/19. Tolerated well. - plan as above  Protein-calorie malnutrition - On TF. Adjust feeds as able to help promote more po intake - Remeron  to stimulate appetite -  Does not want PEG tube.   Pressure Ulcer R Buttock, DTI - Q2 turns, offload  Left radial artery occlusion - Left ulnar artery is patent.  - Back on Eliquis  5 mg BID   Beckey LITTIE Coe, NP 11:01 AM  Advanced Heart Failure Team Pager [phone] (M-F; 7a - 5p)    Please visit [email or web address]: For overnight coverage please call cardiology fellow first. If fellow not available call Shock/ECMO MD on call.  For ECMO / Mechanical Support (Impella, IABP, LVAD) issues call Shock / ECMO MD on call.   "

## 2024-03-05 NOTE — Progress Notes (Signed)
" ° °  Inpatient Rehabilitation Admissions Coordinator   I have begun Auth this am with [facility] Medicare for possible Cir admit pending their approval.  Heron Leavell, RN, MSN Rehab Admissions Coordinator [phone] 03/05/2024 10:43 AM  "

## 2024-03-05 NOTE — Plan of Care (Signed)
  Problem: Clinical Measurements: Goal: Will remain free from infection Outcome: Progressing   Problem: Coping: Goal: Level of anxiety will decrease Outcome: Progressing   Problem: Pain Managment: Goal: General experience of comfort will improve and/or be controlled Outcome: Progressing   Problem: Safety: Goal: Ability to remain free from injury will improve Outcome: Progressing

## 2024-03-05 NOTE — Progress Notes (Signed)
 " Tracy Baker Progress Note    Assessment/ Plan:   Dialysis dependent AKI, initiated 02/16/2024 Currently on MWF schedule  Following UOP and labs and intradialytic window for any suggestive GFR recovery, none to date Creatinine normal at baseline Currently using Left IJ TDC placed on 12/22 with IR.   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 On antifungal therapy  AHRF: Resolved on room air  Anemia -transfuse PRN for Hgb <7; Hb 10s  Hyponatremia: mild and intermittent.  Manage volume with dialysis  Hyperkalemia: Stable/resolved   Subjective:   No interval events. Still in process to determine CIR or SNF at discharge For HD today No documented UOP BUN and creatinine increasing any intradialytic window   Objective:   BP 116/66 (BP Location: Left Arm)   Pulse 64   Temp 97.7 F (36.5 C) (Oral)   Resp 14   Ht 5' 4 (1.626 m)   Wt 65.7 kg   SpO2 96%   BMI 24.86 kg/m  No intake or output data in the 24 hours ending 03/05/24 0851  Weight change: -2.1 kg  Physical Exam: Gen: ill/tired appearing, NAD, lying flat in bed CVS: normal rate, no rub Resp: bl chest rise, normal wob Abd: soft Ext: No significant edema noted Dialysis access:IJ tunneled  HD cath  Imaging: DG Chest Port 1 View Result Date: 03/04/2024 CLINICAL DATA:  Status post mitral valve replacement. Pleural effusion. EXAM: PORTABLE CHEST 1 VIEW COMPARISON:  02/22/2024 FINDINGS: Right upper extremity PICC tip overlies the upper SVC. Left-sided dialysis catheter tip overlies the lower SVC. Weighted enteric tube tip below the diaphragm not included in the field of view. Post median sternotomy with prosthetic cardiac valve. The heart is upper normal in size. Stable mild pulmonary edema. Suspected small pleural effusions. Slight increase in atelectasis in the lung bases. No pneumothorax. Overlying skin staples have  been removed. IMPRESSION: 1. Stable mild pulmonary edema. Suspected small pleural effusions. 2. Slight increase in atelectasis in the lung bases. Electronically Signed   By: Andrea Gasman M.D.   On: 03/04/2024 16:00       Labs: BMET Recent Labs  Lab 02/28/24 0355 02/29/24 0458 03/01/24 0445 03/02/24 0440 03/03/24 0450 03/04/24 0521 03/05/24 0559  NA 133* 132* 133* 133* 133* 134* 132*  K 5.2* 5.6* 4.8 4.2 5.2* 4.3 4.6  CL 94* 93* 93* 92* 93* 93* 91*  CO2 27 25 27 28 25 27 22   GLUCOSE 152* 148* 148* 160* 179* 141* 134*  BUN 60* 88* 58* 44* 80* 52* 85*  CREATININE 2.46* 3.29* 2.60* 2.25* 3.24* 2.43* 3.32*  CALCIUM  9.1 9.3 9.1 9.3 9.3 9.5 9.7  PHOS 4.2 5.3* 4.9* 4.2 5.1* 4.6 6.1*   CBC Recent Labs  Lab 03/02/24 0440 03/03/24 0450 03/04/24 0520 03/05/24 0559  WBC 4.4 4.3 4.9 4.7  HGB 10.1* 9.6* 10.4* 10.1*  HCT 31.6* 29.7* 31.9* 31.1*  MCV 99.1 98.7 98.2 98.7  PLT 147* 136* 162 153    Medications:     amiodarone   200 mg Oral Daily   apixaban   5 mg Oral BID   clonazePAM   0.5 mg Per Tube QHS   feeding supplement  237 mL Oral TID BM   feeding supplement (PROSource TF20)  60 mL Per Tube BID   feeding supplement (VITAL 1.5 CAL)  1,000 mL Per Tube Q24H   Gerhardt's butt cream   Topical  BID   insulin  aspart  0-9 Units Subcutaneous Q4H   insulin  glargine  15 Units Subcutaneous Daily   megestrol  40 mg Oral BID   mirtazapine   30 mg Per Tube QHS   mouth rinse  15 mL Mouth Rinse 4 times per day   pantoprazole   40 mg Oral Daily   QUEtiapine   25 mg Per Tube QHS   sertraline   25 mg Per Tube QHS   sodium chloride  flush  10-40 mL Intracatheter Q12H       "

## 2024-03-05 NOTE — Progress Notes (Signed)
 Ordering 6 week echo s/p MVR  Results to Dr. Kerrin and Dr. Verlin

## 2024-03-05 NOTE — Progress Notes (Signed)
 Physical Therapy Treatment Patient Details Name: Tracy Baker MRN: [id number] DOB: [birth year] Today's Date: 03/05/2024   History of Present Illness 68 y.o. female adm 12/29/2023 after leaving AMA from [facility] (where she was on vacation due to lack of formal transfer) where she was being treated for C. Albicans candidemia. 10/28 TEE with mitral valve vegetation. 11/3 teeth Extractions. 11/6 Mitral valve repair. 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 Extubated 12/15. PMHx: thyroid  disease.    PT Comments  Pt agreeable to session with focus on ambulation distance and endurance. She was able to complete x3 walking bouts for a total distance of 77ft with use of RW and minA, but continues to need seated rest breaks (2-6min) to recover between bouts. VSS with all activity. Pt also continues to demo deficits in LE strength limiting ability to power up to standing without assist, but demos improved power and hip clearance with hands on her knees and use of momentum. Completed x4 in session. Recommendations remain appropriate, pt is making slow but steady progress with activity tolerance, endurance, and stregnth.     If plan is discharge home, recommend the following: Assistance with cooking/housework;Assist for transportation;Help with stairs or ramp for entrance;Two people to help with walking and/or transfers;Two people to help with bathing/dressing/bathroom;Assistance with feeding;Direct supervision/assist for medications management;Direct supervision/assist for financial management   Can travel by private vehicle        Equipment Recommendations  Rolling walker (2 wheels)    Recommendations for Other Services       Precautions / Restrictions Precautions Precautions: Fall;Sternal Precaution Booklet Issued: No Recall of Precautions/Restrictions: Impaired Precaution/Restrictions Comments: cortak Restrictions Weight Bearing  Restrictions Per Provider Order: No Other Position/Activity Restrictions: Cardiac sternal precautions     Mobility  Bed Mobility Overal bed mobility: Needs Assistance Bed Mobility: Rolling, Sidelying to Sit, Sit to Sidelying Rolling: Min assist Sidelying to sit: Mod assist     Sit to sidelying: Min assist General bed mobility comments: with increased time and tactile cues, pt able to complete more movement with LE, dependent on assist at trunk    Transfers Overall transfer level: Needs assistance Equipment used: Rolling walker (2 wheels) Transfers: Sit to/from Stand, Bed to chair/wheelchair/BSC Sit to Stand: Min assist, +2 physical assistance   Step pivot transfers: Contact guard assist       General transfer comment: completed x4 in session with minA to rise, improved power up with hands on knees. max cues for sequencing sternal precautions    Ambulation/Gait Ambulation/Gait assistance: +2 safety/equipment, Min assist Gait Distance (Feet): 32 Feet (22ft + 76ft + 61ft) Assistive device: Rolling walker (2 wheels) Gait Pattern/deviations: Step-to pattern, Decreased stride length, Drifts right/left, Trunk flexed, Narrow base of support, Scissoring Gait velocity: decreased Gait velocity interpretation: <1.31 ft/sec, indicative of household ambulator   General Gait Details: pt with small steps and dependent on BUE support on walker. assist to guide walker, encourage trunk upright, and stride length. progressively decreased stride length in LLE and more frequent near-scissoring steps with fatigue      Balance Overall balance assessment: Needs assistance Sitting-balance support: No upper extremity supported, Feet unsupported Sitting balance-Leahy Scale: Good     Standing balance support: Bilateral upper extremity supported, During functional activity Standing balance-Leahy Scale: Fair Standing balance comment: can tolerate static stance without UE support, BUE support for  gait  Communication Communication Communication: Impaired  Cognition Arousal: Alert Behavior During Therapy: Flat affect   PT - Cognitive impairments: Awareness, Attention, Memory, Initiation, Sequencing, Problem solving, Safety/Judgement                       PT - Cognition Comments: pt with flat affect and limited verbal contribution until directly prompted. able to follow simple commands with increased time. showing limited carryover of transfer techniques Following commands: Impaired Following commands impaired: Follows one step commands inconsistently, Follows one step commands with increased time    Cueing Cueing Techniques: Tactile cues, Gestural cues, Verbal cues  Exercises      General Comments General comments (skin integrity, edema, etc.): VSS on RA      Pertinent Vitals/Pain Pain Assessment Pain Assessment: No/denies pain     PT Goals (current goals can now be found in the care plan section) Acute Rehab PT Goals Patient Stated Goal: agreeable to session, did not state a goal Progress towards PT goals: Progressing toward goals    Frequency    Min 3X/week       AM-PAC PT 6 Clicks Mobility   Outcome Measure  Help needed turning from your back to your side while in a flat bed without using bedrails?: A Lot Help needed moving from lying on your back to sitting on the side of a flat bed without using bedrails?: A Lot Help needed moving to and from a bed to a chair (including a wheelchair)?: A Little Help needed standing up from a chair using your arms (e.g., wheelchair or bedside chair)?: A Lot Help needed to walk in hospital room?: A Lot Help needed climbing 3-5 steps with a railing? : Total 6 Click Score: 12    End of Session Equipment Utilized During Treatment: Gait belt Activity Tolerance: Patient limited by fatigue;Patient tolerated treatment well Patient left: with Baker bell/phone within reach;in  bed;with bed alarm set Nurse Communication: Mobility status PT Visit Diagnosis: Other abnormalities of gait and mobility (R26.89);Muscle weakness (generalized) (M62.81);Difficulty in walking, not elsewhere classified (R26.2);Unsteadiness on feet (R26.81)     Time: 9157-9091 PT Time Calculation (min) (ACUTE ONLY): 26 min  Charges:    $Gait Training: 8-22 mins $Therapeutic Exercise: 8-22 mins PT General Charges $$ ACUTE PT VISIT: 1 Visit                     Tracy Baker, PT, DPT   Acute Rehabilitation Department Office [phone] Secure Chat Communication Preferred   Tracy Baker 03/05/2024, 10:21 AM

## 2024-03-05 NOTE — Progress Notes (Signed)
" °   03/05/24 1617  Vitals  Temp 98.3 F (36.8 C)  Temp Source Oral  BP (!) 114/51  BP Location Left Arm  BP Method Automatic  Pulse Rate 84  Pulse Rate Source Monitor  ECG Heart Rate 85  Resp 20  Weight 64.2 kg  Type of Weight Post-Dialysis  Oxygen Therapy  SpO2 98 %  O2 Device Room Air  Pulse Oximetry Type Continuous  Oximetry Probe Site Changed No  During Treatment Monitoring  Blood Flow Rate (mL/min) 349 mL/min  Arterial Pressure (mmHg) -250.29 mmHg  Venous Pressure (mmHg) 181.61 mmHg  TMP (mmHg) 8.28 mmHg  Ultrafiltration Rate (mL/min) 0 mL/min  Dialysate Flow Rate (mL/min) 300 ml/min  Dialysate Potassium Concentration 2  Dialysate Calcium  Concentration 2.5  Duration of HD Treatment -hour(s) 3.14 hour(s)  Cumulative Fluid Removed (mL) per Treatment  2527.15  HD Safety Checks Performed Yes  Intra-Hemodialysis Comments Tx completed;Tolerated well (Came off 21 min ealry was feeling bad tolerated overall)  Post Treatment  Dialyzer Clearance Clear  Liters Processed 66.6  Fluid Removed (mL) 2500 mL  Tolerated HD Treatment Yes  Hemodialysis Catheter Left Internal jugular Double lumen Permanent (Tunneled)  Placement Date/Time: 02/25/24 1348   Serial / Lot #: [id number]  Expiration Date: 08/03/28  Time Out: Correct patient;Correct site;Correct procedure  Maximum sterile barrier precautions: Hand hygiene;Cap;Mask;Sterile gown;Sterile gloves;Large sterile s...  Site Condition No complications  Blue Lumen Status Heparin  locked;Antimicrobial dead end cap  Red Lumen Status Heparin  locked;Dead end cap in place  Catheter fill solution Heparin  1000 units/ml  Catheter fill volume (Arterial) 2.1 cc  Catheter fill volume (Venous) 2.1  Dressing Type Transparent  Dressing Status Clean, Dry, Intact;Antimicrobial disc/dressing in place  Interventions Dressing changed  Dressing Change Due 03/12/24  Post treatment catheter status Capped and Clamped    "

## 2024-03-05 NOTE — Progress Notes (Addendum)
 "     [address] [place] [address]             [phone]      25 Days Post-Op Procedures (LRB): ECMO CANNULATION (N/A) Subjective: Patient seems much more alert this morning, reports she ate a grilled cheese and some fruit yesterday  Objective: Vital signs in last 24 hours: Temp:  [97.7 F (36.5 C)-98.1 F (36.7 C)] 97.7 F (36.5 C) (12/31 0700) Pulse Rate:  [62-72] 64 (12/31 0700) Resp:  [10-18] 14 (12/31 0700) BP: (95-117)/(60-66) 116/66 (12/31 0300) SpO2:  [96 %-98 %] 96 % (12/31 0300) Weight:  [65.7 kg] 65.7 kg (12/31 0300)  Hemodynamic parameters for last 24 hours:    Intake/Output from previous day: No intake/output data recorded. Intake/Output this shift: No intake/output data recorded.  General appearance: Chronically ill, more alert this AM Neurologic: intact Heart: regular rate and rhythm, S1, S2 normal, no murmur, click, rub or gallop Lungs: slightly diminished bibasilar breath sounds Abdomen: soft, non-tender; bowel sounds normal; no masses,  no organomegaly Extremities: unna boots in place Wound: Clean and dry without sign of infection  Lab Results: Recent Labs    03/04/24 0520 03/05/24 0559  WBC 4.9 4.7  HGB 10.4* 10.1*  HCT 31.9* 31.1*  PLT 162 153   BMET:  Recent Labs    03/04/24 0521 03/05/24 0559  NA 134* 132*  K 4.3 4.6  CL 93* 91*  CO2 27 22  GLUCOSE 141* 134*  BUN 52* 85*  CREATININE 2.43* 3.32*  CALCIUM  9.5 9.7    PT/INR: No results for input(s): LABPROT, INR in the last 72 hours. ABG    Component Value Date/Time   PHART 7.340 (L) 02/13/2024 1618   HCO3 18.0 (L) 02/13/2024 1618   TCO2 19 (L) 02/13/2024 1618   ACIDBASEDEF 7.0 (H) 02/13/2024 1618   O2SAT 64.4 03/05/2024 0525   CBG (last 3)  Recent Labs    03/05/24 0049 03/05/24 0406 03/05/24 0733  GLUCAP 117* 143* 120*    Assessment/Plan: S/P Procedures (LRB): ECMO CANNULATION (N/A)  HENT: Dry mouth, have discontinued  scopolamine  patch. Continue magic mouthwash prn and biotene. Overall stable.   Neuro: Flat affect and lethargic but improving. On Seroquel  and Sertraline  for depression.   CV: Postop afib, NSR HR 70s this AM, 1st degree AVB. On Amiodarone  200mg  BID since 12/24, will transition to PO Amiodarone  200mg  daily today. On Eliquis . Coox 64%. SBP mostly 100-120s. Dobutamine  weaned off per AHF team.    Pulm: Saturating well on RA. CXR with improved vascular congestion and pleural effusions. Encourage IS and ambulation.    GI: Severe malnutrition, tolerating nocturnal TF at 67mL/hr with cortrak. Albumin  3.8. Continue nocturnal tube feeds at reduced rate for now since patient does not want PEG and seems to be helping her PO intake. Will likely need a PEG at some point for nutrition if oral diet doesn't drastically improve. +BM 12/28.    Endo: CBGs controlled on Lantus  15U and SSI. Continue while on TFs   Renal: Cr 3.32. Oliguric on HD but patient reports she voided a few times yesterday, no UO recorded. Nephrology following. Plan to evaluate daily for signs of renal recovery with HD M,W,F as needed   ID: Completed cefepime  and vancomycin  due to respiratory culture with enterobacter. No leukocytosis, afebrile. On diflucan  for candidemia.   Expected postop ABLA and anemia of chronic disease: H/H 10.1/31.1, overall stable. Not clinically significant at this time.  Thrombocytopenia resolved.    DVT Prophylaxis: On Eliquis    Deconditioning: Severe deconditioning, CIR following. Her insurance declined CIR twice prior to her 2nd surgery so not sure if they will accept this time around but it would be the best option for her. If insurance does not approve CIR will need SNF. Continue to work with PT/OT.    Dispo: Plan to hopefully d/c to CIR end of the week if remains stable off dobutamine . Hopefully will have improved PO intake and see some renal recovery.    LOS: 63 days    [name] Bend,  PA-C 03/05/2024 Patient seen and examined, agree with findings and plan outlined above  Elspeth C. Kerrin, MD [facility] [phone]   "

## 2024-03-05 NOTE — Progress Notes (Addendum)
 CARDIAC REHAB PHASE I    Post OHS education including site care, restrictions, heart healthy diabetic diet, sternal precautions, IS use at home, home needs at discharge, and CRP2 reviewed. All questions and concerns addressed. Patient appeared quite drowsy throughout education process. Educational handouts left at bedside for further review. Will follow patient progression for any needs. Will be referred to [facility] for CRP2.   [phone] Isaiah JAYSON Liverpool, RN BSN 03/05/2024 11:26 AM

## 2024-03-06 LAB — COOXEMETRY PANEL
Carboxyhemoglobin: 2.2 % — ABNORMAL HIGH (ref 0.5–1.5)
Methemoglobin: <0.7 % (ref 0.0–1.5)
O2 Saturation: 64.6 %
Total hemoglobin: 11.2 g/dL — ABNORMAL LOW (ref 12.0–16.0)

## 2024-03-06 LAB — RENAL FUNCTION PANEL
Albumin: 4.1 g/dL (ref 3.5–5.0)
Anion gap: 15 (ref 5–15)
BUN: 59 mg/dL — ABNORMAL HIGH (ref 8–23)
CO2: 25 mmol/L (ref 22–32)
Calcium: 9.5 mg/dL (ref 8.9–10.3)
Chloride: 93 mmol/L — ABNORMAL LOW (ref 98–111)
Creatinine, Ser: 2.46 mg/dL — ABNORMAL HIGH (ref 0.44–1.00)
GFR, Estimated: 21 mL/min — ABNORMAL LOW
Glucose, Bld: 159 mg/dL — ABNORMAL HIGH (ref 70–99)
Phosphorus: 5.4 mg/dL — ABNORMAL HIGH (ref 2.5–4.6)
Potassium: 4.3 mmol/L (ref 3.5–5.1)
Sodium: 132 mmol/L — ABNORMAL LOW (ref 135–145)

## 2024-03-06 LAB — HEPATIC FUNCTION PANEL
ALT: 14 U/L (ref 0–44)
AST: 21 U/L (ref 15–41)
Albumin: 3.9 g/dL (ref 3.5–5.0)
Alkaline Phosphatase: 253 U/L — ABNORMAL HIGH (ref 38–126)
Bilirubin, Direct: 0.5 mg/dL — ABNORMAL HIGH (ref 0.0–0.2)
Indirect Bilirubin: 0.5 mg/dL (ref 0.3–0.9)
Total Bilirubin: 0.9 mg/dL (ref 0.0–1.2)
Total Protein: 6.8 g/dL (ref 6.5–8.1)

## 2024-03-06 LAB — GLUCOSE, CAPILLARY
Glucose-Capillary: 188 mg/dL — ABNORMAL HIGH (ref 70–99)
Glucose-Capillary: 119 mg/dL — ABNORMAL HIGH (ref 70–99)
Glucose-Capillary: 139 mg/dL — ABNORMAL HIGH (ref 70–99)
Glucose-Capillary: 114 mg/dL — ABNORMAL HIGH (ref 70–99)
Glucose-Capillary: 102 mg/dL — ABNORMAL HIGH (ref 70–99)

## 2024-03-06 LAB — MAGNESIUM: Magnesium: 2.2 mg/dL (ref 1.7–2.4)

## 2024-03-06 NOTE — Plan of Care (Signed)
  Problem: Education: Goal: Knowledge of General Education information will improve Description: Including pain rating scale, medication(s)/side effects and non-pharmacologic comfort measures Outcome: Progressing   Problem: Clinical Measurements: Goal: Will remain free from infection Outcome: Progressing   Problem: Nutrition: Goal: Adequate nutrition will be maintained Outcome: Progressing   Problem: Coping: Goal: Level of anxiety will decrease Outcome: Progressing   Problem: Safety: Goal: Ability to remain free from injury will improve Outcome: Progressing

## 2024-03-06 NOTE — Progress Notes (Signed)
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 TDC Following UOP and labs and intradialytic window for any suggestive GFR recovery, none to date Creatinine normal at baseline Currently using Left IJ TDC placed on 12/22 with IR.   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 On antifungal therapy  AHRF: Resolved on room air  Anemia -transfuse PRN for Hgb <7; Hb 10s  Hyponatremia: mild and intermittent.  Manage volume with dialysis  Hyperkalemia: Stable/resolved   Subjective:   No interval events. HD yesterday with 2.5L UF 0.2L UOP    Objective:   BP 109/66 (BP Location: Left Arm)   Pulse 71   Temp 98 F (36.7 C) (Axillary)   Resp 19   Ht 5' 4 (1.626 m)   Wt 63.9 kg   SpO2 96%   BMI 24.18 kg/m   Intake/Output Summary (Last 24 hours) at 03/06/2024 0914 Last data filed at 03/06/2024 0039 Gross per 24 hour  Intake 150 ml  Output 2700 ml  Net -2550 ml    Weight change: -1.5 kg  Physical Exam: Gen: ill/tired appearing, NAD, lying flat in bed CVS: normal rate, no rub Resp: bl chest rise, normal wob Abd: soft Ext: No significant edema noted Dialysis access:IJ tunneled  HD cath  Imaging: No results found.      Labs: BMET Recent Labs  Lab 02/29/24 0458 03/01/24 0445 03/02/24 0440 03/03/24 0450 03/04/24 0521 03/05/24 0559 03/06/24 0327  NA 132* 133* 133* 133* 134* 132* 132*  K 5.6* 4.8 4.2 5.2* 4.3 4.6 4.3  CL 93* 93* 92* 93* 93* 91* 93*  CO2 25 27 28 25 27 22 25   GLUCOSE 148* 148* 160* 179* 141* 134* 159*  BUN 88* 58* 44* 80* 52* 85* 59*  CREATININE 3.29* 2.60* 2.25* 3.24* 2.43* 3.32* 2.46*  CALCIUM  9.3 9.1 9.3 9.3 9.5 9.7 9.5  PHOS 5.3* 4.9* 4.2 5.1* 4.6 6.1* 5.4*   CBC Recent Labs  Lab 03/02/24 0440 03/03/24 0450 03/04/24 0520  03/05/24 0559  WBC 4.4 4.3 4.9 4.7  HGB 10.1* 9.6* 10.4* 10.1*  HCT 31.6* 29.7* 31.9* 31.1*  MCV 99.1 98.7 98.2 98.7  PLT 147* 136* 162 153    Medications:     amiodarone   200 mg Oral Daily   apixaban   5 mg Oral BID   clonazePAM   0.5 mg Oral QHS   feeding supplement  237 mL Oral TID BM   feeding supplement (PROSource TF20)  60 mL Per Tube BID   feeding supplement (VITAL 1.5 CAL)  1,000 mL Per Tube Q24H   Gerhardt's butt cream   Topical BID   insulin  aspart  0-9 Units Subcutaneous Q4H   insulin  glargine  15 Units Subcutaneous Daily   megestrol  40 mg Oral BID   mirtazapine   30 mg Oral QHS   mouth rinse  15 mL Mouth Rinse 4 times per day   pantoprazole   40 mg Oral Daily   QUEtiapine   25 mg Oral QHS   sertraline   25 mg Oral QHS   sodium chloride  flush  10-40 mL Intracatheter Q12H       "

## 2024-03-06 NOTE — Progress Notes (Signed)
 Mobility Specialist Progress Note;   03/06/24 0938  Mobility  Activity Stood at bedside (x3)  Level of Assistance Minimal assist, patient does 75% or more  Assistive Device Front wheel walker  Distance Ambulated (ft)  (pt deferred d/t fatigue)  RUE Weight Bearing Per Provider Order NWB  LUE Weight Bearing Per Provider Order NWB  Activity Response Tolerated well  Mobility Referral Yes  Mobility visit 1 Mobility  Mobility Specialist Start Time (ACUTE ONLY) [id number]  Mobility Specialist Stop Time (ACUTE ONLY) 0950  Mobility Specialist Time Calculation (min) (ACUTE ONLY) 12 min   Pt agreeable to mobility w/ encouragement. Required MinA for bed mobility to assist w/ trunk elevation and to adhere to sternal precautions. Pt able to perform 3x STS w/ MinA and standing marches each trial. Pt deferred further mobility d/t BLE fatigue. HR maintained throughout activity. Pt returned to supine and left with all needs met. RN present.   Lauraine Erm Mobility Specialist Please contact via SecureChat or [employer] [phone]

## 2024-03-06 NOTE — Progress Notes (Addendum)
" ° °   °  [address] [place] [address]             [phone]         26 Days Post-Op Procedures (LRB): ECMO CANNULATION (N/A)  Subjective:  Patient not feeling as good today.  No specific complaints  Objective: Vital signs in last 24 hours: Temp:  [97.8 F (36.6 C)-98.3 F (36.8 C)] 98 F (36.7 C) (01/01 0700) Pulse Rate:  [66-84] 71 (01/01 0500) Cardiac Rhythm: Normal sinus rhythm;Heart block;Bundle branch block (01/01 0717) Resp:  [12-20] 19 (01/01 0700) BP: (90-118)/(51-77) 109/66 (01/01 0700) SpO2:  [94 %-100 %] 96 % (01/01 0700) Weight:  [63.9 kg-64.2 kg] 63.9 kg (01/01 0629)  Intake/Output from previous day: 12/31 0701 - 01/01 0700 In: 150 [P.O.:120; NG/GT:30] Out: 2700 [Urine:200]   General appearance: alert, cooperative, and no distress Heart: regular rate and rhythm Lungs: clear to auscultation bilaterally Abdomen: soft, non-tender; bowel sounds normal; no masses,  no organomegaly Extremities: edema trace Wound: clean and dry  Lab Results: Recent Labs    03/04/24 0520 03/05/24 0559  WBC 4.9 4.7  HGB 10.4* 10.1*  HCT 31.9* 31.1*  PLT 162 153   BMET:  Recent Labs    03/05/24 0559 03/06/24 0327  NA 132* 132*  K 4.6 4.3  CL 91* 93*  CO2 22 25  GLUCOSE 134* 159*  BUN 85* 59*  CREATININE 3.32* 2.46*  CALCIUM  9.7 9.5    PT/INR: No results for input(s): LABPROT, INR in the last 72 hours. ABG    Component Value Date/Time   PHART 7.340 (L) 02/13/2024 1618   HCO3 18.0 (L) 02/13/2024 1618   TCO2 19 (L) 02/13/2024 1618   ACIDBASEDEF 7.0 (H) 02/13/2024 1618   O2SAT 64.6 03/06/2024 0327   CBG (last 3)  Recent Labs    03/05/24 2357 03/06/24 0446 03/06/24 0722  GLUCAP 143* 188* 119*    Assessment/Plan: S/P Procedures (LRB): ECMO CANNULATION (N/A)  CV- PAF, has been maintaining NSR with 1st degree Block- amiodarone  reduced to 200 mg daily, continue Eliquis .SABRA Co-ox remains stable, AHF following HEENT- dry  mouth, chronic.. continue supportive care Renal- Creatinine improving, down to 2.46 today, continue to monitor for urine production.. HD per Nephrology.. hopefully her kidneys can recover GI- cortrak remains in place, tube feedings now nightly only.. oral intake is improving as able.. monitor caloric intake, Megace started to help with appetite ID- Fungal Endocarditis-continue diflucan  per ID recommendations..  Stage 3 Pressure Injury Right Buttocks- wound care per recommendations by wound care Deconditioning- severe, patient working with PT/OT would greatly benefit from advanced rehab therapies provided by CIR.SABRA insurance previously denied prior to her second surgery.SABRA authorization has been started..   LOS: 64 days    Erin Barrett, PA-C 03/06/2024 8:39 AM  No events Creat trending down Dispo planning  Masao Junker O Maripaz Mullan   "

## 2024-03-06 NOTE — Plan of Care (Signed)
  Problem: Education: Goal: Knowledge of General Education information will improve Description: Including pain rating scale, medication(s)/side effects and non-pharmacologic comfort measures Outcome: Progressing   Problem: Activity: Goal: Risk for activity intolerance will decrease Outcome: Progressing   Problem: Nutrition: Goal: Adequate nutrition will be maintained Outcome: Progressing   Problem: Elimination: Goal: Will not experience complications related to urinary retention Outcome: Progressing   Problem: Skin Integrity: Goal: Risk for impaired skin integrity will decrease Outcome: Progressing

## 2024-03-07 ENCOUNTER — Telehealth (HOSPITAL_COMMUNITY): Payer: Self-pay

## 2024-03-07 ENCOUNTER — Other Ambulatory Visit (HOSPITAL_COMMUNITY): Payer: Self-pay

## 2024-03-07 LAB — CBC WITH DIFFERENTIAL/PLATELET
Abs Immature Granulocytes: 0.05 K/uL (ref 0.00–0.07)
Basophils Absolute: 0.1 K/uL (ref 0.0–0.1)
Basophils Relative: 1 %
Eosinophils Absolute: 0.2 K/uL (ref 0.0–0.5)
Eosinophils Relative: 3 %
HCT: 32.1 % — ABNORMAL LOW (ref 36.0–46.0)
Hemoglobin: 10.9 g/dL — ABNORMAL LOW (ref 12.0–15.0)
Immature Granulocytes: 1 %
Lymphocytes Relative: 20 %
Lymphs Abs: 1.2 K/uL (ref 0.7–4.0)
MCH: 32.4 pg (ref 26.0–34.0)
MCHC: 34 g/dL (ref 30.0–36.0)
MCV: 95.5 fL (ref 80.0–100.0)
Monocytes Absolute: 0.7 K/uL (ref 0.1–1.0)
Monocytes Relative: 12 %
Neutro Abs: 3.8 K/uL (ref 1.7–7.7)
Neutrophils Relative %: 63 %
Platelets: 155 K/uL (ref 150–400)
RBC: 3.36 MIL/uL — ABNORMAL LOW (ref 3.87–5.11)
RDW: 18.1 % — ABNORMAL HIGH (ref 11.5–15.5)
WBC: 6 K/uL (ref 4.0–10.5)
nRBC: 0 % (ref 0.0–0.2)

## 2024-03-07 LAB — HEPATIC FUNCTION PANEL
ALT: 12 U/L (ref 0–44)
AST: 17 U/L (ref 15–41)
Albumin: 3.9 g/dL (ref 3.5–5.0)
Alkaline Phosphatase: 241 U/L — ABNORMAL HIGH (ref 38–126)
Bilirubin, Direct: 0.4 mg/dL — ABNORMAL HIGH (ref 0.0–0.2)
Indirect Bilirubin: 0.3 mg/dL (ref 0.3–0.9)
Total Bilirubin: 0.8 mg/dL (ref 0.0–1.2)
Total Protein: 6.6 g/dL (ref 6.5–8.1)

## 2024-03-07 LAB — GLUCOSE, CAPILLARY
Glucose-Capillary: 107 mg/dL — ABNORMAL HIGH (ref 70–99)
Glucose-Capillary: 115 mg/dL — ABNORMAL HIGH (ref 70–99)
Glucose-Capillary: 138 mg/dL — ABNORMAL HIGH (ref 70–99)
Glucose-Capillary: 146 mg/dL — ABNORMAL HIGH (ref 70–99)
Glucose-Capillary: 149 mg/dL — ABNORMAL HIGH (ref 70–99)
Glucose-Capillary: 91 mg/dL (ref 70–99)

## 2024-03-07 LAB — COOXEMETRY PANEL
Carboxyhemoglobin: 2.1 % — ABNORMAL HIGH (ref 0.5–1.5)
Methemoglobin: <0.7 % (ref 0.0–1.5)
O2 Saturation: 62 %
Total hemoglobin: 11.1 g/dL — ABNORMAL LOW (ref 12.0–16.0)

## 2024-03-07 LAB — RENAL FUNCTION PANEL
Albumin: 3.9 g/dL (ref 3.5–5.0)
Anion gap: 18 — ABNORMAL HIGH (ref 5–15)
BUN: 102 mg/dL — ABNORMAL HIGH (ref 8–23)
CO2: 21 mmol/L — ABNORMAL LOW (ref 22–32)
Calcium: 9.8 mg/dL (ref 8.9–10.3)
Chloride: 92 mmol/L — ABNORMAL LOW (ref 98–111)
Creatinine, Ser: 3.27 mg/dL — ABNORMAL HIGH (ref 0.44–1.00)
GFR, Estimated: 15 mL/min — ABNORMAL LOW
Glucose, Bld: 147 mg/dL — ABNORMAL HIGH (ref 70–99)
Phosphorus: 6.6 mg/dL — ABNORMAL HIGH (ref 2.5–4.6)
Potassium: 4.7 mmol/L (ref 3.5–5.1)
Sodium: 131 mmol/L — ABNORMAL LOW (ref 135–145)

## 2024-03-07 LAB — MAGNESIUM: Magnesium: 2.4 mg/dL (ref 1.7–2.4)

## 2024-03-07 MED ORDER — HYDROCERIN EX CREA
TOPICAL_CREAM | Freq: Two times a day (BID) | CUTANEOUS | Status: DC
  Administered 2024-03-07: 1 via TOPICAL
  Filled 2024-03-07: qty 113

## 2024-03-07 MED ORDER — HEPARIN SODIUM (PORCINE) 1000 UNIT/ML IJ SOLN
INTRAMUSCULAR | Status: AC
  Filled 2024-03-07: qty 5

## 2024-03-07 NOTE — Progress Notes (Signed)
" °   03/07/24 1343  Vitals  Temp 97.8 F (36.6 C)  Pulse Rate 89  Resp 14  BP 115/70  SpO2 100 %  O2 Device Room Air  Weight (S)  61.6 kg (Bed Scale)  Type of Weight Post-Dialysis  Oxygen Therapy  Patient Activity (if Appropriate) In bed  Pulse Oximetry Type Continuous   Received patient in bed to unit.  Alert and oriented.  Informed consent signed and in chart.   TX duration: 3.5  Patient tolerated well.  Transported back to the room  Alert, without acute distress.  Hand-off given to patient's nurse.   Access used: Yes Access issues: No  Total UF removed: 2500 Medication(s) given: See MAR Post HD VS: See Above Grid Post HD weight: 61.6 kg   [name] "

## 2024-03-07 NOTE — Progress Notes (Signed)
" °   03/07/24 1343  Vitals  Temp 97.8 F (36.6 C)  Pulse Rate 89  Resp 14  BP 115/70  SpO2 100 %  O2 Device Room Air  Weight (S)  61.6 kg (Bed Scale)  Type of Weight Post-Dialysis  Oxygen Therapy  Patient Activity (if Appropriate) In bed  Pulse Oximetry Type Continuous  Post Treatment  Dialyzer Clearance Clear  Hemodialysis Intake (mL) 0 mL  Liters Processed 74.5  Fluid Removed (mL) 2500 mL  Tolerated HD Treatment Yes  Post-Hemodialysis Comments Pt. tolerated treatment without difficulties. UF goal maintained per. order, admin medication per order. Report call to [facility] bedside RN.    "

## 2024-03-07 NOTE — Consult Note (Addendum)
 WOC Nurse Consult Note: Reason for Consult: L heel  Wound type: thick hyperkeratotic skin, no open wound noted  Pressure Injury POA: NA  Measurement: no wound  Wound bed: thick hyperkeratotic skin as above  Drainage (amount, consistency, odor) dry  Periwound: mild erythema and scattered areas of dry skin  Dressing procedure/placement/frequency: Patient is wearing unna boots changed on Mondays and Thursday;  Will add to place a piece of Xeroform to L heel prior to placement of unna boots these days  Cleanse L heel with soap and water  after removal of unna boot. Apply a piece of Xeroform gauze ([employer] #[id number]) to L heel prior to placement of new unna boot.  Perform Monday and Thursday with change of unna boot.     WOC team is currently following patient for other pressure injuries.  Will not follow heel at this time, reconsult if further needs arise.   Thank you,    Powell Bar MSN, RN-BC, [employer]

## 2024-03-07 NOTE — Telephone Encounter (Signed)
 Pharmacy Patient Advocate Encounter  Insurance verification completed.    The patient is insured through [facility]. Patient has Medicare and is not eligible for a copay card, but may be able to apply for patient assistance or Medicare RX Payment Plan (Patient Must reach out to their plan, if eligible for payment plan), if available.    Ran test claim for Eliquis  5mg  tablet and the current 30 day co-pay is $249.45.   This test claim was processed through [facility]- copay amounts may vary at other pharmacies due to pharmacy/plan contracts, or as the patient moves through the different stages of their insurance plan.

## 2024-03-07 NOTE — Progress Notes (Addendum)
 Patient stated she was hungry when she got back on unit from dialysis, NT helped patient order food, patient did not want the food when it was delivered to room as I, the RN asked patient if she would like the tray across her and reposition so she can eat, patient stated no its ok. Patients' family bought patient soup from [employer] and patient stated she wasn't ready for it at the time they gave it to her while stating that we need to make sure patient gets food that she will enjoy eating, explained to family that patient orders her own trays when they come around and that if patient declines ordering food that they will deliver a daily tray.  . After patient's family left, patient stated for RN to put the untouched soup back into the cooler provided by family. Patients oral intake for the shift was 0 as patient refused scheduled ensures as well.

## 2024-03-07 NOTE — Progress Notes (Addendum)
 "     [address] [place] [address]             [phone]      27 Days Post-Op Procedures (LRB): ECMO CANNULATION (N/A) Subjective: Patient reports she is tired. Reports her kids brought her a bunch of food yesterday and she ate most of it.   Objective: Vital signs in last 24 hours: Temp:  [98 F (36.7 C)-98.2 F (36.8 C)] 98.2 F (36.8 C) (01/02 0732) Pulse Rate:  [68-71] 68 (01/02 0732) Cardiac Rhythm: Normal sinus rhythm;Bundle branch block;Heart block (01/01 1900) Resp:  [10-17] 17 (01/02 0732) BP: (108-126)/(65-75) 121/67 (01/02 0732) SpO2:  [95 %-97 %] 97 % (01/02 0732) Weight:  [62.6 kg] 62.6 kg (01/02 0431)  Hemodynamic parameters for last 24 hours:    Intake/Output from previous day: 01/01 0701 - 01/02 0700 In: 90 [NG/GT:90] Out: 200 [Urine:200] Intake/Output this shift: No intake/output data recorded.  General appearance: ill appearing, more alert and cooperative Neurologic: intact Heart: regular rate and rhythm, S1, S2 normal, no murmur, click, rub or gallop Lungs: clear to auscultation bilaterally Abdomen: soft, non-tender; bowel sounds normal; no masses,  no organomegaly Extremities: unna boots in place Wound: Clean and dry without sign of infection  Lab Results: Recent Labs    03/05/24 0559  WBC 4.7  HGB 10.1*  HCT 31.1*  PLT 153   BMET:  Recent Labs    03/06/24 0327 03/07/24 0508  NA 132* 131*  K 4.3 4.7  CL 93* 92*  CO2 25 21*  GLUCOSE 159* 147*  BUN 59* 102*  CREATININE 2.46* 3.27*  CALCIUM  9.5 9.8    PT/INR: No results for input(s): LABPROT, INR in the last 72 hours. ABG    Component Value Date/Time   PHART 7.340 (L) 02/13/2024 1618   HCO3 18.0 (L) 02/13/2024 1618   TCO2 19 (L) 02/13/2024 1618   ACIDBASEDEF 7.0 (H) 02/13/2024 1618   O2SAT 62 03/07/2024 0503   CBG (last 3)  Recent Labs    03/06/24 2039 03/07/24 0000 03/07/24 0428  GLUCAP 102* 138* 149*    Assessment/Plan: S/P  Procedures (LRB): ECMO CANNULATION (N/A)  HENT: Dry mouth, have discontinued scopolamine  patch. Continue magic mouthwash prn and biotene. Overall stable.    Neuro: Flat affect and lethargic but improving, more talkative and alert this AM. On Seroquel  and Sertraline  for depression.   CV: Postop afib, NSR HR 60s this AM, long 1st degree AVB. On Amiodarone  200mg  daily. On Eliquis . SBP mostly 110s-120s. AHF team has signed off.    Pulm: Saturating well on RA. Last CXR with improved vascular congestion and pleural effusions. Encourage IS and ambulation.    GI: Severe malnutrition, tolerating nocturnal TF at 59mL/hr with cortrak. Albumin  3.8. Continue nocturnal tube feeds at reduced rate for now since patient does not want PEG and seems to be helping her PO intake. Will likely need a PEG at some point for nutrition if oral diet doesn't drastically improve. +BM 2 days ago.   Endo: CBGs controlled on Lantus  15U and SSI. Continue while on TFs   Renal: Cr 3.27. Oliguric on HD, UO 200cc/24hrs recorded. Nephrology following. Plan to evaluate daily for signs of renal recovery with HD M,W,F as needed. May try diuretics on non HD days per AHF team.    ID: Completed cefepime  and vancomycin  due to respiratory culture with enterobacter. No leukocytosis, afebrile. On diflucan  for candidemia. Will discuss with surgeon timeline for  Diflucan .    Expected postop ABLA and anemia of chronic disease: H/H 10.1/31.1, overall stable. Not clinically significant at this time. Thrombocytopenia resolved.    DVT Prophylaxis: On Eliquis    Deconditioning: Severe deconditioning, CIR following. Her insurance declined CIR twice prior to her 2nd surgery so not sure if they will accept this time around but it would be the best option for her. If insurance does not approve CIR will need SNF. Continue to work with PT/OT.    Dispo: Insurance authorization for [employer] pending. PO intake seems to be slowly improving.      LOS: 65 days     [name] Bend, PA-C 03/07/2024  Patient seen and examined, agree with findings and plan as outlined above  Elspeth C. Kerrin, MD [facility] [phone]   "

## 2024-03-07 NOTE — Progress Notes (Addendum)
 Nutrition Follow-up  DOCUMENTATION CODES:  Severe malnutrition in context of acute illness/injury  INTERVENTION:  Continue Ensure Plus High Protein po TID, each supplement provides 350 kcal and 20 grams of protein Encouraged intake of meals and supplements Continue nocturnal TF via Cortrak: Vital 1.5 at 35 ml/h x 12 hours (6pm-6am) to provide 630 kcal and 28 gm protein daily.  NUTRITION DIAGNOSIS:  Severe Malnutrition related to acute illness as evidenced by moderate muscle depletion, mild muscle depletion, energy intake < or equal to 50% for > or equal to 5 days; ongoing.  GOAL:  Patient will meet greater than or equal to 90% of their needs;   MONITOR:  Vent status, TF tolerance, Labs, Weight trends  REASON FOR ASSESSMENT:  Consult Other (Comment) (new ECMO)  ASSESSMENT:  69 yo female admitted with hx of Calbicans candidemia, being treated at [facility] in [place] but left AMA and presents with MV endocarditis requiring MVR, post-op course complicated by shock post-op, junctional bradycardia, afib with RVR and pneumonia. PMH includes nephrolithiasis s/p ureteral stent and lithotripsy  Remains on a regular diet. Meal intakes 0-50% (03/03/11/31). Patient states her appetite has improved. She ordered breakfast today and was looking forward to eating, but then had to leave for HD. She is now waiting on her early dinner tray. She drinks some of the Ensure supplements, but doesn't really like it. She tried and dislikes the magic cups. She agreed to work on eating more of her meals and drinking her supplements. He children brought her some food yesterday and she ate all of it.   Cortrak in place; receiving Vital 1.5 at 35 ml/h from 6pm-6am. This provides 630 kcal and 28 gm protein. This meets 33% of minimum kcal needs and 28% of minimum protein needs.   Labs reviewed.  Na 131 Phos 6.6 CBG: [id number]  Medications reviewed and include novolog , lantus , megace, remeron ,  protonix .  Diet Order:   Diet Order             Diet regular Room service appropriate? Yes; Fluid consistency: Thin  Diet effective now                   EDUCATION NEEDS:  Education needs have been addressed  Skin:  Skin Assessment: Skin Integrity Issues: Skin Integrity Issues:: Stage III DTI: - Stage III: R buttocks Incisions: chest (closed)  Last BM:  12/28 x 2  Height:  Ht Readings from Last 1 Encounters:  02/15/24 5' 4 (1.626 m)   Weight:  Wt Readings from Last 1 Encounters:  03/07/24 (S) 61.6 kg   Ideal Body Weight:  54.5 kg  BMI:  Body mass index is 23.31 kg/m.  Estimated Nutritional Needs:  Kcal:  1900-2100 kcals Protein:  100-115g Fluid:  1.5 L   Suzen HUNT RD, LDN, CNSC Contact via secure chat. If unavailable, use group chat RD Inpatient.

## 2024-03-07 NOTE — Progress Notes (Signed)
" °  Inpatient Rehabilitation Admissions Coordinator   I await insurance approval for possible Cir admit.  Heron Leavell, RN, MSN Rehab Admissions Coordinator [phone] 03/07/2024 8:41 AM  "

## 2024-03-07 NOTE — Plan of Care (Signed)
?  Problem: Education: ?Goal: Knowledge of General Education information will improve ?Description: Including pain rating scale, medication(s)/side effects and non-pharmacologic comfort measures ?Outcome: Progressing ?  ?Problem: Clinical Measurements: ?Goal: Ability to maintain clinical measurements within normal limits will improve ?Outcome: Progressing ?Goal: Respiratory complications will improve ?Outcome: Progressing ?  ?Problem: Activity: ?Goal: Risk for activity intolerance will decrease ?Outcome: Progressing ?  ?Problem: Nutrition: ?Goal: Adequate nutrition will be maintained ?Outcome: Progressing ?  ?Problem: Skin Integrity: ?Goal: Risk for impaired skin integrity will decrease ?Outcome: Progressing ?  ?

## 2024-03-07 NOTE — Progress Notes (Signed)
" °  [facility] Progress Note    Assessment/ Plan:   Dialysis dependent AKI, initiated 02/16/2024 Currently on MWF schedule: 3.5h, 2-3L UF, no heparin . TDC. HD today - seen at tx, doing fine Following UOP and labs and intradialytic window for any suggestive GFR recovery, none to date Creatinine normal at baseline Currently using Left IJ TDC placed on 12/22 with IR.   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 On antifungal therapy  Anemia -transfuse PRN for Hgb <7; Hb 10s  Hyponatremia: mild and intermittent.  Manage volume with dialysis, na/fluid restriction  Will follow.    Subjective:   No interval events. At HD today 0.2L UOP    Objective:   BP 121/67 (BP Location: Left Arm)   Pulse 68   Temp 98.2 F (36.8 C) (Oral)   Resp 17   Ht 5' 4 (1.626 m)   Wt 62.6 kg   SpO2 97%   BMI 23.69 kg/m   Intake/Output Summary (Last 24 hours) at 03/07/2024 0801 Last data filed at 03/07/2024 0606 Gross per 24 hour  Intake 60 ml  Output 200 ml  Net -140 ml    Weight change: -1.6 kg  Physical Exam: Gen: ill/tired appearing, NAD, lying flat in bed  CVS: normal rate, no rub Resp: bl chest rise, normal wob Abd: soft Ext: No significant edema noted Dialysis access:IJ tunneled  HD cath  Imaging: No results found.      Labs: BMET Recent Labs  Lab 03/01/24 0445 03/02/24 0440 03/03/24 0450 03/04/24 0521 03/05/24 0559 03/06/24 0327 03/07/24 0508  NA 133* 133* 133* 134* 132* 132* 131*  K 4.8 4.2 5.2* 4.3 4.6 4.3 4.7  CL 93* 92* 93* 93* 91* 93* 92*  CO2 27 28 25 27 22 25  21*  GLUCOSE 148* 160* 179* 141* 134* 159* 147*  BUN 58* 44* 80* 52* 85* 59* 102*  CREATININE 2.60* 2.25* 3.24* 2.43* 3.32* 2.46* 3.27*  CALCIUM  9.1 9.3 9.3 9.5 9.7 9.5 9.8  PHOS 4.9* 4.2 5.1* 4.6 6.1* 5.4* 6.6*   CBC Recent Labs  Lab 03/02/24 0440 03/03/24 0450 03/04/24 0520 03/05/24 0559   WBC 4.4 4.3 4.9 4.7  HGB 10.1* 9.6* 10.4* 10.1*  HCT 31.6* 29.7* 31.9* 31.1*  MCV 99.1 98.7 98.2 98.7  PLT 147* 136* 162 153    Medications:     amiodarone   200 mg Oral Daily   apixaban   5 mg Oral BID   clonazePAM   0.5 mg Oral QHS   feeding supplement  237 mL Oral TID BM   feeding supplement (PROSource TF20)  60 mL Per Tube BID   feeding supplement (VITAL 1.5 CAL)  1,000 mL Per Tube Q24H   Gerhardt's butt cream   Topical BID   insulin  aspart  0-9 Units Subcutaneous Q4H   insulin  glargine  15 Units Subcutaneous Daily   megestrol  40 mg Oral BID   mirtazapine   30 mg Oral QHS   mouth rinse  15 mL Mouth Rinse 4 times per day   pantoprazole   40 mg Oral Daily   QUEtiapine   25 mg Oral QHS   sertraline   25 mg Oral QHS   sodium chloride  flush  10-40 mL Intracatheter Q12H       "

## 2024-03-07 NOTE — Plan of Care (Signed)
  Problem: Clinical Measurements: Goal: Will remain free from infection Outcome: Progressing Goal: Respiratory complications will improve Outcome: Progressing   Problem: Nutrition: Goal: Adequate nutrition will be maintained Outcome: Not Progressing

## 2024-03-08 LAB — GLUCOSE, CAPILLARY
Glucose-Capillary: 168 mg/dL — ABNORMAL HIGH (ref 70–99)
Glucose-Capillary: 103 mg/dL — ABNORMAL HIGH (ref 70–99)
Glucose-Capillary: 143 mg/dL — ABNORMAL HIGH (ref 70–99)
Glucose-Capillary: 129 mg/dL — ABNORMAL HIGH (ref 70–99)
Glucose-Capillary: 120 mg/dL — ABNORMAL HIGH (ref 70–99)

## 2024-03-08 LAB — RENAL FUNCTION PANEL
Albumin: 4 g/dL (ref 3.5–5.0)
Anion gap: 16 — ABNORMAL HIGH (ref 5–15)
BUN: 71 mg/dL — ABNORMAL HIGH (ref 8–23)
CO2: 25 mmol/L (ref 22–32)
Calcium: 9.6 mg/dL (ref 8.9–10.3)
Chloride: 91 mmol/L — ABNORMAL LOW (ref 98–111)
Creatinine, Ser: 2.8 mg/dL — ABNORMAL HIGH (ref 0.44–1.00)
GFR, Estimated: 18 mL/min — ABNORMAL LOW
Glucose, Bld: 185 mg/dL — ABNORMAL HIGH (ref 70–99)
Phosphorus: 5.7 mg/dL — ABNORMAL HIGH (ref 2.5–4.6)
Potassium: 4.1 mmol/L (ref 3.5–5.1)
Sodium: 132 mmol/L — ABNORMAL LOW (ref 135–145)

## 2024-03-08 LAB — MAGNESIUM: Magnesium: 2.3 mg/dL (ref 1.7–2.4)

## 2024-03-08 NOTE — Progress Notes (Signed)
" °   [facility] Note    Assessment/ Plan:   Dialysis dependent AKI, initiated 02/16/2024: Creatinine normal at baseline Currently on MWF schedule: 3.5h, 2-3L UF, no heparin . TDC. HD yest UF 2.5L Following UOP and labs and intradialytic window for any suggestive GFR recovery, none to date Currently using Left IJ TDC placed on 12/22 with IR.   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 On antifungal therapy  Anemia -transfuse PRN for Hgb <7; Hb 10s  Hyponatremia: mild and intermittent.  Manage volume with dialysis, na/fluid restriction  Will follow.    Subjective:   No interval events. 0.15L UOP yest, 2.5L with HD Says dialysis was tiring but tolerated it without cramps, dizzines    Objective:   BP 108/67 (BP Location: Left Arm)   Pulse 73   Temp 98.3 F (36.8 C) (Oral)   Resp 16   Ht 5' 4 (1.626 m)   Wt 60.7 kg   SpO2 96%   BMI 22.97 kg/m   Intake/Output Summary (Last 24 hours) at 03/08/2024 9082 Last data filed at 03/07/2024 1500 Gross per 24 hour  Intake 120 ml  Output 2650 ml  Net -2530 ml    Weight change: 1.4 kg  Physical Exam: Gen: tired appearing, NAD, lying flat in bed  CVS: normal rate, no rub Resp: bl chest rise, normal wob Abd: soft Ext: No significant edema noted Dialysis access:IJ tunneled  HD cath  Imaging: No results found.      Labs: BMET Recent Labs  Lab 03/02/24 0440 03/03/24 0450 03/04/24 0521 03/05/24 0559 03/06/24 0327 03/07/24 0508 03/08/24 0500  NA 133* 133* 134* 132* 132* 131* 132*  K 4.2 5.2* 4.3 4.6 4.3 4.7 4.1  CL 92* 93* 93* 91* 93* 92* 91*  CO2 28 25 27 22 25  21* 25  GLUCOSE 160* 179* 141* 134* 159* 147* 185*  BUN 44* 80* 52* 85* 59* 102* 71*  CREATININE 2.25* 3.24* 2.43* 3.32* 2.46* 3.27* 2.80*  CALCIUM  9.3 9.3 9.5 9.7 9.5 9.8 9.6  PHOS 4.2 5.1* 4.6 6.1* 5.4* 6.6* 5.7*   CBC Recent Labs  Lab  03/03/24 0450 03/04/24 0520 03/05/24 0559 03/07/24 0730  WBC 4.3 4.9 4.7 6.0  NEUTROABS  --   --   --  3.8  HGB 9.6* 10.4* 10.1* 10.9*  HCT 29.7* 31.9* 31.1* 32.1*  MCV 98.7 98.2 98.7 95.5  PLT 136* 162 153 155    Medications:     amiodarone   200 mg Oral Daily   apixaban   5 mg Oral BID   clonazePAM   0.5 mg Oral QHS   feeding supplement  237 mL Oral TID BM   feeding supplement (PROSource TF20)  60 mL Per Tube BID   feeding supplement (VITAL 1.5 CAL)  1,000 mL Per Tube Q24H   Gerhardt's butt cream   Topical BID   insulin  aspart  0-9 Units Subcutaneous Q4H   insulin  glargine  15 Units Subcutaneous Daily   megestrol   40 mg Oral BID   mirtazapine   30 mg Oral QHS   mouth rinse  15 mL Mouth Rinse 4 times per day   pantoprazole   40 mg Oral Daily   QUEtiapine   25 mg Oral QHS   sertraline   25 mg Oral QHS   sodium chloride  flush  10-40 mL Intracatheter Q12H       "

## 2024-03-08 NOTE — Progress Notes (Signed)
 "     [address] [place] [address]             [phone]      28 Days Post-Op Procedures (LRB): ECMO CANNULATION (N/A) Subjective: Resting in bed, said the dialysis made her tired yesterday. Appetite still poor.  No new complaints.   Objective: Vital signs in last 24 hours: Temp:  [97.6 F (36.4 C)-98.3 F (36.8 C)] 98.3 F (36.8 C) (01/03 0807) Pulse Rate:  [73-95] 73 (01/03 0405) Cardiac Rhythm: Normal sinus rhythm (01/03 0700) Resp:  [12-19] 16 (01/03 0405) BP: (97-120)/(59-74) 108/67 (01/03 0807) SpO2:  [94 %-100 %] 96 % (01/03 0807) Weight:  [60.7 kg-61.6 kg] 60.7 kg (01/03 0446)     Intake/Output from previous day: 01/02 0701 - 01/03 0700 In: 120 [P.O.:120] Out: 2680 [Urine:150; Emesis/NG output:30] Intake/Output this shift: No intake/output data recorded.  General appearance: alert and cooperative, no distress Neurologic: intact Heart: regular rate and rhythm Lungs: clear to auscultation bilaterally Abdomen: soft, non-tender; bowel sounds normal Extremities: no edema Wound: the sternotomy incision is clean and dry without sign of infection  Lab Results: Recent Labs    03/07/24 0730  WBC 6.0  HGB 10.9*  HCT 32.1*  PLT 155   BMET:  Recent Labs    03/07/24 0508 03/08/24 0500  NA 131* 132*  K 4.7 4.1  CL 92* 91*  CO2 21* 25  GLUCOSE 147* 185*  BUN 102* 71*  CREATININE 3.27* 2.80*  CALCIUM  9.8 9.6    PT/INR: No results for input(s): LABPROT, INR in the last 72 hours. ABG    Component Value Date/Time   PHART 7.340 (L) 02/13/2024 1618   HCO3 18.0 (L) 02/13/2024 1618   TCO2 19 (L) 02/13/2024 1618   ACIDBASEDEF 7.0 (H) 02/13/2024 1618   O2SAT 62 03/07/2024 0503   CBG (last 3)  Recent Labs    03/07/24 2341 03/08/24 0405 03/08/24 0806  GLUCAP 146* 168* 103*    Assessment/Plan: S/P Procedures (LRB): ECMO CANNULATION (N/A)    Neuro: On Seroquel  and Sertraline  for depression.   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 Continue Amiodarone  200mg  daily and Eliquis . SBP mostly 110s-120s. AHF team has signed off.    Pulm: Saturating well on RA. Last CXR with improved vascular congestion and pleural effusions. Encourage IS and ambulation.    GI: Severe malnutrition and poor appetite, tolerating nocturnal TF at 89mL/hr with cortrak. Albumin  3.8. continue Megace    Endo: CBGs control reasonable on Lantus  15U and SSI. Continue while on TFs   Renal: ARF,  Oliguric on HD, UO 150cc/24hrs recorded. Nephrology following. Plan to evaluate daily for signs of renal recovery with HD M,W,F as needed. May try diuretics on non HD days per AHF team.    ID: Completed cefepime  and vancomycin  due to respiratory culture with enterobacter. No leukocytosis, afebrile. On diflucan  for candidemia.    Expected postop ABLA and anemia of chronic disease: Hct stable 32% yesterday. Thrombocytopenia resolved.    DVT Prophylaxis: On Eliquis    Deconditioning: Severe deconditioning, CIR following. Her insurance declined CIR twice prior to her 2nd surgery so not sure if they will accept this time around but it would be the best option for her. If insurance does not approve CIR will need SNF. Continue to work with PT/OT.    Dispo: Insurance authorization for [employer] pending.     LOS: 66 days  Monterrio Gerst G. Dura Mccormack, PA-C 03/08/2024    "

## 2024-03-08 NOTE — Progress Notes (Signed)
 Physical Therapy Treatment Patient Details Name: Tracy Baker MRN: [id number] DOB: [birth year] Today's Date: 03/08/2024   History of Present Illness 69 y.o. female adm 12/29/2023 after leaving AMA from [facility] (where she was on vacation due to lack of formal transfer) where she was being treated for C. Albicans candidemia. 10/28 TEE with mitral valve vegetation. 11/3 teeth Extractions. 11/6 Mitral valve repair. 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 Extubated 12/15. PMHx: thyroid  disease.    PT Comments  Progressing towards acute functional goals.  Up to min assist to rise to EOB with cues for sternal precautions. Min assist to stand from EOB x2, and recliner x3, demonstrating posterior lean despite cues and RW use. This did improve with repetition however. Tolerated total of >60 feet ambulation but required 3 bouts to complete (15, 20, and 28 feet respectively.) Frequent min assist for balance and RW control. VSS throughout on RA. Patient will continue to benefit from skilled physical therapy services to further improve independence with functional mobility. In recliner at end of session, RN notified, encouraged pt OOB several times per day - avoid prolonged immobility in chair and bed. Patient will benefit from intensive inpatient follow-up therapy, >3 hours/day    If plan is discharge home, recommend the following: Assistance with cooking/housework;Assist for transportation;Help with stairs or ramp for entrance;Assistance with feeding;Direct supervision/assist for medications management;Direct supervision/assist for financial management;A lot of help with walking and/or transfers;A lot of help with bathing/dressing/bathroom   Can travel by private vehicle        Equipment Recommendations  Rolling walker (2 wheels)    Recommendations for Other Services Rehab consult     Precautions / Restrictions  Precautions Precautions: Fall;Sternal Recall of Precautions/Restrictions: Impaired Precaution/Restrictions Comments: cortak Restrictions Weight Bearing Restrictions Per Provider Order: No RUE Weight Bearing Per Provider Order: Non weight bearing LUE Weight Bearing Per Provider Order: Non weight bearing Other Position/Activity Restrictions: Cardiac sternal precautions     Mobility  Bed Mobility Overal bed mobility: Needs Assistance Bed Mobility: Rolling, Sidelying to Sit Rolling: Contact guard assist Sidelying to sit: Min assist, HOB elevated       General bed mobility comments: CGA to roll with cues for LEs off bed for leverage. Min assist for trunk support to rise to EOB.    Transfers Overall transfer level: Needs assistance Equipment used: Rolling walker (2 wheels) Transfers: Sit to/from Stand Sit to Stand: Min assist           General transfer comment: Min assist for boost to stand from bed x2 and recliner x3. Pt required cues each times for set-up (scoot forward, hands in lap,) demonstrating poor recall and carryover. Had a heavy posterior lean but this seemed to improve a bit with each repetition.    Ambulation/Gait Ambulation/Gait assistance: Min assist Gait Distance (Feet): 28 Feet (+15,+20 (63 total)) Assistive device: Rolling walker (2 wheels) Gait Pattern/deviations: Decreased stride length, Drifts right/left, Trunk flexed, Narrow base of support, Step-through pattern, Leaning posteriorly Gait velocity: decreased Gait velocity interpretation: <1.31 ft/sec, indicative of household ambulator   General Gait Details: Cues for larger step length and to widen foot width with fair carryover. Frequent min assist for balance (multidirectional instability.) Mild buckling of Rt knee at times but able to stablize with RW adequately. VSS throughout on RA. Completed 63 feet total with 3 seperate bouts, requiring seated rest.   Stairs  Wheelchair  Mobility     Tilt Bed    Modified Rankin (Stroke Patients Only)       Balance Overall balance assessment: Needs assistance Sitting-balance support: No upper extremity supported, Feet supported Sitting balance-Leahy Scale: Fair Sitting balance - Comments: close supervision EOB   Standing balance support: Bilateral upper extremity supported, Reliant on assistive device for balance Standing balance-Leahy Scale: Poor Standing balance comment: Min A                            Communication Communication Communication: Impaired Factors Affecting Communication: Reduced clarity of speech (soft spoken)  Cognition Arousal: Alert Behavior During Therapy: Flat affect   PT - Cognitive impairments: Awareness, Attention, Memory, Sequencing, Safety/Judgement, No family/caregiver present to determine baseline, Problem solving                         Following commands: Impaired Following commands impaired: Follows one step commands inconsistently, Follows one step commands with increased time    Cueing Cueing Techniques: Tactile cues, Gestural cues, Verbal cues  Exercises Other Exercises Other Exercises: Verbally reviewed exercises, reports compliance (LAQ, ankle pumps, IS use.)    General Comments General comments (skin integrity, edema, etc.): VSS: SpO2 98% on RA, HR 79      Pertinent Vitals/Pain Pain Assessment Pain Assessment: No/denies pain    Home Living                          Prior Function            PT Goals (current goals can now be found in the care plan section) Acute Rehab PT Goals Patient Stated Goal: Get stronger, go home Progress towards PT goals: Progressing toward goals    Frequency    Min 3X/week      PT Plan      Co-evaluation              AM-PAC PT 6 Clicks Mobility   Outcome Measure  Help needed turning from your back to your side while in a flat bed without using bedrails?: A Little Help needed  moving from lying on your back to sitting on the side of a flat bed without using bedrails?: A Little Help needed moving to and from a bed to a chair (including a wheelchair)?: A Little Help needed standing up from a chair using your arms (e.g., wheelchair or bedside chair)?: A Little Help needed to walk in hospital room?: A Lot Help needed climbing 3-5 steps with a railing? : Total 6 Click Score: 15    End of Session Equipment Utilized During Treatment: Gait belt Activity Tolerance: Patient tolerated treatment well Patient left: with call bell/phone within reach;in chair;with chair alarm set Nurse Communication: Mobility status PT Visit Diagnosis: Other abnormalities of gait and mobility (R26.89);Muscle weakness (generalized) (M62.81);Difficulty in walking, not elsewhere classified (R26.2);Unsteadiness on feet (R26.81)     Time: 8580-8550 PT Time Calculation (min) (ACUTE ONLY): 30 min  Charges:    $Gait Training: 8-22 mins $Therapeutic Activity: 8-22 mins PT General Charges $$ ACUTE PT VISIT: 1 Visit                     [name], PT, DPT [facility]  Rehabilitation Services Physical Therapist Office: [phone] Website: [email or web address]    [name] 03/08/2024, 3:28 PM

## 2024-03-09 LAB — RENAL FUNCTION PANEL
Albumin: 3.9 g/dL (ref 3.5–5.0)
Anion gap: 18 — ABNORMAL HIGH (ref 5–15)
BUN: 108 mg/dL — ABNORMAL HIGH (ref 8–23)
CO2: 23 mmol/L (ref 22–32)
Calcium: 9.7 mg/dL (ref 8.9–10.3)
Chloride: 92 mmol/L — ABNORMAL LOW (ref 98–111)
Creatinine, Ser: 3.51 mg/dL — ABNORMAL HIGH (ref 0.44–1.00)
GFR, Estimated: 14 mL/min — ABNORMAL LOW
Glucose, Bld: 162 mg/dL — ABNORMAL HIGH (ref 70–99)
Phosphorus: 6.6 mg/dL — ABNORMAL HIGH (ref 2.5–4.6)
Potassium: 4.2 mmol/L (ref 3.5–5.1)
Sodium: 133 mmol/L — ABNORMAL LOW (ref 135–145)

## 2024-03-09 LAB — GLUCOSE, CAPILLARY
Glucose-Capillary: 103 mg/dL — ABNORMAL HIGH (ref 70–99)
Glucose-Capillary: 123 mg/dL — ABNORMAL HIGH (ref 70–99)
Glucose-Capillary: 125 mg/dL — ABNORMAL HIGH (ref 70–99)
Glucose-Capillary: 134 mg/dL — ABNORMAL HIGH (ref 70–99)
Glucose-Capillary: 142 mg/dL — ABNORMAL HIGH (ref 70–99)
Glucose-Capillary: 159 mg/dL — ABNORMAL HIGH (ref 70–99)
Glucose-Capillary: 163 mg/dL — ABNORMAL HIGH (ref 70–99)

## 2024-03-09 LAB — MAGNESIUM: Magnesium: 2.5 mg/dL — ABNORMAL HIGH (ref 1.7–2.4)

## 2024-03-09 MED ORDER — MIRTAZAPINE 7.5 MG PO TABS
30.0000 mg | ORAL_TABLET | Freq: Every day | ORAL | Status: DC
  Administered 2024-03-09: 30 mg via ORAL
  Filled 2024-03-09: qty 4

## 2024-03-09 MED ORDER — MIRTAZAPINE 7.5 MG PO TABS: 45.0000 mg | ORAL_TABLET | Freq: Every day | ORAL | Status: DC

## 2024-03-09 MED ORDER — MEGESTROL ACETATE 40 MG PO TABS
80.0000 mg | ORAL_TABLET | Freq: Two times a day (BID) | ORAL | Status: DC
  Administered 2024-03-09 – 2024-03-10 (×3): 80 mg via ORAL
  Filled 2024-03-09 (×4): qty 2

## 2024-03-09 NOTE — Progress Notes (Signed)
 Inpatient Rehab Admissions Coordinator:  Insurance is offering a peer-to-peer that has to be completed by 12pm on 03/10/24. Will continue to follow.   Tinnie Yvone Cohens, MS, CCC-SLP Admissions Coordinator [phone]

## 2024-03-09 NOTE — Plan of Care (Signed)

## 2024-03-09 NOTE — Progress Notes (Signed)
 " [facility] Progress Note    Assessment/ Plan:   Dialysis dependent AKI, initiated 02/16/2024: Creatinine normal at baseline Currently on MWF schedule: 3.5h, 2-3L UF, no heparin . TDC LIJ placed 12/22.  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 On antifungal therapy  Anemia -transfuse PRN for Hgb <7; Hb 10s  Hyponatremia: mild and intermittent.  Manage volume with dialysis, na/fluid restriction  Deconditioning: PT/OT following, will need to dialyze in chair this week if moving towards SNF, in meantime spend more time in chair in room  Will follow.    Subjective:   No interval events. 0.15L UOP yest, 2.5L with HD Says dialysis was tiring but tolerated it without cramps, dizzines    Objective:   BP (!) 97/58 (BP Location: Left Arm)   Pulse 70   Temp 98.1 F (36.7 C) (Oral)   Resp 17   Ht 5' 4 (1.626 m)   Wt 64.4 kg   SpO2 96%   BMI 24.37 kg/m   Intake/Output Summary (Last 24 hours) at 03/09/2024 1006 Last data filed at 03/09/2024 0944 Gross per 24 hour  Intake 5600 ml  Output 200 ml  Net 5400 ml    Weight change: 0.4 kg  Physical Exam: Gen: tired appearing, NAD, lying flat in bed  CVS: normal rate, no rub Resp: bl chest rise, normal wob Abd: soft Ext: No significant edema noted Dialysis access:LIJ tunneled  HD cath c/d/i  Imaging: No results found.      Labs: BMET Recent Labs  Lab 03/03/24 0450 03/04/24 0521 03/05/24 0559 03/06/24 0327 03/07/24 0508 03/08/24 0500 03/09/24 0435  NA 133* 134* 132* 132* 131* 132* 133*  K 5.2* 4.3 4.6 4.3 4.7 4.1 4.2  CL 93* 93* 91* 93* 92* 91* 92*  CO2 25 27 22 25  21* 25 23  GLUCOSE 179* 141* 134* 159* 147* 185* 162*  BUN 80* 52* 85* 59* 102* 71* 108*  CREATININE 3.24* 2.43* 3.32* 2.46* 3.27*  2.80* 3.51*  CALCIUM  9.3 9.5 9.7 9.5 9.8 9.6 9.7  PHOS 5.1* 4.6 6.1* 5.4* 6.6* 5.7* 6.6*   CBC Recent Labs  Lab 03/03/24 0450 03/04/24 0520 03/05/24 0559 03/07/24 0730  WBC 4.3 4.9 4.7 6.0  NEUTROABS  --   --   --  3.8  HGB 9.6* 10.4* 10.1* 10.9*  HCT 29.7* 31.9* 31.1* 32.1*  MCV 98.7 98.2 98.7 95.5  PLT 136* 162 153 155    Medications:     amiodarone   200 mg Oral Daily   apixaban   5 mg Oral BID   clonazePAM   0.5 mg Oral QHS   feeding supplement  237 mL Oral TID BM   feeding supplement (PROSource TF20)  60 mL Per Tube BID   feeding supplement (VITAL 1.5 CAL)  1,000 mL Per Tube Q24H   Gerhardt's butt cream   Topical BID   insulin  aspart  0-9 Units Subcutaneous Q4H   megestrol   80 mg Oral BID   mirtazapine   30 mg Oral QHS   mouth rinse  15 mL Mouth Rinse 4 times per day   pantoprazole   40 mg Oral Daily   QUEtiapine   25 mg Oral QHS   sertraline   25 mg Oral QHS   sodium chloride  flush  10-40 mL Intracatheter Q12H       "

## 2024-03-09 NOTE — Progress Notes (Signed)
 29 Days Post-Op Procedures (LRB): ECMO CANNULATION (N/A) Subjective: Asleep when I entered room C/o of poor appetite at time of meal delivery  Objective: Vital signs in last 24 hours: Temp:  [97.6 F (36.4 C)-98.1 F (36.7 C)] 98.1 F (36.7 C) (01/04 0745) Pulse Rate:  [69-78] 70 (01/04 0745) Cardiac Rhythm: Normal sinus rhythm;Bundle branch block;Heart block (01/04 0735) Resp:  [12-17] 17 (01/04 0745) BP: (97-123)/(58-72) 97/58 (01/04 0745) SpO2:  [93 %-98 %] 96 % (01/04 0745) Weight:  [64.4 kg] 64.4 kg (01/04 0431)  Hemodynamic parameters for last 24 hours:    Intake/Output from previous day: 01/03 0701 - 01/04 0700 In: 5600 [P.O.:240; NG/GT:4360; IV Piggyback:1000] Out: -  Intake/Output this shift: No intake/output data recorded.  General appearance: alert, cooperative, and no distress Neurologic: flat affect Heart: regular rate and rhythm Lungs: diminished breath sounds bibasilar Wound: clean and intact  Lab Results: Recent Labs    03/07/24 0730  WBC 6.0  HGB 10.9*  HCT 32.1*  PLT 155   BMET:  Recent Labs    03/08/24 0500 03/09/24 0435  NA 132* 133*  K 4.1 4.2  CL 91* 92*  CO2 25 23  GLUCOSE 185* 162*  BUN 71* 108*  CREATININE 2.80* 3.51*  CALCIUM  9.6 9.7    PT/INR: No results for input(s): LABPROT, INR in the last 72 hours. ABG    Component Value Date/Time   PHART 7.340 (L) 02/13/2024 1618   HCO3 18.0 (L) 02/13/2024 1618   TCO2 19 (L) 02/13/2024 1618   ACIDBASEDEF 7.0 (H) 02/13/2024 1618   O2SAT 62 03/07/2024 0503   CBG (last 3)  Recent Labs    03/09/24 0000 03/09/24 0326 03/09/24 0750  GLUCAP 123* 159* 125*    Assessment/Plan: S/P Procedures (LRB): ECMO CANNULATION (N/A) - NEURO- intact PSYCH- flat affect, situational depression  On Remeron  and Zoloft  CV- in SR on Po amiodarone   On Eliquis  for atrial fib with tissue valve in place RESP- no issues RENAL- acute renal failure  HD M,W,F  No significant UO yesterday ENDO-  CBG Ok  Will dc Lantus , continue SSI  Restart lantus  if CBG increase significantly GI- PO intake remains poor  Continue supplementary overnight TF ID- remains on Diflucan  IV Deconditioning- severe, continue PT/OT  LOS: 67 days    Tracy Baker 03/09/2024

## 2024-03-10 ENCOUNTER — Inpatient Hospital Stay (HOSPITAL_COMMUNITY)
Admission: AD | Admit: 2024-03-10 | Discharge: 2024-04-02 | DRG: 945 | Disposition: A | Discharge status: Home Health | Source: Intra-hospital | Attending: Physical Medicine and Rehabilitation | Admitting: Physical Medicine and Rehabilitation

## 2024-03-10 ENCOUNTER — Other Ambulatory Visit: Payer: Self-pay

## 2024-03-10 ENCOUNTER — Encounter (HOSPITAL_COMMUNITY): Payer: Self-pay | Admitting: Thoracic Surgery (Cardiothoracic Vascular Surgery)

## 2024-03-10 ENCOUNTER — Encounter (HOSPITAL_COMMUNITY): Payer: Self-pay | Admitting: Physical Medicine and Rehabilitation

## 2024-03-10 DIAGNOSIS — L89316 Pressure-induced deep tissue damage of right buttock: Secondary | ICD-10-CM | POA: Diagnosis present

## 2024-03-10 DIAGNOSIS — R110 Nausea: Secondary | ICD-10-CM | POA: Diagnosis not present

## 2024-03-10 DIAGNOSIS — D638 Anemia in other chronic diseases classified elsewhere: Secondary | ICD-10-CM

## 2024-03-10 DIAGNOSIS — Z885 Allergy status to narcotic agent status: Secondary | ICD-10-CM

## 2024-03-10 DIAGNOSIS — L309 Dermatitis, unspecified: Secondary | ICD-10-CM | POA: Diagnosis present

## 2024-03-10 DIAGNOSIS — F32A Depression, unspecified: Secondary | ICD-10-CM | POA: Diagnosis present

## 2024-03-10 DIAGNOSIS — Z9049 Acquired absence of other specified parts of digestive tract: Secondary | ICD-10-CM

## 2024-03-10 DIAGNOSIS — I959 Hypotension, unspecified: Secondary | ICD-10-CM | POA: Diagnosis not present

## 2024-03-10 DIAGNOSIS — D631 Anemia in chronic kidney disease: Secondary | ICD-10-CM | POA: Diagnosis present

## 2024-03-10 DIAGNOSIS — L89896 Pressure-induced deep tissue damage of other site: Secondary | ICD-10-CM | POA: Diagnosis present

## 2024-03-10 DIAGNOSIS — F17210 Nicotine dependence, cigarettes, uncomplicated: Secondary | ICD-10-CM | POA: Diagnosis present

## 2024-03-10 DIAGNOSIS — I480 Paroxysmal atrial fibrillation: Secondary | ICD-10-CM

## 2024-03-10 DIAGNOSIS — B488 Other specified mycoses: Secondary | ICD-10-CM | POA: Diagnosis present

## 2024-03-10 DIAGNOSIS — R630 Anorexia: Secondary | ICD-10-CM | POA: Diagnosis not present

## 2024-03-10 DIAGNOSIS — Z79899 Other long term (current) drug therapy: Secondary | ICD-10-CM

## 2024-03-10 DIAGNOSIS — R5381 Other malaise: Secondary | ICD-10-CM | POA: Diagnosis present

## 2024-03-10 DIAGNOSIS — L539 Erythematous condition, unspecified: Secondary | ICD-10-CM | POA: Diagnosis present

## 2024-03-10 DIAGNOSIS — Z7901 Long term (current) use of anticoagulants: Secondary | ICD-10-CM | POA: Diagnosis not present

## 2024-03-10 DIAGNOSIS — Z992 Dependence on renal dialysis: Secondary | ICD-10-CM | POA: Diagnosis not present

## 2024-03-10 DIAGNOSIS — E1122 Type 2 diabetes mellitus with diabetic chronic kidney disease: Secondary | ICD-10-CM | POA: Diagnosis present

## 2024-03-10 DIAGNOSIS — N390 Urinary tract infection, site not specified: Secondary | ICD-10-CM | POA: Diagnosis not present

## 2024-03-10 DIAGNOSIS — R5383 Other fatigue: Secondary | ICD-10-CM | POA: Diagnosis not present

## 2024-03-10 DIAGNOSIS — D6489 Other specified anemias: Secondary | ICD-10-CM | POA: Insufficient documentation

## 2024-03-10 DIAGNOSIS — G4700 Insomnia, unspecified: Secondary | ICD-10-CM | POA: Diagnosis present

## 2024-03-10 DIAGNOSIS — N179 Acute kidney failure, unspecified: Secondary | ICD-10-CM | POA: Diagnosis present

## 2024-03-10 DIAGNOSIS — I951 Orthostatic hypotension: Secondary | ICD-10-CM | POA: Diagnosis present

## 2024-03-10 DIAGNOSIS — Z952 Presence of prosthetic heart valve: Secondary | ICD-10-CM

## 2024-03-10 DIAGNOSIS — E871 Hypo-osmolality and hyponatremia: Secondary | ICD-10-CM | POA: Diagnosis present

## 2024-03-10 DIAGNOSIS — I38 Endocarditis, valve unspecified: Secondary | ICD-10-CM | POA: Diagnosis not present

## 2024-03-10 DIAGNOSIS — E1140 Type 2 diabetes mellitus with diabetic neuropathy, unspecified: Secondary | ICD-10-CM | POA: Diagnosis present

## 2024-03-10 DIAGNOSIS — B377 Candidal sepsis: Secondary | ICD-10-CM | POA: Diagnosis not present

## 2024-03-10 DIAGNOSIS — B9689 Other specified bacterial agents as the cause of diseases classified elsewhere: Secondary | ICD-10-CM | POA: Diagnosis not present

## 2024-03-10 DIAGNOSIS — F4322 Adjustment disorder with anxiety: Secondary | ICD-10-CM | POA: Diagnosis present

## 2024-03-10 DIAGNOSIS — D696 Thrombocytopenia, unspecified: Secondary | ICD-10-CM | POA: Diagnosis present

## 2024-03-10 DIAGNOSIS — R251 Tremor, unspecified: Secondary | ICD-10-CM | POA: Diagnosis not present

## 2024-03-10 DIAGNOSIS — L89152 Pressure ulcer of sacral region, stage 2: Secondary | ICD-10-CM | POA: Diagnosis present

## 2024-03-10 DIAGNOSIS — I4891 Unspecified atrial fibrillation: Secondary | ICD-10-CM | POA: Diagnosis present

## 2024-03-10 DIAGNOSIS — L89326 Pressure-induced deep tissue damage of left buttock: Secondary | ICD-10-CM | POA: Diagnosis present

## 2024-03-10 DIAGNOSIS — M25571 Pain in right ankle and joints of right foot: Secondary | ICD-10-CM | POA: Diagnosis present

## 2024-03-10 DIAGNOSIS — N3000 Acute cystitis without hematuria: Secondary | ICD-10-CM | POA: Diagnosis not present

## 2024-03-10 DIAGNOSIS — I330 Acute and subacute infective endocarditis: Secondary | ICD-10-CM | POA: Diagnosis present

## 2024-03-10 DIAGNOSIS — K219 Gastro-esophageal reflux disease without esophagitis: Secondary | ICD-10-CM

## 2024-03-10 DIAGNOSIS — L8990 Pressure ulcer of unspecified site, unspecified stage: Secondary | ICD-10-CM | POA: Insufficient documentation

## 2024-03-10 LAB — RENAL FUNCTION PANEL
Albumin: 3.8 g/dL (ref 3.5–5.0)
Anion gap: 18 — ABNORMAL HIGH (ref 5–15)
BUN: 139 mg/dL — ABNORMAL HIGH (ref 8–23)
CO2: 23 mmol/L (ref 22–32)
Calcium: 9.6 mg/dL (ref 8.9–10.3)
Chloride: 93 mmol/L — ABNORMAL LOW (ref 98–111)
Creatinine, Ser: 3.68 mg/dL — ABNORMAL HIGH (ref 0.44–1.00)
GFR, Estimated: 13 mL/min — ABNORMAL LOW
Glucose, Bld: 166 mg/dL — ABNORMAL HIGH (ref 70–99)
Phosphorus: 6.5 mg/dL — ABNORMAL HIGH (ref 2.5–4.6)
Potassium: 4.3 mmol/L (ref 3.5–5.1)
Sodium: 133 mmol/L — ABNORMAL LOW (ref 135–145)

## 2024-03-10 LAB — GLUCOSE, CAPILLARY
Glucose-Capillary: 175 mg/dL — ABNORMAL HIGH (ref 70–99)
Glucose-Capillary: 110 mg/dL — ABNORMAL HIGH (ref 70–99)
Glucose-Capillary: 172 mg/dL — ABNORMAL HIGH (ref 70–99)
Glucose-Capillary: 111 mg/dL — ABNORMAL HIGH (ref 70–99)
Glucose-Capillary: 150 mg/dL — ABNORMAL HIGH (ref 70–99)
Glucose-Capillary: 136 mg/dL — ABNORMAL HIGH (ref 70–99)

## 2024-03-10 LAB — MAGNESIUM: Magnesium: 2.5 mg/dL — ABNORMAL HIGH (ref 1.7–2.4)

## 2024-03-10 MED ORDER — SERTRALINE HCL 50 MG PO TABS
25.0000 mg | ORAL_TABLET | Freq: Every day | ORAL | Status: DC
  Administered 2024-03-10 – 2024-04-01 (×23): 25 mg via ORAL
  Filled 2024-03-10 (×23): qty 1

## 2024-03-10 MED ORDER — ENSURE PLUS HIGH PROTEIN PO LIQD: 237.0000 mL | Freq: Three times a day (TID) | ORAL | Status: DC

## 2024-03-10 MED ORDER — ACETAMINOPHEN 325 MG PO TABS
325.0000 mg | ORAL_TABLET | ORAL | Status: DC | PRN
  Administered 2024-03-14: 650 mg via ORAL
  Filled 2024-03-10: qty 2

## 2024-03-10 MED ORDER — APIXABAN 5 MG PO TABS: 5.0000 mg | ORAL_TABLET | Freq: Two times a day (BID) | ORAL | Status: DC

## 2024-03-10 MED ORDER — AMIODARONE HCL 200 MG PO TABS: 200.0000 mg | ORAL_TABLET | Freq: Every day | ORAL | Status: DC

## 2024-03-10 MED ORDER — MIRTAZAPINE 30 MG PO TABS: 30.0000 mg | ORAL_TABLET | Freq: Every day | ORAL | Status: DC

## 2024-03-10 MED ORDER — HEPARIN SODIUM (PORCINE) 1000 UNIT/ML IJ SOLN
INTRAMUSCULAR | Status: AC
  Filled 2024-03-10: qty 5

## 2024-03-10 MED ORDER — MILK AND MOLASSES ENEMA: 1.0000 | Freq: Every day | RECTAL | Status: DC | PRN

## 2024-03-10 MED ORDER — SERTRALINE HCL 25 MG PO TABS: 25.0000 mg | ORAL_TABLET | Freq: Every day | ORAL | Status: DC

## 2024-03-10 MED ORDER — FLUCONAZOLE IN SODIUM CHLORIDE 400-0.9 MG/200ML-% IV SOLN: 400.0000 mg | INTRAVENOUS | Status: DC

## 2024-03-10 MED ORDER — PROMETHAZINE HCL 12.5 MG RE SUPP: 12.5000 mg | Freq: Four times a day (QID) | RECTAL | Status: DC | PRN

## 2024-03-10 MED ORDER — PANTOPRAZOLE SODIUM 40 MG PO TBEC
40.0000 mg | DELAYED_RELEASE_TABLET | Freq: Every day | ORAL | Status: DC
  Administered 2024-03-11: 40 mg via ORAL
  Filled 2024-03-10: qty 1

## 2024-03-10 MED ORDER — MAGIC MOUTHWASH: 5.0000 mL | Freq: Three times a day (TID) | ORAL | Status: DC | PRN

## 2024-03-10 MED ORDER — PROMETHAZINE (PHENERGAN) 6.25MG IN NS 50ML IVPB: 6.2500 mg | Freq: Four times a day (QID) | INTRAVENOUS | Status: DC | PRN

## 2024-03-10 MED ORDER — QUETIAPINE FUMARATE 25 MG PO TABS: 25.0000 mg | ORAL_TABLET | Freq: Every day | ORAL | Status: DC

## 2024-03-10 MED ORDER — PENTAFLUOROPROP-TETRAFLUOROETH EX AERO: 1.0000 | INHALATION_SPRAY | CUTANEOUS | Status: DC | PRN

## 2024-03-10 MED ORDER — FLUCONAZOLE 200 MG PO TABS
400.0000 mg | ORAL_TABLET | Freq: Every day | ORAL | Status: DC
  Filled 2024-03-10: qty 2

## 2024-03-10 MED ORDER — INSULIN ASPART 100 UNIT/ML IJ SOLN
0.0000 [IU] | Freq: Three times a day (TID) | INTRAMUSCULAR | Status: DC
  Administered 2024-03-10: 1 [IU] via SUBCUTANEOUS
  Administered 2024-03-11: 2 [IU] via SUBCUTANEOUS
  Administered 2024-03-11 (×2): 1 [IU] via SUBCUTANEOUS
  Administered 2024-03-12: 2 [IU] via SUBCUTANEOUS
  Administered 2024-03-12 (×2): 1 [IU] via SUBCUTANEOUS
  Administered 2024-03-13: 3 [IU] via SUBCUTANEOUS
  Administered 2024-03-13 – 2024-03-14 (×4): 1 [IU] via SUBCUTANEOUS
  Administered 2024-03-14: 2 [IU] via SUBCUTANEOUS
  Administered 2024-03-15: 1 [IU] via SUBCUTANEOUS
  Administered 2024-03-15: 3 [IU] via SUBCUTANEOUS
  Administered 2024-03-16 – 2024-03-17 (×5): 1 [IU] via SUBCUTANEOUS
  Administered 2024-03-18 – 2024-03-19 (×2): 2 [IU] via SUBCUTANEOUS
  Administered 2024-03-20 (×2): 1 [IU] via SUBCUTANEOUS
  Filled 2024-03-10 (×3): qty 1
  Filled 2024-03-10: qty 3
  Filled 2024-03-10 (×3): qty 1
  Filled 2024-03-10: qty 2
  Filled 2024-03-10 (×4): qty 1
  Filled 2024-03-10 (×2): qty 2
  Filled 2024-03-10: qty 1
  Filled 2024-03-10: qty 2
  Filled 2024-03-10: qty 4
  Filled 2024-03-10 (×2): qty 1
  Filled 2024-03-10: qty 2
  Filled 2024-03-10: qty 1

## 2024-03-10 MED ORDER — ENSURE PLUS HIGH PROTEIN PO LIQD: 237.0000 mL | Freq: Three times a day (TID) | ORAL | Status: AC

## 2024-03-10 MED ORDER — MEGESTROL ACETATE 40 MG PO TABS
80.0000 mg | ORAL_TABLET | Freq: Two times a day (BID) | ORAL | Status: DC
  Administered 2024-03-10 – 2024-03-25 (×26): 80 mg via ORAL
  Filled 2024-03-10 (×30): qty 2

## 2024-03-10 MED ORDER — SORBITOL 70 % SOLN: 30.0000 mL | Freq: Two times a day (BID) | Status: DC | PRN

## 2024-03-10 MED ORDER — FLUCONAZOLE 200 MG PO TABS
400.0000 mg | ORAL_TABLET | Freq: Every day | ORAL | Status: DC
  Administered 2024-03-11: 400 mg via ORAL
  Filled 2024-03-10 (×2): qty 2

## 2024-03-10 MED ORDER — MELATONIN 5 MG PO TABS: 5.0000 mg | ORAL_TABLET | Freq: Every evening | ORAL | Status: DC | PRN

## 2024-03-10 MED ORDER — VITAL 1.5 CAL PO LIQD: 1000.0000 mL | ORAL | Status: DC

## 2024-03-10 MED ORDER — AMIODARONE HCL 200 MG PO TABS
200.0000 mg | ORAL_TABLET | Freq: Every day | ORAL | Status: DC
  Administered 2024-03-11 – 2024-04-02 (×23): 200 mg via ORAL
  Filled 2024-03-10 (×23): qty 1

## 2024-03-10 MED ORDER — CLONAZEPAM 0.5 MG PO TABS
0.5000 mg | ORAL_TABLET | Freq: Every day | ORAL | Status: DC
  Administered 2024-03-10 – 2024-03-12 (×3): 0.5 mg via ORAL
  Filled 2024-03-10 (×3): qty 1

## 2024-03-10 MED ORDER — APIXABAN 5 MG PO TABS
5.0000 mg | ORAL_TABLET | Freq: Two times a day (BID) | ORAL | Status: DC
  Administered 2024-03-10 – 2024-04-02 (×45): 5 mg via ORAL
  Filled 2024-03-10 (×47): qty 1

## 2024-03-10 MED ORDER — FLUCONAZOLE 200 MG PO TABS: 400.0000 mg | ORAL_TABLET | Freq: Every day | ORAL | Status: DC

## 2024-03-10 MED ORDER — LIDOCAINE-PRILOCAINE 2.5-2.5 % EX CREA: 1.0000 | TOPICAL_CREAM | CUTANEOUS | Status: DC | PRN

## 2024-03-10 MED ORDER — QUETIAPINE FUMARATE 25 MG PO TABS
25.0000 mg | ORAL_TABLET | Freq: Every day | ORAL | Status: DC
  Administered 2024-03-10 – 2024-03-11 (×2): 25 mg via ORAL
  Filled 2024-03-10 (×2): qty 1

## 2024-03-10 MED ORDER — ACETAMINOPHEN 325 MG PO TABS: 650.0000 mg | ORAL_TABLET | Freq: Four times a day (QID) | ORAL | Status: AC | PRN

## 2024-03-10 MED ORDER — POLYETHYLENE GLYCOL 3350 17 G PO PACK: 17.0000 g | PACK | Freq: Every day | ORAL | Status: DC | PRN

## 2024-03-10 MED ORDER — PROSOURCE TF20 ENFIT COMPATIBL EN LIQD
60.0000 mL | Freq: Two times a day (BID) | ENTERAL | Status: DC
  Administered 2024-03-10 – 2024-03-13 (×6): 60 mL
  Filled 2024-03-10 (×7): qty 60

## 2024-03-10 MED ORDER — FLUCONAZOLE 200 MG PO TABS
200.0000 mg | ORAL_TABLET | Freq: Every day | ORAL | Status: DC
  Filled 2024-03-10: qty 1

## 2024-03-10 MED ORDER — DEXTROMETHORPHAN POLISTIREX ER 30 MG/5ML PO SUER: 15.0000 mg | Freq: Four times a day (QID) | ORAL | Status: DC | PRN

## 2024-03-10 MED ORDER — FLUCONAZOLE 200 MG PO TABS
400.0000 mg | ORAL_TABLET | Freq: Every day | ORAL | Status: DC
  Administered 2024-03-10: 400 mg via ORAL
  Filled 2024-03-10: qty 2

## 2024-03-10 MED ORDER — SIMETHICONE 80 MG PO CHEW
80.0000 mg | CHEWABLE_TABLET | Freq: Four times a day (QID) | ORAL | Status: DC | PRN
  Administered 2024-03-30: 80 mg via ORAL

## 2024-03-10 MED ORDER — ALUMINUM HYDROXIDE GEL 320 MG/5ML PO SUSP: 10.0000 mL | Freq: Four times a day (QID) | ORAL | Status: DC | PRN

## 2024-03-10 MED ORDER — GERHARDT'S BUTT CREAM
TOPICAL_CREAM | Freq: Two times a day (BID) | CUTANEOUS | Status: DC
  Filled 2024-03-10: qty 60

## 2024-03-10 MED ORDER — PROMETHAZINE HCL 12.5 MG PO TABS
12.5000 mg | ORAL_TABLET | Freq: Four times a day (QID) | ORAL | Status: DC | PRN
  Administered 2024-03-29: 12.5 mg via ORAL
  Filled 2024-03-10 (×2): qty 1

## 2024-03-10 MED ORDER — ENSURE PLUS HIGH PROTEIN PO LIQD
237.0000 mL | Freq: Three times a day (TID) | ORAL | Status: DC
  Administered 2024-03-11 (×2): 237 mL via ORAL

## 2024-03-10 MED ORDER — MIRTAZAPINE 15 MG PO TABS
30.0000 mg | ORAL_TABLET | Freq: Every day | ORAL | Status: DC
  Administered 2024-03-10 – 2024-03-23 (×14): 30 mg via ORAL
  Filled 2024-03-10 (×14): qty 2

## 2024-03-10 MED ORDER — PROSOURCE TF20 ENFIT COMPATIBL EN LIQD: 60.0000 mL | Freq: Two times a day (BID) | ENTERAL | Status: DC

## 2024-03-10 MED ORDER — MEGESTROL ACETATE 40 MG PO TABS: 80.0000 mg | ORAL_TABLET | Freq: Two times a day (BID) | ORAL | Status: DC

## 2024-03-10 MED ORDER — BIOTENE DRY MOUTH MT LIQD: 15.0000 mL | OROMUCOSAL | Status: DC | PRN

## 2024-03-10 MED ORDER — PROMETHAZINE HCL 25 MG/ML IJ SOLN: 12.5000 mg | Freq: Four times a day (QID) | INTRAMUSCULAR | Status: DC | PRN

## 2024-03-10 MED ORDER — ORAL CARE MOUTH RINSE
15.0000 mL | OROMUCOSAL | Status: DC
  Administered 2024-03-11 – 2024-04-02 (×73): 15 mL via OROMUCOSAL

## 2024-03-10 MED ORDER — ACETAMINOPHEN 325 MG PO TABS: 650.0000 mg | ORAL_TABLET | Freq: Four times a day (QID) | ORAL | Status: DC | PRN

## 2024-03-10 MED ORDER — LIDOCAINE HCL (PF) 1 % IJ SOLN: 5.0000 mL | INTRAMUSCULAR | Status: DC | PRN

## 2024-03-10 MED ORDER — ALBUTEROL SULFATE (2.5 MG/3ML) 0.083% IN NEBU: 2.5000 mg | INHALATION_SOLUTION | RESPIRATORY_TRACT | Status: DC | PRN

## 2024-03-10 NOTE — Progress Notes (Signed)
 Inpatient Rehabilitation Admission Medication Review by a Pharmacist  A complete drug regimen review was completed for this patient to identify any potential clinically significant medication issues.  High Risk Drug Classes Is patient taking? Indication by Medication  Antipsychotic Yes Quetiapine  - mood  Anticoagulant Yes Apixaban  - afib  Antibiotic Yes Fluconazole  - fungal endocarditis  Opioid No   Antiplatelet No   Hypoglycemics/insulin  Yes Insulin  aspart SSI - hyperglycemia  Vasoactive Medication Yes Amiodarone  - afib  Chemotherapy No   Other Yes APAP - pain Albuterol  - prn ShOB Alum hydroxide - prn indigestion Clonazepam  - anxiety, sleep Dextromethorphan  - prn cough Megace  - appetite stimulant Mirtazapine  - appitite stimulant, mood Miralax  - bowel regimen Vital Tube Feeds - nutrition support Ondansetron  - nausea Pantoprazole  - nausea / GERD Promethazine  - nausea Protein supplement - nutrition support Sertraline  - mood Melatonin - sleep     Type of Medication Issue Identified Description of Issue Recommendation(s)  Drug Interaction(s) (clinically significant)     Duplicate Therapy     Allergy     No Medication Administration End Date  Noted plan for Fluconazole  400mg  daily through 05/04/24 followed by 200mg  daily indefinitely.   Incorrect Dose     Additional Drug Therapy Needed     Significant med changes from prior encounter (inform family/care partners about these prior to discharge). Lovastatin  PTA discontinued during inpatient admission.  No CAD. Please communicate changes to patient / family on discharge.  Other       Clinically significant medication issues were identified that warrant physician communication and completion of prescribed/recommended actions by midnight of the next day:  No  Name of provider notified for urgent issues identified:   Provider Method of Notification:     Pharmacist comments:   Time spent performing this drug regimen  review (minutes):  30   Trixie Maclaren, Pharm.D., BCPS Clinical Pharmacist Clinical phone for 03/11/2024 from 7:30-3:00 is [phone].  **Pharmacist phone directory can be found on [email or web address] listed under [facility].  03/11/2024 9:01 AM

## 2024-03-10 NOTE — Progress Notes (Signed)
" °   03/10/24 1720  Vitals  Temp (!) 97.3 F (36.3 C)  Pulse Rate 88  BP 109/72  SpO2 96 %  O2 Device Room Air  Weight 61 kg  Type of Weight Post-Dialysis  Oxygen Therapy  Patient Activity (if Appropriate) In bed  Pulse Oximetry Type Continuous  Post Treatment  Dialyzer Clearance Clear  Liters Processed 89  Fluid Removed (mL) 2600 mL  Tolerated HD Treatment No (Comment)  Post-Hemodialysis Comments Pt. not feeling well. Pt. had a chest discomfort. Blood returned and gave extra 200cc of PNSS. Hooked to 02 at 4LPM    "

## 2024-03-10 NOTE — Progress Notes (Signed)
 Patient's son called and requested to speak with Dr. Kerrin not a PA. RN made PA aware of request to only speak to Dr. Kerrin.

## 2024-03-10 NOTE — Progress Notes (Signed)
 Mobility Specialist Progress Note:    03/10/24 1000  Mobility  Activity Stood at bedside (x2)  Level of Assistance Moderate assist, patient does 50-74%  Assistive Device Front wheel walker  RUE Weight Bearing Per Provider Order NWB  LUE Weight Bearing Per Provider Order NWB  Activity Response Tolerated well  Mobility Referral Yes  Mobility visit 1 Mobility  Mobility Specialist Start Time (ACUTE ONLY) 0945  Mobility Specialist Stop Time (ACUTE ONLY) 1006  Mobility Specialist Time Calculation (min) (ACUTE ONLY) 21 min   Pt received in bed agreeable to mobility. Required MinA to get to EOB and ModA for STS to maintain sternal precautions. Pt c/o BLE pain as well as mild dizziness, took BP (see below). Performed x2 STS unable to ambulate further d/t fatigue and dizziness. Returned to supine in bed w/ call bell and personal belongings in reach. All needs met. Bed alarm on.  Post Mobility BP 110/100  Thersia Minder Mobility Specialist  Please contact vis Secure Chat or  Rehab Office [phone]

## 2024-03-10 NOTE — Progress Notes (Signed)
 PMR Admission Coordinator Pre-Admission Assessment   Patient: Tracy Baker is an 69 y.o., female MRN: [id number] DOB: 06-07-1955 Height: 5' 4 (162.6 cm) Weight: (S) 61.6 kg (Bed Scale)   Insurance Information HMO:     PPO: yes     PCP:      IPA:      80/20:      OTHER:  PRIMARY: U[employer] Medicare      Policy#: [id number] ; Medicare [id number]     Subscriber: pt      Phone#: [phone] option 3     Fax#: [phone] Pre-Cert#: [id number]. Updated auth for CIR # [id number] from 03/10/24-03/17/24  for admit 03/10/24.  Updates to fax listed above.    Benefits:  Phone #: [phone]     Name: 12/31 Eff. Date: 03/06/24 active    Deduct: none      Out of Pocket Max: $4200    Life Max: none CIR: $395 co pay per day days 1 until 6      SNF: no co pay days 1 until 20; $ 218 co pay per day days 21 until 100 Outpatient: $20 per visit     Co-Pay:  Home Health: 100%      Co-Pay:  DME: 80%     Co-Pay: 20% Providers: in network      [employer] Information Summary for patients in Inpatient Rehabilitation Facilities with attached Privacy Act Statement-Health Care Records was provided and verbally reviewed with: Patient and Family   Emergency Contact Information Contact Information       Name Relation Home Work Mobile    Kamphuis,[name] Daughter [phone]   [phone]    Keiasia, Christianson     [phone]    Austin,Brandi Daughter     [phone]         Other Contacts   None on File      Current Medical History  Patient Admitting Diagnosis: debility s/p valve replacement and AKI   History of Present Illness: 69 yo female with history of nephrolithiasis otherwise healthy presents on 12/28/23 by private vehicle after leaving [facility] in [place], [name] where she was being treated for C. Albicans candidemia. Prior to that had underwent ureteral stent placement and lithotripsy at [facility] 12/14/23, then stent removed 10/14. She developed fever, chills and malaise. Went to [place]  for a wedding.    Admitted with candidemia with mitral valve endocarditis. Underwent mitral valve repair on 01/10/24. Placed on micafungin  for ID for a treatment of 6 weeks. Heart failure team consulted for aggressive diuresis. Postop course complicated with post cardiotomy shock, junctional brady cardia , and afib with RVR.    Postop course complicated with severe mitral valve regurgitation  and underwent redo sternotomy and mitral valve replacement on 02/08/24. 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 Initially on CRRT and began IHD on 02/16/24. Extubated on 12/15.  Currently on MWF schedule. Left IJ TDC placed on 12/22 with IR. Remains on antifungal therapy. Managing volume with hemodialysis.. On oral amiodarone  and eliquis  for prior atrial fibrillation. Dobutamine  weaned off.      Protein -calorie malnutrition. On tube feeds. Remeron  to stimulate appetite. Does not want PEG.    Patient's medical record from [facility] has been reviewed by the rehabilitation admission coordinator and physician.   Past Medical History      Past Medical History:  Diagnosis Date   Medical history non-contributory     Thyroid  disease     Vertigo  Has the patient had major surgery during 100 days prior to admission? Yes   Family History   family history includes Diabetes in her father; Hypertension in her mother.   Current Medications [Current Medications]  [Current Medications]    Current Facility-Administered Medications:    acetaminophen  (TYLENOL ) tablet 1,000 mg, 1,000 mg, Oral, Q6H PRN, Raguel Benders S, PA-C   albuterol  (PROVENTIL ) (2.5 MG/3ML) 0.083% nebulizer solution 2.5 mg, 2.5 mg, Nebulization, Q4H PRN, Su, Benders RAMAN, MD, 2.5 mg at 02/13/24 1053   alteplase  (CATHFLO ACTIVASE ) injection 2 mg, 2 mg, Intracatheter, Once PRN, Su, Benders RAMAN, MD   amiodarone  (PACERONE ) tablet 200 mg, 200 mg, Oral, Daily, Raguel Benders S, PA-C, 200 mg at 03/07/24 1558    anticoagulant sodium citrate  solution 5 mL, 5 mL, Intracatheter, PRN, Su, Benders RAMAN, MD   antiseptic oral rinse (BIOTENE) solution 15 mL, 15 mL, Mouth Rinse, PRN, Cleatus Delayne GAILS, MD, 15 mL at 02/29/24 1408   apixaban  (ELIQUIS ) tablet 5 mg, 5 mg, Oral, BID, Sherryll Suzen SQUIBB, RPH, 5 mg at 03/06/24 2132   clonazePAM  (KLONOPIN ) tablet 0.5 mg, 0.5 mg, Oral, QHS, Chambers, Bailey S, PA-C, 0.5 mg at 03/06/24 2132   feeding supplement (ENSURE PLUS HIGH PROTEIN) liquid 237 mL, 237 mL, Oral, TID BM, Su, Bailey S, MD, 237 mL at 03/06/24 2000   feeding supplement (PROSource TF20) liquid 60 mL, 60 mL, Per Tube, BID, Kerrin Elspeth BROCKS, MD, 60 mL at 03/06/24 2132   feeding supplement (VITAL 1.5 CAL) liquid 1,000 mL, 1,000 mL, Per Tube, Q24H, Kerrin Elspeth BROCKS, MD, 1,000 mL at 03/06/24 1815   fluconazole  (DIFLUCAN ) IVPB 400 mg, 400 mg, Intravenous, Q24H, Kerrin Elspeth BROCKS, MD, Last Rate: 100 mL/hr at 03/06/24 1809, 400 mg at 03/06/24 1809   Gerhardt's butt cream, , Topical, BID, Daniel Benders RAMAN, MD, Given at 03/06/24 0954   heparin  injection 1,000 Units, 1,000 Units, Intracatheter, PRN, Daniel Benders RAMAN, MD, 1,000 Units at 03/07/24 1325   insulin  aspart (novoLOG ) injection 0-9 Units, 0-9 Units, Subcutaneous, Q4H, Daniel Benders RAMAN, MD, 2 Units at 03/07/24 0457   insulin  glargine (LANTUS ) injection 15 Units, 15 Units, Subcutaneous, Daily, Su, Bailey S, MD, 15 Units at 03/07/24 1558   lidocaine  (PF) (XYLOCAINE ) 1 % injection 5 mL, 5 mL, Intradermal, PRN, Su, Benders RAMAN, MD   lidocaine -prilocaine  (EMLA ) cream 1 Application, 1 Application, Topical, PRN, Su, Benders RAMAN, MD   magic mouthwash, 5 mL, Oral, TID PRN, Raguel Benders S, PA-C   megestrol  (MEGACE ) tablet 40 mg, 40 mg, Oral, BID, Hendrickson, Steven C, MD, 40 mg at 03/06/24 2133   mirtazapine  (REMERON ) tablet 30 mg, 30 mg, Oral, QHS, Raguel Benders RAMAN, PA-C, 30 mg at 03/06/24 2132   Oral care mouth rinse, 15 mL, Mouth Rinse, 4 times per day, Su, Bailey S, MD, 15  mL at 03/07/24 1559   Oral care mouth rinse, 15 mL, Mouth Rinse, PRN, Su, Benders RAMAN, MD   pantoprazole  (PROTONIX ) EC tablet 40 mg, 40 mg, Oral, Daily, Su, Bailey S, MD, 40 mg at 03/07/24 1559   pentafluoroprop-tetrafluoroeth (GEBAUERS) aerosol 1 Application, 1 Application, Topical, PRN, Daniel Benders RAMAN, MD   polyethylene glycol (MIRALAX  / GLYCOLAX ) packet 17 g, 17 g, Per Tube, Daily PRN, Su, Benders RAMAN, MD   promethazine  (PHENERGAN ) 6.25 mg/NS 50 mL IVPB, 6.25 mg, Intravenous, Q6H PRN, Daniel Benders RAMAN, MD, Stopped at 02/28/24 2315   QUEtiapine  (SEROQUEL ) tablet 25 mg, 25 mg, Oral, QHS, Chambers, Bailey S, PA-C, 25 mg  at 03/06/24 2131   sertraline  (ZOLOFT ) tablet 25 mg, 25 mg, Oral, QHS, Raguel Con RAMAN, PA-C, 25 mg at 03/06/24 2131   simethicone  (MYLICON) chewable tablet 80 mg, 80 mg, Oral, QID PRN, Raguel Con RAMAN, PA-C   sodium chloride  flush (NS) 0.9 % injection 10-40 mL, 10-40 mL, Intracatheter, Q12H, Su, Bailey S, MD, 10 mL at 03/07/24 0800   sodium chloride  flush (NS) 0.9 % injection 10-40 mL, 10-40 mL, Intracatheter, PRN, Su, Con RAMAN, MD    Patients Current Diet:  Diet Order                  Diet regular Room service appropriate? Yes; Fluid consistency: Thin  Diet effective now                       Precautions / Restrictions Precautions Precautions: Fall, Sternal Precaution Booklet Issued: No Precaution/Restrictions Comments: cortak Restrictions Weight Bearing Restrictions Per Provider Order: No RUE Weight Bearing Per Provider Order: Non weight bearing RUE Partial Weight Bearing Percentage or Pounds: 5 LUE Weight Bearing Per Provider Order: Non weight bearing LUE Partial Weight Bearing Percentage or Pounds: 5 Other Position/Activity Restrictions: Cardiac sternal precautions    Has the patient had 2 or more falls or a fall with injury in the past year? No   Prior Activity Level Community (5-7x/wk): Independent, working as conservation officer, nature, driving   Prior Functional Level Self  Care: Did the patient need help bathing, dressing, using the toilet or eating? Independent   Indoor Mobility: Did the patient need assistance with walking from room to room (with or without device)? Independent   Stairs: Did the patient need assistance with internal or external stairs (with or without device)? Independent   Functional Cognition: Did the patient need help planning regular tasks such as shopping or remembering to take medications? Independent   Patient Information Are you of Hispanic, Latino/a,or Spanish origin?: A. No, not of Hispanic, Latino/a, or Spanish origin What is your race?: A. White Do you need or want an interpreter to communicate with a doctor or health care staff?: 0. No   Patient's Response To:  Health Literacy and Transportation Is the patient able to respond to health literacy and transportation needs?: Yes Health Literacy - How often do you need to have someone help you when you read instructions, pamphlets, or other written material from your doctor or pharmacy?: Never In the past 12 months, has lack of transportation kept you from medical appointments or from getting medications?: No In the past 12 months, has lack of transportation kept you from meetings, work, or from getting things needed for daily living?: No   Home Assistive Devices / Equipment Home Equipment: None   Prior Device Use: Indicate devices/aids used by the patient prior to current illness, exacerbation or injury? None of the above   Current Functional Level Cognition   Orientation Level: Oriented X4    Extremity Assessment (includes Sensation/Coordination)   Upper Extremity Assessment: Generalized weakness  Lower Extremity Assessment: Defer to PT evaluation RLE Deficits / Details: no active movement; did respond/withdraw to noxious stimuli; no clonus noted; ROM limited by pitting edema throughout LLE Deficits / Details: spontaneous slight knee extension movement noted; did  respond/withdraw to noxious stimuli; no clonus noted; ROM limited by pitting edema throughout     ADLs   Overall ADL's : Needs assistance/impaired Eating/Feeding: Minimal assistance, Sitting Eating/Feeding Details (indicate cue type and reason): ice chips Grooming: Contact guard assist, Standing Grooming Details (  indicate cue type and reason): standing at sink Upper Body Bathing: Maximal assistance Upper Body Bathing Details (indicate cue type and reason): for back Lower Body Bathing: Total assistance, Bed level Lower Body Bathing Details (indicate cue type and reason): Cues for adhering to precautions Upper Body Dressing : Total assistance, Bed level Upper Body Dressing Details (indicate cue type and reason): verbally reviewed technique for UB dressing Lower Body Dressing: Moderate assistance Lower Body Dressing Details (indicate cue type and reason): mod A to hold BLE in figure 4 Toilet Transfer: Minimal assistance, +2 for physical assistance, Ambulation, Rolling walker (2 wheels) Toilet Transfer Details (indicate cue type and reason): with close chair follow Toileting- Clothing Manipulation and Hygiene: Total assistance, +2 for physical assistance, +2 for safety/equipment, Sit to/from stand Toileting - Clothing Manipulation Details (indicate cue type and reason): Total +2 assist Functional mobility during ADLs: Minimal assistance, +2 for physical assistance, Rolling walker (2 wheels) General ADL Comments: dependent at this time     Mobility   Overal bed mobility: Needs Assistance Bed Mobility: Rolling, Sidelying to Sit, Sit to Sidelying Rolling: Min assist Sidelying to sit: Mod assist Supine to sit: Total assist, +2 for physical assistance, +2 for safety/equipment, HOB elevated Sit to supine: Total assist, +2 for physical assistance Sit to sidelying: Min assist General bed mobility comments: with increased time and tactile cues, pt able to complete more movement with LE, dependent  on assist at trunk     Transfers   Overall transfer level: Needs assistance Equipment used: Rolling walker (2 wheels) Transfers: Sit to/from Stand, Bed to chair/wheelchair/BSC Sit to Stand: Min assist, +2 physical assistance Bed to/from chair/wheelchair/BSC transfer type:: Step pivot Stand pivot transfers: Max assist, +2 safety/equipment Step pivot transfers: Contact guard assist General transfer comment: completed x4 in session with minA to rise, improved power up with hands on knees. max cues for sequencing sternal precautions     Ambulation / Gait / Stairs / Wheelchair Mobility   Ambulation/Gait Ambulation/Gait assistance: +2 safety/equipment, Min assist Gait Distance (Feet): 32 Feet (19ft + 16ft + 64ft) Assistive device: Rolling walker (2 wheels) Gait Pattern/deviations: Step-to pattern, Decreased stride length, Drifts right/left, Trunk flexed, Narrow base of support, Scissoring General Gait Details: pt with small steps and dependent on BUE support on walker. assist to guide walker, encourage trunk upright, and stride length. progressively decreased stride length in LLE and more frequent near-scissoring steps with fatigue Gait velocity: decreased Gait velocity interpretation: <1.31 ft/sec, indicative of household ambulator     Posture / Balance Dynamic Sitting Balance Sitting balance - Comments: CGA to minA to maintain sitting balance, mild posterior lean Balance Overall balance assessment: Needs assistance Sitting-balance support: No upper extremity supported, Feet unsupported Sitting balance-Leahy Scale: Good Sitting balance - Comments: CGA to minA to maintain sitting balance, mild posterior lean Postural control: Posterior lean Standing balance support: Bilateral upper extremity supported, During functional activity Standing balance-Leahy Scale: Fair Standing balance comment: can tolerate static stance without UE support, BUE support for gait     Special considerations/life  events  Pressure ulcer right buttocks [place] for Tube feeds New to hemodialysis this admission    Previous Home Environment  Living Arrangements: Alone  Lives With: Alone Available Help at Discharge: Family, Available 24 hours/day (dtr, [name] Jarvis, and son along with Aunt from [place] can provide 24/7) Type of Home: House Home Layout: One level Home Access: Stairs to enter Entrance Stairs-Rails: Left Entrance Stairs-Number of Steps: 5 Bathroom Shower/Tub: Health Visitor: Administrator  Accessibility: Yes How Accessible: Accessible via walker Home Care Services: No   Discharge Living Setting Plans for Discharge Living Setting: Patient's home, House Type of Home at Discharge: House Discharge Home Layout: One level Discharge Home Access: Stairs to enter Entrance Stairs-Rails: Left Entrance Stairs-Number of Steps: 5 Discharge Bathroom Shower/Tub: Walk-in shower Discharge Bathroom Toilet: Standard Discharge Bathroom Accessibility: Yes How Accessible: Accessible via walker Does the patient have any problems obtaining your medications?: No   Social/Family/Support Systems Patient Roles: Parent Contact Information: [name], Daughter , main contact Anticipated Caregiver: [name] Jarvis and son, pt's Sister from [place] to come asisst at discharge Anticipated Caregiver's Contact Information: see contacts Ability/Limitations of Caregiver: children work but will arrange schedules Caregiver Availability: 24/7 Discharge Plan Discussed with Primary Caregiver: Yes Is Caregiver In Agreement with Plan?: Yes Does Caregiver/Family have Issues with Lodging/Transportation while Pt is in Rehab?: No   Goals Patient/Family Goal for Rehab: supervision PT, OT and SLP Expected length of stay: ELOS 10 to 14 days Additional Information: Treatment for fungemia to be po fluconazole  at discharge Pt/Family Agrees to Admission and willing to participate: Yes Program Orientation  Provided & Reviewed with Pt/Caregiver Including Roles  & Responsibilities: Yes  Barriers to Discharge: Insurance for SNF coverage   Decrease burden of Care through IP rehab admission: n/a   Possible need for SNF placement upon discharge: not anticipated   Patient Condition: I have reviewed medical records from [facility], spoken with  patient and daughter. I met with patient at the bedside and discussed via phone for inpatient rehabilitation assessment.  Patient will benefit from ongoing PT, OT, and SLP, can actively participate in 3 hours of therapy a day 5 days of the week, and can make measurable gains during the admission.  Patient will also benefit from the coordinated team approach during an Inpatient Acute Rehabilitation admission.  The patient will receive intensive therapy as well as Rehabilitation physician, nursing, social worker, and care management interventions.  Due to bladder management, bowel management, safety, skin/wound care, disease management, medication administration, pain management, and patient education the patient requires 24 hour a day rehabilitation nursing.  The patient is currently min/mod A with mobility and basic ADLs.  Discharge setting and therapy post discharge at home with home health is anticipated.  Patient has agreed to participate in the Acute Inpatient Rehabilitation Program and will admit today.   Preadmission Screen Completed By:  Alison Heron Lot, RN MSN1/04/2024 5:57 PM, Updates provided by Kinney Sackmann, PT 03/10/24 ______________________________________________________________________   Discussed status with Dr. Babs on 03/10/24 at 10:00 and received approval for admission today.   Admission Coordinator:  Alison Heron Lot, RN MSN, updates provided by Cosmo Platter, PT time 11:00am/Date 03/10/24    Assessment/Plan: Diagnosis: debility Does the need for close, 24 hr/day Medical supervision in concert with the patient's rehab needs  make it unreasonable for this patient to be served in a less intensive setting? Yes Co-Morbidities requiring supervision/potential complications: malnutrition, wound care, fungal esophagitis Due to bladder management, bowel management, safety, skin/wound care, disease management, medication administration, pain management, and patient education, does the patient require 24 hr/day rehab nursing? Yes Does the patient require coordinated care of a physician, rehab nurse, PT, OT, and SLP to address physical and functional deficits in the context of the above medical diagnosis(es)? Yes Addressing deficits in the following areas: balance, endurance, locomotion, strength, transferring, bowel/bladder control, bathing, dressing, feeding, grooming, toileting, and psychosocial support Can the patient actively participate in an intensive therapy program  of at least 3 hrs of therapy 5 days a week? Yes The potential for patient to make measurable gains while on inpatient rehab is excellent Anticipated functional outcomes upon discharge from inpatient rehab: supervision PT, supervision OT, supervision SLP Estimated rehab length of stay to reach the above functional goals is: 10-14 day Anticipated discharge destination: Home 10. Overall Rehab/Functional Prognosis: excellent     MD Signature: Arthea IVAR Gunther, MD, [facility] Physical Medicine & Rehabilitation Medical Director Rehabilitation Services 03/10/2024

## 2024-03-10 NOTE — Progress Notes (Signed)
 Physical Medicine and Rehabilitation Consult Reason for Consult:debility after MVR Referring Physician: Kerrin     HPI: Tracy Baker is a 69 y.o. female who was recently hospitalized for Candida albicans septicemia.  Apparently she left AMA from [facility] and presented to [facility] on 1025.  TEE on 1028 showed vegetations on the mitral valve.  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  Patient is on micafungin  per infectious disease with the treatment window of 6 weeks in total.  Patient being followed closely by the heart failure team with aggressive diuresis.  Increased LFTs with improvement on 01/13/2024.   Patient was up with therapies yesterday and was mod assist for sit to stand transfers and max assist for stand pivot.  Gait was not attempted.  Patient was living alone but independence and working prior to this admit.  She has a 1 level house with 5 steps to enter.  Family are available intermittently.       Home: Home Living Family/patient expects to be discharged to:: Private residence Living Arrangements: Alone Available Help at Discharge: Family, Available PRN/intermittently Type of Home: House Home Access: Stairs to enter Secretary/administrator of Steps: 5 Entrance Stairs-Rails: Left Home Layout: One level Bathroom Shower/Tub: Health Visitor: Standard Home Equipment: None  Functional History: Prior Function Prior Level of Function : Independent/Modified Independent, Working/employed, Driving Mobility Comments: working as a Engineering Geologist Status:  Mobility: Bed Mobility Overal bed mobility: Needs Assistance Bed Mobility: Sit to Sidelying, Rolling Rolling: Mod assist Sit to sidelying: Max assist Transfers Overall transfer level: Needs assistance Equipment used: 1 person hand held assist Transfers: Sit to/from Stand, Bed to chair/wheelchair/BSC Sit to Stand: Mod  assist Bed to/from chair/wheelchair/BSC transfer type:: Stand pivot Stand pivot transfers: Max assist   ADL:   Cognition: Cognition Orientation Level: Oriented X4 Cognition Arousal: Alert Behavior During Therapy: Flat affect     Review of Systems  Constitutional:  Positive for malaise/fatigue.  HENT: Negative.    Eyes: Negative.   Respiratory:  Positive for cough and shortness of breath.   Cardiovascular:  Positive for chest pain.  Gastrointestinal: Negative.   Genitourinary: Negative.   Skin: Negative.   Neurological:  Positive for weakness.  Psychiatric/Behavioral: Negative.         Past Medical History:  Diagnosis Date   Thyroid  disease               Past Surgical History:  Procedure Laterality Date   APPENDECTOMY       AUGMENTATION MAMMAPLASTY Bilateral 1981    removed in 2006   BREAST IMPLANT REMOVAL Bilateral 2007   CHOLECYSTECTOMY   2010   COLONOSCOPY WITH PROPOFOL  N/A 06/30/2021    Procedure: COLONOSCOPY WITH PROPOFOL ;  Surgeon: Jinny Carmine, MD;  Location: [facility] ENDOSCOPY;  Service: Endoscopy;  Laterality: N/A;   INTRAOPERATIVE TRANSESOPHAGEAL ECHOCARDIOGRAM N/A 01/10/2024    Procedure: ECHOCARDIOGRAM, TRANSESOPHAGEAL, INTRAOPERATIVE;  Surgeon: Kerrin Elspeth BROCKS, MD;  Location: [facility] OR;  Service: Open Heart Surgery;  Laterality: N/A;   LAPAROSCOPIC TOTAL HYSTERECTOMY   2006   MITRAL VALVE REPAIR N/A 01/10/2024    Procedure: REPAIR, MITRAL VALVE;  Surgeon: Kerrin Elspeth BROCKS, MD;  Location: [facility];  Service: Open Heart Surgery;  Laterality: N/A;   RIGHT/LEFT HEART CATH AND CORONARY ANGIOGRAPHY N/A 01/04/2024    Procedure: RIGHT/LEFT HEART CATH AND CORONARY ANGIOGRAPHY;  Surgeon: Verlin Lonni BIRCH, MD;  Location: [facility] INVASIVE CV LAB;  Service: Cardiovascular;  Laterality:  N/A;   TOOTH EXTRACTION N/A 01/07/2024    Procedure: DENTAL RESTORATION/EXTRACTIONS;  Surgeon: Sheryle Hamilton, DMD;  Location: [facility] OR;  Service: Oral Surgery;  Laterality: N/A;   EXTRACTION OF TEETH #2, #6, #7, #16, #29   TRANSESOPHAGEAL ECHOCARDIOGRAM (CATH LAB) N/A 01/01/2024    Procedure: TRANSESOPHAGEAL ECHOCARDIOGRAM;  Surgeon: Lonni Slain, MD;  Location: [facility] INVASIVE CV LAB;  Service: Cardiovascular;  Laterality: N/A;             Family History  Problem Relation Age of Onset   Hypertension Mother     Diabetes Father     Breast cancer Neg Hx          Social History:  reports that she has been smoking cigarettes. She has never used smokeless tobacco. She reports current alcohol use. She reports that she does not use drugs. Allergies:  Allergies      Allergies  Allergen Reactions   Codeine Anaphylaxis and Nausea And Vomiting   Red Dye #40 (Allura Red) Nausea Only            Medications Prior to Admission  Medication Sig Dispense Refill   acetaminophen  (TYLENOL ) 325 MG tablet Take 650 mg by mouth daily as needed for fever.       clonazePAM  (KLONOPIN ) 0.5 MG tablet Take 0.25-0.5 mg by mouth at bedtime as needed for anxiety.       [EXPIRED] fluconazole  (DIFLUCAN ) 200 MG tablet Take 2 tablets by mouth daily for 3 days. Please present to hospital in [place]  for continued treatment. In the meantime, take 2 tablets by mouth daily for 3 days.       ondansetron  (ZOFRAN ) 4 MG tablet Take 1 tablet (4 mg total) by mouth every 8 (eight) hours as needed for nausea or vomiting. 20 tablet 0   pantoprazole  (PROTONIX ) 20 MG tablet Take 1 tablet (20 mg total) by mouth daily. 30 tablet 2   lovastatin  (MEVACOR ) 20 MG tablet Take 1 tablet (20 mg total) by mouth at bedtime. (Patient not taking: Reported on 12/29/2023) 90 tablet 3            Blood pressure 129/67, pulse 80, temperature 98.9 F (37.2 C), temperature source Oral, resp. rate (!) 22, height 5' 4 (1.626 m), weight 76.7 kg, SpO2 95%. Physical Exam Constitutional:      General: She is not in acute distress.    Appearance: She is obese.  HENT:     Head: Normocephalic.     Nose: Nose  normal.     Comments: O2 via HFNC Eyes:     Pupils: Pupils are equal, round, and reactive to light.  Cardiovascular:     Rate and Rhythm: Normal rate.  Pulmonary:     Effort: Pulmonary effort is normal.  Musculoskeletal:        General: Normal range of motion.     Cervical back: Normal range of motion.  Skin:    General: Skin is warm.  Neurological:     Mental Status: She is alert.     Comments: Pt is alert and oriented x 3. Voice/phonation solid. No focal CN findings. Good insight and awareness. Functional memory. MMT: BUE 4-/5 pecs, deltoids and 4/5 biceps, triceps and 4+ HI. BLE: 2+HF, 3- KE and 4/5 ADF/PF. Sensory exam normal for light touch and pain in all 4 limbs. No limb ataxia or cerebellar signs. No abnormal tone appreciated.    Psychiatric:        Mood and Affect: Mood normal.  Behavior: Behavior normal.       Lab Results Last 24 Hours       Results for orders placed or performed during the hospital encounter of 12/28/23 (from the past 24 hours)  Glucose, capillary     Status: Abnormal    Collection Time: 01/13/24  3:57 PM  Result Value Ref Range    Glucose-Capillary 175 (H) 70 - 99 mg/dL  Glucose, capillary     Status: Abnormal    Collection Time: 01/13/24  9:07 PM  Result Value Ref Range    Glucose-Capillary 144 (H) 70 - 99 mg/dL  Comprehensive metabolic panel     Status: Abnormal    Collection Time: 01/14/24  4:24 AM  Result Value Ref Range    Sodium 144 135 - 145 mmol/L    Potassium 3.1 (L) 3.5 - 5.1 mmol/L    Chloride 108 98 - 111 mmol/L    CO2 27 22 - 32 mmol/L    Glucose, Bld 146 (H) 70 - 99 mg/dL    BUN 35 (H) 8 - 23 mg/dL    Creatinine, Ser 8.77 (H) 0.44 - 1.00 mg/dL    Calcium  8.1 (L) 8.9 - 10.3 mg/dL    Total Protein 5.3 (L) 6.5 - 8.1 g/dL    Albumin  2.8 (L) 3.5 - 5.0 g/dL    AST 601 (H) 15 - 41 U/L    ALT 286 (H) 0 - 44 U/L    Alkaline Phosphatase 180 (H) 38 - 126 U/L    Total Bilirubin 2.4 (H) 0.0 - 1.2 mg/dL    GFR, Estimated 48 (L) >60  mL/min    Anion gap 9 5 - 15  CBC     Status: Abnormal    Collection Time: 01/14/24  4:24 AM  Result Value Ref Range    WBC 8.6 4.0 - 10.5 K/uL    RBC 3.59 (L) 3.87 - 5.11 MIL/uL    Hemoglobin 10.9 (L) 12.0 - 15.0 g/dL    HCT 67.5 (L) 63.9 - 46.0 %    MCV 90.3 80.0 - 100.0 fL    MCH 30.4 26.0 - 34.0 pg    MCHC 33.6 30.0 - 36.0 g/dL    RDW 82.8 (H) 88.4 - 15.5 %    Platelets 66 (L) 150 - 400 K/uL    nRBC 2.0 (H) 0.0 - 0.2 %  Magnesium      Status: None    Collection Time: 01/14/24  4:24 AM  Result Value Ref Range    Magnesium  1.9 1.7 - 2.4 mg/dL  Cooxemetry Panel (carboxy, met, total hgb, O2 sat)     Status: Abnormal    Collection Time: 01/14/24  5:23 AM  Result Value Ref Range    Total hemoglobin 11.4 (L) 12.0 - 16.0 g/dL    O2 Saturation 56.3 %    Carboxyhemoglobin 0.5 0.5 - 1.5 %    Methemoglobin <0.7 0.0 - 1.5 %  Glucose, capillary     Status: Abnormal    Collection Time: 01/14/24  6:49 AM  Result Value Ref Range    Glucose-Capillary 147 (H) 70 - 99 mg/dL  Cooxemetry Panel (carboxy, met, total hgb, O2 sat)     Status: Abnormal    Collection Time: 01/14/24  9:38 AM  Result Value Ref Range    Total hemoglobin 11.5 (L) 12.0 - 16.0 g/dL    O2 Saturation 48.0 %    Carboxyhemoglobin 2.0 (H) 0.5 - 1.5 %    Methemoglobin <0.7 0.0 - 1.5 %  Glucose, capillary     Status: Abnormal    Collection Time: 01/14/24 11:11 AM  Result Value Ref Range    Glucose-Capillary 170 (H) 70 - 99 mg/dL      Imaging Results (Last 48 hours)  DG Chest Port 1 View Result Date: 01/14/2024 CLINICAL DATA:  Pulmonary edema. EXAM: PORTABLE CHEST 1 VIEW COMPARISON:  01/13/2024 FINDINGS: The cardio pericardial silhouette is enlarged. Diffuse interstitial and basilar airspace disease has improved slightly in the interval. Small bilateral pleural effusions again noted. No acute bony abnormality. Right IJ sheath remains in place. Telemetry leads overlie the chest. IMPRESSION: 1. Interval improvement in diffuse  interstitial and basilar airspace disease. 2. Small bilateral pleural effusions. Electronically Signed   By: Camellia Candle M.D.   On: 01/14/2024 06:42    DG Chest Port 1 View Result Date: 01/13/2024 EXAM: 1 VIEW(S) XRAY OF THE CHEST 01/13/2024 05:40:00 AM COMPARISON: 01/12/2024. CLINICAL HISTORY: 393515 S/P MVR (mitral valve repair) FINDINGS: LINES, TUBES AND DEVICES: Interval removal of Swan-Ganz catheter. Right IJ sheath remains in place with tip at the mid SVC. LUNGS AND PLEURA: Increased moderate pulmonary edema, most pronounced at the mid to lower lung zones. Increased size of small bilateral pleural effusions with bibasilar opacities, favor to reflect atelectasis. No pneumothorax. HEART AND MEDIASTINUM: Mild cardiomegaly. Median sternotomy. BONES AND SOFT TISSUES: No acute abnormality. IMPRESSION: 1. Interval removal of Swan-Ganz catheter. Right IJ sheath remains in place with tip at the mid SVC. 2. Increased moderate pulmonary edema, most pronounced at the mid to lower lung zones. 3. Increased size of small bilateral pleural effusions with bibasilar opacities, favor to reflect atelectasis. Electronically signed by: Shahmeer Marcelino MD 01/13/2024 09:58 AM EST RP Workstation: [id number]    ECHOCARDIOGRAM COMPLETE Result Date: 01/12/2024    ECHOCARDIOGRAM REPORT   Patient Name:   Tracy Baker Date of Exam: 01/12/2024 Medical Rec #:  [id number]      Height:       64.0 in Accession #:    [id number]     Weight:       177.0 lb Date of Birth:  Jul 17, 1955      BSA:          1.858 m Patient Age:    68 years       BP:           121/73 mmHg Patient Gender: F              HR:           83 bpm. Exam Location:  Inpatient Procedure: 2D Echo, Color Doppler, Cardiac Doppler and Intracardiac            Opacification Agent (Both Spectral and Color Flow Doppler were            utilized during procedure). Indications:    Mitral Valve Disorder I05.9  History:        Patient has prior history of Echocardiogram examinations,  most                 recent 01/10/2024. Mitral Valve Disease; Risk Factors:Thyroid                  Disease.  Sonographer:    Logan Shove RDCS Referring Phys: [phone] EZRA RAMAN [facility]  Sonographer Comments: Image acquisition challenging due to breast implants. IMPRESSIONS  1. Left ventricular ejection fraction, by estimation, is 65 to 70%. Left ventricular ejection fraction by PLAX is 69 %. The left ventricle has normal function. The  left ventricle has no regional wall motion abnormalities. Left ventricular diastolic parameters are indeterminate.  2. Right ventricular systolic function is mildly reduced. The right ventricular size is normal. Tricuspid regurgitation signal is inadequate for assessing PA pressure.  3. Left atrial size was mildly dilated.  4. The mitral valve has been repaired/replaced. Mild mitral valve regurgitation. Mild mitral stenosis. The mean mitral valve gradient is 4.0 mmHg. Procedure Date: 01/10/2024.  5. The aortic valve is tricuspid. Aortic valve regurgitation is not visualized. Aortic valve sclerosis is present, with no evidence of aortic valve stenosis.  6. The inferior vena cava is dilated in size with <50% respiratory variability, suggesting right atrial pressure of 15 mmHg. Comparison(s): A prior study was performed on 01/01/2024. TEE which showed a small mobile echodensity on the anterior mitral leaflet consistent with a vegetation. Agitated saline was positive for an interatrial shunt. FINDINGS  Left Ventricle: Left ventricular ejection fraction, by estimation, is 65 to 70%. Left ventricular ejection fraction by PLAX is 69 %. The left ventricle has normal function. The left ventricle has no regional wall motion abnormalities. Definity  contrast agent was given IV to delineate the left ventricular endocardial borders. The left ventricular internal cavity size was normal in size. There is no left ventricular hypertrophy. Left ventricular diastolic function could not be evaluated due to mitral  valve repair. Left ventricular diastolic parameters are indeterminate. Right Ventricle: The right ventricular size is normal. No increase in right ventricular wall thickness. Right ventricular systolic function is mildly reduced. Tricuspid regurgitation signal is inadequate for assessing PA pressure. Left Atrium: Left atrial size was mildly dilated. Right Atrium: Right atrial size was normal in size. Pericardium: There is no evidence of pericardial effusion. Mitral Valve: The mitral valve has been repaired/replaced. Mild mitral valve regurgitation. Mild mitral valve stenosis. MV peak gradient, 9.9 mmHg. The mean mitral valve gradient is 4.0 mmHg with average heart rate of 85 bpm. Tricuspid Valve: The tricuspid valve is not well visualized. Tricuspid valve regurgitation is trivial. Aortic Valve: The aortic valve is tricuspid. Aortic valve regurgitation is not visualized. Aortic valve sclerosis is present, with no evidence of aortic valve stenosis. Aortic valve peak gradient measures 5.3 mmHg. Pulmonic Valve: The pulmonic valve was not well visualized. Pulmonic valve regurgitation is not visualized. Aorta: The aortic root and ascending aorta are structurally normal, with no evidence of dilitation. Venous: The inferior vena cava is dilated in size with less than 50% respiratory variability, suggesting right atrial pressure of 15 mmHg. IAS/Shunts: The interatrial septum was not well visualized.  LEFT VENTRICLE PLAX 2D LV EF:         Left            Diastology                ventricular     LV e' medial:    4.03 cm/s                ejection        LV E/e' medial:  35.7                fraction by     LV e' lateral:   5.66 cm/s                PLAX is 69      LV E/e' lateral: 25.4                %. LVIDd:         4.20  cm LVIDs:         2.60 cm LV PW:         0.90 cm LV IVS:        0.80 cm LVOT diam:     2.00 cm LVOT Area:     3.14 cm LV IVRT:       222 msec  LV Volumes (MOD) LV vol d, MOD    91.5 ml A2C: LV vol d, MOD     73.2 ml A4C: LV vol s, MOD    26.4 ml A2C: LV vol s, MOD    21.8 ml A4C: LV SV MOD A2C:   65.1 ml LV SV MOD A4C:   73.2 ml LV SV MOD BP:    57.4 ml RIGHT VENTRICLE            IVC RV Basal diam:  2.90 cm    IVC diam: 2.10 cm RV S prime:     7.83 cm/s TAPSE (M-mode): 1.0 cm     PULMONARY VEINS                            Diastolic Velocity: 57.10 cm/s                            S/D Velocity:       1.10                            Systolic Velocity:  63.90 cm/s LEFT ATRIUM             Index        RIGHT ATRIUM           Index LA diam:        3.00 cm 1.62 cm/m   RA Area:     12.60 cm LA Vol (A2C):   35.5 ml 19.11 ml/m  RA Volume:   30.20 ml  16.26 ml/m LA Vol (A4C):   49.6 ml 26.70 ml/m LA Biplane Vol: 44.0 ml 23.69 ml/m  AORTIC VALVE AV Area (Vmax): 2.90 cm AV Vmax:        115.00 cm/s AV Peak Grad:   5.3 mmHg LVOT Vmax:      106.00 cm/s  AORTA Ao Root diam: 2.60 cm Ao Asc diam:  2.40 cm MITRAL VALVE MV Area (PHT): 3.99 cm     SHUNTS MV Peak grad:  9.9 mmHg     Systemic Diam: 2.00 cm MV Mean grad:  4.0 mmHg MV Vmax:       1.58 m/s MV Vmean:      89.6 cm/s MV Decel Time: 190 msec MV E velocity: 144.00 cm/s MV A velocity: 99.30 cm/s MV E/A ratio:  1.45 Emeline Calender Electronically signed by Emeline Calender Signature Date/Time: 01/12/2024/3:33:39 PM    Final     US  Abdomen Limited RUQ (LIVER/GB) Result Date: 01/12/2024 EXAM: Right Upper Quadrant Abdominal Ultrasound 01/12/2024 12:57:00 PM TECHNIQUE: Real-time ultrasonography of the right upper quadrant of the abdomen was performed. The exam is limited due to bandages from recent heart surgery and rib shadowing. COMPARISON: Ultrasound 10 / 26 / 25. CLINICAL HISTORY: [id number] Elevated LFTs 221910 Elevated LFTs FINDINGS: LIVER: The liver demonstrates normal echogenicity. Patent portal vein with antegrade flow. No intrahepatic biliary ductal dilatation. No evidence of mass. BILIARY SYSTEM: Status post cholecystectomy. Common bile duct measures 5 mm. No  intrahepatic biliary  ductal dilatation. OTHER: No right upper quadrant ascites. IMPRESSION: 1. Cholecystectomy. No biliary dilation. 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 Yes Co-Morbidities requiring supervision/potential complications:  - ID considerations -Transaminitis -Volume management Due to bladder management, bowel management, safety, skin/wound care, disease management, medication administration, pain management, and patient education, does the patient require 24 hr/day rehab nursing? Yes Does the patient require coordinated care of a physician, rehab nurse, therapy disciplines of PT, OT to address physical and functional deficits in the context of the above medical diagnosis(es)? Yes Addressing deficits in the following areas: balance, endurance, locomotion, strength, transferring, bowel/bladder control, bathing, dressing, feeding, grooming, toileting, and psychosocial support Can the patient actively participate in an intensive therapy program of at least 3 hrs of therapy per day at least 5 days per week? Yes The potential for patient to make measurable gains while on inpatient rehab is excellent Anticipated functional outcomes upon discharge from inpatient rehab are modified independent and supervision  with PT, modified independent and supervision with OT, n/a with SLP. Estimated rehab length of stay to reach the above functional goals is: 7-9 days Anticipated discharge destination: Home Overall Rehab/Functional Prognosis: excellent   POST ACUTE RECOMMENDATIONS: This patient's condition is appropriate for  continued rehabilitative care in the following setting: CIR Patient has agreed to participate in recommended program. Yes Note that insurance prior authorization may be required for reimbursement for recommended care.   Comment: Rehab Admissions Coordinator to follow up.  Pt was independent prior to this health set-back. She is anxious to regain her independence and do what she needs to do to get home. There are  family, it appears, who can provide some assistance.            I have personally performed a face to face diagnostic evaluation of this patient. Additionally, I have examined the patient's medical record including any pertinent labs and radiographic images.     Thanks,   Arthea ONEIDA Gunther, MD 01/14/2024

## 2024-03-10 NOTE — Progress Notes (Signed)
 30 Days Post-Op Procedures (LRB): ECMO CANNULATION (N/A) Subjective: Feels ok, says she is eating better, without nausea  Objective: Vital signs in last 24 hours: Temp:  [97.9 F (36.6 C)-98.3 F (36.8 C)] 98.3 F (36.8 C) (01/05 0459) Pulse Rate:  [70-74] 71 (01/05 0459) Cardiac Rhythm: Normal sinus rhythm;Bundle branch block;Heart block (01/04 1900) Resp:  [12-17] 14 (01/05 0459) BP: (96-109)/(58-66) 96/60 (01/05 0459) SpO2:  [95 %-97 %] 96 % (01/05 0459) Weight:  [62.7 kg] 62.7 kg (01/05 0459)  Hemodynamic parameters for last 24 hours:    Intake/Output from previous day: 01/04 0701 - 01/05 0700 In: 818.9 [NG/GT:618.9; IV Piggyback:200] Out: 500 [Urine:500] Intake/Output this shift: No intake/output data recorded.  General appearance: alert, cooperative, and no distress Heart: regular rate and rhythm Lungs: clear to auscultation bilaterally Abdomen: benign Extremities: LE's wrapped Wound: incis healing well  Lab Results: Recent Labs    03/07/24 0730  WBC 6.0  HGB 10.9*  HCT 32.1*  PLT 155   BMET:  Recent Labs    03/08/24 0500 03/09/24 0435  NA 132* 133*  K 4.1 4.2  CL 91* 92*  CO2 25 23  GLUCOSE 185* 162*  BUN 71* 108*  CREATININE 2.80* 3.51*  CALCIUM  9.6 9.7    PT/INR: No results for input(s): LABPROT, INR in the last 72 hours. ABG    Component Value Date/Time   PHART 7.340 (L) 02/13/2024 1618   HCO3 18.0 (L) 02/13/2024 1618   TCO2 19 (L) 02/13/2024 1618   ACIDBASEDEF 7.0 (H) 02/13/2024 1618   O2SAT 62 03/07/2024 0503   CBG (last 3)  Recent Labs    03/09/24 1939 03/09/24 2340 03/10/24 0458  GLUCAP 163* 142* 175*    Meds Scheduled Meds:  amiodarone   200 mg Oral Daily   apixaban   5 mg Oral BID   clonazePAM   0.5 mg Oral QHS   feeding supplement  237 mL Oral TID BM   feeding supplement (PROSource TF20)  60 mL Per Tube BID   feeding supplement (VITAL 1.5 CAL)  1,000 mL Per Tube Q24H   Gerhardt's butt cream   Topical BID   insulin   aspart  0-9 Units Subcutaneous Q4H   megestrol   80 mg Oral BID   mirtazapine   30 mg Oral QHS   mouth rinse  15 mL Mouth Rinse 4 times per day   pantoprazole   40 mg Oral Daily   QUEtiapine   25 mg Oral QHS   sertraline   25 mg Oral QHS   sodium chloride  flush  10-40 mL Intracatheter Q12H   Continuous Infusions:  anticoagulant sodium citrate      fluconazole  (DIFLUCAN ) IV 400 mg (03/09/24 1739)   promethazine  (PHENERGAN ) injection (IM or IVPB) Stopped (02/28/24 2315)   PRN Meds:.acetaminophen , albuterol , alteplase , anticoagulant sodium citrate , antiseptic oral rinse, heparin , lidocaine  (PF), lidocaine -prilocaine , magic mouthwash, mouth rinse, pentafluoroprop-tetrafluoroeth, polyethylene glycol, promethazine  (PHENERGAN ) injection (IM or IVPB), simethicone , sodium chloride  flush  Xrays No results found.  Assessment/Plan: S/P Procedures (LRB): ECMO CANNULATION (N/A)  1 afeb, VSS, in NSR, 1 deg AVB currently, has had afib post op- on amio and apixaban  2 O2 sats good on RA 3 voiding amts improving some, no improvement in GFR, remains on HD MWF- renal issues as per nephrology management 4 BS control is good - lantus  has been stopped 5 no new labs 6 working on placement - hopefully CIR, conts therapies 7 push nutrition as able, nite time TF's 8 ID- Diflucan  as outlined   LOS: 68 days  Lemond FORBES Cera PA-C Pager [phone]  03/10/2024

## 2024-03-10 NOTE — Plan of Care (Signed)
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  Measurement: 2 cm x 1.2 cm x 0.3 cm  Wound bed: pink and moist  Drainage (amount, consistency, odor) minimal weeping Periwound: area at risk, area was larger with some resolved deep tissue injury noted .  IS on dialysis three times weekly, microshifts needed while in recliner or bed for long periods of time.  Offload heel pressure with prevalon boots.    Dressing procedure/placement/frequency: Continue Xeroform and foam.  Turn and reposition.  Reason for Consult: L heel  Wound type: thick hyperkeratotic skin, no open wound noted  Pressure Injury POA: NA  Measurement: no wound  Wound bed: thick hyperkeratotic skin as above  Drainage (amount, consistency, odor) dry  Periwound: mild erythema and scattered areas of dry skin  Dressing procedure/placement/frequency: Patient is wearing unna boots changed on Mondays and Thursday;  Will add to place a piece of Xeroform to L heel prior to placement of unna boots these days   Cleanse L heel with soap and water  after removal of unna boot. Apply a piece of Xeroform gauze ([employer] #[id number]) to L heel prior to placement of new unna boot.  Perform Monday and Thursday with change of unna boot.       Interventions:  Gerhardt's cream to buttocks Soil Scientist for wheelchair Megace /Remeron  Ensure TID ProSource daily   Braden Score: 15 Sensory: 3 Moisture: 3 Activity: 3 Mobility: 3 Nutrition: 2 Friction: 1  Contributors: Darice Cooley MSN, RN, FNP-BC CWON Wound, Ostomy, Continence Nurse [employer] MSN, RN-BC, CWOCN  Tinnie Minus, MSN, RNC-OB Diabetes Coordinator Josette Glance, MS, RDN, LDN Clinical Dietitian I Barnie Ronde, RN-BC, [facility] Care Coordinator "

## 2024-03-10 NOTE — TOC Transition Note (Signed)
 Transition of Care [facility]) - Discharge Note   Patient Details  Name: Tracy Baker MRN: [id number] Date of Birth: 07/16/1955  [facility] CM/SW Contact:  Roxie KANDICE Stain, RN Phone Number: 03/10/2024, 12:51 PM   Clinical Narrative:    Patient stable to transfer for to CIR. ICM will sign off.   Final next level of care: IP Rehab Facility Barriers to Discharge: Continued Medical Work up   Patient Goals and CMS Choice Patient states their goals for this hospitalization and ongoing recovery are:: wants to get better CMS Medicare.gov Compare Post Acute Care list provided to:: Patient Choice offered to / list presented to : Patient      Discharge Placement                       Discharge Plan and Services Additional resources added to the After Visit Summary for     Discharge Planning Services: CM Consult Post Acute Care Choice: Skilled Nursing Facility                               Social Drivers of Health (SDOH) Interventions SDOH Screenings   Food Insecurity: No Food Insecurity (12/29/2023)  Housing: Low Risk (12/29/2023)  Transportation Needs: No Transportation Needs (12/29/2023)  Utilities: Not At Risk (12/29/2023)  Alcohol Screen: Low Risk (06/08/2022)  Depression (PHQ2-9): Low Risk (11/08/2023)  Social Connections: Socially Isolated (12/29/2023)  Tobacco Use: High Risk (02/23/2024)     Readmission Risk Interventions     No data to display

## 2024-03-10 NOTE — H&P (Signed)
 Physical Medicine and Rehabilitation Admission H&P        Chief Complaint  Patient presents with   Debility.       HPI:  Tracy Baker is a 69 year old female with history of thyroid  dx, anxiety, nephrolithiasis s/p lithotripsy w/ ureteral stent at [facility] 12/14/23 which were removed on 10/14 as patient going OOT to attend wedding in [facility]. While in [place], [name], she developed pyelopheritis with candida bacteremia due to UTI treated with IV fluconazole  at [facility]. She signed out AMA to return for treatment closer to home and presented to [facility] 12/29/23.  Dr. Fleeta Rothman consulted  for input on persistent candemiaa as repeat [facility] 10/20 still positive for candida albicans and recommended change to miafungin, repeat TTE as well as ophthalmology consult. Opth consult by Dr. Mike Fleeta was negative for fugal endopthalmitis. TEE showed very small MV vegetation and Dr. Kerrin recommended assessment of coronaries as well as dental evaluation prior to surgery.   She underwent cardiac cath by Dr. Verlin 10/31 showed no evidence of CAD and normal left/right heart pressures.  She underwent  extraction of multiple non-restorable teeth due to dental caries by Dr. Sheryle and underwent repair of mitral valve on 01/10/24 with post cardiotomy shock with A fib managed with amiodarone  gtt as well as fevers and treated with broad spectrum antibiotics due to concerns of aspiration PNA. Heart failure team consulted to manage overload and required Milrinone , Epi and Norepi.     She developed  epistaxis in setting of ASA and Eliquis  on 11/17 requiring d/c of ASA but had recurrent episode 12/01 treated nasal endoscopy and packing by Dr. Penne Croak.   She continued to have worsening of heart failure with inability to wean pressors, found to have severe mitral regurgitation and was taken back to OR on 02/08/24 for Redo median sternotomy with MVR w/Mitris Resilia tissue valve. Post op RV failure required ECMO support 12/06- 12/08. She  continued to have worsening of renal status. Dr. Geralynn felt that AKI due due to contrast nephropathy with fluid overload despite high doses of IV diuretics and patient started on  CRRT 12/13.  She tolerated extubation by 12/14 and transitioned to HD. She failed attempts at intermittent HD with IV diuretic challenge.  TDC placed on 12/22 and has been transitioned to HD MWF.    Po intake has been poor in part due to dry mouth and intermittent issues with nausea and continues on tube feeds with additional oral nutritional supplements. She has declined PEG tube and family reported that at baseline she ate like a bird.  Megace  added to stimulate appetite and tube feeds being weaned off--currently at 35 cc/hr at nights.  WOC following for input on stage 3 buttocks DTI as well as rash/dermatitis.       Review of Systems  Constitutional:  Negative for fever.  HENT:  Negative for hearing loss.   Eyes:  Negative for blurred vision and double vision.  Respiratory:  Negative for cough and shortness of breath.   Cardiovascular:  Negative for chest pain and palpitations.  Gastrointestinal:  Negative for heartburn and nausea.  Musculoskeletal:  Negative for back pain and myalgias.  Neurological:  Positive for weakness. Negative for dizziness and headaches.  Psychiatric/Behavioral:  The patient is nervous/anxious and has insomnia.            Past Medical History:  Diagnosis Date   Anxiety     Candidemia (HCC) 12/2023   Nephrolithiasis  lithotripsy 12/14/23 w/stent   Pyelonephritis 12/2023   Thyroid  disease     Vertigo                 Past Surgical History:  Procedure Laterality Date   APPENDECTOMY       AUGMENTATION MAMMAPLASTY Bilateral 1981    removed in 2006   BREAST IMPLANT REMOVAL Bilateral 2007   CARDIOVERSION N/A 01/22/2024    Procedure: CARDIOVERSION;  Surgeon: Rolan Ezra RAMAN, MD;  Location: [facility] INVASIVE CV LAB;  Service: Cardiovascular;  Laterality: N/A;   CHOLECYSTECTOMY    2010   COLONOSCOPY WITH PROPOFOL  N/A 06/30/2021    Procedure: COLONOSCOPY WITH PROPOFOL ;  Surgeon: Jinny Carmine, MD;  Location: [facility] ENDOSCOPY;  Service: Endoscopy;  Laterality: N/A;   CONTROL OF EPISTAXIS   02/04/2024   ECMO CANNULATION N/A 02/09/2024    Procedure: ECMO CANNULATION;  Surgeon: Zenaida Morene PARAS, MD;  Location: [facility] INVASIVE CV LAB;  Service: Cardiovascular;  Laterality: N/A;   INTRAOPERATIVE TRANSESOPHAGEAL ECHOCARDIOGRAM N/A 01/10/2024    Procedure: ECHOCARDIOGRAM, TRANSESOPHAGEAL, INTRAOPERATIVE;  Surgeon: Kerrin Elspeth BROCKS, MD;  Location: [facility] OR;  Service: Open Heart Surgery;  Laterality: N/A;   INTRAOPERATIVE TRANSESOPHAGEAL ECHOCARDIOGRAM N/A 02/08/2024    Procedure: ECHOCARDIOGRAM, TRANSESOPHAGEAL, INTRAOPERATIVE;  Surgeon: Kerrin Elspeth BROCKS, MD;  Location: [facility] OR;  Service: Open Heart Surgery;  Laterality: N/A;   IR TUNNELED CENTRAL VENOUS CATH PLC W IMG   02/25/2024   LAPAROSCOPIC TOTAL HYSTERECTOMY   2006   MITRAL VALVE REPAIR N/A 01/10/2024    Procedure: REPAIR, MITRAL VALVE;  Surgeon: Kerrin Elspeth BROCKS, MD;  Location: [facility] OR;  Service: Open Heart Surgery;  Laterality: N/A;   MITRAL VALVE REPLACEMENT N/A 02/08/2024    Procedure: REPLACEMENT, MITRAL VALVE WITH MITRIS RESILIA MITRAL VALVE 27mm;  Surgeon: Kerrin Elspeth BROCKS, MD;  Location: [facility] OR;  Service: Open Heart Surgery;  Laterality: N/A;   REDO STERNOTOMY N/A 02/08/2024    Procedure: REDO STERNOTOMY;  Surgeon: Kerrin Elspeth BROCKS, MD;  Location: [facility] OR;  Service: Open Heart Surgery;  Laterality: N/A;   RIGHT HEART CATH N/A 02/05/2024    Procedure: RIGHT HEART CATH;  Surgeon: Zenaida Morene PARAS, MD;  Location: [facility] INVASIVE CV LAB;  Service: Cardiovascular;  Laterality: N/A;   RIGHT/LEFT HEART CATH AND CORONARY ANGIOGRAPHY N/A 01/04/2024    Procedure: RIGHT/LEFT HEART CATH AND CORONARY ANGIOGRAPHY;  Surgeon: Verlin Lonni BIRCH, MD;  Location: [facility] INVASIVE CV LAB;  Service: Cardiovascular;  Laterality: N/A;    TOOTH EXTRACTION N/A 01/07/2024    Procedure: DENTAL RESTORATION/EXTRACTIONS;  Surgeon: Sheryle Hamilton, DMD;  Location: [facility] OR;  Service: Oral Surgery;  Laterality: N/A;  EXTRACTION OF TEETH #2, #6, #7, #16, #29   TRANSESOPHAGEAL ECHOCARDIOGRAM (CATH LAB) N/A 01/01/2024    Procedure: TRANSESOPHAGEAL ECHOCARDIOGRAM;  Surgeon: Lonni Slain, MD;  Location: [facility] INVASIVE CV LAB;  Service: Cardiovascular;  Laterality: N/A;   TRANSESOPHAGEAL ECHOCARDIOGRAM (CATH LAB) N/A 01/22/2024    Procedure: TRANSESOPHAGEAL ECHOCARDIOGRAM;  Surgeon: Rolan Ezra RAMAN, MD;  Location: [facility] INVASIVE CV LAB;  Service: Cardiovascular;  Laterality: N/A;               Family History  Problem Relation Age of Onset   Hypertension Mother     Diabetes Father     Breast cancer Neg Hx            Social History: Lives alone and independent PTA. 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 She  reports that she has been smoking cigarettes--I PPD since early teens but none since hospitalization.  She has never used smokeless tobacco. She reports current alcohol use. She reports that she does not use drugs.     Allergies: [Allergies]  [Allergies]     Allergen Reactions   Codeine Anaphylaxis and Nausea And Vomiting   Bactoshield Chg [Chlorhexidine  Gluconate] Dermatitis   Chlorhexidine  Dermatitis and Rash   Red Dye #40 (Allura Red) Nausea Only             Medications Prior to Admission  Medication Sig Dispense Refill   clonazePAM  (KLONOPIN ) 0.5 MG tablet Take 0.25-0.5 mg by mouth at bedtime as needed for anxiety.       [EXPIRED] fluconazole  (DIFLUCAN ) 200 MG tablet Take 2 tablets by mouth daily for 3 days. Please present to hospital in [place]  for continued treatment. In the meantime, take 2 tablets by mouth daily for 3 days.       ondansetron  (ZOFRAN ) 4 MG tablet Take 1 tablet (4 mg total) by mouth every 8 (eight) hours as needed for  nausea or vomiting. 20 tablet 0   pantoprazole  (PROTONIX ) 20 MG tablet Take 1 tablet (20 mg total) by mouth daily. 30 tablet 2   lovastatin  (MEVACOR ) 20 MG tablet Take 1 tablet (20 mg total) by mouth at bedtime. (Patient not taking: Reported on 12/29/2023) 90 tablet 3   [DISCONTINUED] acetaminophen  (TYLENOL ) 325 MG tablet Take 650 mg by mouth daily as needed for fever.              Home: Home Living Family/patient expects to be discharged to:: Private residence Living Arrangements: Alone Available Help at Discharge: Family, Available 24 hours/day (dtr, [name] Jarvis, and son along with Aunt from [place] can provide 24/7) Type of Home: House Home Access: Stairs to enter [employer] of Steps: 5 Entrance Stairs-Rails: Left Home Layout: One level Bathroom Shower/Tub: Health Visitor: Pharmacist, Community: Yes Home Equipment: None  Lives With: Alone   Functional History: Prior Function Prior Level of Function : Independent/Modified Independent, Working/employed, Driving Mobility Comments: 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 Min assist for trunk support to rise to EOB. Transfers Overall transfer level: Needs assistance Equipment used: Rolling walker (2 wheels) Transfers: Sit to/from Stand Sit to Stand: Min assist Bed to/from chair/wheelchair/BSC transfer type:: Step pivot Stand pivot transfers: Max assist, +2 safety/equipment Step pivot transfers: Contact guard assist General transfer comment: Min assist  for boost to stand from bed x2 and recliner x3. Pt required cues each times for set-up (scoot forward, hands in lap,) demonstrating poor recall and carryover. Had a heavy posterior lean but this seemed to improve a bit with each repetition. Ambulation/Gait Ambulation/Gait assistance: Min assist Gait Distance (Feet): 28 Feet (+15,+20 (63 total)) Assistive device: Rolling walker (2 wheels) Gait Pattern/deviations: Decreased stride length, Drifts right/left, Trunk flexed, Narrow base of support, Step-through pattern, Leaning posteriorly General Gait Details: Cues for larger step length and to widen foot width with fair carryover. Frequent min assist for balance (multidirectional instability.) Mild  buckling of Rt knee at times but able to stablize with RW adequately. VSS throughout on RA. Completed 63 feet total with 3 seperate bouts, requiring seated rest. Gait velocity: decreased Gait velocity interpretation: <1.31 ft/sec, indicative of household ambulator   ADL: ADL Overall ADL's : Needs assistance/impaired Eating/Feeding: Minimal assistance, Sitting Eating/Feeding Details (indicate cue type and reason): ice chips Grooming: Contact guard assist, Standing Grooming Details (indicate cue type and reason): standing at sink Upper Body Bathing: Maximal assistance Upper Body Bathing Details (indicate cue type and reason): for back Lower Body Bathing: Total assistance, Bed level Lower Body Bathing Details (indicate cue type and reason): Cues for adhering to precautions Upper Body Dressing : Total assistance, Bed level Upper Body Dressing Details (indicate cue type and reason): verbally reviewed technique for UB dressing Lower Body Dressing: Moderate assistance Lower Body Dressing Details (indicate cue type and reason): mod A to hold BLE in figure 4 Toilet Transfer: Minimal assistance, +2 for physical assistance, Ambulation, Rolling walker (2 wheels) Toilet Transfer Details (indicate cue type and  reason): with close chair follow Toileting- Clothing Manipulation and Hygiene: Total assistance, +2 for physical assistance, +2 for safety/equipment, Sit to/from stand Toileting - Clothing Manipulation Details (indicate cue type and reason): Total +2 assist Functional mobility during ADLs: Minimal assistance, +2 for physical assistance, Rolling walker (2 wheels) General ADL Comments: dependent at this time   Cognition: Cognition Orientation Level: Oriented X4 Cognition Arousal: Alert Behavior During Therapy: Flat affect     Blood pressure 101/68, pulse 71, temperature 98 F (36.7 C), temperature source Oral, resp. rate 15, height 5' 4 (1.626 m), weight 62.7 kg, SpO2 96%. Physical Exam Nursing note reviewed.  Constitutional:      Appearance: Normal appearance.  HENT:     Head: Normocephalic.     Right Ear: External ear normal.     Left Ear: External ear normal.     Nose: Nose normal.     Comments: NGT    Mouth/Throat:     Mouth: Mucous membranes are moist.  Eyes:     Conjunctiva/sclera: Conjunctivae normal.  Neck:     Comments: Foam dressing on each side of the neck.  Cardiovascular:     Rate and Rhythm: Normal rate and regular rhythm.     Heart sounds: No murmur heard.    No gallop.     Comments: Chest wall incision C/D/I and healing well. Tunneled catheter on left chest wall.  Pulmonary:     Effort: Pulmonary effort is normal. No respiratory distress.     Breath sounds: No wheezing.  Abdominal:     General: Bowel sounds are normal. There is no distension.     Tenderness: There is no abdominal tenderness.  Musculoskeletal:     Cervical back: Normal range of motion.     Comments: Bilateral LE's in unna dressings. Minimal edema present.  Skin:    Comments: Right buttock wound pink, clean, foam dressing over the entire buttocks area Right heel wound appears dry. Both legs in unnas  Neurological:     Mental Status: She is oriented to person, place, and time.      Comments: Alert and oriented x 3. Normal insight and awareness. Intact Memory. Normal language and speech. Cranial nerve exam unremarkable. MMT: BUE 4/5 prox to distal. BLE 3+ HF, KE and 4+/5 ADF, APF. Sensory exam normal for light touch and pain in all 4 limbs. No limb ataxia or cerebellar signs. No abnormal tone appreciated.  SABRA  Psychiatric:        Mood and Affect: Mood normal.        Behavior: Behavior normal.     Lab Results Last 48 Hours        Results for orders placed or performed during the hospital encounter of 12/28/23 (from the past 48 hours)  Glucose, capillary     Status: Abnormal    Collection Time: 03/08/24 12:27 PM  Result Value Ref Range    Glucose-Capillary 143 (H) 70 - 99 mg/dL      Comment: Glucose reference range applies only to samples taken after fasting for at least 8 hours.  Glucose, capillary     Status: Abnormal    Collection Time: 03/08/24  4:23 PM  Result Value Ref Range    Glucose-Capillary 129 (H) 70 - 99 mg/dL      Comment: Glucose reference range applies only to samples taken after fasting for at least 8 hours.  Glucose, capillary     Status: Abnormal    Collection Time: 03/08/24  7:36 PM  Result Value Ref Range    Glucose-Capillary 120 (H) 70 - 99 mg/dL      Comment: Glucose reference range applies only to samples taken after fasting for at least 8 hours.  Glucose, capillary     Status: Abnormal    Collection Time: 03/09/24 12:00 AM  Result Value Ref Range    Glucose-Capillary 123 (H) 70 - 99 mg/dL      Comment: Glucose reference range applies only to samples taken after fasting for at least 8 hours.  Glucose, capillary     Status: Abnormal    Collection Time: 03/09/24  3:26 AM  Result Value Ref Range    Glucose-Capillary 159 (H) 70 - 99 mg/dL      Comment: Glucose reference range applies only to samples taken after fasting for at least 8 hours.  Renal function panel (daily at 0500)     Status: Abnormal    Collection Time: 03/09/24  4:35 AM   Result Value Ref Range    Sodium 133 (L) 135 - 145 mmol/L    Potassium 4.2 3.5 - 5.1 mmol/L    Chloride 92 (L) 98 - 111 mmol/L    CO2 23 22 - 32 mmol/L    Glucose, Bld 162 (H) 70 - 99 mg/dL      Comment: Glucose reference range applies only to samples taken after fasting for at least 8 hours.    BUN 108 (H) 8 - 23 mg/dL    Creatinine, Ser 6.48 (H) 0.44 - 1.00 mg/dL    Calcium  9.7 8.9 - 10.3 mg/dL    Phosphorus 6.6 (H) 2.5 - 4.6 mg/dL    Albumin  3.9 3.5 - 5.0 g/dL    GFR, Estimated 14 (L) >60 mL/min      Comment: (NOTE) Calculated using the CKD-EPI Creatinine Equation (2021)      Anion gap 18 (H) 5 - 15      Comment: Performed at [facility], [address] [place], [place] [address]  Magnesium      Status: Abnormal    Collection Time: 03/09/24  4:35 AM  Result Value Ref Range    Magnesium  2.5 (H) 1.7 - 2.4 mg/dL      Comment: Performed at [facility], [address] [place], [place] [address]  Glucose, capillary     Status: Abnormal    Collection Time: 03/09/24  7:50 AM  Result Value Ref Range  Glucose-Capillary 125 (H) 70 - 99 mg/dL      Comment: Glucose reference range applies only to samples taken after fasting for at least 8 hours.  Glucose, capillary     Status: Abnormal    Collection Time: 03/09/24 12:29 PM  Result Value Ref Range    Glucose-Capillary 134 (H) 70 - 99 mg/dL      Comment: Glucose reference range applies only to samples taken after fasting for at least 8 hours.  Glucose, capillary     Status: Abnormal    Collection Time: 03/09/24  4:27 PM  Result Value Ref Range    Glucose-Capillary 103 (H) 70 - 99 mg/dL      Comment: Glucose reference range applies only to samples taken after fasting for at least 8 hours.  Glucose, capillary     Status: Abnormal    Collection Time: 03/09/24  7:39 PM  Result Value Ref Range    Glucose-Capillary 163 (H) 70 - 99 mg/dL      Comment: Glucose reference range applies only to samples taken after fasting for at  least 8 hours.    Comment 1 Notify RN    Glucose, capillary     Status: Abnormal    Collection Time: 03/09/24 11:40 PM  Result Value Ref Range    Glucose-Capillary 142 (H) 70 - 99 mg/dL      Comment: Glucose reference range applies only to samples taken after fasting for at least 8 hours.    Comment 1 Notify RN    Glucose, capillary     Status: Abnormal    Collection Time: 03/10/24  4:58 AM  Result Value Ref Range    Glucose-Capillary 175 (H) 70 - 99 mg/dL      Comment: Glucose reference range applies only to samples taken after fasting for at least 8 hours.    Comment 1 Notify RN    Renal function panel (daily at 0500)     Status: Abnormal    Collection Time: 03/10/24  6:58 AM  Result Value Ref Range    Sodium 133 (L) 135 - 145 mmol/L    Potassium 4.3 3.5 - 5.1 mmol/L    Chloride 93 (L) 98 - 111 mmol/L    CO2 23 22 - 32 mmol/L    Glucose, Bld 166 (H) 70 - 99 mg/dL      Comment: Glucose reference range applies only to samples taken after fasting for at least 8 hours.    BUN 139 (H) 8 - 23 mg/dL    Creatinine, Ser 6.31 (H) 0.44 - 1.00 mg/dL    Calcium  9.6 8.9 - 10.3 mg/dL    Phosphorus 6.5 (H) 2.5 - 4.6 mg/dL    Albumin  3.8 3.5 - 5.0 g/dL    GFR, Estimated 13 (L) >60 mL/min      Comment: (NOTE) Calculated using the CKD-EPI Creatinine Equation (2021)      Anion gap 18 (H) 5 - 15      Comment: Performed at [facility], [address], [place], [place] [address]  Magnesium      Status: Abnormal    Collection Time: 03/10/24  6:58 AM  Result Value Ref Range    Magnesium  2.5 (H) 1.7 - 2.4 mg/dL      Comment: Performed at [facility], [address] [place], [place] [address]  Glucose, capillary     Status: Abnormal    Collection Time: 03/10/24  8:58 AM  Result Value Ref Range  Glucose-Capillary 110 (H) 70 - 99 mg/dL      Comment: Glucose reference range applies only to samples taken after fasting for at least 8 hours.      Imaging Results (Last 48 hours)  No  results found.         Blood pressure 101/68, pulse 71, temperature 98 F (36.7 C), temperature source Oral, resp. rate 15, height 5' 4 (1.626 m), weight 62.7 kg, SpO2 96%.   Medical Problem List and Plan: 1. Functional deficits secondary to debility after prolonged hospital stay related to mitral valve vegetations and MV repair and ultimate replacement.             -patient may  shower             -ELOS/Goals: 10-14 days, supervision goals with PT, OT, SLP 2.  Antithrombotics: -DVT/anticoagulation:  Pharmaceutical: Eliquis              -antiplatelet therapy:  3. Pain Management: Tylenol  prn.  4. Mood/Behavior/Sleep: LCSW to follow for evaluation and support             --Melatonin prn for insomnia. Also on Remeron .              -antipsychotic agents: N/A 5. Neuropsych/cognition: This patient is capable of making decisions on her own behalf. 6. Skin/Wound Care: Air mattress overlay for pressure relief.             --continue Ensure and prosource             --add Vitamin C and Zinc to promote wound healing.  7. Fluids/Electrolytes/Nutrition: Strict I/O.  --getting 1010 cc w/TF and schedule ensure. NO FR needed per  nephrology.    -transition to oral diet as able. Pt feels that appetite is improving.  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             --F/u with ID 01/06?-->discussed with Dr. Overton who will follow up with for input.  9. 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  10. AKI with dialysis initiated 02/16/24: On HD MWF at the end of the day to help with tolerance of therapy             --Cr at baseline but no signs of recovery so far.              --CLIP on hold. Needs to be able to sit up in a chair for HD.  11. A fib: Monitor HR TID--on amiodarone  and eliquis .  12. Anemia of critical illness:Being monitored and stable.              --thrombocytopenia has resolved.  13. CODE STATUS: FULL CODE but NO  CHEST COMPRESSIONS.              --nurse to place sign on door.        Sharlet GORMAN Schmitz, PA-C 03/10/2024  I have personally performed a face to face diagnostic evaluation of this patient and formulated the key components of the plan.  Additionally, I have personally reviewed laboratory data, imaging studies, as well as relevant notes and concur with the physician assistant's documentation above.  The patient's status has not changed from the original H&P.  Any changes in documentation from the acute care chart have been noted above.  Arthea IVAR Gunther, MD, LEELLEN

## 2024-03-10 NOTE — Progress Notes (Signed)
 Orthopedic Tech Progress Note Patient Details:  Tracy Baker 09/02/1955 [id number]   Spoke with RN this morning about applying fresh unna boots, but patient went to DIALYSIS before I could return. Will try again later today    Patient ID: Tracy Baker, female   DOB: Jul 03, 1955, 69 y.o.   MRN: [id number]  Tracy Baker Pac 03/10/2024, 3:13 PM

## 2024-03-10 NOTE — Progress Notes (Signed)
" °  [facility] Progress Note    Assessment/ Plan:   Dialysis dependent AKI, initiated 02/16/2024: Creatinine normal at baseline Currently on MWF schedule: 3.5h, 2-3L UF, no heparin . TDC. Next HD today- may need to increase rx time d/t significant azotemia between rx Following UOP and labs and intradialytic window for any suggestive GFR recovery, none to date Currently using Left IJ TDC placed on 12/22 with IR.   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 On antifungal therapy  Anemia -transfuse PRN for Hgb <7; Hb 10s  Hyponatremia: mild and intermittent.  Manage volume with dialysis, na/fluid restriction  Will follow.    Subjective:    Seen in room.  For HD today.  Sig azotemia this AM. No signs of uremia.      Objective:   BP 101/68 (BP Location: Left Arm)   Pulse 71   Temp 98 F (36.7 C) (Oral)   Resp 15   Ht 5' 4 (1.626 m)   Wt 62.7 kg   SpO2 96%   BMI 23.73 kg/m   Intake/Output Summary (Last 24 hours) at 03/10/2024 1158 Last data filed at 03/10/2024 0600 Gross per 24 hour  Intake 1039.99 ml  Output 300 ml  Net 739.99 ml    Weight change: -1.7 kg  Physical Exam: Gen: tired appearing, NAD CVS: normal rate, no rub Resp: bl chest rise, normal wob Abd: soft Ext: No significant edema noted Dialysis access:IJ tunneled  HD cath  Imaging: No results found.      Labs: BMET Recent Labs  Lab 03/04/24 0521 03/05/24 0559 03/06/24 0327 03/07/24 0508 03/08/24 0500 03/09/24 0435 03/10/24 0658  NA 134* 132* 132* 131* 132* 133* 133*  K 4.3 4.6 4.3 4.7 4.1 4.2 4.3  CL 93* 91* 93* 92* 91* 92* 93*  CO2 27 22 25  21* 25 23 23   GLUCOSE 141* 134* 159* 147* 185* 162* 166*  BUN 52* 85* 59* 102* 71* 108* 139*  CREATININE 2.43* 3.32* 2.46* 3.27* 2.80* 3.51* 3.68*  CALCIUM  9.5 9.7 9.5 9.8 9.6 9.7 9.6  PHOS 4.6 6.1* 5.4* 6.6* 5.7* 6.6* 6.5*   CBC Recent Labs  Lab  03/04/24 0520 03/05/24 0559 03/07/24 0730  WBC 4.9 4.7 6.0  NEUTROABS  --   --  3.8  HGB 10.4* 10.1* 10.9*  HCT 31.9* 31.1* 32.1*  MCV 98.2 98.7 95.5  PLT 162 153 155    Medications:     amiodarone   200 mg Oral Daily   apixaban   5 mg Oral BID   clonazePAM   0.5 mg Oral QHS   feeding supplement  237 mL Oral TID BM   feeding supplement (PROSource TF20)  60 mL Per Tube BID   feeding supplement (VITAL 1.5 CAL)  1,000 mL Per Tube Q24H   Gerhardt's butt cream   Topical BID   insulin  aspart  0-9 Units Subcutaneous Q4H   megestrol   80 mg Oral BID   mirtazapine   30 mg Oral QHS   mouth rinse  15 mL Mouth Rinse 4 times per day   pantoprazole   40 mg Oral Daily   QUEtiapine   25 mg Oral QHS   sertraline   25 mg Oral QHS   sodium chloride  flush  10-40 mL Intracatheter Q12H       "

## 2024-03-10 NOTE — Plan of Care (Signed)
  Problem: Education: Goal: Knowledge of General Education information will improve Description: Including pain rating scale, medication(s)/side effects and non-pharmacologic comfort measures Outcome: Progressing   Problem: Clinical Measurements: Goal: Respiratory complications will improve Outcome: Progressing   Problem: Activity: Goal: Risk for activity intolerance will decrease Outcome: Progressing   Problem: Pain Managment: Goal: General experience of comfort will improve and/or be controlled Outcome: Progressing

## 2024-03-10 NOTE — Progress Notes (Signed)
 Came in awake and oriented with no complaints Consent verified and on file. Tx started with no complaints  UF removal:2668mL Tx duration: 3.22 hours  Access used: Left CVC Access issue: None  Tx ended with 8 mins RTD due to pt. Not feeling well and complained of chest discomfort. Hooked to 02 at 4LPM via nasal cannula. Pt. Now denies any discomfort. Catheter locked and dwelled with heparin . Endorsed to floor nurse. Transported back to room.  Estanislao Morley Myria Steenbergen,RN Kidney Care Unit

## 2024-03-10 NOTE — Progress Notes (Addendum)
 "        [facility]  Date of Admission:  12/28/2023    Principal Problem:   C. albicans candidemia [facility]) Active Problems:   Hypokalemia   Hypocalcemia   Transaminitis   Kidney stone   Endocarditis of mitral valve   Fungal endocarditis   S/P MVR (mitral valve repair)   Acute candidal endocarditis   Epistaxis   Protein-calorie malnutrition, severe   S/P MVR (mitral valve replacement)   Acute respiratory failure with hypoxia (HCC)   Shock (HCC)   Patient receiving ECMO   AKI (acute kidney injury)          Assessment: 69 y.o. 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 Will dose 12 mg/kg daily (400 mg daily) from 02/08/24-05/04/2024, then 6 mg/kg daily (200 mg daily) there after indefinitely   #other complication Patient needed ecmo support for initial failed 01/10/24 mv repair She also suffered Aki requiring dialysis -- currently has left internal jugular tunneled hd catheter afib    Plan: -can change fluconazole  400 mg daily; on hemodialysis day, give after dialysis run -weekly lft -maintain standard isolation precaution -new id clinic f/u 2/24 @ 930 -discussed with CIR team   Microbiology:   Antibiotics: Micafungin  10/25-11/07  fluconazole  01/11/24-c   SUBJECTIVE: Getting dialysis, no complaint  Interval: Afebrile overnight  Review of Systems: Review of Systems  All other systems reviewed and are  negative.    Scheduled Meds:  amiodarone   200 mg Oral Daily   apixaban   5 mg Oral BID   clonazePAM   0.5 mg Oral QHS   feeding supplement  237 mL Oral TID BM   feeding supplement (PROSource TF20)  60 mL Per Tube BID   feeding supplement (VITAL 1.5 CAL)  1,000 mL Per Tube Q24H   Gerhardt's butt cream   Topical BID   insulin  aspart  0-9 Units Subcutaneous Q4H   megestrol   80 mg Oral BID   mirtazapine   30 mg Oral QHS   mouth rinse  15 mL Mouth Rinse 4 times per day   pantoprazole   40 mg Oral Daily   QUEtiapine   25 mg Oral QHS   sertraline   25 mg Oral QHS   sodium chloride  flush  10-40 mL Intracatheter Q12H   Continuous Infusions:  anticoagulant sodium citrate      fluconazole  (DIFLUCAN ) IV 400 mg (03/09/24 1739)   promethazine  (PHENERGAN ) injection (IM or IVPB) Stopped (02/28/24 2315)   PRN Meds:.acetaminophen , albuterol , alteplase , anticoagulant sodium citrate , antiseptic oral rinse, heparin , lidocaine  (PF), lidocaine -prilocaine , magic mouthwash, mouth rinse, pentafluoroprop-tetrafluoroeth, polyethylene glycol, promethazine  (PHENERGAN ) injection (IM or IVPB), simethicone , sodium chloride  flush Allergies[1]  OBJECTIVE: Vitals:   03/10/24 1345 03/10/24 1347 03/10/24 1355 03/10/24 1410  BP:  103/62 119/61 95/65  Pulse: 71 71 70 71  Resp: 14 14 14 14   Temp:      TempSrc:      SpO2: 98% 97% 100% 100%  Weight:      Height:       Body mass  index is 23.46 kg/m.  Physical Exam Constitutional:      Appearance: Normal appearance.  HENT:     Head: Normocephalic and atraumatic.     Right Ear: Tympanic membrane normal.     Left Ear: Tympanic membrane normal.     Nose: Nose normal.     Mouth/Throat:     Mouth: Mucous membranes are moist.  Eyes:     Extraocular Movements: Extraocular movements intact.     Conjunctiva/sclera: Conjunctivae normal.     Pupils: Pupils are equal, round, and reactive to light.  Cardiovascular:     Heart sounds:     No friction rub. No gallop.      Comments: Sternal wound Pulmonary:     Effort: Pulmonary effort is normal.     Breath sounds: Normal breath sounds.  Abdominal:     General: Abdomen is flat.     Palpations: Abdomen is soft.  Musculoskeletal:        General: Normal range of motion.  Skin:    General: Skin is warm and dry.  Neurological:     General: No focal deficit present.     Mental Status: She is alert and oriented to person, place, and time.  Psychiatric:        Mood and Affect: Mood normal.     Sternal wound all healed  Left sided HD line insertion site no purulence/erythema  Lab Results Lab Results  Component Value Date   WBC 6.0 03/07/2024   HGB 10.9 (L) 03/07/2024   HCT 32.1 (L) 03/07/2024   MCV 95.5 03/07/2024   PLT 155 03/07/2024    Lab Results  Component Value Date   CREATININE 3.68 (H) 03/10/2024   BUN 139 (H) 03/10/2024   NA 133 (L) 03/10/2024   K 4.3 03/10/2024   CL 93 (L) 03/10/2024   CO2 23 03/10/2024    Lab Results  Component Value Date   ALT 12 03/07/2024   AST 17 03/07/2024   ALKPHOS 241 (H) 03/07/2024   BILITOT 0.8 03/07/2024        Constance ONEIDA Passer, MD [facility] 03/10/2024, 2:44 PM  Evaluation of this patient requires complex antimicrobial therapy evaluation and counseling + isolation needs for disease transmission risk assessment and mitigation       [1]  Allergies Allergen Reactions   Codeine Anaphylaxis and Nausea And Vomiting   Bactoshield Chg [Chlorhexidine  Gluconate] Dermatitis   Chlorhexidine  Dermatitis and Rash   Red Dye #40 (Allura Red) Nausea Only   "

## 2024-03-10 NOTE — Consult Note (Signed)
 WOC Nurse Consult Note: Reason for Consult: New consult was placed for hyperkeratosis to left heel.  Order was placed for Xeroform to the area.  Is wrapped in Unna boots bilaterally.   WOC Nurse wound follow up Wound type: Stage 3 pressure injury to right buttock, improving.  Measurement: 2 cm x 1.2 cm x 0.3 cm  Wound bed: pink and moist  Drainage (amount, consistency, odor) minimal weeping Periwound: area at risk, area was larger with some resolved deep tissue injury noted .  IS on dialysis three times weekly, microshifts needed while in recliner or bed for long periods of time.  Offload heel pressure with prevalon boots.    Dressing procedure/placement/frequency: Continue Xeroform and foam.  Turn and reposition.   Will follow.  Darice Cooley MSN, RN, FNP-BC CWON Wound, Ostomy, Continence Nurse Outpatient [facility] [phone] Work cell phone:  [phone]

## 2024-03-10 NOTE — H&P (Signed)
 "   Physical Medicine and Rehabilitation Admission H&P    Chief Complaint  Patient presents with   Debility.     HPI:  Makensie Mulhall is a 69 year old female with history of thyroid  dx, anxiety, nephrolithiasis s/p lithotripsy w/ ureteral stent at [facility] 12/14/23 which were removed on 10/14 as patient going OOT to attend wedding in [facility]. While in [place], [name], she developed pyelopheritis with candida bacteremia due to UTI treated with IV fluconazole  at [facility]. She signed out AMA to return for treatment closer to home and presented to [facility] 12/29/23.  Dr. Fleeta Rothman consulted  for input on persistent candemiaa as repeat [facility] 10/20 still positive for candida albicans and recommended change to miafungin, repeat TTE as well as ophthalmology consult. Opth consult by Dr. Mike Fleeta was negative for fugal endopthalmitis. TEE showed very small MV vegetation and Dr. Kerrin recommended assessment of coronaries as well as dental evaluation prior to surgery.   She underwent cardiac cath by Dr. Verlin 10/31 showed no evidence of CAD and normal left/right heart pressures.  She underwent  extraction of multiple non-restorable teeth due to dental caries by Dr. Sheryle and underwent repair of mitral valve on 01/10/24 with post cardiotomy shock with A fib managed with amiodarone  gtt as well as fevers and treated with broad spectrum antibiotics due to concerns of aspiration PNA. Heart failure team consulted to manage overload and required Milrinone , Epi and Norepi.    She developed  epistaxis in setting of ASA and Eliquis  on 11/17 requiring d/c of ASA but had recurrent episode 12/01 treated nasal endoscopy and packing by Dr. Penne Croak.   She continued to have worsening of heart failure with inability to wean pressors, found to have severe mitral regurgitation and was taken back to OR on 02/08/24 for Redo median sternotomy with MVR w/Mitris Resilia tissue valve. Post op RV failure required ECMO support 12/06- 12/08. She  continued to have worsening of renal status. Dr. Geralynn felt that AKI due due to contrast nephropathy with fluid overload despite high doses of IV diuretics and patient started on  CRRT 12/13.  She tolerated extubation by 12/14 and transitioned to HD. She failed attempts at intermittent HD with IV diuretic challenge.  TDC placed on 12/22 and has been transitioned to HD MWF.   Po intake has been poor in part due to dry mouth and intermittent issues with nausea and continues on tube feeds with additional oral nutritional supplements. She has declined PEG tube and family reported that at baseline she ate like a bird.  Megace  added to stimulate appetite and tube feeds being weaned off--currently at 35 cc/hr at nights.  WOC following for input on stage 3 buttocks DTI as well as rash/dermatitis.     Review of Systems  Constitutional:  Negative for fever.  HENT:  Negative for hearing loss.   Eyes:  Negative for blurred vision and double vision.  Respiratory:  Negative for cough and shortness of breath.   Cardiovascular:  Negative for chest pain and palpitations.  Gastrointestinal:  Negative for heartburn and nausea.  Musculoskeletal:  Negative for back pain and myalgias.  Neurological:  Positive for weakness. Negative for dizziness and headaches.  Psychiatric/Behavioral:  The patient is nervous/anxious and has insomnia.      Past Medical History:  Diagnosis Date   Anxiety    Candidemia (HCC) 12/2023   Nephrolithiasis    lithotripsy 12/14/23 w/stent   Pyelonephritis 12/2023   Thyroid  disease    Vertigo  Past Surgical History:  Procedure Laterality Date   APPENDECTOMY     AUGMENTATION MAMMAPLASTY Bilateral 1981   removed in 2006   BREAST IMPLANT REMOVAL Bilateral 2007   CARDIOVERSION N/A 01/22/2024   Procedure: CARDIOVERSION;  Surgeon: Rolan Ezra RAMAN, MD;  Location: [facility] INVASIVE CV LAB;  Service: Cardiovascular;  Laterality: N/A;   CHOLECYSTECTOMY  2010   COLONOSCOPY WITH PROPOFOL   N/A 06/30/2021   Procedure: COLONOSCOPY WITH PROPOFOL ;  Surgeon: Jinny Carmine, MD;  Location: [facility] ENDOSCOPY;  Service: Endoscopy;  Laterality: N/A;   CONTROL OF EPISTAXIS  02/04/2024   ECMO CANNULATION N/A 02/09/2024   Procedure: ECMO CANNULATION;  Surgeon: Zenaida Morene PARAS, MD;  Location: [facility] INVASIVE CV LAB;  Service: Cardiovascular;  Laterality: N/A;   INTRAOPERATIVE TRANSESOPHAGEAL ECHOCARDIOGRAM N/A 01/10/2024   Procedure: ECHOCARDIOGRAM, TRANSESOPHAGEAL, INTRAOPERATIVE;  Surgeon: Kerrin Elspeth BROCKS, MD;  Location: [facility] OR;  Service: Open Heart Surgery;  Laterality: N/A;   INTRAOPERATIVE TRANSESOPHAGEAL ECHOCARDIOGRAM N/A 02/08/2024   Procedure: ECHOCARDIOGRAM, TRANSESOPHAGEAL, INTRAOPERATIVE;  Surgeon: Kerrin Elspeth BROCKS, MD;  Location: [facility] OR;  Service: Open Heart Surgery;  Laterality: N/A;   IR TUNNELED CENTRAL VENOUS CATH PLC W IMG  02/25/2024   LAPAROSCOPIC TOTAL HYSTERECTOMY  2006   MITRAL VALVE REPAIR N/A 01/10/2024   Procedure: REPAIR, MITRAL VALVE;  Surgeon: Kerrin Elspeth BROCKS, MD;  Location: [facility] OR;  Service: Open Heart Surgery;  Laterality: N/A;   MITRAL VALVE REPLACEMENT N/A 02/08/2024   Procedure: REPLACEMENT, MITRAL VALVE WITH MITRIS RESILIA MITRAL VALVE 27mm;  Surgeon: Kerrin Elspeth BROCKS, MD;  Location: [facility] OR;  Service: Open Heart Surgery;  Laterality: N/A;   REDO STERNOTOMY N/A 02/08/2024   Procedure: REDO STERNOTOMY;  Surgeon: Kerrin Elspeth BROCKS, MD;  Location: [facility] OR;  Service: Open Heart Surgery;  Laterality: N/A;   RIGHT HEART CATH N/A 02/05/2024   Procedure: RIGHT HEART CATH;  Surgeon: Zenaida Morene PARAS, MD;  Location: [facility] INVASIVE CV LAB;  Service: Cardiovascular;  Laterality: N/A;   RIGHT/LEFT HEART CATH AND CORONARY ANGIOGRAPHY N/A 01/04/2024   Procedure: RIGHT/LEFT HEART CATH AND CORONARY ANGIOGRAPHY;  Surgeon: Verlin Lonni BIRCH, MD;  Location: [facility] INVASIVE CV LAB;  Service: Cardiovascular;  Laterality: N/A;   TOOTH EXTRACTION N/A 01/07/2024   Procedure:  DENTAL RESTORATION/EXTRACTIONS;  Surgeon: Sheryle Hamilton, DMD;  Location: [facility] OR;  Service: Oral Surgery;  Laterality: N/A;  EXTRACTION OF TEETH #2, #6, #7, #16, #29   TRANSESOPHAGEAL ECHOCARDIOGRAM (CATH LAB) N/A 01/01/2024   Procedure: TRANSESOPHAGEAL ECHOCARDIOGRAM;  Surgeon: Lonni Slain, MD;  Location: [facility] INVASIVE CV LAB;  Service: Cardiovascular;  Laterality: N/A;   TRANSESOPHAGEAL ECHOCARDIOGRAM (CATH LAB) N/A 01/22/2024   Procedure: TRANSESOPHAGEAL ECHOCARDIOGRAM;  Surgeon: Rolan Ezra RAMAN, MD;  Location: [facility] INVASIVE CV LAB;  Service: Cardiovascular;  Laterality: N/A;    Family History  Problem Relation Age of Onset   Hypertension Mother    Diabetes Father    Breast cancer Neg Hx     Social History: Lives alone and independent PTA. 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 Still works part time as a Restaurant Manager, [employer]. She  reports that she has been smoking cigarettes--I PPD since early teens but none since hospitalization.  She has never used smokeless tobacco. She reports current alcohol use. She reports that she does not use drugs.   Allergies: Allergies[1]   Medications Prior to Admission  Medication Sig Dispense Refill   clonazePAM  (KLONOPIN ) 0.5 MG tablet Take 0.25-0.5 mg by mouth at bedtime as needed for  anxiety.     [EXPIRED] fluconazole  (DIFLUCAN ) 200 MG tablet Take 2 tablets by mouth daily for 3 days. Please present to hospital in [place]  for continued treatment. In the meantime, take 2 tablets by mouth daily for 3 days.     ondansetron  (ZOFRAN ) 4 MG tablet Take 1 tablet (4 mg total) by mouth every 8 (eight) hours as needed for nausea or vomiting. 20 tablet 0   pantoprazole  (PROTONIX ) 20 MG tablet Take 1 tablet (20 mg total) by mouth daily. 30 tablet 2   lovastatin  (MEVACOR ) 20 MG tablet Take 1 tablet (20 mg total) by mouth at bedtime. (Patient not taking: Reported on 12/29/2023) 90 tablet 3   [DISCONTINUED] acetaminophen  (TYLENOL )  325 MG tablet Take 650 mg by mouth daily as needed for fever.      Home: Home Living Family/patient expects to be discharged to:: Private residence Living Arrangements: Alone Available Help at Discharge: Family, Available 24 hours/day (dtr, [name] Jarvis, and son along with Aunt from [place] can provide 24/7) Type of Home: House Home Access: Stairs to enter [employer] of Steps: 5 Entrance Stairs-Rails: Left Home Layout: One level Bathroom Shower/Tub: Health Visitor: Pharmacist, Community: Yes Home Equipment: None  Lives With: Alone   Functional History: Prior Function Prior Level of Function : Independent/Modified Independent, Working/employed, Driving 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 Min assist for trunk support to rise to EOB. Transfers Overall transfer level: Needs assistance Equipment used: Rolling walker (2 wheels) Transfers: Sit to/from Stand Sit to Stand: Min assist Bed to/from chair/wheelchair/BSC transfer type:: Step pivot Stand pivot transfers: Max assist, +2 safety/equipment Step pivot transfers: Contact guard assist General transfer comment: Min assist for boost to stand from bed x2 and recliner x3. Pt required cues each times for set-up (scoot forward, hands in lap,) demonstrating poor recall and carryover. Had a heavy posterior lean but this seemed to improve a bit with each repetition. Ambulation/Gait Ambulation/Gait assistance: Min assist Gait Distance (Feet): 28  Feet (+15,+20 (63 total)) Assistive device: Rolling walker (2 wheels) Gait Pattern/deviations: Decreased stride length, Drifts right/left, Trunk flexed, Narrow base of support, Step-through pattern, Leaning posteriorly General Gait Details: Cues for larger step length and to widen foot width with fair carryover. Frequent min assist for balance (multidirectional instability.) Mild buckling of Rt knee at times but able to stablize with RW adequately. VSS throughout on RA. Completed 63 feet total with 3 seperate bouts, requiring seated rest. Gait velocity: decreased Gait velocity interpretation: <1.31 ft/sec, indicative of household ambulator    ADL: ADL Overall ADL's : Needs assistance/impaired Eating/Feeding: Minimal assistance, Sitting Eating/Feeding Details (indicate cue type and reason): ice chips Grooming: Contact guard assist, Standing Grooming Details (indicate cue type and reason): standing at sink Upper Body Bathing: Maximal assistance Upper Body Bathing Details (indicate cue type and reason): for back Lower Body Bathing: Total assistance, Bed level Lower Body Bathing Details (indicate cue type and reason): Cues for adhering to precautions Upper Body Dressing : Total assistance, Bed level Upper Body Dressing Details (indicate cue type and reason): verbally reviewed technique  for UB dressing Lower Body Dressing: Moderate assistance Lower Body Dressing Details (indicate cue type and reason): mod A to hold BLE in figure 4 Toilet Transfer: Minimal assistance, +2 for physical assistance, Ambulation, Rolling walker (2 wheels) Toilet Transfer Details (indicate cue type and reason): with close chair follow Toileting- Clothing Manipulation and Hygiene: Total assistance, +2 for physical assistance, +2 for safety/equipment, Sit to/from stand Toileting - Clothing Manipulation Details (indicate cue type and reason): Total +2 assist Functional mobility during ADLs: Minimal assistance, +2 for  physical assistance, Rolling walker (2 wheels) General ADL Comments: dependent at this time  Cognition: Cognition Orientation Level: Oriented X4 Cognition Arousal: Alert Behavior During Therapy: Flat affect   Blood pressure 101/68, pulse 71, temperature 98 F (36.7 C), temperature source Oral, resp. rate 15, height 5' 4 (1.626 m), weight 62.7 kg, SpO2 96%. Physical Exam Nursing note reviewed.  Constitutional:      Appearance: Normal appearance.  HENT:     Head: Normocephalic.     Right Ear: External ear normal.     Left Ear: External ear normal.     Nose: Nose normal.     Comments: NGT    Mouth/Throat:     Mouth: Mucous membranes are moist.  Eyes:     Conjunctiva/sclera: Conjunctivae normal.  Neck:     Comments: Foam dressing on each side of the neck.  Cardiovascular:     Rate and Rhythm: Normal rate and regular rhythm.     Heart sounds: No murmur heard.    No gallop.     Comments: Chest wall incision C/D/I and healing well. Tunneled catheter on left chest wall.  Pulmonary:     Effort: Pulmonary effort is normal. No respiratory distress.     Breath sounds: No wheezing.  Abdominal:     General: Bowel sounds are normal. There is no distension.     Tenderness: There is no abdominal tenderness.  Musculoskeletal:     Cervical back: Normal range of motion.     Comments: Bilateral LE's in unna dressings. Minimal edema present.  Skin:    Comments: Right buttock wound pink, clean, foam dressing over the entire buttocks area Right heel wound appears dry. Both legs in unnas  Neurological:     Mental Status: She is oriented to person, place, and time.     Comments: Alert and oriented x 3. Normal insight and awareness. Intact Memory. Normal language and speech. Cranial nerve exam unremarkable. MMT: BUE 4/5 prox to distal. BLE 3+ HF, KE and 4+/5 ADF, APF. Sensory exam normal for light touch and pain in all 4 limbs. No limb ataxia or cerebellar signs. No abnormal tone appreciated.  SABRA     Psychiatric:        Mood and Affect: Mood normal.        Behavior: Behavior normal.    Results for orders placed or performed during the hospital encounter of 12/28/23 (from the past 48 hours)  Glucose, capillary     Status: Abnormal   Collection Time: 03/08/24 12:27 PM  Result Value Ref Range   Glucose-Capillary 143 (H) 70 - 99 mg/dL    Comment: Glucose reference range applies only to samples taken after fasting for at least 8 hours.  Glucose, capillary     Status: Abnormal   Collection Time: 03/08/24  4:23 PM  Result Value Ref Range   Glucose-Capillary 129 (H) 70 - 99 mg/dL    Comment: Glucose reference range applies only to samples taken after fasting  for at least 8 hours.  Glucose, capillary     Status: Abnormal   Collection Time: 03/08/24  7:36 PM  Result Value Ref Range   Glucose-Capillary 120 (H) 70 - 99 mg/dL    Comment: Glucose reference range applies only to samples taken after fasting for at least 8 hours.  Glucose, capillary     Status: Abnormal   Collection Time: 03/09/24 12:00 AM  Result Value Ref Range   Glucose-Capillary 123 (H) 70 - 99 mg/dL    Comment: Glucose reference range applies only to samples taken after fasting for at least 8 hours.  Glucose, capillary     Status: Abnormal   Collection Time: 03/09/24  3:26 AM  Result Value Ref Range   Glucose-Capillary 159 (H) 70 - 99 mg/dL    Comment: Glucose reference range applies only to samples taken after fasting for at least 8 hours.  Renal function panel (daily at 0500)     Status: Abnormal   Collection Time: 03/09/24  4:35 AM  Result Value Ref Range   Sodium 133 (L) 135 - 145 mmol/L   Potassium 4.2 3.5 - 5.1 mmol/L   Chloride 92 (L) 98 - 111 mmol/L   CO2 23 22 - 32 mmol/L   Glucose, Bld 162 (H) 70 - 99 mg/dL    Comment: Glucose reference range applies only to samples taken after fasting for at least 8 hours.   BUN 108 (H) 8 - 23 mg/dL   Creatinine, Ser 6.48 (H) 0.44 - 1.00 mg/dL   Calcium  9.7 8.9 - 10.3  mg/dL   Phosphorus 6.6 (H) 2.5 - 4.6 mg/dL   Albumin  3.9 3.5 - 5.0 g/dL   GFR, Estimated 14 (L) >60 mL/min    Comment: (NOTE) Calculated using the CKD-EPI Creatinine Equation (2021)    Anion gap 18 (H) 5 - 15    Comment: Performed at [facility], [address], [place], [place] [address]  Magnesium      Status: Abnormal   Collection Time: 03/09/24  4:35 AM  Result Value Ref Range   Magnesium  2.5 (H) 1.7 - 2.4 mg/dL    Comment: Performed at [facility], [address], [place], [place] [address]  Glucose, capillary     Status: Abnormal   Collection Time: 03/09/24  7:50 AM  Result Value Ref Range   Glucose-Capillary 125 (H) 70 - 99 mg/dL    Comment: Glucose reference range applies only to samples taken after fasting for at least 8 hours.  Glucose, capillary     Status: Abnormal   Collection Time: 03/09/24 12:29 PM  Result Value Ref Range   Glucose-Capillary 134 (H) 70 - 99 mg/dL    Comment: Glucose reference range applies only to samples taken after fasting for at least 8 hours.  Glucose, capillary     Status: Abnormal   Collection Time: 03/09/24  4:27 PM  Result Value Ref Range   Glucose-Capillary 103 (H) 70 - 99 mg/dL    Comment: Glucose reference range applies only to samples taken after fasting for at least 8 hours.  Glucose, capillary     Status: Abnormal   Collection Time: 03/09/24  7:39 PM  Result Value Ref Range   Glucose-Capillary 163 (H) 70 - 99 mg/dL    Comment: Glucose reference range applies only to samples taken after fasting for at least 8 hours.   Comment 1 Notify RN   Glucose, capillary     Status: Abnormal   Collection  Time: 03/09/24 11:40 PM  Result Value Ref Range   Glucose-Capillary 142 (H) 70 - 99 mg/dL    Comment: Glucose reference range applies only to samples taken after fasting for at least 8 hours.   Comment 1 Notify RN   Glucose, capillary     Status: Abnormal   Collection Time: 03/10/24  4:58 AM  Result Value Ref Range    Glucose-Capillary 175 (H) 70 - 99 mg/dL    Comment: Glucose reference range applies only to samples taken after fasting for at least 8 hours.   Comment 1 Notify RN   Renal function panel (daily at 0500)     Status: Abnormal   Collection Time: 03/10/24  6:58 AM  Result Value Ref Range   Sodium 133 (L) 135 - 145 mmol/L   Potassium 4.3 3.5 - 5.1 mmol/L   Chloride 93 (L) 98 - 111 mmol/L   CO2 23 22 - 32 mmol/L   Glucose, Bld 166 (H) 70 - 99 mg/dL    Comment: Glucose reference range applies only to samples taken after fasting for at least 8 hours.   BUN 139 (H) 8 - 23 mg/dL   Creatinine, Ser 6.31 (H) 0.44 - 1.00 mg/dL   Calcium  9.6 8.9 - 10.3 mg/dL   Phosphorus 6.5 (H) 2.5 - 4.6 mg/dL   Albumin  3.8 3.5 - 5.0 g/dL   GFR, Estimated 13 (L) >60 mL/min    Comment: (NOTE) Calculated using the CKD-EPI Creatinine Equation (2021)    Anion gap 18 (H) 5 - 15    Comment: Performed at [facility], [address] [place], [place] [address]  Magnesium      Status: Abnormal   Collection Time: 03/10/24  6:58 AM  Result Value Ref Range   Magnesium  2.5 (H) 1.7 - 2.4 mg/dL    Comment: Performed at [facility], [address] [place], [place] [address]  Glucose, capillary     Status: Abnormal   Collection Time: 03/10/24  8:58 AM  Result Value Ref Range   Glucose-Capillary 110 (H) 70 - 99 mg/dL    Comment: Glucose reference range applies only to samples taken after fasting for at least 8 hours.   No results found.    Blood pressure 101/68, pulse 71, temperature 98 F (36.7 C), temperature source Oral, resp. rate 15, height 5' 4 (1.626 m), weight 62.7 kg, SpO2 96%.  Medical Problem List and Plan: 1. Functional deficits secondary to debility after prolonged hospital stay related to mitral valve vegetations and MV repair and ultimate replacement.  -patient may  shower  -ELOS/Goals: 10-14 days, supervision goals with PT, OT, SLP 2.  Antithrombotics: -DVT/anticoagulation:   Pharmaceutical: Eliquis   -antiplatelet therapy:  3. Pain Management: Tylenol  prn.  4. Mood/Behavior/Sleep: LCSW to follow for evaluation and support  --Melatonin prn for insomnia. Also on Remeron .   -antipsychotic agents: N/A 5. Neuropsych/cognition: This patient is capable of making decisions on her own behalf. 6. Skin/Wound Care: Air mattress overlay for pressure relief.  --continue Ensure and prosource  --add Vitamin C and Zinc to promote wound healing.  7. Fluids/Electrolytes/Nutrition: Strict I/O.  --getting 1010 cc w/TF and schedule ensure. NO FR needed per  nephrology.    -transition to oral diet as able. Pt feels that appetite is improving.  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  --F/u with ID 01/06?-->discussed with Dr. Overton who will follow up with for input.  9. Mitral valve  replacement with redo 12/05: Sternal precautions continue.  10. AKI with dialysis initiated 02/16/24: On HD MWF at the end of the day to help with tolerance of therapy  --Cr at baseline but no signs of recovery so far.   --CLIP on hold. Needs to be able to sit up in a chair for HD.  11. A fib: Monitor HR TID--on amiodarone  and eliquis .  12. Anemia of critical illness:Being monitored and stable.   --thrombocytopenia has resolved.  13. CODE STATUS: FULL CODE but NO CHEST COMPRESSIONS.   --nurse to place sign on door.     Sharlet GORMAN Schmitz, PA-C 03/10/2024     [1]  Allergies Allergen Reactions   Codeine Anaphylaxis and Nausea And Vomiting   Bactoshield Chg [Chlorhexidine  Gluconate] Dermatitis   Chlorhexidine  Dermatitis and Rash   Red Dye #40 (Allura Red) Nausea Only   "

## 2024-03-11 ENCOUNTER — Inpatient Hospital Stay: Admitting: Infectious Disease

## 2024-03-11 DIAGNOSIS — R5381 Other malaise: Secondary | ICD-10-CM | POA: Diagnosis not present

## 2024-03-11 LAB — GLUCOSE, CAPILLARY
Glucose-Capillary: 200 mg/dL — ABNORMAL HIGH (ref 70–99)
Glucose-Capillary: 126 mg/dL — ABNORMAL HIGH (ref 70–99)
Glucose-Capillary: 123 mg/dL — ABNORMAL HIGH (ref 70–99)
Glucose-Capillary: 108 mg/dL — ABNORMAL HIGH (ref 70–99)

## 2024-03-11 LAB — HEPATITIS B SURFACE ANTIGEN: Hepatitis B Surface Ag: NONREACTIVE

## 2024-03-11 LAB — MAGNESIUM: Magnesium: 2.3 mg/dL (ref 1.7–2.4)

## 2024-03-11 MED ORDER — POLYETHYLENE GLYCOL 3350 17 G PO PACK: 17.0000 g | PACK | Freq: Every day | ORAL | Status: DC | PRN

## 2024-03-11 MED ORDER — KIDNEY FAILURE BOOK: Freq: Once | Status: DC

## 2024-03-11 MED ORDER — PANTOPRAZOLE SODIUM 40 MG PO TBEC
40.0000 mg | DELAYED_RELEASE_TABLET | Freq: Every day | ORAL | Status: DC
  Administered 2024-03-12 – 2024-04-02 (×22): 40 mg via ORAL
  Filled 2024-03-11 (×22): qty 1

## 2024-03-11 MED ORDER — SODIUM CHLORIDE 0.9% FLUSH: 10.0000 mL | INTRAVENOUS | Status: DC | PRN

## 2024-03-11 MED ORDER — VITAL 1.5 CAL PO LIQD
1000.0000 mL | ORAL | Status: DC
  Administered 2024-03-11 – 2024-03-12 (×2): 1000 mL
  Filled 2024-03-11 (×3): qty 1000

## 2024-03-11 MED ORDER — OXYCODONE-ACETAMINOPHEN 5-325 MG PO TABS: 1.0000 | ORAL_TABLET | ORAL | Status: DC | PRN

## 2024-03-11 MED ORDER — HYDROCERIN EX CREA
TOPICAL_CREAM | Freq: Two times a day (BID) | CUTANEOUS | Status: DC
  Filled 2024-03-11: qty 113

## 2024-03-11 MED ORDER — PREGABALIN 25 MG PO CAPS
25.0000 mg | ORAL_CAPSULE | Freq: Every day | ORAL | Status: DC
  Administered 2024-03-11 – 2024-03-12 (×2): 25 mg via ORAL
  Filled 2024-03-11 (×2): qty 1

## 2024-03-11 MED ORDER — SODIUM CHLORIDE 0.9% FLUSH: 10.0000 mL | Freq: Two times a day (BID) | INTRAVENOUS | Status: DC

## 2024-03-11 NOTE — Progress Notes (Signed)
" ° °  [address], [place] [address]             [phone]     Subjective: Tired, hasn't eaten breakfast yet- too early  Objective: Vital signs in last 24 hours: Temp:  [97.3 F (36.3 C)-98.4 F (36.9 C)] 98.4 F (36.9 C) (01/06 0500) Pulse Rate:  [70-95] 79 (01/06 0500) Resp:  [12-96] 17 (01/06 0500) BP: (56-119)/(37-90) 97/62 (01/06 0500) SpO2:  [94 %-100 %] 98 % (01/06 0500) Weight:  [61 kg-62 kg] 61 kg (01/05 1720)  Hemodynamic parameters for last 24 hours:    Intake/Output from previous day: No intake/output data recorded. Intake/Output this shift: No intake/output data recorded.  General appearance: alert, cooperative, and no distress Heart: regular rate and rhythm  Lab Results: No results for input(s): WBC, HGB, HCT, PLT in the last 72 hours. BMET:  Recent Labs    03/09/24 0435 03/10/24 0658  NA 133* 133*  K 4.2 4.3  CL 92* 93*  CO2 23 23  GLUCOSE 162* 166*  BUN 108* 139*  CREATININE 3.51* 3.68*  CALCIUM  9.7 9.6    PT/INR: No results for input(s): LABPROT, INR in the last 72 hours. ABG    Component Value Date/Time   PHART 7.340 (L) 02/13/2024 1618   HCO3 18.0 (L) 02/13/2024 1618   TCO2 19 (L) 02/13/2024 1618   ACIDBASEDEF 7.0 (H) 02/13/2024 1618   O2SAT 62 03/07/2024 0503   CBG (last 3)  Recent Labs    03/10/24 1943 03/10/24 2119 03/11/24 0627  GLUCAP 150* 136* 200*    Assessment/Plan: Looks a little more animated today  Hopefully Po intake improves   LOS: 1 day    Tracy Baker 03/11/2024   "

## 2024-03-11 NOTE — Evaluation (Signed)
 Speech Language Pathology Assessment and Plan  Patient Details  Name: Tracy Baker MRN: [id number] Date of Birth: Sep 21, 1955  SLP Diagnosis:  Rehab Potential:  ELOS:   Today's Date: 03/11/2024 SLP Individual Time: 8697-8641 SLP Individual Time Calculation (min): 56 min  Hospital Problem: Principal Problem:   Debility  Past Medical History:  Past Medical History:  Diagnosis Date   Anxiety    Candidemia (HCC) 12/2023   Nephrolithiasis    lithotripsy 12/14/23 w/stent   Pyelonephritis 12/2023   Thyroid  disease    Vertigo    Past Surgical History:  Past Surgical History:  Procedure Laterality Date   APPENDECTOMY     AUGMENTATION MAMMAPLASTY Bilateral 1981   removed in 2006   BREAST IMPLANT REMOVAL Bilateral 2007   CARDIOVERSION N/A 01/22/2024   Procedure: CARDIOVERSION;  Surgeon: Rolan Ezra RAMAN, MD;  Location: [facility] INVASIVE CV LAB;  Service: Cardiovascular;  Laterality: N/A;   CHOLECYSTECTOMY  2010   COLONOSCOPY WITH PROPOFOL  N/A 06/30/2021   Procedure: COLONOSCOPY WITH PROPOFOL ;  Surgeon: Jinny Carmine, MD;  Location: [facility] ENDOSCOPY;  Service: Endoscopy;  Laterality: N/A;   CONTROL OF EPISTAXIS  02/04/2024   ECMO CANNULATION N/A 02/09/2024   Procedure: ECMO CANNULATION;  Surgeon: Zenaida Morene PARAS, MD;  Location: [facility] INVASIVE CV LAB;  Service: Cardiovascular;  Laterality: N/A;   INTRAOPERATIVE TRANSESOPHAGEAL ECHOCARDIOGRAM N/A 01/10/2024   Procedure: ECHOCARDIOGRAM, TRANSESOPHAGEAL, INTRAOPERATIVE;  Surgeon: Kerrin Elspeth BROCKS, MD;  Location: [facility] OR;  Service: Open Heart Surgery;  Laterality: N/A;   INTRAOPERATIVE TRANSESOPHAGEAL ECHOCARDIOGRAM N/A 02/08/2024   Procedure: ECHOCARDIOGRAM, TRANSESOPHAGEAL, INTRAOPERATIVE;  Surgeon: Kerrin Elspeth BROCKS, MD;  Location: [facility] OR;  Service: Open Heart Surgery;  Laterality: N/A;   IR TUNNELED CENTRAL VENOUS CATH PLC W IMG  02/25/2024   LAPAROSCOPIC TOTAL HYSTERECTOMY  2006   MITRAL VALVE REPAIR N/A 01/10/2024   Procedure: REPAIR,  MITRAL VALVE;  Surgeon: Kerrin Elspeth BROCKS, MD;  Location: [facility] OR;  Service: Open Heart Surgery;  Laterality: N/A;   MITRAL VALVE REPLACEMENT N/A 02/08/2024   Procedure: REPLACEMENT, MITRAL VALVE WITH MITRIS RESILIA MITRAL VALVE 27mm;  Surgeon: Kerrin Elspeth BROCKS, MD;  Location: [facility] OR;  Service: Open Heart Surgery;  Laterality: N/A;   REDO STERNOTOMY N/A 02/08/2024   Procedure: REDO STERNOTOMY;  Surgeon: Kerrin Elspeth BROCKS, MD;  Location: [facility] OR;  Service: Open Heart Surgery;  Laterality: N/A;   RIGHT HEART CATH N/A 02/05/2024   Procedure: RIGHT HEART CATH;  Surgeon: Zenaida Morene PARAS, MD;  Location: [facility] INVASIVE CV LAB;  Service: Cardiovascular;  Laterality: N/A;   RIGHT/LEFT HEART CATH AND CORONARY ANGIOGRAPHY N/A 01/04/2024   Procedure: RIGHT/LEFT HEART CATH AND CORONARY ANGIOGRAPHY;  Surgeon: Verlin Lonni BIRCH, MD;  Location: [facility] INVASIVE CV LAB;  Service: Cardiovascular;  Laterality: N/A;   TOOTH EXTRACTION N/A 01/07/2024   Procedure: DENTAL RESTORATION/EXTRACTIONS;  Surgeon: Sheryle Hamilton, DMD;  Location: [facility] OR;  Service: Oral Surgery;  Laterality: N/A;  EXTRACTION OF TEETH #2, #6, #7, #16, #29   TRANSESOPHAGEAL ECHOCARDIOGRAM (CATH LAB) N/A 01/01/2024   Procedure: TRANSESOPHAGEAL ECHOCARDIOGRAM;  Surgeon: Lonni Slain, MD;  Location: [facility] INVASIVE CV LAB;  Service: Cardiovascular;  Laterality: N/A;   TRANSESOPHAGEAL ECHOCARDIOGRAM (CATH LAB) N/A 01/22/2024   Procedure: TRANSESOPHAGEAL ECHOCARDIOGRAM;  Surgeon: Rolan Ezra RAMAN, MD;  Location: [facility] INVASIVE CV LAB;  Service: Cardiovascular;  Laterality: N/A;    Assessment / Plan / Recommendation Clinical Impression   Tracy Baker is a 69 year old female with history of thyroid  dx, anxiety, nephrolithiasis s/p lithotripsy w/ ureteral  stent at [facility] 12/14/23 which were removed on 10/14 as patient going OOT to attend wedding in [facility]. While in [place], [name], she developed pyelopheritis with candida bacteremia due to UTI treated with  IV fluconazole  at [facility]. She signed out AMA to return for treatment closer to home and presented to [facility] 12/29/23.  Dr. Fleeta Rothman consulted  for input on persistent candemiaa as repeat [facility] 10/20 still positive for candida albicans and recommended change to miafungin, repeat TTE as well as ophthalmology consult. Opth consult by Dr. Mike Fleeta was negative for fugal endopthalmitis. TEE showed very small MV vegetation and Dr. Kerrin recommended assessment of coronaries as well as dental evaluation prior to surgery.   She underwent cardiac cath by Dr. Verlin 10/31 showed no evidence of CAD and normal left/right heart pressures.  She underwent  extraction of multiple non-restorable teeth due to dental caries by Dr. Sheryle and underwent repair of mitral valve on 01/10/24 with post cardiotomy shock with A fib managed with amiodarone  gtt as well as fevers and treated with broad spectrum antibiotics due to concerns of aspiration PNA.     She developed  epistaxis in setting of ASA and Eliquis  on 11/17 requiring d/c of ASA but had recurrent episode 12/01 treated nasal endoscopy and packing by Dr. Penne Croak.   She continued to have worsening of heart failure with inability to wean pressors, found to have severe mitral regurgitation and was taken back to OR on 02/08/24 for Redo median sternotomy with MVR w/Mitris Resilia tissue valve. Post op RV failure required ECMO support 12/06- 12/08. She continued to have worsening of renal status. Dr. Geralynn felt that AKI due due to contrast nephropathy with fluid overload despite high doses of IV diuretics and patient started on  CRRT 12/13.  She tolerated extubation by 12/14 and transitioned to HD. She failed attempts at intermittent HD with IV diuretic challenge.  TDC placed on 12/22 and has been transitioned to HD MWF.    Po intake has been poor in part due to dry mouth and intermittent issues with nausea and continues on tube feeds with additional oral nutritional  supplements. She has declined PEG tube and family reported that at baseline she ate like a bird.  Megace  added to stimulate appetite and tube feeds being weaned off--currently at 35 cc/hr at nights.  WOC following for input on stage 3 buttocks DTI as well as rash/dermatitis.  Pt admitted to CIR on 03/10/24.  Cognitive/ Linguistic: COGNISTAT administered with pt scoring WNL on all subtest. Informally, pt able to recall participation in PT and OT sessions earlier in day and some of recent medical hx. She demonstrated appropriate awareness of limitations. No skilled intervention warranted at this time.   Swallowing: Oral mechanism exam completed revealing missing bilateral upper and lower molars, decreased lingual rate and strength. Bedside Swallow Evaluation completed with trials of thin liquid and mixed consistency solids. She administered all trials. Pt without s/s aspiration of thin liquid via straw sips. She consumed mixed consistency solids with decreased mastication. Pt reports some difficulty with mastication of solids given recent missing molars. She reports poor appetite along with choosing softer foods due to missing dentition. She denies s/s aspiration and reports limited fatigue during meals however, pt noted to only consume very small amounts. No skilled intervention warranted at this time. Recommend regular solids in order to provided for optimal choices, thin liquids, and meds whole with liquid.   No further skilled intervention warranted at this time.  Skilled Therapeutic Interventions          BSE, informal assessment measures, and COGNISTAT administered. Please see full report for additional details.      SLP Assessment  Patient does not need any further Speech Lanaguage Pathology Services    Recommendations  SLP Diet Recommendations: Age appropriate regular solids;Thin Medication Administration: Whole meds with liquid Supervision: Patient able to self feed Postural Changes and/or  Swallow Maneuvers: Seated upright 90 degrees Oral Care Recommendations: Oral care BID Patient destination: Home Follow up Recommendations: None Equipment Recommended: None recommended by SLP    SLP Frequency 1 to 3 out of 7 days   SLP Duration  SLP Intensity  SLP Treatment/Interventions 12-14 days  Minumum of 1-2 x/day, 30 to 90 minutes  Dysphagia/aspiration precaution training;Patient/family education;Internal/external aids    Pain Pain Assessment Pain Scale: 0-10 Pain Score: 5  Pain Type: Acute pain Pain Location: Foot Pain Orientation: Left Pain Descriptors / Indicators: Sore  Prior Functioning Cognitive/Linguistic Baseline: Within functional limits Type of Home: Apartment  Lives With: Alone Available Help at Discharge: Family;Available 24 hours/day Vocation: Part time employment  SLP Evaluation Cognition Arousal/Alertness: Awake/alert Orientation Level: Oriented X4 Year: 2026 Month: January Day of Week: Correct Attention: Focused;Sustained Focused Attention: Appears intact Sustained Attention: Appears intact Memory: Appears intact Awareness: Impaired Problem Solving: Impaired Problem Solving Impairment: Verbal complex;Functional complex Executive Function: Self Correcting;Organizing Organizing: Appears intact Safety/Judgment: Appears intact  Comprehension Auditory Comprehension Overall Auditory Comprehension: Appears within functional limits for tasks assessed Expression Expression Primary Mode of Expression: Verbal Verbal Expression Overall Verbal Expression: Appears within functional limits for tasks assessed Written Expression Dominant Hand: Left Oral Motor Oral Motor/Sensory Function Overall Oral Motor/Sensory Function: Within functional limits Motor Speech Overall Motor Speech: Appears within functional limits for tasks assessed  Care Tool Care Tool Cognition Ability to hear (with hearing aid or hearing appliances if normally used Ability  to hear (with hearing aid or hearing appliances if normally used): 0. Adequate - no difficulty in normal conservation, social interaction, listening to TV   Expression of Ideas and Wants Expression of Ideas and Wants: 4. Without difficulty (complex and basic) - expresses complex messages without difficulty and with speech that is clear and easy to understand   Understanding Verbal and Non-Verbal Content Understanding Verbal and Non-Verbal Content: 4. Understands (complex and basic) - clear comprehension without cues or repetitions  Memory/Recall Ability Memory/Recall Ability : Current season;That he or she is in a hospital/hospital unit   PMSV Assessment  PMSV Trial    Bedside Swallowing Assessment General History of Recent Intubation: Yes (across two recent intubations, 11/6-11/7 and 12/5-12/15) Total duration of intubation (days): 11 days Behavior/Cognition: Alert;Cooperative Oral Cavity - Dentition: Missing dentition Self-Feeding Abilities: Able to feed self;Needs assist Patient Positioning: Upright in bed Baseline Vocal Quality: Hoarse;Low vocal intensity Volitional Cough: Weak Volitional Swallow: Able to elicit  Oral Care Assessment Oral Assessment  (WDL): Exceptions to WDL Lips: Symmetrical Teeth: Missing (Comment) Mucous Membrane(s): Pink;Moist Saliva: Moist, saliva free flowing Level of Consciousness: Alert Is patient on any of following O2 devices?: None of the above Nutritional status: No high risk factors Oral Assessment Risk : Low Risk Ice Chips Ice chips: Not tested Presentation: Spoon Thin Liquid Thin Liquid: Within functional limits Presentation: Straw Nectar Thick Nectar Thick Liquid: Not tested Honey Thick Honey Thick Liquid: Not tested Puree Puree: Not tested Solid Solid: Within functional limits BSE Assessment Risk for Aspiration Impact on safety and function: Mild aspiration risk Other Related Risk Factors: Prolonged  intubation;Deconditioning;Decreased respiratory status  Short Term Goals: Week 1:    Refer to Care Plan for Long Term Goals  Recommendations for other services: None   Discharge Criteria: Patient will be discharged from SLP if patient refuses treatment 3 consecutive times without medical reason, if treatment goals not met, if there is a change in medical status, if patient makes no progress towards goals or if patient is discharged from hospital.  The above assessment, treatment plan, treatment alternatives and goals were discussed and mutually agreed upon: by patient  Joane GORMAN Fuss 03/11/2024, 4:21 PM

## 2024-03-11 NOTE — Progress Notes (Signed)
 "    Babs Arthea DASEN, MD  Physician Physical Medicine and Rehabilitation   PMR Pre-admission    Signed   Date of Service: 03/07/2024  5:57 PM  Related encounter: ED to Hosp-Admission (Discharged) from 12/28/2023 in [facility] CV PROGRESSIVE CARE   Signed     Expand All Collapse All  Show:Clear all [x] Written[x] Templated[] Copied  Added by: [x] Alison Heron MATSU, RN[x] Conetta, Kristyn H[x] Babs Arthea DASEN, MD  [] Hover for details PMR Admission Coordinator Pre-Admission Assessment   Patient: Tracy Baker is an 69 y.o., female MRN: [id number] DOB: 11-25-1955 Height: 5' 4 (162.6 cm) Weight: (S) 61.6 kg (Bed Scale)   Insurance Information HMO:     PPO: yes     PCP:      IPA:      80/20:      OTHER:  PRIMARY: UHC Medicare      Policy#: [id number] ; Medicare [id number]     Subscriber: pt      Phone#: [phone] option 3     Fax#: [phone] Pre-Cert#:  Auth for CIR # [id number] from 03/10/24-03/17/24  for admit 03/10/24.  Updates to fax listed above.    Benefits:  Phone #: [phone]     Name: 12/31 Eff. Date: 03/06/24 active    Deduct: none      Out of Pocket Max: $4200    Life Max: none CIR: $395 co pay per day days 1 until 6      SNF: no co pay days 1 until 20; $ 218 co pay per day days 21 until 100 Outpatient: $20 per visit     Co-Pay:  Home Health: 100%      Co-Pay:  DME: 80%     Co-Pay: 20% Providers: in network      [employer] Information Summary for patients in Inpatient Rehabilitation Facilities with attached Privacy Act Statement-Health Care Records was provided and verbally reviewed with: Patient and Family   Emergency Contact Information Contact Information       Name Relation Home Work Mobile    Placide,[name] Daughter [phone]   [phone]    Jeraldine, [place]     [phone]    Austin,Brandi Daughter     [phone]         Other Contacts   None on File      Current Medical History  Patient Admitting Diagnosis:  debility s/p valve replacement and AKI   History of Present Illness: 69 yo female with history of nephrolithiasis otherwise healthy presents on 12/28/23 by private vehicle after leaving [facility] in [place], [name] where she was being treated for C. Albicans candidemia. Prior to that had underwent ureteral stent placement and lithotripsy at [facility] 12/14/23, then stent removed 10/14. She developed fever, chills and malaise. Went to [place] for a wedding.    Admitted with candidemia with mitral valve endocarditis. Underwent mitral valve repair on 01/10/24. Placed on micafungin  for ID for a treatment of 6 weeks. Heart failure team consulted for aggressive diuresis. Postop course complicated with post cardiotomy shock, junctional brady cardia , and afib with RVR.    Postop course complicated with severe mitral valve regurgitation  and underwent redo sternotomy and mitral valve replacement on 02/08/24. 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 Initially on CRRT and began IHD on 02/16/24. Extubated on 12/15.  Currently on MWF schedule. Left IJ TDC placed on 12/22 with IR. Remains on antifungal therapy. Managing volume with hemodialysis.. On oral amiodarone  and eliquis  for prior atrial fibrillation. Dobutamine  weaned off.  Protein -calorie malnutrition. On tube feeds. Remeron  to stimulate appetite. Does not want PEG.    Patient's medical record from [facility] has been reviewed by the rehabilitation admission coordinator and physician.   Past Medical History      Past Medical History:  Diagnosis Date   Medical history non-contributory     Thyroid  disease     Vertigo          Has the patient had major surgery during 100 days prior to admission? Yes   Family History   family history includes Diabetes in her father; Hypertension in her mother.   Current Medications [Current Medications]  [Current Medications]    Current Facility-Administered Medications:    acetaminophen   (TYLENOL ) tablet 1,000 mg, 1,000 mg, Oral, Q6H PRN, Raguel Benders S, PA-C   albuterol  (PROVENTIL ) (2.5 MG/3ML) 0.083% nebulizer solution 2.5 mg, 2.5 mg, Nebulization, Q4H PRN, Su, Benders RAMAN, MD, 2.5 mg at 02/13/24 1053   alteplase  (CATHFLO ACTIVASE ) injection 2 mg, 2 mg, Intracatheter, Once PRN, Su, Benders RAMAN, MD   amiodarone  (PACERONE ) tablet 200 mg, 200 mg, Oral, Daily, Raguel Benders S, PA-C, 200 mg at 03/07/24 1558   anticoagulant sodium citrate  solution 5 mL, 5 mL, Intracatheter, PRN, Su, Benders RAMAN, MD   antiseptic oral rinse (BIOTENE) solution 15 mL, 15 mL, Mouth Rinse, PRN, Cleatus Delayne GAILS, MD, 15 mL at 02/29/24 1408   apixaban  (ELIQUIS ) tablet 5 mg, 5 mg, Oral, BID, Sherryll Suzen SQUIBB, RPH, 5 mg at 03/06/24 2132   clonazePAM  (KLONOPIN ) tablet 0.5 mg, 0.5 mg, Oral, QHS, Chambers, Bailey S, PA-C, 0.5 mg at 03/06/24 2132   feeding supplement (ENSURE PLUS HIGH PROTEIN) liquid 237 mL, 237 mL, Oral, TID BM, Su, Bailey S, MD, 237 mL at 03/06/24 2000   feeding supplement (PROSource TF20) liquid 60 mL, 60 mL, Per Tube, BID, Kerrin Elspeth BROCKS, MD, 60 mL at 03/06/24 2132   feeding supplement (VITAL 1.5 CAL) liquid 1,000 mL, 1,000 mL, Per Tube, Q24H, Kerrin Elspeth BROCKS, MD, 1,000 mL at 03/06/24 1815   fluconazole  (DIFLUCAN ) IVPB 400 mg, 400 mg, Intravenous, Q24H, Kerrin Elspeth BROCKS, MD, Last Rate: 100 mL/hr at 03/06/24 1809, 400 mg at 03/06/24 1809   Gerhardt's butt cream, , Topical, BID, Daniel Benders RAMAN, MD, Given at 03/06/24 0954   heparin  injection 1,000 Units, 1,000 Units, Intracatheter, PRN, Daniel Benders RAMAN, MD, 1,000 Units at 03/07/24 1325   insulin  aspart (novoLOG ) injection 0-9 Units, 0-9 Units, Subcutaneous, Q4H, Daniel Benders RAMAN, MD, 2 Units at 03/07/24 0457   insulin  glargine (LANTUS ) injection 15 Units, 15 Units, Subcutaneous, Daily, Su, Bailey S, MD, 15 Units at 03/07/24 1558   lidocaine  (PF) (XYLOCAINE ) 1 % injection 5 mL, 5 mL, Intradermal, PRN, Su, Benders RAMAN, MD   lidocaine -prilocaine   (EMLA ) cream 1 Application, 1 Application, Topical, PRN, Su, Benders RAMAN, MD   magic mouthwash, 5 mL, Oral, TID PRN, Raguel Benders S, PA-C   megestrol  (MEGACE ) tablet 40 mg, 40 mg, Oral, BID, Hendrickson, Steven C, MD, 40 mg at 03/06/24 2133   mirtazapine  (REMERON ) tablet 30 mg, 30 mg, Oral, QHS, Raguel Benders RAMAN, PA-C, 30 mg at 03/06/24 2132   Oral care mouth rinse, 15 mL, Mouth Rinse, 4 times per day, Su, Bailey S, MD, 15 mL at 03/07/24 1559   Oral care mouth rinse, 15 mL, Mouth Rinse, PRN, Su, Benders RAMAN, MD   pantoprazole  (PROTONIX ) EC tablet 40 mg, 40 mg, Oral, Daily, Daniel Benders RAMAN, MD, 40 mg at 03/07/24  1559   pentafluoroprop-tetrafluoroeth (GEBAUERS) aerosol 1 Application, 1 Application, Topical, PRN, Su, Con RAMAN, MD   polyethylene glycol (MIRALAX  / GLYCOLAX ) packet 17 g, 17 g, Per Tube, Daily PRN, Su, Con RAMAN, MD   promethazine  (PHENERGAN ) 6.25 mg/NS 50 mL IVPB, 6.25 mg, Intravenous, Q6H PRN, Daniel Con RAMAN, MD, Stopped at 02/28/24 2315   QUEtiapine  (SEROQUEL ) tablet 25 mg, 25 mg, Oral, QHS, Raguel Con RAMAN, PA-C, 25 mg at 03/06/24 2131   sertraline  (ZOLOFT ) tablet 25 mg, 25 mg, Oral, QHS, Raguel Con RAMAN, PA-C, 25 mg at 03/06/24 2131   simethicone  (MYLICON) chewable tablet 80 mg, 80 mg, Oral, QID PRN, Raguel Con RAMAN, PA-C   sodium chloride  flush (NS) 0.9 % injection 10-40 mL, 10-40 mL, Intracatheter, Q12H, Su, Bailey S, MD, 10 mL at 03/07/24 0800   sodium chloride  flush (NS) 0.9 % injection 10-40 mL, 10-40 mL, Intracatheter, PRN, Su, Con RAMAN, MD    Patients Current Diet:  Diet Order                  Diet regular Room service appropriate? Yes; Fluid consistency: Thin  Diet effective now                       Precautions / Restrictions Precautions Precautions: Fall, Sternal Precaution Booklet Issued: No Precaution/Restrictions Comments: cortak Restrictions Weight Bearing Restrictions Per Provider Order: No RUE Weight Bearing Per Provider Order: Non weight  bearing RUE Partial Weight Bearing Percentage or Pounds: 5 LUE Weight Bearing Per Provider Order: Non weight bearing LUE Partial Weight Bearing Percentage or Pounds: 5 Other Position/Activity Restrictions: Cardiac sternal precautions    Has the patient had 2 or more falls or a fall with injury in the past year? No   Prior Activity Level Community (5-7x/wk): Independent, working as conservation officer, nature, driving   Prior Functional Level Self Care: Did the patient need help bathing, dressing, using the toilet or eating? Independent   Indoor Mobility: Did the patient need assistance with walking from room to room (with or without device)? Independent   Stairs: Did the patient need assistance with internal or external stairs (with or without device)? Independent   Functional Cognition: Did the patient need help planning regular tasks such as shopping or remembering to take medications? Independent   Patient Information Are you of Hispanic, Latino/a,or Spanish origin?: A. No, not of Hispanic, Latino/a, or Spanish origin What is your race?: A. White Do you need or want an interpreter to communicate with a doctor or health care staff?: 0. No   Patient's Response To:  Health Literacy and Transportation Is the patient able to respond to health literacy and transportation needs?: Yes Health Literacy - How often do you need to have someone help you when you read instructions, pamphlets, or other written material from your doctor or pharmacy?: Never In the past 12 months, has lack of transportation kept you from medical appointments or from getting medications?: No In the past 12 months, has lack of transportation kept you from meetings, work, or from getting things needed for daily living?: No   Home Assistive Devices / Equipment Home Equipment: None   Prior Device Use: Indicate devices/aids used by the patient prior to current illness, exacerbation or injury? None of the above   Current Functional  Level Cognition   Orientation Level: Oriented X4    Extremity Assessment (includes Sensation/Coordination)   Upper Extremity Assessment: Generalized weakness  Lower Extremity Assessment: Defer to PT evaluation RLE Deficits /  Details: no active movement; did respond/withdraw to noxious stimuli; no clonus noted; ROM limited by pitting edema throughout LLE Deficits / Details: spontaneous slight knee extension movement noted; did respond/withdraw to noxious stimuli; no clonus noted; ROM limited by pitting edema throughout     ADLs   Overall ADL's : Needs assistance/impaired Eating/Feeding: Minimal assistance, Sitting Eating/Feeding Details (indicate cue type and reason): ice chips Grooming: Contact guard assist, Standing Grooming Details (indicate cue type and reason): standing at sink Upper Body Bathing: Maximal assistance Upper Body Bathing Details (indicate cue type and reason): for back Lower Body Bathing: Total assistance, Bed level Lower Body Bathing Details (indicate cue type and reason): Cues for adhering to precautions Upper Body Dressing : Total assistance, Bed level Upper Body Dressing Details (indicate cue type and reason): verbally reviewed technique for UB dressing Lower Body Dressing: Moderate assistance Lower Body Dressing Details (indicate cue type and reason): mod A to hold BLE in figure 4 Toilet Transfer: Minimal assistance, +2 for physical assistance, Ambulation, Rolling walker (2 wheels) Toilet Transfer Details (indicate cue type and reason): with close chair follow Toileting- Clothing Manipulation and Hygiene: Total assistance, +2 for physical assistance, +2 for safety/equipment, Sit to/from stand Toileting - Clothing Manipulation Details (indicate cue type and reason): Total +2 assist Functional mobility during ADLs: Minimal assistance, +2 for physical assistance, Rolling walker (2 wheels) General ADL Comments: dependent at this time     Mobility   Overal bed  mobility: Needs Assistance Bed Mobility: Rolling, Sidelying to Sit, Sit to Sidelying Rolling: Min assist Sidelying to sit: Mod assist Supine to sit: Total assist, +2 for physical assistance, +2 for safety/equipment, HOB elevated Sit to supine: Total assist, +2 for physical assistance Sit to sidelying: Min assist General bed mobility comments: with increased time and tactile cues, pt able to complete more movement with LE, dependent on assist at trunk     Transfers   Overall transfer level: Needs assistance Equipment used: Rolling walker (2 wheels) Transfers: Sit to/from Stand, Bed to chair/wheelchair/BSC Sit to Stand: Min assist, +2 physical assistance Bed to/from chair/wheelchair/BSC transfer type:: Step pivot Stand pivot transfers: Max assist, +2 safety/equipment Step pivot transfers: Contact guard assist General transfer comment: completed x4 in session with minA to rise, improved power up with hands on knees. max cues for sequencing sternal precautions     Ambulation / Gait / Stairs / Wheelchair Mobility   Ambulation/Gait Ambulation/Gait assistance: +2 safety/equipment, Min assist Gait Distance (Feet): 32 Feet (57ft + 32ft + 87ft) Assistive device: Rolling walker (2 wheels) Gait Pattern/deviations: Step-to pattern, Decreased stride length, Drifts right/left, Trunk flexed, Narrow base of support, Scissoring General Gait Details: pt with small steps and dependent on BUE support on walker. assist to guide walker, encourage trunk upright, and stride length. progressively decreased stride length in LLE and more frequent near-scissoring steps with fatigue Gait velocity: decreased Gait velocity interpretation: <1.31 ft/sec, indicative of household ambulator     Posture / Balance Dynamic Sitting Balance Sitting balance - Comments: CGA to minA to maintain sitting balance, mild posterior lean Balance Overall balance assessment: Needs assistance Sitting-balance support: No upper extremity  supported, Feet unsupported Sitting balance-Leahy Scale: Good Sitting balance - Comments: CGA to minA to maintain sitting balance, mild posterior lean Postural control: Posterior lean Standing balance support: Bilateral upper extremity supported, During functional activity Standing balance-Leahy Scale: Fair Standing balance comment: can tolerate static stance without UE support, BUE support for gait     Special considerations/life events  Pressure ulcer right  buttocks [place] for Tube feeds New to hemodialysis this admission    Previous Home Environment  Living Arrangements: Alone  Lives With: Alone Available Help at Discharge: Family, Available 24 hours/day (dtr, [name] Jarvis, and son along with Aunt from [place] can provide 24/7) Type of Home: House Home Layout: One level Home Access: Stairs to enter Entrance Stairs-Rails: Left Entrance Stairs-Number of Steps: 5 Bathroom Shower/Tub: Health Visitor: Standard Bathroom Accessibility: Yes How Accessible: Accessible via walker Home Care Services: No   Discharge Living Setting Plans for Discharge Living Setting: Patient's home, House Type of Home at Discharge: House Discharge Home Layout: One level Discharge Home Access: Stairs to enter Entrance Stairs-Rails: Left Entrance Stairs-Number of Steps: 5 Discharge Bathroom Shower/Tub: Walk-in shower Discharge Bathroom Toilet: Standard Discharge Bathroom Accessibility: Yes How Accessible: Accessible via walker Does the patient have any problems obtaining your medications?: No   Social/Family/Support Systems Patient Roles: Parent Contact Information: [name], Daughter , main contact Anticipated Caregiver: [name] Jarvis and son, pt's Sister from [place] to come asisst at discharge Anticipated Caregiver's Contact Information: see contacts Ability/Limitations of Caregiver: children work but will arrange schedules Caregiver Availability: 24/7 Discharge Plan Discussed  with Primary Caregiver: Yes Is Caregiver In Agreement with Plan?: Yes Does Caregiver/Family have Issues with Lodging/Transportation while Pt is in Rehab?: No   Goals Patient/Family Goal for Rehab: supervision PT, OT and SLP Expected length of stay: ELOS 10 to 14 days Additional Information: Treatment for fungemia to be po fluconazole  at discharge Pt/Family Agrees to Admission and willing to participate: Yes Program Orientation Provided & Reviewed with Pt/Caregiver Including Roles  & Responsibilities: Yes  Barriers to Discharge: Insurance for SNF coverage   Decrease burden of Care through IP rehab admission: n/a   Possible need for SNF placement upon discharge: not anticipated   Patient Condition: I have reviewed medical records from [facility], spoken with  patient and daughter. I met with patient at the bedside and discussed via phone for inpatient rehabilitation assessment.  Patient will benefit from ongoing PT, OT, and SLP, can actively participate in 3 hours of therapy a day 5 days of the week, and can make measurable gains during the admission.  Patient will also benefit from the coordinated team approach during an Inpatient Acute Rehabilitation admission.  The patient will receive intensive therapy as well as Rehabilitation physician, nursing, social worker, and care management interventions.  Due to bladder management, bowel management, safety, skin/wound care, disease management, medication administration, pain management, and patient education the patient requires 24 hour a day rehabilitation nursing.  The patient is currently min/mod A with mobility and basic ADLs.  Discharge setting and therapy post discharge at home with home health is anticipated.  Patient has agreed to participate in the Acute Inpatient Rehabilitation Program and will admit today.   Preadmission Screen Completed By:  Alison Heron Lot, RN MSN1/04/2024 5:57 PM, Updates provided by Kristyn Conetta, PT  03/10/24 ______________________________________________________________________   Discussed status with Dr. Babs on 03/10/24 at 10:00 and received approval for admission today.   Admission Coordinator:  Alison Heron Lot, RN MSN, updates provided by Cosmo Platter, PT time 11:00am/Date 03/10/24    Assessment/Plan: Diagnosis: debility Does the need for close, 24 hr/day Medical supervision in concert with the patient's rehab needs make it unreasonable for this patient to be served in a less intensive setting? Yes Co-Morbidities requiring supervision/potential complications: malnutrition, wound care, fungal esophagitis Due to bladder management, bowel management, safety, skin/wound care, disease management, medication administration, pain  management, and patient education, does the patient require 24 hr/day rehab nursing? Yes Does the patient require coordinated care of a physician, rehab nurse, PT, OT, and SLP to address physical and functional deficits in the context of the above medical diagnosis(es)? Yes Addressing deficits in the following areas: balance, endurance, locomotion, strength, transferring, bowel/bladder control, bathing, dressing, feeding, grooming, toileting, and psychosocial support Can the patient actively participate in an intensive therapy program of at least 3 hrs of therapy 5 days a week? Yes The potential for patient to make measurable gains while on inpatient rehab is excellent Anticipated functional outcomes upon discharge from inpatient rehab: supervision PT, supervision OT, supervision SLP Estimated rehab length of stay to reach the above functional goals is: 10-14 day Anticipated discharge destination: Home 10. Overall Rehab/Functional Prognosis: excellent     MD Signature: Arthea IVAR Gunther, MD, [facility] Physical Medicine & Rehabilitation Medical Director Rehabilitation Services 03/10/2024              Revision History  Date/Time User Provider  Type Action  03/10/2024 12:03 PM Gunther Arthea DASEN, MD Physician Sign  03/10/2024 11:12 AM Burnell Cosmo DEL Rehab Admission Coordinator Share  03/07/2024  5:57 PM Alison Heron MATSU, RN Rehab Admission Coordinator Share  03/07/2024  1:09 PM Alison Heron MATSU, RN Rehab Admission Coordinator Share  03/05/2024  1:56 PM Alison Heron MATSU, RN Rehab Admission Coordinator Share  03/04/2024  4:31 PM Alison Heron MATSU, RN Rehab Admission Coordinator Share   View Details Report      Routing History  "

## 2024-03-11 NOTE — Progress Notes (Signed)
 "                                                        PROGRESS NOTE   Subjective/Complaints: No complaints this morning Currently receiving personal care from OT Hope, having BM into bedpan Appreciate cardiothoracic surgery eval this morning  ROS:   Objective:   No results found. No results for input(s): WBC, HGB, HCT, PLT in the last 72 hours. Recent Labs    03/09/24 0435 03/10/24 0658  NA 133* 133*  K 4.2 4.3  CL 92* 93*  CO2 23 23  GLUCOSE 162* 166*  BUN 108* 139*  CREATININE 3.51* 3.68*  CALCIUM  9.7 9.6    Intake/Output Summary (Last 24 hours) at 03/11/2024 0930 Last data filed at 03/11/2024 0700 Gross per 24 hour  Intake 100 ml  Output --  Net 100 ml     Wound 02/12/24 1800 Pressure Injury Buttocks Right;Medial Stage 2 -  Partial thickness loss of dermis presenting as a shallow open injury with a red, pink wound bed without slough. (Active)    Physical Exam: Vital Signs Blood pressure 97/62, pulse 79, temperature 98.4 F (36.9 C), temperature source Oral, resp. rate 17, height 5' 4 (1.626 m), weight 62 kg, SpO2 98%. Gen: no distress, normal appearing HEENT: oral mucosa pink and moist, NCAT, +NGT Cardio: Reg rate Chest: normal effort, normal rate of breathing Abd: soft, non-distended Ext: no edema Psych: pleasant, normal affect Skin: intact Neuro: Alert, unable to examine further as she is having BM on bedpan   Assessment/Plan: 1. Functional deficits which require 3+ hours per day of interdisciplinary therapy in a comprehensive inpatient rehab setting. Physiatrist is providing close team supervision and 24 hour management of active medical problems listed below. Physiatrist and rehab team continue to assess barriers to discharge/monitor patient progress toward functional and medical goals  Care Tool:  Bathing    Body parts bathed by patient: Right arm, Left arm, Chest, Abdomen, Front perineal area, Face   Body parts bathed by helper:  Buttocks, Right upper leg, Left upper leg, Right lower leg, Left lower leg     Bathing assist Assist Level: Moderate Assistance - Patient 50 - 74%     Upper Body Dressing/Undressing Upper body dressing   What is the patient wearing?: Pull over shirt    Upper body assist Assist Level: Maximal Assistance - Patient 25 - 49%    Lower Body Dressing/Undressing Lower body dressing      What is the patient wearing?: Incontinence brief, Pants     Lower body assist Assist for lower body dressing: Total Assistance - Patient < 25%     Toileting Toileting    Toileting assist Assist for toileting: Total Assistance - Patient < 25%     Transfers Chair/bed transfer  Transfers assist           Locomotion Ambulation   Ambulation assist              Walk 10 feet activity   Assist           Walk 50 feet activity   Assist           Walk 150 feet activity   Assist           Walk 10 feet on uneven surface  activity  Assist           Wheelchair     Assist               Wheelchair 50 feet with 2 turns activity    Assist            Wheelchair 150 feet activity     Assist          Blood pressure 97/62, pulse 79, temperature 98.4 F (36.9 C), temperature source Oral, resp. rate 17, height 5' 4 (1.626 m), weight 62 kg, SpO2 98%.  Medical Problem List and Plan: 1. Functional deficits secondary to debility after prolonged hospital stay related to mitral valve vegetations and MV repair and ultimate replacement.             -patient may  shower             -ELOS/Goals: 10-14 days, supervision goals with PT, OT, SLP  Initial CIR evals today Continue Eliquis   2. Hypotension: medications reviewed and is on amiodarone  and clonazepam - will discuss with patient risks and benefits of benzodizapenes  3. Pain from HD: continue prn lidocaine    4. Insomnia: continue Remeron  and melatonin        5. Neuropsych/cognition:  This patient is capable of making decisions on her own behalf. 6. Skin/Wound Care: Air mattress overlay for pressure relief.             --continue Ensure and prosource             --add Vitamin C and Zinc to promote wound healing.  7. Fluids/Electrolytes/Nutrition: Strict I/O.  --getting 1010 cc w/TF and schedule ensure. NO FR needed per  nephrology.    -transition to oral diet as able. Pt feels that appetite is improving.  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             --F/u with ID 01/06?-->discussed with Dr. Overton who will follow up with for input.  9. 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  10. AKI with dialysis initiated 02/16/24: On HD MWF at the end of the day to help with tolerance of therapy             --Cr at baseline but no signs of recovery so far.              --CLIP on hold. Needs to be able to sit up in a chair for HD.  11. A fib: Monitor HR TID--on amiodarone  and eliquis .  12. Anemia of critical illness:Being monitored and stable.              --thrombocytopenia has resolved.  13. CODE STATUS: FULL CODE but NO CHEST COMPRESSIONS.              --nurse to place sign on door.     LOS: 1 days A FACE TO FACE EVALUATION WAS PERFORMED  Davanna He P Gillis Boardley 03/11/2024, 9:30 AM     "

## 2024-03-11 NOTE — Progress Notes (Signed)
 Physical Therapy Note  Patient Details  Name: Tracy Baker MRN: [id number] Date of Birth: 1955/10/31 Today's Date: 03/11/2024   PT attempted to work with pt to make up missed minutes to increase mobility this afternoon, however, pt refused due to foot pain.   Comer CHRISTELLA Levora Comer Levora, PT, DPT 03/11/2024, 2:46 PM

## 2024-03-11 NOTE — Plan of Care (Signed)
" °  Problem: RH Balance Goal: LTG Patient will maintain dynamic standing with ADLs (OT) Description: LTG:  Patient will maintain dynamic standing balance with assist during activities of daily living (OT)  Flowsheets (Taken 03/11/2024 1223) LTG: Pt will maintain dynamic standing balance during ADLs with: Supervision/Verbal cueing   Problem: Sit to Stand Goal: LTG:  Patient will perform sit to stand in prep for activites of daily living with assistance level (OT) Description: LTG:  Patient will perform sit to stand in prep for activites of daily living with assistance level (OT) Flowsheets (Taken 03/11/2024 1223) LTG: PT will perform sit to stand in prep for activites of daily living with assistance level: Supervision/Verbal cueing   Problem: RH Bathing Goal: LTG Patient will bathe all body parts with assist levels (OT) Description: LTG: Patient will bathe all body parts with assist levels (OT) Flowsheets (Taken 03/11/2024 1223) LTG: Pt will perform bathing with assistance level/cueing: Supervision/Verbal cueing   Problem: RH Dressing Goal: LTG Patient will perform upper body dressing (OT) Description: LTG Patient will perform upper body dressing with assist, with/without cues (OT). Flowsheets (Taken 03/11/2024 1223) LTG: Pt will perform upper body dressing with assistance level of: Set up assist Goal: LTG Patient will perform lower body dressing w/assist (OT) Description: LTG: Patient will perform lower body dressing with assist, with/without cues in positioning using equipment (OT) Flowsheets (Taken 03/11/2024 1223) LTG: Pt will perform lower body dressing with assistance level of: Supervision/Verbal cueing   Problem: RH Toileting Goal: LTG Patient will perform toileting task (3/3 steps) with assistance level (OT) Description: LTG: Patient will perform toileting task (3/3 steps) with assistance level (OT)  Flowsheets (Taken 03/11/2024 1223) LTG: Pt will perform toileting task (3/3 steps) with  assistance level: Supervision/Verbal cueing   Problem: RH Toilet Transfers Goal: LTG Patient will perform toilet transfers w/assist (OT) Description: LTG: Patient will perform toilet transfers with assist, with/without cues using equipment (OT) Flowsheets (Taken 03/11/2024 1223) LTG: Pt will perform toilet transfers with assistance level of: Supervision/Verbal cueing   Problem: RH Memory Goal: LTG Patient will demonstrate ability for day to day recall/carry over during activities of daily living with assistance level (OT) Description: LTG:  Patient will demonstrate ability for day to day recall/carry over during activities of daily living with assistance level (OT). Flowsheets (Taken 03/11/2024 1223) LTG:  Patient will demonstrate ability for day to day recall/carry over during activities of daily living with assistance level (OT): Supervision   "

## 2024-03-11 NOTE — Progress Notes (Signed)
 " Inpatient Rehabilitation Care Coordinator Assessment and Plan Patient Details  Name: Tracy Baker MRN: [id number] Date of Birth: 04/29/1955  Today's Date: 03/11/2024  Hospital Problems: Principal Problem:   Debility  Past Medical History:  Past Medical History:  Diagnosis Date   Anxiety    Candidemia (HCC) 12/2023   Nephrolithiasis    lithotripsy 12/14/23 w/stent   Pyelonephritis 12/2023   Thyroid  disease    Vertigo    Past Surgical History:  Past Surgical History:  Procedure Laterality Date   APPENDECTOMY     AUGMENTATION MAMMAPLASTY Bilateral 1981   removed in 2006   BREAST IMPLANT REMOVAL Bilateral 2007   CARDIOVERSION N/A 01/22/2024   Procedure: CARDIOVERSION;  Surgeon: Rolan Ezra RAMAN, MD;  Location: [facility] INVASIVE CV LAB;  Service: Cardiovascular;  Laterality: N/A;   CHOLECYSTECTOMY  2010   COLONOSCOPY WITH PROPOFOL  N/A 06/30/2021   Procedure: COLONOSCOPY WITH PROPOFOL ;  Surgeon: Jinny Carmine, MD;  Location: [facility] ENDOSCOPY;  Service: Endoscopy;  Laterality: N/A;   CONTROL OF EPISTAXIS  02/04/2024   ECMO CANNULATION N/A 02/09/2024   Procedure: ECMO CANNULATION;  Surgeon: Zenaida Morene PARAS, MD;  Location: [facility] INVASIVE CV LAB;  Service: Cardiovascular;  Laterality: N/A;   INTRAOPERATIVE TRANSESOPHAGEAL ECHOCARDIOGRAM N/A 01/10/2024   Procedure: ECHOCARDIOGRAM, TRANSESOPHAGEAL, INTRAOPERATIVE;  Surgeon: Kerrin Elspeth BROCKS, MD;  Location: [facility] OR;  Service: Open Heart Surgery;  Laterality: N/A;   INTRAOPERATIVE TRANSESOPHAGEAL ECHOCARDIOGRAM N/A 02/08/2024   Procedure: ECHOCARDIOGRAM, TRANSESOPHAGEAL, INTRAOPERATIVE;  Surgeon: Kerrin Elspeth BROCKS, MD;  Location: [facility] OR;  Service: Open Heart Surgery;  Laterality: N/A;   IR TUNNELED CENTRAL VENOUS CATH PLC W IMG  02/25/2024   LAPAROSCOPIC TOTAL HYSTERECTOMY  2006   MITRAL VALVE REPAIR N/A 01/10/2024   Procedure: REPAIR, MITRAL VALVE;  Surgeon: Kerrin Elspeth BROCKS, MD;  Location: [facility] OR;  Service: Open Heart Surgery;   Laterality: N/A;   MITRAL VALVE REPLACEMENT N/A 02/08/2024   Procedure: REPLACEMENT, MITRAL VALVE WITH MITRIS RESILIA MITRAL VALVE 27mm;  Surgeon: Kerrin Elspeth BROCKS, MD;  Location: [facility] OR;  Service: Open Heart Surgery;  Laterality: N/A;   REDO STERNOTOMY N/A 02/08/2024   Procedure: REDO STERNOTOMY;  Surgeon: Kerrin Elspeth BROCKS, MD;  Location: [facility] OR;  Service: Open Heart Surgery;  Laterality: N/A;   RIGHT HEART CATH N/A 02/05/2024   Procedure: RIGHT HEART CATH;  Surgeon: Zenaida Morene PARAS, MD;  Location: [facility] INVASIVE CV LAB;  Service: Cardiovascular;  Laterality: N/A;   RIGHT/LEFT HEART CATH AND CORONARY ANGIOGRAPHY N/A 01/04/2024   Procedure: RIGHT/LEFT HEART CATH AND CORONARY ANGIOGRAPHY;  Surgeon: Verlin Lonni BIRCH, MD;  Location: [facility] INVASIVE CV LAB;  Service: Cardiovascular;  Laterality: N/A;   TOOTH EXTRACTION N/A 01/07/2024   Procedure: DENTAL RESTORATION/EXTRACTIONS;  Surgeon: Sheryle Hamilton, DMD;  Location: [facility] OR;  Service: Oral Surgery;  Laterality: N/A;  EXTRACTION OF TEETH #2, #6, #7, #16, #29   TRANSESOPHAGEAL ECHOCARDIOGRAM (CATH LAB) N/A 01/01/2024   Procedure: TRANSESOPHAGEAL ECHOCARDIOGRAM;  Surgeon: Lonni Slain, MD;  Location: [facility] INVASIVE CV LAB;  Service: Cardiovascular;  Laterality: N/A;   TRANSESOPHAGEAL ECHOCARDIOGRAM (CATH LAB) N/A 01/22/2024   Procedure: TRANSESOPHAGEAL ECHOCARDIOGRAM;  Surgeon: Rolan Ezra RAMAN, MD;  Location: [facility] INVASIVE CV LAB;  Service: Cardiovascular;  Laterality: N/A;   Social History:  reports that she has been smoking cigarettes. She has never used smokeless tobacco. She reports current alcohol use. She reports that she does not use drugs.  Family / Support Systems Marital Status: Single Patient Roles: Parent Children: [name] Jarvis, Rob Ability/Limitations of Caregiver: Children  work but may be able to arrange schedules Caregiver Availability: 24/7 Family Dynamics: Family supportive  Social History Preferred language:  English Religion: Christian Education: High school Health Literacy - How often do you need to have someone help you when you read instructions, pamphlets, or other written material from your doctor or pharmacy?: Never Writes: Yes Employment Status: Employed Name of Employer: [employer] Return to Work Plans: Would like to return to work   Abuse/Neglect Abuse/Neglect Assessment Can Be Completed: Yes Physical Abuse: Denies Verbal Abuse: Denies Sexual Abuse: Denies Exploitation of patient/patient's resources: Denies Self-Neglect: Denies  Patient response to: Social Isolation - How often do you feel lonely or isolated from those around you?: Sometimes  Emotional Status Pt's affect, behavior and adjustment status: Adjusting to therapy Recent Psychosocial Issues: None Psychiatric History: None Substance Abuse History: Current smoker  Patient / Family Perceptions, Expectations & Goals Pt/Family understanding of illness & functional limitations: Patient understanding of illness & fubctional limitations Premorbid pt/family roles/activities: Active in the community, independent, driving, working Anticipated changes in roles/activities/participation: None anticipated. Will return back to roles once appropriate Pt/family expectations/goals: Realistic expectations/goals  [employer]: None Premorbid Home Care/DME Agencies: None Transportation available at discharge: Yes Is the patient able to respond to transportation needs?: Yes In the past 12 months, has lack of transportation kept you from medical appointments or from getting medications?: No In the past 12 months, has lack of transportation kept you from meetings, work, or from getting things needed for daily living?: No  Discharge Planning Living Arrangements: Alone Support Systems: Children, Other relatives (Children, sister) Type of Residence: Private residence Insurance Resources: [employer]  (specify) (UNITED HEALTHCARE MEDICARE / UHC MEDICARE) Financial Resources: Employment Financial Screen Referred: Yes Living Expenses: Rent Money Management: Patient Does the patient have any problems obtaining your medications?: No Home Management: Patient manages own home Patient/Family Preliminary Plans: Plans to return home alone with some support from her children Care Coordinator Barriers to Discharge: Lack of/limited family support Care Coordinator Barriers to Discharge Comments: Lack of family support - limited assistance from children Care Coordinator Anticipated Follow Up Needs: HH/OP DC Planning Additional Notes/Comments: HD: MWF Expected length of stay: 10 to 14 days  Clinical Impression CSW met with patient/family to introduce herself and complete initial assessment. Patient presented to [facility] following debilty. Venesha was independent prior to admission working at [employer], driving, as well as living alone. Patient is able to make needs known. She reports that she has some support from her children - she also has a sister in [place] who is able to be of some assistance. She has dialysis MWF.   There were no further needs or concerns at present. CSW will follow up with family and continue to follow. Will provide patient/family with an update as soon as one becomes available.   Di'Asia  Loreli 03/11/2024, 1:02 PM    "

## 2024-03-11 NOTE — Evaluation (Signed)
 Physical Therapy Assessment and Plan  Patient Details  Name: Tracy Baker MRN: [id number] Date of Birth: 04-21-1955  PT Diagnosis: Abnormal posture, Abnormality of gait, Difficulty walking, Dizziness and giddiness, Edema, Muscle weakness, and Pain in bilateral heel (L>R) Rehab Potential: Fair ELOS: 12-14 days   Today's Date: 03/11/2024 PT Individual Time: 9054-8969 PT Individual Time Calculation (min): 45 min Today's Date: 03/11/2024 PT Missed Time: 30 Minutes Missed Time Reason: Pain;Patient fatigue;Other (Comment) (dizziness)   Hospital Problem: Principal Problem:   Debility   Past Medical History:  Past Medical History:  Diagnosis Date   Anxiety    Candidemia (HCC) 12/2023   Nephrolithiasis    lithotripsy 12/14/23 w/stent   Pyelonephritis 12/2023   Thyroid  disease    Vertigo    Past Surgical History:  Past Surgical History:  Procedure Laterality Date   APPENDECTOMY     AUGMENTATION MAMMAPLASTY Bilateral 1981   removed in 2006   BREAST IMPLANT REMOVAL Bilateral 2007   CARDIOVERSION N/A 01/22/2024   Procedure: CARDIOVERSION;  Surgeon: Rolan Ezra RAMAN, MD;  Location: [facility] INVASIVE CV LAB;  Service: Cardiovascular;  Laterality: N/A;   CHOLECYSTECTOMY  2010   COLONOSCOPY WITH PROPOFOL  N/A 06/30/2021   Procedure: COLONOSCOPY WITH PROPOFOL ;  Surgeon: Jinny Carmine, MD;  Location: [facility] ENDOSCOPY;  Service: Endoscopy;  Laterality: N/A;   CONTROL OF EPISTAXIS  02/04/2024   ECMO CANNULATION N/A 02/09/2024   Procedure: ECMO CANNULATION;  Surgeon: Zenaida Morene PARAS, MD;  Location: [facility] INVASIVE CV LAB;  Service: Cardiovascular;  Laterality: N/A;   INTRAOPERATIVE TRANSESOPHAGEAL ECHOCARDIOGRAM N/A 01/10/2024   Procedure: ECHOCARDIOGRAM, TRANSESOPHAGEAL, INTRAOPERATIVE;  Surgeon: Kerrin Elspeth BROCKS, MD;  Location: [facility] OR;  Service: Open Heart Surgery;  Laterality: N/A;   INTRAOPERATIVE TRANSESOPHAGEAL ECHOCARDIOGRAM N/A 02/08/2024   Procedure: ECHOCARDIOGRAM, TRANSESOPHAGEAL,  INTRAOPERATIVE;  Surgeon: Kerrin Elspeth BROCKS, MD;  Location: [facility] OR;  Service: Open Heart Surgery;  Laterality: N/A;   IR TUNNELED CENTRAL VENOUS CATH PLC W IMG  02/25/2024   LAPAROSCOPIC TOTAL HYSTERECTOMY  2006   MITRAL VALVE REPAIR N/A 01/10/2024   Procedure: REPAIR, MITRAL VALVE;  Surgeon: Kerrin Elspeth BROCKS, MD;  Location: [facility] OR;  Service: Open Heart Surgery;  Laterality: N/A;   MITRAL VALVE REPLACEMENT N/A 02/08/2024   Procedure: REPLACEMENT, MITRAL VALVE WITH MITRIS RESILIA MITRAL VALVE 27mm;  Surgeon: Kerrin Elspeth BROCKS, MD;  Location: [facility] OR;  Service: Open Heart Surgery;  Laterality: N/A;   REDO STERNOTOMY N/A 02/08/2024   Procedure: REDO STERNOTOMY;  Surgeon: Kerrin Elspeth BROCKS, MD;  Location: [facility] OR;  Service: Open Heart Surgery;  Laterality: N/A;   RIGHT HEART CATH N/A 02/05/2024   Procedure: RIGHT HEART CATH;  Surgeon: Zenaida Morene PARAS, MD;  Location: [facility] INVASIVE CV LAB;  Service: Cardiovascular;  Laterality: N/A;   RIGHT/LEFT HEART CATH AND CORONARY ANGIOGRAPHY N/A 01/04/2024   Procedure: RIGHT/LEFT HEART CATH AND CORONARY ANGIOGRAPHY;  Surgeon: Verlin Lonni BIRCH, MD;  Location: [facility] INVASIVE CV LAB;  Service: Cardiovascular;  Laterality: N/A;   TOOTH EXTRACTION N/A 01/07/2024   Procedure: DENTAL RESTORATION/EXTRACTIONS;  Surgeon: Sheryle Hamilton, DMD;  Location: [facility] OR;  Service: Oral Surgery;  Laterality: N/A;  EXTRACTION OF TEETH #2, #6, #7, #16, #29   TRANSESOPHAGEAL ECHOCARDIOGRAM (CATH LAB) N/A 01/01/2024   Procedure: TRANSESOPHAGEAL ECHOCARDIOGRAM;  Surgeon: Lonni Slain, MD;  Location: [facility] INVASIVE CV LAB;  Service: Cardiovascular;  Laterality: N/A;   TRANSESOPHAGEAL ECHOCARDIOGRAM (CATH LAB) N/A 01/22/2024   Procedure: TRANSESOPHAGEAL ECHOCARDIOGRAM;  Surgeon: Rolan Ezra RAMAN, MD;  Location: [facility] INVASIVE CV LAB;  Service: Cardiovascular;  Laterality: N/A;    Assessment & Plan Clinical Impression: Patient is a 69 y.o. year old female with history of  thyroid  dx, anxiety, nephrolithiasis s/p lithotripsy w/ ureteral stent at [facility] 12/14/23 which were removed on 10/14 as patient going OOT to attend wedding in [facility]. While in [place], [name], she developed pyelopheritis with candida bacteremia due to UTI treated with IV fluconazole  at [facility]. She signed out AMA to return for treatment closer to home and presented to [facility] 12/29/23.  Dr. Fleeta Rothman consulted  for input on persistent candemiaa as repeat [facility] 10/20 still positive for candida albicans and recommended change to miafungin, repeat TTE as well as ophthalmology consult. Opth consult by Dr. Mike Fleeta was negative for fugal endopthalmitis. TEE showed very small MV vegetation and Dr. Kerrin recommended assessment of coronaries as well as dental evaluation prior to surgery.   She underwent cardiac cath by Dr. Verlin 10/31 showed no evidence of CAD and normal left/right heart pressures.  She underwent  extraction of multiple non-restorable teeth due to dental caries by Dr. Sheryle and underwent repair of mitral valve on 01/10/24 with post cardiotomy shock with A fib managed with amiodarone  gtt as well as fevers and treated with broad spectrum antibiotics due to concerns of aspiration PNA. Heart failure team consulted to manage overload and required Milrinone , Epi and Norepi.     She developed  epistaxis in setting of ASA and Eliquis  on 11/17 requiring d/c of ASA but had recurrent episode 12/01 treated nasal endoscopy and packing by Dr. Penne Croak.   She continued to have worsening of heart failure with inability to wean pressors, found to have severe mitral regurgitation and was taken back to OR on 02/08/24 for Redo median sternotomy with MVR w/Mitris Resilia tissue valve. Post op RV failure required ECMO support 12/06- 12/08. She continued to have worsening of renal status. Dr. Geralynn felt that AKI due due to contrast nephropathy with fluid overload despite high doses of IV diuretics and patient started on   CRRT 12/13.  She tolerated extubation by 12/14 and transitioned to HD. She failed attempts at intermittent HD with IV diuretic challenge.  TDC placed on 12/22 and has been transitioned to HD MWF.    Po intake has been poor in part due to dry mouth and intermittent issues with nausea and continues on tube feeds with additional oral nutritional supplements. She has declined PEG tube and family reported that at baseline she ate like a bird.  Megace  added to stimulate appetite and tube feeds being weaned off--currently at 35 cc/hr at nights.  WOC following for input on stage 3 buttocks DTI as well as rash/dermatitis.   Patient currently requires mod with mobility secondary to muscle weakness, decreased cardiorespiratoy endurance, and decreased standing balance, decreased postural control, decreased balance strategies, and difficulty maintaining precautions.  Prior to hospitalization, patient was independent  with mobility and lived with Alone in a Apartment home.  Home access is 5Stairs to enter.  Patient will benefit from skilled PT intervention to maximize safe functional mobility, minimize fall risk, and decrease caregiver burden for planned discharge home with 24 hour supervision.  Anticipate patient will benefit from follow up HH at discharge.  PT - End of Session Activity Tolerance: Tolerates 30+ min activity with multiple rests Endurance Deficit: Yes Endurance Deficit Description: significantly deconditioned and limited by pain, fatigue, and dizziness. Required ++ time with all aspects of mobility PT Assessment Rehab Potential (ACUTE/IP ONLY): Fair PT Barriers to  Discharge: Decreased caregiver support;Home environment access/layout;Wound Care;Hemodialysis;Weight bearing restrictions;Other (comments) PT Barriers to Discharge Comments: sternal precautions, 5 STE with L handrail, wound care, fatigue from HD, dizziness/vertigo, and pain in bilateral heels PT Patient demonstrates impairments in the  following area(s): Balance;Behavior;Edema;Endurance;Pain;Skin Integrity;Nutrition PT Transfers Functional Problem(s): Bed Mobility;Bed to Chair;Car;Furniture PT Locomotion Functional Problem(s): Ambulation;Stairs;Wheelchair Mobility PT Plan PT Intensity: Minimum of 1-2 x/day ,45 to 90 minutes PT Frequency: 5 out of 7 days PT Duration Estimated Length of Stay: 12-14 days PT Treatment/Interventions: Ambulation/gait training;Discharge planning;Functional mobility training;Therapeutic Activities;Psychosocial support;Visual/perceptual remediation/compensation;Balance/vestibular training;Disease management/prevention;Neuromuscular re-education;Skin care/wound management;Therapeutic Exercise;Wheelchair propulsion/positioning;Cognitive remediation/compensation;DME/adaptive equipment instruction;Pain management;Splinting/orthotics;UE/LE Strength taining/ROM;Community reintegration;Patient/family education;Stair training;UE/LE Coordination activities PT Transfers Anticipated Outcome(s): supervision with LRAD PT Locomotion Anticipated Outcome(s): supervision with LRAD PT Recommendation Recommendations for Other Services: Neuropsych consult Follow Up Recommendations: Home health PT Patient destination: Home Equipment Recommended: To be determined Equipment Details: has shower chair   PT Evaluation Precautions/Restrictions Precautions Precautions: Fall;Sternal Recall of Precautions/Restrictions: Impaired Precaution/Restrictions Comments: cortak Restrictions Weight Bearing Restrictions Per Provider Order: No Other Position/Activity Restrictions: Cardiac sternal precautions Pain Interference Pain Interference Pain Effect on Sleep: 2. Occasionally Pain Interference with Therapy Activities: 1. Rarely or not at all Pain Interference with Day-to-Day Activities: 1. Rarely or not at all Home Living/Prior Functioning Home Living Available Help at Discharge: Family;Available 24 hours/day (sister plans to  stay with her for as long as she needs) Type of Home: Apartment Home Access: Stairs to enter Entrance Stairs-Number of Steps: 5 Entrance Stairs-Rails: Left Home Layout: One level Bathroom Shower/Tub: Walk-in Contractor: Standard Bathroom Accessibility: Yes Additional Comments: Has shower chair but no other DME  Lives With: Alone Prior Function Level of Independence: Independent with basic ADLs;Independent with gait;Independent with homemaking with ambulation;Independent with transfers  Able to Take Stairs?: Yes Driving: Yes Vocation: Part time employment Vocation Requirements: worked in Engineer, Building Services - History Ability to See in Adequate Light: 0 Adequate Perception Perception: Within Functional Limits Praxis Praxis: Impaired Praxis Impairment Details: Initiation  Cognition Overall Cognitive Status: Within Functional Limits for tasks assessed Arousal/Alertness: Lethargic Orientation Level: Oriented X4 Memory: Appears intact Awareness: Impaired Problem Solving: Impaired Safety/Judgment: Appears intact Comments: anxious; poor adherance to sternal precautions Sensation Sensation Light Touch: Appears Intact Hot/Cold: Not tested Proprioception: Appears Intact Stereognosis: Not tested Coordination Gross Motor Movements are Fluid and Coordinated: Yes Fine Motor Movements are Fluid and Coordinated: No Coordination and Movement Description: global weakness/deconditioning, pain, fatigue, and dizziness impacting mobility Finger Nose Finger Test: Tremors bilaterally Motor  Motor Motor: Within Functional Limits  Trunk/Postural Assessment  Cervical Assessment Cervical Assessment: Exceptions to [facility] (forward head) Thoracic Assessment Thoracic Assessment: Exceptions to [facility] (rounded shoulders) Lumbar Assessment Lumbar Assessment: Exceptions to [facility] (mild posterior pelvic tilt) Postural Control Postural Control: Within Functional Limits   Balance Balance Balance Assessed: Yes Static Sitting Balance Static Sitting - Balance Support: Feet supported;Bilateral upper extremity supported Static Sitting - Level of Assistance: 5: Stand by assistance (supervision) Dynamic Sitting Balance Dynamic Sitting - Balance Support: Feet supported;No upper extremity supported Dynamic Sitting - Level of Assistance: 5: Stand by assistance (supervision) Static Standing Balance Static Standing - Balance Support: Right upper extremity supported Static Standing - Level of Assistance: 4: Min assist Extremity Assessment  RLE Assessment RLE Assessment: Not tested General Strength Comments: grossly 3+/5 - limited by pain in heel LLE Assessment LLE Assessment: Not tested General Strength Comments: grossly 3+/5 - limited by pain in heel  Care Tool Care Tool Bed Mobility Roll left and right activity  Roll left and right assist level: Minimal Assistance - Patient > 75%    Sit to lying activity   Sit to lying assist level: Moderate Assistance - Patient 50 - 74%    Lying to sitting on side of bed activity   Lying to sitting on side of bed assist level: the ability to move from lying on the back to sitting on the side of the bed with no back support.: Moderate Assistance - Patient 50 - 74%     Care Tool Transfers Sit to stand transfer   Sit to stand assist level: Moderate Assistance - Patient 50 - 74%    Chair/bed transfer Chair/bed transfer activity did not occur: Safety/medical concerns (pain, fatigue, dizziness)      Car transfer Car transfer activity did not occur: Safety/medical concerns (pain, fatigue, dizziness)        Care Tool Locomotion Ambulation Ambulation activity did not occur: Safety/medical concerns (pain, fatigue, dizziness)        Walk 10 feet activity Walk 10 feet activity did not occur: Safety/medical concerns (pain, fatigue, dizziness)       Walk 50 feet with 2 turns activity Walk 50 feet with 2 turns activity  did not occur: Safety/medical concerns (pain, fatigue, dizziness)      Walk 150 feet activity Walk 150 feet activity did not occur: Safety/medical concerns (pain, fatigue, dizziness)      Walk 10 feet on uneven surfaces activity Walk 10 feet on uneven surfaces activity did not occur: Safety/medical concerns (pain, fatigue, dizziness)      Stairs Stair activity did not occur: Safety/medical concerns (pain, fatigue, dizziness)        Walk up/down 1 step activity Walk up/down 1 step or curb (drop down) activity did not occur: Safety/medical concerns (pain, fatigue, dizziness)      Walk up/down 4 steps activity Walk up/down 4 steps activity did not occur: Safety/medical concerns (pain, fatigue, dizziness)      Walk up/down 12 steps activity Walk up/down 12 steps activity did not occur: Safety/medical concerns (pain, fatigue, dizziness)      Pick up small objects from floor Pick up small object from the floor (from standing position) activity did not occur: Safety/medical concerns (pain, fatigue, dizziness)      Wheelchair Is the patient using a wheelchair?: Yes Type of Wheelchair: Manual Wheelchair activity did not occur: Safety/medical concerns (pain, fatigue, dizziness)      Wheel 50 feet with 2 turns activity Wheelchair 50 feet with 2 turns activity did not occur: Safety/medical concerns (pain, fatigue, dizziness)    Wheel 150 feet activity Wheelchair 150 feet activity did not occur: Safety/medical concerns (pain, fatigue, dizziness)      Refer to Care Plan for Long Term Goals  SHORT TERM GOAL WEEK 1 PT Short Term Goal 1 (Week 1): pt will perform bed mobility with min A consistantly PT Short Term Goal 2 (Week 1): pt will perform bed<>chair transfers with LRAD and CGA PT Short Term Goal 3 (Week 1): pt will ambulate 56ft with LRAD and min A  Recommendations for other services: None   Skilled Therapeutic Intervention Evaluation completed (see details above and below) with  education on PT POC and goals and individual treatment initiated with focus on functional mobility/transfers, generalized strengthening and endurance, and standing tolerance. Received pt supine in bed, pt educated on PT evaluation, CIR policies, and therapy schedule and agreeable. Pt reported pain 7/10 in bilateral heels but declined pain medication. Pt hesitant to get OOB due  to anticipation of increased pain on heels - placed foam dressings on heels. Pt reported that the unna boot wrapping on her legs was causing increased pain - MD notified. With maximal encouragement and ++ time, pt transferred semi-reclined<>sitting L EOB with HOB elevated and mod A for trunk control to adhere to sternal precautions. Pt scooted to EOB with supervision but using BUE to push despite cues. Pt reported increased dizziness and fatigue from dialysis yesterday. Attempted x 2 to stand from EOB with mod HHA, however pt immediately sat back down due to increased dizziness and pain. Pt requested to return to bed and transferred into supine with mod A for BLE management. Concluded session with pt semi-reclined in bed, needs within reach, and bed alarm on. Safety plan updated. 30 minutes missed of skilled physical therapy due to pain, dizziness, and fatigue.    Mobility Bed Mobility Bed Mobility: Rolling Right;Supine to Sit;Sit to Supine Rolling Right: Minimal Assistance - Patient > 75% Supine to Sit: Moderate Assistance - Patient 50-74% Sit to Supine: Moderate Assistance - Patient 50-74% Transfers Transfers: Sit to Stand Sit to Stand: Moderate Assistance - Patient 50-74% Transfer (Assistive device): None Locomotion  Gait Ambulation: No Gait Gait: No Stairs / Additional Locomotion Stairs: No Wheelchair Mobility Wheelchair Mobility: No   Discharge Criteria: Patient will be discharged from PT if patient refuses treatment 3 consecutive times without medical reason, if treatment goals not met, if there is a change in  medical status, if patient makes no progress towards goals or if patient is discharged from hospital.  The above assessment, treatment plan, treatment alternatives and goals were discussed and mutually agreed upon: by patient  Therisa CHRISTELLA Delorse Therisa Delorse PT, DPT 03/11/2024, 10:51 AM

## 2024-03-11 NOTE — Progress Notes (Signed)
 Patient ID: Britainy Kozub, female   DOB: [birth year], 69 y.o.   MRN: [id number]  Statement of service delivered.

## 2024-03-11 NOTE — Progress Notes (Signed)
 Inpatient Rehabilitation  Patient information reviewed and entered into eRehab system by [facility]. Karen Kays., CCC/SLP, PPS Coordinator.  Information including medical coding, functional ability and quality indicators will be reviewed and updated through discharge.

## 2024-03-11 NOTE — Progress Notes (Signed)
" °  [facility] Progress Note    Assessment/ Plan:   Dialysis dependent AKI, initiated 02/16/2024: Creatinine normal at baseline Currently on MWF schedule: 3.5h, 2-3L UF, no heparin . TDC. Next HD wed- may need to increase rx time d/t significant azotemia between rx Following UOP and labs and intradialytic window for any suggestive GFR recovery, none to date Currently using Left IJ TDC placed on 12/22 with IR.   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 On antifungal therapy  Anemia -transfuse PRN for Hgb <7; Hb 10s  Hyponatremia: mild and intermittent.  Manage volume with dialysis, na/fluid restriction  Will follow.    Subjective:    No big issues- moved to rehab- affect seems brighter already.  Annoyed at [employer].      Objective:   BP 97/61 (BP Location: Left Arm)   Pulse 80   Temp 98 F (36.7 C)   Resp 18   Ht 5' 4 (1.626 m)   Wt 62 kg   SpO2 99%   BMI 23.46 kg/m   Intake/Output Summary (Last 24 hours) at 03/11/2024 1332 Last data filed at 03/11/2024 1300 Gross per 24 hour  Intake 200 ml  Output --  Net 200 ml    Weight change:   Physical Exam: Gen: tired appearing, NAD CVS: normal rate, no rub Resp: bl chest rise, normal wob Abd: soft Ext: No significant edema noted Dialysis access:IJ tunneled  HD cath  Imaging: No results found.      Labs: BMET Recent Labs  Lab 03/05/24 0559 03/06/24 0327 03/07/24 0508 03/08/24 0500 03/09/24 0435 03/10/24 0658  NA 132* 132* 131* 132* 133* 133*  K 4.6 4.3 4.7 4.1 4.2 4.3  CL 91* 93* 92* 91* 92* 93*  CO2 22 25 21* 25 23 23   GLUCOSE 134* 159* 147* 185* 162* 166*  BUN 85* 59* 102* 71* 108* 139*  CREATININE 3.32* 2.46* 3.27* 2.80* 3.51* 3.68*  CALCIUM  9.7 9.5 9.8 9.6 9.7 9.6  PHOS 6.1* 5.4* 6.6* 5.7* 6.6* 6.5*   CBC Recent Labs  Lab 03/05/24 0559 03/07/24 0730  WBC 4.7 6.0  NEUTROABS  --  3.8  HGB  10.1* 10.9*  HCT 31.1* 32.1*  MCV 98.7 95.5  PLT 153 155    Medications:     amiodarone   200 mg Oral Daily   apixaban   5 mg Oral BID   clonazePAM   0.5 mg Oral QHS   feeding supplement  237 mL Oral TID BM   feeding supplement (PROSource TF20)  60 mL Per Tube BID   feeding supplement (VITAL 1.5 CAL)  1,000 mL Per Tube Q24H   fluconazole   400 mg Oral Q supper   Gerhardt's butt cream   Topical BID   hydrocerin   Topical BID   insulin  aspart  0-9 Units Subcutaneous TID AC & HS   kidney failure book   Does not apply Once   megestrol   80 mg Oral BID   mirtazapine   30 mg Oral QHS   mouth rinse  15 mL Mouth Rinse 4 times per day   [START ON 03/12/2024] pantoprazole   40 mg Oral Daily   pregabalin   25 mg Oral Daily   QUEtiapine   25 mg Oral QHS   sertraline   25 mg Oral QHS   sodium chloride  flush  10-40 mL Intracatheter Q12H       "

## 2024-03-11 NOTE — Progress Notes (Signed)
 Inpatient Rehabilitation Center Individual Statement of Services  Patient Name:  Tracy Baker  Date:  03/11/2024  Welcome to the Inpatient Rehabilitation Center.  Our goal is to provide you with an individualized program based on your diagnosis and situation, designed to meet your specific needs.  With this comprehensive rehabilitation program, you will be expected to participate in at least 3 hours of rehabilitation therapies Monday-Friday, with modified therapy programming on the weekends.  Your rehabilitation program will include the following services:  Physical Therapy (PT), Occupational Therapy (OT), Speech Therapy (ST), 24 hour per day rehabilitation nursing, Therapeutic Recreaction (TR), Care Coordinator, Rehabilitation Medicine, Nutrition Services, and Pharmacy Services  Weekly team conferences will be held on Wednesday to discuss your progress.  Your Inpatient Rehabilitation Care Coordinator will talk with you frequently to get your input and to update you on team discussions.  Team conferences with you and your family in attendance may also be held.  Expected length of stay: 10 to 14 days  Overall anticipated outcome: Supervision/Verbal cueing   Depending on your progress and recovery, your program may change. Your Inpatient Rehabilitation Care Coordinator will coordinate services and will keep you informed of any changes. Your Inpatient Rehabilitation Care Coordinator's name and contact numbers are listed  below.  The following services may also be recommended but are not provided by the Inpatient Rehabilitation Center:  Driving Evaluations Home Health Rehabiltiation Services Outpatient Rehabilitation Services Vocational Rehabilitation   Arrangements will be made to provide these services after discharge if needed.  Arrangements include referral to agencies that provide these services.  Your insurance has been verified to be:  [facility]  Your primary doctor is: Mc[place]ough,  Tinnie POUR, PA-C   Pertinent information will be shared with your doctor and your insurance company.  Inpatient Rehabilitation Care Coordinator:  [name] SIERRAS [phone] or ELIGAH BRINKS  Information discussed with and copy given to patient by: Waverly Loreli, 03/11/2024, 11:46 AM

## 2024-03-11 NOTE — Evaluation (Addendum)
 Occupational Therapy Assessment and Plan  Patient Details  Name: Tracy Baker MRN: [id number] Date of Birth: 03-Jun-1955  OT Diagnosis: acute pain, cognitive deficits, muscle weakness (generalized), and swelling of limb Rehab Potential: Rehab Potential (ACUTE ONLY): Fair ELOS: 14-18 days   Today's Date: 03/11/2024 OT Individual Time: 9194-9084 OT Individual Time Calculation (min): 70 min     Baker Problem: Principal Problem:   Debility   Past Medical History:  Past Medical History:  Diagnosis Date   Anxiety    Candidemia (HCC) 12/2023   Nephrolithiasis    lithotripsy 12/14/23 w/stent   Pyelonephritis 12/2023   Thyroid  disease    Vertigo    Past Surgical History:  Past Surgical History:  Procedure Laterality Date   APPENDECTOMY     AUGMENTATION MAMMAPLASTY Bilateral 1981   removed in 2006   BREAST IMPLANT REMOVAL Bilateral 2007   CARDIOVERSION N/A 01/22/2024   Procedure: CARDIOVERSION;  Surgeon: Tracy Ezra RAMAN, MD;  Location: [facility];  Service: Cardiovascular;  Laterality: N/A;   CHOLECYSTECTOMY  2010   COLONOSCOPY WITH PROPOFOL  N/A 06/30/2021   Procedure: COLONOSCOPY WITH PROPOFOL ;  Surgeon: Tracy Carmine, MD;  Location: [facility];  Service: Baker;  Laterality: N/A;   CONTROL OF EPISTAXIS  02/04/2024   ECMO CANNULATION N/A 02/09/2024   Procedure: ECMO CANNULATION;  Surgeon: Tracy Morene PARAS, MD;  Location: [facility];  Service: Cardiovascular;  Laterality: N/A;   INTRAOPERATIVE TRANSESOPHAGEAL ECHOCARDIOGRAM N/A 01/10/2024   Procedure: ECHOCARDIOGRAM, TRANSESOPHAGEAL, INTRAOPERATIVE;  Surgeon: Tracy Elspeth BROCKS, MD;  Location: [facility];  Service: Open Heart Surgery;  Laterality: N/A;   INTRAOPERATIVE TRANSESOPHAGEAL ECHOCARDIOGRAM N/A 02/08/2024   Procedure: ECHOCARDIOGRAM, TRANSESOPHAGEAL, INTRAOPERATIVE;  Surgeon: Tracy Elspeth BROCKS, MD;  Location: [facility];  Service: Open Heart Surgery;  Laterality: N/A;   IR TUNNELED CENTRAL VENOUS CATH  PLC W IMG  02/25/2024   LAPAROSCOPIC TOTAL HYSTERECTOMY  2006   MITRAL VALVE REPAIR N/A 01/10/2024   Procedure: REPAIR, MITRAL VALVE;  Surgeon: Tracy Elspeth BROCKS, MD;  Location: [facility];  Service: Open Heart Surgery;  Laterality: N/A;   MITRAL VALVE REPLACEMENT N/A 02/08/2024   Procedure: REPLACEMENT, MITRAL VALVE WITH MITRIS RESILIA MITRAL VALVE 27mm;  Surgeon: Tracy Elspeth BROCKS, MD;  Location: [facility];  Service: Open Heart Surgery;  Laterality: N/A;   REDO STERNOTOMY N/A 02/08/2024   Procedure: REDO STERNOTOMY;  Surgeon: Tracy Elspeth BROCKS, MD;  Location: [facility];  Service: Open Heart Surgery;  Laterality: N/A;   RIGHT HEART CATH N/A 02/05/2024   Procedure: RIGHT HEART CATH;  Surgeon: Tracy Morene PARAS, MD;  Location: [facility];  Service: Cardiovascular;  Laterality: N/A;   RIGHT/LEFT HEART CATH AND CORONARY ANGIOGRAPHY N/A 01/04/2024   Procedure: RIGHT/LEFT HEART CATH AND CORONARY ANGIOGRAPHY;  Surgeon: Tracy Tracy BIRCH, MD;  Location: [facility];  Service: Cardiovascular;  Laterality: N/A;   TOOTH EXTRACTION N/A 01/07/2024   Procedure: DENTAL RESTORATION/EXTRACTIONS;  Surgeon: Tracy [facility], DMD;  Location: [facility];  Service: Oral Surgery;  Laterality: N/A;  EXTRACTION OF TEETH #2, #6, #7, #16, #29   TRANSESOPHAGEAL ECHOCARDIOGRAM (CATH [facility]) N/A 01/01/2024   Procedure: TRANSESOPHAGEAL ECHOCARDIOGRAM;  Surgeon: Tracy Slain, MD;  Location: [facility];  Service: Cardiovascular;  Laterality: N/A;   TRANSESOPHAGEAL ECHOCARDIOGRAM (CATH Baker) N/A 01/22/2024   Procedure: TRANSESOPHAGEAL ECHOCARDIOGRAM;  Surgeon: Tracy Ezra RAMAN, MD;  Location: [facility];  Service: Cardiovascular;  Laterality: N/A;    Assessment & Plan Clinical Impression: Tracy Baker is a  69 year old female with history of thyroid  dx, anxiety, nephrolithiasis s/p lithotripsy w/ ureteral stent at [facility] 12/14/23 which were removed on 10/14 as patient going OOT to attend wedding in  [place]. [place] in [facility] [facility], she developed pyelopheritis with candida bacteremia due to UTI treated with IV fluconazole  at [facility]. She signed out AMA to return for treatment closer to home and presented to [facility] 12/29/23.  Dr. Fleeta Baker consulted  for input on persistent candemiaa as repeat [facility] 10/20 still positive for candida albicans and recommended change to miafungin, repeat TTE as well as ophthalmology consult. Opth consult by Dr. Mike Tracy was negative for fugal endopthalmitis. TEE showed very small MV vegetation and [facility] recommended assessment of coronaries as well as dental evaluation prior to surgery.   She underwent cardiac cath by Dr. Verlin 10/31 showed no evidence of CAD and normal left/right heart pressures.  She underwent  extraction of multiple non-restorable teeth due to dental caries by Dr. Sheryle and underwent repair of mitral valve on 01/10/24 with post cardiotomy shock with A fib managed with amiodarone  gtt as well as fevers and treated with broad spectrum antibiotics due to concerns of aspiration PNA. Heart failure team consulted to manage overload and required Milrinone , Epi and Norepi.     She developed  epistaxis in setting of ASA and Eliquis  on 11/17 requiring d/c of ASA but had recurrent episode 12/01 treated nasal Baker and packing by [facility].   She continued to have worsening of heart failure with inability to wean pressors, found to have severe mitral regurgitation and was taken back to [facility] on 02/08/24 for Redo median sternotomy with MVR w/Mitris Resilia tissue valve. Post op RV failure required ECMO support 12/06- 12/08. She continued to have worsening of renal status. Dr. Geralynn felt that AKI due due to contrast nephropathy with fluid overload despite high doses of IV diuretics and patient started on  CRRT 12/13.  She tolerated extubation by 12/14 and transitioned to HD. She failed attempts at intermittent HD with IV diuretic challenge.  TDC placed on 12/22  and has been transitioned to HD MWF.    Po intake has been poor in part due to dry mouth and intermittent issues with nausea and continues on tube feeds with additional oral nutritional supplements. She has declined PEG tube and family reported that at baseline she ate like a bird.  Megace  added to stimulate appetite and tube feeds being weaned off--currently at 35 cc/hr at nights.  WOC following for input on stage 3 buttocks DTI as well as rash/dermatitis.  Patient transferred to CIR on 03/10/2024 .    Patient currently requires max with basic self-care skills secondary to muscle weakness, decreased cardiorespiratoy endurance, decreased coordination, decreased initiation and decreased problem solving, and decreased sitting balance, decreased balance strategies, and difficulty maintaining precautions.  Prior to hospitalization, patient could complete self-care independently.  Patient will benefit from skilled intervention to decrease level of assist with basic self-care skills and increase independence with basic self-care skills prior to discharge home independently.  Anticipate patient will require 24/7 supervision and follow up home health.  OT - End of Session Activity Tolerance: Tolerates < 10 min activity, no significant change in vital signs Endurance Deficit: Yes Endurance Deficit Description: significantly deconditioned and limited by vertigo with simple ADLs OT Assessment Rehab Potential (ACUTE ONLY): Fair OT Barriers to Discharge: Home environment access/layout;Inaccessible home environment;Nutrition means;Lack of/limited family support OT Patient demonstrates impairments in the following area(s): Balance;Edema;Endurance;Motor;Nutrition;Pain;Skin Integrity  OT Basic ADL's Functional Problem(s): Bathing;Dressing;Toileting OT Tracy ADL's Functional Problem(s): None OT Transfers Functional Problem(s): Toilet;Tub/Shower OT Additional Impairment(s): None OT Plan OT Intensity: Minimum of  1-2 x/day, 45 to 90 minutes OT Frequency: 5 out of 7 days OT Duration/Estimated Length of Stay: 14-18 days OT Treatment/Interventions: Balance/vestibular training;Discharge planning;Pain management;Self Care/Tracy ADL retraining;Therapeutic Activities;UE/LE Coordination activities;Disease mangement/prevention;Functional mobility training;Patient/family education;Skin care/wound managment;Therapeutic Exercise;DME/adaptive equipment instruction;Neuromuscular re-education;UE/LE Strength taining/ROM;Wheelchair propulsion/positioning OT Self Feeding Anticipated Outcome(s): Mod I OT Basic Self-Care Anticipated Outcome(s): Supervision OT Toileting Anticipated Outcome(s): Supervision OT Bathroom Transfers Anticipated Outcome(s): Supervision OT Recommendation Recommendations for Other Services: Therapeutic Recreation consult Therapeutic Recreation Interventions: Pet therapy Patient destination: Home Follow Up Recommendations: Home health OT Equipment Recommended: To be determined   OT Evaluation Precautions/Restrictions  Precautions Precautions: Fall;Sternal Precaution/Restrictions Comments: cortak Restrictions Weight Bearing Restrictions Per Provider Order: No Other Position/Activity Restrictions: Cardiac sternal precautions Home Living/Prior Functioning Home Living Family/patient expects to be discharged to:: Private residence Living Arrangements: Alone Available Help at Discharge: Family, Available 24 hours/day (sister plans to stay with her for as long as she needs) Type of Home: Apartment Home Access: Stairs to enter [employer] of Steps: 5 Entrance Stairs-Rails: Left Home Layout: One level Bathroom Shower/Tub: Psychologist, counselling, Engineer, Building Services: Administrator Accessibility: Yes Additional Comments: Has shower chair but no other DME  Lives With: Alone IADL History Homemaking Responsibilities: Yes Meal Prep Responsibility: Primary Laundry Responsibility:  Primary Current License: Yes Occupation: Part time employment Type of Occupation: Conservation Officer, Nature at [employer] Prior Function Level of Independence: Independent with basic ADLs, Independent with gait, Independent with homemaking with ambulation, Independent with transfers  Able to Take Stairs?: Yes Driving: Yes Vision Baseline Vision/History: 1 Wears glasses Ability to See in Adequate Light: 0 Adequate Patient Visual Report: No change from baseline Vision Assessment?: No apparent visual deficits Perception  Perception: Within Functional Limits Praxis Praxis: Impaired Praxis Impairment Details: Initiation Cognition Cognition Overall Cognitive Status: Within Functional Limits for tasks assessed Arousal/Alertness: Lethargic Orientation Level: Person;Place;Situation Person: Oriented Place: Oriented Situation: Oriented Memory: Appears intact Awareness: Appears intact Problem Solving: Impaired Safety/Judgment: Appears intact Comments: anxious Brief Interview for Mental Status (BIMS) Repetition of Three Words (First Attempt): 3 Temporal Orientation: Year: Correct Temporal Orientation: Month: Accurate within 5 days Temporal Orientation: Day: Correct Recall: Sock: Yes, no cue required Recall: Blue: Yes, no cue required Recall: Bed: Yes, no cue required BIMS Summary Score: 15 Sensation Sensation Light Touch: Appears Intact Hot/Cold: Not tested Proprioception: Appears Intact Stereognosis: Not tested Coordination Gross Motor Movements are Fluid and Coordinated: No Fine Motor Movements are Fluid and Coordinated: No Coordination and Movement Description: significant debility limiting coordination Finger Nose Finger Test: Tremors bilaterally Motor  Motor Motor: Within Functional Limits  Trunk/Postural Assessment  Cervical Assessment Cervical Assessment: Exceptions to [facility] (forward head) Thoracic Assessment Thoracic Assessment: Exceptions to [facility] (rounded shoulders) Lumbar  Assessment Lumbar Assessment: Exceptions to [facility] (mild posterior pelvic tilt) Postural Control Postural Control: Within Functional Limits  Balance Balance Balance Assessed: Yes Static Sitting Balance Static Sitting - Balance Support: Feet supported;Bilateral upper extremity supported Static Sitting - Level of Assistance: 5: Stand by assistance (supervision) Dynamic Sitting Balance Dynamic Sitting - Balance Support: Feet supported;No upper extremity supported Dynamic Sitting - Level of Assistance: 5: Stand by assistance (supervision) Static Standing Balance Static Standing - Balance Support: Right upper extremity supported Static Standing - Level of Assistance: 4: Min assist Extremity/Trunk Assessment RUE Assessment General Strength Comments: UTA formally due to sternal precautions but grossly limited due to debility  LUE Assessment LUE Assessment: Exceptions to [facility] Strength Comments: UTA formally due to sternal precautions but grossly limited due to debility  Care Tool Care Tool Self Care Eating   Eating Assist Level: Supervision/Verbal cueing    Oral Care    Oral Care Assist Level: Supervision/Verbal cueing    Bathing   Body parts bathed by patient: Right arm;Left arm;Chest;Abdomen;Front perineal area;Face Body parts bathed by helper: Buttocks;Right upper leg;Left upper leg;Right lower leg;Left lower leg   Assist Level: Moderate Assistance - Patient 50 - 74%    Upper Body Dressing(including orthotics)   What is the patient wearing?: Pull over shirt   Assist Level: Maximal Assistance - Patient 25 - 49%    Lower Body Dressing (excluding footwear)   What is the patient wearing?: Incontinence brief;Pants Assist for lower body dressing: Total Assistance - Patient < 25%    Putting on/Taking off footwear   What is the patient wearing?: Non-skid slipper socks Assist for footwear: Dependent - Patient 0%       Care Tool Toileting Toileting activity   Assist for  toileting: Total Assistance - Patient < 25%     Care Tool Bed Mobility Roll left and right activity   Roll left and right assist level: Minimal Assistance - Patient > 75%    Sit to lying activity        Lying to sitting on side of bed activity   Lying to sitting on side of bed assist level: the ability to move from lying on the back to sitting on the side of the bed with no back support.: Moderate Assistance - Patient 50 - 74%     Care Tool Transfers Sit to stand transfer        Chair/bed transfer         Toilet transfer Toilet transfer activity did not occur: Safety/medical concerns (pt limited by vertigo)       Care Tool Cognition  Expression of Ideas and Wants Expression of Ideas and Wants: 4. Without difficulty (complex and basic) - expresses complex messages without difficulty and with speech that is clear and easy to understand  Understanding Verbal and Non-Verbal Content Understanding Verbal and Non-Verbal Content: 4. Understands (complex and basic) - clear comprehension without cues Baker repetitions   Memory/Recall Ability Memory/Recall Ability : Current season;That he Baker she is in a Baker/B[facility] unit   Refer to Care Plan for Long Term Goals  SHORT TERM GOAL WEEK 1 OT Short Term Goal 1 (Week 1): Pt will complete sit > stand with CGA using LRAD OT Short Term Goal 2 (Week 1): Pt will complete toilet transfer with min A using LRAD OT Short Term Goal 3 (Week 1): Pt will complete 2/3 toileting steps with CGA for balance OT Short Term Goal 4 (Week 1): Pt will adhere to sternal precautions 100% for 2 consecutive sessions with supervision  Recommendations for other services: Neuropsych and Therapeutic Recreation  Pet therapy   Skilled Therapeutic Intervention Patient received in bed upon therapy arrival and agreeable to participate in OT evaluation. Education provided on OT purpose, therapy schedule, goals for therapy, and safety policy while in rehab. Discomfort  reported in sternal region when not adhering to precautions. Offered education on precautions (pt only remembered 2/3 of them accurately), and repositioning and frequent rest breaks.  Nursing present to disconnect cortrak and to administer meds. OT retrieved manual w/c with bilateral elevating leg rests, roho cushion (per chart review has stage 3 DTI), and also a  BSC. Despite retrieval of BSC, pt with urgent need for BM therefore assisted with bed pan at bed level requiring min A for rolling and total A for toileting (continent of BM only- nursing informed). Donned pants at bed level max A with cues needed for maintaining adherence to sternal precautions and pushing from legs vs pulling. Pt required one step commands for decreased initiation to all tasks, and demonstrated significant deconditioning with basic ADL and functional mobility tasks. Mod A needed for supine > > L sidelying > sit EOB. Max A donning of shirt. Pt limited by vertigo and dizziness and declined OOB transfer to w/c. Unable to stand per report due to dizziness therefore bumped hips up > HOB with min A and max cueing. Max A sit > supine transition and +2 > HOB. Pt remained upright in bed at conclusion of session with bed alarm on and all needs met at end of session.  See below for further ADL and functional transfer performance. Pt will benefit from skilled OT services to focus on mentioned deficits.   ADL ADL Eating: Supervision/safety Where Assessed-Eating: Bed level Grooming: Supervision/safety Where Assessed-Grooming: Edge of bed Upper Body Bathing: Supervision/safety Where Assessed-Upper Body Bathing: Edge of bed Lower Body Bathing: Moderate assistance Where Assessed-Lower Body Bathing: Bed level Upper Body Dressing: Maximal assistance Where Assessed-Upper Body Dressing: Edge of bed Lower Body Dressing: Dependent Where Assessed-Lower Body Dressing: Bed level Toileting: Dependent Where Assessed-Toileting: Bed  level Toilet Transfer: Unable to assess (limited by vertigo and pt politely declined OOB) Toilet Transfer Method: Unable to assess Tub/Shower Transfer: Unable to assess Tub/Shower Transfer Method: Unable to assess Film/video Editor: Unable to assess Film/video Editor Method: Unable to assess Mobility  Bed Mobility Bed Mobility: Rolling Right;Supine to Sit;Sit to Supine Rolling Right: Minimal Assistance - Patient > 75% Supine to Sit: Moderate Assistance - Patient 50-74% Sit to Supine: Moderate Assistance - Patient 50-74% Transfers Sit to Stand: Moderate Assistance - Patient 50-74%   Discharge Criteria: Patient will be discharged from OT if patient refuses treatment 3 consecutive times without medical reason, if treatment goals not met, if there is a change in medical status, if patient makes no progress towards goals Baker if patient is discharged from [facility].  The above assessment, treatment plan, treatment alternatives and goals were discussed and mutually agreed upon: by patient  [name], MS, OTR/L  03/11/2024, 9:41 AM

## 2024-03-11 NOTE — Progress Notes (Signed)
 Following for CLIP to out-pt HD clinic. Noted that pt now in CIR, awaiting anticipated d/c date. Once known, will CLIP.   Lavanda Carlus Stay Dialysis Navigator [phone]

## 2024-03-12 ENCOUNTER — Inpatient Hospital Stay: Payer: Self-pay | Admitting: Internal Medicine

## 2024-03-12 DIAGNOSIS — R5381 Other malaise: Secondary | ICD-10-CM | POA: Diagnosis not present

## 2024-03-12 LAB — GLUCOSE, CAPILLARY
Glucose-Capillary: 175 mg/dL — ABNORMAL HIGH (ref 70–99)
Glucose-Capillary: 147 mg/dL — ABNORMAL HIGH (ref 70–99)
Glucose-Capillary: 129 mg/dL — ABNORMAL HIGH (ref 70–99)

## 2024-03-12 LAB — CBC WITH DIFFERENTIAL/PLATELET
Abs Immature Granulocytes: 0.1 K/uL — ABNORMAL HIGH (ref 0.00–0.07)
Basophils Absolute: 0 K/uL (ref 0.0–0.1)
Basophils Relative: 0 %
Eosinophils Absolute: 0.1 K/uL (ref 0.0–0.5)
Eosinophils Relative: 1 %
HCT: 35.7 % — ABNORMAL LOW (ref 36.0–46.0)
Hemoglobin: 12 g/dL (ref 12.0–15.0)
Immature Granulocytes: 1 %
Lymphocytes Relative: 11 %
Lymphs Abs: 1.1 K/uL (ref 0.7–4.0)
MCH: 32 pg (ref 26.0–34.0)
MCHC: 33.6 g/dL (ref 30.0–36.0)
MCV: 95.2 fL (ref 80.0–100.0)
Monocytes Absolute: 0.7 K/uL (ref 0.1–1.0)
Monocytes Relative: 7 %
Neutro Abs: 8.3 K/uL — ABNORMAL HIGH (ref 1.7–7.7)
Neutrophils Relative %: 80 %
Platelets: 158 K/uL (ref 150–400)
RBC: 3.75 MIL/uL — ABNORMAL LOW (ref 3.87–5.11)
RDW: 17.5 % — ABNORMAL HIGH (ref 11.5–15.5)
WBC: 10.3 K/uL (ref 4.0–10.5)
nRBC: 0 % (ref 0.0–0.2)

## 2024-03-12 LAB — CBC
HCT: 31 % — ABNORMAL LOW (ref 36.0–46.0)
Hemoglobin: 10.3 g/dL — ABNORMAL LOW (ref 12.0–15.0)
MCH: 32.7 pg (ref 26.0–34.0)
MCHC: 33.2 g/dL (ref 30.0–36.0)
MCV: 98.4 fL (ref 80.0–100.0)
Platelets: 134 K/uL — ABNORMAL LOW (ref 150–400)
RBC: 3.15 MIL/uL — ABNORMAL LOW (ref 3.87–5.11)
RDW: 17.3 % — ABNORMAL HIGH (ref 11.5–15.5)
WBC: 9.5 K/uL (ref 4.0–10.5)
nRBC: 0 % (ref 0.0–0.2)

## 2024-03-12 LAB — BASIC METABOLIC PANEL WITH GFR
Anion gap: 20 — ABNORMAL HIGH (ref 5–15)
BUN: 121 mg/dL — ABNORMAL HIGH (ref 8–23)
CO2: 21 mmol/L — ABNORMAL LOW (ref 22–32)
Calcium: 10 mg/dL (ref 8.9–10.3)
Chloride: 87 mmol/L — ABNORMAL LOW (ref 98–111)
Creatinine, Ser: 3.94 mg/dL — ABNORMAL HIGH (ref 0.44–1.00)
GFR, Estimated: 12 mL/min — ABNORMAL LOW
Glucose, Bld: 146 mg/dL — ABNORMAL HIGH (ref 70–99)
Potassium: 4.9 mmol/L (ref 3.5–5.1)
Sodium: 127 mmol/L — ABNORMAL LOW (ref 135–145)

## 2024-03-12 LAB — RENAL FUNCTION PANEL
Albumin: 3.9 g/dL (ref 3.5–5.0)
Anion gap: 20 — ABNORMAL HIGH (ref 5–15)
BUN: 123 mg/dL — ABNORMAL HIGH (ref 8–23)
CO2: 20 mmol/L — ABNORMAL LOW (ref 22–32)
Calcium: 9.8 mg/dL (ref 8.9–10.3)
Chloride: 89 mmol/L — ABNORMAL LOW (ref 98–111)
Creatinine, Ser: 3.94 mg/dL — ABNORMAL HIGH (ref 0.44–1.00)
GFR, Estimated: 12 mL/min — ABNORMAL LOW
Glucose, Bld: 118 mg/dL — ABNORMAL HIGH (ref 70–99)
Phosphorus: 5.7 mg/dL — ABNORMAL HIGH (ref 2.5–4.6)
Potassium: 4.8 mmol/L (ref 3.5–5.1)
Sodium: 128 mmol/L — ABNORMAL LOW (ref 135–145)

## 2024-03-12 LAB — HEPATITIS B SURFACE ANTIBODY, QUANTITATIVE: Hep B S AB Quant (Post): 152 m[IU]/mL

## 2024-03-12 LAB — MAGNESIUM: Magnesium: 2.4 mg/dL (ref 1.7–2.4)

## 2024-03-12 MED ORDER — VITAMIN D 25 MCG (1000 UNIT) PO TABS
1000.0000 [IU] | ORAL_TABLET | Freq: Every day | ORAL | Status: DC
  Administered 2024-03-12 – 2024-03-19 (×8): 1000 [IU] via ORAL
  Filled 2024-03-12 (×8): qty 1

## 2024-03-12 MED ORDER — LIDOCAINE-PRILOCAINE 2.5-2.5 % EX CREA: 1.0000 | TOPICAL_CREAM | CUTANEOUS | Status: DC | PRN

## 2024-03-12 MED ORDER — CYANOCOBALAMIN 1000 MCG/ML IJ SOLN
1000.0000 ug | Freq: Once | INTRAMUSCULAR | Status: AC
  Administered 2024-03-12: 1000 ug via INTRAMUSCULAR
  Filled 2024-03-12: qty 1

## 2024-03-12 MED ORDER — QUETIAPINE FUMARATE 25 MG PO TABS
12.5000 mg | ORAL_TABLET | Freq: Every day | ORAL | Status: DC
  Administered 2024-03-12: 12.5 mg via ORAL
  Filled 2024-03-12: qty 1

## 2024-03-12 MED ORDER — ANTICOAGULANT SODIUM CITRATE 4% (200MG/5ML) IV SOLN: 5.0000 mL | Status: DC | PRN

## 2024-03-12 MED ORDER — ALTEPLASE 2 MG IJ SOLR: 2.0000 mg | Freq: Once | INTRAMUSCULAR | Status: DC | PRN

## 2024-03-12 MED ORDER — FLUCONAZOLE 200 MG PO TABS
400.0000 mg | ORAL_TABLET | Freq: Every day | ORAL | Status: DC
  Administered 2024-03-12 – 2024-04-02 (×20): 400 mg via ORAL
  Filled 2024-03-12 (×22): qty 2

## 2024-03-12 MED ORDER — HEPARIN SODIUM (PORCINE) 1000 UNIT/ML DIALYSIS
1000.0000 [IU] | INTRAMUSCULAR | Status: DC | PRN
  Administered 2024-03-12: 1000 [IU]

## 2024-03-12 MED ORDER — HEPARIN SODIUM (PORCINE) 1000 UNIT/ML IJ SOLN
INTRAMUSCULAR | Status: AC
  Filled 2024-03-12: qty 5

## 2024-03-12 MED ORDER — PENTAFLUOROPROP-TETRAFLUOROETH EX AERO: 1.0000 | INHALATION_SPRAY | CUTANEOUS | Status: DC | PRN

## 2024-03-12 MED ORDER — LIDOCAINE HCL (PF) 1 % IJ SOLN: 5.0000 mL | INTRAMUSCULAR | Status: DC | PRN

## 2024-03-12 MED ORDER — FLUCONAZOLE 200 MG PO TABS
200.0000 mg | ORAL_TABLET | Freq: Every day | ORAL | Status: DC
  Filled 2024-03-12: qty 1

## 2024-03-12 MED ORDER — PREGABALIN 25 MG PO CAPS
25.0000 mg | ORAL_CAPSULE | Freq: Every day | ORAL | Status: DC
  Administered 2024-03-13 – 2024-03-18 (×6): 25 mg via ORAL
  Filled 2024-03-12 (×6): qty 1

## 2024-03-12 NOTE — Progress Notes (Addendum)
 Will follow up on friday to begin CLIP to out-pt HD clinic. Cannot do too early.   Trenda Corliss Dialysis Nav [phone]

## 2024-03-12 NOTE — Progress Notes (Signed)
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 TDC. Next HD wed- have increased rx time d/t significant azotemia between rx Following UOP and labs and intradialytic window for any suggestive GFR recovery, none to date Currently using Left IJ TDC placed on 12/22 with IR.   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 On antifungal therapy  Anemia -transfuse PRN for Hgb <7; Hb 10s  Hyponatremia: mild and intermittent.  Manage volume with dialysis, na/fluid restriction  Will follow.    Subjective:    Seen in room. Therapies and then for HD today.    Objective:   BP 107/61 (BP Location: Left Arm)   Pulse 91   Temp 97.6 F (36.4 C) (Oral)   Resp 16   Ht 5' 4 (1.626 m)   Wt 66.1 kg   SpO2 90%   BMI 25.03 kg/m   Intake/Output Summary (Last 24 hours) at 03/12/2024 1513 Last data filed at 03/12/2024 1304 Gross per 24 hour  Intake 480 ml  Output --  Net 480 ml    Weight change:   Physical Exam: Gen: tired appearing, NAD CVS: normal rate, no rub Resp: bl chest rise, normal wob Abd: soft Ext: No significant edema noted Dialysis access:IJ tunneled  HD cath  Imaging: No results found.      Labs: BMET Recent Labs  Lab 03/06/24 0327 03/07/24 0508 03/08/24 0500 03/09/24 0435 03/10/24 0658 03/12/24 1142  NA 132* 131* 132* 133* 133* 127*  K 4.3 4.7 4.1 4.2 4.3 4.9  CL 93* 92* 91* 92* 93* 87*  CO2 25 21* 25 23 23  21*  GLUCOSE 159* 147* 185* 162* 166* 146*  BUN 59* 102* 71* 108* 139* 121*  CREATININE 2.46* 3.27* 2.80* 3.51* 3.68* 3.94*  CALCIUM  9.5 9.8 9.6 9.7 9.6 10.0  PHOS 5.4* 6.6* 5.7* 6.6* 6.5*  --    CBC Recent Labs  Lab 03/07/24 0730 03/12/24 1142  WBC 6.0 10.3  NEUTROABS 3.8 8.3*  HGB  10.9* 12.0  HCT 32.1* 35.7*  MCV 95.5 95.2  PLT 155 158    Medications:     amiodarone   200 mg Oral Daily   apixaban   5 mg Oral BID   cholecalciferol   1,000 Units Oral Daily   clonazePAM   0.5 mg Oral QHS   feeding supplement  237 mL Oral TID BM   feeding supplement (PROSource TF20)  60 mL Per Tube BID   feeding supplement (VITAL 1.5 CAL)  1,000 mL Per Tube Q24H   fluconazole   400 mg Oral Q supper   Gerhardt's butt cream   Topical BID   hydrocerin   Topical BID   insulin  aspart  0-9 Units Subcutaneous TID AC & HS   kidney failure book   Does not apply Once   megestrol   80 mg Oral BID   mirtazapine   30 mg Oral QHS   mouth rinse  15 mL Mouth Rinse 4 times per day   pantoprazole   40 mg Oral Daily   [START ON 03/13/2024] pregabalin   25 mg Oral QHS   QUEtiapine   12.5 mg Oral QHS   sertraline   25 mg Oral QHS       "

## 2024-03-12 NOTE — Progress Notes (Signed)
 Patient ID: Tracy [facility], female   DOB: Aug 30, 1955, 69 y.o.   MRN: [id number]  Have reviewed team conference with patient - she is aware and agreeable with targeted d/c date of 01/20 and goals of Supervision/Verbal cueing.  Patient reports that either her daughter or son will provide transportation home and her [place] to her for a week or so after discharge.

## 2024-03-12 NOTE — Progress Notes (Signed)
 Occupational Therapy Session Note  Patient Details  Name: Tracy Baker MRN: [id number] Date of Birth: 1955-08-30  Today's Date: 03/12/2024 OT Individual Time: 9199-9159 & 217 [id number] [id number] OT Individual Time Calculation (min): 40 min & 40 min   Short Term Goals: Week 1:  OT Short Term Goal 1 (Week 1): Pt will complete sit > stand with CGA using LRAD OT Short Term Goal 2 (Week 1): Pt will complete toilet transfer with min A using LRAD OT Short Term Goal 3 (Week 1): Pt will complete 2/3 toileting steps with CGA for balance OT Short Term Goal 4 (Week 1): Pt will adhere to sternal precautions 100% for 2 consecutive sessions with supervision  Skilled Therapeutic Interventions/Progress Updates:  Session 1 Skilled OT intervention completed with focus on ADL retraining and diet education. Pt received seated in w/c, agreeable to session. Throat pain reported with swallowing; MD made aware. OT offered water  per diet protocol for her discomfort.  Pt had breakfast in front of her, but had barely touched it. When questioned, pt reports she has trouble with foods that aren't soft due to her teeth extractions couple months back, and also have throat discomfort with swallowing. OT educated on soft foods like soups and mashed potatoes that could assist with easier consumption. Pt was able to read menu and identify several items that interested her for lunch and dinner. Discussed that the more she eats, the quicker the NG tube could come out per MD, so that she could have increased comfort with removal of tubing and did later inquire this with MD.  Pt with slow initiation but did participate today- including supervision face washing, oral care. OT combed her hair for severe matting in prep for hair washing next session (not cleared to shower due to HD port), to improve self efficacy and psycho-emotional health. Education provided on how to comb hair within sternal precautions and pt able to do so with supervision.  Pt  remained seated in w/c, with chair alarm on/activated, and with all needs in reach at end of session.  Session 2 Skilled OT intervention completed with focus on self-care and BUE endurance. Pt received seated in w/c, agreeable to session. Sacral pain reported when leaning back into w/c; nurse made aware. OT offered repositioning throughout for pain reduction.  Completed hair washing at sink via hair washing tray. OT completed total A. Discussed hair wash tray for home but will need assist- has HD port therefore shouldn't shower for infection prevention. Encouraged pt to use BUE for endurance to dry hair- pt did a little but not very thorough indicating fatigue. Pt continues to demo decreased initiation and motivation.  Pt remained seated in w/c, with chair alarm on/activated, and with all needs in reach at end of session.   Therapy Documentation Precautions:  Precautions Precautions: Fall, Sternal Recall of Precautions/Restrictions: Impaired Precaution/Restrictions Comments: cortak Restrictions Weight Bearing Restrictions Per Provider Order: No Other Position/Activity Restrictions: Cardiac sternal precautions    Therapy/Group: Individual Therapy  [name], MS, OTR/L  03/12/2024, 11:52 AM

## 2024-03-12 NOTE — Progress Notes (Signed)
" °   03/12/24 1935  Vitals  Temp (!) 97.5 F (36.4 C)  Pulse Rate 90  Resp 16  BP 115/66  SpO2 100 %  O2 Device Room Air  Weight (S)  67.2 kg (Bed Scale)  Type of Weight Post-Dialysis  Oxygen Therapy  Patient Activity (if Appropriate) In bed  Pulse Oximetry Type Continuous  Post Treatment  Dialyzer Clearance Clear  Hemodialysis Intake (mL) 0 mL  Liters Processed 90  Fluid Removed (mL) 1500 mL  Tolerated HD Treatment Yes  Post-Hemodialysis Comments Pt. SBP decrease and UF was decrease per order and no distress noted. Report call to 104M bedside Nurse.   Received patient in bed to unit.  Alert and oriented.  Informed consent signed and in chart.   TX duration:3.75  Patient tolerated well.  Transported back to the room  Alert, without acute distress.  Hand-off given to patient's nurse.   Access used: Yes Access issues: No  Total UF removed: 1500 Medication(s) given: See MAR Post HD VS: See Above Grid Post HD weight: 67.2 kg   [facility] "

## 2024-03-12 NOTE — Progress Notes (Signed)
 Occupational Therapy Session Note  Patient Details  Name: Tata Timmins MRN: [id number] Date of Birth: 03/28/1955  Today's Date: 03/12/2024 OT Missed Time: 30 Minutes Missed Time Reason: Patient fatigue   Pt missed 30 min due ot fatigue and not wanting to get out of bed for lunch or work on endurance and exercises. Pt left resting in the bed with call bell at her side.   Therapy Documentation Precautions:  Precautions Precautions: Fall, Sternal Recall of Precautions/Restrictions: Impaired Precaution/Restrictions Comments: cortak Restrictions Weight Bearing Restrictions Per Provider Order: No Other Position/Activity Restrictions: Cardiac sternal precautions General: General OT Amount of Missed Time: 30 Minutes PT Missed Treatment Reason: Patient fatigue   Claudene Nest [facility] 03/12/2024, 2:53 PM

## 2024-03-12 NOTE — Progress Notes (Signed)
 Physical Therapy Session Note  Patient Details  Name: Tracy Baker MRN: [id number] Date of Birth: 03-Jul-1955  Today's Date: 03/12/2024 PT Individual Time: 0732-0758 and 1030-1109 PT Individual Time Calculation (min): 26 min and 39 min PT Missed Time: 21 minutes PT Missed Time Reason: fatigue  Short Term Goals: Week 1:  PT Short Term Goal 1 (Week 1): pt will perform bed mobility with min A consistantly PT Short Term Goal 2 (Week 1): pt will perform bed<>chair transfers with LRAD and CGA PT Short Term Goal 3 (Week 1): pt will ambulate 21ft with LRAD and min A  Skilled Therapeutic Interventions/Progress Updates:   Treatment Session 1 Received pt semi-reclined in bed asleep. Upon wakening, pt reluctantly agreeable to PT treatment and denied any pain at rest (noted unna boots removed) but appears to have pain when standing. Session with emphasis on OOB tolerance, functional mobility/transfers, generalized strengthening and endurance, and standing balance/coordination. Donned non skid socks dependently (pt still appears to be tender on her feet) and pt transferred semi-reclined<>sitting R EOB with HOB elevated and mod A for trunk control with cues for adherence to sternal precautions. Pt required mod A to scoot hips to EOB and cues to avoid pushing with BUEs. Stood with RW and mod A and performed stand<>pivot into WC with RW and mod A. Pt demo poor motor planning/sequencing, RW safety, and difficulty weight shifting between R/L foot to step - required assist to safely pivot hips into WC. Set pt up in [facility] to eat breakfast (donned elevating legrests and placed towels under feet for comfort). Concluded session with pt sitting in WC eating breakfast with all needs within reach awaiting upcoming OT session.   Treatment Session 2 Received pt sitting in [facility] reporting urge to toilet. Pt agreeable to PT treatment, and reported pain in feet was okay (unrated). Pt performed stand<>pivot to/from toilet with  bedside commode over top using grab bar and min A (cues to avoid pulling with BUE). Pt required max A for clothing management and able to void Performed hygiene management without assist and sat in WC at sink and washed hands mod I. Prepared to go to gym, however pt declined and requested to return to bed due to 9/10 fatigue. Performed stand<>pivot WC<>bed with RW and min A and transitioned into supine with mod A for BLE management. Assisted with repositioning in bed for comfort with pillow under heels and behind back. Provided pt with HEP and educated on frequency/duration/technique for the following exercises: - Supine Ankle Pumps  - 1 x daily - 7 x weekly - 3 sets - 10 reps - Gluteal Sets  - 1 x daily - 7 x weekly - 2 sets - 10 reps - 5 hold - Supine Hip Abduction AROM  - 1 x daily - 7 x weekly - 3 sets - 10 reps - Supine March  - 1 x daily - 7 x weekly - 3 sets - 10 reps - Supine Straight Leg Raises  - 1 x daily - 7 x weekly - 3 sets - 10 reps - Hooklying Clamshell with Resistance  - 1 x daily - 7 x weekly - 3 sets - 10 reps - Supine Hip Adduction Isometric with Ball  - 1 x daily - 7 x weekly - 3 sets - 10 reps - 5 hold Pt politely declined working on bed level exercises. Discussed with treatment team making pt 15/7 due to inability to tolerate current therapy load. Concluded session with pt semi-reclined in bed, needs  within reach, and bed alarm on. 21 minutes missed of skilled physical therapy due to fatigue.   Therapy Documentation Precautions:  Precautions Precautions: Fall, Sternal Recall of Precautions/Restrictions: Impaired Precaution/Restrictions Comments: cortak Restrictions Weight Bearing Restrictions Per Provider Order: No Other Position/Activity Restrictions: Cardiac sternal precautions  Therapy/Group: Individual Therapy [name] [name] PT, DPT 03/12/2024, 6:50 AM

## 2024-03-12 NOTE — Patient Care Conference (Signed)
 Inpatient RehabilitationTeam Conference and Plan of Care Update Date: 03/12/2024   Time: 11:37 AM    Patient Name: Tracy Baker      Medical Record Number: [id number]  Date of Birth: January 27, 1956 Sex: Female         Room/Bed: 4M04C/4M04C-01 Payor Info: Payor: [employer] MEDICARE / Plan: [facility] MEDICARE / Product Type: *No Product type* /    Admit Date/Time:  03/10/2024  8:51 PM  Primary Diagnosis:  Debility  Hospital Problems: Principal Problem:   Debility    Expected Discharge Date: Expected Discharge Date: 03/25/24  Team Members Present: Physician leading conference: Dr. Sven Elks Social Worker Present: Waverly Gentry, LCSW-A Nurse Present: Barnie Ronde, RN PT Present: [name], PT OT Present: Lorrayne Fritter, OT     Current Status/Progress Goal Weekly Team Focus  Bowel/Bladder   Pt is continent of B&B, last BM 1/6   Pt to remain continent   Continue with offering assitance with toileting every 4-6 hours to continue continence    Swallow/Nutrition/ Hydration               ADL's   Max A UB, LB and toileting   Supervision   functional endurance, sternal precaution adherence, dynamic balance    Mobility   bed mobility mod A, transfers with RW min/mod A, unable to ambulate   supervision  barriers: pain in heels, dizziness, fatigue from HD, generalized weakness/deconditioning, adherance to sternal precautions, decreased OOB tolernace    Communication                Safety/Cognition/ Behavioral Observations               Pain   Pt is wihtout c/o pain   Pt with continue to remain pain free   Assess for pain every shift, offer pain meds as needed    Skin   Healing chest incision, bilateral healing neck sites from central lines, bilateral erythema of the heels and buttocks   Continue healing of post/op sites,  Assess post/op incisions every shift, continue to keep pressure off heels and buttocks      Discharge Planning:  Plans to discharge  home alone with limited family support. Will await follow-up therapy recommendations.   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  Patient with poor appetite on TF via cortrak with appetite stimulant. Functional progress limited by foot pain, defuse strength issues, fatigue and decreased initiation and lack of motivation; presenting similar to FTT with anoxic brain injury..  MD adding vitamin d  and B supplements and adjusted pain medication and changed to HS feedings with encouragement for increased po intake. Dietician consulted for recommendations. Rec therapy referral.  Patient on target to meet rehab goals: no, currently needs min - mod assist for transfers, max assist for upper body care, lower body and toileting. Has trouble adhering to sternal precautions with transfers and ADLs and has not attempted gait training yet.  *See Care Plan and progress notes for long and short-term goals.   Revisions to Treatment Plan:  Change to 15/7 therapy schedule   Teaching Needs: Sternal precautions, safety, skin care,medications, dietary modification, transfers, toileting, etc.   Current Barriers to Discharge: Home enviroment access/layout, Lack of/limited family support, and Behavior  Possible Resolutions to Barriers: SW to identify support at discharge Family education [facility] follow up services     Medical Summary Current Status: fatigue, decreased appetite, bilateral heel pain, s/p MVR  Barriers to  Discharge: Medical stability;Complicated Wound  Barriers to Discharge Comments: fatigue, decreased appetite, bilateral heel pain, s/p MVR Possible Resolutions to [employer] Focus: decreased to 15/7, D3 and B12 ordered, continue sore throat, continue to monitor her heels, continue megace , continue wound care, continue lyrica , continue amidarone and Eliquis    Continued Need for Acute Rehabilitation Level of Care: The patient requires daily  medical management by a physician with specialized training in physical medicine and rehabilitation for the following reasons: Direction of a multidisciplinary physical rehabilitation program to maximize functional independence : Yes Medical management of patient stability for increased activity during participation in an intensive rehabilitation regime.: Yes Analysis of laboratory values and/or radiology reports with any subsequent need for medication adjustment and/or medical intervention. : Yes   I attest that I was present, lead the team conference, and concur with the assessment and plan of the team.   Fredericka Sober B 03/12/2024, 1:24 PM

## 2024-03-12 NOTE — Plan of Care (Signed)
" °  Problem: Consults Goal: RH GENERAL PATIENT EDUCATION Description: See Patient Education module for education specifics. Outcome: Progressing Goal: Skin Care Protocol Initiated - if Braden Score 18 or less Description: If consults are not indicated, leave blank or document N/A Outcome: Progressing Goal: Nutrition Consult-if indicated Outcome: Progressing Goal: Diabetes Guidelines if Diabetic/Glucose > 140 Description: If diabetic or lab glucose is > 140 mg/dl - Initiate Diabetes/Hyperglycemia Guidelines & Document Interventions  Outcome: Progressing   Problem: RH BOWEL ELIMINATION Goal: RH STG MANAGE BOWEL WITH ASSISTANCE Description: STG Manage Bowel with mod I  Assistance. Outcome: Progressing Goal: RH STG MANAGE BOWEL W/MEDICATION W/ASSISTANCE Description: STG Manage Bowel with Medication with mod I Assistance. Outcome: Progressing   Problem: RH SKIN INTEGRITY Goal: RH STG SKIN FREE OF INFECTION/BREAKDOWN Description: Manage skin w min assist Outcome: Progressing Goal: RH STG MAINTAIN SKIN INTEGRITY WITH ASSISTANCE Description: STG Maintain Skin Integrity With Assistance. Outcome: Progressing Goal: RH STG ABLE TO PERFORM INCISION/WOUND CARE W/ASSISTANCE Description: STG Able To Perform Incision/Wound Care With min Assistance. Outcome: Progressing   Problem: RH SAFETY Goal: RH STG ADHERE TO SAFETY PRECAUTIONS W/ASSISTANCE/DEVICE Description: STG Adhere to Safety Precautions With cues Assistance/Device. Outcome: Progressing   Problem: RH PAIN MANAGEMENT Goal: RH STG PAIN MANAGED AT OR BELOW PT'S PAIN GOAL Description: Pain < 4 with prns Outcome: Progressing   Problem: RH KNOWLEDGE DEFICIT GENERAL Goal: RH STG INCREASE KNOWLEDGE OF SELF CARE AFTER HOSPITALIZATION Description: Patient and family will be able to manage care at discharge using educational resources for medication, skin care and dietary modification independently Outcome: Progressing   "

## 2024-03-12 NOTE — Progress Notes (Addendum)
 "                                                        PROGRESS NOTE   Subjective/Complaints: C/o throat pain/decreased appetite, discussed whether she would like to d/c Cortrak to stimulate oral appetite and she will think about this today  ROS: +decreased appetite   Objective:   No results found. No results for input(s): WBC, HGB, HCT, PLT in the last 72 hours. Recent Labs    03/10/24 0658  NA 133*  K 4.3  CL 93*  CO2 23  GLUCOSE 166*  BUN 139*  CREATININE 3.68*  CALCIUM  9.6    Intake/Output Summary (Last 24 hours) at 03/12/2024 0915 Last data filed at 03/11/2024 1841 Gross per 24 hour  Intake 340 ml  Output --  Net 340 ml     Wound 03/10/24 2051 Pressure Injury Sacrum Right Stage 2 -  Partial thickness loss of dermis presenting as a shallow open injury with a red, pink wound bed without slough. (Active)    Physical Exam: Vital Signs Blood pressure 102/63, pulse 72, temperature 97.9 F (36.6 C), temperature source Oral, resp. rate 16, height 5' 4 (1.626 m), weight 66.1 kg, SpO2 98%. Gen: no distress, normal appearing HEENT: oral mucosa pink and moist, NCAT, +NGT Cardio: Reg rate Chest: normal effort, normal rate of breathing Abd: soft, non-distended Ext: no edema Psych: pleasant, normal affect Skin: has chest wall incision, tunneled catheter on left chest wall, right buttock wound and bilateral heel wounds- c/d/i Neuro: Alert,4-/5 strength throughout, good attention, sensation is intact   Assessment/Plan: 1. Functional deficits which require 3+ hours per day of interdisciplinary therapy in a comprehensive inpatient rehab setting. Physiatrist is providing close team supervision and 24 hour management of active medical problems listed below. Physiatrist and rehab team continue to assess barriers to discharge/monitor patient progress toward functional and medical goals  Care Tool:  Bathing    Body parts bathed by patient: Right arm, Left arm, Chest,  Abdomen, Front perineal area, Face   Body parts bathed by helper: Buttocks, Right upper leg, Left upper leg, Right lower leg, Left lower leg     Bathing assist Assist Level: Moderate Assistance - Patient 50 - 74%     Upper Body Dressing/Undressing Upper body dressing   What is the patient wearing?: Pull over shirt    Upper body assist Assist Level: Maximal Assistance - Patient 25 - 49%    Lower Body Dressing/Undressing Lower body dressing      What is the patient wearing?: Incontinence brief, Pants     Lower body assist Assist for lower body dressing: Total Assistance - Patient < 25%     Toileting Toileting    Toileting assist Assist for toileting: Total Assistance - Patient < 25%     Transfers Chair/bed transfer  Transfers assist  Chair/bed transfer activity did not occur: Safety/medical concerns (pain, fatigue, dizziness)  Chair/bed transfer assist level: Moderate Assistance - Patient 50 - 74%     Locomotion Ambulation   Ambulation assist   Ambulation activity did not occur: Safety/medical concerns (pain, fatigue, dizziness)          Walk 10 feet activity   Assist  Walk 10 feet activity did not occur: Safety/medical concerns (pain, fatigue, dizziness)  Walk 50 feet activity   Assist Walk 50 feet with 2 turns activity did not occur: Safety/medical concerns (pain, fatigue, dizziness)         Walk 150 feet activity   Assist Walk 150 feet activity did not occur: Safety/medical concerns (pain, fatigue, dizziness)         Walk 10 feet on uneven surface  activity   Assist Walk 10 feet on uneven surfaces activity did not occur: Safety/medical concerns (pain, fatigue, dizziness)         Wheelchair     Assist Is the patient using a wheelchair?: Yes Type of Wheelchair: Manual Wheelchair activity did not occur: Safety/medical concerns (pain, fatigue, dizziness)         Wheelchair 50 feet with 2 turns  activity    Assist    Wheelchair 50 feet with 2 turns activity did not occur: Safety/medical concerns (pain, fatigue, dizziness)       Wheelchair 150 feet activity     Assist  Wheelchair 150 feet activity did not occur: Safety/medical concerns (pain, fatigue, dizziness)       Blood pressure 102/63, pulse 72, temperature 97.9 F (36.6 C), temperature source Oral, resp. rate 16, height 5' 4 (1.626 m), weight 66.1 kg, SpO2 98%.  Medical Problem List and Plan: 1. Functional deficits secondary to debility after prolonged hospital stay related to mitral valve vegetations and MV repair and ultimate replacement.             -patient may  shower             -ELOS/Goals: 10-14 days, supervision goals with PT, OT, SLP  Continue CIR Continue Eliquis   2. Hypotension: medications reviewed and is on amiodarone  and clonazepam - will discuss with patient risks and benefits of benzodiazepines, BP reviewed and has improved  3. Pain from HD: continue prn lidocaine    4. Insomnia: continue Remeron  and melatonin        5. Neuropsych/cognition: This patient is capable of making decisions on her own behalf.  6. Skin/Wound Care: Air mattress overlay for pressure relief.             --continue Ensure and prosource             --add Vitamin C and Zinc to promote wound healing.   7. Fluids/Electrolytes/Nutrition: Strict I/O.  --getting 1010 cc w/TF and schedule ensure. NO FR needed per  nephrology.    -transition to oral diet as able. Pt feels that appetite is improving.  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             --F/u with ID 01/06?-->discussed with Dr. Overton who will follow up with for input.   9. 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   10. AKI with dialysis initiated 02/16/24: On HD MWF at the end of the day to help with tolerance of therapy             --Cr at baseline but no signs of recovery so  far.              --CLIP on hold. Needs to be able to sit up in a chair for HD.   11. A fib: Monitor HR TID--continue amiodarone  and eliquis .   12. Anemia of critical illness:Being monitored and stable.              --thrombocytopenia has resolved.   13. CODE STATUS: FULL CODE  but NO CHEST COMPRESSIONS.              --nurse to place sign on door.   14. Fatigue: D3 and B12 ordered, decrease Seroquel  to 12.5mg  HS, change lyrica  to HS, decreased to 15/7    LOS: 2 days A FACE TO FACE EVALUATION WAS PERFORMED  Andrus Sharp P Aayansh Codispoti 03/12/2024, 9:15 AM     "

## 2024-03-12 NOTE — Discharge Instructions (Addendum)
 Inpatient Rehab Discharge Instructions  Tracy Baker Discharge date and time:  04/02/24  Activities/Precautions/ Functional Status: Activity: no lifting, driving, or strenuous exercise  till cleared by MD Diet: regular diet Wound Care: keep wound clean and dry   Functional status:  ___ No restrictions     ___ Walk up steps independently ___ 24/7 supervision/assistance   ___ Walk up steps with assistance _X__ Intermittent supervision/assistance  ___ Bathe/dress independently ___ Walk with walker     ___ Bathe/dress with assistance ___ Walk Independently    __NO_ Showers  ___ Walk with assistance    ___ Shower with assistance _X__ No alcohol     ___ Return to work/school ________  Special Instructions:   COMMUNITY REFERRALS UPON DISCHARGE:    Home Health:   PT     OT                        Agency: [facility] Phone: [phone]   Medical Equipment/Items Ordered: 3-1 BSC and rollator                                                 Agency/Supplier: [employer]  My questions have been answered and I understand these instructions. I will adhere to these goals and the provided educational materials after my discharge from the hospital.  Patient/Caregiver Signature _______________________________ Date __________  Clinician Signature _______________________________________ Date __________  Please bring this form and your medication list with you to all your follow-up doctor's appointments.

## 2024-03-12 NOTE — Progress Notes (Signed)
 Physical Therapy Note  Patient Details  Name: Tracy Baker MRN: [id number] Date of Birth: [birth year] Today's Date: 03/12/2024   Pt's plan of care adjusted to 15/7 after speaking with care team and discussed with MD in team conference as pt currently unable to tolerate current therapy schedule with OT, PT, and SLP.     [name] Therisa Zaunegger PT, DPT 03/12/2024, 11:10 AM

## 2024-03-13 DIAGNOSIS — R5381 Other malaise: Secondary | ICD-10-CM | POA: Diagnosis not present

## 2024-03-13 LAB — CBC WITH DIFFERENTIAL/PLATELET
Abs Immature Granulocytes: 0.07 K/uL (ref 0.00–0.07)
Basophils Absolute: 0 K/uL (ref 0.0–0.1)
Basophils Relative: 0 %
Eosinophils Absolute: 0.1 K/uL (ref 0.0–0.5)
Eosinophils Relative: 1 %
HCT: 37.2 % (ref 36.0–46.0)
Hemoglobin: 12.4 g/dL (ref 12.0–15.0)
Immature Granulocytes: 1 %
Lymphocytes Relative: 12 %
Lymphs Abs: 1.3 K/uL (ref 0.7–4.0)
MCH: 31.9 pg (ref 26.0–34.0)
MCHC: 33.3 g/dL (ref 30.0–36.0)
MCV: 95.6 fL (ref 80.0–100.0)
Monocytes Absolute: 0.8 K/uL (ref 0.1–1.0)
Monocytes Relative: 8 %
Neutro Abs: 8.6 K/uL — ABNORMAL HIGH (ref 1.7–7.7)
Neutrophils Relative %: 78 %
Platelets: 193 K/uL (ref 150–400)
RBC: 3.89 MIL/uL (ref 3.87–5.11)
RDW: 17.6 % — ABNORMAL HIGH (ref 11.5–15.5)
WBC: 10.9 K/uL — ABNORMAL HIGH (ref 4.0–10.5)
nRBC: 0 % (ref 0.0–0.2)

## 2024-03-13 LAB — BASIC METABOLIC PANEL WITH GFR
Anion gap: 17 — ABNORMAL HIGH (ref 5–15)
BUN: 72 mg/dL — ABNORMAL HIGH (ref 8–23)
CO2: 23 mmol/L (ref 22–32)
Calcium: 10 mg/dL (ref 8.9–10.3)
Chloride: 91 mmol/L — ABNORMAL LOW (ref 98–111)
Creatinine, Ser: 2.68 mg/dL — ABNORMAL HIGH (ref 0.44–1.00)
GFR, Estimated: 19 mL/min — ABNORMAL LOW
Glucose, Bld: 137 mg/dL — ABNORMAL HIGH (ref 70–99)
Potassium: 4.5 mmol/L (ref 3.5–5.1)
Sodium: 130 mmol/L — ABNORMAL LOW (ref 135–145)

## 2024-03-13 LAB — MAGNESIUM: Magnesium: 2.2 mg/dL (ref 1.7–2.4)

## 2024-03-13 LAB — GLUCOSE, CAPILLARY
Glucose-Capillary: 224 mg/dL — ABNORMAL HIGH (ref 70–99)
Glucose-Capillary: 126 mg/dL — ABNORMAL HIGH (ref 70–99)
Glucose-Capillary: 121 mg/dL — ABNORMAL HIGH (ref 70–99)
Glucose-Capillary: 127 mg/dL — ABNORMAL HIGH (ref 70–99)

## 2024-03-13 MED ORDER — SODIUM CHLORIDE 0.9 % IV SOLN: INTRAVENOUS | Status: DC

## 2024-03-13 MED ORDER — SODIUM CHLORIDE 1 G PO TABS
1.0000 g | ORAL_TABLET | Freq: Once | ORAL | Status: AC
  Administered 2024-03-13: 1 g via ORAL
  Filled 2024-03-13: qty 1

## 2024-03-13 NOTE — IPOC Note (Signed)
 Overall Plan of Care [facility]) Patient Details Name: Kylin Genna MRN: [id number] DOB: 06/19/1955  Admitting Diagnosis: Debility  Hospital Problems: Principal Problem:   Debility     Functional Problem List: Nursing Pain, Bowel, Safety, Endurance, Medication Management, Nutrition, Skin Integrity  PT Balance, Behavior, Edema, Endurance, Pain, Skin Integrity, Nutrition  OT Balance, Edema, Endurance, Motor, Nutrition, Pain, Skin Integrity  SLP Nutrition  TR         Basic ADLs: OT Bathing, Dressing, Toileting     Advanced  ADLs: OT None     Transfers: PT Bed Mobility, Bed to Chair, Car, Occupational Psychologist, Research Scientist (life Sciences): PT Ambulation, Educational Psychologist, Psychologist, Prison And Probation Services     Additional Impairments: OT None  SLP Swallowing      TR      Anticipated Outcomes Item Anticipated Outcome  Self Feeding Mod I  Swallowing  Mod I   Basic self-care  Marketing Executive Transfers Supervision  Bowel/Bladder  manage bowel w mod I assist  Transfers  supervision with LRAD  Locomotion  supervision with LRAD  Communication     Cognition     Pain  PAin < 4 with prns  Safety/Judgment  manage safety w cues   Therapy Plan: PT Intensity: Minimum of 1-2 x/day ,45 to 90 minutes PT Frequency: 5 out of 7 days PT Duration Estimated Length of Stay: 12-14 days OT Intensity: Minimum of 1-2 x/day, 45 to 90 minutes OT Frequency: 5 out of 7 days OT Duration/Estimated Length of Stay: 14-18 days SLP Intensity: Minumum of 1-2 x/day, 30 to 90 minutes SLP Frequency: 1 to 3 out of 7 days SLP Duration/Estimated Length of Stay: 12-14 days   Team Interventions: Nursing Interventions Patient/Family Education, Disease Management/Prevention, Pain Management, Bowel Management, Medication Management, Discharge Planning, Skin Care/Wound Management  PT interventions Ambulation/gait training, Discharge planning, Functional mobility training, Therapeutic  Activities, Psychosocial support, Visual/perceptual remediation/compensation, Balance/vestibular training, Disease management/prevention, Neuromuscular re-education, Skin care/wound management, Therapeutic Exercise, Wheelchair propulsion/positioning, Cognitive remediation/compensation, DME/adaptive equipment instruction, Pain management, Splinting/orthotics, UE/LE Strength taining/ROM, Firefighter, Equities Trader education, Museum/gallery curator, UE/LE Coordination activities  OT Interventions Warden/ranger, Discharge planning, Pain management, Self Care/advanced ADL retraining, Therapeutic Activities, UE/LE Coordination activities, Disease mangement/prevention, Functional mobility training, Patient/family education, Skin care/wound managment, Therapeutic Exercise, DME/adaptive equipment instruction, Neuromuscular re-education, UE/LE Strength taining/ROM, Wheelchair propulsion/positioning  SLP Interventions Dysphagia/aspiration precaution training, Patient/family education, Internal/external aids  TR Interventions    SW/CM Interventions Discharge Planning, Psychosocial Support, Patient/Family Education, Disease Management/Prevention   Barriers to Discharge MD  Medical stability  Nursing Decreased caregiver support, Home environment access/layout 1 level 5 ste left rail solo; dtr and sister to assist prn  PT Decreased caregiver support, Home environment access/layout, Wound Care, Hemodialysis, Weight bearing restrictions, Other (comments) sternal precautions, 5 STE with L handrail, wound care, fatigue from HD, dizziness/vertigo, and pain in bilateral heels  OT Home environment access/layout, Inaccessible home environment, Nutrition means, Lack of/limited family support    SLP      SW Lack of/limited family support Lack of family support - limited assistance from children   Team Discharge Planning: Destination: PT-Home ,OT- Home , SLP-Home Projected Follow-up: PT-Home health PT,  OT-  Home health OT, SLP-None Projected Equipment Needs: PT-To be determined, OT- To be determined, SLP-None recommended by SLP Equipment Details: PT-has shower chair, OT-  Patient/family involved in discharge planning: PT- Patient,  OT-Patient, SLP-Patient  MD ELOS: 15 days Medical Rehab Prognosis:  Excellent Assessment: The patient  has been admitted for CIR therapies with the diagnosis of debility 2/2 prolonged hospital stay related ot mitral valve replacement. The team will be addressing functional mobility, strength, stamina, balance, safety, adaptive techniques and equipment, self-care, bowel and bladder mgt, patient and caregiver education.Goals have been set at supervision. Anticipated discharge destination is home.        See Team Conference Notes for weekly updates to the plan of care

## 2024-03-13 NOTE — Progress Notes (Signed)
 Initial Nutrition Assessment  DOCUMENTATION CODES:   Severe malnutrition in context of chronic illness  INTERVENTION:  Discontinue Cortrak and tube feeds given minimal benefit from current regimen and pt's issues with throat pain  Encouraged improvements in PO intake now that tube is removed and throat pain should improve Can add snacks BID at pt request (pt currently does not want snacks)  Encouraged family to bring in outside food, smoothies, shakes to help pt's intake If family brings in strawberry syrup, pt can request vanilla Ensure shake from floor stock to drink but otherwise does not want them ordered as part of regimen  Added Magic cup TID with meals, each supplement provides 290 kcal and 9 grams of protein  Pt refuses most ONS (RD team has previously tried Ensure, [employer], [employer], Juven and [employer])  NUTRITION DIAGNOSIS:   Severe Malnutrition related to chronic illness as evidenced by moderate muscle depletion, severe muscle depletion, moderate fat depletion.  GOAL:   Patient will meet greater than or equal to 90% of their needs  MONITOR:   PO intake, Supplement acceptance, Labs  REASON FOR ASSESSMENT:   Consult Assessment of nutrition requirement/status, Enteral/tube feeding initiation and management, Poor PO  ASSESSMENT:   69 yo female admitted with hx of Calbicans candidemia, nephrolithiasis s/p ureteral stent and lithotripsy with recent admission for MV endocarditis requiring MVR, post-op course complicated by shock post-op, junctional bradycardia, afib with RVR, and pneumonia requiring ECMO. Hospital course complicated by poor PO intake requiring Cortrak placement, AKI requirining CRRT then transitioned to HD, and fatigue. Admitted to CIR to work on debility.  RD team familiar with pt during acute care hospitalization. RD team followed pt closely for poor PO intake and pt previously declined PEG but continued to decline in the  setting of poor PO. Pt had Cortrak placed to supplement feeds per MD request. Pt does not like ONS including Ensure, Boost Breeze, Mighty Shakes and [employer]. RD team tried all supplements but continued refusal led to tube feeds being continued.   Pt now in CIR and still has poor intake. Spoke with pt extensively at bedside about barriers to eating and fears about getting Cortrak taken out. Pt recently has been relying on Cortrak to take medications as she feels she cannot swallow them due to throat pain. Pt also worried about pain when removing Cortrak. Discussed quickness of Cortrak removal and encouraged pt to allow care team to remove tube as it may be causing more pain when it is no longer needed. Discussed that pt was only receiving 40% of calorie needs at night with tube feeds and recommended stopping tube feeds to encourage pt to eat more. Discussed that most medications can be crushed and given with applesauce if throat pain persists but encouraged pt to try to work on taking oral meds.   Pt currently does not have enough energy to actively participate in therapies. Since pt does not wish for long term nutrition support, it is best to discontinue Cortrak and tube feeds. Encouraged pt once Cortrak is out to focus on meals and work hard on eating more as throat pain lessens. Encouraged drinking fluids to lubricate throat prior to PO intake. Pt continues to refuse ONS, requested Ensure to be discontinued. Encouraged pt to ask family to bring in any food she wants. Encouraged use of smoothies/ outside shakes to meet needs. If pt's family brings in strawberry syrup, pt can request vanilla shake from floor stock but so far  has been refusing Ensures.  Calorie count initiated to assess intake now that tube feeds discontinued.   Nutrition focused physical exam shows moderate fat depletion and moderate to severe muscle depletions. Pt has also only been receiving supplemental tube feeds  (meeting <50% of estimated needs) for the last month with continued poor PO intake. Overall intake estimated to be meeting < 75% of estimated needs for >/= 1 month which in combination with muscle depletions meets criteria for severe malnutrition.   Average Meal Completion: 1/6-1/8: 31% average intake x 4 recorded meals  Nutritionally Relevant Medications: Vitamin D3 1000 units daily SSI 0-9 units QID Megace  BID Remeron  daily Protonix  Zoloft    Labs reviewed: CBG x 24 h: 126-224 mg/dL Sodium 869/Ro 91 BUN 27/ Cr 2.68 Phos 5.7 (1/7) A1c 5.4  Admit weight: 62 kg Current weight: 67.2 kg  HD Plan:  MWF schedule: 3.5h, no heparin . TDC.  Last HD: 1/7  UF removed: 1.5 L Next HD: 1/9  NUTRITION - FOCUSED PHYSICAL EXAM:  Flowsheet Row Most Recent Value  Orbital Region Moderate depletion  Upper Arm Region Mild depletion  Thoracic and Lumbar Region Unable to assess  Buccal Region Moderate depletion  Temple Region Moderate depletion  Clavicle Bone Region Severe depletion  Clavicle and Acromion Bone Region Severe depletion  Scapular Bone Region Severe depletion  Dorsal Hand Severe depletion  Patellar Region Moderate depletion  Anterior Thigh Region Moderate depletion  Posterior Calf Region Unable to assess  Edema (RD Assessment) Mild  Hair Reviewed  Eyes Reviewed  Mouth Reviewed  Skin Reviewed  Nails Reviewed    Diet Order:   Diet Order             Diet regular Room service appropriate? Yes; Fluid consistency: Thin  Diet effective now                   EDUCATION NEEDS:   Education needs have been addressed  Skin:  Skin Integrity Issues:: Stage II Stage II: sacrum Incisions: chest  Last BM:  1/6 type 5  Height:   Ht Readings from Last 1 Encounters:  03/10/24 5' 4 (1.626 m)    Weight:   Wt Readings from Last 1 Encounters:  03/12/24 (S) 67.2 kg    Ideal Body Weight:  54.55 kg  BMI:  Body mass index is 25.42 kg/m.  Estimated Nutritional  Needs:   Kcal:  1900-2100  Protein:  85-105g  Fluid:  2L    Josette Glance, MS, RDN, LDN Clinical Dietitian I Please reach out via secure chat

## 2024-03-13 NOTE — Progress Notes (Signed)
 "                                                        PROGRESS NOTE   Subjective/Complaints: No new complaints this morning Patient states she ate better yesterday and would like Cortrak removed- order placed and discussed with nursing  ROS: improved appetite   Objective:   No results found. Recent Labs    03/12/24 1142 03/12/24 1530  WBC 10.3 9.5  HGB 12.0 10.3*  HCT 35.7* 31.0*  PLT 158 134*   Recent Labs    03/12/24 1142 03/12/24 1530  NA 127* 128*  K 4.9 4.8  CL 87* 89*  CO2 21* 20*  GLUCOSE 146* 118*  BUN 121* 123*  CREATININE 3.94* 3.94*  CALCIUM  10.0 9.8    Intake/Output Summary (Last 24 hours) at 03/13/2024 0957 Last data filed at 03/12/2024 1935 Gross per 24 hour  Intake 240 ml  Output 1500 ml  Net -1260 ml     Wound 03/10/24 2051 Pressure Injury Sacrum Right Stage 2 -  Partial thickness loss of dermis presenting as a shallow open injury with a red, pink wound bed without slough. (Active)    Physical Exam: Vital Signs Blood pressure 93/61, pulse 79, temperature 98.1 F (36.7 C), resp. rate 18, height 5' 4 (1.626 m), weight (S) 67.2 kg, SpO2 98%. Gen: no distress, normal appearing HEENT: oral mucosa pink and moist, NCAT, +NGT Cardio: Reg rate Chest: normal effort, normal rate of breathing Abd: soft, non-distended Ext: no edema Psych: pleasant, normal affect Skin: has chest wall incision, tunneled catheter on left chest wall, right buttock wound and bilateral heel wounds- c/d/i Neuro: Alert,4-/5 strength throughout, good attention, sensation is intact, stable 1/8   Assessment/Plan: 1. Functional deficits which require 3+ hours per day of interdisciplinary therapy in a comprehensive inpatient rehab setting. Physiatrist is providing close team supervision and 24 hour management of active medical problems listed below. Physiatrist and rehab team continue to assess barriers to discharge/monitor patient progress toward functional and medical  goals  Care Tool:  Bathing    Body parts bathed by patient: Right arm, Left arm, Chest, Abdomen, Front perineal area, Face   Body parts bathed by helper: Buttocks, Right upper leg, Left upper leg, Right lower leg, Left lower leg     Bathing assist Assist Level: Moderate Assistance - Patient 50 - 74%     Upper Body Dressing/Undressing Upper body dressing   What is the patient wearing?: Pull over shirt    Upper body assist Assist Level: Maximal Assistance - Patient 25 - 49%    Lower Body Dressing/Undressing Lower body dressing      What is the patient wearing?: Incontinence brief, Pants     Lower body assist Assist for lower body dressing: Total Assistance - Patient < 25%     Toileting Toileting    Toileting assist Assist for toileting: Total Assistance - Patient < 25%     Transfers Chair/bed transfer  Transfers assist  Chair/bed transfer activity did not occur: Safety/medical concerns (pain, fatigue, dizziness)  Chair/bed transfer assist level: Moderate Assistance - Patient 50 - 74%     Locomotion Ambulation   Ambulation assist   Ambulation activity did not occur: Safety/medical concerns (pain, fatigue, dizziness)          Walk 10  feet activity   Assist  Walk 10 feet activity did not occur: Safety/medical concerns (pain, fatigue, dizziness)        Walk 50 feet activity   Assist Walk 50 feet with 2 turns activity did not occur: Safety/medical concerns (pain, fatigue, dizziness)         Walk 150 feet activity   Assist Walk 150 feet activity did not occur: Safety/medical concerns (pain, fatigue, dizziness)         Walk 10 feet on uneven surface  activity   Assist Walk 10 feet on uneven surfaces activity did not occur: Safety/medical concerns (pain, fatigue, dizziness)         Wheelchair     Assist Is the patient using a wheelchair?: Yes Type of Wheelchair: Manual Wheelchair activity did not occur: Safety/medical concerns  (pain, fatigue, dizziness)         Wheelchair 50 feet with 2 turns activity    Assist    Wheelchair 50 feet with 2 turns activity did not occur: Safety/medical concerns (pain, fatigue, dizziness)       Wheelchair 150 feet activity     Assist  Wheelchair 150 feet activity did not occur: Safety/medical concerns (pain, fatigue, dizziness)       Blood pressure 93/61, pulse 79, temperature 98.1 F (36.7 C), resp. rate 18, height 5' 4 (1.626 m), weight (S) 67.2 kg, SpO2 98%.  Medical Problem List and Plan: 1. Functional deficits secondary to debility after prolonged hospital stay related to mitral valve vegetations and MV repair and ultimate replacement.             -patient may  shower             -ELOS/Goals: 10-14 days, supervision goals with PT, OT, SLP  Continue CIR Continue Eliquis   2. Hypotension: medications reviewed and is on amiodarone  and clonazepam - will discuss with patient risks and benefits of benzodiazepines, BP reviewed and has improved  3. Bilateral heel pain: Lyrica  25mg  scheduled HS   4. Insomnia: continue Remeron  and melatonin        5. Neuropsych/cognition: This patient is capable of making decisions on her own behalf.  6. Skin/Wound Care: Air mattress overlay for pressure relief.             --continue Ensure and prosource             --add Vitamin C  and Zinc to promote wound healing.   7. Decreased appetite: discussed with patient that she feels this is improving, will d/c cortrak, continue calorie count, continue feeding supplements NO FR needed per  nephrology.    -transition to oral diet as able. Pt feels that appetite is improving.  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             --F/u with ID 01/06?-->discussed with Dr. Overton who will follow up with for input.   9. 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   10. AKI with dialysis initiated 02/16/24: On  HD MWF at the end of the day to help with tolerance of therapy             --Cr at baseline but no signs of recovery so far.              --CLIP on hold. Needs to be able to sit up in a chair for HD.   11. A fib: Monitor HR TID--continue amiodarone  and eliquis .  12. Anemia of critical illness:Being monitored and stable.              --thrombocytopenia has resolved.   13. CODE STATUS: FULL CODE but NO CHEST COMPRESSIONS.              --nurse to place sign on door.   14. Fatigue: D3 and B12 ordered, d/c seroquel , change lyrica  to HS, decreased to 15/7  15. Hyponatremia: messaged nephrology to see if we can give her 1 time sodium chloride  tablet today    LOS: 3 days A FACE TO FACE EVALUATION WAS PERFORMED  Sven SQUIBB Lonny Eisen 03/13/2024, 9:57 AM     "

## 2024-03-13 NOTE — Progress Notes (Signed)
 " [facility] Progress Note    Assessment/ Plan:   Dialysis dependent AKI, initiated 02/16/2024: Creatinine normal at baseline Currently on MWF schedule: 3.5h, no heparin . TDC. Next HD Friday have increased rx time d/t significant azotemia between rx Following UOP and labs and intradialytic window for any suggestive GFR recovery, none to date Currently using Left IJ TDC placed on 12/22 with IR.   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 On antifungal therapy  Anemia -transfuse PRN for Hgb <7; Hb 10s  Hyponatremia: mild and intermittent.  Manage volume with dialysis, na/fluid restriction  Will follow.    Subjective:    Tolerated HD yesterday, 1.5L removed.  Pt reports she tolerated well, no cramping or dizziness.      Objective:   BP 93/61 (BP Location: Left Arm)   Pulse 79   Temp 98.1 F (36.7 C)   Resp 18   Ht 5' 4 (1.626 m)   Wt (S) 67.2 kg Comment: Bed Scale  SpO2 98%   BMI 25.42 kg/m   Intake/Output Summary (Last 24 hours) at 03/13/2024 1359 Last data filed at 03/12/2024 1935 Gross per 24 hour  Intake --  Output 1500 ml  Net -1500 ml    Weight change: 6.947 kg  Physical Exam: Gen: tired appearing, NAD CVS: normal rate, no rub Resp: bl chest rise, normal wob Abd: soft Ext: No significant edema noted Dialysis access:IJ tunneled  HD cath  Imaging: No results found.      Labs: BMET Recent Labs  Lab 03/07/24 0508 03/08/24 0500 03/09/24 0435 03/10/24 0658 03/12/24 1142 03/12/24 1530 03/13/24 1027  NA 131* 132* 133* 133* 127* 128* 130*  K 4.7 4.1 4.2 4.3 4.9 4.8 4.5  CL 92* 91* 92* 93* 87* 89* 91*  CO2 21* 25 23 23  21* 20* 23  GLUCOSE 147* 185* 162* 166* 146* 118* 137*  BUN 102* 71* 108* 139* 121* 123* 72*  CREATININE 3.27* 2.80* 3.51* 3.68* 3.94* 3.94* 2.68*  CALCIUM  9.8 9.6 9.7 9.6 10.0 9.8 10.0  PHOS 6.6*  5.7* 6.6* 6.5*  --  5.7*  --    CBC Recent Labs  Lab 03/07/24 0730 03/12/24 1142 03/12/24 1530 03/13/24 1027  WBC 6.0 10.3 9.5 10.9*  NEUTROABS 3.8 8.3*  --  8.6*  HGB 10.9* 12.0 10.3* 12.4  HCT 32.1* 35.7* 31.0* 37.2  MCV 95.5 95.2 98.4 95.6  PLT 155 158 134* 193    Medications:     amiodarone   200 mg Oral Daily   apixaban   5 mg Oral BID   cholecalciferol   1,000 Units Oral Daily   clonazePAM   0.5 mg Oral QHS   feeding supplement  237 mL Oral TID BM   feeding supplement ([employer]OSource TF20)  60 mL Per Tube BID   feeding supplement (VITAL 1.5 CAL)  1,000 mL Per Tube Q24H   fluconazole   400 mg Oral Q supper   Followed by   NOREEN ON 05/05/2024] fluconazole   200 mg Oral Daily   Gerhardt's butt cream   Topical BID   hydrocerin   Topical BID   insulin  aspart  0-9 Units Subcutaneous TID AC & HS   kidney failure book   Does not apply Once   megestrol   80 mg Oral BID   mirtazapine   30 mg Oral QHS   mouth rinse  15 mL Mouth  Rinse 4 times per day   pantoprazole   40 mg Oral Daily   pregabalin   25 mg Oral QHS   sertraline   25 mg Oral QHS       "

## 2024-03-13 NOTE — Progress Notes (Signed)
 Physical Therapy Session Note  Patient Details  Name: Tracy Baker MRN: [id number] Date of Birth: 1955-06-23  Today's Date: 03/13/2024 PT Individual Time: 9169-9044, 1130-1200 PT Individual Time Calculation (min): 85 min, 30   Short Term Goals: Week 1:  PT Short Term Goal 1 (Week 1): pt will perform bed mobility with min A consistantly PT Short Term Goal 2 (Week 1): pt will perform bed<>chair transfers with LRAD and CGA PT Short Term Goal 3 (Week 1): pt will ambulate 31ft with LRAD and min A  Skilled Therapeutic Interventions/Progress Updates:      Treatment Session 1   Pt supine asleep in bed upon arrival. Pt awoken and agreeable to therapy. Pt denies any pain.    PT present early for session to make up missed minutes.   Of note: pt not wearing B LE prevalon boots. Pt endorses ability to tolerate however no one put them on. PT provided handout on bulleting board at [facility] as visual reminder for staff. PT also encouraged pt to self advocate to have them put on. Education provided to pt regarding purpose and benefits of wear. Pt verablized understanding and agreeable.   Donned socks B LE.   Pt able to recall sternal precautions but needs max verbal cues for implementation with functional mobility.   Supine to sit with log roll technique to R with mod A with HOB elevated. Pt required mod A with use of chuck pad for reciprocal scoot EOB - pt demonstrates lateral trunk lean to R and pt attempting to abduct R UE - verbal cues provided for correction for adherence to sternal precautions.   Pt attempted sit to stand with RW and mod A however pt self discontinuing and laying down in bed 2/2 fatigue and urgency with BM. Pt required mod A for management of B LE for sit to supine with use of log roll technique. Pt attempting to roll on back with R UE on bed rail-vebral cues provided for correction.   PT doffed pants and placed bed pan with total A while pt rolled with mod A.   Pt required  increased time on bed pan. Pt continent of bowel and bladder. Pt performed pericare with total A. Pt perfomred rolling B with heavy progressing to light mod  A with repetition, max verbal cues provided for not reaching for bed rail.   Supine to sit EOB from L sidleying with mod A - pt endorses dizziness from baseline vertigo when laying on L side - subsided with prolonged positioning. Sitting EOB pt deneis any dizziness. Sit to stand with RW and mod A. Pt only able to stand for a few seconds before sitting 2/2 dizziness. Sit to supine with total A. Scooting HOB with +2 total A however tp able to complete bridge for repositioning of chuck pad.   Pt endorses dizziness subsided with laying down. Assessed vitals while laying down BP 107/65 HR 79.   PT positioned bed in chair position as tolerated by pt. Donned abdominal binder with total A while pt seated upright.   Pt performed the following seated therex while in chair position:    1x10 B LE LAQ  1x15 B LE ankle pumps - active assisted    PT donned B LE PRAFO between sessions to float heels.   Pt agreeable to sit up in chiar position between sessions. Notified nurse, and tech of pt position.    Treatment Session 2   Pt in bed upon arrival. Pt agreeable to therapy. Pt  endorses 8/10 throat pain, notified nurse. Plan to remove coretrak today. Pt endorses previously given cough drop however no relief.   Pt endorses discomfort on buttocks. Pt performed rolling to R with min A, pt sustained this position for ~3 min for pressure relief. Discussed possible strategies for pt to self perform pressure relief as needed with adherence to sternal precautions. Pt trailed glute bridge however only able to obtain minimal clearance and only able to sustain for a few seconds. PT recommending staff assist with repositioning q2h at night for adequate pressure relief. Notified nurse, PA, MD. MD updated orders to reflect this. PT provided handout on bulletin bed above  bed as visual reminder to staff.   Education provided regarding importance of getting OOB for adequate pressure releif. Pt agreeable to getting up in [facility] for lunch   Assessed vitals:   Pt no longer wearing abdominal binder  Supine with HOB elevated BP 91/56 HR 75 Seated EOB BP 82/62 HR 79 - pt endorsing lightheadedness and need to lay down.   Notified MD and PA. Recommending use of B LE ace wrap.   Pt supine in bed with all needs within reach and bed alarm on.    Therapy Documentation Precautions:  Precautions Precautions: Fall, Sternal Recall of Precautions/Restrictions: Impaired Precaution/Restrictions Comments: cortak Restrictions Weight Bearing Restrictions Per Provider Order: No Other Position/Activity Restrictions: Cardiac sternal precautions  Therapy/Group: Individual Therapy  [facility] Doreene Orris, [place], DPT  03/13/2024, 7:54 AM

## 2024-03-13 NOTE — Progress Notes (Signed)
 Occupational Therapy Session Note  Patient Details  Name: Tracy Baker MRN: [id number] Date of Birth: August 11, 1955  Session 1 Today's Date: 03/13/2024 OT Individual Time: 8967-8882 OT Individual Time Calculation (min): 45 min  OT missed time: 15 minutes  Missed time reason: Pt endorsing increased pain in bottom, OT provided pain intervention; see note below. OT to make up time when able.   Session 2 Today's Date: 03/13/2024 OT Individual Time: 8498-8472 OT Individual Time Calculation (min): 26 min    Short Term Goals: Week 1:  OT Short Term Goal 1 (Week 1): Pt will complete sit > stand with CGA using LRAD OT Short Term Goal 2 (Week 1): Pt will complete toilet transfer with min A using LRAD OT Short Term Goal 3 (Week 1): Pt will complete 2/3 toileting steps with CGA for balance OT Short Term Goal 4 (Week 1): Pt will adhere to sternal precautions 100% for 2 consecutive sessions with supervision  Skilled Therapeutic Interventions/Progress Updates:  Session 1: Skilled OT session completed to address ADL retraining and pressure relief. Pt received supine in bed, agreeable to participate in therapy. Pt reports 7/10 pain in bottom at sacral wound, OT provided pain intervention by repositioning.   Pt respectfully declining OOB therapy d/t pain. OT provided Max A to roll R>L in bed to assist with pressure relief. Pt attempts to complete task without A by grabbing bedrail; OT prompted pt to discontinue task d/t sternal precautions. OT prompted pt to recall sternal precautions, pt verbalized no pulling, and no lifting anything over 10lbs, OT directed pt to handout in room with precautions for visual reminder. OT provided education on advocating for pressure relief assistance from staff to decrease risk for pressure sores and assist with pain mgmt, pt verbalized understanding. Pt completed bed level grooming and UB bathing with set-up A to retrieve wash cloth. Pt completed UB dressing with Mod A to weave  in BUE and pull shirt down in back. Pt reporting increased pain sitting in WC, OT re-inflated roho cushion. OT unable to adjust roho to pt comfort level d/t pt continuing to decline OOB tx. Session discontinued, pt supine in bed with bed alarm on and all needs within reach.    Session 2: Skilled OT session completed to address ADL retraining. Pt received supine in bed, agreeable to participate in therapy. Pt reports 4/10 pain in bottom, OT provided pain intervention by repositioning.   Pt requesting to void at bed level, pt completed bilateral rolling with Mod A, VC to adhere to sternal precautions. OT provided bed pan, continent of bowel, pt completed toileting hygiene with Max A to manage clothing and wipe bottom, pt able to complete anterior peri care with HOB elevated. RN present to A with scooting HOB, pt able to bridge; however, task discontinued d/t pt attempt to use BUE to push up in bed. OT reinforced sternal precautions, pt verbalized understanding. OT provided education on modifying toileting and re-locating BSC to EOB to promote independence with toileting, pt verbalized understanding; however, declining to perform transfer. OT to f/u with primary OT before modifying room and clearing transfer with staff. Pt supine in bed with all needs in reach.   Therapy Documentation Precautions:  Precautions Precautions: Fall, Sternal Recall of Precautions/Restrictions: Impaired Precaution/Restrictions Comments: cortak Restrictions Weight Bearing Restrictions Per Provider Order: No Other Position/Activity Restrictions: Cardiac sternal precautions   Therapy/Group: Individual Therapy  Dejan Angert Woods-Chance, MS, OTR/L 03/13/2024, 7:56 AM

## 2024-03-13 NOTE — Progress Notes (Signed)
 Patient very dizzy and lightheaded with therapy this am--was able to tolerate sitting for 30 minutes only with BP drop to 91/56-->82/62. Discussed with Dr. Geralynn and IVF 100 cc/hr X 5 added for hydration.

## 2024-03-13 NOTE — Evaluation (Signed)
 Recreational Therapy Assessment and Plan  Patient Details  Name: Naysha Sholl MRN: [id number] Date of Birth: 01/31/1956 Today's Date: 03/13/2024  Rehab Potential:  Good ELOS:   d/c 1/20  Assessment Hospital Problem: Principal Problem:   Debility     Past Medical History:      Past Medical History:  Diagnosis Date   Anxiety     Candidemia (HCC) 12/2023   Nephrolithiasis      lithotripsy 12/14/23 w/stent   Pyelonephritis 12/2023   Thyroid  disease     Vertigo          Past Surgical History:       Past Surgical History:  Procedure Laterality Date   APPENDECTOMY       AUGMENTATION MAMMAPLASTY Bilateral 1981    removed in 2006   BREAST IMPLANT REMOVAL Bilateral 2007   CARDIOVERSION N/A 01/22/2024    Procedure: CARDIOVERSION;  Surgeon: Rolan Ezra RAMAN, MD;  Location: [facility] INVASIVE CV LAB;  Service: Cardiovascular;  Laterality: N/A;   CHOLECYSTECTOMY   2010   COLONOSCOPY WITH PROPOFOL  N/A 06/30/2021    Procedure: COLONOSCOPY WITH PROPOFOL ;  Surgeon: Jinny Carmine, MD;  Location: [facility] ENDOSCOPY;  Service: Endoscopy;  Laterality: N/A;   CONTROL OF EPISTAXIS   02/04/2024   ECMO CANNULATION N/A 02/09/2024    Procedure: ECMO CANNULATION;  Surgeon: Zenaida Morene PARAS, MD;  Location: [facility] INVASIVE CV LAB;  Service: Cardiovascular;  Laterality: N/A;   INTRAOPERATIVE TRANSESOPHAGEAL ECHOCARDIOGRAM N/A 01/10/2024    Procedure: ECHOCARDIOGRAM, TRANSESOPHAGEAL, INTRAOPERATIVE;  Surgeon: Kerrin Elspeth BROCKS, MD;  Location: [facility] OR;  Service: Open Heart Surgery;  Laterality: N/A;   INTRAOPERATIVE TRANSESOPHAGEAL ECHOCARDIOGRAM N/A 02/08/2024    Procedure: ECHOCARDIOGRAM, TRANSESOPHAGEAL, INTRAOPERATIVE;  Surgeon: Kerrin Elspeth BROCKS, MD;  Location: [facility] OR;  Service: Open Heart Surgery;  Laterality: N/A;   IR TUNNELED CENTRAL VENOUS CATH PLC W IMG   02/25/2024   LAPAROSCOPIC TOTAL HYSTERECTOMY   2006   MITRAL VALVE REPAIR N/A 01/10/2024    Procedure: REPAIR, MITRAL VALVE;  Surgeon: Kerrin Elspeth BROCKS, MD;  Location: [facility] OR;  Service: Open Heart Surgery;  Laterality: N/A;   MITRAL VALVE REPLACEMENT N/A 02/08/2024    Procedure: REPLACEMENT, MITRAL VALVE WITH MITRIS RESILIA MITRAL VALVE 27mm;  Surgeon: Kerrin Elspeth BROCKS, MD;  Location: [facility] OR;  Service: Open Heart Surgery;  Laterality: N/A;   REDO STERNOTOMY N/A 02/08/2024    Procedure: REDO STERNOTOMY;  Surgeon: Kerrin Elspeth BROCKS, MD;  Location: [facility] OR;  Service: Open Heart Surgery;  Laterality: N/A;   RIGHT HEART CATH N/A 02/05/2024    Procedure: RIGHT HEART CATH;  Surgeon: Zenaida Morene PARAS, MD;  Location: [facility] INVASIVE CV LAB;  Service: Cardiovascular;  Laterality: N/A;   RIGHT/LEFT HEART CATH AND CORONARY ANGIOGRAPHY N/A 01/04/2024    Procedure: RIGHT/LEFT HEART CATH AND CORONARY ANGIOGRAPHY;  Surgeon: Verlin Lonni BIRCH, MD;  Location: [facility] INVASIVE CV LAB;  Service: Cardiovascular;  Laterality: N/A;   TOOTH EXTRACTION N/A 01/07/2024    Procedure: DENTAL RESTORATION/EXTRACTIONS;  Surgeon: Sheryle Hamilton, DMD;  Location: [facility] OR;  Service: Oral Surgery;  Laterality: N/A;  EXTRACTION OF TEETH #2, #6, #7, #16, #29   TRANSESOPHAGEAL ECHOCARDIOGRAM (CATH LAB) N/A 01/01/2024    Procedure: TRANSESOPHAGEAL ECHOCARDIOGRAM;  Surgeon: Lonni Slain, MD;  Location: [facility] INVASIVE CV LAB;  Service: Cardiovascular;  Laterality: N/A;   TRANSESOPHAGEAL ECHOCARDIOGRAM (CATH LAB) N/A 01/22/2024    Procedure: TRANSESOPHAGEAL ECHOCARDIOGRAM;  Surgeon: Rolan Ezra RAMAN, MD;  Location: [facility] INVASIVE CV LAB;  Service: Cardiovascular;  Laterality: N/A;  Assessment & Plan Clinical Impression: Patient is a 69 y.o. year old female with history of thyroid  dx, anxiety, nephrolithiasis s/p lithotripsy w/ ureteral stent at [facility] 12/14/23 which were removed on 10/14 as patient going OOT to attend wedding in [facility]. While in [place], [name], she developed pyelopheritis with candida bacteremia due to UTI treated with IV fluconazole  at [facility]. She signed out  AMA to return for treatment closer to home and presented to [facility] 12/29/23.  Dr. Fleeta Rothman consulted  for input on persistent candemiaa as repeat [facility] 10/20 still positive for candida albicans and recommended change to miafungin, repeat TTE as well as ophthalmology consult. Opth consult by Dr. Mike Fleeta was negative for fugal endopthalmitis. TEE showed very small MV vegetation and Dr. Kerrin recommended assessment of coronaries as well as dental evaluation prior to surgery.   She underwent cardiac cath by Dr. Verlin 10/31 showed no evidence of CAD and normal left/right heart pressures.  She underwent  extraction of multiple non-restorable teeth due to dental caries by Dr. Sheryle and underwent repair of mitral valve on 01/10/24 with post cardiotomy shock with A fib managed with amiodarone  gtt as well as fevers and treated with broad spectrum antibiotics due to concerns of aspiration PNA. Heart failure team consulted to manage overload and required Milrinone , Epi and Norepi.     She developed  epistaxis in setting of ASA and Eliquis  on 11/17 requiring d/c of ASA but had recurrent episode 12/01 treated nasal endoscopy and packing by Dr. Penne Croak.   She continued to have worsening of heart failure with inability to wean pressors, found to have severe mitral regurgitation and was taken back to OR on 02/08/24 for Redo median sternotomy with MVR w/Mitris Resilia tissue valve. Post op RV failure required ECMO support 12/06- 12/08. She continued to have worsening of renal status. Dr. Geralynn felt that AKI due due to contrast nephropathy with fluid overload despite high doses of IV diuretics and patient started on  CRRT 12/13.  She tolerated extubation by 12/14 and transitioned to HD. She failed attempts at intermittent HD with IV diuretic challenge.  TDC placed on 12/22 and has been transitioned to HD MWF.    Po intake has been poor in part due to dry mouth and intermittent issues with nausea and continues on  tube feeds with additional oral nutritional supplements. She has declined PEG tube and family reported that at baseline she ate like a bird.  Megace  added to stimulate appetite and tube feeds being weaned off--currently at 35 cc/hr at nights.  WOC following for input on stage 3 buttocks DTI as well as rash/dermatitis.     Pt presents with decreased activity tolerance, decreased functional mobility, decreased balance, feelings of stress Limiting pt's independence with leisure/community pursuits.  Met with pt & daughter today to discuss TR services including leisure education, activity analysis/modifications and stress management.  Also discussed the importance of social, emotional, spiritual health in addition to physical health and their effects on overall health and wellness.  Pt stated understanding.   Plan  Min 1 TR session >20 minutes during LOS  Recommendations for other services: Neuropsych  Discharge Criteria: Patient will be discharged from TR if patient refuses treatment 3 consecutive times without medical reason.  If treatment goals not met, if there is a change in medical status, if patient makes no progress towards goals or if patient is discharged from hospital.  The above assessment, treatment plan, treatment alternatives and goals were discussed and mutually  agreed upon: by patient  Ivis Henneman 03/13/2024, 4:07 PM

## 2024-03-14 ENCOUNTER — Inpatient Hospital Stay (HOSPITAL_COMMUNITY)

## 2024-03-14 DIAGNOSIS — R5381 Other malaise: Secondary | ICD-10-CM | POA: Diagnosis not present

## 2024-03-14 LAB — BASIC METABOLIC PANEL WITH GFR
Anion gap: 18 — ABNORMAL HIGH (ref 5–15)
BUN: 104 mg/dL — ABNORMAL HIGH (ref 8–23)
CO2: 21 mmol/L — ABNORMAL LOW (ref 22–32)
Calcium: 9.7 mg/dL (ref 8.9–10.3)
Chloride: 93 mmol/L — ABNORMAL LOW (ref 98–111)
Creatinine, Ser: 3.9 mg/dL — ABNORMAL HIGH (ref 0.44–1.00)
GFR, Estimated: 12 mL/min — ABNORMAL LOW
Glucose, Bld: 114 mg/dL — ABNORMAL HIGH (ref 70–99)
Potassium: 4.7 mmol/L (ref 3.5–5.1)
Sodium: 132 mmol/L — ABNORMAL LOW (ref 135–145)

## 2024-03-14 LAB — CBC WITH DIFFERENTIAL/PLATELET
Abs Immature Granulocytes: 0.06 K/uL (ref 0.00–0.07)
Basophils Absolute: 0 K/uL (ref 0.0–0.1)
Basophils Relative: 0 %
Eosinophils Absolute: 0.1 K/uL (ref 0.0–0.5)
Eosinophils Relative: 1 %
HCT: 36.5 % (ref 36.0–46.0)
Hemoglobin: 12.3 g/dL (ref 12.0–15.0)
Immature Granulocytes: 1 %
Lymphocytes Relative: 11 %
Lymphs Abs: 1.1 K/uL (ref 0.7–4.0)
MCH: 32 pg (ref 26.0–34.0)
MCHC: 33.7 g/dL (ref 30.0–36.0)
MCV: 95.1 fL (ref 80.0–100.0)
Monocytes Absolute: 0.6 K/uL (ref 0.1–1.0)
Monocytes Relative: 6 %
Neutro Abs: 8 K/uL — ABNORMAL HIGH (ref 1.7–7.7)
Neutrophils Relative %: 81 %
Platelets: 163 K/uL (ref 150–400)
RBC: 3.84 MIL/uL — ABNORMAL LOW (ref 3.87–5.11)
RDW: 17.4 % — ABNORMAL HIGH (ref 11.5–15.5)
WBC: 10 K/uL (ref 4.0–10.5)
nRBC: 0 % (ref 0.0–0.2)

## 2024-03-14 LAB — URINALYSIS, ROUTINE W REFLEX MICROSCOPIC
Bilirubin Urine: NEGATIVE
Glucose, UA: NEGATIVE mg/dL
Ketones, ur: NEGATIVE mg/dL
Nitrite: NEGATIVE
Protein, ur: 100 mg/dL — AB
Specific Gravity, Urine: 1.016 (ref 1.005–1.030)
Squamous Epithelial / HPF: >50 /HPF (ref 0–5)
WBC, UA: >50 WBC/hpf (ref 0–5)
pH: 5 (ref 5.0–8.0)

## 2024-03-14 LAB — GLUCOSE, CAPILLARY
Glucose-Capillary: 130 mg/dL — ABNORMAL HIGH (ref 70–99)
Glucose-Capillary: 173 mg/dL — ABNORMAL HIGH (ref 70–99)
Glucose-Capillary: 88 mg/dL (ref 70–99)

## 2024-03-14 LAB — MAGNESIUM: Magnesium: 2.4 mg/dL (ref 1.7–2.4)

## 2024-03-14 MED ORDER — LIDOCAINE HCL URETHRAL/MUCOSAL 2 % EX GEL: CUTANEOUS | Status: DC | PRN

## 2024-03-14 MED ORDER — MELATONIN 3 MG PO TABS
3.0000 mg | ORAL_TABLET | Freq: Every day | ORAL | Status: DC
  Administered 2024-03-14 – 2024-03-21 (×8): 3 mg via ORAL
  Filled 2024-03-14 (×8): qty 1

## 2024-03-14 MED ORDER — HEPARIN SODIUM (PORCINE) 1000 UNIT/ML IJ SOLN
4200.0000 [IU] | Freq: Once | INTRAMUSCULAR | Status: AC
  Administered 2024-03-14: 4200 [IU]
  Filled 2024-03-14: qty 4.2

## 2024-03-14 NOTE — Progress Notes (Signed)
 Orthopedic Tech Progress Note Patient Details:  Tracy Baker 1955-11-06 [id number]  Ortho Devices Type of Ortho Device: ASO Ortho Device/Splint Location: RLE Ortho Device/Splint Interventions: Ordered, Application, Adjustment   Post Interventions Patient Tolerated: Well Instructions Provided: Adjustment of device, Care of device  [name] 03/14/2024, 11:27 AM

## 2024-03-14 NOTE — Progress Notes (Signed)
 Received patient in bed to unit.  Alert and oriented.  Informed consent signed and in chart.   TX duration:3 hours and 45 minutes  Patient tolerated well.  Transported back to the room  Alert, without acute distress.  Hand-off given to patient's nurse.   Access used: Left Chest HD cath Access issues: none  Total UF removed: 0mL Medication(s) given: 100 cc bolus   03/14/24 1815  Vitals  Temp 98 F (36.7 C)  Temp Source Oral  BP 97/65  MAP (mmHg) 75  Pulse Rate 83  ECG Heart Rate 84  Resp 14  Weight 67.6 kg  Type of Weight Post-Dialysis  Oxygen Therapy  SpO2 100 %  O2 Device Room Air  During Treatment Monitoring  Blood Flow Rate (mL/min) 399 mL/min  Arterial Pressure (mmHg) -229.68 mmHg  Venous Pressure (mmHg) 218.78 mmHg  TMP (mmHg) -5.25 mmHg  Ultrafiltration Rate (mL/min) 303 mL/min  Dialysate Flow Rate (mL/min) 300 ml/min  Duration of HD Treatment -hour(s) 3.75 hour(s)  Cumulative Fluid Removed (mL) per Treatment  -115.68  HD Safety Checks Performed Yes  Intra-Hemodialysis Comments Tx completed  Post Treatment  Dialyzer Clearance Lightly streaked  Hemodialysis Intake (mL) 100 mL  Liters Processed 90  Fluid Removed (mL) 0 mL  Tolerated HD Treatment Yes  Hemodialysis Catheter Left Internal jugular Double lumen Permanent (Tunneled)  Placement Date/Time: 02/25/24 1348   Serial / Lot #: [id number]  Expiration Date: 08/03/28  Time Out: Correct patient;Correct site;Correct procedure  Maximum sterile barrier precautions: Hand hygiene;Cap;Mask;Sterile gown;Sterile gloves;Large sterile s...  Site Condition No complications  Blue Lumen Status Flushed;Antimicrobial dead end cap;Heparin  locked  Red Lumen Status Flushed;Antimicrobial dead end cap;Heparin  locked  Purple Lumen Status N/A  Catheter fill solution Heparin  1000 units/ml  Catheter fill volume (Arterial) 2.1 cc  Catheter fill volume (Venous) 2.1  Dressing Type Transparent  Dressing Status Antimicrobial  disc/dressing in place;Clean, Dry, Intact  Drainage Description None  Dressing Change Due 03/18/24  Post treatment catheter status Capped and Clamped     Camellia Brasil LPN [facility] D[facility]

## 2024-03-14 NOTE — Progress Notes (Signed)
 Patient ID: Tracy Baker, female   DOB: Nov 03, 1955, 69 y.o.   MRN: [id number] Met with the patient to review current medical situation, rehab process, team conference and plan of care. Discussed medications and dietary modifications; cortrak out and increased protein intake with AKI/renal restrictions on HD. Reviewed eating on HD and CAD. Discussed neuropathy/foot pain management on current regimen. Able to sleep with prevalon boots on. Also noted soreness to sacral area/wound but declined change of mattress; currently on Envy G series/gel pressure redistribution mattress.  Continue to follow along to address educational needs to facilitate preparation for discharge. Tracy Baker

## 2024-03-14 NOTE — Progress Notes (Signed)
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 TDC. Next HD Friday have increased rx time d/t significant azotemia between rx Following UOP and labs and intradialytic window for any suggestive GFR recovery, none to date Currently using Left IJ TDC placed on 12/22 with IR.   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 On antifungal therapy  Anemia -transfuse PRN for Hgb <7; Hb 10s  Hyponatremia: mild and intermittent.  Manage volume with dialysis, na/fluid restriction  Will follow.    Subjective:    Seen in bed.  Looks exhausted after therapies.  For HD today    Objective:   BP 110/69 (BP Location: Left Arm)   Pulse 76   Temp 97.7 F (36.5 C) (Oral)   Resp 18   Ht 5' 4 (1.626 m)   Wt 67 kg   SpO2 100%   BMI 25.35 kg/m   Intake/Output Summary (Last 24 hours) at 03/14/2024 1335 Last data filed at 03/14/2024 1256 Gross per 24 hour  Intake 537 ml  Output --  Net 537 ml    Weight change: -1.947 kg  Physical Exam: Gen: tired appearing, NAD CVS: normal rate, no rub Resp: bl chest rise, normal wob Abd: soft Ext: No significant edema noted Dialysis access:IJ tunneled  HD cath  Imaging: No results found.      Labs: BMET Recent Labs  Lab 03/08/24 0500 03/09/24 0435 03/10/24 0658 03/12/24 1142 03/12/24 1530 03/13/24 1027  NA 132* 133* 133* 127* 128* 130*  K 4.1 4.2 4.3 4.9 4.8 4.5  CL 91* 92* 93* 87* 89* 91*  CO2 25 23 23  21* 20* 23  GLUCOSE 185* 162* 166* 146* 118* 137*  BUN 71* 108* 139* 121* 123* 72*  CREATININE 2.80* 3.51* 3.68* 3.94* 3.94* 2.68*  CALCIUM  9.6 9.7 9.6 10.0 9.8 10.0  PHOS 5.7* 6.6* 6.5*  --  5.7*  --    CBC Recent Labs  Lab 03/12/24 1142 03/12/24 1530 03/13/24 1027 03/14/24 1224  WBC 10.3 9.5 10.9* 10.0   NEUTROABS 8.3*  --  8.6* 8.0*  HGB 12.0 10.3* 12.4 12.3  HCT 35.7* 31.0* 37.2 36.5  MCV 95.2 98.4 95.6 95.1  PLT 158 134* 193 163    Medications:     amiodarone   200 mg Oral Daily   apixaban   5 mg Oral BID   cholecalciferol   1,000 Units Oral Daily   fluconazole   400 mg Oral Q supper   Followed by   NOREEN ON 05/05/2024] fluconazole   200 mg Oral Daily   Gerhardt's butt cream   Topical BID   hydrocerin   Topical BID   insulin  aspart  0-9 Units Subcutaneous TID AC & HS   kidney failure book   Does not apply Once   megestrol   80 mg Oral BID   melatonin  3 mg Oral QHS   mirtazapine   30 mg Oral QHS   mouth rinse  15 mL Mouth Rinse 4 times per day   pantoprazole   40 mg Oral Daily   pregabalin   25 mg Oral QHS   sertraline   25 mg Oral QHS       "

## 2024-03-14 NOTE — Progress Notes (Signed)
 Occupational Therapy Session Note  Patient Details  Name: Tracy Baker MRN: [id number] Date of Birth: January 21, 1956  Today's Date: 03/14/2024 OT Individual Time: 9049-8954 OT Individual Time Calculation (min): 55 min    Short Term Goals: Week 1:  OT Short Term Goal 1 (Week 1): Pt will complete sit > stand with CGA using LRAD OT Short Term Goal 2 (Week 1): Pt will complete toilet transfer with min A using LRAD OT Short Term Goal 3 (Week 1): Pt will complete 2/3 toileting steps with CGA for balance OT Short Term Goal 4 (Week 1): Pt will adhere to sternal precautions 100% for 2 consecutive sessions with supervision  Skilled Therapeutic Interventions/Progress Updates:  Skilled OT intervention completed with focus on ADL retraining, hemodynamic stability/education. Pt received supine in bed, agreeable to session. R ankle pain reported with standing; MD made aware of continued pain. OT offered rest breaks and repositioning throughout for pain reduction.  To establish rapport, OT allowed pt to make choices including which side of bed, and went at her pace (really slow). Monitored BP during session (see below) due to hypotension yesterday. Pt requested R side of bed as this is familiar to her at home. Required mod/max cueing for all sequencing and adherence to sternal precautions but able to roll > R with supervision using BLE, R side > sitting EOB with heavy min A for trunk elevation. Pt c/o lightheadedness but slightly improved with prolonged sitting and application of abdominal binder- MD notified of need for larger binder due to tight fit and continued orthostatic hypotension. Pt required distractive techniques with conversation to remain sitting as pt did request several times to return to bed due to fatigue. Agreeable to transfer to w/c. Completed min A sit > stand with RW and stand pivot to w/c with cues needed to backwards step to w/c. R ankle pain reported with weight bearing in RLE, MD aware.  Pt  with need to void. Transported > bathroom. Completed min A sit > stand and stand pivot > BSC over toilet with RW. Pt with continent void but only a few drops- pt reports fullness in bladder but no burning- MD and RN made aware. Mod A for toileting steps for pants only and min A to stand from [facility]. Min A stand pivot with RW > w/c.   Pt requested to return to bed but agreeable to remain in w/c for next PT session. Pt remained seated, with belt alarm on/activated, and with all needs in reach at end of session.  Vitals BP 102/64 (supine), HR 80 bpm BP 88/63 (EOB) BP 101/59 (EOB with binder and several mins) BP 92/62 (after transfer to w/c)   Therapy Documentation Precautions:  Precautions Precautions: Fall, Sternal Recall of Precautions/Restrictions: Impaired Restrictions Weight Bearing Restrictions Per Provider Order: No Other Position/Activity Restrictions: Cardiac sternal precautions    Therapy/Group: Individual Therapy  [name], MS, OTR/L  03/14/2024, 11:14 AM

## 2024-03-14 NOTE — Plan of Care (Signed)
" °  Problem: RH BOWEL ELIMINATION Goal: RH STG MANAGE BOWEL WITH ASSISTANCE Description: STG Manage Bowel with mod I  Assistance. Outcome: Progressing Goal: RH STG MANAGE BOWEL W/MEDICATION W/ASSISTANCE Description: STG Manage Bowel with Medication with mod I Assistance. Outcome: Progressing   Problem: RH SKIN INTEGRITY Goal: RH STG MAINTAIN SKIN INTEGRITY WITH ASSISTANCE Description: STG Maintain Skin Integrity With Assistance. Outcome: Progressing   Problem: RH SAFETY Goal: RH STG ADHERE TO SAFETY PRECAUTIONS W/ASSISTANCE/DEVICE Description: STG Adhere to Safety Precautions With cues Assistance/Device. Outcome: Progressing   Problem: RH PAIN MANAGEMENT Goal: RH STG PAIN MANAGED AT OR BELOW PT'S PAIN GOAL Description: Pain < 4 with prns Outcome: Progressing   "

## 2024-03-14 NOTE — Progress Notes (Addendum)
 "                                                        PROGRESS NOTE   Subjective/Complaints: Asked how she slept last night since we stopped the seroqul and clonazepam - she did not sleep as well, discussed starting melatonin instead  ROS: improved appetite, +insomnia   Objective:   No results found. Recent Labs    03/12/24 1530 03/13/24 1027  WBC 9.5 10.9*  HGB 10.3* 12.4  HCT 31.0* 37.2  PLT 134* 193   Recent Labs    03/12/24 1530 03/13/24 1027  NA 128* 130*  K 4.8 4.5  CL 89* 91*  CO2 20* 23  GLUCOSE 118* 137*  BUN 123* 72*  CREATININE 3.94* 2.68*  CALCIUM  9.8 10.0    Intake/Output Summary (Last 24 hours) at 03/14/2024 0945 Last data filed at 03/13/2024 1906 Gross per 24 hour  Intake 297 ml  Output --  Net 297 ml     Wound 03/10/24 2051 Pressure Injury Sacrum Right Stage 2 -  Partial thickness loss of dermis presenting as a shallow open injury with a red, pink wound bed without slough. (Active)    Physical Exam: Vital Signs Blood pressure (!) 100/59, pulse 82, temperature 98.2 F (36.8 C), resp. rate 18, height 5' 4 (1.626 m), weight 67 kg, SpO2 96%. Gen: no distress, normal appearing HEENT: oral mucosa pink and moist, NCAT, +NGT Cardio: Reg rate Chest: normal effort, normal rate of breathing Abd: soft, non-distended Ext: no edema Psych: pleasant, normal affect Skin: has chest wall incision, tunneled catheter on left chest wall, right buttock wound and bilateral heel wounds- c/d/i Neuro: Alert,4-/5 strength throughout, good attention, sensation is intact, stable 1/9   Assessment/Plan: 1. Functional deficits which require 3+ hours per day of interdisciplinary therapy in a comprehensive inpatient rehab setting. Physiatrist is providing close team supervision and 24 hour management of active medical problems listed below. Physiatrist and rehab team continue to assess barriers to discharge/monitor patient progress toward functional and medical  goals  Care Tool:  Bathing    Body parts bathed by patient: Right arm, Left arm, Chest, Abdomen, Front perineal area, Face   Body parts bathed by helper: Buttocks, Right upper leg, Left upper leg, Right lower leg, Left lower leg     Bathing assist Assist Level: Moderate Assistance - Patient 50 - 74%     Upper Body Dressing/Undressing Upper body dressing   What is the patient wearing?: Pull over shirt    Upper body assist Assist Level: Maximal Assistance - Patient 25 - 49%    Lower Body Dressing/Undressing Lower body dressing      What is the patient wearing?: Incontinence brief, Pants     Lower body assist Assist for lower body dressing: Total Assistance - Patient < 25%     Toileting Toileting    Toileting assist Assist for toileting: Total Assistance - Patient < 25%     Transfers Chair/bed transfer  Transfers assist  Chair/bed transfer activity did not occur: Safety/medical concerns (pain, fatigue, dizziness)  Chair/bed transfer assist level: Minimal Assistance - Patient > 75%     Locomotion Ambulation   Ambulation assist   Ambulation activity did not occur: Safety/medical concerns (pain, fatigue, dizziness)  Assist level: Minimal Assistance - Patient > 75% Assistive device:  Walker-rolling Max distance: 83ft   Walk 10 feet activity   Assist  Walk 10 feet activity did not occur: Safety/medical concerns (pain, fatigue, dizziness)        Walk 50 feet activity   Assist Walk 50 feet with 2 turns activity did not occur: Safety/medical concerns (pain, fatigue, dizziness)         Walk 150 feet activity   Assist Walk 150 feet activity did not occur: Safety/medical concerns (pain, fatigue, dizziness)         Walk 10 feet on uneven surface  activity   Assist Walk 10 feet on uneven surfaces activity did not occur: Safety/medical concerns (pain, fatigue, dizziness)         Wheelchair     Assist Is the patient using a wheelchair?:  Yes Type of Wheelchair: Manual Wheelchair activity did not occur: Safety/medical concerns (pain, fatigue, dizziness)         Wheelchair 50 feet with 2 turns activity    Assist    Wheelchair 50 feet with 2 turns activity did not occur: Safety/medical concerns (pain, fatigue, dizziness)       Wheelchair 150 feet activity     Assist  Wheelchair 150 feet activity did not occur: Safety/medical concerns (pain, fatigue, dizziness)       Blood pressure (!) 100/59, pulse 82, temperature 98.2 F (36.8 C), resp. rate 18, height 5' 4 (1.626 m), weight 67 kg, SpO2 96%.  Medical Problem List and Plan: 1. Functional deficits secondary to debility after prolonged hospital stay related to mitral valve vegetations and MV repair and ultimate replacement.             -patient may  shower             -ELOS/Goals: 15 days, supervision goals with PT, OT, SLP  Continue CIR Continue Eliquis  Full code but no chest compressions  2. Hypotension: medications reviewed and is on amiodarone , d/c clonazepam , continue compression garments  3. Pain: A) Bilateral heel pain: Lyrica  25mg  scheduled HS B) Right ankle pain: ASO ordered, placed nursing for heat application in the morning for stiffness and ice in the afternoon to reduce inflammation. F/u XR results   4. Insomnia: continue Remeron , scheduled melatonin        5. Neuropsych/cognition: This patient is capable of making decisions on her own behalf.  6. Skin/Wound Care: Air mattress overlay for pressure relief.             --continue Ensure and prosource             --add Vitamin C  and Zinc to promote wound healing.   7. Decreased appetite: discussed with patient that she feels this is improving, will d/c cortrak, discussed that family is bringing foods from home NO FR needed per  nephrology.    -transition to oral diet as able. Pt feels that appetite is improving.  -pt is on low dose megace   8.Fungal endocarditis: continue IV diflucan   with EOT 03/21/23.             --F/u with ID 01/06?-->discussed with Dr. Overton who will follow up with for input.   9. 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   10. AKI with dialysis initiated 02/16/24: On HD MWF at the end of the day to help with tolerance of therapy             Cr reviewed and is improving             --  CLIP on hold. Needs to be able to sit up in a chair for HD.   11. A fib: Monitor HR TID--continue amiodarone  and eliquis .   12. Anemia of critical illness:Being monitored and stable.              --thrombocytopenia has resolved.   13. Urinary urgency: UA ordered 1/9, PVRs ordered, f/u UA  14. Daytime fatigue: D3 and B12 ordered (one time injection given red dye allergy), d/c seroquel , change lyrica  to HS, decreased therapy to 15/7, d/c clonazepam  at night, discussed that remeron  could contribute but will maintain for now given insomnia and to help stimulate appetite, melatonin scheduled at night to help with insomnia  15. Hyponatremia: Na level reviewed and this improved, continue to monitor Na levels with HD  LOS: 4 days A FACE TO FACE EVALUATION WAS PERFORMED  Peggie Hornak P Ainsley Deakins 03/14/2024, 9:45 AM     "

## 2024-03-14 NOTE — Progress Notes (Signed)
 Physical Therapy Session Note  Patient Details  Name: Tracy Baker MRN: [id number] Date of Birth: [birth year]  Today's Date: 03/14/2024 PT Individual Time: [phone] and 1115-1140 PT Individual Time Calculation (min): 68 min and 25 min Today's Date: 03/14/2024 PT Missed Time: 7 Minutes and 20 minutes Missed Time Reason: Patient fatigue and x-ray  Short Term Goals: Week 1:  PT Short Term Goal 1 (Week 1): pt will perform bed mobility with min A consistantly PT Short Term Goal 2 (Week 1): pt will perform bed<>chair transfers with LRAD and CGA PT Short Term Goal 3 (Week 1): pt will ambulate 23ft with LRAD and min A  Skilled Therapeutic Interventions/Progress Updates:   Treatment Session 1 Received pt semi-reclined in bed, pt agreeable to PT treatment, and denied any pain but limited by fatigue and dizziness. Session with emphasis on OOB tolerance, functional mobility/transfers, toileting, generalized strengthening and endurance, dynamic standing balance/coordination, and ambulation. Pt transferred semi-reclined<>sitting R EOB with HOB elevated and light mod A for trunk control with cues to adhere to sternal precautions. Scooted to EOB with CGA (again cues to avoid pushing). Performed stand<>pivot into WC with RW and min A, then reported dizziness - BP: 80/57. RN arrived to administer medications. Pt reported urge to toilet and transferred to/from toilet with bedside commode over top using grab bar with min A. Pt required mod A for clothing management and able to void. Sat in WC at sink and performed hand hygiene, washed face, and brushed teeth with setup assist. BP: 90/59.    Donned knee high teds dependently with ++ time due to sensitivity in L heel while MD present for morning rounds. Reassessed BP: 85/42. Pt transported to/from room in [facility] dependently for time management purposes. Stood with RW and min A and ambulated 63ft with RW and min A (+2 for WC follow) - limited by fatigue. Pt reported urge to  toilet and returned to room. Performed stand<>pivot on/off toilet with min A using grab bar and required assist for clothing management. Pt able to void again but still unable to have BM. Pt requested to return to bed despite encouragement to remain in [facility] for next OT session. Performed stand<>pivot WC<>bed with RW and min A and transitioned into supine with min A. Pt able to bridge hips minimally to reposition in midline. Floated heels off pillow and left semi-reclined in bed with all needs within reach. Pt reporting 9/10 fatigue at end of session. 7 minutes missed of skilled physical therapy due to fatigue.   Treatment Session 2 Received pt semi-reclined in bed, pt reluctantly agreed to PT treatment with encouragement, and reported pain 5/10 in R ankle (RN notified and present to administer pain medication). Session with emphasis on OOB tolerance, functional mobility/transfers, generalized strengthening and endurance, and ambulation. Ortho tech present to deliver R ASO and donned dependently. With encouragement, pt agreed to attempt ambulation in room. Transferred semi-reclined<>sitting L EOB with HOB elevated and heavy min A with improved adherence to sternal precautions. Pt reporting increased dizziness - BP: 141/66 and pt reported improvements in symptoms. Stood from EOB with RW and min A and took 3 steps forward, then 3 steps back with RW and CGA/min A. Pt reported pain in R ankle was better with ASO but unable to tolerate any more therapy due to fatigue and requesting to lie back down. Removed abdominal binder and transitioned into supine with CGA. X-ray arrived for imaging of R ankle - scooted to [facility] with +2 assist and removed ASO dependently.  Pt left supine in bed with x-ray technician at bedside. 25 minutes missed of skilled physical therapy due to x-ray.   Therapy Documentation Precautions:  Precautions Precautions: Fall, Sternal Recall of Precautions/Restrictions:  Impaired Precaution/Restrictions Comments: cortak Restrictions Weight Bearing Restrictions Per Provider Order: No Other Position/Activity Restrictions: Cardiac sternal precautions  Therapy/Group: Individual Therapy Tracy Baker Tracy Baker PT, DPT 03/14/2024, 6:48 AM

## 2024-03-14 NOTE — Progress Notes (Signed)
 Calorie Count Note  48 hour calorie count ordered. Per calorie count results, pt meeting between 40-54% calorie and 31-45% protein needs with PO intake. Intake varies and tends to be inconsistent each day. Pt does not like most oral nutrition supplements which has been barrier to pt's meeting nutrition needs for some time. Continue to encourage family bringing in outside food and discussed high kcal, high protein tips for intake. Pt's intake has definitely improved since Cortrak removal, hope improvement will continue. Will continue to monitor pt's PO intake and pt's ability to meaningfully participate in therapy sessions to make progress.  Diet: regular  Supplements: does not like ONS  Estimated Nutritional Needs:  Kcal:  1900-2100 Protein:  85-105g   Day 1: 1/7 Dinner: 80% potato soup, fruit/yogurt, pudding (417 kcal, 14.5 g protein) 1/8 Breakfast: 50% frosted flakes, whole milk, juice, fruit (173 kcal, 5 g protein) 1/8 Lunch: 75% pot roast, gravy, mashed potatoes w/ gravy, green beans, banana pudding (441 kcal, 19 g protein)  Total intake: 1031 kcal (54% of minimum estimated needs)  38.5 protein (45% of minimum estimated needs)  Day 2: 1/8 Dinner: 60% turkey sandwich, juice x 2, magic cup (479 kcal, 17 g protein) 1/9 Breakfast: 25% frosted flakes, whole milk, juice, fruit (87 kcal, 2.5 g protein) 1/9 Lunch: 25% turkey sandwich, juice x 2, magic cup (200 kcal, 7 g protein)   Total intake: 766 kcal (40% of minimum estimated needs)  26.5 protein (31% of minimum estimated needs)  INTERVENTION:  Encouraged improvements in PO intake now that tube is removed and throat pain should improve Can add snacks BID at pt request (pt currently does not want snacks)   Encouraged family to bring in outside food, smoothies, shakes to help pt's intake If family brings in strawberry syrup, pt can request vanilla Ensure shake from floor stock to drink but otherwise does not want them ordered as part  of regimen   Added Magic cup TID with meals, each supplement provides 290 kcal and 9 grams of protein   Pt refuses most ONS (RD team has previously tried Ensure, [employer], [employer], [employer] and [employer])   NUTRITION DIAGNOSIS:    Severe Malnutrition related to chronic illness as evidenced by moderate muscle depletion, severe muscle depletion, moderate fat depletion.  Remains applicable  GOAL:    Patient will meet greater than or equal to 90% of their needs Progressing but still unmet with PO intake    Josette Glance, MS, RDN, LDN Clinical Dietitian I Please reach out via secure chat

## 2024-03-14 NOTE — Progress Notes (Signed)
 Pt in IR, will plan to meet Monday to discuss out-pt HD.   Tracy Baker Dialysis Navigator [phone]

## 2024-03-14 NOTE — Plan of Care (Signed)
" °  Problem: Consults Goal: RH GENERAL PATIENT EDUCATION Description: See Patient Education module for education specifics. Outcome: Progressing Goal: Skin Care Protocol Initiated - if Braden Score 18 or less Description: If consults are not indicated, leave blank or document N/A Outcome: Progressing Goal: Nutrition Consult-if indicated Outcome: Progressing Goal: Diabetes Guidelines if Diabetic/Glucose > 140 Description: If diabetic or lab glucose is > 140 mg/dl - Initiate Diabetes/Hyperglycemia Guidelines & Document Interventions  Outcome: Progressing   Problem: RH BOWEL ELIMINATION Goal: RH STG MANAGE BOWEL WITH ASSISTANCE Description: STG Manage Bowel with mod I  Assistance. Outcome: Progressing Goal: RH STG MANAGE BOWEL W/MEDICATION W/ASSISTANCE Description: STG Manage Bowel with Medication with mod I Assistance. Outcome: Progressing   Problem: RH SKIN INTEGRITY Goal: RH STG SKIN FREE OF INFECTION/BREAKDOWN Description: Manage skin w min assist Outcome: Progressing Goal: RH STG MAINTAIN SKIN INTEGRITY WITH ASSISTANCE Description: STG Maintain Skin Integrity With Assistance. Outcome: Progressing Goal: RH STG ABLE TO PERFORM INCISION/WOUND CARE W/ASSISTANCE Description: STG Able To Perform Incision/Wound Care With min Assistance. Outcome: Progressing   Problem: RH SAFETY Goal: RH STG ADHERE TO SAFETY PRECAUTIONS W/ASSISTANCE/DEVICE Description: STG Adhere to Safety Precautions With cues Assistance/Device. Outcome: Progressing   Problem: RH PAIN MANAGEMENT Goal: RH STG PAIN MANAGED AT OR BELOW PT'S PAIN GOAL Description: Pain < 4 with prns Outcome: Progressing   Problem: RH KNOWLEDGE DEFICIT GENERAL Goal: RH STG INCREASE KNOWLEDGE OF SELF CARE AFTER HOSPITALIZATION Description: Patient and family will be able to manage care at discharge using educational resources for medication, skin care and dietary modification independently Outcome: Progressing   "

## 2024-03-15 DIAGNOSIS — R5383 Other fatigue: Secondary | ICD-10-CM | POA: Diagnosis not present

## 2024-03-15 DIAGNOSIS — A499 Bacterial infection, unspecified: Secondary | ICD-10-CM | POA: Diagnosis not present

## 2024-03-15 DIAGNOSIS — N390 Urinary tract infection, site not specified: Secondary | ICD-10-CM | POA: Diagnosis not present

## 2024-03-15 DIAGNOSIS — R630 Anorexia: Secondary | ICD-10-CM

## 2024-03-15 DIAGNOSIS — R5381 Other malaise: Secondary | ICD-10-CM | POA: Diagnosis not present

## 2024-03-15 DIAGNOSIS — N179 Acute kidney failure, unspecified: Secondary | ICD-10-CM | POA: Diagnosis not present

## 2024-03-15 LAB — BASIC METABOLIC PANEL WITH GFR
Anion gap: 14 (ref 5–15)
BUN: 35 mg/dL — ABNORMAL HIGH (ref 8–23)
CO2: 26 mmol/L (ref 22–32)
Calcium: 9.5 mg/dL (ref 8.9–10.3)
Chloride: 92 mmol/L — ABNORMAL LOW (ref 98–111)
Creatinine, Ser: 2.24 mg/dL — ABNORMAL HIGH (ref 0.44–1.00)
GFR, Estimated: 23 mL/min — ABNORMAL LOW
Glucose, Bld: 105 mg/dL — ABNORMAL HIGH (ref 70–99)
Potassium: 4.1 mmol/L (ref 3.5–5.1)
Sodium: 131 mmol/L — ABNORMAL LOW (ref 135–145)

## 2024-03-15 LAB — OCCULT BLOOD X 1 CARD TO LAB, STOOL: Fecal Occult Bld: POSITIVE — AB

## 2024-03-15 LAB — GLUCOSE, CAPILLARY
Glucose-Capillary: 108 mg/dL — ABNORMAL HIGH (ref 70–99)
Glucose-Capillary: 144 mg/dL — ABNORMAL HIGH (ref 70–99)
Glucose-Capillary: 201 mg/dL — ABNORMAL HIGH (ref 70–99)
Glucose-Capillary: 106 mg/dL — ABNORMAL HIGH (ref 70–99)

## 2024-03-15 LAB — MAGNESIUM: Magnesium: 2 mg/dL (ref 1.7–2.4)

## 2024-03-15 MED ORDER — GLUCERNA SHAKE PO LIQD
237.0000 mL | Freq: Three times a day (TID) | ORAL | Status: DC
  Administered 2024-03-15 – 2024-03-18 (×6): 237 mL via ORAL

## 2024-03-15 MED ORDER — MIDODRINE HCL 5 MG PO TABS
10.0000 mg | ORAL_TABLET | Freq: Three times a day (TID) | ORAL | Status: DC
  Administered 2024-03-15 – 2024-03-28 (×38): 10 mg via ORAL
  Filled 2024-03-15 (×38): qty 2

## 2024-03-15 MED ORDER — VITAMIN C 500 MG PO TABS
500.0000 mg | ORAL_TABLET | Freq: Every day | ORAL | Status: DC
  Administered 2024-03-15 – 2024-04-02 (×16): 500 mg via ORAL
  Filled 2024-03-15 (×15): qty 1

## 2024-03-15 NOTE — Progress Notes (Signed)
 Stool sample collected and sent to lab.  FORBES Armor, LPN

## 2024-03-15 NOTE — Progress Notes (Signed)
 "                                                        PROGRESS NOTE   Subjective/Complaints: Pt had a reasonable night. Progressing with rehab. Appetite better  ROS: Patient denies fever, rash, sore throat, blurred vision, dizziness, nausea, vomiting, diarrhea, cough, shortness of breath or chest pain, joint or back/neck pain, headache, or mood change.    Objective:   DG Ankle 2 Views Right Result Date: 03/14/2024 CLINICAL DATA:  Right ankle pain. EXAM: RIGHT ANKLE - 2 VIEW COMPARISON:  None Available. FINDINGS: Artifact from overlying sock. There is no evidence of fracture, dislocation, or joint effusion. The ankle mortise is preserved. There is no evidence of arthropathy or other focal bone abnormality. Soft tissues are unremarkable. IMPRESSION: Negative radiographs of the right ankle. Electronically Signed   By: Andrea Gasman M.D.   On: 03/14/2024 16:10   Recent Labs    03/13/24 1027 03/14/24 1224  WBC 10.9* 10.0  HGB 12.4 12.3  HCT 37.2 36.5  PLT 193 163   Recent Labs    03/14/24 1400 03/15/24 0601  NA 132* 131*  K 4.7 4.1  CL 93* 92*  CO2 21* 26  GLUCOSE 114* 105*  BUN 104* 35*  CREATININE 3.90* 2.24*  CALCIUM  9.7 9.5    Intake/Output Summary (Last 24 hours) at 03/15/2024 0817 Last data filed at 03/15/2024 0600 Gross per 24 hour  Intake 635 ml  Output 0 ml  Net 635 ml     Wound 03/10/24 2051 Pressure Injury Sacrum Right Stage 2 -  Partial thickness loss of dermis presenting as a shallow open injury with a red, pink wound bed without slough. (Active)    Physical Exam: Vital Signs Blood pressure (!) 85/51, pulse 78, temperature 98.3 F (36.8 C), temperature source Oral, resp. rate 17, height 5' 4 (1.626 m), weight 67.7 kg, SpO2 98%. Constitutional: No distress . Vital signs reviewed. HEENT: NCAT, EOMI, oral membranes moist Neck: supple Cardiovascular: RRR without murmur. No JVD    Respiratory/Chest: CTA Bilaterally without wheezes or rales. Normal effort     GI/Abdomen: BS +, non-tender, non-distended Ext: no clubbing, cyanosis, or edema Psych: a bit flat but cooperative  Skin: has chest wall incision, tunneled catheter on left chest wall, right buttock wound and bilateral heel wounds- c/d/i Neuro: Alert,4-/5 strength throughout, good attention, sensation is intact, stable 1/10   Assessment/Plan: 1. Functional deficits which require 3+ hours per day of interdisciplinary therapy in a comprehensive inpatient rehab setting. Physiatrist is providing close team supervision and 24 hour management of active medical problems listed below. Physiatrist and rehab team continue to assess barriers to discharge/monitor patient progress toward functional and medical goals  Care Tool:  Bathing    Body parts bathed by patient: Right arm, Left arm, Chest, Abdomen, Front perineal area, Face   Body parts bathed by helper: Buttocks, Right upper leg, Left upper leg, Right lower leg, Left lower leg     Bathing assist Assist Level: Moderate Assistance - Patient 50 - 74%     Upper Body Dressing/Undressing Upper body dressing   What is the patient wearing?: Pull over shirt    Upper body assist Assist Level: Maximal Assistance - Patient 25 - 49%    Lower Body Dressing/Undressing Lower body dressing  What is the patient wearing?: Incontinence brief, Pants     Lower body assist Assist for lower body dressing: Total Assistance - Patient < 25%     Toileting Toileting    Toileting assist Assist for toileting: Moderate Assistance - Patient 50 - 74%     Transfers Chair/bed transfer  Transfers assist  Chair/bed transfer activity did not occur: Safety/medical concerns (pain, fatigue, dizziness)  Chair/bed transfer assist level: Minimal Assistance - Patient > 75%     Locomotion Ambulation   Ambulation assist   Ambulation activity did not occur: Safety/medical concerns (pain, fatigue, dizziness)  Assist level: Minimal Assistance -  Patient > 75% Assistive device: Walker-rolling Max distance: 73ft   Walk 10 feet activity   Assist  Walk 10 feet activity did not occur: Safety/medical concerns (pain, fatigue, dizziness)        Walk 50 feet activity   Assist Walk 50 feet with 2 turns activity did not occur: Safety/medical concerns (pain, fatigue, dizziness)         Walk 150 feet activity   Assist Walk 150 feet activity did not occur: Safety/medical concerns (pain, fatigue, dizziness)         Walk 10 feet on uneven surface  activity   Assist Walk 10 feet on uneven surfaces activity did not occur: Safety/medical concerns (pain, fatigue, dizziness)         Wheelchair     Assist Is the patient using a wheelchair?: Yes Type of Wheelchair: Manual Wheelchair activity did not occur: Safety/medical concerns (pain, fatigue, dizziness)         Wheelchair 50 feet with 2 turns activity    Assist    Wheelchair 50 feet with 2 turns activity did not occur: Safety/medical concerns (pain, fatigue, dizziness)       Wheelchair 150 feet activity     Assist  Wheelchair 150 feet activity did not occur: Safety/medical concerns (pain, fatigue, dizziness)       Blood pressure (!) 85/51, pulse 78, temperature 98.3 F (36.8 C), temperature source Oral, resp. rate 17, height 5' 4 (1.626 m), weight 67.7 kg, SpO2 98%.  Medical Problem List and Plan: 1. Functional deficits secondary to debility after prolonged hospital stay related to mitral valve vegetations and MV repair and ultimate replacement.             -patient may  shower             -ELOS/Goals: 15 days, supervision goals with PT, OT, SLP  -Continue CIR therapies including PT, OT, and SLP  Continue Eliquis  Full code but no chest compressions  2. Hypotension: medications reviewed and is on amiodarone , d/c clonazepam , continue compression garments  3. Pain: A) Bilateral heel pain: Lyrica  25mg  scheduled HS B) Right ankle pain: ASO  ordered, placed nursing for heat application in the morning for stiffness and ice in the afternoon to reduce inflammation.  1/10 F/u XR reviewed and negative. Pt did not c/o pain today   4. Insomnia: continue Remeron , scheduled melatonin        5. Neuropsych/cognition: This patient is capable of making decisions on her own behalf.  6. Skin/Wound Care: Air mattress overlay for pressure relief.             --continue Ensure and prosource             --added Vitamin C  and Zinc to promote wound healing.   7. Decreased appetite: discussed with patient that she feels this is improving, will d/c cortrak,  discussed that family is bringing foods from home NO FR needed per  nephrology.    -appetite/intake fair to poor.  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             --F/u with ID 01/06?-->discussed with Dr. Overton who will follow up with for input.   9. 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   10. AKI with dialysis initiated 02/16/24: On HD MWF at the end of the day to help with tolerance of therapy             Cr reviewed and is improving             --CLIP on hold. Needs to be able to sit up in a chair for HD.   11. A fib: Monitor HR TID--continue amiodarone  and eliquis .   12. Anemia of critical illness:Being monitored and stable.              --thrombocytopenia has resolved.   13. Urinary urgency: UA ordered 1/9, PVRs ordered, f/u UA  14. Daytime fatigue: D3 and B12 ordered (one time injection given red dye allergy), d/c seroquel , change lyrica  to HS, decreased therapy to 15/7, d/c clonazepam  at night, discussed that remeron  could contribute but will maintain for now given insomnia and to help stimulate appetite, melatonin scheduled at night to help with insomnia  -1/10 pt was fairly alert this am. Obsv over weekend 15. Hyponatremia: Na level reviewed and this improved, continue to monitor Na levels with  HD  LOS: 5 days A FACE TO FACE EVALUATION WAS PERFORMED  Arthea ONEIDA Gunther 03/15/2024, 8:17 AM     "

## 2024-03-15 NOTE — Progress Notes (Addendum)
 PA aware of bilateral heels;pink and blanchable. Orders in place; foam dressings/prevalon boots in place.   E. Kit

## 2024-03-15 NOTE — Progress Notes (Signed)
 Noted new are to left posterior foot; pictures and measurements obtained; MD notified new orders placed. Education reinforced at bedside.  E. Kit, LPN

## 2024-03-15 NOTE — Progress Notes (Signed)
 Notified to assess and stage purple area on left heel. Appears to be DTI, new photographs and measurements taken, MD made aware, WOC order obtained. Discussed with patient importance of prevalon boots for prevention.

## 2024-03-15 NOTE — Plan of Care (Signed)
" °  Problem: Consults Goal: RH GENERAL PATIENT EDUCATION Description: See Patient Education module for education specifics. Outcome: Progressing Goal: Skin Care Protocol Initiated - if Braden Score 18 or less Description: If consults are not indicated, leave blank or document N/A Outcome: Progressing Goal: Nutrition Consult-if indicated Outcome: Progressing Goal: Diabetes Guidelines if Diabetic/Glucose > 140 Description: If diabetic or lab glucose is > 140 mg/dl - Initiate Diabetes/Hyperglycemia Guidelines & Document Interventions  Outcome: Progressing   Problem: RH BOWEL ELIMINATION Goal: RH STG MANAGE BOWEL WITH ASSISTANCE Description: STG Manage Bowel with mod I  Assistance. Outcome: Progressing Goal: RH STG MANAGE BOWEL W/MEDICATION W/ASSISTANCE Description: STG Manage Bowel with Medication with mod I Assistance. Outcome: Progressing   Problem: RH SKIN INTEGRITY Goal: RH STG SKIN FREE OF INFECTION/BREAKDOWN Description: Manage skin w min assist Outcome: Progressing Goal: RH STG MAINTAIN SKIN INTEGRITY WITH ASSISTANCE Description: STG Maintain Skin Integrity With Assistance. Outcome: Progressing Goal: RH STG ABLE TO PERFORM INCISION/WOUND CARE W/ASSISTANCE Description: STG Able To Perform Incision/Wound Care With min Assistance. Outcome: Progressing   Problem: RH SAFETY Goal: RH STG ADHERE TO SAFETY PRECAUTIONS W/ASSISTANCE/DEVICE Description: STG Adhere to Safety Precautions With cues Assistance/Device. Outcome: Progressing   Problem: RH PAIN MANAGEMENT Goal: RH STG PAIN MANAGED AT OR BELOW PT'S PAIN GOAL Description: Pain < 4 with prns Outcome: Progressing   Problem: RH KNOWLEDGE DEFICIT GENERAL Goal: RH STG INCREASE KNOWLEDGE OF SELF CARE AFTER HOSPITALIZATION Description: Patient and family will be able to manage care at discharge using educational resources for medication, skin care and dietary modification independently Outcome: Progressing   "

## 2024-03-15 NOTE — Progress Notes (Signed)
 Physical Therapy Session Note  Patient Details  Name: Tracy Baker MRN: [id number] Date of Birth: Nov 21, 1955  Today's Date: 03/15/2024 PT Individual Time: [phone] and [id number] PT Individual Time Calculation (min): 41 min and 23 min PT Missed Time: 22 minutes PT Missed Time Reason: Fatigue  Short Term Goals: Week 1:  PT Short Term Goal 1 (Week 1): pt will perform bed mobility with min A consistantly PT Short Term Goal 2 (Week 1): pt will perform bed<>chair transfers with LRAD and CGA PT Short Term Goal 3 (Week 1): pt will ambulate 45ft with LRAD and min A  Skilled Therapeutic Interventions/Progress Updates:   Treatment Session 1 Received pt semi-reclined in bed asleep. Upon wakening, pt reluctantly agreed to PT treatment with encouragement and denied any pain but reported fatigue. Session with emphasis on OOB tolerance, functional mobility/transfers, generalized strengthening and endurance, dynamic standing balance/coordination, toileting, and ambulation. Pt transferred semi-reclined<>sitting L EOB with HOB elevated and mod A for trunk control with cues to avoid pulling on bedrails (pt required significantly ++ time with bed mobility due to vertigo). Scooted to EOB (again cues to avoid pushing with BUE), then reported feeling shaky and returned to supine with CGA. Took a few bites of banana and sips of water , then encouraged pt to try again - returned to sitting L EOB with min A for trunk control and ate a few bites of cereal.   Challenged pt with ambulating to door - with maximal encouragement, pt ambulated 62ft with RW and min A to door (min A to stand with RW from EOB), then immediately sat down due to dizziness/fatigue - BP: 82/53. Pt reported urge to toilet - transferred to/from toilet using grab bar with min A and required mod A for clothing management. Pt able to void but no BM. Performed hygiene management without assist and donned clean brief with max A. Pt sat in WC at sink and  washed face with setup assist. With maximal encouragement, pt agreed to tolerate sitting in WC to finish eating breakfast but then reported urge to toilet again. Transferred onto toilet using grab bar and min A and left in care of NT due to time restrictions.   Treatment Session 2 Received pt semi-reclined in bed eating lunch - encouraged getting into WC to continue eating but pt declined. With maximal encouragement pt agreed to get OOB to attempt short walk. Pt transferred semi-reclined<>sitting L EOB with HOB elevated and min A for trunk control. Pt reported increased dizziness from vertigo that improved slightly. Stood with RW and CGA and ambulated 76ft with RW and CGA (1 turn). Returned to bed and requested to lie down due to fatigue - transitioned into supine with supervision and scooted to [facility] pushing with BLE and using Trendelenburg bed position. Concluded session with pt semi-reclined in bed, needs within reach, and bed alarm on. 22 minutes missed of skilled physical therapy due to fatigue.    Therapy Documentation Precautions:  Precautions Precautions: Fall, Sternal Recall of Precautions/Restrictions: Impaired Precaution/Restrictions Comments: cortak Restrictions Weight Bearing Restrictions Per Provider Order: No Other Position/Activity Restrictions: Cardiac sternal precautions  Therapy/Group: Individual Therapy [name] [name] PT, DPT 03/15/2024, 6:37 AM

## 2024-03-15 NOTE — Progress Notes (Signed)
 " [facility] Progress Note    Assessment/ Plan:   Dialysis dependent AKI, initiated 02/16/2024: Creatinine normal at baseline Currently on MWF schedule: 3.5h, no heparin . TDC. Next HD Friday have increased rx time d/t significant azotemia between rx Following UOP and labs and intradialytic window for any suggestive GFR recovery, none to date Currently using Left IJ TDC placed on 12/22 with IR.   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 On antifungal therapy  Anemia -transfuse PRN for Hgb <7; Hb 10s  Hyponatremia: mild and intermittent.  Manage volume with dialysis, na/fluid restriction  Will follow.    Subjective:    Tolerated HD yesterday, no fluid removed.  Still orthostatic/ dizzy after sitting up.  Will add midodrine .  She was struggling with her lunch tray- helped her get it into place.    Objective:   BP (!) 85/51 (BP Location: Left Arm)   Pulse 78   Temp 98.3 F (36.8 C) (Oral)   Resp 17   Ht 5' 4 (1.626 m)   Wt 67.7 kg   SpO2 98%   BMI 25.62 kg/m   Intake/Output Summary (Last 24 hours) at 03/15/2024 1338 Last data filed at 03/15/2024 0857 Gross per 24 hour  Intake 615 ml  Output 0 ml  Net 615 ml    Weight change: -0.049 kg  Physical Exam: Gen: tired appearing, NAD CVS: normal rate, no rub Resp: bl chest rise, normal wob Abd: soft Ext: No significant edema noted Dialysis access:IJ tunneled  HD cath  Imaging: DG Ankle 2 Views Right Result Date: 03/14/2024 CLINICAL DATA:  Right ankle pain. EXAM: RIGHT ANKLE - 2 VIEW COMPARISON:  None Available. FINDINGS: Artifact from overlying sock. There is no evidence of fracture, dislocation, or joint effusion. The ankle mortise is preserved. There is no evidence of arthropathy or other focal bone abnormality. Soft tissues are unremarkable. IMPRESSION: Negative radiographs of the right ankle.  Electronically Signed   By: Andrea Gasman M.D.   On: 03/14/2024 16:10        Labs: BMET Recent Labs  Lab 03/09/24 0435 03/10/24 0658 03/12/24 1142 03/12/24 1530 03/13/24 1027 03/14/24 1400 03/15/24 0601  NA 133* 133* 127* 128* 130* 132* 131*  K 4.2 4.3 4.9 4.8 4.5 4.7 4.1  CL 92* 93* 87* 89* 91* 93* 92*  CO2 23 23 21* 20* 23 21* 26  GLUCOSE 162* 166* 146* 118* 137* 114* 105*  BUN 108* 139* 121* 123* 72* 104* 35*  CREATININE 3.51* 3.68* 3.94* 3.94* 2.68* 3.90* 2.24*  CALCIUM  9.7 9.6 10.0 9.8 10.0 9.7 9.5  PHOS 6.6* 6.5*  --  5.7*  --   --   --    CBC Recent Labs  Lab 03/12/24 1142 03/12/24 1530 03/13/24 1027 03/14/24 1224  WBC 10.3 9.5 10.9* 10.0  NEUTROABS 8.3*  --  8.6* 8.0*  HGB 12.0 10.3* 12.4 12.3  HCT 35.7* 31.0* 37.2 36.5  MCV 95.2 98.4 95.6 95.1  PLT 158 134* 193 163    Medications:     amiodarone   200 mg Oral Daily   apixaban   5 mg Oral BID   cholecalciferol   1,000 Units Oral Daily   fluconazole   400 mg Oral Q supper   Followed by   NOREEN ON 05/05/2024] fluconazole   200 mg Oral Daily   Gerhardt's butt cream   Topical BID   hydrocerin  Topical BID   insulin  aspart  0-9 Units Subcutaneous TID AC & HS   kidney failure book   Does not apply Once   megestrol   80 mg Oral BID   melatonin  3 mg Oral QHS   midodrine   10 mg Oral TID WC   mirtazapine   30 mg Oral QHS   mouth rinse  15 mL Mouth Rinse 4 times per day   pantoprazole   40 mg Oral Daily   pregabalin   25 mg Oral QHS   sertraline   25 mg Oral QHS       "

## 2024-03-15 NOTE — Progress Notes (Signed)
 Occupational Therapy Session Note  Patient Details  Name: Paolina Karwowski MRN: [id number] Date of Birth: 02/19/1956  Today's Date: 03/15/2024 OT Missed Time: 45 Minutes Missed Time Reason: Patient fatigue;Patient unwilling/refused to participate without medical reason  Patient received in room in bed. Patient said I'm too tired not today. I may have it in me later with OT attempt for skilled therapy. OT will return later as time allows.    D'mariea L Tiffanni Scarfo 03/15/2024, 8:38 AM

## 2024-03-15 NOTE — Plan of Care (Signed)
" °  Problem: RH SKIN INTEGRITY Goal: RH STG ABLE TO PERFORM INCISION/WOUND CARE W/ASSISTANCE Description: STG Able To Perform Incision/Wound Care With min Assistance. Outcome: Progressing   Problem: RH SAFETY Goal: RH STG ADHERE TO SAFETY PRECAUTIONS W/ASSISTANCE/DEVICE Description: STG Adhere to Safety Precautions With cues Assistance/Device. Outcome: Progressing   Problem: RH PAIN MANAGEMENT Goal: RH STG PAIN MANAGED AT OR BELOW PT'S PAIN GOAL Description: Pain < 4 with prns Outcome: Progressing   "

## 2024-03-16 DIAGNOSIS — R5383 Other fatigue: Secondary | ICD-10-CM | POA: Diagnosis not present

## 2024-03-16 DIAGNOSIS — N179 Acute kidney failure, unspecified: Secondary | ICD-10-CM | POA: Diagnosis not present

## 2024-03-16 DIAGNOSIS — R630 Anorexia: Secondary | ICD-10-CM | POA: Diagnosis not present

## 2024-03-16 DIAGNOSIS — N390 Urinary tract infection, site not specified: Secondary | ICD-10-CM

## 2024-03-16 DIAGNOSIS — R5381 Other malaise: Secondary | ICD-10-CM | POA: Diagnosis not present

## 2024-03-16 DIAGNOSIS — A499 Bacterial infection, unspecified: Secondary | ICD-10-CM

## 2024-03-16 LAB — GLUCOSE, CAPILLARY
Glucose-Capillary: 128 mg/dL — ABNORMAL HIGH (ref 70–99)
Glucose-Capillary: 124 mg/dL — ABNORMAL HIGH (ref 70–99)
Glucose-Capillary: 104 mg/dL — ABNORMAL HIGH (ref 70–99)
Glucose-Capillary: 136 mg/dL — ABNORMAL HIGH (ref 70–99)

## 2024-03-16 LAB — MAGNESIUM: Magnesium: 2.2 mg/dL (ref 1.7–2.4)

## 2024-03-16 MED ORDER — SULFAMETHOXAZOLE-TRIMETHOPRIM 400-80 MG PO TABS
1.0000 | ORAL_TABLET | Freq: Two times a day (BID) | ORAL | Status: DC
  Administered 2024-03-16 – 2024-03-18 (×5): 1 via ORAL
  Filled 2024-03-16 (×6): qty 1

## 2024-03-16 NOTE — Progress Notes (Signed)
 Urine specimen sent to lab per MD order.   E. Kit, LPN

## 2024-03-16 NOTE — Progress Notes (Addendum)
 " Tracy Baker Progress Note    Assessment/ Plan:   Dialysis dependent AKI, initiated 02/16/2024: Creatinine normal at baseline Currently on MWF schedule: 3.75h, no heparin . TDC. Have increased rx time d/t significant azotemia between rx Following UOP and labs and intradialytic window for any suggestive GFR recovery, none to date Currently using Left IJ TDC placed on 12/22 with IR.   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 On antifungal therapy  Anemia -transfuse PRN for Hgb <7; Hb 10s  Hyponatremia: mild and intermittent.  Manage volume with dialysis, na/fluid restriction  Will follow.    Subjective:    Addition of midodrine  helped her feel less dizzy    Objective:   BP 107/65 (BP Location: Left Arm)   Pulse 72   Temp 98.2 F (36.8 C) (Oral)   Resp 19   Ht 5' 4 (1.626 m)   Wt 69.8 kg   SpO2 100%   BMI 26.40 kg/m   Intake/Output Summary (Last 24 hours) at 03/16/2024 1131 Last data filed at 03/16/2024 0820 Gross per 24 hour  Intake 580 ml  Output --  Net 580 ml    Weight change: 2.812 kg  Physical Exam: Gen: tired appearing, NAD CVS: normal rate, no rub Resp: bl chest rise, normal wob Abd: soft Ext: No significant edema noted Dialysis access:IJ tunneled  HD cath  Imaging: DG Ankle 2 Views Right Result Date: 03/14/2024 CLINICAL DATA:  Right ankle pain. EXAM: RIGHT ANKLE - 2 VIEW COMPARISON:  None Available. FINDINGS: Artifact from overlying sock. There is no evidence of fracture, dislocation, or joint effusion. The ankle mortise is preserved. There is no evidence of arthropathy or other focal bone abnormality. Soft tissues are unremarkable. IMPRESSION: Negative radiographs of the right ankle. Electronically Signed   By: Andrea Gasman M.D.   On: 03/14/2024 16:10        Labs: BMET Recent  Labs  Lab 03/10/24 0658 03/12/24 1142 03/12/24 1530 03/13/24 1027 03/14/24 1400 03/15/24 0601  NA 133* 127* 128* 130* 132* 131*  K 4.3 4.9 4.8 4.5 4.7 4.1  CL 93* 87* 89* 91* 93* 92*  CO2 23 21* 20* 23 21* 26  GLUCOSE 166* 146* 118* 137* 114* 105*  BUN 139* 121* 123* 72* 104* 35*  CREATININE 3.68* 3.94* 3.94* 2.68* 3.90* 2.24*  CALCIUM  9.6 10.0 9.8 10.0 9.7 9.5  PHOS 6.5*  --  5.7*  --   --   --    CBC Recent Labs  Lab 03/12/24 1142 03/12/24 1530 03/13/24 1027 03/14/24 1224  WBC 10.3 9.5 10.9* 10.0  NEUTROABS 8.3*  --  8.6* 8.0*  HGB 12.0 10.3* 12.4 12.3  HCT 35.7* 31.0* 37.2 36.5  MCV 95.2 98.4 95.6 95.1  PLT 158 134* 193 163    Medications:     amiodarone   200 mg Oral Daily   apixaban   5 mg Oral BID   ascorbic acid   500 mg Oral Q supper   cholecalciferol   1,000 Units Oral Daily   feeding supplement (GLUCERNA SHAKE)  237 mL Oral TID BM   fluconazole   400 mg Oral Q supper   Followed by   NOREEN ON 05/05/2024] fluconazole   200 mg Oral Daily   Gerhardt's butt cream   Topical BID   hydrocerin   Topical BID   insulin  aspart  0-9 Units Subcutaneous TID AC & HS   kidney failure book   Does not apply Once   megestrol   80 mg Oral BID   melatonin  3 mg Oral QHS   midodrine   10 mg Oral TID WC   mirtazapine   30 mg Oral QHS   mouth rinse  15 mL Mouth Rinse 4 times per day   pantoprazole   40 mg Oral Daily   pregabalin   25 mg Oral QHS   sertraline   25 mg Oral QHS   sulfamethoxazole -trimethoprim   1 tablet Oral Q12H       "

## 2024-03-16 NOTE — Plan of Care (Signed)
" °  Problem: Consults Goal: RH GENERAL PATIENT EDUCATION Description: See Patient Education module for education specifics. Outcome: Progressing Goal: Skin Care Protocol Initiated - if Braden Score 18 or less Description: If consults are not indicated, leave blank or document N/A Outcome: Progressing Goal: Nutrition Consult-if indicated Outcome: Progressing Goal: Diabetes Guidelines if Diabetic/Glucose > 140 Description: If diabetic or lab glucose is > 140 mg/dl - Initiate Diabetes/Hyperglycemia Guidelines & Document Interventions  Outcome: Progressing   Problem: RH BOWEL ELIMINATION Goal: RH STG MANAGE BOWEL WITH ASSISTANCE Description: STG Manage Bowel with mod I  Assistance. Outcome: Progressing Goal: RH STG MANAGE BOWEL W/MEDICATION W/ASSISTANCE Description: STG Manage Bowel with Medication with mod I Assistance. Outcome: Progressing   Problem: RH SKIN INTEGRITY Goal: RH STG SKIN FREE OF INFECTION/BREAKDOWN Description: Manage skin w min assist Outcome: Progressing Goal: RH STG MAINTAIN SKIN INTEGRITY WITH ASSISTANCE Description: STG Maintain Skin Integrity With Assistance. Outcome: Progressing Goal: RH STG ABLE TO PERFORM INCISION/WOUND CARE W/ASSISTANCE Description: STG Able To Perform Incision/Wound Care With min Assistance. Outcome: Progressing   Problem: RH SAFETY Goal: RH STG ADHERE TO SAFETY PRECAUTIONS W/ASSISTANCE/DEVICE Description: STG Adhere to Safety Precautions With cues Assistance/Device. Outcome: Progressing   Problem: RH PAIN MANAGEMENT Goal: RH STG PAIN MANAGED AT OR BELOW PT'S PAIN GOAL Description: Pain < 4 with prns Outcome: Progressing   Problem: RH KNOWLEDGE DEFICIT GENERAL Goal: RH STG INCREASE KNOWLEDGE OF SELF CARE AFTER HOSPITALIZATION Description: Patient and family will be able to manage care at discharge using educational resources for medication, skin care and dietary modification independently Outcome: Progressing   "

## 2024-03-16 NOTE — Progress Notes (Signed)
 Physical Therapy Session Note  Patient Details  Name: Tracy Baker MRN: [id number] Date of Birth: 03/28/1955  Today's Date: 03/16/2024 PT Individual Time: 1300-1340  PT Individual Time Calculation (min): 40 min  Short Term Goals: Week 1:  PT Short Term Goal 1 (Week 1): pt will perform bed mobility with min A consistantly PT Short Term Goal 2 (Week 1): pt will perform bed<>chair transfers with LRAD and CGA PT Short Term Goal 3 (Week 1): pt will ambulate 29ft with LRAD and min A  Skilled Therapeutic Interventions/Progress Updates:  Chart reviewed and pt agreeable to therapy. Pt received seated in recliner with 0/10 c/o pain. Session focused on bed mobility, functional transfers, balance, and activity tolerance to promote functional recovery for out of bed mobility. Pt initiated session with blocked practice of sit to stand for multiple rounds to practice sequencing and positioning for energy conservation. Pt educated on scooting forward in chair, tucking feet, and leaning forward to promote weight shift before power to stand. Pt completed sit to stands using modA progressing to minA with improved understanding of technique. Pt then completed SPT to toilet using minA + rails + S for pericare. Pt then completed sit to stand using minA and completed standing marches for 3x10. Pt then completed SPT to bed and return to supine using minA.  Session education emphasized sternal precautions. At end of session, pt was left semi-reclined in bed with alarm engaged, nurse call bell and all needs in reach.     Therapy Documentation Precautions:  Precautions Precautions: Fall, Sternal Recall of Precautions/Restrictions: Impaired Precaution/Restrictions Comments: cortak Restrictions Weight Bearing Restrictions Per Provider Order: No Other Position/Activity Restrictions: Cardiac sternal precautions General:      Therapy/Group: Individual Therapy   Deniese Oberry G Rether Rison 03/16/2024, 1:42 PM

## 2024-03-16 NOTE — Progress Notes (Addendum)
 "                                                        PROGRESS NOTE   Subjective/Complaints: Slept fairly well. Still feels fatigued but admits it's getting gradually better. Pressure wound found on left heel yesterday.   ROS: Patient denies fever, rash, sore throat, blurred vision, dizziness, nausea, vomiting, diarrhea, cough, shortness of breath or chest pain, joint or back/neck pain, headache, or mood change.    Objective:   DG Ankle 2 Views Right Result Date: 03/14/2024 CLINICAL DATA:  Right ankle pain. EXAM: RIGHT ANKLE - 2 VIEW COMPARISON:  None Available. FINDINGS: Artifact from overlying sock. There is no evidence of fracture, dislocation, or joint effusion. The ankle mortise is preserved. There is no evidence of arthropathy or other focal bone abnormality. Soft tissues are unremarkable. IMPRESSION: Negative radiographs of the right ankle. Electronically Signed   By: Andrea Gasman M.D.   On: 03/14/2024 16:10   Recent Labs    03/13/24 1027 03/14/24 1224  WBC 10.9* 10.0  HGB 12.4 12.3  HCT 37.2 36.5  PLT 193 163   Recent Labs    03/14/24 1400 03/15/24 0601  NA 132* 131*  K 4.7 4.1  CL 93* 92*  CO2 21* 26  GLUCOSE 114* 105*  BUN 104* 35*  CREATININE 3.90* 2.24*  CALCIUM  9.7 9.5    Intake/Output Summary (Last 24 hours) at 03/16/2024 0741 Last data filed at 03/15/2024 1820 Gross per 24 hour  Intake 680 ml  Output --  Net 680 ml     Wound 03/10/24 2051 Pressure Injury Sacrum Right Stage 2 -  Partial thickness loss of dermis presenting as a shallow open injury with a red, pink wound bed without slough. (Active)     Wound 03/15/24 1507 Pressure Injury Foot Left;Posterior Deep Tissue Pressure Injury - Purple or maroon localized area of discolored intact skin or blood-filled blister due to damage of underlying soft tissue from pressure and/or shear. (Active)    Physical Exam: Vital Signs Blood pressure 107/65, pulse 72, temperature 98.2 F (36.8 C), temperature  source Oral, resp. rate 19, height 5' 4 (1.626 m), weight 69.8 kg, SpO2 100%. Constitutional: No distress . Vital signs reviewed. HEENT: NCAT, EOMI, oral membranes moist Neck: supple Cardiovascular: RRR without murmur. No JVD    Respiratory/Chest: CTA Bilaterally without wheezes or rales. Normal effort    GI/Abdomen: BS +, non-tender, non-distended Ext: no clubbing, cyanosis, or edema Psych: pleasant and cooperative  Skin: has chest wall incision, tunneled catheter on left chest wall, right buttock wound and bilateral heel wounds--> left heel now with quarter sized stage II.  Neuro: Alert,4-/5 strength throughout, good attention, sensation is intact, stable 1/10   Assessment/Plan: 1. Functional deficits which require 3+ hours per day of interdisciplinary therapy in a comprehensive inpatient rehab setting. Physiatrist is providing close team supervision and 24 hour management of active medical problems listed below. Physiatrist and rehab team continue to assess barriers to discharge/monitor patient progress toward functional and medical goals  Care Tool:  Bathing    Body parts bathed by patient: Right arm, Left arm, Chest, Abdomen, Front perineal area, Face   Body parts bathed by helper: Buttocks, Right upper leg, Left upper leg, Right lower leg, Left lower leg     Bathing assist  Assist Level: Moderate Assistance - Patient 50 - 74%     Upper Body Dressing/Undressing Upper body dressing   What is the patient wearing?: Pull over shirt    Upper body assist Assist Level: Maximal Assistance - Patient 25 - 49%    Lower Body Dressing/Undressing Lower body dressing      What is the patient wearing?: Incontinence brief, Pants     Lower body assist Assist for lower body dressing: Total Assistance - Patient < 25%     Toileting Toileting    Toileting assist Assist for toileting: Moderate Assistance - Patient 50 - 74%     Transfers Chair/bed transfer  Transfers assist   Chair/bed transfer activity did not occur: Safety/medical concerns (pain, fatigue, dizziness)  Chair/bed transfer assist level: Minimal Assistance - Patient > 75%     Locomotion Ambulation   Ambulation assist   Ambulation activity did not occur: Safety/medical concerns (pain, fatigue, dizziness)  Assist level: Minimal Assistance - Patient > 75% Assistive device: Walker-rolling Max distance: 38ft   Walk 10 feet activity   Assist  Walk 10 feet activity did not occur: Safety/medical concerns (pain, fatigue, dizziness)        Walk 50 feet activity   Assist Walk 50 feet with 2 turns activity did not occur: Safety/medical concerns (pain, fatigue, dizziness)         Walk 150 feet activity   Assist Walk 150 feet activity did not occur: Safety/medical concerns (pain, fatigue, dizziness)         Walk 10 feet on uneven surface  activity   Assist Walk 10 feet on uneven surfaces activity did not occur: Safety/medical concerns (pain, fatigue, dizziness)         Wheelchair     Assist Is the patient using a wheelchair?: Yes Type of Wheelchair: Manual Wheelchair activity did not occur: Safety/medical concerns (pain, fatigue, dizziness)         Wheelchair 50 feet with 2 turns activity    Assist    Wheelchair 50 feet with 2 turns activity did not occur: Safety/medical concerns (pain, fatigue, dizziness)       Wheelchair 150 feet activity     Assist  Wheelchair 150 feet activity did not occur: Safety/medical concerns (pain, fatigue, dizziness)       Blood pressure 107/65, pulse 72, temperature 98.2 F (36.8 C), temperature source Oral, resp. rate 19, height 5' 4 (1.626 m), weight 69.8 kg, SpO2 100%.  Medical Problem List and Plan: 1. Functional deficits secondary to debility after prolonged hospital stay related to mitral valve vegetations and MV repair and ultimate replacement.             -patient may  shower             -ELOS/Goals: 15  days, supervision goals with PT, OT, SLP  -Continue CIR therapies including PT, OT, and SLP  Continue Eliquis  Full code but no chest compressions  2. Hypotension: medications reviewed and is on amiodarone , d/c clonazepam , continue compression garments  3. Pain: A) Bilateral heel pain: Lyrica  25mg  scheduled HS B) Right ankle pain: ASO ordered, placed nursing for heat application in the morning for stiffness and ice in the afternoon to reduce inflammation.  1/10 F/u XR reviewed and negative. Pt did not c/o pain today   4. Insomnia: continue Remeron , scheduled melatonin        5. Neuropsych/cognition: This patient is capable of making decisions on her own behalf.  6. Skin/Wound Care: Air mattress overlay  for pressure relief.             --continue Ensure and prosource             --added Vitamin C  and Zinc to promote wound healing.   1/11 left heel appears to be evolution of what was present on admit   -continue above, prevalons   -discussed with pt importance of pressure relief, nutrition 7. Decreased appetite: discussed with patient that she feels this is improving, will d/c cortrak, discussed that family is bringing foods from home NO FR needed per  nephrology.    -appetite/intake is picking up.  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             --F/u with ID 01/06?-->discussed with Dr. Overton who will follow up with for input.   9. 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   10. AKI with dialysis initiated 02/16/24: On HD MWF at the end of the day to help with tolerance of therapy             Cr reviewed and is improving             --CLIP on hold. Needs to be able to sit up in a chair for HD.   11. A fib: Monitor HR TID--continue amiodarone  and eliquis .   12. Anemia of critical illness:Being monitored and stable.              --thrombocytopenia has resolved.   13. Urinary urgency: UA ordered 1/9,  PVRs ordered, f/u UA  1/11 UA +, need UCX   -begin empiric bactrim  ss bid 14. Daytime fatigue: D3 and B12 ordered (one time injection given red dye allergy), d/c seroquel , change lyrica  to HS, decreased therapy to 15/7, d/c clonazepam  at night, discussed that remeron  could contribute but will maintain for now given insomnia and to help stimulate appetite, melatonin scheduled at night to help with insomnia  -1/11 this seems to be slowly improving.  -encourage better po intake -maximize sleep 15. Hyponatremia: Na level reviewed and this improved, continue to monitor Na levels with HD  LOS: 6 days A FACE TO FACE EVALUATION WAS PERFORMED  Tracy Baker 03/16/2024, 7:41 AM     "

## 2024-03-16 NOTE — Progress Notes (Signed)
 Occupational Therapy Session Note  Patient Details  Name: Tracy Baker MRN: [id number] Date of Birth: Jul 16, 1955  Today's Date: 03/16/2024 OT Individual Time: 1120-1200 OT Individual Time Calculation (min): 40 min    Short Term Goals: Week 1:  OT Short Term Goal 1 (Week 1): Pt will complete sit > stand with CGA using LRAD OT Short Term Goal 2 (Week 1): Pt will complete toilet transfer with min A using LRAD OT Short Term Goal 3 (Week 1): Pt will complete 2/3 toileting steps with CGA for balance OT Short Term Goal 4 (Week 1): Pt will adhere to sternal precautions 100% for 2 consecutive sessions with supervision  Skilled Therapeutic Interventions/Progress Updates:  Pt greeted resting in bed, no reports of pain. Increased encouragement required for participation in session. Pt receptive to dressing but declines desire to bathe. Pt comes to EOB with assistance from trunk elevation, continuous max verbal cues for sternal precautions although patient able to state 3/3. Pt dons UB garments with setup A and Max A for LB garments. Standing balance with up to CGA + unilateral support on RW. Stand-step from EOB>WC with CGA + RW. Sink-side grooming at setup A. Pt dependent for WC transport from [employer]. Pt instructed in standing balance/tolerance activity with use of BITs modality. Pt completes 2 x ~1 mins rounds of activity with up to Min A (no AD) for standing balance and extended rest-breaks in between. Sit<>stands in similar fashion. Pt remained sitting at nurses station as facilities management worked in her room. Immediate needs met.   Therapy Documentation Precautions:  Precautions Precautions: Fall, Sternal Recall of Precautions/Restrictions: Impaired Precaution/Restrictions Comments: cortak Restrictions Weight Bearing Restrictions Per Provider Order: No Other Position/Activity Restrictions: Cardiac sternal precautions   Therapy/Group: Individual Therapy  Nereida Habermann,  OTR/L, MSOT  03/16/2024, 5:40 AM

## 2024-03-16 NOTE — Progress Notes (Signed)
 Physical Therapy Session Note  Patient Details  Name: Tracy Baker MRN: [id number] Date of Birth: [birth year]  Today's Date: 03/16/2024 PT Individual Time: 0902-0947 PT Individual Time Calculation (min): 45 min   Short Term Goals: Week 1:  PT Short Term Goal 1 (Week 1): pt will perform bed mobility with min A consistantly PT Short Term Goal 2 (Week 1): pt will perform bed<>chair transfers with LRAD and CGA PT Short Term Goal 3 (Week 1): pt will ambulate 39ft with LRAD and min A  Skilled Therapeutic Interventions/Progress Updates:     Pt received supine in bed. Pt denies pain but is agreeable to PT tx after encouragement to participate. Pt states I thought I had the day off. PT reviewed schedule with pt.   Theract: BP supine 107/57. Pt stated she needed to go to the bathroom. Supine to sit to supine mod A with PT verbal cues to not use the handrail or her hands to push from the bed. Stand pivot to/from w/c CGA to min A. STS toilet mod A. Pt had posterior LOB pulling up undies on toilet requiring PT mod A to maintain standing.   Therex: QS x 15, heel slides x 15 with PT guiding heel to eliminate friction on bed; supine hip abdct x 15,ankle pumps x 15, SLR x 20, SAQ unilat and bil x 15; iso SAQ with ankle pump x 15.  Pt left supine in bed; all needs within reach, no complaints of pain.   Therapy Documentation Precautions:  Precautions Precautions: Fall, Sternal Recall of Precautions/Restrictions: Impaired Precaution/Restrictions Comments: cortak Restrictions Weight Bearing Restrictions Per Provider Order: No Other Position/Activity Restrictions: Cardiac sternal precautions  Therapy/Group: Individual Therapy  Alger Ada 03/16/2024, 7:18 AM

## 2024-03-17 DIAGNOSIS — R5381 Other malaise: Secondary | ICD-10-CM | POA: Diagnosis not present

## 2024-03-17 LAB — CBC WITH DIFFERENTIAL/PLATELET
Abs Immature Granulocytes: 0.06 K/uL (ref 0.00–0.07)
Basophils Absolute: 0.1 K/uL (ref 0.0–0.1)
Basophils Relative: 1 %
Eosinophils Absolute: 0.3 K/uL (ref 0.0–0.5)
Eosinophils Relative: 3 %
HCT: 30.7 % — ABNORMAL LOW (ref 36.0–46.0)
Hemoglobin: 10.3 g/dL — ABNORMAL LOW (ref 12.0–15.0)
Immature Granulocytes: 1 %
Lymphocytes Relative: 19 %
Lymphs Abs: 1.7 K/uL (ref 0.7–4.0)
MCH: 32.3 pg (ref 26.0–34.0)
MCHC: 33.6 g/dL (ref 30.0–36.0)
MCV: 96.2 fL (ref 80.0–100.0)
Monocytes Absolute: 0.7 K/uL (ref 0.1–1.0)
Monocytes Relative: 8 %
Neutro Abs: 6 K/uL (ref 1.7–7.7)
Neutrophils Relative %: 68 %
Platelets: 157 K/uL (ref 150–400)
RBC: 3.19 MIL/uL — ABNORMAL LOW (ref 3.87–5.11)
RDW: 17.3 % — ABNORMAL HIGH (ref 11.5–15.5)
WBC: 8.7 K/uL (ref 4.0–10.5)
nRBC: 0 % (ref 0.0–0.2)

## 2024-03-17 LAB — MAGNESIUM: Magnesium: 2.2 mg/dL (ref 1.7–2.4)

## 2024-03-17 LAB — GLUCOSE, CAPILLARY
Glucose-Capillary: 122 mg/dL — ABNORMAL HIGH (ref 70–99)
Glucose-Capillary: 120 mg/dL — ABNORMAL HIGH (ref 70–99)
Glucose-Capillary: 139 mg/dL — ABNORMAL HIGH (ref 70–99)

## 2024-03-17 MED ORDER — HEPARIN SODIUM (PORCINE) 1000 UNIT/ML IJ SOLN
4200.0000 [IU] | Freq: Once | INTRAMUSCULAR | Status: AC
  Administered 2024-03-17: 4200 [IU]

## 2024-03-17 MED ORDER — MIDODRINE HCL 5 MG PO TABS
ORAL_TABLET | ORAL | Status: AC
  Filled 2024-03-17: qty 2

## 2024-03-17 MED ORDER — HEPARIN SODIUM (PORCINE) 1000 UNIT/ML IJ SOLN
INTRAMUSCULAR | Status: AC
  Filled 2024-03-17: qty 5

## 2024-03-17 NOTE — Procedures (Signed)
 I was present at this dialysis session. I have reviewed the session and made appropriate changes.   3K bath, UF goal = No UF.  Tolerating well using TDC.  Hb 10.3  CTM.   Filed Weights   03/15/24 0424 03/16/24 0505 03/17/24 0548  Weight: 67.7 kg 69.8 kg 70 kg    Recent Labs  Lab 03/12/24 1530 03/13/24 1027 03/15/24 0601  NA 128*   < > 131*  K 4.8   < > 4.1  CL 89*   < > 92*  CO2 20*   < > 26  GLUCOSE 118*   < > 105*  BUN 123*   < > 35*  CREATININE 3.94*   < > 2.24*  CALCIUM  9.8   < > 9.5  PHOS 5.7*  --   --    < > = values in this interval not displayed.    Recent Labs  Lab 03/13/24 1027 03/14/24 1224 03/17/24 0622  WBC 10.9* 10.0 8.7  NEUTROABS 8.6* 8.0* 6.0  HGB 12.4 12.3 10.3*  HCT 37.2 36.5 30.7*  MCV 95.6 95.1 96.2  PLT 193 163 157    Scheduled Meds:  amiodarone   200 mg Oral Daily   apixaban   5 mg Oral BID   ascorbic acid   500 mg Oral Q supper   cholecalciferol   1,000 Units Oral Daily   feeding supplement (GLUCERNA SHAKE)  237 mL Oral TID BM   fluconazole   400 mg Oral Q supper   Followed by   NOREEN ON 05/05/2024] fluconazole   200 mg Oral Daily   Gerhardt's butt cream   Topical BID   hydrocerin   Topical BID   insulin  aspart  0-9 Units Subcutaneous TID AC & HS   megestrol   80 mg Oral BID   melatonin  3 mg Oral QHS   midodrine   10 mg Oral TID WC   mirtazapine   30 mg Oral QHS   mouth rinse  15 mL Mouth Rinse 4 times per day   pantoprazole   40 mg Oral Daily   pregabalin   25 mg Oral QHS   sertraline   25 mg Oral QHS   sulfamethoxazole -trimethoprim   1 tablet Oral Q12H   Continuous Infusions: PRN Meds:.acetaminophen , albuterol , aluminum  hydroxide, antiseptic oral rinse, dextromethorphan , lidocaine , magic mouthwash, milk and molasses, polyethylene glycol, promethazine  **OR** promethazine  **OR** promethazine  (PHENERGAN ) injection (IM or IVPB), simethicone , sorbitol    Bernardino Gasman  MD 03/17/2024, 3:25 PM

## 2024-03-17 NOTE — Progress Notes (Signed)
 Notified by IP rehab team that pt daughter requesting to be updated on dialysis information. Called daughter veronica who stated she was walking her dog. Requested I call back tomorrow.   Vergia Chea Dialysis Navigator [phone]

## 2024-03-17 NOTE — Progress Notes (Signed)
 Physical Therapy Session Note  Patient Details  Name: Tracy Baker MRN: [id number] Date of Birth: September 16, 1955  Today's Date: 03/17/2024 PT Individual Time: 1000-1042 PT Individual Time Calculation (min): 42 min   Short Term Goals: Week 1:  PT Short Term Goal 1 (Week 1): pt will perform bed mobility with min A consistantly PT Short Term Goal 2 (Week 1): pt will perform bed<>chair transfers with LRAD and CGA PT Short Term Goal 3 (Week 1): pt will ambulate 6ft with LRAD and min A  Skilled Therapeutic Interventions/Progress Updates:      Pt supine in bed upon arrival with nurse donning thigh high ted hose. Pt agreeable to therapy. Pt denies any pain.   Nurse present to change dressing on buttocks. Pt performed rolling to L with min A, verbal cues provided for not reaching for bed rail.   Pt performed L sidelying to sitting EOB with mod A for trunk. Pt endorses dizziness 2/2 vertigo when laying on L side, subsided with prolonged sitting. Donned abdominal binder while seated EOB. PT notiifed MD and PA to request bigger size.   Pt performed stand pivot transfer with RW and min A, verbal cues provided for UE positioning for maintenance of sternal precautions.   Pt performed stand pivot transfer WC to car simualtor at height of 23 inches with RW and min A, pt required mod A for power up from seat, and min A for pivot 2/2 fatigue.   Pt unsure if a family member will be bringing her to dialysis or if she will need transport. PT provided pt with home measurement sheet and requested pt have family member fill It out to best simulate pt home environment.   Pt attempted to self propel WC with B LE however has limited grip/contact with the floor. Pt asking if she can propel with B UE. Education provided on safety concerns with this 2/2 sternal precautions. PT recommended pt have family member bring in pair of shoes for improved contact to floor. Pt verbalized understanding and agreeable.   Pt seated  in WC at end of session with all needs within reach and seatbelt alarm on.       Therapy Documentation Precautions:  Precautions Precautions: Fall, Sternal Recall of Precautions/Restrictions: Impaired Precaution/Restrictions Comments: cortak Restrictions Weight Bearing Restrictions Per Provider Order: No Other Position/Activity Restrictions: Cardiac sternal precautions  Therapy/Group: Individual Therapy  [facility] Doreene Orris, [place], DPT  03/17/2024, 7:53 AM

## 2024-03-17 NOTE — Progress Notes (Signed)
" °   03/17/24 1925  Vitals  Temp 98 F (36.7 C)  Pulse Rate 77  Resp 20  BP (!) 124/58  SpO2 100 %  O2 Device Room Air  Weight (S)  69.6 kg (Bed Scale)  Type of Weight Post-Dialysis  Oxygen Therapy  Patient Activity (if Appropriate) In bed  Pulse Oximetry Type Continuous  Post Treatment  Dialyzer Clearance Clear  Hemodialysis Intake (mL) 0 mL  Liters Processed 90  Fluid Removed (mL) 0 mL  Tolerated HD Treatment Yes  Post-Hemodialysis Comments Tx. completed without difficulties. Pt. voice no complaints. Admin medication per order. Report called to 44M bedside Nurse.   Received patient in bed to unit.  Alert and oriented.  Informed consent signed and in chart.   TX duration: 3.75  Patient tolerated well.  Transported back to the room  Alert, without acute distress.  Hand-off given to patient's nurse.   Access used: Yes Access issues: No  Total UF removed: 0 Medication(s) given: See MAR Post HD VS: See Above Grid Post HD weight: 69.6 kg   [name] "

## 2024-03-17 NOTE — Progress Notes (Addendum)
 Met with pt at bedside to discuss out-pt HD plans. Pt stated confusion in receiving HD at a clinic when she leaves the hospital. Pt stated before she was in the hospital she was not seeing a nephrologist, and has no preferences. Pt prefers a MWF 2nd shift at a clinic close by her residence. Pt unsure of how she is getting to and from, requesting time to think about this, appeared overwhelmed. Pt does drive.  Will begin referral.   Tracy Baker Dialysis Nav [phone]  Addendum 2:38pm Referral submitted via [facility] admissions portal for [facility] . Awaiting financial and medical clearance. Will update with anything once accepted.

## 2024-03-17 NOTE — Plan of Care (Signed)
" °  Problem: Consults Goal: RH GENERAL PATIENT EDUCATION Description: See Patient Education module for education specifics. Outcome: Progressing Goal: Skin Care Protocol Initiated - if Braden Score 18 or less Description: If consults are not indicated, leave blank or document N/A Outcome: Progressing Goal: Nutrition Consult-if indicated Outcome: Progressing Goal: Diabetes Guidelines if Diabetic/Glucose > 140 Description: If diabetic or lab glucose is > 140 mg/dl - Initiate Diabetes/Hyperglycemia Guidelines & Document Interventions  Outcome: Progressing   Problem: RH BOWEL ELIMINATION Goal: RH STG MANAGE BOWEL WITH ASSISTANCE Description: STG Manage Bowel with mod I  Assistance. Outcome: Progressing Goal: RH STG MANAGE BOWEL W/MEDICATION W/ASSISTANCE Description: STG Manage Bowel with Medication with mod I Assistance. Outcome: Progressing   Problem: RH SKIN INTEGRITY Goal: RH STG SKIN FREE OF INFECTION/BREAKDOWN Description: Manage skin w min assist Outcome: Progressing Goal: RH STG MAINTAIN SKIN INTEGRITY WITH ASSISTANCE Description: STG Maintain Skin Integrity With Assistance. Outcome: Progressing Goal: RH STG ABLE TO PERFORM INCISION/WOUND CARE W/ASSISTANCE Description: STG Able To Perform Incision/Wound Care With min Assistance. Outcome: Progressing   Problem: RH SAFETY Goal: RH STG ADHERE TO SAFETY PRECAUTIONS W/ASSISTANCE/DEVICE Description: STG Adhere to Safety Precautions With cues Assistance/Device. Outcome: Progressing   Problem: RH PAIN MANAGEMENT Goal: RH STG PAIN MANAGED AT OR BELOW PT'S PAIN GOAL Description: Pain < 4 with prns Outcome: Progressing   Problem: RH KNOWLEDGE DEFICIT GENERAL Goal: RH STG INCREASE KNOWLEDGE OF SELF CARE AFTER HOSPITALIZATION Description: Patient and family will be able to manage care at discharge using educational resources for medication, skin care and dietary modification independently Outcome: Progressing   "

## 2024-03-17 NOTE — Progress Notes (Signed)
 Occupational Therapy Session Note  Patient Details  Name: Tracy Baker MRN: [id number] Date of Birth: 02-26-1956  Today's Date: 03/17/2024 OT Individual Time: 9194-9084 & 1105-1200 OT Individual Time Calculation (min): 70 min & 55 min   Short Term Goals: Week 1:  OT Short Term Goal 1 (Week 1): Pt will complete sit > stand with CGA using LRAD OT Short Term Goal 2 (Week 1): Pt will complete toilet transfer with min A using LRAD OT Short Term Goal 3 (Week 1): Pt will complete 2/3 toileting steps with CGA for balance OT Short Term Goal 4 (Week 1): Pt will adhere to sternal precautions 100% for 2 consecutive sessions with supervision  Skilled Therapeutic Interventions/Progress Updates:  Session 1 Skilled OT intervention completed with focus on activity tolerance, functional transfers. Pt received upright in bed, agreeable to session. Unrated pain reported in back of knees with knees noted to be in full extension with prevalon boots applied; pre-medicated. OT offered education on propping knees with pillow or with bent bed position for gentle knee flexion for positioning support at night; also offered gentle ambulation and rest breaks for pain reduction.  Pt initially was flat and not eager to get OOB- time spent to discuss pt's goals, IPR policy about 3 hr rule and 15/7 adjustment already made. Offered pt choices for increased autonomy and participation throughout session. Pt expressed increased anxiousness over the past several days and with new UTI finding, and notes new tremors in BUE which she wonders if is related to anxiety. Discussed that self-advocacy is one of the first steps to regaining her independence. Discussed neuropsych and letting MD/PA know for other potential interventions- secure chat sent to team.   Doffed prevalon boots dependently, applied thigh high TEDs dependently for time. Pt able to roll > R with supervision, but mod A needed for trunk elevation to adhere to sternal  precautions as pt has difficulty maintaining this with all mobility. Denied dizziness- completed min A sit > stand with RW, however pt pulling on front frame of RW requiring cues to reposition hands to 1 hand on RW and other on thigh, then CGA stand pivot with RW > w/c with cues for backing up and pt using BUE on RW to lower self- educated on safer method for chest being hands on thighs or 1 on chair and one on thigh. Set up A for oral care at sink.   Transported > hallway. Pt denied dizziness but did assess BP, 90/77 sitting in w/c- donned abdominal binder prior to mobility for hemodynamic support. CGA sit > stand RW, ambulated 20 ft CGA using RW with cues for upright gaze and stride length. W/c brought behind for fatigue. BP 103/66. Pt encouraged to stay in w/c for next therapy however requested to return to bed due to longer gap. Direct care hand off to NT due to OT time constraints.   Session 2 Skilled OT intervention completed with focus on activity tolerance, sit > stands, dynamic standing balance. Pt received seated in w/c, agreeable to session. No pain reported. Pt with improved affect this session, cutting jokes with OT and engaging in meaningful conversations.  Transported dependently in w/c <> gym. Min A sit > stand and CGA stand pivot with RW from w/c <> EOM with cues for hand placement for sternal precaution adherence.  Pt participated in the following dynamic standing balance and endurance tasks to promote independence and safety during BADLs and functional mobility: -cornhole toss activity. CGA/min A sit > stand with  RW. Required CGA/min A for dynamic balance due to frequent LOB in all directions. Seated rest breaks needed for fatigue.   Pt with questions about her medical history, reflecting on how being hospitalized since October can effect your overall medical and emotional well being.   In room, min A sit > stand with RW, then ambulated 7 ft with CGA > EOB with cues to sit towards  [facility] for efficiency with bed mobility. Supervision for sit > supine with cues for sternal precautions. Pt remained upright in bed, with bed alarm on/activated, and with all needs in reach at end of session.   Therapy Documentation Precautions:  Precautions Precautions: Fall, Sternal Recall of Precautions/Restrictions: Impaired Precaution/Restrictions Comments: cortak Restrictions Weight Bearing Restrictions Per Provider Order: No Other Position/Activity Restrictions: Cardiac sternal precautions    Therapy/Group: Individual Therapy  [name], MS, OTR/L  03/17/2024, 12:07 PM

## 2024-03-17 NOTE — Progress Notes (Signed)
 Patient ID: Tracy Baker, female   DOB: 03-20-1955, 69 y.o.   MRN: [id number]  Spoke with patient's daughter Tracy Baker  Would like to speak with a MD or PA for medical updates. Pam will call her when available.   Tracy Baker inquired about understanding more about therapy. SW encouraged her to plan on attending therapy sessions between 8-11, 9-12 or 1-4 any day she has therapy and call the hospital the night before and ask about specific times.   SW was able to provide information from the therapy notes per [name]'s request.

## 2024-03-17 NOTE — Progress Notes (Signed)
 "                                                        PROGRESS NOTE   Subjective/Complaints: Pt reports she would like someone to call her son to let him know what's going on; I explained I will try, but I'm in the office all day.  She is upset she has UTI- Bactrim  was started, but she feels she's been on so many ABX.   LBM yesterday. Denies pain.   ROS:   Pt denies SOB, abd pain, CP, N/V/C/D, and vision changes   Objective:   No results found.  Recent Labs    03/14/24 1224  WBC 10.0  HGB 12.3  HCT 36.5  PLT 163   Recent Labs    03/14/24 1400 03/15/24 0601  NA 132* 131*  K 4.7 4.1  CL 93* 92*  CO2 21* 26  GLUCOSE 114* 105*  BUN 104* 35*  CREATININE 3.90* 2.24*  CALCIUM  9.7 9.5    Intake/Output Summary (Last 24 hours) at 03/17/2024 9177 Last data filed at 03/17/2024 0745 Gross per 24 hour  Intake 478 ml  Output --  Net 478 ml     Wound 03/10/24 2051 Pressure Injury Sacrum Right Stage 2 -  Partial thickness loss of dermis presenting as a shallow open injury with a red, pink wound bed without slough. (Active)     Wound 03/15/24 1507 Pressure Injury Foot Left;Posterior Deep Tissue Pressure Injury - Purple or maroon localized area of discolored intact skin or blood-filled blister due to damage of underlying soft tissue from pressure and/or shear. (Active)    Physical Exam: Vital Signs Blood pressure 114/78, pulse 62, temperature 98.1 F (36.7 C), resp. rate 17, height 5' 4 (1.626 m), weight 70 kg, SpO2 98%.   General: awake, alert, appropriate, NAD; vitals reviewed HENT: conjugate gaze; oropharynx dry CV: regular rate and rhythm currently; no JVD Pulmonary: CTA B/L; no W/R/R- good air movement GI: soft, NT, ND, (+)BS normoactive Extr: No LE edema- wearing [employer] Psychiatric: appropriate, somewhat interactive- woke from sleep Neurological: Ox3  Psych: pleasant and cooperative  Skin: has chest wall incision, tunneled catheter on left chest wall,  right buttock wound and bilateral heel wounds--> left heel now with quarter sized stage II.  Neuro: Alert,4-/5 strength throughout, good attention, sensation is intact, stable 1/10   Assessment/Plan: 1. Functional deficits which require 3+ hours per day of interdisciplinary therapy in a comprehensive inpatient rehab setting. Physiatrist is providing close team supervision and 24 hour management of active medical problems listed below. Physiatrist and rehab team continue to assess barriers to discharge/monitor patient progress toward functional and medical goals  Care Tool:  Bathing    Body parts bathed by patient: Right arm, Left arm, Chest, Abdomen, Front perineal area, Face   Body parts bathed by helper: Buttocks, Right upper leg, Left upper leg, Right lower leg, Left lower leg     Bathing assist Assist Level: Moderate Assistance - Patient 50 - 74%     Upper Body Dressing/Undressing Upper body dressing   What is the patient wearing?: Pull over shirt    Upper body assist Assist Level: Maximal Assistance - Patient 25 - 49%    Lower Body Dressing/Undressing Lower body dressing      What is the patient wearing?:  Incontinence brief, Pants     Lower body assist Assist for lower body dressing: Total Assistance - Patient < 25%     Toileting Toileting    Toileting assist Assist for toileting: Moderate Assistance - Patient 50 - 74%     Transfers Chair/bed transfer  Transfers assist  Chair/bed transfer activity did not occur: Safety/medical concerns (pain, fatigue, dizziness)  Chair/bed transfer assist level: Minimal Assistance - Patient > 75%     Locomotion Ambulation   Ambulation assist   Ambulation activity did not occur: Safety/medical concerns (pain, fatigue, dizziness)  Assist level: Minimal Assistance - Patient > 75% Assistive device: Walker-rolling Max distance: 44ft   Walk 10 feet activity   Assist  Walk 10 feet activity did not occur:  Safety/medical concerns (pain, fatigue, dizziness)        Walk 50 feet activity   Assist Walk 50 feet with 2 turns activity did not occur: Safety/medical concerns (pain, fatigue, dizziness)         Walk 150 feet activity   Assist Walk 150 feet activity did not occur: Safety/medical concerns (pain, fatigue, dizziness)         Walk 10 feet on uneven surface  activity   Assist Walk 10 feet on uneven surfaces activity did not occur: Safety/medical concerns (pain, fatigue, dizziness)         Wheelchair     Assist Is the patient using a wheelchair?: Yes Type of Wheelchair: Manual Wheelchair activity did not occur: Safety/medical concerns (pain, fatigue, dizziness)         Wheelchair 50 feet with 2 turns activity    Assist    Wheelchair 50 feet with 2 turns activity did not occur: Safety/medical concerns (pain, fatigue, dizziness)       Wheelchair 150 feet activity     Assist  Wheelchair 150 feet activity did not occur: Safety/medical concerns (pain, fatigue, dizziness)       Blood pressure 114/78, pulse 62, temperature 98.1 F (36.7 C), resp. rate 17, height 5' 4 (1.626 m), weight 70 kg, SpO2 98%.  Medical Problem List and Plan: 1. Functional deficits secondary to debility after prolonged hospital stay related to mitral valve vegetations and MV repair and ultimate replacement.             -patient may  shower             -ELOS/Goals: 15 days, supervision goals with PT, OT, SLP  Con't CIR PT, OT and SLP  Wants me to call Son today- will attempt/try to do so, but explained might need to be tomorrow Continue Eliquis  Full code but no chest compressions  2. Hypotension: medications reviewed and is on amiodarone , d/c clonazepam , continue compression garments  3. Pain: A) Bilateral heel pain: Lyrica  25mg  scheduled HS B) Right ankle pain: ASO ordered, placed nursing for heat application in the morning for stiffness and ice in the afternoon to  reduce inflammation.  1/10 F/u XR reviewed and negative. Pt did not c/o pain today   1/12- No pain today 4. Insomnia: continue Remeron , scheduled melatonin        5. Neuropsych/cognition: This patient is capable of making decisions on her own behalf.  6. Skin/Wound Care: Air mattress overlay for pressure relief.             --continue Ensure and prosource             --added Vitamin C  and Zinc to promote wound healing.   1/11 left heel appears  to be evolution of what was present on admit   -continue above, prevalons   -discussed with pt importance of pressure relief, nutrition 7. Decreased appetite: discussed with patient that she feels this is improving, will d/c cortrak, discussed that family is bringing foods from home NO FR needed per  nephrology.    -appetite/intake is picking up.  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             --F/u with ID 01/06?-->discussed with Dr. Overton who will follow up with for input.   9. 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   10. AKI with dialysis initiated 02/16/24: On HD MWF at the end of the day to help with tolerance of therapy             Cr reviewed and is improving             --CLIP on hold. Needs to be able to sit up in a chair for HD.    11. A fib: Monitor HR TID--continue amiodarone  and eliquis .   12. Anemia of critical illness:Being monitored and stable.              --thrombocytopenia has resolved.   13. Urinary urgency: UA ordered 1/9, PVRs ordered, f/u UA  1/11 UA +, need UCX   -begin empiric bactrim  ss bid  1/12- Pt's upset has UTI- why do I have one at all- and thought she was already on ABX to prevent UTI- explained she was on antifungals, not ABX currently.  14. Daytime fatigue: D3 and B12 ordered (one time injection given red dye allergy), d/c seroquel , change lyrica  to HS, decreased therapy to 15/7, d/c clonazepam  at night, discussed that  remeron  could contribute but will maintain for now given insomnia and to help stimulate appetite, melatonin scheduled at night to help with insomnia  -1/11 this seems to be slowly improving.  -encourage better po intake -maximize sleep 15. Hyponatremia: Na level reviewed and this improved, continue to monitor Na levels with HD  1/12- doing better- 131- con't to monitor   I spent a total of 43   minutes on total care today- >50% coordination of care- due to  D/w pt and nursing about pt care- and UTI- educated pt on ABX vs antifungals- also reviewed last labs, vitals and B/B  LOS: 7 days A FACE TO FACE EVALUATION WAS PERFORMED  Raniyah Curenton 03/17/2024, 8:22 AM     "

## 2024-03-18 DIAGNOSIS — E871 Hypo-osmolality and hyponatremia: Secondary | ICD-10-CM

## 2024-03-18 DIAGNOSIS — N3000 Acute cystitis without hematuria: Secondary | ICD-10-CM

## 2024-03-18 LAB — BASIC METABOLIC PANEL WITH GFR
Anion gap: 11 (ref 5–15)
BUN: 29 mg/dL — ABNORMAL HIGH (ref 8–23)
CO2: 28 mmol/L (ref 22–32)
Calcium: 9.1 mg/dL (ref 8.9–10.3)
Chloride: 97 mmol/L — ABNORMAL LOW (ref 98–111)
Creatinine, Ser: 2.93 mg/dL — ABNORMAL HIGH (ref 0.44–1.00)
GFR, Estimated: 17 mL/min — ABNORMAL LOW
Glucose, Bld: 99 mg/dL (ref 70–99)
Potassium: 4.2 mmol/L (ref 3.5–5.1)
Sodium: 136 mmol/L (ref 135–145)

## 2024-03-18 LAB — GLUCOSE, CAPILLARY
Glucose-Capillary: 101 mg/dL — ABNORMAL HIGH (ref 70–99)
Glucose-Capillary: 96 mg/dL (ref 70–99)
Glucose-Capillary: 165 mg/dL — ABNORMAL HIGH (ref 70–99)
Glucose-Capillary: 101 mg/dL — ABNORMAL HIGH (ref 70–99)

## 2024-03-18 LAB — MAGNESIUM: Magnesium: 2 mg/dL (ref 1.7–2.4)

## 2024-03-18 LAB — URINE CULTURE: Culture: >=100000 — AB

## 2024-03-18 MED ORDER — SULFAMETHOXAZOLE-TRIMETHOPRIM 400-80 MG PO TABS
1.0000 | ORAL_TABLET | Freq: Two times a day (BID) | ORAL | Status: AC
  Administered 2024-03-18 – 2024-03-21 (×5): 1 via ORAL
  Filled 2024-03-18 (×6): qty 1

## 2024-03-18 NOTE — Progress Notes (Addendum)
 "                                                        PROGRESS NOTE   Subjective/Complaints: Patient reports she did not sleep well yesterday because of the dialysis, she has melatonin ordered at bedtime.  She is not interested in additional medication at this time. She initially asked about someone calling her son, but later decided she would like to wait until Dr. Lorilee is back tomorrow.  Reports for the last 2 days urine urgency has been better. LBM this morning, denies pain.  ROS:   Pt denies SOB, abd pain, CP, N/V/C/D, and vision changes +poor sleep last night  Objective:   No results found.  Recent Labs    03/17/24 0622  WBC 8.7  HGB 10.3*  HCT 30.7*  PLT 157   Recent Labs    03/18/24 0517  NA 136  K 4.2  CL 97*  CO2 28  GLUCOSE 99  BUN 29*  CREATININE 2.93*  CALCIUM  9.1    Intake/Output Summary (Last 24 hours) at 03/18/2024 1329 Last data filed at 03/18/2024 9262 Gross per 24 hour  Intake 120 ml  Output 0 ml  Net 120 ml     Wound 03/10/24 2051 Pressure Injury Sacrum Right Stage 2 -  Partial thickness loss of dermis presenting as a shallow open injury with a red, pink wound bed without slough. (Active)     Wound 03/15/24 1507 Pressure Injury Foot Left;Posterior Deep Tissue Pressure Injury - Purple or maroon localized area of discolored intact skin or blood-filled blister due to damage of underlying soft tissue from pressure and/or shear. (Active)    Physical Exam: Vital Signs Blood pressure 102/61, pulse 80, temperature (!) 97.5 F (36.4 C), temperature source Axillary, resp. rate 18, height 5' 4 (1.626 m), weight 69.4 kg, SpO2 98%.   General: awake, alert, appropriate, NAD, laying in bed appears comfortable; vitals reviewed HENT: conjugate gaze; oropharynx dry CV: regular rate and rhythm currently; no JVD Pulmonary: CTA B/L; nonlabored GI: soft, NT, ND, (+)BS normoactive Extr: No LE edema Psychiatric: appropriate, cooperative Neurological:  Ox3  Skin: has chest wall incision, tunneled catheter on left chest wall, right buttock wound and bilateral heel wounds--> left heel now with quarter sized stage II- not examined today 1/13  Neuro: Alert,4-/5 strength throughout, good attention, sensation is intact, stable   Assessment/Plan: 1. Functional deficits which require 3+ hours per day of interdisciplinary therapy in a comprehensive inpatient rehab setting. Physiatrist is providing close team supervision and 24 hour management of active medical problems listed below. Physiatrist and rehab team continue to assess barriers to discharge/monitor patient progress toward functional and medical goals  Care Tool:  Bathing    Body parts bathed by patient: Right arm, Left arm, Chest, Abdomen, Front perineal area, Face   Body parts bathed by helper: Buttocks, Right upper leg, Left upper leg, Right lower leg, Left lower leg     Bathing assist Assist Level: Moderate Assistance - Patient 50 - 74%     Upper Body Dressing/Undressing Upper body dressing   What is the patient wearing?: Pull over shirt    Upper body assist Assist Level: Maximal Assistance - Patient 25 - 49%    Lower Body Dressing/Undressing Lower body dressing      What  is the patient wearing?: Incontinence brief, Pants     Lower body assist Assist for lower body dressing: Total Assistance - Patient < 25%     Toileting Toileting    Toileting assist Assist for toileting: Moderate Assistance - Patient 50 - 74%     Transfers Chair/bed transfer  Transfers assist  Chair/bed transfer activity did not occur: Safety/medical concerns (pain, fatigue, dizziness)  Chair/bed transfer assist level: Contact Guard/Touching assist     Locomotion Ambulation   Ambulation assist   Ambulation activity did not occur: Safety/medical concerns (pain, fatigue, dizziness)  Assist level: Contact Guard/Touching assist Assistive device: Walker-rolling Max distance: 65ft    Walk 10 feet activity   Assist  Walk 10 feet activity did not occur: Safety/medical concerns (pain, fatigue, dizziness)  Assist level: Contact Guard/Touching assist Assistive device: Walker-rolling   Walk 50 feet activity   Assist Walk 50 feet with 2 turns activity did not occur: Safety/medical concerns (pain, fatigue, dizziness)         Walk 150 feet activity   Assist Walk 150 feet activity did not occur: Safety/medical concerns (pain, fatigue, dizziness)         Walk 10 feet on uneven surface  activity   Assist Walk 10 feet on uneven surfaces activity did not occur: Safety/medical concerns (pain, fatigue, dizziness)         Wheelchair     Assist Is the patient using a wheelchair?: Yes Type of Wheelchair: Manual Wheelchair activity did not occur: Safety/medical concerns (pain, fatigue, dizziness)         Wheelchair 50 feet with 2 turns activity    Assist    Wheelchair 50 feet with 2 turns activity did not occur: Safety/medical concerns (pain, fatigue, dizziness)       Wheelchair 150 feet activity     Assist  Wheelchair 150 feet activity did not occur: Safety/medical concerns (pain, fatigue, dizziness)       Blood pressure 102/61, pulse 80, temperature (!) 97.5 F (36.4 C), temperature source Axillary, resp. rate 18, height 5' 4 (1.626 m), weight 69.4 kg, SpO2 98%.  Medical Problem List and Plan: 1. Functional deficits secondary to debility after prolonged hospital stay related to mitral valve vegetations and MV repair and ultimate replacement.             -patient may  shower             -ELOS/Goals: 15 days, supervision goals with PT, OT, SLP  Con't CIR PT, OT and SLP Continue Eliquis  Full code but no chest compressions Patient would like family notified regarding her overall progress, she would like to wait till her primary doctor Dr. Lorilee returns tomorrow.  If she changes her mind I let her know I will be happy to give them  a call. -Message from SW- daughter requesting call-daughter would prefer patient stay longer than current discharge date-Will defer decision until team conference tomorrow and Dr. Lorilee will be there tomorrow.  After team meeting daughter requests somebody call her to let her know regarding decision, if she does not pick up she would like message to be left and then she can call back. - Daughter also asked about patient having bed alarm-will pass along to nursing to check on this  2. Hypotension: medications reviewed and is on amiodarone , d/c clonazepam , continue compression garments  3. Pain: A) Bilateral heel pain: Lyrica  25mg  scheduled HS B) Right ankle pain: ASO ordered, placed nursing for heat application in the morning for  stiffness and ice in the afternoon to reduce inflammation.  1/10 F/u XR reviewed and negative. Pt did not c/o pain today   1/12-13 No pain today 4. Insomnia: continue Remeron , scheduled melatonin        5. Neuropsych/cognition: This patient is capable of making decisions on her own behalf.  6. Skin/Wound Care: Air mattress overlay for pressure relief.             --continue Ensure and prosource             --added Vitamin C  and Zinc to promote wound healing.   1/11 left heel appears to be evolution of what was present on admit   -continue above, prevalons   -discussed with pt importance of pressure relief, nutrition  7. Decreased appetite: discussed with patient that she feels this is improving, will d/c cortrak, discussed that family is bringing foods from home NO FR needed per  nephrology.    -appetite/intake is picking up.  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             --F/u with ID 01/06?-->discussed with Dr. Overton who will follow up with for input.   9. 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   10. AKI with dialysis initiated 02/16/24: On HD MWF at the end of the day to  help with tolerance of therapy             Cr reviewed and is improving             --CLIP on hold. Needs to be able to sit up in a chair for HD.    11. A fib: Monitor HR TID--continue amiodarone  and eliquis .   12. Anemia of critical illness:Being monitored and stable.              --thrombocytopenia has resolved.   13. Urinary urgency: UA ordered 1/9, PVRs ordered, f/u UA  1/11 UA +, need UCX   -begin empiric bactrim  ss bid  1/12- Pt's upset has UTI- why do I have one at all- and thought she was already on ABX to prevent UTI- explained she was on antifungals, not ABX currently.  -1/13 urine culture with Enterobacter cloaca sensitive to Bactrim , continue current regimen, will continue for 3 additional days  14. Daytime fatigue: D3 and B12 ordered (one time injection given red dye allergy), d/c seroquel , change lyrica  to HS, decreased therapy to 15/7, d/c clonazepam  at night, discussed that remeron  could contribute but will maintain for now given insomnia and to help stimulate appetite, melatonin scheduled at night to help with insomnia  -1/11 this seems to be slowly improving.  -encourage better po intake -maximize sleep -1/13 patient reports sleep interrupted by dialysis, she would like to hold off any medication changes at this time 15. Hyponatremia: Na level reviewed and this improved, continue to monitor Na levels with HD  1/12- doing better- 131- con't to monitor  1/13 improved today to 136    LOS: 8 days A FACE TO FACE EVALUATION WAS PERFORMED  Murray Collier 03/18/2024, 1:29 PM     "

## 2024-03-18 NOTE — Progress Notes (Signed)
 Pt has been tentatively accepted to start at [facility] on 03/27/24, for a TTS, 1230 pm chair time. Will plan to meet with pt at bedside tomorrow to discuss once navigator is back at [facility].   Contacted pt daughter veronica [phone]) to discuss. Navigator explaied role and provided updates that pt has been approved an out-pt HD spot. Provided dates, times, and address during this call. She is agreeable to this schedule. Once confirmed tomorrow that this schedule is okay with pt, navigator will provide schedule letter at bedside and inform whole care team.   During this call, pt daughter expressed concerns. Pt does drive, but pt daughter does not think she will be able to drive herself to appointments if discharged this early. Daughter stated that pt does have family support, however she works rotating shifts, so will not always be able to take her mother to these appts. Will reach out social work to discuss further transportation options.   Pt daughter expressed, in general, much concern that pt will not be stable for d/c in a week. Pt daughter requesting to speak with whoever is in charge of this. Navigator will inform attending that daughter is requesting call.   Lavanda Mackenzie Lia Dialysis Navigator [phone]

## 2024-03-18 NOTE — Progress Notes (Signed)
 Occupational Therapy Session Note  Patient Details  Name: Tracy Baker MRN: [id number] Date of Birth: [birth year]  Today's Date: 03/18/2024 OT Individual Time: 1305-1330 OT Individual Time Calculation (min): 25 min  and Today's Date: 03/18/2024 OT Missed Time: 50 Minutes Missed Time Reason: Other (comment) (nephrology consult with pt and pt's daughter)   Short Term Goals: Week 1:  OT Short Term Goal 1 (Week 1): Pt will complete sit > stand with CGA using LRAD OT Short Term Goal 2 (Week 1): Pt will complete toilet transfer with min A using LRAD OT Short Term Goal 3 (Week 1): Pt will complete 2/3 toileting steps with CGA for balance OT Short Term Goal 4 (Week 1): Pt will adhere to sternal precautions 100% for 2 consecutive sessions with supervision  Skilled Therapeutic Interventions/Progress Updates:  Skilled OT intervention completed with focus on DC planning and family education. Pt received in bed with daughter present visiting, agreeable to session. No pain reported.  Daughter with concerns about pt's current DC date and pt status. Reported that pt has option to go to her house that has level entry and can accommodate pt on main floor of home with bathroom or other option is pts apartment with 6 STE and pt hasn't completed on rehab yet. Discussed how mobility is anxiety provoking for pt and she is significantly deconditioned, therefore the less challenges for her especially due to frequency for out of home appointments that will be needed with HD. Pt didn't appear open to her daughters home however.  Reviewed with daughter the pt's physical challenges with frequent fatigue, BP fluctuations and current goals of getting pt to engage in full length of therapy sessions. Informed them of team conference to address daughters concerns but that extension is not very likely due to pt progress or slower than anticipated at this current stage. MD from nephrology present for education with daughter/pt  about HD at DC. Pt missed 50 mins of OT intervention secondary to nephrology consult; OT will make up missed time as able.   Pt remained upright in bed, with bed alarm on/activated, and with all needs in reach at end of session.     Therapy Documentation Precautions:  Precautions Precautions: Fall, Sternal Recall of Precautions/Restrictions: Impaired Precaution/Restrictions Comments: cortak Restrictions Weight Bearing Restrictions Per Provider Order: No Other Position/Activity Restrictions: Cardiac sternal precautions    Therapy/Group: Individual Therapy  [name], MS, OTR/L  03/18/2024, 3:40 PM

## 2024-03-18 NOTE — Plan of Care (Signed)
" °  Problem: Consults Goal: RH GENERAL PATIENT EDUCATION Description: See Patient Education module for education specifics. Outcome: Progressing Goal: Skin Care Protocol Initiated - if Braden Score 18 or less Description: If consults are not indicated, leave blank or document N/A Outcome: Progressing Goal: Nutrition Consult-if indicated Outcome: Progressing Goal: Diabetes Guidelines if Diabetic/Glucose > 140 Description: If diabetic or lab glucose is > 140 mg/dl - Initiate Diabetes/Hyperglycemia Guidelines & Document Interventions  Outcome: Progressing   Problem: RH BOWEL ELIMINATION Goal: RH STG MANAGE BOWEL WITH ASSISTANCE Description: STG Manage Bowel with mod I  Assistance. Outcome: Progressing Goal: RH STG MANAGE BOWEL W/MEDICATION W/ASSISTANCE Description: STG Manage Bowel with Medication with mod I Assistance. Outcome: Progressing   Problem: RH SKIN INTEGRITY Goal: RH STG SKIN FREE OF INFECTION/BREAKDOWN Description: Manage skin w min assist Outcome: Progressing Goal: RH STG MAINTAIN SKIN INTEGRITY WITH ASSISTANCE Description: STG Maintain Skin Integrity With Assistance. Outcome: Progressing Goal: RH STG ABLE TO PERFORM INCISION/WOUND CARE W/ASSISTANCE Description: STG Able To Perform Incision/Wound Care With min Assistance. Outcome: Progressing   Problem: RH SAFETY Goal: RH STG ADHERE TO SAFETY PRECAUTIONS W/ASSISTANCE/DEVICE Description: STG Adhere to Safety Precautions With cues Assistance/Device. Outcome: Progressing   Problem: RH PAIN MANAGEMENT Goal: RH STG PAIN MANAGED AT OR BELOW PT'S PAIN GOAL Description: Pain < 4 with prns Outcome: Progressing   Problem: RH KNOWLEDGE DEFICIT GENERAL Goal: RH STG INCREASE KNOWLEDGE OF SELF CARE AFTER HOSPITALIZATION Description: Patient and family will be able to manage care at discharge using educational resources for medication, skin care and dietary modification independently Outcome: Progressing   "

## 2024-03-18 NOTE — Progress Notes (Signed)
 Occupational Therapy Weekly Progress Note  Patient Details  Name: Tracy Baker MRN: [id number] Date of Birth: 09/11/1955  Beginning of progress report period: March 11, 2024 End of progress report period: March 18, 2024   Patient has met 3 of 4 short term goals. Pt is making gradual progress towards LTGs. She is able to bathe with min A, UB dress with supervision, LB dress with min A and completes toileting with min A. In the past week, pt has been mostly limited by fatigue, deconditioning, navigating R heel/ankle pain and anxiety with mobility. In addition, pt continues to demonstrate poor adherence (though improving) to sternal precautions, cognitive (problem solving, awareness), functional endurance and dynamic standing balance deficits resulting in difficulty completing BADL tasks without increased physical assist. Pt will benefit from continued skilled OT services to focus on mentioned deficits. Family ed not initiated as of date and will be needed in prep for DC.   Patient continues to demonstrate the following deficits: muscle weakness, decreased cardiorespiratoy endurance, decreased coordination, decreased awareness and decreased problem solving, and decreased standing balance and difficulty maintaining precautions and therefore will continue to benefit from skilled OT intervention to enhance overall performance with BADL and Reduce care partner burden.  Patient progressing toward long term goals..  Continue plan of care.  OT Short Term Goals Week 1:  OT Short Term Goal 1 (Week 1): Pt will complete sit > stand with CGA using LRAD OT Short Term Goal 1 - Progress (Week 1): Met OT Short Term Goal 2 (Week 1): Pt will complete toilet transfer with min A using LRAD OT Short Term Goal 2 - Progress (Week 1): Met OT Short Term Goal 3 (Week 1): Pt will complete 2/3 toileting steps with CGA for balance OT Short Term Goal 3 - Progress (Week 1): Met OT Short Term Goal 4 (Week 1): Pt will  adhere to sternal precautions 100% for 2 consecutive sessions with supervision OT Short Term Goal 4 - Progress (Week 1): Progressing toward goal Week 2:  OT Short Term Goal 1 (Week 2): STG = LTG due to ELOS   Lilly Gasser E Karson Chicas, MS, OTR/L  03/18/2024, 7:59 AM

## 2024-03-18 NOTE — Progress Notes (Signed)
 " [facility] Progress Note    Assessment/ Plan:   Dialysis dependent AKI, initiated 02/16/2024: Creatinine normal at baseline Currently on MWF schedule: 3.75h, no heparin . TDC.  Following UOP and labs and intradialytic window for any suggestive GFR recovery, none to date; reminded CIR staff to try to capture all UOP Currently using Left IJ TDC placed on 12/22 with IR.   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 On antifungal therapy  Anemia -transfuse PRN for Hgb <7; Hb 10s  Hyponatremia: mild and intermittent.  Manage volume with dialysis, na/fluid restriction  Will follow.    Subjective:    HD yesterday, no issues UOP not being quantified Not having daily labs in order to track potential GFR recovery Daughter at bedside, reviewed kidney status and potential next steps including outpatient dialysis, how to monitor for GFR recovery.   Objective:   BP 102/61 (BP Location: Left Arm)   Pulse 80   Temp (!) 97.5 F (36.4 C) (Axillary)   Resp 18   Ht 5' 4 (1.626 m)   Wt 69.4 kg   SpO2 98%   BMI 26.26 kg/m   Intake/Output Summary (Last 24 hours) at 03/18/2024 1402 Last data filed at 03/18/2024 0737 Gross per 24 hour  Intake 120 ml  Output 0 ml  Net 120 ml    Weight change: -0.101 kg  Physical Exam: Gen: tired appearing, NAD CVS: normal rate, no rub Resp: bl chest rise, normal wob Abd: soft Ext: No significant edema noted Dialysis access:IJ tunneled  HD cath  Imaging: No results found.       Labs: BMET Recent Labs  Lab 03/12/24 1142 03/12/24 1530 03/13/24 1027 03/14/24 1400 03/15/24 0601 03/18/24 0517  NA 127* 128* 130* 132* 131* 136  K 4.9 4.8 4.5 4.7 4.1 4.2  CL 87* 89* 91* 93* 92* 97*  CO2 21* 20* 23 21* 26 28  GLUCOSE 146* 118* 137* 114* 105* 99  BUN 121* 123* 72* 104* 35* 29*  CREATININE 3.94* 3.94* 2.68* 3.90* 2.24* 2.93*   CALCIUM  10.0 9.8 10.0 9.7 9.5 9.1  PHOS  --  5.7*  --   --   --   --    CBC Recent Labs  Lab 03/12/24 1142 03/12/24 1530 03/13/24 1027 03/14/24 1224 03/17/24 0622  WBC 10.3 9.5 10.9* 10.0 8.7  NEUTROABS 8.3*  --  8.6* 8.0* 6.0  HGB 12.0 10.3* 12.4 12.3 10.3*  HCT 35.7* 31.0* 37.2 36.5 30.7*  MCV 95.2 98.4 95.6 95.1 96.2  PLT 158 134* 193 163 157    Medications:     amiodarone   200 mg Oral Daily   apixaban   5 mg Oral BID   ascorbic acid   500 mg Oral Q supper   cholecalciferol   1,000 Units Oral Daily   feeding supplement (GLUCERNA SHAKE)  237 mL Oral TID BM   fluconazole   400 mg Oral Q supper   Followed by   NOREEN ON 05/05/2024] fluconazole   200 mg Oral Daily   Gerhardt's butt cream   Topical BID   hydrocerin   Topical BID   insulin  aspart  0-9 Units Subcutaneous TID AC & HS   megestrol   80 mg Oral BID   melatonin  3 mg Oral QHS   midodrine   10 mg Oral TID WC   mirtazapine   30 mg Oral QHS   mouth rinse  15 mL Mouth Rinse 4 times per day   pantoprazole   40 mg Oral Daily   pregabalin   25 mg Oral QHS   sertraline   25 mg Oral QHS   sulfamethoxazole -trimethoprim   1 tablet Oral Q12H       "

## 2024-03-18 NOTE — Progress Notes (Signed)
 Physical Therapy Session Note  Patient Details  Name: Tracy Baker MRN: [id number] Date of Birth: January 16, 1956  Today's Date: 03/18/2024 PT Individual Time: 0731-0840 PT Individual Time Calculation (min): 69 min   Short Term Goals: Week 1:  PT Short Term Goal 1 (Week 1): pt will perform bed mobility with min A consistantly PT Short Term Goal 2 (Week 1): pt will perform bed<>chair transfers with LRAD and CGA PT Short Term Goal 3 (Week 1): pt will ambulate 57ft with LRAD and min A  Skilled Therapeutic Interventions/Progress Updates:   Received pt semi-reclined in bed, pt agreeable to PT treatment, and denied any pain during session but reports ongoing fatigue. Pt required ++ time to get going this morning, stating 7:30 session was too early. Session with emphasis on functional mobility/transfers, dressing, generalized strengthening and endurance, dynamic standing balance/coordination, toileting, and ambulation. Provided pt with scrub clothing and gripper socks (++ time to locate due to supply shortage). Pt transferred semi-reclined<>sitting L EOB with HOB elevated and CGA (pt pushing with BUE despite cues, but verbalized not putting pressure through UEs). Donned scrub pants with max A and removed dirty scrub top and donned clean one with supervision.   Pt stood from EOB with RW and min A (required multiple attempts to stand due to posterior bias) and required mod A to pull pants over hips. Performed stand<>pivot into WC with RW and CGA and Nepholoogy MD/PA arrived for brief assessment. Pt sat in WC at sink and brsuhed teeth/washed face with setup assist. Pt transported to/from room in [facility] dependently, stood with RW and min A, and ambulated 39ft with RW and CGA - limited by fatigue but denied any dizziness. Pt then reported urge to toilet - returned to room and transferred on/off toilet using grab bar and min A and able to manage clothing with min A. Pt with small BM and performed hygiene management  without assist. Sat in WC and performed hand hygiene, then requested to return to bed. Required min A to stand and CGA for stand<>pivot. Transitioned into supine with close supervision and concluded session with pt semi-reclined in bed with all needs within reach.   Therapy Documentation Precautions:  Precautions Precautions: Fall, Sternal Recall of Precautions/Restrictions: Impaired Precaution/Restrictions Comments:  Restrictions Weight Bearing Restrictions Per Provider Order: No Other Position/Activity Restrictions: Cardiac sternal precautions  Therapy/Group: Individual Therapy [name] [name] PT, DPT 03/18/2024, 6:55 AM

## 2024-03-18 NOTE — Progress Notes (Signed)
 Pt Son called and expressed his concerns about the meeting taking place in the morning. He is concerned that the pt is not ready for discharge and would like for someone to call him while things are being discussed with his mom tomorrow at bedside. His name is Tracy Baker and his contact number is [phone] If someone could give him a call while discussing things with the pt. Daughter will not be available to take a call due to work. I will pass this message to the incoming nurse in the am.

## 2024-03-19 ENCOUNTER — Ambulatory Visit: Admitting: Physician Assistant

## 2024-03-19 LAB — RENAL FUNCTION PANEL
Albumin: 3.7 g/dL (ref 3.5–5.0)
Anion gap: 16 — ABNORMAL HIGH (ref 5–15)
BUN: 46 mg/dL — ABNORMAL HIGH (ref 8–23)
CO2: 25 mmol/L (ref 22–32)
Calcium: 9.5 mg/dL (ref 8.9–10.3)
Chloride: 98 mmol/L (ref 98–111)
Creatinine, Ser: 4.35 mg/dL — ABNORMAL HIGH (ref 0.44–1.00)
GFR, Estimated: 10 mL/min — ABNORMAL LOW
Glucose, Bld: 94 mg/dL (ref 70–99)
Phosphorus: 4.7 mg/dL — ABNORMAL HIGH (ref 2.5–4.6)
Potassium: 4.4 mmol/L (ref 3.5–5.1)
Sodium: 138 mmol/L (ref 135–145)

## 2024-03-19 LAB — MAGNESIUM: Magnesium: 2.1 mg/dL (ref 1.7–2.4)

## 2024-03-19 LAB — GLUCOSE, CAPILLARY
Glucose-Capillary: 88 mg/dL (ref 70–99)
Glucose-Capillary: 111 mg/dL — ABNORMAL HIGH (ref 70–99)
Glucose-Capillary: 183 mg/dL — ABNORMAL HIGH (ref 70–99)

## 2024-03-19 MED ORDER — HEPARIN SODIUM (PORCINE) 1000 UNIT/ML IJ SOLN
INTRAMUSCULAR | Status: AC
  Filled 2024-03-19: qty 5

## 2024-03-19 MED ORDER — VITAMIN D 25 MCG (1000 UNIT) PO TABS: 2000.0000 [IU] | ORAL_TABLET | Freq: Every day | ORAL | Status: DC

## 2024-03-19 NOTE — Progress Notes (Addendum)
 Met at bedside with pt to discuss out-pt HD schedule. Pt is agreeable to start at [facility] on 03/27/24, for a TTS, 1230 pm chair, arrive 1 hr early first day. Pt had no further questions. Schedule letter with all info including address of clinic, start date, and time provided and left at bedside.   AVS updated. Care team including social work, nephrology, and attending have been updated.  Care team including attending informed yesterday of daughters concerns regarding length of stay  Forbes Loll Dialysis Navigator [phone]

## 2024-03-19 NOTE — Procedures (Signed)
 HD Note:  Some information was entered later than the data was gathered due to patient care needs. The stated time with the data is accurate.  Received patient in bed to unit.   Alert and oriented.   Informed consent signed and in chart.   Access used: upper left chest HD catheter Access issues: None  Patient ordered SBP > 110 during treatment.  Patient dipped below this and goal was lowered to 1L, still within orders.  Alert, without acute distress.  Total UF removed: 1000 ml  Hand-off given to patient's nurse.   Transported back to the room   Valentino Saavedra L. Lenon, RN [facility].

## 2024-03-19 NOTE — Progress Notes (Signed)
 "                                                        PROGRESS NOTE   Subjective/Complaints: No new complaints this morning Asks that I call her daughter this morning, but nursing note reviewed in chart and son is requesting call since daughter will be working  ROS:  Pt denies SOB, abd pain, CP, N/V/C/D, and vision changes +fatigue  Objective:   No results found.  Recent Labs    03/17/24 0622  WBC 8.7  HGB 10.3*  HCT 30.7*  PLT 157   Recent Labs    03/18/24 0517 03/19/24 0456  NA 136 138  K 4.2 4.4  CL 97* 98  CO2 28 25  GLUCOSE 99 94  BUN 29* 46*  CREATININE 2.93* 4.35*  CALCIUM  9.1 9.5   No intake or output data in the 24 hours ending 03/19/24 0911    Wound 03/10/24 2051 Pressure Injury Sacrum Right Stage 2 -  Partial thickness loss of dermis presenting as a shallow open injury with a red, pink wound bed without slough. (Active)     Wound 03/15/24 1507 Pressure Injury Foot Left;Posterior Deep Tissue Pressure Injury - Purple or maroon localized area of discolored intact skin or blood-filled blister due to damage of underlying soft tissue from pressure and/or shear. (Active)    Physical Exam: Vital Signs Blood pressure 102/74, pulse 65, temperature 97.8 F (36.6 C), resp. rate 17, height 5' 4 (1.626 m), weight 69.4 kg, SpO2 99%.  General: awake, alert, appropriate, NAD, laying in bed appears comfortable; vitals reviewed HENT: conjugate gaze; oropharynx dry CV: regular rate and rhythm currently; no JVD Pulmonary: CTA B/L; nonlabored GI: soft, NT, ND, (+)BS normoactive Extr: No LE edema Psychiatric: appropriate, cooperative Neurological: Ox3  Skin: has chest wall incision, tunneled catheter on left chest wall, right buttock wound and bilateral heel wounds--> left heel now with quarter sized stage II- not examined today 1/14  Neuro: Alert,4-/5 strength throughout, good attention, sensation is intact, stable 1/14   Assessment/Plan: 1. Functional  deficits which require 3+ hours per day of interdisciplinary therapy in a comprehensive inpatient rehab setting. Physiatrist is providing close team supervision and 24 hour management of active medical problems listed below. Physiatrist and rehab team continue to assess barriers to discharge/monitor patient progress toward functional and medical goals  Care Tool:  Bathing    Body parts bathed by patient: Right arm, Left arm, Chest, Abdomen, Front perineal area, Face   Body parts bathed by helper: Buttocks, Right upper leg, Left upper leg, Right lower leg, Left lower leg     Bathing assist Assist Level: Moderate Assistance - Patient 50 - 74%     Upper Body Dressing/Undressing Upper body dressing   What is the patient wearing?: Pull over shirt    Upper body assist Assist Level: Maximal Assistance - Patient 25 - 49%    Lower Body Dressing/Undressing Lower body dressing      What is the patient wearing?: Incontinence brief, Pants     Lower body assist Assist for lower body dressing: Total Assistance - Patient < 25%     Toileting Toileting    Toileting assist Assist for toileting: Moderate Assistance - Patient 50 - 74%     Transfers Chair/bed transfer  Transfers assist  Chair/bed transfer activity did not occur: Safety/medical concerns (pain, fatigue, dizziness)  Chair/bed transfer assist level: Contact Guard/Touching assist     Locomotion Ambulation   Ambulation assist   Ambulation activity did not occur: Safety/medical concerns (pain, fatigue, dizziness)  Assist level: Contact Guard/Touching assist Assistive device: Walker-rolling Max distance: 22ft   Walk 10 feet activity   Assist  Walk 10 feet activity did not occur: Safety/medical concerns (pain, fatigue, dizziness)  Assist level: Minimal Assistance - Patient > 75% Assistive device: Walker-rolling   Walk 50 feet activity   Assist Walk 50 feet with 2 turns activity did not occur: Safety/medical  concerns (pain, fatigue, dizziness)         Walk 150 feet activity   Assist Walk 150 feet activity did not occur: Safety/medical concerns (pain, fatigue, dizziness)         Walk 10 feet on uneven surface  activity   Assist Walk 10 feet on uneven surfaces activity did not occur: Safety/medical concerns (pain, fatigue, dizziness)         Wheelchair     Assist Is the patient using a wheelchair?: Yes Type of Wheelchair: Manual Wheelchair activity did not occur: Safety/medical concerns (pain, fatigue, dizziness)         Wheelchair 50 feet with 2 turns activity    Assist    Wheelchair 50 feet with 2 turns activity did not occur: Safety/medical concerns (pain, fatigue, dizziness)       Wheelchair 150 feet activity     Assist  Wheelchair 150 feet activity did not occur: Safety/medical concerns (pain, fatigue, dizziness)       Blood pressure 102/74, pulse 65, temperature 97.8 F (36.6 C), resp. rate 17, height 5' 4 (1.626 m), weight 69.4 kg, SpO2 99%.  Medical Problem List and Plan: 1. Functional deficits secondary to debility after prolonged hospital stay related to mitral valve vegetations and MV repair and ultimate replacement.             -patient may  shower             -ELOS/Goals: 15 days, supervision goals with PT, OT, SLP  Con't CIR PT, OT and SLP Continue Eliquis  Will call son to update him today Full code but no chest compressions -Message from SW- daughter requesting call-daughter would prefer patient stay longer than current discharge date-Will defer decision until team conference tomorrow and Dr. Lorilee will be there tomorrow.  After team meeting daughter requests somebody call her to let her know regarding decision, if she does not pick up she would like message to be left and then she can call back. - Daughter also asked about patient having bed alarm-will pass along to nursing to check on this  2. Hypotension: medications reviewed  and is on amiodarone , d/c clonazepam , continue compression garments, continue midodrine  10mg  TID, continue abdominal binder  3. Pain: A) Bilateral heel pain: continue Lyrica  25mg  HS  B) Right ankle pain: ASO ordered, placed nursing for heat application in the morning for stiffness and ice in the afternoon to reduce inflammation. XR reviewed and is stable  4. Insomnia: continue Remeron , scheduled melatonin        5. Neuropsych/cognition: This patient is capable of making decisions on her own behalf.  6. Skin/Wound Care: Air mattress overlay for pressure relief.             --continue Ensure and prosource             --added Vitamin C  and Zinc  to promote wound healing.   1/11 left heel appears to be evolution of what was present on admit   -continue above, prevalons   -discussed with pt importance of pressure relief, nutrition  7. Decreased appetite: discussed with patient that she feels this is improving, will d/c cortrak, discussed that family is bringing foods from home NO FR needed per  nephrology.    -appetite/intake is picking up.  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             --F/u with ID 01/06?-->discussed with Dr. Overton who will follow up with for input.   9. 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   10. AKI with dialysis initiated 02/16/24: On HD MWF at the end of the day to help with tolerance of therapy             Cr reviewed and is improving             --CLIP on hold. Needs to be able to sit up in a chair for HD.    11. A fib: Monitor HR TID--continue amiodarone  and eliquis .   12. Anemia of critical illness:Being monitored and stable.              --thrombocytopenia has resolved.   13. Urinary urgency: UA ordered 1/9, PVRs ordered, f/u UA  1/11 UA +, need UCX   -begin empiric bactrim  ss bid  1/12- Pt's upset has UTI- why do I have one at all- and thought she was already on ABX  to prevent UTI- explained she was on antifungals, not ABX currently.  -1/13 urine culture with Enterobacter cloaca sensitive to Bactrim , continue current regimen, will continue for 3 additional days  14. Daytime fatigue:  -continue D3- increase to 2,000U daily B12 ordered (one time injection given red dye allergy), d/c seroquel , change lyrica  to HS, decreased therapy to 15/7, d/c clonazepam  at night, discussed that remeron  could contribute but will maintain for now given insomnia and to help stimulate appetite, melatonin scheduled at night to help with insomnia  15. Hyponatremia: Na level reviewed and this improved, continue to monitor Na levels with HD    LOS: 9 days A FACE TO FACE EVALUATION WAS PERFORMED  Sven P Anatalia Kronk 03/19/2024, 9:11 AM     "

## 2024-03-19 NOTE — Progress Notes (Signed)
 Physical Therapy Weekly Progress Note  Patient Details  Name: Tracy Baker MRN: [id number] Date of Birth: [birth year]  Beginning of progress report period: March 11, 2024 End of progress report period: March 19, 2024  Patient has met 3 of 3 short term goals. Pt demonstrates slow progress towards long term goals. Pt is currently able to perform bed mobility with supervision/min A, sit<>stands with RW and CGA/min A, stand<>pivot transfers with RW and CGA, ambulate up to 57ft with RW and CGA, and navigate 4 6in steps with L handrail and min A. Pt remains limited by fatigue, weakness, deconditioning, intermittent dizziness and pain in feet, decreased adherence to sternal precautions, and decreased balance/coordination. Pt would benefit from hands on family education training and/or continued therapy services in less intense setting (<3 hours/day) to address current impairments.   Patient continues to demonstrate the following deficits muscle weakness, decreased cardiorespiratoy endurance, and decreased standing balance, decreased postural control, decreased balance strategies, and difficulty maintaining precautions and therefore will continue to benefit from skilled PT intervention to increase functional independence with mobility.  Patient progressing toward long term goals..  Continue plan of care.  PT Short Term Goals Week 1:  PT Short Term Goal 1 (Week 1): pt will perform bed mobility with min A consistantly PT Short Term Goal 1 - Progress (Week 1): Met PT Short Term Goal 2 (Week 1): pt will perform bed<>chair transfers with LRAD and CGA PT Short Term Goal 2 - Progress (Week 1): Met PT Short Term Goal 3 (Week 1): pt will ambulate 45ft with LRAD and min A PT Short Term Goal 3 - Progress (Week 1): Met Week 2:  PT Short Term Goal 1 (Week 2): STG=LTG due to LOS  Skilled Therapeutic Interventions/Progress Updates:  Ambulation/gait training;Discharge planning;Functional mobility  training;Therapeutic Activities;Psychosocial support;Visual/perceptual remediation/compensation;Balance/vestibular training;Disease management/prevention;Neuromuscular re-education;Skin care/wound management;Therapeutic Exercise;Wheelchair propulsion/positioning;Cognitive remediation/compensation;DME/adaptive equipment instruction;Pain management;Splinting/orthotics;UE/LE Strength taining/ROM;Community reintegration;Patient/family education;Stair training;UE/LE Coordination activities   Therapy Documentation Precautions:  Precautions Precautions: Fall, Sternal Recall of Precautions/Restrictions: Impaired Precaution/Restrictions Comments: cortak Restrictions Weight Bearing Restrictions Per Provider Order: No Other Position/Activity Restrictions: Cardiac sternal precautions  Therapy/Group: Individual Therapy [name] [name] PT, DPT 03/19/2024, 7:26 AM

## 2024-03-19 NOTE — Progress Notes (Signed)
 Nutrition Follow Up  DOCUMENTATION CODES:   Severe malnutrition in context of chronic illness  INTERVENTION:  Discontinue Glucerna shakes; pt does not like supplements and requested them to be discontinued  Encouraged improvements in PO intake now that tube is removed and throat pain should improve Can add snacks BID at pt request (pt currently does not want snacks) Added double protein portions to meal trays  Encouraged family to bring in outside food, smoothies, shakes to help pt's intake If family brings in strawberry syrup, pt can request vanilla Ensure shake from floor stock to drink but otherwise does not want them ordered as part of regimen  Continue Magic cup TID with meals, each supplement provides 290 kcal and 9 grams of protein  Pt refuses most ONS (RD team has previously tried Ensure, [employer], [employer], Ju[employer] and [employer])  NUTRITION DIAGNOSIS:   Severe Malnutrition related to chronic illness as evidenced by moderate muscle depletion, severe muscle depletion, moderate fat depletion. Remains applicable  GOAL:   Patient will meet greater than or equal to 90% of their needs Progressing  MONITOR:   PO intake, Supplement acceptance, Labs  REASON FOR ASSESSMENT:   Consult Assessment of nutrition requirement/status, Enteral/tube feeding initiation and management, Poor PO  ASSESSMENT:   69 yo female admitted with hx of Calbicans candidemia, nephrolithiasis s/p ureteral stent and lithotripsy with recent admission for MV endocarditis requiring MVR, post-op course complicated by shock post-op, junctional bradycardia, afib with RVR, and pneumonia requiring ECMO. Hospital course complicated by poor PO intake requiring Cortrak placement, AKI requirining CRRT then transitioned to HD, and fatigue. Admitted to CIR to work on debility.  Spoke with pt who was resting in bed between therapy sessions. Pt reports morning therapy went well although she is  tired. Pt reports appetite has definitely improved now that throat is not so sore. Diet documentation shows pt eating on average 88% of meals which is significant improvement since last week. Praised pt for continued efforts to work on intake. Pt still does not like ONS and requests Glucerna shakes be discontinued. Discussed adding double protein portions to trays to help since pt does not like ONS and encouraged continued adequate intake of meals.   Pt reports dialysis treatments going well, states she is not having any toleration issues but overall does not like the treatments. Encouraged pt to focus on the things she can control and celebrate the progress she has made.   Will continue to monitor intake as pt nears discharge. Pt has overall made improvements in participation in therapy.   Average Meal Completion: 1/6-1/8: 31% average intake x 4 recorded meals 1/10-1/13: 88% average intake x 8 recorded meals  Nutritionally Relevant Medications: Vitamin C  500 mg daily SSI 0-9 units QID Megace  BID Midodrine  TID Remeron  daily Protonix  Zoloft    Labs reviewed: CBG x 24 h: 88-165 mg/dL BUN 53<--27 Cr 5.64<--7.31 Phos 4.7  A1c 5.4  Admit weight: 62 kg Current weight: 62.1 kg  HD Plan:  MWF schedule: 3.5h, no heparin . TDC.  Last HD: 1/12 UF removed: no UF goal Next HD: 1/14 Has chair schedule outpatient TTS 12:30pm starting 03/27/24   Diet Order:   Diet Order             Diet regular Room service appropriate? Yes; Fluid consistency: Thin  Diet effective now                   EDUCATION NEEDS:   Education needs have been addressed  Skin:  Skin Integrity Issues:: Stage II Stage II: sacrum Incisions: chest  Last BM:  1/13 type 6  Height:   Ht Readings from Last 1 Encounters:  03/10/24 5' 4 (1.626 m)    Weight:   Wt Readings from Last 1 Encounters:  03/19/24 62.1 kg    Ideal Body Weight:  54.55 kg  BMI:  Body mass index is 23.5 kg/m.  Estimated  Nutritional Needs:   Kcal:  1900-2100  Protein:  85-105g  Fluid:  2L    Josette Glance, MS, RDN, LDN Clinical Dietitian I Please reach out via secure chat

## 2024-03-19 NOTE — Progress Notes (Signed)
 Physical Therapy Session Note  Patient Details  Name: Tracy Baker MRN: [id number] Date of Birth: 1955-03-10  Today's Date: 03/19/2024 PT Individual Time: 9268-9158 and 8954-8942 PT Individual Time Calculation (min): 70 min and 12 min PT Missed Time: 33 minutes PT Missed Time Reason: Fatigue  Short Term Goals: Week 1:  PT Short Term Goal 1 (Week 1): pt will perform bed mobility with min A consistantly PT Short Term Goal 1 - Progress (Week 1): Met PT Short Term Goal 2 (Week 1): pt will perform bed<>chair transfers with LRAD and CGA PT Short Term Goal 2 - Progress (Week 1): Met PT Short Term Goal 3 (Week 1): pt will ambulate 74ft with LRAD and min A PT Short Term Goal 3 - Progress (Week 1): Met  Skilled Therapeutic Interventions/Progress Updates:  Treatment Session 1 Received pt semi-reclined in bed, pt agreeable to PT treatment, and denied any pain but reported fatigue. Session with emphasis on OOB tolerance, functional mobility/transfers, toileting, generalized strengthening and endurance, dynamic standing balance/coordination, ambulation, and stair navigation. Pt transferred semi-reclined<>sitting R EOB with HOB elevated and use of bedrails with supervision (pt pulling on bedrail minimally despite cues). Required ++ time sitting EOB, then  reported urge to void - with encouragement, pt ambulated into bathroom with RW and CGA/min A (~80ft). Pt able to manage clothing with min A for balance and able to void. Performed hygiene management without assist, then sat in WC at sink and washed hands/brushed teeth with setup assist.    Pt performed remainder of transfers with RW and CGA/min A throughout session. Pt transported to/from room in [facility] dependently for time management purposes. Pt reports having 5-6 STE with L handrail. Stood at staircase with min A and navigated 4 6in steps with L handrail and min A using a lateral stepping technique. Recommended ambulating to staircase and setting RW aside,  then having someone bring RW to top of steps so that it's ready for her when she gets to the top. Discussed recommendation for 24/7 assist upon D/C, but pt reported not being sure if her sister will be able to stay with her. Suggested staying at daughter's house with no steps but pt declined and prefers to stay at her home. Discussed recommendation for RW and WC at discharge and getting handicap parking pass - CSW notified and provided pt with printout of RW from [employer].   Pt then ambulated 86ft with RW and CGA fading to min A - limited by 8/10 fatigue. Pt expressed concern about being able to walk her dog and pick up after her - suggested having daughter watch her dog initially. Pt then performed the following seated exercises with emphasis on LE strength/ROM: -knee extensions 2x10 bilaterally with 0.5lb ankle weight -hip flexion 2x10 bilaterally with 0.5lb ankle weight -hip abduction with yellow TB 2x15  Returned to room and pt requested to return to bed. Transitioned into supine with supervision and concluded session with pt semi-reclined in bed with all needs within reach.   Treatment Session 2 Received pt sitting in WC reporting fatigue. Pt required multiple attempts and min A to stand from [facility] due to fatigue and ambulated 84ft x 2 trials with RW and CGA - limited by fatigue. Pt reported 9/10 fatigue and requested to return to bed. Transitioned into supine with supervision and concluded session with pt semi-reclined in bed with all needs within reach. 33 minutes missed of skilled physical therapy due to fatigue.   Therapy Documentation Precautions:  Precautions Precautions: Fall, Sternal  Recall of Precautions/Restrictions: Impaired Precaution/Restrictions Comments: cortak Restrictions Weight Bearing Restrictions Per Provider Order: No Other Position/Activity Restrictions: Cardiac sternal precautions   Therapy/Group: Individual Therapy [name] [name] PT, DPT 03/19/2024,  8:42 AM

## 2024-03-19 NOTE — Progress Notes (Signed)
 Occupational Therapy Session Note  Patient Details  Name: Tracy Baker MRN: [id number] Date of Birth: 17-May-1955  Today's Date: 03/19/2024 OT Individual Time: 0950-1030 OT Individual Time Calculation (min): 40 min    Short Term Goals: Week 2:  OT Short Term Goal 1 (Week 2): STG = LTG due to ELOS  Skilled Therapeutic Interventions/Progress Updates:  Skilled OT intervention completed with focus on functional endurance and ADL retraining. Pt received upright in bed, agreeable to session. No pain reported.  Pt required assist to call lunch but showed improvements with initiating meals and selecting her own items. Transitioned EOB with supervision however cueing needed for sternal precautions. Pt had initial dizziness upon sitting but improved with prolonged sitting. Donned abdominal binder for hemodynamic support with mobility as pt didn't prefer TEDs.   Nephrology MD present for rounds, requesting pt's urine output be measured. Nursing notified and OT retrieved hat for commode due to pt need to void. Completed CGA sit > stand with RW, then CGA/min A ambulatory transfer from EOB > BSC but with generalized weakness through BLE and trunk with pt uncoordinated requiring cues for safety throughout. Continent of urinary void. Min A for toileting steps for pants management only. Ambulated with Min A back to w/c with fatigue expressed but denied dizziness.   Set up A for hand hygiene and oral care. NT present to assess pt's weight. Pt remained seated in w/c, direct care hand off to NT.  Therapy Documentation Precautions:  Precautions Precautions: Fall, Sternal Recall of Precautions/Restrictions: Impaired Precaution/Restrictions Comments: cortak Restrictions Weight Bearing Restrictions Per Provider Order: No Other Position/Activity Restrictions: Cardiac sternal precautions    Therapy/Group: Individual Therapy  [name], MS, OTR/L  03/19/2024, 10:47 AM

## 2024-03-19 NOTE — Progress Notes (Signed)
 " [facility] Progress Note    Assessment/ Plan:   Dialysis dependent AKI, initiated 02/16/2024: Creatinine normal at baseline Currently on MWF schedule: 3.75h, no heparin . TDC.  Following UOP and labs and intradialytic window for any suggestive GFR recovery, none to date; asked CIR staff to try to capture all UOP Currently using Left IJ TDC placed on 12/22 with IR.   On midodrine  10 TID- did better with BP Renal navigator following for clip, likely outpatient to [facility] kidney center.   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 On antifungal therapy  Anemia -transfuse PRN for Hgb <7; Hb 10s  Hyponatremia: Resolved  Will follow.    Subjective:    No new issues Creatinine increased from 2.9-4.3 in past 24 hours, consistent with very low GFR UOP not being quantified, discussed with staff   Objective:   BP 102/74 (BP Location: Left Arm)   Pulse 65   Temp 97.8 F (36.6 C)   Resp 17   Ht 5' 4 (1.626 m)   Wt 62.1 kg   SpO2 99%   BMI 23.50 kg/m  No intake or output data in the 24 hours ending 03/19/24 1135   Weight change:   Physical Exam: Gen:  NAD, working with therapy CVS: normal rate, no rub Resp: bl chest rise, normal wob Abd: soft Ext: No significant edema noted Dialysis access:IJ tunneled  HD cath  Imaging: No results found.       Labs: BMET Recent Labs  Lab 03/12/24 1142 03/12/24 1530 03/13/24 1027 03/14/24 1400 03/15/24 0601 03/18/24 0517 03/19/24 0456  NA 127* 128* 130* 132* 131* 136 138  K 4.9 4.8 4.5 4.7 4.1 4.2 4.4  CL 87* 89* 91* 93* 92* 97* 98  CO2 21* 20* 23 21* 26 28 25   GLUCOSE 146* 118* 137* 114* 105* 99 94  BUN 121* 123* 72* 104* 35* 29* 46*  CREATININE 3.94* 3.94* 2.68* 3.90* 2.24* 2.93* 4.35*  CALCIUM  10.0 9.8 10.0 9.7 9.5 9.1 9.5  PHOS  --  5.7*  --   --   --   --  4.7*   CBC Recent Labs  Lab 03/12/24 1142 03/12/24 1530 03/13/24 1027  03/14/24 1224 03/17/24 0622  WBC 10.3 9.5 10.9* 10.0 8.7  NEUTROABS 8.3*  --  8.6* 8.0* 6.0  HGB 12.0 10.3* 12.4 12.3 10.3*  HCT 35.7* 31.0* 37.2 36.5 30.7*  MCV 95.2 98.4 95.6 95.1 96.2  PLT 158 134* 193 163 157    Medications:     amiodarone   200 mg Oral Daily   apixaban   5 mg Oral BID   ascorbic acid   500 mg Oral Q supper   [START ON 03/20/2024] cholecalciferol   2,000 Units Oral Daily   fluconazole   400 mg Oral Q supper   Followed by   NOREEN ON 05/05/2024] fluconazole   200 mg Oral Daily   Gerhardt's butt cream   Topical BID   hydrocerin   Topical BID   insulin  aspart  0-9 Units Subcutaneous TID AC & HS   megestrol   80 mg Oral BID   melatonin  3 mg Oral QHS   midodrine   10 mg Oral TID WC   mirtazapine   30 mg Oral QHS   mouth rinse  15 mL Mouth Rinse 4 times per day   pantoprazole   40 mg Oral Daily   pregabalin   25 mg Oral QHS   sertraline   25 mg Oral QHS  sulfamethoxazole -trimethoprim   1 tablet Oral Q12H       "

## 2024-03-20 DIAGNOSIS — R5381 Other malaise: Secondary | ICD-10-CM | POA: Diagnosis not present

## 2024-03-20 LAB — RENAL FUNCTION PANEL
Albumin: 3.5 g/dL (ref 3.5–5.0)
Anion gap: 11 (ref 5–15)
BUN: 22 mg/dL (ref 8–23)
CO2: 28 mmol/L (ref 22–32)
Calcium: 9.1 mg/dL (ref 8.9–10.3)
Chloride: 97 mmol/L — ABNORMAL LOW (ref 98–111)
Creatinine, Ser: 2.55 mg/dL — ABNORMAL HIGH (ref 0.44–1.00)
GFR, Estimated: 20 mL/min — ABNORMAL LOW
Glucose, Bld: 86 mg/dL (ref 70–99)
Phosphorus: 2.8 mg/dL (ref 2.5–4.6)
Potassium: 4.2 mmol/L (ref 3.5–5.1)
Sodium: 136 mmol/L (ref 135–145)

## 2024-03-20 LAB — GLUCOSE, CAPILLARY
Glucose-Capillary: 90 mg/dL (ref 70–99)
Glucose-Capillary: 98 mg/dL (ref 70–99)
Glucose-Capillary: 129 mg/dL — ABNORMAL HIGH (ref 70–99)
Glucose-Capillary: 120 mg/dL — ABNORMAL HIGH (ref 70–99)

## 2024-03-20 LAB — COMPREHENSIVE METABOLIC PANEL WITH GFR
ALT: 22 U/L (ref 0–44)
AST: 23 U/L (ref 15–41)
Albumin: 3.6 g/dL (ref 3.5–5.0)
Alkaline Phosphatase: 148 U/L — ABNORMAL HIGH (ref 38–126)
Anion gap: 13 (ref 5–15)
BUN: 22 mg/dL (ref 8–23)
CO2: 27 mmol/L (ref 22–32)
Calcium: 9.1 mg/dL (ref 8.9–10.3)
Chloride: 96 mmol/L — ABNORMAL LOW (ref 98–111)
Creatinine, Ser: 2.53 mg/dL — ABNORMAL HIGH (ref 0.44–1.00)
GFR, Estimated: 20 mL/min — ABNORMAL LOW
Glucose, Bld: 84 mg/dL (ref 70–99)
Potassium: 4.4 mmol/L (ref 3.5–5.1)
Sodium: 136 mmol/L (ref 135–145)
Total Bilirubin: 0.6 mg/dL (ref 0.0–1.2)
Total Protein: 6.3 g/dL — ABNORMAL LOW (ref 6.5–8.1)

## 2024-03-20 LAB — MAGNESIUM: Magnesium: 1.9 mg/dL (ref 1.7–2.4)

## 2024-03-20 MED ORDER — VITAMIN D 25 MCG (1000 UNIT) PO TABS
1000.0000 [IU] | ORAL_TABLET | Freq: Every day | ORAL | Status: DC
  Administered 2024-03-20 – 2024-04-02 (×14): 1000 [IU] via ORAL
  Filled 2024-03-20 (×15): qty 1

## 2024-03-20 NOTE — Progress Notes (Signed)
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 TDC.  Following UOP and labs and intradialytic window for any suggestive GFR recovery, none to date; asked CIR staff to try to capture all UOP Currently using Left IJ TDC placed on 12/22 with IR.   On midodrine  10 TID-  better with BP Renal navigator following for clip, likely outpatient to [facility] kidney center.   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 On antifungal therapy  Anemia -transfuse PRN for Hgb <7; Hb 10s  Hyponatremia: Resolved  Will follow.    Subjective:    No new issues HD yesterday with 1 L UF UOP not being quantified despite requesting     Objective:   BP 106/62 (BP Location: Left Arm)   Pulse 77   Temp 98.4 F (36.9 C)   Resp 17   Ht 5' 4 (1.626 m)   Wt 62.9 kg   SpO2 100%   BMI 23.80 kg/m   Intake/Output Summary (Last 24 hours) at 03/20/2024 1413 Last data filed at 03/20/2024 9167 Gross per 24 hour  Intake 168 ml  Output 1000 ml  Net -832 ml     Weight change:   Physical Exam: Gen:  NAD, working with therapy CVS: normal rate, no rub Resp: bl chest rise, normal wob Abd: soft Ext: No significant edema noted Dialysis access:IJ tunneled  HD cath  Imaging: No results found.       Labs: BMET Recent Labs  Lab 03/14/24 1400 03/15/24 0601 03/18/24 0517 03/19/24 0456 03/20/24 0519  NA 132* 131* 136 138 136  136  K 4.7 4.1 4.2 4.4 4.4  4.2  CL 93* 92* 97* 98 96*  97*  CO2 21* 26 28 25 27  28   GLUCOSE 114* 105* 99 94 84  86  BUN 104* 35* 29* 46* 22  22  CREATININE 3.90* 2.24* 2.93* 4.35* 2.53*  2.55*  CALCIUM  9.7 9.5 9.1 9.5 9.1  9.1  PHOS  --   --   --  4.7* 2.8   CBC Recent Labs  Lab 03/14/24 1224 03/17/24 0622  WBC 10.0 8.7  NEUTROABS 8.0* 6.0  HGB 12.3 10.3*  HCT  36.5 30.7*  MCV 95.1 96.2  PLT 163 157    Medications:     amiodarone   200 mg Oral Daily   apixaban   5 mg Oral BID   ascorbic acid   500 mg Oral Q supper   cholecalciferol   1,000 Units Oral Daily   fluconazole   400 mg Oral Q supper   Followed by   NOREEN ON 05/05/2024] fluconazole   200 mg Oral Daily   Gerhardt's butt cream   Topical BID   hydrocerin   Topical BID   insulin  aspart  0-9 Units Subcutaneous TID AC & HS   megestrol   80 mg Oral BID   melatonin  3 mg Oral QHS   midodrine   10 mg Oral TID WC   mirtazapine   30 mg Oral QHS   mouth rinse  15 mL Mouth Rinse 4 times per day   pantoprazole   40 mg Oral Daily   sertraline   25 mg Oral QHS   sulfamethoxazole -trimethoprim   1 tablet Oral Q12H       "

## 2024-03-20 NOTE — Patient Care Conference (Signed)
 Inpatient RehabilitationTeam Conference and Plan of Care Update Date: 03/19/2024   Time: 11:40 AM    Patient Name: Tracy Baker      Medical Record Number: [id number]  Date of Birth: 30-Mar-1955 Sex: Female         Room/Bed: 4M04C/4M04C-01 Payor Info: Payor: [employer] / Plan: [employer] MEDICARE / Product Type: *No Product type* /    Admit Date/Time:  03/10/2024  8:51 PM  Primary Diagnosis:  Debility  Hospital Problems: Principal Problem:   Debility    Expected Discharge Date: Expected Discharge Date:  (SNF pending)  Team Members Present: Physician leading conference: Dr. Sven Elks Social Worker Present: [name], LCSW-A Nurse Present: Barnie Ronde, RN PT Present: [name], PT OT Present: Lorrayne Fritter, OT SLP Present: Rosina Downy, SLP     Current Status/Progress Goal Weekly Team Focus  Bowel/Bladder      UTI on abx; HD, T,H,Sa; oliguric Constipation addressed    Bowel and bladder managed with min assist    Monitor need for laxatives and assist with toileting  Swallow/Nutrition/ Hydration               ADL's   Min A UB, LB, and mod A toileting   Supervision   functional endurance, sternal precaution adherence, activity tolerance, awareness    Mobility   bed mobility min A, transfers with RW CGA/min A, ambulation 31ft with RW CGA/min A   supervision  barriers: intermittent pain in feet, dizziness, fatigue from HD, low BP, global weakness/deconditioning, adherance to sternal precautions, decreased OOB tolerance    Communication                Safety/Cognition/ Behavioral Observations               Pain      Neuropathy, pain in feet          Skin      Wound on sacrum, DTI on left heel   Wounds healing and no new skin integrity issues noted with min assist   WOC consulted Prevalon boots in use, foam to heel, dressing ordered for sacrum wound, air mattress, nutritional supplements     Discharge Planning:  Home vs  SNF. Daughter, [name] to plan for family education per previous conversation. HD set up at [facility] on 03/27/24, for a TTS, 1230 pm chair time. Awaiting therapy follow-up recommendations.   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 Patient with poor appetite on TF via cortrak with appetite stimulant. Functional progress limited by foot pain, defuse strength issues, fatigue and decreased initiation and lack of motivation; presenting similar to FTT with anoxic brain injury. Progress limited by dizziness and fatigue with poor adherence to sternal precautions.  Patient on target to meet rehab goals: no, currently patient needs min assist for upper body care and mod assist for toileting. Needs CGA - min assist for transfers and able to ambulate up to 45' with CGA and min assist to navigate steps using a rail.  24/7 care recommended at discharge  *See Care Plan and progress notes for long and short-term goals.   Revisions to Treatment Plan:  Set up for [facility] HD:T, H, Sa  MD added Vitamin B 12 supplement and Vitamin D    Teaching Needs: Safety, medications, dietary modifications, skin care/wound care, transfers, toileting, etc.   Current Barriers to Discharge: Decreased caregiver support, Home enviroment access/layout, and Hemodialysis  Possible Resolutions to Barriers:  Family education Has hospital bed and chair lift at home but limited assistance as dtr. working. HH follow up services     Medical Summary Current Status: fatigue, ESRD, s/p MVR, depression, UTI, decreased appetite, insomnia, fungal infection, hypotension  Barriers to Discharge: Medical stability  Barriers to Discharge Comments: fatigue, ESRD, s/p MVR, depression, UTI, decreased appetite, insomnia, fungal infection, hypotension Possible Resolutions to [employer] Focus: d/c lyrica , incresae vitamin D  supplement, continue HD, continue zoloft , continue  Bactrim , continue megace  and remeron , continue melatonin, continue fluconazole , continue midodrine    Continued Need for Acute Rehabilitation Level of Care: The patient requires daily medical management by a physician with specialized training in physical medicine and rehabilitation for the following reasons: Direction of a multidisciplinary physical rehabilitation program to maximize functional independence : Yes Medical management of patient stability for increased activity during participation in an intensive rehabilitation regime.: Yes Analysis of laboratory values and/or radiology reports with any subsequent need for medication adjustment and/or medical intervention. : Yes   I attest that I was present, lead the team conference, and concur with the assessment and plan of the team.   Fredericka Sober B 03/20/2024, 9:08 AM

## 2024-03-20 NOTE — Plan of Care (Signed)
" °  Problem: Consults Goal: RH GENERAL PATIENT EDUCATION Description: See Patient Education module for education specifics. Outcome: Progressing Goal: Skin Care Protocol Initiated - if Braden Score 18 or less Description: If consults are not indicated, leave blank or document N/A Outcome: Progressing Goal: Nutrition Consult-if indicated Outcome: Progressing Goal: Diabetes Guidelines if Diabetic/Glucose > 140 Description: If diabetic or lab glucose is > 140 mg/dl - Initiate Diabetes/Hyperglycemia Guidelines & Document Interventions  Outcome: Progressing   Problem: RH BOWEL ELIMINATION Goal: RH STG MANAGE BOWEL WITH ASSISTANCE Description: STG Manage Bowel with mod I  Assistance. Outcome: Progressing Goal: RH STG MANAGE BOWEL W/MEDICATION W/ASSISTANCE Description: STG Manage Bowel with Medication with mod I Assistance. Outcome: Progressing   Problem: RH SKIN INTEGRITY Goal: RH STG SKIN FREE OF INFECTION/BREAKDOWN Description: Manage skin w min assist Outcome: Progressing Goal: RH STG MAINTAIN SKIN INTEGRITY WITH ASSISTANCE Description: STG Maintain Skin Integrity With Assistance. Outcome: Progressing Goal: RH STG ABLE TO PERFORM INCISION/WOUND CARE W/ASSISTANCE Description: STG Able To Perform Incision/Wound Care With min Assistance. Outcome: Progressing   Problem: RH SAFETY Goal: RH STG ADHERE TO SAFETY PRECAUTIONS W/ASSISTANCE/DEVICE Description: STG Adhere to Safety Precautions With cues Assistance/Device. Outcome: Progressing   Problem: RH PAIN MANAGEMENT Goal: RH STG PAIN MANAGED AT OR BELOW PT'S PAIN GOAL Description: Pain < 4 with prns Outcome: Progressing   Problem: RH KNOWLEDGE DEFICIT GENERAL Goal: RH STG INCREASE KNOWLEDGE OF SELF CARE AFTER HOSPITALIZATION Description: Patient and family will be able to manage care at discharge using educational resources for medication, skin care and dietary modification independently Outcome: Progressing   "

## 2024-03-20 NOTE — Progress Notes (Signed)
 Patient ID: Tracy Baker, female   DOB: 1955-06-16, 69 y.o.   MRN: [id number]  Have reviewed team conference with pt and family. Both aware and agreeable with targeted d/c date pending of SNF.   SW will send referrals to preference of [facility], [facility] and other SNF's per family request.

## 2024-03-20 NOTE — Progress Notes (Signed)
 "                                                        PROGRESS NOTE   Subjective/Complaints: Complains of bilateral hand tremors, added liver enzymes to today's labs Discussed d/c to SNF, she would prefer SNF in [place]  ROS:  Pt denies SOB, abd pain, CP, N/V/C/D, and vision changes +fatigue  Objective:   No results found.  No results for input(s): WBC, HGB, HCT, PLT in the last 72 hours.  Recent Labs    03/19/24 0456 03/20/24 0519  NA 138 136  136  K 4.4 4.4  4.2  CL 98 96*  97*  CO2 25 27  28   GLUCOSE 94 84  86  BUN 46* 22  22  CREATININE 4.35* 2.53*  2.55*  CALCIUM  9.5 9.1  9.1    Intake/Output Summary (Last 24 hours) at 03/20/2024 1025 Last data filed at 03/20/2024 9167 Gross per 24 hour  Intake 168 ml  Output 1000 ml  Net -832 ml      Wound 03/10/24 2051 Pressure Injury Sacrum Right Stage 2 -  Partial thickness loss of dermis presenting as a shallow open injury with a red, pink wound bed without slough. (Active)     Wound 03/15/24 1507 Pressure Injury Foot Left;Posterior Deep Tissue Pressure Injury - Purple or maroon localized area of discolored intact skin or blood-filled blister due to damage of underlying soft tissue from pressure and/or shear. (Active)    Physical Exam: Vital Signs Blood pressure 106/62, pulse 77, temperature 98.4 F (36.9 C), resp. rate 17, height 5' 4 (1.626 m), weight 62.9 kg, SpO2 100%.  General: awake, alert, appropriate, NAD, laying in bed appears comfortable; vitals reviewed HENT: conjugate gaze; oropharynx dry CV: regular rate and rhythm currently; no JVD Pulmonary: CTA B/L; nonlabored GI: soft, NT, ND, (+)BS normoactive Extr: No LE edema Psychiatric: appropriate, cooperative Neurological: Ox3  Skin: has chest wall incision, tunneled catheter on left chest wall, right buttock wound and bilateral heel wounds--> left heel now with quarter sized stage II- not examined today 1/14  Neuro: Alert,4-/5  strength throughout, good attention, sensation is intact, +bilateral hand tremors   Assessment/Plan: 1. Functional deficits which require 3+ hours per day of interdisciplinary therapy in a comprehensive inpatient rehab setting. Physiatrist is providing close team supervision and 24 hour management of active medical problems listed below. Physiatrist and rehab team continue to assess barriers to discharge/monitor patient progress toward functional and medical goals  Care Tool:  Bathing    Body parts bathed by patient: Right arm, Left arm, Chest, Abdomen, Front perineal area, Face   Body parts bathed by helper: Buttocks, Right upper leg, Left upper leg, Right lower leg, Left lower leg     Bathing assist Assist Level: Moderate Assistance - Patient 50 - 74%     Upper Body Dressing/Undressing Upper body dressing   What is the patient wearing?: Pull over shirt    Upper body assist Assist Level: Maximal Assistance - Patient 25 - 49%    Lower Body Dressing/Undressing Lower body dressing      What is the patient wearing?: Incontinence brief, Pants     Lower body assist Assist for lower body dressing: Total Assistance - Patient < 25%     Toileting Toileting  Toileting assist Assist for toileting: Minimal Assistance - Patient > 75%     Transfers Chair/bed transfer  Transfers assist  Chair/bed transfer activity did not occur: Safety/medical concerns (pain, fatigue, dizziness)  Chair/bed transfer assist level: Contact Guard/Touching assist     Locomotion Ambulation   Ambulation assist   Ambulation activity did not occur: Safety/medical concerns (pain, fatigue, dizziness)  Assist level: Contact Guard/Touching assist Assistive device: Walker-rolling Max distance: 36ft   Walk 10 feet activity   Assist  Walk 10 feet activity did not occur: Safety/medical concerns (pain, fatigue, dizziness)  Assist level: Minimal Assistance - Patient > 75% Assistive device:  Walker-rolling   Walk 50 feet activity   Assist Walk 50 feet with 2 turns activity did not occur: Safety/medical concerns (pain, fatigue, dizziness)         Walk 150 feet activity   Assist Walk 150 feet activity did not occur: Safety/medical concerns (pain, fatigue, dizziness)         Walk 10 feet on uneven surface  activity   Assist Walk 10 feet on uneven surfaces activity did not occur: Safety/medical concerns (pain, fatigue, dizziness)         Wheelchair     Assist Is the patient using a wheelchair?: Yes Type of Wheelchair: Manual Wheelchair activity did not occur: Safety/medical concerns (pain, fatigue, dizziness)         Wheelchair 50 feet with 2 turns activity    Assist    Wheelchair 50 feet with 2 turns activity did not occur: Safety/medical concerns (pain, fatigue, dizziness)       Wheelchair 150 feet activity     Assist  Wheelchair 150 feet activity did not occur: Safety/medical concerns (pain, fatigue, dizziness)       Blood pressure 106/62, pulse 77, temperature 98.4 F (36.9 C), resp. rate 17, height 5' 4 (1.626 m), weight 62.9 kg, SpO2 100%.  Medical Problem List and Plan: 1. Functional deficits secondary to debility after prolonged hospital stay related to mitral valve vegetations and MV repair and ultimate replacement.             -patient may  shower             -ELOS/Goals: 15 days, supervision goals with PT, OT, SLP  Con't CIR PT, OT and SLP Continue Eliquis  Will call son to update him today Full code but no chest compressions -Message from SW- daughter requesting call-daughter would prefer patient stay longer than current discharge date-Will defer decision until team conference tomorrow and Dr. Lorilee will be there tomorrow.  After team meeting daughter requests somebody call her to let her know regarding decision, if she does not pick up she would like message to be left and then she can call back. Discussed SNF  placement  2. Hypotension: medications reviewed and is on amiodarone , d/c clonazepam , continue compression garments, continue midodrine  10mg  TID, continue abdominal binder  3. Pain: A) Bilateral heel pain: d/ced Lyrica  because this pain improves B) Right ankle pain: ASO ordered, placed nursing for heat application in the morning for stiffness and ice in the afternoon to reduce inflammation. XR reviewed and is stable  4. Insomnia: continue Remeron , scheduled melatonin        5. Neuropsych/cognition: This patient is capable of making decisions on her own behalf.  6. Skin/Wound Care: Air mattress overlay for pressure relief.             --continue Ensure and prosource             --  added Vitamin C  and Zinc to promote wound healing.   1/11 left heel appears to be evolution of what was present on admit   -continue above, prevalons   -discussed with pt importance of pressure relief, nutrition  7. Decreased appetite: discussed with patient that she feels this is improving, will d/c cortrak, discussed that family is bringing foods from home NO FR needed per  nephrology.    -appetite/intake is picking up.  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             --F/u with ID 01/06?-->discussed with Dr. Overton who will follow up with for input.   9. 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   10. AKI with dialysis initiated 02/16/24: On HD MWF at the end of the day to help with tolerance of therapy             Cr reviewed and is improving             --CLIP on hold. Needs to be able to sit up in a chair for HD.    11. A fib: Monitor HR TID--continue amiodarone  and eliquis .   12. Anemia of critical illness:Being monitored and stable.              --thrombocytopenia has resolved.   13. Urinary urgency: UA ordered 1/9, PVRs ordered, f/u UA  1/11 UA +, need UCX   -begin empiric bactrim  ss bid  1/12- Pt's upset has UTI-  why do I have one at all- and thought she was already on ABX to prevent UTI- explained she was on antifungals, not ABX currently.  -1/13 urine culture with Enterobacter cloaca sensitive to Bactrim , continue current regimen, will continue for 3 additional days  14. Daytime fatigue:  -continue D3- increase to 2,000U daily B12 ordered (one time injection given red dye allergy), d/c seroquel , change lyrica  to HS, decreased therapy to 15/7, d/c clonazepam  at night, discussed that remeron  could contribute but will maintain for now given insomnia and to help stimulate appetite, melatonin scheduled at night to help with insomnia  15. Hyponatremia: Na level reviewed and this improved, continue to monitor Na levels with HD    LOS: 10 days A FACE TO FACE EVALUATION WAS PERFORMED  Sven P Moranda Billiot 03/20/2024, 10:25 AM     "

## 2024-03-20 NOTE — Progress Notes (Signed)
 Physical Therapy Session Note  Patient Details  Name: Tracy Baker MRN: [id number] Date of Birth: [birth year]  Today's Date: 03/20/2024 PT Individual Time: 0930-1026 PT Individual Time Calculation (min): 56 min   Short Term Goals: Week 1:  PT Short Term Goal 1 (Week 1): pt will perform bed mobility with min A consistantly PT Short Term Goal 1 - Progress (Week 1): Met PT Short Term Goal 2 (Week 1): pt will perform bed<>chair transfers with LRAD and CGA PT Short Term Goal 2 - Progress (Week 1): Met PT Short Term Goal 3 (Week 1): pt will ambulate 28ft with LRAD and min A PT Short Term Goal 3 - Progress (Week 1): Met Week 2:  PT Short Term Goal 1 (Week 2): STG=LTG due to LOS  Skilled Therapeutic Interventions/Progress Updates:   Pt seen for co-treatment with recreational therapy. Received pt semi-reclined in bed, pt agreeable to PT treatment and denied any pain during session. Session with emphasis on functional mobility/transfers, generalized strengthening and endurance, dynamic standing balance/coordination, community navigation, and ambulation. Noted WC missing - provided pt with new 16x16 WC with Roho cushion. Pt transferred semi-reclined<>sitting EOB with HOB elevated and use of bedrails with supervision with improved adherence to sternal precautions.  Pt performed all transfers with RW and CGA throughout session. Pt transported downstairs to atrium in WC dependently. Pt ambulated ~55ft x 2 trials with RW and CGA around giftshop with WC follow to simulate navigating tight spaces in home environment - limited by fatigue. Pt much more engaged in conversation, brighter, and in better spirits today. Discussed pt's hobbies, reading and sewing and family trips that pt enjoys going on with her family during rest breaks. Pushed pt around [employer] dependently and looked a protein drinks (pt reported finding one that she really likes with high nutrition content). Returned to dayroom and performed seated  hip flexion and knee extension 2x20 bilaterally with 0.25lb ankle weights with emphasis on LE strength. Returned to room and concluded session with pt sitting in [facility] with all needs within reach awaiting upcoming OT session.    Therapy Documentation Precautions:  Precautions Precautions: Fall, Sternal Recall of Precautions/Restrictions: Impaired Precaution/Restrictions Comments: cortak Restrictions Weight Bearing Restrictions Per Provider Order: No Other Position/Activity Restrictions: Cardiac sternal precautions  Therapy/Group: Individual Therapy [name] [name] PT, DPT 03/20/2024, 7:01 AM

## 2024-03-20 NOTE — Plan of Care (Signed)
"        Wound Plan    Wounds present:   DTI left heel Stage 2 right sacrum  Interventions:  Offload heel pressure with prevalon boots.    Dressing procedure/placement/frequency: Continue Xeroform and foam.   Turn and reposition.   Air mattress bed Air redistribution cushion in wheelchair Vitamin C  daily Vitamin D  daily Diflucan  daily  Eucerin crean BID bil LE skin Megace  BID  03/14/24: Supplemental TF via cortrak discontinued; now on  Regular diet; eating > 50% of meals with  Pt refuses most ONS (RD team has previously tried Ensure, [employer], [employer], [employer] and [employer])  supplements and protein shakes from outside facility Can add snacks BID at pt request (pt currently does not want snacks)  Magic cup TID with meals  03/17/24; Reinforced importance of prevalon boots/off loading  03/18/24 Changed to Choraprep and foam  to left heel  03/19/24; dietician added double protein portions to meal trays  Braden Score: 13 Sensory: 3 Moisture: 3 Activity: 3 Mobility: 3 Nutrition: 2 Friction: 2  Contributors: Kreg Dan, RN, CRRN, WTA Barnie Ronde, RN-BC, CRRN Darice Cooley MSN, RN, FNP-BC CWON  Josette Glance, MS, RDN, LDN Clinical Dietitian I "

## 2024-03-20 NOTE — Progress Notes (Signed)
 Occupational Therapy Session Note  Patient Details  Name: Tracy Baker MRN: [id number] Date of Birth: 05-19-1955  Session 1 Today's Date: 03/20/2024 OT Individual Time: 1031-1100 OT Individual Time Calculation (min): 29 min  Session 2 Today's Date: 03/20/2024 OT Individual Time: 8653-8552 OT Individual Time Calculation (min): 61 min    Short Term Goals: Week 2:  OT Short Term Goal 1 (Week 2): STG = LTG due to ELOS  Skilled Therapeutic Interventions/Progress Updates:   Session 1: Skilled OT session completed to address ADL retraining. Pt received seated in WC, agreeable to participate in therapy. Pt reports no pain.   Pt attempts to self-propel WC for self care, OT prompted to discontinue d/t sternal precautions. OT assisted with propelled to sink. Pt completed oral care and face washing with set-up A to retrieve wash cloths. Pt requested to void and directing care to complete functional mobilty with RW remainder of session. OT provided RW, pt completed functional mobility to bathroom with CGA for safe RW mgmt when turning to sit at toilet. Pt required increased time for toileting, continent of bowel and bladder. Toileting hygiene completed with Min A to manage incontinence brief. OT provided education of use of disposable underwear vs incontinence brief to increase independence with toileting. Pt verbalized understanding however, respectfully decliner d/t hygiene with foam dressing on bottom. Pt requesting A to manage foam dressing. Pt completed functional mobility to EOB with CGA. EOB>Supine with supervision. OT replaced foam dressing at bottom for hygiene mgmt d/t being soiled with bowel, LPN notified of need to reapply dressing. Pt supine in bed direct handoff to LPN with all needs in reach.   Session 2: Skilled OT session completed to address ADL retraining, functional mobility, and global endurance for functional activity. Pt received supine in bed, agreeable to participate in therapy.  Pt reports no pain.  Pt requested to void, supine>EOB with supervision, VC for adhere to sternal precautions. OT prompted pt to recall sternal precautions, pt able to recall all sternal precautions with no A; however, pt requires cues throughout session for adherence. EOB>Toilet supervision with RW. Pt completed toileting hygiene with Min A for clothing mgmt when pulling up pants standing at RW. Pt completed functional mobility to sink with RW for hand hygiene. Pt reporting dizziness, OT provided WC to sit. BP assessed: 138/52. TED hose currently donned. Once seated, pt endorsing dizziness subsides. Pt dependently propelled to main gym.   Pt completed functional mobility activity to navigate R/L turns needed for toileting to increase RW safety and mgmt. Pt required supervision throughout task for sequencing when turning R, no difficulty displayed when turning L. Pt completed dynamic standing balance activity standing at RW tossing ball at angled trampoline targeting anterior and lateral weight shifting to increase independence with ADLs and overall activity tolerance. Trial 1: standing 42 sec. Trial 2: 1:10 sec, Trial 3: 1:05 sec. Pt required seated rest breaks between each trial. Pt dependently propelled back to room. WC>EOB stand pivot with RW CGA for RW mgmt. EOB>Supine supervision, VC to reduce use of bed rails for sternal precaution adherence. OT dependently removed TED hoes. Pt supine in bed with son present and all needs in reach.      Therapy Documentation Precautions:  Precautions Precautions: Fall, Sternal Recall of Precautions/Restrictions: Impaired Precaution/Restrictions Comments: cortak Restrictions Weight Bearing Restrictions Per Provider Order: No Other Position/Activity Restrictions: Cardiac sternal precautions   Therapy/Group: Individual Therapy  Kamyah Wilhelmsen Woods-Chance, MS, OTR/L 03/20/2024, 7:49 AM

## 2024-03-21 ENCOUNTER — Other Ambulatory Visit: Payer: Self-pay | Admitting: Thoracic Surgery (Cardiothoracic Vascular Surgery)

## 2024-03-21 DIAGNOSIS — Z9889 Other specified postprocedural states: Secondary | ICD-10-CM

## 2024-03-21 DIAGNOSIS — R5381 Other malaise: Secondary | ICD-10-CM | POA: Diagnosis not present

## 2024-03-21 LAB — MAGNESIUM: Magnesium: 2 mg/dL (ref 1.7–2.4)

## 2024-03-21 LAB — GLUCOSE, CAPILLARY
Glucose-Capillary: 92 mg/dL (ref 70–99)
Glucose-Capillary: 95 mg/dL (ref 70–99)
Glucose-Capillary: 155 mg/dL — ABNORMAL HIGH (ref 70–99)

## 2024-03-21 LAB — RENAL FUNCTION PANEL
Albumin: 3.6 g/dL (ref 3.5–5.0)
Anion gap: 14 (ref 5–15)
BUN: 35 mg/dL — ABNORMAL HIGH (ref 8–23)
CO2: 24 mmol/L (ref 22–32)
Calcium: 9.6 mg/dL (ref 8.9–10.3)
Chloride: 98 mmol/L (ref 98–111)
Creatinine, Ser: 3.55 mg/dL — ABNORMAL HIGH (ref 0.44–1.00)
GFR, Estimated: 13 mL/min — ABNORMAL LOW
Glucose, Bld: 90 mg/dL (ref 70–99)
Phosphorus: 4.1 mg/dL (ref 2.5–4.6)
Potassium: 4.5 mmol/L (ref 3.5–5.1)
Sodium: 136 mmol/L (ref 135–145)

## 2024-03-21 MED ORDER — GUAIFENESIN ER 600 MG PO TB12: 600.0000 mg | ORAL_TABLET | Freq: Two times a day (BID) | ORAL | Status: DC | PRN

## 2024-03-21 MED ORDER — HEPARIN SODIUM (PORCINE) 1000 UNIT/ML IJ SOLN
INTRAMUSCULAR | Status: AC
  Filled 2024-03-21: qty 5

## 2024-03-21 NOTE — Progress Notes (Addendum)
 Patient ID: Tracy Baker, female   DOB: May 07, 1955, 69 y.o.   MRN: [id number]  SNF referral's sent. Patient/family preferences for [facility]. They are aware that referrals were sent to a 100 mile radius.

## 2024-03-21 NOTE — Progress Notes (Signed)
" °  [facility] Progress Note    Assessment/ Plan:   Dialysis dependent AKI, initiated 02/16/2024: Creatinine normal at baseline Currently on MWF schedule: 3.75h, no heparin . TDC.  Following UOP and labs and intradialytic window for any suggestive GFR recovery, none to date; asked CIR staff to try to capture all UOP Currently using Left IJ TDC placed on 12/22 with IR.   On midodrine  10 TID-  better with BP Renal navigator following for clip, likely outpatient to [facility] kidney center.   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 On antifungal therapy  Anemia -transfuse PRN for Hgb <7; Hb 10s  Hyponatremia: Resolved  Will follow.    Subjective:    No new issues, working with therapy Still not fully quantified UOP Change in BUN and creatinine suggesting low GFR, and need for ongoing HD. For dialysis today after rehab   Objective:   BP 114/64 (BP Location: Left Arm)   Pulse 69   Temp 98.4 F (36.9 C) (Oral)   Resp 18   Ht 5' 4 (1.626 m)   Wt 62.9 kg   SpO2 97%   BMI 23.80 kg/m   Intake/Output Summary (Last 24 hours) at 03/21/2024 1138 Last data filed at 03/21/2024 0700 Gross per 24 hour  Intake 240 ml  Output 200 ml  Net 40 ml     Weight change:   Physical Exam: Gen:  NAD, working with therapy CVS: normal rate, no rub Resp: bl chest rise, normal wob Abd: soft Ext: No significant edema noted Dialysis access:IJ tunneled  HD cath  Imaging: No results found.       Labs: BMET Recent Labs  Lab 03/14/24 1400 03/15/24 0601 03/18/24 0517 03/19/24 0456 03/20/24 0519 03/21/24 0532  NA 132* 131* 136 138 136  136 136  K 4.7 4.1 4.2 4.4 4.4  4.2 4.5  CL 93* 92* 97* 98 96*  97* 98  CO2 21* 26 28 25 27  28 24   GLUCOSE 114* 105* 99 94 84  86 90  BUN 104* 35* 29* 46* 22  22 35*  CREATININE 3.90* 2.24* 2.93* 4.35* 2.53*  2.55* 3.55*  CALCIUM  9.7 9.5 9.1 9.5 9.1  9.1 9.6  PHOS  --    --   --  4.7* 2.8 4.1   CBC Recent Labs  Lab 03/14/24 1224 03/17/24 0622  WBC 10.0 8.7  NEUTROABS 8.0* 6.0  HGB 12.3 10.3*  HCT 36.5 30.7*  MCV 95.1 96.2  PLT 163 157    Medications:     amiodarone   200 mg Oral Daily   apixaban   5 mg Oral BID   ascorbic acid   500 mg Oral Q supper   cholecalciferol   1,000 Units Oral Daily   fluconazole   400 mg Oral Q supper   Followed by   NOREEN ON 05/05/2024] fluconazole   200 mg Oral Daily   Gerhardt's butt cream   Topical BID   hydrocerin   Topical BID   megestrol   80 mg Oral BID   melatonin  3 mg Oral QHS   midodrine   10 mg Oral TID WC   mirtazapine   30 mg Oral QHS   mouth rinse  15 mL Mouth Rinse 4 times per day   pantoprazole   40 mg Oral Daily   sertraline   25 mg Oral QHS   sulfamethoxazole -trimethoprim   1 tablet Oral Q12H       "

## 2024-03-21 NOTE — Progress Notes (Signed)
 Physical Therapy Session Note  Patient Details  Name: Tracy Baker MRN: [id number] Date of Birth: [birth year]  Today's Date: 03/21/2024 PT Individual Time: 1100-1156 PT Individual Time Calculation (min): 56 min   Short Term Goals: Week 1:  PT Short Term Goal 1 (Week 1): pt will perform bed mobility with min A consistantly PT Short Term Goal 1 - Progress (Week 1): Met PT Short Term Goal 2 (Week 1): pt will perform bed<>chair transfers with LRAD and CGA PT Short Term Goal 2 - Progress (Week 1): Met PT Short Term Goal 3 (Week 1): pt will ambulate 38ft with LRAD and min A PT Short Term Goal 3 - Progress (Week 1): Met Week 2:  PT Short Term Goal 1 (Week 2): STG=LTG due to LOS  Skilled Therapeutic Interventions/Progress Updates:   Received pt semi-reclined in bed, pt agreeable to PT treatment, and denied any pain during session but reported fatigue. Session with emphasis on functional mobility/transfers, generalized strengthening and endurance, dynamic standing balance/coordination, ambulation, and simulated car transfers. Pt transferred semi-reclined<>sitting R EOB with HOB elevated and min A for trunk control. Pt performed all transfers with RW and CGA throughout session. Pt ambulated in/out of bathroom with RW and CGA. Pt able to manage brief with CGA for balance and able to void. Performed hygiene management seated without assist. Sat in WC at sink and performed hand hygiene mod I.   Went through sensation, MMT, and pain interference questionnaire in preparation for D/C to SNF. Pt transported to/from room in [facility] dependently for time management purposes. Pt performed simulated car transfer with RW and CGA, then ambulated 92ft on uneven surfaces (ramp) with RW and CGA - pt limited by fatigue and needed to sit immediately. NT present to take blood sugar. Located rollator for pt to trial and educated on rollator safety including brake management and importance backing rollator against wall prior to  sitting. Pt's daughter arrived and present to observe remainder of session. Pt ambulated ~46ft x 2 trials with rollator and CGA and practiced backing rollator against wall to sit during rest break. Pt requested to return to bed and transferred into supine with supervision. Concluded session with pt semi-reclined in bed with all needs within reach and daughter at bedside. Daughter reported needing to continue looking for facilities and asking if family could transport pt to avoid ambulance costs - discussed with treatment team. Daughter plans to be present for PT tomorrow for continued family education.   Therapy Documentation Precautions:  Precautions Precautions: Fall, Sternal Recall of Precautions/Restrictions: Impaired Precaution/Restrictions Comments: cortak Restrictions Weight Bearing Restrictions Per Provider Order: No Other Position/Activity Restrictions: Cardiac sternal precautions  Therapy/Group: Individual Therapy [name] [name] PT, DPT 03/21/2024, 6:51 AM

## 2024-03-21 NOTE — Progress Notes (Signed)
 Pt is tearful. Pt states she is so sick of being 'this way'. Helped pt out of blue scrubs and into gown for more comfort and to help her sleep as pt c/o insomnia last night. Took pt to bathroom, pt very weak and unsteady. Pt developed nose bleed while using bathroom and became very fearful it was not going to stop states it had happened once before earlier in hospital stay and was 'horrible and would not stop' refusing eliquis  dose at this time due to this. After toileting, bladder scanner used for PVR however noticed bleeding and mild dehiscence to previous surgical site on lower abd, same location need to apply probe to perform proper bladder scan.Tried to obtain bladder scan avoiding this site but was unsuccessful - due to this, did not continue with bladder scan.

## 2024-03-21 NOTE — [place] FL2 (Signed)
 " [place]  MEDICAID FL2 LEVEL OF CARE FORM     IDENTIFICATION  Patient Name: Tracy Baker Birthdate: 01/18/1956 Sex: female Admission Date (Current Location): 03/10/2024  [facility] and [place] Number:  Producer, Television/film/video and Address:  The . [facility], [address], [place], [place] [address]      Provider Number: [id number]  Attending Physician Name and Address:  Lorilee Sven SQUIBB, MD  Relative Name and Phone Number:  Sigal,[name] Daughter [phone]    Current Level of Care: Hospital Recommended Level of Care: Skilled Nursing Facility Prior Approval Number:    Date Approved/Denied:   PASRR Number: [id number]  Discharge Plan: SNF    Current Diagnoses: Patient Active Problem List   Diagnosis Date Noted   Debility 03/10/2024   AKI (acute kidney injury) 02/16/2024   Shock (HCC) 02/11/2024   Patient receiving ECMO 02/11/2024   Acute respiratory failure with hypoxia (HCC) 02/10/2024   S/P MVR (mitral valve replacement) 02/08/2024   Protein-calorie malnutrition, severe 01/28/2024   Epistaxis 01/22/2024   Acute candidal endocarditis 01/11/2024   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 albicans candidemia (HCC) 12/29/2023   Hypokalemia 12/29/2023   Hypocalcemia 12/29/2023   Transaminitis 12/29/2023   Colon cancer screening    Polyp of transverse colon     Orientation RESPIRATION BLADDER Height & Weight     Self, Time, Situation, Place  Normal Continent Weight: 138 lb 10.7 oz (62.9 kg) Height:  5' 4 (162.6 cm)  BEHAVIORAL SYMPTOMS/MOOD NEUROLOGICAL BOWEL NUTRITION STATUS   (N/a)   Continent Diet  AMBULATORY STATUS COMMUNICATION OF NEEDS Skin   Limited Assist Verbally PU Stage and Appropriate Care (stage 2 on her sacrum and DTI on left heel)                       Personal Care Assistance Level of Assistance  Bathing,  Feeding, Dressing Bathing Assistance: Limited assistance Feeding assistance: Limited assistance Dressing Assistance: Limited assistance     Functional Limitations Info   (None)          SPECIAL CARE FACTORS FREQUENCY  PT (By licensed PT), OT (By licensed OT)     PT Frequency: 5x weekly OT Frequency: 5x weekly            Contractures Contractures Info: Not present    Additional Factors Info  Code Status, Allergies Code Status Info: Full Allergies Info: Codeine; Bactoshield Chg (Chlorhexidine  Gluconate); Chlorhexidine ; Red Dye #40 (Allura Red)           Current Medications (03/21/2024):  This is the current hospital active medication list Current Facility-Administered Medications  Medication Dose Route Frequency Provider Last Rate Last Admin   acetaminophen  (TYLENOL ) tablet 325-650 mg  325-650 mg Oral Q4H PRN Love, Pamela S, PA-C   650 mg at 03/14/24 1129   albuterol  (PROVENTIL ) (2.5 MG/3ML) 0.083% nebulizer solution 2.5 mg  2.5 mg Nebulization Q4H PRN Love, Pamela S, PA-C       aluminum  hydroxide (AMPHOJEL/ALTERNAGEL) suspension 10 mL  10 mL Oral Q6H PRN Love, Pamela S, PA-C       amiodarone  (PACERONE ) tablet 200 mg  200 mg Oral Daily Love, Pamela S, PA-C   200 mg at 03/21/24 0747   antiseptic oral rinse (BIOTENE) solution 15 mL  15 mL Mouth Rinse PRN Maurice Sharlet RAMAN, PA-C  apixaban  (ELIQUIS ) tablet 5 mg  5 mg Oral BID Love, Pamela S, PA-C   5 mg at 03/21/24 9252   ascorbic acid  (VITAMIN C ) tablet 500 mg  500 mg Oral Q supper Love, Pamela S, PA-C   500 mg at 03/20/24 1700   cholecalciferol  (VITAMIN D3) 25 MCG (1000 UNIT) tablet 1,000 Units  1,000 Units Oral Daily Raulkar, Krutika P, MD   1,000 Units at 03/21/24 0747   dextromethorphan  (DELSYM ) 30 MG/5ML liquid 15 mg  15 mg Oral QID PRN Love, Pamela S, PA-C       fluconazole  (DIFLUCAN ) tablet 400 mg  400 mg Oral Q supper Merilee Linsey I, RPH   400 mg at 03/20/24 1702   Followed by   NOREEN ON 05/05/2024] fluconazole   (DIFLUCAN ) tablet 200 mg  200 mg Oral Daily Merilee Linsey I, RPH       Gerhardt's butt cream   Topical BID Love, Pamela S, PA-C   Given at 03/21/24 9252   guaiFENesin  (MUCINEX ) 12 hr tablet 600 mg  600 mg Oral BID PRN Raulkar, Krutika P, MD       hydrocerin (EUCERIN) cream   Topical BID Love, Pamela S, PA-C   Given at 03/21/24 9251   lidocaine  (XYLOCAINE ) 2 % jelly   Topical PRN Love, Pamela S, PA-C       magic mouthwash  5 mL Oral TID PRN Love, Pamela S, PA-C       megestrol  (MEGACE ) tablet 80 mg  80 mg Oral BID Love, Pamela S, PA-C   80 mg at 03/21/24 9356   melatonin tablet 3 mg  3 mg Oral QHS Raulkar, Krutika P, MD   3 mg at 03/20/24 2153   midodrine  (PROAMATINE ) tablet 10 mg  10 mg Oral TID WC Gearline Norris, MD   10 mg at 03/21/24 1100   milk and molasses enema  1 enema Rectal Daily PRN Love, Pamela S, PA-C       mirtazapine  (REMERON ) tablet 30 mg  30 mg Oral QHS Love, Pamela S, PA-C   30 mg at 03/20/24 2153   Oral care mouth rinse  15 mL Mouth Rinse 4 times per day Love, Pamela S, PA-C   15 mL at 03/21/24 1100   pantoprazole  (PROTONIX ) EC tablet 40 mg  40 mg Oral Daily Raulkar, Krutika P, MD   40 mg at 03/21/24 9362   polyethylene glycol (MIRALAX  / GLYCOLAX ) packet 17 g  17 g Oral Daily PRN Hammons, Suzen NOVAK, RPH       promethazine  (PHENERGAN ) tablet 12.5 mg  12.5 mg Oral Q6H PRN Love, Pamela S, PA-C       Or   promethazine  (PHENERGAN ) suppository 12.5 mg  12.5 mg Rectal Q6H PRN Love, Pamela S, PA-C       Or   promethazine  (PHENERGAN ) injection 12.5 mg  12.5 mg Intramuscular Q6H PRN Love, Pamela S, PA-C       sertraline  (ZOLOFT ) tablet 25 mg  25 mg Oral QHS Love, Pamela S, PA-C   25 mg at 03/20/24 2205   simethicone  (MYLICON) chewable tablet 80 mg  80 mg Oral QID PRN Love, Pamela S, PA-C       sorbitol  70 % solution 30 mL  30 mL Oral BID PRN Love, Pamela S, PA-C       sulfamethoxazole -trimethoprim  (BACTRIM ) 400-80 MG per tablet 1 tablet  1 tablet Oral Q12H Urbano Albright, MD    1 tablet at 03/21/24 0747     Discharge  Medications: Please see discharge summary for a list of discharge medications.  Relevant Imaging Results:  Relevant Lab Results:   Additional Information SSN: [id number]  [employer]  [place], [place]     "

## 2024-03-21 NOTE — Progress Notes (Signed)
 Occupational Therapy Session Note  Patient Details  Name: Tracy Baker MRN: [id number] Date of Birth: 1955/09/02  Today's Date: 03/21/2024 OT Individual Time: 0800-0900 OT Individual Time Calculation (min): 60 min  and Today's Date: 03/21/2024 OT Missed Time: 15 Minutes Missed Time Reason: Other (comment) (eating breakfast)   Short Term Goals: Week 2:  OT Short Term Goal 1 (Week 2): STG = LTG due to ELOS  Skilled Therapeutic Interventions/Progress Updates:    Pt eating breakfast upon arrival. Pt missed 15 mins skilled OT services at beginning of session. Will attempt to make up time as schedules allow. Skilled OT intervention with focus on bed mobility, sit<>stand, standing balance, functional transfers, BLE therex, and activity tolerance to increase independence with BADLs. Supine>sit EOB with supervision and HOB elevated. Sitting balance EOB with supervision. Pt with NO report of dizziness or lightheadedness. Sit<>stand from EOB with CGA. Amb with RW 3' to w/c with CGA. Pt completed grooming tasks at sink. OTA donned Ted hose on pt and educated pt on technique to lessen difficulty with donning compression hose. Pt transitioned to gym and transferred to NuStep. NuStep 5 mins x 2 level 4 with rest breaks-BLE only. Pt with no report of lightheadedness throughout session. BP seated: 118/67 (83) and HR 73. Abdominal binder not donned. Pt remained seated in w/c with all needs within reach. NT present in room.  Therapy Documentation Precautions:  Precautions Precautions: Fall, Sternal Recall of Precautions/Restrictions: Impaired Precaution/Restrictions Comments: cortak Restrictions Weight Bearing Restrictions Per Provider Order: No Other Position/Activity Restrictions: Cardiac sternal precautions General: General OT Amount of Missed Time: 15 Minutes   Pain: Pt denies pain this morning    Therapy/Group: Individual Therapy  Maritza Debby Mare 03/21/2024, 11:09 AM

## 2024-03-21 NOTE — Progress Notes (Signed)
 "                                                        PROGRESS NOTE   Subjective/Complaints: She is feeling well this morning with improved energy Discussed her creatinine from yesterday, that her livery enzymes are stable  ROS:  Pt denies SOB, abd pain, CP, N/V/C/D, and vision changes +fatigue- improved before dialysis days  Objective:   No results found.  No results for input(s): WBC, HGB, HCT, PLT in the last 72 hours.  Recent Labs    03/20/24 0519 03/21/24 0532  NA 136  136 136  K 4.4  4.2 4.5  CL 96*  97* 98  CO2 27  28 24   GLUCOSE 84  86 90  BUN 22  22 35*  CREATININE 2.53*  2.55* 3.55*  CALCIUM  9.1  9.1 9.6    Intake/Output Summary (Last 24 hours) at 03/21/2024 0950 Last data filed at 03/21/2024 0700 Gross per 24 hour  Intake 240 ml  Output 200 ml  Net 40 ml      Wound 03/10/24 2051 Pressure Injury Sacrum Right Stage 2 -  Partial thickness loss of dermis presenting as a shallow open injury with a red, pink wound bed without slough. (Active)     Wound 03/15/24 1507 Pressure Injury Foot Left;Posterior Deep Tissue Pressure Injury - Purple or maroon localized area of discolored intact skin or blood-filled blister due to damage of underlying soft tissue from pressure and/or shear. (Active)    Physical Exam: Vital Signs Blood pressure 114/64, pulse 69, temperature 98.4 F (36.9 C), temperature source Oral, resp. rate 18, height 5' 4 (1.626 m), weight 62.9 kg, SpO2 97%.  General: awake, alert, appropriate, NAD, laying in bed appears comfortable; vitals reviewed HENT: conjugate gaze; oropharynx dry CV: regular rate and rhythm currently; no JVD Pulmonary: CTA B/L; nonlabored GI: soft, NT, ND, (+)BS normoactive Extr: No LE edema Psychiatric: appropriate, cooperative Neurological: Ox3  Skin: has chest wall incision, tunneled catheter on left chest wall, right buttock wound and bilateral heel wounds--> left heel now with quarter sized stage  II  Neuro: Alert,4-/5 strength throughout, good attention, sensation is intact, +bilateral hand tremors, stable 1/16   Assessment/Plan: 1. Functional deficits which require 3+ hours per day of interdisciplinary therapy in a comprehensive inpatient rehab setting. Physiatrist is providing close team supervision and 24 hour management of active medical problems listed below. Physiatrist and rehab team continue to assess barriers to discharge/monitor patient progress toward functional and medical goals  Care Tool:  Bathing    Body parts bathed by patient: Right arm, Left arm, Chest, Abdomen, Front perineal area, Face   Body parts bathed by helper: Buttocks, Right upper leg, Left upper leg, Right lower leg, Left lower leg     Bathing assist Assist Level: Moderate Assistance - Patient 50 - 74%     Upper Body Dressing/Undressing Upper body dressing   What is the patient wearing?: Pull over shirt    Upper body assist Assist Level: Maximal Assistance - Patient 25 - 49%    Lower Body Dressing/Undressing Lower body dressing      What is the patient wearing?: Incontinence brief, Pants     Lower body assist Assist for lower body dressing: Total Assistance - Patient < 25%     Toileting  Toileting    Toileting assist Assist for toileting: Minimal Assistance - Patient > 75%     Transfers Chair/bed transfer  Transfers assist  Chair/bed transfer activity did not occur: Safety/medical concerns (pain, fatigue, dizziness)  Chair/bed transfer assist level: Contact Guard/Touching assist     Locomotion Ambulation   Ambulation assist   Ambulation activity did not occur: Safety/medical concerns (pain, fatigue, dizziness)  Assist level: Contact Guard/Touching assist Assistive device: Walker-rolling Max distance: 9ft   Walk 10 feet activity   Assist  Walk 10 feet activity did not occur: Safety/medical concerns (pain, fatigue, dizziness)  Assist level: Minimal Assistance -  Patient > 75% Assistive device: Walker-rolling   Walk 50 feet activity   Assist Walk 50 feet with 2 turns activity did not occur: Safety/medical concerns (pain, fatigue, dizziness)         Walk 150 feet activity   Assist Walk 150 feet activity did not occur: Safety/medical concerns (pain, fatigue, dizziness)         Walk 10 feet on uneven surface  activity   Assist Walk 10 feet on uneven surfaces activity did not occur: Safety/medical concerns (pain, fatigue, dizziness)         Wheelchair     Assist Is the patient using a wheelchair?: Yes Type of Wheelchair: Manual Wheelchair activity did not occur: Safety/medical concerns (pain, fatigue, dizziness)         Wheelchair 50 feet with 2 turns activity    Assist    Wheelchair 50 feet with 2 turns activity did not occur: Safety/medical concerns (pain, fatigue, dizziness)       Wheelchair 150 feet activity     Assist  Wheelchair 150 feet activity did not occur: Safety/medical concerns (pain, fatigue, dizziness)       Blood pressure 114/64, pulse 69, temperature 98.4 F (36.9 C), temperature source Oral, resp. rate 18, height 5' 4 (1.626 m), weight 62.9 kg, SpO2 97%.  Medical Problem List and Plan: 1. Functional deficits secondary to debility after prolonged hospital stay related to mitral valve vegetations and MV repair and ultimate replacement.             -patient may  shower             -ELOS/Goals: 15 days, supervision goals with PT, OT, SLP  Con't CIR PT, OT and SLP Continue Eliquis  Will call son to update him today Full code but no chest compressions -Message from SW- daughter requesting call-daughter would prefer patient stay longer than current discharge date-Will defer decision until team conference tomorrow and Dr. Lorilee will be there tomorrow.  After team meeting daughter requests somebody call her to let her know regarding decision, if she does not pick up she would like message to  be left and then she can call back. Discussed SNF placement  2. Hypotension: medications reviewed and is on amiodarone , d/c clonazepam , continue compression garments, continue midodrine  10mg  TID, continue abdominal binder, discussed with patient and son that this is likely to be worst post-dialysis  3. Pain: A) Bilateral heel pain: d/ced Lyrica  because this pain improves B) Right ankle pain: ASO ordered, placed nursing for heat application in the morning for stiffness and ice in the afternoon to reduce inflammation. XR reviewed and is stable  4. Insomnia: continue Remeron , scheduled melatonin        5. Neuropsych/cognition: This patient is capable of making decisions on her own behalf.  6. Skin/Wound Care: Air mattress overlay for pressure relief.             --  continue Ensure and prosource             --added Vitamin C  and Zinc to promote wound healing.   1/11 left heel appears to be evolution of what was present on admit   -continue above, prevalons   -discussed with pt importance of pressure relief, nutrition  7. Decreased appetite: discussed with patient that she feels this is improving, will d/c cortrak, discussed that family is bringing foods from home NO FR needed per  nephrology.    -appetite/intake is picking up.  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             --F/u with ID 01/06?-->discussed with Dr. Overton who will follow up with for input.   9. 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   10. AKI with dialysis initiated 02/16/24: On HD MWF at the end of the day to help with tolerance of therapy             Cr reviewed and is improving             --CLIP on hold. Needs to be able to sit up in a chair for HD.    11. A fib: Monitor HR TID--continue amiodarone  and eliquis .   12. Anemia of critical illness:Being monitored and stable.              --thrombocytopenia has resolved.   13. Urinary  urgency: UA ordered 1/9, PVRs ordered, f/u UA  1/11 UA +, need UCX   -begin empiric bactrim  ss bid  1/12- Pt's upset has UTI- why do I have one at all- and thought she was already on ABX to prevent UTI- explained she was on antifungals, not ABX currently.  -1/13 urine culture with Enterobacter cloaca sensitive to Bactrim , continue current regimen, completed 3 day course  14. Daytime fatigue:  -continue D3 daily B12 ordered (one time injection given red dye allergy), d/c seroquel , change lyrica  to HS, decreased therapy to 15/7, d/c clonazepam  at night, discussed that remeron  could contribute but will maintain for now given insomnia and to help stimulate appetite, melatonin scheduled at night to help with insomnia  15. Hyponatremia: Na level reviewed and this improved, continue to monitor Na levels with HD  16. Bilateral hand tremors: liver enzymes reviewed and are wnl    LOS: 11 days A FACE TO FACE EVALUATION WAS PERFORMED  Tracy Baker 03/21/2024, 9:50 AM     "

## 2024-03-21 NOTE — Plan of Care (Signed)
" °  Problem: Consults Goal: RH GENERAL PATIENT EDUCATION Description: See Patient Education module for education specifics. Outcome: Progressing Goal: Skin Care Protocol Initiated - if Braden Score 18 or less Description: If consults are not indicated, leave blank or document N/A Outcome: Progressing Goal: Nutrition Consult-if indicated Outcome: Progressing Goal: Diabetes Guidelines if Diabetic/Glucose > 140 Description: If diabetic or lab glucose is > 140 mg/dl - Initiate Diabetes/Hyperglycemia Guidelines & Document Interventions  Outcome: Progressing   Problem: RH BOWEL ELIMINATION Goal: RH STG MANAGE BOWEL WITH ASSISTANCE Description: STG Manage Bowel with mod I  Assistance. Outcome: Progressing Goal: RH STG MANAGE BOWEL W/MEDICATION W/ASSISTANCE Description: STG Manage Bowel with Medication with mod I Assistance. Outcome: Progressing   Problem: RH SKIN INTEGRITY Goal: RH STG SKIN FREE OF INFECTION/BREAKDOWN Description: Manage skin w min assist Outcome: Progressing Goal: RH STG MAINTAIN SKIN INTEGRITY WITH ASSISTANCE Description: STG Maintain Skin Integrity With Assistance. Outcome: Progressing Goal: RH STG ABLE TO PERFORM INCISION/WOUND CARE W/ASSISTANCE Description: STG Able To Perform Incision/Wound Care With min Assistance. Outcome: Progressing   Problem: RH SAFETY Goal: RH STG ADHERE TO SAFETY PRECAUTIONS W/ASSISTANCE/DEVICE Description: STG Adhere to Safety Precautions With cues Assistance/Device. Outcome: Progressing   Problem: RH PAIN MANAGEMENT Goal: RH STG PAIN MANAGED AT OR BELOW PT'S PAIN GOAL Description: Pain < 4 with prns Outcome: Progressing   Problem: RH KNOWLEDGE DEFICIT GENERAL Goal: RH STG INCREASE KNOWLEDGE OF SELF CARE AFTER HOSPITALIZATION Description: Patient and family will be able to manage care at discharge using educational resources for medication, skin care and dietary modification independently Outcome: Progressing   "

## 2024-03-21 NOTE — Progress Notes (Signed)
 Recreational Therapy Session Note  Patient Details  Name: Tracy Baker MRN: [id number] Date of Birth: [birth year] Today's Date: 03/21/2024 Session Note:  03/20/2024 Pain: no c/o  Goal:  Pt will ambulate 10 feet with RW in community environment with Min assist.  MET  Skilled Therapeutic Interventions/Progress Updates: Pt in bed upon arrival, reported poor sleep previous night but agreeable to therapy.  Session focused on activity tolerance, dynamic balance, functional mobility in community environment (gift shop), increasing mood.  Pt performed sit-stands/stand pivot transfers with RW with CGA.  Once seated in w/c, pt taken to the gift shop where she ambulated ~25 x2 with RW CGA.  Pt reaching for items on various shelving heights with close supervision.  Discussion with pt throughout session about leisure interests and their impact on health & wellness.  Pt smiling and laughing during this session.   Therapy/Group: Co-Treatment   Damonte Frieson 03/21/2024, 9:12 AM

## 2024-03-21 NOTE — Progress Notes (Signed)
" °   03/21/24 1908  Vitals  Temp 98.3 F (36.8 C)  Pulse Rate 98  Resp 16  BP 106/63  SpO2 99 %  O2 Device Room Air  Weight 61 kg  Type of Weight Post-Dialysis  Oxygen Therapy  Patient Activity (if Appropriate) In bed  Pulse Oximetry Type Continuous  Oximetry Probe Site Changed No  Post Treatment  Dialyzer Clearance Lightly streaked  Hemodialysis Intake (mL) 0 mL  Liters Processed 78.3  Fluid Removed (mL) 1900 mL  Tolerated HD Treatment Yes   Received patient in bed to unit.  Alert and oriented.  Informed consent signed and in chart.   TX duration:3.5  Patient tolerated well.  Transported back to the room  Alert, without acute distress.  Hand-off given to patient's nurse.   Access used: RIJDLC Access issues: no complications  Total UF removed: 1900 Medication(s) given: none   Delon LITTIE [facility] "

## 2024-03-21 NOTE — Progress Notes (Signed)
 Recreational Therapy Session Note  Patient Details  Name: Tracy Baker MRN: [id number] Date of Birth: 1955-04-15 Today's Date: 03/21/2024 Skilled Therapeutic Interventions/Progress Updates:   Goal:  Pt will be provided with education on stress management/coping.  MET Pt will actively participate in discussion by verbally completing handout with LRT/CTRS.  MET  Pt participated in stress managment/coping discussion today focused on stress exploration including factors that contribute to stress, factors that protect against stress and potential coping strategies.  Coping strategies included deep breathing, progressive muscle relaxation, imagery & challenging irrational thoughts.  Pt fully participated in discussion sharing factors that contribute to her stress & protect against stress.  Pt appreciative of this session.  Handouts provided.  Therapy/Group: Individual Therapy   Tavon Magnussen 03/21/2024, 11:12 AM

## 2024-03-22 DIAGNOSIS — R5381 Other malaise: Secondary | ICD-10-CM | POA: Diagnosis not present

## 2024-03-22 LAB — GLUCOSE, CAPILLARY
Glucose-Capillary: 103 mg/dL — ABNORMAL HIGH (ref 70–99)
Glucose-Capillary: 119 mg/dL — ABNORMAL HIGH (ref 70–99)
Glucose-Capillary: 122 mg/dL — ABNORMAL HIGH (ref 70–99)

## 2024-03-22 LAB — MAGNESIUM: Magnesium: 1.9 mg/dL (ref 1.7–2.4)

## 2024-03-22 LAB — RENAL FUNCTION PANEL
Albumin: 3.7 g/dL (ref 3.5–5.0)
Anion gap: 14 (ref 5–15)
BUN: 28 mg/dL — ABNORMAL HIGH (ref 8–23)
CO2: 26 mmol/L (ref 22–32)
Calcium: 9.6 mg/dL (ref 8.9–10.3)
Chloride: 96 mmol/L — ABNORMAL LOW (ref 98–111)
Creatinine, Ser: 2.85 mg/dL — ABNORMAL HIGH (ref 0.44–1.00)
GFR, Estimated: 17 mL/min — ABNORMAL LOW
Glucose, Bld: 107 mg/dL — ABNORMAL HIGH (ref 70–99)
Phosphorus: 3.2 mg/dL (ref 2.5–4.6)
Potassium: 4.6 mmol/L (ref 3.5–5.1)
Sodium: 135 mmol/L (ref 135–145)

## 2024-03-22 MED ORDER — MELATONIN 5 MG PO TABS
5.0000 mg | ORAL_TABLET | Freq: Every day | ORAL | Status: DC
  Administered 2024-03-22 – 2024-04-01 (×11): 5 mg via ORAL
  Filled 2024-03-22 (×11): qty 1

## 2024-03-22 MED ORDER — ORAL CARE MOUTH RINSE: 15.0000 mL | OROMUCOSAL | Status: DC | PRN

## 2024-03-22 MED ORDER — CLONAZEPAM 0.25 MG PO TBDP
0.2500 mg | ORAL_TABLET | Freq: Every evening | ORAL | Status: DC | PRN
  Filled 2024-03-22: qty 1

## 2024-03-22 NOTE — Progress Notes (Signed)
 "                                                        PROGRESS NOTE   Subjective/Complaints:  No events overnight.  Vitals stable.  Patient complaining of insomnia, denies any pain or nocturia waking her up, does think it is somewhat related to anxiety but cannot name specific stressors.  Notes she is on clonazepam  0.25 to 0.5 mg nightly at home. Complaining of significant daytime fatigue, due to poor sleep.  ROS:  Pt denies SOB, abd pain, CP, N/V/C/D, and vision changes +fatigue + Insomnia Objective:   No results found.  No results for input(s): WBC, HGB, HCT, PLT in the last 72 hours.  Recent Labs    03/21/24 0532 03/22/24 0916  NA 136 135  K 4.5 4.6  CL 98 96*  CO2 24 26  GLUCOSE 90 107*  BUN 35* 28*  CREATININE 3.55* 2.85*  CALCIUM  9.6 9.6    Intake/Output Summary (Last 24 hours) at 03/22/2024 1539 Last data filed at 03/22/2024 1228 Gross per 24 hour  Intake 594 ml  Output 1900 ml  Net -1306 ml      Wound 03/10/24 2051 Pressure Injury Sacrum Right Stage 2 -  Partial thickness loss of dermis presenting as a shallow open injury with a red, pink wound bed without slough. (Active)     Wound 03/15/24 1507 Pressure Injury Foot Left;Posterior Deep Tissue Pressure Injury - Purple or maroon localized area of discolored intact skin or blood-filled blister due to damage of underlying soft tissue from pressure and/or shear. (Active)    Physical Exam: Vital Signs Blood pressure (!) 127/91, pulse (!) 55, temperature 98.2 F (36.8 C), resp. rate 18, height 5' 4 (1.626 m), weight 61 kg, SpO2 97%.  General: awake, alert, appropriate, NAD, laying in bed appears comfortable; vitals reviewed HENT: conjugate gaze; oropharynx dry CV: regular rate and rhythm currently; no JVD Pulmonary: CTA B/L; nonlabored GI: soft, NT, ND, (+)BS normoactive Extr: No LE edema Psychiatric: appropriate, cooperative Neurological: Ox3  Skin: has chest wall incision, tunneled catheter on  left chest wall, right buttock wound and bilateral heel wounds--> left heel now with quarter sized stage II  Neuro: Alert,4-/5 strength throughout, good attention, sensation is intact, +bilateral hand tremors, stable 1/16  Physical exam unchanged from the above on reexamination 03/22/24    Assessment/Plan: 1. Functional deficits which require 3+ hours per day of interdisciplinary therapy in a comprehensive inpatient rehab setting. Physiatrist is providing close team supervision and 24 hour management of active medical problems listed below. Physiatrist and rehab team continue to assess barriers to discharge/monitor patient progress toward functional and medical goals  Care Tool:  Bathing    Body parts bathed by patient: Right arm, Left arm, Chest, Abdomen, Front perineal area, Face   Body parts bathed by helper: Buttocks, Right upper leg, Left upper leg, Right lower leg, Left lower leg     Bathing assist Assist Level: Moderate Assistance - Patient 50 - 74%     Upper Body Dressing/Undressing Upper body dressing   What is the patient wearing?: Pull over shirt    Upper body assist Assist Level: Maximal Assistance - Patient 25 - 49%    Lower Body Dressing/Undressing Lower body dressing      What is the patient wearing?:  Incontinence brief, Pants     Lower body assist Assist for lower body dressing: Total Assistance - Patient < 25%     Toileting Toileting    Toileting assist Assist for toileting: Minimal Assistance - Patient > 75%     Transfers Chair/bed transfer  Transfers assist  Chair/bed transfer activity did not occur: Safety/medical concerns (pain, fatigue, dizziness)  Chair/bed transfer assist level: Contact Guard/Touching assist     Locomotion Ambulation   Ambulation assist   Ambulation activity did not occur: Safety/medical concerns (pain, fatigue, dizziness)  Assist level: Contact Guard/Touching assist Assistive device: Walker-rolling Max distance:  82ft   Walk 10 feet activity   Assist  Walk 10 feet activity did not occur: Safety/medical concerns (pain, fatigue, dizziness)  Assist level: Minimal Assistance - Patient > 75% Assistive device: Walker-rolling   Walk 50 feet activity   Assist Walk 50 feet with 2 turns activity did not occur: Safety/medical concerns (pain, fatigue, dizziness)         Walk 150 feet activity   Assist Walk 150 feet activity did not occur: Safety/medical concerns (pain, fatigue, dizziness)         Walk 10 feet on uneven surface  activity   Assist Walk 10 feet on uneven surfaces activity did not occur: Safety/medical concerns (pain, fatigue, dizziness)   Assist level: Contact Guard/Touching assist Assistive device: Rollator   Wheelchair     Assist Is the patient using a wheelchair?: Yes Type of Wheelchair: Manual Wheelchair activity did not occur: Safety/medical concerns (pain, fatigue, dizziness)         Wheelchair 50 feet with 2 turns activity    Assist    Wheelchair 50 feet with 2 turns activity did not occur: Safety/medical concerns (pain, fatigue, dizziness)       Wheelchair 150 feet activity     Assist  Wheelchair 150 feet activity did not occur: Safety/medical concerns (pain, fatigue, dizziness)       Blood pressure (!) 127/91, pulse (!) 55, temperature 98.2 F (36.8 C), resp. rate 18, height 5' 4 (1.626 m), weight 61 kg, SpO2 97%.  Medical Problem List and Plan: 1. Functional deficits secondary to debility after prolonged hospital stay related to mitral valve vegetations and MV repair and ultimate replacement.             -patient may  shower             -ELOS/Goals: 15 days, supervision goals with PT, OT, SLP  Con't CIR PT, OT and SLP Continue Eliquis  Will call son to update him today Full code but no chest compressions -Message from SW- daughter requesting call-daughter would prefer patient stay longer than current discharge date-Will defer  decision until team conference tomorrow and Dr. Lorilee will be there tomorrow.  After team meeting daughter requests somebody call her to let her know regarding decision, if she does not pick up she would like message to be left and then she can call back. Discussed SNF placement  2. Hypotension: medications reviewed and is on amiodarone , d/c clonazepam , continue compression garments, continue midodrine  10mg  TID, continue abdominal binder, discussed with patient and son that this is likely to be worst post-dialysis  3. Pain: A) Bilateral heel pain: d/ced Lyrica  because this pain improves B) Right ankle pain: ASO ordered, placed nursing for heat application in the morning for stiffness and ice in the afternoon to reduce inflammation. XR reviewed and is stable  4. Insomnia: continue Remeron , scheduled melatonin        -  1-17: Poor response to measures as above.  Increase melatonin to 5 mg, reviewed chart and confirmed patient with home medication clonazepam , will allow very low-dose 0.25 mg nightly as needed  5. Neuropsych/cognition: This patient is capable of making decisions on her own behalf.  6. Skin/Wound Care: Air mattress overlay for pressure relief.             --continue Ensure and prosource             --added Vitamin C  and Zinc to promote wound healing.   1/11 left heel appears to be evolution of what was present on admit   -continue above, prevalons   -discussed with pt importance of pressure relief, nutrition  7. Decreased appetite: discussed with patient that she feels this is improving, will d/c cortrak, discussed that family is bringing foods from home NO FR needed per  nephrology.    -appetite/intake is picking up.  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             --F/u with ID 01/06?-->discussed with Dr. Overton who will follow up with for input.   9. 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    10. AKI with dialysis initiated 02/16/24: On HD MWF at the end of the day to help with tolerance of therapy             Cr reviewed and is improving             --CLIP on hold. Needs to be able to sit up in a chair for HD.    11. A fib: Monitor HR TID--continue amiodarone  and eliquis .   12. Anemia of critical illness:Being monitored and stable.              --thrombocytopenia has resolved.   13. Urinary urgency: UA ordered 1/9, PVRs ordered, f/u UA  1/11 UA +, need UCX   -begin empiric bactrim  ss bid  1/12- Pt's upset has UTI- why do I have one at all- and thought she was already on ABX to prevent UTI- explained she was on antifungals, not ABX currently.  -1/13 urine culture with Enterobacter cloaca sensitive to Bactrim , continue current regimen, completed 3 day course  14. Daytime fatigue:  -continue D3 daily B12 ordered (one time injection given red dye allergy), d/c seroquel , change lyrica  to HS, decreased therapy to 15/7, d/c clonazepam  at night, discussed that remeron  could contribute but will maintain for now given insomnia and to help stimulate appetite, melatonin scheduled at night to help with insomnia  15. Hyponatremia: Na level reviewed and this improved, continue to monitor Na levels with HD  16. Bilateral hand tremors: liver enzymes reviewed and are wnl    LOS: 12 days A FACE TO FACE EVALUATION WAS PERFORMED  Joesph JAYSON Likes 03/22/2024, 3:39 PM     "

## 2024-03-22 NOTE — Progress Notes (Signed)
" °  [facility] Progress Note    Assessment/ Plan:   Dialysis dependent AKI, initiated 02/16/2024: Creatinine normal at baseline Currently on MWF schedule: 3.75h, no heparin . TDC.  Following UOP and labs and intradialytic window for any suggestive GFR recovery, none to date; asked CIR staff to try to capture all UOP Currently using Left IJ TDC placed on 12/22 with IR.   On midodrine  10 TID-  better with BP Renal navigator following for clip, likely outpatient to [facility] center.   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 On antifungal therapy  Anemia -transfuse PRN for Hgb <7; Hb 10s  Hyponatremia: Resolved  Will follow.    Subjective:    No new issues, HD yesterday Not fully quantified UOP   Objective:   BP 107/60 (BP Location: Left Arm)   Pulse 69   Temp 98.4 F (36.9 C)   Resp 18   Ht 5' 4 (1.626 m)   Wt 61 kg   SpO2 97%   BMI 23.08 kg/m   Intake/Output Summary (Last 24 hours) at 03/22/2024 1052 Last data filed at 03/22/2024 9261 Gross per 24 hour  Intake 458 ml  Output 1900 ml  Net -1442 ml     Weight change:   Physical Exam: Gen:  NAD, working with therapy CVS: normal rate, no rub Resp: bl chest rise, normal wob Abd: soft Ext: No significant edema noted Dialysis access:IJ tunneled  HD cath  Imaging: No results found.       Labs: BMET Recent Labs  Lab 03/18/24 0517 03/19/24 0456 03/20/24 0519 03/21/24 0532 03/22/24 0916  NA 136 138 136  136 136 135  K 4.2 4.4 4.4  4.2 4.5 4.6  CL 97* 98 96*  97* 98 96*  CO2 28 25 27  28 24 26   GLUCOSE 99 94 84  86 90 107*  BUN 29* 46* 22  22 35* 28*  CREATININE 2.93* 4.35* 2.53*  2.55* 3.55* 2.85*  CALCIUM  9.1 9.5 9.1  9.1 9.6 9.6  PHOS  --  4.7* 2.8 4.1 3.2   CBC Recent Labs  Lab 03/17/24 0622  WBC 8.7  NEUTROABS 6.0  HGB 10.3*  HCT 30.7*  MCV 96.2  PLT 157    Medications:     amiodarone   200 mg  Oral Daily   apixaban   5 mg Oral BID   ascorbic acid   500 mg Oral Q supper   cholecalciferol   1,000 Units Oral Daily   fluconazole   400 mg Oral Q supper   Followed by   NOREEN ON 05/05/2024] fluconazole   200 mg Oral Daily   Gerhardt's butt cream   Topical BID   hydrocerin   Topical BID   megestrol   80 mg Oral BID   melatonin  5 mg Oral QHS   midodrine   10 mg Oral TID WC   mirtazapine   30 mg Oral QHS   mouth rinse  15 mL Mouth Rinse 4 times per day   pantoprazole   40 mg Oral Daily   sertraline   25 mg Oral QHS       "

## 2024-03-22 NOTE — Plan of Care (Signed)
" °  Problem: Consults Goal: RH GENERAL PATIENT EDUCATION Description: See Patient Education module for education specifics. Outcome: Progressing Goal: Skin Care Protocol Initiated - if Braden Score 18 or less Description: If consults are not indicated, leave blank or document N/A Outcome: Progressing Goal: Nutrition Consult-if indicated Outcome: Progressing Goal: Diabetes Guidelines if Diabetic/Glucose > 140 Description: If diabetic or lab glucose is > 140 mg/dl - Initiate Diabetes/Hyperglycemia Guidelines & Document Interventions  Outcome: Progressing   Problem: RH BOWEL ELIMINATION Goal: RH STG MANAGE BOWEL WITH ASSISTANCE Description: STG Manage Bowel with mod I  Assistance. Outcome: Progressing Goal: RH STG MANAGE BOWEL W/MEDICATION W/ASSISTANCE Description: STG Manage Bowel with Medication with mod I Assistance. Outcome: Progressing   Problem: RH SKIN INTEGRITY Goal: RH STG SKIN FREE OF INFECTION/BREAKDOWN Description: Manage skin w min assist Outcome: Progressing Goal: RH STG MAINTAIN SKIN INTEGRITY WITH ASSISTANCE Description: STG Maintain Skin Integrity With Assistance. Outcome: Progressing Goal: RH STG ABLE TO PERFORM INCISION/WOUND CARE W/ASSISTANCE Description: STG Able To Perform Incision/Wound Care With min Assistance. Outcome: Progressing   Problem: RH SAFETY Goal: RH STG ADHERE TO SAFETY PRECAUTIONS W/ASSISTANCE/DEVICE Description: STG Adhere to Safety Precautions With cues Assistance/Device. Outcome: Progressing   Problem: RH PAIN MANAGEMENT Goal: RH STG PAIN MANAGED AT OR BELOW PT'S PAIN GOAL Description: Pain < 4 with prns Outcome: Progressing   Problem: RH KNOWLEDGE DEFICIT GENERAL Goal: RH STG INCREASE KNOWLEDGE OF SELF CARE AFTER HOSPITALIZATION Description: Patient and family will be able to manage care at discharge using educational resources for medication, skin care and dietary modification independently Outcome: Progressing   "

## 2024-03-22 NOTE — Progress Notes (Signed)
 Occupational Therapy Note  Patient Details  Name: Tracy Baker MRN: [id number] Date of Birth: [birth year]  Today's Date: 03/22/2024 OT Missed Time: 45 Minutes Missed Time Reason: Patient fatigue  Pt resting in bed upon arrival. Pt politely declined therapy this morning, stating she was tired from dialysis the previous day. Pt reported she would work with PT in afternoon session. Pt missed 45 mins skilled OT services. Will attempt to make up as schedules allow.   Maritza Ned [facility] 03/22/2024, 10:32 AM

## 2024-03-22 NOTE — Progress Notes (Signed)
 Occupational Therapy Session Note  Patient Details  Name: Tracy Baker MRN: [id number] Date of Birth: 1955-04-26  Today's Date: 03/22/2024 OT Individual Time: 8584-8474 OT Individual Time Calculation (min): 70 min    Short Term Goals: Week 2:  OT Short Term Goal 1 (Week 2): STG = LTG due to ELOS  Skilled Therapeutic Interventions/Progress Updates:  Skilled OT intervention completed with focus on functional mobility/endurance, cognition, toileting. Pt received upright in bed. No pain reported but stated she's been needing to have a BM the past couple of days and being nauseous in combination with not sleeping well has affected her energy. Pt not eager for therapy, but with gentle encouragement for desired activities I.e. hair washing, pt agreeable to get OOB. Transitioned EOB, sit > stand and ambulatory transfer using rollator with supervision > w/c.   Seated in w/c, OT assisted pt with hair washing total A with hair washing tray due to HD port. Pt did engage in hair drying for BUE endurance with improved tolerance and effort this session with less fatigue than last time a week ago. Transported dependently in w/c <> gym.  Pt participated in the following activities in sitting to address cognitive strategies needed for independence and safety with BADL management: -Motor speed dot test- 1.8 sec, min difficulty -Maze- no difficulty, but mild incoordination visible -Trail Making B- 5 min, 2 errors, min assist needed for sequencing  Pt reported urge to have BM. In room, completed CGA sit > stand and ambulatory transfer with rollator > BSC but then changed her mind and wanted to go on standard toilet in bathroom for privacy. Pt left her rollator behind, with CGA/min A needed for ambulation due to imbalance and L lateral LOB despite pt furniture walking- anticipate due to urgency. Pt remained seated on toilet for BM attempt, with nurse made aware and call rope in hand.  Therapy  Documentation Precautions:  Precautions Precautions: Fall, Sternal Recall of Precautions/Restrictions: Impaired Precaution/Restrictions Comments: cortak Restrictions Weight Bearing Restrictions Per Provider Order: No Other Position/Activity Restrictions: Cardiac sternal precautions    Therapy/Group: Individual Therapy  [name], MS, OTR/L  03/22/2024, 3:33 PM

## 2024-03-22 NOTE — Progress Notes (Signed)
 Physical Therapy Session Note  Patient Details  Name: Tracy Baker MRN: [id number] Date of Birth: [birth year]  Today's Date: 03/22/2024 PT Individual Time: 9171-9163 and 8671-8645 PT Individual Time Calculation (min): 8 min and 26 min PT Missed Time: 26 minutes PT Missed Time Reason: Fatigue  Short Term Goals: Week 1:  PT Short Term Goal 1 (Week 1): pt will perform bed mobility with min A consistantly PT Short Term Goal 1 - Progress (Week 1): Met PT Short Term Goal 2 (Week 1): pt will perform bed<>chair transfers with LRAD and CGA PT Short Term Goal 2 - Progress (Week 1): Met PT Short Term Goal 3 (Week 1): pt will ambulate 7ft with LRAD and min A PT Short Term Goal 3 - Progress (Week 1): Met Week 2:  PT Short Term Goal 1 (Week 2): STG=LTG due to LOS  Skilled Therapeutic Interventions/Progress Updates:  Treatment Session 1  Received pt semi-reclined in bed. Pt reported not sleeping well again last night (MD and RN notified) and not expecting therapist (schedule changed and pt not aware). Encouraged OOB mobility and offered toileting (pt reported feeling urge to have BM) but pt politely declined and requested therapist come back later in the day. Discussed tasks that pt needs continued practice with (stairs and long distance ambulation). Will attempt to rearrange schedule as able.   Treatment Session 2 Received pt semi-reclined in bed on bedpan, requesting a few minutes of privacy. Allowed time and upon returning pt agreeable to PT treatment and denied any pain during session. Session with emphasis on functional mobility/transfers, generalized strengthening and endurance, dynamic standing balance/coordination, and ambulation. Pt transferred semi-reclined<>sitting R EOB with HOB elevated and use of bedrails with supervision and ++ time.  Pt performed all transfers with rollator and CGA throughout session (pt with good recall of brake safety). Pt ambulated ~77ft x 2 trials with rollator and  CGA to hold brief up. Pt took 1 seated rest break in between - pt politely refused leaving room and feeling exhausted after walking and requested to return to bed. Pt reported not sleeping and feeling constant urge to toilet - placed bedside commode next to bed to discourage use of bedpan. Encouraged pt to participate in last OT session, but pt responded with she's not going to get much out of me, I can tell you that. Transitioned into supine with supervision and concluded session with pt semi-reclined in bed with all needs within reach. 26 minutes missed of skilled physical therapy due to fatigue.   Therapy Documentation Precautions:  Precautions Precautions: Fall, Sternal Recall of Precautions/Restrictions: Impaired Precaution/Restrictions Comments: cortak Restrictions Weight Bearing Restrictions Per Provider Order: No Other Position/Activity Restrictions: Cardiac sternal precautions  Therapy/Group: Individual Therapy [name] [name] PT, DPT 03/22/2024, 6:50 AM

## 2024-03-23 DIAGNOSIS — R5381 Other malaise: Secondary | ICD-10-CM | POA: Diagnosis not present

## 2024-03-23 LAB — RENAL FUNCTION PANEL
Albumin: 4.1 g/dL (ref 3.5–5.0)
Anion gap: 17 — ABNORMAL HIGH (ref 5–15)
BUN: 50 mg/dL — ABNORMAL HIGH (ref 8–23)
CO2: 24 mmol/L (ref 22–32)
Calcium: 10.2 mg/dL (ref 8.9–10.3)
Chloride: 96 mmol/L — ABNORMAL LOW (ref 98–111)
Creatinine, Ser: 4.1 mg/dL — ABNORMAL HIGH (ref 0.44–1.00)
GFR, Estimated: 11 mL/min — ABNORMAL LOW
Glucose, Bld: 171 mg/dL — ABNORMAL HIGH (ref 70–99)
Phosphorus: 3.9 mg/dL (ref 2.5–4.6)
Potassium: 4.6 mmol/L (ref 3.5–5.1)
Sodium: 136 mmol/L (ref 135–145)

## 2024-03-23 LAB — MAGNESIUM: Magnesium: 1.9 mg/dL (ref 1.7–2.4)

## 2024-03-23 LAB — GLUCOSE, CAPILLARY
Glucose-Capillary: 94 mg/dL (ref 70–99)
Glucose-Capillary: 116 mg/dL — ABNORMAL HIGH (ref 70–99)

## 2024-03-23 MED ORDER — HYDROCORTISONE 1 % EX CREA
1.0000 | TOPICAL_CREAM | Freq: Two times a day (BID) | CUTANEOUS | Status: DC
  Administered 2024-03-23 – 2024-04-02 (×19): 1 via TOPICAL
  Filled 2024-03-23: qty 28

## 2024-03-23 MED ORDER — DIPHENHYDRAMINE HCL 50 MG/ML IJ SOLN
12.5000 mg | Freq: Once | INTRAMUSCULAR | Status: DC
  Filled 2024-03-23: qty 1

## 2024-03-23 MED ORDER — DIPHENHYDRAMINE HCL 50 MG/ML IJ SOLN
12.5000 mg | Freq: Three times a day (TID) | INTRAMUSCULAR | Status: DC | PRN
  Filled 2024-03-23: qty 1

## 2024-03-23 NOTE — Progress Notes (Signed)
 "                                                        PROGRESS NOTE   Subjective/Complaints:  No events overnight.  Vitals stable.  Patient concerned about worsening rash on her right neck and some itching over prior surgical/suture site.  On exam, has diffuse fine erythematous rash through her chest, back, and proximal arms.  She denies any itching or discomfort over these areas.  Has not had a rash like this before.  States she slept better with medication changes yesterday, although she did not use the clonazepam .  ROS:  Pt denies SOB, abd pain, CP, N/V/C/D, and vision changes +fatigue Positive for rash, right neck itching  Objective:   No results found.  No results for input(s): WBC, HGB, HCT, PLT in the last 72 hours.  Recent Labs    03/22/24 0916 03/23/24 1004  NA 135 136  K 4.6 4.6  CL 96* 96*  CO2 26 24  GLUCOSE 107* 171*  BUN 28* 50*  CREATININE 2.85* 4.10*  CALCIUM  9.6 10.2   No intake or output data in the 24 hours ending 03/23/24 2028     Wound 03/10/24 2051 Pressure Injury Sacrum Right Stage 2 -  Partial thickness loss of dermis presenting as a shallow open injury with a red, pink wound bed without slough. (Active)     Wound 03/15/24 1507 Pressure Injury Foot Left;Posterior Deep Tissue Pressure Injury - Purple or maroon localized area of discolored intact skin or blood-filled blister due to damage of underlying soft tissue from pressure and/or shear. (Active)    Physical Exam: Vital Signs Blood pressure (!) 142/80, pulse 85, temperature 98 F (36.7 C), resp. rate 18, height 5' 4 (1.626 m), weight 60.5 kg, SpO2 100%.  General: awake, alert, appropriate, NAD, laying in bed appears comfortable; vitals reviewed HENT: conjugate gaze; oropharynx dry CV: regular rate and rhythm currently; no JVD Pulmonary: CTA B/L; nonlabored GI: soft, NT, ND, (+)BS normoactive Extr: No LE edema. Psychiatric: appropriate, cooperative Neurological: Ox3  Skin:  has chest wall incision, tunneled catheter on left chest wall, right buttock wound and bilateral heel wounds--> left heel now with quarter sized stage II--covered   -1-18: Fine, erythematous, nonraised rash over neck, chest, back, and proximal extensor surfaces of arms.  No pustules, no warmth.  Neuro: Alert,4-/5 strength throughout, good attention, sensation is intact, +bilateral hand tremors, stable 1/18    Assessment/Plan: 1. Functional deficits which require 3+ hours per day of interdisciplinary therapy in a comprehensive inpatient rehab setting. Physiatrist is providing close team supervision and 24 hour management of active medical problems listed below. Physiatrist and rehab team continue to assess barriers to discharge/monitor patient progress toward functional and medical goals  Care Tool:  Bathing    Body parts bathed by patient: Right arm, Left arm, Chest, Abdomen, Front perineal area, Left upper leg, Right upper leg, Face   Body parts bathed by helper: Buttocks, Right lower leg, Left lower leg     Bathing assist Assist Level: Moderate Assistance - Patient 50 - 74% (Min to ModA)     Upper Body Dressing/Undressing Upper body dressing   What is the patient wearing?: Pull over shirt    Upper body assist Assist Level: Supervision/Verbal cueing    Lower Body Dressing/Undressing  Lower body dressing      What is the patient wearing?: Incontinence brief, Pants     Lower body assist Assist for lower body dressing: Minimal Assistance - Patient > 75%     Toileting Toileting    Toileting assist Assist for toileting: Minimal Assistance - Patient > 75%     Transfers Chair/bed transfer  Transfers assist  Chair/bed transfer activity did not occur: Safety/medical concerns (pain, fatigue, dizziness)  Chair/bed transfer assist level: Contact Guard/Touching assist     Locomotion Ambulation   Ambulation assist   Ambulation activity did not occur: Safety/medical  concerns (pain, fatigue, dizziness)  Assist level: Contact Guard/Touching assist Assistive device: Walker-rolling Max distance: 56ft   Walk 10 feet activity   Assist  Walk 10 feet activity did not occur: Safety/medical concerns (pain, fatigue, dizziness)  Assist level: Minimal Assistance - Patient > 75% Assistive device: Walker-rolling   Walk 50 feet activity   Assist Walk 50 feet with 2 turns activity did not occur: Safety/medical concerns (pain, fatigue, dizziness)         Walk 150 feet activity   Assist Walk 150 feet activity did not occur: Safety/medical concerns (pain, fatigue, dizziness)         Walk 10 feet on uneven surface  activity   Assist Walk 10 feet on uneven surfaces activity did not occur: Safety/medical concerns (pain, fatigue, dizziness)   Assist level: Contact Guard/Touching assist Assistive device: Rollator   Wheelchair     Assist Is the patient using a wheelchair?: Yes Type of Wheelchair: Manual Wheelchair activity did not occur: Safety/medical concerns (pain, fatigue, dizziness)         Wheelchair 50 feet with 2 turns activity    Assist    Wheelchair 50 feet with 2 turns activity did not occur: Safety/medical concerns (pain, fatigue, dizziness)       Wheelchair 150 feet activity     Assist  Wheelchair 150 feet activity did not occur: Safety/medical concerns (pain, fatigue, dizziness)       Blood pressure (!) 142/80, pulse 85, temperature 98 F (36.7 C), resp. rate 18, height 5' 4 (1.626 m), weight 60.5 kg, SpO2 100%.  Medical Problem List and Plan: 1. Functional deficits secondary to debility after prolonged hospital stay related to mitral valve vegetations and MV repair and ultimate replacement.             -patient may  shower             -ELOS/Goals: 15 days, supervision goals with PT, OT, SLP  Con't CIR PT, OT and SLP Continue Eliquis  Will call son to update him today Full code but no chest  compressions -Message from SW- daughter requesting call-daughter would prefer patient stay longer than current discharge date-Will defer decision until team conference tomorrow and Dr. Lorilee will be there tomorrow.  After team meeting daughter requests somebody call her to let her know regarding decision, if she does not pick up she would like message to be left and then she can call back. Discussed SNF placement  2. Hypotension: medications reviewed and is on amiodarone , d/c clonazepam , continue compression garments, continue midodrine  10mg  TID, continue abdominal binder, discussed with patient and son that this is likely to be worst post-dialysis  3. Pain: A) Bilateral heel pain: d/ced Lyrica  because this pain improves B) Right ankle pain: ASO ordered, placed nursing for heat application in the morning for stiffness and ice in the afternoon to reduce inflammation. XR reviewed and is stable  4. Insomnia: continue Remeron , scheduled melatonin        - 1-17: Poor response to measures as above.  Increase melatonin to 5 mg, reviewed chart and confirmed patient with home medication clonazepam , will allow very low-dose 0.25 mg nightly as needed  1-18: Slept better with increased melatonin, did not use as needed clonazepam .  Continue regimen.  5. Neuropsych/cognition: This patient is capable of making decisions on her own behalf.  6. Skin/Wound Care: Air mattress overlay for pressure relief.             --continue Ensure and prosource             --added Vitamin C  and Zinc to promote wound healing.   1/11 left heel appears to be evolution of what was present on admit   -continue above, prevalons   -discussed with pt importance of pressure relief, nutrition  1-18: Hydrocortisone  1% cream and one-time dose IV Benadryl  for rash; patient refuses Benadryl  due to not wanting IV be placed, unfortunately allergic to dye in of both capsule and liquid Benadryl .  No obvious recent medication changes or  exposures.  Did tell her to switch from a paper to a cloth gown to reduce irritation.  7. Decreased appetite: discussed with patient that she feels this is improving, will d/c cortrak, discussed that family is bringing foods from home NO FR needed per  nephrology.    -appetite/intake is picking up.  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             --F/u with ID 01/06?-->discussed with Dr. Overton who will follow up with for input.   9. 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   10. AKI with dialysis initiated 02/16/24: On HD MWF at the end of the day to help with tolerance of therapy             Cr reviewed and is improving             --CLIP on hold. Needs to be able to sit up in a chair for HD.    11. A fib: Monitor HR TID--continue amiodarone  and eliquis .   12. Anemia of critical illness:Being monitored and stable.              --thrombocytopenia has resolved.   13. Urinary urgency: UA ordered 1/9, PVRs ordered, f/u UA  1/11 UA +, need UCX   -begin empiric bactrim  ss bid  1/12- Pt's upset has UTI- why do I have one at all- and thought she was already on ABX to prevent UTI- explained she was on antifungals, not ABX currently.  -1/13 urine culture with Enterobacter cloaca sensitive to Bactrim , continue current regimen, completed 3 day course  14. Daytime fatigue:  -continue D3 daily B12 ordered (one time injection given red dye allergy), d/c seroquel , change lyrica  to HS, decreased therapy to 15/7, d/c clonazepam  at night, discussed that remeron  could contribute but will maintain for now given insomnia and to help stimulate appetite, melatonin scheduled at night to help with insomnia  15. Hyponatremia: Na level reviewed and this improved, continue to monitor Na levels with HD  16. Bilateral hand tremors: liver enzymes reviewed and are wnl    LOS: 13 days A FACE TO FACE EVALUATION WAS  PERFORMED  Joesph JAYSON Likes 03/23/2024, 8:28 PM     "

## 2024-03-23 NOTE — Progress Notes (Signed)
 IVT consult placed for med administration though an internal jugular. Per documentation, the line is a tunneled HD cath with no pigtail. [place] sent to RN the HD cath cannot be used for anything but dialysis. Pt will need a PIV for med administration.

## 2024-03-23 NOTE — Progress Notes (Signed)
 Occupational Therapy Session Note  Patient Details  Name: Tracy Baker MRN: [id number] Date of Birth: Oct 31, 1955  Today's Date: 03/23/2024 OT Individual Time: 9199-9154 OT Individual Time Calculation (min): 45 min    Short Term Goals: Week 1:  OT Short Term Goal 1 (Week 1): Pt will complete sit > stand with CGA using LRAD OT Short Term Goal 1 - Progress (Week 1): Met OT Short Term Goal 2 (Week 1): Pt will complete toilet transfer with min A using LRAD OT Short Term Goal 2 - Progress (Week 1): Met OT Short Term Goal 3 (Week 1): Pt will complete 2/3 toileting steps with CGA for balance OT Short Term Goal 3 - Progress (Week 1): Met OT Short Term Goal 4 (Week 1): Pt will adhere to sternal precautions 100% for 2 consecutive sessions with supervision OT Short Term Goal 4 - Progress (Week 1): Progressing toward goal  Skilled Therapeutic Interventions/Progress Updates:   Initial Encounter:   Patient in bed at the time of arrival indicating that her ears were filed with water  secondary to washing her hair on yesterday. The pt indicated that she rested okay during the night with no pain to report. The pt was in agreement with completing BADL related task for starting her day.  UB Exercise:  The pt was able to come from supine in bed to EOB with closeS, The pt was able to sit EOB secondary to experiencing dizziness.  The pt was able to ambulate to the sink with CGA using the RW. The pt was able to transition to sitting , using the arm of the w/c and the RW with closeS for placement at w/c LOF. The pt was able to doff her over head shirt with closeS  The pt was able to bathe her UB with s/uA. The pt was able to donn her over head hospital top with closeS. The pt was able to doff her LB garments, a brief and hospital pants with MinA .  The pt was able to bathe her LB with closeS. The pt was able to donn her brief with  ModA and MinA for her hospital pants. The pt was transporte to  the bed, and was able  to come from sit to stand using the bed rail and the arm of the w/c for placement EOB with CGA. The pt was able to transfer from EOB to supine with closeS.  Prior to exiting the room, the call light and bed side table were placed within reach with all additional needs addressed.   Therapy Documentation Precautions:  Precautions Precautions: Fall, Sternal Recall of Precautions/Restrictions: Impaired Precaution/Restrictions Comments: cortak Restrictions Weight Bearing Restrictions Per Provider Order: No Other Position/Activity Restrictions: Cardiac sternal precautions   Therapy/Group: Individual Therapy  Elvera JONETTA Mace 03/23/2024, 12:46 PM

## 2024-03-23 NOTE — Progress Notes (Signed)
 Physical Therapy Session Note  Patient Details  Name: Tracy Baker MRN: [id number] Date of Birth: 1955-06-09  Today's Date: 03/23/2024 PT Individual Time: 1000-1043 PT Individual Time Calculation (min): 43 min   Today's Date: 03/23/2024 PT Individual Time: 8699-8657 PT Individual Time Calculation (min): 42 min   Short Term Goals: Week 2:  PT Short Term Goal 1 (Week 2): STG=LTG due to LOS  Skilled Therapeutic Interventions/Progress Updates:     Treatment 1: Pt semi-reclined in bed upon arrival. Pt denies pain and agreeable to therapy. Pt reports increased redness on R side of neck and increased bruising on her L side of neck and chest - notified nursing who came to assess pt during session regarding these concerns. Session emphasized functional LE strengthening and mobility, endurance/activity tolerance, and ambulation. Pt performed supine to sitting EOB with S and VC for sternal precautions. Pt performed sit to stand to rollator with CGA. Pt amb room to ortho gym ~80 ft total (30 ft, 30 ft, and 20 ft bouts) using rollator and CGA, however, pt required 2 seated rest breaks on rollator due to B LE fatigue. Pt able to push rollator against wall, lock brakes, and sit to rollator seat safety with S. Pt utilized NuStep for B LE strengthening and endurance x 15 min with prolonged rest break after 10 min and brief rest breaks throughout due to fatigue. Pt amb back to room same distance/assist as before, only requiring 1 seated rest break this time. Pt returned to supine in bed with all needs in reach at end of session.  Treatment 2: Pt semi-reclined in bed upon arrival. Pt denies pain and agreeable to therapy only if she can remain in her room. Session emphasized B LE strengthening and endurance as well as transfers. Pt sat to EOB with S. Pt instructed in and participated in the following seated B LE strengthening exercises: 3x10 heel/toe lifts, 3x10 LAQ, 3x10 alt marching. Pt requested to void. Pt  performed short distance amb transfer to [facility] over toilet using rollator with SBA. Pt performed toilet hygiene and without assist and and clothing management with min A and SBA for balance. Pt required VC to maintain close proximity to rollator prior to sitting down to EOB and BSC. Pt stated she needed to lie back down due to B LE fatigue. Pt agreed to supine exercises. Pt instructed in and performed the following hook lying B LE exercises: 3x10 hip abduction with YTB around knees, 3x10 hip adduction pillow squeezes, 3x10 alt marching, and 3x10 heel slides. Rest breaks required throughout session due to B LE fatigue. Pt remained supine with all needs in reach at end of session.  Therapy Documentation Precautions:  Precautions Precautions: Fall, Sternal Recall of Precautions/Restrictions: Impaired Precaution/Restrictions Comments: cortak Restrictions Weight Bearing Restrictions Per Provider Order: No Other Position/Activity Restrictions: Cardiac sternal precautions  Therapy/Group: Individual Therapy  Comer CHRISTELLA Levora Comer Levora, PT, DPT 03/23/2024, 8:41 AM

## 2024-03-23 NOTE — Plan of Care (Signed)
" °  Problem: Consults Goal: RH GENERAL PATIENT EDUCATION Description: See Patient Education module for education specifics. Outcome: Progressing Goal: Skin Care Protocol Initiated - if Braden Score 18 or less Description: If consults are not indicated, leave blank or document N/A Outcome: Progressing Goal: Nutrition Consult-if indicated Outcome: Progressing Goal: Diabetes Guidelines if Diabetic/Glucose > 140 Description: If diabetic or lab glucose is > 140 mg/dl - Initiate Diabetes/Hyperglycemia Guidelines & Document Interventions  Outcome: Progressing   Problem: RH BOWEL ELIMINATION Goal: RH STG MANAGE BOWEL WITH ASSISTANCE Description: STG Manage Bowel with mod I  Assistance. Outcome: Progressing Goal: RH STG MANAGE BOWEL W/MEDICATION W/ASSISTANCE Description: STG Manage Bowel with Medication with mod I Assistance. Outcome: Progressing   Problem: RH SKIN INTEGRITY Goal: RH STG SKIN FREE OF INFECTION/BREAKDOWN Description: Manage skin w min assist Outcome: Progressing Goal: RH STG MAINTAIN SKIN INTEGRITY WITH ASSISTANCE Description: STG Maintain Skin Integrity With Assistance. Outcome: Progressing Goal: RH STG ABLE TO PERFORM INCISION/WOUND CARE W/ASSISTANCE Description: STG Able To Perform Incision/Wound Care With min Assistance. Outcome: Progressing   Problem: RH SAFETY Goal: RH STG ADHERE TO SAFETY PRECAUTIONS W/ASSISTANCE/DEVICE Description: STG Adhere to Safety Precautions With cues Assistance/Device. Outcome: Progressing   Problem: RH PAIN MANAGEMENT Goal: RH STG PAIN MANAGED AT OR BELOW PT'S PAIN GOAL Description: Pain < 4 with prns Outcome: Progressing   Problem: RH KNOWLEDGE DEFICIT GENERAL Goal: RH STG INCREASE KNOWLEDGE OF SELF CARE AFTER HOSPITALIZATION Description: Patient and family will be able to manage care at discharge using educational resources for medication, skin care and dietary modification independently Outcome: Progressing   "

## 2024-03-24 DIAGNOSIS — R5381 Other malaise: Secondary | ICD-10-CM | POA: Diagnosis not present

## 2024-03-24 LAB — RENAL FUNCTION PANEL
Albumin: 3.7 g/dL (ref 3.5–5.0)
Anion gap: 16 — ABNORMAL HIGH (ref 5–15)
BUN: 63 mg/dL — ABNORMAL HIGH (ref 8–23)
CO2: 22 mmol/L (ref 22–32)
Calcium: 9.7 mg/dL (ref 8.9–10.3)
Chloride: 100 mmol/L (ref 98–111)
Creatinine, Ser: 4.55 mg/dL — ABNORMAL HIGH (ref 0.44–1.00)
GFR, Estimated: 10 mL/min — ABNORMAL LOW
Glucose, Bld: 105 mg/dL — ABNORMAL HIGH (ref 70–99)
Phosphorus: 4.5 mg/dL (ref 2.5–4.6)
Potassium: 4.4 mmol/L (ref 3.5–5.1)
Sodium: 138 mmol/L (ref 135–145)

## 2024-03-24 LAB — URINALYSIS, ROUTINE W REFLEX MICROSCOPIC
Bilirubin Urine: NEGATIVE
Glucose, UA: NEGATIVE mg/dL
Hgb urine dipstick: NEGATIVE
Ketones, ur: NEGATIVE mg/dL
Nitrite: NEGATIVE
Protein, ur: 100 mg/dL — AB
Specific Gravity, Urine: 1.016 (ref 1.005–1.030)
pH: 5 (ref 5.0–8.0)

## 2024-03-24 LAB — GLUCOSE, CAPILLARY
Glucose-Capillary: 100 mg/dL — ABNORMAL HIGH (ref 70–99)
Glucose-Capillary: 93 mg/dL (ref 70–99)

## 2024-03-24 LAB — CBC
HCT: 29.7 % — ABNORMAL LOW (ref 36.0–46.0)
Hemoglobin: 9.7 g/dL — ABNORMAL LOW (ref 12.0–15.0)
MCH: 32.6 pg (ref 26.0–34.0)
MCHC: 32.7 g/dL (ref 30.0–36.0)
MCV: 99.7 fL (ref 80.0–100.0)
Platelets: 148 K/uL — ABNORMAL LOW (ref 150–400)
RBC: 2.98 MIL/uL — ABNORMAL LOW (ref 3.87–5.11)
RDW: 17.5 % — ABNORMAL HIGH (ref 11.5–15.5)
WBC: 5.8 K/uL (ref 4.0–10.5)
nRBC: 0 % (ref 0.0–0.2)

## 2024-03-24 LAB — MAGNESIUM: Magnesium: 2.1 mg/dL (ref 1.7–2.4)

## 2024-03-24 MED ORDER — HEPARIN SODIUM (PORCINE) 1000 UNIT/ML IJ SOLN
4200.0000 [IU] | Freq: Once | INTRAMUSCULAR | Status: AC
  Administered 2024-03-24: 4200 [IU]

## 2024-03-24 MED ORDER — MIRTAZAPINE 15 MG PO TABS
15.0000 mg | ORAL_TABLET | Freq: Every day | ORAL | Status: DC
  Administered 2024-03-24: 15 mg via ORAL
  Filled 2024-03-24: qty 1

## 2024-03-24 MED ORDER — ALUM & MAG HYDROXIDE-SIMETH 200-200-20 MG/5ML PO SUSP: 15.0000 mL | Freq: Four times a day (QID) | ORAL | Status: DC | PRN

## 2024-03-24 MED ORDER — MIDODRINE HCL 5 MG PO TABS
ORAL_TABLET | ORAL | Status: AC
  Filled 2024-03-24: qty 2

## 2024-03-24 NOTE — Progress Notes (Signed)
 Patient ID: Tracy Baker, female   DOB: 1955-06-27, 69 y.o.   MRN: [id number] [facility] Progress Note   Assessment/ Plan:   1. Acute kidney Injury: Remains anuric and without any evidence of renal recovery.  Has been on dialysis since 02/16/2024 and process underway for outpatient dialysis unit placement with acute kidney injury (possibly [facility]).  Interdialytic labs without any evidence of renal recovery.  Status post left IJ TDC by IR on 12/22.  Continue hemodialysis while here on MWF schedule.   2.  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  Currently on fluconazole  with prolonged therapeutic duration noted. 3. Hypotension: Blood pressures with decent control on midodrine  10 mg 3 times a day. 4.  Anemia: Will check hemoglobin/hematocrit with labs on hemodialysis today.  Subjective:   Without acute events overnight, continues to feel stronger   Objective:   BP 133/68 (BP Location: Left Arm)   Pulse 66   Temp 98.2 F (36.8 C)   Resp 17   Ht 5' 4 (1.626 m)   Wt 60.4 kg   SpO2 98%   BMI 22.86 kg/m   Intake/Output Summary (Last 24 hours) at 03/24/2024 1127 Last data filed at 03/24/2024 0732 Gross per 24 hour  Intake 236 ml  Output --  Net 236 ml   Weight change: -0.11 kg  Physical Exam: Gen: Seen while participating in physical therapy-on a exercise bike CVS: Pulse regular rhythm, normal rate, S1 and S2 normal Resp: Clear to auscultation, no rales/rhonchi.  Right IJ TDC in place Abd: Soft, flat, nontender, bowel sounds normal Ext: No lower extremity edema.  Imaging: No results found.  Labs: BMET Recent Labs  Lab 03/18/24 0517 03/19/24 0456 03/20/24 0519 03/21/24 0532 03/22/24 0916 03/23/24 1004 03/24/24 0539  NA 136 138 136  136 136 135 136 138  K 4.2 4.4 4.4  4.2 4.5 4.6 4.6 4.4  CL 97* 98 96*  97* 98 96* 96* 100  CO2 28 25 27  28 24 26 24 22   GLUCOSE 99 94 84  86 90 107* 171* 105*   BUN 29* 46* 22  22 35* 28* 50* 63*  CREATININE 2.93* 4.35* 2.53*  2.55* 3.55* 2.85* 4.10* 4.55*  CALCIUM  9.1 9.5 9.1  9.1 9.6 9.6 10.2 9.7  PHOS  --  4.7* 2.8 4.1 3.2 3.9 4.5   CBC No results for input(s): WBC, NEUTROABS, HGB, HCT, MCV, PLT in the last 168 hours.  Medications:     amiodarone   200 mg Oral Daily   apixaban   5 mg Oral BID   ascorbic acid   500 mg Oral Q supper   cholecalciferol   1,000 Units Oral Daily   fluconazole   400 mg Oral Q supper   Followed by   NOREEN ON 05/05/2024] fluconazole   200 mg Oral Daily   Gerhardt's butt cream   Topical BID   hydrocerin   Topical BID   hydrocortisone  cream  1 Application Topical BID   megestrol   80 mg Oral BID   melatonin  5 mg Oral QHS   midodrine   10 mg Oral TID WC   mirtazapine   30 mg Oral QHS   mouth rinse  15 mL Mouth Rinse 4 times per day   pantoprazole   40 mg Oral Daily   sertraline   25 mg Oral QHS    Gordy Blanch, MD 03/24/2024, 11:27 AM

## 2024-03-24 NOTE — Progress Notes (Signed)
 Left a voicemail with [name] to provide an update about SNF referrals. She was informed about the 4 acceptances that she had and they were not preference. SW also informed [name] that per therapists and MD, patient is making progress and home may be a better opportunity rather than SNF placement. SW left a callback number.

## 2024-03-24 NOTE — Progress Notes (Signed)
 "                                                        PROGRESS NOTE   Subjective/Complaints: Feeling very well this morning Ambulated very well with therapy this morning Sending message to team to discuss whether she still needs SNF  ROS:  Pt denies SOB, abd pain, CP, N/V/C/D, and vision changes +fatigue   Objective:   No results found.  No results for input(s): WBC, HGB, HCT, PLT in the last 72 hours.  Recent Labs    03/23/24 1004 03/24/24 0539  NA 136 138  K 4.6 4.4  CL 96* 100  CO2 24 22  GLUCOSE 171* 105*  BUN 50* 63*  CREATININE 4.10* 4.55*  CALCIUM  10.2 9.7    Intake/Output Summary (Last 24 hours) at 03/24/2024 1139 Last data filed at 03/24/2024 0732 Gross per 24 hour  Intake 236 ml  Output --  Net 236 ml       Wound 03/10/24 2051 Pressure Injury Sacrum Right Stage 2 -  Partial thickness loss of dermis presenting as a shallow open injury with a red, pink wound bed without slough. (Active)     Wound 03/15/24 1507 Pressure Injury Foot Left;Posterior Deep Tissue Pressure Injury - Purple or maroon localized area of discolored intact skin or blood-filled blister due to damage of underlying soft tissue from pressure and/or shear. (Active)    Physical Exam: Vital Signs Blood pressure 133/68, pulse 66, temperature 98.2 F (36.8 C), resp. rate 17, height 5' 4 (1.626 m), weight 60.4 kg, SpO2 98%.  General: awake, alert, appropriate, NAD, laying in bed appears comfortable; vitals reviewed HENT: conjugate gaze; oropharynx dry CV: regular rate and rhythm currently; no JVD Pulmonary: CTA B/L; nonlabored GI: soft, NT, ND, (+)BS normoactive Extr: No LE edema. Psychiatric: appropriate, cooperative Neurological: Ox3  Skin: has chest wall incision, tunneled catheter on left chest wall, right buttock wound and bilateral heel wounds--> left heel now with quarter sized stage II--covered   Fine, erythematous, nonraised rash over neck, chest, back, and  proximal extensor surfaces of arms.  No pustules, no warmth.  Neuro: Alert,4-/5 strength throughout, good attention, sensation is intact, +bilateral hand tremors, stable 1/19    Assessment/Plan: 1. Functional deficits which require 3+ hours per day of interdisciplinary therapy in a comprehensive inpatient rehab setting. Physiatrist is providing close team supervision and 24 hour management of active medical problems listed below. Physiatrist and rehab team continue to assess barriers to discharge/monitor patient progress toward functional and medical goals  Care Tool:  Bathing    Body parts bathed by patient: Right arm, Left arm, Chest, Abdomen, Front perineal area, Left upper leg, Right upper leg, Face   Body parts bathed by helper: Buttocks, Right lower leg, Left lower leg     Bathing assist Assist Level: Moderate Assistance - Patient 50 - 74% (Min to ModA)     Upper Body Dressing/Undressing Upper body dressing   What is the patient wearing?: Pull over shirt    Upper body assist Assist Level: Supervision/Verbal cueing    Lower Body Dressing/Undressing Lower body dressing      What is the patient wearing?: Incontinence brief, Pants     Lower body assist Assist for lower body dressing: Minimal Assistance - Patient > 75%  Toileting Toileting    Toileting assist Assist for toileting: Minimal Assistance - Patient > 75%     Transfers Chair/bed transfer  Transfers assist  Chair/bed transfer activity did not occur: Safety/medical concerns (pain, fatigue, dizziness)  Chair/bed transfer assist level: Contact Guard/Touching assist     Locomotion Ambulation   Ambulation assist   Ambulation activity did not occur: Safety/medical concerns (pain, fatigue, dizziness)  Assist level: Contact Guard/Touching assist Assistive device: Rollator Max distance: 126   Walk 10 feet activity   Assist  Walk 10 feet activity did not occur: Safety/medical concerns (pain,  fatigue, dizziness)  Assist level: Contact Guard/Touching assist Assistive device: Rollator   Walk 50 feet activity   Assist Walk 50 feet with 2 turns activity did not occur: Safety/medical concerns (pain, fatigue, dizziness)  Assist level: Contact Guard/Touching assist Assistive device: Rollator    Walk 150 feet activity   Assist Walk 150 feet activity did not occur: Safety/medical concerns (fatigue)         Walk 10 feet on uneven surface  activity   Assist Walk 10 feet on uneven surfaces activity did not occur: Safety/medical concerns (pain, fatigue, dizziness)   Assist level: Contact Guard/Touching assist Assistive device: Rollator   Wheelchair     Assist Is the patient using a wheelchair?: Yes Type of Wheelchair: Manual Wheelchair activity did not occur: Safety/medical concerns (pain, fatigue, dizziness)         Wheelchair 50 feet with 2 turns activity    Assist    Wheelchair 50 feet with 2 turns activity did not occur: Safety/medical concerns (pain, fatigue, dizziness)       Wheelchair 150 feet activity     Assist  Wheelchair 150 feet activity did not occur: Safety/medical concerns (pain, fatigue, dizziness)       Blood pressure 133/68, pulse 66, temperature 98.2 F (36.8 C), resp. rate 17, height 5' 4 (1.626 m), weight 60.4 kg, SpO2 98%.  Medical Problem List and Plan: 1. Functional deficits secondary to debility after prolonged hospital stay related to mitral valve vegetations and MV repair and ultimate replacement.             -patient may  shower             -ELOS/Goals: 15 days, supervision goals with PT, OT, SLP  Con't CIR PT, OT and SLP Continue Eliquis  Full code but no chest compressions Will call son to update regarding her progress Discussed SNF placement  2. Hypotension: medications reviewed and is on amiodarone , d/c clonazepam , continue compression garments, continue midodrine  10mg  TID, continue abdominal binder,  discussed with patient and son that this is likely to be worst post-dialysis  3. Pain: A) Bilateral heel pain: d/ced Lyrica  because this pain improves B) Right ankle pain: ASO ordered, placed nursing for heat application in the morning for stiffness and ice in the afternoon to reduce inflammation. XR reviewed and is stable  4. Insomnia: continue Remeron , scheduled melatonin        - 1-17: Poor response to measures as above.  Increase melatonin to 5 mg, reviewed chart and confirmed patient with home medication clonazepam , will allow very low-dose 0.25 mg nightly as needed  1-18: Slept better with increased melatonin, did not use as needed clonazepam .  Continue regimen.  5. Neuropsych/cognition: This patient is capable of making decisions on her own behalf.  6. Skin/Wound Care: Air mattress overlay for pressure relief.             --continue Ensure and  prosource             --added Vitamin C  and Zinc to promote wound healing.   1/11 left heel appears to be evolution of what was present on admit   -continue above, prevalons   -discussed with pt importance of pressure relief, nutrition  1-18: Hydrocortisone  1% cream and one-time dose IV Benadryl  for rash; patient refuses Benadryl  due to not wanting IV be placed, unfortunately allergic to dye in of both capsule and liquid Benadryl .  No obvious recent medication changes or exposures.  Did tell her to switch from a paper to a cloth gown to reduce irritation.  7. Decreased appetite: discussed with patient that she feels this is improving, will d/c cortrak, discussed that family is bringing foods from home NO FR needed per  nephrology.    -appetite/intake is picking up.  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             --F/u with ID 01/06?-->discussed with Dr. Overton who will follow up with for input.   9. 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   10. AKI with  dialysis initiated 02/16/24: On HD MWF at the end of the day to help with tolerance of therapy             Cr reviewed and is improving             --CLIP on hold. Needs to be able to sit up in a chair for HD.    11. A fib: Monitor HR TID--continue amiodarone  and eliquis .   12. Anemia of critical illness:Being monitored and stable.              --thrombocytopenia has resolved.   13. Urinary urgency: UA ordered 1/9, PVRs ordered, f/u UA  1/11 UA +, need UCX   -begin empiric bactrim  ss bid  1/12- Pt's upset has UTI- why do I have one at all- and thought she was already on ABX to prevent UTI- explained she was on antifungals, not ABX currently.  -1/13 urine culture with Enterobacter cloaca sensitive to Bactrim , continue current regimen, completed 3 day course  14. Daytime fatigue:  continue D3 daily B12 ordered (one time injection given red dye allergy), d/c seroquel , change lyrica  to HS, decreased therapy to 15/7, d/c clonazepam  at night, discussed that remeron  could contribute but will maintain for now given insomnia and to help stimulate appetite, melatonin scheduled at night to help with insomnia  15. Hyponatremia: Na level reviewed and this improved, continue to monitor Na levels with HD  16. Bilateral hand tremors: liver enzymes reviewed and are wnl, discussed that these seem to worsen when patient is anxious    LOS: 14 days A FACE TO FACE EVALUATION WAS PERFORMED  Sven P Shantai Tiedeman 03/24/2024, 11:39 AM     "

## 2024-03-24 NOTE — Plan of Care (Signed)
" °  Problem: Consults Goal: RH GENERAL PATIENT EDUCATION Description: See Patient Education module for education specifics. Outcome: Progressing Goal: Skin Care Protocol Initiated - if Braden Score 18 or less Description: If consults are not indicated, leave blank or document N/A Outcome: Progressing Goal: Nutrition Consult-if indicated Outcome: Progressing Goal: Diabetes Guidelines if Diabetic/Glucose > 140 Description: If diabetic or lab glucose is > 140 mg/dl - Initiate Diabetes/Hyperglycemia Guidelines & Document Interventions  Outcome: Progressing   Problem: RH BOWEL ELIMINATION Goal: RH STG MANAGE BOWEL WITH ASSISTANCE Description: STG Manage Bowel with mod I  Assistance. Outcome: Progressing Goal: RH STG MANAGE BOWEL W/MEDICATION W/ASSISTANCE Description: STG Manage Bowel with Medication with mod I Assistance. Outcome: Progressing   Problem: RH SKIN INTEGRITY Goal: RH STG SKIN FREE OF INFECTION/BREAKDOWN Description: Manage skin w min assist Outcome: Progressing Goal: RH STG MAINTAIN SKIN INTEGRITY WITH ASSISTANCE Description: STG Maintain Skin Integrity With Assistance. Outcome: Progressing Goal: RH STG ABLE TO PERFORM INCISION/WOUND CARE W/ASSISTANCE Description: STG Able To Perform Incision/Wound Care With min Assistance. Outcome: Progressing   Problem: RH SAFETY Goal: RH STG ADHERE TO SAFETY PRECAUTIONS W/ASSISTANCE/DEVICE Description: STG Adhere to Safety Precautions With cues Assistance/Device. Outcome: Progressing   Problem: RH PAIN MANAGEMENT Goal: RH STG PAIN MANAGED AT OR BELOW PT'S PAIN GOAL Description: Pain < 4 with prns Outcome: Progressing   Problem: RH KNOWLEDGE DEFICIT GENERAL Goal: RH STG INCREASE KNOWLEDGE OF SELF CARE AFTER HOSPITALIZATION Description: Patient and family will be able to manage care at discharge using educational resources for medication, skin care and dietary modification independently Outcome: Progressing   "

## 2024-03-24 NOTE — Progress Notes (Signed)
 Received patient in bed to unit.  Alert and oriented.  Informed consent signed and in chart.   TX duration:3 hours and 45 minutes  Patient tolerated well.  Transported back to the room  Alert, without acute distress.  Hand-off given to patient's nurse.   Access used: Left Upper Chest HD cath Access issues: none  Total UF removed: 2L Medication(s) given: midodrine    Camellia Brasil LPN [facility]

## 2024-03-24 NOTE — Progress Notes (Signed)
 Occupational Therapy Session Note  Patient Details  Name: Tracy Baker MRN: [id number] Date of Birth: 01/01/1956  Today's Date: 03/24/2024 OT Individual Time: 8984-8884 OT Individual Time Calculation (min): 60 min    Short Term Goals: Week 1:  OT Short Term Goal 1 (Week 1): Pt will complete sit > stand with CGA using LRAD OT Short Term Goal 1 - Progress (Week 1): Met OT Short Term Goal 2 (Week 1): Pt will complete toilet transfer with min A using LRAD OT Short Term Goal 2 - Progress (Week 1): Met OT Short Term Goal 3 (Week 1): Pt will complete 2/3 toileting steps with CGA for balance OT Short Term Goal 3 - Progress (Week 1): Met OT Short Term Goal 4 (Week 1): Pt will adhere to sternal precautions 100% for 2 consecutive sessions with supervision OT Short Term Goal 4 - Progress (Week 1): Progressing toward goal Week 2:  OT Short Term Goal 1 (Week 2): STG = LTG due to ELOS      Skilled Therapeutic Interventions/Progress Updates:    Pt received in bed anxious to get to the toilet. Pt able to get OOB with 1 cue to not push up with her arms and ambulate with 4WW to elevated toilet seat with S.  Once in the bathroom , she toileted without A or S needed.   She then ambulated to the sink to wash her hands and then sat in w/c.  She reached behind her to grab something off her tray reaching out of sternal precaution range.  I checked with her cardiology PA to see if she still has sternal precautions as it has been over 6 wks.  PA stated no sternal precautions but if she has pain with movement to keep them for 2 more weeks.  I asked Pam, the inpatient PA,  to change the order but not changed yet.   Still no lifting over 10 pounds for a full 3 months from surgery.   Pt declined need to bathe today. Spent time reviewing her progress and what she needs to continue to work on here and in the SNF to eventually return home.  Pt agreeable but somewhat nervous to engage in light arm exercises with a 2 lb  dowel bar.  Encouraged pt to lift arms to a level she felt comfortable with which was about 60 degrees of sh flexion. Pt also engaged in bicep flexion.  Without bar, single arm overhead reaching to pt's comfort level of about 100 degrees of flexion.   Pt resting in wc with all needs met.  Therapy Documentation Precautions:  Precautions Precautions: Fall, Sternal Recall of Precautions/Restrictions: Impaired Precaution/Restrictions Comments: cortak Restrictions Weight Bearing Restrictions Per Provider Order: No Other Position/Activity Restrictions: Cardiac sternal precautions     Pain: Pain Assessment Pain Scale: 0-10 Pain Score: 0-No pain ADL: ADL Toileting: Setup Where Assessed-Toileting: Toilet, Psychiatrist Transfer: Close supervision Toilet Transfer Method: Proofreader: Raised toilet seat     Therapy/Group: Individual Therapy  Skyleigh Windle 03/24/2024, 12:35 PM

## 2024-03-24 NOTE — Progress Notes (Signed)
" °   03/24/24 1745  Vitals  Temp 98.2 F (36.8 C)  Temp Source Oral  BP 106/73  MAP (mmHg) 84  Pulse Rate 95  ECG Heart Rate 95  Resp 19  Weight 68.5 kg  Type of Weight Post-Dialysis  Oxygen Therapy  SpO2 98 %  O2 Device Room Air  During Treatment Monitoring  Duration of HD Treatment -hour(s) 3.75 hour(s)  HD Safety Checks Performed Yes  Intra-Hemodialysis Comments Tx completed  Post Treatment  Dialyzer Clearance Lightly streaked  Liters Processed 90  Fluid Removed (mL) 2000 mL  Tolerated HD Treatment Yes  Hemodialysis Catheter Left Internal jugular Double lumen Permanent (Tunneled)  Placement Date/Time: 02/25/24 1348   Serial / Lot #: [id number]  Expiration Date: 08/03/28  Time Out: Correct patient;Correct site;Correct procedure  Maximum sterile barrier precautions: Hand hygiene;Cap;Mask;Sterile gown;Sterile gloves;Large sterile s...  Site Condition No complications  Blue Lumen Status Flushed;Antimicrobial dead end cap;Heparin  locked  Red Lumen Status Flushed;Antimicrobial dead end cap;Heparin  locked  Purple Lumen Status N/A  Catheter fill solution Heparin  1000 units/ml  Catheter fill volume (Arterial) 2.1 cc  Catheter fill volume (Venous) 2.1  Dressing Type Transparent  Dressing Status Antimicrobial disc/dressing in place;Clean, Dry, Intact  Drainage Description None  Dressing Change Due 03/28/24  Post treatment catheter status Capped and Clamped    "

## 2024-03-24 NOTE — Progress Notes (Signed)
 Physical Therapy Session Note  Patient Details  Name: Tracy Baker MRN: [id number] Date of Birth: 07-28-1955  Today's Date: 03/24/2024 PT Individual Time: 0800-0858 PT Individual Time Calculation (min): 58 min   Short Term Goals: Week 2:  PT Short Term Goal 1 (Week 2): STG=LTG due to LOS  Skilled Therapeutic Interventions/Progress Updates:      Pt supine in bed upon arrival. Pt agreeable to therapy. Pt denies any pain.   Supine to sit with HOB elevated and supervision, verabl cues provided for sternal precautions however pt demonstrates improved adherence in comparison to last time PT saw this pt.   STS with rollator and CGA from elevated bed and BSC, min A progressing to CGA from [facility] and rollator,  verbal cues provided for UE positioning for adherence to sternal precautions.   Pt requesting to sit in WC at sink to wash face/brush teeth versus sitting on rollator. Pt brushed teeth, hair, washed face at sink with set up assist. Verbal cues provided for use of B LE versus UE for forward propulsion of WC for adherence to sternal precautions. After toileting pt stood at sink to wash hands with rollator and CGA.   Pt ambulated R side of bed to sink, sink<>BSC over toilet, 1x85, 1x50, 1X126 feet with rollator and CGA, verbal cues provided for pursed lip breathing. Pt required seated rest break on rollator x3 2/2 fatigue/SOB.   Sit to supine with supervision.   Pt supine in bed with all needs within reach and bed alarm on.      Therapy Documentation Precautions:  Precautions Precautions: Fall, Sternal Recall of Precautions/Restrictions: Impaired Precaution/Restrictions Comments: cortak Restrictions Weight Bearing Restrictions Per Provider Order: No Other Position/Activity Restrictions: Cardiac sternal precautions   Therapy/Group: Individual Therapy  [facility] Doreene Orris, [place], DPT  03/24/2024, 7:44 AM

## 2024-03-24 NOTE — Progress Notes (Signed)
 Occupational Therapy Session Note  Patient Details  Name: Tracy Baker MRN: [id number] Date of Birth: Mar 10, 1955  Today's Date: 03/24/2024 OT Individual Time: 1130-1200 OT Individual Time Calculation (min): 30 min    Short Term Goals: Week 2:  OT Short Term Goal 1 (Week 2): STG = LTG due to ELOS  Skilled Therapeutic Interventions/Progress Updates:    Skilled OT intervention with focus on functional amb with Rollator and BLE therex for general contitioning and to increase independence with BADLs. Amb with Rollator to ortho gym and return with supervision. NuStep 1x6 and 1x3 min level 3 with rest break-BLE only. Pt returned to room and transferred to bed. Sit>supine with supervision. Pt remained in bed with all needs within reach.   Therapy Documentation Precautions:  Precautions Precautions: Fall, Sternal Recall of Precautions/Restrictions: Impaired Precaution/Restrictions Comments: cortak Restrictions Weight Bearing Restrictions Per Provider Order: No Other Position/Activity Restrictions: Cardiac sternal precautions Pain: Pt denies pain this morning    Therapy/Group: Individual Therapy  Maritza Debby Mare 03/24/2024, 3:02 PM

## 2024-03-25 ENCOUNTER — Other Ambulatory Visit (HOSPITAL_COMMUNITY)

## 2024-03-25 ENCOUNTER — Ambulatory Visit: Payer: Self-pay

## 2024-03-25 DIAGNOSIS — R5381 Other malaise: Secondary | ICD-10-CM | POA: Diagnosis not present

## 2024-03-25 LAB — RENAL FUNCTION PANEL
Albumin: 4 g/dL (ref 3.5–5.0)
Anion gap: 14 (ref 5–15)
BUN: 27 mg/dL — ABNORMAL HIGH (ref 8–23)
CO2: 26 mmol/L (ref 22–32)
Calcium: 9.7 mg/dL (ref 8.9–10.3)
Chloride: 94 mmol/L — ABNORMAL LOW (ref 98–111)
Creatinine, Ser: 2.65 mg/dL — ABNORMAL HIGH (ref 0.44–1.00)
GFR, Estimated: 19 mL/min — ABNORMAL LOW
Glucose, Bld: 103 mg/dL — ABNORMAL HIGH (ref 70–99)
Phosphorus: 3.3 mg/dL (ref 2.5–4.6)
Potassium: 4.4 mmol/L (ref 3.5–5.1)
Sodium: 135 mmol/L (ref 135–145)

## 2024-03-25 LAB — GLUCOSE, CAPILLARY: Glucose-Capillary: 93 mg/dL (ref 70–99)

## 2024-03-25 MED ORDER — ONDANSETRON HCL 4 MG PO TABS
4.0000 mg | ORAL_TABLET | Freq: Once | ORAL | Status: AC
  Administered 2024-03-25: 4 mg via ORAL
  Filled 2024-03-25: qty 1

## 2024-03-25 MED ORDER — MIRTAZAPINE 15 MG PO TABS
7.5000 mg | ORAL_TABLET | Freq: Every day | ORAL | Status: DC
  Administered 2024-03-25: 7.5 mg via ORAL
  Filled 2024-03-25: qty 1

## 2024-03-25 NOTE — Progress Notes (Signed)
 Occupational Therapy Weekly Progress Note  Patient Details  Name: Tracy Baker MRN: [id number] Date of Birth: 04/10/1955  Beginning of progress report period: March 18, 2024 End of progress report period: March 25, 2024   Patient has met  7 of 8 long term goals. Pt is making adequate progress towards LTGs. She is able to bathe/dress with supervision, and toilet with min A. Intermittent cues needed for safety and problem solving. Pt does fluctuate with nausea, fatigue and dizziness with HD treatment, but overall has made improvements and can participate with encouragement. Do not anticipate that mod I goals are appropriate at current time due to mild cognitive deficits and fluctuating performance. In addition, pt continues to demonstrate dynamic standing, functional endurance deficits resulting in difficulty completing BADL tasks without increased physical assist. Pt will benefit from continued skilled OT services to focus on mentioned deficits. Family and pt in between SNF and pt DC home with fam ed planned to determine the need.   Patient continues to demonstrate the following deficits: muscle weakness, decreased cardiorespiratoy endurance, decreased coordination, decreased problem solving, and decreased standing balance and therefore will continue to benefit from skilled OT intervention to enhance overall performance with BADL.  Patient progressing toward long term goals..  Continue plan of care.  OT Short Term Goals Week 1:  OT Short Term Goal 1 (Week 1): Pt will complete sit > stand with CGA using LRAD OT Short Term Goal 1 - Progress (Week 1): Met OT Short Term Goal 2 (Week 1): Pt will complete toilet transfer with min A using LRAD OT Short Term Goal 2 - Progress (Week 1): Met OT Short Term Goal 3 (Week 1): Pt will complete 2/3 toileting steps with CGA for balance OT Short Term Goal 3 - Progress (Week 1): Met OT Short Term Goal 4 (Week 1): Pt will adhere to sternal precautions 100%  for 2 consecutive sessions with supervision OT Short Term Goal 4 - Progress (Week 1): Progressing toward goal Week 2:  OT Short Term Goal 1 (Week 2): STG = LTG due to ELOS Week 3:  OT Short Term Goal 1 (Week 3): STG = LTG (Continue progressing towards LTGs)    Yariana Hoaglund E Joselito Fieldhouse, MS, OTR/L  03/25/2024, 8:02 AM

## 2024-03-25 NOTE — Progress Notes (Signed)
 Reported by nightshift pt has been refusing boots, educated importance of wearing prevalon boots. Pt states she is willing to wear the Left boot but she will not wear both.   Agreeable to float heels.

## 2024-03-25 NOTE — Progress Notes (Signed)
 Patient ID: Tracy Baker, female   DOB: 05/02/1955, 69 y.o.   MRN: [id number] [facility] Progress Note   Assessment/ Plan:   1. Acute kidney Injury: Remains anuric and without any evidence of renal recovery.  Has been on dialysis since 02/16/2024 and process underway for outpatient dialysis unit placement with acute kidney injury ([facility]).  Status post left IJ TDC by IR on 12/22.   Will order for next hemodialysis treatment tomorrow.  Anticipating discharge to SNF. 2.  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  Currently on fluconazole  with lifelong therapeutic duration noted. 3. Hypotension: Blood pressures with decent control on midodrine  10 mg 3 times a day. 4.  Anemia: Will check hemoglobin/hematocrit with labs on hemodialysis today.  Subjective:   Reports difficulty sleeping last night and feeling a little fatigued this morning.   Objective:   BP 117/68 (BP Location: Left Arm)   Pulse (!) 59   Temp 98.2 F (36.8 C) (Oral)   Resp 18   Ht 5' 4 (1.626 m)   Wt 68.5 kg   SpO2 99%   BMI 25.92 kg/m   Intake/Output Summary (Last 24 hours) at 03/25/2024 1100 Last data filed at 03/25/2024 0805 Gross per 24 hour  Intake 598 ml  Output 2000 ml  Net -1402 ml   Weight change: 10.1 kg  Physical Exam: Gen: Comfortably resting in bed, friend at bedside CVS: Pulse regular rhythm, normal rate, S1 and S2 normal Resp: Clear to auscultation, no rales/rhonchi.  Right IJ TDC in place Abd: Soft, flat, nontender, bowel sounds normal Ext: No lower extremity edema.  Imaging: No results found.  Labs: BMET Recent Labs  Lab 03/19/24 0456 03/20/24 0519 03/21/24 0532 03/22/24 0916 03/23/24 1004 03/24/24 0539 03/25/24 0518  NA 138 136  136 136 135 136 138 135  K 4.4 4.4  4.2 4.5 4.6 4.6 4.4 4.4  CL 98 96*  97* 98 96* 96* 100 94*  CO2 25 27  28 24 26 24 22 26   GLUCOSE 94 84  86 90 107* 171* 105* 103*  BUN 46*  22  22 35* 28* 50* 63* 27*  CREATININE 4.35* 2.53*  2.55* 3.55* 2.85* 4.10* 4.55* 2.65*  CALCIUM  9.5 9.1  9.1 9.6 9.6 10.2 9.7 9.7  PHOS 4.7* 2.8 4.1 3.2 3.9 4.5 3.3   CBC Recent Labs  Lab 03/24/24 0539  WBC 5.8  HGB 9.7*  HCT 29.7*  MCV 99.7  PLT 148*    Medications:     amiodarone   200 mg Oral Daily   apixaban   5 mg Oral BID   ascorbic acid   500 mg Oral Q supper   cholecalciferol   1,000 Units Oral Daily   fluconazole   400 mg Oral Q supper   Followed by   NOREEN ON 05/05/2024] fluconazole   200 mg Oral Daily   Gerhardt's butt cream   Topical BID   hydrocerin   Topical BID   hydrocortisone  cream  1 Application Topical BID   megestrol   80 mg Oral BID   melatonin  5 mg Oral QHS   midodrine   10 mg Oral TID WC   mirtazapine   15 mg Oral QHS   mouth rinse  15 mL Mouth Rinse 4 times per day   pantoprazole   40 mg Oral Daily   sertraline   25 mg Oral QHS    Gordy Blanch, MD 03/25/2024, 11:00 AM

## 2024-03-25 NOTE — Progress Notes (Addendum)
 Patient ID: Tracy Baker, female   DOB: 08/18/1955, 69 y.o.   MRN: [id number]  Left a voicemail with Rob to provide an update. He was informed about the 5 acceptances that she had and they were not preference. Patient is making progress and home may be a better opportunity rather than SNF placement. SW left a callback number.   Will update the team and discuss in conference.   UPDATE:  [name] coming in for family education on Wednesday 01/20. Will follow-up tomorrow.   UPDATE:  [name], [name]   Also had a conversation with [name] about the 5 placement options and about making a decision for them to go with one of the 5 options or they have the option to go home. An email was sent with the options.   [name] is scheduled to participate in family education between 1-4.

## 2024-03-25 NOTE — Progress Notes (Signed)
 Physical Therapy Session Note  Patient Details  Name: Tracy Baker MRN: [id number] Date of Birth: [birth year]  Today's Date: 03/25/2024 PT Individual Time: 0815-0842 PT Individual Time Calculation (min): 27 min  Today's Date: 03/25/2024 PT Missed Time: 48 Minutes Missed Time Reason: Patient ill (Comment) (nausea)  Short Term Goals: Week 1:  PT Short Term Goal 1 (Week 1): pt will perform bed mobility with min A consistantly PT Short Term Goal 1 - Progress (Week 1): Met PT Short Term Goal 2 (Week 1): pt will perform bed<>chair transfers with LRAD and CGA PT Short Term Goal 2 - Progress (Week 1): Met PT Short Term Goal 3 (Week 1): pt will ambulate 63ft with LRAD and min A PT Short Term Goal 3 - Progress (Week 1): Met Week 2:  PT Short Term Goal 1 (Week 2): STG=LTG due to LOS  Skilled Therapeutic Interventions/Progress Updates:   Received pt semi-reclined in bed. Pt reported not feeling well c/o upset stomach, feeling nauseous, and face flushed - RN notified and present to adminster medication. Per chart review, pt had 2L removed during dialysis yesterday - discussed how pt has good days and bad days with dialysis. With encouragement, pt agreed to attempt OOB mobility to brush teeth. Pt transferred semi-reclined<>sitting R EOB with HOB elevated and supervision and ++ time. Upon sitting EOB, pt reported feeling worse and immediately returned to sidelying. Briefly discussed plan for family education tomorrow with daughter, [name], and concluded session with pt semi-reclined in bed with all needs within reach. 48 minutes missed of skilled physical therapy due to nausea.   Therapy Documentation Precautions:  Precautions Precautions: Fall, Sternal Recall of Precautions/Restrictions: Impaired Precaution/Restrictions Comments:  Restrictions Weight Bearing Restrictions Per Provider Order: No Other Position/Activity Restrictions: Cardiac sternal precautions  Therapy/Group: Individual  Therapy [name] [name] PT, DPT 03/25/2024, 6:49 AM

## 2024-03-25 NOTE — Progress Notes (Signed)
 Occupational Therapy Session Note  Patient Details  Name: Tracy Baker MRN: [id number] Date of Birth: 26-Sep-1955  Today's Date: 03/25/2024 OT Individual Time: 1102-1200 OT Individual Time Calculation (min): 58 min    Short Term Goals: Week 2:  OT Short Term Goal 1 (Week 2): STG = LTG due to ELOS  Skilled Therapeutic Interventions/Progress Updates:  Skilled OT intervention completed with focus on ADL retraining and DC planning. Pt received upright in bed, agreeable to session though still mildly nauseous. No pain reported- nurse administer meds during session.  Pt inquired OT opinion on what her DC plan should be (home vs SNF) as she feels she doesn't need as much help anymore. We discussed waiting for fam ed tomorrow to review her CLOF with family as well, but did discuss that on her good days she is functioning more independently than originally anticipated. Encouraged pt to do ADL session to self-observe her challenges if any and pt agreeable.  Of note- pt cleared to mobilize without sternal precautions by PA/cardiologist therefore did not hold pt to this but did encourage if she felt pain to revert back to original restrictions. Transitioned EOB with supervision using bed features. Completed all sit > stands and ambulatory transfers using rollator with supervision; intermittent cueing for safety to turn w/c facing rollator prior to standing and locking chair. Able to bathe/dress all parts with supervision at sit > stand level without LOB. Suggested pt use mesh undies with pad for happenstance of incontinence and to feel more to her baseline and pt appreciated this. Hygiene with set up A. Pt ambulated in hall about 100 ft for functional mobility endurance. At EOB, transitioned with mod I to upright in bed.   Concluded with discussing pt's abilities at supervision level and that between family, and landlord(?) pt should be able to manage on good days at home but having a back up is essential  for the bad days and she is unsure if this can be provided and pt doesn't want to be a burden. Will further discuss. Pt remained upright in bed, with bed alarm on/activated, and with all needs in reach at end of session.    Therapy Documentation Precautions:  Precautions Precautions: Fall, Sternal Recall of Precautions/Restrictions: Impaired Precaution/Restrictions Comments: cortak Restrictions Weight Bearing Restrictions Per Provider Order: No Other Position/Activity Restrictions: Cardiac sternal precautions    Therapy/Group: Individual Therapy  [name], MS, OTR/L  03/25/2024, 12:15 PM

## 2024-03-25 NOTE — Progress Notes (Addendum)
 "                                                        PROGRESS NOTE   Subjective/Complaints: Discussed with patient that ID note states it is recommended to continue fluconazole  indefinitely, discussed UA results from yesterday  ROS:  Pt denies SOB, abd pain, CP, N/V/C/D, and vision changes +fatigue   Objective:   No results found.  Recent Labs    03/24/24 0539  WBC 5.8  HGB 9.7*  HCT 29.7*  PLT 148*    Recent Labs    03/24/24 0539 03/25/24 0518  NA 138 135  K 4.4 4.4  CL 100 94*  CO2 22 26  GLUCOSE 105* 103*  BUN 63* 27*  CREATININE 4.55* 2.65*  CALCIUM  9.7 9.7    Intake/Output Summary (Last 24 hours) at 03/25/2024 0959 Last data filed at 03/25/2024 0805 Gross per 24 hour  Intake 598 ml  Output 2000 ml  Net -1402 ml       Wound 03/10/24 2051 Pressure Injury Sacrum Right Stage 2 -  Partial thickness loss of dermis presenting as a shallow open injury with a red, pink wound bed without slough. (Active)     Wound 03/15/24 1507 Pressure Injury Foot Left;Posterior Deep Tissue Pressure Injury - Purple or maroon localized area of discolored intact skin or blood-filled blister due to damage of underlying soft tissue from pressure and/or shear. (Active)    Physical Exam: Vital Signs Blood pressure 117/68, pulse (!) 59, temperature 98.2 F (36.8 C), temperature source Oral, resp. rate 18, height 5' 4 (1.626 m), weight 68.5 kg, SpO2 99%.  General: awake, alert, appropriate, NAD, laying in bed appears comfortable; vitals reviewed HENT: conjugate gaze; oropharynx dry CV: Bradycardia Pulmonary: CTA B/L; nonlabored GI: soft, NT, ND, (+)BS normoactive Extr: No LE edema. Psychiatric: appropriate, cooperative Neurological: Ox3  Skin: has chest wall incision, tunneled catheter on left chest wall, right buttock wound and bilateral heel wounds--> left heel now with quarter sized stage II--covered   Fine, erythematous, nonraised rash over neck, chest, back, and  proximal extensor surfaces of arms.  No pustules, no warmth.  Neuro: Alert,4-/5 strength throughout, good attention, sensation is intact, +bilateral hand tremors, stable 1/20    Assessment/Plan: 1. Functional deficits which require 3+ hours per day of interdisciplinary therapy in a comprehensive inpatient rehab setting. Physiatrist is providing close team supervision and 24 hour management of active medical problems listed below. Physiatrist and rehab team continue to assess barriers to discharge/monitor patient progress toward functional and medical goals  Care Tool:  Bathing    Body parts bathed by patient: Right arm, Left arm, Chest, Abdomen, Front perineal area, Left upper leg, Right upper leg, Face   Body parts bathed by helper: Buttocks, Right lower leg, Left lower leg     Bathing assist Assist Level: Moderate Assistance - Patient 50 - 74% (Min to ModA)     Upper Body Dressing/Undressing Upper body dressing   What is the patient wearing?: Pull over shirt    Upper body assist Assist Level: Supervision/Verbal cueing    Lower Body Dressing/Undressing Lower body dressing      What is the patient wearing?: Incontinence brief, Pants     Lower body assist Assist for lower body dressing: Minimal Assistance - Patient > 75%  Toileting Toileting    Toileting assist Assist for toileting: Set up assist     Transfers Chair/bed transfer  Transfers assist  Chair/bed transfer activity did not occur: Safety/medical concerns (pain, fatigue, dizziness)  Chair/bed transfer assist level: Supervision/Verbal cueing     Locomotion Ambulation   Ambulation assist   Ambulation activity did not occur: Safety/medical concerns (pain, fatigue, dizziness)  Assist level: Contact Guard/Touching assist Assistive device: Rollator Max distance: 126   Walk 10 feet activity   Assist  Walk 10 feet activity did not occur: Safety/medical concerns (pain, fatigue,  dizziness)  Assist level: Contact Guard/Touching assist Assistive device: Rollator   Walk 50 feet activity   Assist Walk 50 feet with 2 turns activity did not occur: Safety/medical concerns (pain, fatigue, dizziness)  Assist level: Contact Guard/Touching assist Assistive device: Rollator    Walk 150 feet activity   Assist Walk 150 feet activity did not occur: Safety/medical concerns (fatigue)         Walk 10 feet on uneven surface  activity   Assist Walk 10 feet on uneven surfaces activity did not occur: Safety/medical concerns (pain, fatigue, dizziness)   Assist level: Contact Guard/Touching assist Assistive device: Rollator   Wheelchair     Assist Is the patient using a wheelchair?: Yes Type of Wheelchair: Manual Wheelchair activity did not occur: Safety/medical concerns (pain, fatigue, dizziness)         Wheelchair 50 feet with 2 turns activity    Assist    Wheelchair 50 feet with 2 turns activity did not occur: Safety/medical concerns (pain, fatigue, dizziness)       Wheelchair 150 feet activity     Assist  Wheelchair 150 feet activity did not occur: Safety/medical concerns (pain, fatigue, dizziness)       Blood pressure 117/68, pulse (!) 59, temperature 98.2 F (36.8 C), temperature source Oral, resp. rate 18, height 5' 4 (1.626 m), weight 68.5 kg, SpO2 99%.  Medical Problem List and Plan: 1. Functional deficits secondary to debility after prolonged hospital stay related to mitral valve vegetations and MV repair and ultimate replacement.             -patient may  shower             -ELOS/Goals: 15 days, supervision goals with PT, OT, SLP  Con't CIR PT, OT and SLP Continue Eliquis  Full code but no chest compressions Will call son to update regarding her progress Discussed SNF placement  2. Hypotension: medications reviewed and is on amiodarone , d/c clonazepam , continue compression garments, continue midodrine  10mg  TID, continue  abdominal binder, discussed with patient and son that this is likely to be worst post-dialysis  3. Pain: A) Bilateral heel pain: d/ced Lyrica  because this pain improves B) Right ankle pain: ASO ordered, placed nursing for heat application in the morning for stiffness and ice in the afternoon to reduce inflammation. XR reviewed and is stable  4. Insomnia: continue Remeron , scheduled melatonin        - 1-17: Poor response to measures as above.  Increase melatonin to 5 mg, reviewed chart and confirmed patient with home medication clonazepam , will allow very low-dose 0.25 mg nightly as needed  1-18: Slept better with increased melatonin, did not use as needed clonazepam .  Continue regimen.  5. Neuropsych/cognition: This patient is capable of making decisions on her own behalf.  6. Skin/Wound Care: Air mattress overlay for pressure relief.             --continue Ensure and  prosource             --added Vitamin C  and Zinc to promote wound healing.   1/11 left heel appears to be evolution of what was present on admit   -continue above, prevalons   -discussed with pt importance of pressure relief, nutrition  1-18: Hydrocortisone  1% cream and one-time dose IV Benadryl  for rash; patient refuses Benadryl  due to not wanting IV be placed, unfortunately allergic to dye in of both capsule and liquid Benadryl .  No obvious recent medication changes or exposures.  Did tell her to switch from a paper to a cloth gown to reduce irritation.  7. Decreased appetite: resolved, d/c megace , weaning mirtazepine  8.Fungal endocarditis: continue oral fluconazole  indefinitely as per ID  9. 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   10. AKI with dialysis initiated 02/16/24: On HD MWF at the end of the day to help with tolerance of therapy             Cr reviewed and is improving             --CLIP on hold. Needs to be able to sit up in a chair for HD.    11. A fib: Monitor HR  TID--continue amiodarone  and eliquis .   12. Anemia of critical illness:Being monitored and stable.              --thrombocytopenia has resolved.   13. Urinary urgency:   -1/13 urine culture with Enterobacter cloaca sensitive to Bactrim , continue current regimen, completed 3 day course  Repeat UA ordered as per daughter's request and shows presence of fewer bacteria, discussed with patient  14. Daytime fatigue:  continue D3 daily Asked nephrology whether renal function panel can be discontinued so she does not need to be waken daily at 5am, daily weights also changed to 8am Discussed that this will likely continue to be worst following dialysis days B12 ordered (one time injection given red dye allergy), d/c seroquel , change lyrica  to HS, decreased therapy to 15/7, d/c clonazepam  at night, discussed that remeron  could contribute but will maintain for now given insomnia and to help stimulate appetite, melatonin scheduled at night to help with insomnia  15. Hyponatremia: Na level reviewed and this improved, continue to monitor Na levels with HD  16. Bilateral hand tremors: liver enzymes reviewed and are wnl, discussed that these seem to worsen when patient is anxious  17. Nausea: one time zofran  ordered    LOS: 15 days A FACE TO FACE EVALUATION WAS PERFORMED  Sven SQUIBB Tracy Baker 03/25/2024, 9:59 AM     "

## 2024-03-25 NOTE — Progress Notes (Signed)
 Contacted rehab CSW to request an update on pt's d/c plan and possible d/c date so update can be provided to pt's HD clinic. Will assist as needed.   Randine Mungo Dialysis Navigator [phone]  (Coverage)

## 2024-03-26 ENCOUNTER — Ambulatory Visit: Attending: Physician Assistant

## 2024-03-26 DIAGNOSIS — R5381 Other malaise: Secondary | ICD-10-CM | POA: Diagnosis not present

## 2024-03-26 LAB — RENAL FUNCTION PANEL
Albumin: 3.8 g/dL (ref 3.5–5.0)
Anion gap: 14 (ref 5–15)
BUN: 56 mg/dL — ABNORMAL HIGH (ref 8–23)
CO2: 27 mmol/L (ref 22–32)
Calcium: 9.6 mg/dL (ref 8.9–10.3)
Chloride: 95 mmol/L — ABNORMAL LOW (ref 98–111)
Creatinine, Ser: 4.82 mg/dL — ABNORMAL HIGH (ref 0.44–1.00)
GFR, Estimated: 9 mL/min — ABNORMAL LOW
Glucose, Bld: 116 mg/dL — ABNORMAL HIGH (ref 70–99)
Phosphorus: 4.8 mg/dL — ABNORMAL HIGH (ref 2.5–4.6)
Potassium: 4.8 mmol/L (ref 3.5–5.1)
Sodium: 136 mmol/L (ref 135–145)

## 2024-03-26 MED ORDER — HEPARIN SODIUM (PORCINE) 1000 UNIT/ML DIALYSIS
40.0000 [IU]/kg | INTRAMUSCULAR | Status: DC | PRN
  Administered 2024-03-26: 2700 [IU] via INTRAVENOUS_CENTRAL

## 2024-03-26 MED ORDER — HEPARIN SODIUM (PORCINE) 1000 UNIT/ML IJ SOLN
INTRAMUSCULAR | Status: AC
  Filled 2024-03-26: qty 3

## 2024-03-26 MED ORDER — MIDODRINE HCL 5 MG PO TABS
ORAL_TABLET | ORAL | Status: AC
  Filled 2024-03-26: qty 2

## 2024-03-26 MED ORDER — HEPARIN SODIUM (PORCINE) 1000 UNIT/ML IJ SOLN
INTRAMUSCULAR | Status: AC
  Filled 2024-03-26: qty 5

## 2024-03-26 NOTE — Progress Notes (Signed)
 Physical Therapy Session Note  Patient Details  Name: Tracy Baker MRN: [id number] Date of Birth: 1955-05-09  Today's Date: 03/26/2024 PT Individual Time: [phone] and [phone] PT Individual Time Calculation (min): 71 min and 30 min PT Missed Time: 15 minutes PT Missed Time Reason: Fatigue  Short Term Goals: Week 1:  PT Short Term Goal 1 (Week 1): pt will perform bed mobility with min A consistantly PT Short Term Goal 1 - Progress (Week 1): Met PT Short Term Goal 2 (Week 1): pt will perform bed<>chair transfers with LRAD and CGA PT Short Term Goal 2 - Progress (Week 1): Met PT Short Term Goal 3 (Week 1): pt will ambulate 56ft with LRAD and min A PT Short Term Goal 3 - Progress (Week 1): Met Week 2:  PT Short Term Goal 1 (Week 2): STG=LTG due to LOS  Skilled Therapeutic Interventions/Progress Updates:   Treatment Session 1 Received pt semi-reclined in bed, pt agreeable to PT treatment, and denied any pain during session. Pt originally had family ed scheduled with daughter this afternoon 1-4 but will not work due to HD schedule. Discussed having daughter come in tomorrow instead and treatment team made aware. Session with emphasis on functional mobility/transfers, generalized strengthening and endurance, dynamic standing balance/coordination, stair navigation, and ambulation. Pt required ++ time to get her bearings before getting up - pt transferred semi-reclined<>sitting R EOB with HOB elevated and supervision. Pt performed all transfers with rollator and close supervision throughout session. Pt ambulated to sink and sat in WC at sink and brushed teeth and washed face/body with setup assist.   Pt then ambulated 139ft x 2 trials with rollator and close supervision to/from main therapy gym. Took extended seated rest break then ambulated to/from staircase with rollator and supervision and navigated 4 6in steps with L handrail and CGA using a lateral stepping technique. Discussed pt's  progress and not requiring SNF placement but recommendation for 24/7 supervision at home initially. Pt reports her sister can come from [place]  and stay with her initially. Demonstrated to pt technique for moving rollator up/down stairs but reinforced that she will need her sister to help with this.  Pt then performed the following exercises with emphasis on LE strength/ROM: -standing heel raises 2x10 -standing hip abduction 2x10 bilaterally -standing alternating marches 2x10 bilaterally -standing mini squats 2x10 -seated LAQ with 1.5lb ankle weights 2x12 bilaterally -seated hip flexion with 1.5lb ankle weights 2x12 bilaterally   Discussed recommended DME for discharge and placed order for rollator in case pt does go home. Also discussed getting HHPT. Returned to room and ambulated in/out of bathroom with rollator and supervision. Pt able to manage clothing, void, and perform hygiene management without assist. Stood at sink and performed hand hygiene with supervision, then transferred into supine with supervision. Concluded session with pt semi-reclined in bed, needs within reach, and bed alarm on.    Treatment Session 2 Received pt sitting in [facility] reporting urge to toilet. Pt performed all transfers with rollator and close supervision throughout session. Pt ambulated in/out of bathroom with rollator and supervision. Pt able to manage clothing, void, and perform hygiene management without assist. Pt ambulated to sink and performed hand hygiene with supervision. Pt politely declined any further activity and requested to return to bed. Transferred into supine with supervision, then began talking to therapist about music concert in dayroom during last OT session and how the performer reminded her of her son. Pt showed therapist tribute video her son made to his father  and to her - pt smiling, talkative, and very clearly proud of her son. Concluded session with pt semi-reclined in bed with all needs  within reach. 15 minutes missed of skilled physical therapy due to fatigue.    Therapy Documentation Precautions:  Precautions Precautions: Fall, Sternal Recall of Precautions/Restrictions: Impaired Precaution/Restrictions Comments: cortak Restrictions Weight Bearing Restrictions Per Provider Order: No Other Position/Activity Restrictions: Cardiac sternal precautions  Therapy/Group: Individual Therapy [name] [name] PT, DPT 03/26/2024, 6:51 AM

## 2024-03-26 NOTE — Progress Notes (Signed)
 Physical Therapy Weekly Progress Note  Patient Details  Name: Tracy Baker MRN: [id number] Date of Birth: Sep 05, 1955  Beginning of progress report period: March 11, 2024 End of progress report period: March 26, 2024  Patient has met 4 of 8 long term goals. Pt demonstrates slow progress towards long term goals and fluctuates with mobility depending on fatigue from HD. Pt is currently able to perform bed mobility with supervision using hospital bed features, transfers with rollator and as little as supervision (but can require up to min A when fatigued), ambulate up to 121ft with rollator and CGA, and navigate 4 6in steps with 1 handrail and min A. Pt remains limited by global weakness/deconditioning, fatigue, and fluctuating motivation. Family/pt deciding between SNF and DC home with fam ed planned to determine need.   Patient continues to demonstrate the following deficits muscle weakness, decreased cardiorespiratoy endurance, and decreased standing balance, decreased postural control, decreased balance strategies, and difficulty maintaining precautions and therefore will continue to benefit from skilled PT intervention to increase functional independence with mobility.  Patient progressing toward long term goals..  Continue plan of care.  PT Short Term Goals Week 2:  PT Short Term Goal 1 (Week 2): STG=LTG due to LOS Week 3:  PT Short Term Goal 1 (Week 3): STG=LTG due to entended LOS  Skilled Therapeutic Interventions/Progress Updates:  Ambulation/gait training;Discharge planning;Functional mobility training;Therapeutic Activities;Psychosocial support;Visual/perceptual remediation/compensation;Balance/vestibular training;Disease management/prevention;Neuromuscular re-education;Skin care/wound management;Therapeutic Exercise;Wheelchair propulsion/positioning;Cognitive remediation/compensation;DME/adaptive equipment instruction;Pain management;Splinting/orthotics;UE/LE Strength  taining/ROM;Community reintegration;Patient/family education;Stair training;UE/LE Coordination activities   Therapy Documentation Precautions:  Precautions Precautions: Fall, Sternal Recall of Precautions/Restrictions: Impaired Precaution/Restrictions Comments: cortak Restrictions Weight Bearing Restrictions Per Provider Order: No Other Position/Activity Restrictions: Cardiac sternal precautions  Therapy/Group: Individual Therapy [name] [name] PT, DPT 03/26/2024, 7:22 AM

## 2024-03-26 NOTE — Progress Notes (Signed)
 "                                                        PROGRESS NOTE   Subjective/Complaints: No new complaints this morning Discussed that urine culture was ordered as per her daughter's request, discussed that it is not medically indicated since she has no current urine symptoms  ROS:  Pt denies SOB, abd pain, CP, N/V/C/D, and vision changes +fatigue, denies dysuria   Objective:   No results found.  Recent Labs    03/24/24 0539  WBC 5.8  HGB 9.7*  HCT 29.7*  PLT 148*    Recent Labs    03/24/24 0539 03/25/24 0518  NA 138 135  K 4.4 4.4  CL 100 94*  CO2 22 26  GLUCOSE 105* 103*  BUN 63* 27*  CREATININE 4.55* 2.65*  CALCIUM  9.7 9.7    Intake/Output Summary (Last 24 hours) at 03/26/2024 1043 Last data filed at 03/26/2024 9271 Gross per 24 hour  Intake 580 ml  Output --  Net 580 ml       Wound 03/10/24 2051 Pressure Injury Sacrum Right Stage 2 -  Partial thickness loss of dermis presenting as a shallow open injury with a red, pink wound bed without slough. (Active)     Wound 03/15/24 1507 Pressure Injury Foot Left;Posterior Deep Tissue Pressure Injury - Purple or maroon localized area of discolored intact skin or blood-filled blister due to damage of underlying soft tissue from pressure and/or shear. (Active)    Physical Exam: Vital Signs Blood pressure 111/63, pulse 62, temperature 98.2 F (36.8 C), temperature source Oral, resp. rate 17, height 5' 4 (1.626 m), weight 66.3 kg, SpO2 97%.  General: awake, alert, appropriate, NAD, laying in bed appears comfortable; vitals reviewed HENT: conjugate gaze; oropharynx dry CV: Bradycardia Pulmonary: CTA B/L; nonlabored GI: soft, NT, ND, (+)BS normoactive Extr: No LE edema. Psychiatric: appropriate, cooperative Neurological: Ox3  Skin: has chest wall incision, tunneled catheter on left chest wall, right buttock wound and bilateral heel wounds--> left heel now with quarter sized stage II--covered   Fine,  erythematous, nonraised rash over neck, chest, back, and proximal extensor surfaces of arms.  No pustules, no warmth.  Neuro: Alert,4-/5 strength throughout, good attention, sensation is intact, +bilateral hand tremors, stable 1/21    Assessment/Plan: 1. Functional deficits which require 3+ hours per day of interdisciplinary therapy in a comprehensive inpatient rehab setting. Physiatrist is providing close team supervision and 24 hour management of active medical problems listed below. Physiatrist and rehab team continue to assess barriers to discharge/monitor patient progress toward functional and medical goals  Care Tool:  Bathing    Body parts bathed by patient: Right arm, Left arm, Chest, Abdomen, Front perineal area, Buttocks, Right upper leg, Left upper leg, Right lower leg, Left lower leg, Face   Body parts bathed by helper: Buttocks, Right lower leg, Left lower leg     Bathing assist Assist Level: Supervision/Verbal cueing     Upper Body Dressing/Undressing Upper body dressing   What is the patient wearing?: Pull over shirt    Upper body assist Assist Level: Supervision/Verbal cueing    Lower Body Dressing/Undressing Lower body dressing      What is the patient wearing?: Underwear/pull up, Pants     Lower body assist Assist for  lower body dressing: Supervision/Verbal cueing     Toileting Toileting    Toileting assist Assist for toileting: Set up assist     Transfers Chair/bed transfer  Transfers assist  Chair/bed transfer activity did not occur: Safety/medical concerns (pain, fatigue, dizziness)  Chair/bed transfer assist level: Supervision/Verbal cueing     Locomotion Ambulation   Ambulation assist   Ambulation activity did not occur: Safety/medical concerns (pain, fatigue, dizziness)  Assist level: Supervision/Verbal cueing Assistive device: Rollator Max distance: 162ft   Walk 10 feet activity   Assist  Walk 10 feet activity did not  occur: Safety/medical concerns (pain, fatigue, dizziness)  Assist level: Supervision/Verbal cueing Assistive device: Rollator   Walk 50 feet activity   Assist Walk 50 feet with 2 turns activity did not occur: Safety/medical concerns (pain, fatigue, dizziness)  Assist level: Supervision/Verbal cueing Assistive device: Rollator    Walk 150 feet activity   Assist Walk 150 feet activity did not occur: Safety/medical concerns (fatigue)  Assist level: Supervision/Verbal cueing Assistive device: Rollator    Walk 10 feet on uneven surface  activity   Assist Walk 10 feet on uneven surfaces activity did not occur: Safety/medical concerns (pain, fatigue, dizziness)   Assist level: Contact Guard/Touching assist Assistive device: Rollator   Wheelchair     Assist Is the patient using a wheelchair?: Yes Type of Wheelchair: Manual Wheelchair activity did not occur: Safety/medical concerns (pain, fatigue, dizziness)         Wheelchair 50 feet with 2 turns activity    Assist    Wheelchair 50 feet with 2 turns activity did not occur: Safety/medical concerns (pain, fatigue, dizziness)       Wheelchair 150 feet activity     Assist  Wheelchair 150 feet activity did not occur: Safety/medical concerns (pain, fatigue, dizziness)       Blood pressure 111/63, pulse 62, temperature 98.2 F (36.8 C), temperature source Oral, resp. rate 17, height 5' 4 (1.626 m), weight 66.3 kg, SpO2 97%.  Medical Problem List and Plan: 1. Functional deficits secondary to debility after prolonged hospital stay related to mitral valve vegetations and MV repair and ultimate replacement.             -patient may  shower             -ELOS/Goals: 15 days, supervision goals with PT, OT, SLP  Continue CIR PT, OT and SLP Continue Eliquis  Full code but no chest compressions Will call son to update regarding her progress Discussed SNF placement  2. Hypotension: medications reviewed and is on  amiodarone , d/c clonazepam , continue compression garments, continue midodrine  10mg  TID, continue abdominal binder, discussed with patient and son that this is likely to be worst post-dialysis  3. Pain: A) Bilateral heel pain: d/ced Lyrica  because this pain improves B) Right ankle pain: ASO ordered, placed nursing for heat application in the morning for stiffness and ice in the afternoon to reduce inflammation. XR reviewed and is stable  4. Insomnia: d/c remeron , scheduled melatonin  5. Neuropsych/cognition: This patient is capable of making decisions on her own behalf.  6. Skin/Wound Care: Air mattress overlay for pressure relief.             --continue Ensure and prosource             --added Vitamin C  and Zinc to promote wound healing.   1/11 left heel appears to be evolution of what was present on admit   -continue above, prevalons   -discussed with pt  importance of pressure relief, nutrition  1-18: Hydrocortisone  1% cream and one-time dose IV Benadryl  for rash; patient refuses Benadryl  due to not wanting IV be placed, unfortunately allergic to dye in of both capsule and liquid Benadryl .  No obvious recent medication changes or exposures.  Did tell her to switch from a paper to a cloth gown to reduce irritation.  7. Decreased appetite: resolved, d/c megace , weaning mirtazepine  8.Fungal endocarditis: continue oral fluconazole  indefinitely as per ID  9. 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   10. AKI with dialysis initiated 02/16/24: On HD MWF at the end of the day to help with tolerance of therapy             Cr reviewed and is improving             --CLIP on hold. Needs to be able to sit up in a chair for HD.    11. A fib: Monitor HR TID--continue amiodarone  and eliquis .   12. Anemia of critical illness:Being monitored and stable.              --thrombocytopenia has resolved.   13. Urinary urgency:   -1/13 urine culture with Enterobacter cloaca  sensitive to Bactrim , continue current regimen, completed 3 day course  Repeat UA ordered as per daughter's request and shows presence of fewer bacteria, discussed with patient  14. Daytime fatigue:  continue D3 daily Asked nephrology whether renal function panel can be discontinued so she does not need to be waken daily at 5am, daily weights also changed to 8am Discussed that this will likely continue to be worst following dialysis days B12 ordered (one time injection given red dye allergy), d/c seroquel , change lyrica  to HS, decreased therapy to 15/7, d/c clonazepam  at night, discussed that remeron  could contribute but will maintain for now given insomnia and to help stimulate appetite, melatonin scheduled at night to help with insomnia  15. Hyponatremia: Na level reviewed and this improved, continue to monitor Na levels with HD  16. Bilateral hand tremors: liver enzymes reviewed and are wnl, discussed that these seem to worsen when patient is anxious  17. Nausea: improved    LOS: 16 days A FACE TO FACE EVALUATION WAS PERFORMED  Sven SQUIBB Foster Sonnier 03/26/2024, 10:43 AM     "

## 2024-03-26 NOTE — Progress Notes (Addendum)
 Noted that pt family is decided on SNF likely by this Friday, d/c date may be pushed out to early next week, possibly starting at clinic next thursday. Contacted out-pt HD clinic to inform of this. Will continue to assist as this time.   Lavanda Xyon Lukasik Dialysis Navigator [phone]

## 2024-03-26 NOTE — Progress Notes (Signed)
 Spoke with Tracy Baker who reports that she will come to family education tomorrow due to her work schedule; Tracy Baker scheduled her between 1pm-3pm.   UPDATE 3:18PM   Spoke with Tracy Baker again. She was concerned about the star ratings and abuse reports and states that she is unsure if she would send her mother there. Tracy Baker asked how HH would owrk - SW explained that once an agency is established, they will complete an assessment and continue to communicate with them from there. Tracy Baker is stuck between choosing between the facilities and home with [facility], and the patient's sister coming down to assist.   SW informed her that a discussion can be had tomorrow after the patients therapy sessions.

## 2024-03-26 NOTE — Progress Notes (Signed)
" °   03/26/24 1820  Vitals  Temp 98.6 F (37 C)  Pulse Rate 85  Resp 19  BP 113/72  SpO2 99 %  O2 Device Room Air  Weight 67.1 kg  Type of Weight Post-Dialysis  Oxygen Therapy  Patient Activity (if Appropriate) In bed  Pulse Oximetry Type Continuous  Oximetry Probe Site Changed No  Post Treatment  Dialyzer Clearance Lightly streaked  Liters Processed 84  Fluid Removed (mL) 1200 mL  Tolerated HD Treatment Yes   Received patient in bed to unit.  Alert and oriented.  Informed consent signed and in chart.   TX duration:3.5  Patient tolerated well.  Transported back to the room  Alert, without acute distress.  Hand-off given to patient's nurse.   Access used: Yes Access issues: No  Total UF removed: 1200 Medication(s) given: See MAR Post HD VS: See Above Grid Post HD weight: 67.1 kg   [facility] "

## 2024-03-26 NOTE — Progress Notes (Signed)
" ° ° ° ° ° ° °  Date: 03/26/2024  Patient name: Tracy Baker  Medical record number: [id number]  Date of birth: 1955/06/02   This note is to put my full support for my partner's recommendation that there is a high risk of recurrent fungal endocarditis EVEN with valve replacement.  Further conversations could be made with Dr Overton or others in our group as an outpatient.   Otherwise patient can go to another ID group ([facility], [facility], [facility], [place])    Jomarie Fleeta Rothman 03/26/2024, 3:18 PM  "

## 2024-03-26 NOTE — Progress Notes (Signed)
 Patient ID: Tracy Baker, female   DOB: 08/17/1955, 69 y.o.   MRN: [id number]  Due to patient dialysis, daughter unable to participate in family education.   Reached out to reschedule for another time. No answer, voicemail left with [name] with updated information and callback number.

## 2024-03-26 NOTE — Progress Notes (Signed)
 Patient ID: Tracy Baker, female   DOB: 02-10-1956, 69 y.o.   MRN: [id number] [facility] Progress Note   Assessment/ Plan:   1. Acute kidney Injury: She continues to anuric and without any evidence of functional renal recovery.  Has been on dialysis since 02/16/2024 and process underway for outpatient dialysis unit placement with acute kidney injury ([facility]) along with transient placement to SNF.  Status post left IJ TDC by IR on 12/22.  On schedule for hemodialysis today. 2.  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  Currently on fluconazole  with lifelong therapeutic duration noted. 3. Hypotension: Blood pressures with decent control on midodrine  10 mg 3 times a day. 4.  Anemia: Will check hemoglobin/hematocrit with labs on hemodialysis today.  Subjective:   Reports fatigue this morning after earlier Occupational Therapy session.   Objective:   BP 111/63 (BP Location: Left Arm)   Pulse 62   Temp 98.2 F (36.8 C) (Oral)   Resp 17   Ht 5' 4 (1.626 m)   Wt 66.3 kg   SpO2 97%   BMI 25.09 kg/m   Intake/Output Summary (Last 24 hours) at 03/26/2024 0915 Last data filed at 03/26/2024 9271 Gross per 24 hour  Intake 580 ml  Output --  Net 580 ml   Weight change: -4.189 kg  Physical Exam: Gen: Comfortably resting in bed, awake/alert CVS: Pulse regular rhythm, normal rate, S1 and S2 normal Resp: Clear to auscultation, no rales/rhonchi.  Right IJ TDC in place Abd: Soft, flat, nontender, bowel sounds normal Ext: No lower extremity edema.  Imaging: No results found.  Labs: BMET Recent Labs  Lab 03/20/24 0519 03/21/24 0532 03/22/24 0916 03/23/24 1004 03/24/24 0539 03/25/24 0518  NA 136  136 136 135 136 138 135  K 4.4  4.2 4.5 4.6 4.6 4.4 4.4  CL 96*  97* 98 96* 96* 100 94*  CO2 27  28 24 26 24 22 26   GLUCOSE 84  86 90 107* 171* 105* 103*  BUN 22  22 35* 28* 50* 63* 27*  CREATININE 2.53*   2.55* 3.55* 2.85* 4.10* 4.55* 2.65*  CALCIUM  9.1  9.1 9.6 9.6 10.2 9.7 9.7  PHOS 2.8 4.1 3.2 3.9 4.5 3.3   CBC Recent Labs  Lab 03/24/24 0539  WBC 5.8  HGB 9.7*  HCT 29.7*  MCV 99.7  PLT 148*    Medications:     amiodarone   200 mg Oral Daily   apixaban   5 mg Oral BID   ascorbic acid   500 mg Oral Q supper   cholecalciferol   1,000 Units Oral Daily   fluconazole   400 mg Oral Q supper   Followed by   NOREEN ON 05/05/2024] fluconazole   200 mg Oral Daily   Gerhardt's butt cream   Topical BID   hydrocerin   Topical BID   hydrocortisone  cream  1 Application Topical BID   melatonin  5 mg Oral QHS   midodrine   10 mg Oral TID WC   mirtazapine   7.5 mg Oral QHS   mouth rinse  15 mL Mouth Rinse 4 times per day   pantoprazole   40 mg Oral Daily   sertraline   25 mg Oral QHS    Gordy Blanch, MD 03/26/2024, 9:15 AM

## 2024-03-26 NOTE — Progress Notes (Signed)
 Occupational Therapy Session Note  Patient Details  Name: Tracy Baker MRN: [id number] Date of Birth: 19-Feb-1956  Today's Date: 03/26/2024 OT Individual Time: 9049-8954 OT Individual Time Calculation (min): 55 min    Short Term Goals: Week 3:  OT Short Term Goal 1 (Week 3): STG = LTG (Continue progressing towards LTGs)  Skilled Therapeutic Interventions/Progress Updates:  Skilled OT intervention completed with focus on ADL retraining, functional mobility, BUE strengthening. Pt received upright in bed, agreeable to session. No pain reported.  Pt expressed fatigue, but encouraged to attend live music performance in gym. Pt self-conscious about her lack of teeth and being in public- offered pt a mask to increase her confidence and then pt agreeable. Able to transition EOB with mod I. Completed all sit > Stands and ambulatory transfers with supervision using rollator during session.   Ambulated > toilet. Continent of urinary void. Able to manage all steps with distant supervision. Ambulated > sink for hand hygiene. Transported dependently in w/c <> gym for time management.   Pt participated in socialization at w/c level- pt expressing she really enjoyed being at the event and glad she came. Seated in w/c, pt completed the following BUE exercises to promote BUE strength/endurance needed for independence with functional transfers and BADLs: (With 2 lb dumbbell/dowel) -5x15 chest press -5x15 bicep curls  Transported back to room. Ambulated about 100 ft in hallway without LOB for cardiovascular endurance. Pt remained seated in w/c, with chair alarm on/activated, and with all needs in reach at end of session.   Therapy Documentation Precautions:  Precautions Precautions: Fall Recall of Precautions/Restrictions: Impaired Precaution/Restrictions Comments: sternal precautions lifted 1/19 Restrictions Weight Bearing Restrictions Per Provider Order: No     Therapy/Group: Individual  Therapy  Tracy FORBES Fritter, MS, OTR/L  03/26/2024, 11:40 AM

## 2024-03-27 DIAGNOSIS — R5381 Other malaise: Secondary | ICD-10-CM | POA: Diagnosis not present

## 2024-03-27 MED ORDER — WITCH HAZEL-GLYCERIN EX PADS: 1.0000 | MEDICATED_PAD | CUTANEOUS | Status: DC | PRN

## 2024-03-27 NOTE — Progress Notes (Signed)
 Physical Therapy Session Note  Patient Details  Name: Tracy Baker MRN: [id number] Date of Birth: June 16, 1955  Today's Date: 03/27/2024 PT Individual Time: 0931-1010 and 8584-8471 PT Individual Time Calculation (min): 39 min and 43 min  Short Term Goals: Week 2:  PT Short Term Goal 1 (Week 2): STG=LTG due to LOS Week 3:  PT Short Term Goal 1 (Week 3): STG=LTG due to entended LOS  Skilled Therapeutic Interventions/Progress Updates:   Treatment Session 1 Received pt semi-reclined in bed, pt agreeable to PT treatment, and denied any pain during session. Session with emphasis on functional mobility/transfers, generalized strengthening and endurance, dynamic standing balance/coordination, toileting, and ambulation. Pt transferred semi-reclined<>sitting R EOB with HOB elevated and use of bedrails with supervision. Pt performed all transfers with rollator and supervision throughout session. Pt ambulated in/out of bathroom with rollator and supervision and able to manage clothing, void, have BM and perform hygiene management without assist - reported soreness along rectum and bleeding when wiping (RN/MD notified). Pt sat in WC at sink and brushed teeth/washed face with setup assist. Stood at sink with supervision and washed lower body without assist. Provided pt with pads, mesh underwear, and clean scrub pants. Donned pants with supervision and able to stand and pull pants over hips without assist. Pt requested to return to bed - transferred into supine with supervision and concluded session with pt semi-reclined in bed with all needs within reach.   Treatment Session 2 Received pt semi-reclined in bed with Tracy Baker present for family education training. Pt agreeable to PT treatment and denied any pain during session. Session with emphasis on discharge planning, functional mobility/transfers, generalized strengthening and endurance, ambulation, simulated car transfers, and stair navigation. Pt performed  bed mobility x 2 trials with supervision using bed features with caution of sternal precautions.   Pt performed all transfers with rollator and supervision throughout session. Pt ambulated 110ft with rollator and supervision to main therapy gym. Pt navigated 4 6in steps with L handrail and close supervision using a lateral stepping techique. Pt ambulated additional 129ft with rollator and supervision to ortho gym and performed simulated car transfer with rollator and supervision, then ambulated 193ft with rollator and supervision back to room. Returned to bed and concluded session with pt semi-reclined in bed with all needs within reach. CSW at bedside talking with Tracy Baker.   Discussed with pt/Tracy Baker on the following: -using rollator with mobility for energy conservation -fluctuations with fatigue due to HD -sternal precautions and limitations -recommendation to have pt's sister stay with pt initially to provide 24/7 supervision  Tracy Baker ultimately concerned that pt not mentally at baseline and wanting to continue pursuing SNF due to difficulty arranging 24/7 supervision at home.   Therapy Documentation Precautions:  Precautions Precautions: Fall Recall of Precautions/Restrictions: Impaired Precaution/Restrictions Comments: sternal precautions lifted 1/19 Restrictions Weight Bearing Restrictions Per Provider Order: No Other Position/Activity Restrictions: Cardiac sternal precautions   Therapy/Group: Individual Therapy [name] [name] PT, DPT 03/27/2024, 7:01 AM

## 2024-03-27 NOTE — Plan of Care (Signed)
" °  Problem: Consults Goal: RH GENERAL PATIENT EDUCATION Description: See Patient Education module for education specifics. 03/27/2024 0528 by Arno Pears, RN Outcome: Progressing 03/27/2024 0527 by Arno Pears, RN Outcome: Progressing Goal: Skin Care Protocol Initiated - if Braden Score 18 or less Description: If consults are not indicated, leave blank or document N/A 03/27/2024 0528 by Arno Pears, RN Outcome: Progressing 03/27/2024 0527 by Arno Pears, RN Outcome: Progressing Goal: Nutrition Consult-if indicated 03/27/2024 0528 by Arno Pears, RN Outcome: Progressing 03/27/2024 0527 by Arno Pears, RN Outcome: Progressing Goal: Diabetes Guidelines if Diabetic/Glucose > 140 Description: If diabetic or lab glucose is > 140 mg/dl - Initiate Diabetes/Hyperglycemia Guidelines & Document Interventions  03/27/2024 0528 by Arno Pears, RN Outcome: Progressing 03/27/2024 0527 by Arno Pears, RN Outcome: Progressing   Problem: RH BOWEL ELIMINATION Goal: RH STG MANAGE BOWEL WITH ASSISTANCE Description: STG Manage Bowel with mod I  Assistance. 03/27/2024 0528 by Arno Pears, RN Outcome: Progressing 03/27/2024 0527 by Arno Pears, RN Outcome: Progressing Goal: RH STG MANAGE BOWEL W/MEDICATION W/ASSISTANCE Description: STG Manage Bowel with Medication with mod I Assistance. 03/27/2024 0528 by Arno Pears, RN Outcome: Progressing 03/27/2024 0527 by Arno Pears, RN Outcome: Progressing   Problem: RH SKIN INTEGRITY Goal: RH STG SKIN FREE OF INFECTION/BREAKDOWN Description: Manage skin w min assist 03/27/2024 0528 by Arno Pears, RN Outcome: Progressing 03/27/2024 0527 by Arno Pears, RN Outcome: Progressing Goal: RH STG MAINTAIN SKIN INTEGRITY WITH ASSISTANCE Description: STG Maintain Skin Integrity With Assistance. 03/27/2024 0528 by Arno Pears, RN Outcome: Progressing 03/27/2024 0527 by Arno Pears, RN Outcome: Progressing Goal: RH STG ABLE TO PERFORM INCISION/WOUND CARE  W/ASSISTANCE Description: STG Able To Perform Incision/Wound Care With min Assistance. 03/27/2024 0528 by Arno Pears, RN Outcome: Progressing 03/27/2024 0527 by Arno Pears, RN Outcome: Progressing   Problem: RH SAFETY Goal: RH STG ADHERE TO SAFETY PRECAUTIONS W/ASSISTANCE/DEVICE Description: STG Adhere to Safety Precautions With cues Assistance/Device. 03/27/2024 0528 by Arno Pears, RN Outcome: Progressing 03/27/2024 0527 by Arno Pears, RN Outcome: Progressing   Problem: RH PAIN MANAGEMENT Goal: RH STG PAIN MANAGED AT OR BELOW PT'S PAIN GOAL Description: Pain < 4 with prns 03/27/2024 0528 by Arno Pears, RN Outcome: Progressing 03/27/2024 0527 by Arno Pears, RN Outcome: Progressing   Problem: RH KNOWLEDGE DEFICIT GENERAL Goal: RH STG INCREASE KNOWLEDGE OF SELF CARE AFTER HOSPITALIZATION Description: Patient and family will be able to manage care at discharge using educational resources for medication, skin care and dietary modification independently 03/27/2024 0528 by Arno Pears, RN Outcome: Progressing 03/27/2024 0527 by Arno Pears, RN Outcome: Progressing   "

## 2024-03-27 NOTE — Progress Notes (Signed)
 Occupational Therapy Session Note  Patient Details  Name: Tracy Baker MRN: [id number] Date of Birth: [birth year]  Today's Date: 03/27/2024 OT Individual Time: 1300-1343 OT Individual Time Calculation (min): 43 min    Short Term Goals: Week 3:  OT Short Term Goal 1 (Week 3): STG = LTG (Continue progressing towards LTGs)  Skilled Therapeutic Interventions/Progress Updates:   Patient received supine in bed.  Patient asking to go easy for this session so she has energy left for PT session when daughter present this afternoon.   Patient able to get to edge of bed (elevated head) and to standing without physical assistance.  Patient declined need to void, and walked to [employer] with rollator and supervision.   Worked on static to dynamic stand balance in gym.  Patient with one loss of balance backward - and used hand on rollator to recover without external assistance.  Patient asking to practice stairs again.  Patient went up/down four steps with one railing with CGA.   Patient took seated rest break after stairs, and then walked back to her room.  Patient returned to bed.   Session was scheduled as Family Education - however per patient, daughter is looking at [facility] for discharge.  Patient states multiple times this session that she prefers to go home.      Therapy Documentation Precautions:  Precautions Precautions: Fall Recall of Precautions/Restrictions: Impaired Precaution/Restrictions Comments: sternal precautions lifted 1/19 Restrictions Weight Bearing Restrictions Per Provider Order: No Other Position/Activity Restrictions: Cardiac sternal precautions  Pain:  Denies pain    Therapy/Group: Individual Therapy  Nery Frappier M 03/27/2024, 1:43 PM

## 2024-03-27 NOTE — Patient Care Conference (Signed)
 Inpatient RehabilitationTeam Conference and Plan of Care Update Date: 03/26/2024   Time: 11:46 AM    Patient Name: Tracy Baker      Medical Record Number: [id number]  Date of Birth: [birth year] Sex: Female         Room/Bed: 4M04C/4M04C-01 Payor Info: Payor: [employer] MEDICARE / Plan: [facility] MEDICARE / Product Type: *No Product type* /    Admit Date/Time:  03/10/2024  8:51 PM  Primary Diagnosis:  Debility  Hospital Problems: Principal Problem:   Debility    Expected Discharge Date: Expected Discharge Date: 03/29/24  Team Members Present: Physician leading conference: Dr. Sven Elks Social Worker Present: Waverly Gentry, LCSW-A Nurse Present: Barnie Ronde, RN PT Present: [name], PT OT Present: Lorrayne Fritter, OT     Current Status/Progress Goal Weekly Team Focus  Bowel/Bladder      Continent of bowel and bladder          Swallow/Nutrition/ Hydration               ADL's   Supervision UB, CGA LB and toileting   Supervision   functional endurance, dynamic standing balance, fam ed    Mobility   bed mobility supervision, transfers with rollator CGA/min A, gait 150ft with rollator CGA, 4 6in steps with 1 handrail min A   supervision  barriers: family education and determining if family can arrange 24/7 supervision, fatigue, weakness/deconditoning, adherance to sternal precautions    Communication                Safety/Cognition/ Behavioral Observations               Pain     Neuropathy of feet managed           Skin      Rash on chest Bilateral heel wounds Sacral wound   Wounds healing and no new skin integrity issues    Hydrocortisone  to rash Wound care/dressing to sacrum and heels    Discharge Planning:    Home vs SNF. Daughter, Tracy Baker to plan for family education per previous conversation. HD set up at [facility] on 03/27/24, for a TTS, 1230 pm chair time. Awaiting therapy follow-up recommendations.   Team  Discussion: Patient admitted with debility post MVR with endocarditis and AKI; now on HD M-W-F. Patient with poor appetite on TF via cortrak with appetite stimulant. Functional progress limited by foot pain, defuse strength issues, fatigue and decreased initiation and lack of motivation; presenting similar to FTT with anoxic brain injury. Progress limited by dizziness and fatigue with poor adherence to sternal precautions.   Patient on target to meet rehab goals: Currently at a supervision level overall  *See Care Plan and progress notes for long and short-term goals.   Revisions to Treatment Plan:  MD adjusted Flagyl dosage Remeron  weaning Megace  weaning   Teaching Needs: Safety, medications, skin care/wound care, dietary modifications for CKD/HD and transfers, toileting, etc.   Current Barriers to Discharge: Home enviroment access/layout, Lack of/limited family support, and Hemodialysis Has a hospital bed and chair lift at home but limited assistance  Possible Resolutions to Barriers: Family education 03/27/2024 [facility] follow up services     Medical Summary Current Status: bilateral heel wounds, depression, mitral valve replacement, hypotension, vitamin D  insufficieny  Barriers to Discharge: Medical stability  Barriers to Discharge Comments: bilateral heel wounds, depression, mitral valve replacement, hypotension, vitamin D  insufficiency Possible Resolutions to [employer]: continue to float heels, remeron  d/ced, continue Zoloft , continue fluconazole , consutled ID for  second opinion regarding duration, continue midodrine  TID, continue vitamin D  supplement   Continued Need for Acute Rehabilitation Level of Care: The patient requires daily medical management by a physician with specialized training in physical medicine and rehabilitation for the following reasons: Direction of a multidisciplinary physical rehabilitation program to maximize functional independence : Yes Medical  management of patient stability for increased activity during participation in an intensive rehabilitation regime.: Yes Analysis of laboratory values and/or radiology reports with any subsequent need for medication adjustment and/or medical intervention. : Yes   I attest that I was present, lead the team conference, and concur with the assessment and plan of the team.   Fredericka Sober B 03/27/2024, 8:32 AM

## 2024-03-27 NOTE — Progress Notes (Signed)
 Patient ID: Tracy Baker, female   DOB: [birth year], 69 y.o.   MRN: [id number] [facility] Progress Note   Assessment/ Plan:   1. Acute kidney Injury: She continues to anuric and without any evidence of functional renal recovery.  Has been on dialysis since 02/16/2024 and process underway for outpatient dialysis unit placement with acute kidney injury ([facility]) along with transient placement to SNF.  Status post left IJ TDC by IR on 12/22.  Continue hemodialysis on MWF schedule with potential plans for discharge from CIR next week.  Accepted to [facility] for outpatient dialysis where she will be monitored for renal recovery. 2.  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  Currently on fluconazole  with lifelong therapeutic duration noted. 3. Hypotension: Blood pressures with decent control on midodrine  10 mg 3 times a day. 4.  Anemia: Will check hemoglobin/hematocrit with labs on hemodialysis today.  Subjective:   She reports that she had a better night after dialysis yesterday with limited ultrafiltration.   Objective:   BP (!) 99/59 (BP Location: Left Arm)   Pulse 75   Temp 98.3 F (36.8 C) (Oral)   Resp 18   Ht 5' 4 (1.626 m)   Wt 67.3 kg   SpO2 100%   BMI 25.47 kg/m   Intake/Output Summary (Last 24 hours) at 03/27/2024 1003 Last data filed at 03/26/2024 1820 Gross per 24 hour  Intake 120 ml  Output 1200 ml  Net -1080 ml   Weight change: 1.74 kg  Physical Exam: Gen: Resting comfortably in bed, awakens to voice CVS: Pulse regular rhythm, normal rate, S1 and S2 normal Resp: Clear to auscultation, no rales/rhonchi.  Right IJ TDC in place Abd: Soft, flat, nontender, bowel sounds normal Ext: No lower extremity edema.  Imaging: No results found.  Labs: BMET Recent Labs  Lab 03/21/24 0532 03/22/24 0916 03/23/24 1004 03/24/24 0539 03/25/24 0518 03/26/24 1501  NA 136 135 136 138 135 136   K 4.5 4.6 4.6 4.4 4.4 4.8  CL 98 96* 96* 100 94* 95*  CO2 24 26 24 22 26 27   GLUCOSE 90 107* 171* 105* 103* 116*  BUN 35* 28* 50* 63* 27* 56*  CREATININE 3.55* 2.85* 4.10* 4.55* 2.65* 4.82*  CALCIUM  9.6 9.6 10.2 9.7 9.7 9.6  PHOS 4.1 3.2 3.9 4.5 3.3 4.8*   CBC Recent Labs  Lab 03/24/24 0539  WBC 5.8  HGB 9.7*  HCT 29.7*  MCV 99.7  PLT 148*    Medications:     amiodarone   200 mg Oral Daily   apixaban   5 mg Oral BID   ascorbic acid   500 mg Oral Q supper   cholecalciferol   1,000 Units Oral Daily   fluconazole   400 mg Oral Q supper   Followed by   NOREEN ON 05/05/2024] fluconazole   200 mg Oral Daily   Gerhardt's butt cream   Topical BID   hydrocerin   Topical BID   hydrocortisone  cream  1 Application Topical BID   melatonin  5 mg Oral QHS   midodrine   10 mg Oral TID WC   mouth rinse  15 mL Mouth Rinse 4 times per day   pantoprazole   40 mg Oral Daily   sertraline   25 mg Oral QHS    Gordy Blanch, MD 03/27/2024, 10:03 AM

## 2024-03-27 NOTE — Progress Notes (Signed)
 Nutrition Follow Up  DOCUMENTATION CODES:   Severe malnutrition in context of chronic illness  INTERVENTION:   Encouraged continued adequate PO intake Double protein portions to meal trays  Encouraged family to bring in outside food, smoothies, shakes to help pt's intake If family brings in strawberry syrup, pt can request vanilla Ensure shake from floor stock to drink but otherwise does not want them ordered as part of regimen  Continue Magic cup TID with meals, each supplement provides 290 kcal and 9 grams of protein  Pt refuses most ONS (RD team has previously tried Ensure, Glucerna, Mighty Shakes, [employer], Juven and [employer])  NUTRITION DIAGNOSIS:   Severe Malnutrition related to chronic illness as evidenced by moderate muscle depletion, severe muscle depletion, moderate fat depletion. Remains applicable  GOAL:   Patient will meet greater than or equal to 90% of their needs Progressing  MONITOR:   PO intake, Supplement acceptance, Labs  REASON FOR ASSESSMENT:   Consult Assessment of nutrition requirement/status, Enteral/tube feeding initiation and management, Poor PO  ASSESSMENT:   69 yo female admitted with hx of Calbicans candidemia, nephrolithiasis s/p ureteral stent and lithotripsy with recent admission for MV endocarditis requiring MVR, post-op course complicated by shock post-op, junctional bradycardia, afib with RVR, and pneumonia requiring ECMO. Hospital course complicated by poor PO intake requiring Cortrak placement, AKI requirining CRRT then transitioned to HD, and fatigue. Admitted to CIR to work on debility.  Pt continues to have good appetite on appetite stimulant. Pt eating on average 91% of meals. Pt having regular bowel movements with no reports of recent nausea. Pt recently started on vitamin D3 for daytime fatigue and nighttime medication and lab work condensed to allow pt more time for restful sleep.   Pt continues HD  treatments, last treatment yesterday 1/21 will -1200 ml. Labs prior to HD showed increase in BUN and Creatinine, labs this morning pending for post HD values. Pt has discharge chair for continued HD pending discharge date. Discharge has been delayed but hopeful to be end of this week or early next week.  Will continue to monitor intake as pt nears discharge. Pt has overall made improvements in participation in therapy.   Average Meal Completion: 1/6-1/8: 31% average intake x 4 recorded meals 1/10-1/13: 88% average intake x 8 recorded meals 1/19-1/21: 91% average intake x 7 recorded meals  Nutritionally Relevant Medications: Vitamin C  500 mg daily Vitamin D3 1000 units daily Midodrine  TID Protonix  Zoloft    Labs reviewed: BUN 56<--27<--63 Cr 4.82<--2.65<--4.55 Phos 4.8<--3.3   Admit weight: 62 kg Current weight: 67.3 kg  HD Plan:  MWF schedule: 3.5h, no heparin . TDC.  Last HD: 1/21 UF removed: no UF goal Next HD: 1/23 Has chair schedule outpatient TTS 12:30pm starting next week   Diet Order:   Diet Order             Diet regular Room service appropriate? Yes; Fluid consistency: Thin  Diet effective now                   EDUCATION NEEDS:   Education needs have been addressed  Skin:  Skin Integrity Issues:: Stage II Stage II: sacrum Incisions: chest  Last BM:  1/20  Height:   Ht Readings from Last 1 Encounters:  03/10/24 5' 4 (1.626 m)    Weight:   Wt Readings from Last 1 Encounters:  03/27/24 67.3 kg    Ideal Body Weight:  54.55 kg  BMI:  Body mass index is  25.47 kg/m.  Estimated Nutritional Needs:   Kcal:  1900-2100  Protein:  85-105g  Fluid:  2L    Josette Glance, MS, RDN, LDN Clinical Dietitian I Please reach out via secure chat

## 2024-03-27 NOTE — Plan of Care (Signed)
" °  Problem: Consults Goal: RH GENERAL PATIENT EDUCATION Description: See Patient Education module for education specifics. Outcome: Progressing Goal: Skin Care Protocol Initiated - if Braden Score 18 or less Description: If consults are not indicated, leave blank or document N/A Outcome: Progressing Goal: Nutrition Consult-if indicated Outcome: Progressing Goal: Diabetes Guidelines if Diabetic/Glucose > 140 Description: If diabetic or lab glucose is > 140 mg/dl - Initiate Diabetes/Hyperglycemia Guidelines & Document Interventions  Outcome: Progressing   Problem: RH BOWEL ELIMINATION Goal: RH STG MANAGE BOWEL WITH ASSISTANCE Description: STG Manage Bowel with mod I  Assistance. Outcome: Progressing Goal: RH STG MANAGE BOWEL W/MEDICATION W/ASSISTANCE Description: STG Manage Bowel with Medication with mod I Assistance. Outcome: Progressing   Problem: RH SKIN INTEGRITY Goal: RH STG SKIN FREE OF INFECTION/BREAKDOWN Description: Manage skin w min assist Outcome: Progressing Goal: RH STG MAINTAIN SKIN INTEGRITY WITH ASSISTANCE Description: STG Maintain Skin Integrity With Assistance. Outcome: Progressing Goal: RH STG ABLE TO PERFORM INCISION/WOUND CARE W/ASSISTANCE Description: STG Able To Perform Incision/Wound Care With min Assistance. Outcome: Progressing   Problem: RH SAFETY Goal: RH STG ADHERE TO SAFETY PRECAUTIONS W/ASSISTANCE/DEVICE Description: STG Adhere to Safety Precautions With cues Assistance/Device. Outcome: Progressing   Problem: RH PAIN MANAGEMENT Goal: RH STG PAIN MANAGED AT OR BELOW PT'S PAIN GOAL Description: Pain < 4 with prns Outcome: Progressing   Problem: RH KNOWLEDGE DEFICIT GENERAL Goal: RH STG INCREASE KNOWLEDGE OF SELF CARE AFTER HOSPITALIZATION Description: Patient and family will be able to manage care at discharge using educational resources for medication, skin care and dietary modification independently Outcome: Progressing   "

## 2024-03-27 NOTE — Progress Notes (Addendum)
 Patient ID: Tracy Baker, female   DOB: Jul 09, 1955, 69 y.o.   MRN: [id number]  Tracy Baker has narrowed down to 2 possibly 3 SNF's for placement options. Tracy Baker was also made aware. She is aware that a decision has to be made by tonight no later than tomorrow to begin the auth process.

## 2024-03-27 NOTE — Progress Notes (Signed)
 "                                                        PROGRESS NOTE   Subjective/Complaints: MEW1 for hypotension, but she feels much better after dialysis than she usually does Discussed that she slept well last night Discussed daughter's caregiver education today  ROS:  Pt denies SOB, abd pain, CP, N/V/C/D, and vision changes +fatigue, denies dysuria   Objective:   No results found.  No results for input(s): WBC, HGB, HCT, PLT in the last 72 hours.   Recent Labs    03/25/24 0518 03/26/24 1501  NA 135 136  K 4.4 4.8  CL 94* 95*  CO2 26 27  GLUCOSE 103* 116*  BUN 27* 56*  CREATININE 2.65* 4.82*  CALCIUM  9.7 9.6    Intake/Output Summary (Last 24 hours) at 03/27/2024 9078 Last data filed at 03/26/2024 1820 Gross per 24 hour  Intake 120 ml  Output 1200 ml  Net -1080 ml       Wound 03/10/24 2051 Pressure Injury Sacrum Right Stage 2 -  Partial thickness loss of dermis presenting as a shallow open injury with a red, pink wound bed without slough. (Active)     Wound 03/15/24 1507 Pressure Injury Foot Left;Posterior Deep Tissue Pressure Injury - Purple or maroon localized area of discolored intact skin or blood-filled blister due to damage of underlying soft tissue from pressure and/or shear. (Active)    Physical Exam: Vital Signs Blood pressure (!) 99/59, pulse 75, temperature 98.3 F (36.8 C), temperature source Oral, resp. rate 18, height 5' 4 (1.626 m), weight 67.3 kg, SpO2 100%.  General: awake, alert, appropriate, NAD, laying in bed appears comfortable; vitals reviewed HENT: conjugate gaze; oropharynx dry CV: Bradycardia Pulmonary: CTA B/L; nonlabored GI: soft, NT, ND, (+)BS normoactive Extr: No LE edema. Psychiatric: appropriate, cooperative Neurological: Ox3  Skin: has chest wall incision, tunneled catheter on left chest wall, right buttock wound and bilateral heel wounds--> left heel now with quarter sized stage II--covered    Neuro:  Alert,4-/5 strength throughout, good attention, sensation is intact, +bilateral hand tremors, stable 1/22    Assessment/Plan: 1. Functional deficits which require 3+ hours per day of interdisciplinary therapy in a comprehensive inpatient rehab setting. Physiatrist is providing close team supervision and 24 hour management of active medical problems listed below. Physiatrist and rehab team continue to assess barriers to discharge/monitor patient progress toward functional and medical goals  Care Tool:  Bathing    Body parts bathed by patient: Right arm, Left arm, Chest, Abdomen, Front perineal area, Buttocks, Right upper leg, Left upper leg, Right lower leg, Left lower leg, Face   Body parts bathed by helper: Buttocks, Right lower leg, Left lower leg     Bathing assist Assist Level: Supervision/Verbal cueing     Upper Body Dressing/Undressing Upper body dressing   What is the patient wearing?: Pull over shirt    Upper body assist Assist Level: Supervision/Verbal cueing    Lower Body Dressing/Undressing Lower body dressing      What is the patient wearing?: Underwear/pull up, Pants     Lower body assist Assist for lower body dressing: Supervision/Verbal cueing     Toileting Toileting    Toileting assist Assist for toileting: Set up assist     Transfers Chair/bed  transfer  Transfers assist  Chair/bed transfer activity did not occur: Safety/medical concerns (pain, fatigue, dizziness)  Chair/bed transfer assist level: Supervision/Verbal cueing     Locomotion Ambulation   Ambulation assist   Ambulation activity did not occur: Safety/medical concerns (pain, fatigue, dizziness)  Assist level: Supervision/Verbal cueing Assistive device: Rollator Max distance: 110ft   Walk 10 feet activity   Assist  Walk 10 feet activity did not occur: Safety/medical concerns (pain, fatigue, dizziness)  Assist level: Supervision/Verbal cueing Assistive device: Rollator    Walk 50 feet activity   Assist Walk 50 feet with 2 turns activity did not occur: Safety/medical concerns (pain, fatigue, dizziness)  Assist level: Supervision/Verbal cueing Assistive device: Rollator    Walk 150 feet activity   Assist Walk 150 feet activity did not occur: Safety/medical concerns (fatigue)  Assist level: Supervision/Verbal cueing Assistive device: Rollator    Walk 10 feet on uneven surface  activity   Assist Walk 10 feet on uneven surfaces activity did not occur: Safety/medical concerns (pain, fatigue, dizziness)   Assist level: Contact Guard/Touching assist Assistive device: Rollator   Wheelchair     Assist Is the patient using a wheelchair?: Yes Type of Wheelchair: Manual Wheelchair activity did not occur: Safety/medical concerns (pain, fatigue, dizziness)         Wheelchair 50 feet with 2 turns activity    Assist    Wheelchair 50 feet with 2 turns activity did not occur: Safety/medical concerns (pain, fatigue, dizziness)       Wheelchair 150 feet activity     Assist  Wheelchair 150 feet activity did not occur: Safety/medical concerns (pain, fatigue, dizziness)       Blood pressure (!) 99/59, pulse 75, temperature 98.3 F (36.8 C), temperature source Oral, resp. rate 18, height 5' 4 (1.626 m), weight 67.3 kg, SpO2 100%.  Medical Problem List and Plan: 1. Functional deficits secondary to debility after prolonged hospital stay related to mitral valve vegetations and MV repair and ultimate replacement.             -patient may  shower             -ELOS/Goals: 15 days, supervision goals with PT, OT, SLP  Continue CIR PT, OT and SLP Continue Eliquis  Full code but no chest compressions Will call son to update regarding her progress Discussed SNF placement  2. Hypotension: medications reviewed and is on amiodarone , d/c clonazepam , continue compression garments, continue midodrine  10mg  TID, continue abdominal binder, discussed  with patient and son that this is likely to be worst post-dialysis  3. Pain: A) Bilateral heel pain: d/ced Lyrica  because this pain improves B) Right ankle pain: ASO ordered, placed nursing for heat application in the morning for stiffness and ice in the afternoon to reduce inflammation. XR reviewed and is stable  4. Insomnia: d/c remeron , scheduled melatonin  5. Neuropsych/cognition: This patient is capable of making decisions on her own behalf.  6. Skin/Wound Care: Air mattress overlay for pressure relief.             --continue Ensure and prosource             --added Vitamin C  and Zinc to promote wound healing.   1/11 left heel appears to be evolution of what was present on admit   -continue above, prevalons   -discussed with pt importance of pressure relief, nutrition  1-18: Hydrocortisone  1% cream and one-time dose IV Benadryl  for rash; patient refuses Benadryl  due to not wanting IV be placed,  unfortunately allergic to dye in of both capsule and liquid Benadryl .  No obvious recent medication changes or exposures.  Did tell her to switch from a paper to a cloth gown to reduce irritation.  7. Decreased appetite: resolved, d/c megace , weaning mirtazepine  8.Fungal endocarditis: continue oral fluconazole  indefinitely as per ID  9. 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   10. AKI with dialysis initiated 02/16/24: On HD MWF at the end of the day to help with tolerance of therapy             Cr reviewed and is improving             --CLIP on hold. Needs to be able to sit up in a chair for HD.    11. A fib: Monitor HR TID--continue amiodarone  and eliquis .   12. Anemia of critical illness: Continue to monitor Hgb with HD labs   13.Enterobacter cloaca UTI: completed 3 day course of Bactrim   14. Daytime fatigue:  continue D3 daily Asked nephrology whether renal function panel can be discontinued so she does not need to be waken daily at 5am, daily  weights also changed to 8am Discussed that this will likely continue to be worst following dialysis days B12 ordered (one time injection given red dye allergy), d/c seroquel , change lyrica  to HS, decreased therapy to 15/7, d/c clonazepam  at night, discussed that remeron  could contribute but will maintain for now given insomnia and to help stimulate appetite, melatonin scheduled at night to help with insomnia  15. Hyponatremia: Na level reviewed and this improved, continue to monitor Na levels with HD  16. Bilateral hand tremors: liver enzymes reviewed and are wnl, discussed that these seem to worsen when patient is anxious  17. Nausea: improved    LOS: 17 days A FACE TO FACE EVALUATION WAS PERFORMED  Sven SQUIBB Lytle Mal[place] 03/27/2024, 9:21 AM     "

## 2024-03-28 DIAGNOSIS — R5381 Other malaise: Secondary | ICD-10-CM | POA: Diagnosis not present

## 2024-03-28 MED ORDER — MIDODRINE HCL 5 MG PO TABS
5.0000 mg | ORAL_TABLET | Freq: Three times a day (TID) | ORAL | Status: DC
  Administered 2024-03-28 – 2024-04-02 (×15): 5 mg via ORAL
  Filled 2024-03-28 (×15): qty 1

## 2024-03-28 MED ORDER — WITCH HAZEL-GLYCERIN EX PADS: 1.0000 | MEDICATED_PAD | CUTANEOUS | Status: DC | PRN

## 2024-03-28 NOTE — Progress Notes (Signed)
 Patient ID: Tracy Baker, female   DOB: 1955-09-27, 69 y.o.   MRN: [id number] [facility] Progress Note   Assessment/ Plan:   1. Acute kidney Injury: She continues to anuric and without any evidence of functional renal recovery.  Has been on dialysis since 02/16/2024 and is status post left IJ TDC by IR on 12/22.  Continue hemodialysis on MWF schedule with next hemodialysis tomorrow.  She will transiently need a local dialysis unit in [place] based on skilled nursing facility placement (previously accepted to [facility]). 2.  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  Currently on fluconazole  with lifelong therapeutic duration noted. 3. Hypotension: Blood pressures with decent control on midodrine  10 mg 3 times a day. 4.  Anemia: Will check hemoglobin/hematocrit with labs on hemodialysis today.  Subjective:   Without acute events overnight, plans noted for placement to a local SNF.   Objective:   BP 124/74 (BP Location: Left Arm)   Pulse 60   Temp 98.2 F (36.8 C) (Oral)   Resp 20   Ht 5' 4 (1.626 m)   Wt 67.3 kg   SpO2 100%   BMI 25.47 kg/m   Intake/Output Summary (Last 24 hours) at 03/28/2024 1014 Last data filed at 03/28/2024 0914 Gross per 24 hour  Intake 716 ml  Output --  Net 716 ml   Weight change: -0.726 kg  Physical Exam: Gen: Appears comfortable laying flat in bed, PT at bedside CVS: Pulse regular rhythm, normal rate, S1 and S2 normal Resp: Clear to auscultation, no rales/rhonchi.  Right IJ TDC in place Abd: Soft, flat, nontender, bowel sounds normal Ext: No lower extremity edema.  Imaging: No results found.  Labs: BMET Recent Labs  Lab 03/22/24 0916 03/23/24 1004 03/24/24 0539 03/25/24 0518 03/26/24 1501  NA 135 136 138 135 136  K 4.6 4.6 4.4 4.4 4.8  CL 96* 96* 100 94* 95*  CO2 26 24 22 26 27   GLUCOSE 107* 171* 105* 103* 116*  BUN 28* 50* 63* 27* 56*  CREATININE 2.85* 4.10*  4.55* 2.65* 4.82*  CALCIUM  9.6 10.2 9.7 9.7 9.6  PHOS 3.2 3.9 4.5 3.3 4.8*   CBC Recent Labs  Lab 03/24/24 0539  WBC 5.8  HGB 9.7*  HCT 29.7*  MCV 99.7  PLT 148*    Medications:     amiodarone   200 mg Oral Daily   apixaban   5 mg Oral BID   ascorbic acid   500 mg Oral Q supper   cholecalciferol   1,000 Units Oral Daily   fluconazole   400 mg Oral Q supper   Followed by   NOREEN ON 05/05/2024] fluconazole   200 mg Oral Daily   Gerhardt's butt cream   Topical BID   hydrocerin   Topical BID   hydrocortisone  cream  1 Application Topical BID   melatonin  5 mg Oral QHS   midodrine   10 mg Oral TID WC   mouth rinse  15 mL Mouth Rinse 4 times per day   pantoprazole   40 mg Oral Daily   sertraline   25 mg Oral QHS    Gordy Blanch, MD 03/28/2024, 10:14 AM

## 2024-03-28 NOTE — Progress Notes (Signed)
 Occupational Therapy Session Note  Patient Details  Name: Tracy Baker MRN: [id number] Date of Birth: 12/01/1955  Today's Date: 03/28/2024 OT Individual Time: 9194-9084 OT Individual Time Calculation (min): 70 min    Short Term Goals: Week 2:  OT Short Term Goal 1 (Week 2): STG = LTG due to ELOS  Skilled Therapeutic Interventions/Progress Updates:  Skilled OT intervention completed with focus on ADL retraining, functional endurance. Pt received upright in bed, agreeable to session. No pain reported but did request MD to assess rash on her arm (secure chat sent) and dry mouth in which water  was provided. Of note- pt found pill in bed- nurse notified and instructed pt not to take until nurse assessed.  Pt transitioned EOB mod I. Completed all sit > stands and ambulatory transfers using rollator with distant supervision during session at the household distance level.   Able to bathe/dress all parts with supervision. Pt endorsed liking the disposable underwear and pad method and we discussed ways to continue this at home, in addition to creating a routine like she has here at the SNF and at home to help with motivation, staying active and overall well being.   Ambulated > gym. Seated rest needed, then completed 2x15 BUE chest press with 2 lb dowel for BUE strengthening needed for functional mobility. Ambulated > room, transitioned mod I back to upright in bed. Pt remained upright in bed, with bed alarm on/activated, and with all needs in reach at end of session.   Therapy Documentation Precautions:  Precautions Precautions: Fall Recall of Precautions/Restrictions: Impaired Precaution/Restrictions Comments: sternal precautions lifted 1/19 Restrictions Weight Bearing Restrictions Per Provider Order: No Other Position/Activity Restrictions: Cardiac sternal precautions    Therapy/Group: Individual Therapy  [name], MS, OTR/L  03/28/2024, 9:42 AM

## 2024-03-28 NOTE — Progress Notes (Addendum)
 Patient ID: Tracy Baker, female   DOB: Jun 15, 1955, 69 y.o.   MRN: [id number]  Patient/Family decided to pursue [facility], [facility] and [facility].  [facility]: Left a voicemail with admissions. Awaiting response back  [facility]: Left a voicemail with admissions. Awaiting response back  [facility]: Spoke with Taya in Admissions, she reports that they may have a bed available as early as Monday but it is contingent on weather. She is aware that the family is providing personal transport. She will keep SW updated.   Patient will have to discharge on a non dialysis day which is a Tuesday or Thursday. Family was updated.

## 2024-03-28 NOTE — Progress Notes (Signed)
 Patient ID: Tracy Baker, female   DOB: April 05, 1955, 69 y.o.   MRN: [id number]  Spoke with Lavanda Fredrickson regarding dialysis transfer should patient get into [facility] for SNF - [facility] are full.

## 2024-03-28 NOTE — Progress Notes (Signed)
 Recreational Therapy Discharge Summary Patient Details  Name: Asami Lambright MRN: [id number] Date of Birth: Aug 05, 1955 Today's Date: 03/28/2024  Comments on progress toward goals: Pt has made great progress during LOS and is awaiting SNF placement within the next few days discharging at overall supervision level.  TR sessions focused on pt education including activity analysis/modifications, importance of all domains of wellness & their impact on each other an overall health and wellness, community reintegration, and coping strategies/stress management.  Pt participated in simulated community outing to the hospital gift shop at overall contact guard assist ambulatory level.    Reasons for discharge: discharge from hospital  Follow-up: SNF  Patient/family agrees with progress made and goals achieved: Yes  Richardine Peppers 03/28/2024, 12:15 PM

## 2024-03-28 NOTE — Progress Notes (Addendum)
 Contacted by social work regarding possible need for out-pt HD clinic transfer. Pt currently set up with [facility], however, Pt will likely go to [facility] upon d/c, and will need a transfer if so.  Contacted [facility] admissions at this time to give a heads up and check for anything opening in [place] at this time. Have requested this, will document when update from admissions provided.   Lavanda Maher Shon Dialysis Navigator [phone]  Addendum 1:41pm pt has been approved for [facility] [place] location, TTS, 1220. Can start Tuesday the  29th. need to arrive first day 1145am for paperwork. Social work notified, AVS updated.   Addendum 3:22pm Contacted by social work regarding updated plans. Pt will be likely now going to [facility] rehab next week and will need a spot at [facility], will begin referral, already called clinic and gave heads up.

## 2024-03-28 NOTE — Progress Notes (Addendum)
 "                                                        PROGRESS NOTE   Subjective/Complaints: No new complaints this morning Discussed caregiver training with her daughter yesterday and that her daughter prefers SNF placement  ROS:  Pt denies SOB, abd pain, CP, N/V/C/D, and vision changes +fatigue, denies dysuria   Objective:   No results found.  No results for input(s): WBC, HGB, HCT, PLT in the last 72 hours.   Recent Labs    03/26/24 1501  NA 136  K 4.8  CL 95*  CO2 27  GLUCOSE 116*  BUN 56*  CREATININE 4.82*  CALCIUM  9.6    Intake/Output Summary (Last 24 hours) at 03/28/2024 1024 Last data filed at 03/28/2024 9085 Gross per 24 hour  Intake 716 ml  Output --  Net 716 ml       Wound 03/10/24 2051 Pressure Injury Sacrum Right Stage 2 -  Partial thickness loss of dermis presenting as a shallow open injury with a red, pink wound bed without slough. (Active)     Wound 03/15/24 1507 Pressure Injury Foot Left;Posterior Deep Tissue Pressure Injury - Purple or maroon localized area of discolored intact skin or blood-filled blister due to damage of underlying soft tissue from pressure and/or shear. (Active)    Physical Exam: Vital Signs Blood pressure 124/74, pulse 60, temperature 98.2 F (36.8 C), temperature source Oral, resp. rate 20, height 5' 4 (1.626 m), weight 67.3 kg, SpO2 100%.  General: awake, alert, appropriate, NAD, laying in bed appears comfortable; vitals reviewed HENT: conjugate gaze; oropharynx dry CV: Bradycardia Pulmonary: CTA B/L; nonlabored GI: soft, NT, ND, (+)BS normoactive Extr: No LE edema. Psychiatric: appropriate, cooperative Neurological: Ox3  Skin: has chest wall incision, tunneled catheter on left chest wall, right buttock wound and bilateral heel wounds--> left heel now with quarter sized stage II--covered +rash on chest, erythematous    Neuro: Alert,4-/5 strength throughout, good attention, sensation is intact,  +bilateral hand tremors, stable 1/23    Assessment/Plan: 1. Functional deficits which require 3+ hours per day of interdisciplinary therapy in a comprehensive inpatient rehab setting. Physiatrist is providing close team supervision and 24 hour management of active medical problems listed below. Physiatrist and rehab team continue to assess barriers to discharge/monitor patient progress toward functional and medical goals  Care Tool:  Bathing    Body parts bathed by patient: Right arm, Left arm, Chest, Abdomen, Front perineal area, Buttocks, Right upper leg, Left upper leg, Right lower leg, Left lower leg, Face   Body parts bathed by helper: Buttocks, Right lower leg, Left lower leg     Bathing assist Assist Level: Supervision/Verbal cueing     Upper Body Dressing/Undressing Upper body dressing   What is the patient wearing?: Pull over shirt    Upper body assist Assist Level: Supervision/Verbal cueing    Lower Body Dressing/Undressing Lower body dressing      What is the patient wearing?: Underwear/pull up, Pants     Lower body assist Assist for lower body dressing: Supervision/Verbal cueing     Toileting Toileting    Toileting assist Assist for toileting: Set up assist     Transfers Chair/bed transfer  Transfers assist  Chair/bed transfer activity did not occur: Safety/medical concerns (pain, fatigue,  dizziness)  Chair/bed transfer assist level: Supervision/Verbal cueing     Locomotion Ambulation   Ambulation assist   Ambulation activity did not occur: Safety/medical concerns (pain, fatigue, dizziness)  Assist level: Supervision/Verbal cueing Assistive device: Rollator Max distance: 127ft   Walk 10 feet activity   Assist  Walk 10 feet activity did not occur: Safety/medical concerns (pain, fatigue, dizziness)  Assist level: Supervision/Verbal cueing Assistive device: Rollator   Walk 50 feet activity   Assist Walk 50 feet with 2 turns activity  did not occur: Safety/medical concerns (pain, fatigue, dizziness)  Assist level: Supervision/Verbal cueing Assistive device: Rollator    Walk 150 feet activity   Assist Walk 150 feet activity did not occur: Safety/medical concerns (fatigue)  Assist level: Supervision/Verbal cueing Assistive device: Rollator    Walk 10 feet on uneven surface  activity   Assist Walk 10 feet on uneven surfaces activity did not occur: Safety/medical concerns (pain, fatigue, dizziness)   Assist level: Contact Guard/Touching assist Assistive device: Rollator   Wheelchair     Assist Is the patient using a wheelchair?: Yes Type of Wheelchair: Manual Wheelchair activity did not occur: Safety/medical concerns (pain, fatigue, dizziness)         Wheelchair 50 feet with 2 turns activity    Assist    Wheelchair 50 feet with 2 turns activity did not occur: Safety/medical concerns (pain, fatigue, dizziness)       Wheelchair 150 feet activity     Assist  Wheelchair 150 feet activity did not occur: Safety/medical concerns (pain, fatigue, dizziness)       Blood pressure 124/74, pulse 60, temperature 98.2 F (36.8 C), temperature source Oral, resp. rate 20, height 5' 4 (1.626 m), weight 67.3 kg, SpO2 100%.  Medical Problem List and Plan: 1. Functional deficits secondary to debility after prolonged hospital stay related to mitral valve vegetations and MV repair and ultimate replacement.             -patient may  shower             -ELOS/Goals: 15 days, supervision goals with PT, OT, SLP  Continue CIR PT, OT and SLP Continue Eliquis  Full code but no chest compressions Will call son to update regarding her progress Discussed SNF placement  2. Hypotension: medications reviewed and is on amiodarone , d/c clonazepam , continue compression garments, midodrine  decreased to 5mg  since hypotension has improved and patient is complaining of dry mouth, continue abdominal binder, discussed with  patient and son that this is likely to be worst post-dialysis  3. Pain: A) Bilateral heel pain: d/ced Lyrica  because this pain improves B) Right ankle pain: ASO ordered, placed nursing for heat application in the morning for stiffness and ice in the afternoon to reduce inflammation. XR reviewed and is stable  4. Insomnia: d/c remeron , scheduled melatonin  5. Neuropsych/cognition: This patient is capable of making decisions on her own behalf.  6. Skin/Wound Care: Air mattress overlay for pressure relief.             --continue Ensure and prosource             --added Vitamin C  and Zinc to promote wound healing.   1/11 left heel appears to be evolution of what was present on admit   -continue above, prevalons   -discussed with pt importance of pressure relief, nutrition  Sarna and hydrocortisone  ordered for rash on chest  7. Decreased appetite: resolved, d/c megace , weaning mirtazepine  8.Fungal endocarditis: continue oral fluconazole  indefinitely as per  ID, discussed with patient and family  9. Mitral valve replacement with redo: has completed sternal precautions  10. AKI with dialysis initiated 02/16/24: On HD MWF at the end of the day to help with tolerance of therapy             Cr reviewed and is improving             --CLIP on hold. Needs to be able to sit up in a chair for HD.    11. A fib: Monitor HR TID--continue amiodarone  and eliquis .   12. Anemia of critical illness: Continue to monitor Hgb with HD labs   13.Enterobacter cloaca UTI: completed 3 day course of Bactrim   14. Daytime fatigue:  continue D3 daily Asked nephrology whether renal function panel can be discontinued so she does not need to be waken daily at 5am, daily weights also changed to 8am Discussed that this will likely continue to be worst following dialysis days B12 ordered (one time injection given red dye allergy), d/c seroquel , change lyrica  to HS, decreased therapy to 15/7, d/c clonazepam  at night,  discussed that remeron  could contribute but will maintain for now given insomnia and to help stimulate appetite, melatonin scheduled at night to help with insomnia  15. Hyponatremia: Na level reviewed and this improved, continue to monitor Na levels with HD  16. Bilateral hand tremors: liver enzymes reviewed and are wnl, discussed that these seem to worsen when patient is anxious  17. Nausea: improved    LOS: 18 days A FACE TO FACE EVALUATION WAS PERFORMED  Sven SQUIBB Mahati Vajda 03/28/2024, 10:24 AM     "

## 2024-03-28 NOTE — Progress Notes (Signed)
 Patient ID: Tracy Baker, female   DOB: May 01, 1955, 69 y.o.   MRN: [id number]  Sonja from [facility] informed SW that she would have a bed available on Monday if patient is still wanting to accept.   Called patient/family to provide an update, they wanted to try [facility] again as that is their first option. [name] reported that she would reach back out within the hour to follow up.  SW will inform [facility] that patient/family decided to go with another facility.   Damian [phone] - from [facility] contacted SW and extended a semi-bed offer for either Monday or Tuesday next weekend contingent on the weather.   Abigail working on establishing a seat at [facility] for.   SW started auth with [employer] reference # [id number]. Clinicals sent to [employer] [phone]

## 2024-03-28 NOTE — Discharge Summary (Incomplete)
 Physician Discharge Summary  Patient ID: Tracy Baker MRN: [id number] DOB/AGE: [birth year] 69 y.o.  Admit date: 03/10/2024 Discharge date: 03/28/2024  Discharge Diagnoses:  Principal Problem:   Debility   Discharged Condition: {condition:18240}  Significant Diagnostic Studies: DG Ankle 2 Views Right Result Date: 03/14/2024 CLINICAL DATA:  Right ankle pain. EXAM: RIGHT ANKLE - 2 VIEW COMPARISON:  None Available. FINDINGS: Artifact from overlying sock. There is no evidence of fracture, dislocation, or joint effusion. The ankle mortise is preserved. There is no evidence of arthropathy or other focal bone abnormality. Soft tissues are unremarkable. IMPRESSION: Negative radiographs of the right ankle. Electronically Signed   By: Andrea Gasman M.D.   On: 03/14/2024 16:10   DG Chest Port 1 View Result Date: 03/04/2024 CLINICAL DATA:  Status post mitral valve replacement. Pleural effusion. EXAM: PORTABLE CHEST 1 VIEW COMPARISON:  02/22/2024 FINDINGS: Right upper extremity PICC tip overlies the upper SVC. Left-sided dialysis catheter tip overlies the lower SVC. Weighted enteric tube tip below the diaphragm not included in the field of view. Post median sternotomy with prosthetic cardiac valve. The heart is upper normal in size. Stable mild pulmonary edema. Suspected small pleural effusions. Slight increase in atelectasis in the lung bases. No pneumothorax. Overlying skin staples have been removed. IMPRESSION: 1. Stable mild pulmonary edema. Suspected small pleural effusions. 2. Slight increase in atelectasis in the lung bases. Electronically Signed   By: Andrea Gasman M.D.   On: 03/04/2024 16:00    Labs:  Basic Metabolic Panel: Recent Labs  Lab 03/22/24 0916 03/23/24 1004 03/24/24 0539 03/25/24 0518 03/26/24 1501  NA 135 136 138 135 136  K 4.6 4.6 4.4 4.4 4.8  CL 96* 96* 100 94* 95*  CO2 26 24 22 26 27   GLUCOSE 107* 171* 105* 103* 116*  BUN 28* 50* 63* 27* 56*  CREATININE 2.85* 4.10*  4.55* 2.65* 4.82*  CALCIUM  9.6 10.2 9.7 9.7 9.6  MG 1.9 1.9 2.1  --   --   PHOS 3.2 3.9 4.5 3.3 4.8*    CBC: Recent Labs  Lab 03/24/24 0539  WBC 5.8  HGB 9.7*  HCT 29.7*  MCV 99.7  PLT 148*    CBG: Recent Labs  Lab 03/23/24 0613 03/23/24 1616 03/24/24 0601 03/24/24 1828 03/25/24 0613  GLUCAP 94 116* 100* 93 93    Brief HPI:   Tracy Baker is a 69 y.o. female ***   Hospital Course: Tracy Baker was admitted to rehab 03/10/2024 for inpatient therapies to consist of PT, ST and OT at least three hours five days a week. Past admission physiatrist, therapy team and rehab RN have worked together to provide customized collaborative inpatient rehab.   Blood pressures were monitored on TID basis and   Diabetes has been monitored with ac/hs CBG checks and SSI was use prn for tighter BS control.    Rehab course: During patient's stay in rehab weekly team conferences were held to monitor patient's progress, set goals and discuss barriers to discharge. At admission, patient required  He/She  has had improvement in activity tolerance, balance, postural control as well as ability to compensate for deficits. He/She has had improvement in functional use RUE/LUE  and RLE/LLE as well as improvement in awareness       Disposition:  There are no questions and answers to display.         Diet:  Special Instructions:   Allergies as of 03/28/2024       Reactions   Codeine Anaphylaxis, Nausea  And Vomiting   Bactoshield Chg [chlorhexidine  Gluconate] Dermatitis   Chlorhexidine  Dermatitis, Rash   Red Dye #40 (allura Red) Nausea Only     Med Rec must be completed prior to using this [facility]***       Follow-up Information     McDonough, Lauren K, PA-C Follow up.   Specialty: Physician Assistant Why: Call in 1-2 days for post hospital follow up Contact information: [address] [place] [address] [phone]         Lorilee Sven SQUIBB, MD Follow up.    Specialty: Physical Medicine and Rehabilitation Contact information: [address] [place] [address] [phone]         Kerrin Elspeth BROCKS, MD Follow up.   Specialty: Cardiothoracic Surgery Contact information: [address] [place] [address] [phone]         Lorene Lesley CROME, PA-C Follow up.   Specialties: Physician Assistant, Cardiology Contact information: [address] [place] [address] [phone]         Norine Manuelita LABOR, MD Follow up.   Specialty: Nephrology Contact information: [address] [place] [address] [phone]         [facility] Go on 03/27/2024.   Why: Plesae arrive to your first dialysis appointment on 1/22 at 11:30 am.  after this, you will go every tues, thurs, and sat at 12:30pm Contact information: [address] [place] [address] [phone]                 Signed: Sharlet GORMAN Schmitz 03/28/2024, 9:36 AM

## 2024-03-28 NOTE — Progress Notes (Signed)
 Physical Therapy Session Note  Patient Details  Name: Tracy Baker MRN: [id number] Date of Birth: [birth year]  Today's Date: 03/28/2024 PT Individual Time: 1100-1155 PT Individual Time Calculation (min): 55 min   Short Term Goals: Week 2:  PT Short Term Goal 1 (Week 2): STG=LTG due to LOS Week 3:  PT Short Term Goal 1 (Week 3): STG=LTG due to entended LOS  Skilled Therapeutic Interventions/Progress Updates:   Received pt semi-reclined in bed, pt agreeable to PT treatment, and denied any pain during session. Session with emphasis on functional mobility/transfers, generalized strengthening and endurance, dynamic standing balance/coordination, and ambulation. Pt transferred semi-reclined<>sitting R EOB with HOB elevated and use of bedrails with supervision.   Pt performed all transfers with rollator and supervision throughout session. Pt ambulated 168ft x 1, 44ft x 1, 148ft x 1, 36ft x 1, and 128ft x 1 with rollator and close supervision with multiple seated rest breaks on rollator when fatigued. Performed the following seated exercises with emphasis on LE strength: -knee extensions 2x15 bilaterally with 1.5lb ankle weight -hip flexion 2x15 bilaterally with 1.5lb ankle weight -hip abduction with grn TB 2x15 -hamstring curls with grn TB 2x12 bilaterally  Pt then performed blocked practice sit<>stands x10 with CGA but unable to complete without UE support. Returned to room and transferred into supine with supervision. Pt reported feeling worn out - RN arrived to inspect wounds and concluded session with pt semi-reclined in bed with all needs within reach.   Therapy Documentation Precautions:  Precautions Precautions: Fall Recall of Precautions/Restrictions: Impaired Precaution/Restrictions Comments: sternal precautions lifted 1/19 Restrictions Weight Bearing Restrictions Per Provider Order: No Other Position/Activity Restrictions: Cardiac sternal precautions  Therapy/Group: Individual  Therapy [name] [name] PT, DPT 03/28/2024, 6:51 AM

## 2024-03-28 NOTE — Progress Notes (Incomplete)
 Recreational Therapy Session Note  Patient Details  Name: Tracy Baker MRN: [id number] Date of Birth: 1956-01-04 Today's Date: 03/28/2024  Pain: no c/o  Goal:  Pt will discuss new stressors as discharge is approaching and  Skilled Therapeutic Interventions/Progress Updates: ***  Therapy/Group: {TR Therapy/Group:3049008}  Activity Level: {TR Activity Level:3049009} Level of assist: {TR Level of Assist:3049010}  Nicholl Onstott 03/28/2024, 10:52 AM

## 2024-03-28 NOTE — Progress Notes (Addendum)
 Patient ID: Tracy Baker, female   DOB: Jul 27, 1955, 69 y.o.   MRN: [id number]  Reached out to Sonja Molt to follow-up on any updates. No answer, voicemail left with callback number.   Patient has been approved for [facility] [place] location, TTS, 1220. She can start Tuesday the 29th. She will need to arrive first day 11:45am for paperwork.   Update provided to patient's daughter.

## 2024-03-28 NOTE — Plan of Care (Signed)
" °  Problem: Consults Goal: RH GENERAL PATIENT EDUCATION Description: See Patient Education module for education specifics. Outcome: Progressing Goal: Skin Care Protocol Initiated - if Braden Score 18 or less Description: If consults are not indicated, leave blank or document N/A Outcome: Progressing Goal: Nutrition Consult-if indicated Outcome: Progressing Goal: Diabetes Guidelines if Diabetic/Glucose > 140 Description: If diabetic or lab glucose is > 140 mg/dl - Initiate Diabetes/Hyperglycemia Guidelines & Document Interventions  Outcome: Progressing   Problem: RH BOWEL ELIMINATION Goal: RH STG MANAGE BOWEL WITH ASSISTANCE Description: STG Manage Bowel with mod I  Assistance. Outcome: Progressing Goal: RH STG MANAGE BOWEL W/MEDICATION W/ASSISTANCE Description: STG Manage Bowel with Medication with mod I Assistance. Outcome: Progressing   Problem: RH SKIN INTEGRITY Goal: RH STG SKIN FREE OF INFECTION/BREAKDOWN Description: Manage skin w min assist Outcome: Progressing Goal: RH STG MAINTAIN SKIN INTEGRITY WITH ASSISTANCE Description: STG Maintain Skin Integrity With Assistance. Outcome: Progressing Goal: RH STG ABLE TO PERFORM INCISION/WOUND CARE W/ASSISTANCE Description: STG Able To Perform Incision/Wound Care With min Assistance. Outcome: Progressing   Problem: RH SAFETY Goal: RH STG ADHERE TO SAFETY PRECAUTIONS W/ASSISTANCE/DEVICE Description: STG Adhere to Safety Precautions With cues Assistance/Device. Outcome: Progressing   Problem: RH PAIN MANAGEMENT Goal: RH STG PAIN MANAGED AT OR BELOW PT'S PAIN GOAL Description: Pain < 4 with prns Outcome: Progressing   Problem: RH KNOWLEDGE DEFICIT GENERAL Goal: RH STG INCREASE KNOWLEDGE OF SELF CARE AFTER HOSPITALIZATION Description: Patient and family will be able to manage care at discharge using educational resources for medication, skin care and dietary modification independently Outcome: Progressing   "

## 2024-03-29 DIAGNOSIS — I959 Hypotension, unspecified: Secondary | ICD-10-CM | POA: Diagnosis not present

## 2024-03-29 DIAGNOSIS — R5383 Other fatigue: Secondary | ICD-10-CM | POA: Diagnosis not present

## 2024-03-29 DIAGNOSIS — N179 Acute kidney failure, unspecified: Secondary | ICD-10-CM | POA: Diagnosis not present

## 2024-03-29 DIAGNOSIS — R5381 Other malaise: Secondary | ICD-10-CM | POA: Diagnosis not present

## 2024-03-29 LAB — HEPATITIS B CORE ANTIBODY, TOTAL: HEP B CORE AB: NEGATIVE

## 2024-03-29 MED ORDER — HEPARIN SODIUM (PORCINE) 1000 UNIT/ML IJ SOLN
INTRAMUSCULAR | Status: AC
  Filled 2024-03-29: qty 5

## 2024-03-29 MED ORDER — HEPARIN SODIUM (PORCINE) 1000 UNIT/ML DIALYSIS: 40.0000 [IU]/kg | INTRAMUSCULAR | Status: DC | PRN

## 2024-03-29 NOTE — Plan of Care (Signed)
" °  Problem: Consults Goal: RH GENERAL PATIENT EDUCATION Description: See Patient Education module for education specifics. Outcome: Progressing Goal: Skin Care Protocol Initiated - if Braden Score 18 or less Description: If consults are not indicated, leave blank or document N/A Outcome: Progressing Goal: Nutrition Consult-if indicated Outcome: Progressing Goal: Diabetes Guidelines if Diabetic/Glucose > 140 Description: If diabetic or lab glucose is > 140 mg/dl - Initiate Diabetes/Hyperglycemia Guidelines & Document Interventions  Outcome: Progressing   Problem: RH BOWEL ELIMINATION Goal: RH STG MANAGE BOWEL WITH ASSISTANCE Description: STG Manage Bowel with mod I  Assistance. Outcome: Progressing Goal: RH STG MANAGE BOWEL W/MEDICATION W/ASSISTANCE Description: STG Manage Bowel with Medication with mod I Assistance. Outcome: Progressing   Problem: RH SKIN INTEGRITY Goal: RH STG SKIN FREE OF INFECTION/BREAKDOWN Description: Manage skin w min assist Outcome: Progressing Goal: RH STG MAINTAIN SKIN INTEGRITY WITH ASSISTANCE Description: STG Maintain Skin Integrity With Assistance. Outcome: Progressing Goal: RH STG ABLE TO PERFORM INCISION/WOUND CARE W/ASSISTANCE Description: STG Able To Perform Incision/Wound Care With min Assistance. Outcome: Progressing   Problem: RH SAFETY Goal: RH STG ADHERE TO SAFETY PRECAUTIONS W/ASSISTANCE/DEVICE Description: STG Adhere to Safety Precautions With cues Assistance/Device. Outcome: Progressing   Problem: RH PAIN MANAGEMENT Goal: RH STG PAIN MANAGED AT OR BELOW PT'S PAIN GOAL Description: Pain < 4 with prns Outcome: Progressing   Problem: RH KNOWLEDGE DEFICIT GENERAL Goal: RH STG INCREASE KNOWLEDGE OF SELF CARE AFTER HOSPITALIZATION Description: Patient and family will be able to manage care at discharge using educational resources for medication, skin care and dietary modification independently Outcome: Progressing   "

## 2024-03-29 NOTE — Progress Notes (Signed)
 Pt with [facility]; per RN, the pt is leaving for dialysis soon;   corner of dressing reinforced; dressing to be changed while in HD today.

## 2024-03-29 NOTE — Progress Notes (Signed)
 Patient ID: Tracy Baker, female   DOB: [birth year], 69 y.o.   MRN: [id number] [facility] Progress Note   Assessment/ Plan:   1. Acute kidney Injury: She continues to anuric and without any evidence of functional renal recovery.  Has been on dialysis since 02/16/2024 and is status post left IJ TDC by IR on 12/22.  To undertake hemodialysis later today.  She has been approved for hemodialysis at [facility] kidney center on TTS location and can start as early as Tuesday if discharged to skilled nursing facility.  500 cc urine output charted overnight 2.  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  Currently on fluconazole  with lifelong therapeutic duration noted. 3. Hypotension: Blood pressures with decent control on midodrine  10 mg 3 times a day. 4.  Anemia: Will check hemoglobin/hematocrit with labs on hemodialysis today.  Subjective:   Complains of some nausea earlier this morning and is excited to have urinated this morning   Objective:   BP 125/63 (BP Location: Left Arm)   Pulse 64   Temp 97.7 F (36.5 C) (Oral)   Resp 16   Ht 5' 4 (1.626 m)   Wt 67.2 kg   SpO2 99%   BMI 25.43 kg/m   Intake/Output Summary (Last 24 hours) at 03/29/2024 0901 Last data filed at 03/28/2024 1900 Gross per 24 hour  Intake 472 ml  Output 500 ml  Net -28 ml   Weight change: -0.114 kg  Physical Exam: Gen: Working with PT while in wheelchair CVS: Pulse regular rhythm, normal rate, S1 and S2 normal Resp: Clear to auscultation, no rales/rhonchi.  Right IJ TDC in place Abd: Soft, flat, nontender, bowel sounds normal Ext: No lower extremity edema.  Imaging: No results found.  Labs: BMET Recent Labs  Lab 03/22/24 0916 03/23/24 1004 03/24/24 0539 03/25/24 0518 03/26/24 1501  NA 135 136 138 135 136  K 4.6 4.6 4.4 4.4 4.8  CL 96* 96* 100 94* 95*  CO2 26 24 22 26 27   GLUCOSE 107* 171* 105* 103* 116*  BUN 28* 50* 63* 27* 56*   CREATININE 2.85* 4.10* 4.55* 2.65* 4.82*  CALCIUM  9.6 10.2 9.7 9.7 9.6  PHOS 3.2 3.9 4.5 3.3 4.8*   CBC Recent Labs  Lab 03/24/24 0539  WBC 5.8  HGB 9.7*  HCT 29.7*  MCV 99.7  PLT 148*    Medications:     amiodarone   200 mg Oral Daily   apixaban   5 mg Oral BID   ascorbic acid   500 mg Oral Q supper   cholecalciferol   1,000 Units Oral Daily   fluconazole   400 mg Oral Q supper   Followed by   NOREEN ON 05/05/2024] fluconazole   200 mg Oral Daily   Gerhardt's butt cream   Topical BID   hydrocerin   Topical BID   hydrocortisone  cream  1 Application Topical BID   melatonin  5 mg Oral QHS   midodrine   5 mg Oral TID WC   mouth rinse  15 mL Mouth Rinse 4 times per day   pantoprazole   40 mg Oral Daily   sertraline   25 mg Oral QHS    Gordy Blanch, MD 03/29/2024, 9:01 AM

## 2024-03-29 NOTE — Progress Notes (Signed)
 "                                                        PROGRESS NOTE   Subjective/Complaints: No new complaints, recently finished dialysis.  She feels like new dialysis regimen is not causing her as much fatigue.  LBM today.  ROS:  Pt denies SOB, abd pain, CP, N/V/C/D, and vision changes +fatigue-improved  denies dysuria   Objective:   No results found.  No results for input(s): WBC, HGB, HCT, PLT in the last 72 hours.   No results for input(s): NA, K, CL, CO2, GLUCOSE, BUN, CREATININE, CALCIUM  in the last 72 hours.   Intake/Output Summary (Last 24 hours) at 03/29/2024 1746 Last data filed at 03/29/2024 1651 Gross per 24 hour  Intake 118 ml  Output 1500 ml  Net -1382 ml       Wound 03/10/24 2051 Pressure Injury Sacrum Right Stage 2 -  Partial thickness loss of dermis presenting as a shallow open injury with a red, pink wound bed without slough. (Active)     Wound 03/15/24 1507 Pressure Injury Foot Left;Posterior Deep Tissue Pressure Injury - Purple or maroon localized area of discolored intact skin or blood-filled blister due to damage of underlying soft tissue from pressure and/or shear. (Active)    Physical Exam: Vital Signs Blood pressure 108/63, pulse 84, temperature 98 F (36.7 C), resp. rate 14, height 5' 4 (1.626 m), weight 67.2 kg, SpO2 98%.  General: awake, alert, appropriate, NAD, laying in bed appears comfortable; vitals reviewed HENT: conjugate gaze; oropharynx dry CV: RRR Pulmonary: CTA B/L; nonlabored GI: soft, NT, ND, (+)BS normoactive Extr: No LE edema. Psychiatric: appropriate, cooperative Neurological: Ox3  Skin: has chest wall incision, tunneled catheter on left chest wall, right buttock wound and bilateral heel wounds--> left heel now with quarter sized stage II--covered +rash on chest, erythematous    Neuro: Alert,4-/5 strength throughout, good attention, sensation is intact, +bilateral hand tremors, stable  1/23    Assessment/Plan: 1. Functional deficits which require 3+ hours per day of interdisciplinary therapy in a comprehensive inpatient rehab setting. Physiatrist is providing close team supervision and 24 hour management of active medical problems listed below. Physiatrist and rehab team continue to assess barriers to discharge/monitor patient progress toward functional and medical goals  Care Tool:  Bathing    Body parts bathed by patient: Right arm, Left arm, Chest, Abdomen, Front perineal area, Buttocks, Right upper leg, Left upper leg, Right lower leg, Left lower leg, Face   Body parts bathed by helper: Buttocks, Right lower leg, Left lower leg     Bathing assist Assist Level: Supervision/Verbal cueing     Upper Body Dressing/Undressing Upper body dressing   What is the patient wearing?: Pull over shirt    Upper body assist Assist Level: Supervision/Verbal cueing    Lower Body Dressing/Undressing Lower body dressing      What is the patient wearing?: Underwear/pull up, Pants     Lower body assist Assist for lower body dressing: Supervision/Verbal cueing     Toileting Toileting    Toileting assist Assist for toileting: Set up assist     Transfers Chair/bed transfer  Transfers assist  Chair/bed transfer activity did not occur: Safety/medical concerns (pain, fatigue, dizziness)  Chair/bed transfer assist level: Supervision/Verbal cueing  Locomotion Ambulation   Ambulation assist   Ambulation activity did not occur: Safety/medical concerns (pain, fatigue, dizziness)  Assist level: Supervision/Verbal cueing Assistive device: Rollator Max distance: 152ft   Walk 10 feet activity   Assist  Walk 10 feet activity did not occur: Safety/medical concerns (pain, fatigue, dizziness)  Assist level: Supervision/Verbal cueing Assistive device: Rollator   Walk 50 feet activity   Assist Walk 50 feet with 2 turns activity did not occur: Safety/medical  concerns (pain, fatigue, dizziness)  Assist level: Supervision/Verbal cueing Assistive device: Rollator    Walk 150 feet activity   Assist Walk 150 feet activity did not occur: Safety/medical concerns (fatigue)  Assist level: Supervision/Verbal cueing Assistive device: Rollator    Walk 10 feet on uneven surface  activity   Assist Walk 10 feet on uneven surfaces activity did not occur: Safety/medical concerns (pain, fatigue, dizziness)   Assist level: Contact Guard/Touching assist Assistive device: Rollator   Wheelchair     Assist Is the patient using a wheelchair?: Yes Type of Wheelchair: Manual Wheelchair activity did not occur: Safety/medical concerns (pain, fatigue, dizziness)         Wheelchair 50 feet with 2 turns activity    Assist    Wheelchair 50 feet with 2 turns activity did not occur: Safety/medical concerns (pain, fatigue, dizziness)       Wheelchair 150 feet activity     Assist  Wheelchair 150 feet activity did not occur: Safety/medical concerns (pain, fatigue, dizziness)       Blood pressure 108/63, pulse 84, temperature 98 F (36.7 C), resp. rate 14, height 5' 4 (1.626 m), weight 67.2 kg, SpO2 98%.  Medical Problem List and Plan: 1. Functional deficits secondary to debility after prolonged hospital stay related to mitral valve vegetations and MV repair and ultimate replacement.             -patient may  shower             -ELOS/Goals: 15 days, supervision goals with PT, OT, SLP  Continue CIR PT, OT and SLP Continue Eliquis  Full code but no chest compressions Will call son to update regarding her progress Discussed SNF placement  2. Hypotension: medications reviewed and is on amiodarone , d/c clonazepam , continue compression garments, midodrine  decreased to 5mg  since hypotension has improved and patient is complaining of dry mouth, continue abdominal binder, discussed with patient and son that this is likely to be worst  post-dialysis  -1/24 stable continue current regimen    03/29/2024    4:51 PM 03/29/2024    4:50 PM 03/29/2024    4:00 PM  Vitals with BMI  Systolic 108 106 893  Diastolic 63 64 70  Pulse 84 83 83     3. Pain: A) Bilateral heel pain: d/ced Lyrica  because this pain improves B) Right ankle pain: ASO ordered, placed nursing for heat application in the morning for stiffness and ice in the afternoon to reduce inflammation. XR reviewed and is stable  4. Insomnia: d/c remeron , scheduled melatonin  5. Neuropsych/cognition: This patient is capable of making decisions on her own behalf.  6. Skin/Wound Care: Air mattress overlay for pressure relief.             --continue Ensure and prosource             --added Vitamin C  and Zinc to promote wound healing.   1/11 left heel appears to be evolution of what was present on admit   -continue above, prevalons   -discussed  with pt importance of pressure relief, nutrition  Sarna and hydrocortisone  ordered for rash on chest  7. Decreased appetite: resolved, d/c megace , weaning mirtazepine  8.Fungal endocarditis: continue oral fluconazole  indefinitely as per ID, discussed with patient and family  9. Mitral valve replacement with redo: has completed sternal precautions  10. AKI with dialysis initiated 02/16/24: On HD MWF at the end of the day to help with tolerance of therapy             Cr reviewed and is improving             --CLIP on hold. Needs to be able to sit up in a chair for HD.   -1/24 patient had hemodialysis today, reviewed nephrology note   11. A fib: Monitor HR TID--continue amiodarone  and eliquis .   12. Anemia of critical illness: Continue to monitor Hgb with HD labs   13.Enterobacter cloaca UTI: completed 3 day course of Bactrim   14. Daytime fatigue:  continue D3 daily Asked nephrology whether renal function panel can be discontinued so she does not need to be waken daily at 5am, daily weights also changed to 8am Discussed  that this will likely continue to be worst following dialysis days B12 ordered (one time injection given red dye allergy), d/c seroquel , change lyrica  to HS, decreased therapy to 15/7, d/c clonazepam  at night, discussed that remeron  could contribute but will maintain for now given insomnia and to help stimulate appetite, melatonin scheduled at night to help with insomnia 1/24 patient feels like new dialysis regimen has improved her fatigue  15. Hyponatremia: Na level reviewed and this improved, continue to monitor Na levels with HD  16. Bilateral hand tremors: liver enzymes reviewed and are wnl, discussed that these seem to worsen when patient is anxious  17. Nausea: improved    LOS: 19 days A FACE TO FACE EVALUATION WAS PERFORMED  Murray Collier 03/29/2024, 5:46 PM     "

## 2024-03-29 NOTE — Progress Notes (Shared)
 Physical Therapy Discharge Summary  Patient Details  Name: Tracy Baker MRN: [id number] Date of Birth: 08-17-1955  Date of Discharge from PT service:{Time; dates multiple:[id number]}  Patient has met 8 of 8 long term goals due to improved activity tolerance, improved balance, improved postural control, increased strength, decreased pain, ability to compensate for deficits, improved awareness, and improved coordination.  Patient to discharge at an ambulatory level Supervision using rollator. Patient's care partner unavailable to provide the necessary physical assistance at discharge. Pt is unable to have 24/7 supervision initially upon D/C and family ultimately requesting SNF placement. However, current recommendations are for HHPT services.   All goals met  Recommendation:  Patient will benefit from ongoing skilled PT services in home health setting to continue to advance safe functional mobility, address ongoing impairments in transfers, generalized strengthening and endurance, dynamic standing balance/coordination, ambulation, and to minimize fall risk.  Equipment: Rollator  Reasons for discharge: treatment goals met and discharge from hospital  Patient/family agrees with progress made and goals achieved: Yes  PT Discharge Precautions/Restrictions Precautions Precautions: Fall Precaution/Restrictions Comments: sternal precautions lifted 1/19 Restrictions Weight Bearing Restrictions Per Provider Order: No Pain Interference Pain Interference Pain Effect on Sleep: 1. Rarely or not at all Pain Interference with Therapy Activities: 1. Rarely or not at all Pain Interference with Day-to-Day Activities: 1. Rarely or not at all Cognition Overall Cognitive Status: Within Functional Limits for tasks assessed Arousal/Alertness: Awake/alert Orientation Level: Oriented X4 Memory: Appears intact Awareness: Appears intact Problem Solving: Appears intact Safety/Judgment: Appears  intact Comments: anxious; but much improved Sensation Sensation Light Touch: Appears Intact Hot/Cold: Not tested Proprioception: Appears Intact Stereognosis: Not tested Coordination Gross Motor Movements are Fluid and Coordinated: Yes Fine Motor Movements are Fluid and Coordinated: Not tested Coordination and Movement Description: limited by global weakness/deconditioning and fatigue Heel Shin Test: decreased ROM bilaterally Motor  Motor Motor: Within Functional Limits  Mobility Bed Mobility Bed Mobility: Rolling Right;Rolling Left;Sit to Supine;Supine to Sit Rolling Right: Independent with assistive device Rolling Left: Independent with assistive device Supine to Sit: Independent with assistive device Sit to Supine: Independent with assistive device Transfers Transfers: Sit to Stand;Stand to Sit;Stand Pivot Transfers Sit to Stand: Supervision/Verbal cueing Stand to Sit: Supervision/Verbal cueing Stand Pivot Transfers: Supervision/Verbal cueing Stand Pivot Transfer Details: Verbal cues for precautions/safety Stand Pivot Transfer Details (indicate cue type and reason): occasional cues for rollator safety Transfer (Assistive device): Rollator Locomotion  Gait Ambulation: Yes Gait Assistance: Supervision/Verbal cueing Gait Distance (Feet): 190 Feet Assistive device: Rollator Gait Assistance Details: Verbal cues for precautions/safety Gait Assistance Details: occasional cues for rollator safety Gait Gait: Yes Gait Pattern: Impaired Gait Pattern: Poor foot clearance - left;Poor foot clearance - right;Narrow base of support Gait velocity: decreased Stairs / Additional Locomotion Stairs: Yes Stairs Assistance: Contact Guard/Touching assist Stair Management Technique: One rail Left Number of Stairs: 4 Height of Stairs: 6 Ramp: Supervision/Verbal cueing (rollator) Wheelchair Mobility Wheelchair Mobility: No  Trunk/Postural Assessment  Cervical Assessment Cervical  Assessment: Exceptions to [facility] (forward head) Thoracic Assessment Thoracic Assessment: Exceptions to [facility] (rounded shoulders) Lumbar Assessment Lumbar Assessment: Exceptions to [facility] (flexible posterior pelvic tilt) Postural Control Postural Control: Within Functional Limits  Balance Balance Balance Assessed: Yes Static Sitting Balance Static Sitting - Balance Support: Feet supported;No upper extremity supported Static Sitting - Level of Assistance: 7: Independent Dynamic Sitting Balance Dynamic Sitting - Balance Support: Feet supported;No upper extremity supported Dynamic Sitting - Level of Assistance: 6: Modified independent (Device/Increase time) Static Standing Balance Static Standing - Balance  Support: Bilateral upper extremity supported;During functional activity (rollator) Static Standing - Level of Assistance: 5: Stand by assistance (supervision) Dynamic Standing Balance Dynamic Standing - Balance Support: Bilateral upper extremity supported;During functional activity (rollator) Dynamic Standing - Level of Assistance: 5: Stand by assistance (supervision) Dynamic Standing - Comments: with transfers and ambulation Extremity Assessment  RLE Assessment RLE Assessment: Exceptions to [facility] General Strength Comments: tested sitting in WC RLE Strength Right Hip Flexion: 3+/5 Right Hip ABduction: 4-/5 Right Hip ADduction: 4-/5 Right Knee Flexion: 3+/5 Right Knee Extension: 3+/5 Right Ankle Dorsiflexion: 4-/5 Right Ankle Plantar Flexion: 4-/5 LLE Assessment LLE Assessment: Exceptions to [facility] General Strength Comments: tested sitting in WC LLE Strength Left Hip Flexion: 3+/5 Left Hip ABduction: 4-/5 Left Hip ADduction: 4-/5 Left Knee Flexion: 3+/5 Left Knee Extension: 3+/5 Left Ankle Dorsiflexion: 4-/5 Left Ankle Plantar Flexion: 4-/5   [name] [name] PT, DPT 03/29/2024, 7:10 AM

## 2024-03-29 NOTE — Progress Notes (Signed)
" °   03/29/24 1651  Vitals  BP 108/63  MAP (mmHg) 76  Oxygen Therapy  O2 Device Room Air  Patient Activity (if Appropriate) In bed  Pulse Oximetry Type Continuous  During Treatment Monitoring  Blood Flow Rate (mL/min) 0 mL/min  Arterial Pressure (mmHg) -1.61 mmHg  Venous Pressure (mmHg) -2.22 mmHg  TMP (mmHg) 6.87 mmHg  Ultrafiltration Rate (mL/min) 445 mL/min  Dialysate Flow Rate (mL/min) 300 ml/min  Dialysate Potassium Concentration 3  Dialysate Calcium  Concentration 2.5  Duration of HD Treatment -hour(s) 3.5 hour(s)  Cumulative Fluid Removed (mL) per Treatment  998.52  Post Treatment  Dialyzer Clearance Lightly streaked  Hemodialysis Intake (mL) 0 mL  Liters Processed 73.5  Fluid Removed (mL) 1000 mL  Hemodialysis Catheter Left Internal jugular Double lumen Permanent (Tunneled)  Placement Date/Time: 02/25/24 1348   Serial / Lot #: [id number]  Expiration Date: 08/03/28  Time Out: Correct patient;Correct site;Correct procedure  Maximum sterile barrier precautions: Hand hygiene;Cap;Mask;Sterile gown;Sterile gloves;Large sterile s...  Site Condition No complications  Blue Lumen Status Heparin  locked;Antimicrobial dead end cap  Red Lumen Status Heparin  locked;Antimicrobial dead end cap  Catheter fill solution Heparin  1000 units/ml  Catheter fill volume (Arterial) 2.1 cc  Catheter fill volume (Venous) 2.1  Dressing Type Transparent  Dressing Status Antimicrobial disc/dressing in place;Clean, Dry, Intact  Interventions New dressing;Dressing changed  Drainage Description None  Dressing Change Due 04/04/24  Post treatment catheter status Capped and Clamped    "

## 2024-03-29 NOTE — Progress Notes (Signed)
 Physical Therapy Session Note  Patient Details  Name: Tracy Baker MRN: [id number] Date of Birth: [birth year]  Today's Date: 03/29/2024 PT Individual Time: 0831-0925  PT Individual Time Calculation (min): 54 min  PT Missed Time: 45 minutes PT Missed Time Reason: Dialysis  Short Term Goals: Week 2:  PT Short Term Goal 1 (Week 2): STG=LTG due to LOS Week 3:  PT Short Term Goal 1 (Week 3): STG=LTG due to entended LOS  Skilled Therapeutic Interventions/Progress Updates:   Treatment Session 1 Received pt semi-reclined in bed not expecting to see therapist (clock in her room is wrong). Pt agreeable to PT treatment and denied any pain during session but reported slight nausea (RN notified and present to administer medication). Session with emphasis on functional mobility/transfers, generalized strengthening and endurance, dynamic standing balance/coordination, and ambulation. Pt performed bed mobility x 2 trials throughout session with supervision using bed features. Pt continues to require ++ time to get going but no physical assist needed.   Pt performed all transfers with rollator and supervision throughout session. Pt ambulated to sink with rollator and supervision and sat in WC at sink and washed face/brushed teeth mod I, then stood at sink with supervision to wash lower body. Pt ambulated 160ft x 2 trials with rollator and supervision to main therapy gym. Worked on dynamic standing balance/coordination playing horseshoe using RUE x 3 trials without UE support and close supervision for balance. Returned to room and requested to return to bed. Concluded session with pt semi-reclined in bed with all needs within reach.   Treatment Session 2 Pt off unit for dialysis. Will attempt to make up missed time as pt becomes available. 45 minutes missed of skilled physical therapy.    Therapy Documentation Precautions:  Precautions Precautions: Fall Recall of Precautions/Restrictions:  Impaired Precaution/Restrictions Comments: sternal precautions lifted 1/19 Restrictions Weight Bearing Restrictions Per Provider Order: No Other Position/Activity Restrictions: Cardiac sternal precautions  Therapy/Group: Individual Therapy [name] [name] PT, DPT 03/29/2024, 6:39 AM

## 2024-03-29 NOTE — Progress Notes (Signed)
 Occupational Therapy Session Note  Patient Details  Name: Tracy Baker MRN: [id number] Date of Birth: Mar 29, 1955  Today's Date: 03/29/2024 OT Individual Time: 8884-8841 OT Individual Time Calculation (min): 43 min    Short Term Goals: Week 1:  OT Short Term Goal 1 (Week 1): Pt will complete sit > stand with CGA using LRAD OT Short Term Goal 1 - Progress (Week 1): Met OT Short Term Goal 2 (Week 1): Pt will complete toilet transfer with min A using LRAD OT Short Term Goal 2 - Progress (Week 1): Met OT Short Term Goal 3 (Week 1): Pt will complete 2/3 toileting steps with CGA for balance OT Short Term Goal 3 - Progress (Week 1): Met OT Short Term Goal 4 (Week 1): Pt will adhere to sternal precautions 100% for 2 consecutive sessions with supervision OT Short Term Goal 4 - Progress (Week 1): Progressing toward goal Week 2:  OT Short Term Goal 1 (Week 2): STG = LTG due to ELOS Week 3:  OT Short Term Goal 1 (Week 3): STG = LTG (Continue progressing towards LTGs)   Skilled Therapeutic Interventions/Progress Updates:    Pt in bed at time of session, stating she just took herself to the bathroom not long ago. No pain. Nursing present for med pass at this time - relayed early HD today. Pt extended time for rapport building as pt anxious, also discussion regarding SNF and answering questions as able. Pt performing bed mob with mod I, functional mob room <> ADL apartment with rollator and 1 recovery break at door entrance, ed on parking against a surface and locking brakes. Pt ed on rollator safety, uses for ADL/IADL, etc. In ADL apartment focused on training for work simplification and energy conservation techniques for placement of items, seated tasks, etc. Back in room set up bed level call bell in reach all needs met.   Therapy Documentation Precautions:  Precautions Precautions: Fall Recall of Precautions/Restrictions: Impaired Precaution/Restrictions Comments: sternal precautions lifted  1/19 Restrictions Weight Bearing Restrictions Per Provider Order: No Other Position/Activity Restrictions: Cardiac sternal precautions     Therapy/Group: Individual Therapy  Chiquita JAYSON Hopping 03/29/2024, 7:32 AM

## 2024-03-30 DIAGNOSIS — R5383 Other fatigue: Secondary | ICD-10-CM | POA: Diagnosis not present

## 2024-03-30 DIAGNOSIS — R5381 Other malaise: Secondary | ICD-10-CM | POA: Diagnosis not present

## 2024-03-30 DIAGNOSIS — I959 Hypotension, unspecified: Secondary | ICD-10-CM | POA: Diagnosis not present

## 2024-03-30 NOTE — Progress Notes (Signed)
 "                                                        PROGRESS NOTE   Subjective/Complaints: No new complaints or concerns this morning.  She continues to report improved energy-attributed to change in dialysis parameters.  ROS:  Pt denies fever, chills, SOB, abd pain, CP, N/V/C/D, and vision changes +fatigue-improved  denies dysuria   Objective:   No results found.  No results for input(s): WBC, HGB, HCT, PLT in the last 72 hours.   No results for input(s): NA, K, CL, CO2, GLUCOSE, BUN, CREATININE, CALCIUM  in the last 72 hours.   Intake/Output Summary (Last 24 hours) at 03/30/2024 1041 Last data filed at 03/30/2024 0600 Gross per 24 hour  Intake --  Output 1001 ml  Net -1001 ml       Wound 03/10/24 2051 Pressure Injury Sacrum Right Stage 2 -  Partial thickness loss of dermis presenting as a shallow open injury with a red, pink wound bed without slough. (Active)     Wound 03/15/24 1507 Pressure Injury Foot Left;Posterior Deep Tissue Pressure Injury - Purple or maroon localized area of discolored intact skin or blood-filled blister due to damage of underlying soft tissue from pressure and/or shear. (Active)    Physical Exam: Vital Signs Blood pressure 117/65, pulse 75, temperature 97.6 F (36.4 C), temperature source Oral, resp. rate 15, height 5' 4 (1.626 m), weight 67.2 kg, SpO2 97%.  General: awake, alert, appropriate, NAD, laying in bed appears comfortable; vitals reviewed HENT: conjugate gaze; oropharynx moist CV: RRR Pulmonary: CTA B/L; nonlabored GI: soft, NT, ND, (+)BS normoactive Extr: No LE edema. Psychiatric: appropriate, cooperative Neurological: Ox3  Skin: has chest wall incision, tunneled catheter on left chest wall, right buttock wound and bilateral heel wounds--> left heel now with quarter sized stage II--covered +rash on chest, erythematous-did not visualize today    Neuro: Alert,4-/5 strength throughout, good  attention, sensation is intact, +bilateral hand tremors, stable 1/25    Assessment/Plan: 1. Functional deficits which require 3+ hours per day of interdisciplinary therapy in a comprehensive inpatient rehab setting. Physiatrist is providing close team supervision and 24 hour management of active medical problems listed below. Physiatrist and rehab team continue to assess barriers to discharge/monitor patient progress toward functional and medical goals  Care Tool:  Bathing    Body parts bathed by patient: Right arm, Left arm, Chest, Abdomen, Front perineal area, Buttocks, Right upper leg, Left upper leg, Right lower leg, Left lower leg, Face   Body parts bathed by helper: Buttocks, Right lower leg, Left lower leg     Bathing assist Assist Level: Supervision/Verbal cueing     Upper Body Dressing/Undressing Upper body dressing   What is the patient wearing?: Pull over shirt    Upper body assist Assist Level: Supervision/Verbal cueing    Lower Body Dressing/Undressing Lower body dressing      What is the patient wearing?: Underwear/pull up, Pants     Lower body assist Assist for lower body dressing: Supervision/Verbal cueing     Toileting Toileting    Toileting assist Assist for toileting: Set up assist     Transfers Chair/bed transfer  Transfers assist  Chair/bed transfer activity did not occur: Safety/medical concerns (pain, fatigue, dizziness)  Chair/bed transfer assist level: Supervision/Verbal cueing  Locomotion Ambulation   Ambulation assist   Ambulation activity did not occur: Safety/medical concerns (pain, fatigue, dizziness)  Assist level: Supervision/Verbal cueing Assistive device: Rollator Max distance: 164ft   Walk 10 feet activity   Assist  Walk 10 feet activity did not occur: Safety/medical concerns (pain, fatigue, dizziness)  Assist level: Supervision/Verbal cueing Assistive device: Rollator   Walk 50 feet activity   Assist  Walk 50 feet with 2 turns activity did not occur: Safety/medical concerns (pain, fatigue, dizziness)  Assist level: Supervision/Verbal cueing Assistive device: Rollator    Walk 150 feet activity   Assist Walk 150 feet activity did not occur: Safety/medical concerns (fatigue)  Assist level: Supervision/Verbal cueing Assistive device: Rollator    Walk 10 feet on uneven surface  activity   Assist Walk 10 feet on uneven surfaces activity did not occur: Safety/medical concerns (pain, fatigue, dizziness)   Assist level: Contact Guard/Touching assist Assistive device: Rollator   Wheelchair     Assist Is the patient using a wheelchair?: Yes Type of Wheelchair: Manual Wheelchair activity did not occur: Safety/medical concerns (pain, fatigue, dizziness)         Wheelchair 50 feet with 2 turns activity    Assist    Wheelchair 50 feet with 2 turns activity did not occur: Safety/medical concerns (pain, fatigue, dizziness)       Wheelchair 150 feet activity     Assist  Wheelchair 150 feet activity did not occur: Safety/medical concerns (pain, fatigue, dizziness)       Blood pressure 117/65, pulse 75, temperature 97.6 F (36.4 C), temperature source Oral, resp. rate 15, height 5' 4 (1.626 m), weight 67.2 kg, SpO2 97%.  Medical Problem List and Plan: 1. Functional deficits secondary to debility after prolonged hospital stay related to mitral valve vegetations and MV repair and ultimate replacement.             -patient may  shower             -ELOS/Goals: 15 days, supervision goals with PT, OT, SLP  Continue CIR PT, OT and SLP Continue Eliquis  Full code but no chest compressions Will call son to update regarding her progress Discussed SNF placement  2. Hypotension: medications reviewed and is on amiodarone , d/c clonazepam , continue compression garments, midodrine  decreased to 5mg  since hypotension has improved and patient is complaining of dry mouth, continue  abdominal binder, discussed with patient and son that this is likely to be worst post-dialysis  -1/24-25 stable continue current regimen    03/30/2024    4:19 AM 03/29/2024    8:18 PM 03/29/2024    5:58 PM  Vitals with BMI  Weight 148 lbs 2 oz    BMI 25.42    Systolic 117 122 881  Diastolic 65 84 85  Pulse 75 75 84     3. Pain: A) Bilateral heel pain: d/ced Lyrica  because this pain improves B) Right ankle pain: ASO ordered, placed nursing for heat application in the morning for stiffness and ice in the afternoon to reduce inflammation. XR reviewed and is stable  4. Insomnia: d/c remeron , scheduled melatonin  5. Neuropsych/cognition: This patient is capable of making decisions on her own behalf.  6. Skin/Wound Care: Air mattress overlay for pressure relief.             --continue Ensure and prosource             --added Vitamin C  and Zinc to promote wound healing.   1/11 left heel appears to  be evolution of what was present on admit   -continue above, prevalons   -discussed with pt importance of pressure relief, nutrition  Sarna and hydrocortisone  ordered for rash on chest  7. Decreased appetite: resolved, d/c megace , weaning mirtazepine  8.Fungal endocarditis: continue oral fluconazole  indefinitely as per ID, discussed with patient and family  9. Mitral valve replacement with redo: has completed sternal precautions  10. AKI with dialysis initiated 02/16/24: On HD MWF at the end of the day to help with tolerance of therapy             Cr reviewed and is improving             --CLIP on hold. Needs to be able to sit up in a chair for HD.   -1/24 patient had hemodialysis today, reviewed nephrology note   11. A fib: Monitor HR TID--continue amiodarone  and eliquis .   12. Anemia of critical illness: Continue to monitor Hgb with HD labs   13.Enterobacter cloaca UTI: completed 3 day course of Bactrim   14. Daytime fatigue:  continue D3 daily Asked nephrology whether renal  function panel can be discontinued so she does not need to be waken daily at 5am, daily weights also changed to 8am Discussed that this will likely continue to be worst following dialysis days B12 ordered (one time injection given red dye allergy), d/c seroquel , change lyrica  to HS, decreased therapy to 15/7, d/c clonazepam  at night, discussed that remeron  could contribute but will maintain for now given insomnia and to help stimulate appetite, melatonin scheduled at night to help with insomnia 1/24-25  patient feels like new dialysis regimen has improved her fatigue  15. Hyponatremia: Na level reviewed and this improved, continue to monitor Na levels with HD  16. Bilateral hand tremors: liver enzymes reviewed and are wnl, discussed that these seem to worsen when patient is anxious  17. Nausea: improved    LOS: 20 days A FACE TO FACE EVALUATION WAS PERFORMED  Murray Collier 03/30/2024, 10:41 AM     "

## 2024-03-31 DIAGNOSIS — D6489 Other specified anemias: Secondary | ICD-10-CM | POA: Insufficient documentation

## 2024-03-31 DIAGNOSIS — R5381 Other malaise: Secondary | ICD-10-CM | POA: Diagnosis not present

## 2024-03-31 DIAGNOSIS — L8990 Pressure ulcer of unspecified site, unspecified stage: Secondary | ICD-10-CM | POA: Insufficient documentation

## 2024-03-31 DIAGNOSIS — M25571 Pain in right ankle and joints of right foot: Secondary | ICD-10-CM | POA: Insufficient documentation

## 2024-03-31 DIAGNOSIS — I4891 Unspecified atrial fibrillation: Secondary | ICD-10-CM | POA: Insufficient documentation

## 2024-03-31 NOTE — Progress Notes (Signed)
 Patient ID: Tracy Baker, female   DOB: 11-11-1955, 69 y.o.   MRN: [id number]  Patient's insurance asking for a Peer to Peer for acceptance to the facility. Dr. Lorilee made aware and will initiate Peer to Peer before 12pm today per insurance request. Will await update.   [name] asked if discharge could be pushed back. SW engaged team who informed her they will be unable to extend any further. SW provided an update to [name] about the unchanging discharge date of Tuesday 01/27 to [facility] via personal transport.   Will provide further updates as they become available.

## 2024-03-31 NOTE — Progress Notes (Addendum)
 Patient ID: Tracy Baker, female   DOB: [birth year], 69 y.o.   MRN: [id number]  Peer to Peer Results: Insurance denied  patient to go to any SNF due to the patient being at supervision level.  SW has reached out to [name] and provided her with an update.  SW informed them that they do have the right to appeal and provided them with the appeal number in case they want to appeal the insurance decision.  Appeal: [phone] FAX: [phone]  A voicemail was also left with Rob with an update.   The discharge plan reverts back to the original plan of the patient returning home at supervision level.   Patient's dialysis location will have to be adjusted once again to accommodate for patients needs. She lives in [place] and will need to be established with the dialysis clinic location in [place].   Will await for responses from the family.

## 2024-03-31 NOTE — Progress Notes (Signed)
 Physical Therapy Note  Patient Details  Name: Tracy Baker MRN: [id number] Date of Birth: 25-Jul-1955 Today's Date: 03/31/2024    Due to the [place]  state of emergency, patients may not receive 3.0 hours of therapy services.    [name] 03/31/2024, 10:54 AM

## 2024-03-31 NOTE — Progress Notes (Signed)
 Physical Therapy Session Note  Patient Details  Name: Tracy Baker MRN: [id number] Date of Birth: 07/27/1955  Today's Date: 03/31/2024 PT Individual Time: 1031-1102 PT Individual Time Calculation (min): 31 min   Short Term Goals: Week 1:  PT Short Term Goal 1 (Week 1): pt will perform bed mobility with min A consistantly PT Short Term Goal 1 - Progress (Week 1): Met PT Short Term Goal 2 (Week 1): pt will perform bed<>chair transfers with LRAD and CGA PT Short Term Goal 2 - Progress (Week 1): Met PT Short Term Goal 3 (Week 1): pt will ambulate 47ft with LRAD and min A PT Short Term Goal 3 - Progress (Week 1): Met  Skilled Therapeutic Interventions/Progress Updates:  Patient supine in bed on entrance to room. Patient alert and agreeable to PT session.   Patient with no pain complaint at start of session. Relates that she will be leaving tomorrow to go to SNF for further therapy prior to going home. Is desperate for shower  but understands that she cannot shower while she has HD catheter in L Internal Jugular vein.   Therapeutic Activity: Bed Mobility: Pt performed supine > sit EOB with distant supervision. No cueing required.  Transfers: Pt performed sit<>stand, stand pivot, and toilet transfers throughout session with supervision. Demos increase in awareness for chest precautions with move of hands from rollator to thighs during session. Provided vc/ tc for increased hip hinge rather than trunk flexion for improved technique in rise to stand. Is able to manage clothing and provide pericare for self during toilet transfers. Ambulatory transfer from toilet to sink using rollator with SBA. Pt does discard rollator to side of sink and requests to sit in front of sink in w/c. Reminded pt re: use of rollator for seated breaks and activities. Is able to perform light washing and brushing of teeth while seated at sink with Mod I.   Gait Training:  Pt ambulated 100' x2 using rollator with  supervision provided d/t pt's relating feeling of fatigue at start of session. Provided vc/ tc for locking of brakes prior to all transfers and to not discard rollator to side prior to sitting.   Patient supine in bed at end of session with brakes locked, bed alarm set, and all needs within reach. Nephrologist arriving to room for rounding on pt for dialysis needs.    Therapy Documentation Precautions:  Precautions Precautions: Fall Recall of Precautions/Restrictions: Impaired Precaution/Restrictions Comments: sternal precautions lifted 1/19 Restrictions Weight Bearing Restrictions Per Provider Order: No Other Position/Activity Restrictions: Cardiac sternal precautions  Pain:  No pain complaint this session.   Therapy/Group: Individual Therapy   [name] PT, DPT, CSRS 03/31/2024, 4:40 PM

## 2024-03-31 NOTE — Progress Notes (Signed)
 "                                                        PROGRESS NOTE   Subjective/Complaints: No new complaints this morning She thought d/c was on Thursday, reviewed SW note with her that d/c plan is for Tues, messaged SW and I will have to complete peer to peer with her facility today  ROS:  Pt denies fever, chills, SOB, abd pain, CP, N/V/C/D, and vision changes +fatigue-improved  denies dysuria   Objective:   No results found.  No results for input(s): WBC, HGB, HCT, PLT in the last 72 hours.   No results for input(s): NA, K, CL, CO2, GLUCOSE, BUN, CREATININE, CALCIUM  in the last 72 hours.   Intake/Output Summary (Last 24 hours) at 03/31/2024 0932 Last data filed at 03/30/2024 1200 Gross per 24 hour  Intake 177 ml  Output --  Net 177 ml       Wound 03/10/24 2051 Pressure Injury Sacrum Right Stage 2 -  Partial thickness loss of dermis presenting as a shallow open injury with a red, pink wound bed without slough. (Active)     Wound 03/15/24 1507 Pressure Injury Foot Left;Posterior Deep Tissue Pressure Injury - Purple or maroon localized area of discolored intact skin or blood-filled blister due to damage of underlying soft tissue from pressure and/or shear. (Active)    Physical Exam: Vital Signs Blood pressure 132/72, pulse 78, temperature 98 F (36.7 C), resp. rate 18, height 5' 4 (1.626 m), weight 67 kg, SpO2 99%.  General: awake, alert, appropriate, NAD, laying in bed appears comfortable; vitals reviewed HENT: conjugate gaze; oropharynx moist CV: RRR Pulmonary: CTA B/L; nonlabored GI: soft, NT, ND, (+)BS normoactive Extr: No LE edema. Psychiatric: appropriate, cooperative Skin: has chest wall incision, tunneled catheter on left chest wall, right buttock wound and bilateral heel wounds--> left heel now with quarter sized stage II--covered +rash on chest, erythematous-did not visualize today    Neuro: Alert and oriented ,4-/5 strength  throughout, good attention, sensation is intact, +bilateral hand tremors, stable 1/26    Assessment/Plan: 1. Functional deficits which require 3+ hours per day of interdisciplinary therapy in a comprehensive inpatient rehab setting. Physiatrist is providing close team supervision and 24 hour management of active medical problems listed below. Physiatrist and rehab team continue to assess barriers to discharge/monitor patient progress toward functional and medical goals  Care Tool:  Bathing    Body parts bathed by patient: Right arm, Left arm, Chest, Abdomen, Front perineal area, Buttocks, Right upper leg, Left upper leg, Right lower leg, Left lower leg, Face   Body parts bathed by helper: Buttocks, Right lower leg, Left lower leg     Bathing assist Assist Level: Supervision/Verbal cueing     Upper Body Dressing/Undressing Upper body dressing   What is the patient wearing?: Pull over shirt    Upper body assist Assist Level: Supervision/Verbal cueing    Lower Body Dressing/Undressing Lower body dressing      What is the patient wearing?: Underwear/pull up, Pants     Lower body assist Assist for lower body dressing: Supervision/Verbal cueing     Toileting Toileting    Toileting assist Assist for toileting: Set up assist     Transfers Chair/bed transfer  Transfers assist  Chair/bed transfer activity did  not occur: Safety/medical concerns (pain, fatigue, dizziness)  Chair/bed transfer assist level: Supervision/Verbal cueing     Locomotion Ambulation   Ambulation assist   Ambulation activity did not occur: Safety/medical concerns (pain, fatigue, dizziness)  Assist level: Supervision/Verbal cueing Assistive device: Rollator Max distance: 116ft   Walk 10 feet activity   Assist  Walk 10 feet activity did not occur: Safety/medical concerns (pain, fatigue, dizziness)  Assist level: Supervision/Verbal cueing Assistive device: Rollator   Walk 50 feet  activity   Assist Walk 50 feet with 2 turns activity did not occur: Safety/medical concerns (pain, fatigue, dizziness)  Assist level: Supervision/Verbal cueing Assistive device: Rollator    Walk 150 feet activity   Assist Walk 150 feet activity did not occur: Safety/medical concerns (fatigue)  Assist level: Supervision/Verbal cueing Assistive device: Rollator    Walk 10 feet on uneven surface  activity   Assist Walk 10 feet on uneven surfaces activity did not occur: Safety/medical concerns (pain, fatigue, dizziness)   Assist level: Contact Guard/Touching assist Assistive device: Rollator   Wheelchair     Assist Is the patient using a wheelchair?: Yes Type of Wheelchair: Manual Wheelchair activity did not occur: Safety/medical concerns (pain, fatigue, dizziness)         Wheelchair 50 feet with 2 turns activity    Assist    Wheelchair 50 feet with 2 turns activity did not occur: Safety/medical concerns (pain, fatigue, dizziness)       Wheelchair 150 feet activity     Assist  Wheelchair 150 feet activity did not occur: Safety/medical concerns (pain, fatigue, dizziness)       Blood pressure 132/72, pulse 78, temperature 98 F (36.7 C), resp. rate 18, height 5' 4 (1.626 m), weight 67 kg, SpO2 99%.  Medical Problem List and Plan: 1. Functional deficits secondary to debility after prolonged hospital stay related to mitral valve vegetations and MV repair and ultimate replacement.             -patient may  shower             -ELOS/Goals: 15 days, supervision goals with PT, OT, SLP  Continue CIR PT, OT and SLP Continue Eliquis  Full code but no chest compressions Discussed d/c plan with patient and team, will perform peer to peer with facility  2. Hypotension: medications reviewed and is on amiodarone , d/c clonazepam , continue compression garments, midodrine  decreased to 5mg  since hypotension has improved and patient is complaining of dry mouth,  continue abdominal binder, discussed with patient and son that this is likely to be worst post-dialysis  3. Pain: A) Bilateral heel pain: d/ced Lyrica  because this pain improves B) Right ankle pain: continue ASO, placed nursing for heat application in the morning for stiffness and ice in the afternoon to reduce inflammation. XR reviewed and is stable  4. Insomnia: d/c remeron , continue scheduled melatonin  5. Neuropsych/cognition: This patient is capable of making decisions on her own behalf.  6. Skin/Wound Care: Air mattress overlay for pressure relief.             --continue Ensure and prosource             --added Vitamin C  and Zinc to promote wound healing.   1/11 left heel appears to be evolution of what was present on admit   -continue above, prevalons   -discussed with pt importance of pressure relief, nutrition  Sarna and hydrocortisone  ordered for rash on chest  7. Decreased appetite: resolved, d/c megace , weaning mirtazepine  8.Fungal  endocarditis: continue oral fluconazole  indefinitely as per ID, discussed with patient and family  9. Mitral valve replacement with redo: has completed sternal precautions  10. AKI with dialysis initiated 02/16/24: On HD MWF at the end of the day to help with tolerance of therapy             Cr reviewed and is improving             --CLIP on hold. Needs to be able to sit up in a chair for HD.   -1/24 patient had hemodialysis today, reviewed nephrology note   11. A fib: Monitor HR TID--continue amiodarone  and eliquis .   12. Anemia of critical illness: Continue to monitor Hgb with HD labs   13.Enterobacter cloaca UTI: completed 3 day course of Bactrim   14. Daytime fatigue:  continue D3 daily Asked nephrology whether renal function panel can be discontinued so she does not need to be waken daily at 5am, daily weights also changed to 8am Discussed that this will likely continue to be worst following dialysis days B12 ordered (one time  injection given red dye allergy), d/c seroquel , change lyrica  to HS, decreased therapy to 15/7, d/c clonazepam  at night, discussed that remeron  could contribute but will maintain for now given insomnia and to help stimulate appetite, melatonin scheduled at night to help with insomnia 1/24-25  patient feels like new dialysis regimen has improved her fatigue  15. Hyponatremia: Na level reviewed and this improved, continue to monitor Na levels with HD  16. Bilateral hand tremors: liver enzymes reviewed and are wnl, discussed that these seem to worsen when patient is anxious  17. Nausea: improved    LOS: 21 days A FACE TO FACE EVALUATION WAS PERFORMED  Sven SQUIBB Hindy Perrault 03/31/2024, 9:32 AM     "

## 2024-03-31 NOTE — Progress Notes (Addendum)
 Patient ID: Tracy Baker, female   DOB: 18-Oct-1955, 69 y.o.   MRN: [id number]  Spoke with [name] regarding updates.   She is aware of the updated discharge date of Wednesday 01/28. She or either her brother Myer will provide transportation home.   Rob did file an appeal and they should receive an acceptance or denial by the end of the day or tomorrow.   Patient/family are awaiting updates for DME and dialysis. Abigail will provide an update for dialysis as soon as she gets an answer and therapy can update SW about DME as soon as possible.   [name] did inquire about PCA services. She was informed that it is an oop expense but a list can be provided for their use.   [name] was made aware that a [facility] agency will be established for follow-up therapies in the home.   Will provide updates as they come available.

## 2024-03-31 NOTE — Progress Notes (Signed)
 [facility] NEPHROLOGY PROGRESS NOTE  Assessment/ Plan: Pt is a 69 y.o. yo female   #Acute kidney Injury: She continues to anuric and without any evidence of renal recovery.  She has been on dialysis since 02/16/2024 and is status post left IJ TDC by IR on 12/22.   -TTS schedule, next HD tomorrow. She has been approved for hemodialysis at [facility] on TTS location and can start as early as Tuesday if discharged to skilled nursing facility.    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  Currently on fluconazole  with lifelong therapeutic duration noted.  # Hypotension: Blood pressures acceptable,  on midodrine  10 mg 3 times a day.  #  Anemia: monitor hb, check w/HD.  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  Subjective: Seen and examined at bedside.  Denies nausea, vomiting, chest pain, shortness of breath.  Plan for dialysis tomorrow. Objective Vital signs in last 24 hours: Vitals:   03/30/24 1416 03/30/24 2032 03/31/24 0629 03/31/24 0630  BP: 110/68 122/71 132/72   Pulse: 77 64 78   Resp: 19 17 18    Temp: 98.3 F (36.8 C) 97.7 F (36.5 C) 98 F (36.7 C)   TempSrc: Oral     SpO2: 99% 98% 99%   Weight:    67 kg  Height:       Weight change: -0.2 kg  Intake/Output Summary (Last 24 hours) at 03/31/2024 1116 Last data filed at 03/31/2024 0800 Gross per 24 hour  Intake 417 ml  Output --  Net 417 ml       Labs: RENAL PANEL Recent Labs  Lab 03/25/24 0518 03/26/24 1501  NA 135 136  K 4.4 4.8  CL 94* 95*  CO2 26 27  GLUCOSE 103* 116*  BUN 27* 56*  CREATININE 2.65* 4.82*  CALCIUM  9.7 9.6  PHOS 3.3 4.8*  ALBUMIN  4.0 3.8    Liver Function Tests: Recent Labs  Lab 03/25/24 0518 03/26/24 1501  ALBUMIN  4.0 3.8   No results for input(s): LIPASE, AMYLASE in the last 168 hours. No results for input(s): AMMONIA in the last 168 hours. CBC: Recent Labs    03/12/24 1530  03/13/24 1027 03/14/24 1224 03/17/24 0622 03/24/24 0539  HGB 10.3* 12.4 12.3 10.3* 9.7*  MCV 98.4 95.6 95.1 96.2 99.7    Cardiac Enzymes: No results for input(s): CKTOTAL, CKMB, CKMBINDEX, TROPONINI in the last 168 hours. CBG: Recent Labs  Lab 03/24/24 1828 03/25/24 0613  GLUCAP 93 93    Iron Studies: No results for input(s): IRON, TIBC, TRANSFERRIN, FERRITIN in the last 72 hours. Studies/Results: No results found.  Medications: Infusions:   Scheduled Medications:  amiodarone   200 mg Oral Daily   apixaban   5 mg Oral BID   ascorbic acid   500 mg Oral Q supper   cholecalciferol   1,000 Units Oral Daily   fluconazole   400 mg Oral Q supper   Followed by   NOREEN ON 05/05/2024] fluconazole   200 mg Oral Daily   Gerhardt's butt cream   Topical BID   hydrocerin   Topical BID   hydrocortisone  cream  1 Application Topical BID   melatonin  5 mg Oral QHS   midodrine   5 mg Oral TID WC   mouth rinse  15 mL Mouth Rinse 4 times per day   pantoprazole   40 mg Oral Daily   sertraline   25 mg Oral QHS    have reviewed scheduled and prn medications.  Physical Exam: General:NAD, comfortable Heart:RRR, s1s2 nl Lungs:clear b/l, no crackle Abdomen:soft, Non-tender, non-distended Extremities:No edema Dialysis Access: Right IJ TDC in place.  Cassy Sprowl Prasad Adore Kithcart 03/31/2024,11:16 AM  LOS: 21 days

## 2024-03-31 NOTE — Progress Notes (Addendum)
 Patient ID: Tracy Baker, female   DOB: Jan 04, 1956, 69 y.o.   MRN: [id number]  Family plans to participate in family education tomorrow between the times of 8am-11am, 9am-12pm, and 1pm-4pm. They were encouraged to ask any questions regarding patient care.

## 2024-03-31 NOTE — Progress Notes (Signed)
 Patient ID: Tracy Baker, female   DOB: 27-Jun-1955, 69 y.o.   MRN: [id number]  Spoke with Rob regarding all updates. He plans on filing an appeal with [employer] to potentially give them a few more planning days. He is also requesting that the team consider discharging his mother on Wednesday 01/28 rather than tomorrow, Tuesday 01/27 as they have to plan transportation as well as for his aunt to drive 20 hours down from [place]  to assist with patient care.   Since the patient will be returning back home to [place], SW provided an update to Abigail about getting a seat at [facility]. Will await update and plan for discharge accordingly.

## 2024-03-31 NOTE — Progress Notes (Signed)
 Patient ID: Tracy Baker, female   DOB: 1955/11/09, 69 y.o.   MRN: [id number]  Due to Tuesday, Thursday, Saturday dialysis days, discharge date may need be updated to Wednesday 01/28.   Awaiting, Peer to Peer results.

## 2024-03-31 NOTE — Progress Notes (Addendum)
 [facility] b core [facility] at this time. Awaiting financial and medical clearance. Will continue to assist and update at needed.   [facility] Nav [phone]  Contacted by social work. Unsure of dispo, will need to know whether to keep [facility] or [facility], awaiting feedback. Will assist when applicable.

## 2024-04-01 ENCOUNTER — Ambulatory Visit: Admitting: Physician Assistant

## 2024-04-01 DIAGNOSIS — R5381 Other malaise: Secondary | ICD-10-CM | POA: Diagnosis not present

## 2024-04-01 LAB — RENAL FUNCTION PANEL
Albumin: 3.8 g/dL (ref 3.5–5.0)
Anion gap: 15 (ref 5–15)
BUN: 57 mg/dL — ABNORMAL HIGH (ref 8–23)
CO2: 25 mmol/L (ref 22–32)
Calcium: 9.6 mg/dL (ref 8.9–10.3)
Chloride: 99 mmol/L (ref 98–111)
Creatinine, Ser: 3.67 mg/dL — ABNORMAL HIGH (ref 0.44–1.00)
GFR, Estimated: 13 mL/min — ABNORMAL LOW
Glucose, Bld: 95 mg/dL (ref 70–99)
Phosphorus: 4.5 mg/dL (ref 2.5–4.6)
Potassium: 3.6 mmol/L (ref 3.5–5.1)
Sodium: 139 mmol/L (ref 135–145)

## 2024-04-01 LAB — CBC
HCT: 28.4 % — ABNORMAL LOW (ref 36.0–46.0)
Hemoglobin: 9.4 g/dL — ABNORMAL LOW (ref 12.0–15.0)
MCH: 32.5 pg (ref 26.0–34.0)
MCHC: 33.1 g/dL (ref 30.0–36.0)
MCV: 98.3 fL (ref 80.0–100.0)
Platelets: 168 10*3/uL (ref 150–400)
RBC: 2.89 MIL/uL — ABNORMAL LOW (ref 3.87–5.11)
RDW: 16.3 % — ABNORMAL HIGH (ref 11.5–15.5)
WBC: 4.7 10*3/uL (ref 4.0–10.5)
nRBC: 0 % (ref 0.0–0.2)

## 2024-04-01 MED ORDER — ALTEPLASE 2 MG IJ SOLR: 2.0000 mg | Freq: Once | INTRAMUSCULAR | Status: DC | PRN

## 2024-04-01 MED ORDER — HEPARIN SODIUM (PORCINE) 1000 UNIT/ML IJ SOLN
INTRAMUSCULAR | Status: AC
  Filled 2024-04-01: qty 5

## 2024-04-01 MED ORDER — LIDOCAINE-PRILOCAINE 2.5-2.5 % EX CREA: 1.0000 | TOPICAL_CREAM | CUTANEOUS | Status: DC | PRN

## 2024-04-01 MED ORDER — HEPARIN SODIUM (PORCINE) 1000 UNIT/ML DIALYSIS: 20.0000 [IU]/kg | INTRAMUSCULAR | Status: DC | PRN

## 2024-04-01 MED ORDER — HEPARIN SODIUM (PORCINE) 1000 UNIT/ML DIALYSIS
1000.0000 [IU] | INTRAMUSCULAR | Status: DC | PRN
  Administered 2024-04-01: 4200 [IU]

## 2024-04-01 MED ORDER — LIDOCAINE HCL (PF) 1 % IJ SOLN: 5.0000 mL | INTRAMUSCULAR | Status: DC | PRN

## 2024-04-01 MED ORDER — ANTICOAGULANT SODIUM CITRATE 4% (200MG/5ML) IV SOLN: 5.0000 mL | Status: DC | PRN

## 2024-04-01 MED ORDER — PENTAFLUOROPROP-TETRAFLUOROETH EX AERO: 1.0000 | INHALATION_SPRAY | CUTANEOUS | Status: DC | PRN

## 2024-04-01 NOTE — Progress Notes (Signed)
 Pt. Came in alert and oriented. Consent verified and on file. Started with no complaints  UF removal: Tx duration: 3 hours  Access used: Left CVC Access issue: None  Tx completed and tolerated Catheter locked and dwelled with heparin  Endorsed to floor nurse Transported to room.  Verleen Stuckey Rubi Hetal Proano, RN Kidney Care Unit

## 2024-04-01 NOTE — Plan of Care (Signed)
" °  Problem: Consults Goal: RH GENERAL PATIENT EDUCATION Description: See Patient Education module for education specifics. Outcome: Progressing Goal: Skin Care Protocol Initiated - if Braden Score 18 or less Description: If consults are not indicated, leave blank or document N/A Outcome: Progressing Goal: Nutrition Consult-if indicated Outcome: Progressing Goal: Diabetes Guidelines if Diabetic/Glucose > 140 Description: If diabetic or lab glucose is > 140 mg/dl - Initiate Diabetes/Hyperglycemia Guidelines & Document Interventions  Outcome: Progressing   Problem: RH BOWEL ELIMINATION Goal: RH STG MANAGE BOWEL WITH ASSISTANCE Description: STG Manage Bowel with mod I  Assistance. Outcome: Progressing Goal: RH STG MANAGE BOWEL W/MEDICATION W/ASSISTANCE Description: STG Manage Bowel with Medication with mod I Assistance. Outcome: Progressing   "

## 2024-04-01 NOTE — Progress Notes (Signed)
 "                                                        PROGRESS NOTE   Subjective/Complaints: Patient states she will not have 24/7 supervision at home as daughter works and her sister sprained her ankle- messaged team to discuss whether an extended stay would allow us  to advance her goals to modI  ROS:  Pt denies fever, chills, SOB, abd pain, CP, N/V/C/D, and vision changes +fatigue-improved  denies dysuria   Objective:   No results found.  No results for input(s): WBC, HGB, HCT, PLT in the last 72 hours.   No results for input(s): NA, K, CL, CO2, GLUCOSE, BUN, CREATININE, CALCIUM  in the last 72 hours.   Intake/Output Summary (Last 24 hours) at 04/01/2024 1005 Last data filed at 03/31/2024 1813 Gross per 24 hour  Intake 480 ml  Output --  Net 480 ml       Wound 03/10/24 2051 Pressure Injury Sacrum Right Stage 2 -  Partial thickness loss of dermis presenting as a shallow open injury with a red, pink wound bed without slough. (Active)     Wound 03/15/24 1507 Pressure Injury Foot Left;Posterior Deep Tissue Pressure Injury - Purple or maroon localized area of discolored intact skin or blood-filled blister due to damage of underlying soft tissue from pressure and/or shear. (Active)    Physical Exam: Vital Signs Blood pressure 124/68, pulse 76, temperature 98.2 F (36.8 C), resp. rate 18, height 5' 4 (1.626 m), weight 67 kg, SpO2 97%.  General: awake, alert, appropriate, NAD, laying in bed appears comfortable; vitals reviewed HENT: conjugate gaze; oropharynx moist CV: RRR Pulmonary: CTA B/L; nonlabored GI: soft, NT, ND, (+)BS normoactive Extr: No LE edema. Psychiatric: appropriate, cooperative Skin: has chest wall incision, tunneled catheter on left chest wall, right buttock wound and bilateral heel wounds--> left heel now with quarter sized stage II--covered +rash on chest, erythematous-did not visualize today    Neuro: Alert and oriented  ,4-/5 strength throughout, good attention, sensation is intact, +bilateral hand tremors, stable 1/27    Assessment/Plan: 1. Functional deficits which require 3+ hours per day of interdisciplinary therapy in a comprehensive inpatient rehab setting. Physiatrist is providing close team supervision and 24 hour management of active medical problems listed below. Physiatrist and rehab team continue to assess barriers to discharge/monitor patient progress toward functional and medical goals  Care Tool:  Bathing    Body parts bathed by patient: Right arm, Left arm, Chest, Abdomen, Front perineal area, Buttocks, Right upper leg, Left upper leg, Right lower leg, Left lower leg, Face   Body parts bathed by helper: Buttocks, Right lower leg, Left lower leg     Bathing assist Assist Level: Supervision/Verbal cueing     Upper Body Dressing/Undressing Upper body dressing   What is the patient wearing?: Pull over shirt    Upper body assist Assist Level: Supervision/Verbal cueing    Lower Body Dressing/Undressing Lower body dressing      What is the patient wearing?: Underwear/pull up, Pants     Lower body assist Assist for lower body dressing: Supervision/Verbal cueing     Toileting Toileting    Toileting assist Assist for toileting: Set up assist     Transfers Chair/bed transfer  Transfers assist  Chair/bed transfer activity did not  occur: Safety/medical concerns (pain, fatigue, dizziness)  Chair/bed transfer assist level: Supervision/Verbal cueing     Locomotion Ambulation   Ambulation assist   Ambulation activity did not occur: Safety/medical concerns (pain, fatigue, dizziness)  Assist level: Supervision/Verbal cueing Assistive device: Rollator Max distance: 129ft   Walk 10 feet activity   Assist  Walk 10 feet activity did not occur: Safety/medical concerns (pain, fatigue, dizziness)  Assist level: Supervision/Verbal cueing Assistive device: Rollator   Walk  50 feet activity   Assist Walk 50 feet with 2 turns activity did not occur: Safety/medical concerns (pain, fatigue, dizziness)  Assist level: Supervision/Verbal cueing Assistive device: Rollator    Walk 150 feet activity   Assist Walk 150 feet activity did not occur: Safety/medical concerns (fatigue)  Assist level: Supervision/Verbal cueing Assistive device: Rollator    Walk 10 feet on uneven surface  activity   Assist Walk 10 feet on uneven surfaces activity did not occur: Safety/medical concerns (pain, fatigue, dizziness)   Assist level: Contact Guard/Touching assist Assistive device: Rollator   Wheelchair     Assist Is the patient using a wheelchair?: Yes Type of Wheelchair: Manual Wheelchair activity did not occur: Safety/medical concerns (pain, fatigue, dizziness)         Wheelchair 50 feet with 2 turns activity    Assist    Wheelchair 50 feet with 2 turns activity did not occur: Safety/medical concerns (pain, fatigue, dizziness)       Wheelchair 150 feet activity     Assist  Wheelchair 150 feet activity did not occur: Safety/medical concerns (pain, fatigue, dizziness)       Blood pressure 124/68, pulse 76, temperature 98.2 F (36.8 C), resp. rate 18, height 5' 4 (1.626 m), weight 67 kg, SpO2 97%.  Medical Problem List and Plan: 1. Functional deficits secondary to debility after prolonged hospital stay related to mitral valve vegetations and MV repair and ultimate replacement.             -patient may  shower             -ELOS/Goals: 15 days, supervision goals with PT, OT, SLP  Continue CIR PT, OT and SLP Continue Eliquis  Full code but no chest compressions Discussed d/c plan with patient and team, will perform peer to peer with facility  2. Hypotension: medications reviewed and is on amiodarone , d/c clonazepam , continue compression garments, midodrine  decreased to 5mg  since hypotension has improved and patient is complaining of dry  mouth, continue abdominal binder, discussed with patient and son that this is likely to be worst post-dialysis  3. Pain: A) Bilateral heel pain: d/ced Lyrica  because this pain improves B) Right ankle pain: continue ASO, placed nursing for heat application in the morning for stiffness and ice in the afternoon to reduce inflammation. XR reviewed and is stable  4. Insomnia: d/c remeron , continue scheduled melatonin  5. Neuropsych/cognition: This patient is capable of making decisions on her own behalf.  6. Skin/Wound Care: Air mattress overlay for pressure relief.             --continue Ensure and prosource             --added Vitamin C  and Zinc to promote wound healing.   1/11 left heel appears to be evolution of what was present on admit   -continue above, prevalons   -discussed with pt importance of pressure relief, nutrition  Sarna and hydrocortisone  ordered for rash on chest  7. Decreased appetite: resolved, d/c megace , d/c remeron   8.Fungal endocarditis:  continue oral fluconazole  indefinitely as per ID, discussed with patient and family  9. Mitral valve replacement with redo: has completed sternal precautions  10. AKI with dialysis initiated 02/16/24: On HD MWF at the end of the day to help with tolerance of therapy             Cr reviewed and is improving             --CLIP on hold. Needs to be able to sit up in a chair for HD.    11. A fib: Monitor HR TID--continue amiodarone  and eliquis .   12. Anemia of critical illness: Continue to monitor Hgb with HD labs   13.Enterobacter cloaca UTI: completed 3 day course of Bactrim   14. Daytime fatigue:  continue D3 daily Asked nephrology whether renal function panel can be discontinued so she does not need to be waken daily at 5am, daily weights also changed to 8am Discussed that this will likely continue to be worst following dialysis days B12 ordered (one time injection given red dye allergy), d/c seroquel , change lyrica  to HS,  decreased therapy to 15/7, d/c clonazepam  at night, discussed that remeron  could contribute but will maintain for now given insomnia and to help stimulate appetite, melatonin scheduled at night to help with insomnia  15. Hyponatremia: Na level reviewed and this improved, continue to monitor Na levels with HD  16. Bilateral hand tremors: liver enzymes reviewed and are wnl, discussed that these seem to worsen when patient is anxious  17. Nausea: improved  18. Anxiety: psychology consult placed as per daughter's request    LOS: 22 days A FACE TO FACE EVALUATION WAS PERFORMED  Tristine Langi P Saed Hudlow 04/01/2024, 10:05 AM     "

## 2024-04-01 NOTE — Progress Notes (Signed)
 DME order submitted via [employer]

## 2024-04-01 NOTE — Progress Notes (Incomplete Revision)
 DME order submitted via [employer]

## 2024-04-01 NOTE — Progress Notes (Signed)
" °   04/01/24 1455  Vitals  Temp 97.8 F (36.6 C)  Pulse Rate (!) 107  Resp (!) 22  BP 109/66  SpO2 97 %  O2 Device Room Air  Weight 67.6 kg  Type of Weight Post-Dialysis  Oxygen Therapy  Patient Activity (if Appropriate) In bed  Pulse Oximetry Type Continuous  Oximetry Probe Site Changed No  Post Treatment  Dialyzer Clearance Lightly streaked  Liters Processed 72  Fluid Removed (mL) 1500 mL  Tolerated HD Treatment Yes  Post-Hemodialysis Comments pt. tolerated tx well    "

## 2024-04-01 NOTE — Progress Notes (Signed)
 Occupational Therapy Session Note  Patient Details  Name: Tracy Baker MRN: [id number] Date of Birth: 10-Feb-1956  Today's Date: 04/01/2024 OT Individual Time: 8962-8897 OT Individual Time Calculation (min): 25 min    Short Term Goals: Week 3:  OT Short Term Goal 1 (Week 3): STG = LTG (Continue progressing towards LTGs)  Skilled Therapeutic Interventions/Progress Updates:  Skilled OT intervention completed with focus on DC planning, functional  mobility. Pt received upright in bed, agreeable to session. No pain reported.  Pt requested to void. Transitioned EOB mod I. Able to complete all sit > stands and ambulatory transfers using rollator with supervision during session with minimal cueing and no LOB. Ambulated > toilet. Continent of urinary void. Distant supervision for toileting steps. Ambulated > sink for hand hygiene > EOB.   Remainder of session spent discussing pt's DC plan. Pt reports she has personal desire to DC home and feels she can complete BADLs and transfers without assist, but is unsure of dialysis transportation and ability to do stairs etc which she would need assist for. Communicated to pt that her CLOF is supervision for all BADLs and that pt has made great progress with goals met and should be proud of where she is functionally. We discussed ways to modify at home for safety, energy conservation and BSC for toileting. CSW made aware. OT communicated to care team via secure chat about current status and pt concerns.  Pt remained upright in bed, with bed alarm on/activated, and with all needs in reach at end of session.   Therapy Documentation Precautions:  Precautions Precautions: Fall Recall of Precautions/Restrictions: Impaired Precaution/Restrictions Comments: sternal precautions lifted 1/19 Restrictions Weight Bearing Restrictions Per Provider Order: No     Therapy/Group: Individual Therapy  [name], MS, OTR/L  04/01/2024, 12:19 PM

## 2024-04-01 NOTE — Progress Notes (Addendum)
 Pt approved a TTS 1230 chair at [facility]. Can start Thursday 1/29. Arrive at 1145am first day. Confirmed with clinic. Informed social work at this time, will update AVS and request orders be sent.   Tracy [facility] Nav [phone]  Addendum 4pm AVS updated, renal PA informed to send orders when possible, will continue to assist as needed.

## 2024-04-01 NOTE — Progress Notes (Addendum)
 Contacted patient's son Rob per [name]'s request. They report that after filing the insurance appeal yesterday the insurance company told them there was nothing else to be done and. They were informed that a PCP needed to file an appeal. Rob reports he spent 40 minutes on the phone with them and they never received a confirmation number now they feel the day was wasted.   Rob and [name] both expressed frustration about feeling as though they were not given enough time to make a decision and that they are being kicked out due to the short notice of things on the insurances end.   SW provided active listening and support. They were informed that the patient is at supervision/mod-I level and is walking more than 400 feet around the unit which is why insurance denied SNF placement.  Per the conversation yesterday, SW asked about the oop expense for the SNF to which Rob responded that it would be about $365 a day and $10k-$15k a month which they cannot afford.   SW did inform them that the patient will receive follow-up HH therapy after she discharges but, at the current moment with her supervision/mod-I goals, she can be set up at home with items in reach and assistance as needed. She will also get appropriate DME via [employer].   HH to be established for PT/OT - referrals sent and will update discharge ASAP.

## 2024-04-01 NOTE — Progress Notes (Signed)
 [facility] NEPHROLOGY PROGRESS NOTE  Assessment/ Plan: Pt is a 69 y.o. yo female   #Acute kidney Injury: She continues to anuric and without any evidence of renal recovery.  She has been on dialysis since 02/16/2024 and is status post left IJ TDC by IR on 12/22.   -SW/renal navigator is following to arrange outpatient dialysis. -TTS schedule, HD today. .    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  Currently on fluconazole  with lifelong therapeutic duration noted.  # Hypotension: Blood pressures acceptable,  on midodrine  10 mg 3 times a day.  #  Anemia: monitor hb, check w/HD.  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  Subjective: Seen and examined at bedside.  Denies nausea, vomiting, chest pain, shortness of breath.  Plan for dialysis today. Objective Vital signs in last 24 hours: Vitals:   03/31/24 1412 03/31/24 2007 04/01/24 0620 04/01/24 0621  BP: (!) 116/58 138/61 124/68   Pulse: 65 62 76   Resp: 18 17 18    Temp: 98.5 F (36.9 C) 98.1 F (36.7 C) 98.2 F (36.8 C)   TempSrc:      SpO2: 96% 98% 97%   Weight:    67 kg  Height:       Weight change: 0 kg  Intake/Output Summary (Last 24 hours) at 04/01/2024 1024 Last data filed at 03/31/2024 1813 Gross per 24 hour  Intake 480 ml  Output --  Net 480 ml       Labs: RENAL PANEL Recent Labs  Lab 03/26/24 1501  NA 136  K 4.8  CL 95*  CO2 27  GLUCOSE 116*  BUN 56*  CREATININE 4.82*  CALCIUM  9.6  PHOS 4.8*  ALBUMIN  3.8    Liver Function Tests: Recent Labs  Lab 03/26/24 1501  ALBUMIN  3.8   No results for input(s): LIPASE, AMYLASE in the last 168 hours. No results for input(s): AMMONIA in the last 168 hours. CBC: Recent Labs    03/12/24 1530 03/13/24 1027 03/14/24 1224 03/17/24 0622 03/24/24 0539  HGB 10.3* 12.4 12.3 10.3* 9.7*  MCV 98.4 95.6 95.1 96.2 99.7    Cardiac Enzymes: No results for input(s): CKTOTAL, CKMB,  CKMBINDEX, TROPONINI in the last 168 hours. CBG: No results for input(s): GLUCAP in the last 168 hours.   Iron Studies: No results for input(s): IRON, TIBC, TRANSFERRIN, FERRITIN in the last 72 hours. Studies/Results: No results found.  Medications: Infusions:   Scheduled Medications:  amiodarone   200 mg Oral Daily   apixaban   5 mg Oral BID   ascorbic acid   500 mg Oral Q supper   cholecalciferol   1,000 Units Oral Daily   fluconazole   400 mg Oral Q supper   Followed by   NOREEN ON 05/05/2024] fluconazole   200 mg Oral Daily   Gerhardt's butt cream   Topical BID   hydrocerin   Topical BID   hydrocortisone  cream  1 Application Topical BID   melatonin  5 mg Oral QHS   midodrine   5 mg Oral TID WC   mouth rinse  15 mL Mouth Rinse 4 times per day   pantoprazole   40 mg Oral Daily   sertraline   25 mg Oral QHS    have reviewed scheduled and prn medications.  Physical Exam: General:NAD, comfortable Heart:RRR, s1s2 nl Lungs:clear b/l, no crackle Abdomen:soft, Non-tender, non-distended Extremities:No edema Dialysis Access: Right IJ TDC in place.  Justene Jensen Prasad Tashima Scarpulla 04/01/2024,10:24 AM  LOS: 22 days

## 2024-04-01 NOTE — Progress Notes (Addendum)
 Physical Therapy Session Note  Patient Details  Name: Tracy Baker MRN: [id number] Date of Birth: Oct 03, 1955  Today's Date: 04/01/2024 PT Missed Time: 45 Minutes Missed Time Reason: Unavailable   Short Term Goals: Week 2:  PT Short Term Goal 1 (Week 2): STG=LTG due to LOS Week 3:  PT Short Term Goal 1 (Week 3): STG=LTG due to entended LOS  Skilled Therapeutic Interventions/Progress Updates:   Attempted to see pt for scheduled PT session, however pt off unit for dialysis. Will attempt to make up missed time as pt becomes available. 45 minutes missed of skilled physical therapy.   Therapy Documentation Precautions:  Precautions Precautions: Fall Recall of Precautions/Restrictions: Impaired Precaution/Restrictions Comments: sternal precautions lifted 1/19 Restrictions Weight Bearing Restrictions Per Provider Order: No Other Position/Activity Restrictions: Cardiac sternal precautions  Therapy/Group: Individual Therapy [name] [name] PT, DPT 04/01/2024, 6:47 AM

## 2024-04-01 NOTE — Consult Note (Addendum)
 [facility] Psychiatric Consult Initial  Patient Name: .Tracy Baker  MRN: [id number]  DOB: April 20, 1955  Consult Order details:  Orders (From admission, onward)     Start     Ordered   03/31/24 1414  IP CONSULT TO PSYCHIATRY       Comments: Prolonged hospitalization and family concerned about patient's overall mood.  Ordering Provider: Maurice Sharlet RAMAN, PA-C  Provider:  (Not yet assigned)  Question Answer Comment  Location [facility]   Reason for Consult? Depression.      03/31/24 1414             Mode of Visit: In person    Psychiatry Consult Evaluation  Service Date: April 01, 2024 LOS:  LOS: 22 days  Chief Complaint Depression  Primary Psychiatric Diagnoses  Depression 2.  Anxiety  Assessment  [name] is a 69 y.o. female admitted: Medicallyfor 03/10/2024  8:51 PM for Debility. She carries the psychiatric diagnoses of Depression and Anxiety.   Her current presentation of anxiety is most consistent with adjustment disorder. Current outpatient psychotropic medications include Zoloft  25 mg PO daily and historically she has had a modest response to these medications. She was compliant with medications prior to admission as evidenced by patient report. On initial examination, patient was seen laying in bed on my approach this morning. She reports that she has been in and out of the hospital for months due to a kidney stone, a UTI, and Heart issues. She believes that psychiatry was consulted because her daughter may have reported that the patient is experiencing high anxiety. She admits that she has been more anxious and frustrated due to being in the hospital but she is motivated to get better and looking forward to returning home. She does admit to some concern about the weather, ice, and how this may impact her ability to move around. We discussed her mental health treatment and the patient reported interest in resources for outpatient behavioral health  treatment. She denies any SI/HI/AVH. Please see plan below for detailed recommendations.   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   Right ankle pain    Plan   ## Psychiatric Medication Recommendations:  -Continue Zoloft  25 mg PO daily  ## Medical Decision Making Capacity: Not specifically addressed in this encounter  ## Further Work-up:  -- Per Primary Team -- Pertinent labwork reviewed earlier this admission includes:  Recent Results (from the past 2160 hours)  Renal function panel     Status: Abnormal   Collection Time: 01/03/24  3:42 AM  Result Value Ref Range   Sodium 139 135 - 145 mmol/L   Potassium 4.0 3.5 - 5.1 mmol/L   Chloride 103 98 - 111 mmol/L   CO2 22 22 - 32 mmol/L   Glucose, Bld 109 (H) 70 - 99 mg/dL    Comment: Glucose reference range applies only to samples taken after fasting for at least 8 hours.   BUN 13 8 - 23 mg/dL   Creatinine, Ser 9.28 0.44 - 1.00 mg/dL   Calcium  8.7 (L) 8.9 - 10.3 mg/dL   Phosphorus 3.6 2.5 - 4.6 mg/dL   Albumin  2.3 (L) 3.5 - 5.0 g/dL   GFR, Estimated >39 >39 mL/min    Comment: (NOTE) Calculated using the CKD-EPI Creatinine Equation (  2021)    Anion gap 14 5 - 15    Comment: Performed at [facility], [address], [place], [place] [address]  Magnesium      Status: None   Collection Time: 01/03/24  3:42 AM  Result Value Ref Range   Magnesium  1.8 1.7 - 2.4 mg/dL    Comment: Performed at [facility], [address], [place], [place] [address]  CBC     Status: Abnormal   Collection Time: 01/03/24  3:42 AM  Result Value Ref Range   WBC 5.5 4.0 - 10.5 K/uL   RBC 3.54 (L) 3.87 - 5.11 MIL/uL   Hemoglobin 11.3 (L) 12.0 - 15.0 g/dL   HCT 66.5 (L) 63.9 - 53.9 %   MCV 94.4 80.0 - 100.0 fL   MCH 31.9 26.0 - 34.0 pg   MCHC  33.8 30.0 - 36.0 g/dL   RDW 87.9 88.4 - 84.4 %   Platelets 309 150 - 400 K/uL   nRBC 0.0 0.0 - 0.2 %    Comment: Performed at [facility], [address] [place], [place] [address]  Renal function panel     Status: Abnormal   Collection Time: 01/04/24  3:54 AM  Result Value Ref Range   Sodium 139 135 - 145 mmol/L   Potassium 3.9 3.5 - 5.1 mmol/L   Chloride 103 98 - 111 mmol/L   CO2 23 22 - 32 mmol/L   Glucose, Bld 127 (H) 70 - 99 mg/dL    Comment: Glucose reference range applies only to samples taken after fasting for at least 8 hours.   BUN 18 8 - 23 mg/dL   Creatinine, Ser 9.36 0.44 - 1.00 mg/dL   Calcium  9.0 8.9 - 10.3 mg/dL   Phosphorus 3.9 2.5 - 4.6 mg/dL   Albumin  2.4 (L) 3.5 - 5.0 g/dL   GFR, Estimated >39 >39 mL/min    Comment: (NOTE) Calculated using the CKD-EPI Creatinine Equation (2021)    Anion gap 13 5 - 15    Comment: Performed at [facility], [address] [place], [place] [address]  Magnesium      Status: None   Collection Time: 01/04/24  3:54 AM  Result Value Ref Range   Magnesium  1.8 1.7 - 2.4 mg/dL    Comment: Performed at [facility], [address], [place], [place] [address]  CBC     Status: Abnormal   Collection Time: 01/04/24  3:54 AM  Result Value Ref Range   WBC 5.8 4.0 - 10.5 K/uL   RBC 3.53 (L) 3.87 - 5.11 MIL/uL   Hemoglobin 11.2 (L) 12.0 - 15.0 g/dL   HCT 66.5 (L) 63.9 - 53.9 %   MCV 94.6 80.0 - 100.0 fL   MCH 31.7 26.0 - 34.0 pg   MCHC 33.5 30.0 - 36.0 g/dL   RDW 87.9 88.4 - 84.4 %   Platelets 310 150 - 400 K/uL   nRBC 0.0 0.0 - 0.2 %    Comment: Performed at [facility], [address], [place], [place] [address]  I-STAT 7, (LYTES, BLD GAS, ICA, H+H)     Status: Abnormal   Collection Time: 01/04/24  2:26 PM  Result Value Ref Range   pH, Arterial 7.440 7.35 - 7.45   pCO2 arterial 34.9 32 - 48 mmHg   pO2, Arterial 85 83 - 108 mmHg   Bicarbonate 23.7 20.0 - 28.0 mmol/L   TCO2 25 22 -  32 mmol/L   O2  Saturation 97 %   Acid-Base Excess 0.0 0.0 - 2.0 mmol/L   Sodium 143 135 - 145 mmol/L   Potassium 4.0 3.5 - 5.1 mmol/L   Calcium , Ion 1.12 (L) 1.15 - 1.40 mmol/L   HCT 30.0 (L) 36.0 - 46.0 %   Hemoglobin 10.2 (L) 12.0 - 15.0 g/dL   Collection site art line    Drawn by Nurse    Sample type ARTERIAL   POCT I-Stat EG7     Status: Abnormal   Collection Time: 01/04/24  2:31 PM  Result Value Ref Range   pH, Ven 7.423 7.25 - 7.43   pCO2, Ven 42.4 (L) 44 - 60 mmHg   pO2, Ven 34 32 - 45 mmHg   Bicarbonate 27.7 20.0 - 28.0 mmol/L   TCO2 29 22 - 32 mmol/L   O2 Saturation 67 %   Acid-Base Excess 3.0 (H) 0.0 - 2.0 mmol/L   Sodium 141 135 - 145 mmol/L   Potassium 4.2 3.5 - 5.1 mmol/L   Calcium , Ion 1.20 1.15 - 1.40 mmol/L   HCT 32.0 (L) 36.0 - 46.0 %   Hemoglobin 10.9 (L) 12.0 - 15.0 g/dL   Sample type VENOUS    Comment NOTIFIED PHYSICIAN   I-STAT 7, (LYTES, BLD GAS, ICA, H+H)     Status: Abnormal   Collection Time: 01/04/24  2:32 PM  Result Value Ref Range   pH, Arterial 7.407 7.35 - 7.45   pCO2 arterial 43.0 32 - 48 mmHg   pO2, Arterial 36 (LL) 83 - 108 mmHg   Bicarbonate 27.0 20.0 - 28.0 mmol/L   TCO2 28 22 - 32 mmol/L   O2 Saturation 68 %   Acid-Base Excess 2.0 0.0 - 2.0 mmol/L   Sodium 140 135 - 145 mmol/L   Potassium 4.3 3.5 - 5.1 mmol/L   Calcium , Ion 1.26 1.15 - 1.40 mmol/L   HCT 32.0 (L) 36.0 - 46.0 %   Hemoglobin 10.9 (L) 12.0 - 15.0 g/dL   Sample type ARTERIAL    Comment NOTIFIED PHYSICIAN   Basic metabolic panel     Status: None   Collection Time: 01/05/24  4:17 AM  Result Value Ref Range   Sodium 137 135 - 145 mmol/L   Potassium 4.2 3.5 - 5.1 mmol/L   Chloride 101 98 - 111 mmol/L   CO2 23 22 - 32 mmol/L   Glucose, Bld 99 70 - 99 mg/dL    Comment: Glucose reference range applies only to samples taken after fasting for at least 8 hours.   BUN 21 8 - 23 mg/dL   Creatinine, Ser 9.34 0.44 - 1.00 mg/dL   Calcium  9.0 8.9 - 10.3 mg/dL   GFR, Estimated >39 >39 mL/min     Comment: (NOTE) Calculated using the CKD-EPI Creatinine Equation (2021)    Anion gap 13 5 - 15    Comment: Performed at [facility], [address], [place], [place] [address]  Surgical PCR screen     Status: None   Collection Time: 01/05/24 12:22 PM   Specimen: Nasal Mucosa; Nasal Swab  Result Value Ref Range   MRSA, PCR NEGATIVE NEGATIVE   Staphylococcus aureus NEGATIVE NEGATIVE    Comment: (NOTE) The Xpert SA Assay (FDA approved for NASAL specimens in patients 51 years of age and older), is one component of a comprehensive surveillance program. It is not intended to diagnose infection nor to guide or monitor treatment. Performed at [facility], [address] [place], [place] [address]   Renal function panel     Status: Abnormal   Collection Time: 01/08/24  4:27 AM  Result Value Ref Range   Sodium 138 135 - 145 mmol/L   Potassium 4.1 3.5 - 5.1 mmol/L   Chloride 104 98 - 111 mmol/L   CO2 19 (L) 22 - 32 mmol/L   Glucose, Bld 108 (H) 70 - 99 mg/dL    Comment: Glucose reference range applies only to samples taken after fasting for at least 8 hours.   BUN 23 8 - 23 mg/dL   Creatinine, Ser 9.34 0.44 - 1.00 mg/dL   Calcium  9.3 8.9 - 10.3 mg/dL   Phosphorus 3.5 2.5 - 4.6 mg/dL   Albumin  2.8 (L) 3.5 - 5.0 g/dL   GFR, Estimated >39 >39 mL/min    Comment: (NOTE) Calculated using the CKD-EPI Creatinine Equation (2021)    Anion gap 15 5 - 15    Comment: Performed at [facility], [address], [place], [place] [address]  Magnesium      Status: None   Collection Time: 01/08/24  4:27 AM  Result Value Ref Range   Magnesium  1.9 1.7 - 2.4 mg/dL    Comment: Performed at [facility], [address], [place], [place] [address]  CBC     Status: None   Collection Time: 01/08/24  4:27 AM  Result Value Ref Range   WBC 7.5 4.0 - 10.5 K/uL   RBC 4.00 3.87 - 5.11 MIL/uL   Hemoglobin 12.7 12.0 - 15.0 g/dL   HCT 62.2 63.9 - 53.9 %   MCV 94.3 80.0 - 100.0 fL   MCH  31.8 26.0 - 34.0 pg   MCHC 33.7 30.0 - 36.0 g/dL   RDW 88.2 88.4 - 84.4 %   Platelets 278 150 - 400 K/uL   nRBC 0.0 0.0 - 0.2 %    Comment: Performed at [facility], [address] [place], [place] [address]  ABO/Rh     Status: None   Collection Time: 01/08/24  4:27 AM  Result Value Ref Range   ABO/RH(D)      A POS Performed at [facility], [address], [place], [place] [address]   Type and screen [facility]     Status: None   Collection Time: 01/09/24  5:20 AM  Result Value Ref Range   ABO/RH(D) A POS    Antibody Screen NEG    Sample Expiration 01/12/2024,2359    Unit Number [id number]    Blood Component Type RED CELLS,LR    Unit division 00    Status of Unit ISSUED,FINAL    Transfusion Status OK TO TRANSFUSE    Crossmatch Result Compatible    Unit Number [id number]    Blood Component Type RED CELLS,LR    Unit division 00    Status of Unit ISSUED,FINAL    Transfusion Status OK TO TRANSFUSE    Crossmatch Result Compatible    Unit Number [id number]    Blood Component Type RED CELLS,LR    Unit division 00    Status of Unit REL FROM [facility]    Transfusion Status OK TO TRANSFUSE    Crossmatch Result      Compatible Performed at [facility], [address], [place], [place] [address]    Unit Number [id number]    Blood Component Type RED CELLS,LR    Unit division 00  Status of Unit REL FROM [facility]    Transfusion Status OK TO TRANSFUSE    Crossmatch Result Compatible    Unit Number [id number]    Blood Component Type RED CELLS,LR    Unit division 00    Status of Unit ISSUED,FINAL    Transfusion Status OK TO TRANSFUSE    Crossmatch Result Compatible    Unit Number [id number]    Blood Component Type RED CELLS,LR    Unit division 00    Status of Unit ISSUED,FINAL    Transfusion Status OK TO TRANSFUSE    Crossmatch Result Compatible    Unit Number [id number]    Blood Component Type RED CELLS,LR    Unit  division 00    Status of Unit REL FROM [facility]    Transfusion Status OK TO TRANSFUSE    Crossmatch Result Compatible    Unit Number [id number]    Blood Component Type RED CELLS,LR    Unit division 00    Status of Unit REL FROM [facility]    Transfusion Status OK TO TRANSFUSE    Crossmatch Result Compatible   BPAM RBC     Status: None   Collection Time: 01/09/24  5:20 AM  Result Value Ref Range   ISSUE DATE / TIME 797488939084    Blood Product Unit Number [id number]    PRODUCT CODE [id number]    Unit Type and Rh 6200    Blood Product Expiration Date 797488867640    ISSUE DATE / TIME 797488939084    Blood Product Unit Number [id number]    PRODUCT CODE [id number]    Unit Type and Rh 6200    Blood Product Expiration Date 202511202359    ISSUE DATE / TIME 797488938641    Blood Product Unit Number [id number]    PRODUCT CODE [id number]    Unit Type and Rh 6200    Blood Product Expiration Date 797488757640    ISSUE DATE / TIME 797488938641    Blood Product Unit Number [id number]    PRODUCT CODE [id number]    Unit Type and Rh 6200    Blood Product Expiration Date 797488757640    ISSUE DATE / TIME 797488939056    Blood Product Unit Number [id number]    PRODUCT CODE E0382V00    Unit Type and Rh 6200    Blood Product Expiration Date 797488757640    ISSUE DATE / TIME 797488939056    Blood Product Unit Number [id number]    PRODUCT CODE E0382V00    Unit Type and Rh 6200    Blood Product Expiration Date 202511252359    Blood Product Unit Number [id number]    PRODUCT CODE E0382V00    Unit Type and Rh 6200    Blood Product Expiration Date 797488717640    Blood Product Unit Number [id number]    PRODUCT CODE E0382V00    Unit Type and Rh 6200    Blood Product Expiration Date 797488717640   CBC     Status: None   Collection Time: 01/09/24  5:23 AM  Result Value Ref Range   WBC 5.7 4.0 - 10.5 K/uL   RBC 3.91 3.87 - 5.11 MIL/uL   Hemoglobin 12.4 12.0 - 15.0 g/dL   HCT  63.7 63.9 - 53.9 %   MCV 92.6 80.0 - 100.0 fL   MCH 31.7 26.0 - 34.0 pg   MCHC 34.3 30.0 - 36.0 g/dL   RDW 88.0 88.4 - 84.4 %   Platelets 261 150 - 400 K/uL   nRBC 0.0 0.0 -  0.2 %    Comment: Performed at [facility], [address] [place], [place] [address]  Basic metabolic panel     Status: Abnormal   Collection Time: 01/09/24  5:23 AM  Result Value Ref Range   Sodium 140 135 - 145 mmol/L   Potassium 3.9 3.5 - 5.1 mmol/L   Chloride 104 98 - 111 mmol/L   CO2 25 22 - 32 mmol/L   Glucose, Bld 110 (H) 70 - 99 mg/dL    Comment: Glucose reference range applies only to samples taken after fasting for at least 8 hours.   BUN 22 8 - 23 mg/dL   Creatinine, Ser 9.32 0.44 - 1.00 mg/dL   Calcium  9.0 8.9 - 10.3 mg/dL   GFR, Estimated >39 >39 mL/min    Comment: (NOTE) Calculated using the CKD-EPI Creatinine Equation (2021)    Anion gap 11 5 - 15    Comment: Performed at [facility], [address] [place], [place] [address]  I-STAT, nathanael 8     Status: Abnormal   Collection Time: 01/10/24  8:25 AM  Result Value Ref Range   Sodium 141 135 - 145 mmol/L   Potassium 3.8 3.5 - 5.1 mmol/L   Chloride 102 98 - 111 mmol/L   BUN 19 8 - 23 mg/dL   Creatinine, Ser 9.49 0.44 - 1.00 mg/dL   Glucose, Bld 852 (H) 70 - 99 mg/dL    Comment: Glucose reference range applies only to samples taken after fasting for at least 8 hours.   Calcium , Ion 1.25 1.15 - 1.40 mmol/L   TCO2 28 22 - 32 mmol/L   Hemoglobin 10.5 (L) 12.0 - 15.0 g/dL   HCT 68.9 (L) 63.9 - 53.9 %  ECHO INTRAOPERATIVE TEE     Status: None   Collection Time: 01/10/24  8:32 AM  Result Value Ref Range   Weight 2,345.69 oz   Height 64 in   BP 171/77 mmHg  Prepare RBC (crossmatch)     Status: None   Collection Time: 01/10/24  8:57 AM  Result Value Ref Range   Order Confirmation      ORDER PROCESSED BY BLOOD BANK Performed at [facility], [address] [place], [place] [address]   I-STAT, nathanael 8     Status:  Abnormal   Collection Time: 01/10/24  9:05 AM  Result Value Ref Range   Sodium 140 135 - 145 mmol/L   Potassium 3.4 (L) 3.5 - 5.1 mmol/L   Chloride 104 98 - 111 mmol/L   BUN 19 8 - 23 mg/dL   Creatinine, Ser 9.49 0.44 - 1.00 mg/dL   Glucose, Bld 835 (H) 70 - 99 mg/dL    Comment: Glucose reference range applies only to samples taken after fasting for at least 8 hours.   Calcium , Ion 1.14 (L) 1.15 - 1.40 mmol/L   TCO2 25 22 - 32 mmol/L   Hemoglobin 9.2 (L) 12.0 - 15.0 g/dL   HCT 72.9 (L) 63.9 - 53.9 %  I-STAT 7, (LYTES, BLD GAS, ICA, H+H)     Status: Abnormal   Collection Time: 01/10/24  9:28 AM  Result Value Ref Range   pH, Arterial 7.555 (H) 7.35 - 7.45   pCO2 arterial 28.1 (L) 32 - 48 mmHg   pO2, Arterial 343 (H) 83 - 108 mmHg   Bicarbonate 24.9 20.0 - 28.0 mmol/L   TCO2 26 22 - 32 mmol/L   O2 Saturation 100 %  Acid-Base Excess 2.0 0.0 - 2.0 mmol/L   Sodium 141 135 - 145 mmol/L   Potassium 3.7 3.5 - 5.1 mmol/L   Calcium , Ion 0.94 (L) 1.15 - 1.40 mmol/L   HCT 20.0 (L) 36.0 - 46.0 %   Hemoglobin 6.8 (LL) 12.0 - 15.0 g/dL   Sample type ARTERIAL   POCT I-Stat EG7     Status: Abnormal   Collection Time: 01/10/24  9:32 AM  Result Value Ref Range   pH, Ven 7.514 (H) 7.25 - 7.43   pCO2, Ven 31.4 (L) 44 - 60 mmHg   pO2, Ven 42 32 - 45 mmHg   Bicarbonate 25.3 20.0 - 28.0 mmol/L   TCO2 26 22 - 32 mmol/L   O2 Saturation 83 %   Acid-Base Excess 2.0 0.0 - 2.0 mmol/L   Sodium 142 135 - 145 mmol/L   Potassium 3.6 3.5 - 5.1 mmol/L   Calcium , Ion 0.98 (L) 1.15 - 1.40 mmol/L   HCT 22.0 (L) 36.0 - 46.0 %   Hemoglobin 7.5 (L) 12.0 - 15.0 g/dL   Sample type VENOUS   Surgical pathology     Status: None   Collection Time: 01/10/24  9:56 AM  Result Value Ref Range   SURGICAL PATHOLOGY      SURGICAL PATHOLOGY CASE: [id number] PATIENT: Cyera Gehling Surgical Pathology Report     Clinical History: mitral valve endocarditis (cm)     FINAL MICROSCOPIC DIAGNOSIS:  A. 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  Sectioning reveals underlying tan cut surfaces.  The specimen is entirely submitted in 1 block. MARYSUE, 01/11/2024)  Final Diagnosis performed by Katrine Muskrat, MD.   Electronically signed 01/15/2024 Technical component performed at [facility], [address], [place], [place] [address].  Professional component performed at [facility], [address] [place], [place] [address].  Immunohistochemistry Technical component (if applicable) was performed at [facility]. [address], [place], [place] [address].   IM MUNOHISTOCHEMISTRY DISCLAIMER (if applicable): Some of these immunohistochemical stains may have been developed and the performance characteristics determine by [facility]. Some may not have been cleared or approved by the U.S. Food and Drug Administration. The FDA has determined that such clearance or approval is not necessary. This test is used for clinical purposes. It should not be regarded as investigational or for research. This laboratory is certified under the Clinical Laboratory Improvement Amendments of 1988 (CLIA-88) as qualified to perform high complexity clinical laboratory testing.  The controls stained appropriately.   IHC stains are performed on formalin fixed, paraffin embedded tissue using a 3,3diaminobenzidine (DAB) chromogen and Leica Bond Autostainer System. The staining intensity of the nucleus is score manually and is reported as the percentage of tumor cell nuclei demonstrating specific nuclear staining. The specimens are fixed in 10 % Neutral Formalin for at least 6 hours and up to 72hrs. These tests are validated on decalcified tissue. Results should be interpreted with  caution given the possibility of false negative results on decalcified specimens. Antibody Clones are as follows ER-clone 22F, PR-clone 16, Ki67- clone MM1. Some of these immunohistochemical stains may have been developed and the performance characteristics determined by [facility].   I-STAT, chem 8     Status: Abnormal   Collection Time: 01/10/24  9:58 AM  Result  Value Ref Range   Sodium 136 135 - 145 mmol/L   Potassium 5.1 3.5 - 5.1 mmol/L   Chloride 102 98 - 111 mmol/L   BUN 17 8 - 23 mg/dL   Creatinine, Ser 9.59 (L) 0.44 - 1.00 mg/dL   Glucose, Bld 827 (H) 70 - 99 mg/dL    Comment: Glucose reference range applies only to samples taken after fasting for at least 8 hours.   Calcium , Ion 0.88 (LL) 1.15 - 1.40 mmol/L   TCO2 26 22 - 32 mmol/L   Hemoglobin 8.8 (L) 12.0 - 15.0 g/dL   HCT 73.9 (L) 63.9 - 53.9 %  I-STAT, chem 8     Status: Abnormal   Collection Time: 01/10/24 10:57 AM  Result Value Ref Range   Sodium 141 135 - 145 mmol/L   Potassium 4.0 3.5 - 5.1 mmol/L   Chloride 108 98 - 111 mmol/L   BUN 15 8 - 23 mg/dL   Creatinine, Ser 9.59 (L) 0.44 - 1.00 mg/dL   Glucose, Bld 858 (H) 70 - 99 mg/dL    Comment: Glucose reference range applies only to samples taken after fasting for at least 8 hours.   Calcium , Ion 0.91 (L) 1.15 - 1.40 mmol/L   TCO2 26 22 - 32 mmol/L   Hemoglobin 8.2 (L) 12.0 - 15.0 g/dL   HCT 75.9 (L) 63.9 - 53.9 %  Hemoglobin and hematocrit, blood     Status: Abnormal   Collection Time: 01/10/24 11:02 AM  Result Value Ref Range   Hemoglobin 8.3 (L) 12.0 - 15.0 g/dL    Comment: REPEATED TO VERIFY DELTA CHECK NOTED    HCT 24.0 (L) 36.0 - 46.0 %    Comment: Performed at [facility], [address], [place], [place] [address]  Platelet count     Status: Abnormal   Collection Time: 01/10/24 11:02 AM  Result Value Ref Range   Platelets 134 (L) 150 - 400 K/uL    Comment: Performed at [facility], [address] [place], [place] [address]   I-STAT 7, (LYTES, BLD GAS, ICA, H+H)     Status: Abnormal   Collection Time: 01/10/24 12:04 PM  Result Value Ref Range   pH, Arterial 7.442 7.35 - 7.45   pCO2 arterial 32.0 32 - 48 mmHg   pO2, Arterial 391 (H) 83 - 108 mmHg   Bicarbonate 21.8 20.0 - 28.0 mmol/L   TCO2 23 22 - 32 mmol/L   O2 Saturation 100 %   Acid-base deficit 2.0 0.0 - 2.0 mmol/L   Sodium 140 135 - 145 mmol/L   Potassium 5.7 (H) 3.5 - 5.1 mmol/L   Calcium , Ion 0.92 (L) 1.15 - 1.40 mmol/L   HCT 21.0 (L) 36.0 - 46.0 %   Hemoglobin 7.1 (L) 12.0 - 15.0 g/dL   Sample type ARTERIAL   I-STAT, chem 8     Status: Abnormal   Collection Time: 01/10/24 12:22 PM  Result Value Ref Range   Sodium 143 135 - 145 mmol/L   Potassium 4.2 3.5 - 5.1 mmol/L   Chloride 108 98 - 111 mmol/L   BUN 14 8 - 23 mg/dL   Creatinine, Ser 9.59 (L) 0.44 - 1.00 mg/dL   Glucose, Bld 854 (H) 70 - 99 mg/dL    Comment: Glucose reference range applies only to samples taken after fasting for at least 8 hours.   Calcium , Ion 0.92 (L) 1.15 - 1.40 mmol/L   TCO2 24 22 -  32 mmol/L   Hemoglobin 7.1 (L) 12.0 - 15.0 g/dL   HCT 78.9 (L) 63.9 - 53.9 %  I-STAT, chem 8     Status: Abnormal   Collection Time: 01/10/24 12:50 PM  Result Value Ref Range   Sodium 144 135 - 145 mmol/L   Potassium 4.2 3.5 - 5.1 mmol/L   Chloride 109 98 - 111 mmol/L   BUN 14 8 - 23 mg/dL   Creatinine, Ser 9.59 (L) 0.44 - 1.00 mg/dL   Glucose, Bld 854 (H) 70 - 99 mg/dL    Comment: Glucose reference range applies only to samples taken after fasting for at least 8 hours.   Calcium , Ion 0.89 (LL) 1.15 - 1.40 mmol/L   TCO2 26 22 - 32 mmol/L   Hemoglobin 7.8 (L) 12.0 - 15.0 g/dL   HCT 76.9 (L) 63.9 - 53.9 %  Hemoglobin and hematocrit, blood     Status: Abnormal   Collection Time: 01/10/24  1:42 PM  Result Value Ref Range   Hemoglobin 8.5 (L) 12.0 - 15.0 g/dL    Comment: RESULT CALLED TO, READ BACK BY AND VERIFIED WITH: K PRICE RN 1424 01/10/2024 WBOND    HCT 24.7 (L) 36.0 - 46.0 %     Comment: Performed at [facility], [address], [place], [place] [address]  Platelet count     Status: Abnormal   Collection Time: 01/10/24  1:42 PM  Result Value Ref Range   Platelets 100 (L) 150 - 400 K/uL    Comment: Performed at [facility], [address] [place], [place] [address]  Fibrinogen      Status: None   Collection Time: 01/10/24  1:42 PM  Result Value Ref Range   Fibrinogen  239 210 - 475 mg/dL    Comment: RESULT CALLED TO, READ BACK BY AND VERIFIED WITH: K PRICE RN 1424 01/10/2024 WBOND (NOTE) Fibrinogen  results may be underestimated in patients receiving thrombolytic therapy. Performed at [facility], [address] [place], [place] [address]   I-STAT, nathanael 8     Status: Abnormal   Collection Time: 01/10/24  1:50 PM  Result Value Ref Range   Sodium 146 (H) 135 - 145 mmol/L   Potassium 4.1 3.5 - 5.1 mmol/L   Chloride 109 98 - 111 mmol/L   BUN 11 8 - 23 mg/dL   Creatinine, Ser 9.69 (L) 0.44 - 1.00 mg/dL   Glucose, Bld 873 (H) 70 - 99 mg/dL    Comment: Glucose reference range applies only to samples taken after fasting for at least 8 hours.   Calcium , Ion 0.85 (LL) 1.15 - 1.40 mmol/L   TCO2 23 22 - 32 mmol/L   Hemoglobin 7.5 (L) 12.0 - 15.0 g/dL   HCT 77.9 (L) 63.9 - 53.9 %  Prepare platelet pheresis     Status: None   Collection Time: 01/10/24  2:21 PM  Result Value Ref Range   Unit Number [id number]    Blood Component Type PLTP3 PSORALEN TREATED    Unit division 00    Status of Unit ISSUED,FINAL    Transfusion Status      OK TO TRANSFUSE Performed at [facility], [address] [place], [place] [address]   BPAM Platelet Pheresis     Status: None   Collection Time: 01/10/24  2:21 PM  Result Value Ref Range   ISSUE DATE / TIME [id number]    Blood Product Unit Number [id number]    PRODUCT  CODE [id number]    Unit Type and Rh 7300    Blood Product Expiration Date [id number]   I-STAT 7, (LYTES, BLD GAS, ICA, H+H)      Status: Abnormal   Collection Time: 01/10/24  2:45 PM  Result Value Ref Range   pH, Arterial 7.425 7.35 - 7.45   pCO2 arterial 30.6 (L) 32 - 48 mmHg   pO2, Arterial 240 (H) 83 - 108 mmHg   Bicarbonate 20.1 20.0 - 28.0 mmol/L   TCO2 21 (L) 22 - 32 mmol/L   O2 Saturation 100 %   Acid-base deficit 4.0 (H) 0.0 - 2.0 mmol/L   Sodium 145 135 - 145 mmol/L   Potassium 4.2 3.5 - 5.1 mmol/L   Calcium , Ion 1.07 (L) 1.15 - 1.40 mmol/L   HCT 25.0 (L) 36.0 - 46.0 %   Hemoglobin 8.5 (L) 12.0 - 15.0 g/dL   Sample type ARTERIAL   I-STAT, chem 8     Status: Abnormal   Collection Time: 01/10/24  2:49 PM  Result Value Ref Range   Sodium 145 135 - 145 mmol/L   Potassium 4.3 3.5 - 5.1 mmol/L   Chloride 109 98 - 111 mmol/L   BUN 12 8 - 23 mg/dL   Creatinine, Ser 9.59 (L) 0.44 - 1.00 mg/dL   Glucose, Bld 781 (H) 70 - 99 mg/dL    Comment: Glucose reference range applies only to samples taken after fasting for at least 8 hours.   Calcium , Ion 1.08 (L) 1.15 - 1.40 mmol/L   TCO2 22 22 - 32 mmol/L   Hemoglobin 8.8 (L) 12.0 - 15.0 g/dL   HCT 73.9 (L) 63.9 - 53.9 %  Prepare fresh frozen plasma     Status: None   Collection Time: 01/10/24  3:31 PM  Result Value Ref Range   Unit Number [id number]    Blood Component Type THW PLS APHR    Unit division A0    Status of Unit REL FROM [facility]    Transfusion Status      OK TO TRANSFUSE Performed at [facility], [address], [place], [place] [address]    Unit Number [id number]    Blood Component Type THW PLS APHR    Unit division A0    Status of Unit REL FROM [facility]    Transfusion Status OK TO TRANSFUSE   BPAM FFP     Status: None   Collection Time: 01/10/24  3:31 PM  Result Value Ref Range   ISSUE DATE / TIME [id number]    Blood Product Unit Number [id number]    PRODUCT CODE [id number]    Unit Type and Rh 0600    Blood Product Expiration Date 202511102359    ISSUE DATE / TIME [id number]    Blood Product Unit Number [id number]     PRODUCT CODE [id number]    Unit Type and Rh 6200    Blood Product Expiration Date [id number]   CBC     Status: Abnormal   Collection Time: 01/10/24  3:55 PM  Result Value Ref Range   WBC 10.2 4.0 - 10.5 K/uL   RBC 4.35 3.87 - 5.11 MIL/uL   Hemoglobin 13.2 12.0 - 15.0 g/dL    Comment: REPEATED TO VERIFY   HCT 37.7 36.0 - 46.0 %   MCV 86.7 80.0 - 100.0 fL   MCH 30.3 26.0 - 34.0 pg   MCHC 35.0 30.0 - 36.0 g/dL   RDW 83.9 (H) 88.4 - 84.4 %  Platelets 144 (L) 150 - 400 K/uL   nRBC 0.0 0.0 - 0.2 %    Comment: Performed at [facility], [address] [place], [place] [address]  Protime-INR     Status: Abnormal   Collection Time: 01/10/24  3:55 PM  Result Value Ref Range   Prothrombin Time 20.4 (H) 11.4 - 15.2 seconds   INR 1.7 (H) 0.8 - 1.2    Comment: (NOTE) INR goal varies based on device and disease states. Performed at [facility], [address], [place], [place] [address]   APTT     Status: None   Collection Time: 01/10/24  3:55 PM  Result Value Ref Range   aPTT 34 24 - 36 seconds    Comment: Performed at [facility], [address] [place], [place] [address]  Glucose, capillary     Status: Abnormal   Collection Time: 01/10/24  4:04 PM  Result Value Ref Range   Glucose-Capillary 197 (H) 70 - 99 mg/dL    Comment: Glucose reference range applies only to samples taken after fasting for at least 8 hours.  I-STAT 7, (LYTES, BLD GAS, ICA, H+H)     Status: Abnormal   Collection Time: 01/10/24  4:06 PM  Result Value Ref Range   pH, Arterial 7.266 (L) 7.35 - 7.45   pCO2 arterial 41.4 32 - 48 mmHg   pO2, Arterial 93 83 - 108 mmHg   Bicarbonate 19.0 (L) 20.0 - 28.0 mmol/L   TCO2 20 (L) 22 - 32 mmol/L   O2 Saturation 96 %   Acid-base deficit 8.0 (H) 0.0 - 2.0 mmol/L   Sodium 147 (H) 135 - 145 mmol/L   Potassium 3.7 3.5 - 5.1 mmol/L   Calcium , Ion 1.06 (L) 1.15 - 1.40 mmol/L   HCT 35.0 (L) 36.0 - 46.0 %   Hemoglobin 11.9 (L) 12.0 - 15.0 g/dL   Patient  temperature 36.1 C    Sample type ARTERIAL   Glucose, capillary     Status: Abnormal   Collection Time: 01/10/24  5:04 PM  Result Value Ref Range   Glucose-Capillary 202 (H) 70 - 99 mg/dL    Comment: Glucose reference range applies only to samples taken after fasting for at least 8 hours.  Cooxemetry Panel (carboxy, met, total hgb, O2 sat)     Status: Abnormal   Collection Time: 01/10/24  5:07 PM  Result Value Ref Range   Total hemoglobin 12.0 12.0 - 16.0 g/dL   O2 Saturation 43.2 %   Carboxyhemoglobin 2.0 (H) 0.5 - 1.5 %   Methemoglobin <0.7 0.0 - 1.5 %    Comment: Performed at [facility], [address], [place], [place] [address]  I-STAT 7, (LYTES, BLD GAS, ICA, H+H)     Status: Abnormal   Collection Time: 01/10/24  5:07 PM  Result Value Ref Range   pH, Arterial 7.320 (L) 7.35 - 7.45   pCO2 arterial 41.8 32 - 48 mmHg   pO2, Arterial 84 83 - 108 mmHg   Bicarbonate 21.7 20.0 - 28.0 mmol/L   TCO2 23 22 - 32 mmol/L   O2 Saturation 96 %   Acid-base deficit 4.0 (H) 0.0 - 2.0 mmol/L   Sodium 152 (H) 135 - 145 mmol/L   Potassium 3.5 3.5 - 5.1 mmol/L   Calcium , Ion 0.96 (L) 1.15 - 1.40 mmol/L   HCT 29.0 (L) 36.0 - 46.0 %   Hemoglobin 9.9 (L) 12.0 - 15.0 g/dL  Patient temperature 36.4 C    Sample type ARTERIAL   Glucose, capillary     Status: Abnormal   Collection Time: 01/10/24  6:05 PM  Result Value Ref Range   Glucose-Capillary 207 (H) 70 - 99 mg/dL    Comment: Glucose reference range applies only to samples taken after fasting for at least 8 hours.  Glucose, capillary     Status: Abnormal   Collection Time: 01/10/24  7:05 PM  Result Value Ref Range   Glucose-Capillary 240 (H) 70 - 99 mg/dL    Comment: Glucose reference range applies only to samples taken after fasting for at least 8 hours.  I-STAT 7, (LYTES, BLD GAS, ICA, H+H)     Status: Abnormal   Collection Time: 01/10/24  7:07 PM  Result Value Ref Range   pH, Arterial 7.244 (L) 7.35 - 7.45   pCO2 arterial 48.3  (H) 32 - 48 mmHg   pO2, Arterial 117 (H) 83 - 108 mmHg   Bicarbonate 21.1 20.0 - 28.0 mmol/L   TCO2 23 22 - 32 mmol/L   O2 Saturation 98 %   Acid-base deficit 6.0 (H) 0.0 - 2.0 mmol/L   Sodium 151 (H) 135 - 145 mmol/L   Potassium 4.0 3.5 - 5.1 mmol/L   Calcium , Ion 1.23 1.15 - 1.40 mmol/L   HCT 32.0 (L) 36.0 - 46.0 %   Hemoglobin 10.9 (L) 12.0 - 15.0 g/dL   Patient temperature 63.8 C    Sample type ARTERIAL   Glucose, capillary     Status: Abnormal   Collection Time: 01/10/24  7:58 PM  Result Value Ref Range   Glucose-Capillary 265 (H) 70 - 99 mg/dL    Comment: Glucose reference range applies only to samples taken after fasting for at least 8 hours.  I-STAT 7, (LYTES, BLD GAS, ICA, H+H)     Status: Abnormal   Collection Time: 01/10/24  8:39 PM  Result Value Ref Range   pH, Arterial 7.282 (L) 7.35 - 7.45   pCO2 arterial 47.4 32 - 48 mmHg   pO2, Arterial 107 83 - 108 mmHg   Bicarbonate 22.5 20.0 - 28.0 mmol/L   TCO2 24 22 - 32 mmol/L   O2 Saturation 97 %   Acid-base deficit 4.0 (H) 0.0 - 2.0 mmol/L   Sodium 151 (H) 135 - 145 mmol/L   Potassium 3.5 3.5 - 5.1 mmol/L   Calcium , Ion 1.19 1.15 - 1.40 mmol/L   HCT 31.0 (L) 36.0 - 46.0 %   Hemoglobin 10.5 (L) 12.0 - 15.0 g/dL   Patient temperature 63.3 C    Sample type ARTERIAL   Glucose, capillary     Status: Abnormal   Collection Time: 01/10/24  9:15 PM  Result Value Ref Range   Glucose-Capillary 300 (H) 70 - 99 mg/dL    Comment: Glucose reference range applies only to samples taken after fasting for at least 8 hours.  CBC     Status: Abnormal   Collection Time: 01/10/24  9:44 PM  Result Value Ref Range   WBC 11.8 (H) 4.0 - 10.5 K/uL   RBC 4.12 3.87 - 5.11 MIL/uL   Hemoglobin 12.4 12.0 - 15.0 g/dL   HCT 63.9 63.9 - 53.9 %   MCV 87.4 80.0 - 100.0 fL   MCH 30.1 26.0 - 34.0 pg   MCHC 34.4 30.0 - 36.0 g/dL   RDW 82.8 (H) 88.4 - 84.4 %   Platelets 149 (L) 150 - 400 K/uL   nRBC 0.0  0.0 - 0.2 %    Comment: Performed at [facility], [address] [place], [place] [address]  Basic metabolic panel     Status: Abnormal   Collection Time: 01/10/24  9:44 PM  Result Value Ref Range   Sodium 148 (H) 135 - 145 mmol/L    Comment: DELTA CHECK NOTED   Potassium 3.1 (L) 3.5 - 5.1 mmol/L    Comment: DELTA CHECK NOTED   Chloride 115 (H) 98 - 111 mmol/L   CO2 21 (L) 22 - 32 mmol/L   Glucose, Bld 291 (H) 70 - 99 mg/dL    Comment: Glucose reference range applies only to samples taken after fasting for at least 8 hours.   BUN 13 8 - 23 mg/dL   Creatinine, Ser 9.11 0.44 - 1.00 mg/dL   Calcium  7.7 (L) 8.9 - 10.3 mg/dL   GFR, Estimated >39 >39 mL/min    Comment: (NOTE) Calculated using the CKD-EPI Creatinine Equation (2021)    Anion gap 12 5 - 15    Comment: Performed at [facility], [address], [place], [place] [address]  Magnesium      Status: Abnormal   Collection Time: 01/10/24  9:44 PM  Result Value Ref Range   Magnesium  3.4 (H) 1.7 - 2.4 mg/dL    Comment: Performed at [facility], [address] [place], [place] [address]  Glucose, capillary     Status: Abnormal   Collection Time: 01/10/24 10:04 PM  Result Value Ref Range   Glucose-Capillary 279 (H) 70 - 99 mg/dL    Comment: Glucose reference range applies only to samples taken after fasting for at least 8 hours.  Glucose, capillary     Status: Abnormal   Collection Time: 01/10/24 11:18 PM  Result Value Ref Range   Glucose-Capillary 238 (H) 70 - 99 mg/dL    Comment: Glucose reference range applies only to samples taken after fasting for at least 8 hours.  I-STAT 7, (LYTES, BLD GAS, ICA, H+H)     Status: Abnormal   Collection Time: 01/10/24 11:23 PM  Result Value Ref Range   pH, Arterial 7.294 (L) 7.35 - 7.45   pCO2 arterial 43.0 32 - 48 mmHg   pO2, Arterial 128 (H) 83 - 108 mmHg   Bicarbonate 20.9 20.0 - 28.0 mmol/L   TCO2 22 22 - 32 mmol/L   O2 Saturation 99 %   Acid-base deficit 5.0 (H) 0.0 - 2.0 mmol/L   Sodium 150 (H) 135 -  145 mmol/L   Potassium 3.1 (L) 3.5 - 5.1 mmol/L   Calcium , Ion 1.17 1.15 - 1.40 mmol/L   HCT 34.0 (L) 36.0 - 46.0 %   Hemoglobin 11.6 (L) 12.0 - 15.0 g/dL   Patient temperature 62.9 C    Sample type ARTERIAL   Glucose, capillary     Status: Abnormal   Collection Time: 01/11/24 12:09 AM  Result Value Ref Range   Glucose-Capillary 226 (H) 70 - 99 mg/dL    Comment: Glucose reference range applies only to samples taken after fasting for at least 8 hours.  Glucose, capillary     Status: Abnormal   Collection Time: 01/11/24  1:06 AM  Result Value Ref Range   Glucose-Capillary 257 (H) 70 - 99 mg/dL    Comment: Glucose reference range applies only to samples taken after fasting for at least 8 hours.  Cooxemetry Panel (carboxy, met, total hgb, O2 sat)     Status: Abnormal  Collection Time: 01/11/24  1:10 AM  Result Value Ref Range   Total hemoglobin 11.6 (L) 12.0 - 16.0 g/dL   O2 Saturation 36.2 %   Carboxyhemoglobin 2.2 (H) 0.5 - 1.5 %   Methemoglobin <0.7 0.0 - 1.5 %    Comment: Performed at [facility], [address], [place], [place] [address]  I-STAT 7, (LYTES, BLD GAS, ICA, H+H)     Status: Abnormal   Collection Time: 01/11/24  1:33 AM  Result Value Ref Range   pH, Arterial 7.254 (L) 7.35 - 7.45   pCO2 arterial 48.1 (H) 32 - 48 mmHg   pO2, Arterial 89 83 - 108 mmHg   Bicarbonate 21.3 20.0 - 28.0 mmol/L   TCO2 23 22 - 32 mmol/L   O2 Saturation 95 %   Acid-base deficit 6.0 (H) 0.0 - 2.0 mmol/L   Sodium 151 (H) 135 - 145 mmol/L   Potassium 3.3 (L) 3.5 - 5.1 mmol/L   Calcium , Ion 1.19 1.15 - 1.40 mmol/L   HCT 30.0 (L) 36.0 - 46.0 %   Hemoglobin 10.2 (L) 12.0 - 15.0 g/dL   Patient temperature 63.0 C    Sample type ARTERIAL   Glucose, capillary     Status: Abnormal   Collection Time: 01/11/24  2:45 AM  Result Value Ref Range   Glucose-Capillary 229 (H) 70 - 99 mg/dL    Comment: Glucose reference range applies only to samples taken after fasting for at least 8 hours.   CBC     Status: Abnormal   Collection Time: 01/11/24  4:17 AM  Result Value Ref Range   WBC 11.3 (H) 4.0 - 10.5 K/uL   RBC 4.11 3.87 - 5.11 MIL/uL   Hemoglobin 12.5 12.0 - 15.0 g/dL   HCT 64.3 (L) 63.9 - 53.9 %   MCV 86.6 80.0 - 100.0 fL   MCH 30.4 26.0 - 34.0 pg   MCHC 35.1 30.0 - 36.0 g/dL   RDW 82.4 (H) 88.4 - 84.4 %   Platelets 144 (L) 150 - 400 K/uL   nRBC 0.0 0.0 - 0.2 %    Comment: Performed at [facility], [address] [place], [place] [address]  Basic metabolic panel     Status: Abnormal   Collection Time: 01/11/24  4:17 AM  Result Value Ref Range   Sodium 146 (H) 135 - 145 mmol/L   Potassium 3.3 (L) 3.5 - 5.1 mmol/L   Chloride 116 (H) 98 - 111 mmol/L   CO2 18 (L) 22 - 32 mmol/L   Glucose, Bld 207 (H) 70 - 99 mg/dL    Comment: Glucose reference range applies only to samples taken after fasting for at least 8 hours.   BUN 15 8 - 23 mg/dL   Creatinine, Ser 8.95 (H) 0.44 - 1.00 mg/dL   Calcium  7.8 (L) 8.9 - 10.3 mg/dL   GFR, Estimated 59 (L) >60 mL/min    Comment: (NOTE) Calculated using the CKD-EPI Creatinine Equation (2021)    Anion gap 12 5 - 15    Comment: Performed at [facility], [address], [place], [place] [address]  Magnesium      Status: Abnormal   Collection Time: 01/11/24  4:17 AM  Result Value Ref Range   Magnesium  2.7 (H) 1.7 - 2.4 mg/dL    Comment: Performed at [facility], [address], [place], [place] [address]  Glucose, capillary     Status: Abnormal   Collection Time:  01/11/24  4:21 AM  Result Value Ref Range   Glucose-Capillary 197 (H) 70 - 99 mg/dL    Comment: Glucose reference range applies only to samples taken after fasting for at least 8 hours.  I-STAT 7, (LYTES, BLD GAS, ICA, H+H)     Status: Abnormal   Collection Time: 01/11/24  4:23 AM  Result Value Ref Range   pH, Arterial 7.308 (L) 7.35 - 7.45   pCO2 arterial 37.0 32 - 48 mmHg   pO2, Arterial 99 83 - 108 mmHg   Bicarbonate 18.5 (L) 20.0 - 28.0 mmol/L    TCO2 20 (L) 22 - 32 mmol/L   O2 Saturation 97 %   Acid-base deficit 7.0 (H) 0.0 - 2.0 mmol/L   Sodium 151 (H) 135 - 145 mmol/L   Potassium 3.1 (L) 3.5 - 5.1 mmol/L   Calcium , Ion 1.19 1.15 - 1.40 mmol/L   HCT 34.0 (L) 36.0 - 46.0 %   Hemoglobin 11.6 (L) 12.0 - 15.0 g/dL   Patient temperature 62.7 C    Sample type ARTERIAL   Glucose, capillary     Status: Abnormal   Collection Time: 01/11/24  5:16 AM  Result Value Ref Range   Glucose-Capillary 176 (H) 70 - 99 mg/dL    Comment: Glucose reference range applies only to samples taken after fasting for at least 8 hours.  Cooxemetry Panel (carboxy, met, total hgb, O2 sat)     Status: Abnormal   Collection Time: 01/11/24  5:19 AM  Result Value Ref Range   Total hemoglobin 12.5 12.0 - 16.0 g/dL   O2 Saturation 39.3 %   Carboxyhemoglobin 2.0 (H) 0.5 - 1.5 %   Methemoglobin 1.3 0.0 - 1.5 %    Comment: Performed at [facility], [address], [place], [place] [address]  Glucose, capillary     Status: Abnormal   Collection Time: 01/11/24  6:18 AM  Result Value Ref Range   Glucose-Capillary 179 (H) 70 - 99 mg/dL    Comment: Glucose reference range applies only to samples taken after fasting for at least 8 hours.  Glucose, capillary     Status: Abnormal   Collection Time: 01/11/24  7:54 AM  Result Value Ref Range   Glucose-Capillary 154 (H) 70 - 99 mg/dL    Comment: Glucose reference range applies only to samples taken after fasting for at least 8 hours.  I-STAT 7, (LYTES, BLD GAS, ICA, H+H)     Status: Abnormal   Collection Time: 01/11/24  7:57 AM  Result Value Ref Range   pH, Arterial 7.352 7.35 - 7.45   pCO2 arterial 36.3 32 - 48 mmHg   pO2, Arterial 89 83 - 108 mmHg   Bicarbonate 20.0 20.0 - 28.0 mmol/L   TCO2 21 (L) 22 - 32 mmol/L   O2 Saturation 96 %   Acid-base deficit 5.0 (H) 0.0 - 2.0 mmol/L   Sodium 151 (H) 135 - 145 mmol/L   Potassium 3.2 (L) 3.5 - 5.1 mmol/L   Calcium , Ion 1.18 1.15 - 1.40 mmol/L   HCT 32.0 (L)  36.0 - 46.0 %   Hemoglobin 10.9 (L) 12.0 - 15.0 g/dL   Patient temperature 62.6 C    Sample type ARTERIAL   Glucose, capillary     Status: Abnormal   Collection Time: 01/11/24  8:53 AM  Result Value Ref Range   Glucose-Capillary 147 (H) 70 - 99 mg/dL    Comment: Glucose reference range applies only to samples taken after  fasting for at least 8 hours.  I-STAT 7, (LYTES, BLD GAS, ICA, H+H)     Status: Abnormal   Collection Time: 01/11/24  8:56 AM  Result Value Ref Range   pH, Arterial 7.387 7.35 - 7.45   pCO2 arterial 32.1 32 - 48 mmHg   pO2, Arterial 64 (L) 83 - 108 mmHg   Bicarbonate 19.3 (L) 20.0 - 28.0 mmol/L   TCO2 20 (L) 22 - 32 mmol/L   O2 Saturation 92 %   Acid-base deficit 5.0 (H) 0.0 - 2.0 mmol/L   Sodium 150 (H) 135 - 145 mmol/L   Potassium 3.1 (L) 3.5 - 5.1 mmol/L   Calcium , Ion 1.16 1.15 - 1.40 mmol/L   HCT 32.0 (L) 36.0 - 46.0 %   Hemoglobin 10.9 (L) 12.0 - 15.0 g/dL   Patient temperature 62.8 C    Sample type ARTERIAL   Glucose, capillary     Status: Abnormal   Collection Time: 01/11/24 10:01 AM  Result Value Ref Range   Glucose-Capillary 123 (H) 70 - 99 mg/dL    Comment: Glucose reference range applies only to samples taken after fasting for at least 8 hours.  Glucose, capillary     Status: None   Collection Time: 01/11/24 11:43 AM  Result Value Ref Range   Glucose-Capillary 92 70 - 99 mg/dL    Comment: Glucose reference range applies only to samples taken after fasting for at least 8 hours.  Basic metabolic panel with GFR     Status: Abnormal   Collection Time: 01/11/24 12:00 PM  Result Value Ref Range   Sodium 145 135 - 145 mmol/L   Potassium 4.4 3.5 - 5.1 mmol/L   Chloride 113 (H) 98 - 111 mmol/L   CO2 20 (L) 22 - 32 mmol/L   Glucose, Bld 87 70 - 99 mg/dL    Comment: Glucose reference range applies only to samples taken after fasting for at least 8 hours.   BUN 16 8 - 23 mg/dL   Creatinine, Ser 9.06 0.44 - 1.00 mg/dL   Calcium  7.5 (L) 8.9 - 10.3 mg/dL    GFR, Estimated >39 >39 mL/min    Comment: (NOTE) Calculated using the CKD-EPI Creatinine Equation (2021)    Anion gap 12 5 - 15    Comment: Performed at [facility], [address] [place], [place] [address]  Hemoglobin A1c     Status: None   Collection Time: 01/11/24 12:00 PM  Result Value Ref Range   Hgb A1c MFr Bld 5.4 4.8 - 5.6 %    Comment: (NOTE) Diagnosis of Diabetes The following HbA1c ranges recommended by the American Diabetes Association (ADA) may be used as an aid in the diagnosis of diabetes mellitus.  Hemoglobin             Suggested A1C NGSP%              Diagnosis  <5.7                   Non Diabetic  5.7-6.4                Pre-Diabetic  >6.4                   Diabetic  <7.0                   Glycemic control for  adults with diabetes.     Mean Plasma Glucose 108.28 mg/dL    Comment: Performed at [facility], [address] [place], [place] [address]  Glucose, capillary     Status: Abnormal   Collection Time: 01/11/24 12:39 PM  Result Value Ref Range   Glucose-Capillary 112 (H) 70 - 99 mg/dL    Comment: Glucose reference range applies only to samples taken after fasting for at least 8 hours.  Glucose, capillary     Status: None   Collection Time: 01/11/24  1:50 PM  Result Value Ref Range   Glucose-Capillary 98 70 - 99 mg/dL    Comment: Glucose reference range applies only to samples taken after fasting for at least 8 hours.  Glucose, capillary     Status: None   Collection Time: 01/11/24  2:42 PM  Result Value Ref Range   Glucose-Capillary 92 70 - 99 mg/dL    Comment: Glucose reference range applies only to samples taken after fasting for at least 8 hours.  Glucose, capillary     Status: Abnormal   Collection Time: 01/11/24  4:16 PM  Result Value Ref Range   Glucose-Capillary 111 (H) 70 - 99 mg/dL    Comment: Glucose reference range applies only to samples taken after fasting for at least 8 hours.  Magnesium       Status: None   Collection Time: 01/11/24  4:55 PM  Result Value Ref Range   Magnesium  2.3 1.7 - 2.4 mg/dL    Comment: Performed at [facility], [address], [place], [place] [address]  CBC     Status: Abnormal   Collection Time: 01/11/24  4:55 PM  Result Value Ref Range   WBC 11.5 (H) 4.0 - 10.5 K/uL   RBC 3.59 (L) 3.87 - 5.11 MIL/uL   Hemoglobin 10.8 (L) 12.0 - 15.0 g/dL   HCT 69.1 (L) 63.9 - 53.9 %   MCV 85.8 80.0 - 100.0 fL   MCH 30.1 26.0 - 34.0 pg   MCHC 35.1 30.0 - 36.0 g/dL   RDW 82.6 (H) 88.4 - 84.4 %   Platelets 90 (L) 150 - 400 K/uL    Comment: REPEATED TO VERIFY PLATELET COUNT CONFIRMED BY SMEAR SPECIMEN CHECKED FOR CLOTS Immature Platelet Fraction may be clinically indicated, consider ordering this additional test [id number]    nRBC 0.0 0.0 - 0.2 %    Comment: Performed at [facility], [address] [place], [place] [address]  Comprehensive metabolic panel with GFR     Status: Abnormal   Collection Time: 01/11/24  4:55 PM  Result Value Ref Range   Sodium 146 (H) 135 - 145 mmol/L   Potassium 4.2 3.5 - 5.1 mmol/L   Chloride 111 98 - 111 mmol/L   CO2 21 (L) 22 - 32 mmol/L   Glucose, Bld 120 (H) 70 - 99 mg/dL    Comment: Glucose reference range applies only to samples taken after fasting for at least 8 hours.   BUN 16 8 - 23 mg/dL   Creatinine, Ser 9.01 0.44 - 1.00 mg/dL   Calcium  7.9 (L) 8.9 - 10.3 mg/dL   Total Protein 4.6 (L) 6.5 - 8.1 g/dL   Albumin  3.0 (L) 3.5 - 5.0 g/dL   AST 892 (H) 15 - 41 U/L   ALT 29 0 - 44 U/L   Alkaline Phosphatase 160 (H) 38 - 126 U/L   Total Bilirubin 2.2 (H) 0.0 - 1.2 mg/dL   GFR,  Estimated >60 >60 mL/min    Comment: (NOTE) Calculated using the CKD-EPI Creatinine Equation (2021)    Anion gap 14 5 - 15    Comment: Performed at [facility], [address] [place], [place] [address]  Glucose, capillary     Status: Abnormal   Collection Time: 01/11/24  8:42 PM  Result Value Ref Range    Glucose-Capillary 143 (H) 70 - 99 mg/dL    Comment: Glucose reference range applies only to samples taken after fasting for at least 8 hours.  Glucose, capillary     Status: Abnormal   Collection Time: 01/11/24 11:06 PM  Result Value Ref Range   Glucose-Capillary 139 (H) 70 - 99 mg/dL    Comment: Glucose reference range applies only to samples taken after fasting for at least 8 hours.  Glucose, capillary     Status: Abnormal   Collection Time: 01/12/24  3:10 AM  Result Value Ref Range   Glucose-Capillary 148 (H) 70 - 99 mg/dL    Comment: Glucose reference range applies only to samples taken after fasting for at least 8 hours.  Cooxemetry Panel (carboxy, met, total hgb, O2 sat)     Status: Abnormal   Collection Time: 01/12/24  4:50 AM  Result Value Ref Range   Total hemoglobin 10.5 (L) 12.0 - 16.0 g/dL   O2 Saturation 33.3 %   Carboxyhemoglobin 2.5 (H) 0.5 - 1.5 %   Methemoglobin <0.7 0.0 - 1.5 %    Comment: Performed at [facility], [address] [address], [place], [place] [address]  Basic metabolic panel     Status: Abnormal   Collection Time: 01/12/24  4:50 AM  Result Value Ref Range   Sodium 145 135 - 145 mmol/L   Potassium 4.3 3.5 - 5.1 mmol/L   Chloride 112 (H) 98 - 111 mmol/L   CO2 20 (L) 22 - 32 mmol/L   Glucose, Bld 161 (H) 70 - 99 mg/dL    Comment: Glucose reference range applies only to samples taken after fasting for at least 8 hours.   BUN 24 (H) 8 - 23 mg/dL   Creatinine, Ser 8.71 (H) 0.44 - 1.00 mg/dL   Calcium  8.0 (L) 8.9 - 10.3 mg/dL   GFR, Estimated 46 (L) >60 mL/min    Comment: (NOTE) Calculated using the CKD-EPI Creatinine Equation (2021)    Anion gap 13 5 - 15    Comment: Performed at [facility], [address], [place], [place] [address]  CBC     Status: Abnormal   Collection Time: 01/12/24  4:50 AM  Result Value Ref Range   WBC 9.5 4.0 - 10.5 K/uL   RBC 3.54 (L) 3.87 - 5.11 MIL/uL   Hemoglobin 10.7 (L) 12.0 - 15.0 g/dL   HCT 69.3 (L) 63.9 -  46.0 %   MCV 86.4 80.0 - 100.0 fL   MCH 30.2 26.0 - 34.0 pg   MCHC 35.0 30.0 - 36.0 g/dL   RDW 82.0 (H) 88.4 - 84.4 %   Platelets 76 (L) 150 - 400 K/uL    Comment: REPEATED TO VERIFY PLATELET COUNT CONFIRMED BY SMEAR Immature Platelet Fraction may be clinically indicated, consider ordering this additional test [id number]    nRBC 0.5 (H) 0.0 - 0.2 %    Comment: Performed at [facility], [address], [place], [place] [address]  Comprehensive metabolic panel with GFR     Status: Abnormal   Collection Time: 01/12/24  8:12  AM  Result Value Ref Range   Sodium 142 135 - 145 mmol/L   Potassium 4.3 3.5 - 5.1 mmol/L   Chloride 111 98 - 111 mmol/L   CO2 19 (L) 22 - 32 mmol/L   Glucose, Bld 176 (H) 70 - 99 mg/dL    Comment: Glucose reference range applies only to samples taken after fasting for at least 8 hours.   BUN 26 (H) 8 - 23 mg/dL   Creatinine, Ser 8.58 (H) 0.44 - 1.00 mg/dL   Calcium  7.9 (L) 8.9 - 10.3 mg/dL   Total Protein 4.7 (L) 6.5 - 8.1 g/dL   Albumin  2.9 (L) 3.5 - 5.0 g/dL   AST 5,302 (H) 15 - 41 U/L    Comment: RESULT CONFIRMED BY MANUAL DILUTION   ALT 1,510 (H) 0 - 44 U/L   Alkaline Phosphatase 154 (H) 38 - 126 U/L   Total Bilirubin 2.4 (H) 0.0 - 1.2 mg/dL   GFR, Estimated 41 (L) >60 mL/min    Comment: (NOTE) Calculated using the CKD-EPI Creatinine Equation (2021)    Anion gap 12 5 - 15    Comment: Performed at [facility], [address] [place], [place] [address]  Glucose, capillary     Status: Abnormal   Collection Time: 01/12/24  8:26 AM  Result Value Ref Range   Glucose-Capillary 171 (H) 70 - 99 mg/dL    Comment: Glucose reference range applies only to samples taken after fasting for at least 8 hours.  Glucose, capillary     Status: Abnormal   Collection Time: 01/12/24 11:33 AM  Result Value Ref Range   Glucose-Capillary 164 (H) 70 - 99 mg/dL    Comment: Glucose reference range applies only to samples taken after fasting for at least 8 hours.   Comprehensive metabolic panel     Status: Abnormal   Collection Time: 01/12/24 12:17 PM  Result Value Ref Range   Sodium 143 135 - 145 mmol/L   Potassium 4.0 3.5 - 5.1 mmol/L   Chloride 112 (H) 98 - 111 mmol/L   CO2 19 (L) 22 - 32 mmol/L   Glucose, Bld 174 (H) 70 - 99 mg/dL    Comment: Glucose reference range applies only to samples taken after fasting for at least 8 hours.   BUN 29 (H) 8 - 23 mg/dL   Creatinine, Ser 8.59 (H) 0.44 - 1.00 mg/dL   Calcium  8.0 (L) 8.9 - 10.3 mg/dL   Total Protein 4.9 (L) 6.5 - 8.1 g/dL   Albumin  3.0 (L) 3.5 - 5.0 g/dL   AST 5,909 (H) 15 - 41 U/L    Comment: RESULT CONFIRMED BY MANUAL DILUTION   ALT 1,254 (H) 0 - 44 U/L   Alkaline Phosphatase 157 (H) 38 - 126 U/L   Total Bilirubin 2.2 (H) 0.0 - 1.2 mg/dL   GFR, Estimated 41 (L) >60 mL/min    Comment: (NOTE) Calculated using the CKD-EPI Creatinine Equation (2021)    Anion gap 12 5 - 15    Comment: Performed at [facility], [address], [place], [place] [address]  ECHOCARDIOGRAM COMPLETE     Status: None   Collection Time: 01/12/24  2:38 PM  Result Value Ref Range   Weight 2,832.47 oz   Height 64 in   BP 114/65 mmHg   Single Plane A2C EF 71.1 %   Single Plane A4C EF 70.2 %   Calc EF 69.3 %   S' Lateral 2.60 cm  AR max vel 2.90 cm2   AV Peak grad 5.3 mmHg   Ao pk vel 1.15 m/s   Area-P 1/2 3.99 cm2   Est EF 65 - 70%   Glucose, capillary     Status: Abnormal   Collection Time: 01/12/24  4:28 PM  Result Value Ref Range   Glucose-Capillary 139 (H) 70 - 99 mg/dL    Comment: Glucose reference range applies only to samples taken after fasting for at least 8 hours.  Glucose, capillary     Status: Abnormal   Collection Time: 01/12/24  9:03 PM  Result Value Ref Range   Glucose-Capillary 114 (H) 70 - 99 mg/dL    Comment: Glucose reference range applies only to samples taken after fasting for at least 8 hours.  Cooxemetry Panel (carboxy, met, total hgb, O2 sat)     Status: Abnormal    Collection Time: 01/13/24  4:58 AM  Result Value Ref Range   Total hemoglobin 11.4 (L) 12.0 - 16.0 g/dL   O2 Saturation 42.4 %   Carboxyhemoglobin 1.6 (H) 0.5 - 1.5 %   Methemoglobin <0.7 0.0 - 1.5 %    Comment: Performed at [facility], [address] [place], [place] [address]  CBC     Status: Abnormal   Collection Time: 01/13/24  5:00 AM  Result Value Ref Range   WBC 8.7 4.0 - 10.5 K/uL   RBC 3.62 (L) 3.87 - 5.11 MIL/uL   Hemoglobin 11.0 (L) 12.0 - 15.0 g/dL   HCT 67.8 (L) 63.9 - 53.9 %   MCV 88.7 80.0 - 100.0 fL   MCH 30.4 26.0 - 34.0 pg   MCHC 34.3 30.0 - 36.0 g/dL   RDW 82.4 (H) 88.4 - 84.4 %   Platelets 57 (L) 150 - 400 K/uL    Comment: REPEATED TO VERIFY Immature Platelet Fraction may be clinically indicated, consider ordering this additional test [id number]    nRBC 1.3 (H) 0.0 - 0.2 %    Comment: Performed at [facility], [address] [place], [place] [address]  Comprehensive metabolic panel     Status: Abnormal   Collection Time: 01/13/24  5:00 AM  Result Value Ref Range   Sodium 146 (H) 135 - 145 mmol/L   Potassium 3.7 3.5 - 5.1 mmol/L   Chloride 112 (H) 98 - 111 mmol/L   CO2 22 22 - 32 mmol/L   Glucose, Bld 129 (H) 70 - 99 mg/dL    Comment: Glucose reference range applies only to samples taken after fasting for at least 8 hours.   BUN 34 (H) 8 - 23 mg/dL   Creatinine, Ser 8.54 (H) 0.44 - 1.00 mg/dL   Calcium  8.1 (L) 8.9 - 10.3 mg/dL   Total Protein 5.2 (L) 6.5 - 8.1 g/dL   Albumin  2.9 (L) 3.5 - 5.0 g/dL   AST 8,794 (H) 15 - 41 U/L   ALT 548 (H) 0 - 44 U/L   Alkaline Phosphatase 157 (H) 38 - 126 U/L   Total Bilirubin 2.5 (H) 0.0 - 1.2 mg/dL   GFR, Estimated 39 (L) >60 mL/min    Comment: (NOTE) Calculated using the CKD-EPI Creatinine Equation (2021)    Anion gap 12 5 - 15    Comment: Performed at [facility], [address] [place], [place] [address]  Glucose, capillary     Status: Abnormal   Collection Time: 01/13/24  6:19 AM   Result Value Ref Range   Glucose-Capillary  135 (H) 70 - 99 mg/dL    Comment: Glucose reference range applies only to samples taken after fasting for at least 8 hours.  Heparin  induced platelet Ab (HIT antibody)     Status: None   Collection Time: 01/13/24  9:53 AM  Result Value Ref Range   Heparin  Induced Plt Ab 0.055 0.000 - 0.400 OD    Comment: (NOTE) Performed At: [facility] [address] [place], [place] [id number] Jennette Shorter MD Ey:[id number]   Glucose, capillary     Status: Abnormal   Collection Time: 01/13/24 11:27 AM  Result Value Ref Range   Glucose-Capillary 145 (H) 70 - 99 mg/dL    Comment: Glucose reference range applies only to samples taken after fasting for at least 8 hours.  Glucose, capillary     Status: Abnormal   Collection Time: 01/13/24  3:57 PM  Result Value Ref Range   Glucose-Capillary 175 (H) 70 - 99 mg/dL    Comment: Glucose reference range applies only to samples taken after fasting for at least 8 hours.  Glucose, capillary     Status: Abnormal   Collection Time: 01/13/24  9:07 PM  Result Value Ref Range   Glucose-Capillary 144 (H) 70 - 99 mg/dL    Comment: Glucose reference range applies only to samples taken after fasting for at least 8 hours.  Comprehensive metabolic panel     Status: Abnormal   Collection Time: 01/14/24  4:24 AM  Result Value Ref Range   Sodium 144 135 - 145 mmol/L   Potassium 3.1 (L) 3.5 - 5.1 mmol/L   Chloride 108 98 - 111 mmol/L   CO2 27 22 - 32 mmol/L   Glucose, Bld 146 (H) 70 - 99 mg/dL    Comment: Glucose reference range applies only to samples taken after fasting for at least 8 hours.   BUN 35 (H) 8 - 23 mg/dL   Creatinine, Ser 8.77 (H) 0.44 - 1.00 mg/dL   Calcium  8.1 (L) 8.9 - 10.3 mg/dL   Total Protein 5.3 (L) 6.5 - 8.1 g/dL   Albumin  2.8 (L) 3.5 - 5.0 g/dL   AST 601 (H) 15 - 41 U/L   ALT 286 (H) 0 - 44 U/L   Alkaline Phosphatase 180 (H) 38 - 126 U/L   Total Bilirubin 2.4 (H) 0.0 - 1.2 mg/dL   GFR,  Estimated 48 (L) >60 mL/min    Comment: (NOTE) Calculated using the CKD-EPI Creatinine Equation (2021)    Anion gap 9 5 - 15    Comment: Performed at [facility], [address], [place], [place] [address]  CBC     Status: Abnormal   Collection Time: 01/14/24  4:24 AM  Result Value Ref Range   WBC 8.6 4.0 - 10.5 K/uL   RBC 3.59 (L) 3.87 - 5.11 MIL/uL   Hemoglobin 10.9 (L) 12.0 - 15.0 g/dL   HCT 67.5 (L) 63.9 - 53.9 %   MCV 90.3 80.0 - 100.0 fL   MCH 30.4 26.0 - 34.0 pg   MCHC 33.6 30.0 - 36.0 g/dL   RDW 82.8 (H) 88.4 - 84.4 %   Platelets 66 (L) 150 - 400 K/uL    Comment: REPEATED TO VERIFY SPECIMEN CHECKED FOR CLOTS Immature Platelet Fraction may be clinically indicated, consider ordering this additional test [id number]    nRBC 2.0 (H) 0.0 - 0.2 %    Comment: Performed at [facility], [address], [place], [place] [address]  Magnesium   Status: None   Collection Time: 01/14/24  4:24 AM  Result Value Ref Range   Magnesium  1.9 1.7 - 2.4 mg/dL    Comment: Performed at [facility], [address] [place], [place] [address]  Cooxemetry Panel (carboxy, met, total hgb, O2 sat)     Status: Abnormal   Collection Time: 01/14/24  5:23 AM  Result Value Ref Range   Total hemoglobin 11.4 (L) 12.0 - 16.0 g/dL   O2 Saturation 56.3 %   Carboxyhemoglobin 0.5 0.5 - 1.5 %   Methemoglobin <0.7 0.0 - 1.5 %    Comment: Performed at [facility], [address], [place], [place] [address]  Glucose, capillary     Status: Abnormal   Collection Time: 01/14/24  6:49 AM  Result Value Ref Range   Glucose-Capillary 147 (H) 70 - 99 mg/dL    Comment: Glucose reference range applies only to samples taken after fasting for at least 8 hours.  Cooxemetry Panel (carboxy, met, total hgb, O2 sat)     Status: Abnormal   Collection Time: 01/14/24  9:38 AM  Result Value Ref Range   Total hemoglobin 11.5 (L) 12.0 - 16.0 g/dL   O2 Saturation 48.0 %   Carboxyhemoglobin 2.0 (H) 0.5  - 1.5 %   Methemoglobin <0.7 0.0 - 1.5 %    Comment: Performed at [facility], [address], [place], [place] [address]  Glucose, capillary     Status: Abnormal   Collection Time: 01/14/24 11:11 AM  Result Value Ref Range   Glucose-Capillary 170 (H) 70 - 99 mg/dL    Comment: Glucose reference range applies only to samples taken after fasting for at least 8 hours.  Basic metabolic panel with GFR     Status: Abnormal   Collection Time: 01/14/24  2:30 PM  Result Value Ref Range   Sodium 145 135 - 145 mmol/L   Potassium 3.9 3.5 - 5.1 mmol/L   Chloride 106 98 - 111 mmol/L   CO2 24 22 - 32 mmol/L   Glucose, Bld 163 (H) 70 - 99 mg/dL    Comment: Glucose reference range applies only to samples taken after fasting for at least 8 hours.   BUN 36 (H) 8 - 23 mg/dL   Creatinine, Ser 8.88 (H) 0.44 - 1.00 mg/dL   Calcium  8.4 (L) 8.9 - 10.3 mg/dL   GFR, Estimated 54 (L) >60 mL/min    Comment: (NOTE) Calculated using the CKD-EPI Creatinine Equation (2021)    Anion gap 15 5 - 15    Comment: Performed at [facility], [address], [place], [place] [address]  Glucose, capillary     Status: Abnormal   Collection Time: 01/14/24  4:28 PM  Result Value Ref Range   Glucose-Capillary 129 (H) 70 - 99 mg/dL    Comment: Glucose reference range applies only to samples taken after fasting for at least 8 hours.  Glucose, capillary     Status: Abnormal   Collection Time: 01/14/24  9:32 PM  Result Value Ref Range   Glucose-Capillary 114 (H) 70 - 99 mg/dL    Comment: Glucose reference range applies only to samples taken after fasting for at least 8 hours.  Comprehensive metabolic panel     Status: Abnormal   Collection Time: 01/15/24  3:17 AM  Result Value Ref Range   Sodium 143 135 - 145 mmol/L   Potassium 3.3 (L) 3.5 - 5.1 mmol/L   Chloride 105 98 -  111 mmol/L   CO2 27 22 - 32 mmol/L   Glucose, Bld 118 (H) 70 - 99 mg/dL    Comment: Glucose reference range applies only to samples taken  after fasting for at least 8 hours.   BUN 34 (H) 8 - 23 mg/dL   Creatinine, Ser 8.95 (H) 0.44 - 1.00 mg/dL   Calcium  8.3 (L) 8.9 - 10.3 mg/dL   Total Protein 5.3 (L) 6.5 - 8.1 g/dL   Albumin  2.7 (L) 3.5 - 5.0 g/dL   AST 701 (H) 15 - 41 U/L   ALT 282 (H) 0 - 44 U/L   Alkaline Phosphatase 206 (H) 38 - 126 U/L   Total Bilirubin 2.3 (H) 0.0 - 1.2 mg/dL   GFR, Estimated 59 (L) >60 mL/min    Comment: (NOTE) Calculated using the CKD-EPI Creatinine Equation (2021)    Anion gap 11 5 - 15    Comment: Performed at [facility], [address] [place], [place] [address]  Magnesium      Status: None   Collection Time: 01/15/24  3:17 AM  Result Value Ref Range   Magnesium  2.2 1.7 - 2.4 mg/dL    Comment: Performed at [facility], [address] [place], [place] [address]  Cooxemetry Panel (carboxy, met, total hgb, O2 sat)     Status: Abnormal   Collection Time: 01/15/24  4:59 AM  Result Value Ref Range   Total hemoglobin 11.1 (L) 12.0 - 16.0 g/dL   O2 Saturation 59.8 %   Carboxyhemoglobin 0.6 0.5 - 1.5 %   Methemoglobin 0.9 0.0 - 1.5 %    Comment: Performed at [facility], [address] [place], [place] [address]  Glucose, capillary     Status: Abnormal   Collection Time: 01/15/24  7:19 AM  Result Value Ref Range   Glucose-Capillary 138 (H) 70 - 99 mg/dL    Comment: Glucose reference range applies only to samples taken after fasting for at least 8 hours.  CBC     Status: Abnormal   Collection Time: 01/15/24  8:10 AM  Result Value Ref Range   WBC 9.1 4.0 - 10.5 K/uL   RBC 3.37 (L) 3.87 - 5.11 MIL/uL   Hemoglobin 10.3 (L) 12.0 - 15.0 g/dL   HCT 68.9 (L) 63.9 - 53.9 %   MCV 92.0 80.0 - 100.0 fL   MCH 30.6 26.0 - 34.0 pg   MCHC 33.2 30.0 - 36.0 g/dL   RDW 82.8 (H) 88.4 - 84.4 %   Platelets 62 (L) 150 - 400 K/uL    Comment: REPEATED TO VERIFY Immature Platelet Fraction may be clinically indicated, consider ordering this additional test [id number]    nRBC 1.5 (H)  0.0 - 0.2 %    Comment: Performed at [facility], [address] [place], [place] [address]  Cooxemetry Panel (carboxy, met, total hgb, O2 sat)     Status: Abnormal   Collection Time: 01/15/24  8:42 AM  Result Value Ref Range   Total hemoglobin 8.0 (L) 12.0 - 16.0 g/dL   O2 Saturation 38.7 %   Carboxyhemoglobin 2.2 (H) 0.5 - 1.5 %   Methemoglobin 0.9 0.0 - 1.5 %    Comment: Performed at [facility], [address] [place], [place] [address]  Glucose, capillary     Status: Abnormal   Collection Time: 01/15/24 11:26 AM  Result Value Ref Range   Glucose-Capillary 142 (H) 70 - 99 mg/dL  Comment: Glucose reference range applies only to samples taken after fasting for at least 8 hours.  Glucose, capillary     Status: Abnormal   Collection Time: 01/15/24  4:40 PM  Result Value Ref Range   Glucose-Capillary 143 (H) 70 - 99 mg/dL    Comment: Glucose reference range applies only to samples taken after fasting for at least 8 hours.  Basic metabolic panel with GFR     Status: Abnormal   Collection Time: 01/15/24  6:14 PM  Result Value Ref Range   Sodium 140 135 - 145 mmol/L   Potassium 3.4 (L) 3.5 - 5.1 mmol/L   Chloride 104 98 - 111 mmol/L   CO2 25 22 - 32 mmol/L   Glucose, Bld 159 (H) 70 - 99 mg/dL    Comment: Glucose reference range applies only to samples taken after fasting for at least 8 hours.   BUN 31 (H) 8 - 23 mg/dL   Creatinine, Ser 8.97 (H) 0.44 - 1.00 mg/dL   Calcium  8.3 (L) 8.9 - 10.3 mg/dL   GFR, Estimated 60 (L) >60 mL/min    Comment: (NOTE) Calculated using the CKD-EPI Creatinine Equation (2021)    Anion gap 11 5 - 15    Comment: Performed at [facility], [address] [place], [place] [address]  Glucose, capillary     Status: Abnormal   Collection Time: 01/15/24  6:24 PM  Result Value Ref Range   Glucose-Capillary 136 (H) 70 - 99 mg/dL    Comment: Glucose reference range applies only to samples taken after fasting for at least 8 hours.   Glucose, capillary     Status: Abnormal   Collection Time: 01/15/24  9:28 PM  Result Value Ref Range   Glucose-Capillary 139 (H) 70 - 99 mg/dL    Comment: Glucose reference range applies only to samples taken after fasting for at least 8 hours.  Cooxemetry Panel (carboxy, met, total hgb, O2 sat)     Status: Abnormal   Collection Time: 01/16/24  4:36 AM  Result Value Ref Range   Total hemoglobin 10.4 (L) 12.0 - 16.0 g/dL   O2 Saturation 30.6 %   Carboxyhemoglobin 1.6 (H) 0.5 - 1.5 %   Methemoglobin <0.7 0.0 - 1.5 %    Comment: Performed at [facility], [address], [place], [place] [address]  Comprehensive metabolic panel     Status: Abnormal   Collection Time: 01/16/24  4:36 AM  Result Value Ref Range   Sodium 143 135 - 145 mmol/L   Potassium 3.7 3.5 - 5.1 mmol/L   Chloride 106 98 - 111 mmol/L   CO2 24 22 - 32 mmol/L   Glucose, Bld 110 (H) 70 - 99 mg/dL    Comment: Glucose reference range applies only to samples taken after fasting for at least 8 hours.   BUN 29 (H) 8 - 23 mg/dL   Creatinine, Ser 9.01 0.44 - 1.00 mg/dL   Calcium  8.2 (L) 8.9 - 10.3 mg/dL   Total Protein 5.4 (L) 6.5 - 8.1 g/dL   Albumin  2.7 (L) 3.5 - 5.0 g/dL   AST 763 (H) 15 - 41 U/L   ALT 275 (H) 0 - 44 U/L   Alkaline Phosphatase 182 (H) 38 - 126 U/L   Total Bilirubin 1.8 (H) 0.0 - 1.2 mg/dL   GFR, Estimated >39 >39 mL/min    Comment: (NOTE) Calculated using the CKD-EPI Creatinine Equation (2021)    Anion gap 13 5 -  15    Comment: Performed at [facility], [address], [place], [place] [address]  Magnesium      Status: None   Collection Time: 01/16/24  4:36 AM  Result Value Ref Range   Magnesium  2.0 1.7 - 2.4 mg/dL    Comment: Performed at [facility], [address], [place], [place] [address]  CBC     Status: Abnormal   Collection Time: 01/16/24  4:36 AM  Result Value Ref Range   WBC 8.6 4.0 - 10.5 K/uL   RBC 3.31 (L) 3.87 - 5.11 MIL/uL   Hemoglobin 10.2 (L) 12.0 - 15.0  g/dL   HCT 68.9 (L) 63.9 - 53.9 %   MCV 93.7 80.0 - 100.0 fL   MCH 30.8 26.0 - 34.0 pg   MCHC 32.9 30.0 - 36.0 g/dL   RDW 83.1 (H) 88.4 - 84.4 %   Platelets 61 (L) 150 - 400 K/uL    Comment: CONSISTENT WITH PREVIOUS RESULT REPEATED TO VERIFY Immature Platelet Fraction may be clinically indicated, consider ordering this additional test [id number]    nRBC 0.8 (H) 0.0 - 0.2 %    Comment: Performed at [facility], [address], [place], [place] [address]  Glucose, capillary     Status: Abnormal   Collection Time: 01/16/24  6:07 AM  Result Value Ref Range   Glucose-Capillary 116 (H) 70 - 99 mg/dL    Comment: Glucose reference range applies only to samples taken after fasting for at least 8 hours.  Glucose, capillary     Status: Abnormal   Collection Time: 01/16/24 12:57 PM  Result Value Ref Range   Glucose-Capillary 127 (H) 70 - 99 mg/dL    Comment: Glucose reference range applies only to samples taken after fasting for at least 8 hours.  Glucose, capillary     Status: Abnormal   Collection Time: 01/16/24  4:00 PM  Result Value Ref Range   Glucose-Capillary 138 (H) 70 - 99 mg/dL    Comment: Glucose reference range applies only to samples taken after fasting for at least 8 hours.  Glucose, capillary     Status: Abnormal   Collection Time: 01/16/24  9:05 PM  Result Value Ref Range   Glucose-Capillary 104 (H) 70 - 99 mg/dL    Comment: Glucose reference range applies only to samples taken after fasting for at least 8 hours.  Cooxemetry Panel (carboxy, met, total hgb, O2 sat)     Status: Abnormal   Collection Time: 01/17/24  4:53 AM  Result Value Ref Range   Total hemoglobin 11.6 (L) 12.0 - 16.0 g/dL   O2 Saturation 55.0 %   Carboxyhemoglobin 1.3 0.5 - 1.5 %   Methemoglobin <0.7 0.0 - 1.5 %    Comment: Performed at [facility], [address] [place], [place] [address]  Comprehensive metabolic panel     Status: Abnormal   Collection Time: 01/17/24  4:53 AM  Result  Value Ref Range   Sodium 137 135 - 145 mmol/L   Potassium 3.9 3.5 - 5.1 mmol/L   Chloride 106 98 - 111 mmol/L   CO2 18 (L) 22 - 32 mmol/L   Glucose, Bld 138 (H) 70 - 99 mg/dL    Comment: Glucose reference range applies only to samples taken after fasting for at least 8 hours.   BUN 30 (H) 8 - 23 mg/dL   Creatinine, Ser 8.99 0.44 - 1.00 mg/dL   Calcium  8.8 (L) 8.9 -  10.3 mg/dL   Total Protein 6.1 (L) 6.5 - 8.1 g/dL   Albumin  2.9 (L) 3.5 - 5.0 g/dL   AST 658 (H) 15 - 41 U/L    Comment: RESULT CONFIRMED BY MANUAL DILUTION   ALT 337 (H) 0 - 44 U/L   Alkaline Phosphatase 212 (H) 38 - 126 U/L   Total Bilirubin 2.1 (H) 0.0 - 1.2 mg/dL   GFR, Estimated >39 >39 mL/min    Comment: (NOTE) Calculated using the CKD-EPI Creatinine Equation (2021)    Anion gap 13 5 - 15    Comment: Performed at [facility], [address] [place], [place] [address]  Magnesium      Status: None   Collection Time: 01/17/24  4:53 AM  Result Value Ref Range   Magnesium  1.9 1.7 - 2.4 mg/dL    Comment: Performed at [facility], [address] [place], [place] [address]  CBC     Status: Abnormal   Collection Time: 01/17/24  4:53 AM  Result Value Ref Range   WBC 14.3 (H) 4.0 - 10.5 K/uL   RBC 3.66 (L) 3.87 - 5.11 MIL/uL   Hemoglobin 11.2 (L) 12.0 - 15.0 g/dL   HCT 65.5 (L) 63.9 - 53.9 %   MCV 94.0 80.0 - 100.0 fL   MCH 30.6 26.0 - 34.0 pg   MCHC 32.6 30.0 - 36.0 g/dL   RDW 81.3 (H) 88.4 - 84.4 %   Platelets 120 (L) 150 - 400 K/uL   nRBC 0.8 (H) 0.0 - 0.2 %    Comment: Performed at [facility], [address], [place], [place] [address]  Glucose, capillary     Status: Abnormal   Collection Time: 01/17/24  6:49 AM  Result Value Ref Range   Glucose-Capillary 118 (H) 70 - 99 mg/dL    Comment: Glucose reference range applies only to samples taken after fasting for at least 8 hours.  Cooxemetry Panel (carboxy, met, total hgb, O2 sat)     Status: Abnormal   Collection Time: 01/17/24  7:56 AM   Result Value Ref Range   Total hemoglobin 10.7 (L) 12.0 - 16.0 g/dL   O2 Saturation 60.5 %   Carboxyhemoglobin 1.5 0.5 - 1.5 %   Methemoglobin <0.7 0.0 - 1.5 %    Comment: Performed at [facility], [address] [place], [place] [address]  CG4 I-STAT (Lactic acid)     Status: Abnormal   Collection Time: 01/17/24  8:03 AM  Result Value Ref Range   Lactic Acid, Venous 2.4 (HH) 0.5 - 1.9 mmol/L   Comment NOTIFIED PHYSICIAN   Procalcitonin     Status: None   Collection Time: 01/17/24  8:13 AM  Result Value Ref Range   Procalcitonin 0.23 ng/mL    Comment:        Interpretation: PCT (Procalcitonin) <= 0.5 ng/mL: Systemic infection (sepsis) is not likely. Local bacterial infection is possible. (NOTE)       Sepsis PCT Algorithm           Lower Respiratory Tract                                      Infection PCT Algorithm    ----------------------------     ----------------------------         PCT < 0.25 ng/mL  PCT < 0.10 ng/mL          Strongly encourage             Strongly discourage   discontinuation of antibiotics    initiation of antibiotics    ----------------------------     -----------------------------       PCT 0.25 - 0.50 ng/mL            PCT 0.10 - 0.25 ng/mL               OR       >80% decrease in PCT            Discourage initiation of                                            antibiotics      Encourage discontinuation           of antibiotics    ----------------------------     -----------------------------         PCT >= 0.50 ng/mL              PCT 0.26 - 0.50 ng/mL               AND        <80% decrease in PCT             Encourage initiation of                                             antibiotics       Encourage continuation           of antibiotics    ----------------------------     -----------------------------        PCT >= 0.50 ng/mL                  PCT > 0.50 ng/mL               AND         increase in PCT                   Strongly encourage                                      initiation of antibiotics    Strongly encourage escalation           of antibiotics                                     -----------------------------                                           PCT <= 0.25 ng/mL                                                 OR                                        >  80% decrease in PCT                                      Discontinue / Do not initiate                                             antibiotics  Performed at [facility], [address], [place], [place] [address]   Glucose, capillary     Status: Abnormal   Collection Time: 01/17/24 11:39 AM  Result Value Ref Range   Glucose-Capillary 240 (H) 70 - 99 mg/dL    Comment: Glucose reference range applies only to samples taken after fasting for at least 8 hours.  Cooxemetry Panel (carboxy, met, total hgb, O2 sat)     Status: Abnormal   Collection Time: 01/17/24 12:12 PM  Result Value Ref Range   Total hemoglobin 11.9 (L) 12.0 - 16.0 g/dL   O2 Saturation 47.1 %   Carboxyhemoglobin 1.7 (H) 0.5 - 1.5 %   Methemoglobin 1.1 0.0 - 1.5 %    Comment: Performed at [facility], [address] [place], [place] [address]  Urinalysis, Routine w reflex microscopic -Urine, Clean Catch     Status: Abnormal   Collection Time: 01/17/24  1:37 PM  Result Value Ref Range   Color, Urine STRAW (A) YELLOW   APPearance CLEAR CLEAR   Specific Gravity, Urine 1.005 1.005 - 1.030   pH 5.0 5.0 - 8.0   Glucose, UA NEGATIVE NEGATIVE mg/dL   Hgb urine dipstick MODERATE (A) NEGATIVE   Bilirubin Urine NEGATIVE NEGATIVE   Ketones, ur NEGATIVE NEGATIVE mg/dL   Protein, ur NEGATIVE NEGATIVE mg/dL   Nitrite NEGATIVE NEGATIVE   Leukocytes,Ua NEGATIVE NEGATIVE   RBC / HPF 0-5 0 - 5 RBC/hpf   WBC, UA 0-5 0 - 5 WBC/hpf   Bacteria, UA NONE SEEN NONE SEEN   Squamous Epithelial / HPF 0-5 0 - 5 /HPF    Comment: Performed at [facility], [address], [place], [place] [address]  MRSA culture     Status: None   Collection Time: 01/17/24  2:18 PM   Specimen: Nasal; Body Fluid  Result Value Ref Range   Specimen Description NASAL SWAB    Special Requests NONE    Culture      NO MRSA DETECTED Performed at [facility], [address], [place], [place] [address]    Report Status 01/18/2024 FINAL   Basic metabolic panel     Status: Abnormal   Collection Time: 01/17/24  2:47 PM  Result Value Ref Range   Sodium 137 135 - 145 mmol/L   Potassium 3.8 3.5 - 5.1 mmol/L   Chloride 100 98 - 111 mmol/L   CO2 21 (L) 22 - 32 mmol/L   Glucose, Bld 239 (H) 70 - 99 mg/dL    Comment: Glucose reference range applies only to samples taken after fasting for at least 8 hours.   BUN 27 (H) 8 - 23 mg/dL   Creatinine, Ser 9.01 0.44 - 1.00 mg/dL   Calcium  8.3 (L) 8.9 - 10.3 mg/dL   GFR, Estimated >39 >39 mL/min    Comment: (NOTE) Calculated using the CKD-EPI Creatinine Equation (2021)    Anion gap 16 (H)  5 - 15    Comment: Performed at [facility], [address] [place], [place] [address]  ECHOCARDIOGRAM LIMITED     Status: None   Collection Time: 01/17/24  3:29 PM  Result Value Ref Range   Weight 2,684.32 oz   Height 64 in   BP 142/85 mmHg   S' Lateral 2.80 cm   Area-P 1/2 4.21 cm2   Est EF 60 - 65%   Glucose, capillary     Status: Abnormal   Collection Time: 01/17/24  4:35 PM  Result Value Ref Range   Glucose-Capillary 145 (H) 70 - 99 mg/dL    Comment: Glucose reference range applies only to samples taken after fasting for at least 8 hours.  Glucose, capillary     Status: Abnormal   Collection Time: 01/17/24  9:05 PM  Result Value Ref Range   Glucose-Capillary 158 (H) 70 - 99 mg/dL    Comment: Glucose reference range applies only to samples taken after fasting for at least 8 hours.  Cooxemetry Panel (carboxy, met, total hgb, O2 sat)     Status: Abnormal   Collection Time: 01/18/24  4:54 AM  Result Value Ref Range   Total  hemoglobin 12.1 12.0 - 16.0 g/dL   O2 Saturation 37.6 %   Carboxyhemoglobin 2.6 (H) 0.5 - 1.5 %   Methemoglobin 0.8 0.0 - 1.5 %    Comment: Performed at [facility], [address], [place], [place] [address]  Magnesium      Status: Abnormal   Collection Time: 01/18/24  4:54 AM  Result Value Ref Range   Magnesium  1.5 (L) 1.7 - 2.4 mg/dL    Comment: Performed at [facility], [address], [place], [place] [address]  CBC     Status: Abnormal   Collection Time: 01/18/24  4:54 AM  Result Value Ref Range   WBC 11.5 (H) 4.0 - 10.5 K/uL   RBC 3.80 (L) 3.87 - 5.11 MIL/uL   Hemoglobin 11.9 (L) 12.0 - 15.0 g/dL   HCT 64.8 (L) 63.9 - 53.9 %   MCV 92.4 80.0 - 100.0 fL   MCH 31.3 26.0 - 34.0 pg   MCHC 33.9 30.0 - 36.0 g/dL   RDW 80.4 (H) 88.4 - 84.4 %   Platelets 133 (L) 150 - 400 K/uL   nRBC 0.5 (H) 0.0 - 0.2 %    Comment: Performed at [facility], [address] [place], [place] [address]   *Note: Due to a large number of results and/or encounters for the requested time period, some results have not been displayed. A complete set of results can be found in Results Review.      ## Disposition:-- There are no psychiatric contraindications to discharge at this time  ## Behavioral / Environmental: - No specific recommendations at this time.     ## Safety and Observation Level:  - Based on my clinical evaluation, I estimate the patient to be at minimal risk of self harm in the current setting. - At this time, we recommend  routine. This decision is based on my review of the chart including patient's history and current presentation, interview of the patient, mental status examination, and consideration of suicide risk including evaluating suicidal ideation, plan, intent, suicidal or self-harm behaviors, risk factors, and protective factors. This judgment is based on our ability to directly address suicide risk, implement suicide prevention strategies, and develop a safety  plan while the patient is in  the clinical setting. Please contact our team if there is a concern that risk level has changed.  CSSR Risk Category:C-SSRS RISK CATEGORY: No Risk  Suicide Risk Assessment: Patient has following modifiable risk factors for suicide: under treated depression , which we are addressing by providing outpatient resources. Patient has following non-modifiable or demographic risk factors for suicide: NA Patient has the following protective factors against suicide: Access to outpatient mental health care, Supportive family, Frustration tolerance, and no history of suicide attempts  Thank you for this consult request. Recommendations have been communicated to the primary team.  We will sign off at this time.   Porfirio LITTIE Glatter, DO       History of Present Illness  Patient Report:  On initial examination, patient was seen laying in bed on my approach this morning. She reports that she has been in and out of the hospital for months due to a kidney stone, a UTI, and Heart issues. She believes that psychiatry was consulted because her daughter may have reported that the patient is experiencing high anxiety. She admits that she has been more anxious and frustrated due to being in the hospital but she is motivated to get better and looking forward to returning home. She does admit to some concern about the weather, ice, and how this may impact her ability to move around. We discussed her mental health treatment and the patient reported interest in resources for outpatient behavioral health treatment. She denies any SI/HI/AVH.  Psych ROS:  Depression: Yes Anxiety:  Yes Mania (lifetime and current): No Psychosis: (lifetime and current): No  ROS   Psychiatric and Social History  Psychiatric History:  Information collected from Patient  Prev Dx/Sx: Depression and Anxiety Current Psych Provider: None Home Meds (current): Zoloft  25 mg daily Previous Med Trials: Zoloft  and  Klonopin  Therapy: None currently  Prior Psych Hospitalization: Denies  Prior Self Harm: Denies Prior Violence: Denies  Family Psych History: Denies Family Hx suicide: Denies  Social History:  Developmental Hx: Unremarkable Educational Hx: GED and scientist, product/process development 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, Community Hx: Denies Living Situation: Home alone Spiritual Hx: Non practicing Access to weapons/lethal means: Denies   Substance History Alcohol: Denies  Type of alcohol NA Last Drink NA Number of drinks per day NA History of alcohol withdrawal seizures NA History of DT's NA Tobacco: Regular use Illicit drugs: Denies Prescription drug abuse: Denies Rehab hx: NA  Exam Findings  Physical Exam:  Vital Signs:  Temp:  [98.1 F (36.7 C)-98.5 F (36.9 C)] 98.2 F (36.8 C) (01/27 0620) Pulse Rate:  [62-76] 76 (01/27 0620) Resp:  [17-18] 18 (01/27 0620) BP: (116-138)/(58-68) 124/68 (01/27 0620) SpO2:  [96 %-98 %] 97 % (01/27 0620) Weight:  [67 kg] 67 kg (01/27 0621) Blood pressure 124/68, pulse 76, temperature 98.2 F (36.8 C), resp. rate 18, height 5' 4 (1.626 m), weight 67 kg, SpO2 97%. Body mass index is 25.35 kg/m.  Physical Exam  Mental Status Exam: General Appearance: Well Groomed  Orientation:  Full (Time, Place, and Person)  Memory:  Immediate;   Good Recent;   Good Remote;   Good  Concentration:  Concentration: Good  Recall:  Good  Attention  Good  Eye Contact:  Good  Speech:  Clear and Coherent  Language:  Good  Volume:  Normal  Mood: Worried  Affect:  Full, reactive   Thought Process:  Coherent  Thought Content:  Logical  Suicidal Thoughts:  No  Homicidal Thoughts:  No  Judgement:  Good  Insight:  Good  Psychomotor Activity:  Normal  Akathisia:  NA  Fund of Knowledge:  Good      Assets:  Communication Skills Desire for Improvement Housing  Cognition:  WNL  ADL's:  Intact  AIMS (if indicated):        Other History    These have been pulled in through the EMR, reviewed, and updated if appropriate.  Family History:  The patient's family history includes Diabetes in her father; Hypertension in her mother.  Medical History: Past Medical History:  Diagnosis Date   Anxiety    Candidemia (HCC) 12/2023   Nephrolithiasis    lithotripsy 12/14/23 w/stent   Pyelonephritis 12/2023   Thyroid  disease    Vertigo     Surgical History: Past Surgical History:  Procedure Laterality Date   APPENDECTOMY     AUGMENTATION MAMMAPLASTY Bilateral 1981   removed in 2006   BREAST IMPLANT REMOVAL Bilateral 2007   CARDIOVERSION N/A 01/22/2024   Procedure: CARDIOVERSION;  Surgeon: Rolan Ezra RAMAN, MD;  Location: [facility] INVASIVE CV LAB;  Service: Cardiovascular;  Laterality: N/A;   CHOLECYSTECTOMY  2010   COLONOSCOPY WITH PROPOFOL  N/A 06/30/2021   Procedure: COLONOSCOPY WITH PROPOFOL ;  Surgeon: Jinny Carmine, MD;  Location: [facility] ENDOSCOPY;  Service: Endoscopy;  Laterality: N/A;   CONTROL OF EPISTAXIS  02/04/2024   ECMO CANNULATION N/A 02/09/2024   Procedure: ECMO CANNULATION;  Surgeon: Zenaida Morene PARAS, MD;  Location: [facility] INVASIVE CV LAB;  Service: Cardiovascular;  Laterality: N/A;   INTRAOPERATIVE TRANSESOPHAGEAL ECHOCARDIOGRAM N/A 01/10/2024   Procedure: ECHOCARDIOGRAM, TRANSESOPHAGEAL, INTRAOPERATIVE;  Surgeon: Kerrin Elspeth BROCKS, MD;  Location: [facility] OR;  Service: Open Heart Surgery;  Laterality: N/A;   INTRAOPERATIVE TRANSESOPHAGEAL ECHOCARDIOGRAM N/A 02/08/2024   Procedure: ECHOCARDIOGRAM, TRANSESOPHAGEAL, INTRAOPERATIVE;  Surgeon: Kerrin Elspeth BROCKS, MD;  Location: [facility] OR;  Service: Open Heart Surgery;  Laterality: N/A;   IR TUNNELED CENTRAL VENOUS CATH PLC W IMG  02/25/2024   LAPAROSCOPIC TOTAL HYSTERECTOMY  2006   MITRAL VALVE REPAIR N/A 01/10/2024   Procedure: REPAIR, MITRAL VALVE;  Surgeon: Kerrin Elspeth BROCKS, MD;  Location: [facility] OR;  Service: Open Heart Surgery;  Laterality: N/A;   MITRAL VALVE REPLACEMENT N/A  02/08/2024   Procedure: REPLACEMENT, MITRAL VALVE WITH MITRIS RESILIA MITRAL VALVE 27mm;  Surgeon: Kerrin Elspeth BROCKS, MD;  Location: [facility] OR;  Service: Open Heart Surgery;  Laterality: N/A;   REDO STERNOTOMY N/A 02/08/2024   Procedure: REDO STERNOTOMY;  Surgeon: Kerrin Elspeth BROCKS, MD;  Location: [facility] OR;  Service: Open Heart Surgery;  Laterality: N/A;   RIGHT HEART CATH N/A 02/05/2024   Procedure: RIGHT HEART CATH;  Surgeon: Zenaida Morene PARAS, MD;  Location: [facility] INVASIVE CV LAB;  Service: Cardiovascular;  Laterality: N/A;   RIGHT/LEFT HEART CATH AND CORONARY ANGIOGRAPHY N/A 01/04/2024   Procedure: RIGHT/LEFT HEART CATH AND CORONARY ANGIOGRAPHY;  Surgeon: Verlin Lonni BIRCH, MD;  Location: [facility] INVASIVE CV LAB;  Service: Cardiovascular;  Laterality: N/A;   TOOTH EXTRACTION N/A 01/07/2024   Procedure: DENTAL RESTORATION/EXTRACTIONS;  Surgeon: Sheryle Hamilton, DMD;  Location: [facility] OR;  Service: Oral Surgery;  Laterality: N/A;  EXTRACTION OF TEETH #2, #6, #7, #16, #29   TRANSESOPHAGEAL ECHOCARDIOGRAM (CATH LAB) N/A 01/01/2024   Procedure: TRANSESOPHAGEAL ECHOCARDIOGRAM;  Surgeon: Lonni Slain, MD;  Location: [facility] INVASIVE CV LAB;  Service: Cardiovascular;  Laterality: N/A;   TRANSESOPHAGEAL ECHOCARDIOGRAM (CATH LAB) N/A 01/22/2024   Procedure: TRANSESOPHAGEAL ECHOCARDIOGRAM;  Surgeon: Rolan Ezra RAMAN, MD;  Location: [facility] INVASIVE CV LAB;  Service: Cardiovascular;  Laterality: N/A;     Medications:  Current Medications[1]  Allergies: Allergies[2]  Porfirio LITTIE Glatter, DO     [1]  Current Facility-Administered Medications:    acetaminophen  (TYLENOL ) tablet 325-650 mg, 325-650 mg, Oral, Q4H PRN, Love, Pamela S, PA-C, 650 mg at 03/14/24 1129   albuterol  (PROVENTIL ) (2.5 MG/3ML) 0.083% nebulizer solution 2.5 mg, 2.5 mg, Nebulization, Q4H PRN, Love, Pamela S, PA-C   amiodarone  (PACERONE ) tablet 200 mg, 200 mg, Oral, Daily, Love, Pamela S, PA-C, 200 mg at 04/01/24 0831   antiseptic oral  rinse (BIOTENE) solution 15 mL, 15 mL, Mouth Rinse, PRN, Love, Pamela S, PA-C   apixaban  (ELIQUIS ) tablet 5 mg, 5 mg, Oral, BID, Love, Pamela S, PA-C, 5 mg at 04/01/24 0708   ascorbic acid  (VITAMIN C ) tablet 500 mg, 500 mg, Oral, Q supper, Love, Pamela S, PA-C, 500 mg at 03/31/24 1726   cholecalciferol  (VITAMIN D3) 25 MCG (1000 UNIT) tablet 1,000 Units, 1,000 Units, Oral, Daily, Raulkar, Sven SQUIBB, MD, 1,000 Units at 04/01/24 0708   dextromethorphan  (DELSYM ) 30 MG/5ML liquid 15 mg, 15 mg, Oral, QID PRN, Love, Pamela S, PA-C   fluconazole  (DIFLUCAN ) tablet 400 mg, 400 mg, Oral, Q supper, 400 mg at 03/31/24 1726 **FOLLOWED BY** [START ON 05/05/2024] fluconazole  (DIFLUCAN ) tablet 200 mg, 200 mg, Oral, Daily, Glatter Linsey I, RPH   Gerhardt's butt cream, , Topical, BID, Love, Pamela S, PA-C, Given at 04/01/24 0709   guaiFENesin  (MUCINEX ) 12 hr tablet 600 mg, 600 mg, Oral, BID PRN, Raulkar, Krutika P, MD   hydrocerin (EUCERIN) cream, , Topical, BID, Love, Pamela S, PA-C, Given at 04/01/24 0709   hydrocortisone  cream 1 % 1 Application, 1 Application, Topical, BID, Emeline Joesph BROCKS, DO, 1 Application at 04/01/24 0831   lidocaine  (XYLOCAINE ) 2 % jelly, , Topical, PRN, Love, Pamela S, PA-C   magic mouthwash, 5 mL, Oral, TID PRN, Love, Pamela S, PA-C   melatonin tablet 5 mg, 5 mg, Oral, QHS, Engler, Morgan C, DO, 5 mg at 03/31/24 2219   midodrine  (PROAMATINE ) tablet 5 mg, 5 mg, Oral, TID WC, Raulkar, Krutika P, MD, 5 mg at 04/01/24 0708   Oral care mouth rinse, 15 mL, Mouth Rinse, 4 times per day, Love, Pamela S, PA-C, 15 mL at 04/01/24 0708   Oral care mouth rinse, 15 mL, Mouth Rinse, PRN, Raulkar, Sven SQUIBB, MD   pantoprazole  (PROTONIX ) EC tablet 40 mg, 40 mg, Oral, Daily, Raulkar, Krutika P, MD, 40 mg at 04/01/24 0708   polyethylene glycol (MIRALAX  / GLYCOLAX ) packet 17 g, 17 g, Oral, Daily PRN, Hammons, Suzen NOVAK, RPH   promethazine  (PHENERGAN ) tablet 12.5 mg, 12.5 mg, Oral, Q6H PRN, 12.5 mg at  03/29/24 0902 **OR** promethazine  (PHENERGAN ) suppository 12.5 mg, 12.5 mg, Rectal, Q6H PRN **OR** promethazine  (PHENERGAN ) injection 12.5 mg, 12.5 mg, Intramuscular, Q6H PRN, Love, Pamela S, PA-C   sertraline  (ZOLOFT ) tablet 25 mg, 25 mg, Oral, QHS, Love, Pamela S, PA-C, 25 mg at 03/31/24 2219   simethicone  (MYLICON) chewable tablet 80 mg, 80 mg, Oral, QID PRN, Love, Pamela S, PA-C, 80 mg at 03/30/24 1224   sorbitol  70 % solution 30 mL, 30 mL, Oral, BID PRN, Love, Pamela S, PA-C   witch hazel-glycerin  (TUCKS) pad 1 Application, 1 Application, Topical, PRN, Raulkar, Krutika P, MD [2]  Allergies Allergen Reactions   Codeine Anaphylaxis and Nausea And Vomiting   Bactoshield Chg [Chlorhexidine  Gluconate] Dermatitis   Chlorhexidine  Dermatitis and Rash  Red Dye #40 (Allura Red) Nausea Only

## 2024-04-02 ENCOUNTER — Other Ambulatory Visit (HOSPITAL_COMMUNITY): Payer: Self-pay

## 2024-04-02 ENCOUNTER — Ambulatory Visit: Admitting: Emergency Medicine

## 2024-04-02 DIAGNOSIS — R5381 Other malaise: Secondary | ICD-10-CM | POA: Diagnosis not present

## 2024-04-02 MED ORDER — MIDODRINE HCL 5 MG PO TABS
5.0000 mg | ORAL_TABLET | Freq: Three times a day (TID) | ORAL | 0 refills | Status: AC
  Filled 2024-04-02: qty 90, 30d supply, fill #0

## 2024-04-02 MED ORDER — DARBEPOETIN ALFA 60 MCG/0.3ML IJ SOSY
60.0000 ug | PREFILLED_SYRINGE | INTRAMUSCULAR | Status: DC
  Administered 2024-04-02: 60 ug via SUBCUTANEOUS
  Filled 2024-04-02: qty 0.3

## 2024-04-02 MED ORDER — GUAIFENESIN ER 600 MG PO TB12
600.0000 mg | ORAL_TABLET | Freq: Two times a day (BID) | ORAL | 0 refills | Status: AC | PRN
  Filled 2024-04-02: qty 10, 5d supply, fill #0

## 2024-04-02 MED ORDER — HYDROCORTISONE 1 % EX CREA
1.0000 | TOPICAL_CREAM | Freq: Two times a day (BID) | CUTANEOUS | 0 refills | Status: AC
  Filled 2024-04-02: qty 30, 10d supply, fill #0

## 2024-04-02 MED ORDER — APIXABAN 5 MG PO TABS
5.0000 mg | ORAL_TABLET | Freq: Two times a day (BID) | ORAL | 0 refills | Status: AC
  Filled 2024-04-02: qty 60, 30d supply, fill #0

## 2024-04-02 MED ORDER — FLUCONAZOLE 200 MG PO TABS
400.0000 mg | ORAL_TABLET | Freq: Every day | ORAL | 0 refills | Status: AC
  Filled 2024-04-02: qty 68, 34d supply, fill #0

## 2024-04-02 MED ORDER — ASCORBIC ACID 500 MG PO TABS
500.0000 mg | ORAL_TABLET | Freq: Every day | ORAL | 0 refills | Status: AC
  Filled 2024-04-02: qty 30, 30d supply, fill #0

## 2024-04-02 MED ORDER — BIOTENE DRY MOUTH MT LIQD: 15.0000 mL | OROMUCOSAL | Status: AC | PRN

## 2024-04-02 MED ORDER — SERTRALINE HCL 25 MG PO TABS
25.0000 mg | ORAL_TABLET | Freq: Every day | ORAL | 0 refills | Status: AC
  Filled 2024-04-02: qty 30, 30d supply, fill #0

## 2024-04-02 MED ORDER — PANTOPRAZOLE SODIUM 40 MG PO TBEC
40.0000 mg | DELAYED_RELEASE_TABLET | Freq: Every day | ORAL | 0 refills | Status: AC
  Filled 2024-04-02: qty 30, 30d supply, fill #0

## 2024-04-02 MED ORDER — WITCH HAZEL-GLYCERIN EX PADS: 1.0000 | MEDICATED_PAD | CUTANEOUS | Status: AC | PRN

## 2024-04-02 MED ORDER — AMIODARONE HCL 200 MG PO TABS
200.0000 mg | ORAL_TABLET | Freq: Every day | ORAL | 0 refills | Status: AC
  Filled 2024-04-02: qty 30, 30d supply, fill #0

## 2024-04-02 MED ORDER — GERHARDT'S BUTT CREAM: 1.0000 | TOPICAL_CREAM | Freq: Two times a day (BID) | CUTANEOUS | Status: AC

## 2024-04-02 MED ORDER — VITAMIN D3 25 MCG PO TABS
1000.0000 [IU] | ORAL_TABLET | Freq: Every day | ORAL | 0 refills | Status: AC
  Filled 2024-04-02: qty 30, 30d supply, fill #0

## 2024-04-02 MED ORDER — HYDROCERIN EX CREA: 1.0000 | TOPICAL_CREAM | Freq: Two times a day (BID) | CUTANEOUS | Status: AC

## 2024-04-02 MED ORDER — MELATONIN 5 MG PO TABS
5.0000 mg | ORAL_TABLET | Freq: Every day | ORAL | 0 refills | Status: AC
  Filled 2024-04-02: qty 30, 30d supply, fill #0

## 2024-04-02 NOTE — Discharge Planning (Signed)
 [facility]  Initial Hemodialysis Orders  Dialysis center: [facility]  Patient's name: Tracy Baker DOB: [birth year] AKI or ESRD: AKI  Discharge diagnosis: AKI on HD 2. Cam[place] endocarditis s/p mitral valve replacement  Allergies: codeine, chlorhexidine , red dye #40  Date of First Dialysis: 02/15/25 Cause of renal disease: ATN  Dialysis Prescription: Dialysis Frequency: TIW Tx duration: 4 hours BFR: 400 DFR: 800 EDW: 67.5kg  Dialyzer: 180NRe UF profile/Sodium modeling?: no Dialysis Bath: 2 K 2 Ca  Dialysis access: Access type: L IJ TDC Date placed: 02/24/25  Surgeon: [name] IR Needle gauge: N/A  In Center Medications: Heparin  Dose: 2000 unit bolus  Type: pork VDRA: none- per protocol Venofer: none Mircera: 100mcg IV q 2 weeks Next dose due: 04/09/24 Sensipar: none  Discharge labs: Hgb: 9.4 K+: 3.6  Ca: 9.6  Phos: 4.5 Alb: 3.8  Please draw routine labs. Additional labs needed: weekly RFP Additional notes/follow-up: none  Lucie Collet, PA-C 04/02/2024, 12:59 PM  [facility] Pager: [phone]

## 2024-04-02 NOTE — Progress Notes (Addendum)
 "                                                        PROGRESS NOTE   Subjective/Complaints: No new complaints this morning She continues to be anxious about discharge understandably, but glad that she gets to work with therapy today  ROS:  Pt denies fever, chills, SOB, abd pain, CP, N/V/C/D, and vision changes +fatigue-improved  denies dysuria   Objective:   No results found.  Recent Labs    04/01/24 1213  WBC 4.7  HGB 9.4*  HCT 28.4*  PLT 168     Recent Labs    04/01/24 1213  NA 139  K 3.6  CL 99  CO2 25  GLUCOSE 95  BUN 57*  CREATININE 3.67*  CALCIUM  9.6     Intake/Output Summary (Last 24 hours) at 04/02/2024 0914 Last data filed at 04/01/2024 1800 Gross per 24 hour  Intake 480 ml  Output 1500 ml  Net -1020 ml       Wound 03/15/24 1507 Pressure Injury Foot Left;Posterior Deep Tissue Pressure Injury - Purple or maroon localized area of discolored intact skin or blood-filled blister due to damage of underlying soft tissue from pressure and/or shear. (Active)    Physical Exam: Vital Signs Blood pressure 118/77, pulse 80, temperature 97.8 F (36.6 C), resp. rate 18, height 5' 4 (1.626 m), weight 67.6 kg, SpO2 96%.  General: awake, alert, appropriate, NAD, laying in bed appears comfortable; vitals reviewed HENT: conjugate gaze; oropharynx moist CV: RRR Pulmonary: CTA B/L; nonlabored GI: soft, NT, ND, (+)BS normoactive Extr: No LE edema. Psychiatric: appropriate, cooperative Skin: has chest wall incision, tunneled catheter on left chest wall, right buttock wound and bilateral heel wounds--> left heel now with quarter sized stage II--covered +rash on chest, erythematous    Neuro: Alert and oriented ,4-/5 strength throughout, good attention, sensation is intact, +bilateral hand tremors, stable 1/28    Assessment/Plan: 1. Functional deficits which require 3+ hours per day of interdisciplinary therapy in a comprehensive inpatient rehab  setting. Physiatrist is providing close team supervision and 24 hour management of active medical problems listed below. Physiatrist and rehab team continue to assess barriers to discharge/monitor patient progress toward functional and medical goals  Care Tool:  Bathing    Body parts bathed by patient: Right arm, Left arm, Chest, Abdomen, Front perineal area, Buttocks, Right upper leg, Left upper leg, Right lower leg, Left lower leg, Face   Body parts bathed by helper: Buttocks, Right lower leg, Left lower leg     Bathing assist Assist Level: Supervision/Verbal cueing     Upper Body Dressing/Undressing Upper body dressing   What is the patient wearing?: Pull over shirt    Upper body assist Assist Level: Supervision/Verbal cueing    Lower Body Dressing/Undressing Lower body dressing      What is the patient wearing?: Underwear/pull up, Pants     Lower body assist Assist for lower body dressing: Supervision/Verbal cueing     Toileting Toileting    Toileting assist Assist for toileting: Set up assist     Transfers Chair/bed transfer  Transfers assist  Chair/bed transfer activity did not occur: Safety/medical concerns (pain, fatigue, dizziness)  Chair/bed transfer assist level: Independent with assistive device Chair/bed transfer assistive device: Other (rollator)   Locomotion Ambulation  Ambulation assist   Ambulation activity did not occur: Safety/medical concerns (pain, fatigue, dizziness)  Assist level: Supervision/Verbal cueing Assistive device: Rollator Max distance: 145ft   Walk 10 feet activity   Assist  Walk 10 feet activity did not occur: Safety/medical concerns (pain, fatigue, dizziness)  Assist level: Supervision/Verbal cueing Assistive device: Rollator   Walk 50 feet activity   Assist Walk 50 feet with 2 turns activity did not occur: Safety/medical concerns (pain, fatigue, dizziness)  Assist level: Supervision/Verbal cueing Assistive  device: Rollator    Walk 150 feet activity   Assist Walk 150 feet activity did not occur: Safety/medical concerns (fatigue)  Assist level: Supervision/Verbal cueing Assistive device: Rollator    Walk 10 feet on uneven surface  activity   Assist Walk 10 feet on uneven surfaces activity did not occur: Safety/medical concerns (pain, fatigue, dizziness)   Assist level: Contact Guard/Touching assist Assistive device: Rollator   Wheelchair     Assist Is the patient using a wheelchair?: No Type of Wheelchair: Manual Wheelchair activity did not occur: N/A         Wheelchair 50 feet with 2 turns activity    Assist    Wheelchair 50 feet with 2 turns activity did not occur: N/A       Wheelchair 150 feet activity     Assist  Wheelchair 150 feet activity did not occur: N/A       Blood pressure 118/77, pulse 80, temperature 97.8 F (36.6 C), resp. rate 18, height 5' 4 (1.626 m), weight 67.6 kg, SpO2 96%.  Medical Problem List and Plan: 1. Functional deficits secondary to debility after prolonged hospital stay related to mitral valve vegetations and MV repair and ultimate replacement.             -patient may  shower             -ELOS/Goals: 15 days, supervision goals with PT, OT, SLP  D/c home today Continue Eliquis  Full code but no chest compressions  2. Hypotension: medications reviewed and is on amiodarone , d/c clonazepam , continue compression garments, midodrine  decreased to 5mg  since hypotension has improved and patient is complaining of dry mouth, continue abdominal binder, discussed with patient and son that this is likely to be worst post-dialysis  3. Pain: A) Bilateral heel pain: d/ced Lyrica  because this pain improves B) Right ankle pain: continue ASO, placed nursing for heat application in the morning for stiffness and ice in the afternoon to reduce inflammation. XR reviewed and is stable  4. Insomnia: d/c remeron , continue scheduled  melatonin  5. Neuropsych/cognition: This patient is capable of making decisions on her own behalf.  6. Skin/Wound Care: Air mattress overlay for pressure relief.             --continue Ensure and prosource             --added Vitamin C  and Zinc to promote wound healing.   1/11 left heel appears to be evolution of what was present on admit   -continue above, prevalons   -discussed with pt importance of pressure relief, nutrition  Sarna and hydrocortisone  ordered for rash on chest; discussed that this has improved  7. Decreased appetite: resolved, d/c megace , d/c remeron   8.Fungal endocarditis: continue oral fluconazole  indefinitely as per ID, discussed with patient and family  9. Mitral valve replacement with redo: has completed sternal precautions  10. AKI with dialysis initiated 02/16/24: On HD MWF at the end of the day to help with tolerance of therapy  Cr reviewed and discussed level with patient             --CLIP on hold. Needs to be able to sit up in a chair for HD.    11. A fib: Monitor HR TID--continue amiodarone  and eliquis .   12. Anemia of critical illness: continue to monitor Hgb with HD labs   13.Enterobacter cloaca UTI: completed 3 day course of Bactrim   14. Daytime fatigue:  continue D3 daily Asked nephrology whether renal function panel can be discontinued so she does not need to be waken daily at 5am, daily weights also changed to 8am Discussed that this will likely continue to be worst following dialysis days B12 ordered (one time injection given red dye allergy), d/c seroquel , change lyrica  to HS, decreased therapy to 15/7, d/c clonazepam  at night, discussed that remeron  could contribute but will maintain for now given insomnia and to help stimulate appetite, melatonin scheduled at night to help with insomnia  15. Hyponatremia: Na level reviewed and this improved, continue to monitor Na levels with HD  16. Bilateral hand tremors: liver enzymes reviewed  and are wnl, discussed that these seem to worsen when patient is anxious  17. Nausea: improved  18. Anxiety: psychology consult placed as per daughter's request   >30 minutes spent in discharge of patient including review of medications and follow-up appointments, physical examination, and in answering all patient's questions    LOS: 23 days A FACE TO FACE EVALUATION WAS PERFORMED  Sven SQUIBB Tracy Baker 04/02/2024, 9:14 AM     "

## 2024-04-02 NOTE — Progress Notes (Signed)
 Patient ID: Tracy Baker, female   DOB: [birth year], 69 y.o.   MRN: [id number]  Spoke to patient's daughter, Tracy Baker who reports that she is picking her mother up for discharge today. Medical team updated.   Patient has all DME in the room: Rollator and 3-1 commode.   [facility] provided with [name]'s address for [facility] PT/OT - the address is [address] [place] [address].  No other needs at present.

## 2024-04-02 NOTE — Progress Notes (Signed)
 Inpatient Rehabilitation Care Coordinator Discharge Note   Patient Details  Name: Tracy Baker: [id number] Date of Birth: 1955/06/16   Discharge location: Home with daughter Tracy Baker: 22 days  Discharge activity level: Supervision/Mod-I  Home/community participation: Limited activity in the community  Patient response un:Yzjouy Literacy - How often do you need to have someone help you when you read instructions, pamphlets, or other written material from your doctor or pharmacy?: Never  Patient response un:Dnrpjo Isolation - How often do you feel lonely or isolated from those around you?: Never  Services provided included: MD, RD, PT, OT, SLP, RN, CM, TR, Pharmacy, SW  Financial Services:  Field Seismologist Utilized: Private Insurance [employer]IC[employer] / [facility] MEDICARE  Choices offered to/list presented to: Patient/family  Follow-up services arranged:  Home Health, DME Home Health Agency: [facility] for PT/OT    DME : 3-1 BSC and Rollator    Patient response to transportation need: Is the patient able to respond to transportation needs?: Yes In the past 12 months, has lack of transportation kept you from medical appointments or from getting medications?: No In the past 12 months, has lack of transportation kept you from meetings, work, or from getting things needed for daily living?: No   Patient/Family verbalized understanding of follow-up arrangements:  Yes  Individual responsible for coordination of the follow-up plan: Family  Confirmed correct DME delivered: Tracy  Baker Baker    Comments (or additional information): Patient to discharge hom ewioth daughter via personal transport. DME rollator and 3-1 commode ordered via [employer]. HH PT/OT with [facility].   Summary of Baker    Date/Time Discharge Planning CSW  03/18/24 1207 Home vs SNF. Daughter, Tracy to plan for family education per previous conversation. HD set up at [facility] on 03/27/24, for a TTS, 1230 pm chair time. Awaiting therapy follow-up recommendations. DS  03/12/24 1447 Plans to discharge home alone with limited family support. Will await follow-up therapy recommendations. DS  03/11/24 1357 Plans to discharge home alone with limited family support. Will await follow-up therapy recommendations. DS       Tracy  Baker

## 2024-04-02 NOTE — Progress Notes (Signed)
 Physical Therapy Discharge Summary   Patient Details  Name: Tracy Baker MRN: [id number] Date of Birth: [birth year]   Date of Discharge from PT service:April 02, 2024   Patient has met 8 of 8 long term goals due to improved activity tolerance, improved balance, improved postural control, increased strength, decreased pain, ability to compensate for deficits, improved awareness, and improved coordination.  Patient to discharge at an ambulatory level Supervision using rollator. Patient's care partner unavailable to provide the necessary physical assistance at discharge. Pt is unable to have 24/7 supervision initially upon D/C and family ultimately requesting SNF placement. However, current recommendations are for HHPT services.    All goals met   Recommendation:  Patient will benefit from ongoing skilled PT services in home health setting to continue to advance safe functional mobility, address ongoing impairments in transfers, generalized strengthening and endurance, dynamic standing balance/coordination, ambulation, and to minimize fall risk.   Equipment: Rollator   Reasons for discharge: treatment goals met and discharge from hospital   Patient/family agrees with progress made and goals achieved: Yes   PT Discharge Precautions/Restrictions Precautions Precautions: Fall Precaution/Restrictions Comments: sternal precautions lifted 1/19 Restrictions Weight Bearing Restrictions Per Provider Order: No Pain Interference Pain Interference Pain Effect on Sleep: 1. Rarely or not at all Pain Interference with Therapy Activities: 1. Rarely or not at all Pain Interference with Day-to-Day Activities: 1. Rarely or not at all Cognition Overall Cognitive Status: Within Functional Limits for tasks assessed Arousal/Alertness: Awake/alert Orientation Level: Oriented X4 Memory: Appears intact Awareness: Appears intact Problem Solving: Appears intact Safety/Judgment: Appears intact Comments:  anxious; but much improved Sensation Sensation Light Touch: Appears Intact Hot/Cold: Not tested Proprioception: Appears Intact Stereognosis: Not tested Coordination Gross Motor Movements are Fluid and Coordinated: Yes Fine Motor Movements are Fluid and Coordinated: Not tested Coordination and Movement Description: limited by global weakness/deconditioning and fatigue Heel Shin Test: decreased ROM bilaterally Motor  Motor Motor: Within Functional Limits  Mobility Bed Mobility Bed Mobility: Rolling Right;Rolling Left;Sit to Supine;Supine to Sit Rolling Right: Independent with assistive device Rolling Left: Independent with assistive device Supine to Sit: Independent with assistive device Sit to Supine: Independent with assistive device Transfers Transfers: Sit to Stand;Stand to Sit;Stand Pivot Transfers Sit to Stand: Independent with assistive device Stand to Sit: Independent with assistive device Stand Pivot Transfers: Independent with assistive device Transfer (Assistive device): Rollator Locomotion  Gait Ambulation: Yes Gait Assistance: Supervision/Verbal cueing Gait Distance (Feet): 190 Feet Assistive device: Rollator Gait Assistance Details: Verbal cues for precautions/safety Gait Assistance Details: occasional cues for rollator safety Gait Gait: Yes Gait Pattern: Impaired Gait Pattern: Poor foot clearance - left;Poor foot clearance - right;Narrow base of support Gait velocity: decreased Stairs / Additional Locomotion Stairs: Yes Stairs Assistance: Contact Guard/Touching assist Stair Management Technique: One rail Left Number of Stairs: 4 Height of Stairs: 6 Ramp: Contact Guard/touching assist (rollator) Pick up small object from the floor assist level: Contact Guard/Touching assist Pick up small object from the floor assistive device: reacher and rollator Wheelchair Mobility Wheelchair Mobility: No  Trunk/Postural Assessment  Cervical Assessment Cervical  Assessment: Exceptions to [facility] (forward head) Thoracic Assessment Thoracic Assessment: Exceptions to [facility] (rounded shoulders) Lumbar Assessment Lumbar Assessment: Exceptions to [facility] (flexible posterior pelvic tilt) Postural Control Postural Control: Within Functional Limits  Balance Balance Balance Assessed: Yes Static Sitting Balance Static Sitting - Balance Support: Feet supported;No upper extremity supported Static Sitting - Level of Assistance: 7: Independent Dynamic Sitting Balance Dynamic Sitting - Balance Support: Feet supported;No upper extremity supported  Dynamic Sitting - Level of Assistance: 6: Modified independent (Device/Increase time) Static Standing Balance Static Standing - Balance Support: Bilateral upper extremity supported;During functional activity (rollator) Static Standing - Level of Assistance: 6: Modified independent (Device/Increase time) Dynamic Standing Balance Dynamic Standing - Balance Support: Bilateral upper extremity supported;During functional activity (rollator) Dynamic Standing - Level of Assistance: 6: Modified independent (Device/Increase time) Dynamic Standing - Comments: with transfers and short distance ambulation Extremity Assessment  RLE Assessment RLE Assessment: Exceptions to [facility] General Strength Comments: tested sitting in WC RLE Strength Right Hip Flexion: 3+/5 Right Hip ABduction: 4-/5 Right Hip ADduction: 4-/5 Right Knee Flexion: 3+/5 Right Knee Extension: 3+/5 Right Ankle Dorsiflexion: 4-/5 Right Ankle Plantar Flexion: 4-/5 LLE Assessment LLE Assessment: Exceptions to [facility] General Strength Comments: tested sitting in WC LLE Strength Left Hip Flexion: 3+/5 Left Hip ABduction: 4-/5 Left Hip ADduction: 4-/5 Left Knee Flexion: 3+/5 Left Knee Extension: 3+/5 Left Ankle Dorsiflexion: 4-/5 Left Ankle Plantar Flexion: 4-/5     [name] [name] PT, DPT 04/02/2024, 7:09 AM

## 2024-04-02 NOTE — Progress Notes (Signed)
 Patient ID: Tracy Baker, female   DOB: [birth year], 69 y.o.   MRN: [id number]  Spoke with Tracy Baker to provide an update.  Insurance has come back and denied. [employer] has denied. SW contacted [employer] to get more information about the decision - they stated that although it says in the system that it has been denied there are still 2 more steps. If they decide to deny again the family has the right to file a reconsideration.   Tracy Baker says her concern now is that she doesn't know how she's supposed to get the patient to dialysis since the car cannot move due to the driveway being frozen.   Tracy Baker states she wants to speak with Dr. Babs and someone from dialysis.  SW provided an update to the entire team.

## 2024-04-02 NOTE — Plan of Care (Signed)
" °  Problem: RH Balance Goal: LTG Patient will maintain dynamic standing with ADLs (OT) Description: LTG:  Patient will maintain dynamic standing balance with assist during activities of daily living (OT)  Outcome: Completed/Met   Problem: Sit to Stand Goal: LTG:  Patient will perform sit to stand in prep for activites of daily living with assistance level (OT) Description: LTG:  Patient will perform sit to stand in prep for activites of daily living with assistance level (OT) Outcome: Completed/Met   Problem: RH Bathing Goal: LTG Patient will bathe all body parts with assist levels (OT) Description: LTG: Patient will bathe all body parts with assist levels (OT) Outcome: Completed/Met   Problem: RH Dressing Goal: LTG Patient will perform upper body dressing (OT) Description: LTG Patient will perform upper body dressing with assist, with/without cues (OT). Outcome: Completed/Met Goal: LTG Patient will perform lower body dressing w/assist (OT) Description: LTG: Patient will perform lower body dressing with assist, with/without cues in positioning using equipment (OT) Outcome: Completed/Met   Problem: RH Toileting Goal: LTG Patient will perform toileting task (3/3 steps) with assistance level (OT) Description: LTG: Patient will perform toileting task (3/3 steps) with assistance level (OT)  Outcome: Completed/Met   Problem: RH Toilet Transfers Goal: LTG Patient will perform toilet transfers w/assist (OT) Description: LTG: Patient will perform toilet transfers with assist, with/without cues using equipment (OT) Outcome: Completed/Met   Problem: RH Memory Goal: LTG Patient will demonstrate ability for day to day recall/carry over during activities of daily living with assistance level (OT) Description: LTG:  Patient will demonstrate ability for day to day recall/carry over during activities of daily living with assistance level (OT). Outcome: Completed/Met   "

## 2024-04-02 NOTE — Plan of Care (Signed)
" °  Problem: Consults Goal: RH GENERAL PATIENT EDUCATION Description: See Patient Education module for education specifics. Outcome: Progressing Goal: Skin Care Protocol Initiated - if Braden Score 18 or less Description: If consults are not indicated, leave blank or document N/A Outcome: Progressing Goal: Nutrition Consult-if indicated Outcome: Progressing Goal: Diabetes Guidelines if Diabetic/Glucose > 140 Description: If diabetic or lab glucose is > 140 mg/dl - Initiate Diabetes/Hyperglycemia Guidelines & Document Interventions  Outcome: Progressing   Problem: RH BOWEL ELIMINATION Goal: RH STG MANAGE BOWEL WITH ASSISTANCE Description: STG Manage Bowel with mod I  Assistance. Outcome: Progressing Goal: RH STG MANAGE BOWEL W/MEDICATION W/ASSISTANCE Description: STG Manage Bowel with Medication with mod I Assistance. Outcome: Progressing   Problem: RH SKIN INTEGRITY Goal: RH STG SKIN FREE OF INFECTION/BREAKDOWN Description: Manage skin w min assist Outcome: Progressing Goal: RH STG MAINTAIN SKIN INTEGRITY WITH ASSISTANCE Description: STG Maintain Skin Integrity With Assistance. Outcome: Progressing Goal: RH STG ABLE TO PERFORM INCISION/WOUND CARE W/ASSISTANCE Description: STG Able To Perform Incision/Wound Care With min Assistance. Outcome: Progressing   Problem: RH SAFETY Goal: RH STG ADHERE TO SAFETY PRECAUTIONS W/ASSISTANCE/DEVICE Description: STG Adhere to Safety Precautions With cues Assistance/Device. Outcome: Progressing   Problem: RH PAIN MANAGEMENT Goal: RH STG PAIN MANAGED AT OR BELOW PT'S PAIN GOAL Description: Pain < 4 with prns Outcome: Progressing   Problem: RH KNOWLEDGE DEFICIT GENERAL Goal: RH STG INCREASE KNOWLEDGE OF SELF CARE AFTER HOSPITALIZATION Description: Patient and family will be able to manage care at discharge using educational resources for medication, skin care and dietary modification independently Outcome: Progressing   "

## 2024-04-02 NOTE — Progress Notes (Addendum)
 Contacted daughter per social work request. I do not have transportation services to provide her, nor information on this topic, just clinic scheduling. She expressed frustration, but was happy with the clinic in [place] that has been arranged. During this call, we went over her clinic and out-patient schedule for tomorrow. I let daughter know if after starting at [place] she wanted to transfer to [facility] to just let the clinic know and they will assist with this transfer of location, as this was one of her questions. She was agreeable to this. When asked, no further support needed or questions had for me.   Contacted [facility] at this time and asked if they have received orders. They have not, their fax has changed. Navigator has faxed over orders at this time via route through epic. Informed clinic that as of now the current plan from attending is to discharge pt tomorrow, and have pt go to her appt after d/c tomorrow. AVS has correct info on it, clinic and daughter aware of plan, care team including attending, social work, and nephrology updated. Will continue to assist as needed.   Lavanda Tate Jerkins Dialysis Navigator [phone]  Addendum 3:32pm D/c orders noted. Contacted out-pt HD clinic [facility] and informed of pt d/c and anticipated arrival to clinic tomorrow, orders have been sent. No further support is needed.

## 2024-04-02 NOTE — Discharge Summary (Incomplete)
 Physician Discharge Summary  Patient ID: Tracy Baker MRN: [id number] DOB/AGE: [birth year] 69 y.o.  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 EXAM: RIGHT ANKLE - 2 VIEW COMPARISON:  None Available. FINDINGS: Artifact from overlying sock. There is no evidence of fracture, dislocation, or joint effusion. The ankle mortise is preserved. There is no evidence of arthropathy or other focal bone abnormality. Soft tissues are unremarkable. IMPRESSION: Negative radiographs of the right ankle. Electronically Signed   By: Andrea Gasman M.D.   On: 03/14/2024 16:10   DG Chest Port 1 View Result Date: 03/04/2024 CLINICAL DATA:  Status post mitral valve replacement. Pleural effusion. EXAM: PORTABLE CHEST 1 VIEW COMPARISON:  02/22/2024 FINDINGS: Right upper extremity PICC tip overlies the upper SVC. Left-sided dialysis catheter tip overlies the lower SVC. Weighted enteric tube tip below the diaphragm not included in the field of view. Post median sternotomy with prosthetic cardiac valve. The heart is upper normal in size. Stable mild pulmonary edema. Suspected small pleural effusions. Slight increase in atelectasis in the lung bases. No pneumothorax. Overlying skin staples have been removed. IMPRESSION: 1. Stable mild pulmonary edema. Suspected small pleural effusions. 2. Slight increase in atelectasis in the lung bases. Electronically Signed   By: Andrea Gasman M.D.   On: 03/04/2024 16:00    Labs:  Basic Metabolic Panel: Recent Labs  Lab 04/01/24 1213  NA 139  K 3.6  CL 99  CO2 25  GLUCOSE 95   BUN 57*  CREATININE 3.67*  CALCIUM  9.6  PHOS 4.5    CBC: Recent Labs  Lab 04/01/24 1213  WBC 4.7  HGB 9.4*  HCT 28.4*  MCV 98.3  PLT 168    CBG: No results for input(s): GLUCAP in the last 168 hours.  Brief HPI:   Tracy Baker is a 69 y.o. female ***   Hospital Course: Tracy Baker was admitted to rehab 03/10/2024 for inpatient therapies to consist of PT, ST and OT at least three hours five days a week. Past admission physiatrist, therapy team and rehab RN have worked together to provide customized collaborative inpatient rehab.   Blood pressures were monitored on TID basis and   Diabetes has been monitored with ac/hs CBG checks and SSI was use prn for tighter BS control.    Rehab course: During patient's stay in rehab weekly team conferences were held to monitor patient's progress, set goals and discuss barriers to discharge. At admission, patient required  Tracy Baker  has had improvement in activity tolerance, balance, postural control as well as ability to compensate for deficits. Tracy Baker has had improvement in functional use Tracy Baker  and RLE/LLE as well as improvement in awareness       Discharge disposition: 06-Home-Health Care Svc  Diet: Regular.   Special Instructions: Appointment with Morna Rough, PA/TCTS on Feb 3rd  at 1:30 pm Appointment with Dr. Vu/ID on 4/24 at 9:30 am 3. No driving or strenuous activity.   Allergies as of 04/02/2024       Reactions   Codeine Anaphylaxis, Nausea And Vomiting   Bactoshield Chg [chlorhexidine  Gluconate] Dermatitis   Chlorhexidine  Dermatitis, Rash  Red Dye #40 (allura Red) Nausea Only        Medication List     STOP taking these medications    feeding supplement (PROSource TF20) liquid   megestrol  40 MG tablet Commonly known as: MEGACE    mirtazapine  30 MG tablet Commonly known as: REMERON    ondansetron  4 MG tablet Commonly known as: ZOFRAN    QUEtiapine  25 MG tablet Commonly known as: SEROQUEL         TAKE these medications    acetaminophen  325 MG tablet Commonly known as: TYLENOL  Take 2 tablets (650 mg total) by mouth every 6 (six) hours as needed for mild pain (pain score 1-3).   amiodarone  200 MG tablet Commonly known as: PACERONE  Take 1 tablet (200 mg total) by mouth daily.   antiseptic oral rinse Liqd 15 mLs by Mouth Rinse route as needed for dry mouth.   ascorbic acid  500 MG tablet Commonly known as: VITAMIN C  Take 1 tablet (500 mg total) by mouth daily with supper.   clonazePAM  0.5 MG tablet Commonly known as: KLONOPIN  Take 0.25-0.5 mg by mouth at bedtime as needed for anxiety.   Eliquis  5 MG Tabs tablet Generic drug: apixaban  Take 1 tablet (5 mg total) by mouth 2 (two) times daily.   feeding supplement Liqd Take 237 mLs by mouth 3 (three) times daily between meals. What changed: Another medication with the same name was removed. Continue taking this medication, and follow the directions you see here. Notes to patient: Use supplement of choice   fluconazole  200 MG tablet Commonly known as: Diflucan  Take 2 tablets (400 mg total) by mouth daily for 34 doses. Notes to patient: Will continue to take 400 mg for 34 days. Then decrease to once a day indefinitely per infectious disease.    Gerhardt's butt cream Crea Apply 1 Application topically 2 (two) times daily. Use desitin mixed with nystatin cream to sacrum to protect from breakdown.   guaiFENesin  600 MG 12 hr tablet Commonly known as: MUCINEX  Take 1 tablet (600 mg total) by mouth 2 (two) times daily as needed for to loosen phlegm.   hydrocerin Crea Apply 1 Application topically 2 (two) times daily.   hydrocortisone  cream 1 % Apply 1 Application topically 2 (two) times daily. Notes to patient: For neck, chest and back  for itching   melatonin 5 MG Tabs Take 1 tablet (5 mg total) by mouth at bedtime. Notes to patient: For sleep   midodrine  5 MG tablet Commonly known as: PROAMATINE  Take 1 tablet  (5 mg total) by mouth 3 (three) times daily with meals. Notes to patient: To help support blood pressure   pantoprazole  40 MG tablet Commonly known as: Protonix  Take 1 tablet (40 mg total) by mouth daily. What changed:  medication strength how much to take   sertraline  25 MG tablet Commonly known as: ZOLOFT  Take 1 tablet (25 mg total) by mouth at bedtime.   vitamin D3 25 MCG tablet Commonly known as: CHOLECALCIFEROL  Take 1 tablet (1,000 Units total) by mouth daily.   witch hazel-glycerin  pad Commonly known as: TUCKS Apply 1 Application topically as needed for itching or hemorrhoids.        Follow-up Information     McDonough, Tinnie POUR, PA-C Follow up.   Specialty: Physician Assistant Why: Call in 1-2 days for post hospital follow up (medical) Contact information: [address] [place] [address] [phone]         Lorilee Sven SQUIBB, MD. Call.   Specialty: Physical Medicine  and Rehabilitation Why: As needed Contact information: [address] [place] [address] [phone]         Lorene Lesley CROME, PA-C Follow up.   Specialties: Physician Assistant, Cardiology Why: Call in 1-2 days for post hospital follow up (cardiology) Contact information: [address] [place] [address] [phone]         Norine Manuelita LABOR, MD. Call.   Specialty: Nephrology Why: for routine follow up. Will see you at hemodialysis center Contact information: [address] [place] [address] [phone]         [facility]. Go on 04/03/2024.   Why: Please arrive to your first dialysis appointment on 1/29 at 11:45 am.  after this, you will go every tues, thurs, and sat at 12:20pm Contact information: [address] [place] [address] [phone]                 Signed: Sharlet GORMAN Schmitz 04/02/2024, 5:42 PM

## 2024-04-02 NOTE — Progress Notes (Signed)
 Patient ID: Tracy Baker, female   DOB: June 28, 1955, 69 y.o.   MRN: [id number]  Discharge held until tomorrow.

## 2024-04-02 NOTE — Progress Notes (Signed)
 [facility] NEPHROLOGY PROGRESS NOTE  Assessment/ Plan: Pt is a 69 y.o. yo female   #Acute kidney Injury: She continues to anuric and without any evidence of renal recovery.  She has been on dialysis since 02/16/2024 and is status post left IJ TDC by IR on 12/22.   -SW/renal navigator is following to arrange outpatient dialysis. Pt approved a TTS 1230 chair at [facility].  -TTS schedule, HD tomorrow.    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  Currently on fluconazole  with lifelong therapeutic duration noted.  # Hypotension: Blood pressures acceptable,  on midodrine  10 mg 3 times a day.  #  Anemia: monitor hb, start Aranesp .  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  Labs at goal  Subjective: Seen and examined at bedside.  She was moved to inpatient rehab.  Denies nausea, vomiting, chest pain, shortness of breath.  Dialysis yesterday with 1.5 L ultrafiltration, tolerated well.    Objective Vital signs in last 24 hours: Vitals:   04/01/24 1534 04/01/24 2029 04/02/24 0632 04/02/24 0633  BP: (!) 118/96 123/65 118/77   Pulse: 89 82 80   Resp: 17 18 18    Temp: 98 F (36.7 C) 98.2 F (36.8 C) 97.8 F (36.6 C)   TempSrc: Oral Oral    SpO2: 100% 99% 96%   Weight:    67.6 kg  Height:       Weight change: 1.04 kg  Intake/Output Summary (Last 24 hours) at 04/02/2024 1122 Last data filed at 04/02/2024 0900 Gross per 24 hour  Intake 720 ml  Output 1500 ml  Net -780 ml       Labs: RENAL PANEL Recent Labs  Lab 03/26/24 1501 04/01/24 1213  NA 136 139  K 4.8 3.6  CL 95* 99  CO2 27 25  GLUCOSE 116* 95  BUN 56* 57*  CREATININE 4.82* 3.67*  CALCIUM  9.6 9.6  PHOS 4.8* 4.5  ALBUMIN  3.8 3.8    Liver Function Tests: Recent Labs  Lab 03/26/24 1501 04/01/24 1213  ALBUMIN  3.8 3.8   No results for input(s): LIPASE, AMYLASE in the last 168 hours. No results for input(s): AMMONIA in the last 168  hours. CBC: Recent Labs    03/13/24 1027 03/14/24 1224 03/17/24 0622 03/24/24 0539 04/01/24 1213  HGB 12.4 12.3 10.3* 9.7* 9.4*  MCV 95.6 95.1 96.2 99.7 98.3    Cardiac Enzymes: No results for input(s): CKTOTAL, CKMB, CKMBINDEX, TROPONINI in the last 168 hours. CBG: No results for input(s): GLUCAP in the last 168 hours.   Iron Studies: No results for input(s): IRON, TIBC, TRANSFERRIN, FERRITIN in the last 72 hours. Studies/Results: No results found.  Medications: Infusions:   Scheduled Medications:  amiodarone   200 mg Oral Daily   apixaban   5 mg Oral BID   ascorbic acid   500 mg Oral Q supper   cholecalciferol   1,000 Units Oral Daily   fluconazole   400 mg Oral Q supper   Followed by   NOREEN ON 05/05/2024] fluconazole   200 mg Oral Daily   Gerhardt's butt cream   Topical BID   hydrocerin   Topical BID   hydrocortisone  cream  1 Application Topical BID   melatonin  5 mg Oral QHS   midodrine   5 mg Oral TID WC   mouth rinse  15 mL Mouth Rinse 4 times per day   pantoprazole   40 mg Oral Daily   sertraline   25 mg Oral QHS  have reviewed scheduled and prn medications.  Physical Exam: General:NAD, comfortable Heart:RRR, s1s2 nl Lungs:clear b/l, no crackle Abdomen:soft, Non-tender, non-distended Extremities:No edema Dialysis Access: Right IJ TDC in place.  Tracy Baker 04/02/2024,11:22 AM  LOS: 23 days

## 2024-04-02 NOTE — Patient Care Conference (Signed)
 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 Sven Elks Social Worker Present: Waverly Gentry, LCSW-A Nurse Present: Barnie Ronde, RN PT Present: [name], PT OT Present: Lorrayne Fritter, OT SLP Present: Rosina Downy, SLP PPS Coordinator present : Eleanor Colon, SLP     Current Status/Progress Goal Weekly Team Focus  Bowel/Bladder     Continent of bowel and bladder; ESRD: HD           Swallow/Nutrition/ Hydration               ADL's   Supervision overall   Supervision- at goal level   DC planning    Mobility   bed mobility mod I, transfers with rollator supervision, gait >120ft with rollator supervision, 4 6in steps with 1 handrail CGA   supervision  barriers: D/C plan    Communication                Safety/Cognition/ Behavioral Observations               Pain                Skin     DTI left heel    Wounds healing and not new wound noted    Wound treatment plan; foam to heels, wound is healing    Discharge Planning:  Home with daughter [name]. DME ordered and delivered: Rollator and 3-1 commode. HH PT/OT established with [facility].   Team Discussion: Patient admitted with debility post MVR with endocarditis and  AKI; now on HD M-W-F. Patient with poor appetite on TF via cortrak with appetite stimulant. Functional progress was limited by foot pain, defuse strength issues, fatigue and decreased initiation and lack of motivation; presenting similar to FTT with anoxic brain injury. Discussion of limited support at discharge and transportation to/from HD.   Patient on target to meet rehab goals: yes, patient is currently supervision overall but can be mod I in some capacity. Needs mod I assist for transfers using a rollator, able to ambulate using a rollator  up to 190' and manages 4 steps with rails and CGA.    *See Care Plan and progress notes for long and short-term goals.   Revisions to Treatment Plan:  Psych consult   Teaching Needs: Safety, medications, dietary modification, transfers, toileting, etc.  Current Barriers to Discharge: Decreased caregiver support, Home enviroment access/layout, and Hemodialysis  Possible Resolutions to Barriers: Family education completed HH follow up services HD in [place] until returns to own home; then transfer to [facility] Summary Current Status: rash on chest, depression, ESRD, hypotension, fungal endocarditis, insomnia, rash  Barriers to Discharge: Medical stability  Barriers to Discharge Comments: rash on chest, depression, ESRD, hypotension,  fungal endocarditis, insomnia, rash Possible Resolutions to [employer]: continue sarna lotion prn, continue zoloft , continue HD, conitnue midodrine  37m TID, continue melatonin, conitnue hydrocortisone  and sarna   Continued Need for Acute Rehabilitation Level of Care: The patient requires daily medical management by a physician with specialized training in physical medicine and rehabilitation for the following reasons: Direction of a multidisciplinary physical rehabilitation program to maximize functional independence : Yes Medical management of patient stability for increased activity during  participation in an intensive rehabilitation regime.: Yes Analysis of laboratory values and/or radiology reports with any subsequent need for medication adjustment and/or medical intervention. : Yes   I attest that I was present, lead the team conference, and concur with the assessment and plan of the team.   Fredericka Sober B 04/03/2024, 9:11 AM

## 2024-04-02 NOTE — Progress Notes (Signed)
 Occupational Therapy Discharge Summary  Patient Details  Name: Tracy Baker MRN: [id number] Date of Birth: 09/23/1955  Date of Discharge from OT service:April 02, 2024   Patient has met 8 of 8 long term goals due to improved activity tolerance, improved balance, improved awareness, and improved coordination.  Patient to discharge at overall Supervision level.  Patient's care partner unavailable to provide the necessary physical assistance at discharge. 24/7 supervision has been recommended since admission, which caregivers were aware, with family education arranged multiple times but OT unable to complete due to no shows. Family attempted SNF placement however was not approved due to pt progress, CLOF and at goal level. DC to home with 24/7 supervision continued to be recommended.  All goals met  Recommendation:  Patient will benefit from ongoing skilled OT services in home health setting to continue to advance functional skills in the area of BADL, iADL, Vocation, and Reduce care partner burden.  Equipment: BSC  Reasons for discharge: treatment goals met  Patient/family agrees with progress made and goals achieved: Yes  OT Discharge Precautions/Restrictions  Precautions Precautions: Fall Precaution/Restrictions Comments: sternal precautions lifted 1/19 Restrictions Weight Bearing Restrictions Per Provider Order: No ADL ADL Eating: Independent Where Assessed-Eating: Wheelchair Grooming: Modified independent Where Assessed-Grooming: Standing at sink Upper Body Bathing: Modified independent Where Assessed-Upper Body Bathing: Sitting at sink Lower Body Bathing: Modified independent Where Assessed-Lower Body Bathing: Standing at sink, Sitting at sink Upper Body Dressing: Modified independent (Device) Where Assessed-Upper Body Dressing: Sitting at sink Lower Body Dressing: Modified independent Where Assessed-Lower Body Dressing: Sitting at sink, Standing at sink Toileting:  Supervision/safety Where Assessed-Toileting: Toilet, Bedside Commode Toilet Transfer: Distant supervision Toilet Transfer Method: Proofreader: Raised toilet seat Tub/Shower Transfer: Not assessed Tub/Shower Transfer Method: Unable to assess Film/video Editor: Not assessed Film/video Editor Method: Unable to assess ADL Comments: No shower at DC due to HD therefore did not assess shower transfers Vision Baseline Vision/History: 1 Wears glasses Patient Visual Report: No change from baseline Vision Assessment?: No apparent visual deficits Perception  Perception: Within Functional Limits Praxis Praxis: WFL Cognition Cognition Overall Cognitive Status: Within Functional Limits for tasks assessed Arousal/Alertness: Awake/alert Orientation Level: Person;Place;Situation Person: Oriented Place: Oriented Situation: Oriented Memory: Appears intact Awareness: Appears intact Problem Solving: Appears intact Safety/Judgment: Appears intact Comments: anxious; but much improved Brief Interview for Mental Status (BIMS) Repetition of Three Words (First Attempt): 3 Temporal Orientation: Year: Correct Temporal Orientation: Month: Accurate within 5 days Temporal Orientation: Day: Incorrect Recall: Sock: Yes, no cue required Recall: Blue: Yes, no cue required Recall: Bed: Yes, no cue required BIMS Summary Score: 14 Sensation Sensation Light Touch: Appears Intact Hot/Cold: Not tested Proprioception: Appears Intact Stereognosis: Not tested Coordination Gross Motor Movements are Fluid and Coordinated: Yes Fine Motor Movements are Fluid and Coordinated: Not tested Coordination and Movement Description: limited by global weakness/deconditioning and fatigue Finger Nose Finger Test: Tremors bilaterally Motor  Motor Motor: Within Functional Limits Mobility  Bed Mobility Bed Mobility: Rolling Right;Rolling Left;Sit to Supine;Supine to Sit Rolling  Right: Independent with assistive device Rolling Left: Independent with assistive device Supine to Sit: Independent with assistive device Sit to Supine: Independent with assistive device Transfers Sit to Stand: Independent with assistive device Stand to Sit: Independent with assistive device  Trunk/Postural Assessment  Cervical Assessment Cervical Assessment: Exceptions to [facility] (forward head) Thoracic Assessment Thoracic Assessment: Exceptions to [facility] (rounded shoulders) Lumbar Assessment Lumbar Assessment: Exceptions to [facility] (flexible posterior pelvic tilt) Postural Control Postural Control: Within Functional Limits  Balance Balance Balance Assessed: Yes Static Sitting Balance Static Sitting - Balance Support: Feet supported;No upper extremity supported Static Sitting - Level of Assistance: 7: Independent Dynamic Sitting Balance Dynamic Sitting - Balance Support: Feet supported;No upper extremity supported Dynamic Sitting - Level of Assistance: 6: Modified independent (Device/Increase time) Static Standing Balance Static Standing - Balance Support: Bilateral upper extremity supported;During functional activity (rollator) Static Standing - Level of Assistance: 6: Modified independent (Device/Increase time) Dynamic Standing Balance Dynamic Standing - Balance Support: Bilateral upper extremity supported;During functional activity (rollator) Dynamic Standing - Level of Assistance: 6: Modified independent (Device/Increase time) Dynamic Standing - Comments: with transfers and short distance ambulation Extremity/Trunk Assessment RUE Assessment RUE Assessment: Exceptions to [facility] General Strength Comments: 4-/5 grossly LUE Assessment LUE Assessment: Exceptions to [facility] General Strength Comments: 4-/5 grossly   Tracy FORBES Fritter, MS, OTR/L  04/02/2024, 12:05 PM

## 2024-04-02 NOTE — Progress Notes (Signed)
 Physical Therapy Session Note  Patient Details  Name: Tracy Baker MRN: [id number] Date of Birth: [birth year]  Today's Date: 04/02/2024 PT Individual Time: 9152-9085 PT Individual Time Calculation (min): 27 min   Short Term Goals: Week 2:  PT Short Term Goal 1 (Week 2): STG=LTG due to LOS Week 3:  PT Short Term Goal 1 (Week 3): STG=LTG due to entended LOS  Skilled Therapeutic Interventions/Progress Updates:   Received pt semi-reclined in bed with MD present for morning rounds and RN administering medications. Pt agreeable to PT treatment and denied any pain during session. Session with emphasis on discharge planning, functional mobility/transfers, toileting, and dressing. Pt reported that her daughter/son plan to appeal discharge. Discussed that if appeal is denied, pt will be responsible for paying OOP cost of ~$3,500/day. Also discussed different HH agencies and requested list from CSW. Pt performed bed mobility with HOB elevated and use of bedrails with mod I. Pt performed all transfers with rollator and mod I throughout session. Pt ambulated in/out of bathroom with rollator and mod I. Pt continent of bowel and bladder and performed hygiene management without assist. Sat in WC at sink and performed hand hygiene and brushed teeth with setup assist. Provided pt with scrub pants, mesh underwear, and clean gown. Pt left seated in WC at sink dressing and grooming with all needs within reach. NT notified of pt's current status due to therapist's time restrictions.   Therapy Documentation Precautions:  Precautions Precautions: Fall Recall of Precautions/Restrictions: Impaired Precaution/Restrictions Comments: sternal precautions lifted 1/19 Restrictions Weight Bearing Restrictions Per Provider Order: No Other Position/Activity Restrictions: Cardiac sternal precautions  Therapy/Group: Individual Therapy Tracy Baker Tracy Baker PT, DPT 04/02/2024, 6:51 AM

## 2024-04-02 NOTE — Progress Notes (Signed)
 Inpatient Rehabilitation Discharge Medication Review by a Pharmacist   A complete drug regimen review was completed for this patient to identify any potential clinically significant medication issues.   High Risk Drug Classes Is patient taking? Indication by Medication  Antipsychotic No    Anticoagulant Yes Apixaban : AFib  Antibiotic Yes  Fluconazole : Fungal endocarditis (indefinite therapy)  Opioid No    Antiplatelet No    Hypoglycemics/insulin  No    Vasoactive Medication Yes Midodrine : hypotension Amiodarone : AFib  Chemotherapy No    Other Yes Melatonin: sleep Clonazepam : anxiety Protonix : GERD Sertraline : mood      Type of Medication Issue Identified Description of Issue Recommendation(s)  Drug Interaction(s) (clinically significant)     Duplicate Therapy     Allergy     No Medication Administration End Date  Noted plan for Fluconazole  400mg  daily through 05/04/24 followed by 200mg  daily indefinitely.    Incorrect Dose     Additional Drug Therapy Needed     Significant med changes from prior encounter (inform family/care partners about these prior to discharge). Lovastatin  PTA discontinued during inpatient admission. No CAD.  Seroquel  discontinued Restart or discontinue as appropriate. Communicate medication changes with patient/family at discharge  Other       Clinically significant medication issues were identified that warrant physician communication and completion of prescribed/recommended actions by midnight of the next day:  No   Time spent performing this drug regimen review (minutes): 30   Thank you for allowing pharmacy to be a part of this patients care.   Bascom JAYSON Louder, PharmD 04/02/2024 9:36 AM     **Pharmacist phone directory can be found on [email or web address] listed under [facility]**

## 2024-04-02 NOTE — Progress Notes (Signed)
 Occupational Therapy Session Note  Patient Details  Name: Tracy Baker MRN: [id number] Date of Birth: 01/19/1956  Today's Date: 04/02/2024 OT Individual Time: 8897-8872 OT Individual Time Calculation (min): 25 min    Short Term Goals: Week 3:  OT Short Term Goal 1 (Week 3): STG = LTG (Continue progressing towards LTGs)  Skilled Therapeutic Interventions/Progress Updates:  Skilled OT intervention completed with focus on DC planning and education. Pt received upright in bed, agreeable to session. No pain reported.  Completed BIMS assessment in prep for DC; pt scored 14/15 with confusion only for day of week but otherwise memory intact.  Provided education on the following in prep for DC: -recommended pt to stay on main level of daughters home vs doing 15 steps without hand rails; pt in agreement and talked through ability to sleep in recliner until she can transfer home where her adjustable bed is already set up -hair washing via hair washing tray- we pulled options up on her phone on [facility] for self-purchase if desired and discussed alternative ways to wash hair via hair dresser if preferred -sponge bathing recommendation vs full bath due to HD port, but discussed importance of preventing HD cath from getting wet if she showers from waist down   Pt remained upright in bed, with bed alarm on/activated, and with all needs in reach at end of session.   Therapy Documentation Precautions:  Precautions Precautions: Fall Recall of Precautions/Restrictions: Impaired Precaution/Restrictions Comments: sternal precautions lifted 1/19 Restrictions Weight Bearing Restrictions Per Provider Order: No    Therapy/Group: Individual Therapy  Tracy FORBES Fritter, MS, OTR/L  04/02/2024, 11:42 AM

## 2024-04-02 NOTE — Progress Notes (Signed)
 Patient ID: Tracy Baker, female   DOB: 22-Mar-1955, 69 y.o.   MRN: [id number]  [name] reports that they may no need the 3-1 commode. SW updated Jermaine with [employer] and he will have a tech retrieve the item. It was left at the nurses station.

## 2024-04-02 NOTE — Progress Notes (Signed)
 Nutrition Follow Up  DOCUMENTATION CODES:   Severe malnutrition in context of chronic illness  INTERVENTION:   Encouraged continued adequate PO intake Double protein portions to meal trays  Continue Magic cup TID with meals, each supplement provides 290 kcal and 9 grams of protein  Pt refuses most ONS (RD team has previously tried Ensure, Glucerna, [employer], [employer], Juven and [employer])  May need bowel regimen ordered, went 6 days without bowel movement before today and reports feeling of constipation following HD treatments  NUTRITION DIAGNOSIS:   Severe Malnutrition related to chronic illness as evidenced by moderate muscle depletion, severe muscle depletion, moderate fat depletion. Remains applicable  GOAL:   Patient will meet greater than or equal to 90% of their needs Progressing  MONITOR:   PO intake, Supplement acceptance, Labs  REASON FOR ASSESSMENT:   Consult Assessment of nutrition requirement/status, Enteral/tube feeding initiation and management, Poor PO  ASSESSMENT:   69 yo female admitted with hx of Calbicans candidemia, nephrolithiasis s/p ureteral stent and lithotripsy with recent admission for MV endocarditis requiring MVR, post-op course complicated by shock post-op, junctional bradycardia, afib with RVR, and pneumonia requiring ECMO. Hospital course complicated by poor PO intake requiring Cortrak placement, AKI requirining CRRT then transitioned to HD, and fatigue. Admitted to CIR to work on debility.  Pt continues to have good appetite, eating on average 84% of meals. Pt reports feeling of constipation following HD treatments, currently does not have bowel regimen ordered. Had bowel movement today 1/28 but had not had one before that since 1/22. Pt's CIR stay has been extended a few days to continue working on progress. Pt reports she is working well with therapy.   Last HD treatment yesterday, 1500 ml removed and pt tolerated  treatment well. Plan for next session tomorrow.   Weight continues to remain stable at this time.   Average Meal Completion: 1/6-1/8: 31% average intake x 4 recorded meals 1/10-1/13: 88% average intake x 8 recorded meals 1/19-1/21: 91% average intake x 7 recorded meals 1/25-1/28: 84% average intake x 8 recorded meals  Nutritionally Relevant Medications: Vitamin C  500 mg daily Vitamin D3 1000 units daily Midodrine  TID Protonix  Zoloft    Labs reviewed: BUN 57 Cr 3.67 Phos 4.5 (WNL) Potassium 3.6 (WNL)  Admit weight: 62 kg Current weight: 67.6 kg  HD Plan:  TTS schedule: 3.5h, no heparin . TDC.  Last HD: 1/27 UF removed: no UF goal Next HD: 1/29 Has chair schedule outpatient TTS 12:30pm starting next week   Diet Order:   Diet Order             Diet regular Room service appropriate? Yes; Fluid consistency: Thin  Diet effective now                   EDUCATION NEEDS:   Education needs have been addressed  Skin:  Skin Integrity Issues:: Stage II Stage II: sacrum Incisions: chest  Last BM:  1/28  Height:   Ht Readings from Last 1 Encounters:  03/10/24 5' 4 (1.626 m)    Weight:   Wt Readings from Last 1 Encounters:  04/02/24 67.6 kg    Ideal Body Weight:  54.55 kg  BMI:  Body mass index is 25.58 kg/m.  Estimated Nutritional Needs:   Kcal:  1900-2100  Protein:  85-105g  Fluid:  2L    Josette Glance, MS, RDN, LDN Clinical Dietitian I Please reach out via secure chat

## 2024-04-03 DIAGNOSIS — F32A Depression, unspecified: Secondary | ICD-10-CM | POA: Insufficient documentation

## 2024-04-03 NOTE — Progress Notes (Addendum)
 Patient ID: Tracy Baker, female   DOB: [birth year], 69 y.o.   MRN: [id number]  Mailed handicap placard application to [address], [place], [place] [address]

## 2024-04-03 NOTE — Progress Notes (Addendum)
 Patient ID: Tracy Baker, female   DOB: 1955-07-24, 69 y.o.   MRN: [id number]  [facility] contacted SW to inform her that the appeal was overturned and the patient is now able to go to [facility] if they have a bed available and if she can admit from home. A voicemail was left with [facility] to see if they had a bed available and if the patient could admit from home. Provided an update to the family to see if they are still interested in going. They have until February 2nd to make the decision to go. Clinicals will be faxed over as soon as an update is provided. Team aware. Ref#: [id number] Plan: [id number]

## 2024-04-04 ENCOUNTER — Other Ambulatory Visit: Payer: Self-pay

## 2024-04-04 ENCOUNTER — Emergency Department

## 2024-04-04 ENCOUNTER — Emergency Department
Admission: EM | Admit: 2024-04-04 | Discharge: 2024-04-04 | Disposition: A | Discharge status: Home / Self Care | Attending: Emergency Medicine | Admitting: Emergency Medicine

## 2024-04-04 DIAGNOSIS — S0003XA Contusion of scalp, initial encounter: Secondary | ICD-10-CM

## 2024-04-04 DIAGNOSIS — N186 End stage renal disease: Secondary | ICD-10-CM | POA: Diagnosis not present

## 2024-04-04 DIAGNOSIS — Z9889 Other specified postprocedural states: Secondary | ICD-10-CM

## 2024-04-04 DIAGNOSIS — M25522 Pain in left elbow: Secondary | ICD-10-CM | POA: Insufficient documentation

## 2024-04-04 DIAGNOSIS — W108XXA Fall (on) (from) other stairs and steps, initial encounter: Secondary | ICD-10-CM | POA: Diagnosis not present

## 2024-04-04 DIAGNOSIS — E876 Hypokalemia: Secondary | ICD-10-CM | POA: Diagnosis not present

## 2024-04-04 DIAGNOSIS — Z992 Dependence on renal dialysis: Secondary | ICD-10-CM | POA: Diagnosis not present

## 2024-04-04 DIAGNOSIS — S0181XA Laceration without foreign body of other part of head, initial encounter: Secondary | ICD-10-CM | POA: Diagnosis not present

## 2024-04-04 DIAGNOSIS — W19XXXA Unspecified fall, initial encounter: Secondary | ICD-10-CM

## 2024-04-04 DIAGNOSIS — I4891 Unspecified atrial fibrillation: Secondary | ICD-10-CM | POA: Insufficient documentation

## 2024-04-04 DIAGNOSIS — S61512A Laceration without foreign body of left wrist, initial encounter: Secondary | ICD-10-CM | POA: Insufficient documentation

## 2024-04-04 DIAGNOSIS — Z7901 Long term (current) use of anticoagulants: Secondary | ICD-10-CM | POA: Diagnosis not present

## 2024-04-04 DIAGNOSIS — S0990XA Unspecified injury of head, initial encounter: Secondary | ICD-10-CM | POA: Diagnosis present

## 2024-04-04 LAB — COMPREHENSIVE METABOLIC PANEL WITH GFR
ALT: 26 U/L (ref 0–44)
AST: 26 U/L (ref 15–41)
Albumin: 4.2 g/dL (ref 3.5–5.0)
Alkaline Phosphatase: 153 U/L — ABNORMAL HIGH (ref 38–126)
Anion gap: 11 (ref 5–15)
BUN: 5 mg/dL — ABNORMAL LOW (ref 8–23)
CO2: 31 mmol/L (ref 22–32)
Calcium: 8.5 mg/dL — ABNORMAL LOW (ref 8.9–10.3)
Chloride: 96 mmol/L — ABNORMAL LOW (ref 98–111)
Creatinine, Ser: 0.93 mg/dL (ref 0.44–1.00)
GFR, Estimated: >60 mL/min
Glucose, Bld: 97 mg/dL (ref 70–99)
Potassium: 2.9 mmol/L — ABNORMAL LOW (ref 3.5–5.1)
Sodium: 139 mmol/L (ref 135–145)
Total Bilirubin: 0.7 mg/dL (ref 0.0–1.2)
Total Protein: 7.2 g/dL (ref 6.5–8.1)

## 2024-04-04 LAB — CBC
HCT: 31.4 % — ABNORMAL LOW (ref 36.0–46.0)
Hemoglobin: 10.4 g/dL — ABNORMAL LOW (ref 12.0–15.0)
MCH: 32.1 pg (ref 26.0–34.0)
MCHC: 33.1 g/dL (ref 30.0–36.0)
MCV: 96.9 fL (ref 80.0–100.0)
Platelets: 155 10*3/uL (ref 150–400)
RBC: 3.24 MIL/uL — ABNORMAL LOW (ref 3.87–5.11)
RDW: 15.8 % — ABNORMAL HIGH (ref 11.5–15.5)
WBC: 4.5 10*3/uL (ref 4.0–10.5)
nRBC: 0 % (ref 0.0–0.2)

## 2024-04-04 NOTE — Progress Notes (Signed)
 cxr

## 2024-04-04 NOTE — ED Notes (Signed)
 Patient coming [facility] from dialysis, 2nd day of tx, 4 hrs yesterday, 3hrs today. Patient d/c'd from hospital a couple days ago from 105 day stay. Patient was standing on scale to weigh, tripped and fell, has small skin tear above left eye and skin tear to left wrist. Patient is on eliquis . Denies LOC, AO X 4. Pain at tear/lac sites; denies other complaints.   130/68, 98%, HR 95.

## 2024-04-04 NOTE — ED Triage Notes (Signed)
 Pt arrives via [facility] from dialysis where she was being weighed after completing treatment, when the pt turned around to grab onto the walker, pt's legs gave out, they missed the walker and fell and hit their head above the left eye and has a hematoma and skin tear, also a skin tear on the top of their left wrist. Pt is on thinners. Pt is A&Ox4 during triage.

## 2024-04-07 NOTE — Progress Notes (Unsigned)
" ° °   °  [address] [place] [address]             [phone]    Patient canceled  "

## 2024-04-08 ENCOUNTER — Ambulatory Visit: Payer: Self-pay

## 2024-04-08 ENCOUNTER — Other Ambulatory Visit (HOSPITAL_COMMUNITY)

## 2024-04-29 ENCOUNTER — Inpatient Hospital Stay: Payer: Self-pay | Admitting: Internal Medicine

## 2024-06-12 ENCOUNTER — Ambulatory Visit: Admitting: Physician Assistant
